# Patient Record
Sex: Male | Born: 1960
Health system: Southern US, Community
[De-identification: ages and names within clinical notes are randomized; demographics above are authoritative.]

## PROBLEM LIST (undated history)

## (undated) DIAGNOSIS — S060X9A Concussion with loss of consciousness of unspecified duration, initial encounter: Secondary | ICD-10-CM

## (undated) DIAGNOSIS — G43909 Migraine, unspecified, not intractable, without status migrainosus: Secondary | ICD-10-CM

## (undated) DIAGNOSIS — N184 Chronic kidney disease, stage 4 (severe): Secondary | ICD-10-CM

## (undated) DIAGNOSIS — N2581 Secondary hyperparathyroidism of renal origin: Secondary | ICD-10-CM

## (undated) DIAGNOSIS — K200 Eosinophilic esophagitis: Secondary | ICD-10-CM

## (undated) DIAGNOSIS — K259 Gastric ulcer, unspecified as acute or chronic, without hemorrhage or perforation: Secondary | ICD-10-CM

## (undated) DIAGNOSIS — N186 End stage renal disease: Secondary | ICD-10-CM

## (undated) DIAGNOSIS — E1122 Type 2 diabetes mellitus with diabetic chronic kidney disease: Secondary | ICD-10-CM

## (undated) DIAGNOSIS — N179 Acute kidney failure, unspecified: Secondary | ICD-10-CM

## (undated) DIAGNOSIS — Z992 Dependence on renal dialysis: Secondary | ICD-10-CM

## (undated) DIAGNOSIS — I4891 Unspecified atrial fibrillation: Secondary | ICD-10-CM

## (undated) DIAGNOSIS — K7460 Unspecified cirrhosis of liver: Secondary | ICD-10-CM

## (undated) DIAGNOSIS — S060XAA Concussion with loss of consciousness status unknown, initial encounter: Secondary | ICD-10-CM

## (undated) DIAGNOSIS — K219 Gastro-esophageal reflux disease without esophagitis: Secondary | ICD-10-CM

## (undated) DIAGNOSIS — Z87442 Personal history of urinary calculi: Secondary | ICD-10-CM

## (undated) DIAGNOSIS — I482 Chronic atrial fibrillation, unspecified: Secondary | ICD-10-CM

## (undated) DIAGNOSIS — Z8711 Personal history of peptic ulcer disease: Secondary | ICD-10-CM

## (undated) DIAGNOSIS — R1319 Other dysphagia: Secondary | ICD-10-CM

## (undated) DIAGNOSIS — I499 Cardiac arrhythmia, unspecified: Secondary | ICD-10-CM

## (undated) DIAGNOSIS — M1990 Unspecified osteoarthritis, unspecified site: Secondary | ICD-10-CM

## (undated) DIAGNOSIS — D649 Anemia, unspecified: Secondary | ICD-10-CM

## (undated) HISTORY — PX: NEPHROLITHOTOMY: SUR881

## (undated) HISTORY — PX: FINGER SURGERY: SHX640

## (undated) HISTORY — DX: Eosinophilic esophagitis: K20.0

## (undated) HISTORY — PX: HERNIA REPAIR: SHX51

## (undated) HISTORY — DX: Type 2 diabetes mellitus with diabetic chronic kidney disease: E11.22

## (undated) HISTORY — DX: Unspecified cirrhosis of liver: K74.60

## (undated) HISTORY — PX: LITHOTRIPSY: SUR834

## (undated) HISTORY — DX: Gastric ulcer, unspecified as acute or chronic, without hemorrhage or perforation: K25.9

## (undated) HISTORY — PX: CYSTOSCOPY: SUR368

## (undated) HISTORY — PX: KNEE ARTHROSCOPY: SUR90

## (undated) HISTORY — DX: Unspecified atrial fibrillation: I48.91

## (undated) HISTORY — PX: DENTAL SURGERY: SHX609

## (undated) HISTORY — DX: End stage renal disease: N18.6

## (undated) HISTORY — DX: End stage renal disease: Z99.2

## (undated) HISTORY — DX: Type 2 diabetes mellitus with diabetic chronic kidney disease: N18.4

## (undated) HISTORY — DX: Personal history of peptic ulcer disease: Z87.11

## (undated) HISTORY — DX: Gastro-esophageal reflux disease without esophagitis: K21.9

## (undated) HISTORY — PX: TONSILLECTOMY: SUR1361

## (undated) HISTORY — DX: Secondary hyperparathyroidism of renal origin: N25.81

## (undated) HISTORY — DX: Acute kidney failure, unspecified: N17.9

---

## 1998-03-31 ENCOUNTER — Emergency Department (HOSPITAL_COMMUNITY): Admission: EM | Admit: 1998-03-31 | Discharge: 1998-03-31 | Payer: Self-pay | Admitting: Emergency Medicine

## 2000-04-26 ENCOUNTER — Encounter: Payer: Self-pay | Admitting: Orthopedic Surgery

## 2000-04-26 ENCOUNTER — Ambulatory Visit (HOSPITAL_COMMUNITY): Admission: RE | Admit: 2000-04-26 | Discharge: 2000-04-26 | Payer: Self-pay | Admitting: Orthopedic Surgery

## 2001-09-08 ENCOUNTER — Encounter: Admission: RE | Admit: 2001-09-08 | Discharge: 2001-09-08 | Payer: Self-pay | Admitting: *Deleted

## 2001-09-08 ENCOUNTER — Encounter: Payer: Self-pay | Admitting: *Deleted

## 2001-09-29 ENCOUNTER — Ambulatory Visit (HOSPITAL_COMMUNITY): Admission: RE | Admit: 2001-09-29 | Discharge: 2001-09-29 | Payer: Self-pay | Admitting: Orthopedic Surgery

## 2001-09-29 ENCOUNTER — Encounter: Payer: Self-pay | Admitting: Orthopedic Surgery

## 2005-03-19 ENCOUNTER — Ambulatory Visit (HOSPITAL_COMMUNITY): Admission: RE | Admit: 2005-03-19 | Discharge: 2005-03-19 | Payer: Self-pay | Admitting: Urology

## 2006-01-18 ENCOUNTER — Ambulatory Visit: Payer: Self-pay | Admitting: Gastroenterology

## 2006-01-21 ENCOUNTER — Ambulatory Visit: Payer: Self-pay | Admitting: Gastroenterology

## 2006-01-21 ENCOUNTER — Emergency Department (HOSPITAL_COMMUNITY): Admission: EM | Admit: 2006-01-21 | Discharge: 2006-01-21 | Payer: Self-pay | Admitting: Emergency Medicine

## 2010-06-11 ENCOUNTER — Emergency Department (HOSPITAL_COMMUNITY): Admission: EM | Admit: 2010-06-11 | Discharge: 2010-06-11 | Payer: Self-pay | Admitting: Emergency Medicine

## 2010-07-08 ENCOUNTER — Ambulatory Visit: Payer: Self-pay | Admitting: Cardiology

## 2010-07-08 ENCOUNTER — Encounter: Payer: Self-pay | Admitting: Physician Assistant

## 2010-07-08 DIAGNOSIS — F341 Dysthymic disorder: Secondary | ICD-10-CM

## 2010-07-08 DIAGNOSIS — R002 Palpitations: Secondary | ICD-10-CM

## 2010-07-24 ENCOUNTER — Encounter: Payer: Self-pay | Admitting: Cardiology

## 2010-07-24 ENCOUNTER — Ambulatory Visit: Payer: Self-pay | Admitting: Cardiology

## 2010-07-24 ENCOUNTER — Ambulatory Visit (HOSPITAL_COMMUNITY): Admission: RE | Admit: 2010-07-24 | Discharge: 2010-07-24 | Payer: Self-pay | Admitting: Cardiology

## 2010-07-24 ENCOUNTER — Ambulatory Visit: Payer: Self-pay

## 2010-08-01 ENCOUNTER — Encounter: Payer: Self-pay | Admitting: Cardiology

## 2010-08-01 DIAGNOSIS — I10 Essential (primary) hypertension: Secondary | ICD-10-CM | POA: Insufficient documentation

## 2010-08-04 ENCOUNTER — Ambulatory Visit: Payer: Self-pay | Admitting: Cardiology

## 2010-12-23 NOTE — Assessment & Plan Note (Signed)
Summary: [name]/PALP   Visit Type:  Initial Consult  CC:  palpitations.  History of Present Illness: This is a 50 year old white male patient who went to the emergency room complaining of palpitations. His potassium was 3.0 but this was not replaced. Cardiac enzymes were negative and he was placed on metoprolol.  The patient states he is under an unknown amount of stress. He is going through divorce and his parents help has not been well. He says his heart began to pound and race and it was happening at night. He is actually sleeping better and has not had as much pounding. He does drink 3-4 sodas a day with caffeine. He does not have a history of hypertension diabetes family history of coronary artery disease and he is a nonsmoker. His blood pressure is up a little bit in the office today.  The patient denies any exertional chest pain dyspnea dyspnea on exertion dizziness or presyncope. He does not exercise regular. He said when he went to the hospital he had some tightness with a pounding heart but this was unusual.  The patient stopped taking the metoprolol 2 weeks after was initiated because he said it was not making any difference.  Current Medications (verified): 1)  Patient Does Not Take Any Medications  Allergies (verified): No Known Drug Allergies  Past History:  Past Medical History: Last updated: 07/03/2010 1.  Extensive kidney stones. He has undergone two serial surgical      extractions of stone at [facility] in the summer of 2006. Status      post many lithotripsies. His urologist in [place] is Dr. Gaynelle Ayala      but his surgeries at [facility] this past summer.  2.  Status post hernia repair as a 53-year-old. He is not even sure where the      hernia was but it was an abdominal hernia or a groin hernia of some      sort.  3.  Status post left knee arthroscopy twice.  Review of Systems       see history of present illness  Vital Signs:  Patient  profile:   50 year old male Height:      70 inches Weight:      225 pounds BMI:     32.40 Pulse rate:   80 / minute Pulse rhythm:   regular Resp:     18 per minute BP sitting:   142 / 86  (left arm) Cuff size:   large  Vitals Entered By: Jeffrey Ayala (July 08, 2010 10:34 AM)  Physical Exam  General:   Well-nournished, in no acute distress. Neck: No JVD, HJR, Bruit, or thyroid enlargement Lungs: No tachypnea, clear without wheezing, rales, or rhonchi Cardiovascular: RRR, PMI not displaced, heart sounds normal, no murmurs, gallops, bruit, thrill, or heave. Abdomen: BS normal. Soft without organomegaly, masses, lesions or tenderness. Extremities: without cyanosis, clubbing or edema. Good distal pulses bilateral SKin: Warm, no lesions or rashes  Musculoskeletal: No deformities Neuro: no focal signs    EKG  Procedure date:  07/08/2010  Findings:      normal sinus rhythm with LVH  Impression & Recommendations:  Problem # 1:  PALPITATIONS (ICD-785.1) Patient has had palpitations since he is going through divorce and his parents health has been poor. He is under a tremendous amount of stress. I offered him in November a quarter but he wants to hold off at this time. He says his palpitations are less than they had  been. Orders: EKG w/ Interpretation (93000)  Problem # 2:  ESSENTIAL HYPERTENSION, BENIGN (ICD-401.1) Patient is not treated for hypertension although his blood pressure is elevated. We will check a 2-D echo. I asked him to keep track of his blood pressures and bring them with him at his next office visit. Orders: Echocardiogram (Echo)  Patient Instructions: 1)  Your physician recommends that you schedule a follow-up appointment in: 1 month with Dr. Ron Ayala 2)  Your physician has requested that you have an echocardiogram.  Echocardiography is a painless test that uses sound waves to create images of your heart. It provides your doctor with information about the size  and shape of your heart and how well your heart's chambers and valves are working.  This procedure takes approximately one hour. There are no restrictions for this procedure. 3)  Your physician has requested that you limit the intake of sodium (salt) in your diet to two grams daily. Please see [facility] handout. 4)  Take your blood pressure at home regularly. 5)  Decrease coffee intake.

## 2010-12-23 NOTE — Miscellaneous (Signed)
  Clinical Lists Changes  Problems: Added new problem of * AORTIC ROOT DILATATION Removed problem of ESSENTIAL HYPERTENSION, BENIGN (ICD-401.1) Added new problem of HYPERTENSION (ICD-401.9) Observations: Added new observation of PAST MED HX: 1.  Extensive kidney stones. He has undergone two serial surgical      extractions of stone at [facility] in the summer of 2006. Status      post many lithotripsies. His urologist in [place] is Dr. Gaynelle Arabian      but his surgeries at [facility] this past summer.  2.  Status post hernia repair as a 52-year-old. He is not even sure where the      hernia was but it was an abdominal hernia or a groin hernia of some      sort.  3.  Status post left knee arthroscopy twice. Palpitations Potassium   3.0.Marland KitchenMarland KitchenMarland Kitchen emergency room... Home stress Hypertension EF 55-60%.... echo.... July 24, 2010.... mild LVH Aortic root   mild dilatation.... echo.... September, 2011 ( 40 mm) (08/01/2010 15:10)       Past History:  Past Medical History: 1.  Extensive kidney stones. He has undergone two serial surgical      extractions of stone at [facility] in the summer of 2006. Status      post many lithotripsies. His urologist in [place] is Dr. Gaynelle Arabian      but his surgeries at [facility] this past summer.  2.  Status post hernia repair as a 50-year-old. He is not even sure where the      hernia was but it was an abdominal hernia or a groin hernia of some      sort.  3.  Status post left knee arthroscopy twice. Palpitations Potassium   3.0.Marland KitchenMarland KitchenMarland Kitchen emergency room... Home stress Hypertension EF 55-60%.... echo.... July 24, 2010.... mild LVH Aortic root   mild dilatation.... echo.... September, 2011 ( 40 mm)

## 2010-12-23 NOTE — Assessment & Plan Note (Signed)
Summary: per checkout/saf   Visit Type:  Follow-up Primary Provider:  none  CC:  palpitations.  History of Present Illness: The patient is seen for cardiology followup.  He had been seen in the emergency room.  He had palpitations.  He was seen in our office on July 08, 2010.  He had been under a great deal of stress and has had excess caffeine.  He stabilized.  Holter monitor was discussed but never done.  He had a two-dimensional echo since his last visit.  Study shows an ejection fraction 55-60% with no wall motion abnormalities.  There was trivial aortic regurgitation.  Aortic was top normal at 40 mm.  Current Medications (verified): 1)  Patient Does Not Take Any Medications  Allergies (verified): No Known Drug Allergies  Past History:  Past Medical History: 1.  Extensive kidney stones. He has undergone two serial surgical      extractions of stone at [facility] in the summer of 2006. Status      post many lithotripsies. His urologist in [place] is Dr. Gaynelle Arabian      but his surgeries at [facility] this past summer.  2.  Status post hernia repair as a 50-year-old. He is not even sure where the      hernia was but it was an abdominal hernia or a groin hernia of some      sort.  3.  Status post left knee arthroscopy twice. Palpitations Potassium   3.0.Marland KitchenMarland KitchenMarland Kitchen emergency room... Home stress Hypertension EF 55-60%.... echo.... July 24, 2010.... mild LVH Aortic root   mild dilatation.... echo.... September, 2011 ( 40 mm)  Review of Systems       Patient denies fever, chills, headache, sweats, rash, change in vision, change in hearing, chest pain, cough, nausea vomiting, urinary symptoms.  All other systems are reviewed and are negative.  Vital Signs:  Patient profile:   50 year old male Height:      70 inches Weight:      231 pounds BMI:     33.26 Pulse rate:   80 / minute BP sitting:   134 / 80  (left arm) Cuff size:   regular  Vitals Entered By:  Mignon Pine, RMA (August 04, 2010 9:38 AM)  Physical Exam  General:  patient is stable. Eyes:  no xanthelasma. Neck:  no jugular venous distention. Lungs:  lungs are clear.  Respiratory effort is nonlabored. Heart:  cardiac exam reveals S1-S2.  No clicks or significant murmurs. Abdomen:  abdomen soft. Extremities:  no peripheral edema. Psych:  patient is oriented to person time and place.  Affect is normal.   Impression & Recommendations:  Problem # 1:  HYPERTENSION (ICD-401.9) Blood  pressures stable.  No change in therapy.  Problem # 2:  * AORTIC ROOT DILATATION The patient's aortic root is top normal at 40 mm.  No further workup needed at this time.  Followup echo could be considered in 5 years.  Problem # 3:  PALPITATIONS (ICD-785.1) The patient's palpitations are better.  We believe that they were stress related.  Echo has been done he has normal left ventricular function.  No further workup.

## 2011-02-07 LAB — CBC
MCH: 31.2 pg (ref 26.0–34.0)
MCHC: 34.1 g/dL (ref 30.0–36.0)
Platelets: 270 10*3/uL (ref 150–400)
RDW: 13.7 % (ref 11.5–15.5)

## 2011-02-07 LAB — POCT I-STAT, CHEM 8
Glucose, Bld: 107 mg/dL — ABNORMAL HIGH (ref 70–99)
HCT: 43 % (ref 39.0–52.0)
Hemoglobin: 14.6 g/dL (ref 13.0–17.0)
Potassium: 3 mEq/L — ABNORMAL LOW (ref 3.5–5.1)
Sodium: 142 mEq/L (ref 135–145)

## 2011-02-07 LAB — DIFFERENTIAL
Basophils Absolute: 0 10*3/uL (ref 0.0–0.1)
Basophils Relative: 0 % (ref 0–1)
Eosinophils Absolute: 0.1 10*3/uL (ref 0.0–0.7)
Monocytes Relative: 7 % (ref 3–12)
Neutro Abs: 5.3 10*3/uL (ref 1.7–7.7)
Neutrophils Relative %: 75 % (ref 43–77)

## 2011-02-07 LAB — POCT CARDIAC MARKERS
CKMB, poc: <1 ng/mL — ABNORMAL LOW (ref 1.0–8.0)
CKMB, poc: <1 ng/mL — ABNORMAL LOW (ref 1.0–8.0)

## 2011-04-10 NOTE — Op Note (Signed)
NAME:  Jeffrey Ayala, Jeffrey Ayala NO.:  [id number]   MEDICAL RECORD NO.:  [id number]          PATIENT TYPE:  INP   LOCATION:  1825                         FACILITY:  [facility]   PHYSICIAN:  Fenton Malling. Lucia Gaskins, M.D.  DATE OF BIRTH:  1961-10-30   DATE OF PROCEDURE:  01/21/2006  DATE OF DISCHARGE:                                 OPERATIVE REPORT   PREOPERATIVE DIAGNOSIS:  Right posterior perianal abscess.   POSTOPERATIVE DIAGNOSIS:  Right posterior perianal abscess.   PROCEDURE:  Incision and drainage of perianal abscess.   ANESTHESIA:  9 cc of 1% local.   COMPLICATIONS:  None.   INDICATIONS FOR PROCEDURE:  Jeffrey Ayala is a 50 year old white male who has  had worsening perirectal pain for about two weeks and presents to the [facility] emergency room with worsening perirectal pain.  On physical exam, he  has a ballotable abscess in the right posterior rectum.  He was actually  admitted by Dr. Fuller Plan for [facility].  I was asked to see him for a surgical  consult.  I feel it would best to have this I&D'd, and I think he can  probably go home afterwards.   DESCRIPTION OF PROCEDURE:  With the patient in the left lateral decubitus  position, his right posterior buttocks was painted with Betadine solution  and sterilely draped.  I infiltrated the skin with about 9 cc of 1%  Xylocaine.   I then made an incision about 2.5 to 3 cm, cutting out about a nickel-shape  of skin and drained an abscess a little bit bigger than a golf ball.  I got  about 30 cc's of pus out.   I irrigated this out and put some Betadine-soaked packing in it.  He will  remove the packing tomorrow, start Sitz baths three to four times a day, and  see me back in the office within one week.      Fenton Malling. Lucia Gaskins, M.D.  Electronically Signed     DHN/MEDQ  D:  01/21/2006  T:  01/22/2006  Job:  [id number]   cc:   Pricilla Riffle. Fuller Plan, M.D. [facility]  [address]  [place]  [place] [address]   Bernerd Limbo, M.D.  [facility]

## 2011-04-10 NOTE — Consult Note (Signed)
NAME:  Jeffrey Ayala, MINKS NO.:  [id number]   MEDICAL RECORD NO.:  [id number]          PATIENT TYPE:  INP   LOCATION:  1825                         FACILITY:  [facility]   PHYSICIAN:  Fenton Malling. Lucia Gaskins, M.D.  DATE OF BIRTH:  [birth year]   DATE OF CONSULTATION:  DATE OF DISCHARGE:                                   CONSULTATION   REASON FOR CONSULTATION:  Perirectal abscess.   HISTORY OF PRESENT ILLNESS:  Mr. Siconolfi is a 50 year old white male who has  no real identified primary medical doctor.  He usually goes to the urgent  care for any kind of medical problems.  About two weeks ago, he had what he  describes as a hard stool.  He started developing some perirectal pain.  This past Monday, which will be the 26th of February, he went to [facility], where he saw Dr. Coletta Memos.  He was given some medicines but did  not get any better.  Then he saw Dr. Oretha Caprice with [facility].  They gave  him some cream, which made the pain feel a little better, but then the pain  seemed to worsen.  Last night, he came to the [facility] emergency room.  He  was seen by Dr. Lucio Edward.  He has had a CT scan ordered.  They have  written admission orders on him.  They asked [place] to see him for possible  perirectal abscess.   The patient has no prior history of peptic ulcer disease, liver disease,  pancreatic disease, or colon disease.  His only prior abdominal surgery, he  had an inguinal hernia as a child, and he had open removal of kidney stones  at [place]:  No allergies.   He is on no medicines except for what he has been given for his acute  disease.   REVIEW OF SYSTEMS:  NEUROLOGIC:  No seizures, loss of consciousness.  PULMONARY:  No history of lung disease.  CARDIAC:  No chest pain.  GASTROINTESTINAL:  See history of present illness.  UROLOGIC:  He has had prior kidney stones that were taken care at [facility] but no recent problems.   His wife is with him.  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   PHYSICAL EXAMINATION:  VITAL SIGNS:  His temperature is 100.9, blood  pressure 132/78, pulse 93, respirations 18.  GENERAL:  He is a well-nourished, pleasant white male who is alert and  cooperative on physical exam.  LUNGS:  Clear to auscultation.  HEART:  Regular rate and rhythm without murmur or rub.  ABDOMEN:  Soft.  He has no tenderness, guarding, or rebound.  RECTUM:  His right posterior rectum, he has a ballottable area about 3-4 cm  in diameter, consistent with a perianal abscess.   I reviewed his CT scan  which does show a perianal/perirectal abscess with  surrounding inflammation.   My plan is to do an incision and drainage in the emergency room.   DIAGNOSES:  1.  Right  posterior perianal abscess.  2.  History of kidney stones.  3.  Multiple left knee surgeries.   PLAN:  I&D of the abscess.  If this goes well, I think he can probably be  sent home tonight.      Fenton Malling. Lucia Gaskins, M.D.  Electronically Signed     DHN/MEDQ  D:  01/21/2006  T:  01/21/2006  Job:  [id number]   cc:   Bernerd Limbo, M.D.  Fax: [phone]   Pricilla Riffle. Fuller Plan, M.D. [facility]  [address]  [place] [address]

## 2011-04-10 NOTE — H&P (Signed)
NAME:  Jeffrey Ayala, Jeffrey Ayala NO.:  [id number]   MEDICAL RECORD NO.:  [id number]          PATIENT TYPE:  INP   LOCATION:  [place]                         FACILITY:  [facility]   PHYSICIAN:  Malcolm T. Fuller Plan, M.D. [facility] OF BIRTH:  November 21, 1961   DATE OF ADMISSION:  01/21/2006  DATE OF DISCHARGE:  01/21/2006                                HISTORY & PHYSICAL   CHIEF COMPLAINT:  Painful swelling of the rectal and buttock area along with  fever this morning.   HISTORY OF PRESENT ILLNESS:  Mr. Ponzio is a generally healthy 50 year old  white male whose most remarkable past medical history is that of extensive  nephrolithiasis. He has had a remote colonoscopy because of diarrhea that  occurred after a kidney procedure many years ago. He said that there was not  a whole lot that was found at that time to explain the diarrhea. About two  weeks ago, the patient passed a particularly hard stool and passed some  blood after doing this. The bleeding was not extensive and it resolved  within a day or two. However, within the next few days and over the ensuing  next couple of weeks, he developed rectal pain which progressed. The pain  occurs when he has a bowel movement. His normal is to have one two soft,  easily moved movements a day; however, since all of this started, it has  caused renewed discomfort of the rectum and feels like it is tearing his  anus. On the January 17, 2006, Mr. Jeung went to see [facility]let[facility]os [facility] A CBC there was normal and on rectal exam was inconsequential. He was  referred and saw Dr. Ardis Hughs on the January 18, 2006.   On rectal exam, he had tenderness. No induration, no visible hemorrhage or  fissure. A full digital exam was not done because of exquisite tenderness.  He was sent home with a plan to treat with AnaMantle gel twice a day, sitz  baths three to four times a day and to continue Vicodin as needed but he  also could use Advil or Tylenol as  needed.   The patient felt a little bit better with Dr. Ardis Hughs' prescription. However,  early this morning, he woke up and felt feverish. The rectal pain was  recurrent. When he took a shower today, he felt a hard area near his rectum.  He was sent to the emergency room for evaluation by Dr. Ardis Hughs' office. The  patient did take a Vicodin this morning which did help alleviate but not  resolve the rectal pain.   ALLERGIES:  None.   MEDICATIONS:  1.  AnaMantle gel twice a day.  2.  Vicodin p.r.n.  3.  Advil and Tylenol p.r.n.   PAST MEDICAL HISTORY:  1.  Extensive kidney stones. He has undergone two serial surgical      extractions of stone at [facility] in the summer of 2006. Status      post many lithotripsies. His urologist in [place] is Dr. Gaynelle Arabian      but his surgeries at [facility] this  past summer.  2.  Status post hernia repair as a 65-year-old. He is not even sure where the      hernia was but it was an abdominal hernia or a groin hernia of some      sort.  3.  Status post left knee arthroscopy twice.   SOCIAL HISTORY:  The patient lives with his wife in [place]. They have  one child. 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 Occasionally drinks alcohol socially but none recently.  He does not smoke.   FAMILY HISTORY:  Mother is morbidly obese, has diabetes and hypertension.   REVIEW OF SYSTEMS:  No chest pain, no shortness of breath, no cough, no  urinary troubles. No nausea or vomiting. No abdominal pain, just the rectal  pain. No extremity edema. No headaches. No focal neurologic weakness. No  unusual bleeding or unexplained bruising. No gait disturbances or falls.  Weight is stable within five pounds.   LABORATORY DATA:  Hemoglobin is 14.7, hematocrit is 42, white blood count  12.6, platelets 324,000. Sodium 140, potassium 3.7, chloride 102, CO2 29.  BUN is 6, creatinine 1.2. Glucose 117. CT scan of the  pelvis has been  ordered but has not been completed.   PHYSICAL EXAMINATION:  VITAL SIGNS: Temperature is 100.9, blood pressure  132/78, pulse is 93, respirations 18. Room air saturation 100%.  GENERAL: The patient is a moderately ill and uncomfortable looking white  male.  HEENT: Exam without pallor. Extraocular movements intact. Oropharynx shows  mucosa is moist and clear.  NECK: No masses. No JVD. No thyromegaly.  CHEST: Clear to auscultation bilaterally. No shortness of breath and no  cough.  COR: There is a regular rate and rhythm but slightly tachycardic rate.  ABDOMEN: Soft, nontender, and nondistended with active bowel sounds. No  hepatosplenomegaly or masses.  RECTAL:  There is an erythematous and indurated region which is semilunar in  shape and surrounds the right side of the anus and moves into the buttocks.  At its widest point, it is probably about 8 cm by length of about 10 cm.  This is tender to the touch. There is no fluctuance noted. It is quite  indurated. Digital exam was not performed.  EXTREMITIES: No clubbing, cyanosis, or edema.  NEUROLOGICAL: The patient is a little bit groggy but he is alert and  oriented x3.   ASSESSMENT:  1.  Rule out perirectal abscess.  2.  History of extensive kidney stones, status post surgical removal as well      as lithotripsy.   PLAN:  Await CT scan of the pelvis. Admit for IV antibiotics and have spoken  with general surgery and will begin IV Unasyn at their suggestion. I have  also called for a general surgical evaluation as this may well require  surgical drainage.      Azucena Freed, P.A. [facility]      Malcolm T. Fuller Plan, M.D. [facility]  Electronically Signed    SG/MEDQ  D:  01/21/2006  T:  01/22/2006  Job:  [id number]

## 2012-04-01 ENCOUNTER — Encounter: Payer: Self-pay | Admitting: Gastroenterology

## 2014-02-24 ENCOUNTER — Ambulatory Visit (INDEPENDENT_AMBULATORY_CARE_PROVIDER_SITE_OTHER): Payer: BC Managed Care – PPO | Admitting: Emergency Medicine

## 2014-02-24 ENCOUNTER — Ambulatory Visit: Payer: BC Managed Care – PPO

## 2014-02-24 VITALS — BP 134/80 | HR 86 | Temp 97.8°F | Resp 18

## 2014-02-24 DIAGNOSIS — M79609 Pain in unspecified limb: Secondary | ICD-10-CM

## 2014-02-24 DIAGNOSIS — M79645 Pain in left finger(s): Secondary | ICD-10-CM

## 2014-02-24 DIAGNOSIS — S6710XA Crushing injury of unspecified finger(s), initial encounter: Secondary | ICD-10-CM

## 2014-02-24 MED ORDER — TRAMADOL HCL 50 MG PO TABS: 50.0000 mg | ORAL_TABLET | Freq: Three times a day (TID) | ORAL | Status: DC | PRN

## 2014-02-24 NOTE — Progress Notes (Addendum)
   Subjective:    Patient ID: Jeffrey Ayala, male    DOB: [birth year], 53 y.o.   MRN: [id number]  HPI This chart was scribed for Jeffrey Queen, MD by Thea Alken, ED Scribe. This patient was seen in room 6 and the patient's care was started at 12:05 PM.  HPI Comments: OMERO GOBBLE is a 53 y.o. male who presents to the [facility] complaining of a left  second digit injury onset prior to arrival. Pt reports that a trailer door came down and smashed his finger. Pt reports that his finger nail split. Pt cannot recall last TDP. Pt reports that he cannot take codeine. He reports that codeine makes him nervous. Pt report that he is able to take tramadol.    History reviewed. No pertinent past medical history. Allergies  Allergen Reactions  . Codeine     insomnia   Prior to Admission medications   Not on File   Review of Systems  Skin: Positive for wound.      Objective:   Physical Exam CONSTITUTIONAL: Well developed/well nourished HEAD: Normocephalic/atraumatic EYES: EOMI/PERRL ENMT: Mucous membranes moist NECK: supple no meningeal signs SPINE:entire spine nontender CV: S1/S2 noted, no murmurs/rubs/gallops noted LUNGS: Lungs are clear to auscultation bilaterally, no apparent distress ABDOMEN: soft, nontender, no rebound or guarding GU:no cva tenderness NEURO: Pt is awake/alert, moves all extremitiesx4 EXTREMITIES: pulses normal, full ROM the nail plate of the left second digit is completely Ables from the distal phalanx. There is significant swelling of the distal phalanx. There is a 1 cm laceration on the volar surface of the distal phalanx. SKIN: warm, color normal PSYCH: no abnormalities of mood noted   [facility] reading (PRIMARY) by  Dr. Everlene Farrier no fracture seen   Assessment & Plan:  Patient presents with a crush injury with nail avulsion. The may also be a nail matrix laceration with this. Last tetanus shot was 2001 this will be updated. TRAM was given for  pain relief  I personally performed the services described in this documentation, which was scribed in my presence. The recorded information has been reviewed and is accurate.

## 2014-02-24 NOTE — Progress Notes (Signed)
Procedure:  Consent obtained.  MC block with 2% lido.  Had to have additional lidocaine in a distal digital block for successful anesthesia.  The small laceration on the pad of the finger was cleaned and closed with 5-0 Ethilon #3 sutures. The nail was removed which revealed a macerated nail bed.  The nail bed was repaired with 5-0 vicryl.  Nail was placed onto the nail bed for splinting.  Pressure drsg was placed onto the wound.  He will recheck in 48h and not change the drsg until he sees me.  He will take medications as Rx.

## 2014-02-26 ENCOUNTER — Ambulatory Visit (INDEPENDENT_AMBULATORY_CARE_PROVIDER_SITE_OTHER): Payer: BC Managed Care – PPO | Admitting: Physician Assistant

## 2014-02-26 VITALS — BP 150/80 | HR 84 | Temp 98.1°F | Resp 16 | Ht 69.5 in | Wt 234.0 lb

## 2014-02-26 DIAGNOSIS — M79645 Pain in left finger(s): Secondary | ICD-10-CM

## 2014-02-26 DIAGNOSIS — M79609 Pain in unspecified limb: Secondary | ICD-10-CM

## 2014-02-26 DIAGNOSIS — S6710XA Crushing injury of unspecified finger(s), initial encounter: Secondary | ICD-10-CM

## 2014-02-26 NOTE — Progress Notes (Signed)
   Subjective:    Patient ID: Jeffrey Ayala, male    DOB: [birth year], 53 y.o.   MRN: [id number]  HPI Pt presents to clinic for recheck.  He had a lot of pain after he got home from the repair but he had little pain yesterday.  He has not taken off the dressing as instructed.    Review of Systems  Constitutional: Negative for fever and chills.  Skin: Positive for wound.       Objective:   Physical Exam  Vitals reviewed. Constitutional: He appears well-developed and well-nourished.  HENT:  Head: Normocephalic and atraumatic.  Right Ear: External ear normal.  Left Ear: External ear normal.  Pulmonary/Chest: Effort normal.  Skin: Skin is warm and dry.  Drsg removed with soaking.  Well healed wound on the finger pad and no erythema or other signs of infection on the finger.  Drsg replaced.  Psychiatric: He has a normal mood and affect. His behavior is normal. Judgment and thought content normal.      Assessment & Plan:  Crush injury to finger  Pain in finger of left hand  Suture removal in 7 days.  If any signs of infection RTC sooner.  Windell Hummingbird PA-C  [facility] 02/26/2014 8:43 AM

## 2014-03-05 ENCOUNTER — Ambulatory Visit (INDEPENDENT_AMBULATORY_CARE_PROVIDER_SITE_OTHER): Payer: BC Managed Care – PPO | Admitting: Physician Assistant

## 2014-03-05 VITALS — BP 130/90 | HR 80 | Temp 98.5°F | Resp 18 | Wt 234.0 lb

## 2014-03-05 DIAGNOSIS — Z4802 Encounter for removal of sutures: Secondary | ICD-10-CM

## 2014-03-05 NOTE — Progress Notes (Signed)
   Subjective:    Patient ID: COLBERT BUREK, male    DOB: March 08, 1961, 53 y.o.   MRN: [id number]  Suture / Staple Removal     Mr. Okubo is a very pleasant 53 yr old male here for suture removal.  He reports he has made some improvement, though the finger still throbs and he still has some disruption in sensation.  The finger remains a little swollen but has not been red or warm. No drainage from the wounds.  He continues to keep the finger covered   Review of Systems  Constitutional: Negative for fever and chills.  Skin: Positive for wound.       Objective:   Physical Exam  Vitals reviewed. Constitutional: He is oriented to person, place, and time. He appears well-developed and well-nourished. No distress.  HENT:  Head: Normocephalic and atraumatic.  Pulmonary/Chest: Effort normal.  Neurological: He is alert and oriented to person, place, and time.  Skin: Skin is warm and dry.  Healing wounds at left index finger; #3 sutures removed without difficulty; finger nail in place as splint; vicryl sutures appear to be in tact  Psychiatric: He has a normal mood and affect. His behavior is normal.       Assessment & Plan:  Encounter for removal of sutures   Mr. Topping is a very pleasant 53 yr old male here for suture removal.  Ethilon suture removed without difficulty.  Vicryl sutures remain intact beneath finger nail splint.  He continues to have some disruption in sensation at the finger tip but is not completely numb - we discussed that this will improve slowly.  We discussed that the nail will eventually fall off as the new nail grows in.  If he is not improving, or has concerns, he will RTC   E. Natividad Brood MHS, PA-C [facility] 4/13/20159:17 PM

## 2014-04-10 ENCOUNTER — Ambulatory Visit (INDEPENDENT_AMBULATORY_CARE_PROVIDER_SITE_OTHER): Payer: BC Managed Care – PPO | Admitting: Physician Assistant

## 2014-04-10 ENCOUNTER — Ambulatory Visit: Payer: BC Managed Care – PPO

## 2014-04-10 VITALS — BP 138/74 | HR 93 | Temp 97.8°F | Resp 18 | Ht 70.0 in | Wt 231.6 lb

## 2014-04-10 DIAGNOSIS — M79671 Pain in right foot: Secondary | ICD-10-CM

## 2014-04-10 DIAGNOSIS — M79609 Pain in unspecified limb: Secondary | ICD-10-CM

## 2014-04-10 DIAGNOSIS — M109 Gout, unspecified: Secondary | ICD-10-CM

## 2014-04-10 LAB — POCT CBC
GRANULOCYTE PERCENT: 72.6 % (ref 37–80)
HCT, POC: 46 % (ref 43.5–53.7)
HEMOGLOBIN: 15.2 g/dL (ref 14.1–18.1)
Lymph, poc: 1.3 (ref 0.6–3.4)
MCH: 30.6 pg (ref 27–31.2)
MCHC: 33 g/dL (ref 31.8–35.4)
MCV: 92.8 fL (ref 80–97)
MID (cbc): 0.4 (ref 0–0.9)
MPV: 7.3 fL (ref 0–99.8)
POC Granulocyte: 4.4 (ref 2–6.9)
POC LYMPH PERCENT: 21 %L (ref 10–50)
POC MID %: 6.4 % (ref 0–12)
Platelet Count, POC: 305 10*3/uL (ref 142–424)
RBC: 4.96 M/uL (ref 4.69–6.13)
RDW, POC: 15.9 %
WBC: 6 10*3/uL (ref 4.6–10.2)

## 2014-04-10 LAB — BASIC METABOLIC PANEL
BUN: 14 mg/dL (ref 6–23)
CHLORIDE: 100 meq/L (ref 96–112)
CO2: 25 mEq/L (ref 19–32)
Calcium: 9.4 mg/dL (ref 8.4–10.5)
Creat: 0.99 mg/dL (ref 0.50–1.35)
Glucose, Bld: 106 mg/dL — ABNORMAL HIGH (ref 70–99)
POTASSIUM: 3.7 meq/L (ref 3.5–5.3)
SODIUM: 138 meq/L (ref 135–145)

## 2014-04-10 LAB — URIC ACID: Uric Acid, Serum: 7.4 mg/dL (ref 4.0–7.8)

## 2014-04-10 MED ORDER — PREDNISONE 20 MG PO TABS: ORAL_TABLET | ORAL | Status: DC

## 2014-04-10 NOTE — Progress Notes (Signed)
Subjective:    Patient ID: Jeffrey Ayala, male    DOB: [birth year], 53 y.o.   MRN: [id number]  HPI Primary Physician: No PCP Per Patient  Chief Complaint: Knot on right great toe x 3 months, hurting now x 7 days   HPI: 53 y.o. male with history below presents with 3 month history of knot on right great toe. Knot now hurting x 7 days. No known injury or trauma. Patient was at bike week at [facility] [place] when the pain worsened. Pain, swelling, and erythema are located along medial aspect of the right Great toe just proximal to the MTP. Afebrile. No chills. Can move toe without issues. Contralateral foot is without issues.   Of note, patient with history of gout. Last flare a couple of years ago. Does not eat much shellfish or red meat. Eats mostly chicken. Drinks alcohol 1-2 drinks per day. Dad with history of gout as well.    No past medical history on file.   Home Meds: Prior to Admission medications   Not on File    Allergies:  Allergies  Allergen Reactions  . Codeine     insomnia    History   Social History  . Marital Status: Single    Spouse Name: N/A    Number of Children: N/A  . Years of Education: N/A   Occupational History  . Not on file.   Social History Main Topics  . Smoking status: Never Smoker   . Smokeless tobacco: Not on file  . Alcohol Use: Yes     Comment: occassional  . Drug Use: Not on file  . Sexual Activity: Not on file   Other Topics Concern  . Not on file   Social History Narrative  . No narrative on file     Review of Systems  Constitutional: Negative for fever, chills and fatigue.  Musculoskeletal: Positive for joint swelling. Negative for arthralgias, gait problem and myalgias.  Skin: Positive for color change. Negative for pallor, rash and wound.       Medial aspect of proximal right Great toe.        Objective:   Physical Exam  Physical Exam: Blood pressure 138/74, pulse 93, temperature 97.8 F (36.6 C), temperature  source Oral, resp. rate 18, height 5\' 10"  (1.778 m), weight 231 lb 9.6 oz (105.053 kg), SpO2 99.00%., Body mass index is 33.23 kg/(m^2). General: Well developed, well nourished, in no acute distress. Head: Normocephalic, atraumatic, eyes without discharge, sclera non-icteric, nares are without discharge.    Neck: Supple. Full ROM. Lungs: Breathing is unlabored. Heart: Regular rate. Msk:  Strength and tone normal for age. Extremities/Skin: Warm and dry. No clubbing or cyanosis. No edema. No rashes or suspicious lesions. Right Great toe: Swelling and mild erythema along the medial aspect just proximal to the MTP. TTP along the medial aspect of the erythema and swelling. Mild discomfort with flexion and extension of the Great toe. Otherwise no bony TTP and FROM throughout. Capillary refill less than 2 seconds. Distal pulses 2+.  Neuro: Alert and oriented X 3. Moves all extremities spontaneously. Gait is normal. CNII-XII grossly in tact. Psych:  Responds to questions appropriately with a normal affect.    [facility] reading (PRIMARY) by  Dr. Elder Cyphers. Hypercalcification along MTP with lucency proximal aspect as well as intraarticular c/w gouty changes. Spurring along 1st metatarsal.   Labs: Results for orders placed in visit on 04/10/14  POCT CBC      Result Value  Ref Range   WBC 6.0  4.6 - 10.2 K/uL   Lymph, poc 1.3  0.6 - 3.4   POC LYMPH PERCENT 21.0  10 - 50 %L   MID (cbc) 0.4  0 - 0.9   POC MID % 6.4  0 - 12 %M   POC Granulocyte 4.4  2 - 6.9   Granulocyte percent 72.6  37 - 80 %G   RBC 4.96  4.69 - 6.13 M/uL   Hemoglobin 15.2  14.1 - 18.1 g/dL   HCT, POC 46.0  43.5 - 53.7 %   MCV 92.8  80 - 97 fL   MCH, POC 30.6  27 - 31.2 pg   MCHC 33.0  31.8 - 35.4 g/dL   RDW, POC 15.9     Platelet Count, POC 305  142 - 424 K/uL   MPV 7.3  0 - 99.8 fL    BMP and uric acid pending    Assessment & Plan:  53 year old male with gout of the right Great toe and pain right Great toe -Prednisone 20 mg #18  3x3, 2x3, 1x3 no RF -Has pain medication at home if needed -Await labs -RTC 2 weeks for possible repeat uric acid   Christell Faith, MHS, PA-C [facility] [place], [place] [address] [address] 04/10/2014 9:48 AM

## 2014-06-26 ENCOUNTER — Encounter (HOSPITAL_COMMUNITY): Payer: Self-pay | Admitting: Pharmacy Technician

## 2014-06-26 ENCOUNTER — Other Ambulatory Visit: Payer: Self-pay | Admitting: Urology

## 2014-06-29 ENCOUNTER — Encounter (HOSPITAL_COMMUNITY): Payer: Self-pay | Admitting: *Deleted

## 2014-07-08 NOTE — H&P (Signed)
History of Present Illness        F/u -       1-nephrolithiasis - Patient has a long history of kidney stones. He required shockwave lithotripsy in 2006 on the left and a right PCNL at [facility].   -June 2015 - presented with back pain, chronic, worse on left x 1 week. KUB - right renal stones, left prox stone      Patient has a long history of kidney stones. He required shockwave lithotripsy in 2006 on the left and a right PCNL at [facility].       July 2015-interval history-  He returns to review the CT scan. This revealed a large right lower pole stone (12 mm x 2.5 cm, HU 1200, visible). A left proximal 7 x 9 mm left proximal stone (ssd 13 cm, HU 600, visible on scout) I reviewed all the images.    He's been well without fever or gross hematuria.       Past Medical History Problems  1. History of kidney stones (V13.01) 2. History of Staghorn Calculus (V13.01)  Surgical History Problems  1. History of Cystoscopy With Insertion Of Ureteral Stent Right 2. History of Inguinal Hernia Repair 3. History of Lithotripsy 4. History of Lithotripsy 5. History of Percutaneous Lithotomy 6. History of Tonsillectomy  Current Meds 1. Oxycodone-Acetaminophen 5-325 MG Oral Tablet; TAKE 1 TO 2 TABLETS EVERY 4 TO 6  HOURS AS NEEDED FOR PAIN;  Therapy: [phone] to (Evaluate:10Jul2015); Last Rx:26Jun2015 Ordered  Allergies Medication  1. Codeine Derivatives 2. Tamsulosin HCl CAPS 3. TraMADol HCl TABS  Family History Problems  1. Family history of Diabetes Mellitus (V18.0) : Mother 2. Family history of Family Health Status Number Of Children   1 son 3. Family history of Nephrolithiasis : Mother  Social History Problems  1. Caffeine use (V49.89) 2. Married 3. Never a smoker 4. Number of children 5. Occupation 6. Social alcohol use  Vitals Vital Signs [Data Includes: Last 1 Day]  Recorded: 29Jul2015 03:39PM  Blood Pressure: 168 /  96 Temperature: 98.5 F Heart Rate: 75  Physical Exam Constitutional: Well nourished and well developed . No acute distress.  Neuro/Psych:. Mood and affect are appropriate.    Results/Data Urine [Data Includes: Last 1 Day]   [id number]  COLOR YELLOW   APPEARANCE CLEAR   SPECIFIC GRAVITY <1.005   pH 6.5   GLUCOSE NEG mg/dL  BILIRUBIN NEG   KETONE NEG mg/dL  BLOOD SMALL   PROTEIN NEG mg/dL  UROBILINOGEN 0.2 mg/dL  NITRITE NEG   LEUKOCYTE ESTERASE TRACE   SQUAMOUS EPITHELIAL/HPF NONE SEEN   WBC 3-6 WBC/hpf  RBC 0-2 RBC/hpf  BACTERIA NONE SEEN   CRYSTALS NONE SEEN   CASTS NONE SEEN    Procedure      Assessment Assessed  1. Nephrolithiasis (592.0)  Plan Health Maintenance  1. UA With REFLEX; [Do Not Release]; Status:Complete;   [id number] 03:03PM Nephrolithiasis  2. URINE CULTURE; Status:Hold For - Specimen/Data Collection,Appointment; Requested  for:29Jul2015;  3. Follow-up Schedule Surgery Office  Follow-up  Status: Complete  Done: [id number]  Discussion/Summary Left ureteral stone-we discussed the nature risks benefits and alternatives to surveillance with continued passage, shockwave lithotripsy, ureteroscopy. All questions answered and he elects to proceed with shockwave lithotripsy. He's had shockwave lithotripsy in the past.    Right lower pole stone. We discussed this is a large stone in the major risk and benefits of surveillance, staged ureteroscopy, stage shockwave, PCNL. He does not  want PCNL. He will consider his options.     Signatures Electronically signed by : Festus Aloe, M.D.; Jun 20 2014  3:59PM EST

## 2014-07-09 ENCOUNTER — Encounter (HOSPITAL_COMMUNITY)
Admission: RE | Disposition: A | Discharge status: Home / Self Care | Payer: Self-pay | Source: Ambulatory Visit | Attending: Urology

## 2014-07-09 ENCOUNTER — Ambulatory Visit (HOSPITAL_COMMUNITY)
Admission: RE | Admit: 2014-07-09 | Discharge: 2014-07-09 | Disposition: A | Discharge status: Home / Self Care | Payer: BC Managed Care – PPO | Source: Ambulatory Visit | Attending: Urology | Admitting: Urology

## 2014-07-09 ENCOUNTER — Ambulatory Visit (HOSPITAL_COMMUNITY): Payer: BC Managed Care – PPO

## 2014-07-09 ENCOUNTER — Encounter (HOSPITAL_COMMUNITY): Payer: Self-pay | Admitting: *Deleted

## 2014-07-09 DIAGNOSIS — N201 Calculus of ureter: Secondary | ICD-10-CM | POA: Diagnosis present

## 2014-07-09 DIAGNOSIS — Z888 Allergy status to other drugs, medicaments and biological substances status: Secondary | ICD-10-CM | POA: Diagnosis not present

## 2014-07-09 DIAGNOSIS — N200 Calculus of kidney: Secondary | ICD-10-CM | POA: Insufficient documentation

## 2014-07-09 DIAGNOSIS — Z885 Allergy status to narcotic agent status: Secondary | ICD-10-CM | POA: Insufficient documentation

## 2014-07-09 DIAGNOSIS — Z87442 Personal history of urinary calculi: Secondary | ICD-10-CM | POA: Diagnosis not present

## 2014-07-09 HISTORY — DX: Concussion with loss of consciousness of unspecified duration, initial encounter: S06.0X9A

## 2014-07-09 HISTORY — DX: Concussion with loss of consciousness status unknown, initial encounter: S06.0XAA

## 2014-07-09 SURGERY — LITHOTRIPSY, ESWL
Anesthesia: LOCAL | Laterality: Left

## 2014-07-09 MED ORDER — TRAMADOL HCL 50 MG PO TABS: 50.0000 mg | ORAL_TABLET | Freq: Four times a day (QID) | ORAL | Status: DC | PRN

## 2014-07-09 MED ORDER — DIAZEPAM 5 MG PO TABS
10.0000 mg | ORAL_TABLET | ORAL | Status: AC
  Administered 2014-07-09: 10 mg via ORAL
  Filled 2014-07-09: qty 2

## 2014-07-09 MED ORDER — TAMSULOSIN HCL 0.4 MG PO CAPS: 0.4000 mg | ORAL_CAPSULE | Freq: Every day | ORAL | Status: DC

## 2014-07-09 MED ORDER — CIPROFLOXACIN HCL 500 MG PO TABS
500.0000 mg | ORAL_TABLET | ORAL | Status: AC
  Administered 2014-07-09: 500 mg via ORAL
  Filled 2014-07-09: qty 1

## 2014-07-09 MED ORDER — NAPROXEN SODIUM 220 MG PO TABS: 220.0000 mg | ORAL_TABLET | Freq: Two times a day (BID) | ORAL | Status: DC | PRN

## 2014-07-09 MED ORDER — DIPHENHYDRAMINE HCL 25 MG PO CAPS
25.0000 mg | ORAL_CAPSULE | ORAL | Status: AC
  Administered 2014-07-09: 25 mg via ORAL
  Filled 2014-07-09: qty 1

## 2014-07-09 MED ORDER — SODIUM CHLORIDE 0.9 % IV SOLN
INTRAVENOUS | Status: DC
  Administered 2014-07-09: 13:00:00 via INTRAVENOUS

## 2014-07-09 NOTE — Discharge Instructions (Signed)

## 2014-07-09 NOTE — Op Note (Signed)
See [facility] scanned op note --  S/p Left ESWL of left ureteral stone located left mid-ureter over sacrum on today's KUB.

## 2014-07-09 NOTE — Progress Notes (Signed)
Dr. Junious Silk called to get Rx. For nausea medication.

## 2014-07-09 NOTE — Interval H&P Note (Signed)
History and Physical Interval Note:  07/09/2014 2:03 PM  Jeffrey Ayala  has presented today for surgery, with the diagnosis of stone left ureteral  The various methods of treatment have been discussed with the patient and family. After consideration of risks, benefits and other options for treatment, the patient has consented to  Procedure(s): LEFT EXTRACORPOREAL SHOCK WAVE LITHOTRIPSY (ESWL) (Left) as a surgical intervention .  The patient's history has been reviewed, patient examined, no change in status, stable for surgery.  I have reviewed the patient's chart and labs.  Questions were answered to the patient's satisfaction.  On further review of KUB I can localize the stone over the left sacrum. I discussed with patient. He still wants to shock RIGHT side. He said he had a large one in the past he knew was too big for SWL, might not break up and he might not be able to pass all the fragments but he did well. Nonetheless, we discussed again the left side remains obstructed and we need to deal with this stone first. In addition, he is on the table and we can see the stone over the sacrum in two planes and it localizes normally.    Jeffrey Ayala

## 2014-08-27 ENCOUNTER — Other Ambulatory Visit: Payer: Self-pay | Admitting: Urology

## 2014-08-28 ENCOUNTER — Encounter (HOSPITAL_COMMUNITY): Payer: Self-pay | Admitting: Pharmacy Technician

## 2014-08-28 ENCOUNTER — Encounter (HOSPITAL_COMMUNITY): Payer: Self-pay | Admitting: *Deleted

## 2014-08-29 NOTE — H&P (Signed)
History of Present Illness        F/u -       1-nephrolithiasis - Patient has a long history of kidney stones. He required shockwave lithotripsy in 2006 on the left and a right PCNL at [facility].   -June 2015 - presented with back pain, chronic, worse on left x 1 week. KUB - right renal stones, left prox stone  -July 2015- CT A/p - this revealed a large right MP stone (12 mm, 11 cm SSD. 1250 HU, visible), a large RLP stone, 9 mm and a left proximal 7 x 9 mm left proximal stone (ssd 13 cm, HU 600, visible on scout) I reviewed all the images.    2-left ureteral stone - Aug 2015 - underwent left ESWL for left midpole stone (and a left proximal 7 x 9 mm left proximal stone (ssd 13 cm, HU 600, visible on scout  -Sept 2015 post eswl kub - Left ureteral stone resolve nicely. I don't appreciate any other left-sided stones. Stable pelvic phleboliths. 2 stones in the right kidney 1 about 15 mm and one about 9 mm. These are stable.        Oct 2015 interval hx  He returns to review metabolic evaluation. Stones were 80% uric acid, 20% calcium oxalate. His BMP calcium and PTH were normal. His uric acid was not drawn. He has been well. No flank pain or further stone passage. He does have a history of gout but he has been told his uric acid is 6 or 7.     He's had no fever or dysuria.   Past Medical History Problems  1. History of kidney stones (Z87.442) 2. History of Staghorn Calculus  Surgical History Problems  1. History of Cystoscopy With Insertion Of Ureteral Stent Right 2. History of Inguinal Hernia Repair 3. History of Lithotripsy 4. History of Lithotripsy 5. History of Lithotripsy 6. History of Percutaneous Lithotomy 7. History of Tonsillectomy  Current Meds 1. No Reported Medications Recorded  Allergies Medication  1. Codeine Derivatives 2. Tamsulosin HCl CAPS 3. TraMADol HCl TABS  Family History Problems  1. Family history of Diabetes Mellitus :  Mother 2. Family history of Family Health Status Number Of Children   1 son 3. Family history of Nephrolithiasis : Mother  Social History Problems  1. Caffeine use (F15.90) 2. Married 3. Never a smoker 4. Number of children 5. Occupation 6. Social alcohol use (F10.99)  Vitals Vital Signs [Data Includes: Last 1 Day]  Recorded: 05Oct2015 08:15AM  Blood Pressure: 167 / 95 Temperature: 98.1 F Heart Rate: 80  Physical Exam Constitutional: Well nourished and well developed . No acute distress.  Pulmonary: No respiratory distress and normal respiratory rhythm and effort.  Cardiovascular: Heart rate and rhythm are normal . No peripheral edema.  Neuro/Psych:. Mood and affect are appropriate.    Results/Data Urine [Data Includes: Last 1 Day]   05Oct2015  COLOR YELLOW   APPEARANCE CLEAR   SPECIFIC GRAVITY 1.020   pH 6.0   GLUCOSE NEG mg/dL  BILIRUBIN NEG   KETONE NEG mg/dL  BLOOD TRACE   PROTEIN TRACE mg/dL  UROBILINOGEN 0.2 mg/dL  NITRITE NEG   LEUKOCYTE ESTERASE SMALL   SQUAMOUS EPITHELIAL/HPF RARE   WBC 11-20 WBC/hpf  RBC 3-6 RBC/hpf  BACTERIA FEW   CRYSTALS NONE SEEN   CASTS NONE SEEN    Assessment Assessed  1. Nephrolithiasis (N20.0) 2. Bacteriuria, asymptomatic (N39.0)  Plan Bacteriuria, asymptomatic  1. URINE CULTURE; Status:Hold For -  Specimen/Data Collection,Appointment; Requested  [id number];  Health Maintenance  2. UA With REFLEX; [Do Not Release]; Status:Resulted - Requires Verification;   [id number] 07:59AM Nephrolithiasis  3. Follow-up Schedule Surgery Office  Follow-up  Status: Hold For - Appointment   Requested for: 05Oct2015  Discussion/Summary Right nephrolithiasis- discussed nature risks and benefits of continued surveillance, staged shock wave lithotripsy, staged ureteroscopy, PCNL. He is certain he does not want PCNL or ureteroscopy. He wants to proceed with shockwave lithotripsy and understands needs to be staged. We discussed  risk of failure to fragment, Steinstrasse and hematoma among others. I did send urine for culture as a precaution.      Signatures Electronically signed by : Festus Aloe, M.D.; Aug 27 2014  8:38AM EST

## 2014-08-30 ENCOUNTER — Encounter (HOSPITAL_COMMUNITY): Payer: Self-pay | Admitting: *Deleted

## 2014-08-30 ENCOUNTER — Encounter (HOSPITAL_COMMUNITY)
Admission: RE | Disposition: A | Discharge status: Home / Self Care | Payer: Self-pay | Source: Ambulatory Visit | Attending: Urology

## 2014-08-30 ENCOUNTER — Ambulatory Visit (HOSPITAL_COMMUNITY)
Admission: RE | Admit: 2014-08-30 | Discharge: 2014-08-30 | Disposition: A | Discharge status: Home / Self Care | Payer: BC Managed Care – PPO | Source: Ambulatory Visit | Attending: Urology | Admitting: Urology

## 2014-08-30 ENCOUNTER — Ambulatory Visit (HOSPITAL_COMMUNITY): Payer: BC Managed Care – PPO

## 2014-08-30 DIAGNOSIS — M109 Gout, unspecified: Secondary | ICD-10-CM | POA: Insufficient documentation

## 2014-08-30 DIAGNOSIS — Z888 Allergy status to other drugs, medicaments and biological substances status: Secondary | ICD-10-CM | POA: Diagnosis not present

## 2014-08-30 DIAGNOSIS — N200 Calculus of kidney: Secondary | ICD-10-CM | POA: Diagnosis present

## 2014-08-30 DIAGNOSIS — Z885 Allergy status to narcotic agent status: Secondary | ICD-10-CM | POA: Insufficient documentation

## 2014-08-30 SURGERY — LITHOTRIPSY, ESWL
Anesthesia: LOCAL | Laterality: Right

## 2014-08-30 MED ORDER — CIPROFLOXACIN HCL 500 MG PO TABS
500.0000 mg | ORAL_TABLET | ORAL | Status: AC
  Administered 2014-08-30: 500 mg via ORAL
  Filled 2014-08-30: qty 1

## 2014-08-30 MED ORDER — TAMSULOSIN HCL 0.4 MG PO CAPS: 0.4000 mg | ORAL_CAPSULE | Freq: Every day | ORAL | Status: DC

## 2014-08-30 MED ORDER — SODIUM CHLORIDE 0.9 % IV SOLN
INTRAVENOUS | Status: DC
  Administered 2014-08-30: 08:00:00 via INTRAVENOUS

## 2014-08-30 MED ORDER — DIAZEPAM 5 MG PO TABS
10.0000 mg | ORAL_TABLET | ORAL | Status: AC
  Administered 2014-08-30: 10 mg via ORAL
  Filled 2014-08-30: qty 2

## 2014-08-30 MED ORDER — OXYCODONE-ACETAMINOPHEN 5-325 MG PO TABS: 1.0000 | ORAL_TABLET | Freq: Four times a day (QID) | ORAL | Status: DC | PRN

## 2014-08-30 MED ORDER — DIPHENHYDRAMINE HCL 25 MG PO CAPS
25.0000 mg | ORAL_CAPSULE | ORAL | Status: AC
  Administered 2014-08-30: 25 mg via ORAL
  Filled 2014-08-30: qty 1

## 2014-08-30 NOTE — Op Note (Signed)
Right renal stone - large stone in right mid-pole  S/p right ESWL

## 2014-08-30 NOTE — Interval H&P Note (Signed)
History and Physical Interval Note:  08/30/2014 9:28 AM  Jeffrey Ayala  has presented today for surgery, with the diagnosis of RIGHT NEPHROLITHIASIS  The various methods of treatment have been discussed with the patient and family. After consideration of risks, benefits and other options for treatment, the patient has consented to  Procedure(s): RIGHT EXTRACORPOREAL SHOCK WAVE LITHOTRIPSY (ESWL) (Right) as a surgical intervention .  The patient's history has been reviewed, patient examined, no change in status, stable for surgery.  I have reviewed the patient's chart and labs.  Questions were answered to the patient's satisfaction.     Festus Aloe

## 2014-08-30 NOTE — Discharge Instructions (Signed)

## 2014-10-12 ENCOUNTER — Other Ambulatory Visit: Payer: Self-pay | Admitting: Urology

## 2014-10-12 ENCOUNTER — Encounter (HOSPITAL_COMMUNITY): Payer: Self-pay | Admitting: *Deleted

## 2014-10-15 ENCOUNTER — Encounter (HOSPITAL_COMMUNITY)
Admission: RE | Disposition: A | Discharge status: Home / Self Care | Payer: Self-pay | Source: Ambulatory Visit | Attending: Urology

## 2014-10-15 ENCOUNTER — Ambulatory Visit (HOSPITAL_COMMUNITY): Payer: BC Managed Care – PPO

## 2014-10-15 ENCOUNTER — Encounter (HOSPITAL_COMMUNITY): Payer: Self-pay | Admitting: *Deleted

## 2014-10-15 ENCOUNTER — Ambulatory Visit (HOSPITAL_COMMUNITY)
Admission: RE | Admit: 2014-10-15 | Discharge: 2014-10-15 | Disposition: A | Discharge status: Home / Self Care | Payer: BC Managed Care – PPO | Source: Ambulatory Visit | Attending: Urology | Admitting: Urology

## 2014-10-15 DIAGNOSIS — N200 Calculus of kidney: Secondary | ICD-10-CM

## 2014-10-15 SURGERY — LITHOTRIPSY, ESWL
Anesthesia: LOCAL | Laterality: Right

## 2014-10-15 MED ORDER — OXYCODONE-ACETAMINOPHEN 5-325 MG PO TABS: 1.0000 | ORAL_TABLET | Freq: Four times a day (QID) | ORAL | Status: DC | PRN

## 2014-10-15 MED ORDER — SODIUM CHLORIDE 0.9 % IV SOLN
INTRAVENOUS | Status: DC
  Administered 2014-10-15: 16:00:00 via INTRAVENOUS

## 2014-10-15 MED ORDER — DIAZEPAM 5 MG PO TABS
10.0000 mg | ORAL_TABLET | ORAL | Status: AC
  Administered 2014-10-15: 10 mg via ORAL
  Filled 2014-10-15: qty 2

## 2014-10-15 MED ORDER — DIPHENHYDRAMINE HCL 25 MG PO CAPS
25.0000 mg | ORAL_CAPSULE | ORAL | Status: AC
  Administered 2014-10-15: 25 mg via ORAL
  Filled 2014-10-15: qty 1

## 2014-10-15 MED ORDER — CIPROFLOXACIN HCL 500 MG PO TABS
500.0000 mg | ORAL_TABLET | ORAL | Status: AC
  Administered 2014-10-15: 500 mg via ORAL
  Filled 2014-10-15: qty 1

## 2014-10-15 NOTE — Op Note (Signed)
See [facility] scanned Op note -   S/p Right Renal ESWL - staged

## 2014-10-15 NOTE — H&P (Signed)
Active Problems Problems  1. Nephrolithiasis (N20.0)   Assessed By: Jimmey Ralph (Urology); Last Assessed: 12 Oct 2014  History of Present Illness 53 YO male patient of Dr. Lyndal Rainbow seen today for a 1 month f/u.     GU Hx:    1-nephrolithiasis - Patient has a long history of kidney stones. He required shockwave lithotripsy in 2006 on the left and a right PCNL at [facility].   -June 2015 - presented with back pain, chronic, worse on left x 1 week. KUB - right renal stones, left prox stone  -July 2015- CT A/p - this revealed a large right MP stone (12 mm, 11 cm SSD. 1250 HU, visible), a large RLP stone, 9 mm and a left proximal 7 x 9 mm left proximal stone (ssd 13 cm, HU 600, visible on scout) reviewed all the images.  -Oct 2015 ESWL right renal    2-left ureteral stone - Aug 2015 - underwent left ESWL for left midpole stone (and a left proximal 7 x 9 mm left proximal stone (ssd 13 cm, HU 600, visible on scout  -Sept 2015 post eswl kub - Left ureteral stone resolve nicely. I don't appreciate any other left-sided stones. Stable pelvic phleboliths. 2 stones in the right kidney 1 about 15 mm and one about 9 mm. These are stable.    Oct 2015 interval hx  He returns to review metabolic evaluation. Stones were 80% uric acid, 20% calcium oxalate. His BMP calcium and PTH were normal. His uric acid was not drawn. He has been well. No flank pain or further stone passage. He does have a history of gout but he has been told his uric acid is 6 or 7.     Nov 2015 Interval Hx:   Today states that he has done very well post procedure. Has passed large amount of stone material. Denies flank pain or hematuria at this time.   Vitals Vital Signs [Data Includes: Last 1 Day]  Recorded: 20Nov2015 08:29AM  Height: 5 ft 10 in Weight: 220 lb  BMI Calculated: 31.57 BSA Calculated: 2.17 Blood Pressure: 173 / 91, Sitting Temperature: 98 F, Oral Heart Rate: 79 Respiration:  16  Physical Exam Constitutional: Well nourished and well developed . No acute distress. The patient appears well hydrated.  Abdomen: The abdomen is flat. The abdomen is soft and nontender. No suprapubic tenderness. No CVA tenderness.    Results/Data Urine [Data Includes: Last 1 Day]   [id number]  COLOR YELLOW   APPEARANCE CLEAR   SPECIFIC GRAVITY 1.025   pH 5.5   GLUCOSE NEG mg/dL  BILIRUBIN NEG   KETONE NEG mg/dL  BLOOD NEG   PROTEIN NEG mg/dL  UROBILINOGEN 0.2 mg/dL  NITRITE NEG   LEUKOCYTE ESTERASE NEG    The following images/tracing/specimen were independently visualized:  KUB: shows that right renal calculus still present but there has been excellent fragmentation of stone.  The following clinical lab reports were reviewed:  UA- negative pH: 5.5.    Assessment Assessed  1. Nephrolithiasis (N20.0)  Plan Nephrolithiasis  1. Stone Analysis; Status:Hold For - Specimen/Data Collection,Appointment; Requested  for:20Nov2015;   Stones sent for analysis  Will have Dr. Junious Silk call pt to discuss possible need for repeat ESWL (R)   Signatures Electronically signed by : Jimmey Ralph, Trey Paula; Oct 12 2014  8:44AM EST

## 2014-10-15 NOTE — Discharge Instructions (Signed)

## 2014-10-15 NOTE — Interval H&P Note (Signed)
History and Physical Interval Note:  10/15/2014 4:15 PM  [name]  has presented today for surgery, with the diagnosis of RIGHT RENAL STONES  The various methods of treatment have been discussed with the patient and family. After consideration of risks, benefits and other options for treatment, the patient has consented to  Procedure(s): RIGHT EXTRACORPOREAL SHOCK WAVE LITHOTRIPSY (ESWL) (Right) as a surgical intervention .  The patient's history has been reviewed, patient examined, no change in status, stable for surgery.  I have reviewed the patient's chart and labs.  Questions were answered to the patient's satisfaction.  I came to see patient in short stay prior to coming on the truck. He's passed a lot of fragments and has some stone left in the right mid and LP. Discussed focus on largest fragment and possible staged procedure. He's been well. No dysuria or fever.    Festus Aloe

## 2016-04-01 ENCOUNTER — Emergency Department (HOSPITAL_COMMUNITY)
Admission: EM | Admit: 2016-04-01 | Discharge: 2016-04-01 | Disposition: A | Discharge status: Home / Self Care | Payer: Self-pay | Attending: Emergency Medicine | Admitting: Emergency Medicine

## 2016-04-01 ENCOUNTER — Encounter (HOSPITAL_COMMUNITY): Payer: Self-pay | Admitting: Emergency Medicine

## 2016-04-01 ENCOUNTER — Emergency Department (HOSPITAL_COMMUNITY): Payer: Self-pay

## 2016-04-01 DIAGNOSIS — S29002A Unspecified injury of muscle and tendon of back wall of thorax, initial encounter: Secondary | ICD-10-CM | POA: Insufficient documentation

## 2016-04-01 DIAGNOSIS — M545 Low back pain, unspecified: Secondary | ICD-10-CM

## 2016-04-01 DIAGNOSIS — Z8679 Personal history of other diseases of the circulatory system: Secondary | ICD-10-CM | POA: Insufficient documentation

## 2016-04-01 DIAGNOSIS — Y92410 Unspecified street and highway as the place of occurrence of the external cause: Secondary | ICD-10-CM | POA: Insufficient documentation

## 2016-04-01 DIAGNOSIS — S0990XA Unspecified injury of head, initial encounter: Secondary | ICD-10-CM | POA: Insufficient documentation

## 2016-04-01 DIAGNOSIS — S3992XA Unspecified injury of lower back, initial encounter: Secondary | ICD-10-CM | POA: Insufficient documentation

## 2016-04-01 DIAGNOSIS — Z87442 Personal history of urinary calculi: Secondary | ICD-10-CM | POA: Insufficient documentation

## 2016-04-01 DIAGNOSIS — Y9389 Activity, other specified: Secondary | ICD-10-CM | POA: Insufficient documentation

## 2016-04-01 DIAGNOSIS — M79622 Pain in left upper arm: Secondary | ICD-10-CM

## 2016-04-01 DIAGNOSIS — S4992XA Unspecified injury of left shoulder and upper arm, initial encounter: Secondary | ICD-10-CM | POA: Insufficient documentation

## 2016-04-01 DIAGNOSIS — Y998 Other external cause status: Secondary | ICD-10-CM | POA: Insufficient documentation

## 2016-04-01 MED ORDER — NAPROXEN 250 MG PO TABS: 250.0000 mg | ORAL_TABLET | Freq: Two times a day (BID) | ORAL | Status: DC

## 2016-04-01 MED ORDER — METHOCARBAMOL 500 MG PO TABS: 500.0000 mg | ORAL_TABLET | Freq: Two times a day (BID) | ORAL | Status: DC | PRN

## 2016-04-01 MED ORDER — OXYCODONE-ACETAMINOPHEN 5-325 MG PO TABS
2.0000 | ORAL_TABLET | Freq: Once | ORAL | Status: AC
  Administered 2016-04-01: 2 via ORAL
  Filled 2016-04-01: qty 2
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
## 2016-04-01 NOTE — ED Notes (Signed)
Patient transported to X-ray 
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
## 2016-06-22 ENCOUNTER — Encounter: Payer: Self-pay | Admitting: Physician Assistant

## 2016-06-22 DIAGNOSIS — N200 Calculus of kidney: Secondary | ICD-10-CM | POA: Insufficient documentation

## 2016-12-13 ENCOUNTER — Emergency Department (HOSPITAL_COMMUNITY): Payer: BLUE CROSS/BLUE SHIELD

## 2016-12-13 ENCOUNTER — Emergency Department (HOSPITAL_COMMUNITY)
Admission: EM | Admit: 2016-12-13 | Discharge: 2016-12-13 | Disposition: A | Discharge status: Home / Self Care | Payer: BLUE CROSS/BLUE SHIELD | Attending: Emergency Medicine | Admitting: Emergency Medicine

## 2016-12-13 ENCOUNTER — Encounter (HOSPITAL_COMMUNITY): Payer: Self-pay | Admitting: Emergency Medicine

## 2016-12-13 ENCOUNTER — Emergency Department (HOSPITAL_BASED_OUTPATIENT_CLINIC_OR_DEPARTMENT_OTHER)
Admit: 2016-12-13 | Discharge: 2016-12-13 | Disposition: A | Discharge status: Home / Self Care | Payer: BLUE CROSS/BLUE SHIELD | Attending: Physician Assistant | Admitting: Physician Assistant

## 2016-12-13 DIAGNOSIS — Y999 Unspecified external cause status: Secondary | ICD-10-CM | POA: Diagnosis not present

## 2016-12-13 DIAGNOSIS — S86911A Strain of unspecified muscle(s) and tendon(s) at lower leg level, right leg, initial encounter: Secondary | ICD-10-CM | POA: Diagnosis not present

## 2016-12-13 DIAGNOSIS — I10 Essential (primary) hypertension: Secondary | ICD-10-CM | POA: Diagnosis not present

## 2016-12-13 DIAGNOSIS — M79609 Pain in unspecified limb: Secondary | ICD-10-CM | POA: Diagnosis not present

## 2016-12-13 DIAGNOSIS — S8992XA Unspecified injury of left lower leg, initial encounter: Secondary | ICD-10-CM | POA: Diagnosis present

## 2016-12-13 DIAGNOSIS — Y929 Unspecified place or not applicable: Secondary | ICD-10-CM | POA: Insufficient documentation

## 2016-12-13 DIAGNOSIS — Y939 Activity, unspecified: Secondary | ICD-10-CM | POA: Insufficient documentation

## 2016-12-13 DIAGNOSIS — Z79899 Other long term (current) drug therapy: Secondary | ICD-10-CM | POA: Diagnosis not present

## 2016-12-13 DIAGNOSIS — S86912A Strain of unspecified muscle(s) and tendon(s) at lower leg level, left leg, initial encounter: Secondary | ICD-10-CM

## 2016-12-13 DIAGNOSIS — W01198A Fall on same level from slipping, tripping and stumbling with subsequent striking against other object, initial encounter: Secondary | ICD-10-CM | POA: Insufficient documentation

## 2016-12-13 DIAGNOSIS — M7989 Other specified soft tissue disorders: Secondary | ICD-10-CM

## 2016-12-13 LAB — BASIC METABOLIC PANEL
Anion gap: 8 (ref 5–15)
BUN: 11 mg/dL (ref 6–20)
CO2: 27 mmol/L (ref 22–32)
Calcium: 9.4 mg/dL (ref 8.9–10.3)
Chloride: 104 mmol/L (ref 101–111)
Creatinine, Ser: 0.95 mg/dL (ref 0.61–1.24)
GFR calc Af Amer: >60 mL/min (ref >60)
GFR calc non Af Amer: >60 mL/min (ref >60)
Glucose, Bld: 118 mg/dL — ABNORMAL HIGH (ref 65–99)
POTASSIUM: 3.4 mmol/L — AB (ref 3.5–5.1)
Sodium: 139 mmol/L (ref 135–145)

## 2016-12-13 LAB — CBC
HCT: 42.9 % (ref 39.0–52.0)
HEMOGLOBIN: 14.6 g/dL (ref 13.0–17.0)
MCH: 29.8 pg (ref 26.0–34.0)
MCHC: 34 g/dL (ref 30.0–36.0)
MCV: 87.6 fL (ref 78.0–100.0)
Platelets: 227 10*3/uL (ref 150–400)
RBC: 4.9 MIL/uL (ref 4.22–5.81)
RDW: 14.6 % (ref 11.5–15.5)
WBC: 7 10*3/uL (ref 4.0–10.5)

## 2016-12-13 LAB — D-DIMER, QUANTITATIVE (NOT AT ARMC): D DIMER QUANT: 0.52 ug{FEU}/mL — AB (ref 0.00–0.50)
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
## 2016-12-13 NOTE — Progress Notes (Signed)
*  PRELIMINARY RESULTS* Vascular Ultrasound Bilateral lower extremity venous duplex has been completed.  Preliminary findings: No evidence of deep vein thrombosis or baker's cysts bilaterally.  Jeffrey Ayala 12/13/2016, 3:17 PM

## 2016-12-13 NOTE — ED Provider Notes (Signed)
[facility] DEPT Provider Note   CSN: [id number] Arrival date & time: 12/13/16  1158     History   Chief Complaint Chief Complaint  Patient presents with  . Leg Pain    HPI Jeffrey Ayala is a 56 y.o. male.  Pt presents to the ED with left leg pain.  The pt said that he fell and hit his left knee on Tuesday, January 16th.  The pt said that he tore up his pants, but did not think he did not hurt himself.  The pt said that he noticed swelling to his left leg yesterday.  He denies any cp or sob.      Past Medical History:  Diagnosis Date  . Concussion   . Headache(784.0)    Hx. Migrains  . Renal stone    Hx. multiple kidne;y stones    Patient Active Problem List   Diagnosis Date Noted  . Kidney stones 06/22/2016  . HYPERTENSION 08/01/2010  . DYSTHYMIC DISORDER 07/08/2010  . PALPITATIONS 07/08/2010    Past Surgical History:  Procedure Laterality Date  . cystos with stents    . DENTAL SURGERY    . FINGER SURGERY    . HERNIA REPAIR     at age 25  . KNEE ARTHROSCOPY Left   . LITHOTRIPSY    . lithotripsy  07/2014  . NEPHROLITHOTOMY    . TONSILLECTOMY         Home Medications    Prior to Admission medications   Medication Sig Start Date End Date Taking? Authorizing Provider  acetaminophen (TYLENOL) 650 MG suppository Place 1,300 mg rectally every 4 (four) hours as needed for mild pain.    Historical Provider, MD  diphenhydramine-acetaminophen (TYLENOL PM) 25-500 MG TABS tablet Take 1 tablet by mouth at bedtime as needed (Sleep).    Historical Provider, MD  methocarbamol (ROBAXIN) 500 MG tablet Take 1 tablet (500 mg total) by mouth 2 (two) times daily as needed for muscle spasms. 04/01/16   Waynetta Pean, PA-C  naproxen (NAPROSYN) 250 MG tablet Take 1 tablet (250 mg total) by mouth 2 (two) times daily with a meal. 04/01/16   Waynetta Pean, PA-C    Family History No family history on file.  Social History Social History  Substance Use Topics  .  Smoking status: Never Smoker  . Smokeless tobacco: Not on file  . Alcohol use Yes     Comment: occassional     Allergies   Codeine and Flomax [tamsulosin hcl]   Review of Systems Review of Systems  Musculoskeletal:       Left leg swelling  All other systems reviewed and are negative.    Physical Exam Updated Vital Signs BP 146/87   Pulse 68   Temp 98.9 F (37.2 C) (Oral)   Resp 16   SpO2 97%   Physical Exam  Constitutional: He is oriented to person, place, and time. He appears well-developed and well-nourished.  HENT:  Head: Normocephalic and atraumatic.  Right Ear: External ear normal.  Left Ear: External ear normal.  Nose: Nose normal.  Mouth/Throat: Oropharynx is clear and moist.  Eyes: Conjunctivae and EOM are normal. Pupils are equal, round, and reactive to light.  Neck: Normal range of motion. Neck supple.  Cardiovascular: Normal rate, regular rhythm, normal heart sounds and intact distal pulses.   Pulmonary/Chest: Effort normal and breath sounds normal.  Abdominal: Soft. Bowel sounds are normal.  Musculoskeletal:  Left lower leg swelling.  Mild calf tenderness.  Left knee  mild tenderness.  Neurological: He is alert and oriented to person, place, and time.  Skin: Skin is warm.  Psychiatric: He has a normal mood and affect. His behavior is normal. Judgment and thought content normal.  Nursing note and vitals reviewed.    ED Treatments / Results  Labs (all labs ordered are listed, but only abnormal results are displayed) Labs Reviewed  D-DIMER, QUANTITATIVE (NOT AT [facility]) - Abnormal; Notable for the following:       Result Value   D-Dimer, Quant 0.52 (*)    All other components within normal limits  BASIC METABOLIC PANEL - Abnormal; Notable for the following:    Potassium 3.4 (*)    Glucose, Bld 118 (*)    All other components within normal limits  CBC    EKG  EKG Interpretation None       Radiology Dg Tibia/fibula Left  Result Date:  12/13/2016 CLINICAL DATA:  Fall 5 days ago onto brick steps. Left knee/lower leg pain, tightness, and swelling. Initial encounter. EXAM: LEFT TIBIA AND FIBULA - 2 VIEW COMPARISON:  None. FINDINGS: The tibia and fibula appear intact without evidence of fracture. The knee and ankle are located. Detailed assessment of the knee is deferred to concurrent dedicated knee radiographs. No focal soft tissue abnormality. IMPRESSION: No acute osseous abnormality. Electronically Signed   By: Logan Bores M.D.   On: 12/13/2016 18:04   Dg Knee Complete 4 Views Left  Result Date: 12/13/2016 CLINICAL DATA:  Fall 5 days ago onto brick steps. Left knee/lower leg pain, tightness, and swelling. Initial encounter. EXAM: LEFT KNEE - COMPLETE 4+ VIEW COMPARISON:  None. FINDINGS: There is no evidence of acute fracture dislocation. There is small knee joint effusion. Mild tricompartmental marginal osteophyte formation and joint space narrowing are noted. Chondrocalcinosis is noted in the medial greater than lateral compartments. IMPRESSION: 1. No evidence of acute osseous abnormality. 2. Small knee joint effusion. 3. Mild tricompartmental osteoarthrosis. 4. Medial and lateral compartment chondrocalcinosis. Electronically Signed   By: Logan Bores M.D.   On: 12/13/2016 18:03    Procedures Procedures (including critical care time)  Medications Ordered in ED Medications - No data to display   Initial Impression / Assessment and Plan / ED Course  I have reviewed the triage vital signs and the nursing notes.  Pertinent labs & imaging results that were available during my care of the patient were reviewed by me and considered in my medical decision making (see chart for details).    Pt's swelling is likely from a knee injury he sustained a few days ago.  The pt does not have much pain.  He was offered a knee immobilizer, but declines.  He knows to f/u with ortho.  Return if worse.  Final Clinical Impressions(s) / ED Diagnoses    Final diagnoses:  Left leg swelling  Knee strain, left, initial encounter    New Prescriptions New Prescriptions   No medications on file     Isla Pence, MD 12/13/16 1820

## 2016-12-17 ENCOUNTER — Ambulatory Visit (INDEPENDENT_AMBULATORY_CARE_PROVIDER_SITE_OTHER): Payer: BLUE CROSS/BLUE SHIELD | Admitting: Family

## 2016-12-17 VITALS — Ht 71.0 in | Wt 220.0 lb

## 2016-12-17 DIAGNOSIS — M1712 Unilateral primary osteoarthritis, left knee: Secondary | ICD-10-CM

## 2016-12-17 DIAGNOSIS — M25562 Pain in left knee: Secondary | ICD-10-CM | POA: Diagnosis not present

## 2016-12-17 NOTE — Progress Notes (Signed)
Office Visit Note   Patient: Jeffrey Ayala           Date of Birth: 04-03-1961           MRN: [id number] Visit Date: 12/17/2016              Requested by: No referring provider defined for this encounter. PCP: No PCP Per Patient  Chief Complaint  Patient presents with  . Left Knee - Pain, Injury    DOI 12/08/16 s/p fall    HPI: Patient sustained a fall on 12/08/16. He went to the ER for eval on 12/13/16 where x rays were obtained and were negative for fracture. Ultrasound was also negative for DVT.  Patient does not complain of knee pain but rather calf pain. He states that it is not tender to touch and it is not red but it is swollen and feels warm. Patient c/o tightness in the calf and that it feels like it is "pulling" he is taking tylenol arthritis and aleve alternating the two daily. He is full weight bearing. Pamella Pert, RMA  The patient is a 56 year old gentleman who is seen today for evaluation of left knee pain as well as left calf pain. He sustained a fall on 12/08/2016. This was evaluated in the emergency room a few days later. Radiographs are negative as well as a Doppler was negative for DVT. He is complaining of medial knee pain that extends down to the calf. Complaining of swelling in the calf. States the emergency room told him the swelling was coming from his knee. Does complain of tightness in the calf with ambulation. No history of blood clots. Him states that he knows he has bone-on-bone arthritis and has been recommended to have total knee arthroplasty in the past. Has been trying to put this off as long as possible.    Assessment & Plan: Visit Diagnoses:  1. Acute pain of left knee   2. Primary osteoarthritis of left knee     Plan: doubt DVT as dopplers were negative. knee Injection Today. He will continue with ibuprofen or Aleve for him pain. Ice for the knee. Have recommended that he use compression. He will obtain a high compression stockings [place]  [facility] for him [facility].   Follow-Up Instructions: Return in about 4 weeks (around 01/14/2017).   Physical Exam  Constitutional: Appears well-developed.  Head: Normocephalic.  Eyes: EOM are normal.  Neck: Normal range of motion.  Cardiovascular: Normal rate.   Pulmonary/Chest: Effort normal.  Neurological: Is alert.  Skin: Skin is warm.  Psychiatric: Has a normal mood and affect.  Left Knee Exam   Tenderness  The patient is experiencing tenderness in the medial joint line.  Range of Motion  The patient has normal left knee ROM. Left knee flexion: pain with flexion.   Tests  Varus: negative Valgus: negative  Other  Swelling: moderate Effusion: effusion present  Comments:  Mild effusion    Left calf with pitting edema and diffuse tenderness. No erythema. No warmth. No palpable cords.   Imaging: No results found.  Orders:  No orders of the defined types were placed in this encounter.  No orders of the defined types were placed in this encounter.    Procedures: No procedures performed  Clinical Data: No additional findings.  Subjective: Review of Systems  Constitutional: Negative for chills and fever.  Cardiovascular: Positive for leg swelling.  Musculoskeletal: Positive for arthralgias, joint swelling and myalgias.  Objective: Vital Signs: Ht 5\' 11"  (1.803 m)   Wt 220 lb (99.8 kg)   BMI 30.68 kg/m   Specialty Comments:  No specialty comments available.  PMFS History: Patient Active Problem List   Diagnosis Date Noted  . Kidney stones 06/22/2016  . HYPERTENSION 08/01/2010  . DYSTHYMIC DISORDER 07/08/2010  . PALPITATIONS 07/08/2010   Past Medical History:  Diagnosis Date  . Concussion   . Headache(784.0)    Hx. Migrains  . Renal stone    Hx. multiple kidne;y stones    No family history on file.  Past Surgical History:  Procedure Laterality Date  . cystos with stents    . DENTAL SURGERY    . FINGER SURGERY    .  HERNIA REPAIR     at age 45  . KNEE ARTHROSCOPY Left   . LITHOTRIPSY    . lithotripsy  07/2014  . NEPHROLITHOTOMY    . TONSILLECTOMY     Social History   Occupational History  . Not on file.   Social History Main Topics  . Smoking status: Never Smoker  . Smokeless tobacco: Not on file  . Alcohol use Yes     Comment: occassional  . Drug use: No  . Sexual activity: Not on file

## 2017-01-14 ENCOUNTER — Ambulatory Visit (INDEPENDENT_AMBULATORY_CARE_PROVIDER_SITE_OTHER): Payer: BLUE CROSS/BLUE SHIELD | Admitting: Orthopedic Surgery

## 2017-01-14 ENCOUNTER — Encounter (INDEPENDENT_AMBULATORY_CARE_PROVIDER_SITE_OTHER): Payer: Self-pay | Admitting: Orthopedic Surgery

## 2017-01-14 VITALS — Ht 71.0 in | Wt 220.0 lb

## 2017-01-14 DIAGNOSIS — S86112S Strain of other muscle(s) and tendon(s) of posterior muscle group at lower leg level, left leg, sequela: Secondary | ICD-10-CM | POA: Insufficient documentation

## 2017-01-14 NOTE — Progress Notes (Signed)
   Office Visit Note   Patient: Jeffrey Ayala           Date of Birth: Mar 07, 1961           MRN: [id number] Visit Date: 01/14/2017              Requested by: No referring provider defined for this encounter. PCP: No PCP Per Patient  Chief Complaint  Patient presents with  . Left Knee - Follow-up    HPI: Patient is a 56 y.o male who presents today for 4 week follow up left leg. He has noticed a decrease in swelling overall. He complains of prominent knot posterior calf. He complains of dull aching pain. He is currently taking Aleve and tylenol. Maxcine Ham, RT    Assessment & Plan: Visit Diagnoses: No diagnosis found.  Plan: Continue compression stocking for the left leg increase activities as tolerated no restrictions.  Follow-Up Instructions: No Follow-up on file.   Ortho Exam Examination patient is alert oriented no adenopathy well-dressed normal affect normal respiratory effort. There is no pitting edema over the tibial tubercle he has significant improvement in the swelling. He still has some tenderness to palpation over the medial head of the gastrocnemius muscle. His Achilles tendon is intact. Patient has no pain with dorsiflexion of the ankle no pain to palpation over the greater saphenous vein no signs or symptoms of DVT. Patient has resolving symptoms from sprain of the medial head of the gastrocnemius muscle.  Imaging: No results found.  Orders:  No orders of the defined types were placed in this encounter.  No orders of the defined types were placed in this encounter.    Procedures: No procedures performed  Clinical Data: No additional findings.  Subjective: Review of Systems  Objective: Vital Signs: Ht 5\' 11"  (1.803 m)   Wt 220 lb (99.8 kg)   BMI 30.68 kg/m   Specialty Comments:  No specialty comments available.  PMFS History: Patient Active Problem List   Diagnosis Date Noted  . Kidney stones 06/22/2016  . HYPERTENSION 08/01/2010  .  DYSTHYMIC DISORDER 07/08/2010  . PALPITATIONS 07/08/2010   Past Medical History:  Diagnosis Date  . Concussion   . Headache(784.0)    Hx. Migrains  . Renal stone    Hx. multiple kidne;y stones    History reviewed. No pertinent family history.  Past Surgical History:  Procedure Laterality Date  . cystos with stents    . DENTAL SURGERY    . FINGER SURGERY    . HERNIA REPAIR     at age 40  . KNEE ARTHROSCOPY Left   . LITHOTRIPSY    . lithotripsy  07/2014  . NEPHROLITHOTOMY    . TONSILLECTOMY     Social History   Occupational History  . Not on file.   Social History Main Topics  . Smoking status: Never Smoker  . Smokeless tobacco: Never Used  . Alcohol use Yes     Comment: occassional  . Drug use: No  . Sexual activity: Not on file

## 2017-03-03 ENCOUNTER — Ambulatory Visit (INDEPENDENT_AMBULATORY_CARE_PROVIDER_SITE_OTHER): Payer: BLUE CROSS/BLUE SHIELD | Admitting: Family

## 2017-03-03 ENCOUNTER — Ambulatory Visit (INDEPENDENT_AMBULATORY_CARE_PROVIDER_SITE_OTHER): Payer: Self-pay

## 2017-03-03 ENCOUNTER — Encounter (INDEPENDENT_AMBULATORY_CARE_PROVIDER_SITE_OTHER): Payer: Self-pay | Admitting: Family

## 2017-03-03 VITALS — Ht 71.0 in | Wt 220.0 lb

## 2017-03-03 DIAGNOSIS — M1732 Unilateral post-traumatic osteoarthritis, left knee: Secondary | ICD-10-CM | POA: Diagnosis not present

## 2017-03-03 DIAGNOSIS — M1711 Unilateral primary osteoarthritis, right knee: Secondary | ICD-10-CM

## 2017-03-03 DIAGNOSIS — M25561 Pain in right knee: Secondary | ICD-10-CM

## 2017-03-03 DIAGNOSIS — G8929 Other chronic pain: Secondary | ICD-10-CM

## 2017-03-03 MED ORDER — METHYLPREDNISOLONE ACETATE 40 MG/ML IJ SUSP
40.0000 mg | INTRAMUSCULAR | Status: AC | PRN
  Administered 2017-03-03: 40 mg via INTRA_ARTICULAR

## 2017-03-03 MED ORDER — LIDOCAINE HCL 1 % IJ SOLN
5.0000 mL | INTRAMUSCULAR | Status: AC | PRN
  Administered 2017-03-03: 5 mL

## 2017-03-03 NOTE — Progress Notes (Addendum)
Office Visit Note   Patient: Jeffrey Ayala           Date of Birth: 1960-11-29           MRN: [id number] Visit Date: 03/03/2017              Requested by: No referring provider defined for this encounter. PCP: No PCP Per Patient  Chief Complaint  Patient presents with  . Right Knee - Pain  . Left Knee - Pain      HPI: The patient is a 56 year old gentleman who presents today for evaluation of bilateral knee pain. Left much more painful than right. Complains of medial pain bilaterally states his knees do give out at times. He is fallen once. Complains of decreased range of motion due to pain complains of intermittent swelling. Pain is worse with activities and ambulation. He states he takes 2 Aleve into arthritis Tylenol each morning which is no longer helpful. Does have some tramadol which she takes in the evening which she states does work well.  Did have a cortisone injection to the left knee about 5 weeks ago states this was not helpful.  Assessment & Plan: Visit Diagnoses:  1. Chronic pain of right knee   2. Post-traumatic osteoarthritis of left knee   3. Primary osteoarthritis of right knee     Plan: Depo-Medrol injection bilateral knees. Discussed hyaluronic acid. Patient will like to proceed with prior authorization of these for bilateral knees if does not get good relief from the cortisone injections. The patient will call in ask [place] to order this if he does not get good relief from cortisone today. Discussed total knee replacement which she like to proceed with. States he would like to start with the left knee will let us know when he is ready to set this up.  Follow-Up Instructions: Return if symptoms worsen or fail to improve.   Right Knee Exam   Tenderness  The patient is experiencing tenderness in the medial joint line.  Range of Motion  The patient has normal right knee ROM.  Tests  Varus: negative Valgus: negative  Other  Swelling: none   Left Knee  Exam   Tenderness  The patient is experiencing tenderness in the medial joint line and lateral joint line.  Range of Motion  The patient has normal left knee ROM.  Tests  Varus: negative Valgus: negative  Other  Swelling: mild Effusion: no effusion present      Patient is alert, oriented, no adenopathy, well-dressed, normal affect, normal respiratory effort.   Imaging: Xr Knee 1-2 Views Right  Result Date: 03/03/2017 Radiographs of the right knee show medial joint space narrowing. There is bone on bone contact left knee medially with lateral joint space narrowing.    Labs: Lab Results  Component Value Date   LABURIC 7.4 04/10/2014    Orders:  Orders Placed This Encounter  Procedures  . XR Knee 1-2 Views Right   No orders of the defined types were placed in this encounter.    Procedures: Large Joint Inj Date/Time: 03/03/2017 11:21 AM Performed by: Dondra Prader R Authorized by: Dondra Prader R   Consent Given by:  Patient Site marked: the procedure site was marked   Timeout: prior to procedure the correct patient, procedure, and site was verified   Indications:  Pain and diagnostic evaluation Location:  Knee Site:  R knee Needle Size:  22 G Needle Length:  1.5 inches Ultrasound Guidance: No  Fluoroscopic Guidance: No   Arthrogram: No   Medications:  5 mL lidocaine 1 %; 40 mg methylPREDNISolone acetate 40 MG/ML Aspiration Attempted: No   Patient tolerance:  Patient tolerated the procedure well with no immediate complications Large Joint Inj Date/Time: 03/03/2017 11:22 AM Performed by: Suzan Slick Authorized by: Dondra Prader R   Consent Given by:  Patient Site marked: the procedure site was marked   Timeout: prior to procedure the correct patient, procedure, and site was verified   Indications:  Pain and diagnostic evaluation Location:  Knee Site:  L knee Needle Size:  22 G Needle Length:  1.5 inches Ultrasound Guidance: No   Fluoroscopic  Guidance: No   Arthrogram: No   Medications:  5 mL lidocaine 1 %; 40 mg methylPREDNISolone acetate 40 MG/ML Aspiration Attempted: No   Patient tolerance:  Patient tolerated the procedure well with no immediate complications    Clinical Data: No additional findings.  ROS: Review of Systems  Constitutional: Negative for chills and fever.  Musculoskeletal: Positive for arthralgias and joint swelling.    Objective: Vital Signs: Ht 5\' 11"  (1.803 m)   Wt 220 lb (99.8 kg)   BMI 30.68 kg/m   Specialty Comments:  No specialty comments available.  PMFS History: Patient Active Problem List   Diagnosis Date Noted  . Rupture of medial head of gastrocnemius, left, sequela 01/14/2017  . Kidney stones 06/22/2016  . HYPERTENSION 08/01/2010  . DYSTHYMIC DISORDER 07/08/2010  . PALPITATIONS 07/08/2010   Past Medical History:  Diagnosis Date  . Concussion   . Headache(784.0)    Hx. Migrains  . Renal stone    Hx. multiple kidne;y stones    No family history on file.  Past Surgical History:  Procedure Laterality Date  . cystos with stents    . DENTAL SURGERY    . FINGER SURGERY    . HERNIA REPAIR     at age 48  . KNEE ARTHROSCOPY Left   . LITHOTRIPSY    . lithotripsy  07/2014  . NEPHROLITHOTOMY    . TONSILLECTOMY     Social History   Occupational History  . Not on file.   Social History Main Topics  . Smoking status: Never Smoker  . Smokeless tobacco: Never Used  . Alcohol use Yes     Comment: occassional  . Drug use: No  . Sexual activity: Not on file

## 2017-03-20 ENCOUNTER — Emergency Department (HOSPITAL_COMMUNITY): Payer: BLUE CROSS/BLUE SHIELD

## 2017-03-20 ENCOUNTER — Encounter (HOSPITAL_COMMUNITY): Payer: Self-pay | Admitting: Emergency Medicine

## 2017-03-20 ENCOUNTER — Emergency Department (HOSPITAL_COMMUNITY)
Admission: EM | Admit: 2017-03-20 | Discharge: 2017-03-20 | Disposition: A | Discharge status: Home / Self Care | Payer: BLUE CROSS/BLUE SHIELD | Attending: Emergency Medicine | Admitting: Emergency Medicine

## 2017-03-20 DIAGNOSIS — Y999 Unspecified external cause status: Secondary | ICD-10-CM | POA: Diagnosis not present

## 2017-03-20 DIAGNOSIS — S61311A Laceration without foreign body of left index finger with damage to nail, initial encounter: Secondary | ICD-10-CM

## 2017-03-20 DIAGNOSIS — W293XXA Contact with powered garden and outdoor hand tools and machinery, initial encounter: Secondary | ICD-10-CM | POA: Diagnosis not present

## 2017-03-20 DIAGNOSIS — I10 Essential (primary) hypertension: Secondary | ICD-10-CM | POA: Diagnosis not present

## 2017-03-20 DIAGNOSIS — Y9389 Activity, other specified: Secondary | ICD-10-CM | POA: Diagnosis not present

## 2017-03-20 DIAGNOSIS — Y929 Unspecified place or not applicable: Secondary | ICD-10-CM | POA: Diagnosis not present

## 2017-03-20 DIAGNOSIS — Z79899 Other long term (current) drug therapy: Secondary | ICD-10-CM | POA: Insufficient documentation

## 2017-03-20 MED ORDER — TRAMADOL HCL 50 MG PO TABS: 50.0000 mg | ORAL_TABLET | Freq: Four times a day (QID) | ORAL | 0 refills | Status: DC | PRN

## 2017-03-20 MED ORDER — LIDOCAINE HCL (PF) 1 % IJ SOLN
5.0000 mL | Freq: Once | INTRAMUSCULAR | Status: AC
  Administered 2017-03-20: 5 mL via INTRADERMAL
  Filled 2017-03-20: qty 5

## 2017-03-20 NOTE — ED Notes (Signed)
Pt slipped while using chain saw, laceration to left index finger. Bleeding controlled at this time.

## 2017-03-20 NOTE — Discharge Instructions (Signed)
Suture removal in 7-8 days  

## 2017-03-20 NOTE — ED Notes (Signed)
Declined W/C at D/C and was escorted to lobby by RN. 

## 2017-03-20 NOTE — ED Provider Notes (Signed)
[place] DEPT Provider Note   CSN: [id number] Arrival date & time: 03/20/17  1006  By signing my name below, I, Higinio Plan, attest that this documentation has been prepared under the direction and in the presence of non-physician practitioner, Helen Hashimoto, PA-C. Electronically Signed: Higinio Plan, Scribe. 03/20/2017. 10:31 AM.  History   Chief Complaint Chief Complaint  Patient presents with  . Finger Injury   The history is provided by the patient. No language interpreter was used.   HPI Comments: Jeffrey Ayala is a 56 y.o. male who presents to the Emergency Department for an evaluation of a sudden onset, laceration to his left index finger that occurred just PTA. Pt reports he was using a chainsaw to saw something this morning when it suddenly "jumped up," causing him to accidentally cut his left index finger. Bleeding was controlled prior to visiting the ED. He believes his past tetanus vaccination was within the past 5 years. Pt denies any other complaints.   Past Medical History:  Diagnosis Date  . Concussion   . Headache(784.0)    Hx. Migrains  . Renal stone    Hx. multiple kidne;y stones    Patient Active Problem List   Diagnosis Date Noted  . Rupture of medial head of gastrocnemius, left, sequela 01/14/2017  . Kidney stones 06/22/2016  . HYPERTENSION 08/01/2010  . DYSTHYMIC DISORDER 07/08/2010  . PALPITATIONS 07/08/2010    Past Surgical History:  Procedure Laterality Date  . cystos with stents    . DENTAL SURGERY    . FINGER SURGERY    . HERNIA REPAIR     at age 44  . KNEE ARTHROSCOPY Left   . LITHOTRIPSY    . lithotripsy  07/2014  . NEPHROLITHOTOMY    . TONSILLECTOMY      Home Medications    Prior to Admission medications   Medication Sig Start Date End Date Taking? Authorizing Provider  diphenhydramine-acetaminophen (TYLENOL PM) 25-500 MG TABS tablet Take 1 tablet by mouth at bedtime as needed (Sleep).    Historical Provider, MD  naproxen  (NAPROSYN) 250 MG tablet Take 1 tablet (250 mg total) by mouth 2 (two) times daily with a meal. 04/01/16   Waynetta Pean, PA-C    Family History No family history on file.  Social History Social History  Substance Use Topics  . Smoking status: Never Smoker  . Smokeless tobacco: Never Used  . Alcohol use Yes     Comment: occassional   Allergies   Codeine and Flomax [tamsulosin hcl]  Review of Systems Review of Systems  Constitutional: Negative for fever.  Skin: Positive for wound.   Physical Exam Updated Vital Signs BP (!) 193/114 (BP Location: Right Arm)   Pulse 92   Temp 98.6 F (37 C) (Oral)   Resp 20   SpO2 100%   Physical Exam  Constitutional: He is oriented to person, place, and time. He appears well-developed and well-nourished.  HENT:  Head: Normocephalic.  Eyes: EOM are normal.  Neck: Normal range of motion.  Pulmonary/Chest: Effort normal.  Abdominal: He exhibits no distension.  Musculoskeletal: Normal range of motion.  Neurological: He is alert and oriented to person, place, and time.  Skin:  Laceration to the lateral aspect of the left index finger.   Psychiatric: He has a normal mood and affect.  Nursing note and vitals reviewed.  ED Treatments / Results  DIAGNOSTIC STUDIES:  Oxygen Saturation is 100% on RA, normal by my interpretation.    COORDINATION  OF CARE:  10:30 AM Discussed treatment plan with pt at bedside and pt agreed to plan.  Labs (all labs ordered are listed, but only abnormal results are displayed) Labs Reviewed - No data to display  EKG  EKG Interpretation None       Radiology Dg Finger Index Left  Result Date: 03/20/2017 CLINICAL DATA:  Chainsaw injury EXAM: LEFT SECOND FINGER 2+V COMPARISON:  None. FINDINGS: Frontal, oblique, and lateral views were obtained. There is a tiny metallic foreign body in the distal aspect of the distal second phalanx. There is overlying bandage. No fracture or dislocation. There is minimal  osteoarthritic change in the second DIP joint. No erosive change or bony destruction. IMPRESSION: Bandage distally. Slight osteoarthritic change second DIP joint. No fracture or dislocation. Electronically Signed   By: Lowella Grip III M.D.   On: 03/20/2017 10:48   Procedures .Marland KitchenLaceration Repair Date/Time: 03/20/2017 11:06 AM Performed by: Fransico Meadow Authorized by: Fransico Meadow   Consent:    Consent obtained:  Verbal   Consent given by:  Patient Anesthesia (see MAR for exact dosages):    Anesthesia method:  Nerve block   Block needle gauge:  27 G   Block anesthetic:  Lidocaine 1% w/o epi   Block outcome:  Anesthesia achieved Laceration details:    Location:  Finger   Finger location:  L index finger   Length (cm):  1 Repair type:    Repair type:  Intermediate Pre-procedure details:    Preparation:  Patient was prepped and draped in usual sterile fashion Skin repair:    Repair method:  Sutures   Suture size:  5-0   Suture material:  Prolene   Number of sutures:  4 (1 suture using 5-0 Vicryl to the corner of the nail bed and 3 sutures using 5-0 Prolene to the laceration. Nail fragment was removed.) Approximation:    Approximation:  Close   Vermilion border: well-aligned   Post-procedure details:    Patient tolerance of procedure:  Tolerated well, no immediate complications    (including critical care time)  Medications Ordered in ED Medications  lidocaine (PF) (XYLOCAINE) 1 % injection 5 mL (5 mLs Intradermal Given 03/20/17 1041)  lidocaine (PF) (XYLOCAINE) 1 % injection 5 mL (5 mLs Intradermal Given 03/20/17 1117)   Initial Impression / Assessment and Plan / ED Course  I have reviewed the triage vital signs and the nursing notes.  Pertinent labs & imaging results that were available during my care of the patient were reviewed by me and considered in my medical decision making (see chart for details).     Laceration occurred < 12 hours prior to repair.  Discussed laceration care with pt and answered questions. Pt to f-u for suture removal in 7-8 days and wound check sooner should there be signs of dehiscence or infection. Pt is hemodynamically stable with no complaints prior to dc.    Patient X-Ray negative for obvious fracture or dislocation. Patient will be discharged home & is agreeable with above plan. Returns precautions discussed. Pt appears safe for discharge.    Final Clinical Impressions(s) / ED Diagnoses   Final diagnoses:  Laceration of left index finger without foreign body with damage to nail, initial encounter    New Prescriptions Discharge Medication List as of 03/20/2017 11:45 AM    START taking these medications   Details  traMADol (ULTRAM) 50 MG tablet Take 1 tablet (50 mg total) by mouth every 6 (six) hours as needed.,  Starting Sat 03/20/2017, Print      An After Visit Summary was printed and given to the patient.  I personally performed the services in this documentation, which was scribed in my presence.  The recorded information has been reviewed and considered.   Ronnald Collum.   Hollace Kinnier [place], PA-C 03/20/17 [place], MD 03/20/17 [phone]

## 2017-03-20 NOTE — ED Notes (Signed)
Returned from xray

## 2017-03-20 NOTE — ED Notes (Signed)
Patient transported to X-ray 

## 2017-03-20 NOTE — ED Notes (Signed)
Set up for suturing.

## 2017-03-20 NOTE — ED Triage Notes (Signed)
Pt. Stated, I was sawing and the saw jumped and cut my left index finger. Cut across top circular.

## 2017-06-25 ENCOUNTER — Encounter (INDEPENDENT_AMBULATORY_CARE_PROVIDER_SITE_OTHER): Payer: Self-pay | Admitting: Family

## 2017-06-25 ENCOUNTER — Ambulatory Visit (INDEPENDENT_AMBULATORY_CARE_PROVIDER_SITE_OTHER): Payer: BLUE CROSS/BLUE SHIELD | Admitting: Family

## 2017-06-25 VITALS — Ht 71.0 in | Wt 220.0 lb

## 2017-06-25 DIAGNOSIS — M1711 Unilateral primary osteoarthritis, right knee: Secondary | ICD-10-CM

## 2017-06-25 DIAGNOSIS — M1712 Unilateral primary osteoarthritis, left knee: Secondary | ICD-10-CM

## 2017-06-25 MED ORDER — LIDOCAINE HCL 1 % IJ SOLN
5.0000 mL | INTRAMUSCULAR | Status: AC | PRN
  Administered 2017-06-25: 5 mL

## 2017-06-25 MED ORDER — METHYLPREDNISOLONE ACETATE 40 MG/ML IJ SUSP
40.0000 mg | INTRAMUSCULAR | Status: AC | PRN
  Administered 2017-06-25: 40 mg via INTRA_ARTICULAR

## 2017-06-25 NOTE — Progress Notes (Addendum)
Office Visit Note   Patient: Jeffrey Ayala           Date of Birth: [birth year]           MRN: [id number] Visit Date: 06/25/2017              Requested by: No referring provider defined for this encounter. PCP: Patient, No Pcp Per  Chief Complaint  Patient presents with  . Right Knee - Pain    S/p last injection 02/2017  . Left Knee - Pain      HPI: The patient is a 56 year old gentleman who presents today complaining of bilateral knee pain. Left worse than right. He requests bilateral knee injections. He has had ongoing issues with osteoarthritis has pain with kneeling. He has had injections in the past with minimal relief. Will like to discuss hyaluronic acid injections today.  Works as a Dealer and must squat and kneel.  Assessment & Plan: Visit Diagnoses:  1. Primary osteoarthritis of left knee   2. Primary osteoarthritis of right knee     Plan: Depo-Medrol injections bilateral knees today. We'll order Synvisc. He'll follow-up in office for these injections. Like to discuss total knee replacement [name] at that time.  Follow-Up Instructions: Return if symptoms worsen or fail to improve.   Right Knee Exam   Tenderness  The patient is experiencing tenderness in the medial joint line and lateral joint line.  Range of Motion  The patient has normal right knee ROM.  Muscle Strength   The patient has normal right knee strength.  Tests  Varus: negative Valgus: negative   Left Knee Exam   Tenderness  The patient is experiencing tenderness in the medial joint line and lateral joint line.  Range of Motion  The patient has normal left knee ROM.  Muscle Strength   The patient has normal left knee strength.  Tests  Varus: negative Valgus: negative  Other  Erythema: absent Swelling: mild Effusion: no effusion present      Patient is alert, oriented, no adenopathy, well-dressed, normal affect, normal respiratory effort.   Imaging: No results  found.  Labs: Lab Results  Component Value Date   LABURIC 7.4 04/10/2014    Orders:  No orders of the defined types were placed in this encounter.  No orders of the defined types were placed in this encounter.    Procedures: Large Joint Inj Date/Time: 06/25/2017 9:21 AM Performed by: Suzan Slick Authorized by: Dondra Prader R   Consent Given by:  Patient Site marked: the procedure site was marked   Timeout: prior to procedure the correct patient, procedure, and site was verified   Indications:  Pain and diagnostic evaluation Location:  Knee Site:  R knee Needle Size:  22 G Needle Length:  1.5 inches Ultrasound Guidance: No   Fluoroscopic Guidance: No   Arthrogram: No   Medications:  5 mL lidocaine 1 %; 40 mg methylPREDNISolone acetate 40 MG/ML Aspiration Attempted: No   Patient tolerance:  Patient tolerated the procedure well with no immediate complications Large Joint Inj Date/Time: 06/25/2017 9:21 AM Performed by: Dondra Prader R Authorized by: Dondra Prader R   Consent Given by:  Patient Site marked: the procedure site was marked   Timeout: prior to procedure the correct patient, procedure, and site was verified   Indications:  Pain and diagnostic evaluation Location:  Knee Site:  L knee Needle Size:  22 G Needle Length:  1.5 inches Ultrasound Guidance: No  Fluoroscopic Guidance: No   Arthrogram: No   Medications:  5 mL lidocaine 1 %; 40 mg methylPREDNISolone acetate 40 MG/ML Aspiration Attempted: No   Patient tolerance:  Patient tolerated the procedure well with no immediate complications    Clinical Data: No additional findings.  ROS:  All other systems negative, except as noted in the HPI. Review of Systems  Constitutional: Negative for chills and fever.  Musculoskeletal: Positive for arthralgias and joint swelling.    Objective: Vital Signs: Ht 5\' 11"  (1.803 m)   Wt 220 lb (99.8 kg)   BMI 30.68 kg/m   Specialty Comments:  No specialty  comments available.  PMFS History: Patient Active Problem List   Diagnosis Date Noted  . Rupture of medial head of gastrocnemius, left, sequela 01/14/2017  . Kidney stones 06/22/2016  . HYPERTENSION 08/01/2010  . DYSTHYMIC DISORDER 07/08/2010  . PALPITATIONS 07/08/2010   Past Medical History:  Diagnosis Date  . Concussion   . Headache(784.0)    Hx. Migrains  . Renal stone    Hx. multiple kidne;y stones    No family history on file.  Past Surgical History:  Procedure Laterality Date  . cystos with stents    . DENTAL SURGERY    . FINGER SURGERY    . HERNIA REPAIR     at age 3  . KNEE ARTHROSCOPY Left   . LITHOTRIPSY    . lithotripsy  07/2014  . NEPHROLITHOTOMY    . TONSILLECTOMY     Social History   Occupational History  . Not on file.   Social History Main Topics  . Smoking status: Never Smoker  . Smokeless tobacco: Never Used  . Alcohol use Yes     Comment: occassional  . Drug use: No  . Sexual activity: Not on file

## 2017-06-28 ENCOUNTER — Telehealth (INDEPENDENT_AMBULATORY_CARE_PROVIDER_SITE_OTHER): Payer: Self-pay

## 2017-06-28 NOTE — Telephone Encounter (Signed)
-----   Message from Suzan Slick, NP sent at 06/25/2017  9:22 AM EDT ----- Bilateral synvisc please

## 2017-06-28 NOTE — Telephone Encounter (Signed)
auth submitted for Synvisc and the information that was submitted with the insurance card that is in the chart is incorrect. Will have to resubmit with the insurance that is listed under demo sheet and not media tab.

## 2017-06-29 NOTE — Telephone Encounter (Signed)
Message sent to Synvisc one portal to give the correct ID number for the [employer]. I asked if I need to submit an new case request or if they can check benefits with the information given. Will hold this message pending advisement.

## 2017-07-06 NOTE — Telephone Encounter (Signed)
Prior auth sent to [employer]. Pt does not have medical benefits coverage but does have pharmacy benefits and auth was submitted today. Up to 15 day turn around time on decision. Will hold message pending approval.

## 2017-07-12 NOTE — Telephone Encounter (Signed)
Request was denied stating tht the pt must first try and fail monovisc injection. I will submit auth for this and continue to hold this pending approval.

## 2017-07-14 NOTE — Telephone Encounter (Signed)
Monovisc faxed notification that request has been received and will notify office once benefits have been determined.

## 2017-07-14 NOTE — Telephone Encounter (Signed)
Prior auth required through [employer]. Form completed and faxed today. monovisc is not covered at all under the medical benefit but is covered under the pharmacy benefit. There is no deductible a copay applies for the product in the amount of $149.98. Triaging rx to [facility] office steps were to complete prior auth and thrn hold message pending approval.

## 2017-07-15 NOTE — Telephone Encounter (Signed)
Second PA form for monovisc I called [phone] with case ID # [id number]. Answered by phone that x-rays were obtained and that injections have been provided and injection would be administered by orthopedic. This was approved with a start date of 06/15/17 though 08/14/17  We will receive a fax and pt will receive a call so that he can give permission to release injections to be shipped to our office.  I called the pt to advise. His appt for bilateral injections needs to be by 08/14/17. Lm on vm to call the office and make an appt once he has given verbal ok for shipment. Will hold message pending phone call. Stressed the importance of having this done prior to 08/14/17

## 2017-07-16 NOTE — Telephone Encounter (Signed)
Pt has an appt on 08/03/17 for bilat knee injections.

## 2017-08-03 ENCOUNTER — Telehealth (INDEPENDENT_AMBULATORY_CARE_PROVIDER_SITE_OTHER): Payer: Self-pay | Admitting: Physical Medicine and Rehabilitation

## 2017-08-03 ENCOUNTER — Ambulatory Visit (INDEPENDENT_AMBULATORY_CARE_PROVIDER_SITE_OTHER): Payer: BLUE CROSS/BLUE SHIELD | Admitting: Orthopedic Surgery

## 2017-08-03 ENCOUNTER — Encounter (INDEPENDENT_AMBULATORY_CARE_PROVIDER_SITE_OTHER): Payer: Self-pay | Admitting: Orthopedic Surgery

## 2017-08-03 DIAGNOSIS — M1711 Unilateral primary osteoarthritis, right knee: Secondary | ICD-10-CM | POA: Diagnosis not present

## 2017-08-03 DIAGNOSIS — M1712 Unilateral primary osteoarthritis, left knee: Secondary | ICD-10-CM

## 2017-08-03 MED ORDER — HYALURONAN 88 MG/4ML IX SOSY
88.0000 mg | PREFILLED_SYRINGE | INTRA_ARTICULAR | Status: AC | PRN
  Administered 2017-08-03: 88 mg via INTRA_ARTICULAR

## 2017-08-03 MED ORDER — METHYLPREDNISOLONE ACETATE 40 MG/ML IJ SUSP
40.0000 mg | INTRAMUSCULAR | Status: AC | PRN
  Administered 2017-08-03: 40 mg via INTRA_ARTICULAR

## 2017-08-03 MED ORDER — LIDOCAINE HCL 1 % IJ SOLN
5.0000 mL | INTRAMUSCULAR | Status: AC | PRN
  Administered 2017-08-03: 5 mL

## 2017-08-03 NOTE — Telephone Encounter (Signed)
IC [facility] SP and s/w Lewis, he advised me that they have been trying to reach him about the meds.  I have called patient and LMVM to call me back.

## 2017-08-03 NOTE — Telephone Encounter (Signed)
PT STATED UNCERTAIN ABOUT THE SCRIPT. MD NEEDS TO CALL IN REGARDS TO REPLACING THE MED

## 2017-08-03 NOTE — Progress Notes (Signed)
Office Visit Note   Patient: Jeffrey Ayala           Date of Birth: [birth year]           MRN: [id number] Visit Date: 08/03/2017              Requested by: No referring provider defined for this encounter. PCP: Patient, No Pcp Per  Chief Complaint  Patient presents with  . Left Knee - Pain  . Right Knee - Pain      HPI: Patient is a 56 shell gentleman presents for evaluation for bilateral moderate disc injections. Patient has had temporary relief from steroid injections.  Assessment & Plan: Visit Diagnoses:  1. Primary osteoarthritis of left knee   2. Primary osteoarthritis of right knee     Plan: Both knees were injected with mod of this about complications. Discussed that he developed an allergic reaction to follow-up for aspiration.  Follow-Up Instructions: Return if symptoms worsen or fail to improve.   Ortho Exam  Patient is alert, oriented, no adenopathy, well-dressed, normal affect, normal respiratory effort. Examination patient's knees have no effusion there is no redness no cellulitis.  Imaging: No results found. No images are attached to the encounter.  Labs: Lab Results  Component Value Date   LABURIC 7.4 04/10/2014    Orders:  No orders of the defined types were placed in this encounter.  No orders of the defined types were placed in this encounter.    Procedures: Large Joint Inj Date/Time: 08/03/2017 2:25 PM Performed by: Nadara Mustard Authorized by: Nadara Mustard   Consent Given by:  Patient Site marked: the procedure site was marked   Timeout: prior to procedure the correct patient, procedure, and site was verified   Indications:  Pain and diagnostic evaluation Location:  Knee Site:  R knee Prep: patient was prepped and draped in usual sterile fashion   Needle Size:  22 G Needle Length:  1.5 inches Approach:  Anteromedial Ultrasound Guidance: No   Fluoroscopic Guidance: No   Arthrogram: No   Medications:  88 mg Hyaluronan 88  MG/4ML Aspiration Attempted: No   Patient tolerance:  Patient tolerated the procedure well with no immediate complications Large Joint Inj Date/Time: 08/03/2017 2:26 PM Performed by: Aleighya Mcanelly V Authorized by: Nadara Mustard   Consent Given by:  Patient Site marked: the procedure site was marked   Timeout: prior to procedure the correct patient, procedure, and site was verified   Indications:  Pain and diagnostic evaluation Location:  Knee Site:  L knee Prep: patient was prepped and draped in usual sterile fashion   Needle Size:  22 G Needle Length:  1.5 inches Approach:  Anteromedial Ultrasound Guidance: No   Fluoroscopic Guidance: No   Arthrogram: No   Medications:  5 mL lidocaine 1 %; 40 mg methylPREDNISolone acetate 40 MG/ML; 88 mg Hyaluronan 88 MG/4ML Aspiration Attempted: No   Patient tolerance:  Patient tolerated the procedure well with no immediate complications    Clinical Data: No additional findings.  ROS:  All other systems negative, except as noted in the HPI. Review of Systems  Objective: Vital Signs: There were no vitals taken for this visit.  Specialty Comments:  No specialty comments available.  PMFS History: Patient Active Problem List   Diagnosis Date Noted  . Rupture of medial head of gastrocnemius, left, sequela 01/14/2017  . Kidney stones 06/22/2016  . HYPERTENSION 08/01/2010  . DYSTHYMIC DISORDER 07/08/2010  . PALPITATIONS 07/08/2010  Past Medical History:  Diagnosis Date  . Concussion   . Headache(784.0)    Hx. Migrains  . Renal stone    Hx. multiple kidne;y stones    No family history on file.  Past Surgical History:  Procedure Laterality Date  . cystos with stents    . DENTAL SURGERY    . FINGER SURGERY    . HERNIA REPAIR     at age 2  . KNEE ARTHROSCOPY Left   . LITHOTRIPSY    . lithotripsy  07/2014  . NEPHROLITHOTOMY    . TONSILLECTOMY     Social History   Occupational History  . Not on file.   Social History  Main Topics  . Smoking status: Never Smoker  . Smokeless tobacco: Never Used  . Alcohol use Yes     Comment: occassional  . Drug use: No  . Sexual activity: Not on file

## 2017-08-04 ENCOUNTER — Telehealth (INDEPENDENT_AMBULATORY_CARE_PROVIDER_SITE_OTHER): Payer: Self-pay | Admitting: Orthopedic Surgery

## 2017-08-04 NOTE — Telephone Encounter (Signed)
Patient returned call to Abigail Butts   Asked for a call  back  The number to contact patient is [phone]

## 2017-08-04 NOTE — Telephone Encounter (Signed)
IC patient and discussed, I advised him to call [facility] to arrange shipment, he says he will call them right now. FYI, you should be getting a call to discuss shipping date once pt calls them.

## 2017-08-04 NOTE — Telephone Encounter (Signed)
I called patient, see other message.

## 2017-08-11 ENCOUNTER — Encounter (INDEPENDENT_AMBULATORY_CARE_PROVIDER_SITE_OTHER): Payer: Self-pay | Admitting: Radiology

## 2017-08-19 ENCOUNTER — Telehealth (INDEPENDENT_AMBULATORY_CARE_PROVIDER_SITE_OTHER): Payer: Self-pay | Admitting: Orthopedic Surgery

## 2017-08-19 NOTE — Telephone Encounter (Signed)
This was already done months ago. This was found in media tab and refaxed.

## 2017-08-19 NOTE — Telephone Encounter (Signed)
Lattie Haw with [employer] called advised the monovisc need prior auth. The number to contact Lattie Haw is [phone]   Case # [id number]

## 2018-01-10 DIAGNOSIS — M5030 Other cervical disc degeneration, unspecified cervical region: Secondary | ICD-10-CM | POA: Insufficient documentation

## 2018-06-29 ENCOUNTER — Ambulatory Visit (INDEPENDENT_AMBULATORY_CARE_PROVIDER_SITE_OTHER): Payer: BLUE CROSS/BLUE SHIELD | Admitting: Family

## 2018-06-29 ENCOUNTER — Encounter (INDEPENDENT_AMBULATORY_CARE_PROVIDER_SITE_OTHER): Payer: Self-pay | Admitting: Family

## 2018-06-29 ENCOUNTER — Telehealth (INDEPENDENT_AMBULATORY_CARE_PROVIDER_SITE_OTHER): Payer: Self-pay | Admitting: Orthopedic Surgery

## 2018-06-29 DIAGNOSIS — M170 Bilateral primary osteoarthritis of knee: Secondary | ICD-10-CM

## 2018-06-29 NOTE — Telephone Encounter (Signed)
Monovisc injection request per Junie Panning

## 2018-06-29 NOTE — Telephone Encounter (Signed)
Bilateral knee OA monovisc injection for both knees.

## 2018-06-29 NOTE — Progress Notes (Signed)
Office Visit Note   Patient: Jeffrey Ayala           Date of Birth: 10-02-1961           MRN: [id number] Visit Date: 06/29/2018              Requested by: No referring provider defined for this encounter. PCP: Patient, No Pcp Per  Chief Complaint  Patient presents with  . Right Knee - Pain  . Left Knee - Pain      HPI: The patient is a 57 year old gentleman who presents today complaining of chronic bilateral knee pain. Left worse than right. He requests bilateral knee injections. He has had ongoing issues with osteoarthritis has pain with kneeling. He has had injections in the past with minimal relief.   Works as a Dealer and must squat and kneel.  Has had very minimal relief with monovisc injections bilateral knees in past.  Would like to hold off on TKA for as long as possible.   Assessment & Plan: Visit Diagnoses:  1. Bilateral primary osteoarthritis of knee     Plan: Depo-Medrol injections bilateral knees today. We'll order Synvisc. He'll follow-up in office for these injections.   Follow-Up Instructions: Return in about 1 month (around 07/27/2018), or if symptoms worsen or fail to improve.   Right Knee Exam   Muscle Strength  The patient has normal right knee strength.  Tenderness  The patient is experiencing tenderness in the medial joint line and lateral joint line.  Range of Motion  The patient has normal right knee ROM.  Tests  Varus: negative Valgus: negative   Left Knee Exam   Muscle Strength  The patient has normal left knee strength.  Tenderness  The patient is experiencing tenderness in the medial joint line and lateral joint line.  Range of Motion  The patient has normal left knee ROM.  Tests  Varus: negative Valgus: negative  Other  Erythema: absent Swelling: mild Effusion: no effusion present      Patient is alert, oriented, no adenopathy, well-dressed, normal affect, normal respiratory effort.   Imaging: No results  found.  Labs: Lab Results  Component Value Date   LABURIC 7.4 04/10/2014    Orders:  No orders of the defined types were placed in this encounter.  No orders of the defined types were placed in this encounter.    Procedures: Large Joint Inj: bilateral knee on 06/29/2018 1:31 PM Indications: pain Details: 18 G 1.5 in needle, anteromedial approach Medications (Right): 5 mL lidocaine 1 %; 40 mg methylPREDNISolone acetate 40 MG/ML Medications (Left): 5 mL lidocaine 1 %; 40 mg methylPREDNISolone acetate 40 MG/ML Consent was given by the patient.      Clinical Data: No additional findings.  ROS:  All other systems negative, except as noted in the HPI. Review of Systems  Constitutional: Negative for chills and fever.  Musculoskeletal: Positive for arthralgias and joint swelling.    Objective: Vital Signs: There were no vitals taken for this visit.  Specialty Comments:  No specialty comments available.  PMFS History: Patient Active Problem List   Diagnosis Date Noted  . Rupture of medial head of gastrocnemius, left, sequela 01/14/2017  . Kidney stones 06/22/2016  . HYPERTENSION 08/01/2010  . DYSTHYMIC DISORDER 07/08/2010  . PALPITATIONS 07/08/2010   Past Medical History:  Diagnosis Date  . Concussion   . Headache(784.0)    Hx. Migrains  . Renal stone    Hx. multiple kidne;y stones  History reviewed. No pertinent family history.  Past Surgical History:  Procedure Laterality Date  . cystos with stents    . DENTAL SURGERY    . FINGER SURGERY    . HERNIA REPAIR     at age 40  . KNEE ARTHROSCOPY Left   . LITHOTRIPSY    . lithotripsy  07/2014  . NEPHROLITHOTOMY    . TONSILLECTOMY     Social History   Occupational History  . Not on file  Tobacco Use  . Smoking status: Never Smoker  . Smokeless tobacco: Never Used  Substance and Sexual Activity  . Alcohol use: Yes    Comment: occassional  . Drug use: No  . Sexual activity: Not on file

## 2018-06-30 ENCOUNTER — Telehealth (INDEPENDENT_AMBULATORY_CARE_PROVIDER_SITE_OTHER): Payer: Self-pay

## 2018-06-30 NOTE — Telephone Encounter (Signed)
Noted  

## 2018-06-30 NOTE — Telephone Encounter (Signed)
Submitted for VOB, SynviscOne, bilateral knee.

## 2018-07-04 ENCOUNTER — Telehealth (INDEPENDENT_AMBULATORY_CARE_PROVIDER_SITE_OTHER): Payer: Self-pay

## 2018-07-04 NOTE — Telephone Encounter (Signed)
Will they not approve any injection? Like monovisc or eufflexa?

## 2018-07-04 NOTE — Telephone Encounter (Signed)
Called and left a VM advising patient that Gilda Crease, is not covered through his insurance and to give [place] a call back.  Could you please advise on what the next step would be for the patient? Cb# is [phone].  Thank you.

## 2018-07-05 NOTE — Telephone Encounter (Signed)
please advise. The pt's insurance does not cover any gel injection of any type.

## 2018-07-05 NOTE — Telephone Encounter (Signed)
No gel injection is covered at all through his insurance.

## 2018-07-06 MED ORDER — LIDOCAINE HCL 1 % IJ SOLN
5.0000 mL | INTRAMUSCULAR | Status: AC | PRN
  Administered 2018-06-29: 5 mL

## 2018-07-06 MED ORDER — METHYLPREDNISOLONE ACETATE 40 MG/ML IJ SUSP
40.0000 mg | INTRAMUSCULAR | Status: AC | PRN
  Administered 2018-06-29: 40 mg via INTRA_ARTICULAR

## 2018-07-07 NOTE — Telephone Encounter (Signed)
Call patient, insurance will not cover jel injections, could proceed with repeat steroid injection, or consider surgery

## 2018-07-08 NOTE — Telephone Encounter (Signed)
Patient says that injection was covered under his pharm benefit last time.  He wants to go thru specialty pharm to get the injection, I will send to spec pharm.

## 2018-07-08 NOTE — Telephone Encounter (Signed)
Checking with [employer] to see if pharm benefits are available for him.

## 2018-07-10 NOTE — Telephone Encounter (Signed)
Resubmitted on [employer] website, so they can verify the pharmacy benefits.  Will need to print off the form and stamp [name] signature on it, and upload or fax in.

## 2018-07-11 NOTE — Telephone Encounter (Signed)
Form uploaded to website.  Please f/u on Pharm benefits for patient, thanks.

## 2018-07-15 NOTE — Telephone Encounter (Signed)
Noted  

## 2018-07-19 ENCOUNTER — Telehealth (INDEPENDENT_AMBULATORY_CARE_PROVIDER_SITE_OTHER): Payer: Self-pay

## 2018-07-19 NOTE — Telephone Encounter (Signed)
Submitted VOB for Monovisc, bilateral knee. 

## 2018-07-19 NOTE — Telephone Encounter (Signed)
Talked with Jeffrey Ayala with [facility] and was advised that product is no longer coverd under insurance plan.  Stated that Euflexxa or Monovisc are the preferred products for patient's insurance.  Will submit for Monovisc, bilateral knee.

## 2018-08-02 ENCOUNTER — Telehealth (INDEPENDENT_AMBULATORY_CARE_PROVIDER_SITE_OTHER): Payer: Self-pay

## 2018-08-02 NOTE — Telephone Encounter (Signed)
PA was needed for Monovisc, bilateral knee. Talked with [facility] and provided her with information for PA.  PA is approved for Monovisc, bilateral knee under pharmacy benefits per Lonia Skinner. Authorization# [id number] Valid 07/03/2018- 09/01/2018 Gave verbal order for Monovisc, bilateral knee Rx over the phone with [facility].  Called and advised patient of above messages and also advised that he will receive a call to give consent to have Monovisc delivered to our office.

## 2018-08-04 ENCOUNTER — Telehealth (INDEPENDENT_AMBULATORY_CARE_PROVIDER_SITE_OTHER): Payer: Self-pay

## 2018-08-04 ENCOUNTER — Telehealth (INDEPENDENT_AMBULATORY_CARE_PROVIDER_SITE_OTHER): Payer: Self-pay | Admitting: Orthopedic Surgery

## 2018-08-04 NOTE — Telephone Encounter (Signed)
Tammie S., PCA with [facility] called for permission to have patient's Monovisc injection, bilateral knee delivered to patient's home address.  Per Fatima Blank to ship medication to patient's address.  Received confirmation from [place] stating that medication will be delivered on Wednesday, 08/10/2018 to patient's home address. Patient has appointment scheduled for Wednesday, 08/17/2018 and will bring Monovisc injection for bilateral knee.

## 2018-08-04 NOTE — Telephone Encounter (Signed)
Message sent in error

## 2018-08-17 ENCOUNTER — Encounter (INDEPENDENT_AMBULATORY_CARE_PROVIDER_SITE_OTHER): Payer: Self-pay | Admitting: Family

## 2018-08-17 ENCOUNTER — Ambulatory Visit (INDEPENDENT_AMBULATORY_CARE_PROVIDER_SITE_OTHER): Payer: BLUE CROSS/BLUE SHIELD | Admitting: Family

## 2018-08-17 VITALS — Ht 71.0 in | Wt 220.0 lb

## 2018-08-17 DIAGNOSIS — M1711 Unilateral primary osteoarthritis, right knee: Secondary | ICD-10-CM | POA: Diagnosis not present

## 2018-08-17 DIAGNOSIS — M1732 Unilateral post-traumatic osteoarthritis, left knee: Secondary | ICD-10-CM

## 2018-08-17 MED ORDER — TRAMADOL HCL 50 MG PO TABS: 50.0000 mg | ORAL_TABLET | Freq: Four times a day (QID) | ORAL | 0 refills | Status: DC | PRN

## 2018-08-17 MED ORDER — HYALURONAN 88 MG/4ML IX SOSY
88.0000 mg | PREFILLED_SYRINGE | INTRA_ARTICULAR | Status: AC | PRN
  Administered 2018-08-17: 88 mg via INTRA_ARTICULAR

## 2018-08-17 NOTE — Progress Notes (Addendum)
Office Visit Note   Patient: Jeffrey Ayala           Date of Birth: [birth year]           MRN: [id number] Visit Date: 08/17/2018              Requested by: No referring provider defined for this encounter. PCP: Patient, No Pcp Per  Chief Complaint  Patient presents with  . Left Knee - Follow-up    monovisc inj  . Right Knee - Follow-up      HPI: The patient is a 57 year old gentleman who presents today complaining of chronic bilateral knee pain. Left worse than right. Presents for bilateral knee injections, monovisc. He has had ongoing issues with osteoarthritis has pain with kneeling. He has had Depomedrol injections in the past with minimal relief. Last injection provided only days of relief. Now having difficulty descending stairs due to pain.    Would like to hold off on TKA for as long as possible.   Assessment & Plan: Visit Diagnoses:  1. Post-traumatic osteoarthritis of left knee   2. Primary osteoarthritis of right knee     Plan: Monovisc injections bilateral knees today. Would like to consider TKA, would like to have both done at the same time. Will discuss with Dr Sharol Given. Call patient to discuss.   Follow-Up Instructions: Return if symptoms worsen or fail to improve.   Right Knee Exam   Muscle Strength  The patient has normal right knee strength.  Tenderness  The patient is experiencing tenderness in the medial joint line and lateral joint line.  Range of Motion  The patient has normal right knee ROM.  Tests  Varus: negative Valgus: negative   Left Knee Exam   Muscle Strength  The patient has normal left knee strength.  Tenderness  The patient is experiencing tenderness in the medial joint line and lateral joint line.  Range of Motion  The patient has normal left knee ROM.  Tests  Varus: negative Valgus: negative  Other  Erythema: absent Swelling: mild Effusion: no effusion present      Patient is alert, oriented, no adenopathy,  well-dressed, normal affect, normal respiratory effort.   Imaging: No results found.  Labs: Lab Results  Component Value Date   LABURIC 7.4 04/10/2014    Orders:  No orders of the defined types were placed in this encounter.  Meds ordered this encounter  Medications  . traMADol (ULTRAM) 50 MG tablet    Sig: Take 1 tablet (50 mg total) by mouth every 6 (six) hours as needed.    Dispense:  28 tablet    Refill:  0     Procedures: Large Joint Inj: bilateral knee on 08/17/2018 11:09 AM Indications: pain Details: 18 G 1.5 in needle, anteromedial approach Medications (Right): 88 mg Hyaluronan 88 MG/4ML Medications (Left): 88 mg Hyaluronan 88 MG/4ML Consent was given by the patient.      Clinical Data: No additional findings.  ROS:  All other systems negative, except as noted in the HPI. Review of Systems  Constitutional: Negative for chills and fever.  Musculoskeletal: Positive for arthralgias and joint swelling.    Objective: Vital Signs: Ht 5\' 11"  (1.803 m)   Wt 220 lb (99.8 kg)   BMI 30.68 kg/m   Specialty Comments:  No specialty comments available.  PMFS History: Patient Active Problem List   Diagnosis Date Noted  . Rupture of medial head of gastrocnemius, left, sequela 01/14/2017  . Kidney stones  06/22/2016  . HYPERTENSION 08/01/2010  . DYSTHYMIC DISORDER 07/08/2010  . PALPITATIONS 07/08/2010   Past Medical History:  Diagnosis Date  . Concussion   . Headache(784.0)    Hx. Migrains  . Renal stone    Hx. multiple kidne;y stones    History reviewed. No pertinent family history.  Past Surgical History:  Procedure Laterality Date  . cystos with stents    . DENTAL SURGERY    . FINGER SURGERY    . HERNIA REPAIR     at age 52  . KNEE ARTHROSCOPY Left   . LITHOTRIPSY    . lithotripsy  07/2014  . NEPHROLITHOTOMY    . TONSILLECTOMY     Social History   Occupational History  . Not on file  Tobacco Use  . Smoking status: Never Smoker  .  Smokeless tobacco: Never Used  Substance and Sexual Activity  . Alcohol use: Yes    Comment: occassional  . Drug use: No  . Sexual activity: Not on file

## 2018-10-12 ENCOUNTER — Ambulatory Visit (INDEPENDENT_AMBULATORY_CARE_PROVIDER_SITE_OTHER): Payer: BLUE CROSS/BLUE SHIELD | Admitting: Orthopedic Surgery

## 2018-10-17 ENCOUNTER — Ambulatory Visit (INDEPENDENT_AMBULATORY_CARE_PROVIDER_SITE_OTHER): Payer: BLUE CROSS/BLUE SHIELD | Admitting: Orthopedic Surgery

## 2018-10-17 ENCOUNTER — Encounter (INDEPENDENT_AMBULATORY_CARE_PROVIDER_SITE_OTHER): Payer: Self-pay | Admitting: Orthopedic Surgery

## 2018-10-17 VITALS — Ht 71.0 in | Wt 220.0 lb

## 2018-10-17 DIAGNOSIS — M170 Bilateral primary osteoarthritis of knee: Secondary | ICD-10-CM

## 2018-10-17 NOTE — Progress Notes (Signed)
Office Visit Note   Patient: Jeffrey Ayala           Date of Birth: 29-Jun-1961           MRN: [id number] Visit Date: 10/17/2018              Requested by: No referring provider defined for this encounter. PCP: Patient, No Pcp Per  Chief Complaint  Patient presents with  . Right Knee - Pain  . Left Knee - Pain      HPI: Patient is a 57 year old gentleman who presents complaining of worsening arthritis in both knees he states he has pain all the time cannot perform his activities of daily living.  Patient states that he has tried exercise without relief he has had steroid injections he has had hyaluronic acid injections all have not provided relief.  Assessment & Plan: Visit Diagnoses:  1. Bilateral primary osteoarthritis of knee     Plan: Discussed with the patient the increased risks associated with bilateral total knee arthroplasties increased risk of infection neurovascular injury pain need for additional surgery.  Patient states he understands states he wishes to proceed with surgery.  Discussed the importance of aggressively working with therapy and that his pain would be significantly higher.  Discussed that the pain could be controlled with ice but narcotics would not help much with postoperative pain.  Discussed that if patient does not progress as expected with therapy we he would have to be discharged to a skilled nursing facility.  Patient states he understands he states he would like to proceed with surgery as soon as possible.  Follow-Up Instructions: Return in about 1 week (around 10/24/2018).   Ortho Exam  Patient is alert, oriented, no adenopathy, well-dressed, normal affect, normal respiratory effort. Examination patient has an antalgic gait he is crepitation with range of motion of both knees varus alignment bilaterally radiographs shows tricompartmental arthritic changes.  Patient is not a smoker he has no History of pulmonary disease. Imaging: No results  found. No images are attached to the encounter.  Labs: Lab Results  Component Value Date   LABURIC 7.4 04/10/2014     Lab Results  Component Value Date   LABURIC 7.4 04/10/2014    Body mass index is 30.68 kg/m.  Orders:  No orders of the defined types were placed in this encounter.  No orders of the defined types were placed in this encounter.    Procedures: No procedures performed  Clinical Data: No additional findings.  ROS:  All other systems negative, except as noted in the HPI. Review of Systems  Objective: Vital Signs: Ht 5\' 11"  (1.803 m)   Wt 220 lb (99.8 kg)   BMI 30.68 kg/m   Specialty Comments:  No specialty comments available.  PMFS History: Patient Active Problem List   Diagnosis Date Noted  . Rupture of medial head of gastrocnemius, left, sequela 01/14/2017  . Kidney stones 06/22/2016  . HYPERTENSION 08/01/2010  . DYSTHYMIC DISORDER 07/08/2010  . PALPITATIONS 07/08/2010   Past Medical History:  Diagnosis Date  . Concussion   . Headache(784.0)    Hx. Migrains  . Renal stone    Hx. multiple kidne;y stones    History reviewed. No pertinent family history.  Past Surgical History:  Procedure Laterality Date  . cystos with stents    . DENTAL SURGERY    . FINGER SURGERY    . HERNIA REPAIR     at age 15  . KNEE ARTHROSCOPY Left   .  LITHOTRIPSY    . lithotripsy  07/2014  . NEPHROLITHOTOMY    . TONSILLECTOMY     Social History   Occupational History  . Not on file  Tobacco Use  . Smoking status: Never Smoker  . Smokeless tobacco: Never Used  Substance and Sexual Activity  . Alcohol use: Yes    Comment: occassional  . Drug use: No  . Sexual activity: Not on file

## 2018-10-24 ENCOUNTER — Ambulatory Visit (INDEPENDENT_AMBULATORY_CARE_PROVIDER_SITE_OTHER): Payer: Self-pay | Admitting: Physician Assistant

## 2018-10-27 NOTE — Pre-Procedure Instructions (Signed)
Jeffrey Ayala  10/27/2018      [id number] [place], [place] - [address] [address] Phone: [phone] Fax: [phone]    Your procedure is scheduled on Wednesday, Dec. 12th   Report to [facility] Admitting at 0630 am             (posted surgery time 8:30a - 10:09a)   Call this number if you have problems the morning of surgery:  [phone]   Remember:               7 days prior to surgery, STOP TAKING any Vitamins, Herbal Supplements, Anti-inflammatories, Blood Thinners.   Do not eat any foods or drink any liquids after midnight, Tuesday.   Take these medicines the morning of surgery with A SIP OF WATER :  None    Do not wear jewelry - NO RINGS  Do not wear lotions, colognes or deodorant.             Men may shave face and neck.   Do not bring valuables to the hospital.  [facility] is not responsible for any belongings or valuables.  Contacts, dentures or bridgework may not be worn into surgery.  Leave your suitcase in the car.  After surgery it may be brought to your room.  For patients admitted to the hospital, discharge time will be determined by your treatment team.  Please read over the following fact sheets that you were given.        [facility]- Preparing For Surgery  Before surgery, you can play an important role. Because skin is not sterile, your skin needs to be as free of germs as possible. You can reduce the number of germs on your skin by washing with CHG (chlorahexidine gluconate) Soap before surgery.  CHG is an antiseptic cleaner which kills germs and bonds with the skin to continue killing germs even after washing.    Oral Hygiene is also important to reduce your risk of infection.    Remember - BRUSH YOUR TEETH THE MORNING OF SURGERY WITH YOUR REGULAR TOOTHPASTE  Please do not use if you have an allergy to CHG or antibacterial soaps. If your skin  becomes reddened/irritated stop using the CHG.  Do not shave (including legs and underarms) for at least 48 hours prior to first CHG shower. It is OK to shave your face.  Please follow these instructions carefully.   1. Shower the NIGHT BEFORE SURGERY and the MORNING OF SURGERY with CHG.   2. If you chose to wash your hair, wash your hair first as usual with your normal shampoo.  3. After you shampoo, rinse your hair and body thoroughly to remove the shampoo.  4. Use CHG as you would any other liquid soap. You can apply CHG directly to the skin and wash gently with a scrungie or a clean washcloth.   5. Apply the CHG Soap to your body ONLY FROM THE NECK DOWN.  Do not use on open wounds or open sores. Avoid contact with your eyes, ears, mouth and genitals (private parts). Wash Face and genitals (private parts)  with your normal soap.  6. Wash thoroughly, paying special attention to the area where your surgery will be performed.  7. Thoroughly rinse your body with warm water from the neck down.  8. DO NOT shower/wash with your normal soap after using and rinsing off  the CHG Soap.  9. Pat yourself dry with a CLEAN TOWEL.  10. Wear CLEAN PAJAMAS to bed the night before surgery, wear comfortable clothes the morning of surgery  11. Place CLEAN SHEETS on your bed the night of your first shower and DO NOT SLEEP WITH PETS.  Day of Surgery:  Do not apply any deodorants/lotions.  Please wear clean clothes to the hospital/surgery center.   Remember to brush your teeth WITH YOUR REGULAR TOOTHPASTE.

## 2018-10-28 ENCOUNTER — Other Ambulatory Visit: Payer: Self-pay

## 2018-10-28 ENCOUNTER — Encounter (HOSPITAL_COMMUNITY)
Admission: RE | Admit: 2018-10-28 | Discharge: 2018-10-28 | Disposition: A | Discharge status: Home / Self Care | Payer: BLUE CROSS/BLUE SHIELD | Source: Ambulatory Visit | Attending: Orthopedic Surgery | Admitting: Orthopedic Surgery

## 2018-10-28 ENCOUNTER — Encounter (HOSPITAL_COMMUNITY): Payer: Self-pay

## 2018-10-28 DIAGNOSIS — R9431 Abnormal electrocardiogram [ECG] [EKG]: Secondary | ICD-10-CM | POA: Diagnosis not present

## 2018-10-28 DIAGNOSIS — Z01818 Encounter for other preprocedural examination: Secondary | ICD-10-CM | POA: Diagnosis not present

## 2018-10-28 HISTORY — DX: Personal history of urinary calculi: Z87.442

## 2018-10-28 LAB — CBC
HCT: 42.9 % (ref 39.0–52.0)
Hemoglobin: 13.4 g/dL (ref 13.0–17.0)
MCH: 28.4 pg (ref 26.0–34.0)
MCHC: 31.2 g/dL (ref 30.0–36.0)
MCV: 90.9 fL (ref 80.0–100.0)
Platelets: 239 10*3/uL (ref 150–400)
RBC: 4.72 MIL/uL (ref 4.22–5.81)
RDW: 14 % (ref 11.5–15.5)
WBC: 6.4 10*3/uL (ref 4.0–10.5)
nRBC: 0 % (ref 0.0–0.2)

## 2018-10-28 LAB — BASIC METABOLIC PANEL
Anion gap: 9 (ref 5–15)
BUN: 12 mg/dL (ref 6–20)
CO2: 26 mmol/L (ref 22–32)
Calcium: 9.3 mg/dL (ref 8.9–10.3)
Chloride: 107 mmol/L (ref 98–111)
Creatinine, Ser: 0.98 mg/dL (ref 0.61–1.24)
GFR calc Af Amer: >60 mL/min (ref >60)
GFR calc non Af Amer: >60 mL/min (ref >60)
Glucose, Bld: 106 mg/dL — ABNORMAL HIGH (ref 70–99)
POTASSIUM: 3.6 mmol/L (ref 3.5–5.1)
Sodium: 142 mmol/L (ref 135–145)

## 2018-10-28 LAB — SURGICAL PCR SCREEN
MRSA, PCR: NEGATIVE
Staphylococcus aureus: NEGATIVE

## 2018-10-28 NOTE — Progress Notes (Addendum)
PCP: pt denies  Cardiologist: pt denies  EKG: 10/28/18 in EPIC  Stress test: denies  ECHO: 2011-chest pain related to anxiety-no cardiac issues  Cardiac Cath: denies  Chest x-ray: denies past year, no recent respiratory infections/complications

## 2018-10-28 NOTE — Progress Notes (Signed)
Jeffrey Ayala            10/27/2018                          [id number] [place], [place] - [address] [place] [address] Phone: [phone] Fax: [phone]              Your procedure is scheduled on Wednesday, Dec. 11th             Report to [name] Admitting at 0630 am             (posted surgery time 8:30a - 10:09a)             Call this number if you have problems the morning of surgery:            [phone]             Remember:               7 days prior to surgery, STOP TAKING any Vitamins, Herbal Supplements, Anti-inflammatories, Blood Thinners.             Do not eat any foods or drink any liquids after midnight, Tuesday.             Take these medicines the morning of surgery with A SIP OF WATER :  None                        Do not wear jewelry - NO RINGS            Do not wear lotions, colognes or deodorant.             Men may shave face and neck.             Do not bring valuables to the hospital.            [name] is not responsible for any belongings or valuables.  Contacts, dentures or bridgework may not be worn into surgery.  Leave your suitcase in the car.  After surgery it may be brought to your room.  For patients admitted to the hospital, discharge time will be determined by your treatment team.  Please read over the following fact sheets that you were given.                            [name]  Before surgery, you can play an important role. Because skin is not sterile, your skin needs to be as free of germs as possible. You can reduce the number of germs on your skin by washing with CHG (chlorahexidine gluconate) Soap before surgery.  CHG is an antiseptic cleaner which kills germs and bonds with the skin to continue killing germs even after washing.    Oral Hygiene is also important to reduce your risk of  infection.    Remember - BRUSH YOUR TEETH THE MORNING OF SURGERY WITH YOUR REGULAR TOOTHPASTE  Please do not use if you have an allergy to CHG or antibacterial soaps. If your skin becomes reddened/irritated stop using the CHG.  Do not shave (including legs and underarms) for at least 48 hours prior to first CHG shower. It is OK to shave your face.  Please follow these instructions carefully.  1. Shower the NIGHT BEFORE SURGERY and the MORNING OF SURGERY with CHG.   2. If you chose to wash your hair, wash your hair first as usual with your normal shampoo.  3. After you shampoo, rinse your hair and body thoroughly to remove the shampoo.  4. Use CHG as you would any other liquid soap. You can apply CHG directly to the skin and wash gently with a scrungie or a clean washcloth.   5. Apply the CHG Soap to your body ONLY FROM THE NECK DOWN.  Do not use on open wounds or open sores. Avoid contact with your eyes, ears, mouth and genitals (private parts). Wash Face and genitals (private parts)  with your normal soap.  6. Wash thoroughly, paying special attention to the area where your surgery will be performed.  7. Thoroughly rinse your body with warm water from the neck down.  8. DO NOT shower/wash with your normal soap after using and rinsing off the CHG Soap.  9. Pat yourself dry with a CLEAN TOWEL.  10. Wear CLEAN PAJAMAS to bed the night before surgery, wear comfortable clothes the morning of surgery  11. Place CLEAN SHEETS on your bed the night of your first shower and DO NOT SLEEP WITH PETS.  Day of Surgery:  Do not apply any deodorants/lotions.  Please wear clean clothes to the hospital/surgery center.   Remember to brush your teeth WITH YOUR REGULAR TOOTHPASTE.

## 2018-11-02 ENCOUNTER — Ambulatory Visit: Payer: BLUE CROSS/BLUE SHIELD | Admitting: Cardiology

## 2018-11-02 ENCOUNTER — Encounter: Payer: Self-pay | Admitting: *Deleted

## 2018-11-02 ENCOUNTER — Encounter: Payer: Self-pay | Admitting: Cardiology

## 2018-11-02 VITALS — BP 140/98 | Ht 70.0 in | Wt 237.6 lb

## 2018-11-02 DIAGNOSIS — R030 Elevated blood-pressure reading, without diagnosis of hypertension: Secondary | ICD-10-CM | POA: Diagnosis not present

## 2018-11-02 DIAGNOSIS — Z01810 Encounter for preprocedural cardiovascular examination: Secondary | ICD-10-CM

## 2018-11-02 NOTE — Patient Instructions (Signed)
Medication Instructions:  No changes If you need a refill on your cardiac medications before your next appointment, please call your pharmacy.   Lab work: none If you have labs (blood work) drawn today and your tests are completely normal, you will receive your results only by: Marland Kitchen MyChart Message (if you have MyChart) OR . A paper copy in the mail If you have any lab test that is abnormal or we need to change your treatment, we will call you to review the results.  Testing/Procedures: none  Follow-Up: Your physician recommends that you schedule a follow-up appointment in: as needed with Dr. Marlou Porch.  Any Other Special Instructions Will Be Listed Below (If Applicable). Monitor blood pressure at home, check a couple times per week, feet flat on the floor, after sitting quietly for several minutes.  Keep a record.  In about 2 months, please call or send MyChart message of readings.

## 2018-11-02 NOTE — Progress Notes (Signed)
Cardiology Office Note:    Date:  11/04/2018   ID:  Jeffrey Ayala, DOB 28-Jun-1961, MRN [id number]  PCP:  Patient, No Pcp Per  Cardiologist:  Candee Furbish, MD  Electrophysiologist:  None   Referring MD: [name], MD     History of Present Illness:    Jeffrey Ayala is a 57 y.o. male here for pre op cardiac evaluation prior to knee replacements, [name].   Sales, commision.   No DM, no CVA, no early CAD in family. 140/98 here.   Knee pain.   In yard few hours, leaf pickup.  Chops wood.  No difficulties.  No anginal symptoms.  No shortness of breath.  No fevers chills nausea vomiting syncope bleeding.  Blood pressure was elevated initially at our visit today.  He is not on any antihypertensive medications.  He has not been diagnosed with hypertension.  He does state that he has not been to the doctor in several years.  His wife had a kidney pancreas transplant and may have a blood pressure cuff at home.  Encouraged his use.  He is able to complete > 4 METS of activity without angina.       Past Medical History:  Diagnosis Date  . Concussion   . Headache(784.0)    Hx. Migrains  . History of kidney stones   . Renal stone    Hx. multiple kidne;y stones    Past Surgical History:  Procedure Laterality Date  . cystos with stents    . DENTAL SURGERY    . FINGER SURGERY    . HERNIA REPAIR     at age 21  . KNEE ARTHROSCOPY Left   . LITHOTRIPSY    . lithotripsy  07/2014  . NEPHROLITHOTOMY    . TONSILLECTOMY      Current Medications: Current Meds  Medication Sig  . acetaminophen (TYLENOL) 650 MG CR tablet Take 1,300 mg by mouth daily.  Marland Kitchen glucosamine-chondroitin 500-400 MG tablet Take 1 tablet by mouth daily.  . naproxen sodium (ALEVE) 220 MG tablet Take 440 mg by mouth daily.  . TURMERIC CURCUMIN PO Take 1 capsule by mouth daily.     Allergies:   Codeine and Flomax [tamsulosin hcl]   Social History   Socioeconomic History  . Marital status: Married     Spouse name: Not on file  . Number of children: Not on file  . Years of education: Not on file  . Highest education level: Not on file  Occupational History  . Not on file  Social Needs  . Financial resource strain: Not on file  . Food insecurity:    Worry: Not on file    Inability: Not on file  . Transportation needs:    Medical: Not on file    Non-medical: Not on file  Tobacco Use  . Smoking status: Never Smoker  . Smokeless tobacco: Never Used  Substance and Sexual Activity  . Alcohol use: Yes    Comment: occassional  . Drug use: No  . Sexual activity: Not on file  Lifestyle  . Physical activity:    Days per week: Not on file    Minutes per session: Not on file  . Stress: Not on file  Relationships  . Social connections:    Talks on phone: Not on file    Gets together: Not on file    Attends religious service: Not on file    Active member of club or organization: Not on  file    Attends meetings of clubs or organizations: Not on file    Relationship status: Not on file  Other Topics Concern  . Not on file  Social History Narrative  . Not on file     Family History: The patient's family history includes COPD in his mother.  ROS:   Please see the history of present illness.     All other systems reviewed and are negative.  EKGs/Labs/Other Studies Reviewed:    The following studies were reviewed today: Prior office notes lab work EKG  EKG:  EKG is not ordered today.  Prior on 10/28/2018 shows sinus rhythm with nonspecific ST-T wave changes, subtle T wave inversion noted in the lateral leads.  Recent Labs: 10/28/2018: BUN 12; Creatinine, Ser 0.98; Hemoglobin 13.4; Platelets 239; Potassium 3.6; Sodium 142  Recent Lipid Panel No results found for: CHOL, TRIG, HDL, CHOLHDL, VLDL, LDLCALC, LDLDIRECT  Physical Exam:    VS:  BP (!) 140/98   Ht 5\' 10"  (1.778 m)   Wt 237 lb 9.6 oz (107.8 kg)   BMI 34.09 kg/m     Wt Readings from Last 3 Encounters:    11/02/18 237 lb 9.6 oz (107.8 kg)  10/28/18 238 lb (108 kg)  10/17/18 220 lb (99.8 kg)     GEN: obese Well nourished, well developed in no acute distress HEENT: Normal NECK: No JVD; No carotid bruits LYMPHATICS: No lymphadenopathy CARDIAC: RRR, no murmurs, rubs, gallops RESPIRATORY:  Clear to auscultation without rales, wheezing or rhonchi  ABDOMEN: Soft, non-tender, non-distended MUSCULOSKELETAL:  No edema; No deformity  SKIN: Warm and dry NEUROLOGIC:  Alert and oriented x 3 PSYCHIATRIC:  Normal affect   ASSESSMENT:    1. Pre-operative cardiovascular examination   2. Elevated blood pressure reading    PLAN:    In order of problems listed above:  Preoperative cardiac risk stratification/abnormal EKG - He is able to complete greater than 4 METS of activity without anginal symptoms.  I do not hear any valvular issues on auscultation.  His EKG does show some nonspecific T wave changes.  Since he is not having any chest discomfort, these are fairly nonspecific.  Elevated blood pressure noted.  Encouraged continued monitoring of this.  Weight loss, diet, exercise would be helpful. -Based upon current assessment, he may proceed with bilateral knee replacement with low overall cardiac risk.  Elevated blood pressure without diagnosis of hypertension - Encouraged him to follow-up with a primary physician.  I encouraged him to check blood pressures at home.  Certainly there may be a whitecoat component to this.  However, he may need antihypertensive therapy.  I will have him monitor for the next month or 2 then message me with the results.  He also needs good preventive maintenance therapy.  He has not had a colonoscopy for instance.  We discussed decreasing salt.  Diet, exercise.       Medication Adjustments/Labs and Tests Ordered: Current medicines are reviewed at length with the patient today.  Concerns regarding medicines are outlined above.  No orders of the defined types were  placed in this encounter.  No orders of the defined types were placed in this encounter.   Patient Instructions  Medication Instructions:  No changes If you need a refill on your cardiac medications before your next appointment, please call your pharmacy.   Lab work: none If you have labs (blood work) drawn today and your tests are completely normal, you will receive your results only by: Marland Kitchen  MyChart Message (if you have MyChart) OR . A paper copy in the mail If you have any lab test that is abnormal or we need to change your treatment, we will call you to review the results.  Testing/Procedures: none  Follow-Up: Your physician recommends that you schedule a follow-up appointment in: as needed with Dr. Marlou Porch.  Any Other Special Instructions Will Be Listed Below (If Applicable). Monitor blood pressure at home, check a couple times per week, feet flat on the floor, after sitting quietly for several minutes.  Keep a record.  In about 2 months, please call or send MyChart message of readings.       Signed, Candee Furbish, MD  11/04/2018 5:24 PM    [place]

## 2018-11-03 ENCOUNTER — Encounter (HOSPITAL_COMMUNITY): Payer: Self-pay | Admitting: Anesthesiology

## 2018-11-03 MED ORDER — TRANEXAMIC ACID 1000 MG/10ML IV SOLN
2000.0000 mg | INTRAVENOUS | Status: AC
  Administered 2018-11-04: 2000 mg via TOPICAL
  Filled 2018-11-03 (×3): qty 20

## 2018-11-03 MED ORDER — TRANEXAMIC ACID-NACL 1000-0.7 MG/100ML-% IV SOLN
1000.0000 mg | INTRAVENOUS | Status: DC
  Filled 2018-11-03: qty 100

## 2018-11-03 MED ORDER — TRANEXAMIC ACID 1000 MG/10ML IV SOLN
2000.0000 mg | INTRAVENOUS | Status: DC
  Filled 2018-11-03: qty 20

## 2018-11-03 MED ORDER — TRANEXAMIC ACID-NACL 1000-0.7 MG/100ML-% IV SOLN
1000.0000 mg | INTRAVENOUS | Status: AC
  Administered 2018-11-04: 1000 mg via INTRAVENOUS
  Filled 2018-11-03 (×3): qty 100

## 2018-11-03 NOTE — Anesthesia Preprocedure Evaluation (Addendum)
Anesthesia Evaluation  Patient identified by MRN, date of birth, ID band Patient awake    Reviewed: Allergy & Precautions, NPO status , Patient's Chart, lab work & pertinent test results  Airway Mallampati: III  TM Distance: >3 FB Neck ROM: Full    Dental no notable dental hx. (+) Teeth Intact, Caps   Pulmonary  Snores- has never had Sleep study   Pulmonary exam normal breath sounds clear to auscultation       Cardiovascular hypertension, Normal cardiovascular exam Rhythm:Regular Rate:Normal  EKG- NSR with non specific lateral ST-T wave changes  Dr. Marlou Porch- Cardiac clearance noted   Neuro/Psych  Headaches, PSYCHIATRIC DISORDERS Depression Dysthymic disorder   GI/Hepatic negative GI ROS, Neg liver ROS,   Endo/Other  negative endocrine ROS  Renal/GU Renal diseaseHx/o renal calculi  negative genitourinary   Musculoskeletal  (+) Arthritis , Osteoarthritis,  OA Bilateral Knees   Abdominal (+) + obese,   Peds  Hematology negative hematology ROS (+)   Anesthesia Other Findings   Reproductive/Obstetrics                          Anesthesia Physical Anesthesia Plan  ASA: III  Anesthesia Plan: Combined Spinal and Epidural   Post-op Pain Management:    Induction:   PONV Risk Score and Plan: 2 and Propofol infusion, Ondansetron, Treatment may vary due to age or medical condition and Midazolam  Airway Management Planned: Natural Airway, Simple Face Mask and Nasal Cannula  Additional Equipment:   Intra-op Plan:   Post-operative Plan:   Informed Consent: I have reviewed the patients History and Physical, chart, labs and discussed the procedure including the risks, benefits and alternatives for the proposed anesthesia with the patient or authorized representative who has indicated his/her understanding and acceptance.   Dental advisory given  Plan Discussed with: CRNA and Surgeon  Anesthesia  Plan Comments: (Will leave epidural in place for 24 hours and run infusion of 0.2% Ropivacaine.)       Anesthesia Quick Evaluation

## 2018-11-04 ENCOUNTER — Encounter (HOSPITAL_COMMUNITY): Payer: Self-pay | Admitting: *Deleted

## 2018-11-04 ENCOUNTER — Encounter (HOSPITAL_COMMUNITY)
Admission: AD | Disposition: A | Discharge status: Home Health | Payer: Self-pay | Source: Home / Self Care | Attending: Orthopedic Surgery

## 2018-11-04 ENCOUNTER — Ambulatory Visit (HOSPITAL_COMMUNITY): Payer: BLUE CROSS/BLUE SHIELD | Admitting: Anesthesiology

## 2018-11-04 ENCOUNTER — Inpatient Hospital Stay (HOSPITAL_COMMUNITY)
Admission: AD | Admit: 2018-11-04 | Discharge: 2018-11-07 | DRG: 462 | Disposition: A | Discharge status: Home Health | Payer: BLUE CROSS/BLUE SHIELD | Attending: Orthopedic Surgery | Admitting: Orthopedic Surgery

## 2018-11-04 ENCOUNTER — Ambulatory Visit (HOSPITAL_COMMUNITY): Payer: BLUE CROSS/BLUE SHIELD | Admitting: Physician Assistant

## 2018-11-04 ENCOUNTER — Encounter: Payer: Self-pay | Admitting: Cardiology

## 2018-11-04 DIAGNOSIS — Z6834 Body mass index (BMI) 34.0-34.9, adult: Secondary | ICD-10-CM | POA: Diagnosis not present

## 2018-11-04 DIAGNOSIS — F329 Major depressive disorder, single episode, unspecified: Secondary | ICD-10-CM | POA: Diagnosis not present

## 2018-11-04 DIAGNOSIS — Z87442 Personal history of urinary calculi: Secondary | ICD-10-CM

## 2018-11-04 DIAGNOSIS — E669 Obesity, unspecified: Secondary | ICD-10-CM | POA: Diagnosis not present

## 2018-11-04 DIAGNOSIS — Z885 Allergy status to narcotic agent status: Secondary | ICD-10-CM | POA: Diagnosis not present

## 2018-11-04 DIAGNOSIS — Z888 Allergy status to other drugs, medicaments and biological substances status: Secondary | ICD-10-CM

## 2018-11-04 DIAGNOSIS — Z8619 Personal history of other infectious and parasitic diseases: Secondary | ICD-10-CM

## 2018-11-04 DIAGNOSIS — Z825 Family history of asthma and other chronic lower respiratory diseases: Secondary | ICD-10-CM

## 2018-11-04 DIAGNOSIS — I493 Ventricular premature depolarization: Secondary | ICD-10-CM | POA: Diagnosis present

## 2018-11-04 DIAGNOSIS — M170 Bilateral primary osteoarthritis of knee: Principal | ICD-10-CM

## 2018-11-04 DIAGNOSIS — F419 Anxiety disorder, unspecified: Secondary | ICD-10-CM | POA: Diagnosis present

## 2018-11-04 DIAGNOSIS — I10 Essential (primary) hypertension: Secondary | ICD-10-CM | POA: Diagnosis not present

## 2018-11-04 DIAGNOSIS — Z79899 Other long term (current) drug therapy: Secondary | ICD-10-CM

## 2018-11-04 DIAGNOSIS — R42 Dizziness and giddiness: Secondary | ICD-10-CM | POA: Diagnosis not present

## 2018-11-04 DIAGNOSIS — R000 Tachycardia, unspecified: Secondary | ICD-10-CM | POA: Diagnosis not present

## 2018-11-04 DIAGNOSIS — I491 Atrial premature depolarization: Secondary | ICD-10-CM | POA: Diagnosis not present

## 2018-11-04 DIAGNOSIS — F341 Dysthymic disorder: Secondary | ICD-10-CM | POA: Diagnosis present

## 2018-11-04 DIAGNOSIS — Z96653 Presence of artificial knee joint, bilateral: Secondary | ICD-10-CM

## 2018-11-04 DIAGNOSIS — R030 Elevated blood-pressure reading, without diagnosis of hypertension: Secondary | ICD-10-CM | POA: Diagnosis not present

## 2018-11-04 DIAGNOSIS — G8918 Other acute postprocedural pain: Secondary | ICD-10-CM | POA: Diagnosis not present

## 2018-11-04 HISTORY — PX: TOTAL KNEE ARTHROPLASTY: SHX125

## 2018-11-04 HISTORY — DX: Migraine, unspecified, not intractable, without status migrainosus: G43.909

## 2018-11-04 LAB — TROPONIN I: Troponin I: <0.03 ng/mL (ref <0.03)

## 2018-11-04 SURGERY — ARTHROPLASTY, KNEE, BILATERAL, TOTAL
Anesthesia: Spinal | Site: Knee | Laterality: Bilateral

## 2018-11-04 MED ORDER — LACTATED RINGERS IV SOLN
INTRAVENOUS | Status: DC | PRN
  Administered 2018-11-04 (×2): via INTRAVENOUS

## 2018-11-04 MED ORDER — TRANEXAMIC ACID 1000 MG/10ML IV SOLN
2000.0000 mg | INTRAVENOUS | Status: AC
  Administered 2018-11-04: 2000 mg via TOPICAL
  Filled 2018-11-04: qty 20

## 2018-11-04 MED ORDER — PROPOFOL 500 MG/50ML IV EMUL
INTRAVENOUS | Status: DC | PRN
  Administered 2018-11-04: 60 ug/kg/min via INTRAVENOUS

## 2018-11-04 MED ORDER — PROPOFOL 1000 MG/100ML IV EMUL
INTRAVENOUS | Status: AC
  Filled 2018-11-04: qty 100

## 2018-11-04 MED ORDER — METOCLOPRAMIDE HCL 5 MG PO TABS: 5.0000 mg | ORAL_TABLET | Freq: Three times a day (TID) | ORAL | Status: DC | PRN

## 2018-11-04 MED ORDER — MIDAZOLAM HCL 2 MG/2ML IJ SOLN
INTRAMUSCULAR | Status: AC
  Filled 2018-11-04: qty 2

## 2018-11-04 MED ORDER — METOCLOPRAMIDE HCL 5 MG/ML IJ SOLN
5.0000 mg | Freq: Three times a day (TID) | INTRAMUSCULAR | Status: DC | PRN
  Administered 2018-11-05 – 2018-11-06 (×2): 10 mg via INTRAVENOUS
  Filled 2018-11-04 (×2): qty 2

## 2018-11-04 MED ORDER — 0.9 % SODIUM CHLORIDE (POUR BTL) OPTIME
TOPICAL | Status: DC | PRN
  Administered 2018-11-04: 1000 mL

## 2018-11-04 MED ORDER — PROPOFOL 10 MG/ML IV BOLUS
INTRAVENOUS | Status: AC
  Filled 2018-11-04: qty 40

## 2018-11-04 MED ORDER — CEFAZOLIN SODIUM-DEXTROSE 2-4 GM/100ML-% IV SOLN
2.0000 g | INTRAVENOUS | Status: AC
  Administered 2018-11-04: 2 g via INTRAVENOUS
  Filled 2018-11-04: qty 100

## 2018-11-04 MED ORDER — PHENOL 1.4 % MT LIQD: 1.0000 | OROMUCOSAL | Status: DC | PRN

## 2018-11-04 MED ORDER — FENTANYL CITRATE (PF) 100 MCG/2ML IJ SOLN
INTRAMUSCULAR | Status: DC | PRN
  Administered 2018-11-04: 100 ug via INTRAVENOUS

## 2018-11-04 MED ORDER — HYDROMORPHONE HCL 1 MG/ML IJ SOLN
0.5000 mg | INTRAMUSCULAR | Status: DC | PRN
  Administered 2018-11-04 – 2018-11-06 (×3): 1 mg via INTRAVENOUS
  Filled 2018-11-04 (×3): qty 1

## 2018-11-04 MED ORDER — MEPERIDINE HCL 50 MG/ML IJ SOLN
INTRAMUSCULAR | Status: AC
  Filled 2018-11-04: qty 1

## 2018-11-04 MED ORDER — METHOCARBAMOL 1000 MG/10ML IJ SOLN
500.0000 mg | Freq: Four times a day (QID) | INTRAVENOUS | Status: DC | PRN
  Filled 2018-11-04: qty 5

## 2018-11-04 MED ORDER — FENTANYL CITRATE (PF) 250 MCG/5ML IJ SOLN
INTRAMUSCULAR | Status: AC
  Filled 2018-11-04: qty 5

## 2018-11-04 MED ORDER — BISACODYL 10 MG RE SUPP: 10.0000 mg | Freq: Every day | RECTAL | Status: DC | PRN

## 2018-11-04 MED ORDER — CHLORHEXIDINE GLUCONATE 4 % EX LIQD: 60.0000 mL | Freq: Once | CUTANEOUS | Status: DC

## 2018-11-04 MED ORDER — CEFAZOLIN SODIUM-DEXTROSE 1-4 GM/50ML-% IV SOLN
1.0000 g | Freq: Four times a day (QID) | INTRAVENOUS | Status: AC
  Administered 2018-11-04 – 2018-11-05 (×2): 1 g via INTRAVENOUS
  Filled 2018-11-04 (×2): qty 50

## 2018-11-04 MED ORDER — ALBUMIN HUMAN 5 % IV SOLN
INTRAVENOUS | Status: AC
  Filled 2018-11-04: qty 250

## 2018-11-04 MED ORDER — MIDAZOLAM HCL 5 MG/5ML IJ SOLN
INTRAMUSCULAR | Status: DC | PRN
  Administered 2018-11-04: 2 mg via INTRAVENOUS

## 2018-11-04 MED ORDER — ACETAMINOPHEN 325 MG PO TABS
325.0000 mg | ORAL_TABLET | Freq: Four times a day (QID) | ORAL | Status: DC | PRN
  Administered 2018-11-05: 650 mg via ORAL
  Filled 2018-11-04: qty 2

## 2018-11-04 MED ORDER — OXYCODONE-ACETAMINOPHEN 5-325 MG PO TABS: 1.0000 | ORAL_TABLET | ORAL | 0 refills | Status: DC | PRN

## 2018-11-04 MED ORDER — SODIUM CHLORIDE 0.9 % IV SOLN
INTRAVENOUS | Status: DC | PRN
  Administered 2018-11-04: 40 ug/min via INTRAVENOUS
  Administered 2018-11-04: 20 ug/min via INTRAVENOUS

## 2018-11-04 MED ORDER — ROPIVACAINE HCL 2 MG/ML IJ SOLN
10.0000 mL/h | INTRAMUSCULAR | Status: DC
  Administered 2018-11-06: 10 mL/h via EPIDURAL
  Filled 2018-11-04 (×5): qty 200

## 2018-11-04 MED ORDER — ONDANSETRON HCL 4 MG/2ML IJ SOLN
INTRAMUSCULAR | Status: DC | PRN
  Administered 2018-11-04: 4 mg via INTRAVENOUS

## 2018-11-04 MED ORDER — ROPIVACAINE HCL 2 MG/ML IJ SOLN
2.0000 mL/h | INTRAMUSCULAR | Status: DC
  Administered 2018-11-04: 10 mL/h via EPIDURAL
  Filled 2018-11-04: qty 200

## 2018-11-04 MED ORDER — EPHEDRINE 5 MG/ML INJ
INTRAVENOUS | Status: AC
  Filled 2018-11-04: qty 10

## 2018-11-04 MED ORDER — ONDANSETRON HCL 4 MG/2ML IJ SOLN: 4.0000 mg | Freq: Once | INTRAMUSCULAR | Status: DC | PRN

## 2018-11-04 MED ORDER — HYDROMORPHONE HCL 1 MG/ML IJ SOLN
INTRAMUSCULAR | Status: AC
  Filled 2018-11-04: qty 1

## 2018-11-04 MED ORDER — ONDANSETRON HCL 4 MG/2ML IJ SOLN
4.0000 mg | Freq: Four times a day (QID) | INTRAMUSCULAR | Status: DC | PRN
  Administered 2018-11-06: 4 mg via INTRAVENOUS
  Filled 2018-11-04: qty 2

## 2018-11-04 MED ORDER — METHOCARBAMOL 500 MG PO TABS
500.0000 mg | ORAL_TABLET | Freq: Four times a day (QID) | ORAL | Status: DC | PRN
  Administered 2018-11-05 – 2018-11-07 (×4): 500 mg via ORAL
  Filled 2018-11-04 (×5): qty 1

## 2018-11-04 MED ORDER — MEPERIDINE HCL 50 MG/ML IJ SOLN
6.2500 mg | INTRAMUSCULAR | Status: DC | PRN
  Administered 2018-11-04 (×2): 12.5 mg via INTRAVENOUS

## 2018-11-04 MED ORDER — MAGNESIUM CITRATE PO SOLN: 1.0000 | Freq: Once | ORAL | Status: DC | PRN

## 2018-11-04 MED ORDER — MENTHOL 3 MG MT LOZG: 1.0000 | LOZENGE | OROMUCOSAL | Status: DC | PRN

## 2018-11-04 MED ORDER — EPHEDRINE SULFATE-NACL 50-0.9 MG/10ML-% IV SOSY
PREFILLED_SYRINGE | INTRAVENOUS | Status: DC | PRN
  Administered 2018-11-04: 5 mg via INTRAVENOUS

## 2018-11-04 MED ORDER — MEPERIDINE HCL 50 MG/ML IJ SOLN: 6.2500 mg | INTRAMUSCULAR | Status: DC | PRN

## 2018-11-04 MED ORDER — ONDANSETRON HCL 4 MG PO TABS: 4.0000 mg | ORAL_TABLET | Freq: Four times a day (QID) | ORAL | Status: DC | PRN

## 2018-11-04 MED ORDER — OXYCODONE HCL 5 MG PO TABS
5.0000 mg | ORAL_TABLET | ORAL | Status: DC | PRN
  Filled 2018-11-04: qty 2

## 2018-11-04 MED ORDER — OXYCODONE HCL 5 MG PO TABS
10.0000 mg | ORAL_TABLET | ORAL | Status: DC | PRN
  Administered 2018-11-04: 10 mg via ORAL
  Filled 2018-11-04: qty 3

## 2018-11-04 MED ORDER — DOCUSATE SODIUM 100 MG PO CAPS
100.0000 mg | ORAL_CAPSULE | Freq: Two times a day (BID) | ORAL | Status: DC
  Administered 2018-11-04 – 2018-11-07 (×6): 100 mg via ORAL
  Filled 2018-11-04 (×6): qty 1

## 2018-11-04 MED ORDER — CEFAZOLIN SODIUM-DEXTROSE 2-4 GM/100ML-% IV SOLN
INTRAVENOUS | Status: AC
  Filled 2018-11-04: qty 100

## 2018-11-04 MED ORDER — SODIUM CHLORIDE 0.9 % IV SOLN
INTRAVENOUS | Status: DC
  Administered 2018-11-04: 18:00:00 via INTRAVENOUS

## 2018-11-04 MED ORDER — ALBUMIN HUMAN 5 % IV SOLN
12.5000 g | Freq: Once | INTRAVENOUS | Status: AC
  Administered 2018-11-04: 12.5 g via INTRAVENOUS

## 2018-11-04 MED ORDER — PROPOFOL 10 MG/ML IV BOLUS
INTRAVENOUS | Status: DC | PRN
  Administered 2018-11-04 (×3): 20 mg via INTRAVENOUS

## 2018-11-04 MED ORDER — SODIUM CHLORIDE 0.9 % IR SOLN
Status: DC | PRN
  Administered 2018-11-04: 3000 mL

## 2018-11-04 MED ORDER — POLYETHYLENE GLYCOL 3350 17 G PO PACK: 17.0000 g | PACK | Freq: Every day | ORAL | Status: DC | PRN

## 2018-11-04 MED ORDER — HYDROMORPHONE HCL 1 MG/ML IJ SOLN
0.2500 mg | INTRAMUSCULAR | Status: DC | PRN
  Administered 2018-11-04 (×2): 0.5 mg via INTRAVENOUS

## 2018-11-04 MED ORDER — BUPIVACAINE IN DEXTROSE 0.75-8.25 % IT SOLN
INTRATHECAL | Status: DC | PRN
  Administered 2018-11-04: 2 mL via INTRATHECAL

## 2018-11-04 MED ORDER — ONDANSETRON HCL 4 MG/2ML IJ SOLN
INTRAMUSCULAR | Status: AC
  Filled 2018-11-04: qty 2

## 2018-11-04 MED ORDER — ASPIRIN EC 325 MG PO TBEC
325.0000 mg | DELAYED_RELEASE_TABLET | Freq: Every day | ORAL | Status: DC
  Administered 2018-11-05 – 2018-11-07 (×3): 325 mg via ORAL
  Filled 2018-11-04 (×3): qty 1

## 2018-11-04 MED ORDER — ROPIVACAINE HCL 2 MG/ML IJ SOLN: 8.0000 mL/h | INTRAMUSCULAR | Status: DC

## 2018-11-04 SURGICAL SUPPLY — 22 items
ATTUNE MED DOME PAT 38 KNEE (Knees) ×2 IMPLANT
ATTUNE MED DOME PAT 38MM KNEE (Knees) ×2 IMPLANT
ATTUNE PS FEM LT SZ 6 CEM KNEE (Femur) ×2 IMPLANT
ATTUNE PS FEM RT SZ 6 CEM KNEE (Femur) ×2 IMPLANT
BAG DECANTER FOR FLEXI CONT (MISCELLANEOUS) ×4 IMPLANT
BASE TIBIAL CEM ATTUNE SZ 7 (Knees) ×6 IMPLANT
BASEPLATE TIB CEM ATTUNE SZ7 (Knees) IMPLANT
BNDG COHESIVE 6X5 TAN STRL LF (GAUZE/BANDAGES/DRESSINGS) ×4 IMPLANT
BSPLAT TIB 7 CMNT FX BRNG STRL (Knees) ×2 IMPLANT
CEMENT BONE R 1X40 (Cement) ×8 IMPLANT
COVER SURGICAL LIGHT HANDLE (MISCELLANEOUS) ×2 IMPLANT
CUFF TOURN SGL QUICK 34 (TOURNIQUET CUFF) ×6
CUFF TRNQT CYL 34X4.125X (TOURNIQUET CUFF) IMPLANT
DRAPE INCISE IOBAN 66X45 STRL (DRAPES) ×6 IMPLANT
INSERT TIB ATTUNE FB SZ6X7 (Insert) ×4 IMPLANT
KIT DRSG PREVENA PLUS 7DAY 125 (MISCELLANEOUS) ×4 IMPLANT
PIN STEINMAN FIXATION KNEE (PIN) ×2 IMPLANT
PIN THREADED HEADED SIGMA (PIN) ×2 IMPLANT
PREVENA RESTOR ARTHOFORM 46X30 (CANNISTER) ×4 IMPLANT
SPONGE LAP 18X18 RF (DISPOSABLE) ×4 IMPLANT
TRAY FOLEY CATH 16FRSI W/METER (SET/KITS/TRAYS/PACK) ×2 IMPLANT
WRAP KNEE MAXI GEL POST OP (GAUZE/BANDAGES/DRESSINGS) ×4 IMPLANT

## 2018-11-04 NOTE — Anesthesia Procedure Notes (Signed)
Procedure Name: MAC Date/Time: 11/04/2018 7:57 AM Performed by: Josephine Igo, MD Pre-anesthesia Checklist: Patient identified, Emergency Drugs available, Suction available, Patient being monitored and Timeout performed Oxygen Delivery Method: Simple face mask Placement Confirmation: positive ETCO2 Dental Injury: Teeth and Oropharynx as per pre-operative assessment

## 2018-11-04 NOTE — Discharge Instructions (Signed)
INSTRUCTIONS AFTER JOINT REPLACEMENT   o Remove items at home which could result in a fall. This includes throw rugs or furniture in walking pathways o ICE to the affected joint every three hours while awake for 30 minutes at a time, for at least the first 3-5 days, and then as needed for pain and swelling.  Continue to use ice for pain and swelling. You may notice swelling that will progress down to the foot and ankle.  This is normal after surgery.  Elevate your leg when you are not up walking on it.   o Continue to use the breathing machine you got in the hospital (incentive spirometer) which will help keep your temperature down.  It is common for your temperature to cycle up and down following surgery, especially at night when you are not up moving around and exerting yourself.  The breathing machine keeps your lungs expanded and your temperature down.   DIET:  As you were doing prior to hospitalization, we recommend a well-balanced diet.  DRESSING / WOUND CARE / SHOWERING  Do not remove wound vac  ACTIVITY  o Increase activity slowly as tolerated, but follow the weight bearing instructions below.   o No driving for 6 weeks or until further direction given by your physician.  You cannot drive while taking narcotics.  o No lifting or carrying greater than 10 lbs. until further directed by your surgeon. o Avoid periods of inactivity such as sitting longer than an hour when not asleep. This helps prevent blood clots.  o You may return to work once you are authorized by your doctor.     WEIGHT BEARING   Weight bearing as tolerated with assist device (walker, cane, etc) as directed, use it as long as suggested by your surgeon or therapist, typically at least 4-6 weeks.   EXERCISES  Results after joint replacement surgery are often greatly improved when you follow the exercise, range of motion and muscle strengthening exercises prescribed by your doctor. Safety measures are also important  to protect the joint from further injury. Any time any of these exercises cause you to have increased pain or swelling, decrease what you are doing until you are comfortable again and then slowly increase them. If you have problems or questions, call your caregiver or physical therapist for advice.   Rehabilitation is important following a joint replacement. After just a few days of immobilization, the muscles of the leg can become weakened and shrink (atrophy).  These exercises are designed to build up the tone and strength of the thigh and leg muscles and to improve motion. Often times heat used for twenty to thirty minutes before working out will loosen up your tissues and help with improving the range of motion but do not use heat for the first two weeks following surgery (sometimes heat can increase post-operative swelling).   These exercises can be done on a training (exercise) mat, on the floor, on a table or on a bed. Use whatever works the best and is most comfortable for you.    Use music or television while you are exercising so that the exercises are a pleasant break in your day. This will make your life better with the exercises acting as a break in your routine that you can look forward to.   Perform all exercises about fifteen times, three times per day or as directed.  You should exercise both the operative leg and the other leg as well.  Exercises include:  Quad Sets - Tighten up the muscle on the front of the thigh (Quad) and hold for 5-10 seconds.    Straight Leg Raises - With your knee straight (if you were given a brace, keep it on), lift the leg to 60 degrees, hold for 3 seconds, and slowly lower the leg.  Perform this exercise against resistance later as your leg gets stronger.   Leg Slides: Lying on your back, slowly slide your foot toward your buttocks, bending your knee up off the floor (only go as far as is comfortable). Then slowly slide your foot back down until your leg  is flat on the floor again.   Angel Wings: Lying on your back spread your legs to the side as far apart as you can without causing discomfort.   Hamstring Strength:  Lying on your back, push your heel against the floor with your leg straight by tightening up the muscles of your buttocks.  Repeat, but this time bend your knee to a comfortable angle, and push your heel against the floor.  You may put a pillow under the heel to make it more comfortable if necessary.   A rehabilitation program following joint replacement surgery can speed recovery and prevent re-injury in the future due to weakened muscles. Contact your doctor or a physical therapist for more information on knee rehabilitation.    CONSTIPATION  Constipation is defined medically as fewer than three stools per week and severe constipation as less than one stool per week.  Even if you have a regular bowel pattern at home, your normal regimen is likely to be disrupted due to multiple reasons following surgery.  Combination of anesthesia, postoperative narcotics, change in appetite and fluid intake all can affect your bowels.   YOU MUST use at least one of the following options; they are listed in order of increasing strength to get the job done.  They are all available over the counter, and you may need to use some, POSSIBLY even all of these options:    Drink plenty of fluids (prune juice may be helpful) and high fiber foods Colace 100 mg by mouth twice a day  Senokot for constipation as directed and as needed Dulcolax (bisacodyl), take with full glass of water  Miralax (polyethylene glycol) once or twice a day as needed.  If you have tried all these things and are unable to have a bowel movement in the first 3-4 days after surgery call either your surgeon or your primary doctor.    If you experience loose stools or diarrhea, hold the medications until you stool forms back up.  If your symptoms do not get better within 1 week or if  they get worse, check with your doctor.  If you experience "the worst abdominal pain ever" or develop nausea or vomiting, please contact the office immediately for further recommendations for treatment.   ITCHING:  If you experience itching with your medications, try taking only a single pain pill, or even half a pain pill at a time.  You can also use Benadryl over the counter for itching or also to help with sleep.   TED HOSE STOCKINGS:  Use stockings on both legs until for at least 2 weeks or as directed by physician office. They may be removed at night for sleeping.  MEDICATIONS:  See your medication summary on the After Visit Summary that nursing will review with you.  You may have some home medications which will be placed on hold until  you complete the course of blood thinner medication.  It is important for you to complete the blood thinner medication as prescribed. ° °PRECAUTIONS:  If you experience chest pain or shortness of breath - call 911 immediately for transfer to the hospital emergency department.  ° °If you develop a fever greater that 101 F, purulent drainage from wound, increased redness or drainage from wound, foul odor from the wound/dressing, or calf pain - CONTACT YOUR SURGEON.   °                                                °FOLLOW-UP APPOINTMENTS:  If you do not already have a post-op appointment, please call the office for an appointment to be seen by your surgeon.  Guidelines for how soon to be seen are listed in your “After Visit Summary”, but are typically between 1-4 weeks after surgery. ° °OTHER INSTRUCTIONS:  ° °Knee Replacement:  Do not place pillow under knee, focus on keeping the knee straight while resting. CPM instructions: 0-90 degrees, 2 hours in the morning, 2 hours in the afternoon, and 2 hours in the evening. Place foam block, curve side up under heel at all times except when in CPM or when walking.  DO NOT modify, tear, cut, or change the foam block in any  way. ° °MAKE SURE YOU:  °• Understand these instructions.  °• Get help right away if you are not doing well or get worse.  ° ° °Thank you for letting us be a part of your medical care team.  It is a privilege we respect greatly.  We hope these instructions will help you stay on track for a fast and full recovery!  ° °

## 2018-11-04 NOTE — Addendum Note (Signed)
Addendum  created 11/04/18 1601 by Josephine Igo, MD   Order list changed, Order sets accessed

## 2018-11-04 NOTE — Op Note (Signed)
DATE OF SURGERY:  11/04/2018  TIME: 10:35 AM  PATIENT NAME:  Jeffrey Ayala    AGE: 57 y.o.    PRE-OPERATIVE DIAGNOSIS:  Osteoarthritis Bilateral Knees  POST-OPERATIVE DIAGNOSIS:  Osteoarthritis Bilateral Knees  PROCEDURE:  Procedure(s): BILATERAL TOTAL KNEE ARTHROPLASTY  SURGEON: Meridee Score  ASSISTANT: April Green  OPERATIVE IMPLANTS: Depuy , Posterior Stabilized.  Femur size 6, Tibia size 7, Patella size 38 3-peg oval button, with a 38 mm polyethylene insert.  @ENCIMAGES @          PREOPERATIVE INDICATIONS:   Jeffrey Ayala is a 57 y.o. year old male with end stage degenerative arthritis of the knee who failed conservative treatment and elected for Total Knee Arthroplasty.   The risks, benefits, and alternatives were discussed at length including but not limited to the risks of infection, bleeding, nerve injury, stiffness, blood clots, the need for revision surgery, cardiopulmonary complications, among others, and they were willing to proceed.  OPERATIVE DESCRIPTION:  The patient was brought to the operative room and placed in a supine position.  Spinal anesthesia was administered.  IV antibiotics were given.  The lower extremity was prepped and draped in the usual sterile fashion.  Jeffrey Ayala was used to cover all exposed skin. Time out was performed.    Anterior quadriceps tendon splitting approach was performed.  The patella was everted and osteophytes were removed.  The anterior horn of the medial and lateral meniscus was removed.   The distal femur was opened with the drill and the intramedullary distal femoral cutting jig was utilized, set at 5 degrees valgus resecting 9 mm off the distal femur.  Care was taken to protect the collateral ligaments.  Then the extramedullary tibial cutting jig was utilized set for 3 degree posterior slope.  Care was taken during the cut to protect the medial and collateral ligaments.  The proximal tibia was removed along with the  posterior horns of the menisci.  The PCL was sacrificed.    The extensor gap was measured and was approximately 7 mm.    The distal femoral sizing jig was applied, taking care to avoid notching.  Then the 4-in-1 cutting jig was applied and the anterior and posterior femur was cut, along with the chamfer cuts.  All posterior osteophytes were removed.  The flexion gap was then measured and was symmetric with the extension gap.  The distal femoral preparation using the appropriate jig to prepare the box.  The patella was then measured, and cut with the saw.    The proximal tibia sized and prepared accordingly with the reamer and the punch, and then all components were trialed with the poly insert.  The knee was found to have stable balance and full motion.  The knee was irrigated with normal saline and the knee was soaked with TXA.  The above named components were then cemented into place and all excess cement was removed.  The final polyethylene component was in place during cementation.  The knee was kept in extension until the cement hardened.  The knee was then taken through a range of motion and the patella tracked well and the knee irrigated copiously and the parapatellar and subcutaneous tissue closed with vicryl, and skin closed with staples..  A Prevena Restor sterile dressing was applied and patient  was taken to the PACU in stable  condition.  There were no complications.  Total tourniquet time was 0 minutes.  This was performed bilaterally with the same component sizes

## 2018-11-04 NOTE — H&P (Signed)
TOTAL KNEE ADMISSION H&P  Patient is being admitted for bilaterally total knee arthroplasty.  Subjective:  Chief Complaint:bilaterally knee pain.  HPI: Jeffrey Ayala, 57 y.o. male, has a history of pain and functional disability in the bilaterally knee due to arthritis and has failed non-surgical conservative treatments for greater than 12 weeks to includeNSAID's and/or analgesics, corticosteriod injections, weight reduction as appropriate and activity modification.  Onset of symptoms was gradual, starting 8 years ago with gradually worsening course since that time. The patient noted no past surgery on the bilaterally knee(s).  Patient currently rates pain in the bilaterally knee(s) at 8 out of 10 with activity. Patient has night pain, worsening of pain with activity and weight bearing, pain that interferes with activities of daily living, pain with passive range of motion, crepitus and joint swelling.  Patient has evidence of subchondral cysts, subchondral sclerosis, periarticular osteophytes, joint subluxation and joint space narrowing by imaging studies. This patient has had avascular necrosis of the knee. There is no active infection.  Patient Active Problem List   Diagnosis Date Noted  . Rupture of medial head of gastrocnemius, left, sequela 01/14/2017  . Kidney stones 06/22/2016  . HYPERTENSION 08/01/2010  . DYSTHYMIC DISORDER 07/08/2010  . PALPITATIONS 07/08/2010   Past Medical History:  Diagnosis Date  . Concussion   . Headache(784.0)    Hx. Migrains  . History of kidney stones   . Renal stone    Hx. multiple kidne;y stones    Past Surgical History:  Procedure Laterality Date  . cystos with stents    . DENTAL SURGERY    . FINGER SURGERY    . HERNIA REPAIR     at age 54  . KNEE ARTHROSCOPY Left   . LITHOTRIPSY    . lithotripsy  07/2014  . NEPHROLITHOTOMY    . TONSILLECTOMY      Current Facility-Administered Medications  Medication Dose Route Frequency Provider Last  Rate Last Dose  . ceFAZolin (ANCEF) 2-4 GM/100ML-% IVPB           . ceFAZolin (ANCEF) IVPB 2g/100 mL premix  2 g Intravenous On Call to OR Rayburn, Neta Mends, PA-C      . chlorhexidine (HIBICLENS) 4 % liquid 4 application  60 mL Topical Once Rayburn, Neta Mends, PA-C      . tranexamic acid (CYKLOKAPRON) 2,000 mg in sodium chloride 0.9 % 50 mL Topical Application  7,510 mg Topical To OR [name], MD      . tranexamic acid (CYKLOKAPRON) IVPB 1,000 mg  1,000 mg Intravenous To OR [name], MD       Allergies  Allergen Reactions  . Codeine Hives    Insomnia, jittery  . Flomax [Tamsulosin Hcl] Nausea Only    Nausea and fatigue    Social History   Tobacco Use  . Smoking status: Never Smoker  . Smokeless tobacco: Never Used  Substance Use Topics  . Alcohol use: Yes    Comment: occassional    Family History  Problem Relation Age of Onset  . COPD Mother      Review of Systems  All other systems reviewed and are negative.   Objective:  Physical Exam  Vital signs in last 24 hours: Temp:  [98.2 F (36.8 C)] 98.2 F (36.8 C) (12/13 0642) Pulse Rate:  [84] 84 (12/13 0642) Resp:  [20] 20 (12/13 0642) BP: (191)/(94) 191/94 (12/13 0642) SpO2:  [100 %] 100 % (12/13 0642)  Labs:   Estimated body  mass index is 34.09 kg/m as calculated from the following:   Height as of this encounter: 5\' 10"  (1.778 m).   Weight as of 11/02/18: 107.8 kg.   Imaging Review Plain radiographs demonstrate moderate degenerative joint disease of the bilaterally knee(s). The overall alignment ismild varus. The bone quality appears to be adequate for age and reported activity level.   Preoperative templating of the joint replacement has been completed, documented, and submitted to the Operating Room personnel in order to optimize intra-operative equipment management.    Patient's anticipated LOS is less than 2 midnights, meeting these requirements: - Younger than 63 - Lives  within 1 hour of care - Has a competent adult at home to recover with post-op recover - NO history of  - Chronic pain requiring opiods  - Diabetes  - Coronary Artery Disease  - Heart failure  - Heart attack  - Stroke  - DVT/VTE  - Cardiac arrhythmia  - Respiratory Failure/COPD  - Renal failure  - Anemia  - Advanced Liver disease        Assessment/Plan:  End stage arthritis, bilaterally knee   The patient history, physical examination, clinical judgment of the provider and imaging studies are consistent with end stage degenerative joint disease of the bilaterally knee(s) and total knee arthroplasty is deemed medically necessary. The treatment options including medical management, injection therapy arthroscopy and arthroplasty were discussed at length. The risks and benefits of total knee arthroplasty were presented and reviewed. The risks due to aseptic loosening, infection, stiffness, patella tracking problems, thromboembolic complications and other imponderables were discussed. The patient acknowledged the explanation, agreed to proceed with the plan and consent was signed. Patient is being admitted for inpatient treatment for surgery, pain control, PT, OT, prophylactic antibiotics, VTE prophylaxis, progressive ambulation and ADL's and discharge planning. The patient is planning to be discharged home with home health services

## 2018-11-04 NOTE — Anesthesia Postprocedure Evaluation (Signed)
Anesthesia Post Note  Patient: KREIG PARSON  Procedure(s) Performed: BILATERAL TOTAL KNEE ARTHROPLASTY (Bilateral Knee)     Patient location during evaluation: PACU Anesthesia Type: Combined Spinal/Epidural Level of consciousness: awake and alert Pain management: pain level controlled Vital Signs Assessment: post-procedure vital signs reviewed and stable Respiratory status: spontaneous breathing, nonlabored ventilation, respiratory function stable and patient connected to nasal cannula oxygen Cardiovascular status: blood pressure returned to baseline and stable Postop Assessment: no apparent nausea or vomiting Anesthetic complications: no Comments: Patient had some left shoulder pain in PACU. 12 lead EKG obtained showed NSR with prolonged QT and Twave inversion lateral leads not significantly changed from preop. Troponin was negative. Patient is stable for discharge to his room.    Last Vitals:  Vitals:   11/04/18 1245 11/04/18 1248  BP:  113/67  Pulse: 90 68  Resp: 13 18  Temp:    SpO2: 100% 100%    Last Pain:  Vitals:   11/04/18 1100  TempSrc:   PainSc: 0-No pain                 Perfecto,Finola Rosal A.

## 2018-11-04 NOTE — Progress Notes (Signed)
Patient ID: Jeffrey Ayala, male   DOB: 11/01/1961, 57 y.o.   MRN: [id number] Patient has epidural catheter that will need to be removed by anesthesia before discharge, continue wound vacs, discharge Saturday or Sunday.   Patient's anticipated LOS is less than 2 midnights, meeting these requirements: - Younger than 102 - Lives within 1 hour of care - Has a competent adult at home to recover with post-op recover - NO history of  - Chronic pain requiring opiods  - Diabetes  - Coronary Artery Disease  - Heart failure  - Heart attack  - Stroke  - DVT/VTE  - Cardiac arrhythmia  - Respiratory Failure/COPD  - Renal failure  - Anemia  - Advanced Liver disease

## 2018-11-04 NOTE — Anesthesia Procedure Notes (Signed)
Combined Spinal Epidural  Patient location during procedure: OR Start time: 11/04/2018 7:52 AM End time: 11/04/2018 8:00 AM Staffing Anesthesiologist: Josephine Igo, MD Performed: anesthesiologist  Preanesthetic Checklist Completed: patient identified, site marked, surgical consent, pre-op evaluation, timeout performed, IV checked, risks and benefits discussed and monitors and equipment checked Spinal Block Patient position: sitting Prep: site prepped and draped and DuraPrep Patient monitoring: heart rate, cardiac monitor, continuous pulse ox and blood pressure Approach: midline Location: L3-4 Injection technique: single-shot Needle Needle type: Spinocan and Tuohy  Needle gauge: 24 G Needle length: 9 cm Needle insertion depth: 6 cm Catheter type: closed end flexible Catheter at skin depth: 11 cm Assessment Sensory level: T4 Additional Notes Epidural performed using 17 Ga Touhy needle using LOR with air. Spinal performed through the epidural needle using 25 Ga Spinocan needle. CSF clear with free flow and no paresthesias. LA injected into the subarachnoid space and the spinal needle was withdrawn. A 24 Ga closed end multiport epidural catheter was then threaded into the epidural space. The epidural needle was then withdrawn and a tegoderm was placed over the epidural insertion site. The catheter was taped to the back and the patient was  Placed supine. Adequate sensory level was obtained.

## 2018-11-04 NOTE — Transfer of Care (Signed)
Immediate Anesthesia Transfer of Care Note  Patient: Jeffrey Ayala  Procedure(s) Performed: BILATERAL TOTAL KNEE ARTHROPLASTY (Bilateral Knee)  Patient Location: PACU  Anesthesia Type:MAC, Spinal and Epidural  Level of Consciousness: awake, alert  and oriented  Airway & Oxygen Therapy: Patient Spontanous Breathing  Post-op Assessment: Report given to RN and Post -op Vital signs reviewed and stable  Post vital signs: Reviewed and stable  Last Vitals:  Vitals Value Taken Time  BP 111/70 11/04/2018 10:39 AM  Temp    Pulse 74 11/04/2018 10:44 AM  Resp 14 11/04/2018 10:44 AM  SpO2 100 % 11/04/2018 10:44 AM  Vitals shown include unvalidated device data.  Last Pain:  Vitals:   11/04/18 0644  TempSrc:   PainSc: 0-No pain      Patients Stated Pain Goal: 7 (75/10/25 8527)  Complications: No apparent anesthesia complications

## 2018-11-05 MED ORDER — HYDROMORPHONE HCL 2 MG PO TABS
2.0000 mg | ORAL_TABLET | ORAL | Status: DC | PRN
  Administered 2018-11-05 – 2018-11-07 (×3): 2 mg via ORAL
  Filled 2018-11-05 (×3): qty 1

## 2018-11-05 MED ORDER — KETOROLAC TROMETHAMINE 30 MG/ML IJ SOLN
30.0000 mg | Freq: Four times a day (QID) | INTRAMUSCULAR | Status: DC | PRN
  Administered 2018-11-05 – 2018-11-06 (×3): 30 mg via INTRAVENOUS
  Filled 2018-11-05 (×4): qty 1

## 2018-11-05 MED ORDER — ACETAMINOPHEN 500 MG PO TABS
1000.0000 mg | ORAL_TABLET | Freq: Four times a day (QID) | ORAL | Status: DC
  Administered 2018-11-05 – 2018-11-07 (×7): 1000 mg via ORAL
  Filled 2018-11-05 (×7): qty 2

## 2018-11-05 NOTE — Plan of Care (Signed)
Acute rehab goals established. 

## 2018-11-05 NOTE — Progress Notes (Signed)
Pt voided 260ml in urinal sitting up on side of bed. Urine amber color. Pt denies any pain or discomfort; will continue to closely monitor. Reported off to charge RN. Delia Heady RN

## 2018-11-05 NOTE — Plan of Care (Signed)
  Problem: Education: Goal: Knowledge of the prescribed therapeutic regimen will improve Outcome: Progressing   Problem: Clinical Measurements: Goal: Postoperative complications will be avoided or minimized Outcome: Progressing   Problem: Skin Integrity: Goal: Will show signs of wound healing Outcome: Progressing   Problem: Clinical Measurements: Goal: Respiratory complications will improve Outcome: Progressing Goal: Cardiovascular complication will be avoided Outcome: Progressing   Problem: Pain Management: Goal: Pain level will decrease with appropriate interventions Outcome: Not Progressing   Problem: Activity: Goal: Risk for activity intolerance will decrease Outcome: Not Progressing   Problem: Coping: Goal: Level of anxiety will decrease Outcome: Not Progressing

## 2018-11-05 NOTE — Care Management Note (Signed)
Case Management Note  Patient Details  Name: Jeffrey Ayala MRN: [id number] Date of Birth: 1961-03-13  Subjective/Objective:  56 yr olsd gentleman s/p bilateral knee arthroplasties.                  Action/Plan: Case manager spoke with patient and wife concerning discharge plan and DME. Patient was preoperatively setup with [facility], no changes. CM will wait until patient has been evaluated by PT prior to ordering DME. Patient is in a great deal of pain this am. CM will continue to monitor.    Expected Discharge Date:  pending             Expected Discharge Plan:     In-House Referral:     Discharge planning Services  CM Consult  Post Acute Care Choice:  Durable Medical Equipment, Home Health Choice offered to:  Patient, Spouse  DME Arranged:  3-N-1, Walker rolling DME Agency:  [place]:  PT [place]:  [facility] (formerly [facility])  Status of Service:  In process, will continue to follow  If discussed at Long Length of Stay Meetings, dates discussed:    Additional Comments:  Ninfa Meeker, RN 11/05/2018, 9:50 AM

## 2018-11-05 NOTE — Progress Notes (Signed)
Physical Therapy Treatment Patient Details Name: Jeffrey Ayala MRN: [id number] DOB: 23-Sep-1961 Today's Date: 11/05/2018    History of Present Illness 57 y.o. male s/p bil TKA. Hx of HTN, renal stones, and dysthymic disorder.    PT Comments    Much improved since this morning. Pain better managed at this time. Ambulated up to 30 feet with RW, min assist. Still needs elevated surface to rise from bed with assistance. Good quad control. 90 degrees of knee flexion bil. Progressing well towards PT goals. Patient will continue to benefit from skilled physical therapy services to further improve independence with functional mobility.    Follow Up Recommendations  Follow surgeon's recommendation for DC plan and follow-up therapies     Equipment Recommendations  Rolling walker with 5" wheels;3in1 (PT)    Recommendations for Other Services OT consult     Precautions / Restrictions Precautions Precautions: Knee Precaution Booklet Issued: Yes (comment) Precaution Comments: reviewed Restrictions Weight Bearing Restrictions: Yes RLE Weight Bearing: Weight bearing as tolerated LLE Weight Bearing: Weight bearing as tolerated    Mobility  Bed Mobility Overal bed mobility: Needs Assistance Bed Mobility: Supine to Sit     Supine to sit: Min guard     General bed mobility comments: Min guard for safety, assist with IV and drainage lines. Performed well with some increased effort this afternoon but no physical assist for LEs.  Transfers Overall transfer level: Needs assistance Equipment used: Rolling walker (2 wheeled) Transfers: Sit to/from Stand Sit to Stand: From elevated surface;Min assist         General transfer comment: Min assist for boost to stand from slightly elevated bed surface. Pillows placed in chair for extra height. Cues for hand placement and technique.  Ambulation/Gait Ambulation/Gait assistance: Min assist Gait Distance (Feet): 30 Feet Assistive device:  Rolling walker (2 wheeled) Gait Pattern/deviations: Step-to pattern;Decreased stride length;Decreased weight shift to right;Decreased weight shift to left;Antalgic Gait velocity: slow   General Gait Details: Tolerated up to 30 feet in room today walking forward and backwards with instructions for sequencing, safety, and DME use. No buckling although using UEs for a moderate amount of support.   Stairs             Wheelchair Mobility    Modified Rankin (Stroke Patients Only)       Balance Overall balance assessment: Needs assistance Sitting-balance support: No upper extremity supported Sitting balance-Leahy Scale: Good     Standing balance support: Bilateral upper extremity supported Standing balance-Leahy Scale: Poor                              Cognition Arousal/Alertness: Awake/alert Behavior During Therapy: WFL for tasks assessed/performed Overall Cognitive Status: Within Functional Limits for tasks assessed                                        Exercises Total Joint Exercises Ankle Circles/Pumps: AROM;Both;10 reps;Supine Quad Sets: Strengthening;Both;10 reps;Supine Straight Leg Raises: Strengthening;Both;5 reps Long Arc Quad: AAROM;Both;5 reps;Seated Goniometric ROM: 6-90 deg Rt, 10-90 deg Lt    General Comments        Pertinent Vitals/Pain Pain Assessment: 0-10 Pain Score: 2  Pain Location: BIL knees Pain Descriptors / Indicators: Aching;Operative site guarding Pain Intervention(s): Monitored during session;Premedicated before session;Repositioned;Limited activity within patient's tolerance    Home Living  Prior Function            PT Goals (current goals can now be found in the care plan section) Acute Rehab PT Goals Patient Stated Goal: Stop hurting PT Goal Formulation: With patient/family Time For Goal Achievement: 11/19/18 Potential to Achieve Goals: Good Progress towards PT goals:  Progressing toward goals    Frequency    BID      PT Plan Current plan remains appropriate    Co-evaluation              AM-PAC PT "6 Clicks" Mobility   Outcome Measure  Help needed turning from your back to your side while in a flat bed without using bedrails?: None Help needed moving from lying on your back to sitting on the side of a flat bed without using bedrails?: None Help needed moving to and from a bed to a chair (including a wheelchair)?: A Little Help needed standing up from a chair using your arms (e.g., wheelchair or bedside chair)?: A Little Help needed to walk in hospital room?: A Little Help needed climbing 3-5 steps with a railing? : Total 6 Click Score: 18    End of Session Equipment Utilized During Treatment: Gait belt Activity Tolerance: Patient limited by pain Patient left: with call bell/phone within reach;in chair;with family/visitor present;with SCD's reapplied   PT Visit Diagnosis: Difficulty in walking, not elsewhere classified (R26.2);Pain Pain - Right/Left: (BIL) Pain - part of body: Knee     Time: 7948-0165 PT Time Calculation (min) (ACUTE ONLY): 21 min  Charges:  $Gait Training: 8-22 mins                     Elayne Snare, PT, DPT   Ellouise Newer 11/05/2018, 2:08 PM

## 2018-11-05 NOTE — Anesthesia Post-op Follow-up Note (Signed)
  Anesthesia Pain Follow-up Note  Patient: Jeffrey Ayala  Day #: 1  Date of Follow-up: 11/05/2018 Time: 1:00 PM  Last Vitals:  Vitals:   11/05/18 0103 11/05/18 0448  BP: 125/68 (!) 152/83  Pulse: 85 89  Resp: 18 16  Temp: 36.6 C 36.8 C  SpO2: 98% 97%    Level of Consciousness: alert  Pain: moderate   Side Effects:None  Catheter Site Exam:clean, dry, no drainage  Epidural / Intrathecal (From admission, onward)   Start     Dose/Rate Route Frequency Ordered Stop   11/04/18 1745  ropivacaine (PF) 2 mg/mL (0.2%) (NAROPIN) injection     10 mL/hr 10 mL/hr  Epidural Continuous 11/04/18 1732         Plan: Continue current therapy of postop epidural at surgeon's request   On initial evaluation the epidural infusion had been stopped due to low reservoir volume. Infusion restarted, bolus given, and new 200 ml infusion bottle connected.   Darean Rote P Jonerik Sliker

## 2018-11-05 NOTE — Evaluation (Signed)
Physical Therapy Evaluation Patient Details Name: Jeffrey Ayala MRN: [id number] DOB: September 24, 1961 Today's Date: 11/05/2018   History of Present Illness  57 y.o. male s/p bil TKA. Hx of HTN, renal stones, and dysthymic disorder.  Clinical Impression  Patient is s/p above surgery resulting in functional limitations due to the deficits listed below (see PT Problem List). Motivated, able to practice transfers today, sit<>stand with significant physical assistance. Tolerated some bedside pre-gait training, and LE exercises. Reviewed precautions. Patient will benefit from skilled PT to increase their independence and safety with mobility to allow discharge to the venue listed below.       Follow Up Recommendations Follow surgeon's recommendation for DC plan and follow-up therapies    Equipment Recommendations  Rolling walker with 5" wheels;3in1 (PT)    Recommendations for Other Services OT consult     Precautions / Restrictions Precautions Precautions: Knee Precaution Booklet Issued: Yes (comment) Restrictions Weight Bearing Restrictions: Yes RLE Weight Bearing: Weight bearing as tolerated LLE Weight Bearing: Weight bearing as tolerated      Mobility  Bed Mobility Overal bed mobility: Needs Assistance Bed Mobility: Supine to Sit;Sit to Supine     Supine to sit: Mod assist Sit to supine: Mod assist   General bed mobility comments: Mod assist for LE support, cues for technique and sequencing.  Transfers Overall transfer level: Needs assistance Equipment used: Rolling walker (2 wheeled) Transfers: Sit to/from Stand Sit to Stand: From elevated surface;Max assist         General transfer comment: Practiced sit<>stand from moderately elevated bed surface. Educated on technique, foot placement, hand placement, safety, and provided max assist for boost to stand. Once upright, does well with weight shift and self support with UEs.  Ambulation/Gait Ambulation/Gait assistance:  Mod assist Gait Distance (Feet): 1 Feet Assistive device: Rolling walker (2 wheeled) Gait Pattern/deviations: Step-to pattern;Decreased stride length;Decreased weight shift to right;Decreased weight shift to left;Antalgic Gait velocity: slow   General Gait Details: Pre-gait activities in standing progressing from weight shifts to standing marches, Mod assist for support intermittently and occasional knee block for Rt Knee. Good UE strength to support himself, able to stand independently with RW for several minutes at a time. Took 2 steps forward and backwards with support.  Stairs            Wheelchair Mobility    Modified Rankin (Stroke Patients Only)       Balance Overall balance assessment: Needs assistance Sitting-balance support: No upper extremity supported Sitting balance-Leahy Scale: Good     Standing balance support: Bilateral upper extremity supported Standing balance-Leahy Scale: Poor                               Pertinent Vitals/Pain Pain Assessment: 0-10 Pain Score: 8  Pain Location: BIL knees Pain Descriptors / Indicators: Aching;Operative site guarding Pain Intervention(s): Monitored during session;Repositioned    Home Living Family/patient expects to be discharged to:: Private residence Living Arrangements: Spouse/significant other Available Help at Discharge: Family;Available 24 hours/day Type of Home: House Home Access: Stairs to enter Entrance Stairs-Rails: Right;Left;Can reach both Entrance Stairs-Number of Steps: 5 Home Layout: Two level;Able to live on main level with bedroom/bathroom Home Equipment: Shower seat - built in      Prior Function Level of Independence: Independent               Hand Dominance        Extremity/Trunk Assessment  Upper Extremity Assessment Upper Extremity Assessment: Defer to OT evaluation    Lower Extremity Assessment Lower Extremity Assessment: LLE deficits/detail;RLE  deficits/detail RLE Deficits / Details: bandaged, unable to SLR RLE Sensation: WNL LLE Deficits / Details: Bandaged, unable to SLR LLE Sensation: WNL       Communication   Communication: No difficulties  Cognition Arousal/Alertness: Awake/alert Behavior During Therapy: WFL for tasks assessed/performed Overall Cognitive Status: Within Functional Limits for tasks assessed                                        General Comments General comments (skin integrity, edema, etc.): Educated on positioning following post-op TKA    Exercises Total Joint Exercises Ankle Circles/Pumps: AROM;Both;10 reps;Supine Quad Sets: Strengthening;Both;10 reps;Supine Long Arc Quad: AAROM;Both;5 reps;Seated   Assessment/Plan    PT Assessment Patient needs continued PT services  PT Problem List Decreased strength;Decreased range of motion;Decreased activity tolerance;Decreased balance;Decreased mobility;Decreased knowledge of use of DME;Decreased knowledge of precautions;Pain       PT Treatment Interventions DME instruction;Gait training;Stair training;Functional mobility training;Therapeutic activities;Therapeutic exercise;Balance training;Neuromuscular re-education;Patient/family education    PT Goals (Current goals can be found in the Care Plan section)  Acute Rehab PT Goals Patient Stated Goal: Stop hurting PT Goal Formulation: With patient/family Time For Goal Achievement: 11/19/18 Potential to Achieve Goals: Good    Frequency BID   Barriers to discharge Inaccessible home environment Stairs    Co-evaluation               AM-PAC PT "6 Clicks" Mobility  Outcome Measure Help needed turning from your back to your side while in a flat bed without using bedrails?: A Lot Help needed moving from lying on your back to sitting on the side of a flat bed without using bedrails?: A Lot Help needed moving to and from a bed to a chair (including a wheelchair)?: A Lot Help needed  standing up from a chair using your arms (e.g., wheelchair or bedside chair)?: A Lot Help needed to walk in hospital room?: A Lot Help needed climbing 3-5 steps with a railing? : Total 6 Click Score: 11    End of Session Equipment Utilized During Treatment: Gait belt Activity Tolerance: Patient limited by pain Patient left: in bed;with call bell/phone within reach;with family/visitor present Nurse Communication: Other (comment)(Attempted report.) PT Visit Diagnosis: Difficulty in walking, not elsewhere classified (R26.2);Pain Pain - Right/Left: (BIL) Pain - part of body: Knee    Time: 7001-7494 PT Time Calculation (min) (ACUTE ONLY): 34 min   Charges:   PT Evaluation $PT Eval Low Complexity: 1 Low PT Treatments $Therapeutic Activity: 8-22 mins        Elayne Snare, PT, DPT  Ellouise Newer 11/05/2018, 9:52 AM

## 2018-11-05 NOTE — Addendum Note (Signed)
Addendum  created 11/05/18 1304 by Murvin Natal, MD   Clinical Note Signed

## 2018-11-05 NOTE — Progress Notes (Signed)
Subjective: 1 Day Post-Op Procedure(s) (LRB): BILATERAL TOTAL KNEE ARTHROPLASTY (Bilateral) Patient reports pain as moderate and severe.    Objective: Vital signs in last 24 hours: Temp:  [97 F (36.1 C)-99.5 F (37.5 C)] 98.3 F (36.8 C) (12/14 0448) Pulse Rate:  [63-93] 89 (12/14 0448) Resp:  [8-24] 16 (12/14 0448) BP: (73-152)/(43-90) 152/83 (12/14 0448) SpO2:  [92 %-100 %] 97 % (12/14 0448)  Intake/Output from previous day: 12/13 0701 - 12/14 0700 In: 2371.3 [P.O.:220; I.V.:1585.2; IV Piggyback:100] Out: 2162 [Urine:1450; Blood:300] Intake/Output this shift: No intake/output data recorded.  No results for input(s): HGB in the last 72 hours. No results for input(s): WBC, RBC, HCT, PLT in the last 72 hours. No results for input(s): NA, K, CL, CO2, BUN, CREATININE, GLUCOSE, CALCIUM in the last 72 hours. No results for input(s): LABPT, INR in the last 72 hours.  Neurologically intact Neurovascular intact Sensation intact distally Intact pulses distally Dorsiflexion/Plantar flexion intact Compartment soft  Anticipated LOS equal to or greater than 2 midnights due to - Age 1 and older with one or more of the following:  - Obesity  - Expected need for hospital services (PT, OT, Nursing) required for safe  discharge  - Anticipated need for postoperative skilled nursing care or inpatient rehab  - Active co-morbidities: None OR   - Unanticipated findings during/Post Surgery: Slow post-op progression: GI, pain control, mobility  - Patient is a high risk of re-admission due to: None   Assessment/Plan: 1 Day Post-Op Procedure(s) (LRB): BILATERAL TOTAL KNEE ARTHROPLASTY (Bilateral) Advance diet Up with therapy Discharge home with home health Discharge to SNF  Will D/C oxycodone and start dilaudid Ofirmev q6h    Biagio Borg, PA-C 11/05/2018, 10:17 AM

## 2018-11-05 NOTE — Progress Notes (Signed)
Pt foley removed per order and protocol at 0830. UA in catheter bag noted to be amber with sediments. Perineal care done upon removal. Pt wanted to keep foley until PT see him but educated on risks and advised to use urinal when he needs to void. 127ml emptied from drainage bag. Will continue to closely monitor. Pt due to void. Delia Heady RN

## 2018-11-05 NOTE — Progress Notes (Signed)
Patient has been in a lot of pain most of shift. Called on call MD regarding uncontrolled pain with current pain meds. On call MD added Toradol 30 mg q6h prn for pt. Pt stated that he was still in pain after receiving Toradol also. Pt educated on side effects of receiving too much pain medication. Ice has been applied to his knees bilaterally. Pt states nothing is helping. Will continue to monitor pt and give pain medicine as needed when it is safe to do so.

## 2018-11-06 ENCOUNTER — Encounter (HOSPITAL_COMMUNITY): Payer: Self-pay | Admitting: Cardiology

## 2018-11-06 ENCOUNTER — Inpatient Hospital Stay (HOSPITAL_COMMUNITY): Payer: BLUE CROSS/BLUE SHIELD | Admitting: Anesthesiology

## 2018-11-06 DIAGNOSIS — Z6834 Body mass index (BMI) 34.0-34.9, adult: Secondary | ICD-10-CM | POA: Diagnosis not present

## 2018-11-06 DIAGNOSIS — Z79899 Other long term (current) drug therapy: Secondary | ICD-10-CM | POA: Diagnosis not present

## 2018-11-06 DIAGNOSIS — Z825 Family history of asthma and other chronic lower respiratory diseases: Secondary | ICD-10-CM | POA: Diagnosis not present

## 2018-11-06 DIAGNOSIS — F341 Dysthymic disorder: Secondary | ICD-10-CM | POA: Diagnosis present

## 2018-11-06 DIAGNOSIS — Z888 Allergy status to other drugs, medicaments and biological substances status: Secondary | ICD-10-CM | POA: Diagnosis not present

## 2018-11-06 DIAGNOSIS — F329 Major depressive disorder, single episode, unspecified: Secondary | ICD-10-CM | POA: Diagnosis present

## 2018-11-06 DIAGNOSIS — R030 Elevated blood-pressure reading, without diagnosis of hypertension: Secondary | ICD-10-CM

## 2018-11-06 DIAGNOSIS — R000 Tachycardia, unspecified: Secondary | ICD-10-CM | POA: Diagnosis present

## 2018-11-06 DIAGNOSIS — Z96653 Presence of artificial knee joint, bilateral: Secondary | ICD-10-CM | POA: Diagnosis not present

## 2018-11-06 DIAGNOSIS — F419 Anxiety disorder, unspecified: Secondary | ICD-10-CM | POA: Diagnosis present

## 2018-11-06 DIAGNOSIS — I10 Essential (primary) hypertension: Secondary | ICD-10-CM | POA: Diagnosis present

## 2018-11-06 DIAGNOSIS — R42 Dizziness and giddiness: Secondary | ICD-10-CM

## 2018-11-06 DIAGNOSIS — E669 Obesity, unspecified: Secondary | ICD-10-CM | POA: Diagnosis present

## 2018-11-06 DIAGNOSIS — I493 Ventricular premature depolarization: Secondary | ICD-10-CM

## 2018-11-06 DIAGNOSIS — Z87442 Personal history of urinary calculi: Secondary | ICD-10-CM | POA: Diagnosis not present

## 2018-11-06 DIAGNOSIS — Z885 Allergy status to narcotic agent status: Secondary | ICD-10-CM | POA: Diagnosis not present

## 2018-11-06 DIAGNOSIS — I491 Atrial premature depolarization: Secondary | ICD-10-CM

## 2018-11-06 DIAGNOSIS — M170 Bilateral primary osteoarthritis of knee: Secondary | ICD-10-CM | POA: Diagnosis present

## 2018-11-06 DIAGNOSIS — Z8619 Personal history of other infectious and parasitic diseases: Secondary | ICD-10-CM | POA: Diagnosis not present

## 2018-11-06 LAB — CBC WITH DIFFERENTIAL/PLATELET
Abs Immature Granulocytes: 0.06 10*3/uL (ref 0.00–0.07)
BASOS PCT: 0 %
Basophils Absolute: 0 10*3/uL (ref 0.0–0.1)
Eosinophils Absolute: 0 10*3/uL (ref 0.0–0.5)
Eosinophils Relative: 0 %
HCT: 31.8 % — ABNORMAL LOW (ref 39.0–52.0)
Hemoglobin: 10 g/dL — ABNORMAL LOW (ref 13.0–17.0)
Immature Granulocytes: 1 %
Lymphocytes Relative: 6 %
Lymphs Abs: 0.7 10*3/uL (ref 0.7–4.0)
MCH: 28.5 pg (ref 26.0–34.0)
MCHC: 31.4 g/dL (ref 30.0–36.0)
MCV: 90.6 fL (ref 80.0–100.0)
Monocytes Absolute: 0.9 10*3/uL (ref 0.1–1.0)
Monocytes Relative: 8 %
Neutro Abs: 9.3 10*3/uL — ABNORMAL HIGH (ref 1.7–7.7)
Neutrophils Relative %: 85 %
Platelets: 254 10*3/uL (ref 150–400)
RBC: 3.51 MIL/uL — AB (ref 4.22–5.81)
RDW: 14.2 % (ref 11.5–15.5)
WBC: 10.9 10*3/uL — ABNORMAL HIGH (ref 4.0–10.5)
nRBC: 0 % (ref 0.0–0.2)

## 2018-11-06 LAB — BASIC METABOLIC PANEL
Anion gap: 11 (ref 5–15)
BUN: 10 mg/dL (ref 6–20)
CALCIUM: 8.7 mg/dL — AB (ref 8.9–10.3)
CO2: 27 mmol/L (ref 22–32)
CREATININE: 1.01 mg/dL (ref 0.61–1.24)
Chloride: 99 mmol/L (ref 98–111)
GFR calc Af Amer: >60 mL/min (ref >60)
GFR calc non Af Amer: >60 mL/min (ref >60)
Glucose, Bld: 163 mg/dL — ABNORMAL HIGH (ref 70–99)
Potassium: 3.2 mmol/L — ABNORMAL LOW (ref 3.5–5.1)
SODIUM: 137 mmol/L (ref 135–145)

## 2018-11-06 LAB — MAGNESIUM: Magnesium: 1.8 mg/dL (ref 1.7–2.4)

## 2018-11-06 NOTE — Anesthesia Post-op Follow-up Note (Signed)
  Anesthesia Pain Follow-up Note  Patient: Jeffrey Ayala  Day #: 2  Date of Follow-up: 11/06/2018 Time: 4:48 PM  Last Vitals:  Vitals:   11/06/18 0317 11/06/18 1500  BP: (!) 145/73 (!) 152/82  Pulse: 98 97  Resp: 18   Temp: 37.2 C 37.7 C  SpO2: 92% 91%    Level of Consciousness: alert  Pain: mild, moderate   Side Effects:Nausea and Vomiting  Catheter Site Exam:clean, dry, no drainage  Epidural / Intrathecal (From admission, onward)   Start     Dose/Rate Route Frequency Ordered Stop   11/04/18 1745  ropivacaine (PF) 2 mg/mL (0.2%) (NAROPIN) injection     10 mL/hr 10 mL/hr  Epidural Continuous 11/04/18 1732         Plan: Catheter removed/tip intact at surgeon's request and D/C Infusion at surgeon's request  Discussed with rounding surgeon. Epidural catheter discontinued and removed. Verified tip intact.  All patient questions answered.  Catalina Gravel

## 2018-11-06 NOTE — Consult Note (Signed)
Cardiology Consultation:   Patient ID: Jeffrey Ayala; [id number]; 01-Oct-1961   Admit date: 11/04/2018 Date of Consult: 11/06/2018  Primary Care Provider: Patient, No Pcp Per Primary Cardiologist: Dr. Candee Furbish (recent pre-op consult)    Patient Profile:   Jeffrey Ayala is a 57 y.o. male with no known cardiac history and now postop day 2 status post bilateral total knee arthroplasty who is being seen today for the evaluation of lightheadedness and ectopy at the request of [name].  History of Present Illness:   Jeffrey Ayala is now postop day 2 status post bilateral total knee arthroplasty.  He was seen for preoperative cardiac clearance by Dr. Marlou Porch on December 11 with no prior documented cardiac history, description of activity exceeding 4 METS without symptoms, and overall felt to be low risk.  He did have elevated blood pressure without diagnosis of hypertension with recommendation to establish with PCP.  Patient was walking in the hall with PT earlier this morning when he began to experience lightheadedness and nausea, no chest pain.  ECG shows sinus rhythm with frequent PACs and PVCs.  He is currently not on telemetry.  He has no history of cardiac arrhythmia.  States that this is the first time he had gotten out of bed and walked for this duration.  He has had intermittent nausea as well and remains on analgesic medications.  He repeats no major functional limitations related to shortness of breath, lack of stamina, or chest pain.  He has no prior history of syncope.   Past Medical History:  Diagnosis Date  . Concussion   . History of kidney stones   . Migraine     Past Surgical History:  Procedure Laterality Date  . CYSTOSCOPY    . DENTAL SURGERY    . FINGER SURGERY    . HERNIA REPAIR     at age 74  . KNEE ARTHROSCOPY Left   . LITHOTRIPSY    . NEPHROLITHOTOMY    . TONSILLECTOMY       Inpatient Medications: Scheduled Meds: . acetaminophen  1,000 mg Oral Q6H    . aspirin EC  325 mg Oral Q breakfast  . docusate sodium  100 mg Oral BID   Continuous Infusions: . sodium chloride 10 mL/hr at 11/06/18 0007  . methocarbamol (ROBAXIN) IV    . ropivacaine (PF) 2 mg/mL (0.2%) 10 mL/hr (11/06/18 0453)   PRN Meds: bisacodyl, HYDROmorphone (DILAUDID) injection, HYDROmorphone, ketorolac, magnesium citrate, menthol-cetylpyridinium **OR** phenol, methocarbamol **OR** methocarbamol (ROBAXIN) IV, metoCLOPramide **OR** metoCLOPramide (REGLAN) injection, ondansetron **OR** ondansetron (ZOFRAN) IV, polyethylene glycol  Allergies:    Allergies  Allergen Reactions  . Codeine Hives    Insomnia, jittery  . Flomax [Tamsulosin Hcl] Nausea Only    Nausea and fatigue    Social History:   Social History   Socioeconomic History  . Marital status: Married    Spouse name: Not on file  . Number of children: Not on file  . Years of education: Not on file  . Highest education level: Not on file  Occupational History  . Not on file  Social Needs  . Financial resource strain: Not on file  . Food insecurity:    Worry: Not on file    Inability: Not on file  . Transportation needs:    Medical: Not on file    Non-medical: Not on file  Tobacco Use  . Smoking status: Never Smoker  . Smokeless tobacco: Never Used  Substance and Sexual Activity  .  Alcohol use: Yes    Comment: occassional  . Drug use: No  . Sexual activity: Not on file  Lifestyle  . Physical activity:    Days per week: Not on file    Minutes per session: Not on file  . Stress: Not on file  Relationships  . Social connections:    Talks on phone: Not on file    Gets together: Not on file    Attends religious service: Not on file    Active member of club or organization: Not on file    Attends meetings of clubs or organizations: Not on file    Relationship status: Not on file  . Intimate partner violence:    Fear of current or ex partner: Not on file    Emotionally abused: Not on file     Physically abused: Not on file    Forced sexual activity: Not on file  Other Topics Concern  . Not on file  Social History Narrative  . Not on file    Family History:   The patient's family history includes COPD in his mother.  ROS:  Please see the history of present illness.  All other ROS reviewed and negative.  Physical Exam/Data:   Vitals:   11/05/18 0448 11/05/18 1349 11/05/18 1946 11/06/18 0317  BP: (!) 152/83 131/74 (!) 141/66 (!) 145/73  Pulse: 89 98 100 98  Resp: 16  18 18   Temp: 98.3 F (36.8 C) 98.1 F (36.7 C) 98.8 F (37.1 C) 98.9 F (37.2 C)  TempSrc: Rectal Oral Oral Oral  SpO2: 97% 98% 95% 92%  Height:        Intake/Output Summary (Last 24 hours) at 11/06/2018 1142 Last data filed at 11/06/2018 0500 Gross per 24 hour  Intake 126.27 ml  Output 400 ml  Net -273.73 ml   There were no vitals filed for this visit. Body mass index is 34.09 kg/m.   Gen: Patient appears comfortable at rest. HEENT: Conjunctiva and lids normal, oropharynx clear. Neck: Supple, no elevated JVP or carotid bruits, no thyromegaly. Lungs: Clear to auscultation, nonlabored breathing at rest. Cardiac: Regular rate and rhythm with occasional ectopy, no S3 or significant systolic murmur, no pericardial rub. Abdomen: Soft, nontender, bowel sounds present. Extremities: Bilateral dressings intact. Skin: Warm and dry. Musculoskeletal: No kyphosis. Neuropsychiatric: Alert and oriented x3, affect grossly appropriate.  EKG:  I personally reviewed the tracing from 11/06/2018 which shows sinus tachycardia with frequent PACs and PVCs, nonspecific ST changes.  Laboratory Data:  Chemistry Recent Labs  Lab 11/06/18 1009  NA 137  K 3.2*  CL 99  CO2 27  GLUCOSE 163*  BUN 10  CREATININE 1.01  CALCIUM 8.7*  GFRNONAA >60  GFRAA >60  ANIONGAP 11    No results for input(s): PROT, ALBUMIN, AST, ALT, ALKPHOS, BILITOT in the last 168 hours. Hematology Recent Labs  Lab 11/06/18 1009    WBC 10.9*  RBC 3.51*  HGB 10.0*  HCT 31.8*  MCV 90.6  MCH 28.5  MCHC 31.4  RDW 14.2  PLT 254   Cardiac Enzymes Recent Labs  Lab 11/04/18 1228  TROPONINI <0.03   No results for input(s): TROPIPOC in the last 168 hours.   Radiology/Studies:  No results found.  Assessment and Plan:   1.  Episode of lightheadedness and nausea that occurred while ambulating with PT, possibly vasovagal in etiology based on description, patient not on cardiac monitor.  Follow-up ECG showed sinus tachycardia with frequent PACs and PVCs.  He has no history of cardiac arrhythmia.  No functional limitations or symptoms to suggest ischemic heart disease at baseline with recent low risk assessment and preoperative cardiac consultation.  2.  Elevated blood pressure without standing diagnosis of hypertension.  3.  Post-operative day 2 status post bilateral total knee arthroplasty.  PT has recommended at least another day of observation.  He has no active symptoms at this time, no palpitations or chest pain.  Would place him on telemetry to exclude any significant arrhythmias but otherwise no further cardiac testing at this point.  He may ultimately need an antihypertensive for his blood pressure, but would not start as yet.  Signed, Rozann Lesches, MD  11/06/2018 11:42 AM

## 2018-11-06 NOTE — Progress Notes (Signed)
Physical Therapy Treatment Patient Details Name: Jeffrey Ayala MRN: [id number] DOB: [birth year] Today's Date: 11/06/2018    History of Present Illness 57 y.o. male s/p bil TKA. Hx of HTN, renal stones, and dysthymic disorder.    PT Comments    Continuing work on functional mobility and activity tolerance; Limited session this afternoon, as [name] was feeling horrible and experiencing quite a lot of pain this afternoon; Exercised both knees to his tolerance   Follow Up Recommendations  Follow surgeon's recommendation for DC plan and follow-up therapies     Equipment Recommendations  Rolling walker with 5" wheels;3in1 (PT)    Recommendations for Other Services OT consult(ordered per protocol)     Precautions / Restrictions Precautions Precautions: Knee Precaution Comments: Pt educated to not allow any pillow or bolster under knee for healing with optimal range of motion.  Restrictions RLE Weight Bearing: Weight bearing as tolerated LLE Weight Bearing: Weight bearing as tolerated    Mobility  Bed Mobility                  Transfers                    Ambulation/Gait                 Stairs             Wheelchair Mobility    Modified Rankin (Stroke Patients Only)       Balance                                            Cognition Arousal/Alertness: Awake/alert Behavior During Therapy: WFL for tasks assessed/performed Overall Cognitive Status: Within Functional Limits for tasks assessed                                 General Comments: Sleepy and feeling lousy this afternoon      Exercises Total Joint Exercises Ankle Circles/Pumps: AROM;Both;10 reps;Supine Quad Sets: Strengthening;Both;15 reps;Supine Short Arc Quad: AAROM;Right;Left;10 reps;Supine(alternating) Heel Slides: AAROM;Right;Left;10 reps;Supine(alternating) Straight Leg Raises: Strengthening;Both;5 reps    General Comments         Pertinent Vitals/Pain Pain Assessment: 0-10 Pain Score: 9  Pain Location: BIL knees; as well as nausea Pain Descriptors / Indicators: Aching;Operative site guarding Pain Intervention(s): Monitored during session    Home Living                      Prior Function            PT Goals (current goals can now be found in the care plan section) Acute Rehab PT Goals Patient Stated Goal: Christmas with his 57 year old PT Goal Formulation: With patient/family Time For Goal Achievement: 11/19/18 Potential to Achieve Goals: Good Progress towards PT goals: Not progressing toward goals - comment(limited by pain, nausea, malaise this pm )    Frequency    7X/week      PT Plan Current plan remains appropriate    Co-evaluation              AM-PAC PT "6 Clicks" Mobility   Outcome Measure  Help needed turning from your back to your side while in a flat bed without using bedrails?: A Little Help needed moving from lying on your back to sitting  on the side of a flat bed without using bedrails?: A Lot Help needed moving to and from a bed to a chair (including a wheelchair)?: A Lot Help needed standing up from a chair using your arms (e.g., wheelchair or bedside chair)?: A Lot Help needed to walk in hospital room?: A Lot Help needed climbing 3-5 steps with a railing? : Total 6 Click Score: 12    End of Session   Activity Tolerance: Patient limited by fatigue;Patient limited by pain Patient left: in bed;with call bell/phone within reach;with nursing/sitter in room   PT Visit Diagnosis: Difficulty in walking, not elsewhere classified (R26.2);Pain Pain - Right/Left: (bil) Pain - part of body: Knee     Time: 1520-1540 PT Time Calculation (min) (ACUTE ONLY): 20 min  Charges:  $Therapeutic Exercise: 8-22 mins                     Roney Marion, PT  Acute Rehabilitation Services Pager [phone] Office [place] 11/06/2018, 5:16 PM

## 2018-11-06 NOTE — Progress Notes (Signed)
Patient ID: Jeffrey Ayala, male   DOB: [birth year], 57 y.o.   MRN: [id number] to   Spoke to Cardiology about PAC/PVC with vasovagal episode with PT.  EKG sinus with ectopics.  Cardiology will see him.  Requested CBC, BMET, and Magnesium ordered per their request  Mike Craze. [place], [place] [phone]  11/06/2018 9:52 AM

## 2018-11-06 NOTE — Addendum Note (Signed)
Addendum  created 11/06/18 1650 by Catalina Gravel, MD   [facility] properties accepted

## 2018-11-06 NOTE — Progress Notes (Addendum)
PATIENT ID: Jeffrey Ayala        MRN:  [id number]          DOB/AGE: Jul 11, 1961 / 57 y.o.    Joni Fears, MD   Biagio Borg, PA-C [address] [place], [place]  [address]                             [phone]   PROGRESS NOTE  Subjective:  negative for Chest Pain  negative for Shortness of Breath  negative for Nausea/Vomiting   negative for Calf Pain    Tolerating Diet: yes         Patient reports pain as moderate.     Nauseated and lightheadedness with PT  Objective: Vital signs in last 24 hours:    Patient Vitals for the past 24 hrs:  BP Temp Temp src Pulse Resp SpO2  11/06/18 0317 (!) 145/73 98.9 F (37.2 C) Oral 98 18 92 %  11/05/18 1946 (!) 141/66 98.8 F (37.1 C) Oral 100 18 95 %  11/05/18 1349 131/74 98.1 F (36.7 C) Oral 98 - 98 %      Intake/Output from previous day:   12/14 0701 - 12/15 0700 In: 200.3 [I.V.:200.3] Out: 400 [Urine:400]   Intake/Output this shift:   No intake/output data recorded.   Intake/Output      12/14 0701 - 12/15 0700 12/15 0701 - 12/16 0700   P.O.     I.V. 200.3    Other     IV Piggyback     Total Intake 200.3    Urine 400    Drains     Blood     Total Output 400    Net -199.8            LABORATORY DATA: No results for input(s): WBC, HGB, HCT, PLT in the last 168 hours. No results for input(s): NA, K, CL, CO2, BUN, CREATININE, GLUCOSE, CALCIUM in the last 168 hours. No results found for: INR, PROTIME  Recent Radiographic Studies :  No results found.   Examination:  General appearance: alert, cooperative and moderate distress Resp: clear to auscultation bilaterally Cardio: regular rate and rhythm with occassional ectopics GI: abnormal findings:  Mildly distended and hypoactive bowel sounds Extremities: dressings intact.  No calf pain  Wound Exam: clean, dry, intact dressing  Motor Exam: EHL, FHL, Anterior Tibial and Posterior Tibial Intact  Sensory Exam: Superficial Peroneal and Deep Peroneal  normal  Vascular Exam: Left posterior tibial artery has 1+ (weak) pulse and Right dorsalis pedis artery has 1+ (weak) pulse  Assessment:    2 Days Post-Op  Procedure(s) (LRB): BILATERAL TOTAL KNEE ARTHROPLASTY (Bilateral)  Vaso-Vagal with PT ambulation Cardiac ectopics Mild ileus  ADDITIONAL DIAGNOSIS:  Active Problems:   Bilateral primary osteoarthritis of knee   S/P TKR (total knee replacement), bilateral   Plan: Physical Therapy as ordered Weight Bearing as Tolerated (WBAT)  DVT Prophylaxis:  Aspirin  DISCHARGE PLAN: Home  DISCHARGE NEEDS: HHPT, Walker and 3-in-1 comode seat  STAT EKG ordered for ectopics         Biagio Borg, PA-C [place]  11/06/2018 9:13 AM  Patient ID: Jeffrey Ayala, male   DOB: 1961-06-26, 57 y.o.   MRN: [id number]

## 2018-11-06 NOTE — Progress Notes (Signed)
Physical Therapy Treatment Patient Details Name: Jeffrey Ayala MRN: [id number] DOB: 1961/04/05 Today's Date: 11/06/2018    History of Present Illness 57 y.o. male s/p bil TKA. Hx of HTN, renal stones, and dysthymic disorder.    PT Comments    Continuing work on functional mobility and activity tolerance;  Overall good knee control in standing and in stance; no overt buckling; Session limited by nausea and lightheadedness with walking in the hallway -- discussed with Jeffrey Ayala, [place], likely vasovagal; Jeffrey Ayala very much wants to get home (isn't sleeping well here); Still, I anticipate needing another day inpatient to maximize independence and safety with mobility.  Follow Up Recommendations  Follow surgeon's recommendation for DC plan and follow-up therapies     Equipment Recommendations  Rolling walker with 5" wheels;3in1 (PT)    Recommendations for Other Services OT consult(ordered per protocol)     Precautions / Restrictions Precautions Precautions: Knee Precaution Booklet Issued: Yes (comment) Precaution Comments: Pt educated to not allow any pillow or bolster under knee for healing with optimal range of motion.  Restrictions RLE Weight Bearing: Weight bearing as tolerated LLE Weight Bearing: Weight bearing as tolerated    Mobility  Bed Mobility                  Transfers Overall transfer level: Needs assistance Equipment used: Rolling walker (2 wheeled) Transfers: Sit to/from Stand Sit to Stand: Mod assist;From elevated surface(to approximate home)         General transfer comment: verbal and demo cues for technqiue; Mod assist to stabilize as he moved hands from bed to RW; Good control of stand to sit with cues  Ambulation/Gait Ambulation/Gait assistance: Min assist(wife pushed chair behind) Gait Distance (Feet): 50 Feet Assistive device: Rolling walker (2 wheeled) Gait Pattern/deviations: Step-to pattern;Decreased stride length;Decreased weight shift to  right;Decreased weight shift to left;Antalgic Gait velocity: slow   General Gait Details: Able to walk outside of room in hallway; cues to stand tall and activate quads for knee stability in stance, and activate gluteals for core stability and posture; No buckling although using UEs for a moderate amount of support.   Stairs             Wheelchair Mobility    Modified Rankin (Stroke Patients Only)       Balance     Sitting balance-Leahy Scale: Good     Standing balance support: Bilateral upper extremity supported Standing balance-Leahy Scale: Poor                              Cognition Arousal/Alertness: Awake/alert Behavior During Therapy: WFL for tasks assessed/performed Overall Cognitive Status: Within Functional Limits for tasks assessed                                        Exercises Total Joint Exercises Ankle Circles/Pumps: AROM;Both;10 reps;Supine Quad Sets: Strengthening;Both;15 reps;Supine Long Arc Quad: AAROM;Both;5 reps;Seated Goniometric ROM: Approx 4-90 Right, 6-80 Left; Passivley    General Comments        Pertinent Vitals/Pain Pain Assessment: Faces Faces Pain Scale: Hurts even more Pain Location: BIL knees; as well as nausea Pain Descriptors / Indicators: Aching;Operative site guarding Pain Intervention(s): Monitored during session    Home Living  Prior Function            PT Goals (current goals can now be found in the care plan section) Acute Rehab PT Goals Patient Stated Goal: Christmas with his 57 year old PT Goal Formulation: With patient/family Time For Goal Achievement: 11/19/18 Potential to Achieve Goals: Good Progress towards PT goals: Progressing toward goals    Frequency    7X/week      PT Plan Current plan remains appropriate;Frequency needs to be updated(per dept protocol)    Co-evaluation              AM-PAC PT "6 Clicks" Mobility   Outcome  Measure  Help needed turning from your back to your side while in a flat bed without using bedrails?: None Help needed moving from lying on your back to sitting on the side of a flat bed without using bedrails?: A Little Help needed moving to and from a bed to a chair (including a wheelchair)?: A Little Help needed standing up from a chair using your arms (e.g., wheelchair or bedside chair)?: A Lot Help needed to walk in hospital room?: A Little Help needed climbing 3-5 steps with a railing? : Total 6 Click Score: 16    End of Session Equipment Utilized During Treatment: Gait belt Activity Tolerance: Patient tolerated treatment well;Other (comment)(limited by nausea) Patient left: in chair;with call bell/phone within reach Nurse Communication: Other (comment)(Quite nauseated) PT Visit Diagnosis: Difficulty in walking, not elsewhere classified (R26.2);Pain Pain - Right/Left: (BIL) Pain - part of body: Knee     Time: 0813-0859 PT Time Calculation (min) (ACUTE ONLY): 46 min  Charges:  $Gait Training: 8-22 mins $Therapeutic Exercise: 8-22 mins $Therapeutic Activity: 8-22 mins                     Roney Marion, PT  Acute Rehabilitation Services Pager [phone] Office [place] 11/06/2018, 9:13 AM

## 2018-11-06 NOTE — Plan of Care (Signed)
  Problem: Education: Goal: Knowledge of the prescribed therapeutic regimen will improve Outcome: Progressing Goal: Individualized Educational Video(s) Outcome: Progressing   Problem: Clinical Measurements: Goal: Postoperative complications will be avoided or minimized Outcome: Progressing   Problem: Pain Management: Goal: Pain level will decrease with appropriate interventions Outcome: Progressing

## 2018-11-07 ENCOUNTER — Encounter (HOSPITAL_COMMUNITY): Payer: Self-pay | Admitting: Orthopedic Surgery

## 2018-11-07 ENCOUNTER — Other Ambulatory Visit: Payer: Self-pay

## 2018-11-07 DIAGNOSIS — Z96653 Presence of artificial knee joint, bilateral: Secondary | ICD-10-CM

## 2018-11-07 NOTE — Evaluation (Signed)
Occupational Therapy Evaluation Patient Details Name: Jeffrey Ayala MRN: [id number] DOB: 15-Jan-1961 Today's Date: 11/07/2018    History of Present Illness 57 y.o. male s/p bil TKA. Hx of HTN, renal stones, and dysthymic disorder.   Clinical Impression   Pt able to demonstrate transfers at min guard assist level from elevated surfaces and chairs with arm rests. Spent significant time with patient educating on tub transfers and DME associated with it. Pt and wife ultimately decided that tub bench was best, safest option that would work and OT in agreement. Demo-ed AE kit for LB as well. Pt able to dress and bathe with the assistance of his wife/caregiver. Pt and wife with no other questions or concerns and OT to sign off at this time. Thank you for the opportunity to serve this patient and his family.     Follow Up Recommendations  Follow surgeon's recommendation for DC plan and follow-up therapies;Supervision/Assistance - 24 hour    Equipment Recommendations  3 in 1 bedside commode;Tub/shower bench    Recommendations for Other Services       Precautions / Restrictions Precautions Precautions: Knee Precaution Booklet Issued: Yes (comment) Precaution Comments: Restrictions Weight Bearing Restrictions: Yes RLE Weight Bearing: Weight bearing as tolerated LLE Weight Bearing: Weight bearing as tolerated      Mobility Bed Mobility               General bed mobility comments: OOB at the beginning and end of session  Transfers Overall transfer level: Needs assistance Equipment used: Rolling walker (2 wheeled) Transfers: Sit to/from Stand Sit to Stand: Min guard;Min assist         General transfer comment: min guard assist for safety from elevated surfaces and chairs with arm rests    Balance Overall balance assessment: Needs assistance Sitting-balance support: No upper extremity supported Sitting balance-Leahy Scale: Good     Standing balance support: Single  extremity supported;No upper extremity supported;During functional activity Standing balance-Leahy Scale: Fair Standing balance comment: able to sustain brief static standing without BUE support                           ADL either performed or assessed with clinical judgement   ADL Overall ADL's : Needs assistance/impaired Eating/Feeding: Independent   Grooming: Min guard;Standing Grooming Details (indicate cue type and reason): able to static stand at sink without UE support Upper Body Bathing: Set up   Lower Body Bathing: Minimal assistance;With caregiver independent assisting;Sitting/lateral leans   Upper Body Dressing : Independent   Lower Body Dressing: Moderate assistance;With caregiver independent assisting;Sit to/from stand Lower Body Dressing Details (indicate cue type and reason): with therading his feet through underwear and shorts, Pt able to bring clothing up from knees Toilet Transfer: Min guard;Ambulation;RW Toilet Transfer Details (indicate cue type and reason): min guard for safety Toileting- Clothing Manipulation and Hygiene: Min guard   Tub/ Shower Transfer: Min guard;With caregiver independent assisting;Tub bench   Functional mobility during ADLs: Min guard;Rolling walker General ADL Comments: wife is willing and able to assist with LB ADL - also educated/demo of AE kit for LB ADL - went through tub options in detail in the ortho gym     Vision         Perception     Praxis      Pertinent Vitals/Pain Pain Assessment: Faces Pain Score: 3 (zero R knee) Faces Pain Scale: Hurts even more Pain Location: L knee more painful than  R Pain Descriptors / Indicators: Aching;Operative site guarding Pain Intervention(s): Monitored during session;Repositioned     Hand Dominance     Extremity/Trunk Assessment Upper Extremity Assessment Upper Extremity Assessment: Overall WFL for tasks assessed   Lower Extremity Assessment Lower Extremity  Assessment: Defer to PT evaluation   Cervical / Trunk Assessment Cervical / Trunk Assessment: Normal   Communication Communication Communication: No difficulties   Cognition Arousal/Alertness: Awake/alert Behavior During Therapy: WFL for tasks assessed/performed Overall Cognitive Status: Within Functional Limits for tasks assessed                                     General Comments  wife present throughout session    Exercises Exercises: Total Joint Total Joint Exercises Long Arc Quad: AAROM;Both;5 reps;Seated Goniometric ROM: Approx 4-90 Right, 6-80 Left; Passivley   Shoulder Instructions      Home Living Family/patient expects to be discharged to:: Private residence Living Arrangements: Spouse/significant other Available Help at Discharge: Family;Available 24 hours/day Type of Home: House Home Access: Stairs to enter [employer] of Steps: 5 Entrance Stairs-Rails: Right;Left;Can reach both Home Layout: Two level;Able to live on main level with bedroom/bathroom     Bathroom Shower/Tub: Walk-in shower;Tub/shower unit(shower is very small)   Firefighter: Handicapped height Bathroom Accessibility: Yes How Accessible: Accessible via walker Home Equipment: Shower seat - built in          Prior Functioning/Environment Level of Independence: Independent        Comments: works as a Medical illustrator, enjoys playing with 2 y/o grandson        OT Problem List: Decreased range of motion;Decreased activity tolerance;Decreased strength;Impaired balance (sitting and/or standing);Decreased knowledge of use of DME or AE;Pain      OT Treatment/Interventions:      OT Goals(Current goals can be found in the care plan section) Acute Rehab OT Goals Patient Stated Goal: Christmas with his 57 year old OT Goal Formulation: With patient/family Potential to Achieve Goals: Good  OT Frequency:     Barriers to D/C:            Co-evaluation               AM-PAC OT "6 Clicks" Daily Activity     Outcome Measure Help from another person eating meals?: None Help from another person taking care of personal grooming?: None Help from another person toileting, which includes using toliet, bedpan, or urinal?: A Little Help from another person bathing (including washing, rinsing, drying)?: A Lot Help from another person to put on and taking off regular upper body clothing?: None Help from another person to put on and taking off regular lower body clothing?: A Lot 6 Click Score: 19   End of Session Equipment Utilized During Treatment: Gait belt;Rolling walker Nurse Communication: Mobility status  Activity Tolerance: Patient tolerated treatment well Patient left: in chair;with call bell/phone within reach;with family/visitor present  OT Visit Diagnosis: Unsteadiness on feet (R26.81);Other abnormalities of gait and mobility (R26.89);Pain Pain - Right/Left: (BIlateral) Pain - part of body: Knee                Time: 0981-1914 OT Time Calculation (min): 37 min Charges:  OT General Charges $OT Visit: 1 Visit OT Evaluation $OT Eval Moderate Complexity: 1 Mod OT Treatments $Self Care/Home Management : 8-22 mins  Sherryl Manges OTR/L Acute Rehabilitation Services Pager: [phone] Office: [phone]  Evern Bio Tracey Hermance 11/07/2018,  10:57 AM

## 2018-11-07 NOTE — Progress Notes (Signed)
Notified Case Manager Nira Conn) regarding the patient needing a walker and a tub bench for discharge.

## 2018-11-07 NOTE — Progress Notes (Signed)
Physical Therapy Treatment Patient Details Name: Jeffrey Ayala MRN: [id number] DOB: 22-Sep-1961 Today's Date: 11/07/2018    History of Present Illness 57 y.o. male s/p bil TKA. Hx of HTN, renal stones, and dysthymic disorder.    PT Comments    Continuing work on functional mobility and activity tolerance;  Much improved activity tolerance over yesterday afternoon; increased progressive amb distance, and initiated stair training; will plan to return for second session after lunch; on track for dc from PT standpoint; pt is hopeful to get home, await Cardiology input; will plan to reinforce stairs and therex this afternoon.  Follow Up Recommendations  Follow surgeon's recommendation for DC plan and follow-up therapies     Equipment Recommendations  Rolling walker with 5" wheels;3in1 (PT)    Recommendations for Other Services       Precautions / Restrictions Precautions Precautions: Knee Precaution Booklet Issued: Yes (comment) Precaution Comments: Pt educated to not allow any pillow or bolster under knee for healing with optimal range of motion.  Restrictions RLE Weight Bearing: Weight bearing as tolerated LLE Weight Bearing: Weight bearing as tolerated    Mobility  Bed Mobility               General bed mobility comments: EOB upon arrival  Transfers Overall transfer level: Needs assistance Equipment used: Rolling walker (2 wheeled) Transfers: Sit to/from Stand Sit to Stand: Mod assist;Min assist         General transfer comment: Mod assist to stand from bed without armrests to push up on; light min assist to stand from recliner with bil armrests; cues for technqiue and control  Ambulation/Gait Ambulation/Gait assistance: Min guard Gait Distance (Feet): 115 Feet Assistive device: Rolling walker (2 wheeled) Gait Pattern/deviations: Decreased step length - right;Decreased step length - left;Decreased stride length Gait velocity: slow   General Gait Details:  Able to walk outside of room in hallway; cues to stand tall and activate quads for knee stability in stance, and activate gluteals for core stability and posture; No buckling although using UEs for a moderate amount of support; cues for upright posture   Stairs Stairs: Yes Stairs assistance: Min assist Stair Management: One rail Right;Step to pattern;Sideways Number of Stairs: 2 General stair comments: verbal and demo cues for sequence and technqiue; moving slowly and with a considerable amount of pain, but smooth ascent and descent   Wheelchair Mobility    Modified Rankin (Stroke Patients Only)       Balance     Sitting balance-Leahy Scale: Good     Standing balance support: Bilateral upper extremity supported Standing balance-Leahy Scale: Poor                              Cognition Arousal/Alertness: Awake/alert Behavior During Therapy: WFL for tasks assessed/performed Overall Cognitive Status: Within Functional Limits for tasks assessed                                        Exercises Total Joint Exercises Long Arc QuadSinclair Ship;Both;5 reps;Seated Goniometric ROM: Approx 4-90 Right, 6-80 Left; Passivley    General Comments        Pertinent Vitals/Pain Pain Assessment: 0-10 Pain Score: 3 (zero R knee) Pain Location: L knee more painful than R Pain Descriptors / Indicators: Aching;Operative site guarding Pain Intervention(s): Monitored during session;Premedicated before session  Home Living                      Prior Function            PT Goals (current goals can now be found in the care plan section) Acute Rehab PT Goals Patient Stated Goal: Christmas with his 57 year old PT Goal Formulation: With patient/family Time For Goal Achievement: 11/19/18 Potential to Achieve Goals: Good Progress towards PT goals: Progressing toward goals    Frequency    7X/week      PT Plan Current plan remains appropriate     Co-evaluation              AM-PAC PT "6 Clicks" Mobility   Outcome Measure  Help needed turning from your back to your side while in a flat bed without using bedrails?: A Little Help needed moving from lying on your back to sitting on the side of a flat bed without using bedrails?: A Little Help needed moving to and from a bed to a chair (including a wheelchair)?: A Little Help needed standing up from a chair using your arms (e.g., wheelchair or bedside chair)?: A Little Help needed to walk in hospital room?: A Little Help needed climbing 3-5 steps with a railing? : A Lot 6 Click Score: 17    End of Session Equipment Utilized During Treatment: Gait belt Activity Tolerance: Patient tolerated treatment well Patient left: Other (comment)(in ortho gym with OT)   PT Visit Diagnosis: Difficulty in walking, not elsewhere classified (R26.2);Pain Pain - Right/Left: (bilateral) Pain - part of body: Knee     Time: 0802-2336 PT Time Calculation (min) (ACUTE ONLY): 20 min  Charges:  $Gait Training: 8-22 mins                     Roney Marion, [place]  Acute Rehabilitation Services Pager [phone] Office [phone]    Colletta [place] 11/07/2018, 10:08 AM

## 2018-11-07 NOTE — Progress Notes (Signed)
Patient ID: Jeffrey Ayala, male   DOB: 28-Nov-1960, 57 y.o.   MRN: [id number] Patient has had a cardiac monitor overnight.  Still has EKG changes without symptoms.  Discharge pending recommendations from cardiology.  If patient is safe from a cardiology standpoint I will discharge to home today he is to take an aspirin a day we will discontinue the wound VAC dressings and apply Mepilex today.  Patient with gait training had a loss of seal on the wound VAC drain.

## 2018-11-07 NOTE — Progress Notes (Signed)
Discharge instructions provided to the patient and his wife.  The patient has the correct equipment for home and will be followed up by [facility].

## 2018-11-07 NOTE — Care Management (Signed)
Called Jermain with [facility] for tub bench and walker. Magdalen Spatz RN BSN [phone]

## 2018-11-07 NOTE — Progress Notes (Signed)
12 lead EKG completed. Shows t wave abnormality/consider inferior ischemia. Will discuss with [name] when he arrives. Pt has no symptoms of chest pain, diaphoresis, or dyspnea. He does have an irregular pulse with multiple pvc and pacs.

## 2018-11-07 NOTE — Discharge Summary (Signed)
Discharge Diagnoses:  Active Problems:   Bilateral primary osteoarthritis of knee   S/P TKR (total knee replacement), bilateral   Surgeries: Procedure(s): BILATERAL TOTAL KNEE ARTHROPLASTY on 11/04/2018    Consultants:   Discharged Condition: Improved  Hospital Course: Jeffrey Ayala is an 57 y.o. male who was admitted 11/04/2018 with a chief complaint of osteoarthritis both knees, with a final diagnosis of Osteoarthritis Bilateral Knees.  Patient was brought to the operating room on 11/04/2018 and underwent Procedure(s): BILATERAL TOTAL KNEE ARTHROPLASTY.    Patient was given perioperative antibiotics:  Anti-infectives (From admission, onward)   Start     Dose/Rate Route Frequency Ordered Stop   11/04/18 1745  ceFAZolin (ANCEF) IVPB 1 g/50 mL premix     1 g 100 mL/hr over 30 Minutes Intravenous Every 6 hours 11/04/18 1733 11/05/18 0120   11/04/18 0645  ceFAZolin (ANCEF) IVPB 2g/100 mL premix     2 g 200 mL/hr over 30 Minutes Intravenous On call to O.R. 11/04/18 5643 11/04/18 0818   11/04/18 0635  ceFAZolin (ANCEF) 2-4 GM/100ML-% IVPB    Note to Pharmacy:  Nyoka Cowden   : cabinet override      11/04/18 3295 11/04/18 0748    .  Patient was given sequential compression devices, early ambulation, and aspirin for DVT prophylaxis.  Recent vital signs:  Patient Vitals for the past 24 hrs:  BP Temp Temp src Pulse Resp SpO2 Height Weight  11/07/18 1410 (!) 155/85 98.1 F (36.7 C) Oral (!) 106 18 98 % - -  11/07/18 0606 - - - - - - 5\' 10"  (1.778 m) 107.7 kg  11/07/18 0552 (!) 151/90 99.1 F (37.3 C) Oral 87 18 95 % - -  11/06/18 1957 (!) 162/70 99.9 F (37.7 C) Oral (!) 105 18 95 % - -  .  Recent laboratory studies: No results found.  Discharge Medications:   Allergies as of 11/07/2018      Reactions   Codeine Hives   Insomnia, jittery   Flomax [tamsulosin Hcl] Nausea Only   Nausea and fatigue      Medication List    TAKE these medications   acetaminophen 650  MG CR tablet Commonly known as:  TYLENOL Take 1,300 mg by mouth daily.   glucosamine-chondroitin 500-400 MG tablet Take 1 tablet by mouth daily.   naproxen sodium 220 MG tablet Commonly known as:  ALEVE Take 440 mg by mouth daily.   oxyCODONE-acetaminophen 5-325 MG tablet Commonly known as:  PERCOCET/ROXICET Take 1 tablet by mouth every 4 (four) hours as needed.   TURMERIC CURCUMIN PO Take 1 capsule by mouth daily.            Durable Medical Equipment  (From admission, onward)         Start     Ordered   11/07/18 1426  For home use only DME Tub bench  Once     11/07/18 1425   11/07/18 1424  For home use only DME Walker  Once    Question:  Patient needs a walker to treat with the following condition  Answer:  Knee joint replacement status   11/07/18 1425           Discharge Care Instructions  (From admission, onward)         Start     Ordered   11/04/18 0000  Weight bearing as tolerated    Question Answer Comment  Laterality bilateral   Extremity Lower  11/04/18 1525          Diagnostic Studies: No results found.  Patient benefited maximally from their hospital stay and there were no complications.     Disposition: Discharge disposition: 01-Home or Self Care      Discharge Instructions    Call MD / Call 911   Complete by:  As directed    If you experience chest pain or shortness of breath, CALL 911 and be transported to the hospital emergency room.  If you develope a fever above 101 F, pus (white drainage) or increased drainage or redness at the wound, or calf pain, call your surgeon's office.   Call MD / Call 911   Complete by:  As directed    If you experience chest pain or shortness of breath, CALL 911 and be transported to the hospital emergency room.  If you develope a fever above 101 F, pus (white drainage) or increased drainage or redness at the wound, or calf pain, call your surgeon's office.   Constipation Prevention   Complete by:   As directed    Drink plenty of fluids.  Prune juice may be helpful.  You may use a stool softener, such as Colace (over the counter) 100 mg twice a day.  Use MiraLax (over the counter) for constipation as needed.   Constipation Prevention   Complete by:  As directed    Drink plenty of fluids.  Prune juice may be helpful.  You may use a stool softener, such as Colace (over the counter) 100 mg twice a day.  Use MiraLax (over the counter) for constipation as needed.   Diet - low sodium heart healthy   Complete by:  As directed    Diet - low sodium heart healthy   Complete by:  As directed    Increase activity slowly as tolerated   Complete by:  As directed    Increase activity slowly as tolerated   Complete by:  As directed    Negative Pressure Wound Therapy - Incisional   Complete by:  As directed    Weight bearing as tolerated   Complete by:  As directed    Laterality:  bilateral   Extremity:  Lower     Follow-up Information    Follow up In 1 week.        [name], MD In 1 week.   Specialty:  Orthopedic Surgery Contact information: [place] [address] [phone]        [facility] Follow up.   Specialty:  [place] Why:  A representative from [facility] will contact you to arrange start date and time for your therapy. Contact information: [address] [place] [address] [phone]            Signed: [name] 11/07/2018, 5:07 PM

## 2018-11-07 NOTE — Progress Notes (Signed)
Progress Note  Patient Name: Jeffrey Ayala Date of Encounter: 11/07/2018  Primary Cardiologist: Candee Furbish, MD   Subjective   Once the epidural was removed he almost instantaneously felt better.  No longer dizzy or lightheaded.  Denies angina or dyspnea. Blood pressure remains elevated on the average 150/85-90  Inpatient Medications    Scheduled Meds: . acetaminophen  1,000 mg Oral Q6H  . aspirin EC  325 mg Oral Q breakfast  . docusate sodium  100 mg Oral BID   Continuous Infusions: . sodium chloride 10 mL/hr at 11/06/18 0007  . methocarbamol (ROBAXIN) IV    . ropivacaine (PF) 2 mg/mL (0.2%) 10 mL/hr (11/06/18 0453)   PRN Meds: bisacodyl, HYDROmorphone (DILAUDID) injection, HYDROmorphone, ketorolac, magnesium citrate, menthol-cetylpyridinium **OR** phenol, methocarbamol **OR** methocarbamol (ROBAXIN) IV, metoCLOPramide **OR** metoCLOPramide (REGLAN) injection, ondansetron **OR** ondansetron (ZOFRAN) IV, polyethylene glycol   Vital Signs    Vitals:   11/06/18 1500 11/06/18 1957 11/07/18 0552 11/07/18 0606  BP: (!) 152/82 (!) 162/70 (!) 151/90   Pulse: 97 (!) 105 87   Resp:  18 18   Temp: 99.9 F (37.7 C) 99.9 F (37.7 C) 99.1 F (37.3 C)   TempSrc: Oral Oral Oral   SpO2: 91% 95% 95%   Weight:    107.7 kg  Height:    5\' 10"  (1.778 m)    Intake/Output Summary (Last 24 hours) at 11/07/2018 1147 Last data filed at 11/07/2018 0850 Gross per 24 hour  Intake 640 ml  Output 500 ml  Net 140 ml   Filed Weights   11/07/18 0606  Weight: 107.7 kg    Telemetry    n/a - Personally Reviewed  ECG    Tracing- Personally Reviewed  Physical Exam  Obese GEN: No acute distress.   Neck: No JVD Cardiac: RRR with rare ectopy, no murmurs, rubs, or gallops.  Respiratory: Clear to auscultation bilaterally. GI: Soft, nontender, non-distended  MS: No edema; No deformity. Neuro:  Nonfocal  Psych: Normal affect   Labs    Chemistry Recent Labs  Lab 11/06/18 1009    NA 137  K 3.2*  CL 99  CO2 27  GLUCOSE 163*  BUN 10  CREATININE 1.01  CALCIUM 8.7*  GFRNONAA >60  GFRAA >60  ANIONGAP 11     Hematology Recent Labs  Lab 11/06/18 1009  WBC 10.9*  RBC 3.51*  HGB 10.0*  HCT 31.8*  MCV 90.6  MCH 28.5  MCHC 31.4  RDW 14.2  PLT 254    Cardiac Enzymes Recent Labs  Lab 11/04/18 1228  TROPONINI <0.03   No results for input(s): TROPIPOC in the last 168 hours.   BNPNo results for input(s): BNP, PROBNP in the last 168 hours.   DDimer No results for input(s): DDIMER in the last 168 hours.   Radiology    No results found.  Cardiac Studies   2011 echo - Left ventricle: The cavity size was normal. Wall thickness was  increased in a pattern of mild LVH. Systolic function was normal.  The estimated ejection fraction was in the range of 55% to 60%.  Wall motion was normal; there were no regional wall motion  abnormalities. Doppler parameters are consistent with abnormal  left ventricular relaxation (grade 1 diastolic dysfunction). - Aortic valve: Trivial regurgitation.  Patient Profile     57 y.o. male with complaints of dizziness in the setting of PACs/PVCs, hypokalemia, epidural analgesia following bilateral total knee replacement  Assessment & Plan    He  probably does have systemic hypertension, but this does not require immediate therapy.  Would reevaluate once the hyperadrenergic state of postoperative pain and anxiety have resolved.  Encouraged him to check blood pressure at home, at rest and bring a log to his office appointment. Also encouraged weight loss, avoiding excessive sodium and regular exercise.  We will make arrangements for follow-up in our office. [facility] will sign off.   Medication Recommendations:  No change Other recommendations (labs, testing, etc):  n/a Follow up as an outpatient: We will arrange follow-up with Dr. Marlou Porch  For questions or updates, please contact [facility] Please  consult [email or web address] for contact info under        Signed, Sanda Klein, MD  11/07/2018, 11:47 AM

## 2018-11-07 NOTE — Progress Notes (Signed)
Physical Therapy Treatment Patient Details Name: Jeffrey Ayala MRN: [id number] DOB: [birth year] Today's Date: 11/07/2018    History of Present Illness 57 y.o. male s/p bil TKA. Hx of HTN, renal stones, and dysthymic disorder.    PT Comments    Continuing work on functional mobility and activity tolerance;  Discussed car transfer, and practiced stairs again -- managing well!  Performed a full set of bilateral exercises; still with quad lag with straight leg raises, but progressed to being able to lift both legs (one at a time) against gravity; Informed Caren Griffins, RN of need for RW and tub transfer bench; OK for dc home from PT standpoint    Follow Up Recommendations  Follow surgeon's recommendation for DC plan and follow-up therapies     Equipment Recommendations  Rolling walker with 5" wheels;3in1 (PT);Other (comment)(and tub transfer bench per OT)    Recommendations for Other Services       Precautions / Restrictions Precautions Precautions: Knee Precaution Booklet Issued: Yes (comment) Precaution Comments: Pt educated to not allow any pillow or bolster under knee for healing with optimal range of motion.  Restrictions RLE Weight Bearing: Weight bearing as tolerated LLE Weight Bearing: Weight bearing as tolerated    Mobility  Bed Mobility Overal bed mobility: Needs Assistance Bed Mobility: Supine to Sit;Sit to Supine     Supine to sit: Min guard Sit to supine: Min guard   General bed mobility comments: Cues for technqieu; slow moving, but not needing physical assist  Transfers Overall transfer level: Needs assistance Equipment used: Rolling walker (2 wheeled) Transfers: Sit to/from Stand Sit to Stand: Min guard;Min assist         General transfer comment: min guard assist for safety from elevated surfaces and chairs with arm rests  Ambulation/Gait Ambulation/Gait assistance: Min guard Gait Distance (Feet): 120 Feet Assistive device: Rolling walker (2  wheeled) Gait Pattern/deviations: Decreased step length - right;Decreased step length - left;Decreased stride length Gait velocity: slow   General Gait Details: Able to walk outside of room in hallway; cues to stand tall and activate quads for knee stability in stance, and activate gluteals for core stability and posture; No buckling although using UEs for a moderate amount of support; cues for upright posture   Stairs Stairs: Yes Stairs assistance: Min guard Stair Management: One rail Right;Step to pattern;Sideways Number of Stairs: 3 General stair comments: verbal and demo cues for sequence and technqiue; moving slowly and with a considerable amount of pain, but smooth ascent and descent   Wheelchair Mobility    Modified Rankin (Stroke Patients Only)       Balance     Sitting balance-Leahy Scale: Good       Standing balance-Leahy Scale: Fair Standing balance comment: able to sustain brief static standing without BUE support                            Cognition Arousal/Alertness: Awake/alert Behavior During Therapy: WFL for tasks assessed/performed Overall Cognitive Status: Within Functional Limits for tasks assessed                                        Exercises Total Joint Exercises Ankle Circles/Pumps: AROM;Both;10 reps;Supine Quad Sets: Strengthening;Both;20 reps;Supine Short Arc Quad: AAROM;Right;Left;10 reps;Supine(alternating) Heel Slides: AROM;AAROM;Right;Left;10 reps;Supine(alternating) Straight Leg Raises: Strengthening;Right;Left;10 reps(alternating; quad lag present, but improving)    General  Comments General comments (skin integrity, edema, etc.): wife present throughout session      Pertinent Vitals/Pain Pain Assessment: Faces Faces Pain Scale: Hurts even more Pain Location: L knee more painful than R; Grimacing with therex Pain Descriptors / Indicators: Aching;Operative site guarding Pain Intervention(s): Monitored  during session    Home Living                      Prior Function            PT Goals (current goals can now be found in the care plan section) Acute Rehab PT Goals Patient Stated Goal: Christmas with his 57 year old PT Goal Formulation: With patient/family Time For Goal Achievement: 11/19/18 Potential to Achieve Goals: Good Progress towards PT goals: Progressing toward goals    Frequency    7X/week      PT Plan Current plan remains appropriate    Co-evaluation              AM-PAC PT "6 Clicks" Mobility   Outcome Measure  Help needed turning from your back to your side while in a flat bed without using bedrails?: A Little Help needed moving from lying on your back to sitting on the side of a flat bed without using bedrails?: A Little Help needed moving to and from a bed to a chair (including a wheelchair)?: A Little Help needed standing up from a chair using your arms (e.g., wheelchair or bedside chair)?: A Little Help needed to walk in hospital room?: A Little Help needed climbing 3-5 steps with a railing? : A Little 6 Click Score: 18    End of Session   Activity Tolerance: Patient tolerated treatment well Patient left: in chair;with call bell/phone within reach;with family/visitor present Nurse Communication: Mobility status;Other (comment)(OK for dc from PT standpoint) PT Visit Diagnosis: Difficulty in walking, not elsewhere classified (R26.2);Pain Pain - Right/Left: (bilateral) Pain - part of body: Knee     Time: 1333-1415 PT Time Calculation (min) (ACUTE ONLY): 42 min  Charges:  $Gait Training: 8-22 mins $Therapeutic Exercise: 23-37 mins                     Roney Marion, PT  Acute Rehabilitation Services Pager [phone] Office [place] 11/07/2018, 3:11 PM

## 2018-11-07 NOTE — Progress Notes (Signed)
Patient ID: Jeffrey Ayala, male   DOB: [birth year], 57 y.o.   MRN: [id number] Patient is cleared by cardiology for follow-up as an outpatient.  Patient is to see physical therapy 1 more time before discharge.  Patient's DME has been arranged for delivery at home.  I will follow-up in the office in 1 week.

## 2018-11-09 ENCOUNTER — Telehealth (INDEPENDENT_AMBULATORY_CARE_PROVIDER_SITE_OTHER): Payer: Self-pay | Admitting: Orthopedic Surgery

## 2018-11-09 ENCOUNTER — Ambulatory Visit (INDEPENDENT_AMBULATORY_CARE_PROVIDER_SITE_OTHER): Payer: BLUE CROSS/BLUE SHIELD | Admitting: Family

## 2018-11-09 DIAGNOSIS — Z471 Aftercare following joint replacement surgery: Secondary | ICD-10-CM | POA: Diagnosis not present

## 2018-11-09 DIAGNOSIS — G43909 Migraine, unspecified, not intractable, without status migrainosus: Secondary | ICD-10-CM | POA: Diagnosis not present

## 2018-11-09 DIAGNOSIS — Z9181 History of falling: Secondary | ICD-10-CM | POA: Diagnosis not present

## 2018-11-09 DIAGNOSIS — I10 Essential (primary) hypertension: Secondary | ICD-10-CM | POA: Diagnosis not present

## 2018-11-09 DIAGNOSIS — Z7982 Long term (current) use of aspirin: Secondary | ICD-10-CM | POA: Diagnosis not present

## 2018-11-09 DIAGNOSIS — Z87442 Personal history of urinary calculi: Secondary | ICD-10-CM | POA: Diagnosis not present

## 2018-11-09 DIAGNOSIS — Z96653 Presence of artificial knee joint, bilateral: Secondary | ICD-10-CM | POA: Diagnosis not present

## 2018-11-09 NOTE — Telephone Encounter (Signed)
Can you call?

## 2018-11-09 NOTE — Telephone Encounter (Signed)
Done

## 2018-11-09 NOTE — Telephone Encounter (Signed)
[facility]/PT/[facility] called wanting verbal orders on PT  2x week 1week 3x week 1week  Please call to advise [phone]

## 2018-11-14 ENCOUNTER — Ambulatory Visit (INDEPENDENT_AMBULATORY_CARE_PROVIDER_SITE_OTHER): Payer: BLUE CROSS/BLUE SHIELD | Admitting: Physician Assistant

## 2018-11-14 ENCOUNTER — Encounter (INDEPENDENT_AMBULATORY_CARE_PROVIDER_SITE_OTHER): Payer: Self-pay | Admitting: Physician Assistant

## 2018-11-14 VITALS — Ht 70.0 in | Wt 237.0 lb

## 2018-11-14 DIAGNOSIS — Z96653 Presence of artificial knee joint, bilateral: Secondary | ICD-10-CM

## 2018-11-14 DIAGNOSIS — M1A0720 Idiopathic chronic gout, left ankle and foot, without tophus (tophi): Secondary | ICD-10-CM | POA: Diagnosis not present

## 2018-11-14 MED ORDER — COLCHICINE-PROBENECID 0.5-500 MG PO TABS: 1.0000 | ORAL_TABLET | Freq: Two times a day (BID) | ORAL | 1 refills | Status: DC

## 2018-11-14 MED ORDER — DOXYCYCLINE HYCLATE 100 MG PO CAPS: 100.0000 mg | ORAL_CAPSULE | Freq: Two times a day (BID) | ORAL | 1 refills | Status: DC

## 2018-11-14 MED ORDER — ALLOPURINOL 100 MG PO TABS: 100.0000 mg | ORAL_TABLET | Freq: Every day | ORAL | 11 refills | Status: DC

## 2018-11-14 NOTE — Addendum Note (Signed)
Addended by: Pamella Pert on: 11/14/2018 02:31 PM   Modules accepted: Orders

## 2018-11-14 NOTE — Progress Notes (Signed)
Office Visit Note   Patient: Jeffrey Ayala           Date of Birth: March 13, 1961           MRN: [id number] Visit Date: 11/14/2018              Requested by: No referring provider defined for this encounter. PCP: Patient, No Pcp Per  Chief Complaint  Patient presents with  . Right Knee - Routine Post Op    11/04/18 bilateral total knee replacements  . Left Knee - Routine Post Op      HPI: The patient is a 57 year old gentleman here with his wife for postoperative follow-up following bilateral total knee replacements on 11/04/2018 for osteoarthritis.  He is 10 days postop.  He had Praveena VAC on but reports that he has had to be taken off in the hospital.  He presents stating he has been doing well at home overall but is having a lot of left ankle pain.  He reports a history of gout in his left ankle previously and he did started taking some prednisone for this.  He has seen physical therapy for 1 visit since his discharge last week.  He reports that he has been doing surprisingly well with pain from the standpoint of his knees but his ankle is really bothering him more.  We discussed starting some colchicine and then following this with some allopurinol and also checking his uric acid level today and he is in agreement.  Assessment & Plan: Visit Diagnoses:  1. Status post bilateral knee replacements   2. Idiopathic chronic gout of left ankle without tophus     Plan: Can start some colchicine 0.6 mg p.o. twice daily for the next few days and once his symptoms in his left ankle are improved he will then start allopurinol 100 mg daily.  Also can start some doxycycline 100 mg p.o. twice daily for the next 14 days for his slight right knee peri-incisional erythema.  He should continue to have home health physical therapy.  He will follow-up next week or sooner should he have difficulties in the interim.  We will call him later this week with the results of his uric acid level.  He will need  x-rays at next week's visit.  Follow-Up Instructions: Return in about 10 days (around 11/24/2018).   Ortho Exam  Patient is alert, oriented, no adenopathy, well-dressed, normal affect, normal respiratory effort. Bilateral knee incisions are intact and without drainage.  There is some slight erythema over the medial central incision line on the right.  Staples are intact.  Bilateral knee range of motion lacks approximately 5 degrees of extension and he has between 60 and 70 degrees of flexion actively.  He has some edema over the left ankle joint is tender to palpation.  He has good pedal pulses bilaterally.  Imaging: No results found.   Labs: Lab Results  Component Value Date   LABURIC 7.4 04/10/2014     Lab Results  Component Value Date   LABURIC 7.4 04/10/2014    Body mass index is 34.01 kg/m.  Orders:  No orders of the defined types were placed in this encounter.  Meds ordered this encounter  Medications  . colchicine-probenecid 0.5-500 MG tablet    Sig: Take 1 tablet by mouth 2 (two) times daily.    Dispense:  30 tablet    Refill:  1  . allopurinol (ZYLOPRIM) 100 MG tablet    Sig: Take 1  tablet (100 mg total) by mouth daily.    Dispense:  30 tablet    Refill:  11    Start after colchicine stopped in 3 to 4 days.  Marland Kitchen doxycycline (VIBRAMYCIN) 100 MG capsule    Sig: Take 1 capsule (100 mg total) by mouth 2 (two) times daily.    Dispense:  28 capsule    Refill:  1     Procedures: No procedures performed  Clinical Data: No additional findings.  ROS:  All other systems negative, except as noted in the HPI. Review of Systems  Objective: Vital Signs: Ht 5\' 10"  (1.778 m)   Wt 237 lb (107.5 kg)   BMI 34.01 kg/m   Specialty Comments:  No specialty comments available.  PMFS History: Patient Active Problem List   Diagnosis Date Noted  . S/P TKR (total knee replacement), bilateral 11/04/2018  . Bilateral primary osteoarthritis of knee   . Rupture of medial  head of gastrocnemius, left, sequela 01/14/2017  . Kidney stones 06/22/2016  . HYPERTENSION 08/01/2010  . DYSTHYMIC DISORDER 07/08/2010  . PALPITATIONS 07/08/2010   Past Medical History:  Diagnosis Date  . Concussion   . History of kidney stones   . Migraine     Family History  Problem Relation Age of Onset  . COPD Mother     Past Surgical History:  Procedure Laterality Date  . CYSTOSCOPY    . DENTAL SURGERY    . FINGER SURGERY    . HERNIA REPAIR     at age 35  . KNEE ARTHROSCOPY Left   . LITHOTRIPSY    . NEPHROLITHOTOMY    . TONSILLECTOMY    . TOTAL KNEE ARTHROPLASTY Bilateral 11/04/2018   Procedure: BILATERAL TOTAL KNEE ARTHROPLASTY;  Surgeon: [name], MD;  Location: [facility];  Service: Orthopedics;  Laterality: Bilateral;  spinal/epidural per anesthesiologist   Social History   Occupational History  . Not on file  Tobacco Use  . Smoking status: Never Smoker  . Smokeless tobacco: Never Used  Substance and Sexual Activity  . Alcohol use: Yes    Comment: occassional  . Drug use: No  . Sexual activity: Not on file

## 2018-11-15 DIAGNOSIS — Z471 Aftercare following joint replacement surgery: Secondary | ICD-10-CM | POA: Diagnosis not present

## 2018-11-15 DIAGNOSIS — Z7982 Long term (current) use of aspirin: Secondary | ICD-10-CM | POA: Diagnosis not present

## 2018-11-15 DIAGNOSIS — Z9181 History of falling: Secondary | ICD-10-CM | POA: Diagnosis not present

## 2018-11-15 DIAGNOSIS — G43909 Migraine, unspecified, not intractable, without status migrainosus: Secondary | ICD-10-CM | POA: Diagnosis not present

## 2018-11-15 DIAGNOSIS — I10 Essential (primary) hypertension: Secondary | ICD-10-CM | POA: Diagnosis not present

## 2018-11-15 DIAGNOSIS — Z87442 Personal history of urinary calculi: Secondary | ICD-10-CM | POA: Diagnosis not present

## 2018-11-15 DIAGNOSIS — Z96653 Presence of artificial knee joint, bilateral: Secondary | ICD-10-CM | POA: Diagnosis not present

## 2018-11-15 LAB — URIC ACID: Uric Acid, Serum: 6.3 mg/dL (ref 4.0–8.0)

## 2018-11-17 ENCOUNTER — Telehealth (INDEPENDENT_AMBULATORY_CARE_PROVIDER_SITE_OTHER): Payer: Self-pay | Admitting: Physician Assistant

## 2018-11-17 DIAGNOSIS — Z9181 History of falling: Secondary | ICD-10-CM | POA: Diagnosis not present

## 2018-11-17 DIAGNOSIS — G43909 Migraine, unspecified, not intractable, without status migrainosus: Secondary | ICD-10-CM | POA: Diagnosis not present

## 2018-11-17 DIAGNOSIS — Z96653 Presence of artificial knee joint, bilateral: Secondary | ICD-10-CM | POA: Diagnosis not present

## 2018-11-17 DIAGNOSIS — Z471 Aftercare following joint replacement surgery: Secondary | ICD-10-CM | POA: Diagnosis not present

## 2018-11-17 DIAGNOSIS — I10 Essential (primary) hypertension: Secondary | ICD-10-CM | POA: Diagnosis not present

## 2018-11-17 DIAGNOSIS — Z87442 Personal history of urinary calculi: Secondary | ICD-10-CM | POA: Diagnosis not present

## 2018-11-17 DIAGNOSIS — Z7982 Long term (current) use of aspirin: Secondary | ICD-10-CM | POA: Diagnosis not present

## 2018-11-17 NOTE — Telephone Encounter (Signed)
PT called to say she is releasing PT and next week he will be ready for outpatient rehab he just needs a script

## 2018-11-18 DIAGNOSIS — Z87442 Personal history of urinary calculi: Secondary | ICD-10-CM | POA: Diagnosis not present

## 2018-11-18 DIAGNOSIS — G43909 Migraine, unspecified, not intractable, without status migrainosus: Secondary | ICD-10-CM | POA: Diagnosis not present

## 2018-11-18 DIAGNOSIS — I10 Essential (primary) hypertension: Secondary | ICD-10-CM | POA: Diagnosis not present

## 2018-11-18 DIAGNOSIS — Z7982 Long term (current) use of aspirin: Secondary | ICD-10-CM | POA: Diagnosis not present

## 2018-11-18 DIAGNOSIS — Z9181 History of falling: Secondary | ICD-10-CM | POA: Diagnosis not present

## 2018-11-18 DIAGNOSIS — Z96653 Presence of artificial knee joint, bilateral: Secondary | ICD-10-CM | POA: Diagnosis not present

## 2018-11-18 DIAGNOSIS — Z471 Aftercare following joint replacement surgery: Secondary | ICD-10-CM | POA: Diagnosis not present

## 2018-11-18 NOTE — Telephone Encounter (Signed)
Jeffrey Ayala for outpatient PT for bilat total knee arthroplasty?

## 2018-11-18 NOTE — Telephone Encounter (Signed)
Yes, He is okay for outpatient PT for progressive gait, range of motion and strengthening. Thank you.

## 2018-11-21 NOTE — Telephone Encounter (Signed)
FYI.... I called patient and put referral into system. Patient states when HHPT was last there, they had him do exercises which seemed to hurt. He has noticed more swelling since then. I advised ok to not start outpatient PT until after follow up appt in the office on 11/24/18.

## 2018-11-24 ENCOUNTER — Ambulatory Visit (INDEPENDENT_AMBULATORY_CARE_PROVIDER_SITE_OTHER): Payer: Self-pay

## 2018-11-24 ENCOUNTER — Ambulatory Visit (INDEPENDENT_AMBULATORY_CARE_PROVIDER_SITE_OTHER): Payer: BLUE CROSS/BLUE SHIELD

## 2018-11-24 ENCOUNTER — Ambulatory Visit (INDEPENDENT_AMBULATORY_CARE_PROVIDER_SITE_OTHER): Payer: BLUE CROSS/BLUE SHIELD | Admitting: Orthopedic Surgery

## 2018-11-24 VITALS — Ht 70.0 in | Wt 237.0 lb

## 2018-11-24 DIAGNOSIS — Z96653 Presence of artificial knee joint, bilateral: Secondary | ICD-10-CM | POA: Diagnosis not present

## 2018-11-24 DIAGNOSIS — M25562 Pain in left knee: Secondary | ICD-10-CM

## 2018-11-30 ENCOUNTER — Telehealth (INDEPENDENT_AMBULATORY_CARE_PROVIDER_SITE_OTHER): Payer: Self-pay | Admitting: Orthopedic Surgery

## 2018-11-30 NOTE — Telephone Encounter (Signed)
Called pt and offered appt for today and he states that he is out of town and can not come in until tomorrow afternoon. sch appt for 2:45 tomorrow for eval.

## 2018-11-30 NOTE — Telephone Encounter (Signed)
Patient left message stating his knees was warm to touch & swollen. Pt request call back @ [phone]

## 2018-12-01 ENCOUNTER — Ambulatory Visit (INDEPENDENT_AMBULATORY_CARE_PROVIDER_SITE_OTHER): Payer: Self-pay

## 2018-12-01 ENCOUNTER — Encounter (INDEPENDENT_AMBULATORY_CARE_PROVIDER_SITE_OTHER): Payer: Self-pay | Admitting: Physician Assistant

## 2018-12-01 ENCOUNTER — Ambulatory Visit (INDEPENDENT_AMBULATORY_CARE_PROVIDER_SITE_OTHER): Payer: BLUE CROSS/BLUE SHIELD | Admitting: Physician Assistant

## 2018-12-01 ENCOUNTER — Other Ambulatory Visit (INDEPENDENT_AMBULATORY_CARE_PROVIDER_SITE_OTHER): Payer: Self-pay

## 2018-12-01 ENCOUNTER — Telehealth (INDEPENDENT_AMBULATORY_CARE_PROVIDER_SITE_OTHER): Payer: Self-pay

## 2018-12-01 VITALS — Ht 70.0 in | Wt 237.0 lb

## 2018-12-01 DIAGNOSIS — M25662 Stiffness of left knee, not elsewhere classified: Secondary | ICD-10-CM | POA: Diagnosis not present

## 2018-12-01 DIAGNOSIS — M25562 Pain in left knee: Secondary | ICD-10-CM | POA: Diagnosis not present

## 2018-12-01 DIAGNOSIS — Z96653 Presence of artificial knee joint, bilateral: Secondary | ICD-10-CM

## 2018-12-01 DIAGNOSIS — M25661 Stiffness of right knee, not elsewhere classified: Secondary | ICD-10-CM | POA: Diagnosis not present

## 2018-12-01 DIAGNOSIS — M1A0720 Idiopathic chronic gout, left ankle and foot, without tophus (tophi): Secondary | ICD-10-CM

## 2018-12-01 DIAGNOSIS — M25561 Pain in right knee: Secondary | ICD-10-CM | POA: Diagnosis not present

## 2018-12-01 MED ORDER — DOXYCYCLINE HYCLATE 100 MG PO CAPS: 100.0000 mg | ORAL_CAPSULE | Freq: Two times a day (BID) | ORAL | 1 refills | Status: DC

## 2018-12-01 MED ORDER — TRAMADOL HCL 50 MG PO TABS: 50.0000 mg | ORAL_TABLET | Freq: Four times a day (QID) | ORAL | 0 refills | Status: DC | PRN

## 2018-12-01 NOTE — Telephone Encounter (Signed)
Jeffrey Ayala with [facility].needs an order PT faxed to [phone].  Cb#  [phone].  Please advise.  Thank you.

## 2018-12-02 ENCOUNTER — Encounter (INDEPENDENT_AMBULATORY_CARE_PROVIDER_SITE_OTHER): Payer: Self-pay | Admitting: Physician Assistant

## 2018-12-02 NOTE — Telephone Encounter (Signed)
Order faxed this morning. PT for bilateral total knee replacements.

## 2018-12-02 NOTE — Progress Notes (Signed)
Office Visit Note   Patient: Jeffrey Ayala           Date of Birth: 10-12-1961           MRN: [id number] Visit Date: 12/01/2018              Requested by: No referring provider defined for this encounter. PCP: Patient, No Pcp Per  Chief Complaint  Patient presents with  . Right Knee - Routine Post Op    TKA  . Left Knee - Routine Post Op      HPI: The patient is a 58 year old gentleman who is seen for postoperative follow-up following bilateral total knee replacements on 11/04/2018 for osteoarthritis.  He did have the Praveena VAC postoperatively and this was removed at the last visit.  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  He has been working with physical therapy.  He comes in today reporting that his knees feel stiffer than they have and he was worried about some warmth in the knees.  There is been no redness.  The incisions are intact and he has not noticed any problems with these.  He reports that he was initially supposed to work half days but he has been working longer hours.  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  Assessment & Plan: Visit Diagnoses:  1. History of total knee arthroplasty, bilateral   2. 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  He does report that he has to drive to sites quite a bit and has not been wearing any compression stockings and we discussed wearing some thigh-high compression to go up and over his knee incisions and he is going to try to get something for this.  Were also going to start a brief course of doxycycline 100 mg p.o. twice daily although there are no clear signs of infection nail but the patient is headed out of town and will not be  back until sometime late week.  Orders were also sent to [facility] physical therapy for the patient to continue with outpatient therapies to progress with gait range of motion and strengthening.  He is going to come back next Thursday for follow-up.  Follow-Up Instructions: Return in about 1 week (around 12/08/2018).   Ortho Exam  Patient is alert, oriented, no adenopathy, well-dressed, normal affect, normal respiratory effort. Bilateral knee incisions are clean dry and intact.  There is no erythema.  There is some mild localized swelling and some mild increase in warmth but no other signs of cellulitis.  Bilateral knees are stable.  He has good strength in range of motion at least 0-95 degrees bilaterally.   Imaging: No results found.   Labs: Lab Results  Component Value Date   LABURIC 6.3 11/14/2018   LABURIC 7.4 04/10/2014     Lab Results  Component Value Date   LABURIC 6.3 11/14/2018   LABURIC 7.4 04/10/2014    Body mass index is 34.01 kg/m.  Orders:  No orders of the defined types were placed in this encounter.  Meds ordered this encounter  Medications  . traMADol (ULTRAM) 50 MG tablet  Sig: Take 1 tablet (50 mg total) by mouth every 6 (six) hours as needed for moderate pain or severe pain.    Dispense:  28 tablet    Refill:  0  . doxycycline (VIBRAMYCIN) 100 MG capsule    Sig: Take 1 capsule (100 mg total) by mouth 2 (two) times daily.    Dispense:  28 capsule    Refill:  1     Procedures: No procedures performed  Clinical Data: No additional findings.  ROS:  All other systems negative, except as noted in the HPI. Review of Systems  Objective: Vital Signs: Ht 5\' 10"  (1.778 m)   Wt 237 lb (107.5 kg)   BMI 34.01 kg/m   Specialty Comments:  No specialty comments available.  PMFS History: Patient Active Problem List   Diagnosis Date Noted  . S/P TKR (total knee replacement), bilateral 11/04/2018  . Bilateral primary osteoarthritis of knee   .  Rupture of medial head of gastrocnemius, left, sequela 01/14/2017  . Kidney stones 06/22/2016  . HYPERTENSION 08/01/2010  . DYSTHYMIC DISORDER 07/08/2010  . PALPITATIONS 07/08/2010   Past Medical History:  Diagnosis Date  . Concussion   . History of kidney stones   . Migraine     Family History  Problem Relation Age of Onset  . COPD Mother     Past Surgical History:  Procedure Laterality Date  . CYSTOSCOPY    . DENTAL SURGERY    . FINGER SURGERY    . HERNIA REPAIR     at age 20  . KNEE ARTHROSCOPY Left   . LITHOTRIPSY    . NEPHROLITHOTOMY    . TONSILLECTOMY    . TOTAL KNEE ARTHROPLASTY Bilateral 11/04/2018   Procedure: BILATERAL TOTAL KNEE ARTHROPLASTY;  Surgeon: Nadara Mustard, MD;  Location: [facility];  Service: Orthopedics;  Laterality: Bilateral;  spinal/epidural per anesthesiologist   Social History   Occupational History  . Not on file  Tobacco Use  . Smoking status: Never Smoker  . Smokeless tobacco: Never Used  Substance and Sexual Activity  . Alcohol use: Yes    Comment: occassional  . Drug use: No  . Sexual activity: Not on file

## 2018-12-04 ENCOUNTER — Encounter (INDEPENDENT_AMBULATORY_CARE_PROVIDER_SITE_OTHER): Payer: Self-pay | Admitting: Orthopedic Surgery

## 2018-12-04 NOTE — Progress Notes (Signed)
Office Visit Note   Patient: Jeffrey Ayala           Date of Birth: 12-18-1960           MRN: [id number] Visit Date: 11/24/2018              Requested by: No referring provider defined for this encounter. PCP: Patient, No Pcp Per  Chief Complaint  Patient presents with  . Right Knee - Routine Post Op  . Left Knee - Routine Post Op      HPI: Patient is a 58 year old gentleman who is 3 weeks status post bilateral total knee arthroplasties.  Patient was started on doxycycline and colchicine he is weightbearing with a cane.  Patient states he has edema in his feet feels like he has a catch in his knee in the morning uses Tylenol for pain complains of tenderness and inflammation in his feet he states he has not switched allopurinol yet.  Assessment & Plan: Visit Diagnoses:  1. History of total knee arthroplasty, bilateral     Plan: Recommended 100 mg of allopurinol once a day colchicine once a day we will repeat a uric acid at follow-up.  Follow-Up Instructions: Return in about 3 weeks (around 12/15/2018).   Ortho Exam  Patient is alert, oriented, no adenopathy, well-dressed, normal affect, normal respiratory effort. Examination patient has no redness no swelling no cellulitis in either knee the knees have good passive range of motion.  Imaging: No results found. No images are attached to the encounter.  Labs: Lab Results  Component Value Date   LABURIC 6.3 11/14/2018   LABURIC 7.4 04/10/2014     Lab Results  Component Value Date   LABURIC 6.3 11/14/2018   LABURIC 7.4 04/10/2014    Body mass index is 34.01 kg/m.  Orders:  Orders Placed This Encounter  Procedures  . XR Knee 1-2 Views Right  . XR Knee 1-2 Views Left   No orders of the defined types were placed in this encounter.    Procedures: No procedures performed  Clinical Data: No additional findings.  ROS:  All other systems negative, except as noted in the HPI. Review of  Systems  Objective: Vital Signs: Ht 5\' 10"  (1.778 m)   Wt 237 lb (107.5 kg)   BMI 34.01 kg/m   Specialty Comments:  No specialty comments available.  PMFS History: Patient Active Problem List   Diagnosis Date Noted  . S/P TKR (total knee replacement), bilateral 11/04/2018  . Bilateral primary osteoarthritis of knee   . Rupture of medial head of gastrocnemius, left, sequela 01/14/2017  . Kidney stones 06/22/2016  . HYPERTENSION 08/01/2010  . DYSTHYMIC DISORDER 07/08/2010  . PALPITATIONS 07/08/2010   Past Medical History:  Diagnosis Date  . Concussion   . History of kidney stones   . Migraine     Family History  Problem Relation Age of Onset  . COPD Mother     Past Surgical History:  Procedure Laterality Date  . CYSTOSCOPY    . DENTAL SURGERY    . FINGER SURGERY    . HERNIA REPAIR     at age 2  . KNEE ARTHROSCOPY Left   . LITHOTRIPSY    . NEPHROLITHOTOMY    . TONSILLECTOMY    . TOTAL KNEE ARTHROPLASTY Bilateral 11/04/2018   Procedure: BILATERAL TOTAL KNEE ARTHROPLASTY;  Surgeon: [name], MD;  Location: [facility];  Service: Orthopedics;  Laterality: Bilateral;  spinal/epidural per anesthesiologist   Social History  Occupational History  . Not on file  Tobacco Use  . Smoking status: Never Smoker  . Smokeless tobacco: Never Used  Substance and Sexual Activity  . Alcohol use: Yes    Comment: occassional  . Drug use: No  . Sexual activity: Not on file

## 2018-12-08 ENCOUNTER — Ambulatory Visit (INDEPENDENT_AMBULATORY_CARE_PROVIDER_SITE_OTHER): Payer: BLUE CROSS/BLUE SHIELD | Admitting: Physician Assistant

## 2018-12-08 ENCOUNTER — Encounter (INDEPENDENT_AMBULATORY_CARE_PROVIDER_SITE_OTHER): Payer: Self-pay | Admitting: Physician Assistant

## 2018-12-08 VITALS — Ht 70.0 in | Wt 237.0 lb

## 2018-12-08 DIAGNOSIS — M1A0720 Idiopathic chronic gout, left ankle and foot, without tophus (tophi): Secondary | ICD-10-CM

## 2018-12-08 DIAGNOSIS — Z96653 Presence of artificial knee joint, bilateral: Secondary | ICD-10-CM

## 2018-12-08 NOTE — Progress Notes (Signed)
Office Visit Note   Patient: Jeffrey Ayala           Date of Birth: [birth year]           MRN: [id number] Visit Date: 12/08/2018              Requested by: No referring provider defined for this encounter. PCP: Patient, No Pcp Per  Chief Complaint  Patient presents with  . Right Knee - Routine Post Op  . Left Knee - Routine Post Op      HPI: The patient is a 58 year old gentleman who is seen for postoperative follow-up following bilateral total knee replacements on 11/04/2018 for osteoarthritis.  He was seen last week and started on doxycycline for some mild erythema of the right peri-incisional area greater than the left.  He also has a history of gout.  He still feeling some warmth and stiffness in the knees but overall feels he is improved.  He did take a trip to [place] and was on his feet a great deal but has been doing well with his return to work.  Assessment & Plan: Visit Diagnoses:  1. Status post bilateral knee replacements   2. Idiopathic chronic gout of left ankle without tophus     Plan: Complete course of doxycycline.  Continue quadricep strengthening.  Scar massage with either Shea butter or cocoa butter.  Discussed that massage and strengthening of the quadriceps will be needed for the patient's lifetime.  Continue allopurinol and will need follow-up uric acid level at follow-up in 4 weeks.  Follow-Up Instructions: Return in about 4 weeks (around 01/05/2019).   Ortho Exam  Patient is alert, oriented, no adenopathy, well-dressed, normal affect, normal respiratory effort. Bilateral knee incisions have decreased erythema and overall decreased warmth.  Incisions are clean dry and intact.  There is very mild effusion.  Range of motion is at least 0 to 100 degrees bilaterally.  Quad strength is improving.  Quad mechanism intact.  Imaging: No results found.   Labs: Lab Results  Component Value Date   LABURIC 6.3 11/14/2018   LABURIC 7.4 04/10/2014     Lab  Results  Component Value Date   LABURIC 6.3 11/14/2018   LABURIC 7.4 04/10/2014    Body mass index is 34.01 kg/m.  Orders:  No orders of the defined types were placed in this encounter.  No orders of the defined types were placed in this encounter.    Procedures: No procedures performed  Clinical Data: No additional findings.  ROS:  All other systems negative, except as noted in the HPI. Review of Systems  Objective: Vital Signs: Ht 5\' 10"  (1.778 m)   Wt 237 lb (107.5 kg)   BMI 34.01 kg/m   Specialty Comments:  No specialty comments available.  PMFS History: Patient Active Problem List   Diagnosis Date Noted  . S/P TKR (total knee replacement), bilateral 11/04/2018  . Bilateral primary osteoarthritis of knee   . Rupture of medial head of gastrocnemius, left, sequela 01/14/2017  . Kidney stones 06/22/2016  . HYPERTENSION 08/01/2010  . DYSTHYMIC DISORDER 07/08/2010  . PALPITATIONS 07/08/2010   Past Medical History:  Diagnosis Date  . Concussion   . History of kidney stones   . Migraine     Family History  Problem Relation Age of Onset  . COPD Mother     Past Surgical History:  Procedure Laterality Date  . CYSTOSCOPY    . DENTAL SURGERY    . FINGER SURGERY    .  HERNIA REPAIR     at age 68  . KNEE ARTHROSCOPY Left   . LITHOTRIPSY    . NEPHROLITHOTOMY    . TONSILLECTOMY    . TOTAL KNEE ARTHROPLASTY Bilateral 11/04/2018   Procedure: BILATERAL TOTAL KNEE ARTHROPLASTY;  Surgeon: [name], MD;  Location: [facility];  Service: Orthopedics;  Laterality: Bilateral;  spinal/epidural per anesthesiologist   Social History   Occupational History  . Not on file  Tobacco Use  . Smoking status: Never Smoker  . Smokeless tobacco: Never Used  Substance and Sexual Activity  . Alcohol use: Yes    Comment: occassional  . Drug use: No  . Sexual activity: Not on file

## 2018-12-09 ENCOUNTER — Encounter (INDEPENDENT_AMBULATORY_CARE_PROVIDER_SITE_OTHER): Payer: Self-pay | Admitting: Physician Assistant

## 2018-12-09 DIAGNOSIS — M25561 Pain in right knee: Secondary | ICD-10-CM | POA: Diagnosis not present

## 2018-12-09 DIAGNOSIS — M25662 Stiffness of left knee, not elsewhere classified: Secondary | ICD-10-CM | POA: Diagnosis not present

## 2018-12-09 DIAGNOSIS — M25661 Stiffness of right knee, not elsewhere classified: Secondary | ICD-10-CM | POA: Diagnosis not present

## 2018-12-09 DIAGNOSIS — M25562 Pain in left knee: Secondary | ICD-10-CM | POA: Diagnosis not present

## 2018-12-12 DIAGNOSIS — M25662 Stiffness of left knee, not elsewhere classified: Secondary | ICD-10-CM | POA: Diagnosis not present

## 2018-12-12 DIAGNOSIS — M25561 Pain in right knee: Secondary | ICD-10-CM | POA: Diagnosis not present

## 2018-12-12 DIAGNOSIS — M25661 Stiffness of right knee, not elsewhere classified: Secondary | ICD-10-CM | POA: Diagnosis not present

## 2018-12-12 DIAGNOSIS — M25562 Pain in left knee: Secondary | ICD-10-CM | POA: Diagnosis not present

## 2018-12-15 ENCOUNTER — Ambulatory Visit (INDEPENDENT_AMBULATORY_CARE_PROVIDER_SITE_OTHER): Payer: BLUE CROSS/BLUE SHIELD | Admitting: Orthopedic Surgery

## 2019-01-05 ENCOUNTER — Ambulatory Visit (INDEPENDENT_AMBULATORY_CARE_PROVIDER_SITE_OTHER): Payer: BLUE CROSS/BLUE SHIELD | Admitting: Orthopedic Surgery

## 2019-01-17 ENCOUNTER — Telehealth (INDEPENDENT_AMBULATORY_CARE_PROVIDER_SITE_OTHER): Payer: Self-pay | Admitting: Orthopedic Surgery

## 2019-01-17 NOTE — Telephone Encounter (Signed)
Jeffrey Ayala fron [facility] called left voicemail message concerning monovisc auth 88mg . The case number is [id number]. The number to contact Jeffrey Ayala is [phone]

## 2019-01-18 ENCOUNTER — Telehealth (INDEPENDENT_AMBULATORY_CARE_PROVIDER_SITE_OTHER): Payer: Self-pay | Admitting: Orthopedic Surgery

## 2019-01-18 ENCOUNTER — Telehealth (INDEPENDENT_AMBULATORY_CARE_PROVIDER_SITE_OTHER): Payer: Self-pay

## 2019-01-18 NOTE — Telephone Encounter (Signed)
Please refer to notes.

## 2019-01-18 NOTE — Telephone Encounter (Signed)
Samantha with [facility] [employer] called to follow up on a fax for a prior authorization for the patients monovisc.  She stated that she has faxed it to [place] several times and that she will fax it again to our other number.  Case #[id number].  CB#[phone].  Thank you.

## 2019-01-18 NOTE — Telephone Encounter (Signed)
I called [facility] to notify them that Dr Sharol Given does not require any injections for Monovisc at this time. Tried to call but could not get through. If Samantha from [facility] return call please inform. Thanks  Pt Case #[id number]

## 2019-01-20 NOTE — Telephone Encounter (Signed)
Talked with representative at [facility] and advised them that no PA is required at this time for Monovisc Injection.

## 2019-04-04 DIAGNOSIS — M47812 Spondylosis without myelopathy or radiculopathy, cervical region: Secondary | ICD-10-CM | POA: Diagnosis not present

## 2019-04-04 DIAGNOSIS — M5030 Other cervical disc degeneration, unspecified cervical region: Secondary | ICD-10-CM | POA: Diagnosis not present

## 2019-04-18 DIAGNOSIS — M47812 Spondylosis without myelopathy or radiculopathy, cervical region: Secondary | ICD-10-CM | POA: Diagnosis not present

## 2019-07-03 DIAGNOSIS — N200 Calculus of kidney: Secondary | ICD-10-CM | POA: Diagnosis not present

## 2019-07-03 DIAGNOSIS — N210 Calculus in bladder: Secondary | ICD-10-CM | POA: Diagnosis not present

## 2019-07-03 DIAGNOSIS — R8271 Bacteriuria: Secondary | ICD-10-CM | POA: Diagnosis not present

## 2019-07-18 DIAGNOSIS — N200 Calculus of kidney: Secondary | ICD-10-CM | POA: Diagnosis not present

## 2019-07-18 DIAGNOSIS — R8271 Bacteriuria: Secondary | ICD-10-CM | POA: Diagnosis not present

## 2019-07-18 DIAGNOSIS — N210 Calculus in bladder: Secondary | ICD-10-CM | POA: Diagnosis not present

## 2019-08-14 DIAGNOSIS — N200 Calculus of kidney: Secondary | ICD-10-CM | POA: Diagnosis not present

## 2019-08-14 DIAGNOSIS — R8271 Bacteriuria: Secondary | ICD-10-CM | POA: Diagnosis not present

## 2019-08-31 DIAGNOSIS — M25775 Osteophyte, left foot: Secondary | ICD-10-CM | POA: Diagnosis not present

## 2019-08-31 DIAGNOSIS — L600 Ingrowing nail: Secondary | ICD-10-CM | POA: Diagnosis not present

## 2019-09-14 ENCOUNTER — Other Ambulatory Visit: Payer: Self-pay

## 2019-09-14 ENCOUNTER — Emergency Department (HOSPITAL_COMMUNITY): Payer: BC Managed Care – PPO

## 2019-09-14 ENCOUNTER — Emergency Department (HOSPITAL_COMMUNITY)
Admission: EM | Admit: 2019-09-14 | Discharge: 2019-09-14 | Disposition: A | Discharge status: Home / Self Care | Payer: BC Managed Care – PPO | Attending: Emergency Medicine | Admitting: Emergency Medicine

## 2019-09-14 ENCOUNTER — Encounter (HOSPITAL_COMMUNITY): Payer: Self-pay | Admitting: *Deleted

## 2019-09-14 DIAGNOSIS — R0789 Other chest pain: Secondary | ICD-10-CM | POA: Diagnosis not present

## 2019-09-14 DIAGNOSIS — R0602 Shortness of breath: Secondary | ICD-10-CM | POA: Diagnosis not present

## 2019-09-14 DIAGNOSIS — R079 Chest pain, unspecified: Secondary | ICD-10-CM | POA: Diagnosis not present

## 2019-09-14 DIAGNOSIS — Z5321 Procedure and treatment not carried out due to patient leaving prior to being seen by health care provider: Secondary | ICD-10-CM | POA: Insufficient documentation

## 2019-09-14 LAB — BASIC METABOLIC PANEL
Anion gap: 13 (ref 5–15)
BUN: 12 mg/dL (ref 6–20)
CO2: 25 mmol/L (ref 22–32)
Calcium: 9.4 mg/dL (ref 8.9–10.3)
Chloride: 102 mmol/L (ref 98–111)
Creatinine, Ser: 1.23 mg/dL (ref 0.61–1.24)
GFR calc Af Amer: >60 mL/min (ref >60)
GFR calc non Af Amer: >60 mL/min (ref >60)
Glucose, Bld: 123 mg/dL — ABNORMAL HIGH (ref 70–99)
Potassium: 3.4 mmol/L — ABNORMAL LOW (ref 3.5–5.1)
Sodium: 140 mmol/L (ref 135–145)

## 2019-09-14 LAB — CBC
HCT: 40.9 % (ref 39.0–52.0)
Hemoglobin: 13.4 g/dL (ref 13.0–17.0)
MCH: 29.5 pg (ref 26.0–34.0)
MCHC: 32.8 g/dL (ref 30.0–36.0)
MCV: 89.9 fL (ref 80.0–100.0)
Platelets: 298 10*3/uL (ref 150–400)
RBC: 4.55 MIL/uL (ref 4.22–5.81)
RDW: 15.6 % — ABNORMAL HIGH (ref 11.5–15.5)
WBC: 7.4 10*3/uL (ref 4.0–10.5)
nRBC: 0 % (ref 0.0–0.2)

## 2019-09-14 LAB — TROPONIN I (HIGH SENSITIVITY): Troponin I (High Sensitivity): 5 ng/L (ref <18)

## 2019-09-14 MED ORDER — SODIUM CHLORIDE 0.9% FLUSH: 3.0000 mL | Freq: Once | INTRAVENOUS | Status: DC

## 2019-09-14 NOTE — ED Triage Notes (Signed)
Pt states that he began having chest pain at 1:30pm.  Pt states that he had some sob and nausea with this.  Pt arrived clutching his his chest, brought back to triage immediately.

## 2019-09-14 NOTE — ED Notes (Signed)
Pt walked out and told staff that he was leaving.

## 2019-12-21 DIAGNOSIS — M47812 Spondylosis without myelopathy or radiculopathy, cervical region: Secondary | ICD-10-CM | POA: Diagnosis not present

## 2019-12-22 IMAGING — DX DG CHEST 2V
2 series · 2 of 2 positions shown · non-contrast
Comparison: 06/11/2010

CLINICAL DATA: Chest pain and shortness of breath.

EXAM:
CHEST - 2 VIEW

[w chest pa]
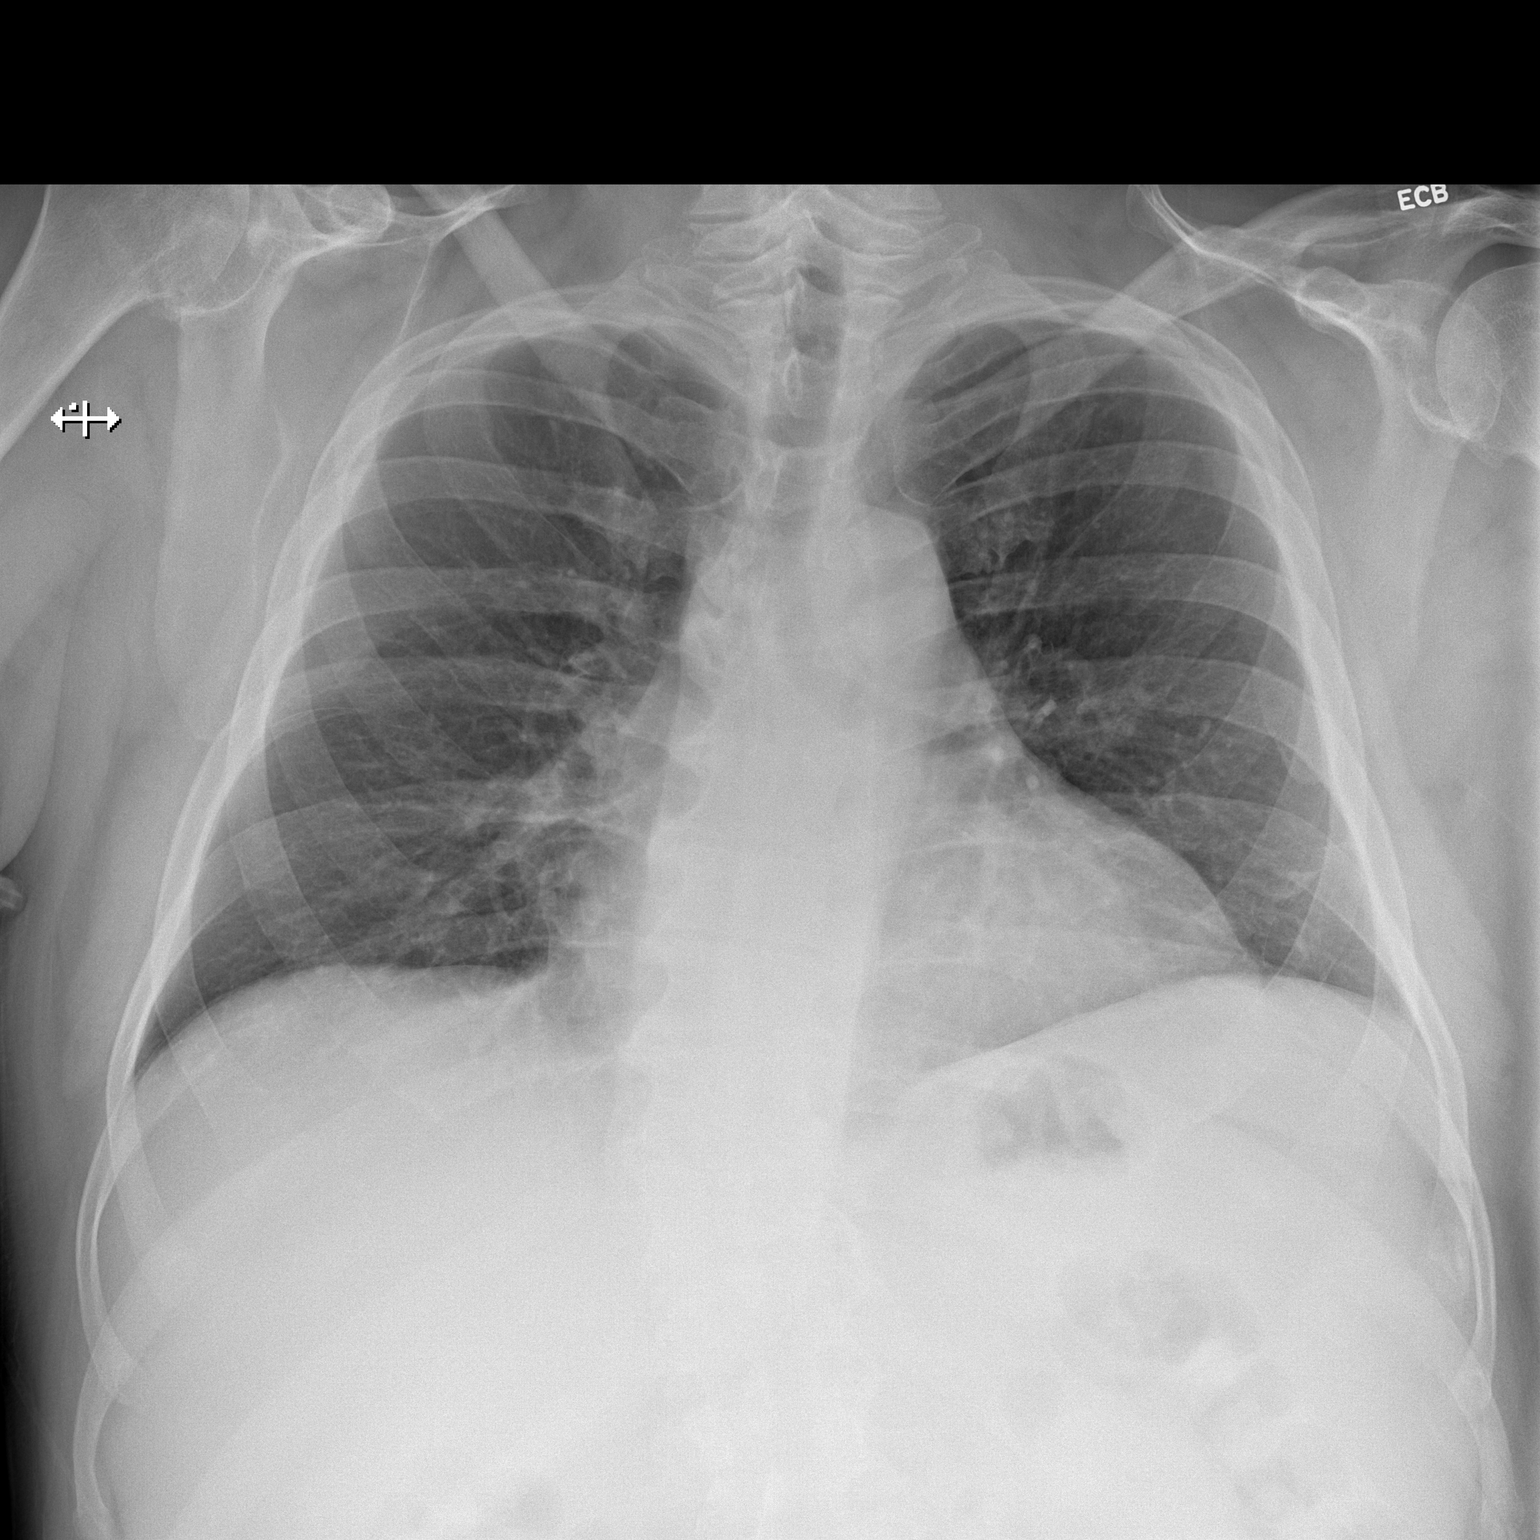

[w chest lat]
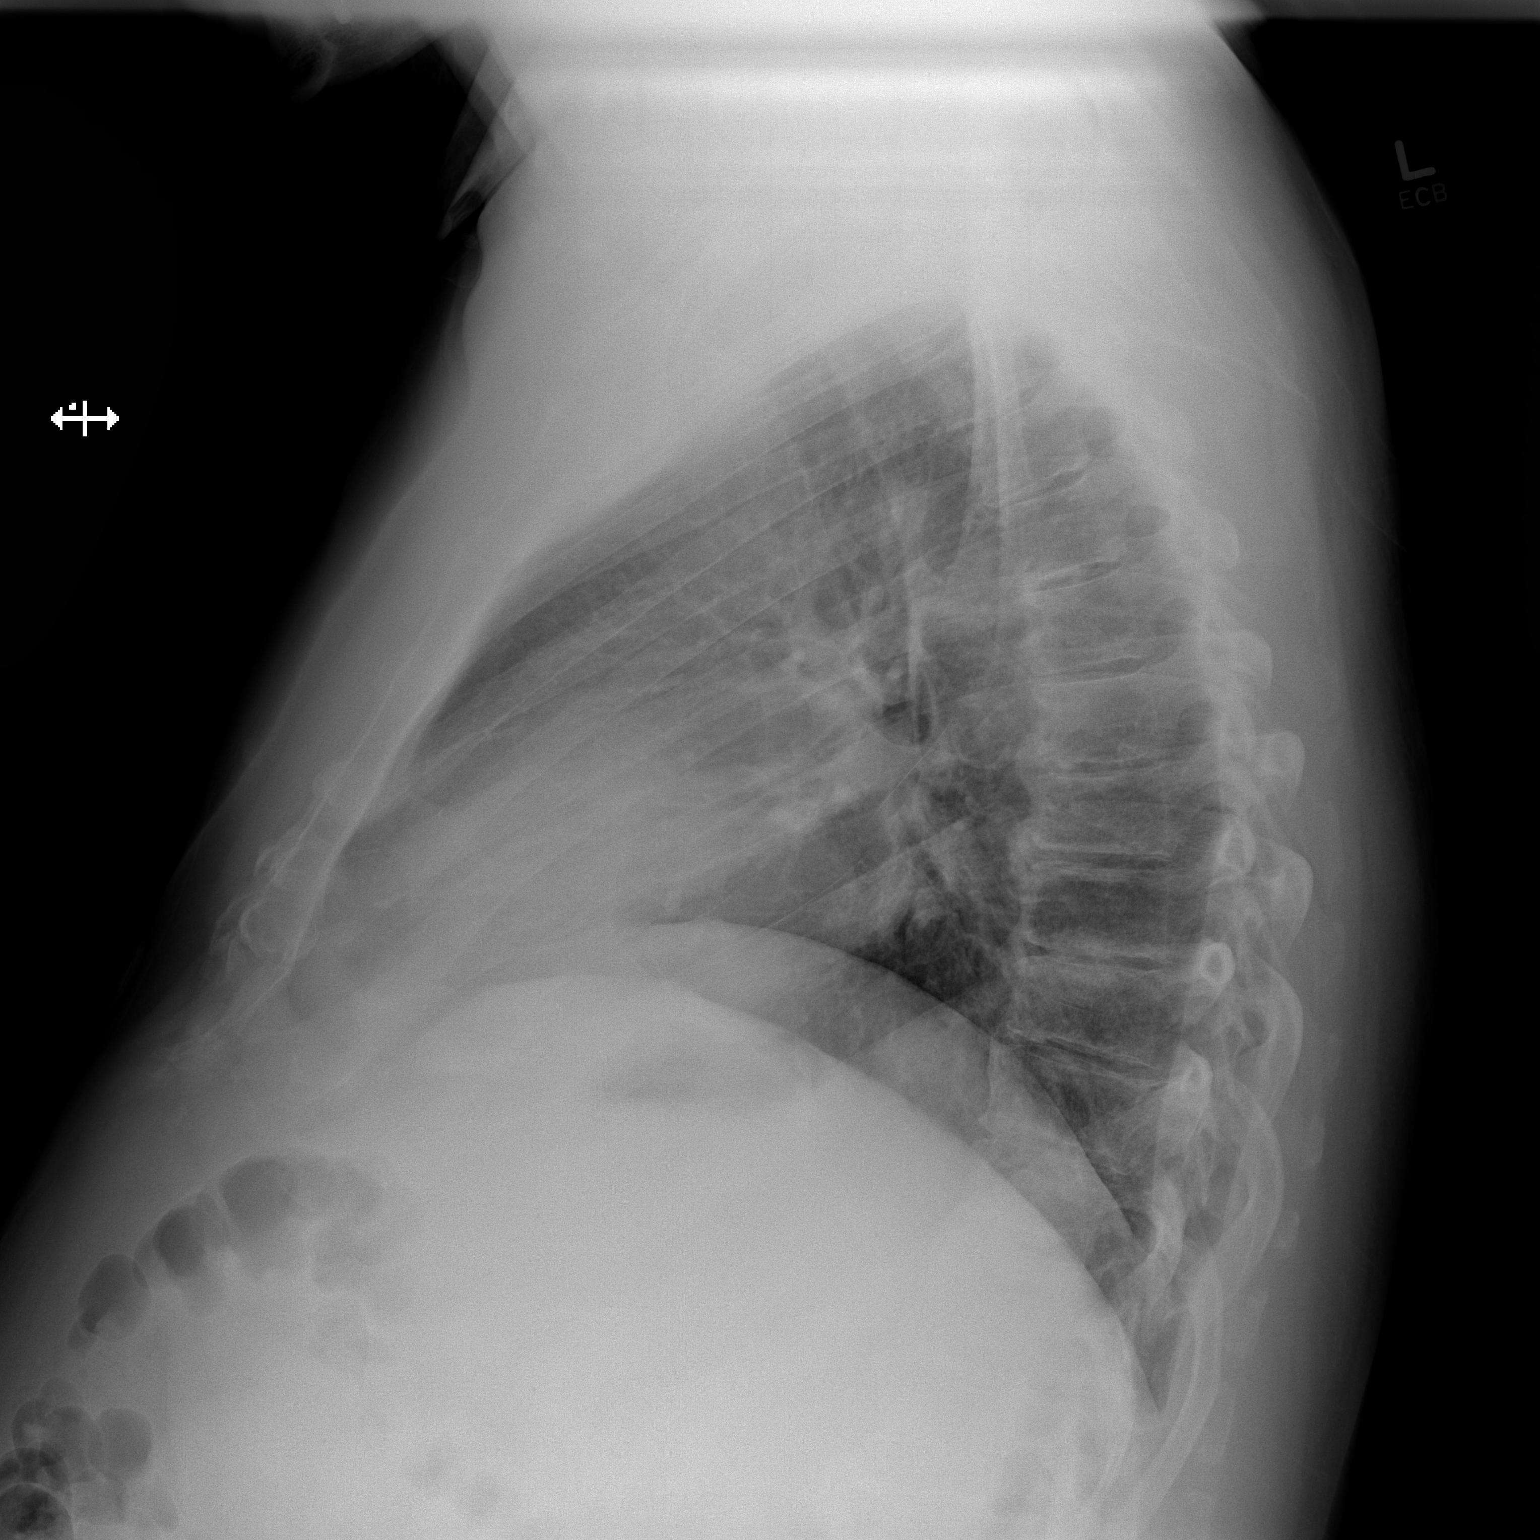

[2 of 2 positions shown; findings below may reference images not displayed]

FINDINGS: The heart size and mediastinal contours are within normal limits.
Both lungs are clear. The visualized skeletal structures are
unremarkable.
IMPRESSION: No active cardiopulmonary disease.

## 2019-12-27 ENCOUNTER — Encounter: Payer: Self-pay | Admitting: Physician Assistant

## 2019-12-27 ENCOUNTER — Ambulatory Visit: Payer: BC Managed Care – PPO | Admitting: Physician Assistant

## 2019-12-27 VITALS — BP 140/86 | HR 84 | Temp 97.4°F | Ht 70.0 in | Wt 237.4 lb

## 2019-12-27 DIAGNOSIS — Z01818 Encounter for other preprocedural examination: Secondary | ICD-10-CM

## 2019-12-27 DIAGNOSIS — K219 Gastro-esophageal reflux disease without esophagitis: Secondary | ICD-10-CM

## 2019-12-27 DIAGNOSIS — K9289 Other specified diseases of the digestive system: Secondary | ICD-10-CM | POA: Diagnosis not present

## 2019-12-27 DIAGNOSIS — R066 Hiccough: Secondary | ICD-10-CM | POA: Diagnosis not present

## 2019-12-27 MED ORDER — PANTOPRAZOLE SODIUM 40 MG PO TBEC: DELAYED_RELEASE_TABLET | ORAL | 0 refills | Status: DC

## 2019-12-27 MED ORDER — BACLOFEN 10 MG PO TABS: 10.0000 mg | ORAL_TABLET | Freq: Three times a day (TID) | ORAL | 0 refills | Status: DC

## 2019-12-27 NOTE — Patient Instructions (Addendum)
If you are age 59 or older, your body mass index should be between 23-30. Your Body mass index is 34.06 kg/m. If this is out of the aforementioned range listed, please consider follow up with your Primary Care Provider.  If you are age 98 or younger, your body mass index should be between 19-25. Your Body mass index is 34.06 kg/m. If this is out of the aformentioned range listed, please consider follow up with your Primary Care Provider.   You have been scheduled for an endoscopy. Please follow written instructions given to you at your visit today. If you use inhalers (even only as needed), please bring them with you on the day of your procedure. We have sent the following medications to your pharmacy for you to pick up at your convenience:  Due to recent changes in healthcare laws, you may see the results of your imaging and laboratory studies on MyChart before your provider has had a chance to review them.  We understand that in some cases there may be results that are confusing or concerning to you. Not all laboratory results come back in the same time frame and the provider may be waiting for multiple results in order to interpret others.  Please give [place] 48 hours in order for your provider to thoroughly review all the results before contacting the office for clarification of your results.

## 2019-12-27 NOTE — Progress Notes (Signed)
Agree with assessment and plan as outlined. Will see if PPI and baclofen may help break the cycle. Unclear if EGD will show a clear cause but may be reasonable if symptoms persist / refractory.

## 2019-12-27 NOTE — Progress Notes (Signed)
Subjective:    Patient ID: Jeffrey Ayala, male    DOB: January 25, 1961, 59 y.o.   MRN: [id number]  HPI Jeffrey Ayala is a pleasant 59 year old white male, new to GI today, self-referred for evaluation of severe hiccups which have been present over the past 5 days.  This is been associated with episodes of acid reflux and regurgitation. Patient does not have history of chronic GERD, no prior EGD.  He was seen here very remotely in 1990 by Dr. Jarold Motto and had sigmoidoscopy which was negative. Patient says he was feeling fine until last Friday when he woke up with hiccups which have persisted ever since.  He says he has been having very difficult time sleeping.  Since the hiccups started he has been feeling more gassy and bloated and has had some episodes of reflux and regurgitation.  He denies any dysphagia or odynophagia.  No nausea or vomiting and denies any abdominal pain.  He has been able to eat though is eating less.  He tried Gaviscon without any change in symptoms and OTC Prilosec without any benefit. No recent changes in diet meds etc.  Patient mentions that he does have history of ureteral lithiasis and believes that he has some kidney stones in his kidneys currently.  He said 1 time several years ago he had some issues with pickups and wound up passing a kidney stone within a couple of days.  However today he has no complaints of back discomfort dysuria hematuria etc.  Review of Systems Pertinent positive and negative review of systems were noted in the above HPI section.  All other review of systems was otherwise negative.  Outpatient Encounter Medications as of 12/27/2019  Medication Sig  . acetaminophen (TYLENOL) 650 MG CR tablet Take 1,300 mg by mouth daily.  Marland Kitchen allopurinol (ZYLOPRIM) 100 MG tablet Take 1 tablet (100 mg total) by mouth daily.  . colchicine-probenecid 0.5-500 MG tablet Take 1 tablet by mouth 2 (two) times daily.  Marland Kitchen glucosamine-chondroitin 500-400 MG tablet Take 1 tablet by  mouth daily.  . TURMERIC CURCUMIN PO Take 1 capsule by mouth daily.  . baclofen (LIORESAL) 10 MG tablet Take 1 tablet (10 mg total) by mouth 3 (three) times daily.  . pantoprazole (PROTONIX) 40 MG tablet Take 40 mg twice a day for 14 days, then 1 40mg  tablet daily thereafter  . [DISCONTINUED] doxycycline (VIBRAMYCIN) 100 MG capsule Take 1 capsule (100 mg total) by mouth 2 (two) times daily. (Patient not taking: Reported on 12/27/2019)  . [DISCONTINUED] naproxen sodium (ALEVE) 220 MG tablet Take 440 mg by mouth daily.  . [DISCONTINUED] oxyCODONE-acetaminophen (PERCOCET/ROXICET) 5-325 MG tablet Take 1 tablet by mouth every 4 (four) hours as needed. (Patient not taking: Reported on 12/27/2019)  . [DISCONTINUED] traMADol (ULTRAM) 50 MG tablet Take 1 tablet (50 mg total) by mouth every 6 (six) hours as needed for moderate pain or severe pain. (Patient not taking: Reported on 12/27/2019)   No facility-administered encounter medications on file as of 12/27/2019.   Allergies  Allergen Reactions  . Codeine Hives    Insomnia, jittery  . Flomax [Tamsulosin Hcl] Nausea Only    Nausea and fatigue   Patient Active Problem List   Diagnosis Date Noted  . S/P TKR (total knee replacement), bilateral 11/04/2018  . Bilateral primary osteoarthritis of knee   . Rupture of medial head of gastrocnemius, left, sequela 01/14/2017  . Kidney stones 06/22/2016  . HYPERTENSION 08/01/2010  . DYSTHYMIC DISORDER 07/08/2010  . PALPITATIONS 07/08/2010  Social History   Socioeconomic History  . Marital status: Married    Spouse name: Not on file  . Number of children: Not on file  . Years of education: Not on file  . Highest education level: Not on file  Occupational History  . Not on file  Tobacco Use  . Smoking status: Never Smoker  . Smokeless tobacco: Never Used  Substance and Sexual Activity  . Alcohol use: Yes    Comment: occassional  . Drug use: No  . Sexual activity: Not on file  Other Topics Concern  .  Not on file  Social History Narrative  . Not on file   Social Determinants of Health   Financial Resource Strain:   . Difficulty of Paying Living Expenses: Not on file  Food Insecurity:   . Worried About Programme researcher, broadcasting/film/video in the Last Year: Not on file  . Ran Out of Food in the Last Year: Not on file  Transportation Needs:   . Lack of Transportation (Medical): Not on file  . Lack of Transportation (Non-Medical): Not on file  Physical Activity:   . Days of Exercise per Week: Not on file  . Minutes of Exercise per Session: Not on file  Stress:   . Feeling of Stress : Not on file  Social Connections:   . Frequency of Communication with Friends and Family: Not on file  . Frequency of Social Gatherings with Friends and Family: Not on file  . Attends Religious Services: Not on file  . Active Member of Clubs or Organizations: Not on file  . Attends Banker Meetings: Not on file  . Marital Status: Not on file  Intimate Partner Violence:   . Fear of Current or Ex-Partner: Not on file  . Emotionally Abused: Not on file  . Physically Abused: Not on file  . Sexually Abused: Not on file    Jeffrey Ayala's family history includes COPD in his mother; Cancer in his brother; Sarcoidosis in his brother.      Objective:    Vitals:   12/27/19 1010  BP: 140/86  Pulse: 84  Temp: (!) 97.4 F (36.3 C)  SpO2: 99%    Physical Exam Well-developed well-nourished older W /male in no acute distress.  Height, [id number], BMI 34,06  HEENT; nontraumatic normocephalic, EOMI, PE RR LA, sclera anicteric. Oropharynx; not examined Neck; supple, no JVD Cardiovascular; regular rate and rhythm with S1-S2, no murmur rub or gallop Pulmonary; Clear bilaterally Abdomen; soft, nontender, nondistended, no palpable mass or hepatosplenomegaly, bowel sounds are active, no succussion splash Rectal; not done today Skin; benign exam, no jaundice rash or appreciable lesions Extremities; no clubbing  cyanosis or edema skin warm and dry Neuro/Psych; alert and oriented x4, grossly nonfocal mood and affect appropriate       Assessment & Plan:   #32 59 year old white male with new onset of intractable hiccups x 6 days.  This is been associated with gassiness, some acid reflux symptoms and regurgitation without dysphagia or odynophagia or abdominal pain.  Etiology is not clear, concerned he may have acute esophagitis, rule out esophageal or gastric neoplasm, rule out other intra-abdominal inflammatory process with diaphragmatic irritation.  #2 history of nephrolithiasis  Plan; start pantoprazole 40 mg p.o. twice daily x2 weeks then daily thereafter. Start baclofen 10 mg p.o. 3 times daily Patient will be scheduled for upper endoscopy with Dr. Adela Lank within the next week.  Procedure was discussed in detail with the patient including indications risks  and benefits and he is agreeable to proceed. Further recommendations pending findings at EGD.  Jeffrey Denis S Rocklin Soderquist PA-C 12/27/2019   Cc: No ref. provider found

## 2019-12-29 ENCOUNTER — Encounter: Payer: Self-pay | Admitting: Gastroenterology

## 2019-12-29 ENCOUNTER — Ambulatory Visit (INDEPENDENT_AMBULATORY_CARE_PROVIDER_SITE_OTHER): Payer: BC Managed Care – PPO

## 2019-12-29 DIAGNOSIS — Z1159 Encounter for screening for other viral diseases: Secondary | ICD-10-CM

## 2019-12-30 LAB — SARS CORONAVIRUS 2 (TAT 6-24 HRS): SARS Coronavirus 2: NEGATIVE

## 2020-01-02 ENCOUNTER — Ambulatory Visit (AMBULATORY_SURGERY_CENTER): Payer: BC Managed Care – PPO | Admitting: Gastroenterology

## 2020-01-02 ENCOUNTER — Encounter: Payer: Self-pay | Admitting: Gastroenterology

## 2020-01-02 ENCOUNTER — Other Ambulatory Visit: Payer: Self-pay

## 2020-01-02 VITALS — BP 112/76 | HR 74 | Temp 97.8°F | Resp 13 | Ht 70.0 in | Wt 237.0 lb

## 2020-01-02 DIAGNOSIS — K259 Gastric ulcer, unspecified as acute or chronic, without hemorrhage or perforation: Secondary | ICD-10-CM

## 2020-01-02 DIAGNOSIS — R4702 Dysphasia: Secondary | ICD-10-CM

## 2020-01-02 DIAGNOSIS — K219 Gastro-esophageal reflux disease without esophagitis: Secondary | ICD-10-CM | POA: Diagnosis not present

## 2020-01-02 DIAGNOSIS — K2980 Duodenitis without bleeding: Secondary | ICD-10-CM | POA: Diagnosis not present

## 2020-01-02 DIAGNOSIS — K2970 Gastritis, unspecified, without bleeding: Secondary | ICD-10-CM

## 2020-01-02 DIAGNOSIS — R066 Hiccough: Secondary | ICD-10-CM

## 2020-01-02 DIAGNOSIS — K2950 Unspecified chronic gastritis without bleeding: Secondary | ICD-10-CM | POA: Diagnosis not present

## 2020-01-02 MED ORDER — SODIUM CHLORIDE 0.9 % IV SOLN: 500.0000 mL | Freq: Once | INTRAVENOUS | Status: DC

## 2020-01-02 NOTE — Patient Instructions (Signed)
YOU HAD AN ENDOSCOPIC PROCEDURE TODAY AT THE [facility]:   Refer to the procedure report that was given to you for any specific questions about what was found during the examination.  If the procedure report does not answer your questions, please call your gastroenterologist to clarify.  If you requested that your care partner not be given the details of your procedure findings, then the procedure report has been included in a sealed envelope for you to review at your convenience later.  YOU SHOULD EXPECT: Some feelings of bloating in the abdomen. Passage of more gas than usual.  Walking can help get rid of the air that was put into your GI tract during the procedure and reduce the bloating. If you had a lower endoscopy (such as a colonoscopy or flexible sigmoidoscopy) you may notice spotting of blood in your stool or on the toilet paper. If you underwent a bowel prep for your procedure, you may not have a normal bowel movement for a few days.  Please Note:  You might notice some irritation and congestion in your nose or some drainage.  This is from the oxygen used during your procedure.  There is no need for concern and it should clear up in a day or so.  SYMPTOMS TO REPORT IMMEDIATELY:   Following upper endoscopy (EGD)  Vomiting of blood or coffee ground material  New chest pain or pain under the shoulder blades  Painful or persistently difficult swallowing  New shortness of breath  Fever of 100F or higher  Black, tarry-looking stools  For urgent or emergent issues, a gastroenterologist can be reached at any hour by calling [phone].   DIET:  We do recommend a small meal at first, but then you may proceed to your regular diet.  Drink plenty of fluids but you should avoid alcoholic beverages for 24 hours.  ACTIVITY:  You should plan to take it easy for the rest of today and you should NOT DRIVE or use heavy machinery until tomorrow (because of the sedation medicines used  during the test).    FOLLOW UP: Our staff will call the number listed on your records 48-72 hours following your procedure to check on you and address any questions or concerns that you may have regarding the information given to you following your procedure. If we do not reach you, we will leave a message.  We will attempt to reach you two times.  During this call, we will ask if you have developed any symptoms of COVID 19. If you develop any symptoms (ie: fever, flu-like symptoms, shortness of breath, cough etc.) before then, please call [phone].  If you test positive for Covid 19 in the 2 weeks post procedure, please call and report this information to us.    If any biopsies were taken you will be contacted by phone or by letter within the next 1-3 weeks.  Please call us at [phone] if you have not heard about the biopsies in 3 weeks.    SIGNATURES/CONFIDENTIALITY: You and/or your care partner have signed paperwork which will be entered into your electronic medical record.  These signatures attest to the fact that that the information above on your After Visit Summary has been reviewed and is understood.  Full responsibility of the confidentiality of this discharge information lies with you and/or your care-partner. 

## 2020-01-02 NOTE — Op Note (Signed)
[facility] Patient Name: Jeffrey Ayala Procedure Date: 01/02/2020 3:50 PM MRN: [id number] Endoscopist: Viviann Spare P. Adela Lank , MD Age: 59 Referring MD:  Date of Birth: April 28, 1961 Gender: Male Account #: [id number] Procedure:                Upper GI endoscopy Indications:              Intractable hiccups, recent symptoms of                            gastro-esophageal reflux disease, patient also                            endorses rare dysphagia - symptoms improved with                            protonix twice daily and baclofen as needed Medicines:                Monitored Anesthesia Care Procedure:                Pre-Anesthesia Assessment:                           - Prior to the procedure, a History and Physical                            was performed, and patient medications and                            allergies were reviewed. The patient's tolerance of                            previous anesthesia was also reviewed. The risks                            and benefits of the procedure and the sedation                            options and risks were discussed with the patient.                            All questions were answered, and informed consent                            was obtained. Prior Anticoagulants: The patient has                            taken no previous anticoagulant or antiplatelet                            agents. ASA Grade Assessment: II - A patient with                            mild systemic disease. After reviewing the risks  and benefits, the patient was deemed in                            satisfactory condition to undergo the procedure.                           After obtaining informed consent, the endoscope was                            passed under direct vision. Throughout the                            procedure, the patient's blood pressure, pulse, and                            oxygen saturations were  monitored continuously. The                            Endoscope was introduced through the mouth, and                            advanced to the second part of duodenum. The upper                            GI endoscopy was accomplished without difficulty.                            The patient tolerated the procedure well. Scope In: Scope Out: Findings:                 Esophagogastric landmarks were identified: the                            Z-line was found at 40 cm, the gastroesophageal                            junction was found at 40 cm and the upper extent of                            the gastric folds was found at 40 cm from the                            incisors.                           Mucosal changes including mild ringed esophagus and                            longitudinal furrows were found in the middle third                            of the esophagus and in the lower third of the  esophagus. Biopsies were obtained from the proximal                            and distal esophagus with cold forceps for                            histology for possible underlying eosinophilic                            esophagitis.                           The exam of the esophagus was otherwise normal. No                            obvious stenosis / stricture noted.                           One cratered gastric ulcer was found at the                            pylorus. The lesion was 3 mm in largest dimension,                            associated with significant edema / inflammatory                            changes. Biopsies were taken with a cold forceps                            for histology from the periphery of it.                           Patchy moderate inflammation characterized by                            erythema, friability and granularity was found in                            the gastric antrum.                           The exam of  the stomach was otherwise normal.                           Biopsies were taken with a cold forceps in the                            gastric body, at the incisura and in the gastric                            antrum for Helicobacter pylori testing.                           Diffuse  moderate inflammation characterized by                            erythema was found in the distal duodenal bulb,                            with edema traversing this area into the sweep.                            Biopsies were taken with a cold forceps for                            histology.                           The exam of the duodenum was otherwise normal. Complications:            No immediate complications. Estimated blood loss:                            Minimal. Estimated Blood Loss:     Estimated blood loss was minimal. Impression:               - Esophagogastric landmarks identified.                           - Esophageal mucosal changes raise question of                            possible underlying eosinophilic esophagitis.                            Biopsied.                           - Normal esophagus otherwise                           - Small gastric ulcer. Biopsied.                           - Gastritis.                           - Normal stomach otherwise - biopsies taken to rule                            out H pylori                           - Duodenitis. Biopsied. Recommendation:           - Patient has a contact number available for                            emergencies. The signs and symptoms of potential  delayed complications were discussed with the                            patient. Return to normal activities tomorrow.                            Written discharge instructions were provided to the                            patient.                           - Resume previous diet.                           - Continue present medications.                            - Await pathology results.                           - Avoid NSAIDs Willaim Rayas. Azekiel Cremer, MD 01/02/2020 4:33:00 PM This report has been signed electronically.

## 2020-01-02 NOTE — Progress Notes (Signed)
Pt's states no medical or surgical changes since previsit or office visit.  LC - temp DT - vitals

## 2020-01-02 NOTE — Progress Notes (Signed)
Report to PACU, RN, vss, BBS= Clear.  

## 2020-01-04 ENCOUNTER — Telehealth: Payer: Self-pay | Admitting: *Deleted

## 2020-01-04 NOTE — Telephone Encounter (Signed)
  Follow up Call-  Call back number 01/02/2020  Post procedure Call Back phone  # [phone]  Permission to leave phone message Yes  Some recent data might be hidden     Patient questions:  Do you have a fever, pain , or abdominal swelling? No. Pain Score  0 *  Have you tolerated food without any problems? Yes.    Have you been able to return to your normal activities? Yes.    Do you have any questions about your discharge instructions: Diet   No. Medications  No. Follow up visit  No.  Do you have questions or concerns about your Care? No.  Actions: * If pain score is 4 or above: No action needed, pain <4.  1. Have you developed a fever since your procedure? no  2.   Have you had an respiratory symptoms (SOB or cough) since your procedure? no  3.   Have you tested positive for COVID 19 since your procedure no  4.   Have you had any family members/close contacts diagnosed with the COVID 19 since your procedure?  no   If yes to any of these questions please route to Joylene John, RN and Alphonsa Gin, Therapist, sports.

## 2020-01-04 NOTE — Telephone Encounter (Signed)
No answer for post procedure call back. Left message for patient to call with questions or concerns. 

## 2020-01-12 DIAGNOSIS — M546 Pain in thoracic spine: Secondary | ICD-10-CM | POA: Diagnosis not present

## 2020-01-12 DIAGNOSIS — M542 Cervicalgia: Secondary | ICD-10-CM | POA: Diagnosis not present

## 2020-01-12 DIAGNOSIS — M25511 Pain in right shoulder: Secondary | ICD-10-CM | POA: Diagnosis not present

## 2020-01-18 ENCOUNTER — Other Ambulatory Visit: Payer: Self-pay | Admitting: Physician Assistant

## 2020-01-18 NOTE — Telephone Encounter (Signed)
Please advise on refill requests. Last seen 12-27-2019. he's scheduled for a follow up with Dr Havery Moros on 02-12-20

## 2020-01-19 NOTE — Telephone Encounter (Signed)
Okay to refill Protonix, x6, can refill baclofen but make for as needed use with 2 refills

## 2020-02-12 ENCOUNTER — Other Ambulatory Visit: Payer: Self-pay

## 2020-02-12 ENCOUNTER — Ambulatory Visit: Payer: BC Managed Care – PPO | Admitting: Gastroenterology

## 2020-02-12 ENCOUNTER — Encounter: Payer: Self-pay | Admitting: Gastroenterology

## 2020-02-12 VITALS — BP 166/92 | HR 88 | Temp 98.7°F | Ht 70.0 in | Wt 243.0 lb

## 2020-02-12 DIAGNOSIS — Z1211 Encounter for screening for malignant neoplasm of colon: Secondary | ICD-10-CM | POA: Diagnosis not present

## 2020-02-12 DIAGNOSIS — Z8719 Personal history of other diseases of the digestive system: Secondary | ICD-10-CM | POA: Diagnosis not present

## 2020-02-12 DIAGNOSIS — K200 Eosinophilic esophagitis: Secondary | ICD-10-CM

## 2020-02-12 DIAGNOSIS — Z8711 Personal history of peptic ulcer disease: Secondary | ICD-10-CM

## 2020-02-12 MED ORDER — PANTOPRAZOLE SODIUM 40 MG PO TBEC: 40.0000 mg | DELAYED_RELEASE_TABLET | Freq: Two times a day (BID) | ORAL | 2 refills | Status: DC

## 2020-02-12 MED ORDER — FLUTICASONE PROPIONATE HFA 220 MCG/ACT IN AERO: 2.0000 | INHALATION_SPRAY | Freq: Two times a day (BID) | RESPIRATORY_TRACT | 12 refills | Status: DC

## 2020-02-12 NOTE — Progress Notes (Signed)
HPI :  59 year old male here for follow-up visit for dysphagia, recent EGD findings, history of hiccups.  The patient recently had an EGD in February for refractory hiccups and upper tract complaints.  Remarkable findings include small ulcer at the pylorus along with gastritis and duodenitis.  Biopsies negative for H. pylori.  He was also noted to have gross endoscopic findings concerning for eosinophilic esophagitis without obvious stenosis stricture.  Biopsies showed greater than 20 eos per high-powered field.  He was placed on the Protonix 40 mg once a day for the ulcer and gastritis.  He states he had been using NSAIDs such as Advil routinely for joint pains at the time of his endoscopy.  Since that time he has been holding off on NSAIDs and using Tylenol only.  He has used some baclofen as needed for the hiccups but generally states the hiccups have stopped and he is feeling much better on the regimen.  He does endorse having intermittent dysphagia that is been going on for a while.  He states he can have solid food dysphagia about once or twice a month.  It almost always happens when he is in a restaurant.  Has had times where he could not push it down with water and vomits out contents.  He is never had an impaction.  He denies any pyrosis at all.  Denies any known food allergies.  Otherwise I see a report of a flexible sigmoidoscopy in 1990 which was normal.  He has never had a full colonoscopy or exam since that time.  Denies any problems with his bowels   EGD 01/02/20 -  - Mucosal changes including mild ringed esophagus and longitudinal furrows were found in the middle third of the esophagus and in the lower third of the esophagus. Biopsies were obtained from the proximal and distal esophagus with cold forceps for histology for possible underlying eosinophilic esophagitis. - The exam of the esophagus was otherwise normal. No obvious stenosis / stricture noted. - One cratered gastric  ulcer was found at the pylorus. The lesion was 3 mm in largest dimension, associated with significant edema / inflammatory changes. Biopsies were taken with a cold forceps for histology from the periphery of it. - Patchy moderate inflammation characterized by erythema, friability and granularity was found in the gastric antrum. - The exam of the stomach was otherwise normal. - Biopsies were taken with a cold forceps in the gastric body, at the incisura and in the gastric antrum for Helicobacter pylori testing. - Diffuse moderate inflammation characterized by erythema was found in the distal duodenal bulb, with edema traversing this area into the sweep. Biopsies were taken with a cold forceps for histology. - The exam of the duodenum was otherwise normal.  Diagnosis 1. Surgical [P], duodenal - DUODENAL MUCOSA WITH CHANGES CONSISTENT WITH PEPTIC INJURY. - NO FEATURES OF SPRUE OR GRANULOMAS. 2. Surgical [P], gastric antrum and gastric body - ANTRAL AND OXYNTIC MUCOSA WITH MILDLY ACTIVE CHRONIC INFLAMMATION AND REACTIVE CHANGES. - WARTHIN-STARRY NEGATIVE FOR HELICOBACTER PYLORI. - NO INTESTINAL METAPLASIA, DYSPLASIA OR CARCINOMA. 3. Surgical [P], esophagus - SQUAMOUS MUCOSA WITH REFLUX CHANGES AND INCREASED EOSINOPHILS. - FOCALLY GREATER THAN 20 PER HIGH POWER FIELD. - NO DYSPLASIA OR CARCINOMA. 4. Surgical [P], gastric pylorus, peripheral ulcer - PYLORIC MUCOSA WITH MILDLY ACTIVE INFLAMMATION, HYPEREMIA AND REACTIVE CHANGES. - NO INTESTINAL METAPLASIA, DYSPLASIA OR CARCINOMA.    Past Medical History:  Diagnosis Date  . Concussion   . Eosinophilic esophagitis   . Gastric  ulcer   . History of kidney stones   . Migraine      Past Surgical History:  Procedure Laterality Date  . CYSTOSCOPY    . DENTAL SURGERY    . FINGER SURGERY    . HERNIA REPAIR     at age 88  . KNEE ARTHROSCOPY Left   . LITHOTRIPSY    . NEPHROLITHOTOMY    . TONSILLECTOMY    . TOTAL KNEE ARTHROPLASTY  Bilateral 11/04/2018   Procedure: BILATERAL TOTAL KNEE ARTHROPLASTY;  Surgeon: [name], MD;  Location: [facility];  Service: Orthopedics;  Laterality: Bilateral;  spinal/epidural per anesthesiologist   Family History  Problem Relation Age of Onset  . COPD Mother   . Sarcoidosis Brother   . Cancer Brother   . Stomach cancer Neg Hx   . Pancreatic cancer Neg Hx   . Esophageal cancer Neg Hx   . Colon cancer Neg Hx   . Rectal cancer Neg Hx    Social History   Tobacco Use  . Smoking status: Never Smoker  . Smokeless tobacco: Never Used  Substance Use Topics  . Alcohol use: Yes    Comment: occassional  . Drug use: No   Current Outpatient Medications  Medication Sig Dispense Refill  . acetaminophen (TYLENOL) 650 MG CR tablet Take 1,300 mg by mouth daily.    Marland Kitchen allopurinol (ZYLOPRIM) 100 MG tablet Take 1 tablet (100 mg total) by mouth daily. 30 tablet 11  . baclofen (LIORESAL) 10 MG tablet Use three times a day AS NEEDED!!! 90 tablet 2  . colchicine-probenecid 0.5-500 MG tablet Take 1 tablet by mouth 2 (two) times daily. 30 tablet 1  . glucosamine-chondroitin 500-400 MG tablet Take 1 tablet by mouth daily.    . pantoprazole (PROTONIX) 40 MG tablet Take 1 tablet (40 mg total) by mouth 2 (two) times daily. 60 tablet 2  . TURMERIC CURCUMIN PO Take 1 capsule by mouth daily.    . fluticasone (FLOVENT HFA) 220 MCG/ACT inhaler Inhale 2 puffs into the lungs in the morning and at bedtime. Do not eat or drink 30 minutes after inhalation 1 Inhaler 12   No current facility-administered medications for this visit.   Allergies  Allergen Reactions  . Codeine Hives    Insomnia, jittery  . Flomax [Tamsulosin Hcl] Nausea Only    Nausea and fatigue     Review of Systems: All systems reviewed and negative except where noted in HPI.   Lab Results  Component Value Date   WBC 7.4 09/14/2019   HGB 13.4 09/14/2019   HCT 40.9 09/14/2019   MCV 89.9 09/14/2019   PLT 298 09/14/2019    Lab  Results  Component Value Date   CREATININE 1.23 09/14/2019   BUN 12 09/14/2019   NA 140 09/14/2019   K 3.4 (L) 09/14/2019   CL 102 09/14/2019   CO2 25 09/14/2019    No results found for: ALT, AST, GGT, ALKPHOS, BILITOT   Physical Exam: BP (!) 166/92   Pulse 88   Temp 98.7 F (37.1 C)   Ht 5\' 10"  (1.778 m)   Wt 243 lb (110.2 kg)   BMI 34.87 kg/m  Constitutional: Pleasant,well-developed, male in no acute distress. Neurological: Alert and oriented to person place and time. Psychiatric: Normal mood and affect. Behavior is normal.   ASSESSMENT AND PLAN: 59 year old male here for reassessment the following:  Eosinophilic esophagitis - he endorses longstanding intermittent solid food dysphagia.  EGD showed no obvious stenosis or  stricture but he does have evidence of eosinophilic esophagitis on gross exam and biopsies.  On PPI at 40 mg Protonix once daily he continues to have symptoms.  We discussed options.  I discussed what EOE is and risks for stricturing and impaction.  I will refer him to an allergist to see if we can identify any clear food triggers for this.  Otherwise given his persistent symptoms despite PPI we will give him some oral Flovent 220 mcg 2 puffs twice daily for 4 weeks and see if that provides any improvement.  I also asked him to increase his Protonix to 40 mg twice a day during this time.  We will await his course on these medications and see with the allergy testing shows.  I like to see him back in a few months for reassessment, call sooner if no improvement in symptoms.  Gastric ulcer -small ulcer noted on last EGD in the setting of NSAID use which is the most likely cause.  Testing negative for H. pylori.  Biopsies of the ulcer were benign.  He is continuing PPI for EOE as above.  I do not feel strongly he warrants a follow-up EGD for this given the small size of the ulcer and the biopsies were benign.  Advised him to continue to avoid NSAIDs and would recommend  Tylenol for aches and pains if he needs something in the future.  Colon cancer screening - he is due for routine colon cancer screening.  Discussed and recommend optical colonoscopy.  He is agreeable to do this at some point time but wishes to complete his evaluation for EOE prior to pursuing this.  He will call back to schedule when he is ready.  [place] Cellar, MD [facility]

## 2020-02-12 NOTE — Patient Instructions (Addendum)
If you are age 59 or older, your body mass index should be between 23-30. Your Body mass index is 34.87 kg/m. If this is out of the aforementioned range listed, please consider follow up with your Primary Care Provider.  If you are age 10 or younger, your body mass index should be between 19-25. Your Body mass index is 34.87 kg/m. If this is out of the aformentioned range listed, please consider follow up with your Primary Care Provider.   We have sent the following medications to your pharmacy for you to pick up at your convenience: Protonix 40 mg: Increase to TWICE a day  Fluticasone 220 mcg/spray:  2 puffs two (2) times daily  We placed a referral to Allergy. It usually takes about 1-2 weeks to process and schedule this referral. If you have not heard from [place] regarding this appointment in 2 weeks please contact our office.  Please follow up with our office in 3 months (June 2021).  We will remind you when it is time to schedule an appointment.  Thank you for entrusting me with your care and for choosing [facility], Dr. [place] Cellar

## 2020-02-13 ENCOUNTER — Other Ambulatory Visit: Payer: Self-pay | Admitting: Physician Assistant

## 2020-02-15 DIAGNOSIS — Z23 Encounter for immunization: Secondary | ICD-10-CM | POA: Diagnosis not present

## 2020-03-08 ENCOUNTER — Other Ambulatory Visit: Payer: Self-pay | Admitting: Physician Assistant

## 2020-03-09 DIAGNOSIS — Z23 Encounter for immunization: Secondary | ICD-10-CM | POA: Diagnosis not present

## 2020-03-11 ENCOUNTER — Ambulatory Visit: Payer: BLUE CROSS/BLUE SHIELD | Admitting: Allergy

## 2020-04-26 DIAGNOSIS — M25511 Pain in right shoulder: Secondary | ICD-10-CM | POA: Insufficient documentation

## 2020-04-26 DIAGNOSIS — G8929 Other chronic pain: Secondary | ICD-10-CM | POA: Insufficient documentation

## 2020-05-07 DIAGNOSIS — M19011 Primary osteoarthritis, right shoulder: Secondary | ICD-10-CM | POA: Diagnosis not present

## 2020-05-07 DIAGNOSIS — M19019 Primary osteoarthritis, unspecified shoulder: Secondary | ICD-10-CM | POA: Insufficient documentation

## 2020-05-07 DIAGNOSIS — M7021 Olecranon bursitis, right elbow: Secondary | ICD-10-CM | POA: Diagnosis not present

## 2020-05-07 DIAGNOSIS — M25511 Pain in right shoulder: Secondary | ICD-10-CM | POA: Diagnosis not present

## 2020-05-07 DIAGNOSIS — M25521 Pain in right elbow: Secondary | ICD-10-CM | POA: Diagnosis not present

## 2020-05-15 DIAGNOSIS — M25511 Pain in right shoulder: Secondary | ICD-10-CM | POA: Diagnosis not present

## 2020-05-17 DIAGNOSIS — M7021 Olecranon bursitis, right elbow: Secondary | ICD-10-CM | POA: Diagnosis not present

## 2020-05-25 ENCOUNTER — Other Ambulatory Visit: Payer: Self-pay | Admitting: Physician Assistant

## 2020-05-28 NOTE — Telephone Encounter (Signed)
Ok to continue to refill baclofen? Pt last seen in March.  Thanks

## 2020-05-29 NOTE — Telephone Encounter (Signed)
Okay to refill it he needs this periodically for hiccups however when I last saw him he was not using it, states he didn't need it. Not sure if the pharmacy is asking for a refill or he is calling. Thanks

## 2020-06-05 DIAGNOSIS — M7021 Olecranon bursitis, right elbow: Secondary | ICD-10-CM | POA: Diagnosis not present

## 2020-06-13 DIAGNOSIS — M47812 Spondylosis without myelopathy or radiculopathy, cervical region: Secondary | ICD-10-CM | POA: Diagnosis not present

## 2020-06-26 ENCOUNTER — Other Ambulatory Visit: Payer: Self-pay | Admitting: Gastroenterology

## 2020-06-26 NOTE — Telephone Encounter (Signed)
Ok to refill baclofen?  Last seen 01-2020.Thank you

## 2020-06-26 NOTE — Telephone Encounter (Signed)
If he finds it help, can refill it it, I think he was taking it for hiccups which should not really be a chronic issue. Is the patient requesting this or the pharmacy? Thanks

## 2020-06-27 NOTE — Telephone Encounter (Signed)
Called and spoke to patient.  He would like a refill to have them on hand but he is doing better with the hiccups.

## 2020-06-30 ENCOUNTER — Other Ambulatory Visit: Payer: Self-pay | Admitting: Gastroenterology

## 2020-07-10 DIAGNOSIS — M542 Cervicalgia: Secondary | ICD-10-CM | POA: Diagnosis not present

## 2020-07-10 DIAGNOSIS — M25512 Pain in left shoulder: Secondary | ICD-10-CM | POA: Diagnosis not present

## 2020-07-10 DIAGNOSIS — M25521 Pain in right elbow: Secondary | ICD-10-CM | POA: Diagnosis not present

## 2020-07-10 DIAGNOSIS — M25511 Pain in right shoulder: Secondary | ICD-10-CM | POA: Diagnosis not present

## 2020-07-11 DIAGNOSIS — M5030 Other cervical disc degeneration, unspecified cervical region: Secondary | ICD-10-CM | POA: Diagnosis not present

## 2020-07-11 DIAGNOSIS — M25511 Pain in right shoulder: Secondary | ICD-10-CM | POA: Diagnosis not present

## 2020-07-11 DIAGNOSIS — M47812 Spondylosis without myelopathy or radiculopathy, cervical region: Secondary | ICD-10-CM | POA: Diagnosis not present

## 2020-07-24 ENCOUNTER — Other Ambulatory Visit: Payer: Self-pay | Admitting: Gastroenterology

## 2020-08-31 ENCOUNTER — Other Ambulatory Visit: Payer: Self-pay | Admitting: Gastroenterology

## 2020-09-26 ENCOUNTER — Other Ambulatory Visit: Payer: Self-pay | Admitting: Gastroenterology

## 2021-02-14 ENCOUNTER — Other Ambulatory Visit: Payer: Self-pay | Admitting: Gastroenterology

## 2021-07-15 DIAGNOSIS — M25511 Pain in right shoulder: Secondary | ICD-10-CM | POA: Diagnosis not present

## 2021-07-15 DIAGNOSIS — M79641 Pain in right hand: Secondary | ICD-10-CM | POA: Diagnosis not present

## 2021-08-14 DIAGNOSIS — M79644 Pain in right finger(s): Secondary | ICD-10-CM | POA: Insufficient documentation

## 2022-02-21 DIAGNOSIS — I469 Cardiac arrest, cause unspecified: Secondary | ICD-10-CM

## 2022-02-21 DIAGNOSIS — A419 Sepsis, unspecified organism: Secondary | ICD-10-CM

## 2022-02-21 DIAGNOSIS — N492 Inflammatory disorders of scrotum: Secondary | ICD-10-CM

## 2022-02-21 HISTORY — DX: Cardiac arrest, cause unspecified: I46.9

## 2022-02-21 HISTORY — DX: Sepsis, unspecified organism: A41.9

## 2022-02-21 HISTORY — DX: Inflammatory disorders of scrotum: N49.2

## 2022-02-22 ENCOUNTER — Emergency Department (HOSPITAL_COMMUNITY): Payer: BC Managed Care – PPO

## 2022-02-22 ENCOUNTER — Inpatient Hospital Stay (HOSPITAL_COMMUNITY)
Admission: EM | Admit: 2022-02-22 | Discharge: 2022-04-03 | DRG: 239 | Disposition: A | Discharge status: Rehabilitation Facility | Payer: BC Managed Care – PPO | Attending: Internal Medicine | Admitting: Internal Medicine

## 2022-02-22 DIAGNOSIS — I4891 Unspecified atrial fibrillation: Principal | ICD-10-CM

## 2022-02-22 DIAGNOSIS — E46 Unspecified protein-calorie malnutrition: Secondary | ICD-10-CM | POA: Diagnosis not present

## 2022-02-22 DIAGNOSIS — G8918 Other acute postprocedural pain: Secondary | ICD-10-CM | POA: Diagnosis not present

## 2022-02-22 DIAGNOSIS — D649 Anemia, unspecified: Secondary | ICD-10-CM | POA: Diagnosis not present

## 2022-02-22 DIAGNOSIS — Z89511 Acquired absence of right leg below knee: Secondary | ICD-10-CM | POA: Diagnosis not present

## 2022-02-22 DIAGNOSIS — S88111A Complete traumatic amputation at level between knee and ankle, right lower leg, initial encounter: Secondary | ICD-10-CM | POA: Diagnosis not present

## 2022-02-22 DIAGNOSIS — N492 Inflammatory disorders of scrotum: Secondary | ICD-10-CM

## 2022-02-22 DIAGNOSIS — E872 Acidosis, unspecified: Secondary | ICD-10-CM | POA: Diagnosis present

## 2022-02-22 DIAGNOSIS — R188 Other ascites: Secondary | ICD-10-CM | POA: Diagnosis not present

## 2022-02-22 DIAGNOSIS — E1152 Type 2 diabetes mellitus with diabetic peripheral angiopathy with gangrene: Secondary | ICD-10-CM | POA: Diagnosis not present

## 2022-02-22 DIAGNOSIS — D696 Thrombocytopenia, unspecified: Secondary | ICD-10-CM | POA: Diagnosis not present

## 2022-02-22 DIAGNOSIS — R601 Generalized edema: Secondary | ICD-10-CM | POA: Diagnosis not present

## 2022-02-22 DIAGNOSIS — G9341 Metabolic encephalopathy: Secondary | ICD-10-CM | POA: Diagnosis not present

## 2022-02-22 DIAGNOSIS — Z885 Allergy status to narcotic agent status: Secondary | ICD-10-CM

## 2022-02-22 DIAGNOSIS — R4182 Altered mental status, unspecified: Secondary | ICD-10-CM | POA: Diagnosis not present

## 2022-02-22 DIAGNOSIS — I480 Paroxysmal atrial fibrillation: Secondary | ICD-10-CM | POA: Diagnosis not present

## 2022-02-22 DIAGNOSIS — R197 Diarrhea, unspecified: Secondary | ICD-10-CM | POA: Diagnosis not present

## 2022-02-22 DIAGNOSIS — E43 Unspecified severe protein-calorie malnutrition: Secondary | ICD-10-CM | POA: Diagnosis not present

## 2022-02-22 DIAGNOSIS — N433 Hydrocele, unspecified: Secondary | ICD-10-CM | POA: Diagnosis present

## 2022-02-22 DIAGNOSIS — Z781 Physical restraint status: Secondary | ICD-10-CM

## 2022-02-22 DIAGNOSIS — I2489 Other forms of acute ischemic heart disease: Secondary | ICD-10-CM

## 2022-02-22 DIAGNOSIS — M25561 Pain in right knee: Secondary | ICD-10-CM | POA: Diagnosis not present

## 2022-02-22 DIAGNOSIS — N170 Acute kidney failure with tubular necrosis: Secondary | ICD-10-CM | POA: Diagnosis not present

## 2022-02-22 DIAGNOSIS — N5082 Scrotal pain: Secondary | ICD-10-CM | POA: Diagnosis not present

## 2022-02-22 DIAGNOSIS — I70261 Atherosclerosis of native arteries of extremities with gangrene, right leg: Secondary | ICD-10-CM | POA: Diagnosis not present

## 2022-02-22 DIAGNOSIS — D72819 Decreased white blood cell count, unspecified: Secondary | ICD-10-CM | POA: Diagnosis present

## 2022-02-22 DIAGNOSIS — Z20822 Contact with and (suspected) exposure to covid-19: Secondary | ICD-10-CM | POA: Diagnosis not present

## 2022-02-22 DIAGNOSIS — S3022XA Contusion of scrotum and testes, initial encounter: Secondary | ICD-10-CM | POA: Diagnosis not present

## 2022-02-22 DIAGNOSIS — I96 Gangrene, not elsewhere classified: Secondary | ICD-10-CM | POA: Diagnosis not present

## 2022-02-22 DIAGNOSIS — E66813 Obesity, class 3: Secondary | ICD-10-CM

## 2022-02-22 DIAGNOSIS — Z23 Encounter for immunization: Secondary | ICD-10-CM

## 2022-02-22 DIAGNOSIS — E876 Hypokalemia: Secondary | ICD-10-CM | POA: Diagnosis not present

## 2022-02-22 DIAGNOSIS — Z8674 Personal history of sudden cardiac arrest: Secondary | ICD-10-CM | POA: Diagnosis not present

## 2022-02-22 DIAGNOSIS — R34 Anuria and oliguria: Secondary | ICD-10-CM | POA: Diagnosis not present

## 2022-02-22 DIAGNOSIS — Z8711 Personal history of peptic ulcer disease: Secondary | ICD-10-CM

## 2022-02-22 DIAGNOSIS — S91302A Unspecified open wound, left foot, initial encounter: Secondary | ICD-10-CM | POA: Diagnosis present

## 2022-02-22 DIAGNOSIS — Z452 Encounter for adjustment and management of vascular access device: Secondary | ICD-10-CM

## 2022-02-22 DIAGNOSIS — R570 Cardiogenic shock: Secondary | ICD-10-CM | POA: Diagnosis not present

## 2022-02-22 DIAGNOSIS — Z0389 Encounter for observation for other suspected diseases and conditions ruled out: Secondary | ICD-10-CM | POA: Diagnosis not present

## 2022-02-22 DIAGNOSIS — Z888 Allergy status to other drugs, medicaments and biological substances status: Secondary | ICD-10-CM | POA: Diagnosis not present

## 2022-02-22 DIAGNOSIS — Z87828 Personal history of other (healed) physical injury and trauma: Secondary | ICD-10-CM | POA: Diagnosis not present

## 2022-02-22 DIAGNOSIS — D62 Acute posthemorrhagic anemia: Secondary | ICD-10-CM | POA: Diagnosis not present

## 2022-02-22 DIAGNOSIS — I4819 Other persistent atrial fibrillation: Secondary | ICD-10-CM | POA: Diagnosis not present

## 2022-02-22 DIAGNOSIS — M109 Gout, unspecified: Secondary | ICD-10-CM

## 2022-02-22 DIAGNOSIS — R110 Nausea: Secondary | ICD-10-CM | POA: Diagnosis not present

## 2022-02-22 DIAGNOSIS — I2510 Atherosclerotic heart disease of native coronary artery without angina pectoris: Secondary | ICD-10-CM | POA: Diagnosis not present

## 2022-02-22 DIAGNOSIS — L89152 Pressure ulcer of sacral region, stage 2: Secondary | ICD-10-CM | POA: Diagnosis present

## 2022-02-22 DIAGNOSIS — K7460 Unspecified cirrhosis of liver: Secondary | ICD-10-CM

## 2022-02-22 DIAGNOSIS — R0689 Other abnormalities of breathing: Secondary | ICD-10-CM

## 2022-02-22 DIAGNOSIS — I739 Peripheral vascular disease, unspecified: Secondary | ICD-10-CM | POA: Diagnosis not present

## 2022-02-22 DIAGNOSIS — I469 Cardiac arrest, cause unspecified: Secondary | ICD-10-CM | POA: Diagnosis not present

## 2022-02-22 DIAGNOSIS — E1165 Type 2 diabetes mellitus with hyperglycemia: Secondary | ICD-10-CM | POA: Diagnosis present

## 2022-02-22 DIAGNOSIS — K760 Fatty (change of) liver, not elsewhere classified: Secondary | ICD-10-CM | POA: Diagnosis not present

## 2022-02-22 DIAGNOSIS — G43909 Migraine, unspecified, not intractable, without status migrainosus: Secondary | ICD-10-CM | POA: Diagnosis present

## 2022-02-22 DIAGNOSIS — G4733 Obstructive sleep apnea (adult) (pediatric): Secondary | ICD-10-CM | POA: Diagnosis present

## 2022-02-22 DIAGNOSIS — E8729 Other acidosis: Secondary | ICD-10-CM | POA: Diagnosis present

## 2022-02-22 DIAGNOSIS — R6521 Severe sepsis with septic shock: Secondary | ICD-10-CM

## 2022-02-22 DIAGNOSIS — R1084 Generalized abdominal pain: Secondary | ICD-10-CM | POA: Insufficient documentation

## 2022-02-22 DIAGNOSIS — R1310 Dysphagia, unspecified: Secondary | ICD-10-CM | POA: Diagnosis present

## 2022-02-22 DIAGNOSIS — I959 Hypotension, unspecified: Secondary | ICD-10-CM

## 2022-02-22 DIAGNOSIS — N493 Fournier gangrene: Secondary | ICD-10-CM | POA: Diagnosis not present

## 2022-02-22 DIAGNOSIS — Z9911 Dependence on respirator [ventilator] status: Secondary | ICD-10-CM | POA: Diagnosis not present

## 2022-02-22 DIAGNOSIS — F05 Delirium due to known physiological condition: Secondary | ICD-10-CM | POA: Diagnosis present

## 2022-02-22 DIAGNOSIS — J690 Pneumonitis due to inhalation of food and vomit: Secondary | ICD-10-CM | POA: Diagnosis not present

## 2022-02-22 DIAGNOSIS — K591 Functional diarrhea: Secondary | ICD-10-CM | POA: Diagnosis not present

## 2022-02-22 DIAGNOSIS — Z96653 Presence of artificial knee joint, bilateral: Secondary | ICD-10-CM | POA: Diagnosis not present

## 2022-02-22 DIAGNOSIS — R5381 Other malaise: Secondary | ICD-10-CM | POA: Diagnosis not present

## 2022-02-22 DIAGNOSIS — Z992 Dependence on renal dialysis: Secondary | ICD-10-CM | POA: Diagnosis not present

## 2022-02-22 DIAGNOSIS — R8281 Pyuria: Secondary | ICD-10-CM | POA: Diagnosis not present

## 2022-02-22 DIAGNOSIS — Z481 Encounter for planned postprocedural wound closure: Secondary | ICD-10-CM | POA: Diagnosis not present

## 2022-02-22 DIAGNOSIS — B965 Pseudomonas (aeruginosa) (mallei) (pseudomallei) as the cause of diseases classified elsewhere: Secondary | ICD-10-CM | POA: Diagnosis present

## 2022-02-22 DIAGNOSIS — Z1624 Resistance to multiple antibiotics: Secondary | ICD-10-CM | POA: Diagnosis present

## 2022-02-22 DIAGNOSIS — L03115 Cellulitis of right lower limb: Secondary | ICD-10-CM | POA: Diagnosis not present

## 2022-02-22 DIAGNOSIS — N179 Acute kidney failure, unspecified: Secondary | ICD-10-CM | POA: Diagnosis present

## 2022-02-22 DIAGNOSIS — J811 Chronic pulmonary edema: Secondary | ICD-10-CM | POA: Diagnosis not present

## 2022-02-22 DIAGNOSIS — R112 Nausea with vomiting, unspecified: Secondary | ICD-10-CM | POA: Diagnosis not present

## 2022-02-22 DIAGNOSIS — R52 Pain, unspecified: Secondary | ICD-10-CM

## 2022-02-22 DIAGNOSIS — K7200 Acute and subacute hepatic failure without coma: Secondary | ICD-10-CM

## 2022-02-22 DIAGNOSIS — N200 Calculus of kidney: Secondary | ICD-10-CM | POA: Diagnosis not present

## 2022-02-22 DIAGNOSIS — Z79899 Other long term (current) drug therapy: Secondary | ICD-10-CM

## 2022-02-22 DIAGNOSIS — R161 Splenomegaly, not elsewhere classified: Secondary | ICD-10-CM | POA: Diagnosis present

## 2022-02-22 DIAGNOSIS — E669 Obesity, unspecified: Secondary | ICD-10-CM | POA: Diagnosis not present

## 2022-02-22 DIAGNOSIS — J9601 Acute respiratory failure with hypoxia: Secondary | ICD-10-CM

## 2022-02-22 DIAGNOSIS — Z1211 Encounter for screening for malignant neoplasm of colon: Secondary | ICD-10-CM

## 2022-02-22 DIAGNOSIS — I517 Cardiomegaly: Secondary | ICD-10-CM | POA: Diagnosis not present

## 2022-02-22 DIAGNOSIS — I248 Other forms of acute ischemic heart disease: Secondary | ICD-10-CM | POA: Diagnosis present

## 2022-02-22 DIAGNOSIS — A419 Sepsis, unspecified organism: Secondary | ICD-10-CM | POA: Diagnosis not present

## 2022-02-22 DIAGNOSIS — R0902 Hypoxemia: Secondary | ICD-10-CM | POA: Diagnosis not present

## 2022-02-22 DIAGNOSIS — R509 Fever, unspecified: Secondary | ICD-10-CM | POA: Diagnosis not present

## 2022-02-22 DIAGNOSIS — K5730 Diverticulosis of large intestine without perforation or abscess without bleeding: Secondary | ICD-10-CM | POA: Diagnosis not present

## 2022-02-22 DIAGNOSIS — I10 Essential (primary) hypertension: Secondary | ICD-10-CM | POA: Diagnosis not present

## 2022-02-22 DIAGNOSIS — R000 Tachycardia, unspecified: Secondary | ICD-10-CM | POA: Diagnosis not present

## 2022-02-22 DIAGNOSIS — R519 Headache, unspecified: Secondary | ICD-10-CM

## 2022-02-22 DIAGNOSIS — K828 Other specified diseases of gallbladder: Secondary | ICD-10-CM | POA: Diagnosis not present

## 2022-02-22 DIAGNOSIS — D631 Anemia in chronic kidney disease: Secondary | ICD-10-CM | POA: Diagnosis not present

## 2022-02-22 DIAGNOSIS — L8990 Pressure ulcer of unspecified site, unspecified stage: Secondary | ICD-10-CM | POA: Diagnosis not present

## 2022-02-22 DIAGNOSIS — S91301A Unspecified open wound, right foot, initial encounter: Secondary | ICD-10-CM | POA: Diagnosis present

## 2022-02-22 DIAGNOSIS — X58XXXA Exposure to other specified factors, initial encounter: Secondary | ICD-10-CM | POA: Diagnosis present

## 2022-02-22 DIAGNOSIS — I700 Atherosclerosis of aorta: Secondary | ICD-10-CM | POA: Diagnosis not present

## 2022-02-22 DIAGNOSIS — Z4682 Encounter for fitting and adjustment of non-vascular catheter: Secondary | ICD-10-CM | POA: Diagnosis not present

## 2022-02-22 DIAGNOSIS — L0390 Cellulitis, unspecified: Secondary | ICD-10-CM

## 2022-02-22 DIAGNOSIS — I129 Hypertensive chronic kidney disease with stage 1 through stage 4 chronic kidney disease, or unspecified chronic kidney disease: Secondary | ICD-10-CM | POA: Diagnosis not present

## 2022-02-22 DIAGNOSIS — E440 Moderate protein-calorie malnutrition: Secondary | ICD-10-CM | POA: Insufficient documentation

## 2022-02-22 DIAGNOSIS — D689 Coagulation defect, unspecified: Secondary | ICD-10-CM | POA: Diagnosis present

## 2022-02-22 DIAGNOSIS — R590 Localized enlarged lymph nodes: Secondary | ICD-10-CM | POA: Diagnosis not present

## 2022-02-22 DIAGNOSIS — E871 Hypo-osmolality and hyponatremia: Secondary | ICD-10-CM | POA: Diagnosis not present

## 2022-02-22 DIAGNOSIS — Z6841 Body Mass Index (BMI) 40.0 and over, adult: Secondary | ICD-10-CM

## 2022-02-22 DIAGNOSIS — N50819 Testicular pain, unspecified: Secondary | ICD-10-CM | POA: Diagnosis not present

## 2022-02-22 DIAGNOSIS — N189 Chronic kidney disease, unspecified: Secondary | ICD-10-CM | POA: Diagnosis not present

## 2022-02-22 DIAGNOSIS — R009 Unspecified abnormalities of heart beat: Secondary | ICD-10-CM | POA: Diagnosis not present

## 2022-02-22 DIAGNOSIS — Z4901 Encounter for fitting and adjustment of extracorporeal dialysis catheter: Secondary | ICD-10-CM | POA: Diagnosis not present

## 2022-02-22 DIAGNOSIS — R21 Rash and other nonspecific skin eruption: Secondary | ICD-10-CM | POA: Diagnosis present

## 2022-02-22 DIAGNOSIS — N5089 Other specified disorders of the male genital organs: Secondary | ICD-10-CM | POA: Diagnosis not present

## 2022-02-22 DIAGNOSIS — I998 Other disorder of circulatory system: Secondary | ICD-10-CM | POA: Diagnosis not present

## 2022-02-22 DIAGNOSIS — N186 End stage renal disease: Secondary | ICD-10-CM | POA: Diagnosis not present

## 2022-02-22 DIAGNOSIS — B952 Enterococcus as the cause of diseases classified elsewhere: Secondary | ICD-10-CM | POA: Diagnosis present

## 2022-02-22 LAB — CBC WITH DIFFERENTIAL/PLATELET
Abs Immature Granulocytes: 0.01 10*3/uL (ref 0.00–0.07)
Basophils Absolute: 0 10*3/uL (ref 0.0–0.1)
Basophils Relative: 0 %
Eosinophils Absolute: 0 10*3/uL (ref 0.0–0.5)
Eosinophils Relative: 0 %
HCT: 40.6 % (ref 39.0–52.0)
Hemoglobin: 13.1 g/dL (ref 13.0–17.0)
Immature Granulocytes: 0 %
Lymphocytes Relative: 3 %
Lymphs Abs: 0.1 10*3/uL — ABNORMAL LOW (ref 0.7–4.0)
MCH: 29.8 pg (ref 26.0–34.0)
MCHC: 32.3 g/dL (ref 30.0–36.0)
MCV: 92.5 fL (ref 80.0–100.0)
Monocytes Absolute: 0 10*3/uL — ABNORMAL LOW (ref 0.1–1.0)
Monocytes Relative: 1 %
Neutro Abs: 2.2 10*3/uL (ref 1.7–7.7)
Neutrophils Relative %: 96 %
Platelets: 193 10*3/uL (ref 150–400)
RBC: 4.39 MIL/uL (ref 4.22–5.81)
RDW: 16.3 % — ABNORMAL HIGH (ref 11.5–15.5)
WBC: 2.3 10*3/uL — ABNORMAL LOW (ref 4.0–10.5)
nRBC: 1.3 % — ABNORMAL HIGH (ref 0.0–0.2)

## 2022-02-22 LAB — COMPREHENSIVE METABOLIC PANEL
ALT: 33 U/L (ref 0–44)
AST: 42 U/L — ABNORMAL HIGH (ref 15–41)
Albumin: 3.8 g/dL (ref 3.5–5.0)
Alkaline Phosphatase: 99 U/L (ref 38–126)
Anion gap: 11 (ref 5–15)
BUN: 14 mg/dL (ref 6–20)
CO2: 20 mmol/L — ABNORMAL LOW (ref 22–32)
Calcium: 8.6 mg/dL — ABNORMAL LOW (ref 8.9–10.3)
Chloride: 105 mmol/L (ref 98–111)
Creatinine, Ser: 1.44 mg/dL — ABNORMAL HIGH (ref 0.61–1.24)
GFR, Estimated: 56 mL/min — ABNORMAL LOW (ref >60)
Glucose, Bld: 128 mg/dL — ABNORMAL HIGH (ref 70–99)
Potassium: 3.1 mmol/L — ABNORMAL LOW (ref 3.5–5.1)
Sodium: 136 mmol/L (ref 135–145)
Total Bilirubin: 1.8 mg/dL — ABNORMAL HIGH (ref 0.3–1.2)
Total Protein: 6.3 g/dL — ABNORMAL LOW (ref 6.5–8.1)

## 2022-02-22 LAB — LACTIC ACID, PLASMA
Lactic Acid, Venous: 4.3 mmol/L (ref 0.5–1.9)
Lactic Acid, Venous: 5.7 mmol/L (ref 0.5–1.9)

## 2022-02-22 LAB — PROTIME-INR
INR: 1.3 — ABNORMAL HIGH (ref 0.8–1.2)
Prothrombin Time: 16 seconds — ABNORMAL HIGH (ref 11.4–15.2)

## 2022-02-22 LAB — TROPONIN I (HIGH SENSITIVITY)
Troponin I (High Sensitivity): 525 ng/L (ref <18)
Troponin I (High Sensitivity): 1834 ng/L (ref <18)

## 2022-02-22 LAB — I-STAT CHEM 8, ED
BUN: 16 mg/dL (ref 6–20)
Calcium, Ion: 1.11 mmol/L — ABNORMAL LOW (ref 1.15–1.40)
Chloride: 104 mmol/L (ref 98–111)
Creatinine, Ser: 1.2 mg/dL (ref 0.61–1.24)
Glucose, Bld: 122 mg/dL — ABNORMAL HIGH (ref 70–99)
HCT: 39 % (ref 39.0–52.0)
Hemoglobin: 13.3 g/dL (ref 13.0–17.0)
Potassium: 3.2 mmol/L — ABNORMAL LOW (ref 3.5–5.1)
Sodium: 140 mmol/L (ref 135–145)
TCO2: 20 mmol/L — ABNORMAL LOW (ref 22–32)

## 2022-02-22 LAB — MAGNESIUM: Magnesium: 1.1 mg/dL — ABNORMAL LOW (ref 1.7–2.4)

## 2022-02-22 LAB — RESP PANEL BY RT-PCR (FLU A&B, COVID) ARPGX2
Influenza A by PCR: NEGATIVE
Influenza B by PCR: NEGATIVE
SARS Coronavirus 2 by RT PCR: NEGATIVE

## 2022-02-22 LAB — D-DIMER, QUANTITATIVE: D-Dimer, Quant: 2.04 ug/mL-FEU — ABNORMAL HIGH (ref 0.00–0.50)

## 2022-02-22 LAB — TSH: TSH: 0.687 u[IU]/mL (ref 0.350–4.500)

## 2022-02-22 LAB — BRAIN NATRIURETIC PEPTIDE: B Natriuretic Peptide: 307 pg/mL — ABNORMAL HIGH (ref 0.0–100.0)

## 2022-02-22 MED ORDER — METOPROLOL TARTRATE 5 MG/5ML IV SOLN
5.0000 mg | INTRAVENOUS | Status: DC | PRN
  Administered 2022-03-07 – 2022-03-10 (×2): 5 mg via INTRAVENOUS
  Filled 2022-02-22 (×3): qty 5

## 2022-02-22 MED ORDER — SODIUM CHLORIDE 0.9 % IV BOLUS: 1000.0000 mL | Freq: Once | INTRAVENOUS | Status: DC

## 2022-02-22 MED ORDER — DEXMEDETOMIDINE HCL IN NACL 400 MCG/100ML IV SOLN
0.4000 ug/kg/h | INTRAVENOUS | Status: DC
  Administered 2022-02-22 – 2022-02-23 (×2): 0.4 ug/kg/h via INTRAVENOUS

## 2022-02-22 MED ORDER — NOREPINEPHRINE 4 MG/250ML-% IV SOLN: 2.0000 ug/min | INTRAVENOUS | Status: DC

## 2022-02-22 MED ORDER — MAGNESIUM SULFATE 2 GM/50ML IV SOLN
2.0000 g | Freq: Once | INTRAVENOUS | Status: AC
  Administered 2022-02-22: 2 g via INTRAVENOUS
  Filled 2022-02-22: qty 50

## 2022-02-22 MED ORDER — HEPARIN SODIUM (PORCINE) 5000 UNIT/ML IJ SOLN
5000.0000 [IU] | Freq: Three times a day (TID) | INTRAMUSCULAR | Status: DC
  Administered 2022-02-22: 5000 [IU] via SUBCUTANEOUS
  Filled 2022-02-22: qty 1

## 2022-02-22 MED ORDER — DEXMEDETOMIDINE HCL IN NACL 400 MCG/100ML IV SOLN
INTRAVENOUS | Status: AC
  Filled 2022-02-22: qty 100

## 2022-02-22 MED ORDER — POTASSIUM CHLORIDE 10 MEQ/100ML IV SOLN
10.0000 meq | INTRAVENOUS | Status: AC
  Administered 2022-02-22 (×3): 10 meq via INTRAVENOUS
  Filled 2022-02-22 (×3): qty 100

## 2022-02-22 MED ORDER — METRONIDAZOLE 500 MG/100ML IV SOLN
500.0000 mg | Freq: Once | INTRAVENOUS | Status: AC
  Administered 2022-02-22: 500 mg via INTRAVENOUS
  Filled 2022-02-22: qty 100

## 2022-02-22 MED ORDER — FENTANYL CITRATE PF 50 MCG/ML IJ SOSY
100.0000 ug | PREFILLED_SYRINGE | Freq: Once | INTRAMUSCULAR | Status: AC
  Administered 2022-02-22: 100 ug via INTRAVENOUS
  Filled 2022-02-22: qty 2

## 2022-02-22 MED ORDER — CHLORHEXIDINE GLUCONATE CLOTH 2 % EX PADS
6.0000 | MEDICATED_PAD | Freq: Every day | CUTANEOUS | Status: DC
  Administered 2022-02-22 – 2022-03-03 (×10): 6 via TOPICAL

## 2022-02-22 MED ORDER — SODIUM CHLORIDE 0.9 % IV SOLN
250.0000 mL | INTRAVENOUS | Status: DC
  Administered 2022-02-22 – 2022-03-06 (×4): 250 mL via INTRAVENOUS

## 2022-02-22 MED ORDER — SODIUM CHLORIDE 0.9 % IV SOLN
2.0000 g | Freq: Three times a day (TID) | INTRAVENOUS | Status: DC
  Administered 2022-02-22 – 2022-02-23 (×2): 2 g via INTRAVENOUS
  Filled 2022-02-22 (×2): qty 2

## 2022-02-22 MED ORDER — ACETAMINOPHEN 500 MG PO TABS
1000.0000 mg | ORAL_TABLET | Freq: Once | ORAL | Status: AC
  Administered 2022-02-22: 1000 mg via ORAL
  Filled 2022-02-22: qty 2

## 2022-02-22 MED ORDER — HYDROMORPHONE HCL 1 MG/ML IJ SOLN: 0.5000 mg | Freq: Once | INTRAMUSCULAR | Status: DC

## 2022-02-22 MED ORDER — VANCOMYCIN HCL IN DEXTROSE 1-5 GM/200ML-% IV SOLN: 1000.0000 mg | Freq: Once | INTRAVENOUS | Status: DC

## 2022-02-22 MED ORDER — NOREPINEPHRINE 4 MG/250ML-% IV SOLN
0.0000 ug/min | INTRAVENOUS | Status: DC
  Administered 2022-02-22: 2 ug/min via INTRAVENOUS
  Filled 2022-02-22: qty 250

## 2022-02-22 MED ORDER — LACTATED RINGERS IV BOLUS (SEPSIS)
1000.0000 mL | Freq: Once | INTRAVENOUS | Status: AC
  Administered 2022-02-22: 1000 mL via INTRAVENOUS

## 2022-02-22 MED ORDER — IOHEXOL 350 MG/ML SOLN
100.0000 mL | Freq: Once | INTRAVENOUS | Status: AC | PRN
  Administered 2022-02-22: 100 mL via INTRAVENOUS

## 2022-02-22 MED ORDER — SODIUM CHLORIDE 0.9 % IV SOLN
2.0000 g | Freq: Once | INTRAVENOUS | Status: AC
  Administered 2022-02-22: 2 g via INTRAVENOUS
  Filled 2022-02-22: qty 2

## 2022-02-22 MED ORDER — PANTOPRAZOLE SODIUM 40 MG IV SOLR
40.0000 mg | Freq: Every day | INTRAVENOUS | Status: DC
  Administered 2022-02-22 – 2022-02-28 (×7): 40 mg via INTRAVENOUS
  Filled 2022-02-22 (×7): qty 10

## 2022-02-22 MED ORDER — LACTATED RINGERS IV SOLN
INTRAVENOUS | Status: AC
  Administered 2022-02-22: 1000 mL via INTRAVENOUS

## 2022-02-22 MED ORDER — DOCUSATE SODIUM 100 MG PO CAPS: 100.0000 mg | ORAL_CAPSULE | Freq: Two times a day (BID) | ORAL | Status: DC | PRN

## 2022-02-22 MED ORDER — POLYETHYLENE GLYCOL 3350 17 G PO PACK: 17.0000 g | PACK | Freq: Every day | ORAL | Status: DC | PRN

## 2022-02-22 MED ORDER — VANCOMYCIN HCL 2000 MG/400ML IV SOLN
2000.0000 mg | Freq: Once | INTRAVENOUS | Status: AC
  Administered 2022-02-22: 2000 mg via INTRAVENOUS
  Filled 2022-02-22: qty 400

## 2022-02-22 MED ORDER — VANCOMYCIN HCL 750 MG/150ML IV SOLN
750.0000 mg | Freq: Two times a day (BID) | INTRAVENOUS | Status: DC
  Filled 2022-02-22: qty 150

## 2022-02-22 NOTE — Sepsis Progress Note (Signed)
[facility] is following this code sepsis ?

## 2022-02-22 NOTE — H&P (Signed)
? ?NAME:  Jeffrey Ayala, MRN:  [id number], DOB:  February 24, 1961, LOS: 0 ?ADMISSION DATE:  02/22/2022, CONSULTATION DATE:  4/2 ?REFERRING MD:  Doren Custard, CHIEF COMPLAINT:  N/V/D  ? ?History of Present Illness:  ?Jeffrey Ayala, is a 61 y.o. male, who presented to the [facility] ED with a chief complaint of nausea, vomiting, diarrhea. ? ?They have a pertinent past medical history of gastric ulcer, kidney stones, eosinophilic esophagitis, migraines  ? ?History obtained from Jeffrey Ayala (spouse), on 4/1 patient began complaining of feeling unwell. Wife denies sick contacts, questionable food.  Wife denies taking any meds currently.  Around 2 PM on 4/2 the patient developed nausea, vomiting, and diarrhea while at a birthday party and soiled himself. EMS was called. When EMS arrived he was found to be in A-fib with a rate of 120-160. ? ?At the ED he was found to be febrile with a Tmax of 102.9.  A code sepsis was called in the emergency department, blood cultures were drawn, 2 L of IV fluid were given, vanc and cefepime were ordered.  CT chest negative for acute process.  CT abdomen pelvis with and without contrast notable for swollen scrotum concerning for infection, colon filled with diarrhea.  Urology and cardiology were consulted by the ED.  Despite fluid resuscitation the patient developed hypotension requiring vasopressors. ? ?PCCM was consulted for admission. ? ?Pertinent  Medical History  ?gastric ulcer, kidney stones, eosinophilic esophagitis, migraines  ? ?Significant Hospital Events: ?Including procedures, antibiotic start and stop dates in addition to other pertinent events   ?4/2 Presented to the ED. CT chest negative for acute process.  CT abdomen pelvis with and without contrast notable for swollen scrotum concerning for infection, colon filled with diarrhea.  Urology and cardiology were consulted by the ED. Vanco, cefepime>, blood culture> ? ?Interim History / Subjective:  ?See above ? ?On 5 Levophed ? ?Patient  acutely agitated in the emergency department.  Subjective exam limited.  Patient complaining about being hot and abdominal pain. ? ?Objective   ?Blood pressure (!) 88/62, pulse (!) 143, temperature 98.3 ?F (36.8 ?C), temperature source Oral, resp. rate (!) 25, height '5\' 10"'$  (1.778 m), weight 104.3 kg, SpO2 94 %.  Room air ?   ?   ? ?Intake/Output Summary (Last 24 hours) at 02/22/2022 2204 ?Last data filed at 02/22/2022 2121 ?Gross per 24 hour  ?Intake 2300 ml  ?Output --  ?Net 2300 ml  ? ?Filed Weights  ? 02/22/22 1820  ?Weight: 104.3 kg  ? ? ?Examination: ?General: In bed, rolling around, appears comfortable, diaphoretic ?HEENT: MM pink/dry, anicteric, atraumatic ?Neuro: RASS +2, PERRL 32m, oriented to place and situation, following commands, moves all extremities ?CV: S1S2, Afib, no m/r/g appreciated ?PULM:  clear in the upper lobes, clear in the lower lobes, trachea midline, chest expansion symmetric ?GI: firm, bsx4 active, generalized tenderness   ?Extremities: warm/wet, no pretibial edema, capillary refill less than 3 seconds  ?Skin: Scrotum swollen, what appears to be MASD to right groin.  No other rashes or lesions noted ? ?Labs/Imaging: ?CT chest with contrast negative for acute process.  Negative for PE ?CT abdomen pelvis with and without contrast notable for swollen scrotum concerning for infection, colon filled with diarrhea.  Urology and cardiology were consulted by the ED.  ?CXR: No pneumo, infiltrate, effusion noted. ?Twelve-lead: A-fib ? ?COVID, flu negative ?Lactic acid 4.3>5.7 ?CMP: K 3.1, CO2 20, anion gap 11, creatinine 1.44, AST 42, bilirubin 1.8 ?Magnesium: 1.1 ?WBC 2.3 ?BNP 307 ?  Troponin 431>5400 ?D-dimer 2.04 ?C. difficile: Pending ?TSH 0.68 ?UA pending ?CRP pending ?Sed rate pending ?Ultrasound scrotum pending ? ?Resolved Hospital Problem list   ? ? ?Assessment & Plan:  ?Septic shock, no clear etiology at this time ?Lactic acidosis, secondary to above ?CT abdomen pelvis with and without contrast  notable for swollen scrotum concerning for infection, colon filled with diarrhea.  Patient presented with nausea vomiting and diarrhea.  C. difficile pending.  No clear infectious cause on chest x-ray or chest CT. On 5 Levophed. Mucous membranes dry. ?-Goal MAP 65 or greater. Levophed ordered. Titrate medication to goal. ?-S/P 2000 ml fluid resusitation. ?-Follow up BC, UC, and cdif ?-C. difficile precautions ?-On Cefepime and Vancomycin. Narrow as cultures result ?-Trend lactate ?-Obtain procalcitonin, MRSA PCR ?-Obtain ECHO ?-Admit to ICU with continuous telemetry and SPO2 monitoring ? ?New onset A. Fib, suspect secondary to sepsis, TSH 0.68, no history of A-fib ?Troponin elevation, BNP elevation Troponin 525>1834, BNP 307 ?-Cardiology consulted in the emergency department, appreciate assistance, follow-up note and recs ?-Correct electrolyte abnormalities as below ?-Goal MAP 65.  Pressors as above. ? ?Scrotal swelling, suspect secondary to infectious process ?Abdominal pain, suspect secondary to above ?Nausea, vomiting, diarrhea, suspect secondary to infectious process ?-Urology consulted in the emergency department, follow-up notes and recs, appreciate assistance ?-Antibiotics as above ?-Follow-up scrotal ultrasound ?-Follow-up C. Difficile ?-IV fluid for hydration ?-0.5 mg of Dilaudid X1 for pain ? ?AKI, suspect secondary to sepsis ?Creatinine 1.44, baseline appears to be at 0.99 ?-Ensure renal perfusion. Goal MAP 65 or greater. ?-Avoid neprotoxic drugs as possible. ?-Strict I&O's ?-Follow up AM creatinine ?-Follow up UA ?-LR at 150 ? ?Leukopenia, suspect secondary to sepsis ?-Trend on CBC ? ?Acute electrolyte abnormality- hypokalemia, hypomagnesia ?K3.1, magnesium 1.1 ?-Replete K, and magnesium ?-Follow-up on a.m. labs ?-Goal K above 4, goal MG above 2 ? ?History of gastric ulcer ?-PPI ? ? ?Best Practice (right click and "Reselect all SmartList Selections" daily)  ? ?Diet/type: NPO ?DVT prophylaxis:  prophylactic heparin  ?GI prophylaxis: PPI ?Lines: N/A ?Foley:  N/A ?Code Status:  full code ?Last date of multidisciplinary goals of care discussion [4/2 Discussed with wife Fielding Mault at bedside.  Full scope of care.  Once CPR and intubation if needed] ? ?Labs   ?CBC: ?Recent Labs  ?Lab 02/22/22 ?1805 02/22/22 ?1819  ?WBC 2.3*  --   ?NEUTROABS 2.2  --   ?HGB 13.1 13.3  ?HCT 40.6 39.0  ?MCV 92.5  --   ?PLT 193  --   ? ? ?Basic Metabolic Panel: ?Recent Labs  ?Lab 02/22/22 ?1805 02/22/22 ?1819 02/22/22 ?2007  ?NA 136 140  --   ?K 3.1* 3.2*  --   ?CL 105 104  --   ?CO2 20*  --   --   ?GLUCOSE 128* 122*  --   ?BUN 14 16  --   ?CREATININE 1.44* 1.20  --   ?CALCIUM 8.6*  --   --   ?MG  --   --  1.1*  ? ?GFR: ?Estimated Creatinine Clearance: 79.2 mL/min (by C-G formula based on SCr of 1.2 mg/dL). ?Recent Labs  ?Lab 02/22/22 ?1805 02/22/22 ?2007  ?WBC 2.3*  --   ?LATICACIDVEN 4.3* 5.7*  ? ? ?Liver Function Tests: ?Recent Labs  ?Lab 02/22/22 ?8676  ?AST 42*  ?ALT 33  ?ALKPHOS 99  ?BILITOT 1.8*  ?PROT 6.3*  ?ALBUMIN 3.8  ? ?No results for input(s): LIPASE, AMYLASE in the last 168 hours. ?No results for input(s): AMMONIA in  the last 168 hours. ? ?ABG ?   ?Component Value Date/Time  ? TCO2 20 (L) 02/22/2022 1819  ?  ? ?Coagulation Profile: ?Recent Labs  ?Lab 02/22/22 ?8127  ?INR 1.3*  ? ? ?Cardiac Enzymes: ?No results for input(s): CKTOTAL, CKMB, CKMBINDEX, TROPONINI in the last 168 hours. ? ?HbA1C: ?No results found for: HGBA1C ? ?CBG: ?No results for input(s): GLUCAP in the last 168 hours. ? ?Review of Systems:   ?ROS per HPI.  ROS limited due to patient agitation.  ? ?Past Medical History:  ?He,  has a past medical history of Concussion, Eosinophilic esophagitis, Gastric ulcer, History of kidney stones, and Migraine.  ? ?Surgical History:  ? ?Past Surgical History:  ?Procedure Laterality Date  ? CYSTOSCOPY    ? DENTAL SURGERY    ? FINGER SURGERY    ? HERNIA REPAIR    ? at age 46  ? KNEE ARTHROSCOPY Left   ? LITHOTRIPSY     ? NEPHROLITHOTOMY    ? TONSILLECTOMY    ? TOTAL KNEE ARTHROPLASTY Bilateral 11/04/2018  ? Procedure: BILATERAL TOTAL KNEE ARTHROPLASTY;  Surgeon: [name], MD;  Location: [facility];  Service: Orthopedics;  [place]

## 2022-02-22 NOTE — Progress Notes (Signed)
Pharmacy Antibiotic Note ? ?Jeffrey Ayala is a 61 y.o. male admitted on 02/22/2022 presenting with N/V/D, generalized weakness, groin rash/pain, concern for sepsis .  Pharmacy has been consulted for cefepime and vancomycin dosing. ? ?Plan: ?Vancomycin 2000 mg IV x 1, then 750 mg IV q 12h (eAUC 475, Goal AUC 400-550, SCr 1.2) ?Cefepime 2g IV every 8 hours ?Monitor renal function, Cx and clinical progression to narrow ?Vancomycin levels as needed ? ?Height: '5\' 10"'$  (177.8 cm) ?Weight: 104.3 kg (230 lb) ?IBW/kg (Calculated) : 73 ? ?Temp (24hrs), Avg:102.9 ?F (39.4 ?C), Min:102.9 ?F (39.4 ?C), Max:102.9 ?F (39.4 ?C) ? ?Recent Labs  ?Lab 02/22/22 ?1805 02/22/22 ?1819  ?WBC 2.3*  --   ?CREATININE  --  1.20  ?  ?Estimated Creatinine Clearance: 79.2 mL/min (by C-G formula based on SCr of 1.2 mg/dL).   ? ?Allergies  ?Allergen Reactions  ? Codeine Hives  ?  Insomnia, jittery  ? Flomax [Tamsulosin Hcl] Nausea Only  ?  Nausea and fatigue  ? ? ?Bertis Ruddy, PharmD ?Clinical Pharmacist ?ED Pharmacist Phone # [phone] ?02/22/2022 6:49 PM ? ? ?

## 2022-02-22 NOTE — Consult Note (Addendum)
?Urology Consult  ? ?Physician requesting consult: Dr. Godfrey Pick ? ?Reason for consult: Right scrotal edema and pain ? ?History of Present Illness:  Jeffrey Ayala is a 61 y.o. who was in his usual state of health until the last 24 hrs when he felt general malaise.  He developed progressive GI symptoms with nausea, vomiting, and diarrhea.  He became more confused as well and his family called EMS.  He was afebrile on their evaluation but was noted to be febrile and tachycardic in the ED appearing septic.  He is now on pressors. No clear source was readily identified.  He was noted to have some right inguinal irritation over the past couple of days and in the ED he was noted to have a moderately edematous scrotum.  He also appeared to be tender in his right scrotal region.  CT imaging was performed and demonstrated bilateral non-obstructing renal calculi without ureteral calculi.  No abscess was noted on imaging of the inguinal and scrotal region.  Scrotal ultrasound is pending.  No known trauma to scrotum. ? ?Past Medical History:  ?Diagnosis Date  ? Concussion   ? Eosinophilic esophagitis   ? Gastric ulcer   ? History of kidney stones   ? Migraine   ? ? ?Past Surgical History:  ?Procedure Laterality Date  ? CYSTOSCOPY    ? DENTAL SURGERY    ? FINGER SURGERY    ? HERNIA REPAIR    ? at age 38  ? KNEE ARTHROSCOPY Left   ? LITHOTRIPSY    ? NEPHROLITHOTOMY    ? TONSILLECTOMY    ? TOTAL KNEE ARTHROPLASTY Bilateral 11/04/2018  ? Procedure: BILATERAL TOTAL KNEE ARTHROPLASTY;  Surgeon: [name], MD;  Location: [facility];  Service: Orthopedics;  Laterality: Bilateral;  spinal/epidural per anesthesiologist  ? ? ?Medications: ? ?Home meds:  ?No current facility-administered medications on file prior to encounter.  ? ?Current Outpatient Medications on File Prior to Encounter  ?Medication Sig Dispense Refill  ? acetaminophen (TYLENOL) 650 MG CR tablet Take 1,300 mg by mouth daily.    ? allopurinol (ZYLOPRIM) 100 MG tablet  Take 1 tablet (100 mg total) by mouth daily. 30 tablet 11  ? baclofen (LIORESAL) 10 MG tablet TAKE 1 TABLET BY MOUTH THREE TIMES A DAY AS NEEDED 90 tablet 0  ? colchicine-probenecid 0.5-500 MG tablet Take 1 tablet by mouth 2 (two) times daily. 30 tablet 1  ? fluticasone (FLOVENT HFA) 220 MCG/ACT inhaler Inhale 2 puffs into the lungs in the morning and at bedtime. Do not eat or drink 30 minutes after inhalation 1 Inhaler 12  ? glucosamine-chondroitin 500-400 MG tablet Take 1 tablet by mouth daily.    ? pantoprazole (PROTONIX) 40 MG tablet TAKE 1 TABLET BY MOUTH TWICE A DAY 180 tablet 1  ? TURMERIC CURCUMIN PO Take 1 capsule by mouth daily.    ? ? ? ?Scheduled Meds: ?Continuous Infusions: ? ceFEPime (MAXIPIME) IV    ? lactated ringers 1,000 mL (02/22/22 2057)  ? magnesium sulfate bolus IVPB    ? norepinephrine (LEVOPHED) Adult infusion 2 mcg/min (02/22/22 2133)  ? potassium chloride Stopped (02/22/22 2121)  ? vancomycin 2,000 mg (02/22/22 2017)  ? [START ON 02/23/2022] vancomycin    ? ?PRN Meds:.metoprolol tartrate ? ?Allergies:  ?Allergies  ?Allergen Reactions  ? Codeine Hives  ?  Insomnia, jittery  ? Flomax [Tamsulosin Hcl] Nausea Only  ?  Nausea and fatigue  ? ? ?Family History  ?Problem Relation Age of Onset  ?  COPD Mother   ? Sarcoidosis Brother   ? Cancer Brother   ? Stomach cancer Neg Hx   ? Pancreatic cancer Neg Hx   ? Esophageal cancer Neg Hx   ? Colon cancer Neg Hx   ? Rectal cancer Neg Hx   ? ? ?Social History:  reports that he has never smoked. He has never used smokeless tobacco. He reports current alcohol use. He reports that he does not use drugs. ? ?ROS: ?A complete review of systems was performed.  All systems are negative except for pertinent findings as noted. ? ?Physical Exam:  ?Vital signs in last 24 hours: ?Temp:  [98.3 ?F (36.8 ?C)-102.9 ?F (39.4 ?C)] 98.3 ?F (36.8 ?C) (04/02 1909) ?Pulse Rate:  [143-158] 143 (04/02 2045) ?Resp:  [25-36] 25 (04/02 2045) ?BP: (88-140)/(62-73) 88/62 (04/02  2045) ?SpO2:  [92 %-99 %] 94 % (04/02 2045) ?Weight:  [104.3 kg] 104.3 kg (04/02 1820) ?Constitutional:  Confused and mildly combative. ?Cardiovascular: Tachycardic and irregular ?Respiratory: Mask in place for supplemental oxygen with agitated breathing pattern ?GI: Abdomen is soft, nontender, nondistended, no abdominal masses ?Genitourinary: Normal male phallus, testes are descended bilaterally and non-tender and without masses, scrotum is mild to moderately edematous and mild dark discoloration of the right hemiscrotum and this appears to be tender, no fluctuance/drainage/or crepitus, perineum is normal on inspection. ?Rectal: There is an area of ecchymotic skin breakdown just superior to his buttock region in the midline. ?Lymphatic: No lymphadenopathy ?Neurologic: Grossly intact, no focal deficits ?Psychiatric: Severely agitated. ? ?Laboratory Data:  ?Recent Labs  ?  02/22/22 ?1805 02/22/22 ?1819  ?WBC 2.3*  --   ?HGB 13.1 13.3  ?HCT 40.6 39.0  ?PLT 193  --   ? ? ?Recent Labs  ?  02/22/22 ?1805 02/22/22 ?1819  ?NA 136 140  ?K 3.1* 3.2*  ?CL 105 104  ?GLUCOSE 128* 122*  ?BUN 14 16  ?CALCIUM 8.6*  --   ?CREATININE 1.44* 1.20  ? ? ? ?Results for orders placed or performed during the hospital encounter of 02/22/22 (from the past 24 hour(s))  ?Resp Panel by RT-PCR (Flu A&B, Covid) Nasopharyngeal Swab     Status: None  ? Collection Time: 02/22/22  5:50 PM  ? Specimen: Nasopharyngeal Swab; Nasopharyngeal(NP) swabs in vial transport medium  ?Result Value Ref Range  ? SARS Coronavirus 2 by RT PCR NEGATIVE NEGATIVE  ? Influenza A by PCR NEGATIVE NEGATIVE  ? Influenza B by PCR NEGATIVE NEGATIVE  ?Lactic acid, plasma     Status: Abnormal  ? Collection Time: 02/22/22  6:05 PM  ?Result Value Ref Range  ? Lactic Acid, Venous 4.3 (HH) 0.5 - 1.9 mmol/L  ?Comprehensive metabolic panel     Status: Abnormal  ? Collection Time: 02/22/22  6:05 PM  ?Result Value Ref Range  ? Sodium 136 135 - 145 mmol/L  ? Potassium 3.1 (L) 3.5 - 5.1  mmol/L  ? Chloride 105 98 - 111 mmol/L  ? CO2 20 (L) 22 - 32 mmol/L  ? Glucose, Bld 128 (H) 70 - 99 mg/dL  ? BUN 14 6 - 20 mg/dL  ? Creatinine, Ser 1.44 (H) 0.61 - 1.24 mg/dL  ? Calcium 8.6 (L) 8.9 - 10.3 mg/dL  ? Total Protein 6.3 (L) 6.5 - 8.1 g/dL  ? Albumin 3.8 3.5 - 5.0 g/dL  ? AST 42 (H) 15 - 41 U/L  ? ALT 33 0 - 44 U/L  ? Alkaline Phosphatase 99 38 - 126 U/L  ? Total Bilirubin  1.8 (H) 0.3 - 1.2 mg/dL  ? GFR, Estimated 56 (L) >60 mL/min  ? Anion gap 11 5 - 15  ?CBC WITH DIFFERENTIAL     Status: Abnormal  ? Collection Time: 02/22/22  6:05 PM  ?Result Value Ref Range  ? WBC 2.3 (L) 4.0 - 10.5 K/uL  ? RBC 4.39 4.22 - 5.81 MIL/uL  ? Hemoglobin 13.1 13.0 - 17.0 g/dL  ? HCT 40.6 39.0 - 52.0 %  ? MCV 92.5 80.0 - 100.0 fL  ? MCH 29.8 26.0 - 34.0 pg  ? MCHC 32.3 30.0 - 36.0 g/dL  ? RDW 16.3 (H) 11.5 - 15.5 %  ? Platelets 193 150 - 400 K/uL  ? nRBC 1.3 (H) 0.0 - 0.2 %  ? Neutrophils Relative % 96 %  ? Neutro Abs 2.2 1.7 - 7.7 K/uL  ? Lymphocytes Relative 3 %  ? Lymphs Abs 0.1 (L) 0.7 - 4.0 K/uL  ? Monocytes Relative 1 %  ? Monocytes Absolute 0.0 (L) 0.1 - 1.0 K/uL  ? Eosinophils Relative 0 %  ? Eosinophils Absolute 0.0 0.0 - 0.5 K/uL  ? Basophils Relative 0 %  ? Basophils Absolute 0.0 0.0 - 0.1 K/uL  ? Immature Granulocytes 0 %  ? Abs Immature Granulocytes 0.01 0.00 - 0.07 K/uL  ?Brain natriuretic peptide     Status: Abnormal  ? Collection Time: 02/22/22  6:05 PM  ?Result Value Ref Range  ? B Natriuretic Peptide 307.0 (H) 0.0 - 100.0 pg/mL  ?Troponin I (High Sensitivity)     Status: Abnormal  ? Collection Time: 02/22/22  6:05 PM  ?Result Value Ref Range  ? Troponin I (High Sensitivity) 525 (HH) <18 ng/L  ?D-dimer, quantitative     Status: Abnormal  ? Collection Time: 02/22/22  6:05 PM  ?Result Value Ref Range  ? D-Dimer, Quant 2.04 (H) 0.00 - 0.50 ug/mL-FEU  ?Protime-INR     Status: Abnormal  ? Collection Time: 02/22/22  6:05 PM  ?Result Value Ref Range  ? Prothrombin Time 16.0 (H) 11.4 - 15.2 seconds  ? INR 1.3 (H) 0.8  - 1.2  ?I-stat chem 8, ED (not at [facility] or [facility])     Status: Abnormal  ? Collection Time: 02/22/22  6:19 PM  ?Result Value Ref Range  ? Sodium 140 135 - 145 mmol/L  ? Potassium 3.2 (L) 3.5 - 5.1 mmol/L  ? Chlorid

## 2022-02-22 NOTE — ED Provider Notes (Signed)
?[facility] ?Provider Note ? ? ?CSN: [id number] ?Arrival date & time: 02/22/22  1735 ? ?  ? ?History ? ?Chief Complaint  ?Patient presents with  ? Atrial Fibrillation  ? Nausea  ? ? ?Jeffrey Ayala is a 61 y.o. male. ? ? ?Atrial Fibrillation ?Associated symptoms include shortness of breath. Patient resents for nausea vomiting diarrhea starting today.  EMS was called and patient was found to be in atrial fibrillation with RVR.  Heart rate ranged from 120-160.  He does not have a known history of atrial fibrillation.  Known medical history includes HTN, arthritis, PUD, migraines, kidney stones.  On arrival, patient endorses pain in his groin and scrotal area.  History from patient is limited by acuity of condition.  History per wife: 2 days ago, patient states that he felt generally unwell.  He did endorse some discomfort in his right inguinal area.  On inspection, patient did appear to have a intertriginous rash and his wife treated it with Desitin and cornstarch.  This morning, he appeared to be in his normal state of health.  At 1 PM, patient and wife attended a birthday party.  While at the birthday party, patient rapidly became unwell.  Patient was brought home.  He developed nausea, vomiting, and diarrhea.  When he got home, patient seem to be in worse condition.  He has severe generalized weakness and patient's son and wife are unable to even pick him up.  EMS was called.  Other than malaise 2 days ago and the pain in his groin area, patient has not had any recent complaints prior to today.  Patient has not endorsed any recent dysuria.  He did not have any known fevers at home.  He did not seem confused at home but does seem confused to her now. ? ?  ? ?Home Medications ?Prior to Admission medications   ?Medication Sig Start Date End Date Taking? Authorizing Provider  ?acetaminophen (TYLENOL) 650 MG CR tablet Take 650 mg by mouth every 8 (eight) hours as needed for pain or  fever.   Yes [provider]  ?Calcium Carbonate-Vitamin D (OSCAL 500 D-3 PO) Take 1 tablet by mouth daily.   Yes [provider]  ?naproxen sodium (ALEVE) 220 MG tablet Take 220 mg by mouth daily as needed (pain.).   Yes [provider]  ?OVER THE COUNTER MEDICATION Take 1 capsule by mouth daily. Prostate vitamin   Yes [provider]  ?allopurinol (ZYLOPRIM) 100 MG tablet Take 1 tablet (100 mg total) by mouth daily. ?Patient not taking: Reported on 02/22/2022 11/14/18   Rayburn, Neta Mends, PA-C  ?baclofen (LIORESAL) 10 MG tablet TAKE 1 TABLET BY MOUTH THREE TIMES A DAY AS NEEDED ?Patient not taking: Reported on 02/22/2022 09/02/20   Yetta Flock, MD  ?colchicine-probenecid 0.5-500 MG tablet Take 1 tablet by mouth 2 (two) times daily. ?Patient not taking: Reported on 02/22/2022 11/14/18   Rayburn, Neta Mends, PA-C  ?fluticasone (FLOVENT HFA) 220 MCG/ACT inhaler Inhale 2 puffs into the lungs in the morning and at bedtime. Do not eat or drink 30 minutes after inhalation ?Patient not taking: Reported on 02/22/2022 02/12/20   Yetta Flock, MD  ?pantoprazole (PROTONIX) 40 MG tablet TAKE 1 TABLET BY MOUTH TWICE A DAY ?Patient not taking: Reported on 02/22/2022 07/01/20   Armbruster, Carlota Raspberry, MD  ?   ? ?Allergies    ?Codeine and Flomax [tamsulosin hcl]   ? ?Review of Systems   ?  Review of Systems  ?Constitutional:  Positive for fatigue and fever.  ?Respiratory:  Positive for shortness of breath.   ?Gastrointestinal:  Positive for diarrhea, nausea and vomiting.  ?Genitourinary:  Positive for scrotal swelling.  ?Psychiatric/Behavioral:  Positive for confusion.   ? ?Physical Exam ?Updated Vital Signs ?BP 106/64   Pulse (!) 55   Temp 98.4 ?F (36.9 ?C) (Axillary)   Resp (!) 25   Ht '5\' 10"'$  (1.778 m)   Wt 104.3 kg   SpO2 96%   BMI 33.00 kg/m?  ?Physical Exam ?Vitals and nursing note reviewed. Exam conducted with a chaperone present.  ?Constitutional:   ?   General: He is  not in acute distress. ?   Appearance: He is well-developed. He is obese. He is ill-appearing.  ?HENT:  ?   Head: Normocephalic and atraumatic.  ?   Right Ear: External ear normal.  ?   Left Ear: External ear normal.  ?   Nose: Nose normal.  ?   Mouth/Throat:  ?   Mouth: Mucous membranes are dry.  ?Eyes:  ?   General: No scleral icterus. ?   Conjunctiva/sclera: Conjunctivae normal.  ?Cardiovascular:  ?   Rate and Rhythm: Tachycardia present. Rhythm irregular.  ?   Heart sounds: No murmur heard. ?Pulmonary:  ?   Effort: Pulmonary effort is normal. Tachypnea present. No respiratory distress.  ?   Breath sounds: Normal breath sounds. No stridor or decreased air movement. No wheezing or rales.  ?Abdominal:  ?   Palpations: Abdomen is soft.  ?   Tenderness: There is no abdominal tenderness. There is no guarding.  ?Genitourinary: ?   Penis: Normal.   ?   Testes:     ?   Right: Tenderness and swelling present.     ?   Left: Tenderness and swelling present.  ?Musculoskeletal:     ?   General: No swelling.  ?   Cervical back: Normal range of motion and neck supple.  ?Skin: ?   General: Skin is warm and dry.  ?   Capillary Refill: Capillary refill takes less than 2 seconds.  ?   Coloration: Skin is not jaundiced or pale.  ?Neurological:  ?   General: No focal deficit present.  ?   Mental Status: He is alert.  ?   GCS: GCS eye subscore is 3. GCS verbal subscore is 4. GCS motor subscore is 6.  ? ? ?ED Results / Procedures / Treatments   ?Labs ?(all labs ordered are listed, but only abnormal results are displayed) ?Labs Reviewed  ?LACTIC ACID, PLASMA - Abnormal; Notable for the following components:  ?    Result Value  ? Lactic Acid, Venous 4.3 (*)   ? All other components within normal limits  ?LACTIC ACID, PLASMA - Abnormal; Notable for the following components:  ? Lactic Acid, Venous 5.7 (*)   ? All other components within normal limits  ?COMPREHENSIVE METABOLIC PANEL - Abnormal; Notable for the following components:  ?  Potassium 3.1 (*)   ? CO2 20 (*)   ? Glucose, Bld 128 (*)   ? Creatinine, Ser 1.44 (*)   ? Calcium 8.6 (*)   ? Total Protein 6.3 (*)   ? AST 42 (*)   ? Total Bilirubin 1.8 (*)   ? GFR, Estimated 56 (*)   ? All other components within normal limits  ?CBC WITH DIFFERENTIAL/PLATELET - Abnormal; Notable for the following components:  ? WBC 2.3 (*)   ? RDW  16.3 (*)   ? nRBC 1.3 (*)   ? Lymphs Abs 0.1 (*)   ? Monocytes Absolute 0.0 (*)   ? All other components within normal limits  ?BRAIN NATRIURETIC PEPTIDE - Abnormal; Notable for the following components:  ? B Natriuretic Peptide 307.0 (*)   ? All other components within normal limits  ?D-DIMER, QUANTITATIVE (NOT AT [facility] - Abnormal; Notable for the following components:  ? D-Dimer, Quant 2.04 (*)   ? All other components within normal limits  ?PROTIME-INR - Abnormal; Notable for the following components:  ? Prothrombin Time 16.0 (*)   ? INR 1.3 (*)   ? All other components within normal limits  ?MAGNESIUM - Abnormal; Notable for the following components:  ? Magnesium 1.1 (*)   ? All other components within normal limits  ?GLUCOSE, CAPILLARY - Abnormal; Notable for the following components:  ? Glucose-Capillary 113 (*)   ? All other components within normal limits  ?I-STAT CHEM 8, ED - Abnormal; Notable for the following components:  ? Potassium 3.2 (*)   ? Glucose, Bld 122 (*)   ? Calcium, Ion 1.11 (*)   ? TCO2 20 (*)   ? All other components within normal limits  ?TROPONIN I (HIGH SENSITIVITY) - Abnormal; Notable for the following components:  ? Troponin I (High Sensitivity) 525 (*)   ? All other components within normal limits  ?TROPONIN I (HIGH SENSITIVITY) - Abnormal; Notable for the following components:  ? Troponin I (High Sensitivity) 1,834 (*)   ? All other components within normal limits  ?RESP PANEL BY RT-PCR (FLU A&B, COVID) [id number]  ?CULTURE, BLOOD (ROUTINE X 2)  ?CULTURE, BLOOD (ROUTINE X 2)  ?C DIFFICILE QUICK SCREEN W PCR REFLEX    ?TSH  ?URINALYSIS,  ROUTINE W REFLEX MICROSCOPIC  ?SEDIMENTATION RATE  ?C-REACTIVE PROTEIN  ?LACTIC ACID, PLASMA  ?LACTIC ACID, PLASMA  ?HIV ANTIBODY (ROUTINE TESTING W REFLEX)  ?CBC  ?BASIC METABOLIC PANEL  ?MAGNESIUM  ?PHOSPHORUS  ?

## 2022-02-22 NOTE — ED Triage Notes (Signed)
Pt bib [facility] from home c/o nausea, vomitting, and diarhea starting today. EMS found pt to be in atrial fibrillation with rate of 120-160. No hx of afib. 20g LAC ? ?BP 146/90, HR 120-160, Spo2: 97% 2L ?

## 2022-02-22 NOTE — Progress Notes (Addendum)
eLink Physician-Brief Progress Note ?Patient Name: Jeffrey Ayala ?DOB: [birth year] ?MRN: [id number] ? ? ?Date of Service ? 02/22/2022  ?HPI/Events of Note ? Brief new admit: ?NP notes reviewed ? ?61 year old male, today at 2 PM developed nausea vomiting and diarrhea with weakness, EMS called patient found to be in new onset A-fib.  On arrival to ED temperature 102.  Code sepsis called.  On Vanco and cefepime.  CT chest abdomen pelvis-negative for PE, scrotum swollen, diarrhea in colon.  Troponin elevated.  Cardiology and urology consulted.  C. difficile pending.  Scrotal ultrasound pending.  Lactate remains elevated, trending up.  On 5 mics of peripheral Levophed.  Fairly agitated on exam complaining of abdominal pain and feeling hot.  0.5 mg of Dilaudid ordered.  May may need to be started on Precedex once in the ICU.  ? ?Camera; ?On Levo at 20 mcg/min ?Fluids ?Obese abdomen. ?BiPAP 14/05/01/39%Ve 17 L ?CCM MD is at bed side. ?RN asking for Central and art line for SBP 77. ?Precedex gtt for agitation.[name].  ?VS. A fib rate 105, MAP > 75. Sats > 94%.  ? ?Data: reviewed ? ?US scrotum: ?IMPRESSION: ?No testicular abnormality. ?  ?Prominent epididymi bilaterally, but no hyperemia to suggest ?epididymitis. ?  ?Bilateral hydroceles, right greater than left. Internal ?echoes/debris within the right hydrocele. ?  ?Marked scrotal wall thickening without abscess. This may reflect ?cellulitis. ?Troponin up 1834 from 525.  ?EKG: reviewed: A fib 148, ST depression laterally, inferior T down. ?EF normal from 2011 ? ?LA up 5.7 from 4.3 ?Leukopenia at 2.3 K ?Low K/Mag level ?TB 1.8 ?BNP 307, d dimer 2.04 ?TSH 0.6 ?Covid/flu neg ? ? ?A/P; ?Septic shock, fluid unresponsive. Increasing LA.  No PE. No signs of fournier gangrene at this time.  ?- s/p 30 CC/kg fluids, on antibiotics. Urology consultation in AM ?- start levophed , will need central and art line. ?- get cl difficle. Enteric precautions ?- get VBG.  ? ?Elevated troponin,  looks like NSTEMI. EKG changes, no STEMI, New onset a fib RVR. ?- Cardiology been consulted from ED /ECHO  ?- get an EKG, follow troponin at 5 AM as ordered by CCM team  ?- consider heparin gtt/asa/statins if worsening.  ? ?Electrolyte imbalance. ?- replace if not done from ED. Follow levels. Follow UOP.  ?  ?eICU Interventions ? VTE: sq heparin.   ? ? ? ?Intervention Category ?Major Interventions: Sepsis - evaluation and management ?Evaluation Type: New Patient Evaluation ? ?[name] ?02/22/2022, 11:25 PM ? ?01:08 ?Discussed with CCM MD.  ?Now got a line and heparin gtt per [employer] for ACS/NSTEMI ? ?2:20 ? ?Red alert. Code blue at 2 AM. CCM team notified .  ?Camera: ?ACLS epi-bicarb with ROSC. ?PEA arrest. ?On Vent now. ? ?- vent settings ordered. ?Follow CxR and ABG ? ?03:26 ?Post code labs, X ray reviewed. ?ET in place, cardiomegaly. Asymmetric pulmonary edema.  ?Low mag got 2 gm before mid night, mag 1.7 now. ?K 2.9.  ?Discussed with RN ? ?- Kcl/mag replacement ordering ?Repeat ABG/ion calcium.  ? ?03:45 ?LA elevated. Post code. ?Follow LA at 6 AM, keep MAP > 65.  ? ?4:11 ? ?RN discussion ? Deupree pressure dropped to SBP 80. Epi @ 8, Levo @ 40, Vaso 0.03, post fenta push. ? In process of getting CVP ?Precedex gtt.  ? ?Restraints ordered ?Stat ABG, if metabolic acidosis not better, need bicarb gtt.  ? ?4:23 ?Asking for propofol, agitated. Bucking vent and hypertesnive. ? ?Propofol gtt ordered-protocol.  ?  Follow ABG.  ? ?4:51 ?ABG: pH 7.12, hco3 still at 14.  ?P/F 220. VT down to 550/09/18/79% ?- follow ABG ?- Bicarb 100 IV once. ? ? ?Discussed with RN.  ? ? ? ?

## 2022-02-23 ENCOUNTER — Inpatient Hospital Stay (HOSPITAL_COMMUNITY): Payer: BC Managed Care – PPO

## 2022-02-23 DIAGNOSIS — I4891 Unspecified atrial fibrillation: Secondary | ICD-10-CM

## 2022-02-23 DIAGNOSIS — L8990 Pressure ulcer of unspecified site, unspecified stage: Secondary | ICD-10-CM | POA: Insufficient documentation

## 2022-02-23 DIAGNOSIS — A419 Sepsis, unspecified organism: Secondary | ICD-10-CM | POA: Diagnosis not present

## 2022-02-23 DIAGNOSIS — R6521 Severe sepsis with septic shock: Secondary | ICD-10-CM | POA: Diagnosis not present

## 2022-02-23 LAB — POCT I-STAT 7, (LYTES, BLD GAS, ICA,H+H)
Acid-base deficit: 14 mmol/L — ABNORMAL HIGH (ref 0.0–2.0)
Acid-base deficit: 14 mmol/L — ABNORMAL HIGH (ref 0.0–2.0)
Bicarbonate: 16.5 mmol/L — ABNORMAL LOW (ref 20.0–28.0)
Bicarbonate: 14.6 mmol/L — ABNORMAL LOW (ref 20.0–28.0)
Calcium, Ion: 1.08 mmol/L — ABNORMAL LOW (ref 1.15–1.40)
Calcium, Ion: 1.1 mmol/L — ABNORMAL LOW (ref 1.15–1.40)
HCT: 37 % — ABNORMAL LOW (ref 39.0–52.0)
HCT: 40 % (ref 39.0–52.0)
Hemoglobin: 12.6 g/dL — ABNORMAL LOW (ref 13.0–17.0)
Hemoglobin: 13.6 g/dL (ref 13.0–17.0)
O2 Saturation: 98 %
O2 Saturation: 100 %
Patient temperature: 98.4
Patient temperature: 37.2
Potassium: 3.1 mmol/L — ABNORMAL LOW (ref 3.5–5.1)
Potassium: 3.4 mmol/L — ABNORMAL LOW (ref 3.5–5.1)
Sodium: 141 mmol/L (ref 135–145)
Sodium: 139 mmol/L (ref 135–145)
TCO2: 18 mmol/L — ABNORMAL LOW (ref 22–32)
TCO2: 16 mmol/L — ABNORMAL LOW (ref 22–32)
pCO2 arterial: 57.6 mmHg — ABNORMAL HIGH (ref 32–48)
pCO2 arterial: 45 mmHg (ref 32–48)
pH, Arterial: 7.066 — CL (ref 7.35–7.45)
pH, Arterial: 7.121 — CL (ref 7.35–7.45)
pO2, Arterial: 139 mmHg — ABNORMAL HIGH (ref 83–108)
pO2, Arterial: 223 mmHg — ABNORMAL HIGH (ref 83–108)

## 2022-02-23 LAB — MRSA NEXT GEN BY PCR, NASAL: MRSA by PCR Next Gen: NOT DETECTED

## 2022-02-23 LAB — BASIC METABOLIC PANEL
Anion gap: 18 — ABNORMAL HIGH (ref 5–15)
Anion gap: 19 — ABNORMAL HIGH (ref 5–15)
Anion gap: 15 (ref 5–15)
BUN: 21 mg/dL — ABNORMAL HIGH (ref 6–20)
BUN: 29 mg/dL — ABNORMAL HIGH (ref 6–20)
BUN: 36 mg/dL — ABNORMAL HIGH (ref 6–20)
CO2: 15 mmol/L — ABNORMAL LOW (ref 22–32)
CO2: 15 mmol/L — ABNORMAL LOW (ref 22–32)
CO2: 19 mmol/L — ABNORMAL LOW (ref 22–32)
Calcium: 7.2 mg/dL — ABNORMAL LOW (ref 8.9–10.3)
Calcium: 7.4 mg/dL — ABNORMAL LOW (ref 8.9–10.3)
Calcium: 7.2 mg/dL — ABNORMAL LOW (ref 8.9–10.3)
Chloride: 104 mmol/L (ref 98–111)
Chloride: 104 mmol/L (ref 98–111)
Chloride: 104 mmol/L (ref 98–111)
Creatinine, Ser: 2.61 mg/dL — ABNORMAL HIGH (ref 0.61–1.24)
Creatinine, Ser: 3.46 mg/dL — ABNORMAL HIGH (ref 0.61–1.24)
Creatinine, Ser: 3.43 mg/dL — ABNORMAL HIGH (ref 0.61–1.24)
GFR, Estimated: 27 mL/min — ABNORMAL LOW (ref >60)
GFR, Estimated: 19 mL/min — ABNORMAL LOW (ref >60)
GFR, Estimated: 20 mL/min — ABNORMAL LOW (ref >60)
Glucose, Bld: 191 mg/dL — ABNORMAL HIGH (ref 70–99)
Glucose, Bld: 245 mg/dL — ABNORMAL HIGH (ref 70–99)
Glucose, Bld: 173 mg/dL — ABNORMAL HIGH (ref 70–99)
Potassium: 2.9 mmol/L — ABNORMAL LOW (ref 3.5–5.1)
Potassium: 3.2 mmol/L — ABNORMAL LOW (ref 3.5–5.1)
Potassium: 3.7 mmol/L (ref 3.5–5.1)
Sodium: 137 mmol/L (ref 135–145)
Sodium: 138 mmol/L (ref 135–145)
Sodium: 138 mmol/L (ref 135–145)

## 2022-02-23 LAB — C-REACTIVE PROTEIN: CRP: 13.8 mg/dL — ABNORMAL HIGH (ref <1.0)

## 2022-02-23 LAB — CBC
HCT: 38.8 % — ABNORMAL LOW (ref 39.0–52.0)
Hemoglobin: 12.5 g/dL — ABNORMAL LOW (ref 13.0–17.0)
MCH: 30.3 pg (ref 26.0–34.0)
MCHC: 32.2 g/dL (ref 30.0–36.0)
MCV: 93.9 fL (ref 80.0–100.0)
Platelets: 163 10*3/uL (ref 150–400)
RBC: 4.13 MIL/uL — ABNORMAL LOW (ref 4.22–5.81)
RDW: 17.1 % — ABNORMAL HIGH (ref 11.5–15.5)
WBC: 17.6 10*3/uL — ABNORMAL HIGH (ref 4.0–10.5)
nRBC: 0.7 % — ABNORMAL HIGH (ref 0.0–0.2)

## 2022-02-23 LAB — URINALYSIS, ROUTINE W REFLEX MICROSCOPIC
Bilirubin Urine: NEGATIVE
Glucose, UA: NEGATIVE mg/dL
Ketones, ur: NEGATIVE mg/dL
Nitrite: NEGATIVE
Protein, ur: 30 mg/dL — AB
Specific Gravity, Urine: 1.024 (ref 1.005–1.030)
pH: 5 (ref 5.0–8.0)

## 2022-02-23 LAB — HIV ANTIBODY (ROUTINE TESTING W REFLEX): HIV Screen 4th Generation wRfx: NONREACTIVE

## 2022-02-23 LAB — MAGNESIUM
Magnesium: 1.7 mg/dL (ref 1.7–2.4)
Magnesium: 2.1 mg/dL (ref 1.7–2.4)

## 2022-02-23 LAB — HEPARIN LEVEL (UNFRACTIONATED)
Heparin Unfractionated: 0.18 IU/mL — ABNORMAL LOW (ref 0.30–0.70)
Heparin Unfractionated: 0.23 IU/mL — ABNORMAL LOW (ref 0.30–0.70)

## 2022-02-23 LAB — GLUCOSE, CAPILLARY
Glucose-Capillary: 113 mg/dL — ABNORMAL HIGH (ref 70–99)
Glucose-Capillary: 128 mg/dL — ABNORMAL HIGH (ref 70–99)
Glucose-Capillary: 141 mg/dL — ABNORMAL HIGH (ref 70–99)
Glucose-Capillary: 151 mg/dL — ABNORMAL HIGH (ref 70–99)
Glucose-Capillary: 206 mg/dL — ABNORMAL HIGH (ref 70–99)
Glucose-Capillary: 208 mg/dL — ABNORMAL HIGH (ref 70–99)
Glucose-Capillary: 234 mg/dL — ABNORMAL HIGH (ref 70–99)

## 2022-02-23 LAB — ECHOCARDIOGRAM COMPLETE
Area-P 1/2: 4.06 cm2
Calc EF: 60.1 %
Height: 70 in
S' Lateral: 2.8 cm
Single Plane A2C EF: 60.8 %
Single Plane A4C EF: 57.2 %
Weight: 3680 oz

## 2022-02-23 LAB — C DIFFICILE QUICK SCREEN W PCR REFLEX
C Diff antigen: NEGATIVE
C Diff interpretation: NOT DETECTED
C Diff toxin: NEGATIVE

## 2022-02-23 LAB — SEDIMENTATION RATE: Sed Rate: 20 mm/hr — ABNORMAL HIGH (ref 0–16)

## 2022-02-23 LAB — PHOSPHORUS: Phosphorus: 7 mg/dL — ABNORMAL HIGH (ref 2.5–4.6)

## 2022-02-23 LAB — LACTIC ACID, PLASMA
Lactic Acid, Venous: >9 mmol/L (ref 0.5–1.9)
Lactic Acid, Venous: 7.7 mmol/L (ref 0.5–1.9)
Lactic Acid, Venous: 3.9 mmol/L (ref 0.5–1.9)

## 2022-02-23 LAB — TROPONIN I (HIGH SENSITIVITY)
Troponin I (High Sensitivity): 2807 ng/L (ref <18)
Troponin I (High Sensitivity): 3550 ng/L (ref <18)

## 2022-02-23 MED ORDER — MAGNESIUM SULFATE 2 GM/50ML IV SOLN
2.0000 g | Freq: Once | INTRAVENOUS | Status: AC
  Administered 2022-02-23: 2 g via INTRAVENOUS
  Filled 2022-02-23: qty 50

## 2022-02-23 MED ORDER — VASOPRESSIN 20 UNITS/100 ML INFUSION FOR SHOCK
0.0000 [IU]/min | INTRAVENOUS | Status: DC
  Administered 2022-02-23 – 2022-02-28 (×13): 0.03 [IU]/min via INTRAVENOUS
  Filled 2022-02-23 (×12): qty 100

## 2022-02-23 MED ORDER — HYDROCORTISONE SOD SUC (PF) 100 MG IJ SOLR
INTRAMUSCULAR | Status: AC
  Administered 2022-02-23: 100 mg
  Filled 2022-02-23: qty 2

## 2022-02-23 MED ORDER — SODIUM CHLORIDE 0.9% FLUSH
10.0000 mL | Freq: Two times a day (BID) | INTRAVENOUS | Status: DC
  Administered 2022-02-23 – 2022-03-18 (×33): 10 mL

## 2022-02-23 MED ORDER — NOREPINEPHRINE 4 MG/250ML-% IV SOLN
0.0000 ug/min | INTRAVENOUS | Status: DC
  Administered 2022-02-23: 30 ug/min via INTRAVENOUS
  Administered 2022-02-23: 20 ug/min via INTRAVENOUS
  Filled 2022-02-23: qty 250

## 2022-02-23 MED ORDER — ALBUMIN HUMAN 25 % IV SOLN
25.0000 g | Freq: Once | INTRAVENOUS | Status: AC
  Administered 2022-02-23: 25 g via INTRAVENOUS
  Filled 2022-02-23: qty 100

## 2022-02-23 MED ORDER — ETOMIDATE 2 MG/ML IV SOLN
INTRAVENOUS | Status: AC
  Filled 2022-02-23: qty 20

## 2022-02-23 MED ORDER — PROPOFOL 1000 MG/100ML IV EMUL
5.0000 ug/kg/min | INTRAVENOUS | Status: DC
  Administered 2022-02-23 (×3): 30 ug/kg/min via INTRAVENOUS
  Administered 2022-02-23: 50 ug/kg/min via INTRAVENOUS
  Administered 2022-02-23: 10 ug/kg/min via INTRAVENOUS
  Administered 2022-02-23: 30 ug/kg/min via INTRAVENOUS
  Administered 2022-02-24: 40 ug/kg/min via INTRAVENOUS
  Administered 2022-02-24 (×2): 30 ug/kg/min via INTRAVENOUS
  Administered 2022-02-24: 35 ug/kg/min via INTRAVENOUS
  Administered 2022-02-24: 30 ug/kg/min via INTRAVENOUS
  Administered 2022-02-25: 15 ug/kg/min via INTRAVENOUS
  Administered 2022-02-25 (×2): 40 ug/kg/min via INTRAVENOUS
  Administered 2022-02-25: 20 ug/kg/min via INTRAVENOUS
  Administered 2022-02-25: 35 ug/kg/min via INTRAVENOUS
  Administered 2022-02-26: 15 ug/kg/min via INTRAVENOUS
  Filled 2022-02-23 (×16): qty 100

## 2022-02-23 MED ORDER — FLUMAZENIL 0.5 MG/5ML IV SOLN
INTRAVENOUS | Status: AC
  Administered 2022-02-23: 0.5 mg
  Filled 2022-02-23: qty 5

## 2022-02-23 MED ORDER — HEPARIN BOLUS VIA INFUSION
4000.0000 [IU] | Freq: Once | INTRAVENOUS | Status: AC
Start: 2022-02-23 — End: 2022-02-23
  Administered 2022-02-23: 4000 [IU] via INTRAVENOUS
  Filled 2022-02-23: qty 4000

## 2022-02-23 MED ORDER — VASOPRESSIN 20 UNITS/100 ML INFUSION FOR SHOCK
INTRAVENOUS | Status: AC
  Administered 2022-02-23: 20 [IU]
  Filled 2022-02-23: qty 100

## 2022-02-23 MED ORDER — MIDAZOLAM HCL 2 MG/2ML IJ SOLN
INTRAMUSCULAR | Status: AC
Start: 2022-02-23 — End: 2022-02-23
  Administered 2022-02-23: 2 mg
  Filled 2022-02-23: qty 2

## 2022-02-23 MED ORDER — PERFLUTREN LIPID MICROSPHERE
1.0000 mL | INTRAVENOUS | Status: AC | PRN
  Administered 2022-02-23: 2 mL via INTRAVENOUS
  Filled 2022-02-23: qty 10

## 2022-02-23 MED ORDER — SODIUM CHLORIDE 0.9% FLUSH: 10.0000 mL | INTRAVENOUS | Status: DC | PRN

## 2022-02-23 MED ORDER — FLUDROCORTISONE ACETATE 0.1 MG PO TABS
0.1000 mg | ORAL_TABLET | Freq: Every day | ORAL | Status: DC
  Administered 2022-02-23 – 2022-03-01 (×7): 0.1 mg
  Filled 2022-02-23 (×8): qty 1

## 2022-02-23 MED ORDER — INSULIN ASPART 100 UNIT/ML IJ SOLN
0.0000 [IU] | INTRAMUSCULAR | Status: DC
  Administered 2022-02-23: 5 [IU] via SUBCUTANEOUS
  Administered 2022-02-23: 3 [IU] via SUBCUTANEOUS
  Administered 2022-02-23 – 2022-02-24 (×3): 2 [IU] via SUBCUTANEOUS
  Administered 2022-02-25 (×5): 3 [IU] via SUBCUTANEOUS
  Administered 2022-02-26: 2 [IU] via SUBCUTANEOUS
  Administered 2022-02-26 (×4): 3 [IU] via SUBCUTANEOUS
  Administered 2022-02-26 – 2022-02-27 (×4): 2 [IU] via SUBCUTANEOUS
  Administered 2022-02-27 (×2): 3 [IU] via SUBCUTANEOUS
  Administered 2022-02-27 – 2022-03-02 (×11): 2 [IU] via SUBCUTANEOUS
  Administered 2022-03-03: 3 [IU] via SUBCUTANEOUS
  Administered 2022-03-03: 2 [IU] via SUBCUTANEOUS
  Administered 2022-03-04: 3 [IU] via SUBCUTANEOUS

## 2022-02-23 MED ORDER — METHYLENE BLUE 0.5 % INJ SOLN: 1.0000 mg/kg | Freq: Once | Status: DC

## 2022-02-23 MED ORDER — PHENYLEPHRINE 40 MCG/ML (10ML) SYRINGE FOR IV PUSH (FOR BLOOD PRESSURE SUPPORT)
PREFILLED_SYRINGE | INTRAVENOUS | Status: AC
  Administered 2022-02-23: 400 ug
  Filled 2022-02-23: qty 10

## 2022-02-23 MED ORDER — PHENYLEPHRINE HCL-NACL 20-0.9 MG/250ML-% IV SOLN
INTRAVENOUS | Status: AC
  Filled 2022-02-23: qty 250

## 2022-02-23 MED ORDER — VANCOMYCIN VARIABLE DOSE PER UNSTABLE RENAL FUNCTION (PHARMACIST DOSING): Status: DC

## 2022-02-23 MED ORDER — PROPOFOL 1000 MG/100ML IV EMUL
INTRAVENOUS | Status: AC
  Filled 2022-02-23: qty 100

## 2022-02-23 MED ORDER — HALOPERIDOL LACTATE 5 MG/ML IJ SOLN
INTRAMUSCULAR | Status: AC
  Filled 2022-02-23: qty 1

## 2022-02-23 MED ORDER — ORAL CARE MOUTH RINSE
15.0000 mL | OROMUCOSAL | Status: DC
  Administered 2022-02-23 – 2022-03-02 (×70): 15 mL via OROMUCOSAL

## 2022-02-23 MED ORDER — SODIUM BICARBONATE 8.4 % IV SOLN
100.0000 meq | Freq: Once | INTRAVENOUS | Status: AC
  Administered 2022-02-23: 100 meq via INTRAVENOUS
  Filled 2022-02-23: qty 100

## 2022-02-23 MED ORDER — SODIUM CHLORIDE 0.9 % IV SOLN
2.0000 g | INTRAVENOUS | Status: DC
  Administered 2022-02-24 – 2022-02-25 (×2): 2 g via INTRAVENOUS
  Filled 2022-02-23 (×2): qty 2

## 2022-02-23 MED ORDER — MIDAZOLAM HCL 2 MG/2ML IJ SOLN
INTRAMUSCULAR | Status: AC
  Administered 2022-02-23: 2 mg
  Filled 2022-02-23: qty 2

## 2022-02-23 MED ORDER — CALCIUM GLUCONATE-NACL 1-0.675 GM/50ML-% IV SOLN
1.0000 g | Freq: Once | INTRAVENOUS | Status: AC
  Administered 2022-02-23: 1000 mg via INTRAVENOUS
  Filled 2022-02-23: qty 50

## 2022-02-23 MED ORDER — FENTANYL CITRATE (PF) 100 MCG/2ML IJ SOLN
50.0000 ug | INTRAMUSCULAR | Status: DC | PRN
  Administered 2022-02-23: 50 ug via INTRAVENOUS
  Filled 2022-02-23: qty 2

## 2022-02-23 MED ORDER — NOREPINEPHRINE 16 MG/250ML-% IV SOLN
0.0000 ug/min | INTRAVENOUS | Status: DC
  Administered 2022-02-23: 20 ug/min via INTRAVENOUS
  Administered 2022-02-23: 61 ug/min via INTRAVENOUS
  Administered 2022-02-23: 80 ug/min via INTRAVENOUS
  Administered 2022-02-23 – 2022-02-25 (×7): 40 ug/min via INTRAVENOUS
  Administered 2022-02-26: 11 ug/min via INTRAVENOUS
  Administered 2022-02-26: 19 ug/min via INTRAVENOUS
  Administered 2022-02-28 (×2): 6 ug/min via INTRAVENOUS
  Filled 2022-02-23 (×7): qty 250
  Filled 2022-02-23: qty 500
  Filled 2022-02-23 (×3): qty 250

## 2022-02-23 MED ORDER — ROCURONIUM BROMIDE 10 MG/ML (PF) SYRINGE
PREFILLED_SYRINGE | INTRAVENOUS | Status: AC
  Administered 2022-02-23: 40 mg
  Filled 2022-02-23: qty 10

## 2022-02-23 MED ORDER — CHLORHEXIDINE GLUCONATE 0.12% ORAL RINSE (MEDLINE KIT)
15.0000 mL | Freq: Two times a day (BID) | OROMUCOSAL | Status: DC
  Administered 2022-02-23 – 2022-03-16 (×39): 15 mL via OROMUCOSAL

## 2022-02-23 MED ORDER — POTASSIUM CHLORIDE 10 MEQ/50ML IV SOLN
10.0000 meq | INTRAVENOUS | Status: AC
  Administered 2022-02-23 (×2): 10 meq via INTRAVENOUS
  Filled 2022-02-23 (×2): qty 50

## 2022-02-23 MED ORDER — DEXTROSE 5 % IV SOLN
0.5000 mg/kg/h | Freq: Once | INTRAVENOUS | Status: AC
  Administered 2022-02-23: 0.5 mg/kg/h via INTRAVENOUS
  Filled 2022-02-23: qty 50

## 2022-02-23 MED ORDER — HEPARIN (PORCINE) 25000 UT/250ML-% IV SOLN
2650.0000 [IU]/h | INTRAVENOUS | Status: DC
  Administered 2022-02-23: 1450 [IU]/h via INTRAVENOUS
  Administered 2022-02-23: 1700 [IU]/h via INTRAVENOUS
  Administered 2022-02-24 (×2): 1950 [IU]/h via INTRAVENOUS
  Administered 2022-02-25: 2450 [IU]/h via INTRAVENOUS
  Administered 2022-02-25: 2150 [IU]/h via INTRAVENOUS
  Administered 2022-02-26: 2650 [IU]/h via INTRAVENOUS
  Filled 2022-02-23 (×7): qty 250

## 2022-02-23 MED ORDER — INSULIN ASPART 100 UNIT/ML IJ SOLN: 0.0000 [IU] | Freq: Three times a day (TID) | INTRAMUSCULAR | Status: DC

## 2022-02-23 MED ORDER — INSULIN ASPART 100 UNIT/ML IJ SOLN
2.0000 [IU] | INTRAMUSCULAR | Status: DC
  Administered 2022-02-23 (×2): 6 [IU] via SUBCUTANEOUS

## 2022-02-23 MED ORDER — HYDROCORTISONE SOD SUC (PF) 100 MG IJ SOLR
100.0000 mg | Freq: Two times a day (BID) | INTRAMUSCULAR | Status: DC
  Administered 2022-02-23 – 2022-02-28 (×11): 100 mg via INTRAVENOUS
  Filled 2022-02-23 (×12): qty 2

## 2022-02-23 MED ORDER — METHYLENE BLUE 0.5 % INJ SOLN
2.0000 mg/kg | Freq: Once | Status: AC
  Administered 2022-02-23: 208.5 mg via INTRAVENOUS
  Filled 2022-02-23: qty 41.7

## 2022-02-23 MED ORDER — POTASSIUM CHLORIDE 10 MEQ/50ML IV SOLN
10.0000 meq | INTRAVENOUS | Status: AC
  Administered 2022-02-23 (×6): 10 meq via INTRAVENOUS
  Filled 2022-02-23 (×6): qty 50

## 2022-02-23 MED ORDER — DEXTROSE 5 % IV SOLN
2.0000 mg/kg | Freq: Once | INTRAVENOUS | Status: AC
  Administered 2022-02-23: 208.5 mg via INTRAVENOUS
  Filled 2022-02-23: qty 41.7

## 2022-02-23 MED ORDER — EPINEPHRINE HCL 5 MG/250ML IV SOLN IN NS
INTRAVENOUS | Status: AC
  Filled 2022-02-23: qty 250

## 2022-02-23 MED ORDER — STERILE WATER FOR INJECTION IV SOLN
INTRAVENOUS | Status: DC
  Filled 2022-02-23 (×3): qty 1000
  Filled 2022-02-23: qty 150
  Filled 2022-02-23 (×3): qty 1000
  Filled 2022-02-23: qty 150

## 2022-02-23 MED ORDER — EPINEPHRINE HCL 5 MG/250ML IV SOLN IN NS
0.5000 ug/min | INTRAVENOUS | Status: DC
  Administered 2022-02-23: 20 ug/min via INTRAVENOUS
  Administered 2022-02-23: 18 ug/min via INTRAVENOUS
  Administered 2022-02-23: 10 ug/min via INTRAVENOUS
  Administered 2022-02-23: 20 ug/min via INTRAVENOUS
  Administered 2022-02-24: 8 ug/min via INTRAVENOUS
  Administered 2022-02-24: 7 ug/min via INTRAVENOUS
  Administered 2022-02-25: 9 ug/min via INTRAVENOUS
  Filled 2022-02-23 (×6): qty 250

## 2022-02-23 MED ORDER — SODIUM CHLORIDE 0.9 % IV SOLN
0.1000 mg/kg/h | INTRAVENOUS | Status: AC
  Administered 2022-02-23: 0.1 mg/kg/h via INTRAVENOUS
  Administered 2022-02-24 – 2022-02-25 (×3): 0.3 mg/kg/h via INTRAVENOUS
  Administered 2022-02-25 (×2): 0.4 mg/kg/h via INTRAVENOUS
  Administered 2022-02-26: 0.3 mg/kg/h via INTRAVENOUS
  Administered 2022-02-26: 0.4 mg/kg/h via INTRAVENOUS
  Administered 2022-02-27 (×2): 0.3 mg/kg/h via INTRAVENOUS
  Filled 2022-02-23 (×9): qty 5

## 2022-02-23 NOTE — Procedures (Signed)
Arterial Catheter Insertion Procedure Note ? ?Jeffrey Ayala  ?[id number]  ?1961-05-03 ? ?Date:02/23/22  ?Time:2:40 AM  ? ? ?Provider Performing: Laurin Coder  ? ? ?Procedure: Insertion of Arterial Line [phone]) with US guidance ([id number])  ? ?Indication(s) ?Blood pressure monitoring and/or need for frequent ABGs ? ?Consent ?Risks of the procedure as well as the alternatives and risks of each were explained to the patient and/or caregiver.  Consent for the procedure was obtained and is signed in the bedside chart ? ?Anesthesia ?None ? ? ?Time Out ?Verified patient identification, verified procedure, site/side was marked, verified correct patient position, special equipment/implants available, medications/allergies/relevant history reviewed, required imaging and test results available. ? ? ?Sterile Technique ?Maximal sterile technique including full sterile barrier drape, hand hygiene, sterile gown, sterile gloves, mask, hair covering, sterile ultrasound probe cover (if used). ? ? ?Procedure Description ?Area of catheter insertion was cleaned with chlorhexidine and draped in sterile fashion. With real-time ultrasound guidance an arterial catheter was placed into the right radial artery.  Appropriate arterial tracings confirmed on monitor.   ? ? ?Complications/Tolerance ?None; patient tolerated the procedure well. ? ? ?EBL ?Minimal ? ? ?Specimen(s) ?None ? ?

## 2022-02-23 NOTE — Progress Notes (Signed)
ANTICOAGULATION CONSULT NOTE - Initial Consult ? ?Pharmacy Consult for heparin ?Indication: atrial fibrillation ? ?Allergies  ?Allergen Reactions  ? Codeine Hives  ?  Insomnia, jittery  ? Flomax [Tamsulosin Hcl] Nausea Only  ?  Nausea and fatigue  ? ? ?Patient Measurements: ?Height: '5\' 10"'$  (177.8 cm) ?Weight: 104.3 kg (230 lb) ?IBW/kg (Calculated) : 73 ?Heparin Dosing Weight: 95 kg ? ?Vital Signs: ?Temp: 99.7 ?F (37.6 ?C) (04/03 0700) ?Temp Source: Bladder (04/03 0406) ?BP: 99/64 (04/03 0700) ?Pulse Rate: 128 (04/03 0749) ? ?Labs: ?Recent Labs  ?  02/22/22 ?1805 02/22/22 ?1819 02/22/22 ?2007 02/23/22 ?0225 02/23/22 ?2542 02/23/22 ?7062 02/23/22 ?1208  ?HGB 13.1 13.3  --  12.6* 12.5* 13.6  --   ?HCT 40.6 39.0  --  37.0* 38.8* 40.0  --   ?PLT 193  --   --   --  163  --   --   ?LABPROT 16.0*  --   --   --   --   --   --   ?INR 1.3*  --   --   --   --   --   --   ?HEPARINUNFRC  --   --   --   --   --   --  0.18*  ?CREATININE 1.44* 1.20  --   --  2.61*  --   --   ?TROPONINIHS 525*  --  1,834*  --  2,807*  --   --   ? ? ? ?Estimated Creatinine Clearance: 36.4 mL/min (A) (by C-G formula based on SCr of 2.61 mg/dL (H)). ? ? ?Medical History: ?Past Medical History:  ?Diagnosis Date  ? Concussion   ? Eosinophilic esophagitis   ? Gastric ulcer   ? History of kidney stones   ? Migraine   ? ? ?Medications:  ?Medications Prior to Admission  ?Medication Sig Dispense Refill Last Dose  ? acetaminophen (TYLENOL) 650 MG CR tablet Take 650 mg by mouth every 8 (eight) hours as needed for pain or fever.   02/22/2022  ? Calcium Carbonate-Vitamin D (OSCAL 500 D-3 PO) Take 1 tablet by mouth daily.   Past Week  ? naproxen sodium (ALEVE) 220 MG tablet Take 220 mg by mouth daily as needed (pain.).   02/22/2022  ? OVER THE COUNTER MEDICATION Take 1 capsule by mouth daily. Prostate vitamin   Past Week  ? allopurinol (ZYLOPRIM) 100 MG tablet Take 1 tablet (100 mg total) by mouth daily. (Patient not taking: Reported on 02/22/2022) 30 tablet 11 Not  Taking  ? baclofen (LIORESAL) 10 MG tablet TAKE 1 TABLET BY MOUTH THREE TIMES A DAY AS NEEDED (Patient not taking: Reported on 02/22/2022) 90 tablet 0 Not Taking  ? colchicine-probenecid 0.5-500 MG tablet Take 1 tablet by mouth 2 (two) times daily. (Patient not taking: Reported on 02/22/2022) 30 tablet 1 Not Taking  ? fluticasone (FLOVENT HFA) 220 MCG/ACT inhaler Inhale 2 puffs into the lungs in the morning and at bedtime. Do not eat or drink 30 minutes after inhalation (Patient not taking: Reported on 02/22/2022) 1 Inhaler 12 Not Taking  ? pantoprazole (PROTONIX) 40 MG tablet TAKE 1 TABLET BY MOUTH TWICE A DAY (Patient not taking: Reported on 02/22/2022) 180 tablet 1 Not Taking  ? ? ?Assessment: 61 y.o. male,with new onset atrial fibrillation likely secondary to sepsis. No anticoagulation prior to admission.  ? ?Heparin level 0.18 and subtherapeutic on 1450 units/h.  Hgb and platelets remain stable. No signs of bleeding noted, no IV site issues  noted.  ? ?Goal of Therapy:  ?Heparin level 0.3-0.7 units/ml ?Monitor platelets by anticoagulation protocol: Yes ?  ?Plan:  ?Increase IV heparin infusion to 1700 units/hr ?6h Heparin level ?Daily heparin level, CBC ?Monitor for signs and symptoms of bleeding ? ?Thank you for involving pharmacy in this patient's care. ? ?Elita Quick, PharmD ?PGY1 Ambulatory Care Pharmacy Resident ?02/23/2022 1:09 PM ? ?**Pharmacist phone directory can be found on [email or web address] listed under [place]** ? ? ? ?

## 2022-02-23 NOTE — Progress Notes (Signed)
This RN has titrated the epinephrine drip down from 20 mcg/min down to 11 mcg/min gradually during this shift. The IV pump disassociated at approximate 0845 this morning but after multiple attempts this RN could not get the pump re-associated so inaccurate I/O regarding epinephrine drip. Decrease in drip rate reported to the night shift RN and informed her of the inaccurate pump data.  ?

## 2022-02-23 NOTE — Procedures (Signed)
Intubation Procedure Note ? ?Jeffrey Ayala  ?[id number]  ?November 14, 1961 ? ?Date:02/23/22  ?Time:2:39 AM  ? ?Provider Performing:Gianfranco Araki A Sennie Borden  ? ? ?Procedure: Intubation (82500) ? ?Indication(s) ?Respiratory Failure ? ?Consent ?Risks of the procedure as well as the alternatives and risks of each were explained to the patient and/or caregiver.  Consent for the procedure was obtained and is signed in the bedside chart ? ? ?Anesthesia ?Etomidate and Rocuronium ? ? ?Time Out ?Verified patient identification, verified procedure, site/side was marked, verified correct patient position, special equipment/implants available, medications/allergies/relevant history reviewed, required imaging and test results available. ? ? ?Sterile Technique ?Usual hand hygeine, masks, and gloves were used ? ? ?Procedure Description ?Patient positioned in bed supine.  Sedation given as noted above.  Patient was intubated with endotracheal tube using Glidescope.  View was Grade 2 only posterior commissure .  Number of attempts was 1.  Colorimetric CO2 detector was consistent with tracheal placement. ? ? ?Complications/Tolerance ?None; patient tolerated the procedure well. ?Chest X-ray is ordered to verify placement. ? ? ?EBL ?none ? ? ?Specimen(s) ?None ? ?

## 2022-02-23 NOTE — Progress Notes (Signed)
Critical ABG results given to CCM at bedside.  ?

## 2022-02-23 NOTE — Progress Notes (Signed)
ANTICOAGULATION CONSULT NOTE - Initial Consult ? ?Pharmacy Consult for heparin ?Indication: atrial fibrillation ? ?Allergies  ?Allergen Reactions  ? Codeine Hives  ?  Insomnia, jittery  ? Flomax [Tamsulosin Hcl] Nausea Only  ?  Nausea and fatigue  ? ? ?Patient Measurements: ?Height: '5\' 10"'$  (177.8 cm) ?Weight: 104.3 kg (230 lb) ?IBW/kg (Calculated) : 73 ?Heparin Dosing Weight: 95 kg ? ?Vital Signs: ?Temp: 98.4 ?F (36.9 ?C) (04/03 0000) ?Temp Source: Axillary (04/03 0000) ?BP: 106/64 (04/02 2315) ?Pulse Rate: 55 (04/02 2315) ? ?Labs: ?Recent Labs  ?  02/22/22 ?1805 02/22/22 ?1819 02/22/22 ?2007  ?HGB 13.1 13.3  --   ?HCT 40.6 39.0  --   ?PLT 193  --   --   ?LABPROT 16.0*  --   --   ?INR 1.3*  --   --   ?CREATININE 1.44* 1.20  --   ?TROPONINIHS 525*  --  1,834*  ? ? ?Estimated Creatinine Clearance: 79.2 mL/min (by C-G formula based on SCr of 1.2 mg/dL). ? ? ?Medical History: ?Past Medical History:  ?Diagnosis Date  ? Concussion   ? Eosinophilic esophagitis   ? Gastric ulcer   ? History of kidney stones   ? Migraine   ? ? ?Medications:  ?Medications Prior to Admission  ?Medication Sig Dispense Refill Last Dose  ? acetaminophen (TYLENOL) 650 MG CR tablet Take 650 mg by mouth every 8 (eight) hours as needed for pain or fever.   02/22/2022  ? Calcium Carbonate-Vitamin D (OSCAL 500 D-3 PO) Take 1 tablet by mouth daily.   Past Week  ? naproxen sodium (ALEVE) 220 MG tablet Take 220 mg by mouth daily as needed (pain.).   02/22/2022  ? OVER THE COUNTER MEDICATION Take 1 capsule by mouth daily. Prostate vitamin   Past Week  ? allopurinol (ZYLOPRIM) 100 MG tablet Take 1 tablet (100 mg total) by mouth daily. (Patient not taking: Reported on 02/22/2022) 30 tablet 11 Not Taking  ? baclofen (LIORESAL) 10 MG tablet TAKE 1 TABLET BY MOUTH THREE TIMES A DAY AS NEEDED (Patient not taking: Reported on 02/22/2022) 90 tablet 0 Not Taking  ? colchicine-probenecid 0.5-500 MG tablet Take 1 tablet by mouth 2 (two) times daily. (Patient not taking:  Reported on 02/22/2022) 30 tablet 1 Not Taking  ? fluticasone (FLOVENT HFA) 220 MCG/ACT inhaler Inhale 2 puffs into the lungs in the morning and at bedtime. Do not eat or drink 30 minutes after inhalation (Patient not taking: Reported on 02/22/2022) 1 Inhaler 12 Not Taking  ? pantoprazole (PROTONIX) 40 MG tablet TAKE 1 TABLET BY MOUTH TWICE A DAY (Patient not taking: Reported on 02/22/2022) 180 tablet 1 Not Taking  ? ? ?Assessment: 61 y.o. male,with new onset atrial fibrillation likely secondary to sepsis. No anticoagulation prior to admission. Baseline CBC WNL. ? ?Goal of Therapy:  ?Heparin level 0.3-0.7 units/ml ?Monitor platelets by anticoagulation protocol: Yes ?  ?Plan:  ?Give 4000 units bolus x 1 ?Start heparin infusion at 1450 units/hr ?Check anti-Xa level in 6 hours and daily while on heparin ?Continue to monitor H&H and platelets ? ?Jodean Lima Leilynn Pilat ?02/23/2022,1:11 AM ? ? ?

## 2022-02-23 NOTE — Progress Notes (Addendum)
Following placement of central venous access and obtaining chest x-ray showing venous access in an adequate position ? ?Patient's blood pressure started to trend downwards ?-On Levophed which was gradually increased, added vasopressin and subsequently Neo-Synephrine was requested but was not started ? ?Because of his altered mentation, decision was made to intubate him ? ?With patient not having significant response, 0.4 mg of Romazicon was given ? ?-He became agitated with the Romazicon ?-Blood pressure was still borderline ? ?Intubation procedure was achieved with 20 mg etomidate and 40 of rocuronium ?-Only 1 attempt ?-Successfully intubated and verified ? ?Patient deteriorated and became pulseless a few minutes after intubation ? ?-Had to be resuscitated with 1 amp of epi and an amp of bicarb given ? ?Blood pressure was in the 200s, pressors were weaned down ? ?Blood pressure started to trend down again and pressors were again increased ? ?Received 0.5 of epinephrine and 1 amp of bicarb ? ?Arterial blood gas revealed respiratory acidosis ?pH of 7.09  ? ?-Ventilator changes made ?- 1 ampoule of bicarb given ? ?100 mg of hydrocortisone given in the context of sepsis and requirement for multiple pressors ? ?Anticoagulation initiated for non-ST elevation MI ? ? ?Spouse was updated regarding events ? ?Has at this documentation, remains on vasopressin, Levophed ?Not on any sedation at present-probably still has residual effect of rocuronium ? ?We will place Foley catheter ? ?Follow arterial blood gases ?Obtain labs ? ?The patient is critically ill with multiple organ systems failure and requires high complexity decision making for assessment and support, frequent evaluation and titration of therapies, application of advanced monitoring technologies and extensive interpretation of multiple databases. Critical Care Time devoted to patient care services described in this note independent of APP/resident time (if  applicable)  is 85 minutes.  ? ?Sherrilyn Rist MD ?[facility] ?Personal pager: See Shea Evans ?If unanswered, please page ?CCM On-call: [phone] ?

## 2022-02-23 NOTE — Progress Notes (Signed)
Pharmacy Antibiotic Note ? ?Jeffrey Ayala is a 61 y.o. male admitted on 02/22/2022 presenting with N/V/D, generalized weakness, groin rash/pain, concern for sepsis .  Pharmacy has been consulted for cefepime and vancomycin dosing. ? ?Patient's renal function worsened in the AM with Scr increasing to 2.4. Will need to monitor random vancomycin levels for further dosing. WBC elevated at 17, patient febrile with temp in 101.71F.  ? ?Plan: ?Decrease Cefepime 2g IV q 24h ?Vancomycin level in the AM ?Vancomycin dosed per random levels  ?Monitor renal function, Cx and clinical progression to narrow ?Vancomycin levels as needed ? ?Height: '5\' 10"'$  (177.8 cm) ?Weight: 104.3 kg (230 lb) ?IBW/kg (Calculated) : 73 ? ?Temp (24hrs), Avg:99.2 ?F (37.3 ?C), Min:97.3 ?F (36.3 ?C), Max:102.9 ?F (39.4 ?C) ? ?Recent Labs  ?Lab 02/22/22 ?1805 02/22/22 ?1819 02/22/22 ?2007 02/23/22 ?4270  ?WBC 2.3*  --   --  17.6*  ?CREATININE 1.44* 1.20  --  2.61*  ?LATICACIDVEN 4.3*  --  5.7* >9.0*  ?  ?Estimated Creatinine Clearance: 36.4 mL/min (A) (by C-G formula based on SCr of 2.61 mg/dL (H)).   ? ?Allergies  ?Allergen Reactions  ? Codeine Hives  ?  Insomnia, jittery  ? Flomax [Tamsulosin Hcl] Nausea Only  ?  Nausea and fatigue  ? ? ?Antimicrobials this admission: ?4/2 vanc >>  ?4/2 cefepime >>  ?4/2 metronidazole x1 ? ?Dose adjustments this admission: ?Vanc 750 mg q 24h > vanc per random ?Cefepime q8h > cefepime q 24h ? ?Microbiology results: ?4/1 BCx: ngtd ?4/2 UCx: pending  ?4/3 MRSA PCR: negative ? ?Thank you for involving pharmacy in this patient's care. ? ?Elita Quick, PharmD ?PGY1 Ambulatory Care Pharmacy Resident ?02/23/2022 12:43 PM ? ?**Pharmacist phone directory can be found on [email or web address] listed under [place]** ?

## 2022-02-23 NOTE — Progress Notes (Signed)
Patient agitated and confused ? ?Required increased doses of Precedex ? ?Versed 2 mg, repeat 2 mg making 4 mg total  ? ?BiPAP changes made increased to 15/8, FiO2 60% ? ? ?

## 2022-02-23 NOTE — Procedures (Signed)
Central Venous Catheter Insertion Procedure Note ? ?Jeffrey Ayala  ?[id number]  ?26-Nov-1960 ? ?Date:02/23/22  ?Time:12:54 AM  ? ?Provider Performing:Anderson Coppock A Ramie Palladino  ? ?Procedure: Insertion of Non-tunneled Central Venous Catheter(36556) with US guidance (17616)  ? ?Indication(s) ?Medication administration and Difficult access ? ?Consent ?Risks of the procedure as well as the alternatives and risks of each were explained to the patient and/or caregiver.  Consent for the procedure was obtained and is signed in the bedside chart ? ?Anesthesia ?Topical only with 1% lidocaine  ? ?Timeout ?Verified patient identification, verified procedure, site/side was marked, verified correct patient position, special equipment/implants available, medications/allergies/relevant history reviewed, required imaging and test results available. ? ?Sterile Technique ?Maximal sterile technique including full sterile barrier drape, hand hygiene, sterile gown, sterile gloves, mask, hair covering, sterile ultrasound probe cover (if used). ? ?Procedure Description ?Area of catheter insertion was cleaned with chlorhexidine and draped in sterile fashion.  With real-time ultrasound guidance a central venous catheter was placed into the right internal jugular vein. Nonpulsatile blood flow and easy flushing noted in all ports.  The catheter was sutured in place and sterile dressing applied. ? ?Complications/Tolerance ?None; patient tolerated the procedure well. ?Chest X-ray is ordered to verify placement for internal jugular or subclavian cannulation.   Chest x-ray is not ordered for femoral cannulation. ? ?EBL ?Minimal ? ?Specimen(s) ?None ? ? ?

## 2022-02-23 NOTE — Progress Notes (Signed)
Levo at 29mg per verbal dr. oMontine Circle(Ander Slade.  ?

## 2022-02-23 NOTE — Progress Notes (Signed)
Patient ID: Jeffrey Ayala, male   DOB: 03/24/1961, 61 y.o.   MRN: [id number] ? ?  ?Subjective: ?Pt intubated and sedated.  On norepinephrine with SBPs in 90s right now.  UA obtained via catheterization last night with pyuria and hematuria.  Minimal bacteria and some contamination. ? ?Objective: ?Vital signs in last 24 hours: ?Temp:  [97.3 ?F (36.3 ?C)-102.9 ?F (39.4 ?C)] 99.7 ?F (37.6 ?C) (04/03 0700) ?Pulse Rate:  [46-158] 130 (04/03 1315) ?Resp:  [9-36] 24 (04/03 1315) ?BP: (47-229)/(39-126) 99/64 (04/03 0700) ?SpO2:  [89 %-100 %] 99 % (04/03 1315) ?Arterial Line BP: (107-140)/(60-68) 107/60 (04/03 0700) ?FiO2 (%):  [60 %-100 %] 60 % (04/03 1315) ?Weight:  [104.3 kg] 104.3 kg (04/02 1820) ? ?Intake/Output from previous day: ?04/02 0701 - 04/03 0700 ?In: 5611.8 [I.V.:1663.6; IV Piggyback:3948.2] ?Out: 60 [Urine:60] ?Intake/Output this shift: ?No intake/output data recorded. ? ?Physical Exam:  ?GU: Mild increase in scrotal edema.  Now with increased ecchymotic change over anterior scrotum and clearer demarcation.  There remains no drainage, fluctuance, crepitus, or other clear signs of abscess or necrotizing infection.    ? ?Lab Results: ?Recent Labs  ?  02/23/22 ?0225 02/23/22 ?3546 02/23/22 ?0442  ?HGB 12.6* 12.5* 13.6  ?HCT 37.0* 38.8* 40.0  ? ?BMET ?Recent Labs  ?  02/23/22 ?5681 02/23/22 ?2751 02/23/22 ?1208  ?NA 137 139 138  ?K 2.9* 3.4* 3.2*  ?CL 104  --  104  ?CO2 15*  --  15*  ?GLUCOSE 191*  --  245*  ?BUN 21*  --  29*  ?CREATININE 2.61*  --  3.46*  ?CALCIUM 7.2*  --  7.4*  ? ? ? ?Studies/Results: ? ? ?Assessment/Plan: ?1) Scrotal ecchymosis/edema:  Exam is definitely changed with increased area of ecchymosis and more clearly demarcated.  However, there are no signs on exam to suggest infectious problem such as necrotizing infection.  This could be related to scrotal hematoma that has progressed now on anticoagulation but no clear etiology for hematoma and typically would not be so demarcated.  At this  time, I still think the likelihood that this is the source of his very severe sepsis would be low and he would be at high risk to take to the OR based on hemodynamic instability and anticoagulation.  However, would recommend repeat CT imaging of pelvis tonight or in the morning through upper thighs if no other clear source is identified and patient is not significantly improving to ensure there are no imaging findings to such gas formation, etc.  Will re-examine patient in the morning and if his exam for clearly demonstrates infection, will consider surgical intervention as a possibility but currently surgical management of scrotal findings would seem to have much more risk than benefit. ?2) Hematuria/pyuria: May be related to non-obstructing stones.  Culture pending and is adequately covered for possible UTI with broad spectrum antibiotics. ? ? ? ? LOS: 1 day  ? ?[name] ?02/23/2022, 5:18 PM ? ?  ?

## 2022-02-23 NOTE — Progress Notes (Signed)
? ?NAME:  JONTE SHILLER, MRN:  [id number], DOB:  [birth year], LOS: 1 ?ADMISSION DATE:  02/22/2022, CONSULTATION DATE:  4/2 ?REFERRING MD:  Doren Custard- ED CHIEF COMPLAINT: Septic shock ? ?History of Present Illness:  ?JOSHAU CODE, is a 61 y.o. male, who presented to the [facility] ED with a chief complaint of nausea, vomiting, diarrhea. ? ?They have a pertinent past medical history of gastric ulcer, kidney stones, eosinophilic esophagitis, migraines  ? ?History obtained from Bora Broner (spouse), on 4/1 patient began complaining of feeling unwell. Wife denies sick contacts, questionable food.  Wife denies taking any meds currently.  Around 2 PM on 4/2 the patient developed nausea, vomiting, and diarrhea while at a birthday party and soiled himself. EMS was called. When EMS arrived he was found to be in A-fib with a rate of 120-160. ? ?At the ED he was found to be febrile with a Tmax of 102.9.  A code sepsis was called in the emergency department, blood cultures were drawn, 2 L of IV fluid were given, vanc and cefepime were ordered.  CT chest negative for acute process.  CT abdomen pelvis with and without contrast notable for swollen scrotum concerning for infection, colon filled with diarrhea.  Urology and cardiology were consulted by the ED.  Despite fluid resuscitation the patient developed hypotension requiring vasopressors. ? ?PCCM was consulted for admission. ? ?Pertinent  Medical History  ?gastric ulcer, kidney stones, eosinophilic esophagitis, migraines  ? ?Significant Hospital Events: ?Including procedures, antibiotic start and stop dates in addition to other pertinent events   ?4/2 Presented to the ED. CT chest negative for acute process.  CT abdomen pelvis with and without contrast notable for swollen scrotum concerning for infection, colon filled with diarrhea.  Urology and cardiology were consulted by the ED. Vanc, cefepime started.  ?4/3 Intubated overnight, brief cardiac arrest. Escalating pressors. ? ?Interim  History / Subjective:  ?Overnight developed agitation, worsening shock, respiratory failure requiring intubation.  Brief cardiac arrest postintubation.  Very high pressor requirements today. ? ?Objective   ?Blood pressure 99/64, pulse (!) 119, temperature 99.7 ?F (37.6 ?C), resp. rate 19, height '5\' 10"'$  (1.778 m), weight 104.3 kg, SpO2 100 %.  ?   ?Vent Mode: PRVC ?FiO2 (%):  [100 %] 100 % ?Set Rate:  [22 bmp] 22 bmp ?Vt Set:  [600 mL] 600 mL ?PEEP:  [15 cmH20] 15 cmH20  ? ?Intake/Output Summary (Last 24 hours) at 02/23/2022 0747 ?Last data filed at 02/23/2022 0700 ?Gross per 24 hour  ?Intake 5611.83 ml  ?Output 60 ml  ?Net 5551.83 ml  ? ? ?Filed Weights  ? 02/22/22 1820  ?Weight: 104.3 kg  ? ? ?Examination: ?General: Critically ill-appearing man lying in bed intubated, sedated ?HEENT: [place]/AT, eyes anicteric ?Neuro: Pinpoint pupils, RASS -4, moving spontaneously but keeps his eyes closed.  Not following commands. ?CV: S1-S2, tachycardic, irregular rhythm ?PULM: Synchronous with mechanical ventilation, peak pressure 22.  CTA B. ?GI: Distended, soft ?Extremities: No significant extremity edema.  Left lower extremity TKA incision with scab at the top, but no surrounding erythema or fluctuance.  No grimacing or signs of pain to passive range of motion of the left knee. ?Skin: Scrotal edema, significantly discolored hemorrhagic appearing lesion in the central part of the scrotum, sparing the far lateral portions. ? ? ?Blood culture> pending ? ?Scrotal US> bilateral hydrocele, R>L, debris in R hydrocele. Scrotal wall thickening, no abscess. ? ?CT chest personally reviewed>tiny dependent pleural effusions, no airspace disease.  ? ?Ct abd pelvis  personally reviewed> nephrolithiasis, renal atrophy and scarring ? ?Resolved Hospital Problem list   ? ? ?Assessment & Plan:  ?Septic shock, no clear etiology at this time. UA with high WBC and infected appearing scrotum raise concern for urinary source. Also had diarrhea the day of  admission and liquid stool on CT> C diff negative. Has LLE TKA, but no recent pain or issues with it.  ?Lactic acidosis due to sepsis ?Abdominal pain, n/v/d likely related to sepsis  ?-volume resuscitated with LR at admission, ongoing maintenance fluids. Now 5.5L positive. ?-con't flagyl, vanc, cefepime  ?-adding stress dose steroids ?-trial of methylene blue ?-urine culture added to UA specimen ?-blood cultures pending ?-goal MAP >65; adding neosynephrine ?-Appreciate Urology's management ? ?New onset A. Fib, suspect secondary to sepsis, TSH 0.68, no history of A-fib ?Troponin & BNP elevation> suspect related to sepsis ?-echo pending ?-con't heparin for now ?-too sick for invasive evaluation currently. ? ?Acute hypoxic respiratory failure requiring mechanical ventilation ?- Low tidal volume ventilation, 4 to 8 cc/kg ideal body weight with goal plateaus and 30 driving pressure less than 15. ?- VAP prevention protocol ?- PAD protocol for sedation.  Switching to ketamine due to profound hypotension. ?- Daily SAT and SBT as appropriate. ? ?AKI, suspect secondary to sepsis ?-renally dose meds, avoid nephrotoxic meds ?-bicarb gtt ?-maintain adequate perfusion ?-strict I/Os ?-monitor ?-discussed that he may progress to require RRT with family due to severity of his sepsis  ? ?Acute metabolic encephalopathy due to sepsis ?-PAD protocol ? ?Hyperglycemia ?-SSI PRN ?-goal BG 140- 180 ? ?Leukopenia, secondary to sepsis ?-serial CBCs ? ?Acute electrolyte abnormality- hypokalemia, hypomagnesia, hypocalcemia ?-replete electrolytes, monitor ?-repeat labs this afternoon ? ?History of gastric ulcer ?-con't PPI ? ?Splenomegaly seen on CT, chronic since 2015 ?-OP follow up ? ?Wife and son updated at bedside-- they understand how severely ill he is and that he may continue to worsen before improvement. ? ?Best Practice (right click and "Reselect all SmartList Selections" daily)  ? ?Diet/type: NPO ?DVT prophylaxis: systemic heparin ?GI  prophylaxis: PPI ?Lines: Central line, Arterial Line, and yes and it is still needed ?Foley:  Yes, and it is still needed ?Code Status:  full code ?Last date of multidisciplinary goals of care discussion [4/2 Discussed with wife Jameson Tormey at bedside.  Full scope of care.  Once CPR and intubation if needed] ? ?Labs   ?CBC: ?Recent Labs  ?Lab 02/22/22 ?1805 02/22/22 ?1819 02/23/22 ?0225 02/23/22 ?1478 02/23/22 ?0442  ?WBC 2.3*  --   --  17.6*  --   ?NEUTROABS 2.2  --   --   --   --   ?HGB 13.1 13.3 12.6* 12.5* 13.6  ?HCT 40.6 39.0 37.0* 38.8* 40.0  ?MCV 92.5  --   --  93.9  --   ?PLT 193  --   --  163  --   ? ? ? ?Basic Metabolic Panel: ?Recent Labs  ?Lab 02/22/22 ?1805 02/22/22 ?1819 02/22/22 ?2007 02/23/22 ?0225 02/23/22 ?2956 02/23/22 ?0442  ?NA 136 140  --  141 137 139  ?K 3.1* 3.2*  --  3.1* 2.9* 3.4*  ?CL 105 104  --   --  104  --   ?CO2 20*  --   --   --  15*  --   ?GLUCOSE 128* 122*  --   --  191*  --   ?BUN 14 16  --   --  21*  --   ?CREATININE 1.44* 1.20  --   --  2.61*  --   ?CALCIUM 8.6*  --   --   --  7.2*  --   ?MG  --   --  1.1*  --  1.7  --   ?PHOS  --   --   --   --  7.0*  --   ? ? ?GFR: ?Estimated Creatinine Clearance: 36.4 mL/min (A) (by C-G formula based on SCr of 2.61 mg/dL (H)). ?Recent Labs  ?Lab 02/22/22 ?1805 02/22/22 ?2007 02/23/22 ?5784  ?WBC 2.3*  --  17.6*  ?LATICACIDVEN 4.3* 5.7* >9.0*  ? ? ? ?Liver Function Tests: ?Recent Labs  ?Lab 02/22/22 ?6962  ?AST 42*  ?ALT 33  ?ALKPHOS 99  ?BILITOT 1.8*  ?PROT 6.3*  ?ALBUMIN 3.8  ? ? ?No results for input(s): LIPASE, AMYLASE in the last 168 hours. ?No results for input(s): AMMONIA in the last 168 hours. ? ?ABG ?   ?Component Value Date/Time  ? PHART 7.121 (LL) 02/23/2022 0442  ? PCO2ART 45.0 02/23/2022 0442  ? PO2ART 223 (H) 02/23/2022 0442  ? HCO3 14.6 (L) 02/23/2022 0442  ? TCO2 16 (L) 02/23/2022 0442  ? ACIDBASEDEF 14.0 (H) 02/23/2022 0442  ? O2SAT 100 02/23/2022 0442  ? ?  ? ?Coagulation Profile: ?Recent Labs  ?Lab 02/22/22 ?9528  ?INR 1.3*   ? ? ?This patient is critically ill with multiple organ system failure which requires frequent high complexity decision making, assessment, support, evaluation, and titration of therapies. This was comp

## 2022-02-23 NOTE — Progress Notes (Signed)
ANTICOAGULATION CONSULT NOTE ? ?Pharmacy Consult for heparin ?Indication: atrial fibrillation ? ?Allergies  ?Allergen Reactions  ? Codeine Hives  ?  Insomnia, jittery  ? Flomax [Tamsulosin Hcl] Nausea Only  ?  Nausea and fatigue  ? ? ?Patient Measurements: ?Height: '5\' 10"'$  (177.8 cm) ?Weight: 104.3 kg (230 lb) ?IBW/kg (Calculated) : 73 ?Heparin Dosing Weight: 95 kg ? ?Vital Signs: ?Temp: 100.8 ?F (38.2 ?C) (04/03 2032) ?Pulse Rate: 132 (04/03 2032) ? ?Labs: ?Recent Labs  ?  02/22/22 ?1805 02/22/22 ?1819 02/22/22 ?2007 02/23/22 ?0225 02/23/22 ?6644 02/23/22 ?0347 02/23/22 ?1208 02/23/22 ?1846 02/23/22 ?2155  ?HGB 13.1   < >  --  12.6* 12.5* 13.6  --   --   --   ?HCT 40.6   < >  --  37.0* 38.8* 40.0  --   --   --   ?PLT 193  --   --   --  163  --   --   --   --   ?LABPROT 16.0*  --   --   --   --   --   --   --   --   ?INR 1.3*  --   --   --   --   --   --   --   --   ?HEPARINUNFRC  --   --   --   --   --   --  0.18*  --  0.23*  ?CREATININE 1.44*   < >  --   --  2.61*  --  3.46* 3.43*  --   ?TROPONINIHS 525*  --  1,834*  --  2,807*  --  3,550*  --   --   ? < > = values in this interval not displayed.  ? ? ? ?Estimated Creatinine Clearance: 27.7 mL/min (A) (by C-G formula based on SCr of 3.43 mg/dL (H)). ? ? ?Medical History: ?Past Medical History:  ?Diagnosis Date  ? Concussion   ? Eosinophilic esophagitis   ? Gastric ulcer   ? History of kidney stones   ? Migraine   ? ? ?Medications:  ?Medications Prior to Admission  ?Medication Sig Dispense Refill Last Dose  ? acetaminophen (TYLENOL) 650 MG CR tablet Take 650 mg by mouth every 8 (eight) hours as needed for pain or fever.   02/22/2022  ? Calcium Carbonate-Vitamin D (OSCAL 500 D-3 PO) Take 1 tablet by mouth daily.   Past Week  ? naproxen sodium (ALEVE) 220 MG tablet Take 220 mg by mouth daily as needed (pain.).   02/22/2022  ? OVER THE COUNTER MEDICATION Take 1 capsule by mouth daily. Prostate vitamin   Past Week  ? allopurinol (ZYLOPRIM) 100 MG tablet Take 1 tablet (100  mg total) by mouth daily. (Patient not taking: Reported on 02/22/2022) 30 tablet 11 Not Taking  ? baclofen (LIORESAL) 10 MG tablet TAKE 1 TABLET BY MOUTH THREE TIMES A DAY AS NEEDED (Patient not taking: Reported on 02/22/2022) 90 tablet 0 Not Taking  ? colchicine-probenecid 0.5-500 MG tablet Take 1 tablet by mouth 2 (two) times daily. (Patient not taking: Reported on 02/22/2022) 30 tablet 1 Not Taking  ? fluticasone (FLOVENT HFA) 220 MCG/ACT inhaler Inhale 2 puffs into the lungs in the morning and at bedtime. Do not eat or drink 30 minutes after inhalation (Patient not taking: Reported on 02/22/2022) 1 Inhaler 12 Not Taking  ? pantoprazole (PROTONIX) 40 MG tablet TAKE 1 TABLET BY MOUTH TWICE A DAY (Patient not taking: Reported  on 02/22/2022) 180 tablet 1 Not Taking  ? ? ?Assessment: 61 y.o. male,with new onset atrial fibrillation likely secondary to sepsis. No anticoagulation prior to admission. Urology seeing for hematuria ? ?Heparin level up to 0.23 on 1700 units/h.   ? ?Goal of Therapy:  ?Heparin level 0.3-0.7 units/ml ?Monitor platelets by anticoagulation protocol: Yes ?  ?Plan:  ?Increase IV heparin infusion to 1900 units/hr ?Heparin level and CBC in am ? ?Hildred Laser, PharmD ?Clinical Pharmacist ?**Pharmacist phone directory can now be found on [email or web address] ([email or web address]).  Listed under [email or web address]. ? ? ? ? ? ?

## 2022-02-24 ENCOUNTER — Inpatient Hospital Stay (HOSPITAL_COMMUNITY): Payer: BC Managed Care – PPO

## 2022-02-24 DIAGNOSIS — R009 Unspecified abnormalities of heart beat: Secondary | ICD-10-CM

## 2022-02-24 DIAGNOSIS — Z9911 Dependence on respirator [ventilator] status: Secondary | ICD-10-CM | POA: Diagnosis not present

## 2022-02-24 DIAGNOSIS — A419 Sepsis, unspecified organism: Secondary | ICD-10-CM | POA: Diagnosis not present

## 2022-02-24 DIAGNOSIS — R6521 Severe sepsis with septic shock: Secondary | ICD-10-CM | POA: Diagnosis not present

## 2022-02-24 LAB — DIC (DISSEMINATED INTRAVASCULAR COAGULATION)PANEL
D-Dimer, Quant: >20 ug/mL-FEU — ABNORMAL HIGH (ref 0.00–0.50)
Fibrinogen: 565 mg/dL — ABNORMAL HIGH (ref 210–475)
INR: 2.2 — ABNORMAL HIGH (ref 0.8–1.2)
Platelets: 77 10*3/uL — ABNORMAL LOW (ref 150–400)
Prothrombin Time: 24.3 seconds — ABNORMAL HIGH (ref 11.4–15.2)
Smear Review: NONE SEEN
aPTT: >200 seconds (ref 24–36)

## 2022-02-24 LAB — CBC
HCT: 33.1 % — ABNORMAL LOW (ref 39.0–52.0)
Hemoglobin: 11.4 g/dL — ABNORMAL LOW (ref 13.0–17.0)
MCH: 30.6 pg (ref 26.0–34.0)
MCHC: 34.4 g/dL (ref 30.0–36.0)
MCV: 88.7 fL (ref 80.0–100.0)
Platelets: 82 10*3/uL — ABNORMAL LOW (ref 150–400)
RBC: 3.73 MIL/uL — ABNORMAL LOW (ref 4.22–5.81)
RDW: 17 % — ABNORMAL HIGH (ref 11.5–15.5)
WBC: 19.7 10*3/uL — ABNORMAL HIGH (ref 4.0–10.5)
nRBC: 0 % (ref 0.0–0.2)

## 2022-02-24 LAB — COMPREHENSIVE METABOLIC PANEL
ALT: 53 U/L — ABNORMAL HIGH (ref 0–44)
AST: 88 U/L — ABNORMAL HIGH (ref 15–41)
Albumin: 2.6 g/dL — ABNORMAL LOW (ref 3.5–5.0)
Alkaline Phosphatase: 68 U/L (ref 38–126)
Anion gap: 18 — ABNORMAL HIGH (ref 5–15)
BUN: 45 mg/dL — ABNORMAL HIGH (ref 6–20)
CO2: 19 mmol/L — ABNORMAL LOW (ref 22–32)
Calcium: 6.7 mg/dL — ABNORMAL LOW (ref 8.9–10.3)
Chloride: 103 mmol/L (ref 98–111)
Creatinine, Ser: 4.08 mg/dL — ABNORMAL HIGH (ref 0.61–1.24)
GFR, Estimated: 16 mL/min — ABNORMAL LOW (ref >60)
Glucose, Bld: 117 mg/dL — ABNORMAL HIGH (ref 70–99)
Potassium: 4 mmol/L (ref 3.5–5.1)
Sodium: 140 mmol/L (ref 135–145)
Total Bilirubin: 7.7 mg/dL — ABNORMAL HIGH (ref 0.3–1.2)
Total Protein: 4.9 g/dL — ABNORMAL LOW (ref 6.5–8.1)

## 2022-02-24 LAB — HEMOGLOBIN A1C
Hgb A1c MFr Bld: 5.5 % (ref 4.8–5.6)
Mean Plasma Glucose: 111 mg/dL

## 2022-02-24 LAB — LACTIC ACID, PLASMA
Lactic Acid, Venous: 3.6 mmol/L (ref 0.5–1.9)
Lactic Acid, Venous: 3.9 mmol/L (ref 0.5–1.9)

## 2022-02-24 LAB — GLUCOSE, CAPILLARY
Glucose-Capillary: 101 mg/dL — ABNORMAL HIGH (ref 70–99)
Glucose-Capillary: 106 mg/dL — ABNORMAL HIGH (ref 70–99)
Glucose-Capillary: 111 mg/dL — ABNORMAL HIGH (ref 70–99)
Glucose-Capillary: 116 mg/dL — ABNORMAL HIGH (ref 70–99)
Glucose-Capillary: 125 mg/dL — ABNORMAL HIGH (ref 70–99)
Glucose-Capillary: 125 mg/dL — ABNORMAL HIGH (ref 70–99)

## 2022-02-24 LAB — BASIC METABOLIC PANEL
Anion gap: 20 — ABNORMAL HIGH (ref 5–15)
BUN: 60 mg/dL — ABNORMAL HIGH (ref 6–20)
CO2: 19 mmol/L — ABNORMAL LOW (ref 22–32)
Calcium: 6.6 mg/dL — ABNORMAL LOW (ref 8.9–10.3)
Chloride: 101 mmol/L (ref 98–111)
Creatinine, Ser: 4.84 mg/dL — ABNORMAL HIGH (ref 0.61–1.24)
GFR, Estimated: 13 mL/min — ABNORMAL LOW (ref >60)
Glucose, Bld: 109 mg/dL — ABNORMAL HIGH (ref 70–99)
Potassium: 4.2 mmol/L (ref 3.5–5.1)
Sodium: 140 mmol/L (ref 135–145)

## 2022-02-24 LAB — MAGNESIUM: Magnesium: 1.8 mg/dL (ref 1.7–2.4)

## 2022-02-24 LAB — HEPARIN LEVEL (UNFRACTIONATED)
Heparin Unfractionated: 0.3 IU/mL (ref 0.30–0.70)
Heparin Unfractionated: 0.24 IU/mL — ABNORMAL LOW (ref 0.30–0.70)
Heparin Unfractionated: 0.24 IU/mL — ABNORMAL LOW (ref 0.30–0.70)

## 2022-02-24 LAB — PHOSPHORUS: Phosphorus: 4.9 mg/dL — ABNORMAL HIGH (ref 2.5–4.6)

## 2022-02-24 LAB — URINE CULTURE

## 2022-02-24 LAB — VANCOMYCIN, RANDOM: Vancomycin Rm: 18

## 2022-02-24 LAB — TRIGLYCERIDES: Triglycerides: 197 mg/dL — ABNORMAL HIGH (ref <150)

## 2022-02-24 MED ORDER — VANCOMYCIN HCL IN DEXTROSE 1-5 GM/200ML-% IV SOLN: 1000.0000 mg | Freq: Once | INTRAVENOUS | Status: DC

## 2022-02-24 MED ORDER — VITAL HIGH PROTEIN PO LIQD: 1000.0000 mL | ORAL | Status: DC

## 2022-02-24 MED ORDER — PROSOURCE TF PO LIQD: 45.0000 mL | Freq: Two times a day (BID) | ORAL | Status: DC

## 2022-02-24 MED ORDER — CALCIUM GLUCONATE-NACL 1-0.675 GM/50ML-% IV SOLN
1.0000 g | Freq: Once | INTRAVENOUS | Status: AC
  Administered 2022-02-24: 1000 mg via INTRAVENOUS
  Filled 2022-02-24: qty 50

## 2022-02-24 MED ORDER — VITAL 1.5 CAL PO LIQD
1000.0000 mL | ORAL | Status: DC
  Administered 2022-02-24 – 2022-02-25 (×2): 1000 mL

## 2022-02-24 NOTE — Progress Notes (Signed)
? ?NAME:  Jeffrey Ayala, MRN:  [id number], DOB:  04-06-1961, LOS: 2 ?ADMISSION DATE:  02/22/2022, CONSULTATION DATE:  4/2 ?REFERRING MD:  Doren Custard- ED CHIEF COMPLAINT: Septic shock ? ?History of Present Illness:  ?Jeffrey Ayala, is a 61 y.o. male, who presented to the [facility] ED with a chief complaint of nausea, vomiting, diarrhea. ? ?They have a pertinent past medical history of gastric ulcer, kidney stones, eosinophilic esophagitis, migraines  ? ?History obtained from Stace Peace (spouse), on 4/1 patient began complaining of feeling unwell. Wife denies sick contacts, questionable food.  Wife denies taking any meds currently.  Around 2 PM on 4/2 the patient developed nausea, vomiting, and diarrhea while at a birthday party and soiled himself. EMS was called. When EMS arrived he was found to be in A-fib with a rate of 120-160. ? ?At the ED he was found to be febrile with a Tmax of 102.9.  A code sepsis was called in the emergency department, blood cultures were drawn, 2 L of IV fluid were given, vanc and cefepime were ordered.  CT chest negative for acute process.  CT abdomen pelvis with and without contrast notable for swollen scrotum concerning for infection, colon filled with diarrhea.  Urology and cardiology were consulted by the ED.  Despite fluid resuscitation the patient developed hypotension requiring vasopressors. ? ?PCCM was consulted for admission. ? ?Pertinent  Medical History  ?gastric ulcer, kidney stones, eosinophilic esophagitis, migraines  ? ?Significant Hospital Events: ?Including procedures, antibiotic start and stop dates in addition to other pertinent events   ?4/2 Presented to the ED. CT chest negative for acute process.  CT abdomen pelvis with and without contrast notable for swollen scrotum concerning for infection, colon filled with diarrhea.  Urology and cardiology were consulted by the ED. Vanc, cefepime started.  ?4/3 Intubated overnight, brief cardiac arrest. Escalating pressors. ? ?Interim  History / Subjective:  ?Overnight no acute events. ? ?Objective   ?Blood pressure (!) 104/58, pulse (!) 128, temperature 99.3 ?F (37.4 ?C), temperature source Bladder, resp. rate (!) 28, height '5\' 10"'$  (1.778 m), weight 104.3 kg, SpO2 96 %.  ?   ?Vent Mode: PRVC ?FiO2 (%):  [40 %-70 %] 40 % ?Set Rate:  [22 bmp] 22 bmp ?Vt Set:  [60 mL-600 mL] 600 mL ?PEEP:  [10 cmH20-12 cmH20] 10 cmH20 ?Plateau Pressure:  [19 cmH20-28 cmH20] 19 cmH20  ? ?Intake/Output Summary (Last 24 hours) at 02/24/2022 0751 ?Last data filed at 02/24/2022 [phone] ?Gross per 24 hour  ?Intake 5629.17 ml  ?Output 610 ml  ?Net 5019.17 ml  ? ? ?Filed Weights  ? 02/22/22 1820  ?Weight: 104.3 kg  ? ? ?Examination: ?General: critically ill appearing man lying in bed in NAD ?HEENT: [place]/AT, eyes anicteric ?Neuro: small but reactive pupils, RASS -5 ?CV: S1S2, irreg rhythm ?PULM: synchronous with MV, CTAB, minimal secretions from ETT. Pplat 22 ?GI: soft, NT ?Extremities: Mild edema, severe symmetric mottling of feet to about an inch above ankles.  ?GU: scrotal edema, 2 abrasions with bloody drainage but not frank bleeding. Severe hemorrhagic discoloration of most of scrotum but well-demarcated.  ?Skin: mottling of feet, no diffuse rashes. ? ?Blood culture> NGTD ?Urine culture> multiple species ? ?Bicarb 19 ?BUN 45 ?Creatinine 4.08 ?Calcium 6.7 ?Phos 4.9 ?AST 88 ?ALT 53 ?Bilirubin 7.7 ?WBC 19.7 ?H/H 11.4/33.1 ?Platelets 82 ? ?LA 3.3 ? ?Echo: LVEF 60-65%, mild LVH. RV normal. Dilated IVC with reduced respiratory variability. ? ?Resolved Hospital Problem list   ? ? ?Assessment &  Plan:  ?Septic shock, no clear etiology at this time. UA with high WBC and infected appearing scrotum raise concern for urinary source. Also had diarrhea the day of admission and liquid stool on CT> C diff negative. Has LLE TKA, but no recent pain or issues with it.  ?Lactic acidosis due to sepsis ?Abdominal pain, n/v/d likely related to sepsis  ?-con't broad antibiotics- vanc, flagyl,  cefepime ?-pressors to maintain MAP >65-- still on epi, NE, vasopressin, but coming down. ?-methylene blue ?-stress dose steroids ?-blood cultures pending, urine culture not helpful unfortunately ?-appreciate urology's input-- repeated pelvic CT today ?-RUQ [place] to evaluate for cholecystitis ? ?New onset A. Fib, suspect secondary to sepsis, TSH 0.68, no history of A-fib ?Troponin & BNP elevation> suspect related to sepsis ?-con't heparin ?-not needing rate control at this time ? ?Acute hypoxic respiratory failure requiring mechanical ventilation ?- LTVV, 4-8cc/kg IBW with goal Pplat<30 and DP<15 ?-VAP prevention protocol ?-PAD protocol for sedation- ketamine ?-daily SAT & SBT as appropriate ? ?AKI, suspect secondary to sepsis. No hydronephrosis on imaging. ?hyperphosphatemia ?-renally dose meds, avoid nephrotoxic meds ?-bicarb gtt ?-maintain adequate perfusion, goal MAP >65 ?-strict I/Os ?-con't to monitor, keep foley catheter ?-family aware that if he continues to have progressive renal failure he may require RRT at some point this admission ? ?Elevated transaminases and hyperbilirubinemia; likely due to sepsis ?-check RUQ [place] ? ?Hypocalcemia ?-calcium gluconate ? ?Acute metabolic encephalopathy due to sepsis ?-PAD protocol ? ?Hyperglycemia ?-SSI PRN ?-goal BG 140-180 ? ?Leukopenia, secondary to sepsis> now leukocytosis ?thrombocytopenia ?-con't to monitor ?-DIC panel, but Hb relatively stable ? ?Acute electrolyte abnormality- hypokalemia, hypomagnesia, hypocalcemia ?-replete electrolytes, monitor ?-repeat labs this afternoon ? ?History of gastric ulcer ?-PPI ? ?Splenomegaly seen on CT, chronic since 2015 ?-OP follow up ? ?At risk for malnutrition ?-start trickle TF ? ?Son and wife updated at bedside today.  ? ?Best Practice (right click and "Reselect all SmartList Selections" daily)  ? ?Diet/type: tubefeeds ?DVT prophylaxis: systemic heparin ?GI prophylaxis: PPI ?Lines: Central line, Arterial Line, and yes and it is  still needed ?Foley:  Yes, and it is still needed ?Code Status:  full code ?Last date of multidisciplinary goals of care discussion [4/2 Discussed with wife Jony Ladnier at bedside.  Full scope of care.  Once CPR and intubation if needed] ? ?Labs   ?CBC: ?Recent Labs  ?Lab 02/22/22 ?1805 02/22/22 ?1819 02/23/22 ?0225 02/23/22 ?4098 02/23/22 ?1191 02/24/22 ?4782  ?WBC 2.3*  --   --  17.6*  --  19.7*  ?NEUTROABS 2.2  --   --   --   --   --   ?HGB 13.1 13.3 12.6* 12.5* 13.6 11.4*  ?HCT 40.6 39.0 37.0* 38.8* 40.0 33.1*  ?MCV 92.5  --   --  93.9  --  88.7  ?PLT 193  --   --  163  --  82*  ? ? ? ?Basic Metabolic Panel: ?Recent Labs  ?Lab 02/22/22 ?1805 02/22/22 ?1819 02/22/22 ?2007 02/23/22 ?0225 02/23/22 ?9562 02/23/22 ?1308 02/23/22 ?1208 02/23/22 ?1846 02/24/22 ?6578  ?NA 136 140  --    < > 137 139 138 138 140  ?K 3.1* 3.2*  --    < > 2.9* 3.4* 3.2* 3.7 4.0  ?CL 105 104  --   --  104  --  104 104 103  ?CO2 20*  --   --   --  15*  --  15* 19* 19*  ?GLUCOSE 128* 122*  --   --  191*  --  245* 173* 117*  ?BUN 14 16  --   --  21*  --  29* 36* 45*  ?CREATININE 1.44* 1.20  --   --  2.61*  --  3.46* 3.43* 4.08*  ?CALCIUM 8.6*  --   --   --  7.2*  --  7.4* 7.2* 6.7*  ?MG  --   --  1.1*  --  1.7  --  2.1  --  1.8  ?PHOS  --   --   --   --  7.0*  --   --   --  4.9*  ? < > = values in this interval not displayed.  ? ? ?GFR: ?Estimated Creatinine Clearance: 23.3 mL/min (A) (by C-G formula based on SCr of 4.08 mg/dL (H)). ?Recent Labs  ?Lab 02/22/22 ?1805 02/22/22 ?2007 02/23/22 ?1194 02/23/22 ?1200 02/23/22 ?1846 02/24/22 ?1740 02/24/22 ?8144  ?WBC 2.3*  --  17.6*  --   --   --  19.7*  ?LATICACIDVEN 4.3*   < > >9.0* 7.7* 3.9* 3.6*  --   ? < > = values in this interval not displayed.  ? ? ? ?Liver Function Tests: ?Recent Labs  ?Lab 02/22/22 ?1805 02/24/22 ?8185  ?AST 42* 88*  ?ALT 33 53*  ?ALKPHOS 99 68  ?BILITOT 1.8* 7.7*  ?PROT 6.3* 4.9*  ?ALBUMIN 3.8 2.6*  ? ? ?No results for input(s): LIPASE, AMYLASE in the last 168 hours. ?No  results for input(s): AMMONIA in the last 168 hours. ? ?ABG ?   ?Component Value Date/Time  ? PHART 7.121 (LL) 02/23/2022 0442  ? PCO2ART 45.0 02/23/2022 0442  ? PO2ART 223 (H) 02/23/2022 0442  ? HCO3 14.6 (L)

## 2022-02-24 NOTE — Progress Notes (Signed)
?  Transition of Care (TOC) Screening Note ? ? ?Patient Details  ?Name: Jeffrey Ayala ?Date of Birth: Nov 26, 1960 ? ? ?Transition of Care (TOC) CM/SW Contact:    ?Tom-Johnson, Renea Ee, RN ?Phone Number: ?02/24/2022, 3:59 PM ? ?Patient is admitted for Septic Shock. Currently on vent and drips. ?Transition of Care Department [facility]) has reviewed patient and no TOC needs or recommendations have been identified at this time. TOC will continue to monitor patient advancement through interdisciplinary progression rounds. If new patient transition needs arise, please place a TOC consult. ?  ?

## 2022-02-24 NOTE — Progress Notes (Addendum)
ANTICOAGULATION CONSULT NOTE ? ?Pharmacy Consult for heparin ?Indication: atrial fibrillation ? ?Allergies  ?Allergen Reactions  ? Codeine Hives  ?  Insomnia, jittery  ? Flomax [Tamsulosin Hcl] Nausea Only  ?  Nausea and fatigue  ? ? ?Patient Measurements: ?Height: '5\' 10"'$  (177.8 cm) ?Weight: 104.3 kg (230 lb) ?IBW/kg (Calculated) : 73 ?Heparin Dosing Weight: 95 kg ? ?Vital Signs: ?Temp: 100.9 ?F (38.3 ?C) (04/04 1528) ?Temp Source: Bladder (04/04 4098) ?BP: 126/68 (04/04 1536) ?Pulse Rate: 146 (04/04 1536) ? ?Labs: ?Recent Labs  ?  02/22/22 ?1805 02/22/22 ?1819 02/22/22 ?2007 02/23/22 ?0225 02/23/22 ?1191 02/23/22 ?4782 02/23/22 ?1208 02/23/22 ?1846 02/23/22 ?2155 02/24/22 ?9562 02/24/22 ?1308 02/24/22 ?6578 02/24/22 ?1244 02/24/22 ?1600  ?HGB 13.1   < >  --    < > 12.5* 13.6  --   --   --   --  11.4*  --   --   --   ?HCT 40.6   < >  --    < > 38.8* 40.0  --   --   --   --  33.1*  --   --   --   ?PLT 193  --   --   --  163  --   --   --   --   --  82* 77*  --   --   ?APTT  --   --   --   --   --   --   --   --   --   --   --  >200*  --   --   ?LABPROT 16.0*  --   --   --   --   --   --   --   --   --   --  24.3*  --   --   ?INR 1.3*  --   --   --   --   --   --   --   --   --   --  2.2*  --   --   ?HEPARINUNFRC  --   --   --   --   --   --  0.18*  --    < > 0.30  --   --  0.24* 0.24*  ?CREATININE 1.44*   < >  --   --  2.61*  --  3.46* 3.43*  --  4.08*  --   --   --   --   ?TROPONINIHS 525*  --  1,834*  --  2,807*  --  3,550*  --   --   --   --   --   --   --   ? < > = values in this interval not displayed.  ? ? ? ?Estimated Creatinine Clearance: 23.3 mL/min (A) (by C-G formula based on SCr of 4.08 mg/dL (H)). ? ? ?Medical History: ?Past Medical History:  ?Diagnosis Date  ? Concussion   ? Eosinophilic esophagitis   ? Gastric ulcer   ? History of kidney stones   ? Migraine   ? ? ?Medications:  ?Medications Prior to Admission  ?Medication Sig Dispense Refill Last Dose  ? acetaminophen (TYLENOL) 650 MG CR tablet Take  650 mg by mouth every 8 (eight) hours as needed for pain or fever.   02/22/2022  ? Calcium Carbonate-Vitamin D (OSCAL 500 D-3 PO) Take 1 tablet by mouth daily.   Past Week  ? naproxen sodium (ALEVE) 220 MG tablet Take  220 mg by mouth daily as needed (pain.).   02/22/2022  ? OVER THE COUNTER MEDICATION Take 1 capsule by mouth daily. Prostate vitamin   Past Week  ? allopurinol (ZYLOPRIM) 100 MG tablet Take 1 tablet (100 mg total) by mouth daily. (Patient not taking: Reported on 02/22/2022) 30 tablet 11 Not Taking  ? baclofen (LIORESAL) 10 MG tablet TAKE 1 TABLET BY MOUTH THREE TIMES A DAY AS NEEDED (Patient not taking: Reported on 02/22/2022) 90 tablet 0 Not Taking  ? colchicine-probenecid 0.5-500 MG tablet Take 1 tablet by mouth 2 (two) times daily. (Patient not taking: Reported on 02/22/2022) 30 tablet 1 Not Taking  ? fluticasone (FLOVENT HFA) 220 MCG/ACT inhaler Inhale 2 puffs into the lungs in the morning and at bedtime. Do not eat or drink 30 minutes after inhalation (Patient not taking: Reported on 02/22/2022) 1 Inhaler 12 Not Taking  ? pantoprazole (PROTONIX) 40 MG tablet TAKE 1 TABLET BY MOUTH TWICE A DAY (Patient not taking: Reported on 02/22/2022) 180 tablet 1 Not Taking  ? ? ?Assessment: 61 y.o. male,with new onset atrial fibrillation likely secondary to sepsis. No anticoagulation prior to admission. Urology seeing for hematuria ? ?DIC panel showed elevated aPTT at >200 s and elevated INR at 2.2. Stat heparin level ordered and resulted subtherapeutic (0.24) after 4h since rate increase. Discussed with CCM and this is likely due to sepsis. No IV site issues noted by RN, who confirmed that IV site is having blood return. Will continue with confirmatory heparin level 8h from rate increase this AM.  ? ?Pharmacy also asked to dose vancomycin and cefepime. Renal function continues to worsen from baseline. Vancomycin random this AM 18 and therapeutic. Will re-dose vancomycin today. Will re-check vancomycin random in 24-48 hours  post-dose.  ? ?Heparin came back subtherapeutic this PM. Oozing a little per Rn. Checking with MD. Will increase if ok with continuing. We will use a modified goal.  ? ?Addendum ? ?Dr Carlis Abbott said to keep rate the same tonight due to oozing.  ?Goal of Therapy:  ?Heparin level 0.3-0.5 ?Monitor platelets by anticoagulation protocol: Yes ?  ?Plan:  ?Continue heparin 1950 units/hr  ?Daily heparin level, CBC ?Monitor signs and symptoms of bleeding ? ?Onnie Boer, PharmD, BCIDP, AAHIVP, CPP ?Infectious Disease Pharmacist ?02/24/2022 4:53 PM ? ? ? ?

## 2022-02-24 NOTE — Progress Notes (Signed)
ABI's have been completed. ?Preliminary results can be found in CV Proc through chart review.  ? ?02/24/22 10:09 AM ?Jeffrey Ayala RVT   ?

## 2022-02-24 NOTE — Progress Notes (Signed)
Patient ID: Jeffrey Ayala, male   DOB: 01/29/1961, 61 y.o.   MRN: [id number] ? ?  ?Subjective: ?Pt remains intubated/sedated.  On epinephrine/norepinephrine/vasopressin for pressor support with some progress in titrating down support overnight.  ? ?Objective: ?Vital signs in last 24 hours: ?Temp:  [99.3 ?F (37.4 ?C)-101.1 ?F (38.4 ?C)] 99.3 ?F (37.4 ?C) (04/04 5427) ?Pulse Rate:  [110-137] 128 (04/04 0733) ?Resp:  [18-34] 28 (04/04 0733) ?BP: (103-104)/(58-65) 104/58 (04/04 0733) ?SpO2:  [95 %-100 %] 96 % (04/04 0734) ?Arterial Line BP: (103-318)/(50-192) 136/69 (04/04 0600) ?FiO2 (%):  [40 %-70 %] 40 % (04/04 0734) ? ?Intake/Output from previous day: ?04/03 0701 - 04/04 0700 ?In: 5629.2 [I.V.:5223.3; IV Piggyback:405.9] ?Out: 610 [Urine:610] ?Intake/Output this shift: ?No intake/output data recorded. ? ?Physical Exam:  ?GU: Scrotum fairly unchanged since evaluation 12 hours ago.  Still with moderate edema and with ecchymosis over anterior scrotum but still no fluctuance, drainage, induration, erythema, or crepitus.  Still well demarcated area.  Some weeping superficial skin sloughing consistent with edematous change but again not consistent with infection. ? ?Lab Results: ?Recent Labs  ?  02/23/22 ?0623 02/23/22 ?7628 02/24/22 ?3151  ?HGB 12.5* 13.6 11.4*  ?HCT 38.8* 40.0 33.1*  ? ? ?  Latest Ref Rng & Units 02/24/2022  ?  6:05 AM 02/23/2022  ?  4:42 AM 02/23/2022  ?  2:47 AM  ?CBC  ?WBC 4.0 - 10.5 K/uL 19.7    17.6    ?Hemoglobin 13.0 - 17.0 g/dL 11.4   13.6   12.5    ?Hematocrit 39.0 - 52.0 % 33.1   40.0   38.8    ?Platelets 150 - 400 K/uL 82    163    ? ? ? ?BMET ?Recent Labs  ?  02/23/22 ?1846 02/24/22 ?7616  ?NA 138 140  ?K 3.7 4.0  ?CL 104 103  ?CO2 19* 19*  ?GLUCOSE 173* 117*  ?BUN 36* 45*  ?CREATININE 3.43* 4.08*  ?CALCIUM 7.2* 6.7*  ? ? ? ?Studies/Results: ? ? ?Assessment/Plan: ?1) Scrotal edema/ecchymosis:  Exam is not much changed compared to 12 hours ago.  Will discuss with intensive care team.  Still do not  clinically suspect scrotum and source for severe sepsis but may be reasonable to repeat imaging to ensure no objective findings that would add to clinical exam findings in absence of other clear etiology for sepsis.  Culture remain pending. ?2) Hematuria/pyuria: Potentially related to non-obstructing stones but urine culture pending to rule out UTI. ? ? LOS: 2 days  ? ?[name] ?02/24/2022, 7:45 AM ? ?  ?

## 2022-02-24 NOTE — Progress Notes (Signed)
ANTICOAGULATION CONSULT NOTE ? ?Pharmacy Consult for heparin ?Indication: atrial fibrillation ? ?Allergies  ?Allergen Reactions  ? Codeine Hives  ?  Insomnia, jittery  ? Flomax [Tamsulosin Hcl] Nausea Only  ?  Nausea and fatigue  ? ? ?Patient Measurements: ?Height: '5\' 10"'$  (177.8 cm) ?Weight: 104.3 kg (230 lb) ?IBW/kg (Calculated) : 73 ?Heparin Dosing Weight: 95 kg ? ?Vital Signs: ?Temp: 99.7 ?F (37.6 ?C) (04/04 1139) ?Temp Source: Bladder (04/04 6222) ?BP: 111/58 (04/04 1118) ?Pulse Rate: 130 (04/04 1118) ? ?Labs: ?Recent Labs  ?  02/22/22 ?1805 02/22/22 ?1819 02/22/22 ?2007 02/23/22 ?0225 02/23/22 ?9798 02/23/22 ?9211 02/23/22 ?9417 02/23/22 ?1208 02/23/22 ?1846 02/23/22 ?2155 02/24/22 ?4081 02/24/22 ?4481 02/24/22 ?8563 02/24/22 ?1244  ?HGB 13.1   < >  --    < > 12.5*  --  13.6  --   --   --   --  11.4*  --   --   ?HCT 40.6   < >  --    < > 38.8*  --  40.0  --   --   --   --  33.1*  --   --   ?PLT 193  --   --   --  163  --   --   --   --   --   --  82* 77*  --   ?APTT  --   --   --   --   --   --   --   --   --   --   --   --  >200*  --   ?LABPROT 16.0*  --   --   --   --   --   --   --   --   --   --   --  24.3*  --   ?INR 1.3*  --   --   --   --   --   --   --   --   --   --   --  2.2*  --   ?HEPARINUNFRC  --   --   --   --   --    < >  --  0.18*  --  0.23* 0.30  --   --  0.24*  ?CREATININE 1.44*   < >  --   --  2.61*  --   --  3.46* 3.43*  --  4.08*  --   --   --   ?TROPONINIHS 525*  --  1,834*  --  2,807*  --   --  3,550*  --   --   --   --   --   --   ? < > = values in this interval not displayed.  ? ? ? ?Estimated Creatinine Clearance: 23.3 mL/min (A) (by C-G formula based on SCr of 4.08 mg/dL (H)). ? ? ?Medical History: ?Past Medical History:  ?Diagnosis Date  ? Concussion   ? Eosinophilic esophagitis   ? Gastric ulcer   ? History of kidney stones   ? Migraine   ? ? ?Medications:  ?Medications Prior to Admission  ?Medication Sig Dispense Refill Last Dose  ? acetaminophen (TYLENOL) 650 MG CR tablet Take 650  mg by mouth every 8 (eight) hours as needed for pain or fever.   02/22/2022  ? Calcium Carbonate-Vitamin D (OSCAL 500 D-3 PO) Take 1 tablet by mouth daily.   Past Week  ? naproxen sodium (ALEVE) 220 MG tablet Take  220 mg by mouth daily as needed (pain.).   02/22/2022  ? OVER THE COUNTER MEDICATION Take 1 capsule by mouth daily. Prostate vitamin   Past Week  ? allopurinol (ZYLOPRIM) 100 MG tablet Take 1 tablet (100 mg total) by mouth daily. (Patient not taking: Reported on 02/22/2022) 30 tablet 11 Not Taking  ? baclofen (LIORESAL) 10 MG tablet TAKE 1 TABLET BY MOUTH THREE TIMES A DAY AS NEEDED (Patient not taking: Reported on 02/22/2022) 90 tablet 0 Not Taking  ? colchicine-probenecid 0.5-500 MG tablet Take 1 tablet by mouth 2 (two) times daily. (Patient not taking: Reported on 02/22/2022) 30 tablet 1 Not Taking  ? fluticasone (FLOVENT HFA) 220 MCG/ACT inhaler Inhale 2 puffs into the lungs in the morning and at bedtime. Do not eat or drink 30 minutes after inhalation (Patient not taking: Reported on 02/22/2022) 1 Inhaler 12 Not Taking  ? pantoprazole (PROTONIX) 40 MG tablet TAKE 1 TABLET BY MOUTH TWICE A DAY (Patient not taking: Reported on 02/22/2022) 180 tablet 1 Not Taking  ? ? ?Assessment: 61 y.o. male,with new onset atrial fibrillation likely secondary to sepsis. No anticoagulation prior to admission. Urology seeing for hematuria ? ?DIC panel showed elevated aPTT at >200 s and elevated INR at 2.2. Stat heparin level ordered and resulted subtherapeutic (0.24) after 4h since rate increase. Discussed with CCM and this is likely due to sepsis. No IV site issues noted by RN, who confirmed that IV site is having blood return. Will continue with confirmatory heparin level 8h from rate increase this AM.  ? ?Pharmacy also asked to dose vancomycin and cefepime. Renal function continues to worsen from baseline. Vancomycin random this AM 18 and therapeutic. Will re-dose vancomycin today. Will re-check vancomycin random in 24-48 hours  post-dose.  ? ?Goal of Therapy:  ?Heparin level 0.3-0.7 units/ml ?Monitor platelets by anticoagulation protocol: Yes ?  ?Plan:  ?Confirmatory IV heparin infusion to 1950 units/hr ?Confirmatory heparin level in 8h (1600 today) ?Daily heparin level, CBC ?Monitor signs and symptoms of bleeding ? ?Continue cefepime IV 2g q 24h ?Vancomycin IV 1000 mg x 1 ?Vancomycin random level 24-48h ?Re-dose vancomycin per levels ? ?Thank you for involving pharmacy in this patient's care. ? ?Elita Quick, PharmD ?PGY1 Ambulatory Care Pharmacy Resident ?02/24/2022 1:45 PM ? ?**Pharmacist phone directory can be found on [email or web address] listed under [place]** ?

## 2022-02-24 NOTE — Progress Notes (Signed)
ANTICOAGULATION CONSULT NOTE ? ?Pharmacy Consult for heparin ?Indication: atrial fibrillation ? ?Allergies  ?Allergen Reactions  ? Codeine Hives  ?  Insomnia, jittery  ? Flomax [Tamsulosin Hcl] Nausea Only  ?  Nausea and fatigue  ? ? ?Patient Measurements: ?Height: '5\' 10"'$  (177.8 cm) ?Weight: 104.3 kg (230 lb) ?IBW/kg (Calculated) : 73 ?Heparin Dosing Weight: 95 kg ? ?Vital Signs: ?Temp: 99.3 ?F (37.4 ?C) (04/04 0600) ?Temp Source: Bladder (04/04 0400) ?Pulse Rate: 134 (04/04 0600) ? ?Labs: ?Recent Labs  ?  02/22/22 ?1805 02/22/22 ?1819 02/22/22 ?2007 02/23/22 ?0225 02/23/22 ?1914 02/23/22 ?7829 02/23/22 ?1208 02/23/22 ?1846 02/23/22 ?2155 02/24/22 ?5621 02/24/22 ?3086  ?HGB 13.1   < >  --    < > 12.5* 13.6  --   --   --   --  11.4*  ?HCT 40.6   < >  --    < > 38.8* 40.0  --   --   --   --  33.1*  ?PLT 193  --   --   --  163  --   --   --   --   --  82*  ?LABPROT 16.0*  --   --   --   --   --   --   --   --   --   --   ?INR 1.3*  --   --   --   --   --   --   --   --   --   --   ?HEPARINUNFRC  --   --   --   --   --   --  0.18*  --  0.23* 0.30  --   ?CREATININE 1.44*   < >  --   --  2.61*  --  3.46* 3.43*  --  4.08*  --   ?TROPONINIHS 525*  --  1,834*  --  2,807*  --  3,550*  --   --   --   --   ? < > = values in this interval not displayed.  ? ? ? ?Estimated Creatinine Clearance: 23.3 mL/min (A) (by C-G formula based on SCr of 4.08 mg/dL (H)). ? ? ?Medical History: ?Past Medical History:  ?Diagnosis Date  ? Concussion   ? Eosinophilic esophagitis   ? Gastric ulcer   ? History of kidney stones   ? Migraine   ? ? ?Medications:  ?Medications Prior to Admission  ?Medication Sig Dispense Refill Last Dose  ? acetaminophen (TYLENOL) 650 MG CR tablet Take 650 mg by mouth every 8 (eight) hours as needed for pain or fever.   02/22/2022  ? Calcium Carbonate-Vitamin D (OSCAL 500 D-3 PO) Take 1 tablet by mouth daily.   Past Week  ? naproxen sodium (ALEVE) 220 MG tablet Take 220 mg by mouth daily as needed (pain.).   02/22/2022  ?  OVER THE COUNTER MEDICATION Take 1 capsule by mouth daily. Prostate vitamin   Past Week  ? allopurinol (ZYLOPRIM) 100 MG tablet Take 1 tablet (100 mg total) by mouth daily. (Patient not taking: Reported on 02/22/2022) 30 tablet 11 Not Taking  ? baclofen (LIORESAL) 10 MG tablet TAKE 1 TABLET BY MOUTH THREE TIMES A DAY AS NEEDED (Patient not taking: Reported on 02/22/2022) 90 tablet 0 Not Taking  ? colchicine-probenecid 0.5-500 MG tablet Take 1 tablet by mouth 2 (two) times daily. (Patient not taking: Reported on 02/22/2022) 30 tablet 1 Not Taking  ? fluticasone (FLOVENT HFA) 220  MCG/ACT inhaler Inhale 2 puffs into the lungs in the morning and at bedtime. Do not eat or drink 30 minutes after inhalation (Patient not taking: Reported on 02/22/2022) 1 Inhaler 12 Not Taking  ? pantoprazole (PROTONIX) 40 MG tablet TAKE 1 TABLET BY MOUTH TWICE A DAY (Patient not taking: Reported on 02/22/2022) 180 tablet 1 Not Taking  ? ? ?Assessment: 61 y.o. male,with new onset atrial fibrillation likely secondary to sepsis. No anticoagulation prior to admission. Urology seeing for hematuria ? ?Heparin level up to 0.30 and on lower end of therapeutic goal on 1900 units/h. Will proactively increase by 50 units/h to keep in range. Hgb and plateles decreased this AM, but no signs of bleeding noted. No IV site infusion challenges noted per RN.  ? ?Goal of Therapy:  ?Heparin level 0.3-0.7 units/ml ?Monitor platelets by anticoagulation protocol: Yes ?  ?Plan:  ?Increase IV heparin infusion to 1950 units/hr ?Confirmatory heparin level in 8h ?Daily heparin level, CBC ?Monitor signs and symptoms of bleeding ? ?Thank you for involving pharmacy in this patient's care. ? ?Elita Quick, PharmD ?PGY1 Ambulatory Care Pharmacy Resident ?02/24/2022 7:21 AM ? ?**Pharmacist phone directory can be found on [email or web address] listed under [place]** ? ? ? ? ?

## 2022-02-24 NOTE — Progress Notes (Signed)
Patient transported to CT and back.

## 2022-02-24 NOTE — Progress Notes (Signed)
Initial Nutrition Assessment ? ?DOCUMENTATION CODES:  ? ?Not applicable ? ?INTERVENTION:  ? ?Initiate trickle tube feeds via OG tube: ?- Vital 1.5 @ 10 ml/hr (240 ml/day) ? ?Trickle tube feeding regimen provides 360 kcal, 16 grams of protein, and 183 ml of H2O.  ? ? ?RD will monitor for ability to advance tube feeds to goal regimen: ?- Vital 1.5 @ 60 ml/hr (1440 ml/day) ?- ProSource TF 90 ml TID ? ?Recommended tube feeding regimen at goal rate would provide 2400 kcal, 163 grams of protein, and 1100 ml of H2O.  ? ?NUTRITION DIAGNOSIS:  ? ?Inadequate oral intake related to inability to eat as evidenced by NPO status. ? ?GOAL:  ? ?Patient will meet greater than or equal to 90% of their needs ? ?MONITOR:  ? ?Vent status, Labs, Weight trends, TF tolerance, Skin, I & O's ? ?REASON FOR ASSESSMENT:  ? ?Ventilator, Consult ?Enteral/tube feeding initiation and management (trickle tube feeds at 10 ml/hr per MD) ? ?ASSESSMENT:  ? ?61 year old male who presented to the ED on 4/02 with N/V/D. PMH of HTN, PUD, eosinophilic esophagitis, kidney stones. Pt admitted with septic shock, new onset atrial fibrillation, AKI. ? ?04/03 - intubated, brief cardiac arrest ? ?Discussed pt with RN and during ICU rounds. Consult received for initiation of trickle tube feeds at 10 ml/hr. Pt with OG tube tip and side port terminating within stomach per abdominal x-ray on 4/3. RD will leave recommendations for goal tube feeding regimen. ? ?Spoke with pt's son and wife at bedside. They both report pt had a great appetite and excellent PO intake PTA. They report pt with no recent weight changes. ? ?Patient is currently intubated on ventilator support ?MV: 14.5 L/min ?Temp (24hrs), Avg:100.1 ?F (37.8 ?C), Min:99.1 ?F (37.3 ?C), Max:101.1 ?F (38.4 ?C) ?BP (a-line): 122/67 ?MAP (a-line): 82 ? ?Drips: ?Propofol: 18.8 ml/hr (provides 496 kcal daily from lipid) ?Levophed ?Vasopressin ?Epinephrine ?Ketamine ?Heparin ?Sodium bicarb: 100 ml/hr ? ?Medications  reviewed and include: IV solu-cortef, SSI q 4 hours, IV protonix, IV abx ? ?Labs reviewed: BUN 45, creatinine 4.08, ionized calcium 1.10, phosphorus 4.9, elevated LFTs, TG 197, lactic acid 3.6, WBC 19.7, platelets 77 ?CBG's: 106-208 x 24 hours ? ?UOP: 610 ml x 24 hours ?I/O's: +11.6 L since admit ? ?NUTRITION - FOCUSED PHYSICAL EXAM: ? ?Flowsheet Row Most Recent Value  ?Orbital Region No depletion  ?Upper Arm Region No depletion  ?Thoracic and Lumbar Region No depletion  ?Buccal Region Unable to assess  ?Temple Region No depletion  ?Clavicle Bone Region No depletion  ?Clavicle and Acromion Bone Region No depletion  ?Scapular Bone Region No depletion  ?Dorsal Hand No depletion  ?Patellar Region No depletion  ?Anterior Thigh Region No depletion  ?Posterior Calf Region No depletion  ?Edema (RD Assessment) Moderate  [generalized]  ?Hair Reviewed  ?Eyes Unable to assess  ?Mouth Unable to assess  ?Skin Reviewed  ?Nails Reviewed  ? ?  ? ? ?Diet Order:   ?Diet Order   ? ?       ?  Diet NPO time specified  Diet effective now       ?  ? ?  ?  ? ?  ? ? ?EDUCATION NEEDS:  ? ?No education needs have been identified at this time ? ?Skin:  Skin Assessment: ?Skin Integrity Issues: ?Stage II: coccyx ? ?Last BM:  02/24/22 large type 7 ? ?Height:  ? ?Ht Readings from Last 1 Encounters:  ?02/22/22 '5\' 10"'$  (1.778 m)  ? ? ?Weight:  ? ?  Wt Readings from Last 1 Encounters:  ?02/22/22 104.3 kg  ? ? ?Ideal Body Weight:  75.5 kg ? ?BMI:  Body mass index is 33 kg/m?. ? ?Estimated Nutritional Needs:  ? ?Kcal:  2300-2500 ? ?Protein:  150-170 grams ? ?Fluid:  >/= 2.0 L ? ? ? ?Jeffrey Bryant, MS, RD, LDN ?Inpatient Clinical Dietitian ?Please see AMiON for contact information. ? ?

## 2022-02-25 DIAGNOSIS — A419 Sepsis, unspecified organism: Secondary | ICD-10-CM | POA: Diagnosis not present

## 2022-02-25 DIAGNOSIS — R6521 Severe sepsis with septic shock: Secondary | ICD-10-CM | POA: Diagnosis not present

## 2022-02-25 DIAGNOSIS — J9601 Acute respiratory failure with hypoxia: Secondary | ICD-10-CM | POA: Diagnosis not present

## 2022-02-25 DIAGNOSIS — Z9911 Dependence on respirator [ventilator] status: Secondary | ICD-10-CM | POA: Diagnosis not present

## 2022-02-25 LAB — GLUCOSE, CAPILLARY
Glucose-Capillary: 147 mg/dL — ABNORMAL HIGH (ref 70–99)
Glucose-Capillary: 152 mg/dL — ABNORMAL HIGH (ref 70–99)
Glucose-Capillary: 154 mg/dL — ABNORMAL HIGH (ref 70–99)
Glucose-Capillary: 157 mg/dL — ABNORMAL HIGH (ref 70–99)
Glucose-Capillary: 188 mg/dL — ABNORMAL HIGH (ref 70–99)
Glucose-Capillary: 198 mg/dL — ABNORMAL HIGH (ref 70–99)

## 2022-02-25 LAB — CBC
HCT: 30.6 % — ABNORMAL LOW (ref 39.0–52.0)
Hemoglobin: 10 g/dL — ABNORMAL LOW (ref 13.0–17.0)
MCH: 29.9 pg (ref 26.0–34.0)
MCHC: 32.7 g/dL (ref 30.0–36.0)
MCV: 91.6 fL (ref 80.0–100.0)
Platelets: 63 10*3/uL — ABNORMAL LOW (ref 150–400)
RBC: 3.34 MIL/uL — ABNORMAL LOW (ref 4.22–5.81)
RDW: 17.2 % — ABNORMAL HIGH (ref 11.5–15.5)
WBC: 21.9 10*3/uL — ABNORMAL HIGH (ref 4.0–10.5)
nRBC: 0 % (ref 0.0–0.2)

## 2022-02-25 LAB — BASIC METABOLIC PANEL
Anion gap: 17 — ABNORMAL HIGH (ref 5–15)
BUN: 79 mg/dL — ABNORMAL HIGH (ref 6–20)
CO2: 21 mmol/L — ABNORMAL LOW (ref 22–32)
Calcium: 6.2 mg/dL — CL (ref 8.9–10.3)
Chloride: 99 mmol/L (ref 98–111)
Creatinine, Ser: 5.67 mg/dL — ABNORMAL HIGH (ref 0.61–1.24)
GFR, Estimated: 11 mL/min — ABNORMAL LOW (ref >60)
Glucose, Bld: 168 mg/dL — ABNORMAL HIGH (ref 70–99)
Potassium: 3.8 mmol/L (ref 3.5–5.1)
Sodium: 137 mmol/L (ref 135–145)

## 2022-02-25 LAB — HEPATIC FUNCTION PANEL
ALT: 54 U/L — ABNORMAL HIGH (ref 0–44)
AST: 87 U/L — ABNORMAL HIGH (ref 15–41)
Albumin: 2.3 g/dL — ABNORMAL LOW (ref 3.5–5.0)
Alkaline Phosphatase: 96 U/L (ref 38–126)
Bilirubin, Direct: 7.8 mg/dL — ABNORMAL HIGH (ref 0.0–0.2)
Indirect Bilirubin: 4 mg/dL — ABNORMAL HIGH (ref 0.3–0.9)
Total Bilirubin: 11.8 mg/dL — ABNORMAL HIGH (ref 0.3–1.2)
Total Protein: 4.6 g/dL — ABNORMAL LOW (ref 6.5–8.1)

## 2022-02-25 LAB — CK: Total CK: 978 U/L — ABNORMAL HIGH (ref 49–397)

## 2022-02-25 LAB — VANCOMYCIN, RANDOM: Vancomycin Rm: 15

## 2022-02-25 LAB — HEPARIN LEVEL (UNFRACTIONATED)
Heparin Unfractionated: 0.11 IU/mL — ABNORMAL LOW (ref 0.30–0.70)
Heparin Unfractionated: 0.19 IU/mL — ABNORMAL LOW (ref 0.30–0.70)

## 2022-02-25 LAB — MAGNESIUM: Magnesium: 2 mg/dL (ref 1.7–2.4)

## 2022-02-25 LAB — PHOSPHORUS: Phosphorus: 4.8 mg/dL — ABNORMAL HIGH (ref 2.5–4.6)

## 2022-02-25 MED ORDER — LINEZOLID 600 MG/300ML IV SOLN
600.0000 mg | Freq: Two times a day (BID) | INTRAVENOUS | Status: DC
  Administered 2022-02-25 – 2022-02-26 (×3): 600 mg via INTRAVENOUS
  Filled 2022-02-25 (×4): qty 300

## 2022-02-25 MED ORDER — METHYLENE BLUE 0.5 % INJ SOLN
200.0000 mg | Freq: Once | Status: AC
  Administered 2022-02-25: 200 mg via INTRAVENOUS
  Filled 2022-02-25: qty 40

## 2022-02-25 MED ORDER — CALCIUM GLUCONATE-NACL 1-0.675 GM/50ML-% IV SOLN
1.0000 g | Freq: Once | INTRAVENOUS | Status: AC
  Administered 2022-02-25: 1000 mg via INTRAVENOUS
  Filled 2022-02-25: qty 50

## 2022-02-25 MED ORDER — METHYLENE BLUE 0.5 % INJ SOLN
200.0000 mg | Freq: Four times a day (QID) | Status: AC
  Administered 2022-02-25 – 2022-02-26 (×2): 200 mg via INTRAVENOUS
  Filled 2022-02-25 (×2): qty 40

## 2022-02-25 MED ORDER — ACETAMINOPHEN 325 MG PO TABS
650.0000 mg | ORAL_TABLET | Freq: Four times a day (QID) | ORAL | Status: DC | PRN
  Administered 2022-02-25 – 2022-03-02 (×2): 650 mg
  Filled 2022-02-25 (×2): qty 2

## 2022-02-25 MED ORDER — FUROSEMIDE 10 MG/ML IJ SOLN
100.0000 mg | Freq: Two times a day (BID) | INTRAVENOUS | Status: DC
  Administered 2022-02-25 – 2022-02-26 (×3): 100 mg via INTRAVENOUS
  Filled 2022-02-25 (×4): qty 10

## 2022-02-25 MED ORDER — PIPERACILLIN-TAZOBACTAM IN DEX 2-0.25 GM/50ML IV SOLN
2.2500 g | Freq: Three times a day (TID) | INTRAVENOUS | Status: DC
  Administered 2022-02-25 – 2022-02-26 (×4): 2.25 g via INTRAVENOUS
  Filled 2022-02-25 (×5): qty 50

## 2022-02-25 NOTE — Progress Notes (Signed)
Brief Nutrition Note ? ?Discussed pt with RN and during ICU rounds. Pt with Vital 1.5 tube feeds infusing via OG tube at 10 ml/hr (RD recommended goal rate is 60 ml/hr). Discussed pt with CCM MD. Per CCM MD, due to ongoing pressor requirements, continue tube feeds at 10 ml/hr only and do not advance at this time. RD will continue re-evaluate for ability to advance tube feeds to goal. ? ? ?Jeffrey Bryant, MS, RD, LDN ?Inpatient Clinical Dietitian ?Please see AMiON for contact information. ? ?

## 2022-02-25 NOTE — Progress Notes (Signed)
?   02/25/22 1930  ?Vent Select  ?Invasive or Noninvasive Invasive  ?Adult Vent Y  ?Airway 7.5 mm  ?Placement Date/Time: 02/23/22 0235   Inserted prior to hospital arrival?: (c)   Grade View: Grade 1  Placed By: ICU physician  Airway Device: Endotracheal Tube  Laryngoscope Blade: 4  ETT Types: Subglottic;Oral  Size (mm): 7.5 mm  Cuffed: Cuffed  Inse...  ?Secured at (cm) 26 cm  ?Measured From Lips  ?Secured Location (S)  Right ?(middle lower lip small cut)  ?Secured By [employer]  ?Tube Holder Repositioned Yes  ?Prone position No  ?Cuff Pressure (cm H2O) Green OR 18-26 CmH2O  ?Site Condition Dry  ?Adult Ventilator Settings  ?Vent Type Servo i  ?Humidity HME  ?Vent Mode PRVC  ?Vt Set 600 mL  ?Set Rate 22 bmp  ?FiO2 (%) 40 %  ?I Time 0.7 Sec(s)  ?PEEP 5 cmH20  ?Adult Ventilator Measurements  ?Peak Airway Pressure 25 L/min  ?Mean Airway Pressure 11 cmH20  ?Plateau Pressure 14 cmH20  ?Resp Rate Spontaneous 5 br/min  ?Resp Rate Total 27 br/min  ?Exhaled Vt 787 mL  ?Measured Ve 13.6 mL  ?I:E Ratio Measured 1:2.0  ?Auto PEEP 0 cmH20  ?Total PEEP 5 cmH20  ?SpO2 97 %  ?Adult Ventilator Alarms  ?Alarms On Y  ?Ve High Alarm 24 L/min  ?Ve Low Alarm 4 L/min  ?Resp Rate High Alarm 40 br/min  ?Resp Rate Low Alarm 10  ?PEEP Low Alarm 3 cmH2O  ?Press High Alarm 50 cmH2O  ?Press Low Alarm 5 cmH2O  ?T Apnea 20 sec(s)  ?VAP Prevention  ?HOB> 30 Degrees Y  ?Breath Sounds  ?Bilateral Breath Sounds Rhonchi  ?R Upper  Breath Sounds Rhonchi  ?L Upper Breath Sounds Rhonchi  ?R Lower Breath Sounds Rhonchi  ?L Lower Breath Sounds Rhonchi  ?Airway Suctioning/Secretions  ?Suction Type ETT  ?Suction Device  Catheter  ?Secretion Amount Moderate  ?Secretion Color White;Yellow;Tan  ?Secretion Consistency Thick  ?Suction Tolerance Tolerated well  ?Suctioning Adverse Effects None  ? ?Placed pt. Back on PRVC mode per md order with above settings. ?

## 2022-02-25 NOTE — Progress Notes (Signed)
ANTICOAGULATION CONSULT NOTE ? ?Pharmacy Consult for heparin ?Indication: atrial fibrillation ? ?Allergies  ?Allergen Reactions  ? Codeine Hives  ?  Insomnia, jittery  ? Flomax [Tamsulosin Hcl] Nausea Only  ?  Nausea and fatigue  ? ? ?Patient Measurements: ?Height: '5\' 10"'$  (177.8 cm) ?Weight: 126.4 kg (278 lb 10.6 oz) ?IBW/kg (Calculated) : 73 ?Heparin Dosing Weight: 95 kg ? ?Vital Signs: ?Temp: 100.8 ?F (38.2 ?C) (04/05 1519) ?Temp Source: Bladder (04/05 1519) ?Pulse Rate: 117 (04/05 1529) ? ?Labs: ?Recent Labs  ?  02/22/22 ?2007 02/23/22 ?0225 02/23/22 ?7672 02/23/22 ?0947 02/23/22 ?1208 02/23/22 ?1846 02/24/22 ?0962 02/24/22 ?8366 02/24/22 ?2947 02/24/22 ?1244 02/24/22 ?1600 02/24/22 ?1601 02/25/22 ?0434 02/25/22 ?0645 02/25/22 ?1659 02/25/22 ?6546  ?HGB  --    < > 12.5* 13.6  --   --   --  11.4*  --   --   --   --  10.0*  --   --   --   ?HCT  --    < > 38.8* 40.0  --   --   --  33.1*  --   --   --   --  30.6*  --   --   --   ?PLT  --    < > 163  --   --   --   --  82* 77*  --   --   --  63*  --   --   --   ?APTT  --   --   --   --   --   --   --   --  >200*  --   --   --   --   --   --   --   ?LABPROT  --   --   --   --   --   --   --   --  24.3*  --   --   --   --   --   --   --   ?INR  --   --   --   --   --   --   --   --  2.2*  --   --   --   --   --   --   --   ?HEPARINUNFRC  --   --   --   --  0.18*   < > 0.30  --   --    < > 0.24*  --  0.11*  --   --  0.19*  ?CREATININE  --    < > 2.61*  --  3.46*   < > 4.08*  --   --   --   --  4.84*  --  5.67*  --   --   ?CKTOTAL  --   --   --   --   --   --   --   --   --   --   --   --   --   --  978*  --   ?TROPONINIHS 1,834*  --  2,807*  --  3,550*  --   --   --   --   --   --   --   --   --   --   --   ? < > = values in this interval not displayed.  ? ? ? ?Estimated Creatinine Clearance: 18.5 mL/min (A) (by C-G formula based on SCr of  5.67 mg/dL (H)). ? ? ?Assessment:  ?61 y.o. male with new onset Afib RVR, likely secondary to sepsis.  Urology seeing for hematuria.   DIC panel showed elevated aPTT at >200 s and elevated INR at 2.2. Stat heparin level ordered and resulted subtherapeutic (0.24) after 4h since rate increase. Discussed with CCM and this is likely due to sepsis. ? ?Heparin level remains sub-therapeutic at 0.19 units/mL on 2150 units/hr.  RN reports continued oozing from scrotum, but stable.  No issue with heparin infusion. ? ?Goal of Therapy:  ?Heparin level 0.3-0.7 units/ml ?Monitor platelets by anticoagulation protocol: Yes ?  ?Plan:  ?Increase IV heparin infusion to 2450 units/hr ?F/U AM labs ?Monitor signs and symptoms of bleeding ? ?Lainie Daubert D. Mina Marble, PharmD, BCPS, BCCCP ?02/25/2022, 7:05 PM ? ?

## 2022-02-25 NOTE — Progress Notes (Signed)
eLink Physician-Brief Progress Note ?Patient Name: Jeffrey Ayala ?DOB: 1961/07/19 ?MRN: [id number] ? ? ?Date of Service ? 02/25/2022  ?HPI/Events of Note ? Fever to 101.1 F - Nursing request for Tylenol. Unfortunately, AST and ALT are both elevated and Creatinine = 4.84. Therefore, will avoid Tylenol or Motrin.   ?eICU Interventions ? Continue with ice packs PRN.  ? ? ? ?Intervention Category ?Major Interventions: Other: ? ?Jeffrey Ayala ?02/25/2022, 2:11 AM ?

## 2022-02-25 NOTE — Progress Notes (Signed)
Pharmacy Antibiotic Note ? ?Jeffrey Ayala is a 61 y.o. male admitted on 02/22/2022 with  N/V/D, generalized weakness, groin rash/pain and now in septic shock .  Patient was started on vancomycin and cefepime. Now to transition to linezolid and piperacillin/tazobactam.  ? ?Renal function continues to worsen with Scr now up to 5.67 (baseline appears to be ~1.1). Vancomycin random this AM 15 after 2g loading dose given on 4/2. Patient still fevering, with Tmax 101.76F. WBC continues to worsen, now at 21 from 19. Platelets continue to decrease and now at 63--will continue to closely monitor given linezolid new start.  ? ?Pharmacy also asked to dose heparin. Heparin level this AM 0.11 and subtherapeutic on 1950 units/h. Reported some oozing yesterday that has now improved. Hgb continues to remain stable, platelets decreasing and today are at 63. No signs of other overt bleeding noted by RN, no IV site issues.  ? ?Plan: ?Stop Vancomycin/Cefepime ?Start linezolid IV 600 mg BID ?Start piperacillin-tazobactam 2.25 g IV q 8h ?Monitor platelets closely ?Continue to monitor renal function, blood cultures, and signs of clinical improvement ? ?Increase IV heparin gtt to 2150 units/h ?8h heparin level ?Daily heparin level, CBC ?Monitor for signs and symptoms of bleeding ? ?Height: '5\' 10"'$  (177.8 cm) ?Weight: 126.4 kg (278 lb 10.6 oz) ?IBW/kg (Calculated) : 73 ? ?Temp (24hrs), Avg:100.8 ?F (38.2 ?C), Min:99.1 ?F (37.3 ?C), Max:101.5 ?F (38.6 ?C) ? ?Recent Labs  ?Lab 02/22/22 ?1805 02/22/22 ?1819 02/23/22 ?0932 02/23/22 ?1200 02/23/22 ?1208 02/23/22 ?1846 02/24/22 ?6712 02/24/22 ?4580 02/24/22 ?9983 02/24/22 ?1601 02/25/22 ?3825 02/25/22 ?0645  ?WBC 2.3*  --  17.6*  --   --   --   --   --  19.7*  --  21.9*  --   ?CREATININE 1.44*   < > 2.61*  --  3.46* 3.43*  --  4.08*  --  4.84*  --  5.67*  ?LATICACIDVEN 4.3*   < > >9.0* 7.7*  --  3.9* 3.6*  --   --  3.9*  --   --   ?VANCORANDOM  --   --   --   --   --   --  18  --   --   --  15  --    ? < > = values in this interval not displayed.  ?  ?Estimated Creatinine Clearance: 18.5 mL/min (A) (by C-G formula based on SCr of 5.67 mg/dL (H)).   ? ?Allergies  ?Allergen Reactions  ? Codeine Hives  ?  Insomnia, jittery  ? Flomax [Tamsulosin Hcl] Nausea Only  ?  Nausea and fatigue  ? ? ?Antimicrobials this admission: ?4/2 flagyl x 1 ?4/2 vancomycin >> 4/5 ?4/2 cefepime >> 4/5 ?4/5 piperacillin-tazobactam >>  ?4/5 linezolid >> ? ?Dose adjustments this admission: ?Vanc 750 mg q 24h > vanc per random ?Cefepime q8h > cefepime q 24h ? ?Microbiology results: ?4/1 BCx: ngtd ?4/2 UCx: multiple species, needs recollection  ?4/3 MRSA PCR: negative ? ?Thank you for involving pharmacy in this patient's care. ? ?Elita Quick, PharmD ?PGY1 Ambulatory Care Pharmacy Resident ?02/25/2022 8:56 AM ? ?**Pharmacist phone directory can be found on [email or web address] listed under [place]** ?

## 2022-02-25 NOTE — Progress Notes (Signed)
? ?NAME:  Jeffrey Ayala, MRN:  [id number], DOB:  [birth year], LOS: 3 ?ADMISSION DATE:  02/22/2022, CONSULTATION DATE:  4/2 ?REFERRING MD:  Doren Custard- ED CHIEF COMPLAINT: Septic shock ? ?History of Present Illness:  ?Jeffrey Ayala, is a 61 y.o. male, who presented to the [facility] ED with a chief complaint of nausea, vomiting, diarrhea. ? ?They have a pertinent past medical history of gastric ulcer, kidney stones, eosinophilic esophagitis, migraines  ? ?History obtained from Halford Goetzke (spouse), on 4/1 patient began complaining of feeling unwell. Wife denies sick contacts, questionable food.  Wife denies taking any meds currently.  Around 2 PM on 4/2 the patient developed nausea, vomiting, and diarrhea while at a birthday party and soiled himself. EMS was called. When EMS arrived he was found to be in A-fib with a rate of 120-160. ? ?At the ED he was found to be febrile with a Tmax of 102.9.  A code sepsis was called in the emergency department, blood cultures were drawn, 2 L of IV fluid were given, vanc and cefepime were ordered.  CT chest negative for acute process.  CT abdomen pelvis with and without contrast notable for swollen scrotum concerning for infection, colon filled with diarrhea.  Urology and cardiology were consulted by the ED.  Despite fluid resuscitation the patient developed hypotension requiring vasopressors. ? ?PCCM was consulted for admission. ? ?Pertinent  Medical History  ?gastric ulcer, kidney stones, eosinophilic esophagitis, migraines  ? ?Significant Hospital Events: ?Including procedures, antibiotic start and stop dates in addition to other pertinent events   ?4/2 Presented to the ED. CT chest negative for acute process.  CT abdomen pelvis with and without contrast notable for swollen scrotum concerning for infection, colon filled with diarrhea.  Urology and cardiology were consulted by the ED. Vanc, cefepime started.  ?4/3 Intubated overnight, brief cardiac arrest. Escalating pressors. ? ?Interim  History / Subjective:  ?Febrile overnight. Having some bloody drainage around scrotum still.  ? ?Objective   ?Blood pressure 126/68, pulse (!) 139, temperature (!) 101.3 ?F (38.5 ?C), resp. rate (!) 25, height '5\' 10"'$  (1.778 m), weight 126.4 kg, SpO2 99 %.  ?   ?Vent Mode: PRVC ?FiO2 (%):  [40 %-60 %] 50 % ?Set Rate:  [22 bmp] 22 bmp ?Vt Set:  [600 mL] 600 mL ?PEEP:  [5 cmH20-8 cmH20] 5 cmH20 ?Plateau Pressure:  [16 cmH20-21 cmH20] 16 cmH20  ? ?Intake/Output Summary (Last 24 hours) at 02/25/2022 0810 ?Last data filed at 02/25/2022 0600 ?Gross per 24 hour  ?Intake 5257.36 ml  ?Output 625 ml  ?Net 4632.36 ml  ? ? ?Filed Weights  ? 02/22/22 1820 02/25/22 0432  ?Weight: 104.3 kg 126.4 kg  ? ? ?Examination: ?General: critically ill appearing man lying in bed in NAD ?HEENT: /AT, eyes anicteric ?Neuro: RASS -5, +cough reflex ?CV: S1-S2, tachycardic, irregular rhythm ?PULM: Synchronous with the vent, CTA B, minimal tan secretions from endotracheal ?GI: Soft, nontender, hypoactive bowel sounds ?Extremities: Peripheral edema, ongoing significant mottling in his cold  feet.  Both hands very warm. ?GU: Significant scrotal edema, minimal bloody drainage from few skin tears.  Well demarcated dark purple discoloration of most of the scrotum, sparing the sides. ?Skin: No diffuse rashes, scrotal and foot abnormalities as above ? ?Blood culture> NGTD ?Urine culture> multiple species ? ?Potassium 3.8 ?Bicarb 21 ?BUN 79 ?Creatinine 5.67 ?Calcium 6.2 ?Phos 4.8 ?AST 87 ?ALT 54 ?Bilirubin 11.8 ?WBC 21.9  ?H/H 10/0.6 ?Platelets 63 ?LA 3.9 ? ?LE arterial US > bilateral significant  arterial disease.  Unable to obtain TBI in the left leg with noncompressible left lower extremity vessels.  ABI on the right consistent with severe arterial disease currently. ? ?Resolved Hospital Problem list   ? ? ?Assessment & Plan:  ?Septic shock, no clear etiology at this time--- the most significant source still appears to be his scrotum. UA with high WBC and  infected appearing scrotum raise concern for urinary source, but urine cultures were unhelpful.  Has BLE TKA, but no recent pain or issues with it.  No cholecystitis. ?Lactic acidosis due to sepsis ?Abdominal pain, n/v/d likely related to sepsis  ?-Continue broad antibiotics-switching to Zosyn and linezolid ?- Continue vasopressors as required to maintain MAP greater than 65.  Still slowly weaning down. ?-Continue stress dose steroids ?- Continue follow cultures until finalized. ?- Appreciate urology's input ?-Any methylene blue.  Not sure how long we can use this as a continuous infusion.  PharmD to look into this today. ? ?New onset A. Fib, with RVR, suspect secondary to sepsis, TSH 0.68, no history of A-fib ?Troponin & BNP elevation> suspect related to sepsis ?-Continue heparin, dosing per pharmacy ?-Lenient rate control given sepsis and fevers ? ?Acute hypoxic respiratory failure requiring mechanical ventilation ?- Low tidal volume ventilation, 4 to 8 cc/kg ideal body with goal plateaus and 30 driving pressure less than 15 ?- VAP prevention protocol ?- Pad protocol for sedation-propofol, ketamine, + PRN fentanyl ? ?AKI, suspect secondary to sepsis. No hydronephrosis on imaging. ?hyperphosphatemia ?-Renally dose meds, avoid nephrotoxic meds ?- Continue bicarb infusion ?-Strict I's/O ?- High-dose Lasix today given that he is 15 L positive. ?- Nephrology consultation ?- Continue to monitor ?- Continue Foley catheter ? ?Elevated transaminases and hyperbilirubinemia; likely due to sepsis-continuing to worsen.  No cholecystitis on imaging. ?-monitor ? ?Hypocalcemia ?-monitor ? ?Acute metabolic encephalopathy due to sepsis ?-PAD protocol ? ?Hyperglycemia, controlled ?-SSI PRN ?-Goal blood glucose 140-180 ? ?Leukopenia, secondary to sepsis> now leukocytosis ?thrombocytopenia, likely due to sepsis versus antibiotics ?-Continue to monitor ? ?Acute electrolyte abnormality- hypokalemia, hypomagnesia, hypocalcemia ?-Continue  to monitor electrolytes ?- Lasix today ? ?History of gastric ulcer ?-Continue PPI ? ?Splenomegaly seen on CT, chronic since 2015 ?-OP follow up ? ?At risk for malnutrition ?-Continue trickle TF, but monitor closely for intolerance ? ?Wife updated at bedside today. ? ?Best Practice (right click and "Reselect all SmartList Selections" daily)  ? ?Diet/type: tubefeeds ?DVT prophylaxis: systemic heparin ?GI prophylaxis: PPI ?Lines: Central line, Arterial Line, and yes and it is still needed ?Foley:  Yes, and it is still needed ?Code Status:  full code ?Last date of multidisciplinary goals of care discussion [4/2 Discussed with wife Bhavin Monjaraz at bedside.  Full scope of care.  Once CPR and intubation if needed] ? ?Labs   ?CBC: ?Recent Labs  ?Lab 02/22/22 ?1805 02/22/22 ?1819 02/23/22 ?0225 02/23/22 ?6295 02/23/22 ?2841 02/24/22 ?3244 02/24/22 ?0102 02/25/22 ?0434  ?WBC 2.3*  --   --  17.6*  --  19.7*  --  21.9*  ?NEUTROABS 2.2  --   --   --   --   --   --   --   ?HGB 13.1   < > 12.6* 12.5* 13.6 11.4*  --  10.0*  ?HCT 40.6   < > 37.0* 38.8* 40.0 33.1*  --  30.6*  ?MCV 92.5  --   --  93.9  --  88.7  --  91.6  ?PLT 193  --   --  163  --  82* 77* 63*  ? < > = values in this interval not displayed.  ? ? ? ?Basic Metabolic Panel: ?Recent Labs  ?Lab 02/22/22 ?2007 02/23/22 ?0225 02/23/22 ?8546 02/23/22 ?2703 02/23/22 ?1208 02/23/22 ?1846 02/24/22 ?5009 02/24/22 ?1601 02/25/22 ?0434 02/25/22 ?0645  ?NA  --    < > 137   < > 138 138 140 140  --  137  ?K  --    < > 2.9*   < > 3.2* 3.7 4.0 4.2  --  3.8  ?CL  --   --  104  --  104 104 103 101  --  99  ?CO2  --   --  15*  --  15* 19* 19* 19*  --  21*  ?GLUCOSE  --   --  191*  --  245* 173* 117* 109*  --  168*  ?BUN  --   --  21*  --  29* 36* 45* 60*  --  79*  ?CREATININE  --   --  2.61*  --  3.46* 3.43* 4.08* 4.84*  --  5.67*  ?CALCIUM  --   --  7.2*  --  7.4* 7.2* 6.7* 6.6*  --  6.2*  ?MG 1.1*  --  1.7  --  2.1  --  1.8  --  2.0  --   ?PHOS  --   --  7.0*  --   --   --  4.9*  --   4.8*  --   ? < > = values in this interval not displayed.  ? ? ?GFR: ?Estimated Creatinine Clearance: 18.5 mL/min (A) (by C-G formula based on SCr of 5.67 mg/dL (H)). ?Recent Labs  ?Lab 02/22/22 ?3818

## 2022-02-25 NOTE — Consult Note (Signed)
[facility]  ?HISTORY AND PHYSICAL ? ?Jeffrey Ayala is an 60 y.o. male.   ? ?Chief Complaint: n/v/d ? ?HPI: Pt is a 60M with a PMH sig for h/o gastric ulcer, nephrolithiasis, eosinophilic esophagitis, and migraines who is now seen in consultation at the request of Dr. Clark for eval and recs re: AKI.   ? ?Pt presented to ED 02/22/22 for feeling unwell, n/v/d.  Found to have fever, swollen scrotum, Afib.  Got CT abd/ pelvis with contrast and CT angio- negative.  Was given broad-spectrum antibiotics, urology consulted, no surgical intervention recommended at present.   ? ?Pt required high dose levophed at 80, epi, and vaso.  [name] started (and now finished) in effort to correct vasoplegia.  Levophed now at 53.  He is on bicarb gtt, stress dose steroids, linezolid and Zosyn, and has received a Lasix challenge.  Cr is now 5.7 up from a baseline of 1.2-1.4.   ? ?In this setting we are asked to see.   ? ?Has made about 500 mL of urine daily but has trailed off in the past couple of days. ? ?PMH: ?Past Medical History:  ?Diagnosis Date  ? Concussion   ? Eosinophilic esophagitis   ? Gastric ulcer   ? History of kidney stones   ? Migraine   ? ?PSH: ?Past Surgical History:  ?Procedure Laterality Date  ? CYSTOSCOPY    ? DENTAL SURGERY    ? FINGER SURGERY    ? HERNIA REPAIR    ? at age 4  ? KNEE ARTHROSCOPY Left   ? LITHOTRIPSY    ? NEPHROLITHOTOMY    ? TONSILLECTOMY    ? TOTAL KNEE ARTHROPLASTY Bilateral 11/04/2018  ? Procedure: BILATERAL TOTAL KNEE ARTHROPLASTY;  Surgeon: Duda, Marcus V, MD;  Location: [facility] OR;  Service: Orthopedics;  Laterality: Bilateral;  spinal/epidural per anesthesiologist  ? ? ?Past Medical History:  ?Diagnosis Date  ? Concussion   ? Eosinophilic esophagitis   ? Gastric ulcer   ? History of kidney stones   ? Migraine   ? ? ?Medications: ? Scheduled: ? chlorhexidine gluconate (MEDLINE KIT)  15 mL Mouth Rinse BID  ? Chlorhexidine Gluconate Cloth  6 each Topical Q0600  ?  fludrocortisone  0.1 mg Per Tube Daily  ? hydrocortisone sod succinate (SOLU-CORTEF) inj  100 mg Intravenous Q12H  ? insulin aspart  0-15 Units Subcutaneous Q4H  ? mouth rinse  15 mL Mouth Rinse 10 times per day  ? pantoprazole (PROTONIX) IV  40 mg Intravenous QHS  ? sodium chloride flush  10-40 mL Intracatheter Q12H  ? ? ?Medications Prior to Admission  ?Medication Sig Dispense Refill  ? acetaminophen (TYLENOL) 650 MG CR tablet Take 650 mg by mouth every 8 (eight) hours as needed for pain or fever.    ? Calcium Carbonate-Vitamin D (OSCAL 500 D-3 PO) Take 1 tablet by mouth daily.    ? naproxen sodium (ALEVE) 220 MG tablet Take 220 mg by mouth daily as needed (pain.).    ? OVER THE COUNTER MEDICATION Take 1 capsule by mouth daily. Prostate vitamin    ? allopurinol (ZYLOPRIM) 100 MG tablet Take 1 tablet (100 mg total) by mouth daily. (Patient not taking: Reported on 02/22/2022) 30 tablet 11  ? baclofen (LIORESAL) 10 MG tablet TAKE 1 TABLET BY MOUTH THREE TIMES A DAY AS NEEDED (Patient not taking: Reported on 02/22/2022) 90 tablet 0  ? colchicine-probenecid 0.5-500 MG tablet Take 1 tablet by mouth 2 (two) times   daily. (Patient not taking: Reported on 02/22/2022) 30 tablet 1  ? fluticasone (FLOVENT HFA) 220 MCG/ACT inhaler Inhale 2 puffs into the lungs in the morning and at bedtime. Do not eat or drink 30 minutes after inhalation (Patient not taking: Reported on 02/22/2022) 1 Inhaler 12  ? pantoprazole (PROTONIX) 40 MG tablet TAKE 1 TABLET BY MOUTH TWICE A DAY (Patient not taking: Reported on 02/22/2022) 180 tablet 1  ? ? ?ALLERGIES: ?  ?Allergies  ?Allergen Reactions  ? Codeine Hives  ?  Insomnia, jittery  ? Flomax [Tamsulosin Hcl] Nausea Only  ?  Nausea and fatigue  ? ? ?FAM HX: ?Family History  ?Problem Relation Age of Onset  ? COPD Mother   ? Sarcoidosis Brother   ? Cancer Brother   ? Stomach cancer Neg Hx   ? Pancreatic cancer Neg Hx   ? Esophageal cancer Neg Hx   ? Colon cancer Neg Hx   ? Rectal cancer Neg Hx   ? ? ?Social  History:  ? reports that he has never smoked. He has never used smokeless tobacco. He reports current alcohol use. He reports that he does not use drugs. ? ?ROS: ?ROS: unobtainable- intubated and sedated ? ?Blood pressure 126/68, pulse (!) 140, temperature (!) 101.1 ?F (38.4 ?C), resp. rate (!) 21, height 5' 10" (1.778 m), weight 126.4 kg, SpO2 98 %. ?PHYSICAL EXAM: ?Physical Exam ?GEN: ill-appearing ?HEENT eyes closed ?PULM coarse bilaterally, intubated ?CV tachycardic ?ABD distended, still soft, hypoactive bowel sounds ?GU: swollen scrotum with deeply violaceous discoloration with some weeping ?EXT 2 + anasarca, mottled and cool bilateral soles of feet and toes ?NEURO intubated and sedated ? ?Results for orders placed or performed during the hospital encounter of 02/22/22 (from the past 48 hour(s))  ?Glucose, capillary     Status: Abnormal  ? Collection Time: 02/23/22  3:32 PM  ?Result Value Ref Range  ? Glucose-Capillary 208 (H) 70 - 99 mg/dL  ?  Comment: Glucose reference range applies only to samples taken after fasting for at least 8 hours.  ?Lactic acid, plasma     Status: Abnormal  ? Collection Time: 02/23/22  6:46 PM  ?Result Value Ref Range  ? Lactic Acid, Venous 3.9 (HH) 0.5 - 1.9 mmol/L  ?  Comment: CRITICAL VALUE NOTED.  VALUE IS CONSISTENT WITH PREVIOUSLY REPORTED AND CALLED VALUE. ?Performed at [facility], [place] [address] [place], [place] [address] ?  ?Basic metabolic panel     Status: Abnormal  ? Collection Time: 02/23/22  6:46 PM  ?Result Value Ref Range  ? Sodium 138 135 - 145 mmol/L  ? Potassium 3.7 3.5 - 5.1 mmol/L  ? Chloride 104 98 - 111 mmol/L  ? CO2 19 (L) 22 - 32 mmol/L  ? Glucose, Bld 173 (H) 70 - 99 mg/dL  ?  Comment: Glucose reference range applies only to samples taken after fasting for at least 8 hours.  ? BUN 36 (H) 6 - 20 mg/dL  ? Creatinine, Ser 3.43 (H) 0.61 - 1.24 mg/dL  ? Calcium 7.2 (L) 8.9 - 10.3 mg/dL  ? GFR, Estimated 20 (L) >60 mL/min  ?  Comment: (NOTE) ?Calculated  using the CKD-EPI Creatinine Equation (2021) ?  ? Anion gap 15 5 - 15  ?  Comment: Performed at [facility], [place] [address] [place], [place] [address]  ?Glucose, capillary     Status: Abnormal  ? Collection Time: 02/23/22  8:48 PM  ?Result Value Ref Range  ?  Glucose-Capillary 151 (H) 70 - 99 mg/dL  ?  Comment: Glucose reference range applies only to samples taken after fasting for at least 8 hours.  ?Heparin level (unfractionated)     Status: Abnormal  ? Collection Time: 02/23/22  9:55 PM  ?Result Value Ref Range  ? Heparin Unfractionated 0.23 (L) 0.30 - 0.70 IU/mL  ?  Comment: (NOTE) ?The clinical reportable range upper limit is being lowered to >1.10 ?to align with the FDA approved guidance for the current laboratory ?assay. ? ?If heparin results are below expected values, and patient dosage has  ?been confirmed, suggest follow up testing of antithrombin III levels. ?Performed at [facility], [address] [place], [place] ?[address] ?  ?Glucose, capillary     Status: Abnormal  ? Collection Time: 02/23/22 11:14 PM  ?Result Value Ref Range  ? Glucose-Capillary 141 (H) 70 - 99 mg/dL  ?  Comment: Glucose reference range applies only to samples taken after fasting for at least 8 hours.  ?Glucose, capillary     Status: Abnormal  ? Collection Time: 02/24/22  3:15 AM  ?Result Value Ref Range  ? Glucose-Capillary 125 (H) 70 - 99 mg/dL  ?  Comment: Glucose reference range applies only to samples taken after fasting for at least 8 hours.  ?Triglycerides     Status: Abnormal  ? Collection Time: 02/24/22  5:12 AM  ?Result Value Ref Range  ? Triglycerides 197 (H) <150 mg/dL  ?  Comment: Performed at [facility], [address] [place], [place] [address]  ?Vancomycin, random     Status: None  ? Collection Time: 02/24/22  5:12 AM  ?Result Value Ref Range  ? Vancomycin Rm 18   ?  Comment:        ?Random Vancomycin therapeutic ?range is dependent on dosage and ?time of specimen collection. ?A peak range is  20.0-40.0 ug/mL ?A trough range is 5.0-15.0 ug/mL ?       ?Performed at [facility], [address] , [place] [address] ?  ?Lactic acid, plasma     Status: Abnormal  ? Collection Time: 02/24/22  5:

## 2022-02-25 NOTE — Progress Notes (Signed)
Patient ID: Jeffrey Ayala, male   DOB: 05-27-1961, 61 y.o.   MRN: [id number] ? ?  ?Subjective: ?Pt remains intubated/sedated.  CT imaging was repeated yesterday with no evidence of abscess, gas formation, or other indication of scrotal/subcutaneous infection.  RUQ ultrasound unremarkable.  Pt requiring increased pressor support overnight but able to titrate down this morning.  Still on three pressors now. ? ?Objective: ?Vital signs in last 24 hours: ?Temp:  [99.1 ?F (37.3 ?C)-101.5 ?F (38.6 ?C)] 101.3 ?F (38.5 ?C) (04/05 0545) ?Pulse Rate:  [123-158] 139 (04/05 0545) ?Resp:  [16-36] 25 (04/05 0545) ?BP: (104-126)/(58-68) 126/68 (04/04 1536) ?SpO2:  [88 %-100 %] 99 % (04/05 0545) ?Arterial Line BP: (85-142)/(46-72) 101/50 (04/05 0545) ?FiO2 (%):  [40 %-60 %] 50 % (04/05 0418) ?Weight:  [126.4 kg] 126.4 kg (04/05 0432) ? ?Intake/Output from previous day: ?04/04 0701 - 04/05 0700 ?In: 5477.8 [I.V.:5088.1; NG/GT:245.5; IV Piggyback:144.2] ?Out: 625 [Urine:625] ?Intake/Output this shift: ?No intake/output data recorded. ? ?Physical Exam:  ?GU: Scrotum with continued significant edema.  Still anterior ecchymosis but still no fluctuance, induration, drainage.  Weeping associated with edema and very superficial skin breakdown but no necrotic changes. ? ?Lab Results: ?Recent Labs  ?  02/23/22 ?0442 02/24/22 ?0605 02/25/22 ?0434  ?HGB 13.6 11.4* 10.0*  ?HCT 40.0 33.1* 30.6*  ? ? ?  Latest Ref Rng & Units 02/25/2022  ?  4:34 AM 02/24/2022  ?  9:32 AM 02/24/2022  ?  6:05 AM  ?CBC  ?WBC 4.0 - 10.5 K/uL 21.9    19.7    ?Hemoglobin 13.0 - 17.0 g/dL 10.0    11.4    ?Hematocrit 39.0 - 52.0 % 30.6    33.1    ?Platelets 150 - 400 K/uL 63   77   82    ? ? ? ?BMET ?Recent Labs  ?  02/24/22 ?7106 02/24/22 ?1601  ?NA 140 140  ?K 4.0 4.2  ?CL 103 101  ?CO2 19* 19*  ?GLUCOSE 117* 109*  ?BUN 45* 60*  ?CREATININE 4.08* 4.84*  ?CALCIUM 6.7* 6.6*  ? ? ? ?Studies/Results: ?Urine culture with multiple species.  Blood cultures still  negative. ? ?Assessment/Plan: ?1) Scrotal edema/ecchymosis: CT imaging repeated yesterday and still no evidence to suggest acute infectious process of scrotum.  Exam still would suggest that scrotal changes would not highly be likely for source of infection.  However, explanation for ecchymotic scrotal changes still unclear. Surgical intervention/debridement of scrotum would be with very high risk in setting of severe sepsis and with relatively low benefit based on clinical exam/imaging. However, if patient does not clinically improve and there remains no clear etiology for severe sepsis, could consider surgical exploration/debridement as a last resort/effort to identify source. ? ? LOS: 3 days  ? ?[name] ?02/25/2022, 7:09 AM ? ?  ?

## 2022-02-26 ENCOUNTER — Inpatient Hospital Stay (HOSPITAL_COMMUNITY): Payer: BC Managed Care – PPO

## 2022-02-26 DIAGNOSIS — A419 Sepsis, unspecified organism: Secondary | ICD-10-CM | POA: Diagnosis not present

## 2022-02-26 DIAGNOSIS — Z452 Encounter for adjustment and management of vascular access device: Secondary | ICD-10-CM

## 2022-02-26 DIAGNOSIS — Z9911 Dependence on respirator [ventilator] status: Secondary | ICD-10-CM | POA: Diagnosis not present

## 2022-02-26 DIAGNOSIS — Z1211 Encounter for screening for malignant neoplasm of colon: Secondary | ICD-10-CM

## 2022-02-26 DIAGNOSIS — J9601 Acute respiratory failure with hypoxia: Secondary | ICD-10-CM | POA: Diagnosis not present

## 2022-02-26 DIAGNOSIS — I4891 Unspecified atrial fibrillation: Secondary | ICD-10-CM | POA: Diagnosis not present

## 2022-02-26 DIAGNOSIS — R6521 Severe sepsis with septic shock: Secondary | ICD-10-CM | POA: Diagnosis not present

## 2022-02-26 LAB — APTT
aPTT: 69 seconds — ABNORMAL HIGH (ref 24–36)
aPTT: 59 seconds — ABNORMAL HIGH (ref 24–36)

## 2022-02-26 LAB — RENAL FUNCTION PANEL
Albumin: 2.1 g/dL — ABNORMAL LOW (ref 3.5–5.0)
Anion gap: 19 — ABNORMAL HIGH (ref 5–15)
BUN: 110 mg/dL — ABNORMAL HIGH (ref 6–20)
CO2: 23 mmol/L (ref 22–32)
Calcium: 6.5 mg/dL — ABNORMAL LOW (ref 8.9–10.3)
Chloride: 91 mmol/L — ABNORMAL LOW (ref 98–111)
Creatinine, Ser: 6.87 mg/dL — ABNORMAL HIGH (ref 0.61–1.24)
GFR, Estimated: 9 mL/min — ABNORMAL LOW (ref >60)
Glucose, Bld: 166 mg/dL — ABNORMAL HIGH (ref 70–99)
Phosphorus: 7.1 mg/dL — ABNORMAL HIGH (ref 2.5–4.6)
Potassium: 5.5 mmol/L — ABNORMAL HIGH (ref 3.5–5.1)
Sodium: 133 mmol/L — ABNORMAL LOW (ref 135–145)

## 2022-02-26 LAB — DIC (DISSEMINATED INTRAVASCULAR COAGULATION)PANEL
D-Dimer, Quant: 4.11 ug/mL-FEU — ABNORMAL HIGH (ref 0.00–0.50)
Fibrinogen: 675 mg/dL — ABNORMAL HIGH (ref 210–475)
INR: 1.3 — ABNORMAL HIGH (ref 0.8–1.2)
Platelets: 40 10*3/uL — ABNORMAL LOW (ref 150–400)
Prothrombin Time: 15.8 seconds — ABNORMAL HIGH (ref 11.4–15.2)
Smear Review: NONE SEEN
aPTT: 119 seconds — ABNORMAL HIGH (ref 24–36)

## 2022-02-26 LAB — CBC
HCT: 27.9 % — ABNORMAL LOW (ref 39.0–52.0)
Hemoglobin: 9.4 g/dL — ABNORMAL LOW (ref 13.0–17.0)
MCH: 30 pg (ref 26.0–34.0)
MCHC: 33.7 g/dL (ref 30.0–36.0)
MCV: 89.1 fL (ref 80.0–100.0)
Platelets: 43 10*3/uL — ABNORMAL LOW (ref 150–400)
RBC: 3.13 MIL/uL — ABNORMAL LOW (ref 4.22–5.81)
RDW: 16.9 % — ABNORMAL HIGH (ref 11.5–15.5)
WBC: 15.8 10*3/uL — ABNORMAL HIGH (ref 4.0–10.5)
nRBC: 0.1 % (ref 0.0–0.2)

## 2022-02-26 LAB — BASIC METABOLIC PANEL
Anion gap: 20 — ABNORMAL HIGH (ref 5–15)
BUN: 98 mg/dL — ABNORMAL HIGH (ref 6–20)
CO2: 22 mmol/L (ref 22–32)
Calcium: 6.2 mg/dL — CL (ref 8.9–10.3)
Chloride: 92 mmol/L — ABNORMAL LOW (ref 98–111)
Creatinine, Ser: 6.65 mg/dL — ABNORMAL HIGH (ref 0.61–1.24)
GFR, Estimated: 9 mL/min — ABNORMAL LOW (ref >60)
Glucose, Bld: 160 mg/dL — ABNORMAL HIGH (ref 70–99)
Potassium: 3.9 mmol/L (ref 3.5–5.1)
Sodium: 134 mmol/L — ABNORMAL LOW (ref 135–145)

## 2022-02-26 LAB — GLUCOSE, CAPILLARY
Glucose-Capillary: 141 mg/dL — ABNORMAL HIGH (ref 70–99)
Glucose-Capillary: 144 mg/dL — ABNORMAL HIGH (ref 70–99)
Glucose-Capillary: 148 mg/dL — ABNORMAL HIGH (ref 70–99)
Glucose-Capillary: 154 mg/dL — ABNORMAL HIGH (ref 70–99)
Glucose-Capillary: 156 mg/dL — ABNORMAL HIGH (ref 70–99)
Glucose-Capillary: 168 mg/dL — ABNORMAL HIGH (ref 70–99)

## 2022-02-26 LAB — ANTITHROMBIN III: AntiThromb III Func: 36 % — ABNORMAL LOW (ref 75–120)

## 2022-02-26 LAB — HEPATIC FUNCTION PANEL
ALT: 49 U/L — ABNORMAL HIGH (ref 0–44)
AST: 85 U/L — ABNORMAL HIGH (ref 15–41)
Albumin: 2.2 g/dL — ABNORMAL LOW (ref 3.5–5.0)
Alkaline Phosphatase: 152 U/L — ABNORMAL HIGH (ref 38–126)
Bilirubin, Direct: 9.7 mg/dL — ABNORMAL HIGH (ref 0.0–0.2)
Indirect Bilirubin: 4.2 mg/dL — ABNORMAL HIGH (ref 0.3–0.9)
Total Bilirubin: 13.9 mg/dL — ABNORMAL HIGH (ref 0.3–1.2)
Total Protein: 4.9 g/dL — ABNORMAL LOW (ref 6.5–8.1)

## 2022-02-26 LAB — TYPE AND SCREEN
ABO/RH(D): A POS
Antibody Screen: NEGATIVE

## 2022-02-26 LAB — COOXEMETRY PANEL
Carboxyhemoglobin: 2.2 % — ABNORMAL HIGH (ref 0.5–1.5)
Methemoglobin: <0.7 % (ref 0.0–1.5)
O2 Saturation: 85.9 %
Total hemoglobin: 8.2 g/dL — ABNORMAL LOW (ref 12.0–16.0)

## 2022-02-26 LAB — ABO/RH: ABO/RH(D): A POS

## 2022-02-26 LAB — MAGNESIUM: Magnesium: 2.2 mg/dL (ref 1.7–2.4)

## 2022-02-26 LAB — PHOSPHORUS: Phosphorus: 5.4 mg/dL — ABNORMAL HIGH (ref 2.5–4.6)

## 2022-02-26 LAB — HEPARIN LEVEL (UNFRACTIONATED): Heparin Unfractionated: 0.2 IU/mL — ABNORMAL LOW (ref 0.30–0.70)

## 2022-02-26 MED ORDER — FENTANYL 2500MCG IN NS 250ML (10MCG/ML) PREMIX INFUSION
50.0000 ug/h | INTRAVENOUS | Status: DC
  Administered 2022-02-26: 50 ug/h via INTRAVENOUS
  Administered 2022-02-27: 125 ug/h via INTRAVENOUS
  Administered 2022-02-28 (×3): 200 ug/h via INTRAVENOUS
  Administered 2022-03-01: 50 ug/h via INTRAVENOUS
  Administered 2022-03-02: 200 ug/h via INTRAVENOUS
  Filled 2022-02-26 (×7): qty 250

## 2022-02-26 MED ORDER — ASCORBIC ACID 500 MG PO TABS
250.0000 mg | ORAL_TABLET | Freq: Two times a day (BID) | ORAL | Status: DC
Start: 2022-02-26 — End: 2022-03-02
  Administered 2022-02-26 – 2022-03-01 (×7): 250 mg
  Filled 2022-02-26 (×7): qty 1

## 2022-02-26 MED ORDER — CALCIUM GLUCONATE-NACL 2-0.675 GM/100ML-% IV SOLN
2.0000 g | Freq: Once | INTRAVENOUS | Status: AC
  Administered 2022-02-26: 2000 mg via INTRAVENOUS
  Filled 2022-02-26: qty 100

## 2022-02-26 MED ORDER — PIPERACILLIN-TAZOBACTAM 3.375 G IVPB: 3.3750 g | Freq: Three times a day (TID) | INTRAVENOUS | Status: DC

## 2022-02-26 MED ORDER — PRISMASOL BGK 4/2.5 32-4-2.5 MEQ/L REPLACEMENT SOLN
Status: DC
  Filled 2022-02-26 (×5): qty 5000

## 2022-02-26 MED ORDER — METHYLENE BLUE 0.5 % INJ SOLN
0.5000 mg/kg/h | INTRAVENOUS | Status: AC
  Administered 2022-02-26 – 2022-02-27 (×5): 0.5 mg/kg/h via INTRAVENOUS
  Filled 2022-02-26 (×6): qty 50

## 2022-02-26 MED ORDER — FENTANYL BOLUS VIA INFUSION
50.0000 ug | INTRAVENOUS | Status: DC | PRN
  Administered 2022-02-26 – 2022-02-27 (×3): 100 ug via INTRAVENOUS
  Administered 2022-02-28: 50 ug via INTRAVENOUS
  Administered 2022-02-28 (×3): 100 ug via INTRAVENOUS
  Administered 2022-02-28: 50 ug via INTRAVENOUS
  Administered 2022-02-28 – 2022-03-01 (×11): 100 ug via INTRAVENOUS
  Administered 2022-03-01: 50 ug via INTRAVENOUS
  Administered 2022-03-01: 100 ug via INTRAVENOUS
  Administered 2022-03-01: 50 ug via INTRAVENOUS
  Administered 2022-03-01 (×3): 100 ug via INTRAVENOUS
  Administered 2022-03-01: 75 ug via INTRAVENOUS
  Administered 2022-03-01: 50 ug via INTRAVENOUS
  Administered 2022-03-02 (×2): 100 ug via INTRAVENOUS
  Filled 2022-02-26: qty 100

## 2022-02-26 MED ORDER — METHYLENE BLUE 0.5 % INJ SOLN
0.5000 mg/kg/h | Freq: Four times a day (QID) | INTRAVENOUS | Status: DC
  Administered 2022-02-26: 0.5 mg/kg/h via INTRAVENOUS
  Filled 2022-02-26: qty 50

## 2022-02-26 MED ORDER — PRISMASOL BGK 4/2.5 32-4-2.5 MEQ/L EC SOLN
Status: DC
  Filled 2022-02-26 (×24): qty 5000

## 2022-02-26 MED ORDER — VITAL 1.5 CAL PO LIQD
1000.0000 mL | ORAL | Status: DC
  Administered 2022-02-26: 1000 mL

## 2022-02-26 MED ORDER — SODIUM CHLORIDE 0.9% IV SOLUTION: Freq: Once | INTRAVENOUS | Status: AC

## 2022-02-26 MED ORDER — HEPARIN SODIUM (PORCINE) 1000 UNIT/ML DIALYSIS
1000.0000 [IU] | INTRAMUSCULAR | Status: DC | PRN
  Administered 2022-02-28: 3000 [IU] via INTRAVENOUS_CENTRAL
  Administered 2022-03-03: 2800 [IU] via INTRAVENOUS_CENTRAL
  Filled 2022-02-26 (×4): qty 6

## 2022-02-26 MED ORDER — PIPERACILLIN-TAZOBACTAM 3.375 G IVPB 30 MIN
3.3750 g | Freq: Four times a day (QID) | INTRAVENOUS | Status: DC
  Administered 2022-02-26 – 2022-03-03 (×20): 3.375 g via INTRAVENOUS
  Filled 2022-02-26 (×36): qty 50

## 2022-02-26 MED ORDER — PRISMASOL BGK 4/2.5 32-4-2.5 MEQ/L REPLACEMENT SOLN
Status: DC
  Filled 2022-02-26 (×6): qty 5000

## 2022-02-26 MED ORDER — B COMPLEX-C PO TABS
1.0000 | ORAL_TABLET | Freq: Every day | ORAL | Status: DC
  Administered 2022-02-27 – 2022-03-01 (×3): 1
  Filled 2022-02-26 (×3): qty 1

## 2022-02-26 MED ORDER — SODIUM CHLORIDE 0.9 % IV SOLN
0.0600 mg/kg/h | INTRAVENOUS | Status: DC
  Administered 2022-02-26 – 2022-02-28 (×2): 0.05 mg/kg/h via INTRAVENOUS
  Administered 2022-03-01 – 2022-03-02 (×2): 0.06 mg/kg/h via INTRAVENOUS
  Filled 2022-02-26 (×5): qty 250

## 2022-02-26 MED ORDER — PROSOURCE TF PO LIQD
90.0000 mL | Freq: Three times a day (TID) | ORAL | Status: DC
  Administered 2022-02-26 – 2022-03-01 (×11): 90 mL
  Filled 2022-02-26 (×11): qty 90

## 2022-02-26 MED ORDER — FOLIC ACID 1 MG PO TABS
1.0000 mg | ORAL_TABLET | Freq: Every day | ORAL | Status: DC
  Administered 2022-02-27 – 2022-03-01 (×3): 1 mg
  Filled 2022-02-26 (×3): qty 1

## 2022-02-26 NOTE — Consult Note (Signed)
Consultation: Scrotal edema, sepsis ?Requested by: Dr. Noemi Chapel ? ?History of Present Illness: Checks a 61 year old male who was admitted 02/22/2022 or 4 days ago with sepsis.  He had profound shock and required 3 vasopressors.  His blood cultures have been normal.  Urine culture was negative.  Chest cleared.  His initial urinalysis showed a few bacteria, 21-50 red cells and 21-50 white cells.  Urology was consulted and has been following closely.  Patient's wife reports the patient was complaining of some generalized scrotal pain but nothing specific.  On exam he was noted to have a moderately edematous scrotum.  His scrotum was mild to moderately edematous and mild dark discoloration of the right hemiscrotum.  There was thought to be some tenderness on exam.  It was also noted there was an ecchymotic skin breakdown just superior to his buttock region in the midline.  A CTA of the pelvis was done 02/22/2022 which simply showed scrotal edema.  There was no abscess or air.  Similarly a scrotal ultrasound on 02/22/2022 revealed bilateral hydroceles and scrotal wall thickening again without any abscess, fluid collection or air.  By the next day on 02/23/2022 he had slightly more scrotal edema and a clear demarcation of an ecchymotic line, but again there was no fluctuance, skin necrosis or crepitus.  His exam is remained fairly stable.  To be certain on 01/25/3613 a repeat CT of the pelvis was done which again showed scrotal edema without abscess, fluid collection or emphysema.  The patient is slowly improving.  He has been on BiPAP today, he is down to 2 pressors, white count is normal.  No fever. ? ?Past Medical History:  ?Diagnosis Date  ? Concussion   ? Eosinophilic esophagitis   ? Gastric ulcer   ? History of kidney stones   ? Migraine   ? ?Past Surgical History:  ?Procedure Laterality Date  ? CYSTOSCOPY    ? DENTAL SURGERY    ? FINGER SURGERY    ? HERNIA REPAIR    ? at age 58  ? KNEE ARTHROSCOPY Left   ? LITHOTRIPSY    ?  NEPHROLITHOTOMY    ? TONSILLECTOMY    ? TOTAL KNEE ARTHROPLASTY Bilateral 11/04/2018  ? Procedure: BILATERAL TOTAL KNEE ARTHROPLASTY;  Surgeon: [name], MD;  Location: [facility];  Service: Orthopedics;  Laterality: Bilateral;  spinal/epidural per anesthesiologist  ? ? ?Home Medications:  ?Medications Prior to Admission  ?Medication Sig Dispense Refill Last Dose  ? acetaminophen (TYLENOL) 650 MG CR tablet Take 650 mg by mouth every 8 (eight) hours as needed for pain or fever.   02/22/2022  ? Calcium Carbonate-Vitamin D (OSCAL 500 D-3 PO) Take 1 tablet by mouth daily.   Past Week  ? naproxen sodium (ALEVE) 220 MG tablet Take 220 mg by mouth daily as needed (pain.).   02/22/2022  ? OVER THE COUNTER MEDICATION Take 1 capsule by mouth daily. Prostate vitamin   Past Week  ? allopurinol (ZYLOPRIM) 100 MG tablet Take 1 tablet (100 mg total) by mouth daily. (Patient not taking: Reported on 02/22/2022) 30 tablet 11 Not Taking  ? baclofen (LIORESAL) 10 MG tablet TAKE 1 TABLET BY MOUTH THREE TIMES A DAY AS NEEDED (Patient not taking: Reported on 02/22/2022) 90 tablet 0 Not Taking  ? colchicine-probenecid 0.5-500 MG tablet Take 1 tablet by mouth 2 (two) times daily. (Patient not taking: Reported on 02/22/2022) 30 tablet 1 Not Taking  ? fluticasone (FLOVENT HFA) 220 MCG/ACT inhaler Inhale 2 puffs  into the lungs in the morning and at bedtime. Do not eat or drink 30 minutes after inhalation (Patient not taking: Reported on 02/22/2022) 1 Inhaler 12 Not Taking  ? pantoprazole (PROTONIX) 40 MG tablet TAKE 1 TABLET BY MOUTH TWICE A DAY (Patient not taking: Reported on 02/22/2022) 180 tablet 1 Not Taking  ? ?Allergies:  ?Allergies  ?Allergen Reactions  ? Codeine Hives  ?  Insomnia, jittery  ? Flomax [Tamsulosin Hcl] Nausea Only  ?  Nausea and fatigue  ? ? ?Family History  ?Problem Relation Age of Onset  ? COPD Mother   ? Sarcoidosis Brother   ? Cancer Brother   ? Stomach cancer Neg Hx   ? Pancreatic cancer Neg Hx   ? Esophageal cancer Neg Hx   ?  Colon cancer Neg Hx   ? Rectal cancer Neg Hx   ? ?Social History:  reports that he has never smoked. He has never used smokeless tobacco. He reports current alcohol use. He reports that he does not use drugs. ? ?ROS: ?A complete review of systems was performed.  All systems are negative except for pertinent findings as noted. ?Review of Systems  ?Unable to perform ROS: Intubated  ? ? ?Physical Exam:  ?Vital signs in last 24 hours: ?Temp:  [97.1 ?F (36.2 ?C)-99.5 ?F (37.5 ?C)] 97.6 ?F (36.4 ?C) (04/06 1600) ?Pulse Rate:  [83-115] 99 (04/06 1815) ?Resp:  [0-26] 23 (04/06 1815) ?BP: (132)/(74) 132/74 (04/06 1245) ?SpO2:  [92 %-100 %] 98 % (04/06 1815) ?Arterial Line BP: (82-348)/(47-342) 107/62 (04/06 1815) ?FiO2 (%):  [40 %] 40 % (04/06 1600) ?Weight:  [133.6 kg] 133.6 kg (04/06 0457) ?General:  intubated  ?HEENT: Normocephalic, atraumatic ?Cardiovascular: Regular rate and rhythm  ?Lungs: Regular rate on ventilator ?Abdomen: Soft, nontender, nondistended, no abdominal masses ?Extremities: 2+ anasarca, mottled heels and feet ?Neurologic: intubated  ?GU: Mild penile edema, foreskin, glans and meatus all appeared normal.  Scrotum is edematous with clearly demarcated ecchymosis on the anterior surface reaches around posteriorly.  The superficial layer is already sloughing off with new skin underneath there is some patchy areas consistent with stage II superficial pressure necrosis with pink/red wound bed underneath.  Again this is very superficial layer.  There is no fluid collection, fluctuance or crepitus.  Some of what we are palpating is hydrocele.  There is no full-thickness or deep skin necrosis. ? ?Laboratory Data:  ?Results for orders placed or performed during the hospital encounter of 02/22/22 (from the past 24 hour(s))  ?Glucose, capillary     Status: Abnormal  ? Collection Time: 02/25/22  7:21 PM  ?Result Value Ref Range  ? Glucose-Capillary 157 (H) 70 - 99 mg/dL  ?Glucose, capillary     Status: Abnormal  ?  Collection Time: 02/25/22 11:57 PM  ?Result Value Ref Range  ? Glucose-Capillary 198 (H) 70 - 99 mg/dL  ?Glucose, capillary     Status: Abnormal  ? Collection Time: 02/26/22  3:25 AM  ?Result Value Ref Range  ? Glucose-Capillary 141 (H) 70 - 99 mg/dL  ?CBC     Status: Abnormal  ? Collection Time: 02/26/22  4:27 AM  ?Result Value Ref Range  ? WBC 15.8 (H) 4.0 - 10.5 K/uL  ? RBC 3.13 (L) 4.22 - 5.81 MIL/uL  ? Hemoglobin 9.4 (L) 13.0 - 17.0 g/dL  ? HCT 27.9 (L) 39.0 - 52.0 %  ? MCV 89.1 80.0 - 100.0 fL  ? MCH 30.0 26.0 - 34.0 pg  ? MCHC 33.7 30.0 -  36.0 g/dL  ? RDW 16.9 (H) 11.5 - 15.5 %  ? Platelets 43 (L) 150 - 400 K/uL  ? nRBC 0.1 0.0 - 0.2 %  ?Heparin level (unfractionated)     Status: Abnormal  ? Collection Time: 02/26/22  4:27 AM  ?Result Value Ref Range  ? Heparin Unfractionated 0.20 (L) 0.30 - 0.70 IU/mL  ?Magnesium     Status: None  ? Collection Time: 02/26/22  4:27 AM  ?Result Value Ref Range  ? Magnesium 2.2 1.7 - 2.4 mg/dL  ?Phosphorus     Status: Abnormal  ? Collection Time: 02/26/22  4:27 AM  ?Result Value Ref Range  ? Phosphorus 5.4 (H) 2.5 - 4.6 mg/dL  ?Marland KitchenCooxemetry Panel (carboxy, met, total hgb, O2 sat)     Status: Abnormal  ? Collection Time: 02/26/22  4:27 AM  ?Result Value Ref Range  ? Total hemoglobin 8.2 (L) 12.0 - 16.0 g/dL  ? O2 Saturation 85.9 %  ? Carboxyhemoglobin 2.2 (H) 0.5 - 1.5 %  ? Methemoglobin <0.7 0.0 - 1.5 %  ?Basic metabolic panel     Status: Abnormal  ? Collection Time: 02/26/22  4:27 AM  ?Result Value Ref Range  ? Sodium 134 (L) 135 - 145 mmol/L  ? Potassium 3.9 3.5 - 5.1 mmol/L  ? Chloride 92 (L) 98 - 111 mmol/L  ? CO2 22 22 - 32 mmol/L  ? Glucose, Bld 160 (H) 70 - 99 mg/dL  ? BUN 98 (H) 6 - 20 mg/dL  ? Creatinine, Ser 6.65 (H) 0.61 - 1.24 mg/dL  ? Calcium 6.2 (LL) 8.9 - 10.3 mg/dL  ? GFR, Estimated 9 (L) >60 mL/min  ? Anion gap 20 (H) 5 - 15  ?Hepatic function panel     Status: Abnormal  ? Collection Time: 02/26/22  4:27 AM  ?Result Value Ref Range  ? Total Protein 4.9 (L) 6.5 -  8.1 g/dL  ? Albumin 2.2 (L) 3.5 - 5.0 g/dL  ? AST 85 (H) 15 - 41 U/L  ? ALT 49 (H) 0 - 44 U/L  ? Alkaline Phosphatase 152 (H) 38 - 126 U/L  ? Total Bilirubin 13.9 (H) 0.3 - 1.2 mg/dL  ? Bilirubin, Dire

## 2022-02-26 NOTE — Progress Notes (Signed)
?[facility] ?Progress Note  ? ? ?Assessment/ Plan:   ?AKI: in the setting of severe septic shock- oliguric now- no hard indications for starting RRT today but will need in next 24 hrs ?            - start CRRT today 02/26/22 ? - 4K bath, no heparin- plts 43, on systemic hep gtt too ? - has received ca gluconate today- close attention to Ca repletion ?            - will be very judicious with BFR and UF to start given vasoplegia- start at 250 mL/ min bfr and keep even to start ?            - sending CK in setting of hypocalcemia- 900s ? - stop bicarb gtt once on CRRT ?  ?2.  Septic shock ?            - severe ?            - on pressors, stress dose steroids, IV antibiotics (linezolid and zosyn) methylene blue ?            - likely source is scrotum ?  ?3.  Scrotal infection ?            - urology following, reimaging tomorrow ?  ?4.  Acute hypoxic RF ?            - intubated per PCCM ? ?5.  Thrombocytopenia/ coagulopathy: abd [place] with cirrhosis, no schistocytes on smear, likely sepsis related ? ?6.  Hypocalcemia: aggressive repletion ? ?7.  Afib with RVR- hep gtt ?  ?8. Dispo: ICU ? ?Subjective:   ? ?Seen in room.  Not really a lot of UOP to Lasix challenge, Cr up to 6.7,  still hypocalcemic. CK up a little in the 900s, prop changed to fentanyl  ? ?Objective:   ?BP 126/68   Pulse (!) 109   Temp 97.8 ?F (36.6 ?C)   Resp (!) 25   Ht '5\' 10"'  (1.778 m)   Wt 133.6 kg   SpO2 97%   BMI 42.26 kg/m?  ? ?Intake/Output Summary (Last 24 hours) at 02/26/2022 1127 ?Last data filed at 02/26/2022 0700 ?Gross per 24 hour  ?Intake 4947.87 ml  ?Output 490 ml  ?Net 4457.87 ml  ? ?Weight change: 7.2 kg ? ?Physical Exam: ?GEN: ill-appearing, intermittently biting on tube ?HEENT eyes closed ?PULM coarse bilaterally, intubated ?CV tachycardic ?ABD distended, still soft, hypoactive bowel sounds ?GU: swollen scrotum with deeply violaceous discoloration with some weeping ?EXT 2 + anasarca, mottled and cool bilateral soles of  feet and toes ?NEURO intubated and sedated ? ?Imaging: ?US Abdomen Limited RUQ (LIVER/GB) ? ?Result Date: 02/24/2022 ?CLINICAL DATA:  Hyperbilirubinemia EXAM: ULTRASOUND ABDOMEN LIMITED RIGHT UPPER QUADRANT COMPARISON:  None. FINDINGS: Gallbladder: No gallstones or wall thickening visualized. Sonographic Percell Miller sign could not be assessed due to patient unresponsiveness. Common bile duct: Diameter: 3.2 mm Liver: Hypoechoic area measuring 0.9 x 0.9 x 1.4 cm and measuring 3.4 x 0.6 x 1.0 cm seen at the gallbladder fossa. Nodular liver contour. Increased parenchymal echogenicity. Portal vein is patent on color Doppler imaging with normal direction of blood flow towards the liver. Other: Small volume ascites. IMPRESSION: 1. Two focal hypoechoic geographic areas seen at the gallbladder fossa, likely due to focal fatty sparing, although liver mass cannot be excluded. Recommend further evaluation with contrast-enhanced liver MRI which can be performed non emergently. 2. Hepatic steatosis. 3. Mildly nodular  liver contour, suggestive of cirrhosis. Electronically Signed   By: Yetta Glassman M.D.   On: 02/24/2022 11:39   ? ?Labs: ?BMET ?Recent Labs  ?Lab 02/23/22 ?8022 02/23/22 ?3361 02/23/22 ?1208 02/23/22 ?1846 02/24/22 ?2244 02/24/22 ?1601 02/25/22 ?9753 02/25/22 ?0051 02/26/22 ?1021  ?NA 137 139 138 138 140 140  --  137 134*  ?K 2.9* 3.4* 3.2* 3.7 4.0 4.2  --  3.8 3.9  ?CL 104  --  104 104 103 101  --  99 92*  ?CO2 15*  --  15* 19* 19* 19*  --  21* 22  ?GLUCOSE 191*  --  245* 173* 117* 109*  --  168* 160*  ?BUN 21*  --  29* 36* 45* 60*  --  79* 98*  ?CREATININE 2.61*  --  3.46* 3.43* 4.08* 4.84*  --  5.67* 6.65*  ?CALCIUM 7.2*  --  7.4* 7.2* 6.7* 6.6*  --  6.2* 6.2*  ?PHOS 7.0*  --   --   --  4.9*  --  4.8*  --  5.4*  ? ?CBC ?Recent Labs  ?Lab 02/22/22 ?1805 02/22/22 ?1819 02/23/22 ?1173 02/23/22 ?5670 02/24/22 ?1410 02/24/22 ?3013 02/25/22 ?1438 02/26/22 ?8875 02/26/22 ?7972  ?WBC 2.3*  --  17.6*  --  19.7*  --  21.9*  15.8*  --   ?NEUTROABS 2.2  --   --   --   --   --   --   --   --   ?HGB 13.1   < > 12.5* 13.6 11.4*  --  10.0* 9.4*  --   ?HCT 40.6   < > 38.8* 40.0 33.1*  --  30.6* 27.9*  --   ?MCV 92.5  --  93.9  --  88.7  --  91.6 89.1  --   ?PLT 193  --  163  --  82* 77* 63* 43* 40*  ? < > = values in this interval not displayed.  ? ? ?Medications:   ? ? sodium chloride   Intravenous Once  ? chlorhexidine gluconate (MEDLINE KIT)  15 mL Mouth Rinse BID  ? Chlorhexidine Gluconate Cloth  6 each Topical Q0600  ? fludrocortisone  0.1 mg Per Tube Daily  ? hydrocortisone sod succinate (SOLU-CORTEF) inj  100 mg Intravenous Q12H  ? insulin aspart  0-15 Units Subcutaneous Q4H  ? mouth rinse  15 mL Mouth Rinse 10 times per day  ? methylene blue (PROVAY) 250 mg / 250 mL infusion  0.5 mg/kg/hr Intravenous Q4H  ? pantoprazole (PROTONIX) IV  40 mg Intravenous QHS  ? sodium chloride flush  10-40 mL Intracatheter Q12H  ? ? ? ? ?Madelon Lips MD ?02/26/2022, 11:27 AM   ?

## 2022-02-26 NOTE — Progress Notes (Signed)
Pt placed in PSV, pt tolerating well at this time. MD aware, RN at bedside, RT will continue to monitor.  ?

## 2022-02-26 NOTE — Procedures (Signed)
Central Venous Catheter Insertion Procedure Note ? ?Jeffrey Ayala  ?[id number]  ?Jan 23, 1961 ? ?Date:02/26/22  ?Time:2:44 PM  ? ?Provider Performing:Acey Woodfield D Rollene Rotunda  ? ?Procedure: Insertion of Non-tunneled Central Venous Catheter(36556)with US guidance (63149)   ? ?Indication(s) ?Hemodialysis ? ?Consent ?Risks of the procedure as well as the alternatives and risks of each were explained to the patient and/or caregiver.  Consent for the procedure was obtained and is signed in the bedside chart ? ?Anesthesia ?Topical only with 1% lidocaine  ? ?Timeout ?Verified patient identification, verified procedure, site/side was marked, verified correct patient position, special equipment/implants available, medications/allergies/relevant history reviewed, required imaging and test results available. ? ?Sterile Technique ?Maximal sterile technique including full sterile barrier drape, hand hygiene, sterile gown, sterile gloves, mask, hair covering, sterile ultrasound probe cover (if used). ? ?Procedure Description ?Area of catheter insertion was cleaned with chlorhexidine and draped in sterile fashion.   With real-time ultrasound guidance a HD catheter was placed into the left internal jugular vein.  Nonpulsatile blood flow and easy flushing noted in all ports.  The catheter was sutured in place and sterile dressing applied. ? ?Complications/Tolerance ?None; patient tolerated the procedure well. ?Chest X-ray is ordered to verify placement for internal jugular or subclavian cannulation.  Chest x-ray is not ordered for femoral cannulation. ? ?EBL ?Minimal ? ?Specimen(s) ?None ? ?Mikki Harbor, PA-C ?[facility] ?02/26/2022, 2:44 PM ? ?Please see [email or web address] for pager details. ? ?From 7A-7P if no response, please call [phone]. ?After hours, please call ELink [phone]. ? ?

## 2022-02-26 NOTE — Progress Notes (Signed)
eLink Physician-Brief Progress Note ?Patient Name: Jeffrey Ayala ?DOB: Oct 02, 1961 ?MRN: [id number] ? ? ?Date of Service ? 02/26/2022  ?HPI/Events of Note ? Hypocalcemia - Ca++ = 6.2 which corrects to 7.56 (Low) given Albumin = 2.3.  ?eICU Interventions ? Will replace Ca++.  ? ? ? ?Intervention Category ?Major Interventions: Electrolyte abnormality - evaluation and management ? ?Eron Goble Cornelia Copa ?02/26/2022, 7:00 AM ?

## 2022-02-26 NOTE — Progress Notes (Signed)
ANTICOAGULATION CONSULT NOTE ? ?Pharmacy Consult for heparin ?Indication: atrial fibrillation ? ?Allergies  ?Allergen Reactions  ? Codeine Hives  ?  Insomnia, jittery  ? Flomax [Tamsulosin Hcl] Nausea Only  ?  Nausea and fatigue  ? ? ?Patient Measurements: ?Height: '5\' 10"'$  (177.8 cm) ?Weight: 133.6 kg (294 lb 8.6 oz) ?IBW/kg (Calculated) : 73 ?Heparin Dosing Weight: 95 kg ? ?Vital Signs: ?Temp: 97.6 ?F (36.4 ?C) (04/06 1600) ?Temp Source: Bladder (04/06 1600) ?BP: 132/74 (04/06 1245) ?Pulse Rate: 99 (04/06 1815) ? ?Labs: ?Recent Labs  ?  02/24/22 ?0605 02/24/22 ?0605 02/24/22 ?9326 02/24/22 ?1244 02/25/22 ?0434 02/25/22 ?0645 02/25/22 ?1659 02/25/22 ?1748 02/26/22 ?7124 02/26/22 ?5809 02/26/22 ?1334 02/26/22 ?1600 02/26/22 ?2025  ?HGB 11.4*  --   --   --  10.0*  --   --   --  9.4*  --   --   --   --   ?HCT 33.1*  --   --   --  30.6*  --   --   --  27.9*  --   --   --   --   ?PLT 82*  --  77*  --  63*  --   --   --  43* 40*  --   --   --   ?APTT  --    < > >200*  --   --   --   --   --   --  119* 69*  --  59*  ?LABPROT  --   --  24.3*  --   --   --   --   --   --  15.8*  --   --   --   ?INR  --   --  2.2*  --   --   --   --   --   --  1.3*  --   --   --   ?HEPARINUNFRC  --   --   --    < > 0.11*  --   --  0.19* 0.20*  --   --   --   --   ?CREATININE  --   --   --    < >  --  5.67*  --   --  6.65*  --   --  6.87*  --   ?CKTOTAL  --   --   --   --   --   --  978*  --   --   --   --   --   --   ? < > = values in this interval not displayed.  ? ? ? ?Estimated Creatinine Clearance: 15.7 mL/min (A) (by C-G formula based on SCr of 6.87 mg/dL (H)). ? ? ?Assessment:  ?61 y.o. male with new onset Afib RVR, likely secondary to sepsis.  Urology seeing for hematuria.  DIC panel on 4/4 showed elevated aPTT at >200 s and elevated INR at 2.2. Stat heparin level ordered afterwards  and resulted subtherapeutic (0.24). Discussed with CCM and this is likely due to sepsis. ? ?This AM, heparin level continued to be subtherapeutic at 0.2  and rate was subsequently increased to 2650 units/h. AT3 ordered and 11, showing AT3 deficiency. Repeat DIC panel drawn and negative per CCM. Hgb remains stable this AM, platelets continue to decrease and low this AM at 43. Patient to get catheter to start CRRT later today. Heparin has been turned off in preparation for procedure. Discussed with CCM  and will transition to bivalirudin for anticoagulation post-procedure after aPTT re-check. ?    ?Patient switched to bivalirudin. Initial aPTT 59 secs. ? ?Plan: ?Continue bivalirudin IV 0.05 mg/kg/h (use total body weight) ?aPTT every 4 hours ?Goal aPTT 50-85 seconds (targeting lower end of goal due to concurrent thrombocytopenia) ?Daily CBC ?Monitor for signs and symptoms of bleeding ? ?Alanda Slim, PharmD, FCCM ?Clinical Pharmacist ?Please see AMION for all Pharmacists' Contact Phone Numbers ?02/26/2022, 9:07 PM   ? ?

## 2022-02-26 NOTE — Plan of Care (Signed)
?  Problem: Safety: ?Goal: Non-violent Restraint(s) ?02/26/2022 1324 by Rocky Morel, RN ?Outcome: Completed/Met ?02/26/2022 1324 by Rocky Morel, RN ?Outcome: Not Progressing ?  ?

## 2022-02-26 NOTE — Plan of Care (Signed)
?  Problem: Safety: ?Goal: Non-violent Restraint(s) ?Outcome: Not Progressing ?  ?Problem: Education: ?Goal: Knowledge of General Education information will improve ?Description: Including pain rating scale, medication(s)/side effects and non-pharmacologic comfort measures ?Outcome: Not Progressing ?  ?Problem: Health Behavior/Discharge Planning: ?Goal: Ability to manage health-related needs will improve ?Outcome: Not Progressing ?  ?Problem: Clinical Measurements: ?Goal: Ability to maintain clinical measurements within normal limits will improve ?Outcome: Not Progressing ?Goal: Will remain free from infection ?Outcome: Not Progressing ?Goal: Diagnostic test results will improve ?Outcome: Not Progressing ?Goal: Respiratory complications will improve ?Outcome: Not Progressing ?Goal: Cardiovascular complication will be avoided ?Outcome: Not Progressing ?  ?Problem: Activity: ?Goal: Risk for activity intolerance will decrease ?Outcome: Not Progressing ?  ?Problem: Nutrition: ?Goal: Adequate nutrition will be maintained ?Outcome: Not Progressing ?  ?Problem: Coping: ?Goal: Level of anxiety will decrease ?Outcome: Not Progressing ?  ?Problem: Elimination: ?Goal: Will not experience complications related to bowel motility ?Outcome: Not Progressing ?Goal: Will not experience complications related to urinary retention ?Outcome: Not Progressing ?  ?Problem: Pain Managment: ?Goal: General experience of comfort will improve ?Outcome: Not Progressing ?  ?Problem: Safety: ?Goal: Ability to remain free from injury will improve ?Outcome: Not Progressing ?  ?Problem: Skin Integrity: ?Goal: Risk for impaired skin integrity will decrease ?Outcome: Not Progressing ?  ?Problem: Fluid Volume: ?Goal: Hemodynamic stability will improve ?Outcome: Not Progressing ?  ?Problem: Clinical Measurements: ?Goal: Diagnostic test results will improve ?Outcome: Not Progressing ?Goal: Signs and symptoms of infection will decrease ?Outcome: Not  Progressing ?  ?Problem: Respiratory: ?Goal: Ability to maintain adequate ventilation will improve ?Outcome: Not Progressing ?  ?Problem: Activity: ?Goal: Ability to tolerate increased activity will improve ?Outcome: Not Progressing ?  ?Problem: Respiratory: ?Goal: Ability to maintain a clear airway and adequate ventilation will improve ?Outcome: Not Progressing ?  ?Problem: Role Relationship: ?Goal: Method of communication will improve ?Outcome: Not Progressing ?  ?

## 2022-02-26 NOTE — Progress Notes (Signed)
? ?NAME:  Jeffrey Ayala, MRN:  [id number], DOB:  July 22, 1961, LOS: 4 ?ADMISSION DATE:  02/22/2022, CONSULTATION DATE:  4/2 ?REFERRING MD:  Doren Custard- ED CHIEF COMPLAINT: Septic shock ? ?History of Present Illness:  ?Jeffrey Ayala, is a 61 y.o. male, who presented to the [facility] ED with a chief complaint of nausea, vomiting, diarrhea. ? ?They have a pertinent past medical history of gastric ulcer, kidney stones, eosinophilic esophagitis, migraines  ? ?History obtained from Jeffrey Ayala (spouse), on 4/1 patient began complaining of feeling unwell. Wife denies sick contacts, questionable food.  Wife denies taking any meds currently.  Around 2 PM on 4/2 the patient developed nausea, vomiting, and diarrhea while at a birthday party and soiled himself. EMS was called. When EMS arrived he was found to be in A-fib with a rate of 120-160. ? ?At the ED he was found to be febrile with a Tmax of 102.9.  A code sepsis was called in the emergency department, blood cultures were drawn, 2 L of IV fluid were given, vanc and cefepime were ordered.  CT chest negative for acute process.  CT abdomen pelvis with and without contrast notable for swollen scrotum concerning for infection, colon filled with diarrhea.  Urology and cardiology were consulted by the ED.  Despite fluid resuscitation the patient developed hypotension requiring vasopressors. ? ?PCCM was consulted for admission. ? ?Pertinent  Medical History  ?gastric ulcer, kidney stones, eosinophilic esophagitis, migraines  ? ?Significant Hospital Events: ?Including procedures, antibiotic start and stop dates in addition to other pertinent events   ?4/2 Presented to the ED. CT chest negative for acute process.  CT abdomen pelvis with and without contrast notable for swollen scrotum concerning for infection, colon filled with diarrhea.  Urology and cardiology were consulted by the ED. Vanc, cefepime started.  ?4/3 Intubated overnight, brief cardiac arrest. Escalating pressors. ? ?Interim  History / Subjective:  ?Tmax 101.5 ? ?Objective   ?Blood pressure 126/68, pulse 99, temperature 99.5 ?F (37.5 ?C), temperature source Bladder, resp. rate (!) 23, height '5\' 10"'$  (1.778 m), weight 133.6 kg, SpO2 98 %.  ?   ?Vent Mode: PRVC ?FiO2 (%):  [40 %] 40 % ?Set Rate:  [22 bmp] 22 bmp ?Vt Set:  [600 mL] 600 mL ?PEEP:  [5 cmH20] 5 cmH20 ?Pressure Support:  [5 cmH20-10 cmH20] 5 cmH20 ?Plateau Pressure:  [13 cmH20-14 cmH20] 13 cmH20  ? ?Intake/Output Summary (Last 24 hours) at 02/26/2022 0714 ?Last data filed at 02/26/2022 0555 ?Gross per 24 hour  ?Intake 5186.54 ml  ?Output 545 ml  ?Net 4641.54 ml  ? ? ?Filed Weights  ? 02/22/22 1820 02/25/22 0432 02/26/22 0457  ?Weight: 104.3 kg 126.4 kg 133.6 kg  ? ? ?Examination: ?General: Chronically ill-appearing man lying in bed no acute distress ?HEENT: [place]/AT, eyes anicteric, endotracheal tube in place ?Neuro: RASS -4, only response during exam is wincing during scrotal exam.  Breathing above the vent. ?CV: S1-S2, regular rate, irregular rhythm ?PULM: Faint rhonchi, minimal endotracheal secretions.  Adequate tidal volumes on pressure support 8 ?GI: Soft, nontender ?Extremities: Peripheral edema in all extremities, ongoing severe mottling of feet  ?GU: Significant scrotal edema and discoloration sparing only the most lateral sides.  Similar to previous exams.  Skin tears with some oozing. ?skin: No diffuse rashes.  Scrotal skin breakdown and foot mottling as above. ? ?Blood culture> NGTD ? ?Potassium 3.9 ?Bicarb 22 ?BUN 98 ?Creatinine 6.65 ?Calcium 6.2 ?Phos 5.4 ? ?WBC 15.8 ?H/H  9.4/27.9 ?Platelets 43 ? ? ?  Assessment & Plan:  ?Septic shock, no clear etiology at this time--- the most significant source still appears to be his scrotum. UA with high WBC and infected appearing scrotum raise concern for urinary source, but urine cultures were unhelpful.  Has BLE TKA, but no recent pain or issues with it.  No cholecystitis. ?Lactic acidosis due to sepsis ?Abdominal pain, n/v/d likely  related to sepsis  ?-Continue broad antibiotics-Zosyn and linezolid ?-vasopressors to maintain MAP>65; down to vaso & NE ?-con't methylene blue ?-con't stress dose steroids ?-appreciate Urology's input ? ?New onset A. Fib, with RVR, suspect secondary to sepsis, TSH 0.68, no history of A-fib ?Troponin & BNP elevation> suspect related to sepsis ?-Con't heparin, monitor closely for bleeding with thrombocytopenia ?-rate improved off epinephrine ? ?Acute hypoxic respiratory failure requiring mechanical ventilation ?- LTVV, 4-8cc/kg IBW with goal Pplat<30 and DP<15 ?-VAP prevention protocol ?- PAD protocol for sedation, goal RASS 0 to -1 ? ?AKI, suspect secondary to sepsis. No hydronephrosis on imaging. ?hyperphosphatemia ?-Renally dose medications and avoid nephrotoxic meds ?-Continue bicarb infusion ?- Anticipate starting renal replacement therapy today ?- Strict I's/O ?- Appreciate nephrology's management ?-Continue Foley ? ?Elevated CK-- PRIS? ?-change propofol to fentanyl ? ?Elevated transaminases and hyperbilirubinemia; likely due to sepsis-continuing to worsen.  No cholecystitis on imaging. ?-Continue to monitor ? ?Hypocalcemia ?-Repleted ?- Continue to monitor ? ?Acute metabolic encephalopathy due to sepsis ?-PAD protocol, need to lighten sedation.  Using ketamine preferentially to help with hemodynamics. ? ?Hyperglycemia, controlled ?-Sliding scale insulin as needed ?- Goal blood glucose 140-180 ? ?Leukopenia, secondary to sepsis> now leukocytosis ?thrombocytopenia, likely due to sepsis versus antibiotics. 4T score = 3, low probability ?-repeat DIC panel ?-cautiously con't heparin gtt with such significant LE arterial disease and significant risk of ischemic injury to his feet after his critical illness  ? ?Acute electrolyte abnormality- hypokalemia, hypomagnesia, hypocalcemia ?-Continue to monitor electrolytes ? ?History of gastric ulcer ?-Continue PPI ? ?Splenomegaly seen on CT, chronic since 2015 ?-OP follow  up ? ?At risk for malnutrition ?-Continue trickle tube feeds, would like to advance as pressors decrease. ? ?Wife will be updated at bedside today. ? ?Best Practice (right click and "Reselect all SmartList Selections" daily)  ? ?Diet/type: tubefeeds ?DVT prophylaxis: systemic heparin ?GI prophylaxis: PPI ?Lines: Central line, Arterial Line, and yes and it is still needed ?Foley:  Yes, and it is still needed ?Code Status:  full code ?Last date of multidisciplinary goals of care discussion [4/2 Discussed with wife Jeffrey Ayala at bedside.  Full scope of care.  Once CPR and intubation if needed] ? ?Labs   ?CBC: ?Recent Labs  ?Lab 02/22/22 ?1805 02/22/22 ?1819 02/23/22 ?3220 02/23/22 ?2542 02/24/22 ?7062 02/24/22 ?3762 02/25/22 ?8315 02/26/22 ?0427  ?WBC 2.3*  --  17.6*  --  19.7*  --  21.9* 15.8*  ?NEUTROABS 2.2  --   --   --   --   --   --   --   ?HGB 13.1   < > 12.5* 13.6 11.4*  --  10.0* 9.4*  ?HCT 40.6   < > 38.8* 40.0 33.1*  --  30.6* 27.9*  ?MCV 92.5  --  93.9  --  88.7  --  91.6 89.1  ?PLT 193  --  163  --  82* 77* 63* 43*  ? < > = values in this interval not displayed.  ? ? ? ?Basic Metabolic Panel: ?Recent Labs  ?Lab 02/23/22 ?1761 02/23/22 ?6073 02/23/22 ?1208 02/23/22 ?1846 02/24/22 ?7106 02/24/22 ?1601  02/25/22 ?1610 02/25/22 ?9604 02/26/22 ?5409  ?NA 137   < > 138 138 140 140  --  137 134*  ?K 2.9*   < > 3.2* 3.7 4.0 4.2  --  3.8 3.9  ?CL 104  --  104 104 103 101  --  99 92*  ?CO2 15*  --  15* 19* 19* 19*  --  21* 22  ?GLUCOSE 191*  --  245* 173* 117* 109*  --  168* 160*  ?BUN 21*  --  29* 36* 45* 60*  --  79* 98*  ?CREATININE 2.61*  --  3.46* 3.43* 4.08* 4.84*  --  5.67* 6.65*  ?CALCIUM 7.2*  --  7.4* 7.2* 6.7* 6.6*  --  6.2* 6.2*  ?MG 1.7  --  2.1  --  1.8  --  2.0  --  2.2  ?PHOS 7.0*  --   --   --  4.9*  --  4.8*  --  5.4*  ? < > = values in this interval not displayed.  ? ? ?GFR: ?Estimated Creatinine Clearance: 16.2 mL/min (A) (by C-G formula based on SCr of 6.65 mg/dL (H)). ?Recent Labs  ?Lab  02/23/22 ?8119 02/23/22 ?1200 02/23/22 ?1846 02/24/22 ?1478 02/24/22 ?2956 02/24/22 ?1601 02/25/22 ?2130 02/26/22 ?8657  ?WBC 17.6*  --   --   --  19.7*  --  21.9* 15.8*  ?LATICACIDVEN >9.0* 7.7* 3.9* 3.6*

## 2022-02-26 NOTE — Progress Notes (Signed)
ANTICOAGULATION CONSULT NOTE ?Pharmacy Consult for heparin ?Indication: atrial fibrillation ?Brief A/P: Heparin level subtherapeutic Increase Heparin rate ? ?Allergies  ?Allergen Reactions  ? Codeine Hives  ?  Insomnia, jittery  ? Flomax [Tamsulosin Hcl] Nausea Only  ?  Nausea and fatigue  ? ? ?Patient Measurements: ?Height: '5\' 10"'$  (177.8 cm) ?Weight: 133.6 kg (294 lb 8.6 oz) ?IBW/kg (Calculated) : 73 ?Heparin Dosing Weight: 95 kg ? ?Vital Signs: ?Temp: 99.5 ?F (37.5 ?C) (04/06 9983) ?Temp Source: Bladder (04/06 3825) ?Pulse Rate: 109 (04/06 0334) ? ?Labs: ?Recent Labs  ?  02/23/22 ?1208 02/23/22 ?1846 02/24/22 ?0539 02/24/22 ?7673 02/24/22 ?4193 02/24/22 ?7902 02/24/22 ?1244 02/24/22 ?1601 02/25/22 ?0434 02/25/22 ?0645 02/25/22 ?1659 02/25/22 ?1748 02/26/22 ?4097  ?HGB  --   --   --  11.4*   < >  --   --   --  10.0*  --   --   --  9.4*  ?HCT  --   --   --  33.1*  --   --   --   --  30.6*  --   --   --  27.9*  ?PLT  --   --   --  82*   < > 77*  --   --  63*  --   --   --  43*  ?APTT  --   --   --   --   --  >200*  --   --   --   --   --   --   --   ?LABPROT  --   --   --   --   --  24.3*  --   --   --   --   --   --   --   ?INR  --   --   --   --   --  2.2*  --   --   --   --   --   --   --   ?HEPARINUNFRC 0.18*   < > 0.30  --   --   --    < >  --  0.11*  --   --  0.19* 0.20*  ?CREATININE 3.46*   < > 4.08*  --   --   --   --  4.84*  --  5.67*  --   --   --   ?CKTOTAL  --   --   --   --   --   --   --   --   --   --  978*  --   --   ?TROPONINIHS 3,550*  --   --   --   --   --   --   --   --   --   --   --   --   ? < > = values in this interval not displayed.  ? ? ? ?Estimated Creatinine Clearance: 19 mL/min (A) (by C-G formula based on SCr of 5.67 mg/dL (H)). ? ? ?Assessment:  ?61 y.o. male with Afib for heparin ? ?Goal of Therapy:  ?Heparin level 0.3-0.7 units/ml ?Monitor platelets by anticoagulation protocol: Yes ?  ?Plan:  ?Increase Heparin 2650 units/hr ?Check heparin level in 8 hours. ? ?Phillis Knack, PharmD,  BCPS  ?02/26/2022, 5:43 AM ? ?

## 2022-02-26 NOTE — Progress Notes (Signed)
Patient ID: Jeffrey Ayala, male   DOB: [birth year], 61 y.o.   MRN: [id number] ? ?  ?Subjective: ?Pt with decreased pressor support.  Now off epinephrine and norepinephrine and vasopression drips titrated down. ? ?Objective: ?Vital signs in last 24 hours: ?Temp:  [97.8 ?F (36.6 ?C)-101.5 ?F (38.6 ?C)] 97.8 ?F (36.6 ?C) (04/06 1245) ?Pulse Rate:  [91-141] 99 (04/06 0630) ?Resp:  [14-26] 23 (04/06 0630) ?SpO2:  [92 %-100 %] 98 % (04/06 0630) ?Arterial Line BP: (91-150)/(47-80) 116/67 (04/06 0630) ?FiO2 (%):  [40 %] 40 % (04/06 0334) ?Weight:  [133.6 kg] 133.6 kg (04/06 0457) ? ?Intake/Output from previous day: ?04/05 0701 - 04/06 0700 ?In: 6171.8 [I.V.:4676.2; NG/GT:315; IV Piggyback:1180.6] ?Out: 545 [Urine:545] ?Intake/Output this shift: ?No intake/output data recorded. ? ?Physical Exam:  ?General: Sedated ?GU: There is more scrotal edema compared to last 24 hrs but this is consistent with overall generalized edema associated with resuscitation efforts. Area of ecchymosis remains relatively stable and still well demarcated over anterior scrotum.  There is very superficial skin breakdown consistent with edema but there is still no crepitus, necrosis, fluctuance, induration, or drainage to suggest scrotal infection. ? ?Lab Results: ?Recent Labs  ?  02/24/22 ?0605 02/25/22 ?0434 02/26/22 ?0427  ?HGB 11.4* 10.0* 9.4*  ?HCT 33.1* 30.6* 27.9*  ? ? ?  Latest Ref Rng & Units 02/26/2022  ?  4:27 AM 02/25/2022  ?  4:34 AM 02/24/2022  ?  9:32 AM  ?CBC  ?WBC 4.0 - 10.5 K/uL 15.8   21.9     ?Hemoglobin 13.0 - 17.0 g/dL 9.4   10.0     ?Hematocrit 39.0 - 52.0 % 27.9   30.6     ?Platelets 150 - 400 K/uL 43   63   77    ? ? ? ?BMET ?Recent Labs  ?  02/25/22 ?0645 02/26/22 ?0427  ?NA 137 134*  ?K 3.8 3.9  ?CL 99 92*  ?CO2 21* 22  ?GLUCOSE 168* 160*  ?BUN 79* 98*  ?CREATININE 5.67* 6.65*  ?CALCIUM 6.2* 6.2*  ? ? ? ?Studies/Results: ? ? ?Assessment/Plan: ?1) Scrotal edema/ecchymosis: Exam findings are basically unchanged.  Although there is  more edema, this is consistent with generalized edema and does not suggest an isolated scrotal problem.  He is showing some signs of clinical improvement with decreased pressor support and improved leukocytosis today.  I still do not have a high suspicious that he has a scrotal infection as his exam and prior imaging studies are not consistent with infection.  Will continue to follow closely though.  If there remains doubt about any obvious source, could repeat imaging this weekend and/or consider surgical exploration if exams becomes more worrisome/patient continues to stabilize. ? ? LOS: 4 days  ? ?Jeffrey Ayala ?02/26/2022, 7:26 AM ? ?  ?

## 2022-02-26 NOTE — Progress Notes (Addendum)
Nutrition Follow-up ? ?DOCUMENTATION CODES:  ? ?Not applicable ? ?INTERVENTION:  ? ?Advance tube feeds via OG tube: ?- Increase Vital 1.5 by 10 ml q 8 hours to rate of 30 ml/hr (720 ml/day) ?- Add ProSource TF 90 ml TID ? ?Tube feeding regimen provides 1320 kcal, 115 grams of protein, and 550 ml of H2O. ? ? ?RD will continue to monitor for ability to advance tube feeds to goal regimen: ?- Vital 1.5 @ 60 ml/hr (1440 ml/day) ?- ProSource TF 90 ml TID ?  ?Recommended tube feeding regimen at goal rate would provide 2400 kcal, 163 grams of protein, and 1100 ml of H2O. ? ?- Add B-complex with vitamin D daily per tube, vitamin C 250 mg BID per tube, and folic acid 1 mg daily per tube to account for losses with CRRT ? ?NUTRITION DIAGNOSIS:  ? ?Inadequate oral intake related to inability to eat as evidenced by NPO status. ? ?Ongoing, being addressed via TF ? ?GOAL:  ? ?Patient will meet greater than or equal to 90% of their needs ? ?Progressing with slow advancement of tube feeds ? ?MONITOR:  ? ?Vent status, Labs, Weight trends, TF tolerance, Skin, I & O's ? ?REASON FOR ASSESSMENT:  ? ?Ventilator, Consult ?Enteral/tube feeding initiation and management (trickle tube feeds at 10 ml/hr per MD) ? ?ASSESSMENT:  ? ?61 year old male who presented to the ED on 4/02 with N/V/D. PMH of HTN, PUD, eosinophilic esophagitis, kidney stones. Pt admitted with septic shock, new onset atrial fibrillation, AKI. ? ?04/03 - intubated, brief cardiac arrest ?04/06 - CRRT initiated ? ?Discussed pt with RN and during ICU rounds. Pt starting CRRT today due to AKI. Pt now only on 2 pressors and pressor requirements have decreased. Per CCM, okay to increase trickle tube feeds slightly today from 10 ml/hr to 30 ml/hr. Pt's goal rate remains 60 ml/hr. RD will continue to monitor for ability to advance tube feeds to goal. ? ?Reached out to MD regarding ordering B-complex with vitamin C, vitamin C, and folic acid to account for losses with CRRT. MD in  agreement. Orders placed. ? ?Admit weight: 104.3 kg ?Current weight: 133.6 kg ? ?Pt with moderate pitting generalized edema and non-pitting edema to perineal region. ? ?Patient is currently intubated on ventilator support ?MV: 10.3 L/min ?Temp (24hrs), Avg:99.6 ?F (37.6 ?C), Min:97.8 ?F (36.6 ?C), Max:101.1 ?F (38.4 ?C) ?BP (a-line): 108/60 ?MAP (a-line): 74 ? ?Drips: ?Propofol: off this AM ?Fentanyl ?Levophed: 17.8 ml/hr ?Vasopressin: 9 ml/hr ?Sodium bicarb: 100 ml/hr ?Ketamine ?Heparin ? ?Medications reviewed and include: IV solu-cortef, SSI q 4 hours, IV protonix, IV lasix, IV abx ? ?Labs reviewed: sodium 134, BUN 98, creatinine 6.65, phosphorus 5.4, elevated LFTs, WBC 15.8, hemoglobin 9.4, platelets 40 ?CBG's: 141-198 x 24 hours ? ?UOP: 545 ml x 24 hours ?I/O's: +21.5 L since admit ? ?Diet Order:   ?Diet Order   ? ?       ?  Diet NPO time specified  Diet effective now       ?  ? ?  ?  ? ?  ? ? ?EDUCATION NEEDS:  ? ?No education needs have been identified at this time ? ?Skin:  Skin Assessment: ?Skin Integrity Issues: ?Stage II: coccyx ? ?Last BM:  02/26/22 large type 7 ? ?Height:  ? ?Ht Readings from Last 1 Encounters:  ?02/22/22 '5\' 10"'$  (1.778 m)  ? ? ?Weight:  ? ?Wt Readings from Last 1 Encounters:  ?02/26/22 133.6 kg  ? ? ?Ideal Body Weight:  75.5 kg ? ?BMI:  Body mass index is 42.26 kg/m?. ? ?Estimated Nutritional Needs:  ? ?Kcal:  2300-2500 ? ?Protein:  150-170 grams ? ?Fluid:  >/= 2.0 L ? ? ? ?Jeffrey Bryant, MS, RD, LDN ?Inpatient Clinical Dietitian ?Please see AMiON for contact information. ? ?

## 2022-02-26 NOTE — Progress Notes (Addendum)
ANTICOAGULATION CONSULT NOTE ? ?Pharmacy Consult for heparin ?Indication: atrial fibrillation ? ?Allergies  ?Allergen Reactions  ? Codeine Hives  ?  Insomnia, jittery  ? Flomax [Tamsulosin Hcl] Nausea Only  ?  Nausea and fatigue  ? ? ?Patient Measurements: ?Height: '5\' 10"'$  (177.8 cm) ?Weight: 133.6 kg (294 lb 8.6 oz) ?IBW/kg (Calculated) : 73 ?Heparin Dosing Weight: 95 kg ? ?Vital Signs: ?Temp: 97.8 ?F (36.6 ?C) (04/06 9528) ?Temp Source: Bladder (04/06 4132) ?Pulse Rate: 99 (04/06 0630) ? ?Labs: ?Recent Labs  ?  02/23/22 ?1208 02/23/22 ?1846 02/24/22 ?4401 02/24/22 ?0272 02/24/22 ?1244 02/24/22 ?1601 02/25/22 ?5366 02/25/22 ?4403 02/25/22 ?1659 02/25/22 ?1748 02/26/22 ?4742 02/26/22 ?5956  ?HGB  --    < > 11.4*  --   --   --  10.0*  --   --   --  9.4*  --   ?HCT  --   --  33.1*  --   --   --  30.6*  --   --   --  27.9*  --   ?PLT  --    < > 82* 77*  --   --  63*  --   --   --  43* 40*  ?APTT  --   --   --  >200*  --   --   --   --   --   --   --  119*  ?LABPROT  --   --   --  24.3*  --   --   --   --   --   --   --  15.8*  ?INR  --   --   --  2.2*  --   --   --   --   --   --   --  1.3*  ?HEPARINUNFRC 0.18*   < >  --   --    < >  --  0.11*  --   --  0.19* 0.20*  --   ?CREATININE 3.46*   < >  --   --   --  4.84*  --  5.67*  --   --  6.65*  --   ?CKTOTAL  --   --   --   --   --   --   --   --  978*  --   --   --   ?TROPONINIHS 3,550*  --   --   --   --   --   --   --   --   --   --   --   ? < > = values in this interval not displayed.  ? ? ? ?Estimated Creatinine Clearance: 16.2 mL/min (A) (by C-G formula based on SCr of 6.65 mg/dL (H)). ? ? ?Assessment:  ?61 y.o. male with new onset Afib RVR, likely secondary to sepsis.  Urology seeing for hematuria.  DIC panel on 4/4 showed elevated aPTT at >200 s and elevated INR at 2.2. Stat heparin level ordered afterwards  and resulted subtherapeutic (0.24). Discussed with CCM and this is likely due to sepsis. ? ?This AM, heparin level continued to be subtherapeutic at 0.2 and  rate was subsequently increased to 2650 units/h. AT3 ordered and 53, showing AT3 deficiency. Repeat DIC panel drawn and negative per CCM. Hgb remains stable this AM, platelets continue to decrease and low this AM at 43. Patient to get catheter to start CRRT later today. Heparin has been turned off in preparation  for procedure. Discussed with CCM and will transition to bivalirudin for anticoagulation post-procedure after aPTT re-check. ?  ?Plan:  ?Stop heparin IV gtt now ?Repeat aPTT after catheter inserted ?Will start bivalirudin IV pending aPTT result ? ?Thank you for involving pharmacy in this patient's care. ? ?Elita Quick, PharmD ?PGY1 Ambulatory Care Pharmacy Resident ?02/26/2022 11:25 AM ? ?**Pharmacist phone directory can be found on [email or web address] listed under [place]** ? ? ?ADDENDUM: ?61 YOM with new onset Afib RVR, likely secondary to sepsis. Platelets this AM 43. Pharmacy consulted to start IV bivalirudin.  ? ?Plan: ?Start bivalirudin IV 0.05 mg/kg/h (use total body weight) ?aPTT every 4 hours ?Goal aPTT 50-85 seconds (targeting lower end of goal due to concurrent thrombocytopenia) ?Daily CBC ?Monitor for signs and symptoms of bleeding ? ?

## 2022-02-26 NOTE — Progress Notes (Deleted)
Pt placed back on full vent support due to desat. Increased Min ventilation, and tachypnea. Pt tolerating well,  RN aware, RT will continue to monitor. ?

## 2022-02-27 DIAGNOSIS — A419 Sepsis, unspecified organism: Secondary | ICD-10-CM | POA: Diagnosis not present

## 2022-02-27 DIAGNOSIS — R197 Diarrhea, unspecified: Secondary | ICD-10-CM

## 2022-02-27 DIAGNOSIS — I4891 Unspecified atrial fibrillation: Secondary | ICD-10-CM | POA: Diagnosis not present

## 2022-02-27 DIAGNOSIS — R6521 Severe sepsis with septic shock: Secondary | ICD-10-CM | POA: Diagnosis not present

## 2022-02-27 LAB — BASIC METABOLIC PANEL
Anion gap: 13 (ref 5–15)
BUN: 87 mg/dL — ABNORMAL HIGH (ref 6–20)
CO2: 24 mmol/L (ref 22–32)
Calcium: 7 mg/dL — ABNORMAL LOW (ref 8.9–10.3)
Chloride: 97 mmol/L — ABNORMAL LOW (ref 98–111)
Creatinine, Ser: 5.1 mg/dL — ABNORMAL HIGH (ref 0.61–1.24)
GFR, Estimated: 12 mL/min — ABNORMAL LOW (ref >60)
Glucose, Bld: 158 mg/dL — ABNORMAL HIGH (ref 70–99)
Potassium: 4.7 mmol/L (ref 3.5–5.1)
Sodium: 134 mmol/L — ABNORMAL LOW (ref 135–145)

## 2022-02-27 LAB — HEPATIC FUNCTION PANEL
ALT: 62 U/L — ABNORMAL HIGH (ref 0–44)
AST: 97 U/L — ABNORMAL HIGH (ref 15–41)
Albumin: 2.1 g/dL — ABNORMAL LOW (ref 3.5–5.0)
Alkaline Phosphatase: 202 U/L — ABNORMAL HIGH (ref 38–126)
Bilirubin, Direct: 11.4 mg/dL — ABNORMAL HIGH (ref 0.0–0.2)
Indirect Bilirubin: 4 mg/dL — ABNORMAL HIGH (ref 0.3–0.9)
Total Bilirubin: 15.4 mg/dL — ABNORMAL HIGH (ref 0.3–1.2)
Total Protein: 5.1 g/dL — ABNORMAL LOW (ref 6.5–8.1)

## 2022-02-27 LAB — CBC
HCT: 27.5 % — ABNORMAL LOW (ref 39.0–52.0)
Hemoglobin: 8.9 g/dL — ABNORMAL LOW (ref 13.0–17.0)
MCH: 29.4 pg (ref 26.0–34.0)
MCHC: 32.4 g/dL (ref 30.0–36.0)
MCV: 90.8 fL (ref 80.0–100.0)
Platelets: 47 10*3/uL — ABNORMAL LOW (ref 150–400)
RBC: 3.03 MIL/uL — ABNORMAL LOW (ref 4.22–5.81)
RDW: 16.5 % — ABNORMAL HIGH (ref 11.5–15.5)
WBC: 10.1 10*3/uL (ref 4.0–10.5)
nRBC: 0.6 % — ABNORMAL HIGH (ref 0.0–0.2)

## 2022-02-27 LAB — BPAM PLATELET PHERESIS
Blood Product Expiration Date: 202304082359
ISSUE DATE / TIME: 202304061235
Unit Type and Rh: 6200

## 2022-02-27 LAB — PREPARE PLATELET PHERESIS: Unit division: 0

## 2022-02-27 LAB — RENAL FUNCTION PANEL
Albumin: 2 g/dL — ABNORMAL LOW (ref 3.5–5.0)
Anion gap: 13 (ref 5–15)
BUN: 74 mg/dL — ABNORMAL HIGH (ref 6–20)
CO2: 24 mmol/L (ref 22–32)
Calcium: 7.3 mg/dL — ABNORMAL LOW (ref 8.9–10.3)
Chloride: 97 mmol/L — ABNORMAL LOW (ref 98–111)
Creatinine, Ser: 3.91 mg/dL — ABNORMAL HIGH (ref 0.61–1.24)
GFR, Estimated: 17 mL/min — ABNORMAL LOW (ref >60)
Glucose, Bld: 148 mg/dL — ABNORMAL HIGH (ref 70–99)
Phosphorus: 5.9 mg/dL — ABNORMAL HIGH (ref 2.5–4.6)
Potassium: 5.3 mmol/L — ABNORMAL HIGH (ref 3.5–5.1)
Sodium: 134 mmol/L — ABNORMAL LOW (ref 135–145)

## 2022-02-27 LAB — GLUCOSE, CAPILLARY
Glucose-Capillary: 129 mg/dL — ABNORMAL HIGH (ref 70–99)
Glucose-Capillary: 142 mg/dL — ABNORMAL HIGH (ref 70–99)
Glucose-Capillary: 144 mg/dL — ABNORMAL HIGH (ref 70–99)
Glucose-Capillary: 145 mg/dL — ABNORMAL HIGH (ref 70–99)
Glucose-Capillary: 156 mg/dL — ABNORMAL HIGH (ref 70–99)
Glucose-Capillary: 159 mg/dL — ABNORMAL HIGH (ref 70–99)

## 2022-02-27 LAB — CULTURE, BLOOD (ROUTINE X 2)
Culture: NO GROWTH
Culture: NO GROWTH
Special Requests: ADEQUATE
Special Requests: ADEQUATE

## 2022-02-27 LAB — APTT
aPTT: 54 seconds — ABNORMAL HIGH (ref 24–36)
aPTT: 59 seconds — ABNORMAL HIGH (ref 24–36)
aPTT: 65 seconds — ABNORMAL HIGH (ref 24–36)

## 2022-02-27 LAB — PHOSPHORUS: Phosphorus: 5.7 mg/dL — ABNORMAL HIGH (ref 2.5–4.6)

## 2022-02-27 LAB — MAGNESIUM: Magnesium: 2.4 mg/dL (ref 1.7–2.4)

## 2022-02-27 MED ORDER — VITAL 1.5 CAL PO LIQD
1000.0000 mL | ORAL | Status: DC
  Administered 2022-02-27 – 2022-03-01 (×3): 1000 mL
  Filled 2022-02-27 (×2): qty 1000

## 2022-02-27 NOTE — Progress Notes (Signed)
Pt placed in PSV by RT, pt tolerating well at this time. RN aware, RT will continue to monitor.  ?

## 2022-02-27 NOTE — Progress Notes (Signed)
Brief Nutrition Note ? ?Discussed pt with RN and during ICU rounds. Pt currently receiving tube feeds at 30 ml/hr via OG tube with ProSource TF 90 ml TID and tolerating well. Pt's abdomen is slightly distended but pt is having bowel movements and pressor requirements have decreased. Discussed pt with CCM MD who would like to slowly advance tube feeds to goal rate of 60 ml/hr. Discussed with RN. ? ?Will adjust tube feeding orders: ?- Advance Vital 1.5 by 10 ml q 12 hours to goal rate of 60 ml/hr (1440 ml/day) ?- Continue ProSource TF 90 ml TID ? ?Tube feeding regimen at goal rate provides 2400 kcal, 163 grams of protein, and 1100 ml of H2O.  ? ?RD will continue to follow pt during admission. ? ? ?Gustavus Bryant, MS, RD, LDN ?Inpatient Clinical Dietitian ?Please see AMiON for contact information. ? ? ?

## 2022-02-27 NOTE — Progress Notes (Signed)
?[facility] ?Progress Note  ? ? ?Assessment/ Plan:   ?AKI: in the setting of severe septic shock- oliguric now- no hard indications for starting RRT today but will need in next 24 hrs ?            - start CRRT today 02/26/22 ? - 4K bath, no heparin- plts 43, systemic hep gtt changed to bivalirudin ? - has received ca gluconate today- close attention to Ca repletion ?            - tolerating even- increase uF goal to 50-100 mL/ hr ?            - sending CK in setting of hypocalcemia- 900s ? - stop bicarb gtt once on CRRT ?  ?2.  Septic shock ?            - severe ?            - on pressors, stress dose steroids, IV antibiotics (linezolid and zosyn) methylene blue ?            - likely source is scrotum ?  ?3.  Scrotal infection ?            - urology following, reimaging possibly ?  ?4.  Acute hypoxic RF ?            - intubated per PCCM ? ?5.  Thrombocytopenia/ coagulopathy: abd [place] with cirrhosis, elevated Tbili no schistocytes on smear, likely sepsis related (? Shock liver) ? ?6.  Hypocalcemia: aggressive repletion ? ?7.  Afib with RVR- hep--> bivalirudin gtt ?  ?8. Dispo: ICU ? ?Subjective:   ? ?CRRT started yesterday, tolerating well.  Pressor requirement is coming down.  ? ?Objective:   ?BP 132/74   Pulse 89   Temp 97.6 ?F (36.4 ?C) (Bladder)   Resp (!) 9   Ht '5\' 10"'  (1.778 m)   Wt 135 kg   SpO2 96%   BMI 42.70 kg/m?  ? ?Intake/Output Summary (Last 24 hours) at 02/27/2022 1149 ?Last data filed at 02/27/2022 1100 ?Gross per 24 hour  ?Intake 4015.82 ml  ?Output 2932 ml  ?Net 1083.82 ml  ? ?Weight change: 1.4 kg ? ?Physical Exam: ?GEN: ill-appearing, intermittently biting on tube ?HEENT eyes closed ?PULM coarse bilaterally, intubated ?CV tachycardic ?ABD distended, still soft, hypoactive bowel sounds ?GU: swollen scrotum with deeply violaceous discoloration with some weeping ?EXT 2 + anasarca, mottled and cool bilateral soles of feet and toes, + jaundiced ?NEURO intubated and  sedated ? ?Imaging: ?DG CHEST PORT 1 VIEW ? ?Result Date: 02/26/2022 ?CLINICAL DATA:  Central line placement EXAM: PORTABLE CHEST 1 VIEW COMPARISON:  02/23/2022 FINDINGS: Interval placement of large-bore multi lumen left neck vascular catheter, tip near the confluence of the superior vena cava. Otherwise unchanged support apparatus and AP portable chest radiograph, including endotracheal tube, esophagogastric tube, and right neck vascular catheter. Cardiomegaly with diffuse bilateral interstitial opacity. No new airspace opacity. IMPRESSION: 1. Interval placement of large-bore multi lumen left neck vascular catheter, tip near the confluence of the superior vena cava. 2. Otherwise unchanged support apparatus and AP portable chest radiograph. Electronically Signed   By: Delanna Ahmadi M.D.   On: 02/26/2022 15:06   ? ?Labs: ?BMET ?Recent Labs  ?Lab 02/23/22 ?5176 02/23/22 ?1607 02/23/22 ?1846 02/24/22 ?3710 02/24/22 ?1601 02/25/22 ?0434 02/25/22 ?0645 02/26/22 ?6269 02/26/22 ?1600 02/27/22 ?4854  ?NA 137   < > 138 140 140  --  137 134* 133* 134*  ?K 2.9*   < >  3.7 4.0 4.2  --  3.8 3.9 5.5* 4.7  ?CL 104   < > 104 103 101  --  99 92* 91* 97*  ?CO2 15*   < > 19* 19* 19*  --  21* '22 23 24  ' ?GLUCOSE 191*   < > 173* 117* 109*  --  168* 160* 166* 158*  ?BUN 21*   < > 36* 45* 60*  --  79* 98* 110* 87*  ?CREATININE 2.61*   < > 3.43* 4.08* 4.84*  --  5.67* 6.65* 6.87* 5.10*  ?CALCIUM 7.2*   < > 7.2* 6.7* 6.6*  --  6.2* 6.2* 6.5* 7.0*  ?PHOS 7.0*  --   --  4.9*  --  4.8*  --  5.4* 7.1* 5.7*  ? < > = values in this interval not displayed.  ? ?CBC ?Recent Labs  ?Lab 02/22/22 ?1805 02/22/22 ?1819 02/24/22 ?4097 02/24/22 ?3532 02/25/22 ?9924 02/26/22 ?2683 02/26/22 ?4196 02/27/22 ?2229  ?WBC 2.3*   < > 19.7*  --  21.9* 15.8*  --  10.1  ?NEUTROABS 2.2  --   --   --   --   --   --   --   ?HGB 13.1   < > 11.4*  --  10.0* 9.4*  --  8.9*  ?HCT 40.6   < > 33.1*  --  30.6* 27.9*  --  27.5*  ?MCV 92.5   < > 88.7  --  91.6 89.1  --  90.8  ?PLT  193   < > 82*   < > 63* 43* 40* 47*  ? < > = values in this interval not displayed.  ? ? ?Medications:   ? ? vitamin C  250 mg Per Tube BID  ? B-complex with vitamin C  1 tablet Per Tube Daily  ? chlorhexidine gluconate (MEDLINE KIT)  15 mL Mouth Rinse BID  ? Chlorhexidine Gluconate Cloth  6 each Topical Q0600  ? feeding supplement (PROSource TF)  90 mL Per Tube TID  ? fludrocortisone  0.1 mg Per Tube Daily  ? folic acid  1 mg Per Tube Daily  ? hydrocortisone sod succinate (SOLU-CORTEF) inj  100 mg Intravenous Q12H  ? insulin aspart  0-15 Units Subcutaneous Q4H  ? mouth rinse  15 mL Mouth Rinse 10 times per day  ? methylene blue (PROVAY) 250 mg / 250 mL infusion  0.5 mg/kg/hr Intravenous Q4H  ? pantoprazole (PROTONIX) IV  40 mg Intravenous QHS  ? sodium chloride flush  10-40 mL Intracatheter Q12H  ? ? ? ? ?Madelon Lips MD ?02/27/2022, 11:49 AM   ?

## 2022-02-27 NOTE — Progress Notes (Signed)
ANTICOAGULATION CONSULT NOTE ? ?Pharmacy Consult for heparin > bivalirudin ?Indication: atrial fibrillation ? ?Allergies  ?Allergen Reactions  ? Codeine Hives  ?  Insomnia, jittery  ? Flomax [Tamsulosin Hcl] Nausea Only  ?  Nausea and fatigue  ? ? ?Patient Measurements: ?Height: '5\' 10"'$  (177.8 cm) ?Weight: 135 kg (297 lb 9.9 oz) ?IBW/kg (Calculated) : 73 ?Heparin Dosing Weight: 95 kg ? ?Vital Signs: ?Temp: 97.8 ?F (36.6 ?C) (04/07 1200) ?Temp Source: Bladder (04/07 1200) ?Pulse Rate: 86 (04/07 1700) ? ?Labs: ?Recent Labs  ?  02/25/22 ?0434 02/25/22 ?0645 02/25/22 ?1659 02/25/22 ?1748 02/26/22 ?0427 02/26/22 ?0851 02/26/22 ?1334 02/26/22 ?1600 02/26/22 ?2025 02/26/22 ?2334 02/27/22 ?0317 02/27/22 ?1503 02/27/22 ?1648  ?HGB 10.0*  --   --   --  9.4*  --   --   --   --   --  8.9*  --   --   ?HCT 30.6*  --   --   --  27.9*  --   --   --   --   --  27.5*  --   --   ?PLT 63*  --   --   --  43* 40*  --   --   --   --  47*  --   --   ?APTT  --   --   --   --   --  119*   < >  --    < > 59* 65*  --  54*  ?LABPROT  --   --   --   --   --  15.8*  --   --   --   --   --   --   --   ?INR  --   --   --   --   --  1.3*  --   --   --   --   --   --   --   ?HEPARINUNFRC 0.11*  --   --  0.19* 0.20*  --   --   --   --   --   --   --   --   ?CREATININE  --    < >  --   --  6.65*  --   --  6.87*  --   --  5.10* 3.91*  --   ?CKTOTAL  --   --  978*  --   --   --   --   --   --   --   --   --   --   ? < > = values in this interval not displayed.  ? ? ? ?Estimated Creatinine Clearance: 27.8 mL/min (A) (by C-G formula based on SCr of 3.91 mg/dL (H)). ? ? ?Assessment:  ?61 y.o. male with new onset Afib RVR, likely secondary to sepsis.  Urology seeing for hematuria.  DIC panel on 4/4 showed elevated aPTT at >200 s and elevated INR at 2.2. Stat heparin level ordered afterwards  and resulted subtherapeutic (0.24). Discussed with CCM and this is likely due to sepsis. Repeat DIC panel 4/6 still negative, AT3 ordered and 36 indicating AT3  deficiency. Patient was transitioned from heparin gtt to bivalirudin gtt after dialysis catheter insertion on 4/6.  ? ?Repeat aPTT this afternoon remains within goal range at 54 seconds.  ? ?Plan: ?Continue bivalirudin IV 0.05 mg/kg/h (use total body weight) ?Recheck aPTT in am ? ?Arrie Senate, PharmD, BCPS, BCCP ?Clinical Pharmacist ?[phone] ?Please check  AMION for all [place] numbers ?02/27/2022 ? ?

## 2022-02-27 NOTE — Plan of Care (Signed)
?  Problem: Respiratory: ?Goal: Ability to maintain adequate ventilation will improve ?Outcome: Progressing ?  ?Problem: Activity: ?Goal: Ability to tolerate increased activity will improve ?Outcome: Progressing ?  ?Problem: Respiratory: ?Goal: Ability to maintain a clear airway and adequate ventilation will improve ?Outcome: Progressing ?  ?Problem: Education: ?Goal: Knowledge of General Education information will improve ?Description: Including pain rating scale, medication(s)/side effects and non-pharmacologic comfort measures ?Outcome: Not Progressing ?  ?Problem: Health Behavior/Discharge Planning: ?Goal: Ability to manage health-related needs will improve ?Outcome: Not Progressing ?  ?Problem: Clinical Measurements: ?Goal: Ability to maintain clinical measurements within normal limits will improve ?Outcome: Not Progressing ?Goal: Will remain free from infection ?Outcome: Not Progressing ?Goal: Diagnostic test results will improve ?Outcome: Not Progressing ?Goal: Respiratory complications will improve ?Outcome: Not Progressing ?Goal: Cardiovascular complication will be avoided ?Outcome: Not Progressing ?  ?Problem: Activity: ?Goal: Risk for activity intolerance will decrease ?Outcome: Not Progressing ?  ?Problem: Nutrition: ?Goal: Adequate nutrition will be maintained ?Outcome: Not Progressing ?  ?Problem: Coping: ?Goal: Level of anxiety will decrease ?Outcome: Not Progressing ?  ?Problem: Elimination: ?Goal: Will not experience complications related to bowel motility ?Outcome: Not Progressing ?Goal: Will not experience complications related to urinary retention ?Outcome: Not Progressing ?  ?Problem: Pain Managment: ?Goal: General experience of comfort will improve ?Outcome: Not Progressing ?  ?Problem: Safety: ?Goal: Ability to remain free from injury will improve ?Outcome: Not Progressing ?  ?Problem: Skin Integrity: ?Goal: Risk for impaired skin integrity will decrease ?Outcome: Not Progressing ?  ?Problem:  Fluid Volume: ?Goal: Hemodynamic stability will improve ?Outcome: Not Progressing ?  ?Problem: Clinical Measurements: ?Goal: Diagnostic test results will improve ?Outcome: Not Progressing ?Goal: Signs and symptoms of infection will decrease ?Outcome: Not Progressing ?  ?Problem: Role Relationship: ?Goal: Method of communication will improve ?Outcome: Not Progressing ?  ?

## 2022-02-27 NOTE — Progress Notes (Signed)
ANTICOAGULATION CONSULT NOTE ? ?Pharmacy Consult for heparin > bivalirudin ?Indication: atrial fibrillation ? ?Allergies  ?Allergen Reactions  ? Codeine Hives  ?  Insomnia, jittery  ? Flomax [Tamsulosin Hcl] Nausea Only  ?  Nausea and fatigue  ? ? ?Patient Measurements: ?Height: '5\' 10"'$  (177.8 cm) ?Weight: 135 kg (297 lb 9.9 oz) ?IBW/kg (Calculated) : 73 ?Heparin Dosing Weight: 95 kg ? ?Vital Signs: ?Temp: 97.6 ?F (36.4 ?C) (04/07 0800) ?Temp Source: Bladder (04/07 0800) ?Pulse Rate: 88 (04/07 0945) ? ?Labs: ?Recent Labs  ?  02/25/22 ?0434 02/25/22 ?0645 02/25/22 ?1659 02/25/22 ?1748 02/26/22 ?0427 02/26/22 ?0851 02/26/22 ?1334 02/26/22 ?1600 02/26/22 ?2025 02/26/22 ?2334 02/27/22 ?0317  ?HGB 10.0*  --   --   --  9.4*  --   --   --   --   --  8.9*  ?HCT 30.6*  --   --   --  27.9*  --   --   --   --   --  27.5*  ?PLT 63*  --   --   --  43* 40*  --   --   --   --  47*  ?APTT  --   --   --   --   --  119*   < >  --  59* 59* 65*  ?LABPROT  --   --   --   --   --  15.8*  --   --   --   --   --   ?INR  --   --   --   --   --  1.3*  --   --   --   --   --   ?HEPARINUNFRC 0.11*  --   --  0.19* 0.20*  --   --   --   --   --   --   ?CREATININE  --    < >  --   --  6.65*  --   --  6.87*  --   --  5.10*  ?CKTOTAL  --   --  978*  --   --   --   --   --   --   --   --   ? < > = values in this interval not displayed.  ? ? ? ?Estimated Creatinine Clearance: 21.3 mL/min (A) (by C-G formula based on SCr of 5.1 mg/dL (H)). ? ? ?Assessment:  ?61 y.o. male with new onset Afib RVR, likely secondary to sepsis.  Urology seeing for hematuria.  DIC panel on 4/4 showed elevated aPTT at >200 s and elevated INR at 2.2. Stat heparin level ordered afterwards  and resulted subtherapeutic (0.24). Discussed with CCM and this is likely due to sepsis. Repeat DIC panel 4/6 still negative, AT3 ordered and 36 indicating AT3 deficiency. Patient was transitioned from heparin gtt to bivalirudin gtt after dialysis catheter insertion on 4/6.  ? ?aPTT this AM  65s and within therapeutic goal. Since baseline aPTT slightly elevetated, discussed with RN and blood looks thinner compared to yesterdays, no clots observed. Hgb continues to be stable, platelets improved to 47 this AM (s/p platelet transfusion 4/6).  ? ?Plan: ?Continue bivalirudin IV 0.05 mg/kg/h (use total body weight) ?aPTT every 12 hours ?Goal aPTT 50-85 seconds (targeting lower end of goal due to concurrent thrombocytopenia) ?Daily CBC ?Monitor for signs and symptoms of bleeding ? ?Thank you for involving pharmacy in this patient's care. ? ?Elita Quick, PharmD ?PGY1  Ambulatory Care Pharmacy Resident ?02/27/2022 10:06 AM ? ?**Pharmacist phone directory can be found on [email or web address] listed under [place]** ?

## 2022-02-27 NOTE — Progress Notes (Signed)
? ?NAME:  Jeffrey Ayala, MRN:  [id number], DOB:  [birth year], LOS: 5 ?ADMISSION DATE:  02/22/2022, CONSULTATION DATE:  4/2 ?REFERRING MD:  Doren Custard- ED CHIEF COMPLAINT: Septic shock ? ?History of Present Illness:  ?Jeffrey Ayala, is a 61 y.o. male, who presented to the [facility] ED with a chief complaint of nausea, vomiting, diarrhea. ? ?They have a pertinent past medical history of gastric ulcer, kidney stones, eosinophilic esophagitis, migraines  ? ?History obtained from Jeffrey Ayala (spouse), on 4/1 patient began complaining of feeling unwell. Wife denies sick contacts, questionable food.  Wife denies taking any meds currently.  Around 2 PM on 4/2 the patient developed nausea, vomiting, and diarrhea while at a birthday party and soiled himself. EMS was called. When EMS arrived he was found to be in A-fib with a rate of 120-160. ? ?At the ED he was found to be febrile with a Tmax of 102.9.  A code sepsis was called in the emergency department, blood cultures were drawn, 2 L of IV fluid were given, vanc and cefepime were ordered.  CT chest negative for acute process.  CT abdomen pelvis with and without contrast notable for swollen scrotum concerning for infection, colon filled with diarrhea.  Urology and cardiology were consulted by the ED.  Despite fluid resuscitation the patient developed hypotension requiring vasopressors. ? ?PCCM was consulted for admission. ? ?Pertinent  Medical History  ?gastric ulcer, kidney stones, eosinophilic esophagitis, migraines  ? ?Significant Hospital Events: ?Including procedures, antibiotic start and stop dates in addition to other pertinent events   ?4/2 Presented to the ED. CT chest negative for acute process.  CT abdomen pelvis with and without contrast notable for swollen scrotum concerning for infection, colon filled with diarrhea.  Urology and cardiology were consulted by the ED. Vanc, cefepime started.  ?4/3 Intubated overnight, brief cardiac arrest. Escalating pressors. ?4/5  started on methylene blue ?4/6 started on CRRT ? ?Interim History / Subjective:  ?Overnight no issues. Pressor requirements coming down slightly. Tolerating CRRT.  ?Tolerating PS trial ?Wife at bedside. Per her he was awakening and tracking her and responding to her this morning.  ? ?Objective   ?Blood pressure 132/74, pulse 89, temperature 97.6 ?F (36.4 ?C), temperature source Bladder, resp. rate (!) 9, height '5\' 10"'$  (1.778 m), weight 135 kg, SpO2 97 %.  ?   ?Vent Mode: PSV;CPAP ?FiO2 (%):  [40 %] 40 % ?Set Rate:  [22 bmp] 22 bmp ?Vt Set:  [600 mL] 600 mL ?PEEP:  [5 cmH20] 5 cmH20 ?Pressure Support:  [3 cmH20] 3 cmH20 ?Plateau Pressure:  [21 cmH20-26 cmH20] 23 cmH20  ? ?Intake/Output Summary (Last 24 hours) at 02/27/2022 1159 ?Last data filed at 02/27/2022 1100 ?Gross per 24 hour  ?Intake 4015.82 ml  ?Output 2932 ml  ?Net 1083.82 ml  ? ?Filed Weights  ? 02/25/22 0432 02/26/22 0457 02/27/22 0414  ?Weight: 126.4 kg 133.6 kg 135 kg  ? ? ?Examination: ?Gen:      Intubated, sedated, acutely ill appearing ?HEENT:  ETT to vent ?Lungs:    sounds of mechanical ventilation auscultated no wheezes or crackles ?CV:         tachycardic, regular ?Abd:      Distended, soft ?Ext:    No edema ?Skin:      Warm and dry; upper extremity edema, scrotal edema with skin break down ?Neuro:   sedated, RASS -2 ? ? ?Blood culture> NGTD ? ?Potassium 3.9 ?Bicarb 22 ?BUN 98 ?Creatinine 6.65 ?Calcium 6.2 ?Phos 5.4 ? ?  WBC 15.8 ?H/H  9.4/27.9 ?Platelets 43 ? ? ?Assessment & Plan:  ?Septic shock, unclear source ?- continue zosyn empiric therapy for culture negative septic shock.  ?-vasopressors to maintain MAP>65; down to vaso & NE. Will time out methylene blue today.  ?-con't stress dose steroids ?- urology recommendations reviewed - felt source unlikely to be secondary to scrotum. Wife reports scrotal swelling only started in hospital.  ? ?New onset A. Fib, with RVR, suspect secondary to sepsis, TSH 0.68, no history of A-fib ?Troponin & BNP elevation>  suspect related to sepsis ?-Con't bival, monitor closely for bleeding with thrombocytopenia ? ?Acute hypoxic respiratory failure requiring mechanical ventilation ?- LTVV, 4-8cc/kg IBW with goal Pplat<30 and DP<15 ?-VAP prevention protocol ?- PAD protocol for sedation, goal RASS 0 to -1 ?- consider extubation over the weekend once net negative fluid balance can be achieved and sedation can be lightened - consider switch to precedex at some poitn.  ? ?AKI, suspect secondary to sepsis. No hydronephrosis on imaging. ?hyperphosphatemia ?-Renally dose medications and avoid nephrotoxic meds ?-Continue bicarb infusion ?- Continue CRRT with volume removal as tolerated ?- Strict I's/O ?- Appreciate nephrology's management ?-Continue Foley ? ?Shock Liver ?-Continue to monitor, CMET ? ?Acute metabolic encephalopathy due to sepsis ?Elevated CK-- PRIS? ?-PAD protocol, need to lighten sedation. ?-continue ketamine, fentanyl, hold propofol ? ?Hyperglycemia, controlled ?-Sliding scale insulin as needed ?- Goal blood glucose 140-180 ? ?Leukopenia, secondary to sepsis> now leukocytosis ?thrombocytopenia, likely due to sepsis versus antibiotics. 4T score = 3, low probability ?-repeat DIC panel ?-cautiously con't heparin gtt with such significant LE arterial disease and significant risk of ischemic injury to his feet after his critical illness  ? ?Acute electrolyte abnormality- hypokalemia, hypomagnesia, hypocalcemia ?-Continue to monitor electrolytes, replace and correct as needed.  ? ?History of gastric ulcer ?-Continue PPI ? ?Splenomegaly seen on CT, chronic since 2015 ?-OP follow up ? ?At risk for malnutrition ?-Continue trickle tube feeds, would like to advance as pressors decrease. ? ?Wife updated at bedside 4/7.  ? ?Best Practice (right click and "Reselect all SmartList Selections" daily)  ? ?Diet/type: tubefeeds ?DVT prophylaxis: on bivalirudin due to unable to achieve therapeutic dosing on heparin and high risk for clotting.   ?GI prophylaxis: PPI ?Lines: Central line, Arterial Line, and yes and it is still needed ?Foley:  Yes, and it is still needed ?Code Status:  full code ?Last date of multidisciplinary goals of care discussion [4/2 Discussed with wife Jeffrey Ayala at bedside.  Full scope of care.  Once CPR and intubation if needed] ? ?Labs   ?CBC: ?Recent Labs  ?Lab 02/22/22 ?1805 02/22/22 ?1819 02/23/22 ?2992 02/23/22 ?4268 02/24/22 ?3419 02/24/22 ?6222 02/25/22 ?9798 02/26/22 ?9211 02/26/22 ?9417 02/27/22 ?4081  ?WBC 2.3*  --  17.6*  --  19.7*  --  21.9* 15.8*  --  10.1  ?NEUTROABS 2.2  --   --   --   --   --   --   --   --   --   ?HGB 13.1   < > 12.5* 13.6 11.4*  --  10.0* 9.4*  --  8.9*  ?HCT 40.6   < > 38.8* 40.0 33.1*  --  30.6* 27.9*  --  27.5*  ?MCV 92.5  --  93.9  --  88.7  --  91.6 89.1  --  90.8  ?PLT 193  --  163  --  82* 77* 63* 43* 40* 47*  ? < > = values in this interval not  displayed.  ? ? ?Basic Metabolic Panel: ?Recent Labs  ?Lab 02/23/22 ?1208 02/23/22 ?1846 02/24/22 ?6720 02/24/22 ?1601 02/25/22 ?0434 02/25/22 ?0645 02/26/22 ?9470 02/26/22 ?1600 02/27/22 ?9628  ?NA 138   < > 140 140  --  137 134* 133* 134*  ?K 3.2*   < > 4.0 4.2  --  3.8 3.9 5.5* 4.7  ?CL 104   < > 103 101  --  99 92* 91* 97*  ?CO2 15*   < > 19* 19*  --  21* '22 23 24  '$ ?GLUCOSE 245*   < > 117* 109*  --  168* 160* 166* 158*  ?BUN 29*   < > 45* 60*  --  79* 98* 110* 87*  ?CREATININE 3.46*   < > 4.08* 4.84*  --  5.67* 6.65* 6.87* 5.10*  ?CALCIUM 7.4*   < > 6.7* 6.6*  --  6.2* 6.2* 6.5* 7.0*  ?MG 2.1  --  1.8  --  2.0  --  2.2  --  2.4  ?PHOS  --   --  4.9*  --  4.8*  --  5.4* 7.1* 5.7*  ? < > = values in this interval not displayed.  ? ?GFR: ?Estimated Creatinine Clearance: 21.3 mL/min (A) (by C-G formula based on SCr of 5.1 mg/dL (H)). ?Recent Labs  ?Lab 02/23/22 ?1200 02/23/22 ?1846 02/24/22 ?3662 02/24/22 ?9476 02/24/22 ?1601 02/25/22 ?5465 02/26/22 ?0354 02/27/22 ?6568  ?WBC  --   --   --  19.7*  --  21.9* 15.8* 10.1  ?LATICACIDVEN 7.7* 3.9* 3.6*   --  3.9*  --   --   --   ? ? ?Liver Function Tests: ?Recent Labs  ?Lab 02/22/22 ?1805 02/24/22 ?1275 02/25/22 ?0434 02/26/22 ?0427 02/26/22 ?1600 02/27/22 ?0317  ?AST 42* 88* 87* 85*  --  97*  ?ALT 33 53*

## 2022-02-27 NOTE — Progress Notes (Signed)
?  Subjective: ?WBCs trending better this AM. Had dialysis yesterday per nephrology.  ? ?Objective: ?Vital signs in last 24 hours: ?Temp:  [96.7 ?F (35.9 ?C)-99.1 ?F (37.3 ?C)] 97.6 ?F (36.4 ?C) (04/07 0800) ?Pulse Rate:  [70-115] 87 (04/07 0730) ?Resp:  [0-25] 15 (04/07 0800) ?BP: (132)/(74) 132/74 (04/06 1245) ?SpO2:  [93 %-100 %] 99 % (04/07 0730) ?Arterial Line BP: (82-348)/(52-342) 117/62 (04/07 0800) ?FiO2 (%):  [40 %] 40 % (04/07 0255) ?Weight:  [135 kg] 135 kg (04/07 0414) ? ?Intake/Output from previous day: ?04/06 0701 - 04/07 0700 ?In: 4592.3 [I.V.:1785.7; Blood:330; NG/GT:690; IV Piggyback:1771.6] ?Out: 2188 [Urine:195] ?Intake/Output this shift: ?Total I/O ?In: 82.5 [I.V.:52.5; NG/GT:30] ?Out: 258 [Urine:10; Other:248] ? ?Physical Exam:  ?General: Alert and oriented ?CV: RRR ?Lungs: Clear ?Abdomen: Soft, ND, ATTP ?Ext: NT, No erythema ?GU: scrotum and penis edematous. There is scrotal edema and ecchymosis with some superficial sloughing, overall seems consistent with descriptions by Dr Junious Silk and Alinda Money  ? ?Lab Results: ?Recent Labs  ?  02/25/22 ?6283 02/26/22 ?0427 02/27/22 ?0317  ?HGB 10.0* 9.4* 8.9*  ?HCT 30.6* 27.9* 27.5*  ? ?BMET ?Recent Labs  ?  02/26/22 ?1600 02/27/22 ?0317  ?NA 133* 134*  ?K 5.5* 4.7  ?CL 91* 97*  ?CO2 23 24  ?GLUCOSE 166* 158*  ?BUN 110* 87*  ?CREATININE 6.87* 5.10*  ?CALCIUM 6.5* 7.0*  ? ? ? ?Studies/Results: ?DG CHEST PORT 1 VIEW ? ?Result Date: 02/26/2022 ?CLINICAL DATA:  Central line placement EXAM: PORTABLE CHEST 1 VIEW COMPARISON:  02/23/2022 FINDINGS: Interval placement of large-bore multi lumen left neck vascular catheter, tip near the confluence of the superior vena cava. Otherwise unchanged support apparatus and AP portable chest radiograph, including endotracheal tube, esophagogastric tube, and right neck vascular catheter. Cardiomegaly with diffuse bilateral interstitial opacity. No new airspace opacity. IMPRESSION: 1. Interval placement of large-bore multi lumen  left neck vascular catheter, tip near the confluence of the superior vena cava. 2. Otherwise unchanged support apparatus and AP portable chest radiograph. Electronically Signed   By: Delanna Ahmadi M.D.   On: 02/26/2022 15:06   ? ?Assessment/Plan: ?Scrotal Edema - no surgical indication at this time. Will continue to follow ? ? LOS: 5 days  ? ?Donald Pore MD ?02/27/2022, 8:37 AM ?[facility]  ?Pager: [phone] ? ? ? ?

## 2022-02-28 ENCOUNTER — Inpatient Hospital Stay (HOSPITAL_COMMUNITY): Payer: BC Managed Care – PPO

## 2022-02-28 DIAGNOSIS — R6521 Severe sepsis with septic shock: Secondary | ICD-10-CM | POA: Diagnosis not present

## 2022-02-28 DIAGNOSIS — A419 Sepsis, unspecified organism: Secondary | ICD-10-CM | POA: Diagnosis not present

## 2022-02-28 LAB — POCT I-STAT 7, (LYTES, BLD GAS, ICA,H+H)
Acid-Base Excess: 2 mmol/L (ref 0.0–2.0)
Bicarbonate: 28.9 mmol/L — ABNORMAL HIGH (ref 20.0–28.0)
Calcium, Ion: 1.09 mmol/L — ABNORMAL LOW (ref 1.15–1.40)
HCT: 26 % — ABNORMAL LOW (ref 39.0–52.0)
Hemoglobin: 8.8 g/dL — ABNORMAL LOW (ref 13.0–17.0)
O2 Saturation: 100 %
Patient temperature: 98.4
Potassium: 5.4 mmol/L — ABNORMAL HIGH (ref 3.5–5.1)
Sodium: 134 mmol/L — ABNORMAL LOW (ref 135–145)
TCO2: 31 mmol/L (ref 22–32)
pCO2 arterial: 60.1 mmHg — ABNORMAL HIGH (ref 32–48)
pH, Arterial: 7.289 — ABNORMAL LOW (ref 7.35–7.45)
pO2, Arterial: 326 mmHg — ABNORMAL HIGH (ref 83–108)

## 2022-02-28 LAB — RENAL FUNCTION PANEL
Albumin: 2.1 g/dL — ABNORMAL LOW (ref 3.5–5.0)
Albumin: 2 g/dL — ABNORMAL LOW (ref 3.5–5.0)
Anion gap: 12 (ref 5–15)
Anion gap: 10 (ref 5–15)
BUN: 67 mg/dL — ABNORMAL HIGH (ref 6–20)
BUN: 71 mg/dL — ABNORMAL HIGH (ref 6–20)
CO2: 24 mmol/L (ref 22–32)
CO2: 22 mmol/L (ref 22–32)
Calcium: 7.9 mg/dL — ABNORMAL LOW (ref 8.9–10.3)
Calcium: 8 mg/dL — ABNORMAL LOW (ref 8.9–10.3)
Chloride: 100 mmol/L (ref 98–111)
Chloride: 103 mmol/L (ref 98–111)
Creatinine, Ser: 3.22 mg/dL — ABNORMAL HIGH (ref 0.61–1.24)
Creatinine, Ser: 2.93 mg/dL — ABNORMAL HIGH (ref 0.61–1.24)
GFR, Estimated: 21 mL/min — ABNORMAL LOW (ref >60)
GFR, Estimated: 24 mL/min — ABNORMAL LOW (ref >60)
Glucose, Bld: 149 mg/dL — ABNORMAL HIGH (ref 70–99)
Glucose, Bld: 148 mg/dL — ABNORMAL HIGH (ref 70–99)
Phosphorus: 5.6 mg/dL — ABNORMAL HIGH (ref 2.5–4.6)
Phosphorus: 6.8 mg/dL — ABNORMAL HIGH (ref 2.5–4.6)
Potassium: 5.1 mmol/L (ref 3.5–5.1)
Potassium: 5 mmol/L (ref 3.5–5.1)
Sodium: 136 mmol/L (ref 135–145)
Sodium: 135 mmol/L (ref 135–145)

## 2022-02-28 LAB — CBC
HCT: 26.4 % — ABNORMAL LOW (ref 39.0–52.0)
Hemoglobin: 8.6 g/dL — ABNORMAL LOW (ref 13.0–17.0)
MCH: 30.2 pg (ref 26.0–34.0)
MCHC: 32.6 g/dL (ref 30.0–36.0)
MCV: 92.6 fL (ref 80.0–100.0)
Platelets: 67 10*3/uL — ABNORMAL LOW (ref 150–400)
RBC: 2.85 MIL/uL — ABNORMAL LOW (ref 4.22–5.81)
RDW: 16.4 % — ABNORMAL HIGH (ref 11.5–15.5)
WBC: 8.2 10*3/uL (ref 4.0–10.5)
nRBC: 1.9 % — ABNORMAL HIGH (ref 0.0–0.2)

## 2022-02-28 LAB — APTT
aPTT: 54 seconds — ABNORMAL HIGH (ref 24–36)
aPTT: 41 seconds — ABNORMAL HIGH (ref 24–36)
aPTT: 51 seconds — ABNORMAL HIGH (ref 24–36)

## 2022-02-28 LAB — BASIC METABOLIC PANEL
Anion gap: 12 (ref 5–15)
BUN: 67 mg/dL — ABNORMAL HIGH (ref 6–20)
CO2: 24 mmol/L (ref 22–32)
Calcium: 7.9 mg/dL — ABNORMAL LOW (ref 8.9–10.3)
Chloride: 99 mmol/L (ref 98–111)
Creatinine, Ser: 3.22 mg/dL — ABNORMAL HIGH (ref 0.61–1.24)
GFR, Estimated: 21 mL/min — ABNORMAL LOW (ref >60)
Glucose, Bld: 151 mg/dL — ABNORMAL HIGH (ref 70–99)
Potassium: 5 mmol/L (ref 3.5–5.1)
Sodium: 135 mmol/L (ref 135–145)

## 2022-02-28 LAB — PHOSPHORUS: Phosphorus: 5.6 mg/dL — ABNORMAL HIGH (ref 2.5–4.6)

## 2022-02-28 LAB — GLUCOSE, CAPILLARY
Glucose-Capillary: 144 mg/dL — ABNORMAL HIGH (ref 70–99)
Glucose-Capillary: 149 mg/dL — ABNORMAL HIGH (ref 70–99)
Glucose-Capillary: 125 mg/dL — ABNORMAL HIGH (ref 70–99)
Glucose-Capillary: 130 mg/dL — ABNORMAL HIGH (ref 70–99)
Glucose-Capillary: 108 mg/dL — ABNORMAL HIGH (ref 70–99)
Glucose-Capillary: 122 mg/dL — ABNORMAL HIGH (ref 70–99)

## 2022-02-28 LAB — MAGNESIUM: Magnesium: 2.8 mg/dL — ABNORMAL HIGH (ref 1.7–2.4)

## 2022-02-28 MED ORDER — PRISMASOL BGK 0/2.5 32-2.5 MEQ/L EC SOLN
Status: DC
  Filled 2022-02-28 (×8): qty 5000

## 2022-02-28 MED ORDER — HYDROCORTISONE SOD SUC (PF) 100 MG IJ SOLR
50.0000 mg | Freq: Two times a day (BID) | INTRAMUSCULAR | Status: DC
  Administered 2022-02-28 – 2022-03-01 (×2): 50 mg via INTRAVENOUS
  Filled 2022-02-28 (×2): qty 2

## 2022-02-28 MED ORDER — OXYCODONE HCL 5 MG PO TABS
5.0000 mg | ORAL_TABLET | ORAL | Status: DC
  Administered 2022-02-28 – 2022-03-02 (×12): 5 mg
  Filled 2022-02-28 (×12): qty 1

## 2022-02-28 MED ORDER — PRISMASOL BGK 0/2.5 32-2.5 MEQ/L EC SOLN
Status: DC
  Filled 2022-02-28 (×39): qty 5000

## 2022-02-28 MED ORDER — MIDAZOLAM HCL 2 MG/2ML IJ SOLN
2.0000 mg | Freq: Once | INTRAMUSCULAR | Status: AC
  Administered 2022-02-28: 2 mg via INTRAVENOUS
  Filled 2022-02-28: qty 2

## 2022-02-28 MED ORDER — PRISMASOL BGK 0/2.5 32-2.5 MEQ/L EC SOLN
Status: DC
  Filled 2022-02-28 (×10): qty 5000

## 2022-02-28 MED ORDER — DEXMEDETOMIDINE HCL IN NACL 400 MCG/100ML IV SOLN
0.0000 ug/kg/h | INTRAVENOUS | Status: DC
  Administered 2022-02-28: 0.4 ug/kg/h via INTRAVENOUS
  Administered 2022-02-28: 0.5 ug/kg/h via INTRAVENOUS
  Administered 2022-03-01: 0.7 ug/kg/h via INTRAVENOUS
  Administered 2022-03-01: 0.5 ug/kg/h via INTRAVENOUS
  Administered 2022-03-01: 0.9 ug/kg/h via INTRAVENOUS
  Administered 2022-03-01: 0.7 ug/kg/h via INTRAVENOUS
  Administered 2022-03-01: 0.4 ug/kg/h via INTRAVENOUS
  Administered 2022-03-02: 1.1 ug/kg/h via INTRAVENOUS
  Administered 2022-03-02: 0.5 ug/kg/h via INTRAVENOUS
  Filled 2022-02-28 (×2): qty 200
  Filled 2022-02-28 (×6): qty 100

## 2022-02-28 NOTE — Progress Notes (Signed)
? ?NAME:  Jeffrey Ayala, MRN:  [id number], DOB:  07-Jul-1961, LOS: 6 ?ADMISSION DATE:  02/22/2022, CONSULTATION DATE:  4/2 ?REFERRING MD:  Doren Custard- ED CHIEF COMPLAINT: Septic shock ? ?History of Present Illness:  ?Jeffrey Ayala, is a 61 y.o. male, who presented to the [facility] ED with a chief complaint of nausea, vomiting, diarrhea. ? ?They have a pertinent past medical history of gastric ulcer, kidney stones, eosinophilic esophagitis, migraines  ? ?History obtained from [name] (spouse), on 4/1 patient began complaining of feeling unwell. Wife denies sick contacts, questionable food.  Wife denies taking any meds currently.  Around 2 PM on 4/2 the patient developed nausea, vomiting, and diarrhea while at a birthday party and soiled himself. EMS was called. When EMS arrived he was found to be in A-fib with a rate of 120-160. ? ?At the ED he was found to be febrile with a Tmax of 102.9.  A code sepsis was called in the emergency department, blood cultures were drawn, 2 L of IV fluid were given, vanc and cefepime were ordered.  CT chest negative for acute process.  CT abdomen pelvis with and without contrast notable for swollen scrotum concerning for infection, colon filled with diarrhea.  Urology and cardiology were consulted by the ED.  Despite fluid resuscitation the patient developed hypotension requiring vasopressors. ? ?PCCM was consulted for admission. ? ?Pertinent  Medical History  ?gastric ulcer, kidney stones, eosinophilic esophagitis, migraines  ? ?Significant Hospital Events: ?Including procedures, antibiotic start and stop dates in addition to other pertinent events   ?4/2 Presented to the ED. CT chest negative for acute process.  CT abdomen pelvis with and without contrast notable for swollen scrotum concerning for infection, colon filled with diarrhea.  Urology and cardiology were consulted by the ED. Vanc, cefepime started.  ?4/3 Intubated overnight, brief cardiac arrest. Escalating pressors. ?4/5  started on methylene blue ?4/6 started on CRRT ?4/8 appears more responsive ? ?Interim History / Subjective:  ?Overnight no issues.  Pressor requirements are improving ?Attest to be more interactive and follows commands ?Objective   ?Blood pressure 119/68, pulse (!) 110, temperature 98.3 ?F (36.8 ?C), temperature source Bladder, resp. rate 14, height '5\' 10"'$  (1.778 m), weight 131.1 kg, SpO2 98 %.  ?   ?Vent Mode: PSV;CPAP ?FiO2 (%):  [40 %] 40 % ?Set Rate:  [22 bmp] 22 bmp ?Vt Set:  [600 mL] 600 mL ?PEEP:  [5 cmH20] 5 cmH20 ?Pressure Support:  [3 cmH20] 3 cmH20 ?Plateau Pressure:  [21 NWG95-62 cmH20] 22 cmH20  ? ?Intake/Output Summary (Last 24 hours) at 02/28/2022 1152 ?Last data filed at 02/28/2022 1140 ?Gross per 24 hour  ?Intake 3192.23 ml  ?Output 5336 ml  ?Net -2143.77 ml  ? ?Filed Weights  ? 02/26/22 0457 02/27/22 0414 02/28/22 0500  ?Weight: 133.6 kg 135 kg 131.1 kg  ? ? ?Examination: ?Gen:      Acutely ill-appearing ?HEENT: Endotracheal tube in place ?Lungs:    Clear breath sounds anteriorly  ?CV:         S1-S2 appreciated ?Abd:      Soft, distended, significant scrotal edema and excoriation.  Some sloughing of the skin which appears superficial. ?Ext:    Mild extremity edema ?Skin:      Skin is warm, mottling of the skin both feet ?neuro:   sedated, RASS-1 ? ? ?Blood culture-no growth to date ? ?Labs/blood work reviewed ?-BUN 67/creatinine 3.22 ? ?Assessment & Plan:  ? ?Septic shock ?-Continue Zosyn ?-Pressor requirement improving ?-  On stress dose steroids-decrease from 100 twice daily of hydrocortisone to 50 every 12 ? ?Scrotal edema ?-Had a repeat CT scan of the abdomen/pelvis today- ?-Still with significant edema, no evidence of abscess collection ?-Surgical intervention not indicated, did discuss with urology ? ?Atrial fibrillation with RVR ?-Likely secondary to sepsis ?-Rate is controlled at present ?-On Bivalrudin ? ?Acute hypoxic respiratory failure requiring mechanical ventilation ?-He is weaning  today ?-We will plan to liberate from the ventilator if hemodynamics continue to stabilize and mental status continue to improve ? ?Acute kidney injury ?-Appreciate renal input ?-Maintain renal perfusion ?-Continue CRRT with volume removal ? ?Shock liver ?-Continue to monitor closely ? ?Metabolic encephalopathy ?-On ketamine, fentanyl ? ?Hyperglycemia ?-Continue SSI ? ?Leukocytosis ?-Continue to monitor ?-Cultures have been negative ? ?Gastric ulcer ?-Continue PPI ? ?Splenomegaly on previous CT ?-Continue follow-up ? ?At risk for malnutrition ?-Continue tube feeds ? ?CT scan findings reviewed ? ?Wife and son updated at bedside 4/8.  ? ?Best Practice (right click and "Reselect all SmartList Selections" daily)  ? ?Diet/type: tubefeeds ?DVT prophylaxis: on bivalirudin due to unable to achieve therapeutic dosing on heparin and high risk for clotting.  ?GI prophylaxis: PPI ?Lines: Central line, Arterial Line, and yes and it is still needed ?Foley:  Yes, and it is still needed ?Code Status:  full code ?Last date of multidisciplinary goals of care discussion [4/2 Discussed with wife Jeffrey Ayala at bedside.  Full scope of care.  Once CPR and intubation if needed] ? ?Labs   ?CBC: ?Recent Labs  ?Lab 02/22/22 ?1805 02/22/22 ?1819 02/24/22 ?4315 02/24/22 ?4008 02/25/22 ?6761 02/26/22 ?9509 02/26/22 ?3267 02/27/22 ?1245 02/28/22 ?0413  ?WBC 2.3*   < > 19.7*  --  21.9* 15.8*  --  10.1 8.2  ?NEUTROABS 2.2  --   --   --   --   --   --   --   --   ?HGB 13.1   < > 11.4*  --  10.0* 9.4*  --  8.9* 8.6*  ?HCT 40.6   < > 33.1*  --  30.6* 27.9*  --  27.5* 26.4*  ?MCV 92.5   < > 88.7  --  91.6 89.1  --  90.8 92.6  ?PLT 193   < > 82*   < > 63* 43* 40* 47* 67*  ? < > = values in this interval not displayed.  ? ? ?Basic Metabolic Panel: ?Recent Labs  ?Lab 02/24/22 ?8099 02/24/22 ?1601 02/25/22 ?0434 02/25/22 ?0645 02/26/22 ?8338 02/26/22 ?1600 02/27/22 ?2505 02/27/22 ?1503 02/28/22 ?0413  ?NA 140   < >  --    < > 134* 133* 134* 134* 135   136  ?K 4.0   < >  --    < > 3.9 5.5* 4.7 5.3* 5.0  5.1  ?CL 103   < >  --    < > 92* 91* 97* 97* 99  100  ?CO2 19*   < >  --    < > '22 23 24 24 24  24  '$ ?GLUCOSE 117*   < >  --    < > 160* 166* 158* 148* 151*  149*  ?BUN 45*   < >  --    < > 98* 110* 87* 74* 67*  67*  ?CREATININE 4.08*   < >  --    < > 6.65* 6.87* 5.10* 3.91* 3.22*  3.22*  ?CALCIUM 6.7*   < >  --    < >  6.2* 6.5* 7.0* 7.3* 7.9*  7.9*  ?MG 1.8  --  2.0  --  2.2  --  2.4  --  2.8*  ?PHOS 4.9*  --  4.8*  --  5.4* 7.1* 5.7* 5.9* 5.6*  5.6*  ? < > = values in this interval not displayed.  ? ?GFR: ?Estimated Creatinine Clearance: 33.2 mL/min (A) (by C-G formula based on SCr of 3.22 mg/dL (H)). ?Recent Labs  ?Lab 02/23/22 ?1200 02/23/22 ?1846 02/24/22 ?8527 02/24/22 ?7824 02/24/22 ?1601 02/25/22 ?2353 02/26/22 ?6144 02/27/22 ?3154 02/28/22 ?0413  ?WBC  --   --   --    < >  --  21.9* 15.8* 10.1 8.2  ?LATICACIDVEN 7.7* 3.9* 3.6*  --  3.9*  --   --   --   --   ? < > = values in this interval not displayed.  ? ? ?Liver Function Tests: ?Recent Labs  ?Lab 02/22/22 ?1805 02/24/22 ?0086 02/25/22 ?0434 02/26/22 ?0427 02/26/22 ?1600 02/27/22 ?0317 02/27/22 ?1503 02/28/22 ?0413  ?AST 42* 88* 87* 85*  --  97*  --   --   ?ALT 33 53* 54* 49*  --  62*  --   --   ?ALKPHOS 99 68 96 152*  --  202*  --   --   ?BILITOT 1.8* 7.7* 11.8* 13.9*  --  15.4*  --   --   ?PROT 6.3* 4.9* 4.6* 4.9*  --  5.1*  --   --   ?ALBUMIN 3.8 2.6* 2.3* 2.2* 2.1* 2.1* 2.0* 2.1*  ? ?No results for input(s): LIPASE, AMYLASE in the last 168 hours. ?No results for input(s): AMMONIA in the last 168 hours. ? ?ABG ?   ?Component Value Date/Time  ? PHART 7.121 (LL) 02/23/2022 0442  ? PCO2ART 45.0 02/23/2022 0442  ? PO2ART 223 (H) 02/23/2022 0442  ? HCO3 14.6 (L) 02/23/2022 0442  ? TCO2 16 (L) 02/23/2022 0442  ? ACIDBASEDEF 14.0 (H) 02/23/2022 0442  ? O2SAT 85.9 02/26/2022 0427  ? ?  ? ?Coagulation Profile: ?Recent Labs  ?Lab 02/22/22 ?1805 02/24/22 ?7619 02/26/22 ?5093  ?INR 1.3* 2.2* 1.3*  ? ?The  patient is critically ill with multiple organ systems failure and requires high complexity decision making for assessment and support, frequent evaluation and titration of therapies, application of advanced monitoring techn

## 2022-02-28 NOTE — Progress Notes (Signed)
Wasted 50 cc of ketamine with Stan Head, RN ?

## 2022-02-28 NOTE — Progress Notes (Signed)
ANTICOAGULATION CONSULT NOTE ? ?Pharmacy Consult for heparin > bivalirudin ?Indication: atrial fibrillation ? ?Allergies  ?Allergen Reactions  ? Codeine Hives  ?  Insomnia, jittery  ? Flomax [Tamsulosin Hcl] Nausea Only  ?  Nausea and fatigue  ? ? ?Patient Measurements: ?Height: '5\' 10"'$  (177.8 cm) ?Weight: 131.1 kg (289 lb 0.4 oz) ?IBW/kg (Calculated) : 73 ?Heparin Dosing Weight: 95 kg ? ?Vital Signs: ?Temp: 97.8 ?F (36.6 ?C) (04/08 1017) ?Temp Source: Bladder (04/08 5102) ?BP: 130/66 (04/08 0700) ?Pulse Rate: 92 (04/08 0715) ? ?Labs: ?Recent Labs  ?  02/25/22 ?1659 02/25/22 ?1748 02/26/22 ?0427 02/26/22 ?0427 02/26/22 ?0851 02/26/22 ?1334 02/27/22 ?0317 02/27/22 ?1503 02/27/22 ?1648 02/28/22 ?0413  ?HGB  --   --  9.4*   < >  --   --  8.9*  --   --  8.6*  ?HCT  --   --  27.9*  --   --   --  27.5*  --   --  26.4*  ?PLT  --   --  43*   < > 40*  --  47*  --   --  67*  ?APTT  --   --   --   --  119*   < > 65*  --  54* 54*  ?LABPROT  --   --   --   --  15.8*  --   --   --   --   --   ?INR  --   --   --   --  1.3*  --   --   --   --   --   ?HEPARINUNFRC  --  0.19* 0.20*  --   --   --   --   --   --   --   ?CREATININE  --   --  6.65*  --   --    < > 5.10* 3.91*  --  3.22*  3.22*  ?CKTOTAL 978*  --   --   --   --   --   --   --   --   --   ? < > = values in this interval not displayed.  ? ? ? ?Estimated Creatinine Clearance: 33.2 mL/min (A) (by C-G formula based on SCr of 3.22 mg/dL (H)). ? ? ?Assessment:  ?61 y.o. male with new onset Afib RVR, likely secondary to sepsis.  Urology seeing for hematuria.  DIC panel on 4/4 showed elevated aPTT at >200 s and elevated INR at 2.2. Stat heparin level ordered afterwards  and resulted subtherapeutic (0.24). Discussed with CCM and this is likely due to sepsis. Repeat DIC panel 4/6 still negative, AT3 ordered and 36 indicating AT3 deficiency. Patient was transitioned from heparin gtt to bivalirudin gtt after dialysis catheter insertion on 4/6.  ? ?aPTT 54 on bivalirudin 0.05 mg/kg/hr  is therapeutic. Hgb 8.6. Plt 67 - increase. No bleeding noted per RN.  ? ?Plan: ?Continue bivalirudin 0.05 mg/kg/h (use total body weight) ?Continue to check aPTT q12h ?Check CBC daily  ?Monitor for s/s of bleeding  ? ?Cristela Felt, PharmD, BCPS ?Clinical Pharmacist ?02/28/2022 7:29 AM ? ? ?

## 2022-02-28 NOTE — Progress Notes (Signed)
?[facility] ?Progress Note  ? ? ?Assessment/ Plan:   ?AKI: in the setting of severe septic shock- oliguric now- no hard indications for starting RRT today but will need in next 24 hrs ?            - started CRRT 02/26/22 ? - change to 2K bath 02/28/22 with K slightly higher, systemic hep gtt changed to bivalirudin ? - repleting Ca as needed  ?            - tolerating even- increase uF goal to 100-200 mL/ hr ?            - sending CK in setting of hypocalcemia- 900s ?  ?  ?2.  Septic shock ?            - severe ?            - on pressors, stress dose steroids, IV antibiotics (linezolid and zosyn) methylene blue ?            - likely source is scrotum-- getting CT today  ?  ?3.  Scrotal infection ?            - urology following, getting CT today ?  ?4.  Acute hypoxic RF ?            - intubated per PCCM ? ?5.  Thrombocytopenia/ coagulopathy: abd US with cirrhosis, elevated Tbili no schistocytes on smear, likely sepsis related (? Shock liver) ? ?6.  Hypocalcemia: aggressive repletion ? ?7.  Afib with RVR- hep--> bivalirudin gtt ?  ?8. Dispo: ICU ? ?Subjective:   ? ?Doing well on CRRT- per PCCM, going for repeat CT today.  Off pressors, tolerating fluid removal.  ? ?Objective:   ?BP 109/62   Pulse 94   Temp 97.7 ?F (36.5 ?C) (Bladder)   Resp 13   Ht 5' 10" (1.778 m)   Wt 131.1 kg   SpO2 100%   BMI 41.47 kg/m?  ? ?Intake/Output Summary (Last 24 hours) at 02/28/2022 1007 ?Last data filed at 02/28/2022 0900 ?Gross per 24 hour  ?Intake 2989.8 ml  ?Output 5524 ml  ?Net -2534.2 ml  ? ?Weight change: -3.9 kg ? ?Physical Exam: ?GEN: ill-appearing, awake and able to respond to voice today ?HEENT eyes opened, + icteric  ?PULM coarse bilaterally, intubated ?CV tachycardic ?ABD distended, still soft, hypoactive bowel sounds ?GU: swollen scrotum with deeply violaceous discoloration with some weeping ?EXT 2 + anasarca, mottled and cool bilateral soles of feet and toes, + jaundiced ?NEURO intubated  ? ?Imaging: ?DG  CHEST PORT 1 VIEW ? ?Result Date: 02/26/2022 ?CLINICAL DATA:  Central line placement EXAM: PORTABLE CHEST 1 VIEW COMPARISON:  02/23/2022 FINDINGS: Interval placement of large-bore multi lumen left neck vascular catheter, tip near the confluence of the superior vena cava. Otherwise unchanged support apparatus and AP portable chest radiograph, including endotracheal tube, esophagogastric tube, and right neck vascular catheter. Cardiomegaly with diffuse bilateral interstitial opacity. No new airspace opacity. IMPRESSION: 1. Interval placement of large-bore multi lumen left neck vascular catheter, tip near the confluence of the superior vena cava. 2. Otherwise unchanged support apparatus and AP portable chest radiograph. Electronically Signed   By: Alex D Bibbey M.D.   On: 02/26/2022 15:06   ? ?Labs: ?BMET ?Recent Labs  ?Lab 02/24/22 ?0520 02/24/22 ?1601 02/25/22 ?0434 02/25/22 ?0645 02/26/22 ?0427 02/26/22 ?1600 02/27/22 ?0317 02/27/22 ?1503 02/28/22 ?0413  ?NA 140 140  --  137 134* 133* 134* 134* 135  136  ?  K 4.0 4.2  --  3.8 3.9 5.5* 4.7 5.3* 5.0  5.1  ?CL 103 101  --  99 92* 91* 97* 97* 99  100  ?CO2 19* 19*  --  21* 22 23 24 24 24  24  ?GLUCOSE 117* 109*  --  168* 160* 166* 158* 148* 151*  149*  ?BUN 45* 60*  --  79* 98* 110* 87* 74* 67*  67*  ?CREATININE 4.08* 4.84*  --  5.67* 6.65* 6.87* 5.10* 3.91* 3.22*  3.22*  ?CALCIUM 6.7* 6.6*  --  6.2* 6.2* 6.5* 7.0* 7.3* 7.9*  7.9*  ?PHOS 4.9*  --  4.8*  --  5.4* 7.1* 5.7* 5.9* 5.6*  5.6*  ? ?CBC ?Recent Labs  ?Lab 02/22/22 ?1805 02/22/22 ?1819 02/25/22 ?0434 02/26/22 ?0427 02/26/22 ?0851 02/27/22 ?0317 02/28/22 ?0413  ?WBC 2.3*   < > 21.9* 15.8*  --  10.1 8.2  ?NEUTROABS 2.2  --   --   --   --   --   --   ?HGB 13.1   < > 10.0* 9.4*  --  8.9* 8.6*  ?HCT 40.6   < > 30.6* 27.9*  --  27.5* 26.4*  ?MCV 92.5   < > 91.6 89.1  --  90.8 92.6  ?PLT 193   < > 63* 43* 40* 47* 67*  ? < > = values in this interval not displayed.  ? ? ?Medications:   ? ? vitamin C  250 mg Per  Tube BID  ? B-complex with vitamin C  1 tablet Per Tube Daily  ? chlorhexidine gluconate (MEDLINE KIT)  15 mL Mouth Rinse BID  ? Chlorhexidine Gluconate Cloth  6 each Topical Q0600  ? feeding supplement (PROSource TF)  90 mL Per Tube TID  ? fludrocortisone  0.1 mg Per Tube Daily  ? folic acid  1 mg Per Tube Daily  ? hydrocortisone sod succinate (SOLU-CORTEF) inj  100 mg Intravenous Q12H  ? insulin aspart  0-15 Units Subcutaneous Q4H  ? mouth rinse  15 mL Mouth Rinse 10 times per day  ? midazolam  2 mg Intravenous Once  ? pantoprazole (PROTONIX) IV  40 mg Intravenous QHS  ? sodium chloride flush  10-40 mL Intracatheter Q12H  ? ? ? ? ?  MD ?02/28/2022, 10:07 AM   ?

## 2022-02-28 NOTE — Progress Notes (Signed)
Pt transported on vent from 3M11 to CT and back without any complications. RN at bedside, RT will continue to monitor.  ?

## 2022-02-28 NOTE — Progress Notes (Signed)
Pt placed back on full vent support due to ABG, and increased WOB. Pt tolerating well, RN at bedside, RT will continue to monitor.  ?

## 2022-02-28 NOTE — Progress Notes (Signed)
?  Subjective: ?WBCs trending better this AM. No acute events otherwise ? ?GU Exam today is similar to yesterday ? ?Objective: ?Vital signs in last 24 hours: ?Temp:  [97.7 ?F (36.5 ?C)-98.4 ?F (36.9 ?C)] 97.7 ?F (36.5 ?C) (04/08 8416) ?Pulse Rate:  [78-102] 94 (04/08 0900) ?Resp:  [9-25] 13 (04/08 0900) ?BP: (109-150)/(59-80) 109/62 (04/08 0900) ?SpO2:  [86 %-100 %] 100 % (04/08 0900) ?Arterial Line BP: (78-168)/(49-78) 120/52 (04/08 0900) ?FiO2 (%):  [40 %] 40 % (04/08 0856) ?Weight:  [131.1 kg] 131.1 kg (04/08 0500) ? ?Intake/Output from previous day: ?04/07 0701 - 04/08 0700 ?In: 3393.9 [I.V.:1331.6; NG/GT:1295; IV Piggyback:767.3] ?Out: 5705 [Urine:26] ?Intake/Output this shift: ?Total I/O ?In: 209.8 [I.V.:109.8; NG/GT:100] ?Out: 400 [Other:400] ? ?Physical Exam:  ?General: Alert and oriented ?CV: RRR ?Lungs: Clear ?Abdomen: Soft, ND, ATTP ?Ext: NT, No erythema ?GU: essentially  unchanged from my exam yesterday - scrotum and penis edematous. There is scrotal edema and ecchymosis/purpura with some superficial sloughing + viable pink tissue underneath, overall seems consistent with descriptions by Dr Junious Silk and Alinda Money  ? ?Lab Results: ?Recent Labs  ?  02/26/22 ?6063 02/27/22 ?0317 02/28/22 ?0413  ?HGB 9.4* 8.9* 8.6*  ?HCT 27.9* 27.5* 26.4*  ? ? ?BMET ?Recent Labs  ?  02/27/22 ?1503 02/28/22 ?0413  ?NA 134* 135  136  ?K 5.3* 5.0  5.1  ?CL 97* 99  100  ?CO2 '24 24  24  '$ ?GLUCOSE 148* 151*  149*  ?BUN 74* 67*  67*  ?CREATININE 3.91* 3.22*  3.22*  ?CALCIUM 7.3* 7.9*  7.9*  ? ? ? ? ?Studies/Results: ?DG CHEST PORT 1 VIEW ? ?Result Date: 02/26/2022 ?CLINICAL DATA:  Central line placement EXAM: PORTABLE CHEST 1 VIEW COMPARISON:  02/23/2022 FINDINGS: Interval placement of large-bore multi lumen left neck vascular catheter, tip near the confluence of the superior vena cava. Otherwise unchanged support apparatus and AP portable chest radiograph, including endotracheal tube, esophagogastric tube, and right neck  vascular catheter. Cardiomegaly with diffuse bilateral interstitial opacity. No new airspace opacity. IMPRESSION: 1. Interval placement of large-bore multi lumen left neck vascular catheter, tip near the confluence of the superior vena cava. 2. Otherwise unchanged support apparatus and AP portable chest radiograph. Electronically Signed   By: Delanna Ahmadi M.D.   On: 02/26/2022 15:06   ? ?Assessment/Plan: ?Scrotal Edema - no surgical indication at this time. Patient getting a CT scan done this AM and will f/u results. Will continue to follow ? ? LOS: 6 days  ? ?Donald Pore MD ?02/28/2022, 9:53 AM ?[facility]  ?Pager: [phone] ? ? ? ?

## 2022-02-28 NOTE — Plan of Care (Signed)
?  Problem: Nutrition: ?Goal: Adequate nutrition will be maintained ?Outcome: Progressing ?  ?Problem: Elimination: ?Goal: Will not experience complications related to bowel motility ?Outcome: Progressing ?Goal: Will not experience complications related to urinary retention ?Outcome: Progressing ?  ?Problem: Respiratory: ?Goal: Ability to maintain adequate ventilation will improve ?Outcome: Progressing ?  ?Problem: Activity: ?Goal: Ability to tolerate increased activity will improve ?Outcome: Progressing ?  ?Problem: Respiratory: ?Goal: Ability to maintain a clear airway and adequate ventilation will improve ?Outcome: Progressing ?  ?Problem: Role Relationship: ?Goal: Method of communication will improve ?Outcome: Progressing ?  ?

## 2022-02-28 NOTE — Progress Notes (Addendum)
ANTICOAGULATION CONSULT NOTE ? ?Pharmacy Consult for heparin > bivalirudin ?Indication: atrial fibrillation ? ?Allergies  ?Allergen Reactions  ? Codeine Hives  ?  Insomnia, jittery  ? Flomax [Tamsulosin Hcl] Nausea Only  ?  Nausea and fatigue  ? ? ?Patient Measurements: ?Height: '5\' 10"'$  (177.8 cm) ?Weight: 131.1 kg (289 lb 0.4 oz) ?IBW/kg (Calculated) : 73 ?Heparin Dosing Weight: 95 kg ? ?Vital Signs: ?Temp: 96.6 ?F (35.9 ?C) (04/08 1600) ?Temp Source: Bladder (04/08 1600) ?BP: 105/57 (04/08 1600) ?Pulse Rate: 79 (04/08 1600) ? ?Labs: ?Recent Labs  ?  02/25/22 ?1748 02/26/22 ?0427 02/26/22 ?0427 02/26/22 ?0851 02/26/22 ?1334 02/27/22 ?0317 02/27/22 ?1503 02/27/22 ?1648 02/28/22 ?0413 02/28/22 ?1222 02/28/22 ?1611  ?HGB  --  9.4*   < >  --   --  8.9*  --   --  8.6* 8.8*  --   ?HCT  --  27.9*   < >  --   --  27.5*  --   --  26.4* 26.0*  --   ?PLT  --  43*   < > 40*  --  47*  --   --  67*  --   --   ?APTT  --   --   --  119*   < > 65*  --  54* 54*  --  41*  ?LABPROT  --   --   --  15.8*  --   --   --   --   --   --   --   ?INR  --   --   --  1.3*  --   --   --   --   --   --   --   ?HEPARINUNFRC 0.19* 0.20*  --   --   --   --   --   --   --   --   --   ?CREATININE  --  6.65*  --   --    < > 5.10* 3.91*  --  3.22*  3.22*  --  2.93*  ? < > = values in this interval not displayed.  ? ? ? ?Estimated Creatinine Clearance: 36.5 mL/min (A) (by C-G formula based on SCr of 2.93 mg/dL (H)). ? ? ?Assessment:  ?61 y.o. male with new onset Afib RVR, likely secondary to sepsis.  Urology seeing for hematuria.  DIC panel on 4/4 showed elevated aPTT at >200 s and elevated INR at 2.2. Stat heparin level ordered afterwards  and resulted subtherapeutic (0.24). Discussed with CCM and this is likely due to sepsis. Repeat DIC panel 4/6 still negative, AT3 ordered and 36 indicating AT3 deficiency. Patient was transitioned from heparin gtt to bivalirudin gtt after dialysis catheter insertion on 4/6.  ? ?Repeat aPTT this afternoon is  subtherapeutic at 41 seconds, likely due to increase volume removal on CRRT.  ? ?Goal: ?aPTT 50-85 seconds ? ?Plan: ?Increase bivalirudin to 0.06 mg/kg/h (use total body weight) ?Repeat aPTT in 4h ? ?ADDENDUM 2215: aPTT repeat is within therapeutic range at 51 seconds, continue rate above and recheck with am lab draw ? ?Arrie Senate, PharmD, BCPS, BCCP ?Clinical Pharmacist ?[phone] ?Please check AMION for all [facility] numbers ?02/28/2022 ? ? ? ?

## 2022-03-01 DIAGNOSIS — R6521 Severe sepsis with septic shock: Secondary | ICD-10-CM | POA: Diagnosis not present

## 2022-03-01 DIAGNOSIS — A419 Sepsis, unspecified organism: Secondary | ICD-10-CM | POA: Diagnosis not present

## 2022-03-01 LAB — RENAL FUNCTION PANEL
Albumin: 2 g/dL — ABNORMAL LOW (ref 3.5–5.0)
Albumin: 2.1 g/dL — ABNORMAL LOW (ref 3.5–5.0)
Anion gap: 10 (ref 5–15)
Anion gap: 11 (ref 5–15)
BUN: 64 mg/dL — ABNORMAL HIGH (ref 6–20)
BUN: 60 mg/dL — ABNORMAL HIGH (ref 6–20)
CO2: 24 mmol/L (ref 22–32)
CO2: 23 mmol/L (ref 22–32)
Calcium: 8 mg/dL — ABNORMAL LOW (ref 8.9–10.3)
Calcium: 8 mg/dL — ABNORMAL LOW (ref 8.9–10.3)
Chloride: 102 mmol/L (ref 98–111)
Chloride: 102 mmol/L (ref 98–111)
Creatinine, Ser: 2.56 mg/dL — ABNORMAL HIGH (ref 0.61–1.24)
Creatinine, Ser: 2.38 mg/dL — ABNORMAL HIGH (ref 0.61–1.24)
GFR, Estimated: 28 mL/min — ABNORMAL LOW (ref >60)
GFR, Estimated: 30 mL/min — ABNORMAL LOW (ref >60)
Glucose, Bld: 143 mg/dL — ABNORMAL HIGH (ref 70–99)
Glucose, Bld: 119 mg/dL — ABNORMAL HIGH (ref 70–99)
Phosphorus: 5.5 mg/dL — ABNORMAL HIGH (ref 2.5–4.6)
Phosphorus: 5 mg/dL — ABNORMAL HIGH (ref 2.5–4.6)
Potassium: 4.3 mmol/L (ref 3.5–5.1)
Potassium: 4.6 mmol/L (ref 3.5–5.1)
Sodium: 136 mmol/L (ref 135–145)
Sodium: 136 mmol/L (ref 135–145)

## 2022-03-01 LAB — CBC
HCT: 25.9 % — ABNORMAL LOW (ref 39.0–52.0)
Hemoglobin: 8.2 g/dL — ABNORMAL LOW (ref 13.0–17.0)
MCH: 29.3 pg (ref 26.0–34.0)
MCHC: 31.7 g/dL (ref 30.0–36.0)
MCV: 92.5 fL (ref 80.0–100.0)
Platelets: 76 10*3/uL — ABNORMAL LOW (ref 150–400)
RBC: 2.8 MIL/uL — ABNORMAL LOW (ref 4.22–5.81)
RDW: 16.4 % — ABNORMAL HIGH (ref 11.5–15.5)
WBC: 10 10*3/uL (ref 4.0–10.5)
nRBC: 0.5 % — ABNORMAL HIGH (ref 0.0–0.2)

## 2022-03-01 LAB — HEPATIC FUNCTION PANEL
ALT: 118 U/L — ABNORMAL HIGH (ref 0–44)
AST: 143 U/L — ABNORMAL HIGH (ref 15–41)
Albumin: 2 g/dL — ABNORMAL LOW (ref 3.5–5.0)
Alkaline Phosphatase: 465 U/L — ABNORMAL HIGH (ref 38–126)
Bilirubin, Direct: 12.4 mg/dL — ABNORMAL HIGH (ref 0.0–0.2)
Indirect Bilirubin: 5.5 mg/dL — ABNORMAL HIGH (ref 0.3–0.9)
Total Bilirubin: 17.9 mg/dL — ABNORMAL HIGH (ref 0.3–1.2)
Total Protein: 5.6 g/dL — ABNORMAL LOW (ref 6.5–8.1)

## 2022-03-01 LAB — APTT
aPTT: 55 seconds — ABNORMAL HIGH (ref 24–36)
aPTT: 59 seconds — ABNORMAL HIGH (ref 24–36)

## 2022-03-01 LAB — GLUCOSE, CAPILLARY
Glucose-Capillary: 113 mg/dL — ABNORMAL HIGH (ref 70–99)
Glucose-Capillary: 114 mg/dL — ABNORMAL HIGH (ref 70–99)
Glucose-Capillary: 116 mg/dL — ABNORMAL HIGH (ref 70–99)
Glucose-Capillary: 127 mg/dL — ABNORMAL HIGH (ref 70–99)
Glucose-Capillary: 130 mg/dL — ABNORMAL HIGH (ref 70–99)
Glucose-Capillary: 135 mg/dL — ABNORMAL HIGH (ref 70–99)

## 2022-03-01 LAB — AMMONIA: Ammonia: 45 umol/L — ABNORMAL HIGH (ref 9–35)

## 2022-03-01 LAB — MAGNESIUM: Magnesium: 2.7 mg/dL — ABNORMAL HIGH (ref 1.7–2.4)

## 2022-03-01 MED ORDER — PANTOPRAZOLE 2 MG/ML SUSPENSION
40.0000 mg | Freq: Every day | ORAL | Status: DC
  Administered 2022-03-01: 40 mg
  Filled 2022-03-01: qty 20

## 2022-03-01 MED ORDER — HYDROCORTISONE SOD SUC (PF) 100 MG IJ SOLR: 50.0000 mg | Freq: Every day | INTRAMUSCULAR | Status: DC

## 2022-03-01 NOTE — Progress Notes (Signed)
Urology Inpatient Progress Report ? ?Septic shock ([place]) [A41.9, R65.21] ? ? ? ?  ? ? ?Intv/Subj: ?Patient remains critically ill.  Wife is present.  She tells me that there is no change in the scrotal exam since this is my first time seeing. ? ?CT reviewed personally and discussed with the family.  No evidence of abscess or Fournier's. ? ?Principal Problem: ?  Septic shock ([place]) ?Active Problems: ?  Pressure injury of skin ?  Encounter for central line placement ? ?Current Facility-Administered Medications  ?Medication Dose Route Frequency Provider Last Rate Last Admin  ? 0.9 %  sodium chloride infusion  250 mL Intravenous Continuous Estill Cotta, NP 10 mL/hr at 03/01/22 1200 Infusion Verify at 03/01/22 1200  ? acetaminophen (TYLENOL) tablet 650 mg  650 mg Per Tube Q6H PRN Julian Hy, DO   650 mg at 02/25/22 1696  ? ascorbic acid (VITAMIN C) tablet 250 mg  250 mg Per Tube BID Noemi Chapel P, DO   250 mg at 03/01/22 7893  ? B-complex with vitamin C tablet 1 tablet  1 tablet Per Tube Daily Julian Hy, DO   1 tablet at 03/01/22 8101  ? bivalirudin (ANGIOMAX) 250 mg in sodium chloride 0.9 % 500 mL (0.5 mg/mL) infusion  0.06 mg/kg/hr Intravenous Continuous Einar Grad, RPH 16 mL/hr at 03/01/22 1200 0.06 mg/kg/hr at 03/01/22 1200  ? chlorhexidine gluconate (MEDLINE KIT) (PERIDEX) 0.12 % solution 15 mL  15 mL Mouth Rinse BID Olalere, Adewale A, MD   15 mL at 03/01/22 0743  ? Chlorhexidine Gluconate Cloth 2 % PADS 6 each  6 each Topical Q0600 Sherrilyn Rist A, MD   6 each at 02/28/22 2200  ? dexmedetomidine (PRECEDEX) 400 MCG/100ML (4 mcg/mL) infusion  0-1.2 mcg/kg/hr Intravenous Continuous Olalere, Adewale A, MD 22.9 mL/hr at 03/01/22 1200 0.7 mcg/kg/hr at 03/01/22 1200  ? docusate sodium (COLACE) capsule 100 mg  100 mg Oral BID PRN Estill Cotta, NP      ? feeding supplement (PROSource TF) liquid 90 mL  90 mL Per Tube TID Noemi Chapel P, DO   90 mL at 03/01/22 0906  ? feeding supplement (VITAL  1.5 CAL) liquid 1,000 mL  1,000 mL Per Tube Continuous Spero Geralds, MD 60 mL/hr at 02/28/22 1943 1,000 mL at 02/28/22 1943  ? fentaNYL (SUBLIMAZE) bolus via infusion 50-100 mcg  50-100 mcg Intravenous Q15 min PRN Noemi Chapel P, DO   50 mcg at 03/01/22 0750  ? fentaNYL 2558mg in NS 2537m(1039mml) infusion-PREMIX  50-200 mcg/hr Intravenous Continuous ClaJulian HyO   Stopped at 03/01/22 [id number] fludrocortisone (FLORINEF) tablet 0.1 mg  0.1 mg Per Tube Daily ClaNoemi Chapel DO   0.1 mg at 03/01/22 [id number] folic acid (FOLVITE) tablet 1 mg  1 mg Per Tube Daily ClaNoemi Chapel DO   1 mg at 03/01/22 [id number] heparin injection 1,000-6,000 Units  1,000-6,000 Units CRRT PRN UptMadelon LipsD   3,000 Units at 02/28/22 0930  ? [START ON 03/02/2022] hydrocortisone sodium succinate (SOLU-CORTEF) 100 MG injection 50 mg  50 mg Intravenous Daily Olalere, Adewale A, MD      ? insulin aspart (novoLOG) injection 0-15 Units  0-15 Units Subcutaneous Q4H ClaJulian HyO   2 Units at 03/01/22 [id number] MEDLINE mouth rinse  15 mL Mouth Rinse 10 times per day OlaSherrilyn Rist MD   15 mL at 03/01/22 1117  ? metoprolol  tartrate (LOPRESSOR) injection 5 mg  5 mg Intravenous Q5 min PRN Godfrey Pick, MD      ? norepinephrine (LEVOPHED) 16 mg in 244m premix infusion  0-40 mcg/min Intravenous Titrated OLaurin Coder MD   Stopped at 03/01/22 1122  ? oxyCODONE (Oxy IR/ROXICODONE) immediate release tablet 5 mg  5 mg Per Tube Q4H Olalere, Adewale A, MD   5 mg at 03/01/22 1113  ? pantoprazole sodium (PROTONIX) 40 mg/20 mL oral suspension 40 mg  40 mg Per Tube Daily BHenri Medal RPH      ? piperacillin-tazobactam (ZOSYN) IVPB 3.375 g  3.375 g Intravenous Q6H CJulian Hy DO   Stopped at 03/01/22 1146  ? polyethylene glycol (MIRALAX / GLYCOLAX) packet 17 g  17 g Oral Daily PRN GEstill Cotta NP      ? prismasol BGK 2/2.5 dialysis solution   CRRT Continuous UMadelon Lips MD 2,000 mL/hr at 03/01/22 1138 New Bag at 03/01/22  1138  ? prismasol BGK 2/2.5 replacement solution   CRRT Continuous UMadelon Lips MD 500 mL/hr at 03/01/22 0912 New Bag at 03/01/22 0912  ? prismasol BGK 2/2.5 replacement solution   CRRT Continuous UMadelon Lips MD 400 mL/hr at 03/01/22 0127 New Bag at 03/01/22 0127  ? sodium chloride flush (NS) 0.9 % injection 10-40 mL  10-40 mL Intracatheter Q12H Olalere, Adewale A, MD   10 mL at 03/01/22 0921  ? sodium chloride flush (NS) 0.9 % injection 10-40 mL  10-40 mL Intracatheter PRN Olalere, Adewale A, MD      ? ? ? ?Objective: ?Vital: ?Vitals:  ? 03/01/22 1119 03/01/22 1130 03/01/22 1145 03/01/22 1200  ?BP:      ?Pulse:  91 90 84  ?Resp:  '11 13 15  ' ?Temp: 98.1 ?F (36.7 ?C)     ?TempSrc: Bladder     ?SpO2:  99% 100% 99%  ?Weight:      ?Height:      ? ?I/Os: ?I/O last 3 completed shifts: ?In: 4975.8 [I.V.:2368; NG/GT:2307.8; IV Piggyback:299.9] ?Out: 8518 [Urine:5; OBCWUG:8916 Stool:200] ? ?Physical Exam:  ?General: Patient is intubated and sedated ?Lungs: Normal respiratory effort, chest expands symmetrically. ?GI: The abdomen is soft and nontender without mass. ?Genitourinary: Patient has a large amount of scrotal and moderate penile edema.  There is diffuse scrotal ecchymosis.  Some skin sloughing as well.  There is no crepitus or fluctuance. ?Ext: lower extremities symmetric, diffuse edema ? ?Lab Results: ?Recent Labs  ?  02/27/22 ?0945004/08/23 ?0413 02/28/22 ?1222 03/01/22 ?0308  ?WBC 10.1 8.2  --  10.0  ?HGB 8.9* 8.6* 8.8* 8.2*  ?HCT 27.5* 26.4* 26.0* 25.9*  ? ?Recent Labs  ?  02/28/22 ?0413 02/28/22 ?1222 02/28/22 ?1611 03/01/22 ?0308  ?NA 135  136 134* 135 136  ?K 5.0  5.1 5.4* 5.0 4.3  ?CL 99  100  --  103 102  ?CO2 24  24  --  22 24  ?GLUCOSE 151*  149*  --  148* 143*  ?BUN 67*  67*  --  71* 64*  ?CREATININE 3.22*  3.22*  --  2.93* 2.56*  ?CALCIUM 7.9*  7.9*  --  8.0* 8.0*  ? ?No results for input(s): LABPT, INR in the last 72 hours. ?No results for input(s): LABURIN in the last 72  hours. ?Results for orders placed or performed during the hospital encounter of 02/22/22  ?Resp Panel by RT-PCR (Flu A&B, Covid) Nasopharyngeal Swab     Status: None  ? Collection Time:  02/22/22  5:50 PM  ? Specimen: Nasopharyngeal Swab; Nasopharyngeal(NP) swabs in vial transport medium  ?Result Value Ref Range Status  ? SARS Coronavirus 2 by RT PCR NEGATIVE NEGATIVE Final  ?  Comment: (NOTE) ?SARS-CoV-2 target nucleic acids are NOT DETECTED. ? ?The SARS-CoV-2 RNA is generally detectable in upper respiratory ?specimens during the acute phase of infection. The lowest ?concentration of SARS-CoV-2 viral copies this assay can detect is ?138 copies/mL. A negative result does not preclude SARS-Cov-2 ?infection and should not be used as the sole basis for treatment or ?other patient management decisions. A negative result may occur with  ?improper specimen collection/handling, submission of specimen other ?than nasopharyngeal swab, presence of viral mutation(s) within the ?areas targeted by this assay, and inadequate number of viral ?copies(<138 copies/mL). A negative result must be combined with ?clinical observations, patient history, and epidemiological ?information. The expected result is Negative. ? ?Fact Sheet for Patients:  ?EntrepreneurPulse.com.au ? ?Fact Sheet for Healthcare Providers:  ?IncredibleEmployment.be ? ?This test is no t yet approved or cleared by the [place] FDA and  ?has been authorized for detection and/or diagnosis of SARS-CoV-2 by ?FDA under an Emergency Use Authorization (EUA). This EUA will remain  ?in effect (meaning this test can be used) for the duration of the ?COVID-19 declaration under Section 564(b)(1) of the Act, 21 ?U.S.C.section 360bbb-3(b)(1), unless the authorization is terminated  ?or revoked sooner.  ? ? ?  ? Influenza A by PCR NEGATIVE NEGATIVE Final  ? Influenza B by PCR NEGATIVE NEGATIVE Final  ?  Comment: (NOTE) ?The Xpert Xpress  SARS-CoV-2/FLU/RSV plus assay is intended as an aid ?in the diagnosis of influenza from Nasopharyngeal swab specimens and ?should not be used as a sole basis for treatment. Nasal washings and ?aspirates are unacceptable for Xpert X

## 2022-03-01 NOTE — Progress Notes (Signed)
ANTICOAGULATION CONSULT NOTE ? ?Pharmacy Consult for heparin > bivalirudin ?Indication: atrial fibrillation ? ?Allergies  ?Allergen Reactions  ? Codeine Hives  ?  Insomnia, jittery  ? Flomax [Tamsulosin Hcl] Nausea Only  ?  Nausea and fatigue  ? ? ?Patient Measurements: ?Height: '5\' 10"'$  (177.8 cm) ?Weight: 130.1 kg (286 lb 13.1 oz) ?IBW/kg (Calculated) : 73 ?Heparin Dosing Weight: 95 kg ? ?Vital Signs: ?Temp: 97.6 ?F (36.4 ?C) (04/09 0300) ?Temp Source: Bladder (04/09 0300) ?Pulse Rate: 67 (04/09 0700) ? ?Labs: ?Recent Labs  ?  02/26/22 ?0851 02/26/22 ?1334 02/27/22 ?0317 02/27/22 ?1503 02/28/22 ?0413 02/28/22 ?1222 02/28/22 ?1611 02/28/22 ?2115 03/01/22 ?0308  ?HGB  --    < > 8.9*  --  8.6* 8.8*  --   --  8.2*  ?HCT  --    < > 27.5*  --  26.4* 26.0*  --   --  25.9*  ?PLT 40*  --  47*  --  67*  --   --   --  76*  ?APTT 119*   < > 65*   < > 54*  --  41* 51* 55*  ?LABPROT 15.8*  --   --   --   --   --   --   --   --   ?INR 1.3*  --   --   --   --   --   --   --   --   ?CREATININE  --    < > 5.10*   < > 3.22*  3.22*  --  2.93*  --  2.56*  ? < > = values in this interval not displayed.  ? ? ? ?Estimated Creatinine Clearance: 41.6 mL/min (A) (by C-G formula based on SCr of 2.56 mg/dL (H)). ? ? ?Assessment:  ?61 y.o. male with new onset Afib RVR, likely secondary to sepsis.  Urology seeing for hematuria.  DIC panel on 4/4 showed elevated aPTT at >200 s and elevated INR at 2.2. Stat heparin level ordered afterwards  and resulted subtherapeutic (0.24). Discussed with CCM and this is likely due to sepsis. Repeat DIC panel 4/6 still negative, AT3 ordered and 36 indicating AT3 deficiency. Patient was transitioned from heparin gtt to bivalirudin gtt after dialysis catheter insertion on 4/6.  ? ?aPTT 55 on bivalirudin 0.06 mg/kg/hr is therapeutic. Hgb 8.2. Plt 76 - increase. No bleeding noted per RN.  ? ?Plan: ?Continue bivalirudin 0.06 mg/kg/hr (use total body weight) ?Continue to check aPTT q12h ?Check CBC daily  ?Monitor for  s/s of bleeding  ? ?Cristela Felt, PharmD, BCPS ?Clinical Pharmacist ?03/01/2022 7:16 AM ? ? ? ?

## 2022-03-01 NOTE — Progress Notes (Signed)
ANTICOAGULATION CONSULT NOTE ? ?Pharmacy Consult for heparin > bivalirudin ?Indication: atrial fibrillation ? ?Allergies  ?Allergen Reactions  ? Codeine Hives  ?  Insomnia, jittery  ? Flomax [Tamsulosin Hcl] Nausea Only  ?  Nausea and fatigue  ? ? ?Patient Measurements: ?Height: '5\' 10"'$  (177.8 cm) ?Weight: 130.1 kg (286 lb 13.1 oz) ?IBW/kg (Calculated) : 73 ?Heparin Dosing Weight: 95 kg ? ?Vital Signs: ?Temp: 98.4 ?F (36.9 ?C) (04/09 1524) ?Temp Source: Bladder (04/09 1524) ?BP: 189/74 (04/09 1607) ?Pulse Rate: 96 (04/09 1845) ? ?Labs: ?Recent Labs  ?  02/27/22 ?0317 02/27/22 ?1503 02/28/22 ?0413 02/28/22 ?1222 02/28/22 ?1611 02/28/22 ?2115 03/01/22 ?0308 03/01/22 ?1552 03/01/22 ?1655  ?HGB 8.9*  --  8.6* 8.8*  --   --  8.2*  --   --   ?HCT 27.5*  --  26.4* 26.0*  --   --  25.9*  --   --   ?PLT 47*  --  67*  --   --   --  76*  --   --   ?APTT 65*   < > 54*  --  41* 51* 55*  --  59*  ?CREATININE 5.10*   < > 3.22*  3.22*  --  2.93*  --  2.56* 2.38*  --   ? < > = values in this interval not displayed.  ? ? ? ?Estimated Creatinine Clearance: 44.7 mL/min (A) (by C-G formula based on SCr of 2.38 mg/dL (H)). ? ? ?Assessment:  ?61 y.o. male with new onset Afib RVR, likely secondary to sepsis.  Urology seeing for hematuria.  DIC panel on 4/4 showed elevated aPTT at >200 s and elevated INR at 2.2. Stat heparin level ordered afterwards  and resulted subtherapeutic (0.24). Discussed with CCM and this is likely due to sepsis. Repeat DIC panel 4/6 still negative, AT3 ordered and 36 indicating AT3 deficiency. Patient was transitioned from heparin gtt to bivalirudin gtt after dialysis catheter insertion on 4/6.  ? ?aPTT therapeutic at 59 seconds. ? ?Plan: ?Continue bivalirudin 0.06 mg/kg/hr (use total body weight) ?Continue to check aPTT q12h ?Check CBC daily  ?Monitor for s/s of bleeding  ? ? ?Arrie Senate, PharmD, BCPS, BCCP ?Clinical Pharmacist ?Please check AMION for all [place] numbers ?03/01/2022 ? ? ?

## 2022-03-01 NOTE — Procedures (Signed)
Arterial Catheter Insertion Procedure Note ? ?[name]  ?[id number]  ?March 04, 1961 ? ?Date:03/01/22  ?Time:6:45 PM  ? ? ?Provider Performing: Ned Grace  ? ? ?Procedure: Insertion of Arterial Line [phone]) without US guidance ? ?Indication(s) ?Blood pressure monitoring and/or need for frequent ABGs ? ?Consent ?Risks of the procedure as well as the alternatives and risks of each were explained to the patient and/or caregiver.  Consent for the procedure was obtained and is signed in the bedside chart ? ?Anesthesia ?None ? ? ?Time Out ?Verified patient identification, verified procedure, site/side was marked, verified correct patient position, special equipment/implants available, medications/allergies/relevant history reviewed, required imaging and test results available. ? ? ?Sterile Technique ?Maximal sterile technique including full sterile barrier drape, hand hygiene, sterile gown, sterile gloves, mask, hair covering, sterile ultrasound probe cover (if used). ? ? ?Procedure Description ?Area of catheter insertion was cleaned with chlorhexidine and draped in sterile fashion. With real-time ultrasound guidance an arterial catheter was placed into the left radial artery.  Appropriate arterial tracings confirmed on monitor.   ? ? ?Complications/Tolerance ?None; patient tolerated the procedure well. ? ? ?EBL ?Minimal ? ? ?Specimen(s) ?None ? ?

## 2022-03-01 NOTE — Progress Notes (Signed)
?[facility] ?Progress Note  ? ? ?Assessment/ Plan:   ?AKI: in the setting of severe septic shock- oliguric now- no hard indications for starting RRT today but will need in next 24 hrs ?            - started CRRT 02/26/22 ? - change to 2K bath 02/28/22 with K slightly higher, systemic hep gtt changed to bivalirudin ? - repleting Ca as needed  ?            - tolerating even- continue UF goal to 100-200 mL/ hr ?            - sending CK in setting of hypocalcemia- 900s ?  ?  ?2.  Septic shock ?            - severe ?            - on pressors, stress dose steroids, IV antibiotics (linezolid and zosyn) methylene blue ?            - likely source is scrotum-- got CT 02/28/22 with increased scrotal edema ?  ?3.  Scrotal infection ?            - urology following, ct as above ?  ?4.  Acute hypoxic RF ?            - intubated per PCCM ? ?5.  Thrombocytopenia/ coagulopathy: abd [place] with cirrhosis, elevated Tbili no schistocytes on smear, likely sepsis related (? Shock liver) ? ?6.  Hypocalcemia: aggressive repletion, now in the low 8s ? ?7.  Afib with RVR- hep--> bivalirudin gtt ?  ?8. Dispo: ICU ? ?Subjective:   ? ?Repeat CT with increased scrotal edema and bilateral hydroceles.  Awake, following commands.  Now hypertensive.  Getting good UF.    ? ?Objective:   ?BP (!) 151/68   Pulse 89   Temp (!) 97.4 ?F (36.3 ?C) (Bladder)   Resp 15   Ht '5\' 10"'  (1.778 m)   Wt 130.1 kg   SpO2 100%   BMI 41.15 kg/m?  ? ?Intake/Output Summary (Last 24 hours) at 03/01/2022 1107 ?Last data filed at 03/01/2022 1000 ?Gross per 24 hour  ?Intake 3514.14 ml  ?Output 6173 ml  ?Net -2658.86 ml  ? ?Weight change: -1 kg ? ?Physical Exam: ?GEN: ill-appearing, awake  ?HEENT eyes opened, + icteric  ?PULM coarse bilaterally, intubated ?CV tachycardic ?ABD distended, still soft, hypoactive bowel sounds ?GU: swollen scrotum with deeply violaceous discoloration with some weeping ?EXT 2 + anasarca, mottled and cool bilateral soles of feet and toes, +  jaundiced ?NEURO intubated  ? ?Imaging: ?CT ABDOMEN PELVIS WO CONTRAST ? ?Result Date: 02/28/2022 ?CLINICAL DATA:  Scrotal mass or lump EXAM: CT ABDOMEN AND PELVIS WITHOUT CONTRAST TECHNIQUE: Multidetector CT imaging of the abdomen and pelvis was performed following the standard protocol without IV contrast. RADIATION DOSE REDUCTION: This exam was performed according to the departmental dose-optimization program which includes automated exposure control, adjustment of the mA and/or kV according to patient size and/or use of iterative reconstruction technique. COMPARISON:  CT pelvis, 02/24/2022, CT abdomen pelvis, 12/25/2021 FINDINGS: Lower chest: Cardiomegaly. Left and right coronary artery calcifications. Dependent bibasilar atelectasis or consolidation. Hepatobiliary: No solid liver abnormality is seen. Contracted gallbladder. No gallstones, gallbladder wall thickening, or biliary dilatation. Pancreas: Unremarkable. No pancreatic ductal dilatation or surrounding inflammatory changes. Spleen: Splenomegaly, maximum coronal span 18.6 cm. Adrenals/Urinary Tract: Adrenal glands are unremarkable. Multiple small bilateral nonobstructive renal calculi. Lobulated, scarred appearance of the  kidneys, with multiple low-attenuation lesions, incompletely characterized by noncontrast examination although most likely small cysts. Foley catheter within the urinary bladder. Stomach/Bowel: Stomach is within normal limits. Appendix appears normal. No evidence of bowel wall thickening, distention, or inflammatory changes. Vascular/Lymphatic: Aortic atherosclerosis. No enlarged abdominal or pelvic lymph nodes. Reproductive: No mass. Severe scrotal edema and bilateral hydroceles, increased compared to prior examination. Other: Anasarca.  Small volume ascites in the low abdomen. Musculoskeletal: No acute or significant osseous findings. IMPRESSION: 1. Severe scrotal edema and bilateral hydroceles, increased compared to prior examination.  2. Ascites and anasarca. 3. Multiple small bilateral nonobstructive renal calculi. No hydronephrosis. 4. Splenomegaly. 5. Cardiomegaly and coronary artery disease. Aortic Atherosclerosis (ICD10-I70.0). Electronically Signed   By: Delanna Ahmadi M.D.   On: 02/28/2022 10:17   ? ?Labs: ?BMET ?Recent Labs  ?Lab 02/26/22 ?0427 02/26/22 ?1600 02/27/22 ?0317 02/27/22 ?1503 02/28/22 ?0413 02/28/22 ?1222 02/28/22 ?1611 03/01/22 ?0308  ?NA 134* 133* 134* 134* 135  136 134* 135 136  ?K 3.9 5.5* 4.7 5.3* 5.0  5.1 5.4* 5.0 4.3  ?CL 92* 91* 97* 97* 99  100  --  103 102  ?CO2 '22 23 24 24 24  24  ' --  22 24  ?GLUCOSE 160* 166* 158* 148* 151*  149*  --  148* 143*  ?BUN 98* 110* 87* 74* 67*  67*  --  71* 64*  ?CREATININE 6.65* 6.87* 5.10* 3.91* 3.22*  3.22*  --  2.93* 2.56*  ?CALCIUM 6.2* 6.5* 7.0* 7.3* 7.9*  7.9*  --  8.0* 8.0*  ?PHOS 5.4* 7.1* 5.7* 5.9* 5.6*  5.6*  --  6.8* 5.5*  ? ?CBC ?Recent Labs  ?Lab 02/22/22 ?1805 02/22/22 ?1819 02/26/22 ?8144 02/26/22 ?8185 02/27/22 ?6314 02/28/22 ?0413 02/28/22 ?1222 03/01/22 ?0308  ?WBC 2.3*   < > 15.8*  --  10.1 8.2  --  10.0  ?NEUTROABS 2.2  --   --   --   --   --   --   --   ?HGB 13.1   < > 9.4*  --  8.9* 8.6* 8.8* 8.2*  ?HCT 40.6   < > 27.9*  --  27.5* 26.4* 26.0* 25.9*  ?MCV 92.5   < > 89.1  --  90.8 92.6  --  92.5  ?PLT 193   < > 43* 40* 47* 67*  --  76*  ? < > = values in this interval not displayed.  ? ? ?Medications:   ? ? vitamin C  250 mg Per Tube BID  ? B-complex with vitamin C  1 tablet Per Tube Daily  ? chlorhexidine gluconate (MEDLINE KIT)  15 mL Mouth Rinse BID  ? Chlorhexidine Gluconate Cloth  6 each Topical Q0600  ? feeding supplement (PROSource TF)  90 mL Per Tube TID  ? fludrocortisone  0.1 mg Per Tube Daily  ? folic acid  1 mg Per Tube Daily  ? hydrocortisone sod succinate (SOLU-CORTEF) inj  50 mg Intravenous Q12H  ? insulin aspart  0-15 Units Subcutaneous Q4H  ? mouth rinse  15 mL Mouth Rinse 10 times per day  ? oxyCODONE  5 mg Per Tube Q4H  ? pantoprazole  (PROTONIX) IV  40 mg Intravenous QHS  ? sodium chloride flush  10-40 mL Intracatheter Q12H  ? ? ? ? ?Madelon Lips MD ?03/01/2022, 11:07 AM   ?

## 2022-03-01 NOTE — Progress Notes (Signed)
? ?NAME:  Jeffrey Ayala, MRN:  [id number], DOB:  July 05, 1961, LOS: 7 ?ADMISSION DATE:  02/22/2022, CONSULTATION DATE:  4/2 ?REFERRING MD:  Doren Custard- ED CHIEF COMPLAINT: Septic shock ? ?History of Present Illness:  ?Jeffrey Ayala, is a 61 y.o. male, who presented to the [facility] ED with a chief complaint of nausea, vomiting, diarrhea. ? ?They have a pertinent past medical history of gastric ulcer, kidney stones, eosinophilic esophagitis, migraines  ? ?History obtained from [name] (spouse), on 4/1 patient began complaining of feeling unwell. Wife denies sick contacts, questionable food.  Wife denies taking any meds currently.  Around 2 PM on 4/2 the patient developed nausea, vomiting, and diarrhea while at a birthday party and soiled himself. EMS was called. When EMS arrived he was found to be in A-fib with a rate of 120-160. ? ?At the ED he was found to be febrile with a Tmax of 102.9.  A code sepsis was called in the emergency department, blood cultures were drawn, 2 L of IV fluid were given, vanc and cefepime were ordered.  CT chest negative for acute process.  CT abdomen pelvis with and without contrast notable for swollen scrotum concerning for infection, colon filled with diarrhea.  Urology and cardiology were consulted by the ED.  Despite fluid resuscitation the patient developed hypotension requiring vasopressors. ? ?PCCM was consulted for admission. ? ?Pertinent  Medical History  ?gastric ulcer, kidney stones, eosinophilic esophagitis, migraines  ? ?Significant Hospital Events: ?Including procedures, antibiotic start and stop dates in addition to other pertinent events   ?4/2 Presented to the ED. CT chest negative for acute process.  CT abdomen pelvis with and without contrast notable for swollen scrotum concerning for infection, colon filled with diarrhea.  Urology and cardiology were consulted by the ED. Vanc, cefepime started.  ?4/3 Intubated overnight, brief cardiac arrest. Escalating pressors. ?4/5  started on methylene blue ?4/6 started on CRRT ?4/8 appears more responsive ?4/9 responsive ? ? ?Interim History / Subjective:  ?Overnight no issues.   ?Pressor requirements improving ?Able to follow simple commands-squeezing hands ? ?Objective   ?Blood pressure (!) 118/45, pulse 92, temperature 98.1 ?F (36.7 ?C), temperature source Bladder, resp. rate (!) 23, height '5\' 10"'$  (1.778 m), weight 130.1 kg, SpO2 99 %.  ?   ?Vent Mode: PRVC ?FiO2 (%):  [40 %] 40 % ?Set Rate:  [22 bmp] 22 bmp ?Vt Set:  [600 mL] 600 mL ?PEEP:  [5 cmH20] 5 cmH20 ?Pressure Support:  [8 cmH20] 8 cmH20 ?Plateau Pressure:  [19 cmH20-23 cmH20] 19 cmH20  ? ?Intake/Output Summary (Last 24 hours) at 03/01/2022 1456 ?Last data filed at 03/01/2022 1404 ?Gross per 24 hour  ?Intake 3757.59 ml  ?Output 7557 ml  ?Net -3799.41 ml  ? ?Filed Weights  ? 02/27/22 0414 02/28/22 0500 03/01/22 0315  ?Weight: 135 kg 131.1 kg 130.1 kg  ? ? ?Examination: ?Gen:   Acutely ill-appearing, jaundiced ?HEENT: Endotracheal tube in place, moist oral mucosa ?Lungs:    Clear breath sounds anteriorly ?CV:         S1-S2 appreciated ?Abd:      Soft, distended, significant scrotal edema next creation, some sloughing of the skin  ?Ext:    Extremity edema ?Skin:      Mottled skin both feet ?neuro:   sedated, RASS-1 ? ? ?Blood culture-no growth to date ? ?Labs/blood work reviewed ?-BUN 67/creatinine 3.22 ? ?Assessment & Plan:  ? ?Septic shock ?-On Zosyn ?-Levophed today ?-Decreasing doses of steroids, Hydrocort 50 daily  and fludrocortisone ? ?Scrotal edema ?-Repeat CT scan remained stable, no signs of gas, still edematous ?-Appreciate urology follow-up ? ?Atrial fibrillation with RVR ?-Likely secondary to sepsis ?-Rate controlled ?-On be bowel routine ? ?Acute hypoxemic respiratory failure require mechanical ventilation ?-He continues to wean ?-Mental status still precludes extubation at present however he continues to stabilize ? ?Acute kidney injury ?-Appreciate renal input ?-He is on  CRRT ?-Continue volume removal ? ?Shock liver ?--CT scan of the abdomen yesterday did not show any significant liver abnormality, no gallbladder wall thickening or biliary dilatation, no significant abnormality in the pancreas, splenomegaly is chronic ? ?Metabolic encephalopathy ?-Was able to wean off fentanyl today ?-Remains on Precedex, off ketamine ? ?Hyperglycemia ?-Continue SSI ? ?Leukocytosis ?-Continue to monitor ?-Cultures have been negative ? ?Gastric ulcer ?-Continue PPI ? ?At risk for malnutrition ?-Continue tube feeds ? ?Updated spouse at bedside ? ?Metabolic encephalopathy ?-On ketamine, fentanyl ? ?Hyperglycemia ?-Continue SSI ? ?Leukocytosis ?-Continue to monitor ?-Cultures have been negative ? ?Gastric ulcer ?-Continue PPI ? ?Splenomegaly on previous CT ?-Continue follow-up ? ?At risk for malnutrition ?-Continue tube feeds ? ?Spouse and son updated at bedside ? ?Best Practice (right click and "Reselect all SmartList Selections" daily)  ? ?Diet/type: tubefeeds ?DVT prophylaxis: on bivalirudin due to unable to achieve therapeutic dosing on heparin and high risk for clotting.  ?GI prophylaxis: PPI ?Lines: Central line, Arterial Line, and yes and it is still needed ?Foley:  Yes, and it is still needed ?Code Status:  full code ?Last date of multidisciplinary goals of care discussion [4/2 Discussed with wife [name] at bedside.  Full scope of care.  Once CPR and intubation if needed] ? ?Labs   ?CBC: ?Recent Labs  ?Lab 02/22/22 ?1805 02/22/22 ?1819 02/25/22 ?5809 02/26/22 ?9833 02/26/22 ?8250 02/27/22 ?5397 02/28/22 ?6734 02/28/22 ?1222 03/01/22 ?0308  ?WBC 2.3*   < > 21.9* 15.8*  --  10.1 8.2  --  10.0  ?NEUTROABS 2.2  --   --   --   --   --   --   --   --   ?HGB 13.1   < > 10.0* 9.4*  --  8.9* 8.6* 8.8* 8.2*  ?HCT 40.6   < > 30.6* 27.9*  --  27.5* 26.4* 26.0* 25.9*  ?MCV 92.5   < > 91.6 89.1  --  90.8 92.6  --  92.5  ?PLT 193   < > 63* 43* 40* 47* 67*  --  76*  ? < > = values in this interval not  displayed.  ? ? ?Basic Metabolic Panel: ?Recent Labs  ?Lab 02/25/22 ?0434 02/25/22 ?0645 02/26/22 ?0427 02/26/22 ?1600 02/27/22 ?0317 02/27/22 ?1503 02/28/22 ?0413 02/28/22 ?1222 02/28/22 ?1611 03/01/22 ?0308  ?NA  --    < > 134*   < > 134* 134* 135  136 134* 135 136  ?K  --    < > 3.9   < > 4.7 5.3* 5.0  5.1 5.4* 5.0 4.3  ?CL  --    < > 92*   < > 97* 97* 99  100  --  103 102  ?CO2  --    < > 22   < > '24 24 24  24  '$ --  22 24  ?GLUCOSE  --    < > 160*   < > 158* 148* 151*  149*  --  148* 143*  ?BUN  --    < > 98*   < > 87* 74* 67*  67*  --  71* 64*  ?CREATININE  --    < > 6.65*   < > 5.10* 3.91* 3.22*  3.22*  --  2.93* 2.56*  ?CALCIUM  --    < > 6.2*   < > 7.0* 7.3* 7.9*  7.9*  --  8.0* 8.0*  ?MG 2.0  --  2.2  --  2.4  --  2.8*  --   --  2.7*  ?PHOS 4.8*  --  5.4*   < > 5.7* 5.9* 5.6*  5.6*  --  6.8* 5.5*  ? < > = values in this interval not displayed.  ? ?GFR: ?Estimated Creatinine Clearance: 41.6 mL/min (A) (by C-G formula based on SCr of 2.56 mg/dL (H)). ?Recent Labs  ?Lab 02/23/22 ?1200 02/23/22 ?1846 02/24/22 ?4481 02/24/22 ?8563 02/24/22 ?1601 02/25/22 ?1497 02/26/22 ?0263 02/27/22 ?7858 02/28/22 ?8502 03/01/22 ?0308  ?WBC  --   --   --    < >  --    < > 15.8* 10.1 8.2 10.0  ?LATICACIDVEN 7.7* 3.9* 3.6*  --  3.9*  --   --   --   --   --   ? < > = values in this interval not displayed.  ? ? ?Liver Function Tests: ?Recent Labs  ?Lab 02/24/22 ?7741 02/25/22 ?0434 02/26/22 ?0427 02/26/22 ?1600 02/27/22 ?0317 02/27/22 ?1503 02/28/22 ?0413 02/28/22 ?1611 03/01/22 ?0308  ?AST 88* 87* 85*  --  97*  --   --   --  143*  ?ALT 53* 54* 49*  --  62*  --   --   --  118*  ?ALKPHOS 68 96 152*  --  202*  --   --   --  465*  ?BILITOT 7.7* 11.8* 13.9*  --  15.4*  --   --   --  17.9*  ?PROT 4.9* 4.6* 4.9*  --  5.1*  --   --   --  5.6*  ?ALBUMIN 2.6* 2.3* 2.2*   < > 2.1* 2.0* 2.1* 2.0* 2.0*  2.0*  ? < > = values in this interval not displayed.  ? ?No results for input(s): LIPASE, AMYLASE in the last 168 hours. ?Recent Labs   ?Lab 03/01/22 ?0308  ?AMMONIA 45*  ? ? ?ABG ?   ?Component Value Date/Time  ? PHART 7.289 (L) 02/28/2022 1222  ? PCO2ART 60.1 (H) 02/28/2022 1222  ? PO2ART 326 (H) 02/28/2022 1222  ? HCO3 28.9 (H) 02/28/2022 1222  ? T

## 2022-03-02 DIAGNOSIS — R6521 Severe sepsis with septic shock: Secondary | ICD-10-CM | POA: Diagnosis not present

## 2022-03-02 DIAGNOSIS — A419 Sepsis, unspecified organism: Secondary | ICD-10-CM | POA: Diagnosis not present

## 2022-03-02 DIAGNOSIS — J9601 Acute respiratory failure with hypoxia: Secondary | ICD-10-CM | POA: Diagnosis not present

## 2022-03-02 LAB — GLUCOSE, CAPILLARY
Glucose-Capillary: 126 mg/dL — ABNORMAL HIGH (ref 70–99)
Glucose-Capillary: 102 mg/dL — ABNORMAL HIGH (ref 70–99)
Glucose-Capillary: 70 mg/dL (ref 70–99)
Glucose-Capillary: 55 mg/dL — ABNORMAL LOW (ref 70–99)
Glucose-Capillary: 55 mg/dL — ABNORMAL LOW (ref 70–99)
Glucose-Capillary: 123 mg/dL — ABNORMAL HIGH (ref 70–99)
Glucose-Capillary: 115 mg/dL — ABNORMAL HIGH (ref 70–99)
Glucose-Capillary: 103 mg/dL — ABNORMAL HIGH (ref 70–99)

## 2022-03-02 LAB — POCT I-STAT 7, (LYTES, BLD GAS, ICA,H+H)
Acid-Base Excess: 1 mmol/L (ref 0.0–2.0)
Bicarbonate: 26.7 mmol/L (ref 20.0–28.0)
Calcium, Ion: 1.17 mmol/L (ref 1.15–1.40)
HCT: 27 % — ABNORMAL LOW (ref 39.0–52.0)
Hemoglobin: 9.2 g/dL — ABNORMAL LOW (ref 13.0–17.0)
O2 Saturation: 100 %
Patient temperature: 98.6
Potassium: 4 mmol/L (ref 3.5–5.1)
Sodium: 136 mmol/L (ref 135–145)
TCO2: 28 mmol/L (ref 22–32)
pCO2 arterial: 45.2 mmHg (ref 32–48)
pH, Arterial: 7.38 (ref 7.35–7.45)
pO2, Arterial: 211 mmHg — ABNORMAL HIGH (ref 83–108)

## 2022-03-02 LAB — CULTURE, RESPIRATORY W GRAM STAIN

## 2022-03-02 LAB — COMPREHENSIVE METABOLIC PANEL
ALT: 141 U/L — ABNORMAL HIGH (ref 0–44)
AST: 123 U/L — ABNORMAL HIGH (ref 15–41)
Albumin: 2.2 g/dL — ABNORMAL LOW (ref 3.5–5.0)
Alkaline Phosphatase: 564 U/L — ABNORMAL HIGH (ref 38–126)
Anion gap: 12 (ref 5–15)
BUN: 53 mg/dL — ABNORMAL HIGH (ref 6–20)
CO2: 23 mmol/L (ref 22–32)
Calcium: 8.2 mg/dL — ABNORMAL LOW (ref 8.9–10.3)
Chloride: 100 mmol/L (ref 98–111)
Creatinine, Ser: 2.28 mg/dL — ABNORMAL HIGH (ref 0.61–1.24)
GFR, Estimated: 32 mL/min — ABNORMAL LOW (ref >60)
Glucose, Bld: 127 mg/dL — ABNORMAL HIGH (ref 70–99)
Potassium: 3.8 mmol/L (ref 3.5–5.1)
Sodium: 135 mmol/L (ref 135–145)
Total Bilirubin: 11.6 mg/dL — ABNORMAL HIGH (ref 0.3–1.2)
Total Protein: 6.1 g/dL — ABNORMAL LOW (ref 6.5–8.1)

## 2022-03-02 LAB — CBC
HCT: 26.3 % — ABNORMAL LOW (ref 39.0–52.0)
Hemoglobin: 8.6 g/dL — ABNORMAL LOW (ref 13.0–17.0)
MCH: 29.9 pg (ref 26.0–34.0)
MCHC: 32.7 g/dL (ref 30.0–36.0)
MCV: 91.3 fL (ref 80.0–100.0)
Platelets: 90 10*3/uL — ABNORMAL LOW (ref 150–400)
RBC: 2.88 MIL/uL — ABNORMAL LOW (ref 4.22–5.81)
RDW: 16.4 % — ABNORMAL HIGH (ref 11.5–15.5)
WBC: 10.4 10*3/uL (ref 4.0–10.5)
nRBC: 0.2 % (ref 0.0–0.2)

## 2022-03-02 LAB — APTT
aPTT: 63 seconds — ABNORMAL HIGH (ref 24–36)
aPTT: 65 seconds — ABNORMAL HIGH (ref 24–36)

## 2022-03-02 LAB — RENAL FUNCTION PANEL
Albumin: 2.2 g/dL — ABNORMAL LOW (ref 3.5–5.0)
Anion gap: 11 (ref 5–15)
BUN: 49 mg/dL — ABNORMAL HIGH (ref 6–20)
CO2: 23 mmol/L (ref 22–32)
Calcium: 8.1 mg/dL — ABNORMAL LOW (ref 8.9–10.3)
Chloride: 100 mmol/L (ref 98–111)
Creatinine, Ser: 2.2 mg/dL — ABNORMAL HIGH (ref 0.61–1.24)
GFR, Estimated: 33 mL/min — ABNORMAL LOW (ref >60)
Glucose, Bld: 125 mg/dL — ABNORMAL HIGH (ref 70–99)
Phosphorus: 4 mg/dL (ref 2.5–4.6)
Potassium: 3.8 mmol/L (ref 3.5–5.1)
Sodium: 134 mmol/L — ABNORMAL LOW (ref 135–145)

## 2022-03-02 LAB — PHOSPHORUS: Phosphorus: 5.1 mg/dL — ABNORMAL HIGH (ref 2.5–4.6)

## 2022-03-02 LAB — MAGNESIUM: Magnesium: 2.9 mg/dL — ABNORMAL HIGH (ref 1.7–2.4)

## 2022-03-02 MED ORDER — OXYCODONE HCL 5 MG PO TABS
5.0000 mg | ORAL_TABLET | ORAL | Status: DC
  Administered 2022-03-02 – 2022-03-03 (×5): 5 mg via ORAL
  Filled 2022-03-02 (×5): qty 1

## 2022-03-02 MED ORDER — PHENOL 1.4 % MT LIQD
1.0000 | OROMUCOSAL | Status: DC | PRN
  Administered 2022-03-02: 1 via OROMUCOSAL
  Filled 2022-03-02: qty 177

## 2022-03-02 MED ORDER — ORAL CARE MOUTH RINSE
15.0000 mL | Freq: Two times a day (BID) | OROMUCOSAL | Status: DC
  Administered 2022-03-02 – 2022-03-16 (×27): 15 mL via OROMUCOSAL

## 2022-03-02 MED ORDER — AMLODIPINE BESYLATE 10 MG PO TABS
10.0000 mg | ORAL_TABLET | Freq: Every day | ORAL | Status: DC
  Administered 2022-03-02 – 2022-03-06 (×3): 10 mg via ORAL
  Filled 2022-03-02 (×3): qty 1

## 2022-03-02 MED ORDER — WHITE PETROLATUM EX OINT
TOPICAL_OINTMENT | CUTANEOUS | Status: DC | PRN
  Filled 2022-03-02 (×2): qty 28.35

## 2022-03-02 MED ORDER — GUAIFENESIN ER 600 MG PO TB12
600.0000 mg | ORAL_TABLET | Freq: Two times a day (BID) | ORAL | Status: DC
  Administered 2022-03-02 – 2022-03-15 (×27): 600 mg via ORAL
  Filled 2022-03-02 (×28): qty 1

## 2022-03-02 MED ORDER — FENTANYL CITRATE (PF) 100 MCG/2ML IJ SOLN
25.0000 ug | INTRAMUSCULAR | Status: DC | PRN
  Administered 2022-03-02 – 2022-03-03 (×2): 25 ug via INTRAVENOUS
  Filled 2022-03-02 (×2): qty 2

## 2022-03-02 MED ORDER — FOLIC ACID 1 MG PO TABS
1.0000 mg | ORAL_TABLET | Freq: Every day | ORAL | Status: DC
  Administered 2022-03-03 – 2022-03-14 (×12): 1 mg via ORAL
  Filled 2022-03-02 (×12): qty 1

## 2022-03-02 MED ORDER — ACETAMINOPHEN 325 MG PO TABS
650.0000 mg | ORAL_TABLET | Freq: Four times a day (QID) | ORAL | Status: DC | PRN
  Administered 2022-03-02 – 2022-03-09 (×7): 650 mg via ORAL
  Filled 2022-03-02 (×8): qty 2

## 2022-03-02 NOTE — Progress Notes (Signed)
?[facility] ?Progress Note  ? ? ?Assessment/ Plan:   ?AKI: in the setting of severe septic shock likely ATN.  Normal baseline ?            - started CRRT 02/26/22 ? - change to 2K bath 02/28/22 with K slightly higher, systemic hep gtt changed to bivalirudin ?            -Fairly negative over the past 24 hours.  Decrease goal to around 2 to 3 L negative. ?  ?  ?2.  Septic shock ?            - severe ?            - on and off pressors ? - abx per primary ?            - likely source is scrotum-- got CT 02/28/22 with increased scrotal edema ?  ?3.  Scrotal infection ?            - urology following ?  ?4.  Acute hypoxic RF ?            - intubated per PCCM ? - hoping to extubate soon ? ?5.  Thrombocytopenia/ coagulopathy: abd [place] with cirrhosis, elevated Tbili no schistocytes on smear, likely sepsis related (? Shock liver) ? ?6.  Hypocalcemia: aggressive repletion, corrects to normal when accounting for albumin ? ?7.  Afib with RVR- hep--> bivalirudin gtt ?  ?8. Dispo: ICU ? ?Subjective:   ? ?No urine output over the past 24 hours.  -4.7 L on CRRT.  Norepinephrine on and off.  Possibly extubating today  ? ?Objective:   ?BP (!) 166/58   Pulse 100   Temp 98.2 ?F (36.8 ?C) (Oral)   Resp 19   Ht '5\' 10"'  (1.778 m)   Wt 128.7 kg   SpO2 100%   BMI 40.71 kg/m?  ? ?Intake/Output Summary (Last 24 hours) at 03/02/2022 0938 ?Last data filed at 03/02/2022 0900 ?Gross per 24 hour  ?Intake 3566.95 ml  ?Output 8744 ml  ?Net -5177.05 ml  ? ?Weight change: -1.4 kg ? ?Physical Exam: ?GEN: ill-awake, minimally interactive ?HEENT eyes opened, + icteric  ?PULM coarse bilaterally, intubated ?CV tachycardic, no rub ?ABD distended, still soft, bowel sounds present ?GU: swollen scrotum with deeply violaceous discoloration with some weeping ?EXT 2 + anasarca, mottled and cool bilateral soles of feet and toes, + jaundiced ?NEURO intubated, minimally interactive, does not respond to questioning ? ?Imaging: ?CT ABDOMEN PELVIS WO  CONTRAST ? ?Result Date: 02/28/2022 ?CLINICAL DATA:  Scrotal mass or lump EXAM: CT ABDOMEN AND PELVIS WITHOUT CONTRAST TECHNIQUE: Multidetector CT imaging of the abdomen and pelvis was performed following the standard protocol without IV contrast. RADIATION DOSE REDUCTION: This exam was performed according to the departmental dose-optimization program which includes automated exposure control, adjustment of the mA and/or kV according to patient size and/or use of iterative reconstruction technique. COMPARISON:  CT pelvis, 02/24/2022, CT abdomen pelvis, 12/25/2021 FINDINGS: Lower chest: Cardiomegaly. Left and right coronary artery calcifications. Dependent bibasilar atelectasis or consolidation. Hepatobiliary: No solid liver abnormality is seen. Contracted gallbladder. No gallstones, gallbladder wall thickening, or biliary dilatation. Pancreas: Unremarkable. No pancreatic ductal dilatation or surrounding inflammatory changes. Spleen: Splenomegaly, maximum coronal span 18.6 cm. Adrenals/Urinary Tract: Adrenal glands are unremarkable. Multiple small bilateral nonobstructive renal calculi. Lobulated, scarred appearance of the kidneys, with multiple low-attenuation lesions, incompletely characterized by noncontrast examination although most likely small cysts. Foley catheter within the urinary bladder. Stomach/Bowel:  Stomach is within normal limits. Appendix appears normal. No evidence of bowel wall thickening, distention, or inflammatory changes. Vascular/Lymphatic: Aortic atherosclerosis. No enlarged abdominal or pelvic lymph nodes. Reproductive: No mass. Severe scrotal edema and bilateral hydroceles, increased compared to prior examination. Other: Anasarca.  Small volume ascites in the low abdomen. Musculoskeletal: No acute or significant osseous findings. IMPRESSION: 1. Severe scrotal edema and bilateral hydroceles, increased compared to prior examination. 2. Ascites and anasarca. 3. Multiple small bilateral  nonobstructive renal calculi. No hydronephrosis. 4. Splenomegaly. 5. Cardiomegaly and coronary artery disease. Aortic Atherosclerosis (ICD10-I70.0). Electronically Signed   By: Delanna Ahmadi M.D.   On: 02/28/2022 10:17   ? ?Labs: ?BMET ?Recent Labs  ?Lab 02/27/22 ?0317 02/27/22 ?1503 02/28/22 ?0413 02/28/22 ?1222 02/28/22 ?1611 03/01/22 ?0308 03/01/22 ?1552 03/02/22 ?0134 03/02/22 ?0305  ?NA 134* 134* 135  136 134* 135 136 136 136 135  ?K 4.7 5.3* 5.0  5.1 5.4* 5.0 4.3 4.6 4.0 3.8  ?CL 97* 97* 99  100  --  103 102 102  --  100  ?CO2 '24 24 24  24  ' --  '22 24 23  ' --  23  ?GLUCOSE 158* 148* 151*  149*  --  148* 143* 119*  --  127*  ?BUN 87* 74* 67*  67*  --  71* 64* 60*  --  53*  ?CREATININE 5.10* 3.91* 3.22*  3.22*  --  2.93* 2.56* 2.38*  --  2.28*  ?CALCIUM 7.0* 7.3* 7.9*  7.9*  --  8.0* 8.0* 8.0*  --  8.2*  ?PHOS 5.7* 5.9* 5.6*  5.6*  --  6.8* 5.5* 5.0*  --  5.1*  ? ?CBC ?Recent Labs  ?Lab 02/27/22 ?0317 02/28/22 ?0413 02/28/22 ?1222 03/01/22 ?0308 03/02/22 ?0134 03/02/22 ?1308  ?WBC 10.1 8.2  --  10.0  --  10.4  ?HGB 8.9* 8.6* 8.8* 8.2* 9.2* 8.6*  ?HCT 27.5* 26.4* 26.0* 25.9* 27.0* 26.3*  ?MCV 90.8 92.6  --  92.5  --  91.3  ?PLT 47* 67*  --  76*  --  90*  ? ? ?Medications:   ? ? chlorhexidine gluconate (MEDLINE KIT)  15 mL Mouth Rinse BID  ? Chlorhexidine Gluconate Cloth  6 each Topical Q0600  ? [START ON 6/57/8469] folic acid  1 mg Oral Daily  ? insulin aspart  0-15 Units Subcutaneous Q4H  ? mouth rinse  15 mL Mouth Rinse BID  ? oxyCODONE  5 mg Oral Q4H  ? sodium chloride flush  10-40 mL Intracatheter Q12H  ? ? ? ?[name] ? ?03/02/2022, 9:38 AM   ?

## 2022-03-02 NOTE — TOC Initial Note (Signed)
Transition of Care (TOC) - Initial/Assessment Note  ? ? ?Patient Details  ?Name: Jeffrey Ayala ?MRN: [id number] ?Date of Birth: 04-04-1961 ? ?Transition of Care (TOC) CM/SW Contact:    ?Tom-Johnson, Renea Ee, RN ?Phone Number: ?03/02/2022, 4:35 PM ? ?Clinical Narrative:                 ? ?Patient is admitted for Septic shock. From home with wife. Extubated today. Continues CRRT. Awaiting PT eval for disposition. CM will continue to follow with needs.  ?  ?Barriers to Discharge: Continued Medical Work up ? ? ?Patient Goals and CMS Choice ?Patient states their goals for this hospitalization and ongoing recovery are:: To return home ?CMS Medicare.gov Compare Post Acute Care list provided to:: Patient ?  ? ?Expected Discharge Plan and Services ?  ?  ?Discharge Planning Services: CM Consult ?  ?Living arrangements for the past 2 months: [place] ?                ?  ?  ?  ?  ?  ?  ?  ?  ?  ?  ? ?Prior Living Arrangements/Services ?Living arrangements for the past 2 months: [place] ?Lives with:: Spouse ?Patient language and need for interpreter reviewed:: Yes ?Do you feel safe going back to the place where you live?: Yes      ?Need for Family Participation in Patient Care: Yes (Comment) ?Care giver support system in place?: Yes (comment) ?  ?Criminal Activity/Legal Involvement Pertinent to Current Situation/Hospitalization: No - Comment as needed ? ?Activities of Daily Living ?  ?  ? ?Permission Sought/Granted ?Permission sought to share information with : Case Manager, Family Supports ?Permission granted to share information with : Yes, Verbal Permission Granted ?   ?   ?   ?   ? ?Emotional Assessment ?Appearance:: Appears stated age ?Attitude/Demeanor/Rapport: Engaged, Gracious ?Affect (typically observed): Accepting, Appropriate, Calm, Hopeful ?Orientation: : Oriented to Self, Oriented to Place, Oriented to  Time, Oriented to Situation ?Alcohol / Substance Use: Not Applicable ?Psych  Involvement: No (comment) ? ?Admission diagnosis:  Septic shock ([place]) [A41.9, R65.21] ?Patient Active Problem List  ? Diagnosis Date Noted  ? Encounter for central line placement   ? Pressure injury of skin 02/23/2022  ? Septic shock ([place]) 02/22/2022  ? AKI (acute kidney injury) ([place])   ? Generalized abdominal pain   ? Lactic acidosis   ? Atrial fibrillation ([place])   ? Hypokalemia   ? Hypomagnesemia   ? S/P TKR (total knee replacement), bilateral 11/04/2018  ? Bilateral primary osteoarthritis of knee   ? Rupture of medial head of gastrocnemius, left, sequela 01/14/2017  ? Kidney stones 06/22/2016  ? HYPERTENSION 08/01/2010  ? DYSTHYMIC DISORDER 07/08/2010  ? PALPITATIONS 07/08/2010  ? ?PCP:  Patient, No Pcp Per (Inactive) ?Pharmacy:   ?[id number] [id number]?Phone: [phone]: [phone]? ? ? ? ?Social Determinants of Health (SDOH) Interventions ?  ? ?Readmission Risk Interventions ?   ? View : No data to display.  ?  ?  ?  ? ? ? ?

## 2022-03-02 NOTE — Procedures (Signed)
Extubation Procedure Note  Patient Details:   Name: Jeffrey Ayala DOB: [birth year] MRN: [id number]   Airway Documentation:    Vent end date: 03/02/22 Vent end time: 0855   Evaluation  O2 sats: stable throughout Complications: No apparent complications Patient did tolerate procedure well. Bilateral Breath Sounds: Clear, Diminished   Yes  Pt extubated per Md order. Positive cuff leak, Pt able to state name. No stridor noted. RT placed Pt on 3L [place]. Rt will continue to monitor.   Corwin Levins 03/02/2022, 8:56 AM

## 2022-03-02 NOTE — Progress Notes (Signed)
? ?NAME:  Jeffrey Ayala, MRN:  [id number], DOB:  29-Sep-1961, LOS: 8 ?ADMISSION DATE:  02/22/2022, CONSULTATION DATE:  4/2 ?REFERRING MD:  Doren Custard- ED CHIEF COMPLAINT: Septic shock ? ?History of Present Illness:  ?Jeffrey Ayala, is a 61 y.o. male, who presented to the [facility] ED with a chief complaint of nausea, vomiting, diarrhea. ? ?They have a pertinent past medical history of gastric ulcer, kidney stones, eosinophilic esophagitis, migraines  ? ?History obtained from [name] (spouse), on 4/1 patient began complaining of feeling unwell. Wife denies sick contacts, questionable food.  Wife denies taking any meds currently.  Around 2 PM on 4/2 the patient developed nausea, vomiting, and diarrhea while at a birthday party and soiled himself. EMS was called. When EMS arrived he was found to be in A-fib with a rate of 120-160. ? ?At the ED he was found to be febrile with a Tmax of 102.9.  A code sepsis was called in the emergency department, blood cultures were drawn, 2 L of IV fluid were given, vanc and cefepime were ordered.  CT chest negative for acute process.  CT abdomen pelvis with and without contrast notable for swollen scrotum concerning for infection, colon filled with diarrhea.  Urology and cardiology were consulted by the ED.  Despite fluid resuscitation the patient developed hypotension requiring vasopressors. ? ?PCCM was consulted for admission. ? ?Pertinent  Medical History  ?gastric ulcer, kidney stones, eosinophilic esophagitis, migraines  ? ?Significant Hospital Events: ?Including procedures, antibiotic start and stop dates in addition to other pertinent events   ?4/2 Presented to the ED. CT chest negative for acute process.  CT abdomen pelvis with and without contrast notable for swollen scrotum concerning for infection, colon filled with diarrhea.  Urology and cardiology were consulted by the ED. Vanc, cefepime started.  ?4/3 Intubated overnight, brief cardiac arrest. Escalating pressors. ?4/5  started on methylene blue ?4/6 started on CRRT ?4/8 appears more responsive ?4/9 responsive ? ? ?Interim History / Subjective:  ?Patient came off of vasopressors ?He is tolerating spontaneous breathing trial ? ?Objective   ?Blood pressure (!) 159/73, pulse (!) 103, temperature 99 ?F (37.2 ?C), temperature source Rectal, resp. rate 19, height '5\' 10"'$  (1.778 m), weight 128.7 kg, SpO2 100 %.  ?   ?Vent Mode: PSV;CPAP ?FiO2 (%):  [40 %] 40 % ?Set Rate:  [22 bmp] 22 bmp ?Vt Set:  [600 mL] 600 mL ?PEEP:  [5 cmH20] 5 cmH20 ?Plateau Pressure:  [19 cmH20-22 cmH20] 21 cmH20  ? ?Intake/Output Summary (Last 24 hours) at 03/02/2022 1229 ?Last data filed at 03/02/2022 1202 ?Gross per 24 hour  ?Intake 3379.89 ml  ?Output 8208 ml  ?Net -4828.11 ml  ? ?Filed Weights  ? 02/28/22 0500 03/01/22 0315 03/02/22 0500  ?Weight: 131.1 kg 130.1 kg 128.7 kg  ? ? ?Examination: ?Physical exam: ?General: Crtitically ill-appearing male, orally intubated ?HEENT: [place]/AT, eyes anicteric.  ETT and OGT in place ?Neuro: Alert, awake, following commands ?Chest: Coarse breath sounds, no wheezes or rhonchi ?Heart: Irregularly irregular, no murmurs or gallops ?Abdomen: Soft, nontender, nondistended, bowel sounds present ?Skin: No rash, bluish discoloration of bilateral feet and scrotum ?Genitalia: Huge scrotal swelling, skin around is weeping ? ? ?Assessment & Plan:  ? ?Septic shock, improved ?Sources could be scrotal cellulitis versus right lower lobe aspiration pneumonia ?Patient is off vasopressors ?Continue IV Zosyn ?Cultures have been negative so far ?Stop hydrocortisone and fludrocortisone ? ?Scrotal edema with possible subcu infection ?Repeat CT scan remained stable, no signs of  gas, still edematous ?Appreciate urology follow-up ? ?Persistent atrial fibrillation with RVR ?Likely secondary to sepsis ?Rate has been better controlled now ?Continue as needed metoprolol ?Continue bivalirudin ? ?Acute hypoxemic respiratory failure require mechanical  ventilation, likely due to aspiration pneumonia ?Patient is tolerating spontaneous breathing trial ?Continue IV antibiotics ?We will try to extubate him ?Watch for respiratory distress ? ?Acute kidney injury due to ischemic ATN from septic shock ?Appreciate nephrology follow-up ?Patient remains on CRRT ?Avoid nephrotoxic agents ?Monitor intake and output ? ?Shock liver ?Monitor LFTs ? ?Acute septic/metabolic encephalopathy ?Avoid sedation ? ?Gastric ulcer ?Continue PPI ? ?Spouse updated at bedside ? ?Best Practice (right click and "Reselect all SmartList Selections" daily)  ? ?Diet/type: tubefeeds ?DVT prophylaxis: on bivalirudin due to unable to achieve therapeutic dosing on heparin and high risk for clotting.  ?GI prophylaxis: PPI ?Lines: Central line, Arterial Line, and yes and it is still needed ?Foley:  Yes, and it is still needed ?Code Status:  full code ?Last date of multidisciplinary goals of care discussion [4/10 please see plan of care note ? ?Labs   ?CBC: ?Recent Labs  ?Lab 02/26/22 ?2979 02/26/22 ?8921 02/27/22 ?1941 02/28/22 ?0413 02/28/22 ?1222 03/01/22 ?0308 03/02/22 ?0134 03/02/22 ?7408  ?WBC 15.8*  --  10.1 8.2  --  10.0  --  10.4  ?HGB 9.4*  --  8.9* 8.6* 8.8* 8.2* 9.2* 8.6*  ?HCT 27.9*  --  27.5* 26.4* 26.0* 25.9* 27.0* 26.3*  ?MCV 89.1  --  90.8 92.6  --  92.5  --  91.3  ?PLT 43* 40* 47* 67*  --  76*  --  90*  ? ? ?Basic Metabolic Panel: ?Recent Labs  ?Lab 02/26/22 ?0427 02/26/22 ?1600 02/27/22 ?0317 02/27/22 ?1503 02/28/22 ?0413 02/28/22 ?1222 02/28/22 ?1611 03/01/22 ?0308 03/01/22 ?1552 03/02/22 ?0134 03/02/22 ?0305  ?NA 134*   < > 134*   < > 135  136   < > 135 136 136 136 135  ?K 3.9   < > 4.7   < > 5.0  5.1   < > 5.0 4.3 4.6 4.0 3.8  ?CL 92*   < > 97*   < > 99  100  --  103 102 102  --  100  ?CO2 22   < > 24   < > 24  24  --  '22 24 23  '$ --  23  ?GLUCOSE 160*   < > 158*   < > 151*  149*  --  148* 143* 119*  --  127*  ?BUN 98*   < > 87*   < > 67*  67*  --  71* 64* 60*  --  53*  ?CREATININE  6.65*   < > 5.10*   < > 3.22*  3.22*  --  2.93* 2.56* 2.38*  --  2.28*  ?CALCIUM 6.2*   < > 7.0*   < > 7.9*  7.9*  --  8.0* 8.0* 8.0*  --  8.2*  ?MG 2.2  --  2.4  --  2.8*  --   --  2.7*  --   --  2.9*  ?PHOS 5.4*   < > 5.7*   < > 5.6*  5.6*  --  6.8* 5.5* 5.0*  --  5.1*  ? < > = values in this interval not displayed.  ? ?GFR: ?Estimated Creatinine Clearance: 46.4 mL/min (A) (by C-G formula based on SCr of 2.28 mg/dL (H)). ?Recent Labs  ?Lab 02/23/22 ?1846 02/24/22 ?1448 02/24/22 ?1856  02/24/22 ?1601 02/25/22 ?1914 02/27/22 ?7829 02/28/22 ?0413 03/01/22 ?0308 03/02/22 ?5621  ?WBC  --   --    < >  --    < > 10.1 8.2 10.0 10.4  ?LATICACIDVEN 3.9* 3.6*  --  3.9*  --   --   --   --   --   ? < > = values in this interval not displayed.  ? ? ?Liver Function Tests: ?Recent Labs  ?Lab 02/25/22 ?0434 02/26/22 ?0427 02/26/22 ?1600 02/27/22 ?0317 02/27/22 ?1503 02/28/22 ?0413 02/28/22 ?1611 03/01/22 ?0308 03/01/22 ?1552 03/02/22 ?0305  ?AST 87* 85*  --  97*  --   --   --  143*  --  123*  ?ALT 54* 49*  --  62*  --   --   --  118*  --  141*  ?ALKPHOS 96 152*  --  202*  --   --   --  465*  --  564*  ?BILITOT 11.8* 13.9*  --  15.4*  --   --   --  17.9*  --  11.6*  ?PROT 4.6* 4.9*  --  5.1*  --   --   --  5.6*  --  6.1*  ?ALBUMIN 2.3* 2.2*   < > 2.1*   < > 2.1* 2.0* 2.0*  2.0* 2.1* 2.2*  ? < > = values in this interval not displayed.  ? ?No results for input(s): LIPASE, AMYLASE in the last 168 hours. ?Recent Labs  ?Lab 03/01/22 ?0308  ?AMMONIA 45*  ? ? ?ABG ?   ?Component Value Date/Time  ? PHART 7.380 03/02/2022 0134  ? PCO2ART 45.2 03/02/2022 0134  ? PO2ART 211 (H) 03/02/2022 0134  ? HCO3 26.7 03/02/2022 0134  ? TCO2 28 03/02/2022 0134  ? ACIDBASEDEF 14.0 (H) 02/23/2022 0442  ? O2SAT 100 03/02/2022 0134  ? ?  ? ?Coagulation Profile: ?Recent Labs  ?Lab 02/24/22 ?3086 02/26/22 ?5784  ?INR 2.2* 1.3*  ? ?Total critical care time: 43 minutes ? ?Performed by: Jacky Kindle ?  ?Critical care time was exclusive of separately billable  procedures and treating other patients. ?  ?Critical care was necessary to treat or prevent imminent or life-threatening deterioration. ?  ?Critical care was time spent personally by me on the following activities: developme

## 2022-03-02 NOTE — Progress Notes (Signed)
ANTICOAGULATION CONSULT NOTE ? ?Pharmacy Consult for heparin > bivalirudin ?Indication: atrial fibrillation ? ?Allergies  ?Allergen Reactions  ? Codeine Hives  ?  Insomnia, jittery  ? Flomax [Tamsulosin Hcl] Nausea Only  ?  Nausea and fatigue  ? ? ?Patient Measurements: ?Height: '5\' 10"'$  (177.8 cm) ?Weight: 128.7 kg (283 lb 11.7 oz) ?IBW/kg (Calculated) : 73 ?Heparin Dosing Weight: 95 kg ? ?Vital Signs: ?Temp: 98.2 ?F (36.8 ?C) (04/10 1458) ?Temp Source: Rectal (04/10 1152) ?BP: 129/63 (04/10 1800) ?Pulse Rate: 104 (04/10 1800) ? ?Labs: ?Recent Labs  ?  02/28/22 ?0413 02/28/22 ?1222 03/01/22 ?0308 03/01/22 ?1552 03/01/22 ?1655 03/02/22 ?0134 03/02/22 ?0305 03/02/22 ?1556  ?HGB 8.6*   < > 8.2*  --   --  9.2* 8.6*  --   ?HCT 26.4*   < > 25.9*  --   --  27.0* 26.3*  --   ?PLT 67*  --  76*  --   --   --  90*  --   ?APTT 54*   < > 55*  --  59*  --  63* 65*  ?CREATININE 3.22*  3.22*   < > 2.56* 2.38*  --   --  2.28* 2.20*  ? < > = values in this interval not displayed.  ? ? ? ?Estimated Creatinine Clearance: 48.1 mL/min (A) (by C-G formula based on SCr of 2.2 mg/dL (H)). ? ? ?Assessment:  ?61 y.o. male with new onset Afib RVR, likely secondary to sepsis.  Urology seeing for hematuria.  DIC panel on 4/4 showed elevated aPTT at >200 s and elevated INR at 2.2. Stat heparin level ordered afterwards  and resulted subtherapeutic (0.24). Discussed with CCM and this is likely due to sepsis. Repeat DIC panel 4/6 still negative, AT3 ordered and 36 indicating AT3 deficiency. Patient was transitioned from heparin gtt to bivalirudin gtt after dialysis catheter insertion on 4/6.  ? ?aPTT therapeutic at 65 seconds is therapeutic on bivalirudin 0.06 mg/kg/hr. Level drawn appropriately. No signs of bleeding noted per RN.  ? ?Plan: ?Continue bivalirudin 0.06 mg/kg/hr (use total body weight) ?Continue to check aPTT q12h ?Check CBC daily  ?Monitor for s/s of bleeding  ? ?Thank you for involving pharmacy in this patient's care. ? ?Cristela Felt,  PharmD, BCPS ?Clinical Pharmacist ?03/02/2022 6:16 PM ? ? ? ?

## 2022-03-02 NOTE — Progress Notes (Signed)
ANTICOAGULATION CONSULT NOTE ? ?Pharmacy Consult for heparin > bivalirudin ?Indication: atrial fibrillation ? ?Allergies  ?Allergen Reactions  ? Codeine Hives  ?  Insomnia, jittery  ? Flomax [Tamsulosin Hcl] Nausea Only  ?  Nausea and fatigue  ? ? ?Patient Measurements: ?Height: '5\' 10"'$  (177.8 cm) ?Weight: 128.7 kg (283 lb 11.7 oz) ?IBW/kg (Calculated) : 73 ?Heparin Dosing Weight: 95 kg ? ?Vital Signs: ?Temp: 98.2 ?F (36.8 ?C) (04/10 2536) ?Temp Source: Oral (04/10 6440) ?BP: 166/58 (04/10 0742) ?Pulse Rate: 83 (04/10 0745) ? ?Labs: ?Recent Labs  ?  02/28/22 ?0413 02/28/22 ?1222 03/01/22 ?0308 03/01/22 ?1552 03/01/22 ?1655 03/02/22 ?0134 03/02/22 ?0305  ?HGB 8.6*   < > 8.2*  --   --  9.2* 8.6*  ?HCT 26.4*   < > 25.9*  --   --  27.0* 26.3*  ?PLT 67*  --  76*  --   --   --  90*  ?APTT 54*   < > 55*  --  59*  --  63*  ?CREATININE 3.22*  3.22*   < > 2.56* 2.38*  --   --  2.28*  ? < > = values in this interval not displayed.  ? ? ? ?Estimated Creatinine Clearance: 46.4 mL/min (A) (by C-G formula based on SCr of 2.28 mg/dL (H)). ? ? ?Assessment:  ?61 y.o. male with new onset Afib RVR, likely secondary to sepsis.  Urology seeing for hematuria.  DIC panel on 4/4 showed elevated aPTT at >200 s and elevated INR at 2.2. Stat heparin level ordered afterwards  and resulted subtherapeutic (0.24). Discussed with CCM and this is likely due to sepsis. Repeat DIC panel 4/6 still negative, AT3 ordered and 36 indicating AT3 deficiency. Patient was transitioned from heparin gtt to bivalirudin gtt after dialysis catheter insertion on 4/6.  ? ?aPTT therapeutic at 63 seconds this AM. No signs of bleeding noted. IV running appropriately.  ? ?Plan: ?Continue bivalirudin 0.06 mg/kg/hr (use total body weight) ?Continue to check aPTT q12h ?Check CBC daily  ?Monitor for s/s of bleeding  ? ?Thank you for involving pharmacy in this patient's care. ? ?Elita Quick, PharmD ?PGY1 Ambulatory Care Pharmacy Resident ?03/02/2022 8:23  AM ? ?**Pharmacist phone directory can be found on [email or web address] listed under [place]** ? ? ?

## 2022-03-02 NOTE — Evaluation (Signed)
Clinical/Bedside Swallow Evaluation ?Patient Details  ?Name: ABDUALLAH DOOLITTLE ?MRN: [id number] ?Date of Birth: 26-Mar-1961 ? ?Today's Date: 03/02/2022 ?Time: SLP Start Time (ACUTE ONLY): 1550 SLP Stop Time (ACUTE ONLY): 1602 ?SLP Time Calculation (min) (ACUTE ONLY): 12 min ? ?Past Medical History:  ?Past Medical History:  ?Diagnosis Date  ? Concussion   ? Eosinophilic esophagitis   ? Gastric ulcer   ? History of kidney stones   ? Migraine   ? ?Past Surgical History:  ?Past Surgical History:  ?Procedure Laterality Date  ? CYSTOSCOPY    ? DENTAL SURGERY    ? FINGER SURGERY    ? HERNIA REPAIR    ? at age 49  ? KNEE ARTHROSCOPY Left   ? LITHOTRIPSY    ? NEPHROLITHOTOMY    ? TONSILLECTOMY    ? TOTAL KNEE ARTHROPLASTY Bilateral 11/04/2018  ? Procedure: BILATERAL TOTAL KNEE ARTHROPLASTY;  Surgeon: Nadara Mustard, MD;  Location: [facility] OR;  Service: Orthopedics;  Laterality: Bilateral;  spinal/epidural per anesthesiologist  ? ?HPI:  ?61 y.o. male presented to the [facility] ED with a chief complaint of nausea, vomiting, diarrhea. Dx septic shock (sources could be scrotal cellulitis versus right lower lobe aspiration pneumonia), afib, acute hypoxemic resp failure requiring intubation 4/3-4/10, AKI from septic shock, metabolic encephalopathy.  ?  ?Assessment / Plan / Recommendation  ?Clinical Impression ? Pt participated in clinical swallowing assessment. Normal oral mechanism exam; dentition intact. No focal deficits. Voice quality is good despite seven-day intubation.  Pt accepted ice chips, sips of water, and puree with good oral attention, palpable swallow response, and no s/s of aspiration. RR within parameters to achieve normal swallow/respiratory synchrony.  Recommend continuing full liquid diet (per pt's preference) and advance per nursing discretion. No SLP needs identified. D/W pt, family, and RN. Our service will sign off. ?SLP Visit Diagnosis: Dysphagia, unspecified (R13.10) ?   ?Aspiration Risk ? No limitations  ?  ?Diet  Recommendation   Full liquids ? ?Medication Administration: Whole meds with liquid  ?  ?Other  Recommendations Oral Care Recommendations: Oral care BID   ? ?Recommendations for follow up therapy are one component of a multi-disciplinary discharge planning process, led by the attending physician.  Recommendations may be updated based on patient status, additional functional criteria and insurance authorization. ? ?Follow up Recommendations No SLP follow up  ? ? ?  ? ? ?Swallow Study   ?General HPI: 61 y.o. male presented to the [facility] ED with a chief complaint of nausea, vomiting, diarrhea. Dx septic shock (sources could be scrotal cellulitis versus right lower lobe aspiration pneumonia), afib, acute hypoxemic resp failure requiring intubation 4/3-4/10, AKI from septic shock, metabolic encephalopathy. ?Type of Study: Bedside Swallow Evaluation ?Previous Swallow Assessment: no ?Diet Prior to this Study: Thin liquids ?Temperature Spikes Noted: No ?Respiratory Status: Nasal cannula ?History of Recent Intubation: Yes ?Length of Intubations (days): 7 days ?Date extubated: 03/02/22 ?Behavior/Cognition: Alert;Cooperative ?Oral Cavity Assessment: Within Functional Limits ?Oral Care Completed by SLP: Recent completion by staff ?Oral Cavity - Dentition: Adequate natural dentition ?Vision: Functional for self-feeding ?Self-Feeding Abilities: Needs assist ?Patient Positioning: Upright in bed ?Baseline Vocal Quality: Normal ?Volitional Cough: Strong ?Volitional Swallow: Able to elicit  ?  ?Oral/Motor/Sensory Function Overall Oral Motor/Sensory Function: Within functional limits   ?Ice Chips Ice chips: Within functional limits   ?Thin Liquid Thin Liquid: Within functional limits  ?  ?Nectar Thick Nectar Thick Liquid: Not tested   ?Honey Thick Honey Thick Liquid: Not tested   ?Puree Puree: Within  functional limits   ?Solid ? ? ?  Solid: Not tested  ? ?  ? ?[name] ?03/02/2022,4:22 PM ?Marchelle Folks L. Lakeesha Fontanilla, MA  CCC/SLP ?Acute Rehabilitation Services ?Office number [phone] ?Pager [phone] ? ? ? ?

## 2022-03-02 NOTE — Plan of Care (Signed)
Interdisciplinary Goals of Care Family Meeting ? ? ?Date carried out:: 03/02/2022 ? ?Location of the meeting: Bedside ? ?Member's involved: Physician, Bedside Registered Nurse, and Family Member or next of kin ? ?Durable Power of Attorney or acting medical decision maker: Jeffrey Ayala ? ?Discussion: We discussed goals of care for Jeffrey Ayala .   ? ?The Clinical status was relayed to patient's wife at bedside in detail. ?  ?Updated and notified of patients medical condition. ?  ?  ?Patient with multiple organ dysfunction, now slowly improving but remain on CRRT and mechanical ventilator, patient is tolerating spontaneous breathing trial so hopefully can come off of ventilator and probably from CRRT at some point. ? ? ?Code status: Full Code ? ?Disposition: Continue current acute care ? ?  ?Family are satisfied with Plan of action and management. All questions answered ?  ?Jacky Kindle MD ?[facility] ?See Amion for pager ?If no response to pager, please call [phone] until 7pm ?After 7pm, Please call E-link [phone] ? ?

## 2022-03-02 NOTE — Progress Notes (Signed)
eLink Physician-Brief Progress Note ?Patient Name: Jeffrey Ayala ?DOB: 06/10/1961 ?MRN: [id number] ? ? ?Date of Service ? 03/02/2022  ?HPI/Events of Note ? Extubated with thick secretions with good cough. Request for mucolytic.   ?eICU Interventions ? Plan: ?Guaifenesin 600 mg PO now and BID.   ? ? ? ?Intervention Category ?Major Interventions: Other: ? ?Xitlaly Ault Cornelia Copa ?03/02/2022, 9:00 PM ?

## 2022-03-02 NOTE — Progress Notes (Signed)
Pt received scheduled PO Oxy and PRN Tylenol about 1.5 hrs ago and reports pain of 5/10 scrotum pain. Attempted to reposition pt and scrotum swing with no relief. Reached out to [facility] MD for additional pain meds/break through pain medication. ?

## 2022-03-02 NOTE — Progress Notes (Signed)
eLink Physician-Brief Progress Note ?Patient Name: Jeffrey Ayala ?DOB: 1961/02/12 ?MRN: [id number] ? ? ?Date of Service ? 03/02/2022  ?HPI/Events of Note ? Patient c/o severe scrotal/penis pain (9/10).  ?eICU Interventions ? Plan: ?Fentanyl 25 mcg IV Q 4 hours PRN severe pain.   ? ? ? ?Intervention Category ?Major Interventions: Other: ? ?Uchechi Denison Cornelia Copa ?03/02/2022, 10:43 PM ?

## 2022-03-02 NOTE — Procedures (Signed)
I saw and evaluated the patient on CRRT.  I have made appropriate changes.  Lowering UF goal. ? ?Filed Weights  ? 02/28/22 0500 03/01/22 0315 03/02/22 0500  ?Weight: 131.1 kg 130.1 kg 128.7 kg  ? ? ?Recent Labs  ?Lab 03/02/22 ?0305  ?NA 135  ?K 3.8  ?CL 100  ?CO2 23  ?GLUCOSE 127*  ?BUN 53*  ?CREATININE 2.28*  ?CALCIUM 8.2*  ?PHOS 5.1*  ? ? ?Recent Labs  ?Lab 02/28/22 ?0413 02/28/22 ?2130 03/01/22 ?0308 03/02/22 ?0134 03/02/22 ?8657  ?WBC 8.2  --  10.0  --  10.4  ?HGB 8.6*   < > 8.2* 9.2* 8.6*  ?HCT 26.4*   < > 25.9* 27.0* 26.3*  ?MCV 92.6  --  92.5  --  91.3  ?PLT 67*  --  76*  --  90*  ? < > = values in this interval not displayed.  ? ? ?Scheduled Meds: ? amLODipine  10 mg Oral Daily  ? chlorhexidine gluconate (MEDLINE KIT)  15 mL Mouth Rinse BID  ? Chlorhexidine Gluconate Cloth  6 each Topical Q0600  ? [START ON 8/46/9629] folic acid  1 mg Oral Daily  ? insulin aspart  0-15 Units Subcutaneous Q4H  ? mouth rinse  15 mL Mouth Rinse BID  ? oxyCODONE  5 mg Oral Q4H  ? sodium chloride flush  10-40 mL Intracatheter Q12H  ? ?Continuous Infusions: ? sodium chloride 250 mL (03/02/22 1202)  ? bivalirudin (ANGIOMAX) infusion 0.5 mg/mL (Non-ACS indications) 0.06 mg/kg/hr (03/02/22 1200)  ? piperacillin-tazobactam 3.375 g (03/02/22 1205)  ? prismasol BGK 2/2.5 dialysis solution 2,000 mL/hr at 03/02/22 1242  ? prismasol BGK 2/2.5 replacement solution 500 mL/hr at 03/02/22 0503  ? prismasol BGK 2/2.5 replacement solution 400 mL/hr at 03/02/22 0229  ? ?PRN Meds:.acetaminophen, docusate sodium, heparin, metoprolol tartrate, polyethylene glycol, sodium chloride flush, white petrolatum   ?Santiago Bumpers,  MD ?03/02/2022, 1:06 PM ? ? ?

## 2022-03-02 NOTE — Progress Notes (Signed)
?  Subjective: ?Patient has been weaned off of the vent.  Able to answer questions and is engaged.  No complaints at this time.  Family at bedside. CT from 02/28/22 revealed severe scrotal edema with bilateral hydroceles, but no signs of abscess or necrosis ? ?Objective: ?Vital signs in last 24 hours: ?Temp:  [97.5 ?F (36.4 ?C)-99 ?F (37.2 ?C)] 98.2 ?F (36.8 ?C) (04/10 1458) ?Pulse Rate:  [65-119] 99 (04/10 1500) ?Resp:  [12-25] 12 (04/10 1500) ?BP: (123-229)/(58-74) 134/68 (04/10 1500) ?SpO2:  [92 %-100 %] 100 % (04/10 1500) ?Arterial Line BP: (102-188)/(42-102) 108/102 (04/09 1815) ?FiO2 (%):  [40 %] 40 % (04/10 0742) ?Weight:  [128.7 kg] 128.7 kg (04/10 0500) ? ?Intake/Output from previous day: ?04/09 0701 - 04/10 0700 ?In: 3791.5 [I.V.:1601.5; NG/GT:1940; IV ZMOQHUTML:465] ?Out: 0354 [Urine:10; SFKCL:275] ? ?Intake/Output this shift: ?Total I/O ?In: 689.8 [P.O.:240; I.V.:234.8; NG/GT:165; IV Piggyback:50] ?Out: 2078 [Other:2078] ? ?Physical Exam:  ?General: Alert and oriented ?CV: RRR, palpable distal pulses ?Lungs: CTAB, equal chest rise ?Abdomen: Soft, NTND, no rebound or guarding ?Gu: Diffuse peno-scrotal edema without signs of spreading erythema, cellulitis, crepitus or necrosis.  The scrotum has a greenish blue appearance from prior methylene blue administration with areas of skin sloughing involving the anterior portion of the scrotum  Foley in place and draining blue urine.  ?Ext: NT, No erythema, diffuse anasarca  ? ?Lab Results: ?Recent Labs  ?  03/01/22 ?0308 03/02/22 ?0134 03/02/22 ?1700  ?HGB 8.2* 9.2* 8.6*  ?HCT 25.9* 27.0* 26.3*  ? ?BMET ?Recent Labs  ?  03/01/22 ?1552 03/02/22 ?0134 03/02/22 ?0305  ?NA 136 136 135  ?K 4.6 4.0 3.8  ?CL 102  --  100  ?CO2 23  --  23  ?GLUCOSE 119*  --  127*  ?BUN 60*  --  53*  ?CREATININE 2.38*  --  2.28*  ?CALCIUM 8.0*  --  8.2*  ? ? ? ?Studies/Results: ?No results found. ? ?Assessment/Plan: ?61 year old male with severe sepsis and diffuse scrotal edema ? ?-Keep  scrotum elevated.  Scrotal edema should progressively improve with ambulation and diuresis.  No signs of acute infection on examination or recent CT.  Will continue to monitor.  ? ? LOS: 8 days  ? ?Ellison Hughs, MD ?[facility] ?Pager: [phone] ? ?03/02/2022, 3:55 PM  ? ?

## 2022-03-02 NOTE — Evaluation (Addendum)
Physical Therapy Evaluation ?Patient Details ?Name: Jeffrey Ayala ?MRN: [id number] ?DOB: [birth year] ?Today's Date: 03/02/2022 ? ?History of Present Illness ? Pt adm 5/2 c/o N/V/D and found to have septic shock due to scrotal cellulitis vs RLL PNA.  Pt intubated 4/3 and brief cardiac arrest post intubation. [name] started 4/5.  Pt with AKI and started on CRRT on 4/6. Pt extubated 4/10. Pt also with afib with rvr. PMH - Bil TKR, migraines  ?Clinical Impression ? Pt admitted with above diagnosis and presents to PT with functional limitations due to deficits listed below (See PT problem list). Pt needs skilled PT to maximize independence and safety. Recommend acute inpatient rehab for further therapy when pt more medically ready and progressing with mobility. Pt motivated to mobilize.   ?   ?   ? ?Recommendations for follow up therapy are one component of a multi-disciplinary discharge planning process, led by the attending physician.  Recommendations may be updated based on patient status, additional functional criteria and insurance authorization. ? ?Follow Up Recommendations Acute inpatient rehab (3hours/day) ? ?  ?Assistance Recommended at Discharge Frequent or constant Supervision/Assistance  ?Patient can return home with the following ? Two people to help with walking and/or transfers;Two people to help with bathing/dressing/bathroom;Help with stairs or ramp for entrance;Assist for transportation ? ?  ?Equipment Recommendations Wheelchair (measurements PT);Wheelchair cushion (measurements PT);Hospital bed;Other (comment) (hoyer lift)  ?Recommendations for Other Services ?    ?  ?Functional Status Assessment Patient has had a recent decline in their functional status and demonstrates the ability to make significant improvements in function in a reasonable and predictable amount of time.  ? ?  ?Precautions / Restrictions Precautions ?Precautions: Fall ?Precaution Comments: CRRT, swollen scrotum  ? ?   ? ?Mobility ? Bed Mobility ?Overal bed mobility: Needs Assistance ?Bed Mobility: Supine to Sit, Sit to Supine ?  ?  ?Supine to sit: +2 for physical assistance, Max assist, HOB elevated (3rd person for lines) ?Sit to supine: +2 for physical assistance, Total assist (3rd person for lines) ?  ?General bed mobility comments: Assist to bring legs off of bed, elevate trunk into sitting and bring hips to EOB. Assist for all aspects to return to supine ?  ? ?Transfers ?  ?  ?  ?  ?  ?  ?  ?  ?  ?  ?  ? ?Ambulation/Gait ?  ?  ?  ?  ?  ?  ?  ?  ? ?Stairs ?  ?  ?  ?  ?  ? ?Wheelchair Mobility ?  ? ?Modified Rankin (Stroke Patients Only) ?  ? ?  ? ?Balance Overall balance assessment: Needs assistance ?Sitting-balance support: Bilateral upper extremity supported, Feet supported ?Sitting balance-Leahy Scale: Poor ?Sitting balance - Comments: Sat EOB 7-8 minutes with min guard initially and then incr to mod assist with fatigue ?Postural control: Posterior lean ?  ?  ?  ?  ?  ?  ?  ?  ?  ?  ?  ?  ?  ?  ?   ? ? ? ?Pertinent Vitals/Pain Pain Assessment ?Pain Assessment: CPOT ?Facial Expression: Relaxed, neutral ?Body Movements: Absence of movements ?Muscle Tension: Relaxed ?Compliance with ventilator (intubated pts.): N/A ?Vocalization (extubated pts.): Talking in normal tone or no sound ?CPOT Total: 0  ? ? ?Home Living Family/patient expects to be discharged to:: Private residence ?Living Arrangements: Spouse/significant other ?Available Help at Discharge: Family;Available 24 hours/day ?Type of Home: House ?Home Access: Stairs to  enter ?Entrance Stairs-Rails: Right;Left;Can reach both ?Entrance Stairs-Number of Steps: 5 ?  ?Home Layout: Two level;Able to live on main level with bedroom/bathroom ?Home Equipment: Shower seat - built Medical sales representative (2 wheels);Cane - single point ?   ?  ?Prior Function Prior Level of Function : Independent/Modified Independent;Working/employed;Driving ?  ?  ?  ?  ?  ?  ?Mobility Comments: No  assistive device. Works in Press photographer ?  ?  ? ? ?Hand Dominance  ?   ? ?  ?Extremity/Trunk Assessment  ? Upper Extremity Assessment ?Upper Extremity Assessment: Defer to OT evaluation ?  ? ?Lower Extremity Assessment ?Lower Extremity Assessment: Generalized weakness ?  ? ?   ?Communication  ? Communication: No difficulties  ?Cognition Arousal/Alertness: Awake/alert ?Behavior During Therapy: Flat affect ?Overall Cognitive Status: Impaired/Different from baseline ?Area of Impairment: Following commands, Problem solving ?  ?  ?  ?  ?  ?  ?  ?  ?  ?  ?  ?Following Commands: Follows one step commands consistently, Follows one step commands with increased time ?  ?  ?Problem Solving: Slow processing, Requires verbal cues, Decreased initiation ?  ?  ?  ? ?  ?General Comments General comments (skin integrity, edema, etc.): VSS on RA ? ?  ?Exercises    ? ?Assessment/Plan  ?  ?PT Assessment Patient needs continued PT services  ?PT Problem List Decreased strength;Decreased activity tolerance;Decreased balance;Decreased mobility;Decreased cognition ? ?   ?  ?PT Treatment Interventions DME instruction;Gait training;Functional mobility training;Therapeutic activities;Therapeutic exercise;Balance training;Patient/family education;Cognitive remediation   ? ?PT Goals (Current goals can be found in the Care Plan section)  ?Acute Rehab PT Goals ?Patient Stated Goal: not stated ?PT Goal Formulation: With patient/family ?Time For Goal Achievement: 03/16/22 ?Potential to Achieve Goals: Good ? ?  ?Frequency Min 3X/week ?  ? ? ?Co-evaluation   ?  ?  ?  ?  ? ? ?  ?AM-PAC PT "6 Clicks" Mobility  ?Outcome Measure Help needed turning from your back to your side while in a flat bed without using bedrails?: Total ?Help needed moving from lying on your back to sitting on the side of a flat bed without using bedrails?: Total ?Help needed moving to and from a bed to a chair (including a wheelchair)?: Total ?Help needed standing up from a chair using  your arms (e.g., wheelchair or bedside chair)?: Total ?Help needed to walk in hospital room?: Total ?Help needed climbing 3-5 steps with a railing? : Total ?6 Click Score: 6 ? ?  ?End of Session   ?Activity Tolerance: Patient limited by fatigue ?Patient left: in bed;with call bell/phone within reach;with bed alarm set;with nursing/sitter in room;with family/visitor present ?Nurse Communication: Mobility status (Nurses present and assisting with mobility) ?PT Visit Diagnosis: Other abnormalities of gait and mobility (R26.89);Muscle weakness (generalized) (M62.81) ?  ? ?Time: 7673-4193 ?PT Time Calculation (min) (ACUTE ONLY): 22 min ? ? ?Charges:   PT Evaluation ?$PT Eval Moderate Complexity: 1 Mod ?  ?  ?   ? ? ?[facility] PT ?Acute Rehabilitation Services ?Office [phone] ? ? ?Shary Decamp [facility] ?03/02/2022, 6:40 PM ? ?

## 2022-03-03 DIAGNOSIS — J9601 Acute respiratory failure with hypoxia: Secondary | ICD-10-CM | POA: Diagnosis not present

## 2022-03-03 DIAGNOSIS — N179 Acute kidney failure, unspecified: Secondary | ICD-10-CM

## 2022-03-03 DIAGNOSIS — A419 Sepsis, unspecified organism: Secondary | ICD-10-CM | POA: Diagnosis not present

## 2022-03-03 DIAGNOSIS — R6521 Severe sepsis with septic shock: Secondary | ICD-10-CM | POA: Diagnosis not present

## 2022-03-03 DIAGNOSIS — I998 Other disorder of circulatory system: Secondary | ICD-10-CM | POA: Diagnosis not present

## 2022-03-03 LAB — RENAL FUNCTION PANEL
Albumin: 2.1 g/dL — ABNORMAL LOW (ref 3.5–5.0)
Albumin: 2.1 g/dL — ABNORMAL LOW (ref 3.5–5.0)
Anion gap: 9 (ref 5–15)
Anion gap: 11 (ref 5–15)
BUN: 43 mg/dL — ABNORMAL HIGH (ref 6–20)
BUN: 47 mg/dL — ABNORMAL HIGH (ref 6–20)
CO2: 27 mmol/L (ref 22–32)
CO2: 24 mmol/L (ref 22–32)
Calcium: 8.3 mg/dL — ABNORMAL LOW (ref 8.9–10.3)
Calcium: 8.1 mg/dL — ABNORMAL LOW (ref 8.9–10.3)
Chloride: 100 mmol/L (ref 98–111)
Chloride: 99 mmol/L (ref 98–111)
Creatinine, Ser: 2.19 mg/dL — ABNORMAL HIGH (ref 0.61–1.24)
Creatinine, Ser: 2.71 mg/dL — ABNORMAL HIGH (ref 0.61–1.24)
GFR, Estimated: 34 mL/min — ABNORMAL LOW (ref >60)
GFR, Estimated: 26 mL/min — ABNORMAL LOW (ref >60)
Glucose, Bld: 100 mg/dL — ABNORMAL HIGH (ref 70–99)
Glucose, Bld: 113 mg/dL — ABNORMAL HIGH (ref 70–99)
Phosphorus: 4 mg/dL (ref 2.5–4.6)
Phosphorus: 4.8 mg/dL — ABNORMAL HIGH (ref 2.5–4.6)
Potassium: 3.5 mmol/L (ref 3.5–5.1)
Potassium: 3.4 mmol/L — ABNORMAL LOW (ref 3.5–5.1)
Sodium: 136 mmol/L (ref 135–145)
Sodium: 134 mmol/L — ABNORMAL LOW (ref 135–145)

## 2022-03-03 LAB — CBC
HCT: 25.5 % — ABNORMAL LOW (ref 39.0–52.0)
Hemoglobin: 8.3 g/dL — ABNORMAL LOW (ref 13.0–17.0)
MCH: 29.9 pg (ref 26.0–34.0)
MCHC: 32.5 g/dL (ref 30.0–36.0)
MCV: 91.7 fL (ref 80.0–100.0)
Platelets: 115 10*3/uL — ABNORMAL LOW (ref 150–400)
RBC: 2.78 MIL/uL — ABNORMAL LOW (ref 4.22–5.81)
RDW: 16.4 % — ABNORMAL HIGH (ref 11.5–15.5)
WBC: 13.3 10*3/uL — ABNORMAL HIGH (ref 4.0–10.5)
nRBC: 0.2 % (ref 0.0–0.2)

## 2022-03-03 LAB — GLUCOSE, CAPILLARY
Glucose-Capillary: 100 mg/dL — ABNORMAL HIGH (ref 70–99)
Glucose-Capillary: 107 mg/dL — ABNORMAL HIGH (ref 70–99)
Glucose-Capillary: 112 mg/dL — ABNORMAL HIGH (ref 70–99)
Glucose-Capillary: 114 mg/dL — ABNORMAL HIGH (ref 70–99)
Glucose-Capillary: 144 mg/dL — ABNORMAL HIGH (ref 70–99)
Glucose-Capillary: 158 mg/dL — ABNORMAL HIGH (ref 70–99)

## 2022-03-03 LAB — APTT: aPTT: 66 seconds — ABNORMAL HIGH (ref 24–36)

## 2022-03-03 LAB — MAGNESIUM: Magnesium: 2.7 mg/dL — ABNORMAL HIGH (ref 1.7–2.4)

## 2022-03-03 LAB — ANTITHROMBIN III: AntiThromb III Func: 111 % (ref 75–120)

## 2022-03-03 MED ORDER — ASPIRIN EC 81 MG PO TBEC
81.0000 mg | DELAYED_RELEASE_TABLET | Freq: Every day | ORAL | Status: DC
  Administered 2022-03-03 – 2022-03-15 (×12): 81 mg via ORAL
  Filled 2022-03-03 (×13): qty 1

## 2022-03-03 MED ORDER — OXYCODONE HCL 5 MG PO TABS
10.0000 mg | ORAL_TABLET | ORAL | Status: DC
Start: 2022-03-03 — End: 2022-03-03

## 2022-03-03 MED ORDER — PIPERACILLIN-TAZOBACTAM IN DEX 2-0.25 GM/50ML IV SOLN
2.2500 g | Freq: Three times a day (TID) | INTRAVENOUS | Status: AC
  Administered 2022-03-03 – 2022-03-06 (×8): 2.25 g via INTRAVENOUS
  Filled 2022-03-03 (×11): qty 50

## 2022-03-03 MED ORDER — HEPARIN SODIUM (PORCINE) 5000 UNIT/ML IJ SOLN
5000.0000 [IU] | Freq: Three times a day (TID) | INTRAMUSCULAR | Status: DC
  Administered 2022-03-03 – 2022-03-08 (×14): 5000 [IU] via SUBCUTANEOUS
  Filled 2022-03-03 (×14): qty 1

## 2022-03-03 MED ORDER — OXYCODONE HCL 5 MG PO TABS
10.0000 mg | ORAL_TABLET | ORAL | Status: DC | PRN
  Administered 2022-03-03 – 2022-03-10 (×10): 10 mg via ORAL
  Filled 2022-03-03 (×12): qty 2

## 2022-03-03 MED ORDER — FENTANYL CITRATE (PF) 100 MCG/2ML IJ SOLN
50.0000 ug | INTRAMUSCULAR | Status: DC | PRN
  Administered 2022-03-03: 50 ug via INTRAVENOUS
  Filled 2022-03-03: qty 2

## 2022-03-03 MED ORDER — HYDROMORPHONE HCL 1 MG/ML IJ SOLN: 0.2500 mg | INTRAMUSCULAR | Status: DC | PRN

## 2022-03-03 MED ORDER — SODIUM CHLORIDE 0.9 % IV SOLN
0.0600 mg/kg/h | INTRAVENOUS | Status: DC
  Filled 2022-03-03: qty 250

## 2022-03-03 MED ORDER — ENSURE ENLIVE PO LIQD
237.0000 mL | Freq: Three times a day (TID) | ORAL | Status: DC
  Administered 2022-03-03 – 2022-03-04 (×4): 237 mL via ORAL

## 2022-03-03 MED ORDER — CHLORHEXIDINE GLUCONATE CLOTH 2 % EX PADS
6.0000 | MEDICATED_PAD | Freq: Every day | CUTANEOUS | Status: DC
  Administered 2022-03-04 – 2022-03-31 (×29): 6 via TOPICAL

## 2022-03-03 MED ORDER — RENA-VITE PO TABS
1.0000 | ORAL_TABLET | Freq: Every day | ORAL | Status: DC
  Administered 2022-03-03 – 2022-03-14 (×12): 1 via ORAL
  Filled 2022-03-03 (×12): qty 1

## 2022-03-03 NOTE — Procedures (Signed)
I saw and evaluated the patient on CRRT.  I have made appropriate changes.  Plan for no restart today when cartridge expires. Increase UF to 200 until he comes off ? ?Filed Weights  ? 03/01/22 0315 03/02/22 0500 03/03/22 0500  ?Weight: 130.1 kg 128.7 kg 117.6 kg  ? ? ?Recent Labs  ?Lab 03/03/22 ?0326  ?NA 136  ?K 3.5  ?CL 100  ?CO2 27  ?GLUCOSE 100*  ?BUN 43*  ?CREATININE 2.19*  ?CALCIUM 8.3*  ?PHOS 4.0  ? ? ?Recent Labs  ?Lab 03/01/22 ?0308 03/02/22 ?0134 03/02/22 ?3494 03/03/22 ?0326  ?WBC 10.0  --  10.4 13.3*  ?HGB 8.2* 9.2* 8.6* 8.3*  ?HCT 25.9* 27.0* 26.3* 25.5*  ?MCV 92.5  --  91.3 91.7  ?PLT 76*  --  90* 115*  ? ? ?Scheduled Meds: ? amLODipine  10 mg Oral Daily  ? aspirin EC  81 mg Oral Daily  ? chlorhexidine gluconate (MEDLINE KIT)  15 mL Mouth Rinse BID  ? [START ON 03/04/2022] Chlorhexidine Gluconate Cloth  6 each Topical Q0600  ? feeding supplement  237 mL Oral TID BM  ? folic acid  1 mg Oral Daily  ? guaiFENesin  600 mg Oral BID  ? heparin injection (subcutaneous)  5,000 Units Subcutaneous Q8H  ? insulin aspart  0-15 Units Subcutaneous Q4H  ? mouth rinse  15 mL Mouth Rinse BID  ? sodium chloride flush  10-40 mL Intracatheter Q12H  ? ?Continuous Infusions: ? sodium chloride 10 mL/hr at 03/03/22 1100  ? piperacillin-tazobactam Stopped (03/03/22 0537)  ? prismasol BGK 2/2.5 dialysis solution 2,000 mL/hr at 03/03/22 1112  ? prismasol BGK 2/2.5 replacement solution 500 mL/hr at 03/03/22 1113  ? prismasol BGK 2/2.5 replacement solution 400 mL/hr at 03/03/22 0304  ? ?PRN Meds:.acetaminophen, docusate sodium, heparin, HYDROmorphone (DILAUDID) injection, metoprolol tartrate, oxyCODONE, phenol, polyethylene glycol, sodium chloride flush, white petrolatum   ?Santiago Bumpers,  MD ?03/03/2022, 11:26 AM ? ? ?

## 2022-03-03 NOTE — Progress Notes (Signed)
Nutrition Follow-up ? ?DOCUMENTATION CODES:  ? ?Not applicable ? ?INTERVENTION:  ? ?- Ensure Enlive po TID, each supplement provides 350 kcal and 20 grams of protein ? ?- Double protein portions TID with meals ? ?- Agree with Regular diet order ? ?- Renal MVI daily ? ?- Encourage PO intake ? ?NUTRITION DIAGNOSIS:  ? ?Inadequate oral intake related to inability to eat as evidenced by NPO status. ? ?Progressing, pt now on Regular diet ? ?GOAL:  ? ?Patient will meet greater than or equal to 90% of their needs ? ?Progressing ? ?MONITOR:  ? ?PO intake, Supplement acceptance, Labs, Weight trends, Skin, I & O's ? ?REASON FOR ASSESSMENT:  ? ?Ventilator, Consult ?Enteral/tube feeding initiation and management (trickle tube feeds at 10 ml/hr per MD) ? ?ASSESSMENT:  ? ?61 year old male who presented to the ED on 4/02 with N/V/D. PMH of HTN, PUD, eosinophilic esophagitis, kidney stones. Pt admitted with septic shock, new onset atrial fibrillation, AKI. ? ?04/03 - intubated, brief cardiac arrest ?04/06 - CRRT initiated ?04/10 - extubated, diet advanced to full liquids ?04/11 - diet advanced to regular ? ?Discussed pt with RN. CRRT to stop today after cartridge expires per Nephrology. Plan is to monitor for renal recovery. RD to order renal MVI. ? ?Pt unavailable at time of RD visit. RN reports pt eating some of full liquid diet like ice cream. RD to order oral nutrition supplements to aid pt in meeting kcal and protein needs. Will also order double protein portions with meals. ? ?First measured weight: 126.4 kg on 4/05 ?Current weight: 117.6 kg ? ?Pt with moderate pitting edema to BUE and very deep pitting edema to BLE. Pt also with very deep pitting edema to perineal region. ? ?Meal Completion: 25% x 2 documented full liquid meals ? ?Medications reviewed and include: folic acid, SSI q 4 hours, angiomax drip, IV abx ? ?Labs reviewed: BUN 43, creatinine 2.19, magnesium 2.7, elevated LFTs, WBC 13.3, hemoglobin 8.3, platelets  115 ?CBG's: 55-144 x 24 hours ? ?UOP: 25 ml x 24 hours ?CRRT UF: 4998 ml x 24 hours ?Stool: 300 ml x 24 hours ?I/O's: +10.3 L since admit ? ?Diet Order:   ?Diet Order   ? ?       ?  Diet regular Room service appropriate? Yes; Fluid consistency: Thin  Diet effective now       ?  ? ?  ?  ? ?  ? ? ?EDUCATION NEEDS:  ? ?No education needs have been identified at this time ? ?Skin:  Skin Assessment: ?Skin Integrity Issues: ?Stage II: coccyx ?Other: skin tear L thigh ? ?Last BM:  03/03/22 rectal tube ? ?Height:  ? ?Ht Readings from Last 1 Encounters:  ?02/26/22 '5\' 10"'$  (1.778 m)  ? ? ?Weight:  ? ?Wt Readings from Last 1 Encounters:  ?03/03/22 117.6 kg  ? ? ?Ideal Body Weight:  75.5 kg ? ?BMI:  Body mass index is 37.2 kg/m?. ? ?Estimated Nutritional Needs:  ? ?Kcal:  2300-2500 ? ?Protein:  150-170 grams ? ?Fluid:  >/= 2.0 L ? ? ? ?Jeffrey Bryant, MS, RD, LDN ?Inpatient Clinical Dietitian ?Please see AMiON for contact information. ? ?

## 2022-03-03 NOTE — Evaluation (Signed)
Occupational Therapy Evaluation ?Patient Details ?Name: Jeffrey Ayala ?MRN: [id number] ?DOB: 14-Sep-1961 ?Today's Date: 03/03/2022 ? ? ?History of Present Illness Pt adm 4/2 c/o N/V/D and found to have septic shock due to scrotal cellulitis vs RLL PNA.  Pt intubated 4/3 and brief cardiac arrest post intubation. [name] started 4/5.  Pt with AKI and started on CRRT on 4/6. Pt extubated 4/10. Pt also with afib with rvr. PMH - Bil TKR, migraines  ? ?Clinical Impression ?  ?PTA patient independent and working. Pt admitted for above and presents with problem list below, including generalized weakness, edema, impaired cognition, decreased activity tolerance.  Pt initially reports March but is able to self correct when cued, requires increased time for processing and problem solving, demonstrates poor initiation, attention and recall. He currently requires total assist for all self care at this time.  Session remained bed level due to fatigue and needing +2 physical assist, but educated spouse on UE positioning and hand exercises to reduce edema.  Patient will benefit from further OT services while admitted and after dc at AIR level to optimize independence, safety and decrease burden of care.  ?   ? ?Recommendations for follow up therapy are one component of a multi-disciplinary discharge planning process, led by the attending physician.  Recommendations may be updated based on patient status, additional functional criteria and insurance authorization.  ? ?Follow Up Recommendations ? Acute inpatient rehab (3hours/day)  ?  ?Assistance Recommended at Discharge Frequent or constant Supervision/Assistance  ?Patient can return home with the following Two people to help with walking and/or transfers;Two people to help with bathing/dressing/bathroom;Assistance with cooking/housework;Assistance with feeding;Direct supervision/assist for medications management;Direct supervision/assist for financial management;Assist for  transportation;Help with stairs or ramp for entrance ? ?  ?Functional Status Assessment ? Patient has had a recent decline in their functional status and demonstrates the ability to make significant improvements in function in a reasonable and predictable amount of time.  ?Equipment Recommendations ? Other (comment) (defer)  ?  ?Recommendations for Other Services Rehab consult ? ? ?  ?Precautions / Restrictions Precautions ?Precautions: Fall ?Precaution Comments: CRRT, swollen scrotum ?Restrictions ?Weight Bearing Restrictions: No  ? ?  ? ?Mobility Bed Mobility ?  ?  ?  ?  ?  ?  ?  ?General bed mobility comments: remained bed level due to not having +2 and lethargy ?  ? ?Transfers ?  ?  ?  ?  ?  ?  ?  ?  ?  ?  ?  ? ?  ?Balance   ?  ?  ?  ?  ?  ?  ?  ?  ?  ?  ?  ?  ?  ?  ?  ?  ?  ?  ?   ? ?ADL either performed or assessed with clinical judgement  ? ?ADL Overall ADL's : Needs assistance/impaired ?  ?  ?Grooming: Total assistance;Bed level ?  ?  ?  ?  ?  ?  ?  ?  ?  ?  ?  ?  ?  ?  ?  ?  ?General ADL Comments: total assist for all self care at this time  ? ? ? ?Vision   ?Additional Comments: continue assessment  ?   ?Perception   ?  ?Praxis   ?  ? ?Pertinent Vitals/Pain Pain Assessment ?Pain Assessment: Faces ?Faces Pain Scale: Hurts little more ?Pain Location: generalized ?Pain Descriptors / Indicators: Discomfort ?Pain Intervention(s): Limited activity within patient's tolerance,  Monitored during session, Repositioned  ? ? ? ?Hand Dominance Right ?  ?Extremity/Trunk Assessment Upper Extremity Assessment ?Upper Extremity Assessment: Generalized weakness (edema noted) ?  ?  ?  ?  ?  ?Communication Communication ?Communication: No difficulties ?  ?Cognition Arousal/Alertness: Lethargic ?Behavior During Therapy: Flat affect ?Overall Cognitive Status: Impaired/Different from baseline ?Area of Impairment: Orientation, Attention, Memory, Following commands, Awareness, Problem solving ?  ?  ?  ?  ?  ?  ?  ?  ?Orientation  Level: Disoriented to, Time (initally reports march) ?Current Attention Level: Focused ?Memory: Decreased short-term memory ?Following Commands: Follows one step commands consistently, Follows one step commands with increased time ?  ?Awareness: Emergent ?Problem Solving: Slow processing, Requires verbal cues, Difficulty sequencing, Decreased initiation, Requires tactile cues ?General Comments: pt with slow processing, decreased initation and sequencing.  poor attention, anticipate limited by lethargy.  initially reports march but corrects self when therapist questions. ?  ?  ?General Comments  VSS on RA, spouse at side.  Educated on UE positioning and placed on pillows, UE exercises (hand pumps) x5 reps 3 sets/day. ? ?  ?Exercises Exercises: Other exercises ?Other Exercises ?Other Exercises: PROM to BUEs, progressing to [facility] of UEs but limited by weakness. ?  ?Shoulder Instructions    ? ? ?Home Living Family/patient expects to be discharged to:: Private residence ?Living Arrangements: Spouse/significant other ?Available Help at Discharge: Family;Available 24 hours/day ?Type of Home: House ?Home Access: Stairs to enter ?Entrance Stairs-Number of Steps: 5 ?Entrance Stairs-Rails: Right;Left;Can reach both ?Home Layout: Two level;Able to live on main level with bedroom/bathroom ?  ?  ?Bathroom Shower/Tub: Gaffer;Tub/shower unit (shower very small) ?  ?Bathroom Toilet: Handicapped height ?  ?  ?Home Equipment: Shower seat - built Medical sales representative (2 wheels);Cane - single point ?  ?  ?  ? ?  ?Prior Functioning/Environment Prior Level of Function : Independent/Modified Independent;Working/employed;Driving ?  ?  ?  ?  ?  ?  ?  ?ADLs Comments: works, drives ?  ? ?  ?  ?OT Problem List: Decreased strength;Decreased activity tolerance;Decreased range of motion;Impaired balance (sitting and/or standing);Decreased coordination;Decreased cognition;Decreased safety awareness;Decreased knowledge of use of DME or  AE;Decreased knowledge of precautions;Cardiopulmonary status limiting activity;Pain;Increased edema;Impaired UE functional use;Obesity ?  ?   ?OT Treatment/Interventions: Self-care/ADL training;Therapeutic exercise;DME and/or AE instruction;Therapeutic activities;Patient/family education;Balance training;Cognitive remediation/compensation  ?  ?OT Goals(Current goals can be found in the care plan section) Acute Rehab OT Goals ?Patient Stated Goal: get better ?OT Goal Formulation: With patient ?Time For Goal Achievement: 03/17/22 ?Potential to Achieve Goals: Good  ?OT Frequency: Min 2X/week ?  ? ?Co-evaluation   ?  ?  ?  ?  ? ?  ?AM-PAC OT "6 Clicks" Daily Activity     ?Outcome Measure Help from another person eating meals?: Total ?Help from another person taking care of personal grooming?: Total ?Help from another person toileting, which includes using toliet, bedpan, or urinal?: Total ?Help from another person bathing (including washing, rinsing, drying)?: Total ?Help from another person to put on and taking off regular upper body clothing?: Total ?Help from another person to put on and taking off regular lower body clothing?: Total ?6 Click Score: 6 ?  ?End of Session Nurse Communication: Mobility status ? ?Activity Tolerance: Patient limited by lethargy ?Patient left: in bed;with call bell/phone within reach;with family/visitor present ? ?OT Visit Diagnosis: Other abnormalities of gait and mobility (R26.89);Muscle weakness (generalized) (M62.81)  ?              ?  Time: 1030-1046 ?OT Time Calculation (min): 16 min ?Charges:  OT General Charges ?$OT Visit: 1 Visit ?OT Evaluation ?$OT Eval High Complexity: 1 High ? ?Jolaine Artist, OT ?Acute Rehabilitation Services ?Pager [phone] ?Office [phone] ? ? ?Delight Stare ?03/03/2022, 10:57 AM ?

## 2022-03-03 NOTE — Progress Notes (Signed)
eLink Physician-Brief Progress Note ?Patient Name: Jeffrey Ayala ?DOB: Dec 13, 1960 ?MRN: [id number] ? ? ?Date of Service ? 03/03/2022  ?HPI/Events of Note ? Pain - Minimal relief with Fentanyl 25 mcg IV.  ?eICU Interventions ? Plan: ?Increase Fentanyl to 50 mcg IV Q 4 hours PRN severe pain.   ? ? ? ?Intervention Category ?Major Interventions: Other: ? ?Jalyssa Fleisher Cornelia Copa ?03/03/2022, 3:14 AM ?

## 2022-03-03 NOTE — Progress Notes (Signed)
?[facility] ?Progress Note  ? ? ?Assessment/ Plan:   ?AKI: in the setting of severe septic shock likely ATN.  Normal baseline ?            - started CRRT 02/26/22 ? - change to 2K bath 02/28/22 with K slightly higher, systemic hep gtt changed to bivalirudin ?            -Continues to be total body volume overloaded but has improved.  Patient now extubated.  We will plan to no restart CRRT today and plan for dialysis again tomorrow mainly for volume removal ?  ?  ?2.  Septic shock ?            - severe ?            - on and off pressors ? - abx per primary ?            - Possibly related to scrotum but not a clear ?  ?3.  Scrotal Edema ?            - urology following.  Unclear if this is the source of his infection. ?  ?4.  Acute hypoxic RF ?            - Now extubated significantly improved.  Continue to monitor ? ?5.  Thrombocytopenia/ coagulopathy: abd [place] with cirrhosis, elevated Tbili no schistocytes on smear, likely sepsis related (? Shock liver).  Management per primary ? ?6.  Hypocalcemia: aggressive repletion, corrects to normal when accounting for albumin ? ?7.  Afib with RVR- hep--> bivalirudin gtt ?  ?8. Dispo: ICU ? ?Subjective:   ? ?Continues to have minimal urine output nearly anuric.  3.7 L negative yesterday with CRRT.  Running well.  Patient now extubated and alert.  States that he is feeling okay today.No further requirements of norepinephrine.  ? ?Objective:   ?BP (!) 97/53   Pulse 97   Temp 98.6 ?F (37 ?C)   Resp (!) 27   Ht '5\' 10"'  (1.778 m)   Wt 117.6 kg   SpO2 100%   BMI 37.20 kg/m?  ? ?Intake/Output Summary (Last 24 hours) at 03/03/2022 1123 ?Last data filed at 03/03/2022 1100 ?Gross per 24 hour  ?Intake 1536.73 ml  ?Output 4885 ml  ?Net -3348.27 ml  ? ?Weight change: -11.1 kg ? ?Physical Exam: ?AVW:PVXYI, alert, no distress ?HEENT eyes opened, + icteric  ?PULM coarse bilaterally, On nasal cannula ?CV Normal rate, no audible rub ?ABD distended, still soft, bowel sounds  present ?GU: swollen scrotum with deeply violaceous discoloration with some weeping ?EXT 2 + anasarca, mottled and cool bilateral soles of feet and toes, + jaundiced ?NEURO Awake, alert, oriented to person and place ? ?Imaging: ?No results found. ? ?Labs: ?BMET ?Recent Labs  ?Lab 02/28/22 ?0413 02/28/22 ?1222 02/28/22 ?1611 03/01/22 ?0308 03/01/22 ?1552 03/02/22 ?0134 03/02/22 ?0305 03/02/22 ?1556 03/03/22 ?0326  ?NA 135  136   < > 135 136 136 136 135 134* 136  ?K 5.0  5.1   < > 5.0 4.3 4.6 4.0 3.8 3.8 3.5  ?CL 99  100  --  103 102 102  --  100 100 100  ?CO2 24  24  --  '22 24 23  ' --  '23 23 27  ' ?GLUCOSE 151*  149*  --  148* 143* 119*  --  127* 125* 100*  ?BUN 67*  67*  --  71* 64* 60*  --  53* 49* 43*  ?  CREATININE 3.22*  3.22*  --  2.93* 2.56* 2.38*  --  2.28* 2.20* 2.19*  ?CALCIUM 7.9*  7.9*  --  8.0* 8.0* 8.0*  --  8.2* 8.1* 8.3*  ?PHOS 5.6*  5.6*  --  6.8* 5.5* 5.0*  --  5.1* 4.0 4.0  ? < > = values in this interval not displayed.  ? ?CBC ?Recent Labs  ?Lab 02/28/22 ?0413 02/28/22 ?1222 03/01/22 ?0308 03/02/22 ?0134 03/02/22 ?2567 03/03/22 ?2091  ?WBC 8.2  --  10.0  --  10.4 13.3*  ?HGB 8.6*   < > 8.2* 9.2* 8.6* 8.3*  ?HCT 26.4*   < > 25.9* 27.0* 26.3* 25.5*  ?MCV 92.6  --  92.5  --  91.3 91.7  ?PLT 67*  --  76*  --  90* 115*  ? < > = values in this interval not displayed.  ? ? ?Medications:   ? ? amLODipine  10 mg Oral Daily  ? aspirin EC  81 mg Oral Daily  ? chlorhexidine gluconate (MEDLINE KIT)  15 mL Mouth Rinse BID  ? [START ON 03/04/2022] Chlorhexidine Gluconate Cloth  6 each Topical Q0600  ? feeding supplement  237 mL Oral TID BM  ? folic acid  1 mg Oral Daily  ? guaiFENesin  600 mg Oral BID  ? heparin injection (subcutaneous)  5,000 Units Subcutaneous Q8H  ? insulin aspart  0-15 Units Subcutaneous Q4H  ? mouth rinse  15 mL Mouth Rinse BID  ? sodium chloride flush  10-40 mL Intracatheter Q12H  ? ? ? ?[name] ? ?03/03/2022, 11:23 AM   ?

## 2022-03-03 NOTE — Progress Notes (Signed)
PHARMACY NOTE:  ANTIMICROBIAL RENAL DOSAGE ADJUSTMENT ? ?Current antimicrobial regimen includes a mismatch between antimicrobial dosage and estimated renal function.  As per policy approved by the Pharmacy & Therapeutics and Medical Executive Committees, the antimicrobial dosage will be adjusted accordingly. ? ?Current antimicrobial dosage:  Zosyn 3.375g IV every 6 hours (while on CRRT) ? ?Indication: Asp PNA + Scrotal cellulitis ? ?Renal Function: ? ?Estimated Creatinine Clearance: 46.1 mL/min (A) (by C-G formula based on SCr of 2.19 mg/dL (H)). ?'[]'$      On intermittent HD, scheduled: ?'[]'$      On CRRT ?   ?Antimicrobial dosage has been changed to:  Zosyn 2.25g IV every 8 hours ? ?Additional comments: ?CRRT stopped this afternoon around 1400 and remains anuric (<50 cc/day of UOP) ? ?Thank you for allowing pharmacy to be a part of this patient?s care. ? ?Alycia Rossetti, PharmD, BCPS ?Infectious Diseases Clinical Pharmacist ?03/03/2022 4:08 PM  ? ?**Pharmacist phone directory can now be found on [email or web address] ([email or web address]).  Listed under [place].  ?

## 2022-03-03 NOTE — Progress Notes (Signed)
?  Subjective: ?The patient is complaining of scrotal discomfort this morning.  Tolerating extubation well.  Afebrile over the past shift.  WBC rising.  ? ?Objective: ?Vital signs in last 24 hours: ?Temp:  [98.2 ?F (36.8 ?C)-99.6 ?F (37.6 ?C)] 98.6 ?F (37 ?C) (04/11 3335) ?Pulse Rate:  [92-120] 98 (04/11 1000) ?Resp:  [12-26] 20 (04/11 1000) ?BP: (90-229)/(46-75) 98/59 (04/11 1000) ?SpO2:  [92 %-100 %] 100 % (04/11 1000) ?Weight:  [117.6 kg] 117.6 kg (04/11 0500) ? ?Intake/Output from previous day: ?04/10 0701 - 04/11 0700 ?In: 1605.5 [P.O.:607; I.V.:633.5; NG/GT:165; IV Piggyback:200] ?Out: 4562 [Urine:25; BWLSL:373] ? ?Intake/Output this shift: ?Total I/O ?In: 207.6 [P.O.:120; I.V.:87.6] ?Out: 515 [Other:515] ? ?Physical Exam:  ?General: Alert and oriented ?GU: Diffuse peno-scrotal edema without signs of spreading erythema, cellulitis, crepitus or necrosis.  The scrotum has a greenish blue appearance from prior methylene blue administration with areas of skin sloughing involving the anterior portion of the scrotum  Foley in place and draining blue urine.  ? ? ?Lab Results: ?Recent Labs  ?  03/02/22 ?0134 03/02/22 ?0305 03/03/22 ?0326  ?HGB 9.2* 8.6* 8.3*  ?HCT 27.0* 26.3* 25.5*  ? ?BMET ?Recent Labs  ?  03/02/22 ?1556 03/03/22 ?0326  ?NA 134* 136  ?K 3.8 3.5  ?CL 100 100  ?CO2 23 27  ?GLUCOSE 125* 100*  ?BUN 49* 43*  ?CREATININE 2.20* 2.19*  ?CALCIUM 8.1* 8.3*  ? ? ? ?Studies/Results: ?No results found. ? ?Assessment/Plan: ?61 year old male with severe sepsis and diffuse scrotal edema ? ?-Stable GU exam.  Scrotal edema will persist until the patient can be safely diuresed and ambulate.  Will continue to monitor  ? ? LOS: 9 days  ? ?Ellison Hughs, MD ?[facility] ?Pager: [phone] ? ?03/03/2022, 11:13 AM  ? ?

## 2022-03-03 NOTE — Consult Note (Addendum)
WOC Nurse Consult Note: ?Reason for Consult: Consult requested for bilat feet and toes. Performed remotely after review of progress notes and photos in the EMR.  Pt has extensive dark purple mottled areas which are high risk to evolve into eschar and full thickness tissue loss.  This is beyond the scope of practice for [facility] nurses and topical treatment will be minimally effective.  Secure chat message sent to primary team to request a vascular consult for further plan of care. ?Please re-consult if further assistance is needed.  Thank-you,  ?Julien Girt MSN, RN, [place], [place], CNS ?[phone]  ?

## 2022-03-03 NOTE — Significant Event (Signed)
Wife, Quillan Whitter, updated at bedside on events of past 24 hours and plan for the day. ? ?Redmond School., MSN, APRN, AGACNP-BC ?[facility]  ?03/03/2022 , 10:12 AM ? ?Please see [email or web address] for pager details ? ?If no response, please call [phone] ?After hours, please call Elink at [phone] ? ?

## 2022-03-03 NOTE — Progress Notes (Addendum)
ANTICOAGULATION CONSULT NOTE ? ?Pharmacy Consult for heparin > bivalirudin > SQ heparin ?Indication: atrial fibrillation, VTE prophylaxis ? ?Allergies  ?Allergen Reactions  ? Codeine Hives  ?  Insomnia, jittery  ? Flomax [Tamsulosin Hcl] Nausea Only  ?  Nausea and fatigue  ? ? ?Patient Measurements: ?Height: '5\' 10"'$  (177.8 cm) ?Weight: 117.6 kg (259 lb 4.2 oz) ?IBW/kg (Calculated) : 73 ?Heparin Dosing Weight: 95 kg ? ?Vital Signs: ?Temp: 98.6 ?F (37 ?C) (04/11 8875) ?Temp Source: Bladder (04/11 0400) ?BP: 122/71 (04/11 0715) ?Pulse Rate: 105 (04/11 0715) ? ?Labs: ?Recent Labs  ?  03/01/22 ?0308 03/01/22 ?1552 03/02/22 ?0134 03/02/22 ?0305 03/02/22 ?1556 03/03/22 ?0326  ?HGB 8.2*  --  9.2* 8.6*  --  8.3*  ?HCT 25.9*  --  27.0* 26.3*  --  25.5*  ?PLT 76*  --   --  90*  --  115*  ?APTT 55*   < >  --  63* 65* 66*  ?CREATININE 2.56*   < >  --  2.28* 2.20* 2.19*  ? < > = values in this interval not displayed.  ? ? ? ?Estimated Creatinine Clearance: 46.1 mL/min (A) (by C-G formula based on SCr of 2.19 mg/dL (H)). ? ? ?Assessment:  ?61 y.o. male with new onset Afib RVR, likely secondary to sepsis.  Urology seeing for hematuria.  DIC panel on 4/4 showed elevated aPTT at >200 s and elevated INR at 2.2. Stat heparin level ordered afterwards  and resulted subtherapeutic (0.24). Discussed with CCM and this is likely due to sepsis. Repeat DIC panel 4/6 still negative, AT3 ordered and 36 indicating AT3 deficiency. Patient was transitioned from heparin gtt to bivalirudin gtt after dialysis catheter insertion on 4/6.  ? ?Discussed with CCM and Vascular need for continued bivalirudin gtt given CHADSVASC score of 1. Patient to transition to ASA 81 mg daily. Will obtain AT3 lab to determine need for bivalirudin gtt for VTE prophylaxis. ? ?aPTT 66 seconds and therapeutic on bivalirudin 0.06 mg/kg/hr. Level drawn appropriately. No signs of bleeding noted per RN.  ? ?Plan: ?Continue bivalirudin 0.06 mg/kg/hr (use total body  weight) ?Continue to check aPTT q12h ?F/u AT3 level for guidance on VTE prophylaxis ?Check CBC daily  ?Monitor for s/s of bleeding  ? ?Thank you for involving pharmacy in this patient's care. ? ?Elita Quick, PharmD ?PGY1 Ambulatory Care Pharmacy Resident ?03/03/2022 10:25 AM ? ?**Pharmacist phone directory can be found on [email or web address] listed under [place]** ? ?ADDENDUM: ?AT3 resulted and is 111 and within normal limits. Will discontinue bivalirudin gtt and transition patient to SQ heparin for VTE prophylaxis.  ? ?Plan: ?Stop bivalirudin gtt ?Start SQ heparin 5000 units q8h ?Monitor for signs of bleeding ?Pharmacy will monitor peripherally ? ?Thank you for involving pharmacy in this patient's care. ? ?Elita Quick, PharmD ?PGY1 Ambulatory Care Pharmacy Resident ?03/03/2022 11:26 AM ? ?**Pharmacist phone directory can be found on [email or web address] listed under [place]* ? ? ?

## 2022-03-03 NOTE — Consult Note (Addendum)
?Hospital Consult ? ?VASCULAR SURGERY ASSESSMENT & PLAN:  ? ?ISCHEMIC CHANGES TO BOTH FEET: Patient has ischemic changes to both feet as documented below likely related to being on multiple pressors for septic shock.  In addition the discoloration apparently worsened with the methylene blue.  Regardless based on his physical exam he appears to have reasonable circulation in both feet.  He has palpable femoral and popliteal pulses bilaterally with multiphasic signals in both feet except for the right dorsalis pedis which is monophasic.  Now that he is off pressors I have ordered follow-up ABIs.  We will continue to follow this and let the wounds demarcate.  Hopefully there will be gradual improvement.  Worst-case scenario, he would require a more proximal amputation bilaterally.  I discussed this with the patient's wife at the bedside.  Vascular surgery will follow. ? ?Gae Gallop, MD ?4:06 PM ? ? ?Reason for Consult:  bilateral foott wounds ?Requesting Physician:  Iona Beard, NP ?MRN #:  [id number] ? ?History of Present Illness: This is a 61 y.o. male who presented to the hospital a little over a week ago with N/V/D.  He was intubated on 02/23/2022, had a brief cardiac arrest and required pressors and was started on methylene blue.  He also had CT of the chest that was negative for acute process.  CT a/p was notable for swollen scrotum concerning for infection.   ? ?He has required CRRT for AKI and his creatinine today is 2.19 (was as high as 6.87 on 02/26/2022).  His renal function improving.  He did have thrombocytopenia as low as 40k and now 115k.  He was extubated yesterday and now on room air.  He is not on pressors as they were weaned in the last 24-48 hours.  Pharmacy states that since the methylene blue was started, he had more discoloration of his feet.   ? ?Upon questioning, the pt denies any claudication or rest pain.  He denies any hx of HTN, HLD, DM.  He is not a smoker.  He is currently on Zosyn.   ? ?Prior  to admission, pt was not on statin, asa or antihypertensive medication. ? ?VVS is asked to evaluate pt bilateral foot wounds.   ? ?Past Medical History:  ?Diagnosis Date  ? Concussion   ? Eosinophilic esophagitis   ? Gastric ulcer   ? History of kidney stones   ? Migraine   ? ? ?Past Surgical History:  ?Procedure Laterality Date  ? CYSTOSCOPY    ? DENTAL SURGERY    ? FINGER SURGERY    ? HERNIA REPAIR    ? at age 59  ? KNEE ARTHROSCOPY Left   ? LITHOTRIPSY    ? NEPHROLITHOTOMY    ? TONSILLECTOMY    ? TOTAL KNEE ARTHROPLASTY Bilateral 11/04/2018  ? Procedure: BILATERAL TOTAL KNEE ARTHROPLASTY;  Surgeon: [name], MD;  Location: [facility];  Service: Orthopedics;  Laterality: Bilateral;  spinal/epidural per anesthesiologist  ? ? ?Allergies  ?Allergen Reactions  ? Codeine Hives  ?  Insomnia, jittery  ? Flomax [Tamsulosin Hcl] Nausea Only  ?  Nausea and fatigue  ? ? ?Prior to Admission medications   ?Medication Sig Start Date End Date Taking? Authorizing Provider  ?acetaminophen (TYLENOL) 650 MG CR tablet Take 650 mg by mouth every 8 (eight) hours as needed for pain or fever.   Yes [provider]  ?Calcium Carbonate-Vitamin D (OSCAL 500 D-3 PO) Take 1 tablet by mouth daily.   Yes [provider]  ?  naproxen sodium (ALEVE) 220 MG tablet Take 220 mg by mouth daily as needed (pain.).   Yes [provider]  ?OVER THE COUNTER MEDICATION Take 1 capsule by mouth daily. Prostate vitamin   Yes [provider]  ?allopurinol (ZYLOPRIM) 100 MG tablet Take 1 tablet (100 mg total) by mouth daily. ?Patient not taking: Reported on 02/22/2022 11/14/18   Rayburn, Neta Mends, PA-C  ?baclofen (LIORESAL) 10 MG tablet TAKE 1 TABLET BY MOUTH THREE TIMES A DAY AS NEEDED ?Patient not taking: Reported on 02/22/2022 09/02/20   Yetta Flock, MD  ?colchicine-probenecid 0.5-500 MG tablet Take 1 tablet by mouth 2 (two) times daily. ?Patient not taking: Reported on 02/22/2022 11/14/18   Rayburn, Neta Mends, PA-C  ?fluticasone (FLOVENT HFA) 220 MCG/ACT inhaler Inhale 2 puffs into the lungs in the morning and at bedtime. Do not eat or drink 30 minutes after inhalation ?Patient not taking: Reported on 02/22/2022 02/12/20   Yetta Flock, MD  ?pantoprazole (PROTONIX) 40 MG tablet TAKE 1 TABLET BY MOUTH TWICE A DAY ?Patient not taking: Reported on 02/22/2022 07/01/20   Armbruster, Carlota Raspberry, MD  ? ? ?Social History  ? ?Socioeconomic History  ? Marital status: Married  ?  Spouse name: Not on file  ? Number of children: Not on file  ? Years of education: Not on file  ? Highest education level: Not on file  ?Occupational History  ? Not on file  ?Tobacco Use  ? Smoking status: Never  ? Smokeless tobacco: Never  ?Vaping Use  ? Vaping Use: Never used  ?Substance and Sexual Activity  ? Alcohol use: Yes  ?  Comment: occassional  ? Drug use: No  ? Sexual activity: Not on file  ?Other Topics Concern  ? Not on file  ?Social History Narrative  ? Not on file  ? ?Social Determinants of Health  ? ?Financial Resource Strain: Not on file  ?Food Insecurity: Not on file  ?Transportation Needs: Not on file  ?Physical Activity: Not on file  ?Stress: Not on file  ?Social Connections: Not on file  ?Intimate Partner Violence: Not on file  ? ? ?Family History  ?Problem Relation Age of Onset  ? COPD Mother   ? Sarcoidosis Brother   ? Cancer Brother   ? Stomach cancer Neg Hx   ? Pancreatic cancer Neg Hx   ? Esophageal cancer Neg Hx   ? Colon cancer Neg Hx   ? Rectal cancer Neg Hx   ? ? ?ROS: '[x]'$  Positive   '[ ]'$  Negative   '[ ]'$  All sytems reviewed and are negative ? ?Cardiac: ?'[x]'$  afib  ? ?Vascular: ?'[]'$  pain in legs while walking ?'[]'$  pain in legs at rest ?'[]'$  pain in legs at night ?'[]'$  non-healing ulcers ? ?Pulmonary: ?'[]'$  asthma/wheezing ?'[]'$  home O2 ? ?Neurologic: ?'[]'$  hx of CVA '[]'$  mini stroke ? ? ?Hematologic: ?'[]'$  hx of cancer ? ?Endocrine:  ? '[]'$  diabetes '[]'$  thyroid disease ? ?GI ?'[x]'$  gastric ulcer ?'[x]'$  shock liver ? ?GU: ?'[x]'$  AKI ?'[x]'$  scrotal  cellulitis ? ?Psychiatric: ?'[]'$  anxiety ?'[]'$  depression ? ?Musculoskeletal: ?'[]'$  arthritis ?'[]'$  joint pain ? ?Integumentary: ?'[]'$  rashes '[]'$  ulcers ? ?Constitutional: ?'[]'$  fever  ?'[]'$  chills ? ?Physical Examination ? ?Vitals:  ? 03/03/22 0715 03/03/22 0722  ?BP: 122/71   ?Pulse: (!) 105   ?Resp: (!) 23   ?Temp:  98.6 ?F (37 ?C)  ?SpO2: 100%   ? ?Body mass index is 37.2 kg/m?. ? ?General:  WDWN in NAD ?Gait:  Not observed ?HENT: WNL, normocephalic ?Pulmonary: normal non-labored breathing on room air (extubated yesterday) ?Cardiac: irregular, without Murmur; without carotid bruits ?Abdomen:  soft, NT/ND ?Skin: without rashes ?Vascular Exam/Pulses: ?Pt with palpable bilateral femoral, popliteal pulses.  His pedal pulses are not palpable, but he does have multiphasic doppler signals in left DP/PT, right AT/PT and monophasic right DP ?Extremities:  ? ?Right foot ? ?Right foot ? ?Left foot ? ?Left foot ? ? ?Musculoskeletal: no muscle wasting or atrophy  ?Neurologic: A&O X 3; speech is fluent/normal ?Psychiatric:  The pt has Normal affect. ? ? ?CBC ?   ?Component Value Date/Time  ? WBC 13.3 (H) 03/03/2022 0326  ? RBC 2.78 (L) 03/03/2022 0326  ? HGB 8.3 (L) 03/03/2022 0326  ? HCT 25.5 (L) 03/03/2022 0326  ? PLT 115 (L) 03/03/2022 0326  ? MCV 91.7 03/03/2022 0326  ? MCV 92.8 04/10/2014 0900  ? MCH 29.9 03/03/2022 0326  ? MCHC 32.5 03/03/2022 0326  ? RDW 16.4 (H) 03/03/2022 0326  ? LYMPHSABS 0.1 (L) 02/22/2022 1805  ? MONOABS 0.0 (L) 02/22/2022 1805  ? EOSABS 0.0 02/22/2022 1805  ? BASOSABS 0.0 02/22/2022 1805  ? ? ?BMET ?   ?Component Value Date/Time  ? NA 136 03/03/2022 0326  ? K 3.5 03/03/2022 0326  ? CL 100 03/03/2022 0326  ? CO2 27 03/03/2022 0326  ? GLUCOSE 100 (H) 03/03/2022 0326  ? BUN 43 (H) 03/03/2022 0326  ? CREATININE 2.19 (H) 03/03/2022 0326  ? CREATININE 0.99 04/10/2014 0852  ? CALCIUM 8.3 (L) 03/03/2022 0326  ? GFRNONAA 34 (L) 03/03/2022 0326  ? GFRAA >60 09/14/2019 1808  ? ? ?COAGS: ?Lab Results  ?Component Value Date   ? INR 1.3 (H) 02/26/2022  ? INR 2.2 (H) 02/24/2022  ? INR 1.3 (H) 02/22/2022  ? ? ? ?Non-Invasive Vascular Imaging:   ?ABI on 02/24/2022: ?------+-----------+-----------+------------+------------+  ?ABI/TBITo

## 2022-03-03 NOTE — Progress Notes (Signed)
Inpatient Rehab Admissions Coordinator:  ? ?Per therapy recommendations patient was screened for CIR candidacy by Clemens Catholic, MS, CCC-SLP. At this time, Pt. is not at a level where I think he could tolerate the intensity of CIR. He also remains on CRRT, so not medically ready for Cir at this time.   Pt. may have potential to progress to becoming a potential CIR candidate, so CIR admissions team will follow and monitor for progress and participation with therapies and place consult order if Pt. appears to be an appropriate candidate. Please contact me with any questions.  ? ?Clemens Catholic, MS, CCC-SLP ?Rehab Admissions Coordinator  ?[phone] (celll) ?[phone] (office) ? ?

## 2022-03-03 NOTE — Progress Notes (Signed)
? ? ?NAME:  Jeffrey Ayala, MRN:  [id number], DOB:  03/25/1961, LOS: 50 ?ADMISSION DATE:  02/22/2022, CONSULTATION DATE:  4/2 ?REFERRING MD:  Doren Custard- ED CHIEF COMPLAINT: Septic shock ? ?History of Present Illness:  ?Jeffrey Ayala, is a 61 y.o. male, who presented to the [facility] ED with a chief complaint of nausea, vomiting, diarrhea. ? ?They have a pertinent past medical history of gastric ulcer, kidney stones, eosinophilic esophagitis, migraines  ? ?History obtained from Onis Markoff (spouse), on 4/1 patient began complaining of feeling unwell. Wife denies sick contacts, questionable food.  Wife denies taking any meds currently.  Around 2 PM on 4/2 the patient developed nausea, vomiting, and diarrhea while at a birthday party and soiled himself. EMS was called. When EMS arrived he was found to be in A-fib with a rate of 120-160. ? ?At the ED he was found to be febrile with a Tmax of 102.9.  A code sepsis was called in the emergency department, blood cultures were drawn, 2 L of IV fluid were given, vanc and cefepime were ordered.  CT chest negative for acute process.  CT abdomen pelvis with and without contrast notable for swollen scrotum concerning for infection, colon filled with diarrhea.  Urology and cardiology were consulted by the ED.  Despite fluid resuscitation the patient developed hypotension requiring vasopressors. ? ?PCCM was consulted for admission. ? ?Pertinent  Medical History  ?gastric ulcer, kidney stones, eosinophilic esophagitis, migraines  ? ?Significant Hospital Events: ?Including procedures, antibiotic start and stop dates in addition to other pertinent events   ?4/2 Presented to the ED. CT chest negative for acute process.  CT abdomen pelvis with and without contrast notable for swollen scrotum concerning for infection, colon filled with diarrhea.  Urology and cardiology were consulted by the ED. Vanc, cefepime started.  ?4/3 Intubated overnight, brief cardiac arrest. Escalating pressors. ?4/5  started on methylene blue ?4/6 started on CRRT ?4/8 appears more responsive ?4/9 responsive ?4/10 Extubated, PT rec IPR ? ? ?Interim History / Subjective:  ?Past 24 hours: CRRT ongoing, extubated, PT rec IPR ? ?Tmax 99.6, documented blood pressure 90/52-229/69 ? ?On room air ? ?+11 L admission, -3.5 L past 24 hours, 300 stool, 10 mL urine past 24 hours ? ?Subjective: no complaints. Denies chest pain and SOB. ?Objective   ?Blood pressure 122/71, pulse (!) 105, temperature 98.6 ?F (37 ?C), resp. rate (!) 23, height '5\' 10"'$  (1.778 m), weight 117.6 kg, SpO2 100 %.  ?   ?   ? ?Intake/Output Summary (Last 24 hours) at 03/03/2022 0826 ?Last data filed at 03/03/2022 0800 ?Gross per 24 hour  ?Intake 1604.49 ml  ?Output 5175 ml  ?Net -3570.51 ml  ? ?Filed Weights  ? 03/01/22 0315 03/02/22 0500 03/03/22 0500  ?Weight: 130.1 kg 128.7 kg 117.6 kg  ? ? ?Examination: ?Physical exam: ?General: In bed, NAD, appears comfortable ?HEENT: MM pink/moist, anicteric, atraumatic ?Neuro: RASS 0, PERRL 67m, GCS 15 ?CV: S1S2, Afib, no m/r/g appreciated ?PULM:  clear in the upper lobes, clear in the lower lobes, trachea midline, chest expansion symmetric ?GI: soft, bsx4 active, non-tender   ?Extremities: warm/dry, generalized edema, capillary refill less than 3 seconds  ?Skin: See photos ? ? ? ? ? ? ? ? ? ? ? ?Pertinent labs and imaging: ?BUN 49> 43, creatinine 2.2> 2.19 ?Magnesium 2.7 ?PTT 66 ?WBC 10.4> 13.3 ?Hemoglobin 8.6> 8.3 ?Platelets 90>115 ?Blood glucose 55-125 ? ? ?Assessment & Plan:  ? ?Septic shock, improved ?Sources could be scrotal cellulitis  versus right lower lobe aspiration pneumonia ?Patient remains off vasopressors. Cultures remain negative ?-Continue IV Zosyn dicussed with pharmacy, today day 7 of zosyn, considering continuing for 10 days. ?-Goal MAP 60-65 ?-Follow fever WBC/Curve ? ?Scrotal edema with possible subcu infection ?Repeat CT scan remained stable, no signs of gas, remains edematous. Complaints of 9/10 scrotal pain  overnight. ?-Urology following feels that this is not source of infection ?-Increasing PRN oxy to '10mg'$  q4h, added PRN hydromorphone 0.25 mg q4h for breakthrough pain, continue PRN apap ? ?Persistent atrial fibrillation with RVR ?Likely secondary to sepsis. Rate remains controlled ?-Continue PRN metoprolol  ?-Continue bival. ? ?Acute hypoxemic respiratory failure require mechanical ventilation, likely due to aspiration pneumonia- improved ?Extubated on 4/10. Now on room air ?-Pulm toilet as able ?-Mobilize with PT ?-Continue zosyn ? ?Acute kidney injury due to ischemic ATN from septic shock ?31m UOP documented overnight. BUN 49> 43, creatinine 2.2> 2.19 ?-Continue CRRT. Neph plans to transition off CRRT and to HD in next 24-480 hours. Appreciate assistance. ?-Ensure renal perfusion. Goal MAP 65 or greater. ?-Avoid neprotoxic drugs as possible. ?-Strict I&O's ?-Follow up AM creatinine ? ?Shock liver ?Bilirubinemia  ?Suspect secondary to shock liver from sepsis ?-follow LFTs ?-Continue supportive care ? ?Bilateral foot wounds ?Suspect secondary to septic shock, high dose vasopressors ?-WOC consult ? ?Acute septic/metabolic encephalopathy- resolved ? ?Gastric ulcer ?-ppi, will stop once tolerating diet ? ? ?Best Practice (right click and "Reselect all SmartList Selections" daily)  ? ?Diet/type: Regular consistency (see orders) ?DVT prophylaxis: on bivalirudin due to unable to achieve therapeutic dosing on heparin and high risk for clotting.  ?GI prophylaxis: PPI ?Lines: Central line, Arterial Line, and yes and it is still needed ?Foley:  Yes, and it is still needed ?Code Status:  full code ?Last date of multidisciplinary goals of care discussion [4/10 please see plan of care note ? ? ?Total critical care time: 39 minutes ? ?TRedmond School, MSN, APRN, AGACNP-BC ?[facility]  ?03/03/2022 , 8:26 AM ? ?Please see [email or web address] for pager details ? ?If no response, please call [phone] ?After  hours, please call Elink at [phone]? ? ? ?

## 2022-03-04 ENCOUNTER — Inpatient Hospital Stay (HOSPITAL_COMMUNITY): Payer: BC Managed Care – PPO

## 2022-03-04 DIAGNOSIS — A419 Sepsis, unspecified organism: Secondary | ICD-10-CM | POA: Diagnosis not present

## 2022-03-04 DIAGNOSIS — L8990 Pressure ulcer of unspecified site, unspecified stage: Secondary | ICD-10-CM

## 2022-03-04 DIAGNOSIS — I998 Other disorder of circulatory system: Secondary | ICD-10-CM | POA: Diagnosis not present

## 2022-03-04 DIAGNOSIS — R6521 Severe sepsis with septic shock: Secondary | ICD-10-CM | POA: Diagnosis not present

## 2022-03-04 LAB — CBC
HCT: 23.7 % — ABNORMAL LOW (ref 39.0–52.0)
Hemoglobin: 7.6 g/dL — ABNORMAL LOW (ref 13.0–17.0)
MCH: 29.5 pg (ref 26.0–34.0)
MCHC: 32.1 g/dL (ref 30.0–36.0)
MCV: 91.9 fL (ref 80.0–100.0)
Platelets: 133 10*3/uL — ABNORMAL LOW (ref 150–400)
RBC: 2.58 MIL/uL — ABNORMAL LOW (ref 4.22–5.81)
RDW: 16.6 % — ABNORMAL HIGH (ref 11.5–15.5)
WBC: 12.6 10*3/uL — ABNORMAL HIGH (ref 4.0–10.5)
nRBC: 0.2 % (ref 0.0–0.2)

## 2022-03-04 LAB — RENAL FUNCTION PANEL
Albumin: 2.1 g/dL — ABNORMAL LOW (ref 3.5–5.0)
Anion gap: 11 (ref 5–15)
BUN: 59 mg/dL — ABNORMAL HIGH (ref 6–20)
CO2: 23 mmol/L (ref 22–32)
Calcium: 8.1 mg/dL — ABNORMAL LOW (ref 8.9–10.3)
Chloride: 101 mmol/L (ref 98–111)
Creatinine, Ser: 3.73 mg/dL — ABNORMAL HIGH (ref 0.61–1.24)
GFR, Estimated: 18 mL/min — ABNORMAL LOW (ref >60)
Glucose, Bld: 116 mg/dL — ABNORMAL HIGH (ref 70–99)
Phosphorus: 6.8 mg/dL — ABNORMAL HIGH (ref 2.5–4.6)
Potassium: 3.5 mmol/L (ref 3.5–5.1)
Sodium: 135 mmol/L (ref 135–145)

## 2022-03-04 LAB — GLUCOSE, CAPILLARY
Glucose-Capillary: 104 mg/dL — ABNORMAL HIGH (ref 70–99)
Glucose-Capillary: 173 mg/dL — ABNORMAL HIGH (ref 70–99)
Glucose-Capillary: 122 mg/dL — ABNORMAL HIGH (ref 70–99)
Glucose-Capillary: 104 mg/dL — ABNORMAL HIGH (ref 70–99)
Glucose-Capillary: 127 mg/dL — ABNORMAL HIGH (ref 70–99)
Glucose-Capillary: 123 mg/dL — ABNORMAL HIGH (ref 70–99)

## 2022-03-04 LAB — PHOSPHORUS: Phosphorus: 6.6 mg/dL — ABNORMAL HIGH (ref 2.5–4.6)

## 2022-03-04 LAB — HEPATITIS B SURFACE ANTIGEN: Hepatitis B Surface Ag: NONREACTIVE

## 2022-03-04 LAB — HEPATITIS B SURFACE ANTIBODY,QUALITATIVE: Hep B S Ab: NONREACTIVE

## 2022-03-04 LAB — MAGNESIUM: Magnesium: 3 mg/dL — ABNORMAL HIGH (ref 1.7–2.4)

## 2022-03-04 MED ORDER — SODIUM CHLORIDE 0.9 % IV SOLN: 100.0000 mL | INTRAVENOUS | Status: DC | PRN

## 2022-03-04 MED ORDER — ENSURE ENLIVE PO LIQD
237.0000 mL | Freq: Four times a day (QID) | ORAL | Status: DC
  Administered 2022-03-04 – 2022-03-13 (×22): 237 mL via ORAL

## 2022-03-04 MED ORDER — LIDOCAINE-PRILOCAINE 2.5-2.5 % EX CREA
1.0000 "application " | TOPICAL_CREAM | CUTANEOUS | Status: DC | PRN
  Filled 2022-03-04: qty 5

## 2022-03-04 MED ORDER — HEPARIN SODIUM (PORCINE) 1000 UNIT/ML DIALYSIS: 1000.0000 [IU] | INTRAMUSCULAR | Status: DC | PRN

## 2022-03-04 MED ORDER — INSULIN ASPART 100 UNIT/ML IJ SOLN: 0.0000 [IU] | Freq: Every day | INTRAMUSCULAR | Status: DC

## 2022-03-04 MED ORDER — HEPARIN SODIUM (PORCINE) 1000 UNIT/ML DIALYSIS
100.0000 [IU]/kg | INTRAMUSCULAR | Status: DC | PRN
  Filled 2022-03-04: qty 12

## 2022-03-04 MED ORDER — PENTAFLUOROPROP-TETRAFLUOROETH EX AERO: 1.0000 "application " | INHALATION_SPRAY | CUTANEOUS | Status: DC | PRN

## 2022-03-04 MED ORDER — INSULIN ASPART 100 UNIT/ML IJ SOLN
0.0000 [IU] | Freq: Three times a day (TID) | INTRAMUSCULAR | Status: DC
  Administered 2022-03-05 – 2022-03-08 (×2): 2 [IU] via SUBCUTANEOUS

## 2022-03-04 MED ORDER — LIDOCAINE HCL (PF) 1 % IJ SOLN: 5.0000 mL | INTRAMUSCULAR | Status: DC | PRN

## 2022-03-04 MED ORDER — ALTEPLASE 2 MG IJ SOLR: 2.0000 mg | Freq: Once | INTRAMUSCULAR | Status: DC | PRN

## 2022-03-04 NOTE — Progress Notes (Signed)
Carotid duplex has been completed.  ? ?Preliminary results in CV Proc.  ? ?Jeffrey Ayala ?03/04/2022 3:04 PM    ?

## 2022-03-04 NOTE — Progress Notes (Signed)
?[facility] ?Progress Note  ? ? ?Assessment/ Plan:   ?AKI: in the setting of severe septic shock likely ATN.  Normal baseline ?            - started CRRT 02/26/22 ? - change to 2K bath 02/28/22 with K slightly higher, systemic hep gtt changed to bivalirudin ?            -Patient now off CRRT on 4/11 ? -Plan for hemodialysis today and likely tomorrow for help with volume removal.  Likely maintain TTS schedule thereafter ? -Monitor for signs of renal recovery ?  ?  ?2.  Septic shock ?            - severe ?            -Now off pressors ? - abx per primary ?            -Source is not definitively clear.  Ongoing fevers ?  ?3.  Scrotal Edema ?            - urology following.  Urology feels this is unlikely the source of his infection ?  ?4.  Acute hypoxic RF ?            - Now extubated significantly improved.  Continue to monitor ? ?5.  Thrombocytopenia/ coagulopathy: abd [place] with cirrhosis, elevated Tbili no schistocytes on smear, likely sepsis related (? Shock liver).  Management per primary thrombocytopenia improving ? ?6.  Anemia: Hemoglobin decreasing now 7.6.  Transfuse as needed.  Management per primary team ? ? ?7.  Afib with RVR- hep--> bivalirudin gtt ? ?8.  Foot wounds: associated with high-dose vasopressor requirements. Vascular surgery consulted ?  ?9. Dispo: ICU ? ?Subjective:   ? ?Urine output remains minimal.  Off of CRRT yesterday.  Planning for dialysis today.  Patient has no specific complaints  ? ?Objective:   ?BP 127/66   Pulse (!) 104   Temp 99.5 ?F (37.5 ?C) (Bladder)   Resp 16   Ht '5\' 10"'  (1.778 m)   Wt 114.7 kg   SpO2 99%   BMI 36.28 kg/m?  ? ?Intake/Output Summary (Last 24 hours) at 03/04/2022 0948 ?Last data filed at 03/04/2022 0600 ?Gross per 24 hour  ?Intake 1128.6 ml  ?Output 1140 ml  ?Net -11.4 ml  ? ?Weight change: -2.9 kg ? ?Physical Exam: ?KXF:GHWEX, alert, no distress ?HEENT eyes opened, + icteric  ?PULM coarse upper airway sounds, On nasal cannula ?CV Normal rate, no  audible rub ?ABD distended, still soft, bowel sounds present ?GU: swollen scrotum with deeply violaceous discoloration with some weeping ?EXT 2 + anasarca, mottled and cool bilateral soles of feet and toes, + jaundiced ?NEURO Awake, alert, oriented to person and place ? ?Imaging: ?No results found. ? ?Labs: ?BMET ?Recent Labs  ?Lab 03/01/22 ?0308 03/01/22 ?1552 03/02/22 ?0134 03/02/22 ?0305 03/02/22 ?1556 03/03/22 ?0326 03/03/22 ?1639 03/04/22 ?0316  ?NA 136 136 136 135 134* 136 134* 135  ?K 4.3 4.6 4.0 3.8 3.8 3.5 3.4* 3.5  ?CL 102 102  --  100 100 100 99 101  ?CO2 24 23  --  '23 23 27 24 23  ' ?GLUCOSE 143* 119*  --  127* 125* 100* 113* 116*  ?BUN 64* 60*  --  53* 49* 43* 47* 59*  ?CREATININE 2.56* 2.38*  --  2.28* 2.20* 2.19* 2.71* 3.73*  ?CALCIUM 8.0* 8.0*  --  8.2* 8.1* 8.3* 8.1* 8.1*  ?PHOS 5.5* 5.0*  --  5.1*  4.0 4.0 4.8* 6.8*  6.6*  ? ?CBC ?Recent Labs  ?Lab 03/01/22 ?0308 03/02/22 ?0134 03/02/22 ?1610 03/03/22 ?9604 03/04/22 ?5409  ?WBC 10.0  --  10.4 13.3* 12.6*  ?HGB 8.2* 9.2* 8.6* 8.3* 7.6*  ?HCT 25.9* 27.0* 26.3* 25.5* 23.7*  ?MCV 92.5  --  91.3 91.7 91.9  ?PLT 76*  --  90* 115* 133*  ? ? ?Medications:   ? ? amLODipine  10 mg Oral Daily  ? aspirin EC  81 mg Oral Daily  ? chlorhexidine gluconate (MEDLINE KIT)  15 mL Mouth Rinse BID  ? Chlorhexidine Gluconate Cloth  6 each Topical Q0600  ? feeding supplement  237 mL Oral TID BM  ? folic acid  1 mg Oral Daily  ? guaiFENesin  600 mg Oral BID  ? heparin injection (subcutaneous)  5,000 Units Subcutaneous Q8H  ? insulin aspart  0-15 Units Subcutaneous Q4H  ? mouth rinse  15 mL Mouth Rinse BID  ? multivitamin  1 tablet Oral QHS  ? sodium chloride flush  10-40 mL Intracatheter Q12H  ? ? ? ?[name] ? ?03/04/2022, 9:48 AM   ?

## 2022-03-04 NOTE — Progress Notes (Signed)
? ?  VASCULAR SURGERY ASSESSMENT & PLAN:  ? ?BILATERAL LOWER EXTREMITY WOUNDS: The wounds on his feet are unchanged.  He has palpable femoral and popliteal pulses with multiphasic signals in both feet.  We will simply let the wounds demarcate.  I have ordered follow-up ABIs now that he is off pressors.  These are pending. ? ? ?SUBJECTIVE:  ? ?No complaints this morning. ? ?PHYSICAL EXAM:  ? ?Vitals:  ? 03/04/22 0400 03/04/22 0500 03/04/22 0600 03/04/22 0700  ?BP: (!) 126/58 127/66 124/67 109/61  ?Pulse: (!) 109 (!) 101 (!) 108 (!) 111  ?Resp: $Remov'16 16 19 16  'QIylcX$ ?Temp: 99.2 ?F (37.3 ?C)     ?TempSrc: Bladder     ?SpO2: 98% 98% 98% 97%  ?Weight:  114.7 kg    ?Height:      ? ?The wounds on his feet are unchanged. ? ?LABS:  ? ?Lab Results  ?Component Value Date  ? WBC 12.6 (H) 03/04/2022  ? HGB 7.6 (L) 03/04/2022  ? HCT 23.7 (L) 03/04/2022  ? MCV 91.9 03/04/2022  ? PLT 133 (L) 03/04/2022  ? ?Lab Results  ?Component Value Date  ? CREATININE 3.73 (H) 03/04/2022  ? ?Lab Results  ?Component Value Date  ? INR 1.3 (H) 02/26/2022  ? ?CBG (last 3)  ?Recent Labs  ?  03/03/22 ?1950 03/03/22 ?2330 03/04/22 ?0321  ?GLUCAP 107* 158* 104*  ? ? ?PROBLEM LIST:   ? ?Principal Problem: ?  Septic shock ([place]) ?Active Problems: ?  Pressure injury of skin ?  Encounter for central line placement ? ? ?CURRENT MEDS:  ? ? amLODipine  10 mg Oral Daily  ? aspirin EC  81 mg Oral Daily  ? chlorhexidine gluconate (MEDLINE KIT)  15 mL Mouth Rinse BID  ? Chlorhexidine Gluconate Cloth  6 each Topical Q0600  ? feeding supplement  237 mL Oral TID BM  ? folic acid  1 mg Oral Daily  ? guaiFENesin  600 mg Oral BID  ? heparin injection (subcutaneous)  5,000 Units Subcutaneous Q8H  ? insulin aspart  0-15 Units Subcutaneous Q4H  ? mouth rinse  15 mL Mouth Rinse BID  ? multivitamin  1 tablet Oral QHS  ? sodium chloride flush  10-40 mL Intracatheter Q12H  ? ? ?Deitra Mayo ?Office: [phone] ?03/04/2022 ? ?

## 2022-03-04 NOTE — Progress Notes (Signed)
Physical Therapy Treatment ?Patient Details ?Name: Jeffrey Ayala ?MRN: [id number] ?DOB: October 05, 1961 ?Today's Date: 03/04/2022 ? ? ?History of Present Illness Pt adm 4/2 c/o N/V/D and found to have septic shock due to scrotal cellulitis vs RLL PNA.  Pt intubated 4/3 and brief cardiac arrest post intubation. [name] started 4/5.  Pt with AKI and started on CRRT on 4/6. Pt extubated 4/10. Pt also with afib with rvr. PMH - Bil TKR, migraines ? ?  ?PT Comments  ? ? Pt admitted with above diagnosis. Pt tolerated bed in full chair position for therapy  for up to 10 min with pt actively sitting without back support as well as performing UE and LE exercises.  Left pt in chair position at end of treatment with pt at 60 degrees HOB.  Wife present and trying to keep pt awake. Pt currently with functional limitations due to balance and endurance deficits. Pt will benefit from skilled PT to increase their independence and safety with mobility to allow discharge to the venue listed below.      ?Recommendations for follow up therapy are one component of a multi-disciplinary discharge planning process, led by the attending physician.  Recommendations may be updated based on patient status, additional functional criteria and insurance authorization. ? ?Follow Up Recommendations ? Acute inpatient rehab (3hours/day) ?  ?  ?Assistance Recommended at Discharge Frequent or constant Supervision/Assistance  ?Patient can return home with the following Two people to help with walking and/or transfers;Two people to help with bathing/dressing/bathroom;Help with stairs or ramp for entrance;Assist for transportation ?  ?Equipment Recommendations ? Wheelchair (measurements PT);Wheelchair cushion (measurements PT);Hospital bed;Other (comment) (hoyer lift)  ?  ?Recommendations for Other Services   ? ? ?  ?Precautions / Restrictions Precautions ?Precautions: Fall ?Precaution Comments: swollen scrotum ?Restrictions ?Weight Bearing Restrictions:  No  ?  ? ?Mobility ? Bed Mobility ?Overal bed mobility: Needs Assistance ?Bed Mobility: Supine to Sit, Sit to Supine ?  ?  ?  ?  ?  ?General bed mobility comments: Progressively inclined bed to full chair position.  Pt was able to pull himself forward using rails and could sustain sitting up for up to 7 minutes.  Pt also kicked his LEs with delay in following this command. ?  ? ?Transfers ?  ?  ?  ?  ?  ?  ?  ?  ?  ?  ?  ? ?Ambulation/Gait ?  ?  ?  ?  ?  ?  ?  ?  ? ? ?Stairs ?  ?  ?  ?  ?  ? ? ?Wheelchair Mobility ?  ? ?Modified Rankin (Stroke Patients Only) ?  ? ? ?  ?Balance   ?  ?  ?  ?  ?  ?  ?  ?  ?  ?  ?  ?  ?  ?  ?  ?  ?  ?  ?  ? ?  ?Cognition Arousal/Alertness: Lethargic ?Behavior During Therapy: Flat affect ?Overall Cognitive Status: Impaired/Different from baseline ?Area of Impairment: Orientation, Attention, Memory, Following commands, Awareness, Problem solving ?  ?  ?  ?  ?  ?  ?  ?  ?Orientation Level: Disoriented to, Time (initally reports march) ?Current Attention Level: Focused ?Memory: Decreased short-term memory ?Following Commands: Follows one step commands consistently, Follows one step commands with increased time ?  ?Awareness: Emergent ?Problem Solving: Slow processing, Requires verbal cues, Difficulty sequencing, Decreased initiation, Requires tactile cues ?General Comments: pt with slow processing,  decreased initation and sequencing.  poor attention, anticipate limited by lethargy. ?  ?  ? ?  ?Exercises Other Exercises ?Other Exercises: PROM to BUEs, progressing to [facility] but limited by weakness. ?Other Exercises: LAQ x 10, QS x 10 ? ?  ?General Comments General comments (skin integrity, edema, etc.): VSS on RA ?  ?  ? ?Pertinent Vitals/Pain Pain Assessment ?Pain Assessment: Faces ?Faces Pain Scale: Hurts even more ?Pain Location: generalized ?Pain Descriptors / Indicators: Discomfort ?Pain Intervention(s): Limited activity within patient's tolerance, Monitored during session,  Repositioned  ? ? ?Home Living   ?  ?  ?  ?  ?  ?  ?  ?  ?  ?   ?  ?Prior Function    ?  ?  ?   ? ?PT Goals (current goals can now be found in the care plan section) Acute Rehab PT Goals ?Patient Stated Goal: not stated ?Progress towards PT goals: Progressing toward goals ? ?  ?Frequency ? ? ? Min 3X/week ? ? ? ?  ?PT Plan Current plan remains appropriate  ? ? ?Co-evaluation   ?  ?  ?  ?  ? ?  ?AM-PAC PT "6 Clicks" Mobility   ?Outcome Measure ? Help needed turning from your back to your side while in a flat bed without using bedrails?: Total ?Help needed moving from lying on your back to sitting on the side of a flat bed without using bedrails?: Total ?Help needed moving to and from a bed to a chair (including a wheelchair)?: Total ?Help needed standing up from a chair using your arms (e.g., wheelchair or bedside chair)?: Total ?Help needed to walk in hospital room?: Total ?Help needed climbing 3-5 steps with a railing? : Total ?6 Click Score: 6 ? ?  ?End of Session   ?Activity Tolerance: Patient limited by fatigue ?Patient left: in bed;with call bell/phone within reach;with bed alarm set;with family/visitor present ?Nurse Communication: Mobility status;Need for lift equipment (Left pt in chair position) ?PT Visit Diagnosis: Other abnormalities of gait and mobility (R26.89);Muscle weakness (generalized) (M62.81) ?  ? ? ?Time: 3086-5784 ?PT Time Calculation (min) (ACUTE ONLY): 23 min ? ?Charges:  $Therapeutic Exercise: 8-22 mins ?$Therapeutic Activity: 8-22 mins          ?          ? ?Chrystian Ressler M,PT ?Acute Rehab Services ?[phone] ?[phone] (pager)  ? ? ?Gavan Nordby F Dinna Severs ?03/04/2022, 1:10 PM ? ?

## 2022-03-04 NOTE — Progress Notes (Signed)
removed 4059ms net fluid.no complaints no complications tolerated tx well.  pre bp 117/63 post bp 116/63 pre weight 115.3kg post weight 111.0kg bed scales.  catheter positional had to run lines reversed due to multiple alarms, (temporary catheter) .  packed with heparin clamped and capped. ?

## 2022-03-04 NOTE — Progress Notes (Addendum)
? ? ? ?NAME:  Jeffrey Ayala, MRN:  [id number], DOB:  04/26/1961, LOS: 10 ?ADMISSION DATE:  02/22/2022, CONSULTATION DATE:  4/2 ?REFERRING MD:  Doren Custard- ED CHIEF COMPLAINT: Septic shock ? ?History of Present Illness:  ?Jeffrey Ayala, is a 61 y.o. male, who presented to the [facility] ED with a chief complaint of nausea, vomiting, diarrhea. ? ?They have a pertinent past medical history of gastric ulcer, kidney stones, eosinophilic esophagitis, migraines  ? ?History obtained from Jeffrey Ayala (spouse), on 4/1 patient began complaining of feeling unwell. Wife denies sick contacts, questionable food.  Wife denies taking any meds currently.  Around 2 PM on 4/2 the patient developed nausea, vomiting, and diarrhea while at a birthday party and soiled himself. EMS was called. When EMS arrived he was found to be in A-fib with a rate of 120-160. ? ?At the ED he was found to be febrile with a Tmax of 102.9.  A code sepsis was called in the emergency department, blood cultures were drawn, 2 L of IV fluid were given, vanc and cefepime were ordered.  CT chest negative for acute process.  CT abdomen pelvis with and without contrast notable for swollen scrotum concerning for infection, colon filled with diarrhea.  Urology and cardiology were consulted by the ED.  Despite fluid resuscitation the patient developed hypotension requiring vasopressors. ? ?PCCM was consulted for admission. ? ?Pertinent  Medical History  ?gastric ulcer, kidney stones, eosinophilic esophagitis, migraines  ? ?Significant Hospital Events: ?Including procedures, antibiotic start and stop dates in addition to other pertinent events   ?4/2 Presented to the ED. CT chest negative for acute process.  CT abdomen pelvis with and without contrast notable for swollen scrotum concerning for infection, colon filled with diarrhea.  Urology and cardiology were consulted by the ED. Vanc, cefepime started.  ?4/3 Intubated overnight, brief cardiac arrest. Escalating pressors. ?4/5  started on methylene blue ?4/6 started on CRRT ?4/8 appears more responsive ?4/9 responsive ?4/10 Extubated, PT rec IPR ?4/11 Vascular consulted for foot wounds, bival stopped ? ? ?Interim History / Subjective:  ?24-hour events: ?Tmax 100.6 ?+10 L admission, -150 past 24 hours, 30 urine output ? ?Subjective: No complaints.  Denies chest pain and shortness of breath. ?Objective   ?Blood pressure 109/61, pulse (!) 111, temperature 99.5 ?F (37.5 ?C), temperature source Bladder, resp. rate 16, height '5\' 10"'$  (1.778 m), weight 114.7 kg, SpO2 97 %.  ?   ?   ? ?Intake/Output Summary (Last 24 hours) at 03/04/2022 0842 ?Last data filed at 03/04/2022 0600 ?Gross per 24 hour  ?Intake 1154.54 ml  ?Output 1281 ml  ?Net -126.46 ml  ? ?Filed Weights  ? 03/02/22 0500 03/03/22 0500 03/04/22 0500  ?Weight: 128.7 kg 117.6 kg 114.7 kg  ? ? ?Examination: ?General: In bed, NAD, appears comfortable ?HEENT: MM pink/moist, anicteric, atraumatic ?Neuro: RASS 0, PERRL 77m, GCS 15 ?CV: S1S2, Afib, no m/r/g appreciated ?PULM:  clear in the upper lobes, clear in the lower lobes, trachea midline, chest expansion symmetric ?GI: soft, bsx4 active, non-tender   ?Extremities: warm/dry, no pretibial edema, capillary refill less than 3 seconds  ?Skin: swollen scrotum, bilateral lower extremities with wounds and discoloration ? ?Pertinent labs ?Blood glucose 104-158 ?WBC 13.3> 12.6 ?Hemoglobin 8.3> 7.6 ?Platelets 115> 133 ?Magnesium 3, Phos 6.6 ?K3.5, creatinine 3.73, BUN 59, albumin 2.1 ? ?Resolved issues ?Acute hypoxemic respiratory failure require mechanical ventilation, likely due to aspiration pneumonia- improved ?Acute septic/metabolic encephalopathy- resolved ? ?Assessment & Plan:  ?Septic shock, improved ?  Scrotal cellulitis ?Right lower lobe aspiration pneumonia ?Patient remains off vasopressors. Cultures remain negative.   ?-Continue IV Zosyn with total 10 days. ?-Goal MAP 60-65 ?-Follow fever WBC curve ?-Urology following, no signs of infection  per urology scrotum ?-Continue oxy 10 mg every 4 hours and, and hydromorphone 0.25 mg prnevery 4 hours for breakthrough pain ? ?Persistent atrial fibrillation with RVR ?Likely secondary to sepsis.  Rate remains controlled.  CHA2DS2-VASc 1. angiomax stopped on 4/11 after discussion with vascular ?-Continue as needed metoprolol ?-CHA2DS2-VASc 1, discussed with Dr. Tacy Learn. Not initiating anticoagulation at this time ? ?Acute kidney injury due to ischemic ATN from septic shock ?30 mL of urine documented overnight. ?-Possible IHD today ?-Ensure renal perfusion. Goal MAP 65 or greater. ?-Avoid neprotoxic drugs as possible. ?-Strict I&O's ?-Follow up AM creatinine ? ?Shock liver ?Bilirubinemia  ?Suspect secondary to shock liver from sepsis ?-Continue supportive care ? ?Bilateral foot wounds ?Suspect secondary to septic shock, high dose vasopressors ?-VVS following, appreciate assistance ? ?Gastric ulcer ?-On PPI, will stop once tolerating diet ? ? ?Best Practice (right click and "Reselect all SmartList Selections" daily)  ? ?Diet/type: Regular consistency (see orders) ?DVT prophylaxis: heparin ?GI prophylaxis: N/A ?Lines: Dialysis Catheter and yes and it is still needed ?Foley:  Yes, and it is no longer needed ?Code Status:  full code ?Last date of multidisciplinary goals of care discussion [4/10 please see plan of care note ? ? ?Total critical care time: N/A ? ?Redmond School., MSN, APRN, AGACNP-BC ?[facility]  ?03/04/2022 , 8:42 AM ? ?Please see [email or web address] for pager details ? ?If no response, please call [phone] ?After hours, please call Elink at [phone] ? ? ? ?

## 2022-03-04 NOTE — Progress Notes (Signed)
?Subjective: ?Patient is sitting up in bed and alert.  Reports less scrotal pain today. WBC down slightly today.   ? ?Objective: ?Vital signs in last 24 hours: ?Temp:  [99.2 ?F (37.3 ?C)-100.6 ?F (38.1 ?C)] 100 ?F (37.8 ?C) (04/12 1800) ?Pulse Rate:  [95-128] 119 (04/12 1830) ?Resp:  [12-21] 16 (04/12 1830) ?BP: (92-144)/(48-71) 117/59 (04/12 1830) ?SpO2:  [95 %-100 %] 100 % (04/12 1830) ?Weight:  [114.7 kg-115.3 kg] 115.3 kg (04/12 1453) ? ?Intake/Output from previous day: ?04/11 0701 - 04/12 0700 ?In: 1300.2 [P.O.:875; I.V.:291.6; IV Piggyback:133.6] ?Out: 5993 [Urine:30; Stool:100] ? ?Intake/Output this shift: ?Total I/O ?In: 570 [P.O.:831] ?Out: 15 [Urine:15] ? ?Physical Exam:  ?General: Alert and oriented ?GU: slightly improved, but still diffuse peno-scrotal edema without signs of spreading erythema, cellulitis, crepitus or necrosis.  Anterior portion of the scrotal skin has sloughed with underlying green discoloration.  Peripheral skin margins around the scrotum are intact and exhibit no signs of spreading erythema or discoloration   ? ?Lab Results: ?Recent Labs  ?  03/02/22 ?0305 03/03/22 ?0326 03/04/22 ?0316  ?HGB 8.6* 8.3* 7.6*  ?HCT 26.3* 25.5* 23.7*  ? ?BMET ?Recent Labs  ?  03/03/22 ?1639 03/04/22 ?0316  ?NA 134* 135  ?K 3.4* 3.5  ?CL 99 101  ?CO2 24 23  ?GLUCOSE 113* 116*  ?BUN 47* 59*  ?CREATININE 2.71* 3.73*  ?CALCIUM 8.1* 8.1*  ? ? ? ?Studies/Results: ?VAS [place] ABI WITH/WO TBI ? ?Result Date: 03/04/2022 ? LOWER EXTREMITY DOPPLER STUDY Patient Name:  Jeffrey Ayala  Date of Exam:   03/04/2022 Medical Rec #: [id number]         Accession #:    [id number] Date of Birth: [birth year]         Patient Gender: M Patient Age:   85 years Exam Location:  [facility] Procedure:      VAS [place] ABI WITH/WO TBI Referring Phys: Harrell Gave DICKSON --------------------------------------------------------------------------------  Indications: Follow up study. Off pressors now.  Comparison Study: prior 02/24/22  Performing Technologist: Archie Patten RVS  Examination Guidelines: A complete evaluation includes at minimum, Doppler waveform signals and systolic blood pressure reading at the level of bilateral brachial, anterior tibial, and posterior tibial arteries, when vessel segments are accessible. Bilateral testing is considered an integral part of a complete examination. Photoelectric Plethysmograph (PPG) waveforms and toe systolic pressure readings are included as required and additional duplex testing as needed. Limited examinations for reoccurring indications may be performed as noted.  ABI Findings: +---------+------------------+-----+---------+---------------------------------+ Right    Rt Pressure (mmHg)IndexWaveform Comment                           +---------+------------------+-----+---------+---------------------------------+ Brachial 116                    triphasic                                  +---------+------------------+-----+---------+---------------------------------+ PTA      178               1.53 biphasic                                   +---------+------------------+-----+---------+---------------------------------+ DP       144               1.24  biphasic                                   +---------+------------------+-----+---------+---------------------------------+ Great Toe                                unable to obtain pressure due to                                           low amplitude waveform            +---------+------------------+-----+---------+---------------------------------+ +---------+------------------+-----+---------+-------+ Left     Lt Pressure (mmHg)IndexWaveform Comment +---------+------------------+-----+---------+-------+ Brachial 115                    triphasic        +---------+------------------+-----+---------+-------+ PTA      160               1.38 triphasic         +---------+------------------+-----+---------+-------+ DP       144               1.24 biphasic         +---------+------------------+-----+---------+-------+ Great Toe78                0.67 Abnormal         +---------+------------------+-----+---------+-------+ +-------+-----------+-----------+------------+------------+ ABI/TBIToday's ABIToday's TBIPrevious ABIPrevious TBI +-------+-----------+-----------+------------+------------+ Right  1.53                                           +-------+-----------+-----------+------------+------------+ Left   1.38       0.67                                +-------+-----------+-----------+------------+------------+  Summary: Right: Resting right ankle-brachial index indicates noncompressible right lower extremity arteries. Left: Resting left ankle-brachial index indicates noncompressible left lower extremity arteries. The left toe-brachial index is abnormal. *See table(s) above for measurements and observations.  Electronically signed by Monica Martinez MD on 03/04/2022 at 4:33:40 PM.    Final    ? ?Assessment/Plan: ?61 year old male with severe sepsis and diffuse scrotal edema ? ?-Exam slightly improved today with less edema.  No signs of worsening scrotal infection despite green skin discoloration (likely exudate from prior methylene blue administration).  Currently on zosyn.  Will continue to monitor.  ? ? LOS: 10 days  ? ?Ellison Hughs, MD ?[facility] ?Pager: [phone] ? ?03/04/2022, 7:00 PM  ? ?

## 2022-03-04 NOTE — Plan of Care (Signed)
?  Interdisciplinary Goals of Care Family Meeting ? ? ?Date carried out:: 03/04/2022 ? ?Location of the meeting: Bedside ? ?Member's involved: Nurse Practitioner, Bedside Registered Nurse, Family Member or next of kin, and Other: patient ? ?Jeffrey Ayala, patient's spouse present ?Felecia RN present ? ?Durable Power of Tour manager: Patient and spouse ? ?Discussion: We discussed goals of care for Jeffrey Ayala .  Discussed events of past 24 hours and plan.  Answered all questions. ? ?Discussed with patient and wife that if in the event of the patient's heart stopping what he still want CPR or if he is respiratory status or deteriorate would he want mechanical intubation.  They like to continue full scope of care at this time including intubation and CPR. ? ?Code status: Full Code ? ?Disposition: Continue current acute care ? ? ?Time spent for the meeting: 11 minutes ? ?Estill Cotta ?03/04/2022, 10:53 AM ? ?

## 2022-03-05 ENCOUNTER — Inpatient Hospital Stay (HOSPITAL_COMMUNITY): Payer: BC Managed Care – PPO

## 2022-03-05 DIAGNOSIS — I96 Gangrene, not elsewhere classified: Secondary | ICD-10-CM

## 2022-03-05 DIAGNOSIS — R6521 Severe sepsis with septic shock: Secondary | ICD-10-CM | POA: Diagnosis not present

## 2022-03-05 DIAGNOSIS — A419 Sepsis, unspecified organism: Secondary | ICD-10-CM | POA: Diagnosis not present

## 2022-03-05 LAB — CBC
HCT: 24.3 % — ABNORMAL LOW (ref 39.0–52.0)
Hemoglobin: 7.5 g/dL — ABNORMAL LOW (ref 13.0–17.0)
MCH: 29 pg (ref 26.0–34.0)
MCHC: 30.9 g/dL (ref 30.0–36.0)
MCV: 93.8 fL (ref 80.0–100.0)
Platelets: 158 10*3/uL (ref 150–400)
RBC: 2.59 MIL/uL — ABNORMAL LOW (ref 4.22–5.81)
RDW: 16.9 % — ABNORMAL HIGH (ref 11.5–15.5)
WBC: 13.5 10*3/uL — ABNORMAL HIGH (ref 4.0–10.5)
nRBC: 0 % (ref 0.0–0.2)

## 2022-03-05 LAB — GLUCOSE, CAPILLARY
Glucose-Capillary: 122 mg/dL — ABNORMAL HIGH (ref 70–99)
Glucose-Capillary: 123 mg/dL — ABNORMAL HIGH (ref 70–99)
Glucose-Capillary: 118 mg/dL — ABNORMAL HIGH (ref 70–99)
Glucose-Capillary: 118 mg/dL — ABNORMAL HIGH (ref 70–99)

## 2022-03-05 LAB — RENAL FUNCTION PANEL
Albumin: 2.2 g/dL — ABNORMAL LOW (ref 3.5–5.0)
Anion gap: 12 (ref 5–15)
BUN: 59 mg/dL — ABNORMAL HIGH (ref 6–20)
CO2: 22 mmol/L (ref 22–32)
Calcium: 8.3 mg/dL — ABNORMAL LOW (ref 8.9–10.3)
Chloride: 101 mmol/L (ref 98–111)
Creatinine, Ser: 4.65 mg/dL — ABNORMAL HIGH (ref 0.61–1.24)
GFR, Estimated: 14 mL/min — ABNORMAL LOW (ref >60)
Glucose, Bld: 127 mg/dL — ABNORMAL HIGH (ref 70–99)
Phosphorus: 7.7 mg/dL — ABNORMAL HIGH (ref 2.5–4.6)
Potassium: 3.7 mmol/L (ref 3.5–5.1)
Sodium: 135 mmol/L (ref 135–145)

## 2022-03-05 LAB — HEPATITIS B SURFACE ANTIBODY, QUANTITATIVE: Hep B S AB Quant (Post): <3.1 m[IU]/mL — ABNORMAL LOW (ref >9.9)

## 2022-03-05 LAB — MAGNESIUM: Magnesium: 2.9 mg/dL — ABNORMAL HIGH (ref 1.7–2.4)

## 2022-03-05 MED ORDER — CALCIUM GLUCONATE-NACL 2-0.675 GM/100ML-% IV SOLN: 2.0000 g | Freq: Once | INTRAVENOUS | Status: DC

## 2022-03-05 MED ORDER — HEPARIN SODIUM (PORCINE) 1000 UNIT/ML IJ SOLN
INTRAMUSCULAR | Status: AC
  Filled 2022-03-05: qty 4

## 2022-03-05 MED ORDER — METOPROLOL TARTRATE 12.5 MG HALF TABLET
12.5000 mg | ORAL_TABLET | Freq: Two times a day (BID) | ORAL | Status: DC
  Administered 2022-03-05 – 2022-03-06 (×2): 12.5 mg via ORAL
  Filled 2022-03-05 (×2): qty 1

## 2022-03-05 NOTE — Progress Notes (Signed)
   Inpatient Rehab Admissions Coordinator :  Per therapy recommendations patient was screened for CIR candidacy by Janesa Dockery RN MSN. Patient is not yet at a level to tolerate the intensity required to pursue a CIR admit . Patient may have the potential to progress to become a candidate. The CIR admissions team will follow and monitor for progress and place a Rehab Consult order if felt to be appropriate. Please contact me with any questions.  Joylene Wescott RN MSN Admissions Coordinator [phone]  

## 2022-03-05 NOTE — Progress Notes (Signed)
? ? ? ?NAME:  Jeffrey Ayala, MRN:  [id number], DOB:  [birth year], LOS: 55 ?ADMISSION DATE:  02/22/2022, CONSULTATION DATE:  4/2 ?REFERRING MD:  Doren Custard- ED CHIEF COMPLAINT: Septic shock ? ?History of Present Illness:  ?Jeffrey Ayala, is a 61 y.o. male, who presented to the [facility] ED with a chief complaint of nausea, vomiting, diarrhea. ? ?They have a pertinent past medical history of gastric ulcer, kidney stones, eosinophilic esophagitis, migraines  ? ?History obtained from Savannah Erbe (spouse), on 4/1 patient began complaining of feeling unwell. Wife denies sick contacts, questionable food.  Wife denies taking any meds currently.  Around 2 PM on 4/2 the patient developed nausea, vomiting, and diarrhea while at a birthday party and soiled himself. EMS was called. When EMS arrived he was found to be in A-fib with a rate of 120-160. ? ?At the ED he was found to be febrile with a Tmax of 102.9.  A code sepsis was called in the emergency department, blood cultures were drawn, 2 L of IV fluid were given, vanc and cefepime were ordered.  CT chest negative for acute process.  CT abdomen pelvis with and without contrast notable for swollen scrotum concerning for infection, colon filled with diarrhea.  Urology and cardiology were consulted by the ED.  Despite fluid resuscitation the patient developed hypotension requiring vasopressors. ? ?PCCM was consulted for admission. ? ?Pertinent  Medical History  ?gastric ulcer, kidney stones, eosinophilic esophagitis, migraines  ? ?Significant Hospital Events: ?Including procedures, antibiotic start and stop dates in addition to other pertinent events   ?4/2 Presented to the ED. CT chest negative for acute process.  CT abdomen pelvis with and without contrast notable for swollen scrotum concerning for infection, colon filled with diarrhea.  Urology and cardiology were consulted by the ED. Vanc, cefepime started.  ?4/3 Intubated overnight, brief cardiac arrest. Escalating pressors. ?4/5  started on methylene blue ?4/6 started on CRRT ?4/8 appears more responsive ?4/9 responsive ?4/10 Extubated, PT rec IPR ?4/11 Vascular consulted for foot wounds, bival stopped ?4/12 1st IHD ? ? ?Interim History / Subjective:  ?4 L pulled off and IHD session overnight ? ?No acute events ? ?Tmax 100.4 ? ?Subjective: No complaints of pain on exam ? ?Objective   ?Blood pressure 108/62, pulse 97, temperature 97.7 ?F (36.5 ?C), temperature source Oral, resp. rate 19, height '5\' 10"'$  (1.778 m), weight 110.9 kg, SpO2 99 %.  ?   ?   ? ?Intake/Output Summary (Last 24 hours) at 03/05/2022 0855 ?Last data filed at 03/05/2022 0600 ?Gross per 24 hour  ?Intake 710.42 ml  ?Output 4025 ml  ?Net -3314.58 ml  ? ?Filed Weights  ? 03/04/22 1453 03/04/22 1841 03/05/22 0500  ?Weight: 115.3 kg 111.3 kg 110.9 kg  ? ? ?Examination: ?General: In bed, NAD, appears comfortable ?HEENT: MM pink/moist, anicteric, atraumatic ?Neuro: RASS 0, PERRL 35m, MAE, oriented ?CV: S1S2, Afib, no m/r/g appreciated ?PULM:  clear in the upper lobes, clear in the lower lobes, trachea midline, chest expansion symmetric ?GI: rounded, bsx4 active, non-tender   ?Extremities: Upper extremities, warm, cap refill less than 3 seconds, lower extremities, toes purple/black, L foot farm, R foot toes cold, pedal area warm, delayed cap refil ?Skin: scrotal edema, bilateral foot wounds ? ?Pertinent labs: ?WBC 13.3> 12.6> 13.5 ?Hemoglobin 7.6>7.5 ?Magnesium 2.9, creatinine 4.65, BUN 59, phosphorus 7.7 ? ? ?Assessment & Plan:  ?Septic shock, improved ?Scrotal cellulitis ?Right lower lobe aspiration pneumonia ?Remains off IV vasopressors.  Intermittent fevers.  Tmax 100.4.  WBC 13.3> 12.6> 13.5 ?-Continue IV Zosyn for 10 days total, currently day 9 of 10 ?-MAP 60- 65 ?-Follow fever WBC curve ?-Urology following scrotum.  Appreciate assistance ?-Continue oxy 10 mg every 4 hours for pain ? ?Persistent atrial fibrillation with RVR ?Likely secondary to sepsis.  Rate remains controlled.   CHA2DS2-VASc 1. angiomax stopped on 4/11 after discussion with vascular ?-Start metoprolol 12.5 mg twice daily, as needed IV metoprolol. ?-CHA2DS2-VASc 1, discussed with Dr. Tacy Learn. Not initiating anticoagulation at this time ? ?Acute kidney injury due to ischemic ATN from septic shock, on IHD ?25 mL of urine documented.  ?-Nephrology following appreciate assistance. Will likely need transition from Temp HD cath to longer term access. ?-Ensure renal perfusion. Goal MAP 65 or greater. ?-Avoid neprotoxic drugs as possible. ?-Strict I&O's ?-Follow up AM creatinine ?-Bladder scan per unit protocol.  Daily I&O cath ? ?Shock liver ?Bilirubinemia  ?Suspect secondary to shock liver from sepsis ?-Supportive care ? ?Bilateral foot wounds ?Suspect secondary to septic shock, high dose vasopressors ?-Vascular surgery following, appreciate assistance, waiting for wounds to demarcate ? ?Gastric ulcer ?-Continue PPI, will stop once tolerating diet ? ?Stable for transfer to progressive care. Will sign out to [facility]. ? ?Best Practice (right click and "Reselect all SmartList Selections" daily)  ? ?Diet/type: Regular consistency (see orders) ?DVT prophylaxis: heparin ?GI prophylaxis: N/A ?Lines: Dialysis Catheter and yes and it is still needed ?Foley:  Yes, and it is no longer needed ?Code Status:  full code ?Last date of multidisciplinary goals of care discussion [4/12 please see plan of care note ? ? ?Total critical care time: N/A ? ?Redmond School., MSN, APRN, AGACNP-BC ?[facility]  ?03/05/2022 , 8:55 AM ? ?Please see [email or web address] for pager details ? ?If no response, please call [phone] ?After hours, please call Elink at [phone] ? ? ? ?

## 2022-03-05 NOTE — Progress Notes (Signed)
?  Progress Note ? ? ? ?03/05/2022 ?8:08 AM ? ?Subjective:  alert but not oriented ? ? ?Vitals:  ? 03/05/22 0600 03/05/22 0720  ?BP: 108/62   ?Pulse: 97   ?Resp: 19   ?Temp:  97.7 ?F (36.5 ?C)  ?SpO2: 99%   ? ?Physical Exam: ?Lungs:  non labored ?Extremities:  gangrenous tissue changes R more so than L foot ?Neurologic: alert but not oriented ? ?CBC ?   ?Component Value Date/Time  ? WBC 13.5 (H) 03/05/2022 0453  ? RBC 2.59 (L) 03/05/2022 0453  ? HGB 7.5 (L) 03/05/2022 0453  ? HCT 24.3 (L) 03/05/2022 0453  ? PLT 158 03/05/2022 0453  ? MCV 93.8 03/05/2022 0453  ? MCV 92.8 04/10/2014 0900  ? MCH 29.0 03/05/2022 0453  ? MCHC 30.9 03/05/2022 0453  ? RDW 16.9 (H) 03/05/2022 0453  ? LYMPHSABS 0.1 (L) 02/22/2022 1805  ? MONOABS 0.0 (L) 02/22/2022 1805  ? EOSABS 0.0 02/22/2022 1805  ? BASOSABS 0.0 02/22/2022 1805  ? ? ?BMET ?   ?Component Value Date/Time  ? NA 135 03/05/2022 0453  ? K 3.7 03/05/2022 0453  ? CL 101 03/05/2022 0453  ? CO2 22 03/05/2022 0453  ? GLUCOSE 127 (H) 03/05/2022 0453  ? BUN 59 (H) 03/05/2022 0453  ? CREATININE 4.65 (H) 03/05/2022 0453  ? CREATININE 0.99 04/10/2014 0852  ? CALCIUM 8.3 (L) 03/05/2022 0453  ? GFRNONAA 14 (L) 03/05/2022 0453  ? GFRAA >60 09/14/2019 1808  ? ? ?INR ?   ?Component Value Date/Time  ? INR 1.3 (H) 02/26/2022 0851  ? ? ? ?Intake/Output Summary (Last 24 hours) at 03/05/2022 0808 ?Last data filed at 03/05/2022 0600 ?Gross per 24 hour  ?Intake 710.42 ml  ?Output 4025 ml  ?Net -3314.58 ml  ? ? ? ?Assessment/Plan:  61 y.o. male with BLE ischemic tissue changes after cardiogenic shock with pressor support ? ?ABIs demonstrate multiphasic flow to the level of the ankle ?Plan is to allow feet to demarcate; currently there is only dry gangrene.  NO indication for amputation currently ? ? ?Dagoberto Ligas, PA-C ?[facility] ?[phone] ?03/05/2022 ?8:08 AM ? ? ? ?

## 2022-03-05 NOTE — Progress Notes (Signed)
?  Subjective: ?No complaints this morning  ? ?Objective: ?Vital signs in last 24 hours: ?Temp:  [97.7 ?F (36.5 ?C)-100.4 ?F (38 ?C)] 98.1 ?F (36.7 ?C) (04/13 1207) ?Pulse Rate:  [95-122] 97 (04/13 0600) ?Resp:  [13-22] 19 (04/13 0600) ?BP: (99-144)/(48-83) 108/62 (04/13 0600) ?SpO2:  [96 %-100 %] 99 % (04/13 0600) ?Weight:  [110.9 kg-115.3 kg] 110.9 kg (04/13 0500) ? ?Intake/Output from previous day: ?04/12 0701 - 04/13 0700 ?In: 947.4 [P.O.:831; IV Piggyback:116.4] ?Out: 4025 [Urine:25] ? ?Intake/Output this shift: ?No intake/output data recorded. ? ?Physical Exam:  ?General: Alert and oriented ?GU:  Stable penoscrotal edema.  The discoloration of the anterior portion of the scrotum has evolved from bluish-green to green.  No signs of necrosis, flutuence, spreading erythema or crepitus involving the scrotum.  No purulence or exudate with extensive palpation of the scrotal skin.  Skin borders along the lateral side of the scrotum and inguinal creases show no signs of spreading infection  ? ?Lab Results: ?Recent Labs  ?  03/03/22 ?0326 03/04/22 ?0316 03/05/22 ?0722  ?HGB 8.3* 7.6* 7.5*  ?HCT 25.5* 23.7* 24.3*  ? ?BMET ?Recent Labs  ?  03/04/22 ?0316 03/05/22 ?0453  ?NA 135 135  ?K 3.5 3.7  ?CL 101 101  ?CO2 23 22  ?GLUCOSE 116* 127*  ?BUN 59* 59*  ?CREATININE 3.73* 4.65*  ?CALCIUM 8.1* 8.3*  ? ? ? ?Studies/Results: ?CLINICAL DATA:  Scrotal swelling or edema. ?  ?EXAM: ?CT PELVIS WITHOUT CONTRAST ?  ?TECHNIQUE: ?Multidetector CT imaging of the pelvis was performed following the ?standard protocol without intravenous contrast. ?  ?RADIATION DOSE REDUCTION: This exam was performed according to the ?departmental dose-optimization program which includes automated ?exposure control, adjustment of the mA and/or kV according to ?patient size and/or use of iterative reconstruction technique. ?  ?COMPARISON:  CT examination dated February 28, 2022 ?  ?FINDINGS: ?Urinary Tract:  Urinary bladder is unremarkable. ?  ?Bowel:  Bowel  loops are normal in caliber.  Rectal tube in place. ?  ?Vascular/Lymphatic: No pathologically enlarged lymph nodes. ?Atherosclerotic calcification of aorta and branch vessels which are ?normal in caliber. ?  ?Reproductive:  Prostate is normal in size. ?  ?Other: There is generalized anasarca with subcutaneous edema. There ?is again marked scrotal thickening with bilateral hydrocele. No ?evidence of scrotal abscess. ?  ?Musculoskeletal: Degenerate disc disease of the lower lumbar spine. ?Hip joints, sacroiliac joints and pubic symphysis are maintained. No ?acute fracture or dislocation. No suspicious osseous lesion. ?  ?IMPRESSION: ?1.  Mild generalized anasarca. ?  ?2. Persistent marked scrotal skin thickening and moderate bilateral ?hydrocele. No CT evidence of scrotal abscess. These findings are ?unchanged from prior CT examination of February 28, 2022. Further ?evaluation with sonogram would be helpful if clinically warranted. ?  ?  ?Electronically Signed ?  By: Keane Police D.O. ?  On: 03/05/2022 11:50 ? ?Assessment/Plan: ?61 year old male with severe sepsis and diffuse scrotal edema ? ?-CT shows no signs of necrotizing fascitis or abscess within the scrotum.  Scrotum likely discolored from previous methylene blue. Continue zosyn.  Will continue to monitor with daily exams  ? ? LOS: 11 days  ? ?Ellison Hughs, MD ?[facility] ?Pager: [phone] ? ?03/05/2022, 12:43 PM  ? ?

## 2022-03-05 NOTE — Progress Notes (Signed)
Physical Therapy Treatment ?Patient Details ?Name: Jeffrey Ayala ?MRN: [id number] ?DOB: 12/29/1960 ?Today's Date: 03/05/2022 ? ? ?History of Present Illness Pt adm 4/2 c/o N/V/D and found to have septic shock due to scrotal cellulitis vs RLL PNA.  Pt intubated 4/3 and brief cardiac arrest post intubation. [name] started 4/5.  Pt with AKI and started on CRRT on 4/6. Pt extubated 4/10. Pt also with afib with rvr. PMH - Bil TKR, migraines ? ?  ?PT Comments  ? ? Pt making slow, steady progress. Still lethargic at times but able to engage when stimulated. Continue to recommend acute inpatient rehab.    ?Recommendations for follow up therapy are one component of a multi-disciplinary discharge planning process, led by the attending physician.  Recommendations may be updated based on patient status, additional functional criteria and insurance authorization. ? ?Follow Up Recommendations ? Acute inpatient rehab (3hours/day) ?  ?  ?Assistance Recommended at Discharge Frequent or constant Supervision/Assistance  ?Patient can return home with the following Two people to help with walking and/or transfers;Two people to help with bathing/dressing/bathroom;Help with stairs or ramp for entrance;Assist for transportation ?  ?Equipment Recommendations ? Wheelchair (measurements PT);Wheelchair cushion (measurements PT);Hospital bed;Other (comment) (hoyer lift)  ?  ?Recommendations for Other Services   ? ? ?  ?Precautions / Restrictions Precautions ?Precautions: Fall ?Precaution Comments: swollen scrotum, feet with gangrene and blisters  ?  ? ?Mobility ? Bed Mobility ?Overal bed mobility: Needs Assistance ?Bed Mobility: Supine to Sit, Sit to Supine, Rolling ?Rolling: Max assist, +2 for physical assistance, +2 for safety/equipment ?  ?Supine to sit: Max assist, +2 for physical assistance, +2 for safety/equipment ?Sit to supine: Total assist, +2 for physical assistance, +2 for safety/equipment ?  ?General bed mobility comments:  using bed pad transitioned to EOB with max assist +2 given increased time for initation, cueing for sequencing and technique. ?  ? ?Transfers ?  ?  ?  ?  ?  ?  ?  ?  ?  ?  ?  ? ?Ambulation/Gait ?  ?  ?  ?  ?  ?  ?  ?  ? ? ?Stairs ?  ?  ?  ?  ?  ? ? ?Wheelchair Mobility ?  ? ?Modified Rankin (Stroke Patients Only) ?  ? ? ?  ?Balance Overall balance assessment: Needs assistance ?Sitting-balance support: Single extremity supported, Bilateral upper extremity supported, Feet supported ?Sitting balance-Leahy Scale: Poor ?Sitting balance - Comments: Pt sat EOB x 20 minutes with assist varying from min guard to mod assist. Pt trying to correct balance using UE's but no always successful. Manual facilitation for trunk and neck extension. ?Postural control: Posterior lean, Right lateral lean ?  ?  ?  ?  ?  ?  ?  ?  ?  ?  ?  ?  ?  ?  ?  ? ?  ?Cognition Arousal/Alertness: Lethargic ?Behavior During Therapy: Flat affect ?Overall Cognitive Status: Impaired/Different from baseline ?Area of Impairment: Attention, Memory, Following commands, Awareness, Problem solving ?  ?  ?  ?  ?  ?  ?  ?  ?  ?Current Attention Level: Focused ?Memory: Decreased short-term memory ?Following Commands: Follows one step commands consistently, Follows one step commands with increased time ?  ?Awareness: Emergent ?Problem Solving: Slow processing, Decreased initiation, Difficulty sequencing, Requires verbal cues, Requires tactile cues ?  ?  ?  ? ?  ?Exercises   ? ?  ?General Comments General comments (skin integrity, edema, etc.):  Discoloration and blisters noted on B feet ?  ?  ? ?Pertinent Vitals/Pain Pain Assessment ?Pain Assessment: Faces ?Faces Pain Scale: No hurt  ? ? ?Home Living   ?  ?  ?  ?  ?  ?  ?  ?  ?  ?   ?  ?Prior Function    ?  ?  ?   ? ?PT Goals (current goals can now be found in the care plan section) Progress towards PT goals: Progressing toward goals ? ?  ?Frequency ? ? ? Min 3X/week ? ? ? ?  ?PT Plan Current plan remains appropriate   ? ? ?Co-evaluation PT/OT/SLP Co-Evaluation/Treatment: Yes ?Reason for Co-Treatment: For patient/therapist safety ?PT goals addressed during session: Mobility/safety with mobility;Balance ?OT goals addressed during session: ADL's and self-care ?  ? ?  ?AM-PAC PT "6 Clicks" Mobility   ?Outcome Measure ? Help needed turning from your back to your side while in a flat bed without using bedrails?: Total ?Help needed moving from lying on your back to sitting on the side of a flat bed without using bedrails?: Total ?Help needed moving to and from a bed to a chair (including a wheelchair)?: Total ?Help needed standing up from a chair using your arms (e.g., wheelchair or bedside chair)?: Total ?Help needed to walk in hospital room?: Total ?Help needed climbing 3-5 steps with a railing? : Total ?6 Click Score: 6 ? ?  ?End of Session   ?Activity Tolerance: Patient tolerated treatment well ?Patient left: in bed;with call bell/phone within reach;with family/visitor present ?Nurse Communication: Mobility status;Need for lift equipment ?PT Visit Diagnosis: Other abnormalities of gait and mobility (R26.89);Muscle weakness (generalized) (M62.81) ?  ? ? ?Time: 1008-1050 ?PT Time Calculation (min) (ACUTE ONLY): 42 min ? ?Charges:  $Therapeutic Activity: 8-22 mins          ?          ? ?[facility] PT ?Acute Rehabilitation Services ?Office [phone] ? ? ? ?Shary Decamp [facility] ?03/05/2022, 2:20 PM ? ?

## 2022-03-05 NOTE — Progress Notes (Addendum)
Occupational Therapy Treatment ?Patient Details ?Name: Jeffrey Ayala ?MRN: [id number] ?DOB: Jan 02, 1961 ?Today's Date: 03/05/2022 ? ? ?History of present illness Pt adm 4/2 c/o N/V/D and found to have septic shock due to scrotal cellulitis vs RLL PNA.  Pt intubated 4/3 and brief cardiac arrest post intubation. [name] started 4/5.  Pt with AKI and started on CRRT on 4/6. Pt extubated 4/10. Pt also with afib with rvr. PMH - Bil TKR, migraines ?  ?OT comments ? Pt supine in bed, spouse at side.  Pt more alert today, but fatigues easily.  He requires max assist +2 for bed mobility, maintained sitting EOB for close to 20 minutes with min guard to mod assist.  Dynamically and statically with fatigue looses balance to R and posterior.  Continues to requires max assist for grooming tasks, able to utilize L hand more than dominant R UE.  He requires increased time to follow commands, slow processing and decreased initiation.  Requested soft touch call bell for pt. Will follow acutely.   ? ?Recommendations for follow up therapy are one component of a multi-disciplinary discharge planning process, led by the attending physician.  Recommendations may be updated based on patient status, additional functional criteria and insurance authorization. ?   ?Follow Up Recommendations ? Acute inpatient rehab (3hours/day)  ?  ?Assistance Recommended at Discharge Frequent or constant Supervision/Assistance  ?Patient can return home with the following ? Two people to help with walking and/or transfers;Two people to help with bathing/dressing/bathroom;Assistance with cooking/housework;Assistance with feeding;Direct supervision/assist for medications management;Direct supervision/assist for financial management;Assist for transportation;Help with stairs or ramp for entrance ?  ?Equipment Recommendations ? Other (comment) (defer)  ?  ?Recommendations for Other Services Rehab consult ? ?  ?Precautions / Restrictions  Precautions ?Precautions: Fall ?Precaution Comments: swollen scrotum  ? ? ?  ? ?Mobility Bed Mobility ?Overal bed mobility: Needs Assistance ?Bed Mobility: Supine to Sit, Sit to Supine, Rolling ?Rolling: Max assist, +2 for physical assistance, +2 for safety/equipment ?  ?Supine to sit: Max assist, +2 for physical assistance, +2 for safety/equipment ?Sit to supine: Total assist, +2 for physical assistance, +2 for safety/equipment ?  ?General bed mobility comments: using bed pad transitioned to EOB with max assist +2 given increased time for initation, cueing for sequencing and technique. ?  ? ?Transfers ?  ?  ?  ?  ?  ?  ?  ?  ?  ?  ?  ?  ?Balance Overall balance assessment: Needs assistance ?Sitting-balance support: Single extremity supported, Bilateral upper extremity supported, Feet supported ?Sitting balance-Leahy Scale: Poor ?Sitting balance - Comments: EOB for close to 20 minutes with 0-2 hand support, preference for L UE support with at best min guard assist statically (up to mod assist). When loosing balance, tends towards R and posterior. ?Postural control: Posterior lean, Right lateral lean ?  ?  ?  ?  ?  ?  ?  ?  ?  ?  ?  ?  ?  ?  ?   ? ?ADL either performed or assessed with clinical judgement  ? ?ADL Overall ADL's : Needs assistance/impaired ?  ?Eating/Feeding Details (indicate cue type and reason): spouse reports pt engages minimally in feeding self and holding cup, she is encouraging him to do more--using L hand more than dominant R hand ?Grooming: Maximal assistance;Sitting ?Grooming Details (indicate cue type and reason): +2 when sitting EOB, but max assist to bring hand to face washing face ?  ?  ?  ?  ?  ?  ?  Lower Body Dressing: Total assistance;+2 for physical assistance;+2 for safety/equipment;Bed level ?  ?  ?  ?  ?  ?  ?  ?Functional mobility during ADLs: +2 for physical assistance;+2 for safety/equipment;Maximal assistance ?  ?  ? ?Extremity/Trunk Assessment   ?  ?  ?  ?  ?  ? ?Vision   ?  ?   ?Perception   ?  ?Praxis   ?  ? ?Cognition Arousal/Alertness: Lethargic ?Behavior During Therapy: Flat affect ?Overall Cognitive Status: Impaired/Different from baseline ?Area of Impairment: Attention, Memory, Following commands, Awareness, Problem solving ?  ?  ?  ?  ?  ?  ?  ?  ?  ?Current Attention Level: Focused ?Memory: Decreased short-term memory ?Following Commands: Follows one step commands consistently, Follows one step commands with increased time ?  ?Awareness: Emergent ?Problem Solving: Slow processing, Decreased initiation, Difficulty sequencing, Requires verbal cues, Requires tactile cues ?General Comments: pt lethargic, but improved from previous sessions.  Patient follows simple commands, with increased fatigue requires mulitmodal cueing.  Poor attention, recall and decreased initation. ?  ?  ?   ?Exercises   ? ?  ?Shoulder Instructions   ? ? ?  ?General Comments Discoloration and blisters noted on B feet  ? ? ?Pertinent Vitals/ Pain       Pain Assessment ?Pain Assessment: Faces ?Faces Pain Scale: No hurt ? ?Home Living   ?  ?  ?  ?  ?  ?  ?  ?  ?  ?  ?  ?  ?  ?  ?  ?  ?  ?  ? ?  ?Prior Functioning/Environment    ?  ?  ?  ?   ? ?Frequency ? Min 2X/week  ? ? ? ? ?  ?Progress Toward Goals ? ?OT Goals(current goals can now be found in the care plan section) ? Progress towards OT goals: Progressing toward goals ? ?Acute Rehab OT Goals ?Patient Stated Goal: get better ?OT Goal Formulation: With patient ?Time For Goal Achievement: 03/17/22 ?Potential to Achieve Goals: Good  ?Plan Discharge plan remains appropriate;Frequency remains appropriate   ? ?Co-evaluation ? ? ? PT/OT/SLP Co-Evaluation/Treatment: Yes ?Reason for Co-Treatment: For patient/therapist safety;To address functional/ADL transfers ?  ?OT goals addressed during session: ADL's and self-care ?  ? ?  ?AM-PAC OT "6 Clicks" Daily Activity     ?Outcome Measure ? ? Help from another person eating meals?: A Lot ?Help from another person taking care  of personal grooming?: A Lot ?Help from another person toileting, which includes using toliet, bedpan, or urinal?: Total ?Help from another person bathing (including washing, rinsing, drying)?: Total ?Help from another person to put on and taking off regular upper body clothing?: Total ?Help from another person to put on and taking off regular lower body clothing?: Total ?6 Click Score: 8 ? ?  ?End of Session   ? ?OT Visit Diagnosis: Other abnormalities of gait and mobility (R26.89);Muscle weakness (generalized) (M62.81) ?  ?Activity Tolerance Patient limited by fatigue ?  ?Patient Left in bed;with call bell/phone within reach;with bed alarm set;with family/visitor present ?  ?Nurse Communication Mobility status ?  ? ?   ? ?Time: 1009-1050 ?OT Time Calculation (min): 41 min ? ?Charges: OT General Charges ?$OT Visit: 1 Visit ?OT Treatments ?$Self Care/Home Management : 23-37 mins ? ?Jolaine Artist, OT ?Acute Rehabilitation Services ?Pager [phone] ?Office [phone] ? ? ?Delight Stare ?03/05/2022, 11:49 AM ?

## 2022-03-05 NOTE — Progress Notes (Signed)
?[facility] ?Progress Note  ? ? ?Assessment/ Plan:   ?AKI: in the setting of severe septic shock likely ATN.  Normal baseline ?            - started CRRT 02/26/22 ? - change to 2K bath 02/28/22 with K slightly higher, systemic hep gtt changed to bivalirudin ?            -Patient now off CRRT on 4/11 ? -Tolerated HD on 4/12; plan for HD again today then maintain TTS schedule ? -Monitor for signs of renal recovery ?  ?  ?2.  Septic shock ?            - severe ?            -Now off pressors ? - abx per primary ?            -Source is not definitively clear.  Ongoing fevers ?  ?3.  Scrotal Edema ?            - urology following.  Urology feels this is unlikely the source of his infection ?  ?4.  Acute hypoxic RF ?            - Now extubated significantly improved.  Continue to monitor ? ?5.  Thrombocytopenia/ coagulopathy: abd [place] with cirrhosis, elevated Tbili no schistocytes on smear, likely sepsis related (? Shock liver).  Management per primary thrombocytopenia improving ? ?6.  Anemia: Hemoglobin 7.5. Transfuse as needed. May consider aranesp pending progress ? ? ?7.  Afib with RVR- hep--> bivalirudin gtt -> now holding anticoagulation ? ?8.  Foot wounds: associated with high-dose vasopressor requirements. Vascular surgery consulted ?  ?9. Dispo: Likely moving to the floor ? ?Subjective:   ? ?Continues to have minimal urine output.  Underwent dialysis yesterday with 4 L removed.  Patient states that dialysis wiped him out but tolerated it fairly well otherwise.  Having a lot of pain in his scrotum.  No shortness of breath or chest pain.  ? ?Objective:   ?BP 108/62   Pulse 97   Temp 97.7 ?F (36.5 ?C) (Oral)   Resp 19   Ht '5\' 10"'  (1.778 m)   Wt 110.9 kg   SpO2 99%   BMI 35.08 kg/m?  ? ?Intake/Output Summary (Last 24 hours) at 03/05/2022 0901 ?Last data filed at 03/05/2022 0600 ?Gross per 24 hour  ?Intake 710.42 ml  ?Output 4025 ml  ?Net -3314.58 ml  ? ?Weight change: 0.6 kg ? ?Physical  Exam: ?ZHG:DJMEQ, alert, no distress ?HEENT eyes opened, + icteric  ?PULM bilateral chest rise with no increased work of breathing ?CV Normal rate, no audible rub ?ABD distended, still soft, bowel sounds present ?GU: swollen scrotum with deeply violaceous discoloration with some weeping ?EXT 2 + anasarca, mottled and cool bilateral soles of feet and toes, + jaundiced ?NEURO Awake, alert, oriented to person and place ? ?Imaging: ?VAS [place] ABI WITH/WO TBI ? ?Result Date: 03/04/2022 ? LOWER EXTREMITY DOPPLER STUDY Patient Name:  Jeffrey Ayala  Date of Exam:   03/04/2022 Medical Rec #: [id number]         Accession #:    [id number] Date of Birth: Mar 25, 1961         Patient Gender: M Patient Age:   61 years Exam Location:  [facility] Procedure:      VAS [place] ABI WITH/WO TBI Referring Phys: Harrell Gave DICKSON --------------------------------------------------------------------------------  Indications: Follow up study. Off pressors now.  Comparison Study: prior  02/24/22 Performing Technologist: Archie Patten RVS  Examination Guidelines: A complete evaluation includes at minimum, Doppler waveform signals and systolic blood pressure reading at the level of bilateral brachial, anterior tibial, and posterior tibial arteries, when vessel segments are accessible. Bilateral testing is considered an integral part of a complete examination. Photoelectric Plethysmograph (PPG) waveforms and toe systolic pressure readings are included as required and additional duplex testing as needed. Limited examinations for reoccurring indications may be performed as noted.  ABI Findings: +---------+------------------+-----+---------+---------------------------------+ Right    Rt Pressure (mmHg)IndexWaveform Comment                           +---------+------------------+-----+---------+---------------------------------+ Brachial 116                    triphasic                                   +---------+------------------+-----+---------+---------------------------------+ PTA      178               1.53 biphasic                                   +---------+------------------+-----+---------+---------------------------------+ DP       144               1.24 biphasic                                   +---------+------------------+-----+---------+---------------------------------+ Great Toe                                unable to obtain pressure due to                                           low amplitude waveform            +---------+------------------+-----+---------+---------------------------------+ +---------+------------------+-----+---------+-------+ Left     Lt Pressure (mmHg)IndexWaveform Comment +---------+------------------+-----+---------+-------+ Brachial 115                    triphasic        +---------+------------------+-----+---------+-------+ PTA      160               1.38 triphasic        +---------+------------------+-----+---------+-------+ DP       144               1.24 biphasic         +---------+------------------+-----+---------+-------+ Great Toe78                0.67 Abnormal         +---------+------------------+-----+---------+-------+ +-------+-----------+-----------+------------+------------+ ABI/TBIToday's ABIToday's TBIPrevious ABIPrevious TBI +-------+-----------+-----------+------------+------------+ Right  1.53                                           +-------+-----------+-----------+------------+------------+ Left   1.38       0.67                                +-------+-----------+-----------+------------+------------+  Summary: Right: Resting right ankle-brachial index indicates noncompressible right lower extremity arteries. Left: Resting left ankle-brachial index indicates noncompressible left lower extremity arteries. The left toe-brachial index is abnormal. *See table(s) above for  measurements and observations.  Electronically signed by Monica Martinez MD on 03/04/2022 at 4:33:40 PM.    Final    ? ?Labs: ?BMET ?Recent Labs  ?Lab 03/01/22 ?1552 03/02/22 ?0134 03/02/22 ?0305 03/02/22 ?1556 03/03/22 ?0326 03/03/22 ?1639 03/04/22 ?7227 03/05/22 ?0453  ?NA 136 136 135 134* 136 134* 135 135  ?K 4.6 4.0 3.8 3.8 3.5 3.4* 3.5 3.7  ?CL 102  --  100 100 100 99 101 101  ?CO2 23  --  '23 23 27 24 23 22  ' ?GLUCOSE 119*  --  127* 125* 100* 113* 116* 127*  ?BUN 60*  --  53* 49* 43* 47* 59* 59*  ?CREATININE 2.38*  --  2.28* 2.20* 2.19* 2.71* 3.73* 4.65*  ?CALCIUM 8.0*  --  8.2* 8.1* 8.3* 8.1* 8.1* 8.3*  ?PHOS 5.0*  --  5.1* 4.0 4.0 4.8* 6.8*  6.6* 7.7*  ? ?CBC ?Recent Labs  ?Lab 03/02/22 ?0305 03/03/22 ?0326 03/04/22 ?0316 03/05/22 ?0453  ?WBC 10.4 13.3* 12.6* 13.5*  ?HGB 8.6* 8.3* 7.6* 7.5*  ?HCT 26.3* 25.5* 23.7* 24.3*  ?MCV 91.3 91.7 91.9 93.8  ?PLT 90* 115* 133* 158  ? ? ?Medications:   ? ? amLODipine  10 mg Oral Daily  ? aspirin EC  81 mg Oral Daily  ? chlorhexidine gluconate (MEDLINE KIT)  15 mL Mouth Rinse BID  ? Chlorhexidine Gluconate Cloth  6 each Topical Q0600  ? feeding supplement  237 mL Oral QID  ? folic acid  1 mg Oral Daily  ? guaiFENesin  600 mg Oral BID  ? heparin injection (subcutaneous)  5,000 Units Subcutaneous Q8H  ? insulin aspart  0-15 Units Subcutaneous TID WC  ? insulin aspart  0-5 Units Subcutaneous QHS  ? mouth rinse  15 mL Mouth Rinse BID  ? multivitamin  1 tablet Oral QHS  ? sodium chloride flush  10-40 mL Intracatheter Q12H  ? ? ? ?[name] ? ?03/05/2022, 9:01 AM   ?

## 2022-03-06 ENCOUNTER — Encounter (HOSPITAL_COMMUNITY): Payer: Self-pay | Admitting: Pulmonary Disease

## 2022-03-06 ENCOUNTER — Other Ambulatory Visit: Payer: Self-pay

## 2022-03-06 DIAGNOSIS — R6521 Severe sepsis with septic shock: Secondary | ICD-10-CM | POA: Diagnosis not present

## 2022-03-06 DIAGNOSIS — A419 Sepsis, unspecified organism: Secondary | ICD-10-CM | POA: Diagnosis not present

## 2022-03-06 DIAGNOSIS — I998 Other disorder of circulatory system: Secondary | ICD-10-CM | POA: Diagnosis not present

## 2022-03-06 LAB — RENAL FUNCTION PANEL
Albumin: 2.3 g/dL — ABNORMAL LOW (ref 3.5–5.0)
Anion gap: 11 (ref 5–15)
BUN: 47 mg/dL — ABNORMAL HIGH (ref 6–20)
CO2: 24 mmol/L (ref 22–32)
Calcium: 8.2 mg/dL — ABNORMAL LOW (ref 8.9–10.3)
Chloride: 98 mmol/L (ref 98–111)
Creatinine, Ser: 4.68 mg/dL — ABNORMAL HIGH (ref 0.61–1.24)
GFR, Estimated: 14 mL/min — ABNORMAL LOW (ref >60)
Glucose, Bld: 123 mg/dL — ABNORMAL HIGH (ref 70–99)
Phosphorus: 7.3 mg/dL — ABNORMAL HIGH (ref 2.5–4.6)
Potassium: 3.9 mmol/L (ref 3.5–5.1)
Sodium: 133 mmol/L — ABNORMAL LOW (ref 135–145)

## 2022-03-06 LAB — CBC
HCT: 24.7 % — ABNORMAL LOW (ref 39.0–52.0)
Hemoglobin: 7.9 g/dL — ABNORMAL LOW (ref 13.0–17.0)
MCH: 29.9 pg (ref 26.0–34.0)
MCHC: 32 g/dL (ref 30.0–36.0)
MCV: 93.6 fL (ref 80.0–100.0)
Platelets: 178 10*3/uL (ref 150–400)
RBC: 2.64 MIL/uL — ABNORMAL LOW (ref 4.22–5.81)
RDW: 17.2 % — ABNORMAL HIGH (ref 11.5–15.5)
WBC: 10.7 10*3/uL — ABNORMAL HIGH (ref 4.0–10.5)
nRBC: 0 % (ref 0.0–0.2)

## 2022-03-06 LAB — GLUCOSE, CAPILLARY
Glucose-Capillary: 116 mg/dL — ABNORMAL HIGH (ref 70–99)
Glucose-Capillary: 122 mg/dL — ABNORMAL HIGH (ref 70–99)
Glucose-Capillary: 106 mg/dL — ABNORMAL HIGH (ref 70–99)
Glucose-Capillary: 122 mg/dL — ABNORMAL HIGH (ref 70–99)

## 2022-03-06 LAB — MAGNESIUM: Magnesium: 2.6 mg/dL — ABNORMAL HIGH (ref 1.7–2.4)

## 2022-03-06 MED ORDER — MEDIHONEY WOUND/BURN DRESSING EX PSTE
1.0000 "application " | PASTE | Freq: Every day | CUTANEOUS | Status: DC
  Administered 2022-03-06 – 2022-04-03 (×26): 1 via TOPICAL
  Filled 2022-03-06 (×4): qty 44

## 2022-03-06 MED ORDER — METOPROLOL TARTRATE 25 MG PO TABS
25.0000 mg | ORAL_TABLET | Freq: Two times a day (BID) | ORAL | Status: DC
  Administered 2022-03-06 – 2022-03-07 (×2): 25 mg via ORAL
  Filled 2022-03-06 (×2): qty 1

## 2022-03-06 MED ORDER — DARBEPOETIN ALFA 60 MCG/0.3ML IJ SOSY
60.0000 ug | PREFILLED_SYRINGE | INTRAMUSCULAR | Status: DC
  Administered 2022-03-07 – 2022-03-14 (×2): 60 ug via INTRAVENOUS
  Filled 2022-03-06 (×2): qty 0.3

## 2022-03-06 MED ORDER — MELATONIN 3 MG PO TABS
3.0000 mg | ORAL_TABLET | Freq: Every day | ORAL | Status: DC
  Administered 2022-03-06 – 2022-03-14 (×8): 3 mg via ORAL
  Filled 2022-03-06 (×9): qty 1

## 2022-03-06 MED ORDER — PNEUMOCOCCAL 20-VAL CONJ VACC 0.5 ML IM SUSY
0.5000 mL | PREFILLED_SYRINGE | INTRAMUSCULAR | Status: AC
  Administered 2022-03-07: 0.5 mL via INTRAMUSCULAR
  Filled 2022-03-06: qty 0.5

## 2022-03-06 NOTE — Progress Notes (Signed)
?[facility] ?Progress Note  ? ? ?Assessment/ Plan:   ?AKI: in the setting of severe septic shock likely ATN.  Normal baseline ?            - started CRRT 02/26/22 ? - change to 2K bath 02/28/22 with K slightly higher, systemic hep gtt changed to bivalirudin ?            -Patient now off CRRT on 4/11 ? -Tolerated HD on 4/12 and 4/13 ? -Maintain TTS schedule for now ? -monitor for signs of recovery ?  ?  ?2.  Septic shock ?            - severe initially w/ high pressors requirement now improved. Abx per primary ?  ?3.  Scrotal Edema ?            - urology following.  Urology feels this is unlikely the source of his infection ?  ?4.  Acute hypoxic RF ?            - Now extubated significantly improved.  Continue to monitor ? ?5.  Thrombocytopenia/ coagulopathy: Likely related cirrhosis and sepsis.  Now resolved ? ?6.  Anemia: Hemoglobin 7.9. Will dose aranesp tomorrow w/ HD ? ? ?7.  Afib with RVR- hep--> bivalirudin gtt -> now holding anticoagulation ? ?8.  Foot wounds: associated with high-dose vasopressor requirements. Vascular surgery consulted ?  ?9. Dispo: Floor status ? ?Subjective:   ? ?Urine output minimal.  Ongoing confusion.  Patient continues to have pain in feet as well as scrotum.  Tolerated dialysis yesterday fairly well with only 2 L removed limited by hypotension.  No other issues.  ? ?Objective:   ?BP 133/78 (BP Location: Right Arm)   Pulse (!) 111   Temp 98.3 ?F (36.8 ?C) (Oral)   Resp 18   Ht _0  (1.778 m)   Wt 110.8 kg   SpO2 97%   BMI 35.05 kg/m?  ? ?Intake/Output Summary (Last 24 hours) at 03/06/2022 1004 ?Last data filed at 03/06/2022 0600 ?Gross per 24 hour  ?Intake 562.45 ml  ?Output 2259 ml  ?Net -1696.55 ml  ? ?Weight change: -3.9 kg ? ?Physical Exam: ?LEX:NTZGY, alert, no distress ?HEENT eyes opened, + icteric  ?PULM bilateral chest rise with no increased work of breathing ?CV Normal rate, no audible rub ?ABD distended, still soft, bowel sounds present ?GU: swollen  scrotum ?EXT LEE improved, mottled and cool bilateral soles of feet and toes, + jaundiced ?NEURO Awake, alert, oriented to person and place ? ?Imaging: ?CT PELVIS WO CONTRAST ? ?Result Date: 03/05/2022 ?CLINICAL DATA:  Scrotal swelling or edema. EXAM: CT PELVIS WITHOUT CONTRAST TECHNIQUE: Multidetector CT imaging of the pelvis was performed following the standard protocol without intravenous contrast. RADIATION DOSE REDUCTION: This exam was performed according to the departmental dose-optimization program which includes automated exposure control, adjustment of the mA and/or kV according to patient size and/or use of iterative reconstruction technique. COMPARISON:  CT examination dated February 28, 2022 FINDINGS: Urinary Tract:  Urinary bladder is unremarkable. Bowel:  Bowel loops are normal in caliber.  Rectal tube in place. Vascular/Lymphatic: No pathologically enlarged lymph nodes. Atherosclerotic calcification of aorta and branch vessels which are normal in caliber. Reproductive:  Prostate is normal in size. Other: There is generalized anasarca with subcutaneous edema. There is again marked scrotal thickening with bilateral hydrocele. No evidence of scrotal abscess. Musculoskeletal: Degenerate disc disease of the lower lumbar spine. Hip joints, sacroiliac joints and pubic symphysis are  maintained. No acute fracture or dislocation. No suspicious osseous lesion. IMPRESSION: 1.  Mild generalized anasarca. 2. Persistent marked scrotal skin thickening and moderate bilateral hydrocele. No CT evidence of scrotal abscess. These findings are unchanged from prior CT examination of February 28, 2022. Further evaluation with sonogram would be helpful if clinically warranted. Electronically Signed   By: Keane Police D.O.   On: 03/05/2022 11:50  ? ?VAS [place] ABI WITH/WO TBI ? ?Result Date: 03/04/2022 ? LOWER EXTREMITY DOPPLER STUDY Patient Name:  Jeffrey Ayala  Date of Exam:   03/04/2022 Medical Rec #: [id number]         Accession #:     [id number] Date of Birth: 1961-05-31         Patient Gender: M Patient Age:   61 years Exam Location:  [facility] Procedure:      VAS [place] ABI WITH/WO TBI Referring Phys: Harrell Gave DICKSON --------------------------------------------------------------------------------  Indications: Follow up study. Off pressors now.  Comparison Study: prior 02/24/22 Performing Technologist: Archie Patten RVS  Examination Guidelines: A complete evaluation includes at minimum, Doppler waveform signals and systolic blood pressure reading at the level of bilateral brachial, anterior tibial, and posterior tibial arteries, when vessel segments are accessible. Bilateral testing is considered an integral part of a complete examination. Photoelectric Plethysmograph (PPG) waveforms and toe systolic pressure readings are included as required and additional duplex testing as needed. Limited examinations for reoccurring indications may be performed as noted.  ABI Findings: +---------+------------------+-----+---------+---------------------------------+ Right    Rt Pressure (mmHg)IndexWaveform Comment                           +---------+------------------+-----+---------+---------------------------------+ Brachial 116                    triphasic                                  +---------+------------------+-----+---------+---------------------------------+ PTA      178               1.53 biphasic                                   +---------+------------------+-----+---------+---------------------------------+ DP       144               1.24 biphasic                                   +---------+------------------+-----+---------+---------------------------------+ Great Toe                                unable to obtain pressure due to                                           low amplitude waveform            +---------+------------------+-----+---------+---------------------------------+  +---------+------------------+-----+---------+-------+ Left     Lt Pressure (mmHg)IndexWaveform Comment +---------+------------------+-----+---------+-------+ Brachial 115                    triphasic        +---------+------------------+-----+---------+-------+  PTA      160               1.38 triphasic        +---------+------------------+-----+---------+-------+ DP       144               1.24 biphasic         +---------+------------------+-----+---------+-------+ Great Toe78                0.67 Abnormal         +---------+------------------+-----+---------+-------+ +-------+-----------+-----------+------------+------------+ ABI/TBIToday's ABIToday's TBIPrevious ABIPrevious TBI +-------+-----------+-----------+------------+------------+ Right  1.53                                           +-------+-----------+-----------+------------+------------+ Left   1.38       0.67                                +-------+-----------+-----------+------------+------------+  Summary: Right: Resting right ankle-brachial index indicates noncompressible right lower extremity arteries. Left: Resting left ankle-brachial index indicates noncompressible left lower extremity arteries. The left toe-brachial index is abnormal. *See table(s) above for measurements and observations.  Electronically signed by Monica Martinez MD on 03/04/2022 at 4:33:40 PM.    Final    ? ?Labs: ?BMET ?Recent Labs  ?Lab 03/02/22 ?0305 03/02/22 ?1556 03/03/22 ?0326 03/03/22 ?1639 03/04/22 ?0316 03/05/22 ?4451 03/06/22 ?0530  ?NA 135 134* 136 134* 135 135 133*  ?K 3.8 3.8 3.5 3.4* 3.5 3.7 3.9  ?CL 100 100 100 99 101 101 98  ?CO2 _0 ?GLUCOSE 127* 125* 100* 113* 116* 127* 123*  ?BUN 53* 49* 43* 47* 59* 59* 47*  ?CREATININE 2.28* 2.20* 2.19* 2.71* 3.73* 4.65* 4.68*  ?CALCIUM 8.2* 8.1* 8.3* 8.1* 8.1* 8.3* 8.2*  ?PHOS 5.1* 4.0 4.0 4.8* 6.8*  6.6* 7.7* 7.3*  ? ?CBC ?Recent Labs  ?Lab 03/03/22 ?0326  03/04/22 ?0316 03/05/22 ?0453 03/06/22 ?0530  ?WBC 13.3* 12.6* 13.5* 10.7*  ?HGB 8.3* 7.6* 7.5* 7.9*  ?HCT 25.5* 23.7* 24.3* 24.7*  ?MCV 91.7 91.9 93.8 93.6  ?PLT 115* 133* 158 178  ? ? ?Medications:   ? ? amLODipine  10 mg Oral Daily  ? aspirin EC  81 mg Oral Daily  ? chlorhexidine gluconate (MEDLINE KIT)

## 2022-03-06 NOTE — Progress Notes (Signed)
?  Subjective: ?Patient is resting comfortably with no complaints.  He has not required pain medication since yesterday.  ? ?Objective: ?Vital signs in last 24 hours: ?Temp:  [97.8 ?F (36.6 ?C)-99.6 ?F (37.6 ?C)] 99.6 ?F (37.6 ?C) (04/14 1136) ?Pulse Rate:  [89-124] 114 (04/14 1136) ?Resp:  [6-27] 18 (04/14 1136) ?BP: (87-143)/(51-82) 126/71 (04/14 1136) ?SpO2:  [86 %-99 %] 93 % (04/14 1136) ?Weight:  [110.8 kg] 110.8 kg (04/13 1720) ? ?Intake/Output from previous day: ?04/13 0701 - 04/14 0700 ?In: 592.5 [P.O.:480; IV Piggyback:82.5] ?Out: 2259  ? ?Intake/Output this shift: ?No intake/output data recorded. ? ?Physical Exam:  ?General: Alert but still disoriented ?Gu: Less penoscrotal edema today compared to yesterday.  The discoloration of the anterior portion remains green  No signs of necrosis, flutuence, spreading erythema or crepitus involving the scrotum.  No purulence or exudate with extensive palpation of the scrotal skin.  Skin borders along the lateral side of the scrotum and inguinal creases show no signs of spreading infection  ?Ext: Toes/feet exhibit a similar blue-green discoloration as the scrotum.  ? ?Lab Results: ?Recent Labs  ?  03/04/22 ?0316 03/05/22 ?0453 03/06/22 ?0530  ?HGB 7.6* 7.5* 7.9*  ?HCT 23.7* 24.3* 24.7*  ? ?BMET ?Recent Labs  ?  03/05/22 ?0453 03/06/22 ?0530  ?NA 135 133*  ?K 3.7 3.9  ?CL 101 98  ?CO2 22 24  ?GLUCOSE 127* 123*  ?BUN 59* 47*  ?CREATININE 4.65* 4.68*  ?CALCIUM 8.3* 8.2*  ? ? ? ?Studies/Results: ?CT PELVIS WO CONTRAST ? ?Result Date: 03/05/2022 ?CLINICAL DATA:  Scrotal swelling or edema. EXAM: CT PELVIS WITHOUT CONTRAST TECHNIQUE: Multidetector CT imaging of the pelvis was performed following the standard protocol without intravenous contrast. RADIATION DOSE REDUCTION: This exam was performed according to the departmental dose-optimization program which includes automated exposure control, adjustment of the mA and/or kV according to patient size and/or use of iterative  reconstruction technique. COMPARISON:  CT examination dated February 28, 2022 FINDINGS: Urinary Tract:  Urinary bladder is unremarkable. Bowel:  Bowel loops are normal in caliber.  Rectal tube in place. Vascular/Lymphatic: No pathologically enlarged lymph nodes. Atherosclerotic calcification of aorta and branch vessels which are normal in caliber. Reproductive:  Prostate is normal in size. Other: There is generalized anasarca with subcutaneous edema. There is again marked scrotal thickening with bilateral hydrocele. No evidence of scrotal abscess. Musculoskeletal: Degenerate disc disease of the lower lumbar spine. Hip joints, sacroiliac joints and pubic symphysis are maintained. No acute fracture or dislocation. No suspicious osseous lesion. IMPRESSION: 1.  Mild generalized anasarca. 2. Persistent marked scrotal skin thickening and moderate bilateral hydrocele. No CT evidence of scrotal abscess. These findings are unchanged from prior CT examination of February 28, 2022. Further evaluation with sonogram would be helpful if clinically warranted. Electronically Signed   By: Keane Police D.O.   On: 03/05/2022 11:50   ? ?Assessment/Plan: ?61 year old male with severe sepsis and diffuse scrotal edema ?  ?-Scrotal edema improving.  CT shows no signs of necrotizing fascitis or abscess within the scrotum.  Scrotum likely discolored from previous methylene blue. Continue zosyn.  Will continue to monitor with daily exams  ? ? LOS: 12 days  ? ?Ellison Hughs, MD ?[facility] ?Pager: [phone] ? ?03/06/2022, 3:30 PM  ? ?

## 2022-03-06 NOTE — Consult Note (Signed)
[facility] Nurse Consult Note: ?Patient receiving care in [facility]. In completing this consult remotely, I reviewed the record, images, and spoke with the primary RN Estill Bamberg via telephone. ?Reason for Consult: necrotic feet, pressure injuries ?Wound type: dry gangrene to feet, being followed by Vascular Services--no interventions at this time except to protect from further injury. ?Stage 2 to coccyx--see flowsheet entries for details. ?Scrotal weeping, see photo. ?Pressure Injury POA: No/NA ?Measurement: ?Wound bed: ?Drainage (amount, consistency, odor)  ?Periwound: ?Dressing procedure/placement/frequency: ?Apply Medihoney directly to wound bed of coccyx/sacrum. Cover with size appropriate foam dressing. Change daily. ? ?Position a Dermatherapy pillow case under the scrotum and between thighs and scrotum. ? ?Monitor the wound area(s) for worsening of condition such as: ?Signs/symptoms of infection,  ?Increase in size,  ?Development of or worsening of odor, ?Development of pain, or increased pain at the affected locations.  Notify the medical team if any of these develop. ? ?Thank you for the consult.  Discussed plan of care with the bedside nurse.  [facility] nurse will not follow at this time.  Please re-consult the [facility] team if needed. ? ?Val Riles, RN, MSN, CWOCN, CNS-BC, pager [phone]  ?

## 2022-03-06 NOTE — Progress Notes (Signed)
? ?  VASCULAR SURGERY ASSESSMENT & PLAN:  ? ?BILATERAL LOWER EXTREMITY WOUNDS: His wounds have not changed significantly.  I think this will take some time to demarcate.  I would simply continue to keep the wounds clean and dry.  As noted below, his repeat ABIs showed multiphasic flow in both feet with an ABI greater than 100.  Toe pressure could not be obtained on the right but on the left toe pressure was 78 mmHg which suggest adequate circulation for healing. ? ?SUBJECTIVE:  ? ?No complaints this morning. ? ?PHYSICAL EXAM:  ? ?Vitals:  ? 03/06/22 0600 03/06/22 0647 03/06/22 0748 03/06/22 1136  ?BP: (!) 134/55 130/67 133/78 126/71  ?Pulse: (!) 101 (!) 104 (!) 111 (!) 114  ?Resp: (!) _0 ?Temp:  98.2 ?F (36.8 ?C) 98.3 ?F (36.8 ?C) 99.6 ?F (37.6 ?C)  ?TempSrc:  Oral Oral Axillary  ?SpO2: 93% 93% 97% 93%  ?Weight:      ?Height:      ? ?The wounds on both feet are unchanged. ? ?LABS:  ? ?ARTERIAL DOPPLER STUDY: I have reviewed his arterial Doppler study that was done on 03/04/2022. ? ?On the right side he had a biphasic dorsalis pedis and posterior tibial signal.  ABI was greater than 100.  Toe pressure could not be obtained. ? ?On the left side there was a triphasic posterior tibial signal and a biphasic dorsalis pedis signal.  ABI was greater than 100.  Toe pressure was 78 mmHg.. ? ?Lab Results  ?Component Value Date  ? WBC 10.7 (H) 03/06/2022  ? HGB 7.9 (L) 03/06/2022  ? HCT 24.7 (L) 03/06/2022  ? MCV 93.6 03/06/2022  ? PLT 178 03/06/2022  ? ?Lab Results  ?Component Value Date  ? CREATININE 4.68 (H) 03/06/2022  ? ?Lab Results  ?Component Value Date  ? INR 1.3 (H) 02/26/2022  ? ?CBG (last 3)  ?Recent Labs  ?  03/05/22 ?1743 03/06/22 ?0352 03/06/22 ?1142  ?GLUCAP 118* 116* 122*  ? ? ?PROBLEM LIST:   ? ?Principal Problem: ?  Septic shock ([place]) ?Active Problems: ?  Pressure injury of skin ?  Encounter for central line placement ? ? ?CURRENT MEDS:  ? ? amLODipine  10 mg Oral Daily  ? aspirin EC  81 mg Oral Daily   ? chlorhexidine gluconate (MEDLINE KIT)  15 mL Mouth Rinse BID  ? Chlorhexidine Gluconate Cloth  6 each Topical Q0600  ? [START ON 03/07/2022] darbepoetin (ARANESP) injection - DIALYSIS  60 mcg Intravenous Q Sat-HD  ? feeding supplement  237 mL Oral QID  ? folic acid  1 mg Oral Daily  ? guaiFENesin  600 mg Oral BID  ? heparin injection (subcutaneous)  5,000 Units Subcutaneous Q8H  ? insulin aspart  0-15 Units Subcutaneous TID WC  ? insulin aspart  0-5 Units Subcutaneous QHS  ? leptospermum manuka honey  1 application. Topical Daily  ? mouth rinse  15 mL Mouth Rinse BID  ? metoprolol tartrate  12.5 mg Oral BID  ? multivitamin  1 tablet Oral QHS  ? [START ON 03/07/2022] pneumococcal 20-valent conjugate vaccine  0.5 mL Intramuscular Tomorrow-1000  ? sodium chloride flush  10-40 mL Intracatheter Q12H  ? ? ?Deitra Mayo ?Office: [phone] ?03/06/2022 ? ?

## 2022-03-06 NOTE — Progress Notes (Signed)
?PROGRESS NOTE ? ? ? ?Jeffrey Ayala  KVQ:[id number] DOB: 1961-11-16 [name]: 02/22/2022 ?PCP: Patient, No Pcp Per (Inactive)  ? ? ? ?Brief Narrative:  ? ?Jeffrey Ayala, is a 61 y.o. male, history of arthritis, bilateral knee replacement, gastric ulcer, kidney stones, eosinophilic esophagitis, migraines , does not take any meds regularly , in good health , started to feel unwell on 4/1, he presented to the [facility] ED with a chief complaint of nausea, vomiting, diarrhea and confusion. ?  ?Per  Elray Mcgregor (spouse), on 4/1 patient began complaining of feeling unwell. Wife denies sick contacts, questionable food.   Around 2 PM on 4/2 the patient developed nausea, vomiting, and diarrhea while at a birthday party and soiled himself. EMS was called. When EMS arrived he was found to be in A-fib with a rate of 120-160. ?  ?At the ED he was found to be febrile with a Tmax of 102.9.  A code sepsis was called in the emergency department, blood cultures were drawn, 2 L of IV fluid were given, vanc and cefepime were ordered.  CT chest negative for acute process.  CT abdomen pelvis with and without contrast notable for swollen scrotum concerning for infection, colon filled with diarrhea.  Urology and cardiology were consulted by the ED.  Despite fluid resuscitation the patient developed hypotension requiring vasopressors, shortly after admitted to ICU and was intubated,  ?  ?4/2 Presented to the ED. CT chest negative for acute process.  CT abdomen pelvis with and without contrast notable for swollen scrotum concerning for infection, colon filled with diarrhea.  Urology and cardiology were consulted by the ED. Vanc, cefepime started.  ?4/3 Intubated overnight, brief cardiac arrest. Escalating pressors.  was started on methylene blue. ?4/5 started on methylene blue ?4/6 started on CRRT ?4/8 appears more responsive ?4/9 responsive ?4/11 extubated  ? ?Subjective: ? ?Confused, only oriented to person, on room air ?Wife at bedside ?RN at  bedside doing bladder scan due to minimal urine output ?His abdomen is distended, wife states this is his baseline  ?Has rectal tube in with watery stool ? ?Assessment & Plan: ? Principal Problem: ?  Septic shock ([place]) ?Active Problems: ?  Pressure injury of skin ?  Encounter for central line placement ? ? ?Septic shock, thought due to scrotal cellulitis and possible aspiration pneumonia from n/v ?Acute hypoxic respiratory failure, required intubation, now extubated, on room air ?-Presented with nausea vomiting diarrhea developed at a birthday party, also has scrotal cellulitis on presentation ?-Blood culture no growth, urine culture with multiple species ?-He initially received Vanco cefepime and Flagyl, then zosyn, last dose on 4/14 ?-Extubated and off pressors, transferred to hospitalist service on 4/14 ? ?Acute metabolic encephalopathy ?Has been confused since admission, today he is oriented to person only ?Wife would like to defer head imaging today ?Will check ammonia level  ? ?AKI from septic shock ?Required CRRT, now on iHD ?Monitor urine output ?Management per nephrology ? ?New onset of afib, presents on admission ?No prior h/o Afib ?-echocardiogram with preserved lvef ?-remain in afib, heart rate in low 100's, will increase lopressor for better heart rate control, d/c norvasc ?-Was on heparin then bival for afib, now off anticoagulation, on asa ?-consider consult cardiology for new onset of afib , will discuss with family ? ? ? ?Bilateral foot lesions, possible from pressors ?Management per vascular surgery ? ?Scrotal cellulitis treated with abx ?Followed by urology ?No indication for surgical intervention ? ?Thrombocytopenia, likely from sepsis, resolved ?Anemia  in the setting of acute illness, does not appear to have overt blood loss, monitor hgb ? ?Shock liver, resolving  ?CT ab did not show hepatobiliary abnormality ? ? ? ?Incidental findings on CT scans ?Coronary artery disease ?Splenomegaly, chronic  since 2015 ?Bilateral nonobstructive nephrolithiasis ? ? ? ? ? ? ? ?Nutritional Assessment: ?The patient?s BMI is: Body mass index is 35.05 kg/m?Marland KitchenMarland Kitchen ?Seen by dietician.  I agree with the assessment and plan as outlined below: ?Nutrition Status: ?Nutrition Problem: Inadequate oral intake ?Etiology: inability to eat ?Signs/Symptoms: NPO status ?Interventions: Ensure Enlive (each supplement provides 350kcal and 20 grams of protein), MVI, Other (Comment) (double protein) ? ? ?Skin Assessment: ?I have examined the patient?s skin and I agree with the wound assessment as performed by the wound care RN as outlined below: ?Pressure Injury 02/22/22 Coccyx Medial Stage 2 -  Partial thickness loss of dermis presenting as a shallow open injury with a red, pink wound bed without slough. pink, painful (Active)  ?02/22/22 2330  ?Location: Coccyx  ?Location Orientation: Medial  ?Staging: Stage 2 -  Partial thickness loss of dermis presenting as a shallow open injury with a red, pink wound bed without slough.  ?Wound Description (Comments): pink, painful  ?Present on Admission: Yes  ?Dressing Type Foam - Lift dressing to assess site every shift 03/06/22 0815  ? ? ? ?I have Reviewed nursing notes, Vitals, pain scores, I/o's, Lab results and  imaging results since pt's last encounter, details please see discussion above  ?I ordered the following labs:  ?Unresulted Labs (From admission, onward)  ? ?  Start     Ordered  ? 03/07/22 0500  CBC with Differential/Platelet  Tomorrow morning,   R       ?Question:  Specimen collection method  Answer:  Unit=Unit collect  ? 03/06/22 1900  ? 03/07/22 0500  Hepatic function panel  Tomorrow morning,   R       ?Question:  Specimen collection method  Answer:  Unit=Unit collect  ? 03/06/22 1900  ? 03/07/22 0500  Ammonia  Tomorrow morning,   R       ?Question:  Specimen collection method  Answer:  Unit=Unit collect  ? 03/06/22 1900  ? 02/27/22 0500  Renal function panel (daily at 0500)  Daily,   R      ?Question:  Specimen collection method  Answer:  Unit=Unit collect  ? 02/26/22 1126  ? 02/27/22 0500  Magnesium  Daily,   R     ?Question:  Specimen collection method  Answer:  Unit=Unit collect  ? 02/26/22 1126  ? ?  ?  ? ?  ? ? ? ?DVT prophylaxis: heparin injection 5,000 Units Start: 03/03/22 1400 ?SCDs Start: 02/22/22 2249 ? ? ?Code Status:   Code Status: Full Code ? ?Family Communication: wife at bedside  ?Disposition:  ? ?Status is: Inpatient ? ?Dispo: The patient is from: home ?             Anticipated d/c is to: TBD ?             Anticipated d/c date is: TBD ? ?Antimicrobials:   ?Anti-infectives (From admission, onward)  ? ? Start     Dose/Rate Route Frequency Ordered Stop  ? 03/03/22 2200  piperacillin-tazobactam (ZOSYN) IVPB 2.25 g       ? 2.25 g ?100 mL/hr over 30 Minutes Intravenous Every 8 hours 03/03/22 1607 03/06/22 2359  ? 02/26/22 1800  piperacillin-tazobactam (ZOSYN) IVPB 3.375 g  Status:  Discontinued       ? 3.375 g ?12.5 mL/hr over 240 Minutes Intravenous Every 8 hours 02/26/22 1343 02/26/22 1518  ? 02/26/22 1800  piperacillin-tazobactam (ZOSYN) IVPB 3.375 g  Status:  Discontinued       ? 3.375 g ?100 mL/hr over 30 Minutes Intravenous Every 6 hours 02/26/22 1518 03/03/22 1607  ? 02/25/22 1000  linezolid (ZYVOX) IVPB 600 mg  Status:  Discontinued       ? 600 mg ?300 mL/hr over 60 Minutes Intravenous Every 12 hours 02/25/22 0810 02/26/22 1443  ? 02/25/22 0915  piperacillin-tazobactam (ZOSYN) IVPB 2.25 g  Status:  Discontinued       ? 2.25 g ?100 mL/hr over 30 Minutes Intravenous Every 8 hours 02/25/22 0823 02/26/22 1343  ? 02/24/22 1400  vancomycin (VANCOCIN) IVPB 1000 mg/200 mL premix  Status:  Discontinued       ? 1,000 mg ?200 mL/hr over 60 Minutes Intravenous  Once 02/24/22 1312 02/24/22 1349  ? 02/24/22 0555  ceFEPIme (MAXIPIME) 2 g in sodium chloride 0.9 % 100 mL IVPB  Status:  Discontinued       ? 2 g ?200 mL/hr over 30 Minutes Intravenous Every 24 hours 02/23/22 1335 02/25/22 0823  ?  02/23/22 1343  vancomycin variable dose per unstable renal function (pharmacist dosing)  Status:  Discontinued       ?  Does not apply See admin instructions 02/23/22 1344 02/25/22 0823  ? 02/23/22 0800  vancomy

## 2022-03-06 NOTE — Progress Notes (Signed)
?   03/06/22 2059  ?Assess: MEWS Score  ?Temp 98.6 ?F (37 ?C)  ?BP 127/66  ?Pulse Rate 100  ?ECG Heart Rate (!) 103  ?Resp (!) 22  ?Level of Consciousness Alert  ?SpO2 97 %  ?O2 Device Room Air  ?Patient Activity (if Appropriate) In bed  ?Assess: if the MEWS score is Yellow or Red  ?Were vital signs taken at a resting state? Yes  ?Focused Assessment No change from prior assessment  ?Early Detection of Sepsis Score *See Row Information* Low  ?MEWS guidelines implemented *See Row Information* Yes  ?Treat  ?MEWS Interventions Administered scheduled meds/treatments;Administered prn meds/treatments  ?Pain Scale PAINAD  ?Pain Score 0  ?Take Vital Signs  ?Increase Vital Sign Frequency  Yellow: Q 2hr X 2 then Q 4hr X 2, if remains yellow, continue Q 4hrs  ?Escalate  ?MEWS: Escalate Yellow: discuss with charge nurse/RN and consider discussing with provider and RRT  ?Notify: Charge Nurse/RN  ?Name of Charge Nurse/RN Notified Butch Penny  ?Date Charge Nurse/RN Notified 03/06/22  ?Time Charge Nurse/RN Notified 2348  ?Document  ?Patient Outcome Stabilized after interventions  ? ? ?

## 2022-03-07 ENCOUNTER — Inpatient Hospital Stay (HOSPITAL_COMMUNITY): Payer: BC Managed Care – PPO

## 2022-03-07 DIAGNOSIS — R6521 Severe sepsis with septic shock: Secondary | ICD-10-CM | POA: Diagnosis not present

## 2022-03-07 DIAGNOSIS — I4891 Unspecified atrial fibrillation: Secondary | ICD-10-CM | POA: Diagnosis not present

## 2022-03-07 DIAGNOSIS — A419 Sepsis, unspecified organism: Secondary | ICD-10-CM | POA: Diagnosis not present

## 2022-03-07 LAB — GLUCOSE, CAPILLARY
Glucose-Capillary: 103 mg/dL — ABNORMAL HIGH (ref 70–99)
Glucose-Capillary: 115 mg/dL — ABNORMAL HIGH (ref 70–99)
Glucose-Capillary: 118 mg/dL — ABNORMAL HIGH (ref 70–99)
Glucose-Capillary: 130 mg/dL — ABNORMAL HIGH (ref 70–99)
Glucose-Capillary: 92 mg/dL (ref 70–99)

## 2022-03-07 LAB — CBC WITH DIFFERENTIAL/PLATELET
Abs Immature Granulocytes: 0.8 10*3/uL — ABNORMAL HIGH (ref 0.00–0.07)
Band Neutrophils: 2 %
Basophils Absolute: 0.2 10*3/uL — ABNORMAL HIGH (ref 0.0–0.1)
Basophils Relative: 2 %
Eosinophils Absolute: 0.2 10*3/uL (ref 0.0–0.5)
Eosinophils Relative: 2 %
HCT: 22.2 % — ABNORMAL LOW (ref 39.0–52.0)
Hemoglobin: 7 g/dL — ABNORMAL LOW (ref 13.0–17.0)
Lymphocytes Relative: 8 %
Lymphs Abs: 0.8 10*3/uL (ref 0.7–4.0)
MCH: 29.7 pg (ref 26.0–34.0)
MCHC: 31.5 g/dL (ref 30.0–36.0)
MCV: 94.1 fL (ref 80.0–100.0)
Metamyelocytes Relative: 6 %
Monocytes Absolute: 0.4 10*3/uL (ref 0.1–1.0)
Monocytes Relative: 4 %
Myelocytes: 2 %
Neutro Abs: 7.8 10*3/uL — ABNORMAL HIGH (ref 1.7–7.7)
Neutrophils Relative %: 74 %
Platelets: 183 10*3/uL (ref 150–400)
RBC: 2.36 MIL/uL — ABNORMAL LOW (ref 4.22–5.81)
RDW: 17 % — ABNORMAL HIGH (ref 11.5–15.5)
WBC: 10.2 10*3/uL (ref 4.0–10.5)
nRBC: 0 % (ref 0.0–0.2)
nRBC: 0 /100 WBC

## 2022-03-07 LAB — BLOOD GAS, ARTERIAL
Acid-Base Excess: 1.5 mmol/L (ref 0.0–2.0)
Bicarbonate: 24.7 mmol/L (ref 20.0–28.0)
Drawn by: 164
O2 Saturation: 95.2 %
Patient temperature: 36.4
pCO2 arterial: 33 mmHg (ref 32–48)
pH, Arterial: 7.48 — ABNORMAL HIGH (ref 7.35–7.45)
pO2, Arterial: 63 mmHg — ABNORMAL LOW (ref 83–108)

## 2022-03-07 LAB — RENAL FUNCTION PANEL
Albumin: 2.2 g/dL — ABNORMAL LOW (ref 3.5–5.0)
Anion gap: 15 (ref 5–15)
BUN: 75 mg/dL — ABNORMAL HIGH (ref 6–20)
CO2: 21 mmol/L — ABNORMAL LOW (ref 22–32)
Calcium: 8 mg/dL — ABNORMAL LOW (ref 8.9–10.3)
Chloride: 100 mmol/L (ref 98–111)
Creatinine, Ser: 7.12 mg/dL — ABNORMAL HIGH (ref 0.61–1.24)
GFR, Estimated: 8 mL/min — ABNORMAL LOW (ref >60)
Glucose, Bld: 120 mg/dL — ABNORMAL HIGH (ref 70–99)
Phosphorus: 10.1 mg/dL — ABNORMAL HIGH (ref 2.5–4.6)
Potassium: 4.1 mmol/L (ref 3.5–5.1)
Sodium: 136 mmol/L (ref 135–145)

## 2022-03-07 LAB — HEPATIC FUNCTION PANEL
ALT: 78 U/L — ABNORMAL HIGH (ref 0–44)
AST: 46 U/L — ABNORMAL HIGH (ref 15–41)
Albumin: 2.2 g/dL — ABNORMAL LOW (ref 3.5–5.0)
Alkaline Phosphatase: 266 U/L — ABNORMAL HIGH (ref 38–126)
Bilirubin, Direct: 1.4 mg/dL — ABNORMAL HIGH (ref 0.0–0.2)
Indirect Bilirubin: 2 mg/dL — ABNORMAL HIGH (ref 0.3–0.9)
Total Bilirubin: 3.4 mg/dL — ABNORMAL HIGH (ref 0.3–1.2)
Total Protein: 6.1 g/dL — ABNORMAL LOW (ref 6.5–8.1)

## 2022-03-07 LAB — AMMONIA: Ammonia: 19 umol/L (ref 9–35)

## 2022-03-07 LAB — MAGNESIUM: Magnesium: 2.7 mg/dL — ABNORMAL HIGH (ref 1.7–2.4)

## 2022-03-07 MED ORDER — SEVELAMER CARBONATE 800 MG PO TABS
800.0000 mg | ORAL_TABLET | Freq: Three times a day (TID) | ORAL | Status: DC
  Administered 2022-03-07 – 2022-03-09 (×4): 800 mg via ORAL
  Filled 2022-03-07 (×4): qty 1

## 2022-03-07 MED ORDER — METOPROLOL TARTRATE 25 MG PO TABS
25.0000 mg | ORAL_TABLET | Freq: Three times a day (TID) | ORAL | Status: DC
  Administered 2022-03-07 – 2022-03-09 (×6): 25 mg via ORAL
  Filled 2022-03-07 (×5): qty 1

## 2022-03-07 NOTE — Consult Note (Addendum)
?Cardiology Consultation:  ? ?Patient ID: Jeffrey Ayala ?MRN: [id number]; DOB: [birth year] ? ?Admit date: 02/22/2022 ?Date of Consult: 03/07/2022 ? ?PCP:  Patient, No Pcp Per (Inactive) ?  ?[facility] Providers ?Cardiologist:  Candee Furbish, MD  ? ?Patient Profile:  ? ?Jeffrey Ayala is a 61 y.o. male with a history of gastric ulcers, eosinophilic esophagitis, arthritis, and migraines who is being seen 03/07/2022 for the evaluation of atrial fibrillation at the request of Dr. Erlinda Hong. ? ?History of Present Illness:  ? ?Jeffrey Ayala is a 61 year old male with the above history.  Patient has no known prior cardiac history.  He is previously seen by Dr. Marlou Porch and 10/2018 for preop evaluation before knee surgery and was felt to be at acceptable risk for surgery.  He did have some lightheadedness/dizziness following surgery and was found to have frequent PACs/PVCs but symptoms improved once epidural was removed.  He has not been seen by Cardiology since that time. ? ?Patient presented on 02/22/2022 with fever, nausea, vomiting, and diarrhea and ended up being admitted with septic shock. Abdominal/pelvic CT was notable for swollen scrotum concerning for infection and colon filled with diarrhea. No clear etiology but thought to be due to scrotal cellulitis and possible aspiration pneumonia. Urology was consulted and has been following. He was started on pressors, IV fluids, and IV antibiotics. He was also noted to be in atrial fibrillation with RVR on admission which was felt to likely be secondary to sepsis. Hospitalization has been complicated by acute hypoxic respiratory arrest requiring intubation on 4/3 (extubated 4/11), brief cardiac arrest post-intubation, and AKI requiring CRRT and now intermittent hemodialysis, acute metabolic encephalopathy, and bilateral foot wounds felt to be secondary to pressors. Echo on 02/23/2022 showed LVEF of 60-65% with no regional wall motion abnormalities. RV normal and no significant valvular  disease. Initial ABIs on 4/4 showed significantly reduced ABI of 0.37 on the right indicative of severe right lower extremity arterial disease and non-compressible left lower extremity arteries on the left. However, repeat ABIs on 4/12 normalization on the right to 1.53. ? ?Cardiology consulted today for assistance of new onset atrial fibrillation. At the time of this evaluation, patient resting comfortably in no acute distress.  He is still very confused.  Wife is at bedside and assists with history. She states patient had a really good day but today he seems much more tired and it is like he has taken "5 big steps back." He just got back from dialysis. It sounds like he was doing very well from a cardiac standpoint prior to coming in with no complaints of chest pain, shortness of breath, edema, or syncope.  Has never been diagnosed with abnormal rhythm before.  Wife states he was very active at home prior to this. Wife states that he has never had a stroke before but is worried that he may of had one now because his speech as been garbled since being extubated. Primary team has offered to get imaging of the head but wife does not know if it will change anything. ? ?Heart rates currently in the low 100s but has high as the 130s during dialysis and right after dialysis. Rates improved with dose of PO Lopressor and IV Lopressor. BP low in dialysis but otherwise OK. Hemoglobin dropped from 7.9 to 7.0 today. ? ?Past Medical History:  ?Diagnosis Date  ? Concussion   ? Eosinophilic esophagitis   ? Gastric ulcer   ? History of kidney stones   ?  Migraine   ? ? ?Past Surgical History:  ?Procedure Laterality Date  ? CYSTOSCOPY    ? DENTAL SURGERY    ? FINGER SURGERY    ? HERNIA REPAIR    ? at age 48  ? KNEE ARTHROSCOPY Left   ? LITHOTRIPSY    ? NEPHROLITHOTOMY    ? TONSILLECTOMY    ? TOTAL KNEE ARTHROPLASTY Bilateral 11/04/2018  ? Procedure: BILATERAL TOTAL KNEE ARTHROPLASTY;  Surgeon: [name], MD;  Location: [place];   Service: Orthopedics;  Laterality: Bilateral;  spinal/epidural per anesthesiologist  ?  ? ?Home Medications:  ?Prior to Admission medications   ?Medication Sig Start Date End Date Taking? Authorizing Provider  ?acetaminophen (TYLENOL) 650 MG CR tablet Take 650 mg by mouth every 8 (eight) hours as needed for pain or fever.   Yes [provider]  ?Calcium Carbonate-Vitamin D (OSCAL 500 D-3 PO) Take 1 tablet by mouth daily.   Yes [provider]  ?naproxen sodium (ALEVE) 220 MG tablet Take 220 mg by mouth daily as needed (pain.).   Yes [provider]  ?OVER THE COUNTER MEDICATION Take 1 capsule by mouth daily. Prostate vitamin   Yes [provider]  ?allopurinol (ZYLOPRIM) 100 MG tablet Take 1 tablet (100 mg total) by mouth daily. ?Patient not taking: Reported on 02/22/2022 11/14/18   Rayburn, Neta Mends, PA-C  ?baclofen (LIORESAL) 10 MG tablet TAKE 1 TABLET BY MOUTH THREE TIMES A DAY AS NEEDED ?Patient not taking: Reported on 02/22/2022 09/02/20   Yetta Flock, MD  ?colchicine-probenecid 0.5-500 MG tablet Take 1 tablet by mouth 2 (two) times daily. ?Patient not taking: Reported on 02/22/2022 11/14/18   Rayburn, Neta Mends, PA-C  ?fluticasone (FLOVENT HFA) 220 MCG/ACT inhaler Inhale 2 puffs into the lungs in the morning and at bedtime. Do not eat or drink 30 minutes after inhalation ?Patient not taking: Reported on 02/22/2022 02/12/20   Yetta Flock, MD  ?pantoprazole (PROTONIX) 40 MG tablet TAKE 1 TABLET BY MOUTH TWICE A DAY ?Patient not taking: Reported on 02/22/2022 07/01/20   Armbruster, Carlota Raspberry, MD  ? ? ?Inpatient Medications: ?Scheduled Meds: ? aspirin EC  81 mg Oral Daily  ? chlorhexidine gluconate (MEDLINE KIT)  15 mL Mouth Rinse BID  ? Chlorhexidine Gluconate Cloth  6 each Topical Q0600  ? darbepoetin (ARANESP) injection - DIALYSIS  60 mcg Intravenous Q Sat-HD  ? feeding supplement  237 mL Oral QID  ? folic acid  1 mg Oral Daily  ? guaiFENesin  600 mg Oral  BID  ? heparin injection (subcutaneous)  5,000 Units Subcutaneous Q8H  ? insulin aspart  0-15 Units Subcutaneous TID WC  ? insulin aspart  0-5 Units Subcutaneous QHS  ? leptospermum manuka honey  1 application. Topical Daily  ? mouth rinse  15 mL Mouth Rinse BID  ? melatonin  3 mg Oral QHS  ? metoprolol tartrate  25 mg Oral BID  ? multivitamin  1 tablet Oral QHS  ? sevelamer carbonate  800 mg Oral TID WC  ? sodium chloride flush  10-40 mL Intracatheter Q12H  ? ?Continuous Infusions: ? sodium chloride Stopped (03/06/22 2355)  ? ?PRN Meds: ?acetaminophen, docusate sodium, metoprolol tartrate, oxyCODONE, phenol, polyethylene glycol, sodium chloride flush, white petrolatum ? ?Allergies:    ?Allergies  ?Allergen Reactions  ? Codeine Hives  ?  Insomnia, jittery  ? Flomax [Tamsulosin Hcl] Nausea Only  ?  Nausea and fatigue  ? ? ?Social History:   ?Social History  ? ?  Socioeconomic History  ? Marital status: Married  ?  Spouse name: Not on file  ? Number of children: Not on file  ? Years of education: Not on file  ? Highest education level: Not on file  ?Occupational History  ? Not on file  ?Tobacco Use  ? Smoking status: Never  ? Smokeless tobacco: Never  ?Vaping Use  ? Vaping Use: Never used  ?Substance and Sexual Activity  ? Alcohol use: Yes  ?  Comment: occassional  ? Drug use: No  ? Sexual activity: Not on file  ?Other Topics Concern  ? Not on file  ?Social History Narrative  ? Not on file  ? ?Social Determinants of Health  ? ?Financial Resource Strain: Not on file  ?Food Insecurity: Not on file  ?Transportation Needs: Not on file  ?Physical Activity: Not on file  ?Stress: Not on file  ?Social Connections: Not on file  ?Intimate Partner Violence: Not on file  ?  ?Family History:   ?Family History  ?Problem Relation Age of Onset  ? COPD Mother   ? Sarcoidosis Brother   ? Cancer Brother   ? Stomach cancer Neg Hx   ? Pancreatic cancer Neg Hx   ? Esophageal cancer Neg Hx   ? Colon cancer Neg Hx   ? Rectal cancer Neg Hx   ?   ? ?ROS:  ?Please see the history of present illness.  ?Review of Systems  ?Unable to perform ROS: Mental status change  ? ?Physical Exam/Data:  ? ?Vitals:  ? 03/07/22 1027 03/07/22 1054 03/07/22 1202 0

## 2022-03-07 NOTE — Procedures (Signed)
removed 2052ms net fluid unable to remove more due to hypotension and HR in the 140s to 150s.  pre bp  137/76  post bp 165/96  pre weight bed scales innacurate 125.6kg post weight 123.5kg .  gave aranesp as ordered. catheter ran well packed with heparin clamped and capped ?

## 2022-03-07 NOTE — Progress Notes (Incomplete)
Patient seen and discussed with PA Sarajane Jews, I agree with her documentation. 61 yo male history gatric ulcers/esophgaitis admitted 02/22/22 with severe septic shock, respiratory failure initally on ventilator now extubated, and AKI secondary to ATN requiring CRRT now as needed HD. Cariology consulted for new onset afib thsi admission in setting of severe systemic illnes.  ? ? ? ?02/2022 Echo: LVEF 60-65%, no WMAs,  ?Cr 7.12 BUN 75 Mg 2.7 WBC 10.2 Hgb 7 Plt 183  ? ?New onset afib in the setting of severe systemic illness. From 02/27/22 pharmacy notes appears low AT3 levels despite heparin, elevated PTT and INR. Changed from heparin to bivalirudin for a period of time. CHADS2Vasc score is 0, now just on ASA. Currently on lopressor '25mg'$  bid, rates low 100s though increased during HD today.  ?- from notes may require tunneled HD catheter. I think off anticoag is fine given low CHADS2Vasc and need for invasive procedures at this time, would likely start eliquis at discharge in anticipation for outpatient cardioversion if he remains in afib given new onset afib that has been peristent. Would need 3 of of anticoag prior and 4 weeks after DCCV.  Will increase oral lopressor to '25mg'$  tid and monitor rate ? ? ?Carlyle Dolly MD ? ? ?

## 2022-03-07 NOTE — Plan of Care (Signed)
?  Problem: Education: ?Goal: Knowledge of General Education information will improve ?Description: Including pain rating scale, medication(s)/side effects and non-pharmacologic comfort measures ?Outcome: Progressing ?  ?Problem: Health Behavior/Discharge Planning: ?Goal: Ability to manage health-related needs will improve ?Outcome: Progressing ?  ?Problem: Clinical Measurements: ?Goal: Ability to maintain clinical measurements within normal limits will improve ?Outcome: Progressing ?Goal: Will remain free from infection ?Outcome: Progressing ?Goal: Diagnostic test results will improve ?Outcome: Progressing ?Goal: Respiratory complications will improve ?Outcome: Progressing ?Goal: Cardiovascular complication will be avoided ?Outcome: Progressing ?  ?Problem: Activity: ?Goal: Risk for activity intolerance will decrease ?Outcome: Progressing ?  ?Problem: Nutrition: ?Goal: Adequate nutrition will be maintained ?Outcome: Progressing ?  ?Problem: Coping: ?Goal: Level of anxiety will decrease ?Outcome: Progressing ?  ?Problem: Elimination: ?Goal: Will not experience complications related to bowel motility ?Outcome: Progressing ?Goal: Will not experience complications related to urinary retention ?Outcome: Progressing ?  ?Problem: Pain Managment: ?Goal: General experience of comfort will improve ?Outcome: Progressing ?  ?Problem: Safety: ?Goal: Ability to remain free from injury will improve ?Outcome: Progressing ?  ?Problem: Skin Integrity: ?Goal: Risk for impaired skin integrity will decrease ?Outcome: Progressing ?  ?Problem: Fluid Volume: ?Goal: Hemodynamic stability will improve ?Outcome: Progressing ?  ?Problem: Clinical Measurements: ?Goal: Diagnostic test results will improve ?Outcome: Progressing ?Goal: Signs and symptoms of infection will decrease ?Outcome: Progressing ?  ?Problem: Respiratory: ?Goal: Ability to maintain adequate ventilation will improve ?Outcome: Progressing ?  ?Problem: Activity: ?Goal: Ability  to tolerate increased activity will improve ?Outcome: Progressing ?  ?Problem: Respiratory: ?Goal: Ability to maintain a clear airway and adequate ventilation will improve ?Outcome: Progressing ?  ?Problem: Role Relationship: ?Goal: Method of communication will improve ?Outcome: Progressing ?  ?

## 2022-03-07 NOTE — Progress Notes (Addendum)
?[name] ?Progress Note  ? ? ?Assessment/ Plan:   ?AKI: in the setting of severe septic shock likely ATN.  Normal kidney function baseline ?            - started CRRT 02/26/22 ? - change to 2K bath 02/28/22 with K slightly higher, systemic hep gtt changed to bivalirudin ?            -Patient now off CRRT on 4/11 ? -As tolerated dialysis without significant issues ? -Maintain TTS schedule for now ? -monitor for signs of recovery ? -Essentially no urine output at this time.  May want to pursue tunneled dialysis catheter next week and start arranging outpatient dialysis pending his rehab plan ?  ?  ?2.  Septic shock ?            - severe initially w/ high pressors requirement now improved.  Has now completed antibiotics ?  ?3.  Scrotal Edema ?            - urology following.  Urology feels this is unlikely the source of his infection ?  ?4.  Acute hypoxic RF ?            - Now extubated significantly improved.  Continue to monitor ? ?5.  Thrombocytopenia/ coagulopathy: Likely related cirrhosis and sepsis.  Now resolved ? ?6.  Anemia: Hemoglobin 7. providing urinalysis today.  Transfusions for hemoglobin less than 7 as needed ? ? ?7.  Afib with RVR- hep--> bivalirudin gtt -> now holding anticoagulation ? ?8.  Foot wounds: associated with high-dose vasopressor requirements. Vascular surgery consulted ? ?9. Hyperphosphatemia: started sevelamer today ?  ?10. Dispo: Floor status.  May need to start process of arranging outpatient hemodialysis.  Rehabilitation will also be a factor ? ?Subjective:   ? ?Patient feels well today with no complaints.  Tolerating dialysis  ? ?Objective:   ?BP 114/65   Pulse (!) 114   Temp 98.5 ?F (36.9 ?C)   Resp (!) 24   Ht '5\' 10"'  (1.778 m)   Wt 125.6 kg   SpO2 94%   BMI 39.75 kg/m?  ? ?Intake/Output Summary (Last 24 hours) at 03/07/2022 0839 ?Last data filed at 03/07/2022 0600 ?Gross per 24 hour  ?Intake 90.3 ml  ?Output 700 ml  ?Net -609.7 ml  ? ?Weight change: 14.2  kg ? ?Physical Exam: ?HFW:YOVZC, alert, no distress ?HEENT eyes opened, + icteric  ?PULM bilateral chest rise with no increased work of breathing ?CV Normal rate, no audible rub ?ABD distended, still soft, bowel sounds present ?GU: swollen scrotum ?EXT LEE improved, mottled and cool bilateral soles of feet and toes, + jaundiced ?NEURO Awake, alert, oriented to person and place ? ?Imaging: ?CT PELVIS WO CONTRAST ? ?Result Date: 03/05/2022 ?CLINICAL DATA:  Scrotal swelling or edema. EXAM: CT PELVIS WITHOUT CONTRAST TECHNIQUE: Multidetector CT imaging of the pelvis was performed following the standard protocol without intravenous contrast. RADIATION DOSE REDUCTION: This exam was performed according to the departmental dose-optimization program which includes automated exposure control, adjustment of the mA and/or kV according to patient size and/or use of iterative reconstruction technique. COMPARISON:  CT examination dated February 28, 2022 FINDINGS: Urinary Tract:  Urinary bladder is unremarkable. Bowel:  Bowel loops are normal in caliber.  Rectal tube in place. Vascular/Lymphatic: No pathologically enlarged lymph nodes. Atherosclerotic calcification of aorta and branch vessels which are normal in caliber. Reproductive:  Prostate is normal in size. Other: There is generalized anasarca with subcutaneous edema. There  is again marked scrotal thickening with bilateral hydrocele. No evidence of scrotal abscess. Musculoskeletal: Degenerate disc disease of the lower lumbar spine. Hip joints, sacroiliac joints and pubic symphysis are maintained. No acute fracture or dislocation. No suspicious osseous lesion. IMPRESSION: 1.  Mild generalized anasarca. 2. Persistent marked scrotal skin thickening and moderate bilateral hydrocele. No CT evidence of scrotal abscess. These findings are unchanged from prior CT examination of February 28, 2022. Further evaluation with sonogram would be helpful if clinically warranted. Electronically Signed    By: Jeffrey Ayala D.O.   On: 03/05/2022 11:50   ? ?Labs: ?BMET ?Recent Labs  ?Lab 03/02/22 ?1556 03/03/22 ?0326 03/03/22 ?1639 03/04/22 ?0316 03/05/22 ?4174 03/06/22 ?0530 03/07/22 ?0500  ?NA 134* 136 134* 135 135 133* 136  ?K 3.8 3.5 3.4* 3.5 3.7 3.9 4.1  ?CL 100 100 99 101 101 98 100  ?CO2 '23 27 24 23 22 24 ' 21*  ?GLUCOSE 125* 100* 113* 116* 127* 123* 120*  ?BUN 49* 43* 47* 59* 59* 47* 75*  ?CREATININE 2.20* 2.19* 2.71* 3.73* 4.65* 4.68* 7.12*  ?CALCIUM 8.1* 8.3* 8.1* 8.1* 8.3* 8.2* 8.0*  ?PHOS 4.0 4.0 4.8* 6.8*  6.6* 7.7* 7.3* 10.1*  ? ?CBC ?Recent Labs  ?Lab 03/04/22 ?0316 03/05/22 ?0453 03/06/22 ?0530 03/07/22 ?0500  ?WBC 12.6* 13.5* 10.7* 10.2  ?NEUTROABS  --   --   --  7.8*  ?HGB 7.6* 7.5* 7.9* 7.0*  ?HCT 23.7* 24.3* 24.7* 22.2*  ?MCV 91.9 93.8 93.6 94.1  ?PLT 133* 158 178 183  ? ? ?Medications:   ? ? aspirin EC  81 mg Oral Daily  ? chlorhexidine gluconate (MEDLINE KIT)  15 mL Mouth Rinse BID  ? Chlorhexidine Gluconate Cloth  6 each Topical Q0600  ? darbepoetin (ARANESP) injection - DIALYSIS  60 mcg Intravenous Q Sat-HD  ? feeding supplement  237 mL Oral QID  ? folic acid  1 mg Oral Daily  ? guaiFENesin  600 mg Oral BID  ? heparin injection (subcutaneous)  5,000 Units Subcutaneous Q8H  ? insulin aspart  0-15 Units Subcutaneous TID WC  ? insulin aspart  0-5 Units Subcutaneous QHS  ? leptospermum manuka honey  1 application. Topical Daily  ? mouth rinse  15 mL Mouth Rinse BID  ? melatonin  3 mg Oral QHS  ? metoprolol tartrate  25 mg Oral BID  ? multivitamin  1 tablet Oral QHS  ? pneumococcal 20-valent conjugate vaccine  0.5 mL Intramuscular Tomorrow-1000  ? sevelamer carbonate  800 mg Oral TID WC  ? sodium chloride flush  10-40 mL Intracatheter Q12H  ? ? ? ?[name] ? ?03/07/2022, 8:39 AM   ?

## 2022-03-07 NOTE — Progress Notes (Addendum)
?PROGRESS NOTE ? ? ? ?Jeffrey Ayala  OHY:[id number] DOB: Apr 21, 1961 [name]: 02/22/2022 ?PCP: Patient, No Pcp Per (Inactive)  ? ? ? ?Brief Narrative:  ? ?Jeffrey Ayala, is a 61 y.o. male, history of arthritis, bilateral knee replacement, gastric ulcer, kidney stones, eosinophilic esophagitis, migraines , does not take any meds regularly , in good health , started to feel unwell on 4/1, he presented to the [facility] ED with a chief complaint of nausea, vomiting, diarrhea and confusion. ?  ?Per  Elray Mcgregor (spouse), on 4/1 patient began complaining of feeling unwell. Wife denies sick contacts, questionable food.   Around 2 PM on 4/2 the patient developed nausea, vomiting, and diarrhea while at a birthday party and soiled himself. EMS was called. When EMS arrived he was found to be in A-fib with a rate of 120-160. ?  ?At the ED he was found to be febrile with a Tmax of 102.9.  A code sepsis was called in the emergency department, blood cultures were drawn, 2 L of IV fluid were given, vanc and cefepime were ordered.  CT chest negative for acute process.  CT abdomen pelvis with and without contrast notable for swollen scrotum concerning for infection, colon filled with diarrhea.  Urology and cardiology were consulted by the ED.  Despite fluid resuscitation the patient developed hypotension requiring vasopressors, shortly after admitted to ICU and was intubated,  ?  ?4/2 Presented to the ED. CT chest negative for acute process.  CT abdomen pelvis with and without contrast notable for swollen scrotum concerning for infection, colon filled with diarrhea.  Urology and cardiology were consulted by the ED. Vanc, cefepime started.  ?4/3 Intubated overnight, brief cardiac arrest. Escalating pressors.  was started on methylene blue. ?4/5 started on methylene blue ?4/6 started on CRRT ?4/8 appears more responsive ?4/9 responsive ?4/11 extubated  ? ?Subjective: ? ?He is seen after returned from HD ?He remains Confused, only oriented to  person, on room air ?Wife at bedside ? minimal urine output ?His abdomen is distended, wife states this is his baseline  ?Has rectal tube in with watery stool ? ?Assessment & Plan: ? Principal Problem: ?  Septic shock ([place]) ?Active Problems: ?  Pressure injury of skin ?  Encounter for central line placement ? ? ?Septic shock, thought due to scrotal cellulitis and possible aspiration pneumonia from n/v ?Acute hypoxic respiratory failure, required intubation, now extubated, on room air ?-Presented with nausea vomiting diarrhea developed at a birthday party, also has scrotal cellulitis on presentation ?-Blood culture no growth, urine culture with multiple species ?-He initially received Vanco cefepime and Flagyl, then zosyn, last dose on 4/14 ?-Extubated and off pressors, transferred to hospitalist service on 4/14 ? ?Acute metabolic encephalopathy ?Has been confused since admission, today he is oriented to person only ?Wife would like to defer head imaging today ? ammonia level unremarkable ? ?AKI from septic shock ?Required CRRT, now on iHD ?Monitor urine output ?Management per nephrology ? ?New onset of afib, presents on admission ?No prior h/o Afib ?-echocardiogram with preserved lvef ?-remain in afib ?-lopressor dose increased for better heart rate control, norvasc discontinued  ?-Was on heparin then bival for afib, now off anticoagulation, on asa ?-cardiology consult for new onset of afib ? ? ? ?Bilateral foot lesions, possible from pressors ?Management per vascular surgery ? ?Scrotal cellulitis treated with abx ?Followed by urology ?No indication for surgical intervention ? ?Thrombocytopenia, likely from sepsis, resolved ?Anemia in the setting of acute illness, does not appear  to have overt blood loss, monitor hgb, if need prbc transfusion, it is better to give during HD unless needs it urgently  ? ?Shock liver, resolving  ?CT ab did not show hepatobiliary abnormality ? ? ? ?Incidental findings on CT  scans ?Coronary artery disease ?Splenomegaly, chronic since 2015 ?Bilateral nonobstructive nephrolithiasis ? ? ? ?Nutritional Assessment: ?The patient?s BMI is: Body mass index is 38.02 kg/m?Marland KitchenMarland Kitchen ?Seen by dietician.  I agree with the assessment and plan as outlined below: ?Nutrition Status: ?Nutrition Problem: Inadequate oral intake ?Etiology: inability to eat ?Signs/Symptoms: NPO status ?Interventions: Ensure Enlive (each supplement provides 350kcal and 20 grams of protein), MVI, Other (Comment) (double protein) ? ? ?Skin Assessment: ?I have examined the patient?s skin and I agree with the wound assessment as performed by the wound care RN as outlined below: ?Pressure Injury 02/22/22 Coccyx Medial Stage 2 -  Partial thickness loss of dermis presenting as a shallow open injury with a red, pink wound bed without slough. pink, painful (Active)  ?02/22/22 2330  ?Location: Coccyx  ?Location Orientation: Medial  ?Staging: Stage 2 -  Partial thickness loss of dermis presenting as a shallow open injury with a red, pink wound bed without slough.  ?Wound Description (Comments): pink, painful  ?Present on Admission: Yes  ?Dressing Type Foam - Lift dressing to assess site every shift 03/07/22 1054  ? ? ? ?I have Reviewed nursing notes, Vitals, pain scores, I/o's, Lab results and  imaging results since pt's last encounter, details please see discussion above  ?I ordered the following labs:  ?Unresulted Labs (From admission, onward)  ? ?  Start     Ordered  ? 02/27/22 0500  Renal function panel (daily at 0500)  Daily,   R     ?Question:  Specimen collection method  Answer:  Unit=Unit collect  ? 02/26/22 1126  ? 02/27/22 0500  Magnesium  Daily,   R     ?Question:  Specimen collection method  Answer:  Unit=Unit collect  ? 02/26/22 1126  ? ?  ?  ? ?  ? ? ? ?DVT prophylaxis: heparin injection 5,000 Units Start: 03/03/22 1400 ?SCDs Start: 02/22/22 2249 ? ? ?Code Status:   Code Status: Full Code ? ?Family Communication: wife at bedside   ?Disposition:  ? ?Status is: Inpatient ? ?Dispo: The patient is from: home ?             Anticipated d/c is to: TBD ?             Anticipated d/c date is: TBD ? ?Antimicrobials:   ?Anti-infectives (From admission, onward)  ? ? Start     Dose/Rate Route Frequency Ordered Stop  ? 03/03/22 2200  piperacillin-tazobactam (ZOSYN) IVPB 2.25 g       ? 2.25 g ?100 mL/hr over 30 Minutes Intravenous Every 8 hours 03/03/22 1607 03/06/22 2359  ? 02/26/22 1800  piperacillin-tazobactam (ZOSYN) IVPB 3.375 g  Status:  Discontinued       ? 3.375 g ?12.5 mL/hr over 240 Minutes Intravenous Every 8 hours 02/26/22 1343 02/26/22 1518  ? 02/26/22 1800  piperacillin-tazobactam (ZOSYN) IVPB 3.375 g  Status:  Discontinued       ? 3.375 g ?100 mL/hr over 30 Minutes Intravenous Every 6 hours 02/26/22 1518 03/03/22 1607  ? 02/25/22 1000  linezolid (ZYVOX) IVPB 600 mg  Status:  Discontinued       ? 600 mg ?300 mL/hr over 60 Minutes Intravenous Every 12 hours 02/25/22 0810 02/26/22 1443  ?  02/25/22 0915  piperacillin-tazobactam (ZOSYN) IVPB 2.25 g  Status:  Discontinued       ? 2.25 g ?100 mL/hr over 30 Minutes Intravenous Every 8 hours 02/25/22 0823 02/26/22 1343  ? 02/24/22 1400  vancomycin (VANCOCIN) IVPB 1000 mg/200 mL premix  Status:  Discontinued       ? 1,000 mg ?200 mL/hr over 60 Minutes Intravenous  Once 02/24/22 1312 02/24/22 1349  ? 02/24/22 0555  ceFEPIme (MAXIPIME) 2 g in sodium chloride 0.9 % 100 mL IVPB  Status:  Discontinued       ? 2 g ?200 mL/hr over 30 Minutes Intravenous Every 24 hours 02/23/22 1335 02/25/22 0823  ? 02/23/22 1343  vancomycin variable dose per unstable renal function (pharmacist dosing)  Status:  Discontinued       ?  Does not apply See admin instructions 02/23/22 1344 02/25/22 0823  ? 02/23/22 0800  vancomycin (VANCOREADY) IVPB 750 mg/150 mL  Status:  Discontinued       ? 750 mg ?150 mL/hr over 60 Minutes Intravenous Every 12 hours 02/22/22 1853 02/23/22 0731  ? 02/22/22 2200  ceFEPIme (MAXIPIME) 2 g in sodium  chloride 0.9 % 100 mL IVPB  Status:  Discontinued       ? 2 g ?200 mL/hr over 30 Minutes Intravenous Every 8 hours 02/22/22 1853 02/23/22 1335  ? 02/22/22 1800  ceFEPIme (MAXIPIME) 2 g in sodium chloride 0.9 % 1

## 2022-03-08 DIAGNOSIS — I4819 Other persistent atrial fibrillation: Secondary | ICD-10-CM | POA: Diagnosis not present

## 2022-03-08 DIAGNOSIS — A419 Sepsis, unspecified organism: Secondary | ICD-10-CM | POA: Diagnosis not present

## 2022-03-08 DIAGNOSIS — R6521 Severe sepsis with septic shock: Secondary | ICD-10-CM | POA: Diagnosis not present

## 2022-03-08 DIAGNOSIS — I469 Cardiac arrest, cause unspecified: Secondary | ICD-10-CM | POA: Diagnosis not present

## 2022-03-08 LAB — GLUCOSE, CAPILLARY
Glucose-Capillary: 102 mg/dL — ABNORMAL HIGH (ref 70–99)
Glucose-Capillary: 112 mg/dL — ABNORMAL HIGH (ref 70–99)
Glucose-Capillary: 115 mg/dL — ABNORMAL HIGH (ref 70–99)
Glucose-Capillary: 117 mg/dL — ABNORMAL HIGH (ref 70–99)
Glucose-Capillary: 129 mg/dL — ABNORMAL HIGH (ref 70–99)
Glucose-Capillary: 92 mg/dL (ref 70–99)

## 2022-03-08 LAB — RENAL FUNCTION PANEL
Albumin: 2.2 g/dL — ABNORMAL LOW (ref 3.5–5.0)
Anion gap: 14 (ref 5–15)
BUN: 55 mg/dL — ABNORMAL HIGH (ref 6–20)
CO2: 22 mmol/L (ref 22–32)
Calcium: 8 mg/dL — ABNORMAL LOW (ref 8.9–10.3)
Chloride: 98 mmol/L (ref 98–111)
Creatinine, Ser: 6.49 mg/dL — ABNORMAL HIGH (ref 0.61–1.24)
GFR, Estimated: 9 mL/min — ABNORMAL LOW (ref >60)
Glucose, Bld: 108 mg/dL — ABNORMAL HIGH (ref 70–99)
Phosphorus: 8.5 mg/dL — ABNORMAL HIGH (ref 2.5–4.6)
Potassium: 4.2 mmol/L (ref 3.5–5.1)
Sodium: 134 mmol/L — ABNORMAL LOW (ref 135–145)

## 2022-03-08 LAB — MAGNESIUM: Magnesium: 2.5 mg/dL — ABNORMAL HIGH (ref 1.7–2.4)

## 2022-03-08 MED ORDER — HEPARIN SODIUM (PORCINE) 5000 UNIT/ML IJ SOLN
5000.0000 [IU] | Freq: Three times a day (TID) | INTRAMUSCULAR | Status: DC
  Administered 2022-03-10 – 2022-03-29 (×55): 5000 [IU] via SUBCUTANEOUS
  Filled 2022-03-08 (×53): qty 1

## 2022-03-08 MED ORDER — HEPARIN SODIUM (PORCINE) 5000 UNIT/ML IJ SOLN
5000.0000 [IU] | Freq: Three times a day (TID) | INTRAMUSCULAR | Status: AC
  Administered 2022-03-08 (×2): 5000 [IU] via SUBCUTANEOUS
  Filled 2022-03-08 (×2): qty 1

## 2022-03-08 MED ORDER — CEFAZOLIN SODIUM-DEXTROSE 2-4 GM/100ML-% IV SOLN
2.0000 g | INTRAVENOUS | Status: AC
  Filled 2022-03-08: qty 100

## 2022-03-08 MED ORDER — DRONABINOL 2.5 MG PO CAPS
2.5000 mg | ORAL_CAPSULE | Freq: Every day | ORAL | Status: DC
  Administered 2022-03-08 – 2022-03-11 (×4): 2.5 mg via ORAL
  Filled 2022-03-08 (×7): qty 1

## 2022-03-08 NOTE — Progress Notes (Signed)
?[facility] ?Progress Note  ? ? ?Assessment/ Plan:   ?AKI: in the setting of severe septic shock likely ATN.  Normal kidney function baseline ?            - started CRRT 02/26/22 ? - change to 2K bath 02/28/22 with K slightly higher, systemic hep gtt changed to bivalirudin ?            -Patient now off CRRT on 4/11 ? - tolerated dialysis without significant issues ? -Maintain TTS schedule for now ? -monitor for signs of recovery ? -Essentially no urine output at this time.  Will ask IR to consult for [facility] placement. Appreciate help ?  ?  ?2.  Septic shock ?            - severe initially w/ high pressors requirement now improved.  Has now completed antibiotics ?  ?3.  Scrotal Edema ?            - urology following.  Urology feels this is unlikely the source of his infection. Has improved. Continue w/ volume removal on HD ?  ?4.  Acute hypoxic RF ?            - Now extubated significantly improved.  Continue to monitor ? ?5.  Thrombocytopenia/ coagulopathy: Likely related cirrhosis and sepsis.  Now resolved ? ?6.  Anemia: Hemoglobin 7. 67mg of darbo given on 4/15. Transfusions if needed ? ? ?7.  Afib with RVR- hep--> bivalirudin gtt -> now holding anticoagulation ? ?8.  Foot wounds: associated with high-dose vasopressor requirements. Vascular surgery consulted ? ?9. Hyperphosphatemia: started sevelamer ?  ?10. Dispo: Floor status.  May need to start process of arranging outpatient hemodialysis.  Rehabilitation will also be a factor ? ?Subjective:   ? ?Patient more awake and alert today.  Having some pain in his scrotum.  Overall unchanged  ? ?Objective:   ?BP 124/73 (BP Location: Left Arm)   Pulse (!) 108   Temp 98.4 ?F (36.9 ?C) (Oral)   Resp 20   Ht _0  (1.778 m)   Wt (!) 140.6 kg   SpO2 100%   BMI 44.48 kg/m?  ? ?Intake/Output Summary (Last 24 hours) at 03/08/2022 0924 ?Last data filed at 03/08/2022 0500 ?Gross per 24 hour  ?Intake --  ?Output 2800 ml  ?Net -2800 ml  ? ?Weight change: -5.443  kg ? ?Physical Exam: ?GBZJ:IRCVE alert, no distress ?HEENT eyes opened, no nasal discharge ?PULM bilateral chest rise with no increased work of breathing ?CV Normal rate, no audible rub ?ABD distended, still soft, bowel sounds present ?GU: swollen scrotum but overall improved ?EXT LEE improved, mottled and cool bilateral soles of feet and toes ?NEURO Awake, alert, oriented to person and place ? ?Imaging: ?CT HEAD WO CONTRAST (5MM) ? ?Result Date: 03/07/2022 ?CLINICAL DATA:  Mental status change, unknown cause EXAM: CT HEAD WITHOUT CONTRAST TECHNIQUE: Contiguous axial images were obtained from the base of the skull through the vertex without intravenous contrast. RADIATION DOSE REDUCTION: This exam was performed according to the departmental dose-optimization program which includes automated exposure control, adjustment of the mA and/or kV according to patient size and/or use of iterative reconstruction technique. COMPARISON:  None. FINDINGS: Brain: No evidence of large-territorial acute infarction. No parenchymal hemorrhage. No mass lesion. No extra-axial collection. No mass effect or midline shift. No hydrocephalus. Basilar cisterns are patent. Vascular: No hyperdense vessel. Skull: No acute fracture or focal lesion. Sinuses/Orbits: Bilateral maxillary sinus mucosal thickening. Otherwise paranasal sinuses  and mastoid air cells are clear. The orbits are unremarkable. Other: None. IMPRESSION: No acute intracranial abnormality. Electronically Signed   By: Iven Finn M.D.   On: 03/07/2022 19:46   ? ?Labs: ?BMET ?Recent Labs  ?Lab 03/03/22 ?0326 03/03/22 ?1639 03/04/22 ?0316 03/05/22 ?0453 03/06/22 ?0530 03/07/22 ?0500 03/08/22 ?0530  ?NA 136 134* 135 135 133* 136 134*  ?K 3.5 3.4* 3.5 3.7 3.9 4.1 4.2  ?CL 100 99 101 101 98 100 98  ?CO2 _0 21* 22  ?GLUCOSE 100* 113* 116* 127* 123* 120* 108*  ?BUN 43* 47* 59* 59* 47* 75* 55*  ?CREATININE 2.19* 2.71* 3.73* 4.65* 4.68* 7.12* 6.49*  ?CALCIUM 8.3* 8.1* 8.1*  8.3* 8.2* 8.0* 8.0*  ?PHOS 4.0 4.8* 6.8*  6.6* 7.7* 7.3* 10.1* 8.5*  ? ?CBC ?Recent Labs  ?Lab 03/04/22 ?0316 03/05/22 ?0453 03/06/22 ?0530 03/07/22 ?0500  ?WBC 12.6* 13.5* 10.7* 10.2  ?NEUTROABS  --   --   --  7.8*  ?HGB 7.6* 7.5* 7.9* 7.0*  ?HCT 23.7* 24.3* 24.7* 22.2*  ?MCV 91.9 93.8 93.6 94.1  ?PLT 133* 158 178 183  ? ? ?Medications:   ? ? aspirin EC  81 mg Oral Daily  ? chlorhexidine gluconate (MEDLINE KIT)  15 mL Mouth Rinse BID  ? Chlorhexidine Gluconate Cloth  6 each Topical Q0600  ? darbepoetin (ARANESP) injection - DIALYSIS  60 mcg Intravenous Q Sat-HD  ? feeding supplement  237 mL Oral QID  ? folic acid  1 mg Oral Daily  ? guaiFENesin  600 mg Oral BID  ? heparin injection (subcutaneous)  5,000 Units Subcutaneous Q8H  ? insulin aspart  0-15 Units Subcutaneous TID WC  ? insulin aspart  0-5 Units Subcutaneous QHS  ? leptospermum manuka honey  1 application. Topical Daily  ? mouth rinse  15 mL Mouth Rinse BID  ? melatonin  3 mg Oral QHS  ? metoprolol tartrate  25 mg Oral TID  ? multivitamin  1 tablet Oral QHS  ? sevelamer carbonate  800 mg Oral TID WC  ? sodium chloride flush  10-40 mL Intracatheter Q12H  ? ? ? ?[name] ? ?03/08/2022, 9:24 AM   ?

## 2022-03-08 NOTE — Progress Notes (Signed)
In and out performed on patient ?150cc drained. Blue Green color ? ?Pt tolerated well ? ?MD notified ? ?Will continue to monitor ?

## 2022-03-08 NOTE — Progress Notes (Signed)
?  Progress Note ? ? ? ?03/08/2022 ?7:19 AM ?Hospital Day 13 ? ?Subjective:  denies any pain in his feet ? ?afebrile ? ?Vitals:  ? 03/08/22 0309 03/08/22 0312  ?BP: 116/67   ?Pulse: (!) 115   ?Resp: 20   ?Temp: 98.7 ?F (37.1 ?C)   ?SpO2: 95% 95%  ? ? ?Physical Exam: ?General:  no distress; resting comfortably ?Lungs:  non labored on room air ?Extremities:  bilateral feet continuing to demarcate ? ?CBC ?   ?Component Value Date/Time  ? WBC 10.2 03/07/2022 0500  ? RBC 2.36 (L) 03/07/2022 0500  ? HGB 7.0 (L) 03/07/2022 0500  ? HCT 22.2 (L) 03/07/2022 0500  ? PLT 183 03/07/2022 0500  ? MCV 94.1 03/07/2022 0500  ? MCV 92.8 04/10/2014 0900  ? MCH 29.7 03/07/2022 0500  ? MCHC 31.5 03/07/2022 0500  ? RDW 17.0 (H) 03/07/2022 0500  ? LYMPHSABS 0.8 03/07/2022 0500  ? MONOABS 0.4 03/07/2022 0500  ? EOSABS 0.2 03/07/2022 0500  ? BASOSABS 0.2 (H) 03/07/2022 0500  ? ? ?BMET ?   ?Component Value Date/Time  ? NA 134 (L) 03/08/2022 0530  ? K 4.2 03/08/2022 0530  ? CL 98 03/08/2022 0530  ? CO2 22 03/08/2022 0530  ? GLUCOSE 108 (H) 03/08/2022 0530  ? BUN 55 (H) 03/08/2022 0530  ? CREATININE 6.49 (H) 03/08/2022 0530  ? CREATININE 0.99 04/10/2014 0852  ? CALCIUM 8.0 (L) 03/08/2022 0530  ? GFRNONAA 9 (L) 03/08/2022 0530  ? GFRAA >60 09/14/2019 1808  ? ? ?INR ?   ?Component Value Date/Time  ? INR 1.3 (H) 02/26/2022 0851  ? ? ? ?Intake/Output Summary (Last 24 hours) at 03/08/2022 0719 ?Last data filed at 03/08/2022 0500 ?Gross per 24 hour  ?Intake --  ?Output 2800 ml  ?Net -2800 ml  ? ? ? ?Assessment/Plan:  61 y.o. male with bilateral lower extremity foot wounds Hospital Day 13 ? ?-bilateral feet continuing to demarcate.  Will continue to monitor.  Continue to keep feet clean and dry.  He is in pressure offloading boots.   ?-his repeat ABIs showed multiphasic flow in both feet with an ABI greater than 100.  Toe pressure could not be obtained on the right but on the left toe pressure was 78 mmHg which suggest adequate circulation for  healing. ? ? ?Leontine Locket, PA-C ?[facility] ?[phone] ?03/08/2022 ?7:19 AM ? ?

## 2022-03-08 NOTE — Progress Notes (Signed)
?PROGRESS NOTE ? ? ? ?Jeffrey Ayala  UYQ:[id number] DOB: 1961-03-04 [name]: 02/22/2022 ?PCP: Patient, No Pcp Per (Inactive)  ? ? ? ?Brief Narrative:  ? ?Jeffrey Ayala, is a 61 y.o. male, history of arthritis, bilateral knee replacement, gastric ulcer, kidney stones, eosinophilic esophagitis, migraines , does not take any meds regularly , in good health , started to feel unwell on 4/1, he presented to the [facility] ED with a chief complaint of nausea, vomiting, diarrhea and confusion. ?  ?Per  Elray Mcgregor (spouse), on 4/1 patient began complaining of feeling unwell. Wife denies sick contacts, questionable food.   Around 2 PM on 4/2 the patient developed nausea, vomiting, and diarrhea while at a birthday party and soiled himself. EMS was called. When EMS arrived he was found to be in A-fib with a rate of 120-160. ?  ?At the ED he was found to be febrile with a Tmax of 102.9.  A code sepsis was called , CT chest negative for acute process.  CT abdomen pelvis with and without contrast notable for swollen scrotum concerning for infection, colon filled with diarrhea.    hypotension requiring vasopressors, he was intubated shortly after admitted to ICU. ?  ?4/2 Presented to the ED. CT chest negative for acute process.  CT abdomen pelvis with and without contrast notable for swollen scrotum concerning for infection, colon filled with diarrhea.   ?4/3 Intubated overnight, brief cardiac arrest. Escalating pressors.   ?4/5 started on methylene blue ?4/6 started on CRRT ?4/11 extubated  ? ?Subjective: ? ?He remains confused, denies pain, he is on room air ?Son at bedside, he is concerned about his dad does not eat much ?Son reports patient does not appear to make any urine, he now agrees to try in and out cath , as bladder scan may not be reliable ? ?His abdomen is distended, wife states this is his baseline  ?Has rectal tube in with watery stool ? ?Assessment & Plan: ? Principal Problem: ?  Septic shock ([place]) ?Active Problems: ?   Pressure injury of skin ?  Encounter for central line placement ? ? ?Septic shock, thought due to scrotal cellulitis and possible aspiration pneumonia from n/v ?Acute hypoxic respiratory failure, required intubation, now extubated, on room air ?-Presented with nausea vomiting diarrhea developed at a birthday party, also has scrotal cellulitis on presentation ?-Blood culture no growth, urine culture with multiple species ?-He initially received Vanco cefepime and Flagyl, then zosyn, last dose on 4/14 ?-Extubated and off pressors, transferred to hospitalist service on 4/14 ? ?Acute metabolic encephalopathy ?Has been confused since admission ?Ct head no acute findings ? ammonia level unremarkable ?Remain confused, no agitation ? ?AKI from septic shock ?Required CRRT, now on iHD, IR to consult for [facility] placement ?Monitor urine output ?Management per nephrology ? ?New onset of afib, presents on admission ?No prior h/o Afib ?-echocardiogram with preserved lvef ?-remain in afib ?--Was on heparin then bival for afib, now off anticoagulation, on asa currently ?-cardiology consult for new onset of afib ? ? ? ?Bilateral foot lesions, possible from pressors ?Management per vascular surgery ? ?Scrotal cellulitis treated with abx ?Followed by urology ?No indication for surgical intervention ? ?Thrombocytopenia, likely from sepsis, resolved ?Anemia in the setting of acute illness, does not appear to have overt blood loss, monitor hgb, if need prbc transfusion, it is better to give during HD unless needs it urgently  ? ?Shock liver, resolving  ?CT ab did not show hepatobiliary abnormality ? ? ? ?  Incidental findings on CT scans: ?Coronary artery disease ?Splenomegaly, chronic since 2015 ?Bilateral nonobstructive nephrolithiasis ? ? ?Class III obesity: Body mass index is 44.48 kg/m?Marland KitchenMarland Kitchen ?Seen by dietician.  I agree with the assessment and plan as outlined below: ?Nutrition Status: ?Nutrition Problem: Inadequate oral intake ?Etiology:  inability to eat ?Signs/Symptoms: NPO status ?Interventions: Ensure Enlive (each supplement provides 350kcal and 20 grams of protein), MVI, Other (Comment) (double protein) ? ? ?Skin Assessment: ?I have examined the patient?s skin and I agree with the wound assessment as performed by the wound care RN as outlined below: ?Pressure Injury 02/22/22 Coccyx Medial Stage 2 -  Partial thickness loss of dermis presenting as a shallow open injury with a red, pink wound bed without slough. pink, painful (Active)  ?02/22/22 2330  ?Location: Coccyx  ?Location Orientation: Medial  ?Staging: Stage 2 -  Partial thickness loss of dermis presenting as a shallow open injury with a red, pink wound bed without slough.  ?Wound Description (Comments): pink, painful  ?Present on Admission: Yes  ?Dressing Type Foam - Lift dressing to assess site every shift 03/08/22 0743  ? ? ? ?I have Reviewed nursing notes, Vitals, pain scores, I/o's, Lab results and  imaging results since pt's last encounter, details please see discussion above  ?I ordered the following labs:  ?Unresulted Labs (From admission, onward)  ? ?  Start     Ordered  ? 03/09/22 0500  CBC  Every Mon-Wed-Fri (0500),   R     ?Question:  Specimen collection method  Answer:  Unit=Unit collect  ? 03/08/22 0818  ? 02/27/22 0500  Renal function panel (daily at 0500)  Daily,   R     ?Question:  Specimen collection method  Answer:  Unit=Unit collect  ? 02/26/22 1126  ? ?  ?  ? ?  ? ? ? ?DVT prophylaxis: heparin injection 5,000 Units Start: 03/10/22 0600 ?SCDs Start: 02/22/22 2249 ? ? ?Code Status:   Code Status: Full Code ? ?Family Communication: son at bedside  ?Disposition:  ? ?Status is: Inpatient ? ?Dispo: The patient is from: home ?             Anticipated d/c is to: TBD ?             Anticipated d/c date is: TBD ? ?Antimicrobials:   ?Anti-infectives (From admission, onward)  ? ? Start     Dose/Rate Route Frequency Ordered Stop  ? 03/09/22 0000  ceFAZolin (ANCEF) IVPB 2g/100 mL  premix       ? 2 g ?200 mL/hr over 30 Minutes Intravenous To Radiology 03/08/22 1113 03/10/22 0000  ? 03/03/22 2200  piperacillin-tazobactam (ZOSYN) IVPB 2.25 g       ? 2.25 g ?100 mL/hr over 30 Minutes Intravenous Every 8 hours 03/03/22 1607 03/06/22 2359  ? 02/26/22 1800  piperacillin-tazobactam (ZOSYN) IVPB 3.375 g  Status:  Discontinued       ? 3.375 g ?12.5 mL/hr over 240 Minutes Intravenous Every 8 hours 02/26/22 1343 02/26/22 1518  ? 02/26/22 1800  piperacillin-tazobactam (ZOSYN) IVPB 3.375 g  Status:  Discontinued       ? 3.375 g ?100 mL/hr over 30 Minutes Intravenous Every 6 hours 02/26/22 1518 03/03/22 1607  ? 02/25/22 1000  linezolid (ZYVOX) IVPB 600 mg  Status:  Discontinued       ? 600 mg ?300 mL/hr over 60 Minutes Intravenous Every 12 hours 02/25/22 0810 02/26/22 1443  ? 02/25/22 0915  piperacillin-tazobactam (ZOSYN) IVPB 2.25 g  Status:  Discontinued       ? 2.25 g ?100 mL/hr over 30 Minutes Intravenous Every 8 hours 02/25/22 0823 02/26/22 1343  ? 02/24/22 1400  vancomycin (VANCOCIN) IVPB 1000 mg/200 mL premix  Status:  Discontinued       ? 1,000 mg ?200 mL/hr over 60 Minutes Intravenous  Once 02/24/22 1312 02/24/22 1349  ? 02/24/22 0555  ceFEPIme (MAXIPIME) 2 g in sodium chloride 0.9 % 100 mL IVPB  Status:  Discontinued       ? 2 g ?200 mL/hr over 30 Minutes Intravenous Every 24 hours 02/23/22 1335 02/25/22 0823  ? 02/23/22 1343  vancomycin variable dose per unstable renal function (pharmacist dosing)  Status:  Discontinued       ?  Does not apply See admin instructions 02/23/22 1344 02/25/22 0823  ? 02/23/22 0800  vancomycin (VANCOREADY) IVPB 750 mg/150 mL  Status:  Discontinued       ? 750 mg ?150 mL/hr over 60 Minutes Intravenous Every 12 hours 02/22/22 1853 02/23/22 0731  ? 02/22/22 2200  ceFEPIme (MAXIPIME) 2 g in sodium chloride 0.9 % 100 mL IVPB  Status:  Discontinued       ? 2 g ?200 mL/hr over 30 Minutes Intravenous Every 8 hours 02/22/22 1853 02/23/22 1335  ? 02/22/22 1800  ceFEPIme  (MAXIPIME) 2 g in sodium chloride 0.9 % 100 mL IVPB       ? 2 g ?200 mL/hr over 30 Minutes Intravenous  Once 02/22/22 1750 02/22/22 1848  ? 02/22/22 1800  metroNIDAZOLE (FLAGYL) IVPB 500 mg       ? 500 mg ?100 mL/h

## 2022-03-08 NOTE — Progress Notes (Signed)
? ?Progress Note ? ?Patient Name: Jeffrey Ayala ?Date of Encounter: 03/08/2022 ? ?[facility] Cardiologist: Candee Furbish, MD  ? ?Subjective  ? ?Laying in bed with family at bedside. Intermittently conversant. States he is not having chest pain and breathing is okay.  ? ?Inpatient Medications  ?  ?Scheduled Meds: ? aspirin EC  81 mg Oral Daily  ? chlorhexidine gluconate (MEDLINE KIT)  15 mL Mouth Rinse BID  ? Chlorhexidine Gluconate Cloth  6 each Topical Q0600  ? darbepoetin (ARANESP) injection - DIALYSIS  60 mcg Intravenous Q Sat-HD  ? feeding supplement  237 mL Oral QID  ? folic acid  1 mg Oral Daily  ? guaiFENesin  600 mg Oral BID  ? heparin injection (subcutaneous)  5,000 Units Subcutaneous Q8H  ? insulin aspart  0-15 Units Subcutaneous TID WC  ? insulin aspart  0-5 Units Subcutaneous QHS  ? leptospermum manuka honey  1 application. Topical Daily  ? mouth rinse  15 mL Mouth Rinse BID  ? melatonin  3 mg Oral QHS  ? metoprolol tartrate  25 mg Oral TID  ? multivitamin  1 tablet Oral QHS  ? sevelamer carbonate  800 mg Oral TID WC  ? sodium chloride flush  10-40 mL Intracatheter Q12H  ? ?Continuous Infusions: ? sodium chloride Stopped (03/06/22 2355)  ? ?PRN Meds: ?acetaminophen, docusate sodium, metoprolol tartrate, oxyCODONE, phenol, polyethylene glycol, sodium chloride flush, white petrolatum  ? ?Vital Signs  ?  ?Vitals:  ? 03/07/22 2314 03/08/22 0309 03/08/22 0312 03/08/22 0500  ?BP: 110/62 116/67    ?Pulse: (!) 108 (!) 115    ?Resp: 20 20    ?Temp: 98.4 ?F (36.9 ?C) 98.7 ?F (37.1 ?C)    ?TempSrc: Oral Oral    ?SpO2: 94% 95% 95%   ?Weight:    (!) 140.6 kg  ?Height:      ? ? ?Intake/Output Summary (Last 24 hours) at 03/08/2022 1914 ?Last data filed at 03/08/2022 0500 ?Gross per 24 hour  ?Intake --  ?Output 2800 ml  ?Net -2800 ml  ? ? ?  03/08/2022  ?  5:00 AM 03/07/2022  ? 10:27 AM 03/07/2022  ?  6:44 AM  ?Last 3 Weights  ?Weight (lbs) 310 lb 265 lb 277 lb  ?Weight (kg) 140.615 kg 120.203 kg 125.646 kg  ?    ? ?Telemetry  ?  ?Afib with HR 100-120s - Personally Reviewed ? ?ECG  ?  ?No new tracing - Personally Reviewed ? ?Physical Exam  ? ?GEN: Ill appearing, laying in bed  ?Neck: JVD difficult to assess due to body habitus ?Cardiac: Irregular, no murmurs ?Respiratory: Diminished but clear ?GI: Obese, soft ?MS: Bilateral foot discoloration with demarcation line. Digits are blue/black from methylene blue. ?Neuro:  Confused, but answering questions intermittently ?Psych: Confused ? ?Labs  ?  ?High Sensitivity Troponin:   ?Recent Labs  ?Lab 02/22/22 ?1805 02/22/22 ?2007 02/23/22 ?7829 02/23/22 ?1208  ?TROPONINIHS 525* 1,834* 2,807* 3,550*  ?   ?Chemistry ?Recent Labs  ?Lab 03/02/22 ?0305 03/02/22 ?1556 03/05/22 ?0453 03/06/22 ?0530 03/07/22 ?0500  ?NA 135   < > 135 133* 136  ?K 3.8   < > 3.7 3.9 4.1  ?CL 100   < > 101 98 100  ?CO2 23   < > 22 24 21*  ?GLUCOSE 127*   < > 127* 123* 120*  ?BUN 53*   < > 59* 47* 75*  ?CREATININE 2.28*   < > 4.65* 4.68* 7.12*  ?CALCIUM 8.2*   < >  8.3* 8.2* 8.0*  ?MG 2.9*   < > 2.9* 2.6* 2.7*  ?PROT 6.1*  --   --   --  6.1*  ?ALBUMIN 2.2*   < > 2.2* 2.3* 2.2*  2.2*  ?AST 123*  --   --   --  46*  ?ALT 141*  --   --   --  78*  ?ALKPHOS 564*  --   --   --  266*  ?BILITOT 11.6*  --   --   --  3.4*  ?GFRNONAA 32*   < > 14* 14* 8*  ?ANIONGAP 12   < > '12 11 15  ' ? < > = values in this interval not displayed.  ?  ?Lipids No results for input(s): CHOL, TRIG, HDL, LABVLDL, LDLCALC, CHOLHDL in the last 168 hours.  ?Hematology ?Recent Labs  ?Lab 03/05/22 ?0453 03/06/22 ?0530 03/07/22 ?0500  ?WBC 13.5* 10.7* 10.2  ?RBC 2.59* 2.64* 2.36*  ?HGB 7.5* 7.9* 7.0*  ?HCT 24.3* 24.7* 22.2*  ?MCV 93.8 93.6 94.1  ?MCH 29.0 29.9 29.7  ?MCHC 30.9 32.0 31.5  ?RDW 16.9* 17.2* 17.0*  ?PLT 158 178 183  ? ?Thyroid No results for input(s): TSH, FREET4 in the last 168 hours.  ?BNPNo results for input(s): BNP, PROBNP in the last 168 hours.  ?DDimer No results for input(s): DDIMER in the last 168 hours.  ? ?Radiology  ?  ?CT HEAD  WO CONTRAST (5MM) ? ?Result Date: 03/07/2022 ?CLINICAL DATA:  Mental status change, unknown cause EXAM: CT HEAD WITHOUT CONTRAST TECHNIQUE: Contiguous axial images were obtained from the base of the skull through the vertex without intravenous contrast. RADIATION DOSE REDUCTION: This exam was performed according to the departmental dose-optimization program which includes automated exposure control, adjustment of the mA and/or kV according to patient size and/or use of iterative reconstruction technique. COMPARISON:  None. FINDINGS: Brain: No evidence of large-territorial acute infarction. No parenchymal hemorrhage. No mass lesion. No extra-axial collection. No mass effect or midline shift. No hydrocephalus. Basilar cisterns are patent. Vascular: No hyperdense vessel. Skull: No acute fracture or focal lesion. Sinuses/Orbits: Bilateral maxillary sinus mucosal thickening. Otherwise paranasal sinuses and mastoid air cells are clear. The orbits are unremarkable. Other: None. IMPRESSION: No acute intracranial abnormality. Electronically Signed   By: Iven Finn M.D.   On: 03/07/2022 19:46   ? ?Cardiac Studies  ? ?Echocardiogram 02/23/2022: ?Impressions:1. Left ventricular ejection fraction, by estimation, is 60 to 65%. The  ?left ventricle has normal function. The left ventricle has no regional  ?wall motion abnormalities. There is mild left ventricular hypertrophy.  ?Left ventricular diastolic function  ?could not be evaluated.  ? 2. Right ventricular systolic function is normal. The right ventricular  ?size is normal.  ? 3. The mitral valve is normal in structure. No evidence of mitral valve  ?regurgitation. No evidence of mitral stenosis.  ? 4. The aortic valve is tricuspid. Aortic valve regurgitation is not  ?visualized. No aortic stenosis is present.  ? 5. The inferior vena cava is dilated in size with <50% respiratory  ?variability, suggesting right atrial pressure of 15 mmHg.  ? ?Comparison(s): No prior  Echocardiogram. ?_______________ ?  ?ABIs/TBIs 02/24/2022: ?Summary:  ?Right: Resting right ankle-brachial index indicates severe right lower  ?extremity arterial disease.  ? ?Unable to obtain TBI due to low amplitude waveforms.  ?Left: Resting left ankle-brachial index indicates noncompressible left  ?lower extremity arteries.  ? ?Unable to obtain TBI due to low amplitude waveforms. ?_______________ ?  ?ABIs/TBIs 03/04/2022: ?  Summary: ?Right: Resting right ankle-brachial index indicates noncompressible right  ?lower extremity arteries.  ? ?Left: Resting left ankle-brachial index indicates noncompressible left  ?lower extremity arteries. The left toe-brachial index is abnormal. ? ?Patient Profile  ?   ?61 y.o. male with history of gastric ulcers, eosinophilic esphagitis, arthritis and migraines who presented with septic shock with concern for scrotal cellulitis and possible aspiration pneumonia with course complicated by acute respiratory failure, brief cardiac arrest post-intubation, AKI requiring CRRT>iHD, acute metabolic encephalopathy, bilateral foot wounds, and Afib with RVR for which Cardiology has been consulted.  ? ?Assessment & Plan  ?  ?#New Onset Atrial Fibrillation ?CHADs-vasc 0-1 (coronary Ca on CT). Developed in the setting of septic shock. Echo showed normal LV function. Not currently on AC as dropped HgB to 7 on heparin gtt and this was subsequently stopped. Also has history of gastric ulcers. Planning for rate control for now. Will not resume AC for now as also planned for tunneled HD catheter. Will plan to add apixaban prior to discharge as able. ?-Increase metop to 39m BID; can hold prior to HD if needed ?-No AC for now; hopefully can add apixaban prior to discharge as able ?-Possible plan for outpatient DCCV if remains in Afib and able to tolerate AC ?  ?#Elevated Troponin ?#Cardiac Arrest ?Patient had a brief cardiac arrest post-intubation on 4/3. Per note from 4/3, he became pulses a few minutes  after intubation and was resuscitated with 1 amp of epi and 1 amp of bicarb. High-sensitivity troponin elevated at 525 >> 1,834 >> 2,807 >> 3,550. Echo showed LVEF of 60-65% with normal wall motion. RV normal. Suspect

## 2022-03-08 NOTE — Consult Note (Signed)
? ?Chief Complaint: ?Patient was seen in consultation today for tunneled dialysis catheter placement ?Chief Complaint  ?Patient presents with  ? Atrial Fibrillation  ? Nausea  ? at the request of Dr Baruch Goldmann ? ? ?Supervising Physician: Daryll Brod ? ?Patient Status: [facility] - In-pt ? ?History of Present Illness: ?Jeffrey Ayala is a 61 y.o. male  ? ?Sepsis/shock ?Aspiration PNA ?Scrotal cellulitis ?To ED 02/26/22--- art line and central venous catheter placed by PCCM  in ED ?CRRT 02/26/22- 03/03/22 ? ?Nephrology: no signs of recovery ?Will need tunneled catheter and ongoing dialysis ? ?Request for same to IR ?Planned for 4/17 ? ?Past Medical History:  ?Diagnosis Date  ? Concussion   ? Eosinophilic esophagitis   ? Gastric ulcer   ? History of kidney stones   ? Migraine   ? ? ?Past Surgical History:  ?Procedure Laterality Date  ? CYSTOSCOPY    ? DENTAL SURGERY    ? FINGER SURGERY    ? HERNIA REPAIR    ? at age 85  ? KNEE ARTHROSCOPY Left   ? LITHOTRIPSY    ? NEPHROLITHOTOMY    ? TONSILLECTOMY    ? TOTAL KNEE ARTHROPLASTY Bilateral 11/04/2018  ? Procedure: BILATERAL TOTAL KNEE ARTHROPLASTY;  Surgeon: [name], MD;  Location: [place];  Service: Orthopedics;  Laterality: Bilateral;  spinal/epidural per anesthesiologist  ? ? ?Allergies: ?Codeine and Flomax [tamsulosin hcl] ? ?Medications: ?Prior to Admission medications   ?Medication Sig Start Date End Date Taking? Authorizing Provider  ?acetaminophen (TYLENOL) 650 MG CR tablet Take 650 mg by mouth every 8 (eight) hours as needed for pain or fever.   Yes [provider]  ?Calcium Carbonate-Vitamin D (OSCAL 500 D-3 PO) Take 1 tablet by mouth daily.   Yes [provider]  ?naproxen sodium (ALEVE) 220 MG tablet Take 220 mg by mouth daily as needed (pain.).   Yes [provider]  ?OVER THE COUNTER MEDICATION Take 1 capsule by mouth daily. Prostate vitamin   Yes [provider]  ?allopurinol (ZYLOPRIM) 100 MG tablet Take 1 tablet (100 mg  total) by mouth daily. ?Patient not taking: Reported on 02/22/2022 11/14/18   Rayburn, Neta Mends, PA-C  ?baclofen (LIORESAL) 10 MG tablet TAKE 1 TABLET BY MOUTH THREE TIMES A DAY AS NEEDED ?Patient not taking: Reported on 02/22/2022 09/02/20   Yetta Flock, MD  ?colchicine-probenecid 0.5-500 MG tablet Take 1 tablet by mouth 2 (two) times daily. ?Patient not taking: Reported on 02/22/2022 11/14/18   Rayburn, Neta Mends, PA-C  ?fluticasone (FLOVENT HFA) 220 MCG/ACT inhaler Inhale 2 puffs into the lungs in the morning and at bedtime. Do not eat or drink 30 minutes after inhalation ?Patient not taking: Reported on 02/22/2022 02/12/20   Yetta Flock, MD  ?pantoprazole (PROTONIX) 40 MG tablet TAKE 1 TABLET BY MOUTH TWICE A DAY ?Patient not taking: Reported on 02/22/2022 07/01/20   Armbruster, Carlota Raspberry, MD  ?  ? ?Family History  ?Problem Relation Age of Onset  ? COPD Mother   ? Sarcoidosis Brother   ? Cancer Brother   ? Stomach cancer Neg Hx   ? Pancreatic cancer Neg Hx   ? Esophageal cancer Neg Hx   ? Colon cancer Neg Hx   ? Rectal cancer Neg Hx   ? ? ?Social History  ? ?Socioeconomic History  ? Marital status: Married  ?  Spouse name: Not on file  ? Number of children: Not on file  ? Years of education: Not  on file  ? Highest education level: Not on file  ?Occupational History  ? Not on file  ?Tobacco Use  ? Smoking status: Never  ? Smokeless tobacco: Never  ?Vaping Use  ? Vaping Use: Never used  ?Substance and Sexual Activity  ? Alcohol use: Yes  ?  Comment: occassional  ? Drug use: No  ? Sexual activity: Not on file  ?Other Topics Concern  ? Not on file  ?Social History Narrative  ? Not on file  ? ?Social Determinants of Health  ? ?Financial Resource Strain: Not on file  ?Food Insecurity: Not on file  ?Transportation Needs: Not on file  ?Physical Activity: Not on file  ?Stress: Not on file  ?Social Connections: Not on file  ? ? ? ?Review of Systems: A 12 point ROS discussed and pertinent positives are  indicated in the HPI above.  All other systems are negative. ? ? ?Vital Signs: ?BP 124/73 (BP Location: Left Arm)   Pulse (!) 108   Temp 98.4 ?F (36.9 ?C) (Oral)   Resp 20   Ht '5\' 10"'$  (1.778 m)   Wt (!) 310 lb (140.6 kg)   SpO2 100%   BMI 44.48 kg/m?  ? ?Physical Exam ?Vitals reviewed.  ?HENT:  ?   Mouth/Throat:  ?   Mouth: Mucous membranes are moist.  ?Cardiovascular:  ?   Rate and Rhythm: Normal rate.  ?Pulmonary:  ?   Effort: Pulmonary effort is normal.  ?Abdominal:  ?   Palpations: Abdomen is soft.  ?Musculoskeletal:  ?   Comments: Although alert ?Not responding to me- son at bedside  ?Skin: ?   General: Skin is warm.  ?Neurological:  ?   Mental Status: He is alert.  ?   Comments: Does looks at me when I speak ?Does not follow commands  ?Psychiatric:  ?   Comments: Son at bedside-- signed consent  ? ? ?Imaging: ?CT ABDOMEN PELVIS WO CONTRAST ? ?Result Date: 02/28/2022 ?CLINICAL DATA:  Scrotal mass or lump EXAM: CT ABDOMEN AND PELVIS WITHOUT CONTRAST TECHNIQUE: Multidetector CT imaging of the abdomen and pelvis was performed following the standard protocol without IV contrast. RADIATION DOSE REDUCTION: This exam was performed according to the departmental dose-optimization program which includes automated exposure control, adjustment of the mA and/or kV according to patient size and/or use of iterative reconstruction technique. COMPARISON:  CT pelvis, 02/24/2022, CT abdomen pelvis, 12/25/2021 FINDINGS: Lower chest: Cardiomegaly. Left and right coronary artery calcifications. Dependent bibasilar atelectasis or consolidation. Hepatobiliary: No solid liver abnormality is seen. Contracted gallbladder. No gallstones, gallbladder wall thickening, or biliary dilatation. Pancreas: Unremarkable. No pancreatic ductal dilatation or surrounding inflammatory changes. Spleen: Splenomegaly, maximum coronal span 18.6 cm. Adrenals/Urinary Tract: Adrenal glands are unremarkable. Multiple small bilateral nonobstructive renal  calculi. Lobulated, scarred appearance of the kidneys, with multiple low-attenuation lesions, incompletely characterized by noncontrast examination although most likely small cysts. Foley catheter within the urinary bladder. Stomach/Bowel: Stomach is within normal limits. Appendix appears normal. No evidence of bowel wall thickening, distention, or inflammatory changes. Vascular/Lymphatic: Aortic atherosclerosis. No enlarged abdominal or pelvic lymph nodes. Reproductive: No mass. Severe scrotal edema and bilateral hydroceles, increased compared to prior examination. Other: Anasarca.  Small volume ascites in the low abdomen. Musculoskeletal: No acute or significant osseous findings. IMPRESSION: 1. Severe scrotal edema and bilateral hydroceles, increased compared to prior examination. 2. Ascites and anasarca. 3. Multiple small bilateral nonobstructive renal calculi. No hydronephrosis. 4. Splenomegaly. 5. Cardiomegaly and coronary artery disease. Aortic Atherosclerosis (ICD10-I70.0). Electronically Signed  By: Delanna Ahmadi M.D.   On: 02/28/2022 10:17  ? ?DG Abd 1 View ? ?Result Date: 02/23/2022 ?CLINICAL DATA:  NG tube placement EXAM: ABDOMEN - 1 VIEW COMPARISON:  02/23/2022 FINDINGS: Limited radiograph of the lower chest and upper abdomen was obtained for the purposes of enteric tube localization. Enteric tube is seen coursing below the diaphragm with distal tip and side port terminating within the expected location of the gastric body. IMPRESSION: Enteric tube appropriately positioned within the gastric body. Electronically Signed   By: Davina Poke D.O.   On: 02/23/2022 13:37  ? ?DG Abd 1 View ? ?Result Date: 02/23/2022 ?CLINICAL DATA:  Orogastric tube placement. EXAM: ABDOMEN - 1 VIEW COMPARISON:  08/14/2019. FINDINGS: Nasogastric tube terminates in the stomach with the side port at the gastroesophageal junction. IMPRESSION: Nasogastric terminates in the stomach with the side port at the gastroesophageal  junction. Advancing approximately 8-10 cm would better position the side port in the stomach. Electronically Signed   By: Lorin Picket M.D.   On: 02/23/2022 11:43  ? ?CT HEAD WO CONTRAST (5MM) ? ?Result Date: 4/15/

## 2022-03-08 NOTE — Progress Notes (Signed)
?  Subjective: ?Patient is resting comfortably in bed with no complaints.  Son at bedside ? ?Objective: ?Vital signs in last 24 hours: ?Temp:  [97.7 ?F (36.5 ?C)-98.7 ?F (37.1 ?C)] 98.4 ?F (36.9 ?C) (04/16 2336) ?Pulse Rate:  [100-115] 108 (04/16 0743) ?Resp:  [19-20] 20 (04/16 0743) ?BP: (109-124)/(62-73) 124/73 (04/16 1224) ?SpO2:  [92 %-100 %] 100 % (04/16 0743) ?Weight:  [140.6 kg] 140.6 kg (04/16 0500) ? ?Intake/Output from previous day: ?04/15 0701 - 04/16 0700 ?In: -  ?Out: 2800 [Stool:800] ? ?Intake/Output this shift: ?No intake/output data recorded. ? ?Physical Exam:  ?General: Alert but disoriented ?GU: Anterior portion of the scrotum no longer has greenish discoloration.  Skin is more flesh tone now.  There is a small measuring 2 x 2 cm eschar in the right upper portion of the anterior aspect of the scrotum.  The remainder of the scrotum exhibits no evidence of erythema, crepitus or signs of spreading cellulitis.  Penoscrotal edema markedly improved. ?Extremities: Digits on the right lower extremity are very firm and appear necrotic.  The middle digits on the left lower extremity are very firm and appeared necrotic as well. ? ?Lab Results: ?Recent Labs  ?  03/06/22 ?0530 03/07/22 ?0500  ?HGB 7.9* 7.0*  ?HCT 24.7* 22.2*  ? ?BMET ?Recent Labs  ?  03/07/22 ?0500 03/08/22 ?0530  ?NA 136 134*  ?K 4.1 4.2  ?CL 100 98  ?CO2 21* 22  ?GLUCOSE 120* 108*  ?BUN 75* 55*  ?CREATININE 7.12* 6.49*  ?CALCIUM 8.0* 8.0*  ? ? ? ?Studies/Results: ?CT HEAD WO CONTRAST (5MM) ? ?Result Date: 03/07/2022 ?CLINICAL DATA:  Mental status change, unknown cause EXAM: CT HEAD WITHOUT CONTRAST TECHNIQUE: Contiguous axial images were obtained from the base of the skull through the vertex without intravenous contrast. RADIATION DOSE REDUCTION: This exam was performed according to the departmental dose-optimization program which includes automated exposure control, adjustment of the mA and/or kV according to patient size and/or use of  iterative reconstruction technique. COMPARISON:  None. FINDINGS: Brain: No evidence of large-territorial acute infarction. No parenchymal hemorrhage. No mass lesion. No extra-axial collection. No mass effect or midline shift. No hydrocephalus. Basilar cisterns are patent. Vascular: No hyperdense vessel. Skull: No acute fracture or focal lesion. Sinuses/Orbits: Bilateral maxillary sinus mucosal thickening. Otherwise paranasal sinuses and mastoid air cells are clear. The orbits are unremarkable. Other: None. IMPRESSION: No acute intracranial abnormality. Electronically Signed   By: Iven Finn M.D.   On: 03/07/2022 19:46   ? ?Assessment/Plan: ?61 year old male with severe sepsis and diffuse scrotal edema ? ?-Scrotal edema is improving.  No signs of scrotal infection.  Zosyn per primary team.  We will continue to monitor small eschar involving the right upper portion of the scrotum.   ?-Okay for in/out cath if there is concern for urinary retention in order to accurately measure urine output. ?-Vascular surgery following lower extremity digit manifestation ? ? LOS: 14 days  ? ?Ellison Hughs, MD ?[facility] ?Pager: [phone] ? ?03/08/2022, 11:02 AM  ? ?

## 2022-03-09 ENCOUNTER — Inpatient Hospital Stay (HOSPITAL_COMMUNITY): Payer: BC Managed Care – PPO

## 2022-03-09 DIAGNOSIS — I4819 Other persistent atrial fibrillation: Secondary | ICD-10-CM | POA: Diagnosis not present

## 2022-03-09 DIAGNOSIS — A419 Sepsis, unspecified organism: Secondary | ICD-10-CM | POA: Diagnosis not present

## 2022-03-09 DIAGNOSIS — I96 Gangrene, not elsewhere classified: Secondary | ICD-10-CM | POA: Diagnosis not present

## 2022-03-09 DIAGNOSIS — I469 Cardiac arrest, cause unspecified: Secondary | ICD-10-CM | POA: Diagnosis not present

## 2022-03-09 DIAGNOSIS — R6521 Severe sepsis with septic shock: Secondary | ICD-10-CM | POA: Diagnosis not present

## 2022-03-09 HISTORY — PX: IR US GUIDE VASC ACCESS RIGHT: IMG2390

## 2022-03-09 HISTORY — PX: IR FLUORO GUIDE CV LINE RIGHT: IMG2283

## 2022-03-09 HISTORY — PX: IR PATIENT EVAL TECH 0-60 MINS: IMG5564

## 2022-03-09 LAB — RENAL FUNCTION PANEL
Albumin: 2.3 g/dL — ABNORMAL LOW (ref 3.5–5.0)
Anion gap: 18 — ABNORMAL HIGH (ref 5–15)
BUN: 79 mg/dL — ABNORMAL HIGH (ref 6–20)
CO2: 18 mmol/L — ABNORMAL LOW (ref 22–32)
Calcium: 8.3 mg/dL — ABNORMAL LOW (ref 8.9–10.3)
Chloride: 100 mmol/L (ref 98–111)
Creatinine, Ser: 9.04 mg/dL — ABNORMAL HIGH (ref 0.61–1.24)
GFR, Estimated: 6 mL/min — ABNORMAL LOW (ref >60)
Glucose, Bld: 110 mg/dL — ABNORMAL HIGH (ref 70–99)
Phosphorus: 11.4 mg/dL — ABNORMAL HIGH (ref 2.5–4.6)
Potassium: 4.4 mmol/L (ref 3.5–5.1)
Sodium: 136 mmol/L (ref 135–145)

## 2022-03-09 LAB — GLUCOSE, CAPILLARY
Glucose-Capillary: 109 mg/dL — ABNORMAL HIGH (ref 70–99)
Glucose-Capillary: 114 mg/dL — ABNORMAL HIGH (ref 70–99)
Glucose-Capillary: 113 mg/dL — ABNORMAL HIGH (ref 70–99)
Glucose-Capillary: 115 mg/dL — ABNORMAL HIGH (ref 70–99)
Glucose-Capillary: 115 mg/dL — ABNORMAL HIGH (ref 70–99)

## 2022-03-09 LAB — CBC
HCT: 22.3 % — ABNORMAL LOW (ref 39.0–52.0)
Hemoglobin: 7.1 g/dL — ABNORMAL LOW (ref 13.0–17.0)
MCH: 29.5 pg (ref 26.0–34.0)
MCHC: 31.8 g/dL (ref 30.0–36.0)
MCV: 92.5 fL (ref 80.0–100.0)
Platelets: 287 10*3/uL (ref 150–400)
RBC: 2.41 MIL/uL — ABNORMAL LOW (ref 4.22–5.81)
RDW: 16.6 % — ABNORMAL HIGH (ref 11.5–15.5)
WBC: 10.8 10*3/uL — ABNORMAL HIGH (ref 4.0–10.5)
nRBC: 0.2 % (ref 0.0–0.2)

## 2022-03-09 MED ORDER — METOPROLOL TARTRATE 25 MG PO TABS
37.5000 mg | ORAL_TABLET | Freq: Three times a day (TID) | ORAL | Status: DC
  Administered 2022-03-09 – 2022-03-14 (×16): 37.5 mg via ORAL
  Filled 2022-03-09 (×18): qty 1

## 2022-03-09 MED ORDER — MIDAZOLAM HCL 2 MG/2ML IJ SOLN
INTRAMUSCULAR | Status: AC | PRN
  Administered 2022-03-09: 1 mg via INTRAVENOUS

## 2022-03-09 MED ORDER — MIDAZOLAM HCL 2 MG/2ML IJ SOLN
INTRAMUSCULAR | Status: AC
  Filled 2022-03-09: qty 2

## 2022-03-09 MED ORDER — FENTANYL CITRATE (PF) 100 MCG/2ML IJ SOLN
INTRAMUSCULAR | Status: AC
  Filled 2022-03-09: qty 2

## 2022-03-09 MED ORDER — LIDOCAINE-EPINEPHRINE 1 %-1:100000 IJ SOLN
INTRAMUSCULAR | Status: AC
  Filled 2022-03-09: qty 1

## 2022-03-09 MED ORDER — PROSOURCE TF PO LIQD
45.0000 mL | Freq: Four times a day (QID) | ORAL | Status: DC
  Administered 2022-03-09 – 2022-03-23 (×35): 45 mL
  Filled 2022-03-09 (×38): qty 45

## 2022-03-09 MED ORDER — NEPRO/CARBSTEADY PO LIQD
1000.0000 mL | ORAL | Status: DC
  Administered 2022-03-09 – 2022-03-20 (×10): 1000 mL
  Filled 2022-03-09 (×13): qty 1000

## 2022-03-09 MED ORDER — HEPARIN SODIUM (PORCINE) 1000 UNIT/ML IJ SOLN
INTRAMUSCULAR | Status: AC
  Filled 2022-03-09: qty 10

## 2022-03-09 MED ORDER — FENTANYL CITRATE (PF) 100 MCG/2ML IJ SOLN
INTRAMUSCULAR | Status: AC | PRN
  Administered 2022-03-09 (×2): 25 ug via INTRAVENOUS

## 2022-03-09 MED ORDER — CEFAZOLIN SODIUM-DEXTROSE 2-4 GM/100ML-% IV SOLN
INTRAVENOUS | Status: AC
Start: 2022-03-09 — End: 2022-03-09
  Administered 2022-03-09: 2 g via INTRAVENOUS
  Filled 2022-03-09: qty 100

## 2022-03-09 MED ORDER — LIDOCAINE-EPINEPHRINE 1 %-1:100000 IJ SOLN
INTRAMUSCULAR | Status: AC | PRN
Start: 2022-03-09 — End: 2022-03-09
  Administered 2022-03-09: 20 mL

## 2022-03-09 NOTE — Progress Notes (Signed)
PT Cancellation Note ? ?Patient Details ?Name: Jeffrey Ayala ?MRN: [id number] ?DOB: 1961-08-30 ? ? ?Cancelled Treatment:    Reason Eval/Treat Not Completed: (P) Patient at procedure or test/unavailable (9:15 am- off unit then sleeping; PM -pt remains lethargic per SLP note, will defer tx until next date.) Will continue efforts per PT POC as schedule permits. ? ? ?Janet Decesare M Tyanne Derocher ?03/09/2022, 6:04 PM ? ? ?

## 2022-03-09 NOTE — Progress Notes (Addendum)
?PROGRESS NOTE ? ?Jeffrey Ayala  ?DOB: August 27, 1961  ?PCP: Patient, No Pcp Per (Inactive) ?OEU:[id number]  ?[name]: 02/22/2022 ? LOS: 15 days  ?Hospital Day: 16 ? ?Brief narrative: ?Jeffrey Ayala is a 61 y.o. male with PMH significant for arthritis, bilateral knee replacement, gastric ulcer, kidney stones, eosinophilic esophagitis, migraines not on any regular meds.   ?Patient presented to the ED on 4/2 with complaint of nausea, vomiting, diarrhea and confusion for 24 hours.   ?EMS noted him to be in A-fib with a heart rate of 120-160 bpm. ?In the ED, he had a fever of 102.9. ?CT abdomen pelvis showed swollen scrotum concerning for infection, colon filled with diarrhea.  ?He was hypotensive requiring multiple vasopressors in the ICU.   ?4/3, patient required intubation and also had a brief cardiac arrest postintubation.   ?4/5, in an effort to correct vasoplegia, patient was also started on [name] ?Because of septic shock, patient's renal function worsened. ?4/6, patient was started on CRRT ?4/11, patient was extubated. ?4/14, patient was transferred out to [facility] service. ? ?Subjective: ?Patient was seen and examined this morning.  Pleasant middle-aged Caucasian male. ?He underwent triple-lumen catheter by IR this morning.  Probably got sedated for the process.  At the time of my evaluation, patient was arousable to touch only.  Mumbled and fell back asleep.   ?Wife at bedside. ?Heart rate over 100 consistently, labs with hemoglobin 7.1 this morning. ?Per wife, patient continues to have diarrhea since admission.  He has a Flexi-Seal in place. ? ?Principal Problem: ?  Septic shock ([place]) ?Active Problems: ?  Pressure injury of skin ?  Encounter for central line placement ?  ?Assessment and Plan: ?Septic shock - POA ?-thought due to scrotal cellulitis and possible aspiration pneumonia from n/v ?-Blood culture did not show any growth.  Urine culture with multiple species. ?-He was seen by urologist.  No  intervention was recommended.  Initially treated with broad-spectrum antibiotic coverage.  To complete course of IV Ancef today. ?-While in ICU, patient required multiple vasopressors including Levophed, epinephrine and vasopressin.  Also given a trial of methylene blue. ?-Currently hemodynamically stable.  Off pressors. ?Recent Labs  ?Lab 03/04/22 ?0316 03/05/22 ?0453 03/06/22 ?0530 03/07/22 ?0500 03/09/22 ?1540  ?WBC 12.6* 13.5* 10.7* 10.2 10.8*  ? ?Acute metabolic encephalopathy ?-Has remained altered since admission.   ?-No evidence of intracranial infection. ?-4/15, CT head unremarkable. ?-Ammonia level unremarkable. ?-Unclear if patient had any hypoxic encephalopathy during septic shock requiring high-dose pressors. ?-Continue to monitor mental status change ?  ?AKI from septic shock ?-Started CRRT 4/64/11, now on iHD ?-4/17, underwent TLC catheter placed ?-Nephrology following.  No urine output ? ?Diarrhea ?-Per wife, patient continues to have diarrhea since admission.  Currently has a Flexi-Seal in place. ?-I will send GI pathogen panel. ?-Currently has Flexi-Seal in place draining liquid stool. ?-If no evidence of infection, will start on Imodium. ?   ?Dry gangrene of both feet ?-Secondary to vasospasm due to high-dose of vasopressors required for septic shock. ?-Vascular surgery following. ?-His wounds in both feet are continuing to demarcate.  Patient may ultimately require bilateral AKA  ? ?Acute anemia ?-Baseline hemoglobin normal.  Gradually downtrending hemoglobin level this hospitalization.  Currently remains between 7 and 8.  Patient did not require any PRBC transfusion this hospitalization. ?Recent Labs  ?  03/04/22 ?0316 03/05/22 ?0453 03/06/22 ?0530 03/07/22 ?0500 03/09/22 ?0867  ?HGB 7.6* 7.5* 7.9* 7.0* 7.1*  ?MCV 91.9 93.8 93.6 94.1 92.5  ? ?  New onset of afib, presents on admission ?-echocardiogram with preserved lvef ?-remains in rate controlled afib ?-Was on heparin drip initially but it was  stopped because of drop in hemoglobin. ?-Cardiology consulted.  Currently on metoprolol 37.5 mg 3 times daily and aspirin 81 mg daily. ?-If hemoglobin improves, will consider Eliquis at discharge. ?-Per cardiology, plan for outpatient DCCV if remains in A-fib and able to tolerate AC. ? ?Shock liver ?-Liver enzymes elevated likely due to shock.  Improving numbers.   ?-CT abd did not show hepatobiliary abnormality ?-Repeat LFT tomorrow. ?Recent Labs  ?Lab 03/05/22 ?0453 03/06/22 ?0530 03/07/22 ?0500 03/08/22 ?0530 03/09/22 ?8338  ?AST  --   --  46*  --   --   ?ALT  --   --  78*  --   --   ?ALKPHOS  --   --  20*  --   --   ?BILITOT  --   --  3.4*  --   --   ?PROT  --   --  6.1*  --   --   ?ALBUMIN 2.2* 2.3* 2.2*  2.2* 2.2* 2.3*  ? ? ?Acute hypoxic respiratory failure ?-Initially required intubation, later extubated.  Currently on room air. ? ?Elevated troponin ?-Likely because of brief cardiac arrest and septic shock. ?-Echo showed EF of 60 to 65% with normal wall motion. ?-No further ischemic evaluation at this time per cardiology. ? ?Morbid obesity  ?-Body mass index is 43.76 kg/m?Marland Kitchen Patient has been advised to make an attempt to improve diet and exercise patterns to aid in weight loss. ? ?Poor oral intake ?-Due to altered mentation.  Dietitian consult appreciated.  Agree with core track plan. ? ?Goals of care ?  Code Status: Full Code  ? ? ?Mobility: Encourage PT participation once mental status improves ? ?Skin assessment:  ?Pressure Injury 02/22/22 Coccyx Medial Stage 2 -  Partial thickness loss of dermis presenting as a shallow open injury with a red, pink wound bed without slough. pink, painful (Active)  ?02/22/22 2330  ?Location: Coccyx  ?Location Orientation: Medial  ?Staging: Stage 2 -  Partial thickness loss of dermis presenting as a shallow open injury with a red, pink wound bed without slough.  ?Wound Description (Comments): pink, painful  ?Present on Admission: Yes  ? ? ?Nutritional status:  ?Body mass  index is 43.76 kg/m?Marland Kitchen  ?Nutrition Problem: Inadequate oral intake ?Etiology: inability to eat ?Signs/Symptoms: NPO status ? ? ? ? ?Diet:  ?Diet Order   ? ?       ?  Diet renal with fluid restriction Fluid restriction: 1200 mL Fluid; Room service appropriate? Yes; Fluid consistency: Thin  Diet effective now       ?  ? ?  ?  ? ?  ? ? ?DVT prophylaxis:  ?heparin injection 5,000 Units Start: 03/10/22 0600 ?SCDs Start: 02/22/22 2249 ?  ?Antimicrobials: Completed course of IV Ancef today ?Fluid: None ?Consultants: Nephrology, cardiology ?Family Communication: Wife at bedside ? ?Status is: Inpatient ? ?Continue in-hospital care because: Continues to have multiple active medical issues ?Level of care: Progressive  ? ?Dispo: The patient is from: Home ?             Anticipated d/c is to: Likely SNF ?             Patient currently is not medically stable to d/c. ?  Difficult to place patient No ? ? ? ? ?Infusions:  ? sodium chloride Stopped (03/06/22 2355)  ? ? ?Scheduled  Meds: ? aspirin EC  81 mg Oral Daily  ? chlorhexidine gluconate (MEDLINE KIT)  15 mL Mouth Rinse BID  ? Chlorhexidine Gluconate Cloth  6 each Topical Q0600  ? darbepoetin (ARANESP) injection - DIALYSIS  60 mcg Intravenous Q Sat-HD  ? dronabinol  2.5 mg Oral QAC lunch  ? feeding supplement  237 mL Oral QID  ? folic acid  1 mg Oral Daily  ? guaiFENesin  600 mg Oral BID  ? [START ON 03/10/2022] heparin injection (subcutaneous)  5,000 Units Subcutaneous Q8H  ? heparin sodium (porcine)      ? insulin aspart  0-15 Units Subcutaneous TID WC  ? insulin aspart  0-5 Units Subcutaneous QHS  ? leptospermum manuka honey  1 application. Topical Daily  ? lidocaine-EPINEPHrine      ? mouth rinse  15 mL Mouth Rinse BID  ? melatonin  3 mg Oral QHS  ? metoprolol tartrate  37.5 mg Oral TID  ? multivitamin  1 tablet Oral QHS  ? sevelamer carbonate  800 mg Oral TID WC  ? sodium chloride flush  10-40 mL Intracatheter Q12H  ? ? ?PRN meds: ?acetaminophen, docusate sodium, metoprolol  tartrate, oxyCODONE, phenol, polyethylene glycol, sodium chloride flush, white petrolatum  ? ?Antimicrobials: ?Anti-infectives (From admission, onward)  ? ? Start     Dose/Rate Route Frequency Ordered Stop

## 2022-03-09 NOTE — Procedures (Signed)
Vascular and Interventional Radiology Procedure Note ? ?Patient: Jeffrey Ayala ?DOB: [birth year] ?Medical Record Number: [id number] ?Note Date/Time: 03/09/22 9:19 AM  ? ?Performing Physician: Michaelle Birks, MD ?Assistant(s): None ? ?Diagnosis: AKI requiring Hemodialysis ? ?Procedure: TUNNELED HEMODIALYSIS CATHETER PLACEMENT ? ?Anesthesia: Conscious Sedation ?Complications: None ?Estimated Blood Loss: Minimal ?Specimens:  None ? ?Findings:  ?Successful placement of right-sided, 23 cm (tip-to-cuff), tunneled hemodialysis catheter with the tip of the catheter in the proximal right atrium. ? ?Plan: Catheter ready for use. ? ?See detailed procedure note with images in PACS. ?The patient tolerated the procedure well without incident or complication and was returned to ICU in stable condition.  ? ? ?Michaelle Birks, MD ?[facility] ?[facility] ? ? ?Pager. [phone] ?Clinic. [phone]  ?

## 2022-03-09 NOTE — Evaluation (Signed)
Clinical/Bedside Swallow Evaluation ?Patient Details  ?Name: Jeffrey Ayala ?MRN: [id number] ?Date of Birth: 09-Mar-1961 ? ?Today's Date: 03/09/2022 ?Time: SLP Start Time (ACUTE ONLY): 1145 SLP Stop Time (ACUTE ONLY): 1200 ?SLP Time Calculation (min) (ACUTE ONLY): 15 min ? ?Past Medical History:  ?Past Medical History:  ?Diagnosis Date  ? Concussion   ? Eosinophilic esophagitis   ? Gastric ulcer   ? History of kidney stones   ? Migraine   ? ?Past Surgical History:  ?Past Surgical History:  ?Procedure Laterality Date  ? CYSTOSCOPY    ? DENTAL SURGERY    ? FINGER SURGERY    ? HERNIA REPAIR    ? at age 80  ? IR FLUORO GUIDE CV LINE RIGHT  03/09/2022  ? IR US GUIDE VASC ACCESS RIGHT  03/09/2022  ? KNEE ARTHROSCOPY Left   ? LITHOTRIPSY    ? NEPHROLITHOTOMY    ? TONSILLECTOMY    ? TOTAL KNEE ARTHROPLASTY Bilateral 11/04/2018  ? Procedure: BILATERAL TOTAL KNEE ARTHROPLASTY;  Surgeon: Nadara Mustard, MD;  Location: [facility] OR;  Service: Orthopedics;  Laterality: Bilateral;  spinal/epidural per anesthesiologist  ? ?HPI:  ?61 y.o. male presented to the [facility] ED with a chief complaint of nausea, vomiting, diarrhea. Dx septic shock (sources could be scrotal cellulitis versus right lower lobe aspiration pneumonia), afib, acute hypoxemic resp failure requiring intubation 4/3-4/10, AKI from septic shock, metabolic encephalopathy. Initial bedside swallow assessment was completed 4/10 after extubation - swallow function was normal despite confusion.  SLP was reconsulted 4/16 due to poor PO intake.  Pt's encephalopathy has persisted.  He had TLC cath placed 4/17; now on iHD. Has dry gangrene both feet secondary to vasospasm due to high-dose of vasopressors required for septic shock.  ?  ?Assessment / Plan / Recommendation  ?Clinical Impression ? Pt groggy today s/p procedure this am.  His wife is at bedside and reports very poor PO intake since extubation. Limited bedside assessment today revealed no change from prior assessment on 4/10 with  regard to airway protection.  Pt may benefit from supplemental feedings via NG temporarily until MS improves. D/W Jeffrey Ayala. Will defer to MD and dietitian.  Please continue to encourage oral intake to maintain health of oral mucosa and integrity of swallowing musculature.No SLP f/u needed. ?SLP Visit Diagnosis: Dysphagia, unspecified (R13.10) ?   ?Aspiration Risk ? No limitations  ?  ?Diet Recommendation   Regular solids/thin liquids in addition to TF ? ?Medication Administration: Whole meds with liquid  ?  ?Other  Recommendations Oral Care Recommendations: Oral care BID   ? ?Recommendations for follow up therapy are one component of a multi-disciplinary discharge planning process, led by the attending physician.  Recommendations may be updated based on patient status, additional functional criteria and insurance authorization. ? ?Follow up Recommendations No SLP follow up  ? ? ?  ? ? ?Swallow Study   ?General HPI: 61 y.o. male presented to the [facility] ED with a chief complaint of nausea, vomiting, diarrhea. Dx septic shock (sources could be scrotal cellulitis versus right lower lobe aspiration pneumonia), afib, acute hypoxemic resp failure requiring intubation 4/3-4/10, AKI from septic shock, metabolic encephalopathy. Initial bedside swallow assessment was completed 4/10 after extubation - swallow function was normal despite confusion.  SLP was reconsulted 4/16 due to poor PO intake.  Pt's encephalopathy has persisted.  He had TLC cath placed 4/17; no on iHD. Has dry gangrene both feet secondary to vasospasm due to high-dose of vasopressors required for septic shock. ?  Type of Study: Bedside Swallow Evaluation ?Previous Swallow Assessment: see HPI ?Diet Prior to this Study: Regular;Thin liquids ?Temperature Spikes Noted: No ?Respiratory Status: Room air ?History of Recent Intubation: Yes ?Length of Intubations (days): 7 days ?Date extubated: 03/02/22 ?Behavior/Cognition: Alert;Confused ?Oral Cavity Assessment: Within  Functional Limits ?Oral Cavity - Dentition: Adequate natural dentition ?Self-Feeding Abilities: Needs assist ?Patient Positioning: Upright in bed ?Baseline Vocal Quality: Normal ?Volitional Cough: Strong  ?  ?Oral/Motor/Sensory Function Overall Oral Motor/Sensory Function: Within functional limits   ?Ice Chips Ice chips: Within functional limits   ?Thin Liquid Thin Liquid: Within functional limits  ?  ?Nectar Thick Nectar Thick Liquid: Not tested   ?Honey Thick Honey Thick Liquid: Not tested   ?Puree Puree: Not tested   ?Solid ? ? ?  Solid: Not tested  ? ?  ? ?Jeffrey Ayala ?03/09/2022,2:02 PM ? ?Jeffrey Ayala L. Jeffrey Lesure, MA CCC/SLP ?Acute Rehabilitation Services ?Office number [phone] ?Pager [phone] ? ? ?

## 2022-03-09 NOTE — Progress Notes (Signed)
?[facility] ?Progress Note  ? ? ?Assessment/ Plan:   ?AKI: in the setting of severe septic shock likely ATN.  Normal kidney function baseline ?            - started CRRT 02/26/22 - 4/11 ? - tolerated dialysis without significant issues ? -Maintain TTS schedule for now; last HD 4/15 (sat) w/ 2L net UF. Check bladder scan ? -monitor for signs of recovery ? -Essentially no urine output at this time.  Appreciate VIR seeing the patient for TDC placement.  ?  ?  ?2.  Septic shock ?            - severe initially w/ high pressors requirement now improved.  Has now completed antibiotics ?  ?3.  Scrotal Edema ?            - urology following.  Urology feels this is unlikely the source of his infection. Has improved. Continue w/ volume removal on HD ?  ?4.  Acute hypoxic RF ?            - Now extubated significantly improved.  Continue to monitor ? ?5.  Thrombocytopenia/ coagulopathy: Likely related cirrhosis and sepsis.  Now resolved ? ?6.  Anemia: Hemoglobin 7. 60mcg of darbo given on 4/15. Transfusions if needed ? ? ?7.  Afib with RVR- hep--> bivalirudin gtt -> now holding anticoagulation ? ?8.  Foot wounds: associated with high-dose vasopressor requirements. Vascular surgery consulted ? ?9. Hyperphosphatemia: started on sevelamer ?  ?10. Dispo: Floor status.  May need to start process of arranging outpatient hemodialysis.  Rehabilitation will also be a factor ? ?Subjective:   ? ?Patient awake and alert; denies f/c/n/v/sob. Spouse bedside ?  ? ?Objective:   ?BP (!) 159/78 (BP Location: Left Arm)   Pulse (!) 101   Temp 98.7 ?F (37.1 ?C) (Oral)   Resp (!) 23   Ht 5' 10" (1.778 m)   Wt (!) 138.3 kg   SpO2 98%   BMI 43.76 kg/m?  ? ?Intake/Output Summary (Last 24 hours) at 03/09/2022 0750 ?Last data filed at 03/08/2022 1949 ?Gross per 24 hour  ?Intake --  ?Output 1150 ml  ?Net -1150 ml  ? ?Weight change: 18.1 kg ? ?Physical Exam: ?GEN:Awake, alert, no distress ?HEENT eyes opened, no nasal discharge ?PULM  bilateral chest rise with no increased work of breathing ?CV Normal rate, no audible rub ?ABD distended, soft, bowel sounds present ?GU: swollen scrotum but overall improved ?EXT LEE improved, mottled and cool bilateral soles of feet and toes, dry gangrene b/l worse on the right side ?NEURO Awake, alert, oriented to person and place ? ?Imaging: ?CT HEAD WO CONTRAST (5MM) ? ?Result Date: 03/07/2022 ?CLINICAL DATA:  Mental status change, unknown cause EXAM: CT HEAD WITHOUT CONTRAST TECHNIQUE: Contiguous axial images were obtained from the base of the skull through the vertex without intravenous contrast. RADIATION DOSE REDUCTION: This exam was performed according to the departmental dose-optimization program which includes automated exposure control, adjustment of the mA and/or kV according to patient size and/or use of iterative reconstruction technique. COMPARISON:  None. FINDINGS: Brain: No evidence of large-territorial acute infarction. No parenchymal hemorrhage. No mass lesion. No extra-axial collection. No mass effect or midline shift. No hydrocephalus. Basilar cisterns are patent. Vascular: No hyperdense vessel. Skull: No acute fracture or focal lesion. Sinuses/Orbits: Bilateral maxillary sinus mucosal thickening. Otherwise paranasal sinuses and mastoid air cells are clear. The orbits are unremarkable. Other: None. IMPRESSION: No acute intracranial abnormality. Electronically Signed     By: Iven Finn M.D.   On: 03/07/2022 19:46   ? ?Labs: ?BMET ?Recent Labs  ?Lab 03/03/22 ?1639 03/04/22 ?0316 03/05/22 ?0453 03/06/22 ?0530 03/07/22 ?0500 03/08/22 ?0530 03/09/22 ?4580  ?NA 134* 135 135 133* 136 134* 136  ?K 3.4* 3.5 3.7 3.9 4.1 4.2 4.4  ?CL 99 101 101 98 100 98 100  ?CO2 _0 21* 22 18*  ?GLUCOSE 113* 116* 127* 123* 120* 108* 110*  ?BUN 47* 59* 59* 47* 75* 55* 79*  ?CREATININE 2.71* 3.73* 4.65* 4.68* 7.12* 6.49* 9.04*  ?CALCIUM 8.1* 8.1* 8.3* 8.2* 8.0* 8.0* 8.3*  ?PHOS 4.8* 6.8*  6.6* 7.7* 7.3* 10.1*  8.5* 11.4*  ? ?CBC ?Recent Labs  ?Lab 03/05/22 ?0453 03/06/22 ?0530 03/07/22 ?0500 03/09/22 ?9983  ?WBC 13.5* 10.7* 10.2 10.8*  ?NEUTROABS  --   --  7.8*  --   ?HGB 7.5* 7.9* 7.0* 7.1*  ?HCT 24.3* 24.7* 22.2* 22.3*  ?MCV 93.8 93.6 94.1 92.5  ?PLT 158 178 183 287  ? ? ?Medications:   ? ? aspirin EC  81 mg Oral Daily  ? chlorhexidine gluconate (MEDLINE KIT)  15 mL Mouth Rinse BID  ? Chlorhexidine Gluconate Cloth  6 each Topical Q0600  ? darbepoetin (ARANESP) injection - DIALYSIS  60 mcg Intravenous Q Sat-HD  ? dronabinol  2.5 mg Oral QAC lunch  ? feeding supplement  237 mL Oral QID  ? folic acid  1 mg Oral Daily  ? guaiFENesin  600 mg Oral BID  ? [START ON 03/10/2022] heparin injection (subcutaneous)  5,000 Units Subcutaneous Q8H  ? insulin aspart  0-15 Units Subcutaneous TID WC  ? insulin aspart  0-5 Units Subcutaneous QHS  ? leptospermum manuka honey  1 application. Topical Daily  ? mouth rinse  15 mL Mouth Rinse BID  ? melatonin  3 mg Oral QHS  ? metoprolol tartrate  25 mg Oral TID  ? multivitamin  1 tablet Oral QHS  ? sevelamer carbonate  800 mg Oral TID WC  ? sodium chloride flush  10-40 mL Intracatheter Q12H  ? ? ? ?Madison Direnzo W ? ?03/09/2022, 7:50 AM   ?

## 2022-03-09 NOTE — Procedures (Signed)
Cortrak  Person Inserting Tube:  Yailene Badia D, RD Tube Type:  Cortrak - 43 inches Tube Size:  10 Tube Location:  Left nare Secured by: Bridle Technique Used to Measure Tube Placement:  Marking at nare/corner of mouth Cortrak Secured At:  69 cm  Cortrak Tube Team Note:  Consult received to place a Cortrak feeding tube.   X-ray is required, abdominal x-ray has been ordered by the Cortrak team. Please confirm tube placement before using the Cortrak tube.   If the tube becomes dislodged please keep the tube and contact the Cortrak team at [email or web address] (password [email or web address]) for replacement.  If after hours and replacement cannot be delayed, place a NG tube and confirm placement with an abdominal x-ray.    Tarun Patchell, RD, LDN Clinical Dietitian RD pager # available in AMION  After hours/weekend pager # available in AMION  

## 2022-03-09 NOTE — Progress Notes (Addendum)
Nutrition Follow-up ? ?DOCUMENTATION CODES:  ? ?Non-severe (moderate) malnutrition in context of acute illness/injury ? ?INTERVENTION:  ? ?Place Cortrak tube and begin supplemental enteral nutrition: ?Nepro at 50 ml/h (1200 ml/day). ?Prosource TF 45 ml QID. ?Provides 2320 kcal, 141 gm protein, 872 ml free water daily. ? ?Calorie count x 48 hours. ? ?Continue to offer Ensure Enlive po QID, each supplement provides 350 kcal and 20 grams of protein. ? ?Liberalize diet to regular.  ? ?Continue Rena-vit daily. ? ?NUTRITION DIAGNOSIS:  ? ?Moderate Malnutrition related to acute illness (septic shock, A fib, AKI) as evidenced by mild fat depletion, mild muscle depletion, moderate muscle depletion. ? ?Ongoing  ? ?GOAL:  ? ?Patient will meet greater than or equal to 90% of their needs ? ?Unmet  ? ?MONITOR:  ? ?PO intake, Supplement acceptance, Labs, Skin ? ?REASON FOR ASSESSMENT:  ? ?Consult ?Calorie Count ? ?ASSESSMENT:  ? ?61 year old male who presented to the ED on 4/02 with N/V/D. PMH of HTN, PUD, eosinophilic esophagitis, kidney stones. Pt admitted with septic shock, new onset atrial fibrillation, AKI. ? ?S/P tunneled HD catheter placement earlier today. ?Last HD 4/15.  ?UOP 150 ml x 24 hours. ?1,000 ml stool output x 24 hours. ? ?Received consult for calorie count; envelope not on door yet. ? ?Spoke with patient and his wife at bedside. Wife reports that patient has been more alert and talkative over the past hour today than he has been his whole admission. Patient has been eating minimally for the past few days. He was drinking Ensure supplements QID, but over the past few days, he has been taking a few sips and refusing to drink anymore or eat much. On dialysis days, he eats nothing. Wife is concerned that patient is not eating enough. Discussed Cortrak placement and supplemental tube feeding and wife agrees.   ? ?S/P follow-up with SLP today. [name]. He is currently on  a renal diet. RD to liberalize diet to regular to offer more food options to help improve intake.  ? ?Patient with dry gangrene of bilateral feet, likely related to vasopressor requirements earlier in admission; may require bilateral amputations.  ? ?Discussed with patient and his wife recommendations for Cortrak tube placement for supplemental tube feeding. Patient does not seem to understand questions that RD asked him.  ? ?Labs reviewed. Phos 11.4 (Renvela started 4/15) ?CBG: 109-114 ? ?Medications reviewed and include Aranesp, Marinol, folic acid, Novolog, Rena-vit, Renvela. ? ?Patient now meets criteria for moderate malnutrition with mild depletion of muscle and subcutaneous fat mass.  ? ?NUTRITION - FOCUSED PHYSICAL EXAM: ? ?Flowsheet Row Most Recent Value  ?Orbital Region Mild depletion  ?Upper Arm Region Mild depletion  ?Thoracic and Lumbar Region Mild depletion  ?Buccal Region Mild depletion  ?Temple Region Mild depletion  ?Clavicle Bone Region Mild depletion  ?Clavicle and Acromion Bone Region No depletion  ?Scapular Bone Region No depletion  ?Dorsal Hand No depletion  ?Patellar Region Moderate depletion  ?Anterior Thigh Region Moderate depletion  ?Posterior Calf Region Moderate depletion  ?Edema (RD Assessment) Moderate  ?Hair Reviewed  ?Eyes Reviewed  ?Mouth Reviewed  ?Skin Reviewed  ?Nails Reviewed  ? ?  ? ? ?Diet Order:   ?Diet Order   ? ?       ?  Diet renal with fluid restriction Fluid restriction: 1200 mL Fluid; Room service appropriate? Yes; Fluid consistency: Thin  Diet effective now       ?  ? ?  ?  ? ?  ? ? ?  EDUCATION NEEDS:  ? ?No education needs have been identified at this time ? ?Skin:  Skin Assessment: Skin Integrity Issues: ?Skin Integrity Issues:: Other (Comment), Stage II ?Stage II: coccyx ?Other: skin tear L thigh; bilateral feet and toes are necrotic ? ?Last BM:  4/16 type 7 ? ?Height:  ? ?Ht Readings from Last 1 Encounters:  ?02/26/22 '5\' 10"'$  (1.778 m)  ? ? ?Weight:  ? ?Wt Readings  from Last 1 Encounters:  ?03/09/22 (!) 138.3 kg  ? ? ?Ideal Body Weight:  75.5 kg ? ?BMI:  Body mass index is 43.76 kg/m?. ? ?Estimated Nutritional Needs:  ? ?Kcal:  2300-2500 ? ?Protein:  150-170 grams ? ?Fluid:  >/= 2.0 L ? ? ? ?Jeffrey Ayala RD, LDN, CNSC ?Please refer to Amion for contact information.                                                       ? ?

## 2022-03-09 NOTE — Progress Notes (Addendum)
? ?Progress Note ? ?Patient Name: Jeffrey Ayala ?Date of Encounter: 03/09/2022 ? ?[facility] Cardiologist: Candee Furbish, MD  ? ?Subjective  ? ?S/p tunneled dialysis catheter placement this morning. No chest pain.  ? ?Inpatient Medications  ?  ?Scheduled Meds: ? aspirin EC  81 mg Oral Daily  ? chlorhexidine gluconate (MEDLINE KIT)  15 mL Mouth Rinse BID  ? Chlorhexidine Gluconate Cloth  6 each Topical Q0600  ? darbepoetin (ARANESP) injection - DIALYSIS  60 mcg Intravenous Q Sat-HD  ? dronabinol  2.5 mg Oral QAC lunch  ? feeding supplement  237 mL Oral QID  ? folic acid  1 mg Oral Daily  ? guaiFENesin  600 mg Oral BID  ? [START ON 03/10/2022] heparin injection (subcutaneous)  5,000 Units Subcutaneous Q8H  ? heparin sodium (porcine)      ? insulin aspart  0-15 Units Subcutaneous TID WC  ? insulin aspart  0-5 Units Subcutaneous QHS  ? leptospermum manuka honey  1 application. Topical Daily  ? lidocaine-EPINEPHrine      ? mouth rinse  15 mL Mouth Rinse BID  ? melatonin  3 mg Oral QHS  ? metoprolol tartrate  25 mg Oral TID  ? multivitamin  1 tablet Oral QHS  ? sevelamer carbonate  800 mg Oral TID WC  ? sodium chloride flush  10-40 mL Intracatheter Q12H  ? ?Continuous Infusions: ? sodium chloride Stopped (03/06/22 2355)  ? ?PRN Meds: ?acetaminophen, docusate sodium, fentaNYL, lidocaine-EPINEPHrine, metoprolol tartrate, midazolam, oxyCODONE, phenol, polyethylene glycol, sodium chloride flush, white petrolatum  ? ?Vital Signs  ?  ?Vitals:  ? 03/09/22 0845 03/09/22 0850 03/09/22 0855 03/09/22 0900  ?BP: 108/69 (!) 146/65 126/73 122/79  ?Pulse: 97 (!) 108 (!) 107 (!) 109  ?Resp: (!) 26 (!) 22 (!) 25 (!) 23  ?Temp:      ?TempSrc:      ?SpO2: 100% 96% 99% 100%  ?Weight:      ?Height:      ? ? ?Intake/Output Summary (Last 24 hours) at 03/09/2022 0903 ?Last data filed at 03/08/2022 1949 ?Gross per 24 hour  ?Intake --  ?Output 1150 ml  ?Net -1150 ml  ? ? ?  03/09/2022  ?  5:09 AM 03/08/2022  ?  5:00 AM 03/07/2022  ? 10:27 AM  ?Last  3 Weights  ?Weight (lbs) 305 lb 310 lb 265 lb  ?Weight (kg) 138.347 kg 140.615 kg 120.203 kg  ?   ? ?Telemetry  ?  ?aFib in 100s - Personally Reviewed ? ?ECG  ?  ?N/A ? ?Physical Exam  ? ?GEN: Ill appearing male in no acute distress.   ?Neck: JVD difficult to assess  ?Cardiac: Ir IR tachycardic, no murmurs, rubs, or gallops.  ?Respiratory: Diminished  ?GI: Soft, nontender, non-distended  ?MS: foot discoloration. Digits are blue/black ?Neuro:  confused  ?Psych: confused  ? ?Labs  ?  ?High Sensitivity Troponin:   ?Recent Labs  ?Lab 02/22/22 ?1805 02/22/22 ?2007 02/23/22 ?1696 02/23/22 ?1208  ?TROPONINIHS 525* 1,834* 2,807* 3,550*  ?   ?Chemistry ?Recent Labs  ?Lab 03/06/22 ?0530 03/07/22 ?0500 03/08/22 ?0530 03/09/22 ?7893  ?NA 133* 136 134* 136  ?K 3.9 4.1 4.2 4.4  ?CL 98 100 98 100  ?CO2 24 21* 22 18*  ?GLUCOSE 123* 120* 108* 110*  ?BUN 47* 75* 55* 79*  ?CREATININE 4.68* 7.12* 6.49* 9.04*  ?CALCIUM 8.2* 8.0* 8.0* 8.3*  ?MG 2.6* 2.7* 2.5*  --   ?PROT  --  6.1*  --   --   ?  ALBUMIN 2.3* 2.2*  2.2* 2.2* 2.3*  ?AST  --  46*  --   --   ?ALT  --  78*  --   --   ?ALKPHOS  --  266*  --   --   ?BILITOT  --  3.4*  --   --   ?GFRNONAA 14* 8* 9* 6*  ?ANIONGAP '11 15 14 ' 18*  ?  ?Lipids No results for input(s): CHOL, TRIG, HDL, LABVLDL, LDLCALC, CHOLHDL in the last 168 hours.  ?Hematology ?Recent Labs  ?Lab 03/06/22 ?0530 03/07/22 ?0500 03/09/22 ?6333  ?WBC 10.7* 10.2 10.8*  ?RBC 2.64* 2.36* 2.41*  ?HGB 7.9* 7.0* 7.1*  ?HCT 24.7* 22.2* 22.3*  ?MCV 93.6 94.1 92.5  ?MCH 29.9 29.7 29.5  ?MCHC 32.0 31.5 31.8  ?RDW 17.2* 17.0* 16.6*  ?PLT 178 183 287  ? ?Thyroid No results for input(s): TSH, FREET4 in the last 168 hours.  ?BNPNo results for input(s): BNP, PROBNP in the last 168 hours.  ?DDimer No results for input(s): DDIMER in the last 168 hours.  ? ?Radiology  ?  ?CT HEAD WO CONTRAST (5MM) ? ?Result Date: 03/07/2022 ?CLINICAL DATA:  Mental status change, unknown cause EXAM: CT HEAD WITHOUT CONTRAST TECHNIQUE: Contiguous axial images  were obtained from the base of the skull through the vertex without intravenous contrast. RADIATION DOSE REDUCTION: This exam was performed according to the departmental dose-optimization program which includes automated exposure control, adjustment of the mA and/or kV according to patient size and/or use of iterative reconstruction technique. COMPARISON:  None. FINDINGS: Brain: No evidence of large-territorial acute infarction. No parenchymal hemorrhage. No mass lesion. No extra-axial collection. No mass effect or midline shift. No hydrocephalus. Basilar cisterns are patent. Vascular: No hyperdense vessel. Skull: No acute fracture or focal lesion. Sinuses/Orbits: Bilateral maxillary sinus mucosal thickening. Otherwise paranasal sinuses and mastoid air cells are clear. The orbits are unremarkable. Other: None. IMPRESSION: No acute intracranial abnormality. Electronically Signed   By: Iven Finn M.D.   On: 03/07/2022 19:46   ? ?Cardiac Studies  ? ?Echocardiogram 02/23/2022: ?Impressions:1. Left ventricular ejection fraction, by estimation, is 60 to 65%. The  ?left ventricle has normal function. The left ventricle has no regional  ?wall motion abnormalities. There is mild left ventricular hypertrophy.  ?Left ventricular diastolic function  ?could not be evaluated.  ? 2. Right ventricular systolic function is normal. The right ventricular  ?size is normal.  ? 3. The mitral valve is normal in structure. No evidence of mitral valve  ?regurgitation. No evidence of mitral stenosis.  ? 4. The aortic valve is tricuspid. Aortic valve regurgitation is not  ?visualized. No aortic stenosis is present.  ? 5. The inferior vena cava is dilated in size with <50% respiratory  ?variability, suggesting right atrial pressure of 15 mmHg.  ? ?Comparison(s): No prior Echocardiogram. ?_______________ ?  ?ABIs/TBIs 02/24/2022: ?Summary:  ?Right: Resting right ankle-brachial index indicates severe right lower  ?extremity arterial disease.   ? ?Unable to obtain TBI due to low amplitude waveforms.  ?Left: Resting left ankle-brachial index indicates noncompressible left  ?lower extremity arteries.  ? ?Unable to obtain TBI due to low amplitude waveforms. ?_______________ ?  ?ABIs/TBIs 03/04/2022: ?Summary: ?Right: Resting right ankle-brachial index indicates noncompressible right  ?lower extremity arteries.  ? ?Left: Resting left ankle-brachial index indicates noncompressible left  ?lower extremity arteries. The left toe-brachial index is abnormal. ?  ? ?Patient Profile  ?   ?61 y.o. male with history of gastric ulcers, eosinophilic esphagitis, arthritis and  migraines who presented with septic shock with concern for scrotal cellulitis and possible aspiration pneumonia with course complicated by acute respiratory failure, brief cardiac arrest post-intubation, AKI requiring CRRT>iHD, acute metabolic encephalopathy, bilateral foot wounds, and Afib with RVR for which Cardiology has been consulted.  ? ?Assessment & Plan  ?  ?New Onset Atrial Fibrillation ?- CHADs-vasc 0-1 (coronary Ca on CT).AFib in the setting of septic shock. Echo showed normal LV function. Initially placed on heparin gtt but this was subsequently stopped due to drop in Hgb to 7. Also has history of gastric ulcers.  ?- Plan for rate control. HR relatively stable on metoprolol 25 mg TID ?- Could start Eliquis at discharge if able  ?-Possible plan for outpatient DCCV if remains in Afib and able to tolerate AC ?  ?2. Elevated Troponin ?3. Cardiac Arrest ?Patient had a brief cardiac arrest post-intubation on 4/3. Per note from 4/3, he became pulses a few minutes after intubation and was resuscitated with 1 amp of epi and 1 amp of bicarb. High-sensitivity troponin elevated at 525 >> 1,834 >> 2,807 >> 3,550. Echo showed LVEF of 60-65% with normal wall motion. RV normal. Suspect demand in the setting of severe sepsis and respiratory arrest.  ?-No plans for ischemic testing at this time ? ?Otherwise  per primary team  ? ?For questions or updates, please contact [place] ?Please consult [email or web address] for contact info under  ? ?  ?   ?Signed, ?Leanor Kail, PA  ?03/09/2022, 9:03 AM    ? ?[facility] Din

## 2022-03-09 NOTE — Progress Notes (Signed)
? ?  VASCULAR SURGERY ASSESSMENT & PLAN:  ? ?DRY GANGRENE OF BOTH FEET: His wounds on the feet are both continuing to demarcate.  Currently he would not be a candidate for a transmetatarsal amputation on either side.  His only option would be bilateral .  As long as the wounds are not making him septic there is some advantage to waiting to see how much tissue is salvageable.  Honestly however, I think he will likely require bilateral amputations.  Given that it would be extremely unlikely that he would walk with bilateral below the knee amputations he would best be served by bilateral above-the-knee amputations. ? ?I updated the patient's wife. ? ?SUBJECTIVE:  ? ?Still confused. ? ?PHYSICAL EXAM:  ? ?Vitals:  ? 03/09/22 0845 03/09/22 0850 03/09/22 0855 03/09/22 0900  ?BP: 108/69 (!) 146/65 126/73 122/79  ?Pulse: 97 (!) 108 (!) 107 (!) 109  ?Resp: (!) 26 (!) 22 (!) 25 (!) 23  ?Temp:      ?TempSrc:      ?SpO2: 100% 96% 99% 100%  ?Weight:      ?Height:      ? ?The wounds on both feet continue to demarcate as documented in the photographs below. ?I open the blister on the plantar aspect of the left foot as I was concerned this would become infected. ? ? ? ? ? ? ? ? ? ? ? ? ?LABS:  ? ?Lab Results  ?Component Value Date  ? WBC 10.8 (H) 03/09/2022  ? HGB 7.1 (L) 03/09/2022  ? HCT 22.3 (L) 03/09/2022  ? MCV 92.5 03/09/2022  ? PLT 287 03/09/2022  ? ?Lab Results  ?Component Value Date  ? CREATININE 9.04 (H) 03/09/2022  ? ?Lab Results  ?Component Value Date  ? INR 1.3 (H) 02/26/2022  ? ?CBG (last 3)  ?Recent Labs  ?  03/08/22 ?1625 03/08/22 ?2124 03/09/22 ?0703  ?GLUCAP 112* 92 109*  ? ? ?PROBLEM LIST:   ? ?Principal Problem: ?  Septic shock ([place]) ?Active Problems: ?  Pressure injury of skin ?  Encounter for central line placement ? ? ?CURRENT MEDS:  ? ? aspirin EC  81 mg Oral Daily  ? chlorhexidine gluconate (MEDLINE KIT)  15 mL Mouth Rinse BID  ? Chlorhexidine Gluconate Cloth  6 each Topical Q0600  ? darbepoetin (ARANESP)  injection - DIALYSIS  60 mcg Intravenous Q Sat-HD  ? dronabinol  2.5 mg Oral QAC lunch  ? feeding supplement  237 mL Oral QID  ? folic acid  1 mg Oral Daily  ? guaiFENesin  600 mg Oral BID  ? [START ON 03/10/2022] heparin injection (subcutaneous)  5,000 Units Subcutaneous Q8H  ? heparin sodium (porcine)      ? insulin aspart  0-15 Units Subcutaneous TID WC  ? insulin aspart  0-5 Units Subcutaneous QHS  ? leptospermum manuka honey  1 application. Topical Daily  ? lidocaine-EPINEPHrine      ? mouth rinse  15 mL Mouth Rinse BID  ? melatonin  3 mg Oral QHS  ? metoprolol tartrate  25 mg Oral TID  ? multivitamin  1 tablet Oral QHS  ? sevelamer carbonate  800 mg Oral TID WC  ? sodium chloride flush  10-40 mL Intracatheter Q12H  ? ? ?Jeffrey Ayala ?Office: [phone] ?03/09/2022 ? ?

## 2022-03-10 ENCOUNTER — Encounter (HOSPITAL_COMMUNITY): Payer: Self-pay | Admitting: Pulmonary Disease

## 2022-03-10 ENCOUNTER — Inpatient Hospital Stay (HOSPITAL_COMMUNITY): Payer: BC Managed Care – PPO

## 2022-03-10 DIAGNOSIS — A419 Sepsis, unspecified organism: Secondary | ICD-10-CM | POA: Diagnosis not present

## 2022-03-10 DIAGNOSIS — E440 Moderate protein-calorie malnutrition: Secondary | ICD-10-CM | POA: Insufficient documentation

## 2022-03-10 DIAGNOSIS — R6521 Severe sepsis with septic shock: Secondary | ICD-10-CM | POA: Diagnosis not present

## 2022-03-10 DIAGNOSIS — E43 Unspecified severe protein-calorie malnutrition: Secondary | ICD-10-CM

## 2022-03-10 LAB — GASTROINTESTINAL PANEL BY PCR, STOOL (REPLACES STOOL CULTURE)

## 2022-03-10 LAB — COMPREHENSIVE METABOLIC PANEL
ALT: 41 U/L (ref 0–44)
AST: 29 U/L (ref 15–41)
Albumin: 2.3 g/dL — ABNORMAL LOW (ref 3.5–5.0)
Alkaline Phosphatase: 183 U/L — ABNORMAL HIGH (ref 38–126)
Anion gap: 20 — ABNORMAL HIGH (ref 5–15)
BUN: 102 mg/dL — ABNORMAL HIGH (ref 8–23)
CO2: 17 mmol/L — ABNORMAL LOW (ref 22–32)
Calcium: 8 mg/dL — ABNORMAL LOW (ref 8.9–10.3)
Chloride: 97 mmol/L — ABNORMAL LOW (ref 98–111)
Creatinine, Ser: 11.6 mg/dL — ABNORMAL HIGH (ref 0.61–1.24)
GFR, Estimated: 5 mL/min — ABNORMAL LOW (ref >60)
Glucose, Bld: 129 mg/dL — ABNORMAL HIGH (ref 70–99)
Potassium: 5.2 mmol/L — ABNORMAL HIGH (ref 3.5–5.1)
Sodium: 134 mmol/L — ABNORMAL LOW (ref 135–145)
Total Bilirubin: 2.9 mg/dL — ABNORMAL HIGH (ref 0.3–1.2)
Total Protein: 6.9 g/dL (ref 6.5–8.1)

## 2022-03-10 LAB — CBC WITH DIFFERENTIAL/PLATELET
Abs Immature Granulocytes: 0.18 10*3/uL — ABNORMAL HIGH (ref 0.00–0.07)
Basophils Absolute: 0.1 10*3/uL (ref 0.0–0.1)
Basophils Relative: 0 %
Eosinophils Absolute: 0 10*3/uL (ref 0.0–0.5)
Eosinophils Relative: 0 %
HCT: 23.1 % — ABNORMAL LOW (ref 39.0–52.0)
Hemoglobin: 7.1 g/dL — ABNORMAL LOW (ref 13.0–17.0)
Immature Granulocytes: 2 %
Lymphocytes Relative: 8 %
Lymphs Abs: 1 10*3/uL (ref 0.7–4.0)
MCH: 28.9 pg (ref 26.0–34.0)
MCHC: 30.7 g/dL (ref 30.0–36.0)
MCV: 93.9 fL (ref 80.0–100.0)
Monocytes Absolute: 1.3 10*3/uL — ABNORMAL HIGH (ref 0.1–1.0)
Monocytes Relative: 10 %
Neutro Abs: 9.9 10*3/uL — ABNORMAL HIGH (ref 1.7–7.7)
Neutrophils Relative %: 80 %
Platelets: 330 10*3/uL (ref 150–400)
RBC: 2.46 MIL/uL — ABNORMAL LOW (ref 4.22–5.81)
RDW: 16.4 % — ABNORMAL HIGH (ref 11.5–15.5)
WBC: 12.4 10*3/uL — ABNORMAL HIGH (ref 4.0–10.5)
nRBC: 0 % (ref 0.0–0.2)

## 2022-03-10 LAB — GLUCOSE, CAPILLARY
Glucose-Capillary: 121 mg/dL — ABNORMAL HIGH (ref 70–99)
Glucose-Capillary: 132 mg/dL — ABNORMAL HIGH (ref 70–99)
Glucose-Capillary: 143 mg/dL — ABNORMAL HIGH (ref 70–99)
Glucose-Capillary: 128 mg/dL — ABNORMAL HIGH (ref 70–99)

## 2022-03-10 LAB — LACTIC ACID, PLASMA: Lactic Acid, Venous: 1.3 mmol/L (ref 0.5–1.9)

## 2022-03-10 MED ORDER — HEPARIN SODIUM (PORCINE) 1000 UNIT/ML IJ SOLN
3800.0000 [IU] | INTRAMUSCULAR | Status: DC | PRN
  Administered 2022-03-18 – 2022-03-25 (×3): 3800 [IU]
  Filled 2022-03-10: qty 3.8
  Filled 2022-03-10 (×2): qty 4
  Filled 2022-03-10 (×2): qty 3.8
  Filled 2022-03-10 (×5): qty 4

## 2022-03-10 MED ORDER — SEVELAMER CARBONATE 800 MG PO TABS
2400.0000 mg | ORAL_TABLET | Freq: Three times a day (TID) | ORAL | Status: DC
  Administered 2022-03-10 – 2022-03-13 (×7): 2400 mg via ORAL
  Filled 2022-03-10 (×7): qty 3

## 2022-03-10 MED ORDER — SODIUM CHLORIDE 0.9 % IV SOLN
1.0000 g | Freq: Every day | INTRAVENOUS | Status: DC
  Administered 2022-03-10 – 2022-03-20 (×11): 1 g via INTRAVENOUS
  Filled 2022-03-10 (×7): qty 10
  Filled 2022-03-10: qty 1
  Filled 2022-03-10 (×7): qty 10

## 2022-03-10 MED ORDER — VANCOMYCIN HCL 2000 MG/400ML IV SOLN
2000.0000 mg | Freq: Once | INTRAVENOUS | Status: AC
  Administered 2022-03-10: 2000 mg via INTRAVENOUS
  Filled 2022-03-10: qty 400

## 2022-03-10 MED ORDER — ACETAMINOPHEN 500 MG PO TABS
1000.0000 mg | ORAL_TABLET | Freq: Three times a day (TID) | ORAL | Status: DC | PRN
  Administered 2022-03-10 – 2022-03-14 (×5): 1000 mg via NASOGASTRIC
  Filled 2022-03-10 (×5): qty 2

## 2022-03-10 MED ORDER — HEPARIN SODIUM (PORCINE) 1000 UNIT/ML IJ SOLN
INTRAMUSCULAR | Status: AC
  Administered 2022-03-10: 3800 [IU]
  Filled 2022-03-10: qty 4

## 2022-03-10 MED ORDER — INSULIN ASPART 100 UNIT/ML IJ SOLN
0.0000 [IU] | INTRAMUSCULAR | Status: DC
  Administered 2022-03-10 – 2022-03-25 (×32): 2 [IU] via SUBCUTANEOUS
  Administered 2022-03-25: 3 [IU] via SUBCUTANEOUS
  Administered 2022-03-26 – 2022-03-27 (×4): 2 [IU] via SUBCUTANEOUS

## 2022-03-10 NOTE — Progress Notes (Signed)
?[facility] ?Progress Note  ? ? ?Assessment/ Plan:   ?AKI: in the setting of severe septic shock likely ATN.  Normal kidney function baseline ?            - started CRRT 02/26/22 - 4/11 ? - tolerated dialysis without significant issues ? -Maintain TTS schedule for now; HD 4/15 (sat) w/ 2L net UF. bladder scan 4/18 am only 163m ?Seen on hd  ?2K bath 2.5L net UF as tolerated (so far 1.5L net) ?RIJ TC (appreciate VIR exchanging over from temp) ? ? -monitor for signs of recovery -> none so far. Plan on dialyzing through Sat and then can assess for any signs of recovery Mon. ? -Minimal urine output and certainly inadequate. ? ?  ?2.  Septic shock ?            - severe initially w/ high pressors requirement now improved.  Has now completed antibiotics ?  ?3.  Scrotal Edema ?            - urology following.  Urology feels this is unlikely the source of his infection. Has improved. Continue w/ volume removal on HD ?  ?4.  Acute hypoxic RF ?            - Now extubated significantly improved.  Continue to monitor ? ?5.  Thrombocytopenia/ coagulopathy: Likely related cirrhosis and sepsis.  Now resolved ? ?6.  Anemia: Hemoglobin 7. 669m of darbo given on 4/15. Transfusions if needed ? ? ?7.  Afib with RVR- hep--> bivalirudin gtt -> now holding anticoagulation ? ?8.  Foot wounds: associated with high-dose vasopressor requirements. Vascular surgery consulted ? ?9. Hyperphosphatemia: started on sevelamer -> incr to 3 tabs TIDM ?  ?10. Dispo: Floor status.  May need to start process of arranging outpatient hemodialysis.  Rehabilitation will also be a factor ? ?Subjective:   ? ?Patient awake but confused on dialysis; denies f/c/n/v/sob.  ?  ? ?Objective:   ?BP 115/60 (BP Location: Left Arm)   Pulse (!) 102   Temp 99.6 ?F (37.6 ?C) (Oral)   Resp (!) 21   Ht '5\' 10"'  (1.778 m)   Wt (!) 139.7 kg   SpO2 98%   BMI 44.19 kg/m?  ? ?Intake/Output Summary (Last 24 hours) at 03/10/2022 0934 ?Last data filed at 03/10/2022  0400 ?Gross per 24 hour  ?Intake 160.5 ml  ?Output 0 ml  ?Net 160.5 ml  ? ?Weight change: 1.361 kg ? ?Physical Exam: ?GEYDX:AJOINalert, but confused, no distress ?HEENT eyes opened, no nasal discharge ?PULM bilateral chest rise with no increased work of breathing ?CV Normal rate, no audible rub ?ABD distended, soft, bowel sounds present ?GU: swollen scrotum but overall improved ?EXT LEE improved, mottled and cool bilateral soles of feet and toes, dry gangrene b/l worse on the right side ?ACCESS: RIJ TC ? ?Imaging: ?IR Fluoro Guide CV Line Right ? ?Result Date: 03/09/2022 ?INDICATION: AKI requiring HD initiation. EXAM: TUNNELED CENTRAL VENOUS HEMODIALYSIS CATHETER PLACEMENT WITH ULTRASOUND AND FLUOROSCOPIC GUIDANCE MEDICATIONS: Ancef 2 gm IV . The antibiotic was given in an appropriate time interval prior to skin puncture. ANESTHESIA/SEDATION: Moderate (conscious) sedation was employed during this procedure. A total of Versed 1 mg and Fentanyl 50 mcg was administered intravenously. Moderate Sedation Time: 12 minutes. The patient's level of consciousness and vital signs were monitored continuously by radiology nursing throughout the procedure under my direct supervision. FLUOROSCOPY TIME:  Fluoroscopic dose; 3 mGy COMPLICATIONS: None immediate. PROCEDURE: Informed written consent was  obtained from the the patient and/or patient's representative after a discussion of the risks, benefits, and alternatives to treatment. Questions regarding the procedure were encouraged and answered. The RIGHT neck and chest were prepped with chlorhexidine in a sterile fashion, and a sterile drape was applied covering the operative field. Maximum barrier sterile technique with sterile gowns and gloves were used for the procedure. A timeout was performed prior to the initiation of the procedure. After creating a small venotomy incision, a micropuncture kit was utilized to access the internal jugular vein. Real-time ultrasound guidance was  utilized for vascular access including the acquisition of a permanent ultrasound image documenting patency of the accessed vessel. The microwire was utilized to measure appropriate catheter length. A stiff Glidewire was advanced to the level of the IVC and the micropuncture sheath was exchanged for a peel-away sheath. A palindrome tunneled hemodialysis catheter measuring 20 cm from tip to cuff was tunneled in a retrograde fashion from the anterior chest wall to the venotomy incision. The catheter was then placed through the peel-away sheath with tips ultimately positioned within the superior aspect of the right atrium. Final catheter positioning was confirmed and documented with a spot radiographic image. The catheter aspirates and flushes normally. The catheter was flushed with appropriate volume heparin dwells. The catheter exit site was secured with a 0-Prolene retention suture. The venotomy incision was closed with Dermabond. Dressings were applied. The patient tolerated the procedure well without immediate post procedural complication. IMPRESSION: Successful placement of 23 cm tip to cuff tunneled hemodialysis catheter via the RIGHT internal jugular vein, as above The tip of the catheter is positioned within the proximal RIGHT atrium. The catheter is ready for immediate use. Michaelle Birks, MD [facility] Electronically Signed   By: Michaelle Birks M.D.   On: 03/09/2022 09:38  ? ?IR US Guide Vasc Access Right ? ?Result Date: 03/09/2022 ?INDICATION: AKI requiring HD initiation. EXAM: TUNNELED CENTRAL VENOUS HEMODIALYSIS CATHETER PLACEMENT WITH ULTRASOUND AND FLUOROSCOPIC GUIDANCE MEDICATIONS: Ancef 2 gm IV . The antibiotic was given in an appropriate time interval prior to skin puncture. ANESTHESIA/SEDATION: Moderate (conscious) sedation was employed during this procedure. A total of Versed 1 mg and Fentanyl 50 mcg was administered intravenously. Moderate  Sedation Time: 12 minutes. The patient's level of consciousness and vital signs were monitored continuously by radiology nursing throughout the procedure under my direct supervision. FLUOROSCOPY TIME:  Fluoroscopic dose; 3 mGy COMPLICATIONS: None immediate. PROCEDURE: Informed written consent was obtained from the the patient and/or patient's representative after a discussion of the risks, benefits, and alternatives to treatment. Questions regarding the procedure were encouraged and answered. The RIGHT neck and chest were prepped with chlorhexidine in a sterile fashion, and a sterile drape was applied covering the operative field. Maximum barrier sterile technique with sterile gowns and gloves were used for the procedure. A timeout was performed prior to the initiation of the procedure. After creating a small venotomy incision, a micropuncture kit was utilized to access the internal jugular vein. Real-time ultrasound guidance was utilized for vascular access including the acquisition of a permanent ultrasound image documenting patency of the accessed vessel. The microwire was utilized to measure appropriate catheter length. A stiff Glidewire was advanced to the level of the IVC and the micropuncture sheath was exchanged for a peel-away sheath. A palindrome tunneled hemodialysis catheter measuring 20 cm from tip to cuff was tunneled in a retrograde fashion from the anterior chest wall to the venotomy incision. The catheter  was then placed through the peel-away sheath with tips ultimately positioned within the superior aspect of the right atrium. Final catheter positioning was confirmed and documented with a spot radiographic image. The catheter aspirates and flushes normally. The catheter was flushed with appropriate volume heparin dwells. The catheter exit site was secured with a 0-Prolene retention suture. The venotomy incision was closed with Dermabond. Dressings were applied. The patient tolerated the procedure  well without immediate post procedural complication. IMPRESSION: Successful placement of 23 cm tip to cuff tunneled hemodialysis catheter via the RIGHT internal jugular vein, as above The tip of the catheter is posi

## 2022-03-10 NOTE — Progress Notes (Addendum)
? ?Progress Note ? ?Patient Name: Jeffrey Ayala ?Date of Encounter: 03/10/2022 ? ?[facility] Cardiologist: Candee Furbish, MD  ? ?Subjective  ? ?Seen during dialysis. Confused.  ? ?Inpatient Medications  ?  ?Scheduled Meds: ? aspirin EC  81 mg Oral Daily  ? chlorhexidine gluconate (MEDLINE KIT)  15 mL Mouth Rinse BID  ? Chlorhexidine Gluconate Cloth  6 each Topical Q0600  ? darbepoetin (ARANESP) injection - DIALYSIS  60 mcg Intravenous Q Sat-HD  ? dronabinol  2.5 mg Oral QAC lunch  ? feeding supplement  237 mL Oral QID  ? feeding supplement (PROSource TF)  45 mL Per Tube QID  ? folic acid  1 mg Oral Daily  ? guaiFENesin  600 mg Oral BID  ? heparin injection (subcutaneous)  5,000 Units Subcutaneous Q8H  ? insulin aspart  0-15 Units Subcutaneous Q4H  ? leptospermum manuka honey  1 application. Topical Daily  ? mouth rinse  15 mL Mouth Rinse BID  ? melatonin  3 mg Oral QHS  ? metoprolol tartrate  37.5 mg Oral TID  ? multivitamin  1 tablet Oral QHS  ? sevelamer carbonate  2,400 mg Oral TID WC  ? sodium chloride flush  10-40 mL Intracatheter Q12H  ? ?Continuous Infusions: ? sodium chloride Stopped (03/06/22 2355)  ? feeding supplement (NEPRO CARB STEADY) 40 mL/hr at 03/10/22 0300  ? ?PRN Meds: ?acetaminophen, docusate sodium, heparin sodium (porcine), metoprolol tartrate, oxyCODONE, phenol, polyethylene glycol, sodium chloride flush, white petrolatum  ? ?Vital Signs  ?  ?Vitals:  ? 03/10/22 0830 03/10/22 0900 03/10/22 0930 03/10/22 1000  ?BP: 107/61 115/60 (!) 153/55 (!) 92/41  ?Pulse: (!) 125 (!) 102 (!) 125 (!) 121  ?Resp:   17   ?Temp:      ?TempSrc:      ?SpO2:      ?Weight:      ?Height:      ? ? ?Intake/Output Summary (Last 24 hours) at 03/10/2022 1024 ?Last data filed at 03/10/2022 0400 ?Gross per 24 hour  ?Intake 160.5 ml  ?Output 0 ml  ?Net 160.5 ml  ? ? ?  03/10/2022  ?  6:30 AM 03/10/2022  ?  5:00 AM 03/09/2022  ?  5:09 AM  ?Last 3 Weights  ?Weight (lbs) 307 lb 15.7 oz 308 lb 305 lb  ?Weight (kg) 139.7 kg  139.708 kg 138.347 kg  ?   ? ?Telemetry  ?  ?Atrial fibrillation  100-130s- Personally Reviewed ? ?ECG  ?  ?N/A ? ?Physical Exam  ? ?GEN: No acute distress.   ?Neck: No JVD ?Cardiac: Ir IR tachycardic , no murmurs, rubs, or gallops.  ?Respiratory: Clear to auscultation bilaterally. ?GI: Soft, nontender, non-distended  ?MS: foot discoloration. Digits are blue/black ?Neuro:  Nonfocal  ?Psych: Normal affect  ? ?Labs  ?  ?High Sensitivity Troponin:   ?Recent Labs  ?Lab 02/22/22 ?1805 02/22/22 ?2007 02/23/22 ?8366 02/23/22 ?1208  ?TROPONINIHS 525* 1,834* 2,807* 3,550*  ?   ?Chemistry ?Recent Labs  ?Lab 03/06/22 ?0530 03/07/22 ?0500 03/08/22 ?0530 03/09/22 ?2947 03/10/22 ?0337  ?NA 133* 136 134* 136 134*  ?K 3.9 4.1 4.2 4.4 5.2*  ?CL 98 100 98 100 97*  ?CO2 24 21* 22 18* 17*  ?GLUCOSE 123* 120* 108* 110* 129*  ?BUN 47* 75* 55* 79* 102*  ?CREATININE 4.68* 7.12* 6.49* 9.04* 11.60*  ?CALCIUM 8.2* 8.0* 8.0* 8.3* 8.0*  ?MG 2.6* 2.7* 2.5*  --   --   ?PROT  --  6.1*  --   --  6.9  ?ALBUMIN 2.3* 2.2*  2.2* 2.2* 2.3* 2.3*  ?AST  --  46*  --   --  29  ?ALT  --  78*  --   --  41  ?ALKPHOS  --  266*  --   --  183*  ?BILITOT  --  3.4*  --   --  2.9*  ?GFRNONAA 14* 8* 9* 6* 5*  ?ANIONGAP '11 15 14 ' 18* 20*  ?  ?Lipids No results for input(s): CHOL, TRIG, HDL, LABVLDL, LDLCALC, CHOLHDL in the last 168 hours.  ?Hematology ?Recent Labs  ?Lab 03/07/22 ?0500 03/09/22 ?2202 03/10/22 ?0337  ?WBC 10.2 10.8* 12.4*  ?RBC 2.36* 2.41* 2.46*  ?HGB 7.0* 7.1* 7.1*  ?HCT 22.2* 22.3* 23.1*  ?MCV 94.1 92.5 93.9  ?MCH 29.7 29.5 28.9  ?MCHC 31.5 31.8 30.7  ?RDW 17.0* 16.6* 16.4*  ?PLT 183 287 330  ? ?Thyroid No results for input(s): TSH, FREET4 in the last 168 hours.  ?BNPNo results for input(s): BNP, PROBNP in the last 168 hours.  ?DDimer No results for input(s): DDIMER in the last 168 hours.  ? ?Radiology  ?  ?IR Fluoro Guide CV Line Right ? ?Result Date: 03/09/2022 ?INDICATION: AKI requiring HD initiation. EXAM: TUNNELED CENTRAL VENOUS HEMODIALYSIS  CATHETER PLACEMENT WITH ULTRASOUND AND FLUOROSCOPIC GUIDANCE MEDICATIONS: Ancef 2 gm IV . The antibiotic was given in an appropriate time interval prior to skin puncture. ANESTHESIA/SEDATION: Moderate (conscious) sedation was employed during this procedure. A total of Versed 1 mg and Fentanyl 50 mcg was administered intravenously. Moderate Sedation Time: 12 minutes. The patient's level of consciousness and vital signs were monitored continuously by radiology nursing throughout the procedure under my direct supervision. FLUOROSCOPY TIME:  Fluoroscopic dose; 3 mGy COMPLICATIONS: None immediate. PROCEDURE: Informed written consent was obtained from the the patient and/or patient's representative after a discussion of the risks, benefits, and alternatives to treatment. Questions regarding the procedure were encouraged and answered. The RIGHT neck and chest were prepped with chlorhexidine in a sterile fashion, and a sterile drape was applied covering the operative field. Maximum barrier sterile technique with sterile gowns and gloves were used for the procedure. A timeout was performed prior to the initiation of the procedure. After creating a small venotomy incision, a micropuncture kit was utilized to access the internal jugular vein. Real-time ultrasound guidance was utilized for vascular access including the acquisition of a permanent ultrasound image documenting patency of the accessed vessel. The microwire was utilized to measure appropriate catheter length. A stiff Glidewire was advanced to the level of the IVC and the micropuncture sheath was exchanged for a peel-away sheath. A palindrome tunneled hemodialysis catheter measuring 20 cm from tip to cuff was tunneled in a retrograde fashion from the anterior chest wall to the venotomy incision. The catheter was then placed through the peel-away sheath with tips ultimately positioned within the superior aspect of the right atrium. Final catheter positioning was  confirmed and documented with a spot radiographic image. The catheter aspirates and flushes normally. The catheter was flushed with appropriate volume heparin dwells. The catheter exit site was secured with a 0-Prolene retention suture. The venotomy incision was closed with Dermabond. Dressings were applied. The patient tolerated the procedure well without immediate post procedural complication. IMPRESSION: Successful placement of 23 cm tip to cuff tunneled hemodialysis catheter via the RIGHT internal jugular vein, as above The tip of the catheter is positioned within the proximal RIGHT atrium. The catheter is ready for immediate use. Michaelle Birks, MD  [facility] Radiology Electronically Signed   By: Michaelle Birks M.D.   On: 03/09/2022 09:38  ? ?IR US Guide Vasc Access Right ? ?Result Date: 03/09/2022 ?INDICATION: AKI requiring HD initiation. EXAM: TUNNELED CENTRAL VENOUS HEMODIALYSIS CATHETER PLACEMENT WITH ULTRASOUND AND FLUOROSCOPIC GUIDANCE MEDICATIONS: Ancef 2 gm IV . The antibiotic was given in an appropriate time interval prior to skin puncture. ANESTHESIA/SEDATION: Moderate (conscious) sedation was employed during this procedure. A total of Versed 1 mg and Fentanyl 50 mcg was administered intravenously. Moderate Sedation Time: 12 minutes. The patient's level of consciousness and vital signs were monitored continuously by radiology nursing throughout the procedure under my direct supervision. FLUOROSCOPY TIME:  Fluoroscopic dose; 3 mGy COMPLICATIONS: None immediate. PROCEDURE: Informed written consent was obtained from the the patient and/or patient's representative after a discussion of the risks, benefits, and alternatives to treatment. Questions regarding the procedure were encouraged and answered. The RIGHT neck and chest were prepped with chlorhexidine in a sterile fashion, and a sterile drape was applied covering the operative field. Maximum barrier sterile  technique with sterile gowns and gloves were used for the procedure. A timeout was performed prior to the initiation of the procedure. After creating a small venotomy incision, a micropuncture kit was utilized to [employer]

## 2022-03-10 NOTE — Progress Notes (Addendum)
?  03/10/22 1045  ?Vitals  ?Temp 99.1 ?F (37.3 ?C)  ?Temp Source Oral  ?BP 130/69  ?BP Location Left Arm  ?BP Method Automatic  ?Patient Position (if appropriate) Lying  ?Pulse Rate (!) 119  ?Pulse Rate Source Monitor  ?Resp 19  ?Oxygen Therapy  ?SpO2 100 %  ?O2 Device Room Air  ?Dialysis Weight  ?Weight 132.9 kg  ?Type of Weight Post-Dialysis  ?Post-Hemodialysis Assessment  ?Rinseback Volume (mL) 250 mL  ?KECN 303 V  ?Dialyzer Clearance Lightly streaked  ?Duration of HD Treatment -hour(s) 3.5 hour(s)  ?Hemodialysis Intake (mL) 500 mL  ?UF Total -Machine (mL) 2832 mL  ?Net UF (mL) 2332 mL  ?Tolerated HD Treatment  ?(tx complete, pt restless throughout tx, HR fluctuatiing in AFIB, HR high as 130s nonsustaining)  ?Post-Hemodialysis Comments tx complete, pt stable  ? ?Tx complete, pt stable. Goal  met, Uf paused at times due to drop in blood pressure, asymptomatic. HR fluctuating throughout tx, btwn 110s-130s non sustaining. Cardiology on unit to assess and aware.  ?

## 2022-03-10 NOTE — Progress Notes (Addendum)
Nutrition Note - Calorie Count ? ?24 hour calorie count completed. ?Patient ate a few bites of dinner yesterday. He did not eat breakfast or lunch today because he was at dialysis. Wife continues to encourage intake of meals and supplements. She assists him with all meals because he is too weak to feed himself.  ?Oral intake is meeting 0% of estimated needs. ? ?Cortrak tube was placed 4/17. Tip is gastric, approaching the pylorus.  ?Currently receiving Nepro at 50 ml/h with Prosource TF 45 ml QID to provide 2320 kcal, 141 gm protein, 872 ml free water daily. Patient is tolerating TF without difficulty. ? ?Labs and medications reviewed.  ?Rectal tube in place with liquid stool output. GI pathogen panel sent 4/17. ? ?INTERVENTION: ?Continue TF via Cortrak: Nepro at 50 ml/h with Prosource TF 45 ml QID to meet 100% of estimated needs. ?Continue regular diet; encourage POs. ?Continue Rena-vit daily. ?D/C calorie count. ? ? ?Lucas Mallow RD, LDN, CNSC ?Please refer to Amion for contact information.                                                       ? ?

## 2022-03-10 NOTE — Progress Notes (Signed)
Physical Therapy Treatment ?Patient Details ?Name: Jeffrey Ayala ?MRN: [id number] ?DOB: 17-Jul-1961 ?Today's Date: 03/10/2022 ? ? ?History of Present Illness Pt adm 4/2 c/o N/V/D and found to have septic shock due to scrotal cellulitis vs RLL PNA.  Pt intubated 4/3 and brief cardiac arrest post intubation. [name] started 4/5.  Pt with AKI and started on CRRT on 4/6. Pt extubated 4/10. Pt also with afib with rvr. PMH - Bil TKR, migraines ? ?  ?PT Comments  ? ? Pt received in supine, lethargic and unable to answer orientation questions, RN provided clearance for PT session. Pt able to perform supine (from [facility] elevated ~35*) to long sit in bed with +2 maxA and needing +1 maxA to maintain long sitting with BUE holding bed side rails. Pt able to tolerate long sitting ~2 minutes prior to fatigue/resting back to supine with maxA to guide trunk.  Pt repositioned up in high fowler's position to simulate chair posture with pillows maintaining upright right/BUE supported by pillows at end of session to promote wakefulness, improved pulmonary clearance and prevalon boots on for pressure relief.    ?Recommendations for follow up therapy are one component of a multi-disciplinary discharge planning process, led by the attending physician.  Recommendations may be updated based on patient status, additional functional criteria and insurance authorization. ? ?Follow Up Recommendations ? Acute inpatient rehab (3hours/day) ?  ?  ?Assistance Recommended at Discharge Frequent or constant Supervision/Assistance  ?Patient can return home with the following Two people to help with walking and/or transfers;Two people to help with bathing/dressing/bathroom;Help with stairs or ramp for entrance;Assist for transportation ?  ?Equipment Recommendations ? Wheelchair (measurements PT);Wheelchair cushion (measurements PT);Hospital bed;Other (comment) (hoyer lift)  ?  ?Recommendations for Other Services   ? ? ?  ?Precautions / Restrictions  Precautions ?Precautions: Fall ?Precaution Comments: Enteric precs; feet and scrotum with gangrene and blisters, fecal tube, cortrak, monitor HR/BP ?Restrictions ?Weight Bearing Restrictions: No  ?  ? ?Mobility ? Bed Mobility ?Overal bed mobility: Needs Assistance ?Bed Mobility: Supine to Sit, Sit to Supine ?  ?  ?Supine to sit: Max assist, +2 for physical assistance, +2 for safety/equipment, HOB elevated ?Sit to supine: +2 for physical assistance, +2 for safety/equipment, Max assist, HOB elevated ?  ?General bed mobility comments: using bed pad transitioned to long sit in bed x2 mins; pt lethargic and falling asleep throughout, at times having trouble maintaining cervical extension; with hand over hand assist pt able to maintain UE on bed side rails but needing maxA +1-2 to remain upright in long sitting (with end of bed lowered to simulate chair posture); pt noted to be soiled upon return to supine, RN notified pt will need a bath; pt totalA for posterior supine scooting toward HOB ?  ? ?Transfers ?  ?  ?  ?  ?  ?  ?  ?  ?  ?General transfer comment: defer; pt too lethargic to attempt ?  ? ? ?  ?Balance Overall balance assessment: Needs assistance ?Sitting-balance support: Bilateral upper extremity supported, Feet unsupported ?Sitting balance-Leahy Scale: Zero ?Sitting balance - Comments: ~2 mins with maxA +1-2, lethargic so defer EOB ?Postural control: Posterior lean, Right lateral lean ?  ?   ? ?  ?Cognition Arousal/Alertness: Lethargic ?Behavior During Therapy: Flat affect ?Overall Cognitive Status: Impaired/Different from baseline ?Area of Impairment: Attention, Memory, Following commands, Awareness, Problem solving, Orientation, Safety/judgement ?  ?  ?  ?  ?  ?  ?  ?  ?Orientation Level:  Disoriented to, Person, Place, Situation, Time (pt unable to answer any orientation questions) ?Current Attention Level: Focused ?Memory: Decreased short-term memory, Decreased recall of precautions ?Following Commands:  Follows one step commands consistently, Follows one step commands with increased time ?  ?Awareness: Emergent ?Problem Solving: Slow processing, Decreased initiation, Difficulty sequencing, Requires verbal cues, Requires tactile cues ?  ?  ?  ? ?  ?Exercises Other Exercises ?Other Exercises: PROM to BUEs (pt unable to assist with ROM) elbow/shoulder flex/ext and IR, pt grimacing with L shoulder flexion >70* so ROM limited within tolerance ?Other Exercises: BLE AAROM: ankle pumps, heel slides, hip abduction x10 reps ea ? ?  ?General Comments General comments (skin integrity, edema, etc.): HR 110-116 bpm while in long sit in bed; SpO2 WFL on RA; bowel incontinence and fecal tube leaking, RN notified ?  ?  ? ?Pertinent Vitals/Pain Pain Assessment ?Pain Assessment: Faces ?Faces Pain Scale: Hurts even more ?Pain Location: LUE (?) pt unable to localize ?Pain Descriptors / Indicators: Discomfort, Grimacing ?Pain Intervention(s): Limited activity within patient's tolerance, Monitored during session, Repositioned  ? ? ? ?PT Goals (current goals can now be found in the care plan section) Acute Rehab PT Goals ?Patient Stated Goal: not stated ?PT Goal Formulation: With patient/family ?Time For Goal Achievement: 03/16/22 ?Progress towards PT goals: Progressing toward goals (slow progress) ? ?  ?Frequency ? ? ? Min 3X/week ? ? ? ?  ?PT Plan Current plan remains appropriate  ? ? ?   ?AM-PAC PT "6 Clicks" Mobility   ?Outcome Measure ? Help needed turning from your back to your side while in a flat bed without using bedrails?: Total ?Help needed moving from lying on your back to sitting on the side of a flat bed without using bedrails?: Total ?Help needed moving to and from a bed to a chair (including a wheelchair)?: Total ?Help needed standing up from a chair using your arms (e.g., wheelchair or bedside chair)?: Total ?Help needed to walk in hospital room?: Total ?Help needed climbing 3-5 steps with a railing? : Total ?6 Click  Score: 6 ? ?  ?End of Session   ?Activity Tolerance: Patient limited by lethargy ?Patient left: in bed;with call bell/phone within reach;with bed alarm set;Other (comment) (prevalon boots donned and repositioned; pt positioned in high fowlers position in bed with pillows to prevent L lean and under B elbows to promote neutral seated posture) ?Nurse Communication: Mobility status;Need for lift equipment;Other (comment) (lethargy and needs a bath/flex-seal leaking) ?PT Visit Diagnosis: Other abnormalities of gait and mobility (R26.89);Muscle weakness (generalized) (M62.81) ?  ? ? ?Time: 1610-9604 ?PT Time Calculation (min) (ACUTE ONLY): 19 min ? ?Charges:  $Therapeutic Activity: 8-22 mins          ?          ? ?Debbora Ang P., PTA ?Acute Rehabilitation Services ?Secure Chat Preferred 9a-5:30pm ?Office: [phone]  ? ? ?Zurie Platas M Delando Satter ?03/10/2022, 4:02 PM ? ?

## 2022-03-10 NOTE — Progress Notes (Signed)
Patient ID: Jeffrey Ayala, male   DOB: 1961/10/28, 61 y.o.   MRN: [id number] ? ?  ?Subjective: ?Pt receiving hemodialysis currently.  Denies scrotal pain. ? ?Objective: ?Vital signs in last 24 hours: ?Temp:  [97.3 ?F (36.3 ?C)-99.6 ?F (37.6 ?C)] 99.6 ?F (37.6 ?C) (04/18 0703) ?Pulse Rate:  [96-109] 106 (04/18 0707) ?Resp:  [17-26] 21 (04/18 0703) ?BP: (108-146)/(49-82) 129/56 (04/18 0707) ?SpO2:  [96 %-100 %] 98 % (04/18 0703) ?Weight:  [139.7 kg] 139.7 kg (04/18 0630) ? ?Intake/Output from previous day: ?04/17 0701 - 04/18 0700 ?In: 160.5 [NG/GT:160.5] ?Out: 0  ?Intake/Output this shift: ?No intake/output data recorded. ? ?Physical Exam:  ?General: Alert and oriented ?GU: The anterior aspect of his scrotum demonstrates stable eschar formation consistent with dry gangrenous change.  No crepitus, induration, fluctuance, drainage, etc to suggest acute infectious process.  ? ?Lab Results: ?Recent Labs  ?  03/09/22 ?4431 03/10/22 ?0337  ?HGB 7.1* 7.1*  ?HCT 22.3* 23.1*  ? ? ?  Latest Ref Rng & Units 03/10/2022  ?  3:37 AM 03/09/2022  ?  5:20 AM 03/07/2022  ?  5:00 AM  ?CBC  ?WBC 4.0 - 10.5 K/uL 12.4   10.8   10.2    ?Hemoglobin 13.0 - 17.0 g/dL 7.1   7.1   7.0    ?Hematocrit 39.0 - 52.0 % 23.1   22.3   22.2    ?Platelets 150 - 400 K/uL 330   287   183    ? ? ? ?BMET ?Recent Labs  ?  03/09/22 ?5400 03/10/22 ?0337  ?NA 136 134*  ?K 4.4 5.2*  ?CL 100 97*  ?CO2 18* 17*  ?GLUCOSE 110* 129*  ?BUN 79* 102*  ?CREATININE 9.04* 11.60*  ?CALCIUM 8.3* 8.0*  ? ? ? ?Studies/Results: ? ?Assessment/Plan: ?1) Dry gangrene of the scrotum: Local wound care would be appropriate.  If patient necessitates operative intervention per vascular surgery, would likely help facilitate wound healing to consider debridement of scrotum at the same setting. ? ? LOS: 16 days  ? ?[name] ?03/10/2022, 7:45 AM ? ?  ?

## 2022-03-10 NOTE — Progress Notes (Signed)
Pharmacy Antibiotic Note ? ?CORDAE MCCAREY is a 61 y.o. male admitted on 02/22/2022 with sepsis.  Pharmacy has been consulted for vancomycin and cefepime dosing. ? ?Most recent HD session this AM. ? ?Plan: ?Vancomycin 2g IV x 1- will re-dose after next HD ?Cefepime 1g IV q 24 hrs. ? ? ?Height: '5\' 10"'$  (177.8 cm) ?Weight: 132.9 kg (293 lb) ?IBW/kg (Calculated) : 73 ? ?Temp (24hrs), Avg:99.5 ?F (37.5 ?C), Min:97.3 ?F (36.3 ?C), Max:102.9 ?F (39.4 ?C) ? ?Recent Labs  ?Lab 03/05/22 ?0453 03/06/22 ?0530 03/07/22 ?0500 03/08/22 ?0530 03/09/22 ?1950 03/10/22 ?0337  ?WBC 13.5* 10.7* 10.2  --  10.8* 12.4*  ?CREATININE 4.65* 4.68* 7.12* 6.49* 9.04* 11.60*  ?  ?Estimated Creatinine Clearance: 9.2 mL/min (A) (by C-G formula based on SCr of 11.6 mg/dL (H)).   ? ?Allergies  ?Allergen Reactions  ? Codeine Hives  ?  Insomnia, jittery  ? Flomax [Tamsulosin Hcl] Nausea Only  ?  Nausea and fatigue  ? ? ?Antimicrobials this admission: ?4/2 flagyl x 1 ?4/2 vanc >> 4/5 ?4/2 cefepime >> 4/5 ?4/5 linezolid >> 4/7 ?4/5 pip/tazo >> 4/14 ? ?Dose adjustments this admission: ?4/4 VR 18 ?4/5 VR 15 ? ?Microbiology results: ?4/2 Urine: multiple species ?4/2 Blood: neg ?4/6 trach aspirate: rare Candida albicans ?4/2 C diff: neg ?4/2 MRSA PCR: neg ?4/2 COVID and flu: neg ? ?Thank you for allowing pharmacy to be a part of this patient?s care. ? ?[place] Crane, Pharm D, BCPS, BCCP ?Clinical Pharmacist ? 03/10/2022 5:27 PM  ? ?[facility] pharmacy phone numbers are listed on [email or web address] ? ? ?

## 2022-03-10 NOTE — Progress Notes (Signed)
?PROGRESS NOTE ? ?Jeffrey Ayala  ?DOB: 02/25/1961  ?PCP: Patient, No Pcp Per (Inactive) ?QAS:[id number]  ?[name]: 02/22/2022 ? LOS: 16 days  ?Hospital Day: 17 ? ?Brief narrative: ?Jeffrey Ayala is a 61 y.o. male with PMH significant for arthritis, bilateral knee replacement, gastric ulcer, kidney stones, eosinophilic esophagitis, migraines not on any regular meds.   ?Patient presented to the ED on 4/2 with complaint of nausea, vomiting, diarrhea and confusion for 24 hours.   ?EMS noted him to be in A-fib with a heart rate of 120-160 bpm. ?In the ED, he had a fever of 102.9. ?CT abdomen pelvis showed swollen scrotum concerning for infection, colon filled with diarrhea.  ?He was hypotensive requiring multiple vasopressors in the ICU.   ?4/3, patient required intubation and also had a brief cardiac arrest postintubation.   ?4/5, in an effort to correct vasoplegia, patient was also started on [name] ?Because of septic shock, patient's renal function worsened. ?4/6, patient was started on CRRT ?4/11, patient was extubated. ?4/14, patient was transferred out to [facility] service. ? ?Subjective: ?Patient was seen and examined this morning at dialysis.  Pleasant middle-aged Caucasian male. ?Not in distress.  Seems drowsy.  Has NG tube in place. ?I met with his wife in the room and had a long conversation. ? ?Principal Problem: ?  Septic shock ([place]) ?Active Problems: ?  Pressure injury of skin ?  Encounter for central line placement ?  Malnutrition of moderate degree ?  ?Assessment and Plan: ?Septic shock - POA ?-thought due to scrotal cellulitis and possible aspiration pneumonia from n/v ?-Blood culture did not show any growth.  Urine culture with multiple species. ?-He was seen by urologist.  No intervention was recommended.  Initially treated with broad-spectrum antibiotic coverage.  To complete course of IV Ancef today. ?-While in ICU, patient required multiple vasopressors including Levophed, epinephrine and  vasopressin.  Also given a trial of methylene blue. ?-Currently hemodynamically stable.  Off pressors. ?-Urology follow-up appreciated. ?Recent Labs  ?Lab 03/05/22 ?0453 03/06/22 ?0530 03/07/22 ?0500 03/09/22 ?7989 03/10/22 ?0337  ?WBC 13.5* 10.7* 10.2 10.8* 12.4*  ? ? ?Acute metabolic encephalopathy ?-Patient's mental status fluctuates.  Yesterday morning at the time of my evaluation, patient was drowsy.  Per his wife, his mental status significantly improved during the day but worsened after he got oxycodone.  Patient remains drowsy this morning probably because of uremia as well. ?-No evidence of intracranial infection. ?-4/15, CT head unremarkable. ?-Ammonia level unremarkable. ?-Continue to monitor mental status change ?  ?AKI from septic shock ?-Started CRRT 4/64/11, now on iHD ?-4/17, underwent TLC catheter placed ?-Nephrology following.  No urine output ? ?Diarrhea ?-Per wife, patient continues to have diarrhea since admission.  Currently has a Flexi-Seal in place. ?-GI pathogen panel sent on 4/17.  Pen ding report. ?-If no evidence of infection, will start on Imodium. ?-Currently has Flexi-Seal in place draining liquid stool.  Need to quantify ?   ?Dry gangrene of both feet ?-Secondary to vasospasm due to high-dose of vasopressors required for septic shock. ?-Vascular surgery following. ?-His wounds in both feet are continuing to demarcate.  Patient may ultimately require bilateral AKA  ? ?Acute anemia ?-Baseline hemoglobin normal.  Gradually downtrending hemoglobin level this hospitalization.  Currently remains between 7 and 8.  Patient did not require any PRBC transfusion this hospitalization. ?Recent Labs  ?  03/05/22 ?0453 03/06/22 ?0530 03/07/22 ?0500 03/09/22 ?2119 03/10/22 ?0337  ?HGB 7.5* 7.9* 7.0* 7.1* 7.1*  ?MCV 93.8  93.6 94.1 92.5 93.9  ? ? ?New onset of afib, presents on admission ?-echocardiogram with preserved lvef ?-remains in rate controlled afib ?-Was on heparin drip initially but it was  stopped because of drop in hemoglobin. ?-Cardiology consulted.  Currently on metoprolol 37.5 mg 3 times daily and aspirin 81 mg daily. ?-If hemoglobin improves, will consider Eliquis at discharge. ?-Per cardiology, plan for outpatient DCCV if remains in A-fib and able to tolerate AC. ? ?Shock liver ?-Liver enzymes elevated likely due to shock.  Improving numbers.   ?-CT abd did not show hepatobiliary abnormality ?-Repeat LFT tomorrow. ?Recent Labs  ?Lab 03/06/22 ?0530 03/07/22 ?0500 03/08/22 ?0530 03/09/22 ?7209 03/10/22 ?0337  ?AST  --  46*  --   --  29  ?ALT  --  78*  --   --  41  ?ALKPHOS  --  266*  --   --  183*  ?BILITOT  --  3.4*  --   --  2.9*  ?PROT  --  6.1*  --   --  6.9  ?ALBUMIN 2.3* 2.2*  2.2* 2.2* 2.3* 2.3*  ? ? ? ?Acute hypoxic respiratory failure ?-Initially required intubation, later extubated.  Currently on room air. ? ?Elevated troponin ?-Likely because of brief cardiac arrest and septic shock. ?-Echo showed EF of 60 to 65% with normal wall motion. ?-No further ischemic evaluation at this time per cardiology. ? ?Morbid obesity  ?-Body mass index is 42.04 kg/m?Marland Kitchen Patient has been advised to make an attempt to improve diet and exercise patterns to aid in weight loss. ? ?Poor oral intake ?-Due to altered mentation.  Dietitian consult appreciated.  Agree with core track plan. ? ?Goals of care ?  Code Status: Full Code  ? ? ?Mobility: Encourage PT participation once mental status improves ? ?Skin assessment:  ?Pressure Injury 02/22/22 Coccyx Medial Stage 2 -  Partial thickness loss of dermis presenting as a shallow open injury with a red, pink wound bed without slough. pink, painful (Active)  ?02/22/22 2330  ?Location: Coccyx  ?Location Orientation: Medial  ?Staging: Stage 2 -  Partial thickness loss of dermis presenting as a shallow open injury with a red, pink wound bed without slough.  ?Wound Description (Comments): pink, painful  ?Present on Admission: Yes  ? ? ?Nutritional status:  ?Body mass  index is 42.04 kg/m?Marland Kitchen  ?Nutrition Problem: Moderate Malnutrition ?Etiology: acute illness (septic shock, A fib, AKI) ?Signs/Symptoms: mild fat depletion, mild muscle depletion, moderate muscle depletion ? ? ? ? ?Diet:  ?Diet Order   ? ?       ?  Diet regular Room service appropriate? Yes; Fluid consistency: Thin  Diet effective now       ?  ? ?  ?  ? ?  ? ? ?DVT prophylaxis:  ?heparin injection 5,000 Units Start: 03/10/22 0600 ?SCDs Start: 02/22/22 2249 ?  ?Antimicrobials: Completed course of IV Ancef today ?Fluid: None ?Consultants: Nephrology, cardiology ?Family Communication: Wife at bedside ? ?Status is: Inpatient ? ?Continue in-hospital care because: Continues to have multiple active medical issues ?Level of care: Progressive  ? ?Dispo: The patient is from: Home ?             Anticipated d/c is to: Likely SNF ?             Patient currently is not medically stable to d/c. ?  Difficult to place patient No ? ? ? ? ?Infusions:  ? sodium chloride Stopped (03/06/22 2355)  ? feeding supplement (  NEPRO CARB STEADY) 40 mL/hr at 03/10/22 0300  ? ? ?Scheduled Meds: ? aspirin EC  81 mg Oral Daily  ? chlorhexidine gluconate (MEDLINE KIT)  15 mL Mouth Rinse BID  ? Chlorhexidine Gluconate Cloth  6 each Topical Q0600  ? darbepoetin (ARANESP) injection - DIALYSIS  60 mcg Intravenous Q Sat-HD  ? dronabinol  2.5 mg Oral QAC lunch  ? feeding supplement  237 mL Oral QID  ? feeding supplement (PROSource TF)  45 mL Per Tube QID  ? folic acid  1 mg Oral Daily  ? guaiFENesin  600 mg Oral BID  ? heparin injection (subcutaneous)  5,000 Units Subcutaneous Q8H  ? insulin aspart  0-15 Units Subcutaneous Q4H  ? leptospermum manuka honey  1 application. Topical Daily  ? mouth rinse  15 mL Mouth Rinse BID  ? melatonin  3 mg Oral QHS  ? metoprolol tartrate  37.5 mg Oral TID  ? multivitamin  1 tablet Oral QHS  ? sevelamer carbonate  2,400 mg Oral TID WC  ? sodium chloride flush  10-40 mL Intracatheter Q12H  ? ? ?PRN meds: ?acetaminophen,  docusate sodium, heparin sodium (porcine), metoprolol tartrate, phenol, polyethylene glycol, sodium chloride flush, white petrolatum  ? ?Antimicrobials: ?Anti-infectives (From admission, onward)  ? ? Start     Dose

## 2022-03-11 DIAGNOSIS — A419 Sepsis, unspecified organism: Secondary | ICD-10-CM | POA: Diagnosis not present

## 2022-03-11 DIAGNOSIS — I4891 Unspecified atrial fibrillation: Secondary | ICD-10-CM | POA: Diagnosis not present

## 2022-03-11 DIAGNOSIS — I96 Gangrene, not elsewhere classified: Secondary | ICD-10-CM | POA: Diagnosis not present

## 2022-03-11 DIAGNOSIS — R6521 Severe sepsis with septic shock: Secondary | ICD-10-CM | POA: Diagnosis not present

## 2022-03-11 LAB — URINALYSIS, ROUTINE W REFLEX MICROSCOPIC
Bilirubin Urine: NEGATIVE
Glucose, UA: NEGATIVE mg/dL
Ketones, ur: NEGATIVE mg/dL
Nitrite: NEGATIVE
Protein, ur: 100 mg/dL — AB
Specific Gravity, Urine: 1.011 (ref 1.005–1.030)
pH: 6 (ref 5.0–8.0)

## 2022-03-11 LAB — CBC
HCT: 24.1 % — ABNORMAL LOW (ref 39.0–52.0)
Hemoglobin: 7.5 g/dL — ABNORMAL LOW (ref 13.0–17.0)
MCH: 29.4 pg (ref 26.0–34.0)
MCHC: 31.1 g/dL (ref 30.0–36.0)
MCV: 94.5 fL (ref 80.0–100.0)
Platelets: 360 10*3/uL (ref 150–400)
RBC: 2.55 MIL/uL — ABNORMAL LOW (ref 4.22–5.81)
RDW: 16.3 % — ABNORMAL HIGH (ref 11.5–15.5)
WBC: 12.8 10*3/uL — ABNORMAL HIGH (ref 4.0–10.5)
nRBC: 0 % (ref 0.0–0.2)

## 2022-03-11 LAB — RENAL FUNCTION PANEL
Albumin: 2.3 g/dL — ABNORMAL LOW (ref 3.5–5.0)
Anion gap: 17 — ABNORMAL HIGH (ref 5–15)
BUN: 64 mg/dL — ABNORMAL HIGH (ref 8–23)
CO2: 22 mmol/L (ref 22–32)
Calcium: 8.5 mg/dL — ABNORMAL LOW (ref 8.9–10.3)
Chloride: 98 mmol/L (ref 98–111)
Creatinine, Ser: 7.31 mg/dL — ABNORMAL HIGH (ref 0.61–1.24)
GFR, Estimated: 8 mL/min — ABNORMAL LOW (ref >60)
Glucose, Bld: 123 mg/dL — ABNORMAL HIGH (ref 70–99)
Phosphorus: 8.9 mg/dL — ABNORMAL HIGH (ref 2.5–4.6)
Potassium: 4.3 mmol/L (ref 3.5–5.1)
Sodium: 137 mmol/L (ref 135–145)

## 2022-03-11 LAB — GLUCOSE, CAPILLARY
Glucose-Capillary: 111 mg/dL — ABNORMAL HIGH (ref 70–99)
Glucose-Capillary: 118 mg/dL — ABNORMAL HIGH (ref 70–99)
Glucose-Capillary: 125 mg/dL — ABNORMAL HIGH (ref 70–99)
Glucose-Capillary: 132 mg/dL — ABNORMAL HIGH (ref 70–99)
Glucose-Capillary: 136 mg/dL — ABNORMAL HIGH (ref 70–99)

## 2022-03-11 MED ORDER — VANCOMYCIN HCL IN DEXTROSE 1-5 GM/200ML-% IV SOLN
1000.0000 mg | INTRAVENOUS | Status: DC
Start: 2022-03-12 — End: 2022-03-16
  Administered 2022-03-12 – 2022-03-14 (×2): 1000 mg via INTRAVENOUS
  Filled 2022-03-11 (×4): qty 200

## 2022-03-11 MED ORDER — OXYCODONE HCL 5 MG PO TABS
5.0000 mg | ORAL_TABLET | Freq: Four times a day (QID) | ORAL | Status: DC | PRN
  Administered 2022-03-11: 5 mg via ORAL
  Filled 2022-03-11: qty 1

## 2022-03-11 NOTE — Progress Notes (Signed)
?[facility] ?Progress Note  ? ? ?Assessment/ Plan:   ?AKI: in the setting of severe septic shock likely ATN.  Normal kidney function baseline ?            - started CRRT 02/26/22 - 4/11 ? - tolerated dialysis without significant issues ? -Maintain TTS schedule for now; HD 4/15 (sat) w/ 2L net UF. bladder scan 4/18 am only 193m ?HD 4/18 net UF 2.3L but BP on lower side ? ?Still no signs of recovery; will try to limit the UF th and sat -> reassess early next week for any e/o renal recovery.  ?  ?2.  Septic shock ?            - severe initially w/ high pressors requirement now improved.  Has now completed antibiotics ?  ?3.  Scrotal Edema ?            - urology following.  Urology feels this is unlikely the source of his infection. Has improved. Continue w/ volume removal on HD ?  ?4.  Acute hypoxic RF ?            - Now extubated significantly improved.  Continue to monitor ? ?5.  Thrombocytopenia/ coagulopathy: Likely related cirrhosis and sepsis.  Now resolved ? ?6.  Anemia: Hemoglobin 7. 663m of darbo given on 4/15. Transfusions if needed ? ? ?7.  Afib with RVR- hep--> bivalirudin gtt -> now holding anticoagulation ? ?8.  Foot wounds: associated with high-dose vasopressor requirements. Vascular surgery consulted ? ?9. Hyperphosphatemia: started on sevelamer -> incr to 3 tabs TIDM ?  ?10. Dispo: Floor status.  May need to start process of arranging outpatient hemodialysis.  Rehabilitation will also be a factor ? ?Subjective:   ? ?Patient is in bed, still confused; spouse bedside. She believes he is a little more lucid today but still not much different from yesterday.  ?  ?  ? ?Objective:   ?BP 99/61 (BP Location: Right Arm)   Pulse (!) 104   Temp 98.3 ?F (36.8 ?C) (Oral)   Resp 20   Ht _0  (1.778 m)   Wt 136.1 kg   SpO2 98%   BMI 43.05 kg/m?  ? ?Intake/Output Summary (Last 24 hours) at 03/11/2022 0815 ?Last data filed at 03/11/2022 0500 ?Gross per 24 hour  ?Intake 1159.83 ml  ?Output 2332  ml  ?Net -1172.17 ml  ? ?Weight change: -6.804 kg ? ?Physical Exam: ?GEOMA:[id number], but in no distress ?HEENT eyes closed, no nasal discharge ?PULM bilateral chest rise with no increased work of breathing ?CV Normal rate, no audible rub ?ABD distended, soft, bowel sounds present ?GU: swollen scrotum but overall improved ?EXT LEE improved, mottled and cool bilateral soles of feet and toes, dry gangrene b/l worse on the right side ?ACCESS: RIJ TC ? ?Imaging: ?IR Fluoro Guide CV Line Right ? ?Result Date: 03/09/2022 ?INDICATION: AKI requiring HD initiation. EXAM: TUNNELED CENTRAL VENOUS HEMODIALYSIS CATHETER PLACEMENT WITH ULTRASOUND AND FLUOROSCOPIC GUIDANCE MEDICATIONS: Ancef 2 gm IV . The antibiotic was given in an appropriate time interval prior to skin puncture. ANESTHESIA/SEDATION: Moderate (conscious) sedation was employed during this procedure. A total of Versed 1 mg and Fentanyl 50 mcg was administered intravenously. Moderate Sedation Time: 12 minutes. The patient's level of consciousness and vital signs were monitored continuously by radiology nursing throughout the procedure under my direct supervision. FLUOROSCOPY TIME:  Fluoroscopic dose; 3 mGy COMPLICATIONS: None immediate. PROCEDURE: Informed written consent was obtained from the the patient  and/or patient's representative after a discussion of the risks, benefits, and alternatives to treatment. Questions regarding the procedure were encouraged and answered. The RIGHT neck and chest were prepped with chlorhexidine in a sterile fashion, and a sterile drape was applied covering the operative field. Maximum barrier sterile technique with sterile gowns and gloves were used for the procedure. A timeout was performed prior to the initiation of the procedure. After creating a small venotomy incision, a micropuncture kit was utilized to access the internal jugular vein. Real-time ultrasound guidance was utilized for vascular access including the  acquisition of a permanent ultrasound image documenting patency of the accessed vessel. The microwire was utilized to measure appropriate catheter length. A stiff Glidewire was advanced to the level of the IVC and the micropuncture sheath was exchanged for a peel-away sheath. A palindrome tunneled hemodialysis catheter measuring 20 cm from tip to cuff was tunneled in a retrograde fashion from the anterior chest wall to the venotomy incision. The catheter was then placed through the peel-away sheath with tips ultimately positioned within the superior aspect of the right atrium. Final catheter positioning was confirmed and documented with a spot radiographic image. The catheter aspirates and flushes normally. The catheter was flushed with appropriate volume heparin dwells. The catheter exit site was secured with a 0-Prolene retention suture. The venotomy incision was closed with Dermabond. Dressings were applied. The patient tolerated the procedure well without immediate post procedural complication. IMPRESSION: Successful placement of 23 cm tip to cuff tunneled hemodialysis catheter via the RIGHT internal jugular vein, as above The tip of the catheter is positioned within the proximal RIGHT atrium. The catheter is ready for immediate use. Michaelle Birks, MD [facility] Rad[facility] Electronically Signed   By: Michaelle Birks M.D.   On: 03/09/2022 09:38  ? ?IR US Guide Vasc Access Right ? ?Result Date: 03/09/2022 ?INDICATION: AKI requiring HD initiation. EXAM: TUNNELED CENTRAL VENOUS HEMODIALYSIS CATHETER PLACEMENT WITH ULTRASOUND AND FLUOROSCOPIC GUIDANCE MEDICATIONS: Ancef 2 gm IV . The antibiotic was given in an appropriate time interval prior to skin puncture. ANESTHESIA/SEDATION: Moderate (conscious) sedation was employed during this procedure. A total of Versed 1 mg and Fentanyl 50 mcg was administered intravenously. Moderate Sedation Time: 12 minutes. The patient's level  of consciousness and vital signs were monitored continuously by radiology nursing throughout the procedure under my direct supervision. FLUOROSCOPY TIME:  Fluoroscopic dose; 3 mGy COMPLICATIONS: None immediate. PROCEDURE: Informed written consent was obtained from the the patient and/or patient's representative after a discussion of the risks, benefits, and alternatives to treatment. Questions regarding the procedure were encouraged and answered. The RIGHT neck and chest were prepped with chlorhexidine in a sterile fashion, and a sterile drape was applied covering the operative field. Maximum barrier sterile technique with sterile gowns and gloves were used for the procedure. A timeout was performed prior to the initiation of the procedure. After creating a small venotomy incision, a micropuncture kit was utilized to access the internal jugular vein. Real-time ultrasound guidance was utilized for vascular access including the acquisition of a permanent ultrasound image documenting patency of the accessed vessel. The microwire was utilized to measure appropriate catheter length. A stiff Glidewire was advanced to the level of the IVC and the micropuncture sheath was exchanged for a peel-away sheath. A palindrome tunneled hemodialysis catheter measuring 20 cm from tip to cuff was tunneled in a retrograde fashion from the anterior chest wall to the venotomy incision. The catheter was then placed through the  peel-away sheath with tips ultimately positioned within the superior aspect of the right atrium. Final catheter positioning was confirmed and documented with a spot radiographic image. The catheter aspirates and flushes normally. The catheter was flushed with appropriate volume heparin dwells. The catheter exit site was secured with a 0-Prolene retention suture. The venotomy incision was closed with Dermabond. Dressings were applied. The patient tolerated the procedure well without immediate post procedural  complication. IMPRESSION: Successful placement of 23 cm tip to cuff tunneled hemodialysis catheter via the RIGHT internal jugular vein, as above The tip of the catheter is positioned within the proximal RIGHT atrium. The cath

## 2022-03-11 NOTE — Progress Notes (Signed)
Inpatient Rehab Admissions Coordinator:  ? ?Per therapy recommendations,  patient was screened for CIR candidacy by Clemens Catholic, MS, CCC-SLP  At this time, Pt. is not medically ready (on intermittent HD with plans for AKA vs BKA) and is not yet tolerating therapies at a level where I think he could tolerate the intensity of CIR. I will not pursue a rehab consult for this Pt. at this time, but CIR admissions team will follow and monitor for medical readiness  and participation and place consult order if Pt. appears to be an appropriate candidate. Please contact me with any questions.  ? ?Clemens Catholic, MS, CCC-SLP ?Rehab Admissions Coordinator  ?[phone] (celll) ?[phone] (office) ? ? ?

## 2022-03-11 NOTE — Progress Notes (Signed)
Physical Therapy Treatment ?Patient Details ?Name: Jeffrey Ayala ?MRN: [id number] ?DOB: 1960-12-20 ?Today's Date: 03/11/2022 ? ? ?History of Present Illness Pt adm 4/2 c/o N/V/D and found to have septic shock due to scrotal cellulitis vs RLL PNA.  Pt intubated 4/3 and brief cardiac arrest post intubation. [name] started 4/5.  Pt with AKI and started on CRRT on 4/6. Pt extubated 4/10. Pt also with afib with rvr. PMH - Bil TKR, migraines ? ?  ?PT Comments  ? ? Pt received in supine, more alert initially than previous date and answering some questions/following simple commands with increased time. Spouse present and supportive. Pt with waxing/waning alertness during session but more alert seated EOB which patient tolerated ~12 minutes sitting upright today and BP stable in supine/seated postures. Emphasis on midline seated posture/balance, neuromuscular facilitation and postural/tactile cues for improved muscular activation for completion of functional tasks. Co-treatment with OT due to pt multidisciplinary therapy needs. Pt continues to benefit from PT services to progress toward functional mobility goals.   ?Recommendations for follow up therapy are one component of a multi-disciplinary discharge planning process, led by the attending physician.  Recommendations may be updated based on patient status, additional functional criteria and insurance authorization. ? ?Follow Up Recommendations ? Acute inpatient rehab (3hours/day) ?  ?  ?Assistance Recommended at Discharge Frequent or constant Supervision/Assistance  ?Patient can return home with the following Two people to help with walking and/or transfers;Two people to help with bathing/dressing/bathroom;Help with stairs or ramp for entrance;Assist for transportation ?  ?Equipment Recommendations ? Wheelchair (measurements PT);Wheelchair cushion (measurements PT);Hospital bed;Other (comment) (hoyer lift)  ?  ?Recommendations for Other Services   ? ? ?   ?Precautions / Restrictions Precautions ?Precautions: Fall ?Precaution Comments: Enteric precs; feet and scrotum with gangrene and blisters, fecal tube, cortrak, monitor HR/BP ?Restrictions ?Weight Bearing Restrictions: No  ?  ? ?Mobility ? Bed Mobility ?Overal bed mobility: Needs Assistance ?Bed Mobility: Supine to Sit, Sit to Supine ?Rolling: Total assist, +2 for physical assistance ?  ?Supine to sit: Max assist, +2 for physical assistance, +2 for safety/equipment, HOB elevated ?Sit to supine: +2 for physical assistance, +2 for safety/equipment, HOB elevated, Total assist ?  ?General bed mobility comments: patient angled towards R side upon entry, overall requires max assist +2 to power up trunk and shift hips with poor intation and sequencing, attention to task.  Returned to bed with total assist +2 ?  ? ?Transfers ?   ?General transfer comment: attempted to have pt perform posterior scooting at EOB for safe repositioning of hips on air bed but pt unable to follow instructions, needing totalA with bed pad to perform any seated scooting ?  ? ?  ?Balance Overall balance assessment: Needs assistance ?Sitting-balance support: Bilateral upper extremity supported, Feet unsupported ?Sitting balance-Leahy Scale: Poor ?Sitting balance - Comments: ~12 mins with variable minA (briefly) to maxA with 0-2 UE support intermittently ?Postural control: Posterior lean, Right lateral lean ?  ?  ?Standing balance comment: pt unable ?  ?   ? ?  ?Cognition Arousal/Alertness:  (intermittently lethargic/alert ~every 3 minutes in regular cycles) ?Behavior During Therapy: Flat affect ?Overall Cognitive Status: Impaired/Different from baseline ?Area of Impairment: Attention, Memory, Following commands, Awareness, Problem solving, Orientation, Safety/judgement ?   ?Orientation Level: Disoriented to, Place, Situation, Time (pt unable to answer any orientation questions) ?Current Attention Level: Focused ?Memory: Decreased short-term memory,  Decreased recall of precautions ?Following Commands: Follows one step commands with increased time, Follows one step commands  inconsistently ?Safety/Judgement: Decreased awareness of safety, Decreased awareness of deficits ?Awareness: Intellectual ?Problem Solving: Slow processing, Decreased initiation, Difficulty sequencing, Requires verbal cues, Requires tactile cues ?General Comments: Patient's alertness fluctates during session, when alert patient able to answer questions and follow simple commands with multimodal cueing but fades quickly.  Spouse reports pt called her by name for the first time yesterday. Possibly L visual field deficit. Pt unable to state his birth month but was able to state his birth day. ?  ?  ? ?  ?Exercises Other Exercises ?Other Exercises: seated lateral/anterior reaching within BOS (attempt ~10 reps, pt able to follow instruction ~5 reps) ?Other Exercises: seated midline posture with vc/tcs for self-supporting (~10 mins total with intermittently more/less focus) ?Other Exercises: cervical extension x5 reps with AROM/verbal and tactile cues for forward gaze ? ?  ?General Comments General comments (skin integrity, edema, etc.):  ?BP 93/50 (62) supine HR 100;  ?BP 93/66 (75) seated EOB HR 85 bpm (variable HR);  ?BP 108/58 (68) HR 94 after return to supine ?  ?  ? ?Pertinent Vitals/Pain Pain Assessment ?Faces Pain Scale: Hurts even more ?Pain Location: LUE (?) pt unable to localize ?Pain Descriptors / Indicators: Discomfort, Grimacing  ? ? ? ?PT Goals (current goals can now be found in the care plan section) Acute Rehab PT Goals ?Patient Stated Goal: not stated ?PT Goal Formulation: With patient/family ?Time For Goal Achievement: 03/16/22 ?Progress towards PT goals: Progressing toward goals ? ?  ?Frequency ? ? ? Min 3X/week ? ? ? ?  ?PT Plan Current plan remains appropriate  ? ? ?Co-evaluation PT/OT/SLP Co-Evaluation/Treatment: Yes ?Reason for Co-Treatment: Complexity of the patient's  impairments (multi-system involvement);For patient/therapist safety;To address functional/ADL transfers;Necessary to address cognition/behavior during functional activity ?PT goals addressed during session: Mobility/safety with mobility;Balance ?OT goals addressed during session: ADL's and self-care ?  ? ?  ?AM-PAC PT "6 Clicks" Mobility   ?Outcome Measure ? Help needed turning from your back to your side while in a flat bed without using bedrails?: Total ?Help needed moving from lying on your back to sitting on the side of a flat bed without using bedrails?: Total ?Help needed moving to and from a bed to a chair (including a wheelchair)?: Total ?Help needed standing up from a chair using your arms (e.g., wheelchair or bedside chair)?: Total ?Help needed to walk in hospital room?: Total ?Help needed climbing 3-5 steps with a railing? : Total ?6 Click Score: 6 ? ?  ?End of Session   ?Activity Tolerance: Patient tolerated treatment well (improved tolerance/participation from previous date) ?Patient left: in bed;with call bell/phone within reach;with bed alarm set;Other (comment);with family/visitor present (prevalon boots donned and repositioned; pt positioned in high fowlers position in bed with pillows to prevent L lean and under B elbows to promote neutral seated posture) ?Nurse Communication: Mobility status;Need for lift equipment ?PT Visit Diagnosis: Other abnormalities of gait and mobility (R26.89);Muscle weakness (generalized) (M62.81) ?  ? ? ?Time: 1035-1110 ?PT Time Calculation (min) (ACUTE ONLY): 35 min ? ?Charges:  $Neuromuscular Re-education: 8-22 mins          ?          ? ?Bravlio Luca P., PTA ?Acute Rehabilitation Services ?Secure Chat Preferred 9a-5:30pm ?Office: [phone]  ? ? ?Yechiel Erny M Naaman Curro ?03/11/2022, 2:31 PM ? ?

## 2022-03-11 NOTE — Progress Notes (Signed)
Patient ID: Jeffrey Ayala, male   DOB: 1961/05/25, 61 y.o.   MRN: [id number] ? ?  ?Subjective: ?Pt without new complaints.  Denies pain of scrotum. ? ?Objective: ?Vital signs in last 24 hours: ?Temp:  [97.5 ?F (36.4 ?C)-102.9 ?F (39.4 ?C)] 98.3 ?F (36.8 ?C) (04/19 0749) ?Pulse Rate:  [99-118] 108 (04/19 0923) ?Resp:  [15-28] 20 (04/19 0430) ?BP: (94-152)/(52-104) 107/62 (04/19 6767) ?SpO2:  [92 %-100 %] 98 % (04/19 0430) ?Weight:  [136.1 kg] 136.1 kg (04/19 0500) ? ?Intake/Output from previous day: ?04/18 0701 - 04/19 0700 ?In: 1159.8 [NG/GT:1059.8; IV Piggyback:100] ?Out: 2332  ?Intake/Output this shift: ?No intake/output data recorded. ? ?Physical Exam:  ?General: Alert and oriented ?GU: Stable area of eschar, dry/wet gangrene of scrotum anteriorly.  Well demarcated without erythema, crepitus, evidence of abscess. ? ?Lab Results: ?Recent Labs  ?  03/09/22 ?2094 03/10/22 ?7096 03/11/22 ?0559  ?HGB 7.1* 7.1* 7.5*  ?HCT 22.3* 23.1* 24.1*  ? ? ?  Latest Ref Rng & Units 03/11/2022  ?  5:59 AM 03/10/2022  ?  3:37 AM 03/09/2022  ?  5:20 AM  ?CBC  ?WBC 4.0 - 10.5 K/uL 12.8   12.4   10.8    ?Hemoglobin 13.0 - 17.0 g/dL 7.5   7.1   7.1    ?Hematocrit 39.0 - 52.0 % 24.1   23.1   22.3    ?Platelets 150 - 400 K/uL 360   330   287    ? ? ? ?BMET ?Recent Labs  ?  03/10/22 ?2836 03/11/22 ?0559  ?NA 134* 137  ?K 5.2* 4.3  ?CL 97* 98  ?CO2 17* 22  ?GLUCOSE 129* 123*  ?BUN 102* 64*  ?CREATININE 11.60* 7.31*  ?CALCIUM 8.0* 8.5*  ? ? ? ?Studies/Results: ?DG Chest Port 1 View ? ?Result Date: 03/10/2022 ?CLINICAL DATA:  New onset of fever. EXAM: PORTABLE CHEST 1 VIEW COMPARISON:  Chest radiograph dated 02/26/2022. FINDINGS: Right-sided dialysis catheter with tip at the cavoatrial junction. Feeding tube extends below the diaphragm with tip beyond the inferior margin of the image. There is cardiomegaly with vascular congestion. No focal consolidation, pleural effusion, or pneumothorax. No acute osseous pathology. IMPRESSION: Cardiomegaly  with vascular congestion. No focal consolidation. Electronically Signed   By: Anner Crete M.D.   On: 03/10/2022 19:21  ? ?DG Abd Portable 1V ? ?Result Date: 03/09/2022 ?CLINICAL DATA:  Feeding tube placement. EXAM: PORTABLE ABDOMEN - 1 VIEW COMPARISON:  None. FINDINGS: Feeding tube tip is approaching the pylorus. IMPRESSION: Feeding tube tip is approaching the pylorus. Electronically Signed   By: Lorin Picket M.D.   On: 03/09/2022 15:49   ? ?Assessment/Plan: ?1) Dry gangrene of the scrotum: Talked with patient's wife.  Will definitely benefit from debridement of scrotum to facilitate wound healing at time of vascular surgery procedure.  Will coordinate with Dr. Scot Dock or [name].  Patient's wife understands and agrees to proceed when appropriate. ? ? LOS: 17 days  ? ?[name] ?03/11/2022, 2:56 PM ? ?  ?

## 2022-03-11 NOTE — Progress Notes (Addendum)
? ?  VASCULAR SURGERY ASSESSMENT & PLAN:  ? ?GANGRENE OF BOTH FEET: His feet are demarcating.  Clearly the right foot is not salvageable.  He will require an AKA or BKA on the right.  On the left side I have asked [name] to see the patient to see if he might be a candidate for a [facility] amputation.  This is a complicated situation and that honestly I think there is only a small chance that he would be ambulatory with bilateral prostheses. ? ?In addition, Dr. Alinda Money would like to ultimately debride his scrotum and coordinate any surgery with our schedule. ? ?I did speak to his wife over the phone this morning and she is understanding that he will likely require bilateral amputations.  I discussed doing them above-the-knee given that the likelihood of him walking with bilateral prostheses is quite small. ? ? ?SUBJECTIVE:  ? ?Still fairly confused. ? ?PHYSICAL EXAM:  ? ?Vitals:  ? 03/11/22 0424 03/11/22 0430 03/11/22 0500 03/11/22 0749  ?BP: 106/62 (!) 110/57  99/61  ?Pulse: (!) 118 (!) 104    ?Resp: (!) 28 20    ?Temp: 98.4 ?F (36.9 ?C) 98.2 ?F (36.8 ?C)  98.3 ?F (36.8 ?C)  ?TempSrc: Oral Oral  Oral  ?SpO2: 98% 98%    ?Weight:   136.1 kg   ?Height:      ? ?Both feet are demarcating. ? ? ? ? ? ?LABS:  ? ?Lab Results  ?Component Value Date  ? WBC 12.8 (H) 03/11/2022  ? HGB 7.5 (L) 03/11/2022  ? HCT 24.1 (L) 03/11/2022  ? MCV 94.5 03/11/2022  ? PLT 360 03/11/2022  ? ?Lab Results  ?Component Value Date  ? CREATININE 7.31 (H) 03/11/2022  ? ?Lab Results  ?Component Value Date  ? INR 1.3 (H) 02/26/2022  ? ?CBG (last 3)  ?Recent Labs  ?  03/10/22 ?2113 03/11/22 ?7096 03/11/22 ?0429  ?GLUCAP 128* 125* 136*  ? ? ?PROBLEM LIST:   ? ?Principal Problem: ?  Septic shock ([place]) ?Active Problems: ?  Pressure injury of skin ?  Encounter for central line placement ?  Malnutrition of moderate degree ? ? ?CURRENT MEDS:  ? ? aspirin EC  81 mg Oral Daily  ? chlorhexidine gluconate (MEDLINE KIT)  15 mL Mouth Rinse BID  ? Chlorhexidine  Gluconate Cloth  6 each Topical Q0600  ? darbepoetin (ARANESP) injection - DIALYSIS  60 mcg Intravenous Q Sat-HD  ? dronabinol  2.5 mg Oral QAC lunch  ? feeding supplement  237 mL Oral QID  ? feeding supplement (PROSource TF)  45 mL Per Tube QID  ? folic acid  1 mg Oral Daily  ? guaiFENesin  600 mg Oral BID  ? heparin injection (subcutaneous)  5,000 Units Subcutaneous Q8H  ? insulin aspart  0-15 Units Subcutaneous Q4H  ? leptospermum manuka honey  1 application. Topical Daily  ? mouth rinse  15 mL Mouth Rinse BID  ? melatonin  3 mg Oral QHS  ? metoprolol tartrate  37.5 mg Oral TID  ? multivitamin  1 tablet Oral QHS  ? sevelamer carbonate  2,400 mg Oral TID WC  ? sodium chloride flush  10-40 mL Intracatheter Q12H  ? ? ?Jeffrey Ayala ?Office: [phone] ?03/11/2022 ? ?

## 2022-03-11 NOTE — Progress Notes (Signed)
? ?Progress Note ? ?Patient Name: Jeffrey Ayala ?Date of Encounter: 03/11/2022 ? ?[facility] Cardiologist: Candee Furbish, MD  ? ?Subjective  ? ?Laying comfortably in bed.  No chest pain ? ?Inpatient Medications  ?  ?Scheduled Meds: ? aspirin EC  81 mg Oral Daily  ? chlorhexidine gluconate (MEDLINE KIT)  15 mL Mouth Rinse BID  ? Chlorhexidine Gluconate Cloth  6 each Topical Q0600  ? darbepoetin (ARANESP) injection - DIALYSIS  60 mcg Intravenous Q Sat-HD  ? dronabinol  2.5 mg Oral QAC lunch  ? feeding supplement  237 mL Oral QID  ? feeding supplement (PROSource TF)  45 mL Per Tube QID  ? folic acid  1 mg Oral Daily  ? guaiFENesin  600 mg Oral BID  ? heparin injection (subcutaneous)  5,000 Units Subcutaneous Q8H  ? insulin aspart  0-15 Units Subcutaneous Q4H  ? leptospermum manuka honey  1 application. Topical Daily  ? mouth rinse  15 mL Mouth Rinse BID  ? melatonin  3 mg Oral QHS  ? metoprolol tartrate  37.5 mg Oral TID  ? multivitamin  1 tablet Oral QHS  ? sevelamer carbonate  2,400 mg Oral TID WC  ? sodium chloride flush  10-40 mL Intracatheter Q12H  ? ?Continuous Infusions: ? sodium chloride Stopped (03/06/22 2355)  ? ceFEPime (MAXIPIME) IV 1 g (03/10/22 2015)  ? feeding supplement (NEPRO CARB STEADY) 1,000 mL (03/10/22 2301)  ? [START ON 03/12/2022] vancomycin    ? ?PRN Meds: ?acetaminophen, docusate sodium, heparin sodium (porcine), metoprolol tartrate, phenol, polyethylene glycol, sodium chloride flush, white petrolatum  ? ?Vital Signs  ?  ?Vitals:  ? 03/11/22 0430 03/11/22 0500 03/11/22 0749 03/11/22 0923  ?BP: (!) 110/57  99/61 107/62  ?Pulse: (!) 104   (!) 108  ?Resp: 20     ?Temp: 98.2 ?F (36.8 ?C)  98.3 ?F (36.8 ?C)   ?TempSrc: Oral  Oral   ?SpO2: 98%     ?Weight:  136.1 kg    ?Height:      ? ? ?Intake/Output Summary (Last 24 hours) at 03/11/2022 1202 ?Last data filed at 03/11/2022 0500 ?Gross per 24 hour  ?Intake 1159.83 ml  ?Output 0 ml  ?Net 1159.83 ml  ? ? ?  03/11/2022  ?  5:00 AM 03/10/2022  ? 10:45  AM 03/10/2022  ?  6:30 AM  ?Last 3 Weights  ?Weight (lbs) 300 lb 293 lb 307 lb 15.7 oz  ?Weight (kg) 136.079 kg 132.904 kg 139.7 kg  ?   ? ?Telemetry  ?  ?A-fib 100-120- Personally Reviewed ? ?ECG  ?  ?No new- Personally Reviewed ? ?Physical Exam  ? ?GEN: No acute distress.   ?Neck: No JVD ?Cardiac: Irregularly irregular, no murmurs, rubs, or gallops.  ?Respiratory: Clear to auscultation bilaterally. ?GI: Soft, nontender, non-distended  ?MS: No edema; No deformity. ?Neuro:  Nonfocal  ?Psych: Normal affect  ? ?Labs  ?  ?High Sensitivity Troponin:   ?Recent Labs  ?Lab 02/22/22 ?1805 02/22/22 ?2007 02/23/22 ?2951 02/23/22 ?1208  ?TROPONINIHS 525* 1,834* 2,807* 3,550*  ?   ?Chemistry ?Recent Labs  ?Lab 03/06/22 ?0530 03/07/22 ?0500 03/08/22 ?0530 03/09/22 ?8841 03/10/22 ?6606 03/11/22 ?0559  ?NA 133* 136 134* 136 134* 137  ?K 3.9 4.1 4.2 4.4 5.2* 4.3  ?CL 98 100 98 100 97* 98  ?CO2 24 21* 22 18* 17* 22  ?GLUCOSE 123* 120* 108* 110* 129* 123*  ?BUN 47* 75* 55* 79* 102* 64*  ?CREATININE 4.68* 7.12* 6.49* 9.04*  11.60* 7.31*  ?CALCIUM 8.2* 8.0* 8.0* 8.3* 8.0* 8.5*  ?MG 2.6* 2.7* 2.5*  --   --   --   ?PROT  --  6.1*  --   --  6.9  --   ?ALBUMIN 2.3* 2.2*  2.2* 2.2* 2.3* 2.3* 2.3*  ?AST  --  46*  --   --  29  --   ?ALT  --  78*  --   --  41  --   ?ALKPHOS  --  266*  --   --  183*  --   ?BILITOT  --  3.4*  --   --  2.9*  --   ?GFRNONAA 14* 8* 9* 6* 5* 8*  ?ANIONGAP _0 18* 20* 17*  ?  ?Lipids No results for input(s): CHOL, TRIG, HDL, LABVLDL, LDLCALC, CHOLHDL in the last 168 hours.  ?Hematology ?Recent Labs  ?Lab 03/09/22 ?7680 03/10/22 ?8811 03/11/22 ?0559  ?WBC 10.8* 12.4* 12.8*  ?RBC 2.41* 2.46* 2.55*  ?HGB 7.1* 7.1* 7.5*  ?HCT 22.3* 23.1* 24.1*  ?MCV 92.5 93.9 94.5  ?MCH 29.5 28.9 29.4  ?MCHC 31.8 30.7 31.1  ?RDW 16.6* 16.4* 16.3*  ?PLT 287 330 360  ? ?Thyroid No results for input(s): TSH, FREET4 in the last 168 hours.  ?BNPNo results for input(s): BNP, PROBNP in the last 168 hours.  ?DDimer No results for input(s):  DDIMER in the last 168 hours.  ? ?Radiology  ?  ?DG Chest Port 1 View ? ?Result Date: 03/10/2022 ?CLINICAL DATA:  New onset of fever. EXAM: PORTABLE CHEST 1 VIEW COMPARISON:  Chest radiograph dated 02/26/2022. FINDINGS: Right-sided dialysis catheter with tip at the cavoatrial junction. Feeding tube extends below the diaphragm with tip beyond the inferior margin of the image. There is cardiomegaly with vascular congestion. No focal consolidation, pleural effusion, or pneumothorax. No acute osseous pathology. IMPRESSION: Cardiomegaly with vascular congestion. No focal consolidation. Electronically Signed   By: Anner Crete M.D.   On: 03/10/2022 19:21  ? ?DG Abd Portable 1V ? ?Result Date: 03/09/2022 ?CLINICAL DATA:  Feeding tube placement. EXAM: PORTABLE ABDOMEN - 1 VIEW COMPARISON:  None. FINDINGS: Feeding tube tip is approaching the pylorus. IMPRESSION: Feeding tube tip is approaching the pylorus. Electronically Signed   By: Lorin Picket M.D.   On: 03/09/2022 15:49   ? ? ? ?Patient Profile  ?   ?61 y.o. male with history of gastric ulcers, eosinophilic esphagitis, arthritis and migraines who presented with septic shock with concern for scrotal cellulitis and possible aspiration pneumonia with course complicated by acute respiratory failure, brief cardiac arrest post-intubation, AKI requiring CRRT>iHD, acute metabolic encephalopathy, bilateral foot wounds, and Afib with RVR for which Cardiology has been consulted.  ? ?Assessment & Plan  ?  ?New Onset Atrial Fibrillation ?- CHADs-vasc 0-1 (coronary Ca on CT).AFib in the setting of septic shock. Echo showed normal LV function. Initially placed on heparin gtt but this was subsequently stopped due to drop in Hgb to 7. Also has history of gastric ulcers.  ?- Plan for rate control. HR  in 100s on metoprolol 37.5 mg TID. HT in 120-130s during dialysis. BP would not tolerate further titration  ?-If hemoglobin stabilizes without any signs of bleeding, could consider  Eliquis at discharge but I would be very careful with this. ?-Possible plan for outpatient DCCV if remains in Afib and able to tolerate AC ?  ?2. Elevated Troponin ?3. Cardiac Arrest ?Patient had a brief cardiac arrest post-intubation on 4/3. Per note  from 4/3, he became pulses a few minutes after intubation and was resuscitated with 1 amp of epi and 1 amp of bicarb. High-sensitivity troponin elevated at 525 >> 1,834 >> 2,807 >> 3,550. Echo showed LVEF of 60-65% with normal wall motion. RV normal. Suspect demand in the setting of severe sepsis and respiratory arrest.  ?-No plans for ischemic testing at this time ?  ?Otherwise per primary team  ? ? ?For questions or updates, please contact Drew ?Please consult [email or web address] for contact info under  ? ?  ?   ?Signed, ?Candee Furbish, MD  ?03/11/2022, 12:02 PM   ? ?

## 2022-03-11 NOTE — Progress Notes (Signed)
?PROGRESS NOTE ? ?Jeffrey Ayala  ?DOB: [birth year]  ?PCP: Patient, No Pcp Per (Inactive) ?BWL:[id number]  ?[name]: 02/22/2022 ? LOS: 17 days  ?Hospital Day: 18 ? ?Brief narrative: ?Jeffrey Ayala is a 61 y.o. male with PMH significant for arthritis, bilateral knee replacement, gastric ulcer, kidney stones, eosinophilic esophagitis, migraines not on any regular meds.   ?Patient presented to the ED on 4/2 with complaint of nausea, vomiting, diarrhea and confusion for 24 hours.   ?EMS noted him to be in A-fib with a heart rate of 120-160 bpm. ?In the ED, he had a fever of 102.9. ?CT abdomen pelvis showed swollen scrotum concerning for infection, colon filled with diarrhea.  ?He was hypotensive requiring multiple vasopressors in the ICU.   ?4/3, patient required intubation and also had a brief cardiac arrest postintubation.   ?4/5, in an effort to correct vasoplegia, patient was also started on [name] ?Because of septic shock, patient's renal function worsened. ?4/6, patient was started on CRRT ?4/11, patient was extubated. ?4/14, patient was transferred out to [facility] service. ? ?Subjective: ?Patient was seen and examined this morning. ?Lying on bed.  A little more awake than yesterday but not able to have a meaningful conversation.  Spontaneously moving extremities.  Wife at bedside. ?Has NG tube in place. ?Yesterday afternoon, he had 2 episodes of fever up to 102.4.  Sepsis work-up was started. ? ?Principal Problem: ?  Septic shock ([place]) ?Active Problems: ?  Pressure injury of skin ?  Encounter for central line placement ?  Malnutrition of moderate degree ?  ?Assessment and Plan: ?Septic shock - POA ?-Patient was in septic shock on admission, thought due to scrotal cellulitis and possible aspiration pneumonia from n/v ?-While in ICU, patient required multiple vasopressors including Levophed, epinephrine and vasopressin.  Also given a trial of methylene blue.  Eventually taken off pressors. ?-Blood culture  did not show any growth.  Urine culture with multiple species. ?-He was seen by urologist.  No intervention was recommended.  Initially treated with broad-spectrum antibiotic coverage.  To complete course of IV Ancef today. ?-Urology follow-up appreciated. ? ?New SIRS ?-4/18, patient spiked a fever of 102.4 with tachycardia.  Lactic acid level was normal. ?-Unclear cause of fever but high suspicion of an infectious etiology. ?-Blood culture sent again.  Chest x-ray not remarkable for pneumonia.  Urinalysis sent. ?-Started on broad-spectrum antibiotics with IV cefepime and vancomycin again. ?-No recurrence of fever last night.  Continue to monitor. ?Recent Labs  ?Lab 03/06/22 ?0530 03/07/22 ?0500 03/09/22 ?6811 03/10/22 ?5726 03/10/22 ?1808 03/11/22 ?0559  ?WBC 10.7* 10.2 10.8* 12.4*  --  12.8*  ?LATICACIDVEN  --   --   --   --  1.3  --   ? ?Diarrhea ?-Per wife, patient continues to have diarrhea since admission.  Currently has a Flexi-Seal in place. ?-/17, GI pathogen panel negative. ?-Stool output being monitored.  60 ml liquid stool in last 24 hours.  Continue to monitor. ?   ?Acute metabolic encephalopathy ?-Patient continues to have altered mental status, drowsiness, excessive sleepiness although with intermittent slight improvement.  Multifactorial: Sepsis, uremia, possible hypoxemic injury during septic shock.  ?-No evidence of intracranial infection. 4/15, CT head unremarkable. ?-Ammonia level unremarkable. ?-Continue to monitor mental status change ?  ?AKI from septic shock -new dialysis dependence ?-Started CRRT 4/64/11, now on iHD ?-4/17, underwent TLC catheter placed ?-Nephrology following.  No urine output ? ?Dry gangrene of both feet ?-Secondary to vasospasm due to high-dose of  vasopressors required for septic shock. ?-Vascular surgery following. ?-His wounds in both feet are continuing to demarcate.  Patient may ultimately require bilateral AKA  ? ?Acute anemia ?-Baseline hemoglobin normal.  Gradually  downtrending hemoglobin level this hospitalization.  Currently remains between 7 and 8.  Patient did not require any PRBC transfusion this hospitalization. ?Recent Labs  ?  03/06/22 ?0530 03/07/22 ?0500 03/09/22 ?6073 03/10/22 ?7106 03/11/22 ?0559  ?HGB 7.9* 7.0* 7.1* 7.1* 7.5*  ?MCV 93.6 94.1 92.5 93.9 94.5  ? ?New onset of afib, presents on admission ?-echocardiogram with preserved lvef ?-remains in rate controlled afib ?-Was on heparin drip initially but it was stopped because of drop in hemoglobin. ?-Cardiology consulted. Currently on metoprolol 37.5 mg 3 times daily and aspirin 81 mg daily. ?-If hemoglobin improves, will consider Eliquis at discharge. ?-Per cardiology, plan for outpatient DCCV if remains in A-fib and able to tolerate AC. ? ?Shock liver ?-Liver enzymes elevated likely due to shock.  Improving numbers.   ?-CT abd did not show hepatobiliary abnormality ?Recent Labs  ?Lab 03/07/22 ?0500 03/08/22 ?0530 03/09/22 ?2694 03/10/22 ?8546 03/11/22 ?0559  ?AST 46*  --   --  29  --   ?ALT 78*  --   --  41  --   ?ALKPHOS 266*  --   --  183*  --   ?BILITOT 3.4*  --   --  2.9*  --   ?PROT 6.1*  --   --  6.9  --   ?ALBUMIN 2.2*  2.2* 2.2* 2.3* 2.3* 2.3*  ? ?Acute hypoxic respiratory failure ?-Initially required intubation, later extubated.  Currently on room air. ? ?Elevated troponin ?-Likely because of brief cardiac arrest and septic shock. ?-Echo showed EF of 60 to 65% with normal wall motion. ?-No further ischemic evaluation at this time per cardiology. ? ?Morbid obesity  ?-Body mass index is 43.05 kg/m?Marland Kitchen Patient has been advised to make an attempt to improve diet and exercise patterns to aid in weight loss. ? ?Poor oral intake ?-Due to altered mentation.  Dietitian consult appreciated.  Currently getting tube feeding through core track.. ? ?Goals of care ?  Code Status: Full Code  ? ? ?Mobility: Encourage PT participation once mental status improves ? ?Skin assessment:  ?Pressure Injury 02/22/22 Coccyx  Medial Stage 2 -  Partial thickness loss of dermis presenting as a shallow open injury with a red, pink wound bed without slough. pink, painful (Active)  ?02/22/22 2330  ?Location: Coccyx  ?Location Orientation: Medial  ?Staging: Stage 2 -  Partial thickness loss of dermis presenting as a shallow open injury with a red, pink wound bed without slough.  ?Wound Description (Comments): pink, painful  ?Present on Admission: Yes  ? ? ?Nutritional status:  ?Body mass index is 43.05 kg/m?Marland Kitchen  ?Nutrition Problem: Moderate Malnutrition ?Etiology: acute illness (septic shock, A fib, AKI) ?Signs/Symptoms: mild fat depletion, mild muscle depletion, moderate muscle depletion ? ? ? ? ?Diet:  ?Diet Order   ? ?       ?  Diet regular Room service appropriate? Yes; Fluid consistency: Thin  Diet effective now       ?  ? ?  ?  ? ?  ? ? ?DVT prophylaxis:  ?heparin injection 5,000 Units Start: 03/10/22 0600 ?SCDs Start: 02/22/22 2249 ?  ?Antimicrobials: IV cefepime and vancomycin ?Fluid: None ?Consultants: Nephrology, cardiology ?Family Communication: Wife at bedside ? ?Status is: Inpatient ? ?Continue in-hospital care because: Continues to have multiple active medical issues ?Level of  care: Progressive  ? ?Dispo: The patient is from: Home ?             Anticipated d/c is to: Likely SNF ?             Patient currently is not medically stable to d/c. ?  Difficult to place patient No ? ? ? ? ?Infusions:  ? sodium chloride Stopped (03/06/22 2355)  ? ceFEPime (MAXIPIME) IV 1 g (03/10/22 2015)  ? feeding supplement (NEPRO CARB STEADY) 1,000 mL (03/10/22 2301)  ? [START ON 03/12/2022] vancomycin    ? ? ?Scheduled Meds: ? aspirin EC  81 mg Oral Daily  ? chlorhexidine gluconate (MEDLINE KIT)  15 mL Mouth Rinse BID  ? Chlorhexidine Gluconate Cloth  6 each Topical Q0600  ? darbepoetin (ARANESP) injection - DIALYSIS  60 mcg Intravenous Q Sat-HD  ? dronabinol  2.5 mg Oral QAC lunch  ? feeding supplement  237 mL Oral QID  ? feeding supplement (PROSource  TF)  45 mL Per Tube QID  ? folic acid  1 mg Oral Daily  ? guaiFENesin  600 mg Oral BID  ? heparin injection (subcutaneous)  5,000 Units Subcutaneous Q8H  ? insulin aspart  0-15 Units Subcutaneous Q4H  ? le

## 2022-03-11 NOTE — Progress Notes (Signed)
Occupational Therapy Treatment ?Patient Details ?Name: Jeffrey Ayala ?MRN: [id number] ?DOB: 1961-04-18 ?Today's Date: 03/11/2022 ? ? ?History of present illness Pt adm 4/2 c/o N/V/D and found to have septic shock due to scrotal cellulitis vs RLL PNA.  Pt intubated 4/3 and brief cardiac arrest post intubation. [name] started 4/5.  Pt with AKI and started on CRRT on 4/6. Pt extubated 4/10. Pt also with afib with rvr. PMH - Bil TKR, migraines ?  ?OT comments ? Pt engaged in OT/PT session, transitioning to EOB with max assist +2.  Patient remains limited by weakness, fluctuating lethargy, cognition, activity tolerance and impaired balance. Patient currently requires max assist for grooming, total assist +2 for LB dressing.  He requires constant cueing to attend to task, following some simple commands with multimodal cueing, but disoriented to time and situation (reports february). Pt spouse supportive, reports speaking to MD today about plan for Les.  Plan to assess vision further next session. Will follow acutely.   ? ?Recommendations for follow up therapy are one component of a multi-disciplinary discharge planning process, led by the attending physician.  Recommendations may be updated based on patient status, additional functional criteria and insurance authorization. ?   ?Follow Up Recommendations ? Acute inpatient rehab (3hours/day)  ?  ?Assistance Recommended at Discharge Frequent or constant Supervision/Assistance  ?Patient can return home with the following ? Two people to help with walking and/or transfers;Two people to help with bathing/dressing/bathroom;Assistance with cooking/housework;Assistance with feeding;Direct supervision/assist for medications management;Direct supervision/assist for financial management;Assist for transportation;Help with stairs or ramp for entrance ?  ?Equipment Recommendations ? Other (comment) (defer)  ?  ?Recommendations for Other Services Rehab consult ? ?  ?Precautions  / Restrictions Precautions ?Precautions: Fall ?Precaution Comments: Enteric precs; feet and scrotum with gangrene and blisters, fecal tube, cortrak, monitor HR/BP ?Restrictions ?Weight Bearing Restrictions: No  ? ? ?  ? ?Mobility Bed Mobility ?Overal bed mobility: Needs Assistance ?Bed Mobility: Supine to Sit, Sit to Supine ?  ?  ?Supine to sit: Max assist, +2 for physical assistance, +2 for safety/equipment, HOB elevated ?Sit to supine: Total assist, +2 for physical assistance, +2 for safety/equipment, HOB elevated ?  ?General bed mobility comments: patient angled towards R side upon entry, overall requires max assist +2 to power up trunk and shift hips with poor intation and sequencing, attention to task.  Returned to bed with total assist +2 ?  ? ?Transfers ?  ?  ?  ?  ?  ?  ?  ?  ?  ?  ?  ?  ?Balance Overall balance assessment: Needs assistance ?Sitting-balance support: Bilateral upper extremity supported, Feet unsupported, Single extremity supported ?Sitting balance-Leahy Scale: Poor ?Sitting balance - Comments: at best min assist EOB with 1-2 hand support, at times posterior and R lateral lean ?Postural control: Posterior lean, Right lateral lean ?  ?  ?  ?  ?  ?  ?  ?  ?  ?  ?  ?  ?  ?  ?   ? ?ADL either performed or assessed with clinical judgement  ? ?ADL Overall ADL's : Needs assistance/impaired ?  ?  ?Grooming: Maximal assistance;Sitting ?Grooming Details (indicate cue type and reason): +2 EOB, max assist HOH with L hand to wipe eyes ?Upper Body Bathing: Total assistance;Sitting ?  ?  ?  ?  ?  ?Lower Body Dressing: Total assistance;+2 for physical assistance;+2 for safety/equipment;Sitting/lateral leans;Bed level ?  ?  ?  ?  ?  ?  ?  ?  Functional mobility during ADLs: +2 for physical assistance;+2 for safety/equipment;Total assistance;Maximal assistance ?  ?  ? ?Extremity/Trunk Assessment   ?  ?  ?  ?  ?  ? ?Vision   ?  ?  ?Perception   ?  ?Praxis   ?  ? ?Cognition Arousal/Alertness: Lethargic ?Behavior  During Therapy: Flat affect ?Overall Cognitive Status: Impaired/Different from baseline ?Area of Impairment: Attention, Memory, Following commands, Awareness, Problem solving, Orientation, Safety/judgement ?  ?  ?  ?  ?  ?  ?  ?  ?Orientation Level: Disoriented to, Time, Situation, Place ?Current Attention Level: Focused ?Memory: Decreased recall of precautions, Decreased short-term memory ?Following Commands: Follows one step commands inconsistently, Follows one step commands with increased time ?Safety/Judgement: Decreased awareness of deficits, Decreased awareness of safety ?Awareness: Intellectual ?Problem Solving: Slow processing, Decreased initiation, Difficulty sequencing, Requires verbal cues, Requires tactile cues ?General Comments: patients alertness fluctates during session, when alert patient able to answer questions and follow simple commands with multimodal cueing but fades quickly.  Spouse reports pt called her by name for the first time yesterday. ?  ?  ?   ?Exercises   ? ?  ?Shoulder Instructions   ? ? ?  ?General Comments BP soft but stable 90s/60s  ? ? ?Pertinent Vitals/ Pain       Pain Assessment ?Pain Assessment: Faces ?Faces Pain Scale: Hurts even more ?Pain Location: generalized ?Pain Descriptors / Indicators: Discomfort, Grimacing ?Pain Intervention(s): Monitored during session, Repositioned ? ?Home Living   ?  ?  ?  ?  ?  ?  ?  ?  ?  ?  ?  ?  ?  ?  ?  ?  ?  ?  ? ?  ?Prior Functioning/Environment    ?  ?  ?  ?   ? ?Frequency ? Min 2X/week  ? ? ? ? ?  ?Progress Toward Goals ? ?OT Goals(current goals can now be found in the care plan section) ? Progress towards OT goals: Progressing toward goals ? ?Acute Rehab OT Goals ?Time For Goal Achievement: 03/17/22 ?Potential to Achieve Goals: Good  ?Plan Discharge plan remains appropriate;Frequency remains appropriate   ? ?Co-evaluation ? ? ? PT/OT/SLP Co-Evaluation/Treatment: Yes ?Reason for Co-Treatment: Complexity of the patient's impairments  (multi-system involvement);For patient/therapist safety;To address functional/ADL transfers;Necessary to address cognition/behavior during functional activity ?PT goals addressed during session: Mobility/safety with mobility;Balance ?OT goals addressed during session: ADL's and self-care ?  ? ?  ?AM-PAC OT "6 Clicks" Daily Activity     ?Outcome Measure ? ? Help from another person eating meals?: Total ?Help from another person taking care of personal grooming?: A Lot ?Help from another person toileting, which includes using toliet, bedpan, or urinal?: Total ?Help from another person bathing (including washing, rinsing, drying)?: Total ?Help from another person to put on and taking off regular upper body clothing?: Total ?Help from another person to put on and taking off regular lower body clothing?: Total ?6 Click Score: 7 ? ?  ?End of Session   ? ?OT Visit Diagnosis: Other abnormalities of gait and mobility (R26.89);Muscle weakness (generalized) (M62.81) ?  ?Activity Tolerance Patient limited by fatigue ?  ?Patient Left in bed;with call bell/phone within reach;with family/visitor present;with bed alarm set ?  ?Nurse Communication Mobility status ?  ? ?   ? ?Time: 1035-1110 ?OT Time Calculation (min): 35 min ? ?Charges: OT General Charges ?$OT Visit: 1 Visit ?OT Treatments ?$Self Care/Home Management : 8-22 mins ? ?Jolaine Artist, OT ?  Acute Rehabilitation Services ?Pager [phone] ?Office [phone] ? ? ?Delight Stare ?03/11/2022, 12:20 PM ?

## 2022-03-12 DIAGNOSIS — A419 Sepsis, unspecified organism: Secondary | ICD-10-CM | POA: Diagnosis not present

## 2022-03-12 DIAGNOSIS — I96 Gangrene, not elsewhere classified: Secondary | ICD-10-CM

## 2022-03-12 DIAGNOSIS — R4182 Altered mental status, unspecified: Secondary | ICD-10-CM | POA: Diagnosis not present

## 2022-03-12 DIAGNOSIS — E440 Moderate protein-calorie malnutrition: Secondary | ICD-10-CM | POA: Insufficient documentation

## 2022-03-12 DIAGNOSIS — E43 Unspecified severe protein-calorie malnutrition: Secondary | ICD-10-CM

## 2022-03-12 DIAGNOSIS — R6521 Severe sepsis with septic shock: Secondary | ICD-10-CM | POA: Diagnosis not present

## 2022-03-12 LAB — RENAL FUNCTION PANEL
Albumin: 2.2 g/dL — ABNORMAL LOW (ref 3.5–5.0)
Anion gap: 19 — ABNORMAL HIGH (ref 5–15)
BUN: 92 mg/dL — ABNORMAL HIGH (ref 8–23)
CO2: 20 mmol/L — ABNORMAL LOW (ref 22–32)
Calcium: 8.4 mg/dL — ABNORMAL LOW (ref 8.9–10.3)
Chloride: 97 mmol/L — ABNORMAL LOW (ref 98–111)
Creatinine, Ser: 9.44 mg/dL — ABNORMAL HIGH (ref 0.61–1.24)
GFR, Estimated: 6 mL/min — ABNORMAL LOW (ref >60)
Glucose, Bld: 131 mg/dL — ABNORMAL HIGH (ref 70–99)
Phosphorus: 10.6 mg/dL — ABNORMAL HIGH (ref 2.5–4.6)
Potassium: 4.3 mmol/L (ref 3.5–5.1)
Sodium: 136 mmol/L (ref 135–145)

## 2022-03-12 LAB — GLUCOSE, CAPILLARY
Glucose-Capillary: 118 mg/dL — ABNORMAL HIGH (ref 70–99)
Glucose-Capillary: 120 mg/dL — ABNORMAL HIGH (ref 70–99)
Glucose-Capillary: 123 mg/dL — ABNORMAL HIGH (ref 70–99)
Glucose-Capillary: 134 mg/dL — ABNORMAL HIGH (ref 70–99)
Glucose-Capillary: 144 mg/dL — ABNORMAL HIGH (ref 70–99)
Glucose-Capillary: 146 mg/dL — ABNORMAL HIGH (ref 70–99)

## 2022-03-12 MED ORDER — LOPERAMIDE HCL 1 MG/7.5ML PO SUSP
1.0000 mg | Freq: Two times a day (BID) | ORAL | Status: AC
  Administered 2022-03-12 – 2022-03-14 (×6): 1 mg via ORAL
  Filled 2022-03-12 (×6): qty 7.5

## 2022-03-12 MED ORDER — QUETIAPINE FUMARATE 25 MG PO TABS
25.0000 mg | ORAL_TABLET | Freq: Two times a day (BID) | ORAL | Status: AC
  Administered 2022-03-12 (×2): 25 mg via ORAL
  Filled 2022-03-12 (×2): qty 1

## 2022-03-12 MED ORDER — PANTOPRAZOLE SODIUM 40 MG PO TBEC
40.0000 mg | DELAYED_RELEASE_TABLET | Freq: Two times a day (BID) | ORAL | Status: DC
  Administered 2022-03-12 – 2022-03-13 (×4): 40 mg via ORAL
  Filled 2022-03-12 (×4): qty 1

## 2022-03-12 MED ORDER — HEPARIN SODIUM (PORCINE) 1000 UNIT/ML IJ SOLN
INTRAMUSCULAR | Status: AC
  Filled 2022-03-12: qty 4

## 2022-03-12 MED ORDER — ONDANSETRON HCL 4 MG/2ML IJ SOLN
4.0000 mg | Freq: Four times a day (QID) | INTRAMUSCULAR | Status: DC | PRN
  Administered 2022-03-12: 4 mg via INTRAVENOUS
  Filled 2022-03-12: qty 2

## 2022-03-12 NOTE — Progress Notes (Signed)
Pt appears notably less alert than earlier this morning.  Also quite restless.  Having periods of apnea with SpO2 down to 40s, quickly rebounds back to 90s upon arousing. ? ?Paged MD to discuss pt condition, no new orders at this time. ?

## 2022-03-12 NOTE — Progress Notes (Signed)
?[facility] ?Progress Note  ? ? ?Assessment/ Plan:   ?AKI: in the setting of severe septic shock likely ATN.  Normal kidney function baseline ?            - started CRRT 02/26/22 - 4/11 ? - tolerated dialysis without significant issues ? -Maintain TTS schedule for now; HD 4/15 (sat) w/ 2L net UF. bladder scan 4/18 am only 123m ?HD 4/18 net UF 2.3L but BP on lower side ? ? ?Still no signs of recovery; seen on HD -> will only try to UF 502m1L to allow renal recovery. Next HD on sat  ? ?He's very pleasant but confused and disoriented. ?  ?2.  Septic shock ?            - severe initially w/ high pressors requirement now improved.  Has now completed antibiotics ?  ?3.  Scrotal Edema ?            - urology following.  Urology feels this is unlikely the source of his infection. Continue w/ volume removal on HD. Per Dr. BoAlinda Moneyill need debridement for dry gangrene. ?  ?4.  Acute hypoxic RF ?            - Now extubated significantly improved.  Continue to monitor ? ?5.  Thrombocytopenia/ coagulopathy: Likely related cirrhosis and sepsis.  Now resolved ? ?6.  Anemia: Hemoglobin 7. 6060mof darbo given on 4/15. Transfusions if needed ? ? ?7.  Afib with RVR- hep--> bivalirudin gtt -> now holding anticoagulation ? ?8.  Foot wounds: associated with high-dose vasopressor requirements. Vascular and orthopedic surgery onboard. Likely will need a right AKA vs BKA. May be able to salvage the left heel per ortho.  ? ?9. Hyperphosphatemia: started on sevelamer -> incr to 3 tabs TIDM ?  ?10. Dispo: Floor status.  May need to start process of arranging outpatient hemodialysis.  Rehabilitation will also be a factor ? ?Subjective:   ? ?Patient is in bed on dialysis confused but pleasant ?  ?  ?  ? ?Objective:   ?BP 94/73   Pulse (!) 109   Temp 98.5 ?F (36.9 ?C)   Resp (!) 27   Ht '5\' 10"'  (1.778 m)   Wt (!) 139.3 kg   SpO2 98%   BMI 44.05 kg/m?  ? ?Intake/Output Summary (Last 24 hours) at 03/12/2022 1018 ?Last data  filed at 03/12/2022 0600 ?Gross per 24 hour  ?Intake 1450 ml  ?Output 250 ml  ?Net 1200 ml  ? ?Weight change: 6.35 kg ? ?Physical Exam: ?GENFAO:ZHYQMVHQIonfused, but in no distress ?HEENT eyes closed, no nasal discharge ?PULM bilateral chest rise with no increased work of breathing ?CV Normal rate, no audible rub ?ABD distended, soft, bowel sounds present ?GU: swollen scrotum but overall improved ?EXT LEE improved, mottled and cool bilateral soles of feet and toes, dry gangrene b/l  ?ACCESS: RIJ TC ? ?Imaging: ?DG Chest Port 1 View ? ?Result Date: 03/10/2022 ?CLINICAL DATA:  New onset of fever. EXAM: PORTABLE CHEST 1 VIEW COMPARISON:  Chest radiograph dated 02/26/2022. FINDINGS: Right-sided dialysis catheter with tip at the cavoatrial junction. Feeding tube extends below the diaphragm with tip beyond the inferior margin of the image. There is cardiomegaly with vascular congestion. No focal consolidation, pleural effusion, or pneumothorax. No acute osseous pathology. IMPRESSION: Cardiomegaly with vascular congestion. No focal consolidation. Electronically Signed   By: AraAnner CreteD.   On: 03/10/2022 19:21   ? ?Labs: ?BMET ?Recent Labs  ?  Lab 03/06/22 ?0530 03/07/22 ?0500 03/08/22 ?0530 03/09/22 ?6429 03/10/22 ?0379 03/11/22 ?0559 03/12/22 ?0134  ?NA 133* 136 134* 136 134* 137 136  ?K 3.9 4.1 4.2 4.4 5.2* 4.3 4.3  ?CL 98 100 98 100 97* 98 97*  ?CO2 24 21* 22 18* 17* 22 20*  ?GLUCOSE 123* 120* 108* 110* 129* 123* 131*  ?BUN 47* 75* 55* 79* 102* 64* 92*  ?CREATININE 4.68* 7.12* 6.49* 9.04* 11.60* 7.31* 9.44*  ?CALCIUM 8.2* 8.0* 8.0* 8.3* 8.0* 8.5* 8.4*  ?PHOS 7.3* 10.1* 8.5* 11.4*  --  8.9* 10.6*  ? ?CBC ?Recent Labs  ?Lab 03/07/22 ?0500 03/09/22 ?5583 03/10/22 ?1674 03/11/22 ?0559  ?WBC 10.2 10.8* 12.4* 12.8*  ?NEUTROABS 7.8*  --  9.9*  --   ?HGB 7.0* 7.1* 7.1* 7.5*  ?HCT 22.2* 22.3* 23.1* 24.1*  ?MCV 94.1 92.5 93.9 94.5  ?PLT 183 287 330 360  ? ? ?Medications:   ? ? aspirin EC  81 mg Oral Daily  ? chlorhexidine  gluconate (MEDLINE KIT)  15 mL Mouth Rinse BID  ? Chlorhexidine Gluconate Cloth  6 each Topical Q0600  ? darbepoetin (ARANESP) injection - DIALYSIS  60 mcg Intravenous Q Sat-HD  ? dronabinol  2.5 mg Oral QAC lunch  ? feeding supplement  237 mL Oral QID  ? feeding supplement (PROSource TF)  45 mL Per Tube QID  ? folic acid  1 mg Oral Daily  ? guaiFENesin  600 mg Oral BID  ? heparin injection (subcutaneous)  5,000 Units Subcutaneous Q8H  ? insulin aspart  0-15 Units Subcutaneous Q4H  ? leptospermum manuka honey  1 application. Topical Daily  ? mouth rinse  15 mL Mouth Rinse BID  ? melatonin  3 mg Oral QHS  ? metoprolol tartrate  37.5 mg Oral TID  ? multivitamin  1 tablet Oral QHS  ? sevelamer carbonate  2,400 mg Oral TID WC  ? sodium chloride flush  10-40 mL Intracatheter Q12H  ? ? ? ?Haston Casebolt W ? ?03/12/2022, 10:18 AM   ?

## 2022-03-12 NOTE — Plan of Care (Signed)
?  Problem: Education: ?Goal: Knowledge of General Education information will improve ?Description: Including pain rating scale, medication(s)/side effects and non-pharmacologic comfort measures ?Outcome: Progressing ?  ?Problem: Health Behavior/Discharge Planning: ?Goal: Ability to manage health-related needs will improve ?Outcome: Progressing ?  ?Problem: Clinical Measurements: ?Goal: Ability to maintain clinical measurements within normal limits will improve ?Outcome: Progressing ?Goal: Will remain free from infection ?Outcome: Progressing ?Goal: Diagnostic test results will improve ?Outcome: Progressing ?Goal: Respiratory complications will improve ?Outcome: Progressing ?Goal: Cardiovascular complication will be avoided ?Outcome: Progressing ?  ?Problem: Activity: ?Goal: Risk for activity intolerance will decrease ?Outcome: Progressing ?  ?Problem: Nutrition: ?Goal: Adequate nutrition will be maintained ?Outcome: Progressing ?  ?Problem: Coping: ?Goal: Level of anxiety will decrease ?Outcome: Progressing ?  ?Problem: Elimination: ?Goal: Will not experience complications related to bowel motility ?Outcome: Progressing ?Goal: Will not experience complications related to urinary retention ?Outcome: Progressing ?  ?Problem: Pain Managment: ?Goal: General experience of comfort will improve ?Outcome: Progressing ?  ?Problem: Safety: ?Goal: Ability to remain free from injury will improve ?Outcome: Progressing ?  ?Problem: Skin Integrity: ?Goal: Risk for impaired skin integrity will decrease ?Outcome: Progressing ?  ?Problem: Fluid Volume: ?Goal: Hemodynamic stability will improve ?Outcome: Progressing ?  ?Problem: Clinical Measurements: ?Goal: Diagnostic test results will improve ?Outcome: Progressing ?Goal: Signs and symptoms of infection will decrease ?Outcome: Progressing ?  ?Problem: Respiratory: ?Goal: Ability to maintain adequate ventilation will improve ?Outcome: Progressing ?  ?Problem: Activity: ?Goal: Ability  to tolerate increased activity will improve ?Outcome: Progressing ?  ?Problem: Respiratory: ?Goal: Ability to maintain a clear airway and adequate ventilation will improve ?Outcome: Progressing ?  ?Problem: Role Relationship: ?Goal: Method of communication will improve ?Outcome: Progressing ?  ?

## 2022-03-12 NOTE — Progress Notes (Addendum)
? ?VASCULAR SURGERY ASSESSMENT & PLAN:  ? ?GANGRENE OF BOTH FEET: There has been no significant change in the gangrene of both feet.  This patient is not making significant improvement overall.  I discussed with the wife this morning about potentially doing bilateral above-the-knee amputations tomorrow.  However she feels that he is not strong enough.  Her son is also having a hard time understanding all this.  I do not think she would be ready to proceed with any surgery until next week.  In addition [name] thinks he may be able to salvage the left heel.  I do not think the feet are necessarily making him septic but I think pushing forward with some of these issues will help with his overall progress.  Any surgery will have to be coordinated with Dr. Alinda Money who wants to debride his scrotum which also has dry gangrene. ? ?The wife would greatly appreciate medicine's input and cardiology's input as to when it would be safe to proceed with surgery and if it is safe to proceed at all. ? ?RENAL: It looks like he is on a Tuesday Thursday Saturday schedule.  He is on dialysis today. ? ?SUBJECTIVE:  ? ?No change in mental status ? ?PHYSICAL EXAM:  ? ?Vitals:  ? 03/11/22 2337 03/12/22 0430 03/12/22 0636 03/12/22 0804  ?BP: (!) 116/55 125/70 116/63 (!) 130/102  ?Pulse: (!) 103 (!) 116 97 (!) 119  ?Resp: _0 (!) 28  ?Temp: 97.6 ?F (36.4 ?C) 99.7 ?F (37.6 ?C) 99.2 ?F (37.3 ?C) 98.5 ?F (36.9 ?C)  ?TempSrc: Oral Oral Oral   ?SpO2: 96% 98%    ?Weight:   (!) 139.3 kg   ?Height:      ? ?No change in the dry gangrene of both feet. ? ?LABS:  ? ?Lab Results  ?Component Value Date  ? WBC 12.8 (H) 03/11/2022  ? HGB 7.5 (L) 03/11/2022  ? HCT 24.1 (L) 03/11/2022  ? MCV 94.5 03/11/2022  ? PLT 360 03/11/2022  ? ?Lab Results  ?Component Value Date  ? CREATININE 9.44 (H) 03/12/2022  ? ?Lab Results  ?Component Value Date  ? INR 1.3 (H) 02/26/2022  ? ?CBG (last 3)  ?Recent Labs  ?  03/11/22 ?2044 03/12/22 ?0016 03/12/22 ?7078  ?GLUCAP  118* 134* 146*  ? ? ?PROBLEM LIST:   ? ?Principal Problem: ?  Septic shock ([place]) ?Active Problems: ?  Pressure injury of skin ?  Encounter for central line placement ?  Malnutrition of moderate degree ? ? ?CURRENT MEDS:  ? ? aspirin EC  81 mg Oral Daily  ? chlorhexidine gluconate (MEDLINE KIT)  15 mL Mouth Rinse BID  ? Chlorhexidine Gluconate Cloth  6 each Topical Q0600  ? darbepoetin (ARANESP) injection - DIALYSIS  60 mcg Intravenous Q Sat-HD  ? dronabinol  2.5 mg Oral QAC lunch  ? feeding supplement  237 mL Oral QID  ? feeding supplement (PROSource TF)  45 mL Per Tube QID  ? folic acid  1 mg Oral Daily  ? guaiFENesin  600 mg Oral BID  ? heparin injection (subcutaneous)  5,000 Units Subcutaneous Q8H  ? insulin aspart  0-15 Units Subcutaneous Q4H  ? leptospermum manuka honey  1 application. Topical Daily  ? mouth rinse  15 mL Mouth Rinse BID  ? melatonin  3 mg Oral QHS  ? metoprolol tartrate  37.5 mg Oral TID  ? multivitamin  1 tablet Oral QHS  ? sevelamer carbonate  2,400 mg Oral TID  WC  ? sodium chloride flush  10-40 mL Intracatheter Q12H  ? ? ?Deitra Mayo ?Office: [phone] ?03/12/2022 ? ?

## 2022-03-12 NOTE — Consult Note (Signed)
? ? ?ORTHOPAEDIC CONSULTATION ? ?REQUESTING PHYSICIAN: Terrilee Croak, MD ? ?Chief Complaint: Gangrene bilateral feet. ? ?HPI: ?Jeffrey Ayala is a 61 y.o. male who presents with septic shock he was resuscitated and has developed dry gangrenous changes involving the entire right foot and left forefoot. ? ?Past Medical History:  ?Diagnosis Date  ? Concussion   ? Eosinophilic esophagitis   ? Gastric ulcer   ? History of kidney stones   ? Migraine   ? ?Past Surgical History:  ?Procedure Laterality Date  ? CYSTOSCOPY    ? DENTAL SURGERY    ? FINGER SURGERY    ? HERNIA REPAIR    ? at age 3  ? IR FLUORO GUIDE CV LINE RIGHT  03/09/2022  ? IR PATIENT EVAL TECH 0-60 MINS  03/09/2022  ? IR US GUIDE VASC ACCESS RIGHT  03/09/2022  ? KNEE ARTHROSCOPY Left   ? LITHOTRIPSY    ? NEPHROLITHOTOMY    ? TONSILLECTOMY    ? TOTAL KNEE ARTHROPLASTY Bilateral 11/04/2018  ? Procedure: BILATERAL TOTAL KNEE ARTHROPLASTY;  Surgeon: [name], MD;  Location: [facility];  Service: Orthopedics;  Laterality: Bilateral;  spinal/epidural per anesthesiologist  ? ?Social History  ? ?Socioeconomic History  ? Marital status: Married  ?  Spouse name: Not on file  ? Number of children: Not on file  ? Years of education: Not on file  ? Highest education level: Not on file  ?Occupational History  ? Not on file  ?Tobacco Use  ? Smoking status: Never  ? Smokeless tobacco: Never  ?Vaping Use  ? Vaping Use: Never used  ?Substance and Sexual Activity  ? Alcohol use: Yes  ?  Comment: occassional  ? Drug use: No  ? Sexual activity: Not on file  ?Other Topics Concern  ? Not on file  ?Social History Narrative  ? Not on file  ? ?Social Determinants of Health  ? ?Financial Resource Strain: Not on file  ?Food Insecurity: Not on file  ?Transportation Needs: Not on file  ?Physical Activity: Not on file  ?Stress: Not on file  ?Social Connections: Not on file  ? ?Family History  ?Problem Relation Age of Onset  ? COPD Mother   ? Sarcoidosis Brother   ? Cancer Brother   ?  Stomach cancer Neg Hx   ? Pancreatic cancer Neg Hx   ? Esophageal cancer Neg Hx   ? Colon cancer Neg Hx   ? Rectal cancer Neg Hx   ? ?- negative except otherwise stated in the family history section ?Allergies  ?Allergen Reactions  ? Codeine Hives  ?  Insomnia, jittery  ? Flomax [Tamsulosin Hcl] Nausea Only  ?  Nausea and fatigue  ? ?Prior to Admission medications   ?Medication Sig Start Date End Date Taking? Authorizing Provider  ?acetaminophen (TYLENOL) 650 MG CR tablet Take 650 mg by mouth every 8 (eight) hours as needed for pain or fever.   Yes [provider]  ?Calcium Carbonate-Vitamin D (OSCAL 500 D-3 PO) Take 1 tablet by mouth daily.   Yes [provider]  ?naproxen sodium (ALEVE) 220 MG tablet Take 220 mg by mouth daily as needed (pain.).   Yes [provider]  ?OVER THE COUNTER MEDICATION Take 1 capsule by mouth daily. Prostate vitamin   Yes [provider]  ?allopurinol (ZYLOPRIM) 100 MG tablet Take 1 tablet (100 mg total) by mouth daily. ?Patient not taking: Reported on 02/22/2022 11/14/18   Rayburn, Neta Mends, PA-C  ?baclofen (  LIORESAL) 10 MG tablet TAKE 1 TABLET BY MOUTH THREE TIMES A DAY AS NEEDED ?Patient not taking: Reported on 02/22/2022 09/02/20   Yetta Flock, MD  ?colchicine-probenecid 0.5-500 MG tablet Take 1 tablet by mouth 2 (two) times daily. ?Patient not taking: Reported on 02/22/2022 11/14/18   Rayburn, Neta Mends, PA-C  ?fluticasone (FLOVENT HFA) 220 MCG/ACT inhaler Inhale 2 puffs into the lungs in the morning and at bedtime. Do not eat or drink 30 minutes after inhalation ?Patient not taking: Reported on 02/22/2022 02/12/20   Yetta Flock, MD  ?pantoprazole (PROTONIX) 40 MG tablet TAKE 1 TABLET BY MOUTH TWICE A DAY ?Patient not taking: Reported on 02/22/2022 07/01/20   Yetta Flock, MD  ? ?DG Chest [facility] ? ?Result Date: 03/10/2022 ?CLINICAL DATA:  New onset of fever. EXAM: PORTABLE CHEST 1 VIEW COMPARISON:  Chest radiograph  dated 02/26/2022. FINDINGS: Right-sided dialysis catheter with tip at the cavoatrial junction. Feeding tube extends below the diaphragm with tip beyond the inferior margin of the image. There is cardiomegaly with vascular congestion. No focal consolidation, pleural effusion, or pneumothorax. No acute osseous pathology. IMPRESSION: Cardiomegaly with vascular congestion. No focal consolidation. Electronically Signed   By: Anner Crete M.D.   On: 03/10/2022 19:21   ?- pertinent xrays, CT, MRI studies were reviewed and independently interpreted ? ?Positive ROS: All other systems have been reviewed and were otherwise negative with the exception of those mentioned in the HPI and as above. ? ?Physical Exam: ?General: Patient is not alert or oriented does not respond appropriately to questions. ?Psychiatric: Patient is not competent for consent with abnormal mood and affect ?Lymphatic: No axillary or cervical lymphadenopathy ?Cardiovascular: No pedal edema ?Respiratory: No cyanosis, no use of accessory musculature ?GI: No organomegaly, abdomen is soft and non-tender ? ? ? ?Images: ? ?'@ENCIMAGES'$ @ ? ?Labs: ? ?Lab Results  ?Component Value Date  ? HGBA1C 5.5 02/23/2022  ? ESRSEDRATE 20 (H) 02/22/2022  ? CRP 13.8 (H) 02/23/2022  ? LABURIC 6.3 11/14/2018  ? LABURIC 7.4 04/10/2014  ? REPTSTATUS PENDING 03/10/2022  ? GRAMSTAIN  02/26/2022  ?  ABUNDANT SQUAMOUS EPITHELIAL CELLS PRESENT ?ABUNDANT WBC PRESENT,BOTH PMN AND MONONUCLEAR ?RARE GRAM POSITIVE COCCI ?RARE GRAM VARIABLE ROD ?RARE BUDDING YEAST SEEN YEAST WITH PSEUDOHYPHAE ?Performed at [facility], [place] [address], [place], [place] [address] ?  ? CULT  03/10/2022  ?  NO GROWTH 2 DAYS ?Performed at [facility], [place] [address] [place], [place] [address] ?  ? ? ?Lab Results  ?Component Value Date  ? ALBUMIN 2.2 (L) 03/12/2022  ? ALBUMIN 2.3 (L) 03/11/2022  ? ALBUMIN 2.3 (L) 03/10/2022  ? LABURIC 6.3 11/14/2018  ? LABURIC 7.4 04/10/2014  ? ? ? ? ?  Latest Ref  Rng & Units 03/11/2022  ?  5:59 AM 03/10/2022  ?  3:37 AM 03/09/2022  ?  5:20 AM  ?CBC EXTENDED  ?WBC 4.0 - 10.5 K/uL 12.8   12.4   10.8    ?RBC 4.22 - 5.81 MIL/uL 2.55   2.46   2.41    ?Hemoglobin 13.0 - 17.0 g/dL 7.5   7.1   7.1    ?HCT 39.0 - 52.0 % 24.1   23.1   22.3    ?Platelets 150 - 400 K/uL 360   330   287    ?NEUT# 1.7 - 7.7 K/uL  9.9     ?Lymph# 0.7 - 4.0 K/uL  1.0     ? ? ?  Neurologic: Patient does not have protective sensation bilateral lower extremities. ? ? ?MUSCULOSKELETAL:  ? ?Skin: Examination patient has dry gangrenous changes involving the entire right foot.  Examination of the left foot patient has ischemic gangrenous changes to the forefoot with improvement of the ischemic changes to the midfoot.  Patient has a strong palpable posterior tibial pulse. ? ?Patient's hemoglobin is 7.5 white blood cell count 12.8 albumin 2.2 with a recent and C-reactive protein and sed rate that are elevated. ? ?Assessment: ? ?Assessment: Status post resuscitation from shock with gangrene involving the entire right foot and gangrene of the left forefoot. ? ?Plan: I agree that a below-knee or above-knee amputation is appropriate for the right lower extremity.  Patient may benefit from being able to transfer with his hindfoot on the left.  Patient may be a candidate for a chopart amputation on the left. ? ?.  Meridee Score, MD ?[place] ?[phone] ?8:20 AM ? ? ? ? ?

## 2022-03-12 NOTE — Progress Notes (Signed)
Pt back from HD, three assist to clean up due to leaking flexiseal.  After which pt noted to have persistent hiccups and appeared to be nauseated with dry heaves.  Tube feeds, which had been restarted after clean up, again were stopped.  Provider notified.  Received order to continue to hold tube feeding for 2 hrs (until 3pm) and an order for IV zofran. ? ?Zofran administered, holding all scheduled meds at this time until nausea has time to resolve. ?

## 2022-03-12 NOTE — Progress Notes (Signed)
?PROGRESS NOTE ? ?Jeffrey Ayala  ?DOB: 1961/04/02  ?PCP: Patient, No Pcp Per (Inactive) ?BLT:[id number]  ?[name]: 02/22/2022 ? LOS: 18 days  ?Hospital Day: 19 ? ?Brief narrative: ?Jeffrey Ayala is a 61 y.o. male with PMH significant for arthritis, bilateral knee replacement, gastric ulcer, kidney stones, eosinophilic esophagitis, migraines not on any regular meds.   ?Patient presented to the ED on 4/2 with complaint of nausea, vomiting, diarrhea and confusion for 24 hours.   ?EMS noted him to be in A-fib with a heart rate of 120-160 bpm. ?In the ED, he had a fever of 102.9. ?CT abdomen pelvis showed swollen scrotum concerning for infection, colon filled with diarrhea.  ?He was hypotensive requiring multiple vasopressors in the ICU.   ?4/3, patient required intubation and also had a brief cardiac arrest postintubation.   ?4/5, in an effort to correct vasoplegia, patient was also started on [name] ?Because of septic shock, patient's renal function worsened. ?4/6, patient was started on CRRT ?4/11, patient was extubated. ?4/14, patient was transferred out to [facility] service. ? ?Subjective: ?Patient was seen and examined this morning.  ?In dialysis unit. ?Awake, alert, confused, dangling his legs out of the bed.  Acknowledges my presence but unable to have a conversation. ?I met his wife in the room. ?Per wife, patient's mental status improved during the day yesterday and is able to identify his family members.  But he looked uncomfortable and was given a dose of oxycodone after which he became more somnolent. ?Continues to have diarrhea. ?Has ongoing NG tube feeding. ?No fever in last 24 hours.  Heart rate more than 100 persistently. ? ?Principal Problem: ?  Septic shock ([place]) ?Active Problems: ?  Pressure injury of skin ?  Encounter for central line placement ?  Severe protein-calorie malnutrition ([place]) ?  Altered mental status ?  Gangrene of right foot ([place]) ?  Gangrene of left foot ([place]) ?  Malnutrition  of moderate degree ?  ?Assessment and Plan: ?Septic shock - POA ?-Patient was in septic shock on admission, thought due to scrotal cellulitis and possible aspiration pneumonia from n/v ?-While in ICU, patient required multiple vasopressors including Levophed, epinephrine and vasopressin.  Also given a trial of methylene blue.  Eventually taken off pressors. ?-Blood culture did not show any growth.  Urine culture with multiple species.  Completed a course of antibiotics. ?-Urology follow-up appreciated.  Noted a plan of scrotum debridement tentatively tomorrow timed with vascular surgery. ? ?New SIRS ?-4/18, patient spiked a fever of 102.4 with tachycardia.  Lactic acid level was normal. ?-Unclear cause of fever but high suspicion of an infectious etiology. ?-Chest x-ray not remarkable for pneumonia.  ?-Blood culture sent again.  No growth so far. ?-Restarted on a course of broad-spectrum antibiotics with IV cefepime and vancomycin again. ?-No recurrence of fever in last 36 hours.  Continue to monitor. ?Recent Labs  ?Lab 03/06/22 ?0530 03/07/22 ?0500 03/09/22 ?0076 03/10/22 ?2263 03/10/22 ?1808 03/11/22 ?0559  ?WBC 10.7* 10.2 10.8* 12.4*  --  12.8*  ?LATICACIDVEN  --   --   --   --  1.3  --   ? ?Diarrhea ?-Per wife, patient continues to have diarrhea since admission.  Currently has a Flexi-Seal in place. ?-4/17, GI pathogen panel negative. ?-Continues to have liquid stool.  We will start him on low-dose Imodium scheduled. ?   ?Acute metabolic encephalopathy ?-Patient continues to have altered mental status, drowsiness, excessive sleepiness although with intermittent slight improvement.  Multifactorial: Sepsis,  uremia, possible hypoxemic injury during septic shock.  ?-No evidence of intracranial infection. 4/15, CT head unremarkable. ?-Ammonia level unremarkable. ?-We will avoid oxycodone and try him on low-dose Seroquel. ? ?AKI from septic shock -new dialysis dependence ?-Started CRRT 4/64/11, now on iHD ?-4/17,  underwent TLC catheter placed ?-Nephrology following.  No urine output ? ?Dry gangrene of both feet ?-Secondary to vasospasm due to high-dose of vasopressors required for septic shock. ?-His wounds in both feet are continuing to demarcate.  Patient may ultimately require bilateral AKA  ?-Vascular surgery and orthopedics following. ? ?Acute anemia ?-Baseline hemoglobin normal.  Gradually downtrending hemoglobin level this hospitalization.  Currently remains between 7 and 8.  Patient did not require any PRBC transfusion this hospitalization. ?Recent Labs  ?  03/06/22 ?0530 03/07/22 ?0500 03/09/22 ?6948 03/10/22 ?5462 03/11/22 ?0559  ?HGB 7.9* 7.0* 7.1* 7.1* 7.5*  ?MCV 93.6 94.1 92.5 93.9 94.5  ? ?New onset of afib, presents on admission ?-echocardiogram with preserved lvef ?-remains in rate controlled afib ?-Was on heparin drip initially but it was stopped because of drop in hemoglobin. ?-Cardiology consulted. Currently on metoprolol 37.5 mg 3 times daily and aspirin 81 mg daily. ?-If hemoglobin improves, will consider Eliquis at discharge. ?-Per cardiology, plan for outpatient DCCV if remains in A-fib and able to tolerate AC. ? ?Shock liver ?-Liver enzymes elevated likely due to shock.  Improving numbers.   ?-CT abd did not show hepatobiliary abnormality ?Recent Labs  ?Lab 03/07/22 ?0500 03/08/22 ?0530 03/09/22 ?7035 03/10/22 ?0093 03/11/22 ?0559 03/12/22 ?0134  ?AST 46*  --   --  29  --   --   ?ALT 78*  --   --  41  --   --   ?ALKPHOS 266*  --   --  183*  --   --   ?BILITOT 3.4*  --   --  2.9*  --   --   ?PROT 6.1*  --   --  6.9  --   --   ?ALBUMIN 2.2*  2.2* 2.2* 2.3* 2.3* 2.3* 2.2*  ? ?Acute hypoxic respiratory failure ?-Initially required intubation, later extubated.  Currently on room air. ? ?Elevated troponin ?-Likely because of brief cardiac arrest and septic shock. ?-Echo showed EF of 60 to 65% with normal wall motion. ?-No further ischemic evaluation at this time per cardiology. ? ?Morbid obesity  ?-Body  mass index is 44.05 kg/m?Marland Kitchen Patient has been advised to make an attempt to improve diet and exercise patterns to aid in weight loss. ? ?Poor oral intake ?-Due to altered mentation.  Dietitian consult appreciated.  Currently getting tube feeding through core track.. ? ?Goals of care ?  Code Status: Full Code  ? ? ?Mobility: Encourage PT participation once mental status improves ? ?Skin assessment:  ?Pressure Injury 02/22/22 Coccyx Medial Stage 2 -  Partial thickness loss of dermis presenting as a shallow open injury with a red, pink wound bed without slough. pink, painful (Active)  ?02/22/22 2330  ?Location: Coccyx  ?Location Orientation: Medial  ?Staging: Stage 2 -  Partial thickness loss of dermis presenting as a shallow open injury with a red, pink wound bed without slough.  ?Wound Description (Comments): pink, painful  ?Present on Admission: Yes  ? ? ?Nutritional status:  ?Body mass index is 44.05 kg/m?Marland Kitchen  ?Nutrition Problem: Moderate Malnutrition ?Etiology: acute illness (septic shock, A fib, AKI) ?Signs/Symptoms: mild fat depletion, mild muscle depletion, moderate muscle depletion ? ? ? ? ?Diet:  ?Diet Order   ? ?       ?  Diet regular Room service appropriate? Yes; Fluid consistency: Thin  Diet effective now       ?  ? ?  ?  ? ?  ? ? ?DVT prophylaxis:  ?heparin injection 5,000 Units Start: 03/10/22 0600 ?SCDs Start: 02/22/22 2249 ?  ?Antimicrobials: IV cefepime and vancomycin since 4/18 ?Fluid: None ?Consultants: Nephrology, cardiology ?Family Communication: Wife at bedside ? ?Status is: Inpatient ? ?Continue in-hospital care because: Continues to have multiple active medical issues ?Level of care: Progressive  ? ?Dispo: The patient is from: Home ?             Anticipated d/c is to: Likely SNF ?             Patient currently is not medically stable to d/c. ?  Difficult to place patient No ? ? ? ? ?Infusions:  ? sodium chloride Stopped (03/06/22 2355)  ? ceFEPime (MAXIPIME) IV 1 g (03/11/22 2305)  ? feeding  supplement (NEPRO CARB STEADY) 1,000 mL (03/11/22 2314)  ? vancomycin    ? ? ?Scheduled Meds: ? aspirin EC  81 mg Oral Daily  ? chlorhexidine gluconate (MEDLINE KIT)  15 mL Mouth Rinse BID  ? Chlorhexidine Gluco

## 2022-03-12 NOTE — Progress Notes (Addendum)
? ?Progress Note ? ?Patient Name: Jeffrey Ayala ?Date of Encounter: 03/12/2022 ? ?[facility] Cardiologist: Candee Furbish, MD  ? ?Subjective  ? ?No acute overnight events. Saw patient while he was in dialysis. He is still very encephalopathic - could not even tell us his name. Resting comfortably. Denies any pain. Remains in atrial fibrillation with rates in the 120s during dialysis. ? ?Inpatient Medications  ?  ?Scheduled Meds: ? aspirin EC  81 mg Oral Daily  ? chlorhexidine gluconate (MEDLINE KIT)  15 mL Mouth Rinse BID  ? Chlorhexidine Gluconate Cloth  6 each Topical Q0600  ? darbepoetin (ARANESP) injection - DIALYSIS  60 mcg Intravenous Q Sat-HD  ? dronabinol  2.5 mg Oral QAC lunch  ? feeding supplement  237 mL Oral QID  ? feeding supplement (PROSource TF)  45 mL Per Tube QID  ? folic acid  1 mg Oral Daily  ? guaiFENesin  600 mg Oral BID  ? heparin injection (subcutaneous)  5,000 Units Subcutaneous Q8H  ? insulin aspart  0-15 Units Subcutaneous Q4H  ? leptospermum manuka honey  1 application. Topical Daily  ? mouth rinse  15 mL Mouth Rinse BID  ? melatonin  3 mg Oral QHS  ? metoprolol tartrate  37.5 mg Oral TID  ? multivitamin  1 tablet Oral QHS  ? sevelamer carbonate  2,400 mg Oral TID WC  ? sodium chloride flush  10-40 mL Intracatheter Q12H  ? ?Continuous Infusions: ? sodium chloride Stopped (03/06/22 2355)  ? ceFEPime (MAXIPIME) IV 1 g (03/11/22 2305)  ? feeding supplement (NEPRO CARB STEADY) 1,000 mL (03/11/22 2314)  ? vancomycin    ? ?PRN Meds: ?acetaminophen, docusate sodium, heparin sodium (porcine), metoprolol tartrate, oxyCODONE, phenol, polyethylene glycol, sodium chloride flush, white petrolatum  ? ?Vital Signs  ?  ?Vitals:  ? 03/12/22 0830 03/12/22 0900 03/12/22 0930 03/12/22 1000  ?BP: 118/61 (!) 131/112 (!) 149/90 94/73  ?Pulse:   (!) 109   ?Resp: (!) 21 (!) 23 (!) 26 (!) 27  ?Temp:      ?TempSrc:      ?SpO2:      ?Weight:      ?Height:      ? ? ?Intake/Output Summary (Last 24 hours) at  03/12/2022 1013 ?Last data filed at 03/12/2022 0600 ?Gross per 24 hour  ?Intake 1450 ml  ?Output 250 ml  ?Net 1200 ml  ? ? ?  03/12/2022  ?  6:36 AM 03/11/2022  ?  5:00 AM  ?Last 3 Weights  ?Weight (lbs) 307 lb 300 lb  ?Weight (kg) 139.254 kg 136.079 kg  ?   ? ?Telemetry  ?  ?Atrial fibrillation with rates in the 120s during dialysis. - Personally Reviewed ? ?ECG  ?  ?No new ECG tracing today. - Personally Reviewed ? ?Physical Exam  ? ?GEN: No acute distress.   ?Neck: No JVD ?Cardiac: Tachycardic with irregularly irregular rhythm. No murmurs, rubs, or gallops.  ?Respiratory: Clear to auscultation anteriorly.   ?GI: Soft, non-distended, and non-tender. ?MS: No lower extremity edema.  ?Skin: Warm and dry. Gangrenous bilateral feet (right > left). ?Neuro:  Alert but not oriented. No focal deficits. ?Psych: Encephalopathic.  ? ?Labs  ?  ?High Sensitivity Troponin:   ?Recent Labs  ?Lab 02/22/22 ?1805 02/22/22 ?2007 02/23/22 ?8546 02/23/22 ?1208  ?TROPONINIHS 525* 1,834* 2,807* 3,550*  ?   ?Chemistry ?Recent Labs  ?Lab 03/06/22 ?0530 03/07/22 ?0500 03/08/22 ?0530 03/09/22 ?2703 03/10/22 ?5009 03/11/22 ?0559 03/12/22 ?0134  ?NA 133* 136  134*   < > 134* 137 136  ?K 3.9 4.1 4.2   < > 5.2* 4.3 4.3  ?CL 98 100 98   < > 97* 98 97*  ?CO2 24 21* 22   < > 17* 22 20*  ?GLUCOSE 123* 120* 108*   < > 129* 123* 131*  ?BUN 47* 75* 55*   < > 102* 64* 92*  ?CREATININE 4.68* 7.12* 6.49*   < > 11.60* 7.31* 9.44*  ?CALCIUM 8.2* 8.0* 8.0*   < > 8.0* 8.5* 8.4*  ?MG 2.6* 2.7* 2.5*  --   --   --   --   ?PROT  --  6.1*  --   --  6.9  --   --   ?ALBUMIN 2.3* 2.2*  2.2* 2.2*   < > 2.3* 2.3* 2.2*  ?AST  --  46*  --   --  29  --   --   ?ALT  --  78*  --   --  41  --   --   ?ALKPHOS  --  266*  --   --  183*  --   --   ?BILITOT  --  3.4*  --   --  2.9*  --   --   ?GFRNONAA 14* 8* 9*   < > 5* 8* 6*  ?ANIONGAP '11 15 14   ' < > 20* 17* 19*  ? < > = values in this interval not displayed.  ?  ?Lipids No results for input(s): CHOL, TRIG, HDL, LABVLDL, LDLCALC,  CHOLHDL in the last 168 hours.  ?Hematology ?Recent Labs  ?Lab 03/09/22 ?9628 03/10/22 ?3662 03/11/22 ?0559  ?WBC 10.8* 12.4* 12.8*  ?RBC 2.41* 2.46* 2.55*  ?HGB 7.1* 7.1* 7.5*  ?HCT 22.3* 23.1* 24.1*  ?MCV 92.5 93.9 94.5  ?MCH 29.5 28.9 29.4  ?MCHC 31.8 30.7 31.1  ?RDW 16.6* 16.4* 16.3*  ?PLT 287 330 360  ? ?Thyroid No results for input(s): TSH, FREET4 in the last 168 hours.  ?BNPNo results for input(s): BNP, PROBNP in the last 168 hours.  ?DDimer No results for input(s): DDIMER in the last 168 hours.  ? ?Radiology  ?  ?DG Chest Port 1 View ? ?Result Date: 03/10/2022 ?CLINICAL DATA:  New onset of fever. EXAM: PORTABLE CHEST 1 VIEW COMPARISON:  Chest radiograph dated 02/26/2022. FINDINGS: Right-sided dialysis catheter with tip at the cavoatrial junction. Feeding tube extends below the diaphragm with tip beyond the inferior margin of the image. There is cardiomegaly with vascular congestion. No focal consolidation, pleural effusion, or pneumothorax. No acute osseous pathology. IMPRESSION: Cardiomegaly with vascular congestion. No focal consolidation. Electronically Signed   By: Anner Crete M.D.   On: 03/10/2022 19:21   ? ?Cardiac Studies  ? ?Echocardiogram 02/23/2022: ?Impressions:1. Left ventricular ejection fraction, by estimation, is 60 to 65%. The  ?left ventricle has normal function. The left ventricle has no regional  ?wall motion abnormalities. There is mild left ventricular hypertrophy.  ?Left ventricular diastolic function  ?could not be evaluated.  ? 2. Right ventricular systolic function is normal. The right ventricular  ?size is normal.  ? 3. The mitral valve is normal in structure. No evidence of mitral valve  ?regurgitation. No evidence of mitral stenosis.  ? 4. The aortic valve is tricuspid. Aortic valve regurgitation is not  ?visualized. No aortic stenosis is present.  ? 5. The inferior vena cava is dilated in size with <50% respiratory  ?variability, suggesting right atrial pressure of 15 mmHg.   ? ?  Comparison(s): No prior Echocardiogram. ?_______________ ?  ?ABIs/TBIs 02/24/2022: ?Summary:  ?Right: Resting right ankle-brachial index indicates severe right lower  ?extremity arterial disease.  ? ?Unable to obtain TBI due to low amplitude waveforms.  ?Left: Resting left ankle-brachial index indicates noncompressible left  ?lower extremity arteries.  ? ?Unable to obtain TBI due to low amplitude waveforms. ?_______________ ?  ?ABIs/TBIs 03/04/2022: ?Summary: ?Right: Resting right ankle-brachial index indicates noncompressible right  ?lower extremity arteries.  ? ?Left: Resting left ankle-brachial index indicates noncompressible left  ?lower extremity arteries. The left toe-brachial index is abnormal. ? ?Patient Profile  ?   ?61 y.o. male gastric ulcers, eosinophilic esophagitis, arthritis, and migraines who was admitted on 02/22/2022 with septic shock after presenting with nausea, vomiting, and diarrhea. Abdominal/pelvic CT was notable for swollen scrotum concerning for infection and colon filled with diarrhea. No clear etiology but thought to be due to scrotal cellulitis and possible aspiration pneumonia. Urology was consulted and has been following. He was started on pressors, IV fluids, and IV antibiotics. He was also noted to be in atrial fibrillation with RVR on admission which was felt to likely be secondary to sepsis. Hospitalization has been complicated by acute hypoxic respiratory arrest requiring intubation on 4/3 (extubated 4/11), brief cardiac arrest post-intubation, and AKI requiring CRRT and now intermittent hemodialysis, acute metabolic encephalopathy, and bilateral foot wounds felt to be secondary to pressors. Cardiology consulted for assistance of atrial fibrillation.  ? ?Assessment & Plan  ?  ?New Onset Atrial Fibrillation ?Initially in the setting of septic shock. Remains in atrial fibrillation. Rates in the 120s during dialysis. Otherwise, looks like rates are in the 100s. Echo showed normal LV  function. ?- Continue Lopressor to 37.5 mg twice daily. BP soft during dialysis so unable to up-titrate. ?- CHA2DS2-VASc = 0-1 (aortic and coronary artery calcifications noted on CT). Was on IV Heparin for VTE prop

## 2022-03-13 DIAGNOSIS — I96 Gangrene, not elsewhere classified: Secondary | ICD-10-CM | POA: Diagnosis not present

## 2022-03-13 DIAGNOSIS — A419 Sepsis, unspecified organism: Secondary | ICD-10-CM | POA: Diagnosis not present

## 2022-03-13 DIAGNOSIS — R6521 Severe sepsis with septic shock: Secondary | ICD-10-CM | POA: Diagnosis not present

## 2022-03-13 LAB — RENAL FUNCTION PANEL
Albumin: 2.1 g/dL — ABNORMAL LOW (ref 3.5–5.0)
Anion gap: 16 — ABNORMAL HIGH (ref 5–15)
BUN: 66 mg/dL — ABNORMAL HIGH (ref 8–23)
CO2: 21 mmol/L — ABNORMAL LOW (ref 22–32)
Calcium: 8.5 mg/dL — ABNORMAL LOW (ref 8.9–10.3)
Chloride: 97 mmol/L — ABNORMAL LOW (ref 98–111)
Creatinine, Ser: 7.1 mg/dL — ABNORMAL HIGH (ref 0.61–1.24)
GFR, Estimated: 8 mL/min — ABNORMAL LOW (ref >60)
Glucose, Bld: 130 mg/dL — ABNORMAL HIGH (ref 70–99)
Phosphorus: 8.4 mg/dL — ABNORMAL HIGH (ref 2.5–4.6)
Potassium: 4.2 mmol/L (ref 3.5–5.1)
Sodium: 134 mmol/L — ABNORMAL LOW (ref 135–145)

## 2022-03-13 LAB — GLUCOSE, CAPILLARY
Glucose-Capillary: 117 mg/dL — ABNORMAL HIGH (ref 70–99)
Glucose-Capillary: 137 mg/dL — ABNORMAL HIGH (ref 70–99)
Glucose-Capillary: 113 mg/dL — ABNORMAL HIGH (ref 70–99)
Glucose-Capillary: 125 mg/dL — ABNORMAL HIGH (ref 70–99)
Glucose-Capillary: 98 mg/dL (ref 70–99)

## 2022-03-13 LAB — CBC
HCT: 19.8 % — ABNORMAL LOW (ref 39.0–52.0)
HCT: 19.1 % — ABNORMAL LOW (ref 39.0–52.0)
Hemoglobin: 6 g/dL — CL (ref 13.0–17.0)
Hemoglobin: 5.9 g/dL — CL (ref 13.0–17.0)
MCH: 28.6 pg (ref 26.0–34.0)
MCH: 28.9 pg (ref 26.0–34.0)
MCHC: 30.3 g/dL (ref 30.0–36.0)
MCHC: 30.9 g/dL (ref 30.0–36.0)
MCV: 94.3 fL (ref 80.0–100.0)
MCV: 93.6 fL (ref 80.0–100.0)
Platelets: 311 10*3/uL (ref 150–400)
Platelets: 295 10*3/uL (ref 150–400)
RBC: 2.1 MIL/uL — ABNORMAL LOW (ref 4.22–5.81)
RBC: 2.04 MIL/uL — ABNORMAL LOW (ref 4.22–5.81)
RDW: 16.2 % — ABNORMAL HIGH (ref 11.5–15.5)
RDW: 16.2 % — ABNORMAL HIGH (ref 11.5–15.5)
WBC: 10 10*3/uL (ref 4.0–10.5)
WBC: 9.5 10*3/uL (ref 4.0–10.5)
nRBC: 0 % (ref 0.0–0.2)
nRBC: 0 % (ref 0.0–0.2)

## 2022-03-13 LAB — HEMOGLOBIN AND HEMATOCRIT, BLOOD
HCT: 20.7 % — ABNORMAL LOW (ref 39.0–52.0)
Hemoglobin: 6.6 g/dL — CL (ref 13.0–17.0)

## 2022-03-13 LAB — PREPARE RBC (CROSSMATCH)

## 2022-03-13 MED ORDER — SODIUM CHLORIDE 0.9% IV SOLUTION: Freq: Once | INTRAVENOUS | Status: AC

## 2022-03-13 NOTE — Progress Notes (Signed)
Nutrition Follow-up ? ?DOCUMENTATION CODES:  ? ?Non-severe (moderate) malnutrition in context of acute illness/injury ? ?INTERVENTION:  ? ?Continue TF via Cortrak tube: ?Nepro at 50 ml/h (1200 ml/day). ?Prosource TF 45 ml QID. ?Provides 2320 kcal, 141 gm protein, 872 ml free water daily. ? ?Continue to offer Ensure Enlive po QID, each supplement provides 350 kcal and 20 grams of protein. ? ?Continue regular diet.  ? ?Continue Rena-vit daily. ? ?NUTRITION DIAGNOSIS:  ? ?Moderate Malnutrition related to acute illness (septic shock, A fib, AKI) as evidenced by mild fat depletion, mild muscle depletion, moderate muscle depletion. ? ?Ongoing  ? ?GOAL:  ? ?Patient will meet greater than or equal to 90% of their needs ? ?Met with TF ? ?MONITOR:  ? ?PO intake, Supplement acceptance, Labs, Skin ? ?REASON FOR ASSESSMENT:  ? ?Consult ?Calorie Count ? ?ASSESSMENT:  ? ?60-year-old male who presented to the ED on 4/02 with N/V/D. PMH of HTN, PUD, eosinophilic esophagitis, kidney stones. Pt admitted with septic shock, new onset atrial fibrillation, AKI. ? ?Last HD 4/20 with 1 L UF. ?UOP 0 ml x 24 hours. ?50 ml stool output x 24 hours. ? ?Per wife, patient is much more alert today and seems to be more like himself. He ate almost a whole scrambled egg and 6 oz of orange juice this morning for breakfast. He has not been drinking much Ensure. He is tolerating the tube feeding well. Tube feeding was held yesterday x 3 hours d/t hiccupping. Tube feeding is meeting 100% of estimated needs. Oral intake remains minimal.  ? ?Plans for amputation to RLE and LLE next week.  ? ?Labs reviewed. Na 134, phos 8.4 (Renvela started 4/15, but patient is refusing at times) ?CBG: 137-113 ? ?Medications reviewed and include Aranesp, Marinol, folic acid, Novolog, Imodium, Rena-vit, Renvela. ? ?Diet Order:   ?Diet Order   ? ?       ?  Diet regular Room service appropriate? Yes; Fluid consistency: Thin  Diet effective now       ?  ? ?  ?  ? ?   ? ? ?EDUCATION NEEDS:  ? ?No education needs have been identified at this time ? ?Skin:  Skin Assessment: Skin Integrity Issues: ?Skin Integrity Issues:: Other (Comment), Stage II ?Stage II: coccyx ?Other: skin tear L thigh; bilateral feet and toes are necrotic ? ?Last BM:  4/20 rectal tube ? ?Height:  ? ?Ht Readings from Last 1 Encounters:  ?02/26/22 5' 10" (1.778 m)  ? ? ?Weight:  ? ?Wt Readings from Last 1 Encounters:  ?03/13/22 (!) 137.4 kg  ? ? ?Ideal Body Weight:  75.5 kg ? ?BMI:  Body mass index is 43.48 kg/m?. ? ?Estimated Nutritional Needs:  ? ?Kcal:  2300-2500 ? ?Protein:  150-170 grams ? ?Fluid:  >/= 2.0 L ? ? ? ? H. RD, LDN, CNSC ?Please refer to Amion for contact information.                                                       ? ?

## 2022-03-13 NOTE — Progress Notes (Signed)
Physical Therapy Treatment ?Patient Details ?Name: Jeffrey Ayala ?MRN: [id number] ?DOB: 03-27-1961 ?Today's Date: 03/13/2022 ? ? ?History of Present Illness Pt adm 4/2 c/o N/V/D and found to have septic shock due to scrotal cellulitis vs RLL PNA.  Pt intubated 4/3 and brief cardiac arrest post intubation. [name] started 4/5.  Pt with AKI and started on CRRT on 4/6. Pt extubated 4/10. Pt also with afib with rvr. PMH - Bil TKR, migraines ? ?  ?PT Comments  ? ? Pt more alert and interactive today. Continue to work on bed mobility, balance, and activity tolerance. With likely LE amputations in the future will continue to work on these skills in preparation for bed to chair transfers.   ?Recommendations for follow up therapy are one component of a multi-disciplinary discharge planning process, led by the attending physician.  Recommendations may be updated based on patient status, additional functional criteria and insurance authorization. ? ?Follow Up Recommendations ? Acute inpatient rehab (3hours/day) ?  ?  ?Assistance Recommended at Discharge Frequent or constant Supervision/Assistance  ?Patient can return home with the following Two people to help with walking and/or transfers;Two people to help with bathing/dressing/bathroom;Help with stairs or ramp for entrance;Assist for transportation ?  ?Equipment Recommendations ? Wheelchair (measurements PT);Wheelchair cushion (measurements PT);Hospital bed;Other (comment) (hoyer lift)  ?  ?Recommendations for Other Services   ? ? ?  ?Precautions / Restrictions Precautions ?Precautions: Fall ?Precaution Comments: feet and scrotum with gangrene and blisters, flexiseal, cortrak ?Restrictions ?Weight Bearing Restrictions: No  ?  ? ?Mobility ? Bed Mobility ?Overal bed mobility: Needs Assistance ?Bed Mobility: Supine to Sit, Rolling, Sit to Sidelying ?Rolling: +2 for physical assistance, Max assist ?  ?Supine to sit: Max assist, +2 for physical assistance, +2 for  safety/equipment, HOB elevated ?  ?Sit to sidelying: +2 for physical assistance, Max assist ?General bed mobility comments: Pt assisting moving legs off of bed. Assist to elevate trunk into sitting and bring hips to EOB. Assist to lower trunk and bring feet back up in bed. ?  ? ?Transfers ?  ?  ?  ?  ?  ?  ?  ?  ?  ?  ?  ? ?Ambulation/Gait ?  ?  ?  ?  ?  ?  ?  ?  ? ? ?Stairs ?  ?  ?  ?  ?  ? ? ?Wheelchair Mobility ?  ? ?Modified Rankin (Stroke Patients Only) ?  ? ? ?  ?Balance Overall balance assessment: Needs assistance ?Sitting-balance support: Bilateral upper extremity supported, Feet supported ?Sitting balance-Leahy Scale: Poor ?Sitting balance - Comments: Pt sat EOB x 15 minutes with min guard initially and progressing to mod assist as pt fatigued ?Postural control: Posterior lean ?  ?  ?  ?  ?  ?  ?  ?  ?  ?  ?  ?  ?  ?  ?  ? ?  ?Cognition Arousal/Alertness: Awake/alert (intermittent cues to stay alert as pt fatigued) ?Behavior During Therapy: Flat affect ?Overall Cognitive Status: Impaired/Different from baseline ?Area of Impairment: Attention, Memory, Following commands, Awareness, Problem solving, Orientation, Safety/judgement ?  ?  ?  ?  ?  ?  ?  ?  ?Orientation Level: Disoriented to, Place, Situation, Time (pt unable to answer any orientation questions) ?Current Attention Level: Sustained ?Memory: Decreased short-term memory, Decreased recall of precautions ?Following Commands: Follows one step commands with increased time, Follows one step commands inconsistently ?Safety/Judgement: Decreased awareness of safety, Decreased awareness of deficits ?  Awareness: Intellectual ?Problem Solving: Slow processing, Decreased initiation, Difficulty sequencing, Requires verbal cues, Requires tactile cues ?General Comments: Pt more alert than on prior visits. Engaged and able to participate with activities. Fatigues quickly. ?  ?  ? ?  ?Exercises   ? ?  ?General Comments General comments (skin integrity, edema, etc.):  VSS on RA ?  ?  ? ?Pertinent Vitals/Pain Pain Assessment ?Faces Pain Scale: Hurts little more ?Pain Location: ?rt elbow with flexion ?Pain Descriptors / Indicators: Grimacing ?Pain Intervention(s): Monitored during session  ? ? ?Home Living   ?  ?  ?  ?  ?  ?  ?  ?  ?  ?   ?  ?Prior Function    ?  ?  ?   ? ?PT Goals (current goals can now be found in the care plan section) Acute Rehab PT Goals ?Patient Stated Goal: not stated ?Progress towards PT goals: Progressing toward goals ? ?  ?Frequency ? ? ? Min 3X/week ? ? ? ?  ?PT Plan Current plan remains appropriate  ? ? ?Co-evaluation PT/OT/SLP Co-Evaluation/Treatment: Yes ?Reason for Co-Treatment: Complexity of the patient's impairments (multi-system involvement);For patient/therapist safety ?PT goals addressed during session: Mobility/safety with mobility;Balance ?  ?  ? ?  ?AM-PAC PT "6 Clicks" Mobility   ?Outcome Measure ? Help needed turning from your back to your side while in a flat bed without using bedrails?: Total ?Help needed moving from lying on your back to sitting on the side of a flat bed without using bedrails?: Total ?Help needed moving to and from a bed to a chair (including a wheelchair)?: Total ?Help needed standing up from a chair using your arms (e.g., wheelchair or bedside chair)?: Total ?Help needed to walk in hospital room?: Total ?Help needed climbing 3-5 steps with a railing? : Total ?6 Click Score: 6 ? ?  ?End of Session   ?Activity Tolerance: Patient tolerated treatment well;Patient limited by fatigue ?Patient left: in bed;with call bell/phone within reach;with family/visitor present;with bed alarm set ?Nurse Communication: Mobility status;Need for lift equipment ?PT Visit Diagnosis: Other abnormalities of gait and mobility (R26.89);Muscle weakness (generalized) (M62.81) ?  ? ? ?Time: [phone] ?PT Time Calculation (min) (ACUTE ONLY): 34 min ? ?Charges:  $Therapeutic Activity: 8-22 mins          ?          ? ?[facility] ?Acute  Rehabilitation Services ?Office [phone] ? ? ? ?Shary Decamp [facility] ?03/13/2022, 10:42 AM ? ?

## 2022-03-13 NOTE — Progress Notes (Signed)
?[facility] ?Progress Note  ? ? ?Assessment/ Plan:   ?AKI: in the setting of severe septic shock likely ATN.  Normal kidney function baseline ?            - started CRRT 02/26/22 - 4/11 ? - tolerated dialysis without significant issues ? -Maintain TTS schedule for now; HD 4/15 (sat) w/ 2L net UF. bladder scan 4/18 am only 143m -> will repeat today. Feels like he needs to go. ? ?HD 4/18 net UF 2.3L but BP on lower side, only 1L on 4/20 ? ?Still no signs of recovery;  ?Next HD on sat; again will limit UF to 1L to hopefully not cause prolongation of renal failure. Notified and explained to spouse who was bedside. Will also repeat bladder scan. ? ? ?2.  Septic shock ?            - severe initially w/ high pressors requirement now improved.  Has now completed antibiotics ?  ?3.  Scrotal Edema ?            - urology following.  Urology feels this is unlikely the source of his infection. Continue w/ volume removal on HD. Per Dr. BAlinda Moneywill need debridement for dry gangrene. ?  ?4.  Acute hypoxic RF ?            - Now extubated significantly improved.  Continue to monitor ? ?5.  Thrombocytopenia/ coagulopathy: Likely related cirrhosis and sepsis.  Now resolved ? ?6.  Anemia: Hemoglobin 7. 68m of darbo given on 4/15. Transfusions if needed ? ? ?7.  Afib with RVR- hep--> bivalirudin gtt -> now holding anticoagulation ? ?8.  Foot wounds: associated with high-dose vasopressor requirements. Vascular and orthopedic surgery onboard. Likely will need a right AKA vs BKA. May be able to salvage the left heel per ortho.  ? ?9. Hyperphosphatemia: started on sevelamer -> incr to 3 tabs TIDM ?  ?10. Dispo: Floor status.  May need to start process of arranging outpatient hemodialysis.  Rehabilitation will also be a factor ? ?Subjective:   ? ?Patient is in bed on dialysis confused but pleasant, spouse bedside ?  ? ?Objective:   ?BP (!) 107/53 (BP Location: Left Arm)   Pulse 96   Temp 98.9 ?F (37.2 ?C) (Oral)   Resp 20    Ht '5\' 10"'  (1.778 m)   Wt (!) 137.4 kg   SpO2 96%   BMI 43.48 kg/m?  ? ?Intake/Output Summary (Last 24 hours) at 03/13/2022 1316 ?Last data filed at 03/13/2022 0544 ?Gross per 24 hour  ?Intake 900 ml  ?Output 50 ml  ?Net 850 ml  ? ?Weight change: -1.814 kg ? ?Physical Exam: ?GEMAY:[id number] in no distress, a little more awake today tho ?HEENT eyes closed, no nasal discharge ?PULM bilateral chest rise with no increased work of breathing ?CV Normal rate, no audible rub ?ABD distended, soft, bowel sounds present ?GU: swollen scrotum w/ dry gangrene ?EXT LEE improved, mottled and cool bilateral soles of feet and toes, dry gangrene b/l  ?ACCESS: RIJ TC ? ?Imaging: ?No results found. ? ?Labs: ?BMET ?Recent Labs  ?Lab 03/07/22 ?0500 03/08/22 ?0530 03/09/22 ?0514234/18/23 ?0395324/19/23 ?0559 03/12/22 ?0134 03/13/22 ?050233?NA 136 134* 136 134* 137 136 134*  ?K 4.1 4.2 4.4 5.2* 4.3 4.3 4.2  ?CL 100 98 100 97* 98 97* 97*  ?CO2 21* 22 18* 17* 22 20* 21*  ?GLUCOSE 120* 108* 110* 129* 123* 131* 130*  ?BUN 75* 55*  79* 102* 64* 92* 66*  ?CREATININE 7.12* 6.49* 9.04* 11.60* 7.31* 9.44* 7.10*  ?CALCIUM 8.0* 8.0* 8.3* 8.0* 8.5* 8.4* 8.5*  ?PHOS 10.1* 8.5* 11.4*  --  8.9* 10.6* 8.4*  ? ?CBC ?Recent Labs  ?Lab 03/07/22 ?0500 03/09/22 ?0034 03/10/22 ?9611 03/11/22 ?0559 03/13/22 ?6435 03/13/22 ?3912  ?WBC 10.2   < > 12.4* 12.8* 10.0 9.5  ?NEUTROABS 7.8*  --  9.9*  --   --   --   ?HGB 7.0*   < > 7.1* 7.5* 6.0* 5.9*  ?HCT 22.2*   < > 23.1* 24.1* 19.8* 19.1*  ?MCV 94.1   < > 93.9 94.5 94.3 93.6  ?PLT 183   < > 330 360 311 295  ? < > = values in this interval not displayed.  ? ? ?Medications:   ? ? sodium chloride   Intravenous Once  ? aspirin EC  81 mg Oral Daily  ? chlorhexidine gluconate (MEDLINE KIT)  15 mL Mouth Rinse BID  ? Chlorhexidine Gluconate Cloth  6 each Topical Q0600  ? darbepoetin (ARANESP) injection - DIALYSIS  60 mcg Intravenous Q Sat-HD  ? dronabinol  2.5 mg Oral QAC lunch  ? feeding supplement  237 mL Oral QID  ?  feeding supplement (PROSource TF)  45 mL Per Tube QID  ? folic acid  1 mg Oral Daily  ? guaiFENesin  600 mg Oral BID  ? heparin injection (subcutaneous)  5,000 Units Subcutaneous Q8H  ? insulin aspart  0-15 Units Subcutaneous Q4H  ? leptospermum manuka honey  1 application. Topical Daily  ? loperamide HCl  1 mg Oral BID  ? mouth rinse  15 mL Mouth Rinse BID  ? melatonin  3 mg Oral QHS  ? metoprolol tartrate  37.5 mg Oral TID  ? multivitamin  1 tablet Oral QHS  ? pantoprazole  40 mg Oral BID  ? QUEtiapine  25 mg Oral BID  ? sevelamer carbonate  2,400 mg Oral TID WC  ? sodium chloride flush  10-40 mL Intracatheter Q12H  ? ? ? ?Jeffrey Ayala W ? ?03/13/2022, 1:16 PM   ?

## 2022-03-13 NOTE — H&P (View-Only) (Signed)
Patient ID: Jeffrey Ayala, male   DOB: 25-Apr-1961, 61 y.o.   MRN: [id number] ?Examination patient has improving tissue in the hindfoot on the left with dry gangrenous changes to the right foot.  Patient has dopplerable pulses to the ankle on the right palpable pulses on the left.  I discussed with the patient and his wife recommendation to proceed with a left midfoot amputation and a right below the knee amputation.  Risks and benefits were discussed postoperative care was discussed.  They state they understand wish to proceed at this time I will plan for surgery on Sunday morning at 730. ?

## 2022-03-13 NOTE — Progress Notes (Signed)
? ?  VASCULAR SURGERY ASSESSMENT & PLAN:  ? ?GANGRENE BOTH FEET: The patient actually looks a little stronger today.  I have discussed the case with [name], Dr. Anne Fu, and the patient's wife.  If he is felt to be an acceptable risk for surgery I believe [name] plans on operating on both legs next week.  I believe he is trying to salvage the left heel and on the right side he would likely have a below the knee amputation.  I will check back on Monday.  If there are any problems over the weekend Dr. Lenell Antu is on call. ? ?In addition, when he does have surgery we are trying to coordinate this with Dr. [name] who plans on debridement of the scrotum. ? ? ?SUBJECTIVE:  ? ?More alert this morning. ? ?PHYSICAL EXAM:  ? ?Vitals:  ? 03/13/22 0356 03/13/22 0400 03/13/22 0725 03/13/22 1047  ?BP: (!) 116/57  117/74 103/63  ?Pulse: 95  (!) 104 (!) 106  ?Resp: (!) 24  20 (!) 26  ?Temp: 99.4 ?F (37.4 ?C)  98.8 ?F (37.1 ?C) 99.1 ?F (37.3 ?C)  ?TempSrc: Oral  Oral Axillary  ?SpO2: 100%  100% 97%  ?Weight:  (!) 137.4 kg    ?Height:      ? ?No change in the dry gangrene to both feet. ? ?LABS:  ? ?Lab Results  ?Component Value Date  ? WBC 9.5 03/13/2022  ? HGB 5.9 (LL) 03/13/2022  ? HCT 19.1 (L) 03/13/2022  ? MCV 93.6 03/13/2022  ? PLT 295 03/13/2022  ? ?Lab Results  ?Component Value Date  ? CREATININE 7.10 (H) 03/13/2022  ? ?Lab Results  ?Component Value Date  ? INR 1.3 (H) 02/26/2022  ? ?CBG (last 3)  ?Recent Labs  ?  03/12/22 ?2340 03/13/22 ?0352 03/13/22 ?0815  ?GLUCAP 120* 117* 137*  ? ? ?PROBLEM LIST:   ? ?Principal Problem: ?  Septic shock ([name]) ?Active Problems: ?  Pressure injury of skin ?  Encounter for central line placement ?  Severe protein-calorie malnutrition ([name]) ?  Altered mental status ?  Gangrene of right foot ([name]) ?  Gangrene of left foot ([name]) ?  Malnutrition of moderate degree ? ? ?CURRENT MEDS:  ? ? sodium chloride   Intravenous Once  ? aspirin EC  81 mg Oral Daily  ? chlorhexidine gluconate (MEDLINE KIT)   15 mL Mouth Rinse BID  ? Chlorhexidine Gluconate Cloth  6 each Topical Q0600  ? darbepoetin (ARANESP) injection - DIALYSIS  60 mcg Intravenous Q Sat-HD  ? dronabinol  2.5 mg Oral QAC lunch  ? feeding supplement  237 mL Oral QID  ? feeding supplement (PROSource TF)  45 mL Per Tube QID  ? folic acid  1 mg Oral Daily  ? guaiFENesin  600 mg Oral BID  ? heparin injection (subcutaneous)  5,000 Units Subcutaneous Q8H  ? insulin aspart  0-15 Units Subcutaneous Q4H  ? leptospermum manuka honey  1 application. Topical Daily  ? loperamide HCl  1 mg Oral BID  ? mouth rinse  15 mL Mouth Rinse BID  ? melatonin  3 mg Oral QHS  ? metoprolol tartrate  37.5 mg Oral TID  ? multivitamin  1 tablet Oral QHS  ? pantoprazole  40 mg Oral BID  ? QUEtiapine  25 mg Oral BID  ? sevelamer carbonate  2,400 mg Oral TID WC  ? sodium chloride flush  10-40 mL Intracatheter Q12H  ? ? ?Waverly Ferrari ?Office: [phone] ?03/13/2022 ? ?

## 2022-03-13 NOTE — Progress Notes (Signed)
Occupational Therapy Treatment ?Patient Details ?Name: Jeffrey Ayala ?MRN: [id number] ?DOB: [birth year] ?Today's Date: 03/13/2022 ? ? ?History of present illness Pt adm 4/2 c/o N/V/D and found to have septic shock due to scrotal cellulitis vs RLL PNA.  Pt intubated 4/3 and brief cardiac arrest post intubation. [name] started 4/5.  Pt with AKI and started on CRRT on 4/6. Pt extubated 4/10. Pt also with afib with rvr. PMH - Bil TKR, migraines ?  ?OT comments ? Pt more alert today, able to follow one step commands consistently with multimodal cues. Emphasis on bed mobility, sitting balance EOB, and sequencing of basic ADLs. Pt continues to require extensive assist for ADLs/bed mobility. Encouraged family to assist in repositioning B LE in more neutral position to maintain joint mobility. Plan to progress UE strength, unsupported sitting balance and ADLs/transfers. Pending improvements in endurance, recommend AIR level therapies at DC as pt is significantly below functional baseline.    ? ?Recommendations for follow up therapy are one component of a multi-disciplinary discharge planning process, led by the attending physician.  Recommendations may be updated based on patient status, additional functional criteria and insurance authorization. ?   ?Follow Up Recommendations ? Acute inpatient rehab (3hours/day)  ?  ?Assistance Recommended at Discharge Frequent or constant Supervision/Assistance  ?Patient can return home with the following ? Two people to help with walking and/or transfers;Two people to help with bathing/dressing/bathroom;Assistance with cooking/housework;Assistance with feeding;Direct supervision/assist for medications management;Direct supervision/assist for financial management;Assist for transportation;Help with stairs or ramp for entrance ?  ?Equipment Recommendations ? Other (comment) (TBD pending progress)  ?  ?Recommendations for Other Services Rehab consult ? ?  ?Precautions / Restrictions  Precautions ?Precautions: Fall ?Precaution Comments: feet and scrotum with gangrene and blisters, flexiseal, cortrak ?Restrictions ?Weight Bearing Restrictions: No  ? ? ?  ? ?Mobility Bed Mobility ?Overal bed mobility: Needs Assistance ?Bed Mobility: Supine to Sit, Rolling, Sit to Sidelying ?Rolling: +2 for physical assistance, Max assist ?  ?Supine to sit: Max assist, +2 for physical assistance, +2 for safety/equipment, HOB elevated ?  ?Sit to sidelying: +2 for physical assistance, Max assist ?General bed mobility comments: Pt assisting moving legs off of bed. Assist to elevate trunk into sitting and bring hips to EOB. Assist to lower trunk and bring feet back up in bed. ?  ? ?Transfers ?  ?  ?  ?  ?  ?  ?  ?  ?  ?  ?  ?  ?Balance Overall balance assessment: Needs assistance ?Sitting-balance support: Bilateral upper extremity supported, Feet supported ?Sitting balance-Leahy Scale: Poor ?Sitting balance - Comments: Pt sat EOB x 15 minutes with min guard initially and progressing to mod assist as pt fatigued ?Postural control: Posterior lean ?  ?  ?  ?  ?  ?  ?  ?  ?  ?  ?  ?  ?  ?  ?   ? ?ADL either performed or assessed with clinical judgement  ? ?ADL Overall ADL's : Needs assistance/impaired ?  ?  ?Grooming: Maximal assistance;Sitting;Wash/dry face;Brushing hair ?Grooming Details (indicate cue type and reason): difficulty with elbow flexion to reach face with R dominant UE. Able to reach with L UE to wipe eyes though difficulty sustaining due to fatigue. Total A to comb hair ?  ?  ?  ?  ?  ?  ?  ?  ?  ?  ?Toileting- Clothing Manipulation and Hygiene: Total assistance;+2 for physical assistance;Bed level ?Toileting - Water quality scientist  Details (indicate cue type and reason): d/t incontinence and flexiseal leaking ?  ?  ?  ?General ADL Comments: Improving alertness, following of directions during session though continues to require extensive assist for ADLs/bed mobility, fatigued easily ?  ? ?Extremity/Trunk  Assessment Upper Extremity Assessment ?Upper Extremity Assessment: RUE deficits/detail ?RUE Deficits / Details: cyst on elbow. wife reports this is baseline. however, pt with noted grimacing with elbow flexion and unable to reach to face. Pt's wife also reports likely need for shoulder replacement though shoulder AROM WFL ?  ?Lower Extremity Assessment ?Lower Extremity Assessment: Defer to PT evaluation ?  ?  ?  ? ?Vision   ?Vision Assessment?: No apparent visual deficits ?  ?Perception   ?  ?Praxis   ?  ? ?Cognition Arousal/Alertness: Awake/alert (intermittent cues to stay alert as pt fatigued) ?Behavior During Therapy: Flat affect ?Overall Cognitive Status: Impaired/Different from baseline ?Area of Impairment: Attention, Memory, Following commands, Awareness, Problem solving, Orientation, Safety/judgement ?  ?  ?  ?  ?  ?  ?  ?  ?Orientation Level: Disoriented to, Place, Situation, Time (pt unable to answer any orientation questions) ?Current Attention Level: Sustained ?Memory: Decreased short-term memory, Decreased recall of precautions ?Following Commands: Follows one step commands with increased time, Follows one step commands inconsistently ?Safety/Judgement: Decreased awareness of safety, Decreased awareness of deficits ?Awareness: Intellectual ?Problem Solving: Slow processing, Decreased initiation, Difficulty sequencing, Requires verbal cues, Requires tactile cues ?General Comments: Pt more alert than on prior visits. Engaged and able to participate with activities. Follows one step commands with increased time, multimodal cues. ?  ?  ?   ?Exercises   ? ?  ?Shoulder Instructions   ? ? ?  ?General Comments VSS on RA. Wife present and supportive. Encouraged optimal positioning of B LE in bed to avoid external rotation contractures  ? ? ?Pertinent Vitals/ Pain       Pain Assessment ?Pain Assessment: Faces ?Faces Pain Scale: Hurts little more ?Pain Location: ?rt elbow with flexion ?Pain Descriptors /  Indicators: Grimacing ?Pain Intervention(s): Monitored during session ? ?Home Living   ?  ?  ?  ?  ?  ?  ?  ?  ?  ?  ?  ?  ?  ?  ?  ?  ?  ?  ? ?  ?Prior Functioning/Environment    ?  ?  ?  ?   ? ?Frequency ? Min 2X/week  ? ? ? ? ?  ?Progress Toward Goals ? ?OT Goals(current goals can now be found in the care plan section) ? Progress towards OT goals: Progressing toward goals ? ?Acute Rehab OT Goals ?Patient Stated Goal: wife would like for pt to regain as much strength and independence as possible ?OT Goal Formulation: With patient ?Time For Goal Achievement: 03/17/22 ?Potential to Achieve Goals: Good ?ADL Goals ?Pt Will Perform Grooming: with min assist;sitting;bed level ?Pt Will Perform Upper Body Bathing: with min assist;sitting;bed level ?Pt Will Transfer to Toilet: with max assist;with +2 assist;stand pivot transfer;squat pivot transfer;bedside commode ?Pt/caregiver will Perform Home Exercise Program: Increased strength;Both right and left upper extremity;With written HEP provided ?Additional ADL Goal #1: Pt will attend to and complete ADL task with no more than minimal cueing to complete.  ?Plan Discharge plan remains appropriate;Frequency remains appropriate   ? ?Co-evaluation ? ? ? PT/OT/SLP Co-Evaluation/Treatment: Yes ?Reason for Co-Treatment: Complexity of the patient's impairments (multi-system involvement);Necessary to address cognition/behavior during functional activity;For patient/therapist safety;To address functional/ADL transfers ?PT goals addressed during  session: Mobility/safety with mobility;Balance ?OT goals addressed during session: ADL's and self-care;Strengthening/ROM ?  ? ?  ?AM-PAC OT "6 Clicks" Daily Activity     ?Outcome Measure ? ? Help from another person eating meals?: A Lot ?Help from another person taking care of personal grooming?: A Lot ?Help from another person toileting, which includes using toliet, bedpan, or urinal?: Total ?Help from another person bathing (including washing,  rinsing, drying)?: Total ?Help from another person to put on and taking off regular upper body clothing?: Total ?Help from another person to put on and taking off regular lower body clothing?: Total ?6 Click Score:

## 2022-03-13 NOTE — Progress Notes (Signed)
Pharmacy Antibiotic Note ? ?Jeffrey Ayala is a 61 y.o. male admitted on 02/22/2022 with sepsis.  Pharmacy has been consulted for vancomycin and cefepime dosing. ? ?HD schedule TTS for now.  Next planned Sat. Received vancomycin dose appropriately after HD yesterday. ? ?Plan: ?Vancomycin 1g q HD. ?Cefepime 1g IV q 24 hrs. ? ?Height: '5\' 10"'$  (177.8 cm) ?Weight: (!) 137.4 kg (303 lb) ?IBW/kg (Calculated) : 73 ? ?Temp (24hrs), Avg:98.4 ?F (36.9 ?C), Min:97.8 ?F (36.6 ?C), Max:99.4 ?F (37.4 ?C) ? ?Recent Labs  ?Lab 03/09/22 ?0174 03/10/22 ?9449 03/10/22 ?1808 03/11/22 ?0559 03/12/22 ?0134 03/13/22 ?6759 03/13/22 ?1638  ?WBC 10.8* 12.4*  --  12.8*  --  10.0 9.5  ?CREATININE 9.04* 11.60*  --  7.31* 9.44* 7.10*  --   ?LATICACIDVEN  --   --  1.3  --   --   --   --   ? ?  ?Estimated Creatinine Clearance: 15.3 mL/min (A) (by C-G formula based on SCr of 7.1 mg/dL (H)).   ? ?Allergies  ?Allergen Reactions  ? Codeine Hives  ?  Insomnia, jittery  ? Flomax [Tamsulosin Hcl] Nausea Only  ?  Nausea and fatigue  ? ? ?Antimicrobials this admission: ?4/2 flagyl x 1 ?4/2 vanc >> 4/5; 4/18>>  ?4/2 cefepime >> 4/5; 4/18>>  ?4/5 linezolid >> 4/7 ?4/5 pip/tazo >> 4/14 ? ?Dose adjustments this admission: ?4/4 VR 18 ?4/5 VR 15 ? ?Microbiology results: ?4/2 Urine: multiple species ?4/2 Blood: neg ?4/6 trach aspirate: rare Candida albicans ?4/2 C diff: neg ?4/2 MRSA PCR: neg ?4/2 COVID and flu: neg ?4/6 Trach asp- rare GPC, rare GVR, rare candida ?4/17 bcx: ngtd  ?4/18 Bcx ngtd ? ?Thank you for allowing pharmacy to be a part of this patient?s care. ? ?[place] Crane, Pharm D, BCPS, BCCP ?Clinical Pharmacist ? 03/13/2022 11:50 AM  ? ?[facility] pharmacy phone numbers are listed on [email or web address] ? ? ?

## 2022-03-13 NOTE — Progress Notes (Signed)
Patient ID: Jeffrey Ayala, male   DOB: August 05, 1961, 61 y.o.   MRN: [id number] ?Examination patient has improving tissue in the hindfoot on the left with dry gangrenous changes to the right foot.  Patient has dopplerable pulses to the ankle on the right palpable pulses on the left.  I discussed with the patient and his wife recommendation to proceed with a left midfoot amputation and a right below the knee amputation.  Risks and benefits were discussed postoperative care was discussed.  They state they understand wish to proceed at this time I will plan for surgery on Sunday morning at 730. ?

## 2022-03-13 NOTE — Progress Notes (Signed)
?PROGRESS NOTE ? ?Jeffrey Ayala  ?DOB: [birth year]  ?PCP: Patient, No Pcp Per (Inactive) ?ASN:[id number]  ?[name]: 02/22/2022 ? LOS: 19 days  ?Hospital Day: 20 ? ?Brief narrative: ?Jeffrey Ayala is a 61 y.o. male with PMH significant for arthritis, bilateral knee replacement, gastric ulcer, kidney stones, eosinophilic esophagitis, migraines not on any regular meds.   ?Patient presented to the ED on 4/2 with complaint of nausea, vomiting, diarrhea and confusion for 24 hours.   ?EMS noted him to be in A-fib with a heart rate of 120-160 bpm. ?In the ED, he had a fever of 102.9. ?CT abdomen pelvis showed swollen scrotum concerning for infection, colon filled with diarrhea.  ?He was hypotensive requiring multiple vasopressors in the ICU.   ?4/3, patient required intubation and also had a brief cardiac arrest postintubation.   ?4/5, in an effort to correct vasoplegia, patient was also started on [name] ?Because of septic shock, patient's renal function worsened. ?4/6, patient was started on CRRT ?4/11, patient was extubated. ?4/14, patient was transferred out to [facility] service. ? ?Subjective: ?Patient was seen and examined this morning.  ?He was being worked up by physical therapy.  Wife at bedside. ?Per wife, Seroquel last night might have helped.  He slept well last night and is more awake alert this morning and is able to participate with physical therapy.  She wants to skip morning dose of Seroquel which is fine. ?She is waiting to discuss with Dr. Scot Dock and [name] about the ultimate plan and extent of amputation. ?NG tube feeding ongoing. ?Labs from this morning with hemoglobin down from 7.5 yesterday to 6 today.  No clear source of bleeding. ?Pending repeat hemoglobin this morning. ? ?Principal Problem: ?  Septic shock ([place]) ?Active Problems: ?  Pressure injury of skin ?  Encounter for central line placement ?  Severe protein-calorie malnutrition ([place]) ?  Altered mental status ?  Gangrene of right  foot ([place]) ?  Gangrene of left foot ([place]) ?  Malnutrition of moderate degree ?  ?Assessment and Plan: ?Septic shock - POA ?-Patient was in septic shock on admission, thought due to scrotal cellulitis and possible aspiration pneumonia from n/v. ?-While in ICU, patient required multiple vasopressors including Levophed, epinephrine and vasopressin.  Also given a trial of methylene blue.  Eventually taken off pressors. ?-Blood culture did not show any growth.  Urine culture with multiple species.  Completed a course of antibiotics. ?-Urology follow-up appreciated.  Noted a plan of scrotum debridement tentatively tomorrow timed with vascular surgery. ? ?New SIRS ?-4/18, patient spiked a fever of 102.4 with tachycardia.  Lactic acid level was normal. ?-Unclear cause of fever but high suspicion of an infectious etiology. ?-Chest x-ray not remarkable for pneumonia.  ?-Blood culture sent again.  No growth so far. ?-Restarted on a course of broad-spectrum antibiotics with IV cefepime and vancomycin again on 4/18. ?-No recurrence of fever since 4/18. Continue to monitor. ?Recent Labs  ?Lab 03/07/22 ?0500 03/09/22 ?1937 03/10/22 ?9024 03/10/22 ?1808 03/11/22 ?0559 03/13/22 ?0973  ?WBC 10.2 10.8* 12.4*  --  12.8* 10.0  ?LATICACIDVEN  --   --   --  1.3  --   --   ? ?Diarrhea ?-Per wife, patient continues to have diarrhea since admission.  Currently has a Flexi-Seal in place. ?-4/17, GI pathogen panel negative. ?-Continues to have liquid stool.  On 4/20, I started him on low-dose Imodium scheduled twice daily.  Continue to monitor output. ? ?Acute anemia ?-Baseline hemoglobin  normal.  Gradually downtrending hemoglobin level this hospitalization.  For the last several days, his hemoglobin remained between 7 and 8.  Hemoglobin dropped this morning to 6.  No evidence of active bleeding.  Repeat hemoglobin this morning.  Transfuse if less than 7.  So far patient had not required any PRBC transfusion this hospitalization. ?Recent Labs   ?  03/07/22 ?0500 03/09/22 ?9242 03/10/22 ?6834 03/11/22 ?0559 03/13/22 ?1962  ?HGB 7.0* 7.1* 7.1* 7.5* 6.0*  ?MCV 94.1 92.5 93.9 94.5 94.3  ? ?Acute metabolic encephalopathy ?-Patient continues to have altered mental status, drowsiness, excessive sleepiness although with intermittent slight improvement.  Multifactorial: Sepsis, uremia, possible hypoxemic injury during septic shock.  ?-No evidence of intracranial infection. 4/15, CT head unremarkable. ?-Ammonia level unremarkable. ?-Currently on Seroquel 25 mg twice daily.  Wife wants to skip the morning dose of Seroquel today.  Can give later in the day if he becomes restless or agitated. ? ?AKI from septic shock -new dialysis dependence ?-Started CRRT 4/64/11, now on iHD ?-4/17, underwent TLC catheter placed ?-Nephrology following. No urine output. ? ?Dry gangrene of both feet ?-Secondary to vasospasm due to high-dose of vasopressors required for septic shock. ?-His wounds in both feet are continuing to demarcate.  Patient may ultimately require bilateral AKA  ?-Vascular surgery and orthopedics following. ?-She is waiting to discuss with Dr. Scot Dock and [name] about the ultimate plan and extent of amputation. ? ?New onset of afib, presents on admission ?-echocardiogram with preserved lvef ?-remains in rate controlled afib ?-Was on heparin drip initially but it was stopped because of drop in hemoglobin. ?-Cardiology consulted. Currently on metoprolol 37.5 mg 3 times daily and aspirin 81 mg daily.  Heart rate remains mostly above 100. ?-If hemoglobin improves, will consider Eliquis at discharge. ?-Per cardiology, plan for outpatient DCCV if remains in A-fib and able to tolerate AC. ? ?Shock liver ?-Liver enzymes elevated likely due to shock.  Improving numbers.   ?-CT abd did not show hepatobiliary abnormality ?-Repeat liver enzymes tomorrow. ?Recent Labs  ?Lab 03/07/22 ?0500 03/08/22 ?0530 03/09/22 ?2297 03/10/22 ?9892 03/11/22 ?0559 03/12/22 ?0134  03/13/22 ?1194  ?AST 46*  --   --  29  --   --   --   ?ALT 78*  --   --  41  --   --   --   ?ALKPHOS 266*  --   --  183*  --   --   --   ?BILITOT 3.4*  --   --  2.9*  --   --   --   ?PROT 6.1*  --   --  6.9  --   --   --   ?ALBUMIN 2.2*  2.2*   < > 2.3* 2.3* 2.3* 2.2* 2.1*  ? < > = values in this interval not displayed.  ? ?Acute hypoxic respiratory failure ?-Initially required intubation, later extubated.  Currently on room air. ? ?Elevated troponin ?-Likely because of brief cardiac arrest and septic shock. ?-Echo showed EF of 60 to 65% with normal wall motion. ?-No further ischemic evaluation at this time per cardiology. ? ?Morbid obesity  ?-Body mass index is 43.48 kg/m?Marland Kitchen Patient has been advised to make an attempt to improve diet and exercise patterns to aid in weight loss. ? ?Poor oral intake ?-Due to altered mentation.  Dietitian consult appreciated.  Currently getting tube feeding through core track.. ? ?Goals of care ?  Code Status: Full Code  ? ? ?Mobility: Encourage PT participation once  mental status improves ? ?Skin assessment:  ?Pressure Injury 02/22/22 Coccyx Medial Stage 2 -  Partial thickness loss of dermis presenting as a shallow open injury with a red, pink wound bed without slough. pink, painful (Active)  ?02/22/22 2330  ?Location: Coccyx  ?Location Orientation: Medial  ?Staging: Stage 2 -  Partial thickness loss of dermis presenting as a shallow open injury with a red, pink wound bed without slough.  ?Wound Description (Comments): pink, painful  ?Present on Admission: Yes  ? ? ?Nutritional status:  ?Body mass index is 43.48 kg/m?Marland Kitchen  ?Nutrition Problem: Moderate Malnutrition ?Etiology: acute illness (septic shock, A fib, AKI) ?Signs/Symptoms: mild fat depletion, mild muscle depletion, moderate muscle depletion ? ? ? ? ?Diet:  ?Diet Order   ? ?       ?  Diet regular Room service appropriate? Yes; Fluid consistency: Thin  Diet effective now       ?  ? ?  ?  ? ?  ? ? ?DVT prophylaxis:  ?heparin  injection 5,000 Units Start: 03/10/22 0600 ?SCDs Start: 02/22/22 2249 ?  ?Antimicrobials: IV cefepime and vancomycin since 4/18 ?Fluid: None ?Consultants: Nephrology, cardiology ?Family Communication: Wife at bedside ? ?

## 2022-03-13 NOTE — Progress Notes (Signed)
HBG 6.0 Wyman Songster.[place] and text paged Dr. Velia Meyer lab result. ?

## 2022-03-13 NOTE — Progress Notes (Addendum)
? ?Progress Note ? ?Patient Name: Jeffrey Ayala ?Date of Encounter: 03/13/2022 ? ?[facility] Cardiologist: Candee Furbish, MD  ? ?Subjective  ? ?Hemoglobin 6.  Getting transfused 1 unit.  Tolerated hemodialysis yesterday.  Still encephalopathic.  Was having trouble remembering his full name yesterday.  This morning when talking to his wife, she said he did remember the name of the dog. ? ?Inpatient Medications  ?  ?Scheduled Meds: ? sodium chloride   Intravenous Once  ? aspirin EC  81 mg Oral Daily  ? chlorhexidine gluconate (MEDLINE KIT)  15 mL Mouth Rinse BID  ? Chlorhexidine Gluconate Cloth  6 each Topical Q0600  ? darbepoetin (ARANESP) injection - DIALYSIS  60 mcg Intravenous Q Sat-HD  ? dronabinol  2.5 mg Oral QAC lunch  ? feeding supplement  237 mL Oral QID  ? feeding supplement (PROSource TF)  45 mL Per Tube QID  ? folic acid  1 mg Oral Daily  ? guaiFENesin  600 mg Oral BID  ? heparin injection (subcutaneous)  5,000 Units Subcutaneous Q8H  ? insulin aspart  0-15 Units Subcutaneous Q4H  ? leptospermum manuka honey  1 application. Topical Daily  ? loperamide HCl  1 mg Oral BID  ? mouth rinse  15 mL Mouth Rinse BID  ? melatonin  3 mg Oral QHS  ? metoprolol tartrate  37.5 mg Oral TID  ? multivitamin  1 tablet Oral QHS  ? pantoprazole  40 mg Oral BID  ? QUEtiapine  25 mg Oral BID  ? sevelamer carbonate  2,400 mg Oral TID WC  ? sodium chloride flush  10-40 mL Intracatheter Q12H  ? ?Continuous Infusions: ? sodium chloride Stopped (03/06/22 2355)  ? ceFEPime (MAXIPIME) IV 1 g (03/12/22 2138)  ? feeding supplement (NEPRO CARB STEADY) 1,000 mL (03/12/22 1811)  ? vancomycin 1,000 mg (03/12/22 1557)  ? ?PRN Meds: ?acetaminophen, docusate sodium, heparin sodium (porcine), metoprolol tartrate, ondansetron (ZOFRAN) IV, phenol, polyethylene glycol, sodium chloride flush, white petrolatum  ? ?Vital Signs  ?  ?Vitals:  ? 03/12/22 2334 03/13/22 0356 03/13/22 0400 03/13/22 0725  ?BP: 118/61 (!) 116/57  117/74  ?Pulse: 92 95   (!) 104  ?Resp: (!) 21 (!) 24  20  ?Temp: 97.8 ?F (36.6 ?C) 99.4 ?F (37.4 ?C)  98.8 ?F (37.1 ?C)  ?TempSrc: Oral Oral  Oral  ?SpO2: 100% 100%  100%  ?Weight:   (!) 137.4 kg   ?Height:      ? ? ?Intake/Output Summary (Last 24 hours) at 03/13/2022 0928 ?Last data filed at 03/13/2022 0544 ?Gross per 24 hour  ?Intake 900 ml  ?Output 1050 ml  ?Net -150 ml  ? ? ?  03/13/2022  ?  4:00 AM  ?Last 3 Weights  ?Weight (lbs) 303 lb  ?Weight (kg) 137.44 kg  ?   ? ?Telemetry  ?  ?Atrial fibrillation 100-120- Personally Reviewed ? ?ECG  ?  ?No new ECG tracing today. - Personally Reviewed ? ?Physical Exam  ? ?GEN: No acute distress.   ?Neck: No JVD ?Cardiac: Irregularly irregular, mildly tachycardic. No murmurs, rubs, or gallops.  ?Respiratory: Clear to auscultation anteriorly.   ?GI: Soft, non-distended, and non-tender. ?MS: No lower extremity edema.  ?Skin: Warm and dry. Gangrenous bilateral feet (right > left).  Darkened skin. ?Neuro: Once again alert but not oriented.  No focal deficits noted. ?Psych: Encephalopathic.  ? ?Labs  ?  ?High Sensitivity Troponin:   ?Recent Labs  ?Lab 02/22/22 ?1805 02/22/22 ?2007 02/23/22 ?6606 02/23/22 ?  [place]* 1,834* 2,807* 3,550*  ?   ?Chemistry ?Recent Labs  ?Lab 03/07/22 ?0500 03/08/22 ?0530 03/09/22 ?5638 03/10/22 ?7564 03/11/22 ?0559 03/12/22 ?0134 03/13/22 ?3329  ?NA 136 134*   < > 134* 137 136 134*  ?K 4.1 4.2   < > 5.2* 4.3 4.3 4.2  ?CL 100 98   < > 97* 98 97* 97*  ?CO2 21* 22   < > 17* 22 20* 21*  ?GLUCOSE 120* 108*   < > 129* 123* 131* 130*  ?BUN 75* 55*   < > 102* 64* 92* 66*  ?CREATININE 7.12* 6.49*   < > 11.60* 7.31* 9.44* 7.10*  ?CALCIUM 8.0* 8.0*   < > 8.0* 8.5* 8.4* 8.5*  ?MG 2.7* 2.5*  --   --   --   --   --   ?PROT 6.1*  --   --  6.9  --   --   --   ?ALBUMIN 2.2*  2.2* 2.2*   < > 2.3* 2.3* 2.2* 2.1*  ?AST 46*  --   --  29  --   --   --   ?ALT 78*  --   --  41  --   --   --   ?ALKPHOS 266*  --   --  183*  --   --   --   ?BILITOT 3.4*  --   --  2.9*  --   --   --    ?GFRNONAA 8* 9*   < > 5* 8* 6* 8*  ?ANIONGAP 15 14   < > 20* 17* 19* 16*  ? < > = values in this interval not displayed.  ?  ?Lipids No results for input(s): CHOL, TRIG, HDL, LABVLDL, LDLCALC, CHOLHDL in the last 168 hours.  ?Hematology ?Recent Labs  ?Lab 03/10/22 ?5188 03/11/22 ?0559 03/13/22 ?4166  ?WBC 12.4* 12.8* 10.0  ?RBC 2.46* 2.55* 2.10*  ?HGB 7.1* 7.5* 6.0*  ?HCT 23.1* 24.1* 19.8*  ?MCV 93.9 94.5 94.3  ?MCH 28.9 29.4 28.6  ?MCHC 30.7 31.1 30.3  ?RDW 16.4* 16.3* 16.2*  ?PLT 330 360 311  ? ?Thyroid No results for input(s): TSH, FREET4 in the last 168 hours.  ?BNPNo results for input(s): BNP, PROBNP in the last 168 hours.  ?DDimer No results for input(s): DDIMER in the last 168 hours.  ? ?Radiology  ?  ?No results found. ? ?Cardiac Studies  ? ?Echocardiogram 02/23/2022: ?Impressions:1. Left ventricular ejection fraction, by estimation, is 60 to 65%. The  ?left ventricle has normal function. The left ventricle has no regional  ?wall motion abnormalities. There is mild left ventricular hypertrophy.  ?Left ventricular diastolic function  ?could not be evaluated.  ? 2. Right ventricular systolic function is normal. The right ventricular  ?size is normal.  ? 3. The mitral valve is normal in structure. No evidence of mitral valve  ?regurgitation. No evidence of mitral stenosis.  ? 4. The aortic valve is tricuspid. Aortic valve regurgitation is not  ?visualized. No aortic stenosis is present.  ? 5. The inferior vena cava is dilated in size with <50% respiratory  ?variability, suggesting right atrial pressure of 15 mmHg.  ? ?Comparison(s): No prior Echocardiogram. ?_______________ ?  ?  ?ABIs/TBIs 03/04/2022: ?Summary: ?Right: Resting right ankle-brachial index indicates noncompressible right  ?lower extremity arteries.  ? ?Left: Resting left ankle-brachial index indicates noncompressible left  ?lower extremity arteries. The left toe-brachial index is abnormal. ? ?Normal ABIs. ? ?Patient Profile  ?   ?  61 y.o. male  gastric ulcers, eosinophilic esophagitis, arthritis, and migraines who was admitted on 02/22/2022 with septic shock after presenting with nausea, vomiting, and diarrhea. Abdominal/pelvic CT was notable for swollen scrotum concerning for infection and colon filled with diarrhea. No clear etiology but thought to be due to scrotal cellulitis and possible aspiration pneumonia. Urology was consulted and has been following. He was started on pressors, IV fluids, and IV antibiotics. He was also noted to be in atrial fibrillation with RVR on admission which was felt to likely be secondary to sepsis. Hospitalization has been complicated by acute hypoxic respiratory arrest requiring intubation on 4/3 (extubated 4/11), brief cardiac arrest post-intubation, and AKI requiring CRRT and now intermittent hemodialysis, acute metabolic encephalopathy, and bilateral foot wounds/gangrene felt to be secondary to pressors. Cardiology consulted for assistance of atrial fibrillation.  ? ?Assessment & Plan  ?  ?New Onset Atrial Fibrillation ?Initially in the setting of septic shock. Remains in atrial fibrillation. Rates in the 120s during dialysis. Otherwise, looks like rates are in the 100s. Echo showed normal LV function. ?- Continue Lopressor to 37.5 mg twice daily. BP soft during dialysis so unable to up-titrate.  No changes again. ?- CHA2DS2-VASc = 0-1 (aortic and coronary artery calcifications noted on CT). Was on IV Heparin for VTE prophylaxis but this was stopped on 03/07/2022 due to drop in hemoglobin.  He does have a history of gastric ulcers. Hemoglobin 7.5 yesterday, however earlier this morning was 6.0.  Requiring transfusion once again.  If hemoglobin stabilizes eventually, could consider Eliquis but would be very careful for this.  Would avoid anticoagulation for now. ?- Possible plan for outpatient DCCV if he remains in atrial fibrillation with resolution of underlying illnesses and is able to tolerate anticoagulation.  ?   ?Elevated Troponin ?Cardiac Arrest ?Patient had a brief cardiac arrest post-intubation on 4/3. Per note from 4/3, he became pulses a few minutes after intubation and was resuscitated with 1 amp of epi and 1 amp of

## 2022-03-13 NOTE — Progress Notes (Signed)
Patient ID: Jeffrey Ayala, male   DOB: May 16, 1961, 61 y.o.   MRN: [id number] ? ?  ?Subjective: ?Pt somnolent this morning.   ? ?Objective: ?Vital signs in last 24 hours: ?Temp:  [97.8 ?F (36.6 ?C)-99.4 ?F (37.4 ?C)] 99.4 ?F (37.4 ?C) (04/21 0356) ?Pulse Rate:  [92-127] 95 (04/21 0356) ?Resp:  [18-29] 24 (04/21 0356) ?BP: (94-149)/(57-112) 116/57 (04/21 0356) ?SpO2:  [93 %-100 %] 100 % (04/21 0356) ?Weight:  [137.4 kg] 137.4 kg (04/21 0400) ? ?Intake/Output from previous day: ?04/20 0701 - 04/21 0700 ?In: 900 [NG/GT:800; IV Piggyback:100] ?Out: 1050 [Stool:50] ?Intake/Output this shift: ?No intake/output data recorded. ? ?Physical Exam:  ?General: Alert and oriented ?GU: Exam unchanged from Wednesday ? ?Lab Results: ?Recent Labs  ?  03/11/22 ?0559 03/13/22 ?7741  ?HGB 7.5* 6.0*  ?HCT 24.1* 19.8*  ? ? ?  Latest Ref Rng & Units 03/13/2022  ?  5:55 AM 03/11/2022  ?  5:59 AM 03/10/2022  ?  3:37 AM  ?CBC  ?WBC 4.0 - 10.5 K/uL 10.0   12.8   12.4    ?Hemoglobin 13.0 - 17.0 g/dL 6.0   7.5   7.1    ?Hematocrit 39.0 - 52.0 % 19.8   24.1   23.1    ?Platelets 150 - 400 K/uL 311   360   330    ? ? ? ?BMET ?Recent Labs  ?  03/12/22 ?0134 03/13/22 ?2878  ?NA 136 134*  ?K 4.3 4.2  ?CL 97* 97*  ?CO2 20* 21*  ?GLUCOSE 131* 130*  ?BUN 92* 66*  ?CREATININE 9.44* 7.10*  ?CALCIUM 8.4* 8.5*  ? ? ? ?Studies/Results: ?No results found. ? ?Assessment/Plan: ?Will definitely benefit from debridement of superficial scrotal skin to assist with wound healing.  Not urgent at this time and have discussed with Dr. Scot Dock.  Hope to coordinate this with a single anesthetic once plan for lower extremities is finalized with Dr. Scot Dock and [name].  Possibly next week? ? ? LOS: 19 days  ? ?[name] ?03/13/2022, 7:49 AM ? ?  ?

## 2022-03-13 NOTE — Progress Notes (Signed)
[facility] night cross cover note: ? ?I was notified of patient's hemoglobin of 6.0 this morning.  This is in the setting of acute anemia during this hospitalization in which the patient has required initiation of hemodialysis.  Hemoglobin over the last several days has been in the range of 7-8, although the patient has not yet required PRBC transfusion during this hospitalization.  Most recent prior hemoglobin noted to be 7.5 yesterday morning.  Per my review of vital signs, most recent blood pressure 116/57, heart rate 100. ? ?Per my review of most recent nephrology note, dated 03/12/2022, next scheduled hemodialysis session is set to occur on Saturday, 03/14/2022, and nephrology recommends as needed prbc transfusion for patient's acute anemia.  ? ?I subsequently placed order to transfuse 1 unit PRBC slowly, over 3 hours.  ? ? ? ?Babs Bertin, DO ?Hospitalist ? ?

## 2022-03-14 DIAGNOSIS — A419 Sepsis, unspecified organism: Secondary | ICD-10-CM | POA: Diagnosis not present

## 2022-03-14 DIAGNOSIS — R6521 Severe sepsis with septic shock: Secondary | ICD-10-CM | POA: Diagnosis not present

## 2022-03-14 LAB — COMPREHENSIVE METABOLIC PANEL
ALT: 29 U/L (ref 0–44)
AST: 41 U/L (ref 15–41)
Albumin: 2.1 g/dL — ABNORMAL LOW (ref 3.5–5.0)
Alkaline Phosphatase: 165 U/L — ABNORMAL HIGH (ref 38–126)
Anion gap: 18 — ABNORMAL HIGH (ref 5–15)
BUN: 97 mg/dL — ABNORMAL HIGH (ref 8–23)
CO2: 18 mmol/L — ABNORMAL LOW (ref 22–32)
Calcium: 8.3 mg/dL — ABNORMAL LOW (ref 8.9–10.3)
Chloride: 96 mmol/L — ABNORMAL LOW (ref 98–111)
Creatinine, Ser: 8.81 mg/dL — ABNORMAL HIGH (ref 0.61–1.24)
GFR, Estimated: 6 mL/min — ABNORMAL LOW (ref >60)
Glucose, Bld: 130 mg/dL — ABNORMAL HIGH (ref 70–99)
Potassium: 4.5 mmol/L (ref 3.5–5.1)
Sodium: 132 mmol/L — ABNORMAL LOW (ref 135–145)
Total Bilirubin: 2.4 mg/dL — ABNORMAL HIGH (ref 0.3–1.2)
Total Protein: 6.8 g/dL (ref 6.5–8.1)

## 2022-03-14 LAB — RENAL FUNCTION PANEL
Albumin: 2.1 g/dL — ABNORMAL LOW (ref 3.5–5.0)
Anion gap: 19 — ABNORMAL HIGH (ref 5–15)
BUN: 96 mg/dL — ABNORMAL HIGH (ref 8–23)
CO2: 18 mmol/L — ABNORMAL LOW (ref 22–32)
Calcium: 8.3 mg/dL — ABNORMAL LOW (ref 8.9–10.3)
Chloride: 96 mmol/L — ABNORMAL LOW (ref 98–111)
Creatinine, Ser: 8.71 mg/dL — ABNORMAL HIGH (ref 0.61–1.24)
GFR, Estimated: 6 mL/min — ABNORMAL LOW (ref >60)
Glucose, Bld: 130 mg/dL — ABNORMAL HIGH (ref 70–99)
Phosphorus: 9.9 mg/dL — ABNORMAL HIGH (ref 2.5–4.6)
Potassium: 4.5 mmol/L (ref 3.5–5.1)
Sodium: 133 mmol/L — ABNORMAL LOW (ref 135–145)

## 2022-03-14 LAB — CBC WITH DIFFERENTIAL/PLATELET
Abs Immature Granulocytes: 0.17 10*3/uL — ABNORMAL HIGH (ref 0.00–0.07)
Basophils Absolute: 0.1 10*3/uL (ref 0.0–0.1)
Basophils Relative: 1 %
Eosinophils Absolute: 0 10*3/uL (ref 0.0–0.5)
Eosinophils Relative: 0 %
HCT: 23 % — ABNORMAL LOW (ref 39.0–52.0)
Hemoglobin: 7.4 g/dL — ABNORMAL LOW (ref 13.0–17.0)
Immature Granulocytes: 2 %
Lymphocytes Relative: 10 %
Lymphs Abs: 1.1 10*3/uL (ref 0.7–4.0)
MCH: 29.1 pg (ref 26.0–34.0)
MCHC: 32.2 g/dL (ref 30.0–36.0)
MCV: 90.6 fL (ref 80.0–100.0)
Monocytes Absolute: 0.8 10*3/uL (ref 0.1–1.0)
Monocytes Relative: 8 %
Neutro Abs: 8.3 10*3/uL — ABNORMAL HIGH (ref 1.7–7.7)
Neutrophils Relative %: 79 %
Platelets: 293 10*3/uL (ref 150–400)
RBC: 2.54 MIL/uL — ABNORMAL LOW (ref 4.22–5.81)
RDW: 16.1 % — ABNORMAL HIGH (ref 11.5–15.5)
WBC: 10.5 10*3/uL (ref 4.0–10.5)
nRBC: 0 % (ref 0.0–0.2)

## 2022-03-14 LAB — GLUCOSE, CAPILLARY
Glucose-Capillary: 105 mg/dL — ABNORMAL HIGH (ref 70–99)
Glucose-Capillary: 109 mg/dL — ABNORMAL HIGH (ref 70–99)
Glucose-Capillary: 117 mg/dL — ABNORMAL HIGH (ref 70–99)
Glucose-Capillary: 120 mg/dL — ABNORMAL HIGH (ref 70–99)
Glucose-Capillary: 121 mg/dL — ABNORMAL HIGH (ref 70–99)
Glucose-Capillary: 121 mg/dL — ABNORMAL HIGH (ref 70–99)

## 2022-03-14 LAB — VANCOMYCIN, RANDOM: Vancomycin Rm: 38

## 2022-03-14 MED ORDER — QUETIAPINE FUMARATE 25 MG PO TABS
50.0000 mg | ORAL_TABLET | Freq: Every day | ORAL | Status: DC
  Administered 2022-03-14: 50 mg via ORAL
  Filled 2022-03-14 (×2): qty 2

## 2022-03-14 MED ORDER — POVIDONE-IODINE 10 % EX SWAB: 2.0000 "application " | Freq: Once | CUTANEOUS | Status: DC

## 2022-03-14 MED ORDER — MUPIROCIN 2 % EX OINT
1.0000 "application " | TOPICAL_OINTMENT | Freq: Two times a day (BID) | CUTANEOUS | Status: AC
  Administered 2022-03-15 – 2022-03-19 (×9): 1 via NASAL
  Filled 2022-03-14 (×4): qty 22

## 2022-03-14 MED ORDER — PANTOPRAZOLE SODIUM 40 MG IV SOLR
40.0000 mg | Freq: Two times a day (BID) | INTRAVENOUS | Status: DC
  Administered 2022-03-14 (×2): 40 mg via INTRAVENOUS
  Filled 2022-03-14 (×2): qty 10

## 2022-03-14 MED ORDER — DARBEPOETIN ALFA 200 MCG/0.4ML IJ SOSY: 200.0000 ug | PREFILLED_SYRINGE | INTRAMUSCULAR | Status: DC

## 2022-03-14 MED ORDER — CHLORHEXIDINE GLUCONATE 4 % EX LIQD
60.0000 mL | Freq: Once | CUTANEOUS | Status: AC
  Administered 2022-03-14: 4 via TOPICAL
  Filled 2022-03-14: qty 60

## 2022-03-14 MED ORDER — ALUM & MAG HYDROXIDE-SIMETH 200-200-20 MG/5ML PO SUSP
15.0000 mL | Freq: Four times a day (QID) | ORAL | Status: DC | PRN
  Administered 2022-03-14: 15 mL via ORAL
  Filled 2022-03-14: qty 30

## 2022-03-14 MED ORDER — CEFAZOLIN IN SODIUM CHLORIDE 3-0.9 GM/100ML-% IV SOLN
3.0000 g | INTRAVENOUS | Status: AC
  Administered 2022-03-15: 3 g via INTRAVENOUS
  Filled 2022-03-14 (×3): qty 100

## 2022-03-14 NOTE — Progress Notes (Signed)
?PROGRESS NOTE ? ?Jeffrey Ayala  ?DOB: 05-Jan-1961  ?PCP: Patient, No Pcp Per (Inactive) ?OFB:[id number]  ?[name]: 02/22/2022 ? LOS: 20 days  ?Hospital Day: 21 ? ?Brief narrative: ?Jeffrey Ayala is a 61 y.o. male with PMH significant for arthritis, bilateral knee replacement, gastric ulcer, kidney stones, eosinophilic esophagitis, migraines not on any regular meds.   ?Patient presented to the ED on 4/2 with complaint of nausea, vomiting, diarrhea and confusion for 24 hours.   ?EMS noted him to be in A-fib with a heart rate of 120-160 bpm. ?In the ED, he had a fever of 102.9. ?CT abdomen pelvis showed swollen scrotum concerning for infection, colon filled with diarrhea.  ?He was hypotensive requiring multiple vasopressors in the ICU.   ?4/3, patient required intubation and also had a brief cardiac arrest postintubation.   ?4/5, in an effort to correct vasoplegia, patient was also started on [name] ?Because of septic shock, patient's renal function worsened. ?4/6, patient was started on CRRT ?4/11, patient was extubated. ?4/14, patient was transferred out to [facility] service. ? ?Subjective: ?Patient was seen and examined this morning in dialysis unit. ?Alert, awake, slightly less restless ?Diarrhea improving on Imodium per RN.  Only 50 mill liquid output in last 24 hours. ?Noted a plan from Dr. Sharol Ayala for bilateral amputation tomorrow. ? ? ?Principal Problem: ?  Septic shock ([place]) ?Active Problems: ?  Pressure injury of skin ?  Encounter for central line placement ?  Severe protein-calorie malnutrition ([place]) ?  Altered mental status ?  Gangrene of right foot ([place]) ?  Gangrene of left foot ([place]) ?  Malnutrition of moderate degree ?  ?Assessment and Plan: ?Septic shock - POA ?-Patient was in septic shock on admission, thought due to scrotal cellulitis and possible aspiration pneumonia from n/v. ?-While in ICU, patient required multiple vasopressors including Levophed, epinephrine and vasopressin.  Also Ayala a  trial of methylene blue.  Eventually taken off pressors. ?-Blood culture did not show any growth.  Urine culture with multiple species.  Completed a course of antibiotics. ?-Urology follow-up appreciated.  Noted a plan of scrotum debridement tentatively tomorrow 4/23 timed with vascular surgery. ? ?New SIRS ?-4/18, patient spiked a fever of 102.4 with tachycardia.  Lactic acid level was normal. ?-Unclear cause of fever but with high suspicion of an infection, broad-spectrum antibiotics were started. ?-Chest x-ray not remarkable for pneumonia.  ?-Blood culture sent again.  No growth so far. ?-Continue IV cefepime and vancomycin for now. ?-No recurrence of fever since 4/18. Continue to monitor. ?Recent Labs  ?Lab 03/10/22 ?7824 03/10/22 ?1808 03/11/22 ?0559 03/13/22 ?2353 03/13/22 ?6144 03/14/22 ?0148  ?WBC 12.4*  --  12.8* 10.0 9.5 10.5  ?LATICACIDVEN  --  1.3  --   --   --   --   ? ?Diarrhea ?-Per wife, patient continues to have diarrhea since admission.  Currently patient has a Flexi-Seal in place. ?-4/17, GI pathogen panel negative. ?-Continues to have liquid stool but overall volume is improving after low-dose Imodium was started on 4/20. ? ?Acute anemia ?-Baseline hemoglobin normal.  Gradually downtrending hemoglobin level this hospitalization.  For the last several days, his hemoglobin remained between 7 and 8.  Hemoglobin dropped this morning to [facility] 9 on 4/21.  No evidence of active bleeding.  2 units of PRBC transfused.  Hemoglobin 7.4 this morning. ?-Keep on IV Protonix twice daily ?Recent Labs  ?  03/11/22 ?0559 03/13/22 ?3154 03/13/22 ?0086 03/13/22 ?1549 03/14/22 ?0148  ?HGB 7.5*  6.0* 5.9* 6.6* 7.4*  ?MCV 94.5 94.3 93.6  --  90.6  ? ?Acute metabolic encephalopathy ?-Patient continues to have altered mental status, drowsiness, excessive sleepiness although with intermittent slight improvement.  Multifactorial: Sepsis, uremia, possible hypoxemic injury during septic shock.  ?-No evidence of  intracranial infection. 4/15, CT head unremarkable. ?-Ammonia level unremarkable. ?-Surgical 50 mg nightly. ? ?AKI from septic shock -new dialysis dependence ?-Started CRRT 4/64/11, now on iHD ?-4/17, underwent TLC catheter placed ?-Nephrology following.  Making some urine.  Unclear if he will recover out of this. ? ?Dry gangrene of both feet ?-Secondary to vasospasm due to high-dose of vasopressors required for septic shock. ?-His wounds in both feet are continuing to demarcate.  Patient may ultimately require bilateral AKA  ?-Vascular surgery and orthopedics following. ?-She is waiting to discuss with Dr. Scot Ayala and Dr. Sharol Ayala about the ultimate plan and extent of amputation. ? ?New onset of afib, presents on admission ?-echocardiogram with preserved LVEF ?-remains in rate controlled afib ?-Was on heparin drip initially but it was stopped because of drop in hemoglobin. ?-Cardiology consulted. Currently on metoprolol 37.5 mg 3 times daily and aspirin 81 mg daily.  Heart rate remains mostly above 100. ?-If hemoglobin improves, will consider Eliquis at discharge. ?-Per cardiology, plan for outpatient DCCV if remains in A-fib and able to tolerate AC. ? ?Shock liver ?-Liver enzymes elevated likely due to shock.  Improving numbers.   ?-CT abd did not show hepatobiliary abnormality ? ?Acute hypoxic respiratory failure ?-Initially required intubation, later extubated.  Currently on room air. ? ?Elevated troponin ?-Likely because of brief cardiac arrest and septic shock. ?-Echo showed EF of 60 to 65% with normal wall motion. ?-No further ischemic evaluation at this time per cardiology. ? ?Morbid obesity  ?-Body mass index is 44.77 kg/m?Marland Kitchen Patient has been advised to make an attempt to improve diet and exercise patterns to aid in weight loss. ? ?Poor oral intake ?-Due to altered mentation.  Dietitian consult appreciated.  Currently getting tube feeding through core track.. ? ?Goals of care ?  Code Status: Full Code   ? ? ?Mobility: Encourage PT participation once mental status improves ? ?Skin assessment:  ?Pressure Injury 02/22/22 Coccyx Medial Stage 2 -  Partial thickness loss of dermis presenting as a shallow open injury with a red, pink wound bed without slough. pink, painful (Active)  ?02/22/22 2330  ?Location: Coccyx  ?Location Orientation: Medial  ?Staging: Stage 2 -  Partial thickness loss of dermis presenting as a shallow open injury with a red, pink wound bed without slough.  ?Wound Description (Comments): pink, painful  ?Present on Admission: Yes  ? ? ?Nutritional status:  ?Body mass index is 44.77 kg/m?Marland Kitchen  ?Nutrition Problem: Moderate Malnutrition ?Etiology: acute illness (septic shock, A fib, AKI) ?Signs/Symptoms: mild fat depletion, mild muscle depletion, moderate muscle depletion ? ? ? ? ?Diet:  ?Diet Order   ? ?       ?  Diet NPO time specified  Diet effective ____       ?  ?  Diet regular Room service appropriate? Yes; Fluid consistency: Thin  Diet effective now       ?  ? ?  ?  ? ?  ? ? ?DVT prophylaxis:  ?heparin injection 5,000 Units Start: 03/10/22 0600 ?SCDs Start: 02/22/22 2249 ?  ?Antimicrobials: IV cefepime and vancomycin since 4/18 ?Fluid: None ?Consultants: Nephrology, cardiology ?Family Communication: Wife not at bedside today ? ?Status is: Inpatient ? ?Continue in-hospital care because: Continues  to have multiple active medical issues.  Bilateral amputation tomorrow ?Level of care: Progressive  ? ?Dispo: The patient is from: Home ?             Anticipated d/c is to: Likely SNF ?             Patient currently is not medically stable to d/c. ?  Difficult to place patient No ? ? ? ? ?Infusions:  ? sodium chloride Stopped (03/06/22 2355)  ? ceFEPime (MAXIPIME) IV 1 g (03/13/22 2209)  ? feeding supplement (NEPRO CARB STEADY) 1,000 mL (03/13/22 2028)  ? vancomycin 1,000 mg (03/14/22 1021)  ? ? ?Scheduled Meds: ? aspirin EC  81 mg Oral Daily  ? chlorhexidine gluconate (MEDLINE KIT)  15 mL Mouth Rinse BID  ?  Chlorhexidine Gluconate Cloth  6 each Topical Q0600  ? darbepoetin (ARANESP) injection - DIALYSIS  200 mcg Intravenous Q Sat-HD  ? dronabinol  2.5 mg Oral QAC lunch  ? feeding supplement  237 mL Oral QID  ? feeding sup

## 2022-03-14 NOTE — Procedures (Signed)
Pt arrived to the unit in stable condition. BP upon arrival was 121/72. Permcath dressing intact with arterial and venous ports flushing and drawing easily.  Pt continues to be confused, however was able to tell me his name and that he was in the hospital.  Dialyzer clotted off with 30 min left of treatment.  Arterial and venous ports flushed easily and instilled with heparin. BP upon discharge was 135/81 and UF removed was 809 mls.   ?

## 2022-03-14 NOTE — Progress Notes (Signed)
?[facility] ?Progress Note  ? ? ?Assessment/ Plan:   ?AKI: in the setting of severe septic shock likely ATN.  Normal kidney function baseline ?            - started CRRT 02/26/22 - 4/11 ? - tolerated dialysis without significant issues ? -Maintain TTS schedule for now; HD 4/15 (sat) w/ 2L net UF. bladder scan 4/18 am only 179m -> will repeat today. Feels like he needs to go. ? ?HD 4/18 net UF 2.3L but BP on lower side, only 1L on 4/20 ? ?Seen on HD  ?3K 117/68 ?[place]? ?Limiting UF to 1L to hopefully not cause prolongation of renal failure.  ? ?Repeat bladder scan on 4/21 only 581m ? ?Will hold HD till at least Tuesday and hopefully we see more UOP. Hopefully patient did not sustain cortical necrosis/ permanent damage with the severe episode of septic shock. Still no signs of recovery.  ? ?Will check random vanco level also (last one 15 on 4/5) ? ? ?2.  Septic shock ?            - severe initially w/ high pressors requirement now improved + completed antibiotics ?  ?3.  Scrotal Edema ?            - urology following.  Urology feels this is unlikely the source of his infection. Continue w/ volume removal on HD. Per Dr. BoAlinda Moneyill need debridement for dry gangrene. ?  ?4.  Acute hypoxic RF ?            - Now extubated significantly improved.  Continue to monitor ? ?5.  Thrombocytopenia/ coagulopathy: Likely related cirrhosis and sepsis.  Now resolved ? ?6.  Anemia: Hemoglobin 7.4  606mof darbo given on 4/15. Will give another 200 today with dialysis. Transfusions if needed ? ? ?7.  Afib with RVR- hep--> bivalirudin gtt -> now holding anticoagulation ? ?8.  Foot wounds: associated with high-dose vasopressor requirements. Vascular and orthopedic surgery onboard. Likely will need a right AKA vs BKA. May be able to salvage the left heel per ortho.  ? ?9. Hyperphosphatemia: started on sevelamer -> incr to 3 tabs TIDM ?  ?10. Dispo: Floor status.  May need to start process of  arranging outpatient hemodialysis.  Rehabilitation will also be a factor ? ?Subjective:   ? ?Patient is in bed on dialysis confused but very pleasant; seems somewhat less confused and agitated today. ?  ? ?Objective:   ?BP 119/64   Pulse 97   Temp (!) 97.1 ?F (36.2 ?C)   Resp 16   Ht '5\' 10"'  (1.778 m)   Wt (!) 141.5 kg   SpO2 100%   BMI 44.77 kg/m?  ? ?Intake/Output Summary (Last 24 hours) at 03/14/2022 0953 ?Last data filed at 03/14/2022 0509 ?Gross per 24 hour  ?Intake 846 ml  ?Output 350 ml  ?Net 496 ml  ? ?Weight change: 4.082 kg ? ?Physical Exam: ?GENXTA:VWPVXYIAut in no distress, a little more awake today tho ?HEENT eyes closed, no nasal discharge ?PULM bilateral chest rise with no increased work of breathing ?CV Normal rate, no audible rub ?ABD distended, soft, bowel sounds present ?GU: swollen scrotum w/ dry gangrene ?EXT LEE improved, mottled and cool bilateral soles of feet and toes, dry gangrene b/l  ?ACCESS: RIJ TC ? ?Imaging: ?No results found. ? ?Labs: ?BMET ?Recent Labs  ?Lab 03/08/22 ?0530 03/09/22 ?0521655/18/23 ?0333748/19/23 ?0559 03/12/22 ?0134 03/13/22 ?  2257 03/14/22 ?0148  ?NA 134* 136 134* 137 136 134* 132*  133*  ?K 4.2 4.4 5.2* 4.3 4.3 4.2 4.5  4.5  ?CL 98 100 97* 98 97* 97* 96*  96*  ?CO2 22 18* 17* 22 20* 21* 18*  18*  ?GLUCOSE 108* 110* 129* 123* 131* 130* 130*  130*  ?BUN 55* 79* 102* 64* 92* 66* 97*  96*  ?CREATININE 6.49* 9.04* 11.60* 7.31* 9.44* 7.10* 8.81*  8.71*  ?CALCIUM 8.0* 8.3* 8.0* 8.5* 8.4* 8.5* 8.3*  8.3*  ?PHOS 8.5* 11.4*  --  8.9* 10.6* 8.4* 9.9*  ? ?CBC ?Recent Labs  ?Lab 03/10/22 ?5051 03/11/22 ?0559 03/13/22 ?8335 03/13/22 ?8251 03/13/22 ?1549 03/14/22 ?0148  ?WBC 12.4* 12.8* 10.0 9.5  --  10.5  ?NEUTROABS 9.9*  --   --   --   --  8.3*  ?HGB 7.1* 7.5* 6.0* 5.9* 6.6* 7.4*  ?HCT 23.1* 24.1* 19.8* 19.1* 20.7* 23.0*  ?MCV 93.9 94.5 94.3 93.6  --  90.6  ?PLT 330 360 311 295  --  293  ? ? ?Medications:   ? ? aspirin EC  81 mg Oral Daily  ? chlorhexidine gluconate  (MEDLINE KIT)  15 mL Mouth Rinse BID  ? Chlorhexidine Gluconate Cloth  6 each Topical Q0600  ? darbepoetin (ARANESP) injection - DIALYSIS  60 mcg Intravenous Q Sat-HD  ? dronabinol  2.5 mg Oral QAC lunch  ? feeding supplement  237 mL Oral QID  ? feeding supplement (PROSource TF)  45 mL Per Tube QID  ? folic acid  1 mg Oral Daily  ? guaiFENesin  600 mg Oral BID  ? heparin injection (subcutaneous)  5,000 Units Subcutaneous Q8H  ? insulin aspart  0-15 Units Subcutaneous Q4H  ? leptospermum manuka honey  1 application. Topical Daily  ? loperamide HCl  1 mg Oral BID  ? mouth rinse  15 mL Mouth Rinse BID  ? melatonin  3 mg Oral QHS  ? metoprolol tartrate  37.5 mg Oral TID  ? multivitamin  1 tablet Oral QHS  ? pantoprazole  40 mg Oral BID  ? sevelamer carbonate  2,400 mg Oral TID WC  ? sodium chloride flush  10-40 mL Intracatheter Q12H  ? ? ? ?Leila Schuff, Jeneen Rinks W ? ?03/14/2022, 9:53 AM   ?

## 2022-03-14 NOTE — Progress Notes (Signed)
? ?  Briefly seen at HD this am - no complaints. Noted to have afib with CVR. Dr. Marlou Porch' note was reviewed - he feels that Mr. Cruzan is at acceptable risk for his vascular procedure next week. Will follow with you. ? ?Pixie Casino, MD, [facility], FACP  ?[place]  ?Medical Director of the [facility] &  ?Cardiovascular Risk Reduction Clinic ?Diplomate of the [employer] of Clinical Lipidology ?Attending Cardiologist  ?Direct Dial: [phone]  Fax: [phone]  ?Website:  [email or web address] ? ?

## 2022-03-15 ENCOUNTER — Encounter (HOSPITAL_COMMUNITY): Payer: Self-pay | Admitting: Pulmonary Disease

## 2022-03-15 ENCOUNTER — Encounter (HOSPITAL_COMMUNITY)
Admission: EM | Disposition: A | Discharge status: Rehabilitation Facility | Payer: Self-pay | Source: Home / Self Care | Attending: Internal Medicine

## 2022-03-15 ENCOUNTER — Inpatient Hospital Stay (HOSPITAL_COMMUNITY): Payer: BC Managed Care – PPO | Admitting: Anesthesiology

## 2022-03-15 ENCOUNTER — Other Ambulatory Visit: Payer: Self-pay

## 2022-03-15 DIAGNOSIS — I96 Gangrene, not elsewhere classified: Secondary | ICD-10-CM | POA: Diagnosis not present

## 2022-03-15 DIAGNOSIS — A419 Sepsis, unspecified organism: Secondary | ICD-10-CM | POA: Diagnosis not present

## 2022-03-15 DIAGNOSIS — R6521 Severe sepsis with septic shock: Secondary | ICD-10-CM | POA: Diagnosis not present

## 2022-03-15 HISTORY — PX: TRANSMETATARSAL AMPUTATION: SHX6197

## 2022-03-15 HISTORY — PX: AMPUTATION: SHX166

## 2022-03-15 LAB — GLUCOSE, CAPILLARY
Glucose-Capillary: 106 mg/dL — ABNORMAL HIGH (ref 70–99)
Glucose-Capillary: 111 mg/dL — ABNORMAL HIGH (ref 70–99)
Glucose-Capillary: 133 mg/dL — ABNORMAL HIGH (ref 70–99)
Glucose-Capillary: 95 mg/dL (ref 70–99)
Glucose-Capillary: 96 mg/dL (ref 70–99)

## 2022-03-15 LAB — CBC WITH DIFFERENTIAL/PLATELET
Abs Immature Granulocytes: 0.22 10*3/uL — ABNORMAL HIGH (ref 0.00–0.07)
Basophils Absolute: 0.1 10*3/uL (ref 0.0–0.1)
Basophils Relative: 1 %
Eosinophils Absolute: 0 10*3/uL (ref 0.0–0.5)
Eosinophils Relative: 0 %
HCT: 25.2 % — ABNORMAL LOW (ref 39.0–52.0)
Hemoglobin: 8.1 g/dL — ABNORMAL LOW (ref 13.0–17.0)
Immature Granulocytes: 2 %
Lymphocytes Relative: 10 %
Lymphs Abs: 0.9 10*3/uL (ref 0.7–4.0)
MCH: 29.2 pg (ref 26.0–34.0)
MCHC: 32.1 g/dL (ref 30.0–36.0)
MCV: 91 fL (ref 80.0–100.0)
Monocytes Absolute: 0.7 10*3/uL (ref 0.1–1.0)
Monocytes Relative: 8 %
Neutro Abs: 7.1 10*3/uL (ref 1.7–7.7)
Neutrophils Relative %: 79 %
Platelets: 257 10*3/uL (ref 150–400)
RBC: 2.77 MIL/uL — ABNORMAL LOW (ref 4.22–5.81)
RDW: 15.7 % — ABNORMAL HIGH (ref 11.5–15.5)
WBC: 9 10*3/uL (ref 4.0–10.5)
nRBC: 0 % (ref 0.0–0.2)

## 2022-03-15 LAB — PREPARE RBC (CROSSMATCH)

## 2022-03-15 LAB — POCT I-STAT, CHEM 8
BUN: 91 mg/dL — ABNORMAL HIGH (ref 8–23)
Calcium, Ion: 1.12 mmol/L — ABNORMAL LOW (ref 1.15–1.40)
Chloride: 97 mmol/L — ABNORMAL LOW (ref 98–111)
Creatinine, Ser: 6.7 mg/dL — ABNORMAL HIGH (ref 0.61–1.24)
Glucose, Bld: 114 mg/dL — ABNORMAL HIGH (ref 70–99)
HCT: 23 % — ABNORMAL LOW (ref 39.0–52.0)
Hemoglobin: 7.8 g/dL — ABNORMAL LOW (ref 13.0–17.0)
Potassium: 4.4 mmol/L (ref 3.5–5.1)
Sodium: 132 mmol/L — ABNORMAL LOW (ref 135–145)
TCO2: 23 mmol/L (ref 22–32)

## 2022-03-15 LAB — RENAL FUNCTION PANEL
Albumin: 2.3 g/dL — ABNORMAL LOW (ref 3.5–5.0)
Anion gap: 15 (ref 5–15)
BUN: 72 mg/dL — ABNORMAL HIGH (ref 8–23)
CO2: 22 mmol/L (ref 22–32)
Calcium: 8.5 mg/dL — ABNORMAL LOW (ref 8.9–10.3)
Chloride: 95 mmol/L — ABNORMAL LOW (ref 98–111)
Creatinine, Ser: 6.6 mg/dL — ABNORMAL HIGH (ref 0.61–1.24)
GFR, Estimated: 9 mL/min — ABNORMAL LOW (ref >60)
Glucose, Bld: 118 mg/dL — ABNORMAL HIGH (ref 70–99)
Phosphorus: 7 mg/dL — ABNORMAL HIGH (ref 2.5–4.6)
Potassium: 4.3 mmol/L (ref 3.5–5.1)
Sodium: 132 mmol/L — ABNORMAL LOW (ref 135–145)

## 2022-03-15 LAB — CULTURE, BLOOD (ROUTINE X 2)
Culture: NO GROWTH
Culture: NO GROWTH
Special Requests: ADEQUATE
Special Requests: ADEQUATE

## 2022-03-15 LAB — SURGICAL PCR SCREEN
MRSA, PCR: NEGATIVE
Staphylococcus aureus: POSITIVE — AB

## 2022-03-15 SURGERY — AMPUTATION, FOOT, TRANSMETATARSAL
Anesthesia: Monitor Anesthesia Care | Site: Toe | Laterality: Right

## 2022-03-15 MED ORDER — DOCUSATE SODIUM 50 MG/5ML PO LIQD
100.0000 mg | Freq: Every day | ORAL | Status: DC
Start: 2022-03-15 — End: 2022-03-18
  Filled 2022-03-15: qty 10

## 2022-03-15 MED ORDER — OXYCODONE HCL 5 MG PO TABS: 5.0000 mg | ORAL_TABLET | ORAL | Status: DC | PRN

## 2022-03-15 MED ORDER — SEVELAMER CARBONATE 800 MG PO TABS
2400.0000 mg | ORAL_TABLET | Freq: Three times a day (TID) | ORAL | Status: DC
  Administered 2022-03-15 – 2022-03-18 (×3): 2400 mg
  Filled 2022-03-15 (×3): qty 3

## 2022-03-15 MED ORDER — OXYCODONE HCL 5 MG PO TABS
5.0000 mg | ORAL_TABLET | ORAL | Status: DC | PRN
  Administered 2022-03-15: 10 mg
  Filled 2022-03-15: qty 2

## 2022-03-15 MED ORDER — HYDRALAZINE HCL 20 MG/ML IJ SOLN: 5.0000 mg | INTRAMUSCULAR | Status: DC | PRN

## 2022-03-15 MED ORDER — HYDROMORPHONE HCL 1 MG/ML IJ SOLN
0.5000 mg | INTRAMUSCULAR | Status: DC | PRN
  Administered 2022-03-15 – 2022-03-16 (×5): 1 mg via INTRAVENOUS
  Filled 2022-03-15 (×6): qty 1

## 2022-03-15 MED ORDER — CHLORHEXIDINE GLUCONATE 0.12 % MT SOLN
OROMUCOSAL | Status: AC
  Filled 2022-03-15: qty 15

## 2022-03-15 MED ORDER — ZINC SULFATE 220 (50 ZN) MG PO CAPS: 220.0000 mg | ORAL_CAPSULE | Freq: Every day | ORAL | Status: DC

## 2022-03-15 MED ORDER — PHENOL 1.4 % MT LIQD: 1.0000 | OROMUCOSAL | Status: DC | PRN

## 2022-03-15 MED ORDER — BUPIVACAINE-EPINEPHRINE (PF) 0.5% -1:200000 IJ SOLN
INTRAMUSCULAR | Status: DC | PRN
  Administered 2022-03-15 (×2): 20 mL via PERINEURAL

## 2022-03-15 MED ORDER — JUVEN PO PACK
1.0000 | PACK | Freq: Two times a day (BID) | ORAL | Status: DC
  Administered 2022-03-15 – 2022-03-23 (×13): 1
  Filled 2022-03-15 (×13): qty 1

## 2022-03-15 MED ORDER — ENSURE ENLIVE PO LIQD: 237.0000 mL | Freq: Four times a day (QID) | ORAL | Status: DC

## 2022-03-15 MED ORDER — SODIUM CHLORIDE 0.9 % IV SOLN: INTRAVENOUS | Status: DC

## 2022-03-15 MED ORDER — MAGNESIUM SULFATE 2 GM/50ML IV SOLN: 2.0000 g | Freq: Every day | INTRAVENOUS | Status: DC | PRN

## 2022-03-15 MED ORDER — POLYETHYLENE GLYCOL 3350 17 G PO PACK: 17.0000 g | PACK | Freq: Every day | ORAL | Status: DC | PRN

## 2022-03-15 MED ORDER — LABETALOL HCL 5 MG/ML IV SOLN: 10.0000 mg | INTRAVENOUS | Status: DC | PRN

## 2022-03-15 MED ORDER — MIDAZOLAM HCL 2 MG/2ML IJ SOLN
INTRAMUSCULAR | Status: DC | PRN
  Administered 2022-03-15 (×2): .5 mg via INTRAVENOUS

## 2022-03-15 MED ORDER — GUAIFENESIN-DM 100-10 MG/5ML PO SYRP
15.0000 mL | ORAL_SOLUTION | ORAL | Status: DC | PRN
  Administered 2022-03-23: 15 mL
  Filled 2022-03-15: qty 15

## 2022-03-15 MED ORDER — ALUM & MAG HYDROXIDE-SIMETH 200-200-20 MG/5ML PO SUSP: 15.0000 mL | ORAL | Status: DC | PRN

## 2022-03-15 MED ORDER — ZINC SULFATE 220 (50 ZN) MG PO CAPS
220.0000 mg | ORAL_CAPSULE | Freq: Every day | ORAL | Status: DC
  Administered 2022-03-15 – 2022-03-23 (×8): 220 mg
  Filled 2022-03-15 (×8): qty 1

## 2022-03-15 MED ORDER — PHENYLEPHRINE 80 MCG/ML (10ML) SYRINGE FOR IV PUSH (FOR BLOOD PRESSURE SUPPORT)
PREFILLED_SYRINGE | INTRAVENOUS | Status: DC | PRN
  Administered 2022-03-15 (×6): 80 ug via INTRAVENOUS

## 2022-03-15 MED ORDER — RENA-VITE PO TABS
1.0000 | ORAL_TABLET | Freq: Every day | ORAL | Status: DC
  Administered 2022-03-15 – 2022-03-22 (×7): 1
  Filled 2022-03-15 (×7): qty 1

## 2022-03-15 MED ORDER — COLCHICINE 0.6 MG PO TABS
0.6000 mg | ORAL_TABLET | Freq: Once | ORAL | Status: AC
  Administered 2022-03-15: 0.6 mg
  Filled 2022-03-15: qty 1

## 2022-03-15 MED ORDER — LIDOCAINE HCL (PF) 2 % IJ SOLN
INTRAMUSCULAR | Status: DC | PRN
  Administered 2022-03-15 (×2): 100 mg via PERINEURAL

## 2022-03-15 MED ORDER — ROPIVACAINE HCL 7.5 MG/ML IJ SOLN
INTRAMUSCULAR | Status: DC | PRN
  Administered 2022-03-15: 20 mL via PERINEURAL

## 2022-03-15 MED ORDER — HYDROMORPHONE HCL 2 MG PO TABS
2.0000 mg | ORAL_TABLET | ORAL | Status: DC | PRN
  Administered 2022-03-15: 3 mg
  Filled 2022-03-15: qty 2

## 2022-03-15 MED ORDER — FENTANYL CITRATE (PF) 250 MCG/5ML IJ SOLN
INTRAMUSCULAR | Status: DC | PRN
  Administered 2022-03-15 (×2): 25 ug via INTRAVENOUS

## 2022-03-15 MED ORDER — MIDAZOLAM HCL 2 MG/2ML IJ SOLN
INTRAMUSCULAR | Status: AC
  Filled 2022-03-15: qty 2

## 2022-03-15 MED ORDER — PANTOPRAZOLE SODIUM 40 MG PO TBEC: 40.0000 mg | DELAYED_RELEASE_TABLET | Freq: Every day | ORAL | Status: DC

## 2022-03-15 MED ORDER — GUAIFENESIN-DM 100-10 MG/5ML PO SYRP: 15.0000 mL | ORAL_SOLUTION | ORAL | Status: DC | PRN

## 2022-03-15 MED ORDER — 0.9 % SODIUM CHLORIDE (POUR BTL) OPTIME
TOPICAL | Status: DC | PRN
  Administered 2022-03-15: 1000 mL

## 2022-03-15 MED ORDER — POTASSIUM CHLORIDE CRYS ER 20 MEQ PO TBCR: 20.0000 meq | EXTENDED_RELEASE_TABLET | Freq: Every day | ORAL | Status: DC | PRN

## 2022-03-15 MED ORDER — ACETAMINOPHEN 325 MG PO TABS
325.0000 mg | ORAL_TABLET | Freq: Four times a day (QID) | ORAL | Status: DC | PRN
  Administered 2022-03-19: 650 mg
  Filled 2022-03-15: qty 2

## 2022-03-15 MED ORDER — ORAL CARE MOUTH RINSE: 15.0000 mL | Freq: Once | OROMUCOSAL | Status: DC

## 2022-03-15 MED ORDER — ALLOPURINOL 20 MG/ML ORAL SUSPENSION
300.0000 mg | Freq: Every day | ORAL | Status: DC
  Filled 2022-03-15: qty 15

## 2022-03-15 MED ORDER — HEPARIN SODIUM (PORCINE) 1000 UNIT/ML IJ SOLN
1900.0000 [IU] | Freq: Once | INTRAMUSCULAR | Status: AC
  Administered 2022-03-15: 1900 [IU] via INTRAVENOUS

## 2022-03-15 MED ORDER — PROPOFOL 10 MG/ML IV BOLUS
INTRAVENOUS | Status: AC
  Filled 2022-03-15: qty 20

## 2022-03-15 MED ORDER — HYDROMORPHONE HCL 2 MG PO TABS: 2.0000 mg | ORAL_TABLET | ORAL | Status: DC | PRN

## 2022-03-15 MED ORDER — MELATONIN 3 MG PO TABS
3.0000 mg | ORAL_TABLET | Freq: Every day | ORAL | Status: DC
  Administered 2022-03-15 – 2022-03-22 (×7): 3 mg
  Filled 2022-03-15 (×7): qty 1

## 2022-03-15 MED ORDER — METOPROLOL TARTRATE 25 MG PO TABS
37.5000 mg | ORAL_TABLET | Freq: Three times a day (TID) | ORAL | Status: DC
  Administered 2022-03-15 – 2022-03-23 (×20): 37.5 mg
  Filled 2022-03-15 (×21): qty 1

## 2022-03-15 MED ORDER — CEFAZOLIN SODIUM-DEXTROSE 2-4 GM/100ML-% IV SOLN
2.0000 g | Freq: Three times a day (TID) | INTRAVENOUS | Status: DC
  Administered 2022-03-15: 2 g via INTRAVENOUS
  Filled 2022-03-15: qty 100

## 2022-03-15 MED ORDER — PANTOPRAZOLE 2 MG/ML SUSPENSION
40.0000 mg | Freq: Every day | ORAL | Status: DC
  Administered 2022-03-17 – 2022-03-29 (×11): 40 mg
  Filled 2022-03-15 (×16): qty 20

## 2022-03-15 MED ORDER — JUVEN PO PACK: 1.0000 | PACK | Freq: Two times a day (BID) | ORAL | Status: DC

## 2022-03-15 MED ORDER — DOCUSATE SODIUM 100 MG PO CAPS: 100.0000 mg | ORAL_CAPSULE | Freq: Every day | ORAL | Status: DC

## 2022-03-15 MED ORDER — SODIUM CHLORIDE 0.9% IV SOLUTION: Freq: Once | INTRAVENOUS | Status: DC

## 2022-03-15 MED ORDER — PHENYLEPHRINE 80 MCG/ML (10ML) SYRINGE FOR IV PUSH (FOR BLOOD PRESSURE SUPPORT)
PREFILLED_SYRINGE | INTRAVENOUS | Status: AC
  Filled 2022-03-15: qty 10

## 2022-03-15 MED ORDER — MAGNESIUM CITRATE PO SOLN
1.0000 | Freq: Once | ORAL | Status: DC | PRN
  Filled 2022-03-15: qty 296

## 2022-03-15 MED ORDER — ASCORBIC ACID 500 MG PO TABS
1000.0000 mg | ORAL_TABLET | Freq: Every day | ORAL | Status: DC
  Administered 2022-03-15 – 2022-03-23 (×8): 1000 mg
  Filled 2022-03-15 (×9): qty 2

## 2022-03-15 MED ORDER — FOLIC ACID 1 MG PO TABS
1.0000 mg | ORAL_TABLET | Freq: Every day | ORAL | Status: DC
  Administered 2022-03-17 – 2022-03-23 (×7): 1 mg
  Filled 2022-03-15 (×7): qty 1

## 2022-03-15 MED ORDER — FENTANYL CITRATE (PF) 250 MCG/5ML IJ SOLN
INTRAMUSCULAR | Status: AC
Start: 2022-03-15 — End: ?
  Filled 2022-03-15: qty 5

## 2022-03-15 MED ORDER — ASCORBIC ACID 500 MG PO TABS: 1000.0000 mg | ORAL_TABLET | Freq: Every day | ORAL | Status: DC

## 2022-03-15 MED ORDER — QUETIAPINE FUMARATE 25 MG PO TABS
50.0000 mg | ORAL_TABLET | Freq: Every day | ORAL | Status: DC
  Administered 2022-03-15 – 2022-03-22 (×7): 50 mg
  Filled 2022-03-15: qty 1
  Filled 2022-03-15 (×4): qty 2
  Filled 2022-03-15 (×2): qty 1

## 2022-03-15 MED ORDER — ALLOPURINOL 300 MG PO TABS
300.0000 mg | ORAL_TABLET | Freq: Every day | ORAL | Status: DC
  Administered 2022-03-15 – 2022-03-17 (×2): 300 mg
  Filled 2022-03-15 (×2): qty 1

## 2022-03-15 MED ORDER — ALUM & MAG HYDROXIDE-SIMETH 200-200-20 MG/5ML PO SUSP
15.0000 mL | Freq: Four times a day (QID) | ORAL | Status: DC | PRN
  Administered 2022-03-18: 15 mL
  Filled 2022-03-15: qty 30

## 2022-03-15 MED ORDER — ONDANSETRON HCL 4 MG/2ML IJ SOLN: 4.0000 mg | Freq: Four times a day (QID) | INTRAMUSCULAR | Status: DC | PRN

## 2022-03-15 MED ORDER — ACETAMINOPHEN 325 MG PO TABS: 325.0000 mg | ORAL_TABLET | Freq: Four times a day (QID) | ORAL | Status: DC | PRN

## 2022-03-15 MED ORDER — PROPOFOL 10 MG/ML IV BOLUS
INTRAVENOUS | Status: DC | PRN
  Administered 2022-03-15 (×3): 20 mg via INTRAVENOUS
  Administered 2022-03-15: 80 mg via INTRAVENOUS
  Administered 2022-03-15 (×3): 20 mg via INTRAVENOUS

## 2022-03-15 MED ORDER — METOPROLOL TARTRATE 5 MG/5ML IV SOLN: 2.0000 mg | INTRAVENOUS | Status: DC | PRN

## 2022-03-15 MED ORDER — BISACODYL 5 MG PO TBEC: 5.0000 mg | DELAYED_RELEASE_TABLET | Freq: Every day | ORAL | Status: DC | PRN

## 2022-03-15 MED ORDER — CHLORHEXIDINE GLUCONATE 0.12 % MT SOLN: 15.0000 mL | Freq: Once | OROMUCOSAL | Status: DC

## 2022-03-15 SURGICAL SUPPLY — 48 items
BAG COUNTER SPONGE SURGICOUNT (BAG) ×3 IMPLANT
BAG SPNG CNTER NS LX DISP (BAG) ×2
BLADE SAW RECIP 87.9 MT (BLADE) ×3 IMPLANT
BLADE SURG 21 STRL SS (BLADE) ×3 IMPLANT
BNDG CMPR 9X4 STRL LF SNTH (GAUZE/BANDAGES/DRESSINGS) ×2
BNDG COHESIVE 4X5 TAN STRL (GAUZE/BANDAGES/DRESSINGS) ×3 IMPLANT
BNDG ESMARK 4X9 LF (GAUZE/BANDAGES/DRESSINGS) ×1 IMPLANT
BNDG GAUZE ELAST 4 BULKY (GAUZE/BANDAGES/DRESSINGS) ×3 IMPLANT
CANISTER WOUND CARE 500ML ATS (WOUND CARE) ×2 IMPLANT
COVER SURGICAL LIGHT HANDLE (MISCELLANEOUS) ×5 IMPLANT
CUFF TOURN SGL QUICK 34 (TOURNIQUET CUFF) ×3
CUFF TRNQT CYL 34X4.125X (TOURNIQUET CUFF) ×2 IMPLANT
DRAPE DERMATAC (DRAPES) ×3 IMPLANT
DRAPE INCISE IOBAN 66X45 STRL (DRAPES) ×3 IMPLANT
DRAPE U-SHAPE 47X51 STRL (DRAPES) ×3 IMPLANT
DRESSING PREVENA PLUS CUSTOM (GAUZE/BANDAGES/DRESSINGS) ×2 IMPLANT
DRSG ADAPTIC 3X8 NADH LF (GAUZE/BANDAGES/DRESSINGS) IMPLANT
DRSG PAD ABDOMINAL 8X10 ST (GAUZE/BANDAGES/DRESSINGS) ×3 IMPLANT
DRSG PREVENA PLUS CUSTOM (GAUZE/BANDAGES/DRESSINGS) ×3
DURAPREP 26ML APPLICATOR (WOUND CARE) ×4 IMPLANT
ELECT REM PT RETURN 9FT ADLT (ELECTROSURGICAL) ×3
ELECTRODE REM PT RTRN 9FT ADLT (ELECTROSURGICAL) ×2 IMPLANT
GAUZE SPONGE 4X4 12PLY STRL (GAUZE/BANDAGES/DRESSINGS) IMPLANT
GLOVE BIOGEL PI IND STRL 9 (GLOVE) ×2 IMPLANT
GLOVE BIOGEL PI INDICATOR 9 (GLOVE) ×1
GLOVE SURG ORTHO 9.0 STRL STRW (GLOVE) ×3 IMPLANT
GOWN STRL REUS W/ TWL XL LVL3 (GOWN DISPOSABLE) ×4 IMPLANT
GOWN STRL REUS W/TWL XL LVL3 (GOWN DISPOSABLE) ×6
GRAFT SKIN WND SURGICLOSE M95 (Tissue) ×1 IMPLANT
KIT BASIN OR (CUSTOM PROCEDURE TRAY) ×3 IMPLANT
KIT DRSG PREVENA PLUS 7DAY 125 (MISCELLANEOUS) ×1 IMPLANT
KIT PREVENA INCISION MGT 13 (CANNISTER) ×1 IMPLANT
KIT TURNOVER KIT B (KITS) ×3 IMPLANT
MANIFOLD NEPTUNE II (INSTRUMENTS) ×3 IMPLANT
NEEDLE 22X1 1/2 (OR ONLY) (NEEDLE) IMPLANT
NS IRRIG 1000ML POUR BTL (IV SOLUTION) ×3 IMPLANT
PACK ORTHO EXTREMITY (CUSTOM PROCEDURE TRAY) ×3 IMPLANT
PREVENA RESTOR ARTHOFORM 46X30 (CANNISTER) ×3 IMPLANT
SPONGE T-LAP 18X18 ~~LOC~~+RFID (SPONGE) ×3 IMPLANT
STAPLER VISISTAT 35W (STAPLE) ×1 IMPLANT
STOCKINETTE IMPERVIOUS LG (DRAPES) ×3 IMPLANT
SUT ETHILON 2 0 PSLX (SUTURE) ×3 IMPLANT
SUT SILK 2 0 (SUTURE) ×3
SUT SILK 2-0 18XBRD TIE 12 (SUTURE) ×2 IMPLANT
SUT VIC AB 1 CTX 27 (SUTURE) ×6 IMPLANT
TOWEL GREEN STERILE (TOWEL DISPOSABLE) ×3 IMPLANT
TUBE CONNECTING 12X1/4 (SUCTIONS) ×3 IMPLANT
YANKAUER SUCT BULB TIP NO VENT (SUCTIONS) ×3 IMPLANT

## 2022-03-15 NOTE — Progress Notes (Signed)
?PROGRESS NOTE ? ?Jeffrey Ayala  ?DOB: 1961/10/27  ?PCP: Patient, No Pcp Per (Inactive) ?ATF:[id number]  ?[name]: 02/22/2022 ? LOS: 21 days  ?Hospital Day: 22 ? ?Brief narrative: ?Jeffrey Ayala is a 61 y.o. male with PMH significant for arthritis, bilateral knee replacement, gastric ulcer, kidney stones, eosinophilic esophagitis, migraines not on any regular meds.   ?Patient presented to the ED on 4/2 with complaint of nausea, vomiting, diarrhea and confusion for 24 hours.   ?EMS noted him to be in A-fib with a heart rate of 120-160 bpm. ?In the ED, he had a fever of 102.9. ?CT abdomen pelvis showed swollen scrotum concerning for infection, colon filled with diarrhea.  ?He was hypotensive requiring multiple vasopressors in the ICU.   ?4/3, patient required intubation and also had a brief cardiac arrest postintubation.   ?4/5, in an effort to correct vasoplegia, patient was also started on [name] ?Because of septic shock, patient's renal function worsened. ?4/6, patient was started on CRRT ?4/11, patient was extubated. ?4/14, patient was transferred out to [facility] service. ?4/23, underwent left transmetatarsal amputation and right BKA by [name] ? ?Subjective: ?Patient was seen and examined this morning. ?Patient was lying on bed.  Alert, awake, but not oriented to time place or person.  Son at bedside.  Wife in the waiting room.  I talked to both. ?Underwent left transmetatarsal and right BKA this morning by [name]. ?Alert, awake, slightly less restless. ?Diarrhea improving.  Lots of gas per family. ?Left elbow with gout flareup ? ?Principal Problem: ?  Septic shock ([place]) ?Active Problems: ?  Atrial fibrillation with RVR ([place]) ?  Pressure injury of skin ?  Encounter for central line placement ?  Severe protein-calorie malnutrition ([place]) ?  Altered mental status ?  Gangrene of right foot ([place]) ?  Gangrene of left foot ([place]) ?  Malnutrition of moderate degree ?  ?Assessment and Plan: ?Septic shock -  POA ?-Patient was in septic shock on admission, thought due to scrotal cellulitis and possible aspiration pneumonia from n/v. ?-While in ICU, patient required multiple vasopressors including Levophed, epinephrine and vasopressin.  Also given a trial of methylene blue.  Eventually taken off pressors. ?-Blood culture did not show any growth.  Urine culture with multiple species.  Completed a course of antibiotics. ?-Urology follow-up appreciated.  Defer to urology for the need/timing of scrotal debridement. ?-4/18, patient spiked a fever of 102.4 with tachycardia.  Lactic acid level was normal. ?-Unclear cause of fever but with high suspicion of an infection, broad-spectrum antibiotics were started. ?-Currently on a 7-day course of IV cefepime and vancomycin for now. ?-No recurrence of fever since 4/18. Continue to monitor. ?Recent Labs  ?Lab 03/10/22 ?1808 03/11/22 ?0559 03/13/22 ?2706 03/13/22 ?2376 03/14/22 ?0148 03/15/22 ?2831  ?WBC  --  12.8* 10.0 9.5 10.5 9.0  ?LATICACIDVEN 1.3  --   --   --   --   --   ? ? ?Dry gangrene of both feet ?-Secondary to vasospasm due to high-dose of vasopressors required for septic shock. ?-His wounds in both feet are continuing to demarcate.  Patient may ultimately require bilateral AKA  ?-Vascular surgery and orthopedics consult appreciated. ?-4/23, underwent left transmetatarsal amputation and right BKA by [name] ? ?AKI from septic shock -new dialysis dependence ?-Started CRRT 4/64/11, now on iHD ?-4/17, underwent TLC catheter placed ?-Nephrology following.  Making some urine.  Unclear if he will recover out of this. ? ?Acute anemia ?-Baseline hemoglobin normal.  Gradually downtrending hemoglobin level this hospitalization.  No evidence of active bleeding. ?-Received 3 units of PRBC transfusion in last 48 hours.  ?-Monitor for hemoglobin trend and potential source of bleeding. ?Recent Labs  ?  03/13/22 ?0953 03/13/22 ?1549 03/14/22 ?0148 03/15/22 ?7425 03/15/22 ?9563  ?HGB 5.9*  6.6* 7.4* 8.1* 7.8*  ?MCV 93.6  --  90.6 91.0  --   ? ? ?Acute metabolic encephalopathy ?-Patient continues to have altered mental status, drowsiness, excessive sleepiness although with intermittent slight improvement.  Multifactorial: Sepsis, uremia, possible hypoxemic injury during septic shock.  ?-No evidence of intracranial infection. 4/15, CT head unremarkable. ?-Ammonia level unremarkable. ?-Continue Seroquel 50 mg nightly. ? ?Diarrhea ?-Per wife, patient continues to have diarrhea since admission.  Currently patient has a Flexi-Seal in place. ?-4/17, GI pathogen panel negative. ?-Continues to have liquid stool but overall volume is improving after low-dose Imodium was started on 4/20. ?-Patient is passing a lot of gas.  Seems distended as well.  Simethicone added yesterday.  I will hold off on Imodium today. ? ?New onset of afib, presents on admission ?-echocardiogram with preserved LVEF ?-remains in rate controlled afib ?-Was on heparin drip initially but it was stopped because of drop in hemoglobin. ?-Cardiology consulted. Currently on metoprolol 37.5 mg 3 times daily and aspirin 81 mg daily.  Heart rate remains mostly above 100. ?-If hemoglobin improves, will consider Eliquis at discharge. ?-Per cardiology, plan for outpatient DCCV if remains in A-fib and able to tolerate AC. ? ?Shock liver ?-Liver enzymes elevated likely due to shock.  Improving numbers.   ?-CT abd did not show hepatobiliary abnormality ? ?Acute hypoxic respiratory failure ?-Initially required intubation, later extubated.  Currently on room air. ? ?Elevated troponin ?-Likely because of brief cardiac arrest and septic shock. ?-Echo showed EF of 60 to 65% with normal wall motion. ?-No further ischemic evaluation at this time per cardiology. ? ?Morbid obesity  ?-Body mass index is 44.77 kg/m?Marland Kitchen Patient has been advised to make an attempt to improve diet and exercise patterns to aid in weight loss. ? ?Poor oral intake ?-Due to altered  mentation.  Dietitian consult appreciated.  Currently getting tube feeding through core track.. ?-We will get a speech therapy evaluation again next 1 to 2 days.  If continues to have dysphagia, PEG tube would be a consideration.  Family is okay to proceed with PEG tube if needed ? ?Goals of care ?  Code Status: Full Code  ? ? ?Mobility: Encourage PT participation once mental status improves ? ?Skin assessment:  ?Pressure Injury 02/22/22 Coccyx Medial Stage 2 -  Partial thickness loss of dermis presenting as a shallow open injury with a red, pink wound bed without slough. pink, painful (Active)  ?02/22/22 2330  ?Location: Coccyx  ?Location Orientation: Medial  ?Staging: Stage 2 -  Partial thickness loss of dermis presenting as a shallow open injury with a red, pink wound bed without slough.  ?Wound Description (Comments): pink, painful  ?Present on Admission: Yes  ? ? ?Nutritional status:  ?Body mass index is 44.77 kg/m?Marland Kitchen  ?Nutrition Problem: Moderate Malnutrition ?Etiology: acute illness (septic shock, A fib, AKI) ?Signs/Symptoms: mild fat depletion, mild muscle depletion, moderate muscle depletion ? ? ? ? ?Diet:  ?Diet Order   ? ? None  ? ?  ? ? ?DVT prophylaxis:  ?SCD's Start: 03/15/22 1008 ?heparin injection 5,000 Units Start: 03/10/22 0600 ?SCDs Start: 02/22/22 2249 ?  ?Antimicrobials: IV cefepime and vancomycin since 4/18, plan for 7 days ?Fluid: None ?  Consultants: Nephrology, cardiology ?Family Communication: Discussed with son and wife ? ?Status is: Inpatient ? ?Continue in-hospital care because: Continues to have multiple active medical issues.  Bilateral amputation this morning ?Level of care: Progressive  ? ?Dispo: The patient is from: Home ?             Anticipated d/c is to: Likely CIR once medical issues stabilized ?             Patient currently is not medically stable to d/c. ?  Difficult to place patient No ? ? ? ? ?Infusions:  ? sodium chloride Stopped (03/06/22 2355)  ? sodium chloride    ?   ceFAZolin (ANCEF) IV    ? ceFEPime (MAXIPIME) IV 1 g (03/14/22 2119)  ? feeding supplement (NEPRO CARB STEADY) 1,000 mL (03/13/22 2028)  ? magnesium sulfate bolus IVPB    ? vancomycin 1,000 mg (03/14/22 1021)  ? ? ?

## 2022-03-15 NOTE — Anesthesia Procedure Notes (Signed)
Anesthesia Regional Block: Femoral nerve block  ? ?Pre-Anesthetic Checklist: , timeout performed,  Correct Patient, Correct Site, Correct Laterality,  Correct Procedure, Correct Position, site marked,  Risks and benefits discussed,  Surgical consent,  Pre-op evaluation,  At surgeon's request and post-op pain management ? ?Laterality: Right and Lower ? ?Prep: chloraprep     ?  ?Needles:  ?Injection technique: Single-shot ? ?  ? ? ?Needle Length: 9cm  ?Needle Gauge: 22  ? ? ? ?Additional Needles: ?Arrow? StimuQuik? ECHO Echogenic Stimulating PNB Needle ? ?Procedures:,,,, ultrasound used (permanent image in chart),,    ?Narrative:  ?Start time: 03/15/2022 7:38 AM ?End time: 03/15/2022 7:46 AM ?Injection made incrementally with aspirations every 5 mL. ? ?Performed by: Personally  ?Anesthesiologist: Oleta Mouse, MD ? ? ? ? ? ? ? ? ?

## 2022-03-15 NOTE — Anesthesia Postprocedure Evaluation (Signed)
Anesthesia Post Note ? ?Patient: Jeffrey Ayala ? ?Procedure(s) Performed: LEFT TRANSMETATARSAL AMPUTATION (Left: Toe) ?RIGHT BELOW KNEE AMPUTATION (Right: Knee) ? ?  ? ?Patient location during evaluation: PACU ?Anesthesia Type: Regional and General ?Level of consciousness: confused and responds to stimulation ?Pain management: pain level controlled ?Vital Signs Assessment: post-procedure vital signs reviewed and stable ?Respiratory status: spontaneous breathing, nonlabored ventilation, respiratory function stable and patient connected to nasal cannula oxygen ?Cardiovascular status: blood pressure returned to baseline and stable ?Postop Assessment: no apparent nausea or vomiting ?Anesthetic complications: no ? ? ?No notable events documented. ? ?Last Vitals:  ?Vitals:  ? 03/15/22 1000 03/15/22 1003  ?BP: 123/72 (!) 152/78  ?Pulse: 92 91  ?Resp: 18 15  ?Temp: (!) 36 ?C 36.9 ?C  ?SpO2: 93% 98%  ?  ?Last Pain:  ?Vitals:  ? 03/15/22 1003  ?TempSrc: Axillary  ?PainSc:   ? ? ?  ?  ?  ?  ?  ?  ? ?Jeffrey Ayala ? ? ? ? ?

## 2022-03-15 NOTE — Anesthesia Preprocedure Evaluation (Addendum)
Anesthesia Evaluation  ?Patient identified by MRN, date of birth, ID band ?Patient confused ? ? ? ?Reviewed: ?Allergy & Precautions, NPO status , Patient's Chart, lab work & pertinent test results, Unable to perform ROS - Chart review only ? ?History of Anesthesia Complications ?Negative for: history of anesthetic complications ? ?Airway ?Mallampati: III ? ?TM Distance: >3 FB ?Neck ROM: Full ? ? ? Dental ? ?(+) Poor Dentition ?  ?Pulmonary ? ?  ?breath sounds clear to auscultation ? ? ? ? ? ? Cardiovascular ? ?Rhythm:Regular  ??1. Left ventricular ejection fraction, by estimation, is 60 to 65%. The  ?left ventricle has normal function. The left ventricle has no regional  ?wall motion abnormalities. There is mild left ventricular hypertrophy.  ?Left ventricular diastolic function  ?could not be evaluated.  ??2. Right ventricular systolic function is normal. The right ventricular  ?size is normal.  ??3. The mitral valve is normal in structure. No evidence of mitral valve  ?regurgitation. No evidence of mitral stenosis.  ??4. The aortic valve is tricuspid. Aortic valve regurgitation is not  ?visualized. No aortic stenosis is present.  ??5. The inferior vena cava is dilated in size with <50% respiratory  ?variability, suggesting right atrial pressure of 15 mmHg.  ?  ?Neuro/Psych ?PSYCHIATRIC DISORDERS Depression   ? GI/Hepatic ?PUD,   ?Endo/Other  ?Morbid obesityLab Results ?     Component                Value               Date                 ?     HGBA1C                   5.5                 02/23/2022           ? ? Renal/GU ?ARF and CRFRenal diseaseLab Results ?     Component                Value               Date                 ?     CREATININE               8.71 (H)            03/14/2022           ?     CREATININE               8.81 (H)            03/14/2022           ?Lab Results ?     Component                Value               Date                 ?     K                         4.5                 03/14/2022           ?  K                        4.5                 03/14/2022           ?  ? ?  ?Musculoskeletal ? ?(+) Arthritis , gangrene bilateral feet  ? Abdominal ?  ?Peds ? Hematology ? ?(+) Blood dyscrasia, anemia , Lab Results ?     Component                Value               Date                 ?     WBC                      10.5                03/14/2022           ?     HGB                      7.4 (L)             03/14/2022           ?     HCT                      23.0 (L)            03/14/2022           ?     MCV                      90.6                03/14/2022           ?     PLT                      293                 03/14/2022           ?   ?Anesthesia Other Findings ? ? Reproductive/Obstetrics ? ?  ? ? ? ? ? ? ? ? ? ? ? ? ? ?  ?  ? ? ? ? ? ? ? ?Anesthesia Physical ?Anesthesia Plan ? ?ASA: 4 ? ?Anesthesia Plan: Regional and General  ? ?Post-op Pain Management: Regional block*  ? ?Induction:  ? ?PONV Risk Score and Plan: 2 and Ondansetron ? ?Airway Management Planned: LMA ? ?Additional Equipment: None ? ?Intra-op Plan:  ? ?Post-operative Plan: Extubation in OR ? ?Informed Consent:  ? ? ? ?Consent reviewed with POA and Dental advisory given ? ?Plan Discussed with: CRNA and Surgeon ? ?Anesthesia Plan Comments:   ? ? ? ? ? ?Anesthesia Quick Evaluation ? ?

## 2022-03-15 NOTE — Interval H&P Note (Signed)
History and Physical Interval Note: ? ?03/15/2022 ?7:27 AM ? ?Jeffrey Ayala  has presented today for surgery, with the diagnosis of gangrene bilateral feet.  The various methods of treatment have been discussed with the patient and family. After consideration of risks, benefits and other options for treatment, the patient has consented to  Procedure(s): ?LEFT TRANSMETATARSAL AMPUTATION (Left) ?RIGHT BELOW KNEE AMPUTATION (Right) as a surgical intervention.  The patient's history has been reviewed, patient examined, no change in status, stable for surgery.  I have reviewed the patient's chart and labs.  Questions were answered to the patient's satisfaction.   ? ? ?[name] ? ? ?

## 2022-03-15 NOTE — Progress Notes (Signed)
Transport in to take pt to OR. ?

## 2022-03-15 NOTE — Anesthesia Procedure Notes (Signed)
Procedure Name: LMA Insertion ?Date/Time: 03/15/2022 8:40 AM ?Performed by: Renato Shin, CRNA ?Pre-anesthesia Checklist: Patient identified, Emergency Drugs available, Suction available and Patient being monitored ?Patient Re-evaluated:Patient Re-evaluated prior to induction ?Oxygen Delivery Method: Circle system utilized ?Preoxygenation: Pre-oxygenation with 100% oxygen ?Induction Type: IV induction ?Ventilation: Mask ventilation without difficulty ?LMA: LMA inserted ?LMA Size: 5.0 ?Number of attempts: 1 ?Placement Confirmation: positive ETCO2 and breath sounds checked- equal and bilateral ?Tube secured with: Tape ?Dental Injury: Teeth and Oropharynx as per pre-operative assessment  ? ? ? ? ?

## 2022-03-15 NOTE — Anesthesia Procedure Notes (Signed)
Anesthesia Regional Block: Popliteal block  ? ?Pre-Anesthetic Checklist: , timeout performed,  Correct Patient, Correct Site, Correct Laterality,  Correct Procedure, Correct Position, site marked,  Risks and benefits discussed,  Surgical consent,  Pre-op evaluation,  At surgeon's request and post-op pain management ? ?Laterality: Right and Lower ? ?Prep: chloraprep     ?  ?Needles:  ?Injection technique: Single-shot ? ?  ? ? ?Needle Length: 9cm  ?Needle Gauge: 22  ? ? ? ?Additional Needles: ?Arrow? StimuQuik? ECHO Echogenic Stimulating PNB Needle ? ?Procedures:,,,, ultrasound used (permanent image in chart),,    ?Narrative:  ?Start time: 03/15/2022 7:52 AM ?End time: 03/15/2022 7:59 AM ?Injection made incrementally with aspirations every 5 mL. ? ?Performed by: Personally  ?Anesthesiologist: Oleta Mouse, MD ? ? ? ?

## 2022-03-15 NOTE — Op Note (Signed)
03/15/2022 ? ?9:38 AM ? ?PATIENT:  Jeffrey Ayala   ? ?PRE-OPERATIVE DIAGNOSIS:  gangrene bilateral feet ? ?POST-OPERATIVE DIAGNOSIS:  Same ? ?PROCEDURE:  LEFT TRANSMETATARSAL AMPUTATION, RIGHT BELOW KNEE AMPUTATION ?Application of Kerecis micro graft 95 cm? total. ?Application of Prevena customizable and Arnell Sieving form on the right. ?Application of 13 cm Prevena on the left. ? ?SURGEON:  [name], MD ? ?ANESTHESIA:   General ? ?PREOPERATIVE INDICATIONS:  Jeffrey Ayala is a  61 y.o. male with a diagnosis of gangrene bilateral feet who failed conservative measures and elected for surgical management.   ? ?The risks benefits and alternatives were discussed with the patient preoperatively including but not limited to the risks of infection, bleeding, nerve injury, cardiopulmonary complications, the need for revision surgery, among others, and the patient was willing to proceed. ? ?OPERATIVE IMPLANTS: Kerecis micro powder 95 cm?.  This was split evenly between both lower extremities. ? ? ?OPERATIVE FINDINGS: Patient had minimal petechial bleeding on the left.  All nonviable tissue was excised.  Patient had good petechial bleeding on the right. ? ?OPERATIVE PROCEDURE: Patient was brought to the operating room after undergoing a regional anesthetic.  After adequate levels anesthesia were obtained a thigh tourniquet was placed and the right lower extremity was prepped using DuraPrep draped into a sterile field.  The left lower extremity was also prepped using DuraPrep draped into a sterile field.   ? ?The right foot was draped out of the sterile field with impervious stockinette.  A timeout was called and the tourniquet inflated.   ? ?A transverse skin incision was made 12 cm distal to the tibial tubercle, on the right, the incision curved proximally, and a large posterior flap was created.  The tibia was transected just proximal to the skin incision and beveled anteriorly.  The fibula was transected just proximal to  the tibial incision.  The sciatic nerve was pulled cut and allowed to retract.  The vascular bundles were suture ligated with 2-0 silk.  The tourniquet was deflated and hemostasis obtained.  The Kerecis micro powder was then applied over the residual limb.  The deep and superficial fascial layers were closed using #1 Vicryl.  The skin was closed using staples.   ? ? ?Attention was then focused on the left lower extremity. ? ?A fishmouth incision was made just proximal to the ischemic skin changes.  A reciprocating saw was used to perform a transmetatarsal amputation.  There was minimal petechial bleeding there was no bleeding through the dorsalis pedis artery.  The wound was irrigated with normal saline, half of the Kerecis graft was then applied deep within the wound and the soft tissue was closed over the Kerecis graft with ? 2-0 nylon.  T ? ?On the right side the Prevena customizable dressing was applied this was overwrapped with the arthroform sponge.  Charlie Pitter was used to secure the sponges and the circumferential compression was secured to the skin with Dermatac.  This was connected to the wound VAC pump and had a good suction fit this was covered with a stump shrinker and a limb protector.  On the left side a 13 cm Prevena wound VAC was applied this was then covered with the derma tack this had a good suction fit this was overlapped with the stump shrinker.   ? ?Patient was taken to the PACU in stable condition. ? ? ?DISCHARGE PLANNING: ? ?Antibiotic duration: 24-hour antibiotics ? ?Weightbearing: Nonweightbearing on the operative extremity ? ?Pain medication:  Opioid pathway ? ?Dressing care/ Wound VAC: Continue wound VAC with the Prevena plus pump at discharge for 1 week ? ?Ambulatory devices: Walker or kneeling scooter ? ?Discharge to: Discharge planning based on recommendations per physical therapy ? ?Follow-up: In the office 1 week after discharge. ? ? ? ? ? ? ? ? ?

## 2022-03-15 NOTE — Transfer of Care (Signed)
Immediate Anesthesia Transfer of Care Note ? ?Patient: Jeffrey Ayala ? ?Procedure(s) Performed: LEFT TRANSMETATARSAL AMPUTATION (Left: Toe) ?RIGHT BELOW KNEE AMPUTATION (Right: Knee) ? ?Patient Location: PACU ? ?Anesthesia Type:GA combined with regional for post-op pain ? ?Level of Consciousness: drowsy and patient cooperative ? ?Airway & Oxygen Therapy: Patient Spontanous Breathing and Patient connected to face mask oxygen ? ?Post-op Assessment: Report given to RN and Post -op Vital signs reviewed and stable ? ?Post vital signs: Reviewed and stable ? ?Last Vitals:  ?Vitals Value Taken Time  ?BP 139/89 03/15/22 0939  ?Temp 35.9 ?C 03/15/22 0935  ?Pulse 92 03/15/22 0944  ?Resp 18 03/15/22 0944  ?SpO2 93 % 03/15/22 0944  ?Vitals shown include unvalidated device data. ? ?Last Pain:  ?Vitals:  ? 03/15/22 0935  ?TempSrc:   ?PainSc: 0-No pain  ?   ? ?  ? ?Complications: No notable events documented. ?

## 2022-03-15 NOTE — Anesthesia Procedure Notes (Signed)
Procedure Name: [place] ?Date/Time: 03/15/2022 8:16 AM ?Performed by: Renato Shin, CRNA ?Pre-anesthesia Checklist: Patient identified, Emergency Drugs available, Suction available and Patient being monitored ?Patient Re-evaluated:Patient Re-evaluated prior to induction ?Oxygen Delivery Method: Simple face mask ?Preoxygenation: Pre-oxygenation with 100% oxygen ?Induction Type: IV induction ?Placement Confirmation: positive ETCO2 and breath sounds checked- equal and bilateral ?Dental Injury: Teeth and Oropharynx as per pre-operative assessment  ? ? ? ? ?

## 2022-03-15 NOTE — Progress Notes (Signed)
?[facility] ?Progress Note  ? ? ?Assessment/ Plan:   ?AKI: in the setting of severe septic shock likely ATN.  Normal kidney function baseline ?            - started CRRT 02/26/22 - 4/11 ? - tolerated dialysis without significant issues ? -Maintain TTS schedule for now; HD 4/15 (sat) w/ 2L net UF. bladder scan 4/18 am only 165m -> will repeat today. Feels like he needs to go. ? ?HD 4/18 net UF 2.3L but BP on lower side, only 1L on 4/20, 8069mon 4/22  ? ?I limited UF past 2 treatments and will assess tomorrow if there are any signs of recovery. Considering holding HD till Tues to see if UOP picks up.  Hopefully patient did not sustain cortical necrosis/ permanent damage with the severe episode of septic shock. Still no signs of recovery.  ? ?Repeat bladder scan on 4/21 only 5463m? ?2.  Septic shock ?            - severe initially w/ high pressors requirement now improved + completed antibiotics ?  ?3.  Scrotal Edema ?            - urology following.  Urology feels this is unlikely the source of his infection. Continue w/ volume removal on HD. Per Dr. BorAlinda Moneyll need debridement for dry gangrene. ?  ?4.  Acute hypoxic RF ?            - Now extubated significantly improved.  Continue to monitor ? ?5.  Thrombocytopenia/ coagulopathy: Likely related cirrhosis and sepsis.  Now resolved ? ?6.  Anemia: Hemoglobin 7.4  47m52mf darbo given on 4/15. Will give another 200 today with dialysis. Transfusions if needed ? ? ?7.  Afib with RVR- hep--> bivalirudin gtt -> now holding anticoagulation ? ?8.  Foot wounds: associated with high-dose vasopressor requirements. Vascular and orthopedic surgery onboard. S/p rt BKA and lt TMA on 4/23. ? ?9. Hyperphosphatemia: started on sevelamer -> incr to 3 tabs TIDM ?  ?10. Dispo: Floor status.  May need to start process of arranging outpatient hemodialysis.  Rehabilitation will also be a factor ? ?Subjective:   ? ?Patient is back in bed, confused. Just back from OR. ?   ? ?Objective:   ?BP (!) 152/78 (BP Location: Right Arm)   Pulse 91   Temp 98.5 ?F (36.9 ?C) (Axillary)   Resp 15   Ht _0  (1.778 m)   Wt (!) 141.5 kg   SpO2 98%   BMI 44.77 kg/m?  ? ?Intake/Output Summary (Last 24 hours) at 03/15/2022 1258 ?Last data filed at 03/15/2022 0915 ?Gross per 24 hour  ?Intake 665 ml  ?Output 200 ml  ?Net 465 ml  ? ?Weight change: 0 kg ? ?Physical Exam: ?GEN:[id number] in no distress ?HEENT eyes closed, no nasal discharge ?PULM bilateral chest rise with no increased work of breathing ?CV Normal rate, no audible rub ?ABD distended, soft, bowel sounds present ?GU: swollen scrotum w/ dry gangrene ?EXT right BKA, left TMA with wound vac ?ACCESS: RIJ TC ? ?Imaging: ?No results found. ? ?Labs: ?BMET ?Recent Labs  ?Lab 03/09/22 ?[id number]/23 ?[id number]/23 ?0559 03/12/22 ?0134 03/13/22 ?[id number]/23 ?0148 03/15/22 ?[id number]/23 ?[id number] 136 134* 137 136 134* 132*  133* 132* 132*  ?K 4.4 5.2* 4.3 4.3 4.2 4.5  4.5 4.3 4.4  ?CL 100 97* 98 97* 97* 96*  96* 95* 97*  ?CO2 18* 17* 22 20*  21* 18*  18* 22  --   ?GLUCOSE 110* 129* 123* 131* 130* 130*  130* 118* 114*  ?BUN 79* 102* 64* 92* 66* 97*  96* 72* 91*  ?CREATININE 9.04* 11.60* 7.31* 9.44* 7.10* 8.81*  8.71* 6.60* 6.70*  ?CALCIUM 8.3* 8.0* 8.5* 8.4* 8.5* 8.3*  8.3* 8.5*  --   ?PHOS 11.4*  --  8.9* 10.6* 8.4* 9.9* 7.0*  --   ? ?CBC ?Recent Labs  ?Lab 03/10/22 ?9163 03/11/22 ?0559 03/13/22 ?8466 03/13/22 ?5993 03/13/22 ?1549 03/14/22 ?0148 03/15/22 ?5701 03/15/22 ?7793  ?WBC 12.4*   < > 10.0 9.5  --  10.5 9.0  --   ?NEUTROABS 9.9*  --   --   --   --  8.3* 7.1  --   ?HGB 7.1*   < > 6.0* 5.9* 6.6* 7.4* 8.1* 7.8*  ?HCT 23.1*   < > 19.8* 19.1* 20.7* 23.0* 25.2* 23.0*  ?MCV 93.9   < > 94.3 93.6  --  90.6 91.0  --   ?PLT 330   < > 311 295  --  293 257  --   ? < > = values in this interval not displayed.  ? ? ?Medications:   ? ? sodium chloride   Intravenous Once  ? allopurinol  300 mg Per Tube Daily  ? vitamin C  1,000 mg Per Tube  Daily  ? aspirin EC  81 mg Oral Daily  ? chlorhexidine gluconate (MEDLINE KIT)  15 mL Mouth Rinse BID  ? Chlorhexidine Gluconate Cloth  6 each Topical Q0600  ? colchicine  0.6 mg Per Tube Once  ? darbepoetin (ARANESP) injection - DIALYSIS  200 mcg Intravenous Q Sat-HD  ? docusate  100 mg Per Tube Daily  ? dronabinol  2.5 mg Oral QAC lunch  ? feeding supplement  237 mL Per Tube QID  ? feeding supplement (PROSource TF)  45 mL Per Tube QID  ? [START ON 07/27/91] folic acid  1 mg Per Tube Daily  ? guaiFENesin  600 mg Oral BID  ? heparin injection (subcutaneous)  5,000 Units Subcutaneous Q8H  ? insulin aspart  0-15 Units Subcutaneous Q4H  ? leptospermum manuka honey  1 application. Topical Daily  ? mouth rinse  15 mL Mouth Rinse BID  ? melatonin  3 mg Per Tube QHS  ? metoprolol tartrate  37.5 mg Per Tube TID  ? multivitamin  1 tablet Per Tube QHS  ? mupirocin ointment  1 application. Nasal BID  ? nutrition supplement (JUVEN)  1 packet Per Tube BID BM  ? pantoprazole sodium  40 mg Per Tube Daily  ? QUEtiapine  50 mg Per Tube QHS  ? sevelamer carbonate  2,400 mg Per Tube TID WC  ? sodium chloride flush  10-40 mL Intracatheter Q12H  ? zinc sulfate  220 mg Per Tube Daily  ? ? ? ?Jeffrey Ayala ? ?03/15/2022, 12:58 PM   ?

## 2022-03-15 NOTE — Anesthesia Procedure Notes (Signed)
Anesthesia Regional Block: Popliteal block  ? ?Pre-Anesthetic Checklist: , timeout performed,  Correct Patient, Correct Site, Correct Laterality,  Correct Procedure, Correct Position, site marked,  Risks and benefits discussed,  Surgical consent,  Pre-op evaluation,  At surgeon's request and post-op pain management ? ?Laterality: Left and Lower ? ?Prep: chloraprep     ?  ?Needles:  ?Injection technique: Single-shot ? ?  ? ? ?Needle Length: 9cm  ?Needle Gauge: 22  ? ? ? ?Additional Needles: ?Arrow? StimuQuik? ECHO Echogenic Stimulating PNB Needle ? ?Procedures:,,,, ultrasound used (permanent image in chart),,    ?Narrative:  ?Start time: 03/15/2022 7:46 AM ?End time: 03/15/2022 7:51 AM ?Injection made incrementally with aspirations every 5 mL. ? ?Performed by: Personally  ?Anesthesiologist: Oleta Mouse, MD ? ? ? ?

## 2022-03-16 ENCOUNTER — Encounter (HOSPITAL_COMMUNITY)
Admission: EM | Disposition: A | Discharge status: Rehabilitation Facility | Payer: Self-pay | Source: Home / Self Care | Attending: Internal Medicine

## 2022-03-16 ENCOUNTER — Other Ambulatory Visit: Payer: Self-pay

## 2022-03-16 ENCOUNTER — Encounter (HOSPITAL_COMMUNITY): Payer: Self-pay | Admitting: Pulmonary Disease

## 2022-03-16 ENCOUNTER — Inpatient Hospital Stay (HOSPITAL_COMMUNITY): Payer: BC Managed Care – PPO | Admitting: Certified Registered Nurse Anesthetist

## 2022-03-16 DIAGNOSIS — R6521 Severe sepsis with septic shock: Secondary | ICD-10-CM | POA: Diagnosis not present

## 2022-03-16 DIAGNOSIS — L0390 Cellulitis, unspecified: Secondary | ICD-10-CM

## 2022-03-16 DIAGNOSIS — R0689 Other abnormalities of breathing: Secondary | ICD-10-CM

## 2022-03-16 DIAGNOSIS — A419 Sepsis, unspecified organism: Secondary | ICD-10-CM | POA: Diagnosis not present

## 2022-03-16 DIAGNOSIS — I959 Hypotension, unspecified: Secondary | ICD-10-CM

## 2022-03-16 HISTORY — PX: SCROTAL EXPLORATION: SHX2386

## 2022-03-16 LAB — CBC WITH DIFFERENTIAL/PLATELET
Abs Immature Granulocytes: 0.19 10*3/uL — ABNORMAL HIGH (ref 0.00–0.07)
Basophils Absolute: 0.1 10*3/uL (ref 0.0–0.1)
Basophils Relative: 0 %
Eosinophils Absolute: 0 10*3/uL (ref 0.0–0.5)
Eosinophils Relative: 0 %
HCT: 24 % — ABNORMAL LOW (ref 39.0–52.0)
Hemoglobin: 7.7 g/dL — ABNORMAL LOW (ref 13.0–17.0)
Immature Granulocytes: 2 %
Lymphocytes Relative: 7 %
Lymphs Abs: 0.9 10*3/uL (ref 0.7–4.0)
MCH: 29.2 pg (ref 26.0–34.0)
MCHC: 32.1 g/dL (ref 30.0–36.0)
MCV: 90.9 fL (ref 80.0–100.0)
Monocytes Absolute: 0.9 10*3/uL (ref 0.1–1.0)
Monocytes Relative: 8 %
Neutro Abs: 10.1 10*3/uL — ABNORMAL HIGH (ref 1.7–7.7)
Neutrophils Relative %: 83 %
Platelets: 228 10*3/uL (ref 150–400)
RBC: 2.64 MIL/uL — ABNORMAL LOW (ref 4.22–5.81)
RDW: 15.7 % — ABNORMAL HIGH (ref 11.5–15.5)
WBC: 12.2 10*3/uL — ABNORMAL HIGH (ref 4.0–10.5)
nRBC: 0 % (ref 0.0–0.2)

## 2022-03-16 LAB — POCT I-STAT 7, (LYTES, BLD GAS, ICA,H+H)
Acid-Base Excess: 3 mmol/L — ABNORMAL HIGH (ref 0.0–2.0)
Bicarbonate: 26.9 mmol/L (ref 20.0–28.0)
Calcium, Ion: 1.04 mmol/L — ABNORMAL LOW (ref 1.15–1.40)
HCT: 29 % — ABNORMAL LOW (ref 39.0–52.0)
Hemoglobin: 9.9 g/dL — ABNORMAL LOW (ref 13.0–17.0)
O2 Saturation: 100 %
Patient temperature: 98.3
Potassium: 4.2 mmol/L (ref 3.5–5.1)
Sodium: 131 mmol/L — ABNORMAL LOW (ref 135–145)
TCO2: 28 mmol/L (ref 22–32)
pCO2 arterial: 37.1 mmHg (ref 32–48)
pH, Arterial: 7.469 — ABNORMAL HIGH (ref 7.35–7.45)
pO2, Arterial: 518 mmHg — ABNORMAL HIGH (ref 83–108)

## 2022-03-16 LAB — RENAL FUNCTION PANEL
Albumin: 2.1 g/dL — ABNORMAL LOW (ref 3.5–5.0)
Albumin: 2.1 g/dL — ABNORMAL LOW (ref 3.5–5.0)
Anion gap: 18 — ABNORMAL HIGH (ref 5–15)
Anion gap: 15 (ref 5–15)
BUN: 105 mg/dL — ABNORMAL HIGH (ref 8–23)
BUN: 109 mg/dL — ABNORMAL HIGH (ref 8–23)
CO2: 18 mmol/L — ABNORMAL LOW (ref 22–32)
CO2: 20 mmol/L — ABNORMAL LOW (ref 22–32)
Calcium: 8.2 mg/dL — ABNORMAL LOW (ref 8.9–10.3)
Calcium: 8.2 mg/dL — ABNORMAL LOW (ref 8.9–10.3)
Chloride: 97 mmol/L — ABNORMAL LOW (ref 98–111)
Chloride: 99 mmol/L (ref 98–111)
Creatinine, Ser: 8.31 mg/dL — ABNORMAL HIGH (ref 0.61–1.24)
Creatinine, Ser: 8.93 mg/dL — ABNORMAL HIGH (ref 0.61–1.24)
GFR, Estimated: 7 mL/min — ABNORMAL LOW (ref >60)
GFR, Estimated: 6 mL/min — ABNORMAL LOW (ref >60)
Glucose, Bld: 141 mg/dL — ABNORMAL HIGH (ref 70–99)
Glucose, Bld: 115 mg/dL — ABNORMAL HIGH (ref 70–99)
Phosphorus: 8.1 mg/dL — ABNORMAL HIGH (ref 2.5–4.6)
Phosphorus: 8.1 mg/dL — ABNORMAL HIGH (ref 2.5–4.6)
Potassium: 5.2 mmol/L — ABNORMAL HIGH (ref 3.5–5.1)
Potassium: 5.1 mmol/L (ref 3.5–5.1)
Sodium: 133 mmol/L — ABNORMAL LOW (ref 135–145)
Sodium: 134 mmol/L — ABNORMAL LOW (ref 135–145)

## 2022-03-16 LAB — VANCOMYCIN, RANDOM: Vancomycin Rm: 29

## 2022-03-16 LAB — POCT I-STAT, CHEM 8
BUN: 63 mg/dL — ABNORMAL HIGH (ref 8–23)
Calcium, Ion: 1.07 mmol/L — ABNORMAL LOW (ref 1.15–1.40)
Chloride: 100 mmol/L (ref 98–111)
Creatinine, Ser: 5.8 mg/dL — ABNORMAL HIGH (ref 0.61–1.24)
Glucose, Bld: 113 mg/dL — ABNORMAL HIGH (ref 70–99)
HCT: 25 % — ABNORMAL LOW (ref 39.0–52.0)
Hemoglobin: 8.5 g/dL — ABNORMAL LOW (ref 13.0–17.0)
Potassium: 4.7 mmol/L (ref 3.5–5.1)
Sodium: 134 mmol/L — ABNORMAL LOW (ref 135–145)
TCO2: 24 mmol/L (ref 22–32)

## 2022-03-16 LAB — GLUCOSE, CAPILLARY
Glucose-Capillary: 107 mg/dL — ABNORMAL HIGH (ref 70–99)
Glucose-Capillary: 113 mg/dL — ABNORMAL HIGH (ref 70–99)
Glucose-Capillary: 116 mg/dL — ABNORMAL HIGH (ref 70–99)
Glucose-Capillary: 140 mg/dL — ABNORMAL HIGH (ref 70–99)
Glucose-Capillary: 97 mg/dL (ref 70–99)

## 2022-03-16 SURGERY — EXPLORATION, SCROTUM
Anesthesia: General | Site: Scrotum

## 2022-03-16 MED ORDER — PHENYLEPHRINE HCL-NACL 20-0.9 MG/250ML-% IV SOLN: 0.0000 ug/min | INTRAVENOUS | Status: DC

## 2022-03-16 MED ORDER — VANCOMYCIN HCL IN DEXTROSE 1-5 GM/200ML-% IV SOLN: 1000.0000 mg | Freq: Once | INTRAVENOUS | Status: DC

## 2022-03-16 MED ORDER — PROPOFOL 1000 MG/100ML IV EMUL
INTRAVENOUS | Status: AC
  Filled 2022-03-16: qty 100

## 2022-03-16 MED ORDER — LIDOCAINE 2% (20 MG/ML) 5 ML SYRINGE
INTRAMUSCULAR | Status: AC
  Filled 2022-03-16: qty 5

## 2022-03-16 MED ORDER — ALTEPLASE 2 MG IJ SOLR: 2.0000 mg | Freq: Once | INTRAMUSCULAR | Status: DC | PRN

## 2022-03-16 MED ORDER — VANCOMYCIN HCL 500 MG/100ML IV SOLN
500.0000 mg | Freq: Once | INTRAVENOUS | Status: DC
Start: 2022-03-16 — End: 2022-03-16

## 2022-03-16 MED ORDER — ROCURONIUM BROMIDE 10 MG/ML (PF) SYRINGE
PREFILLED_SYRINGE | INTRAVENOUS | Status: AC
  Filled 2022-03-16: qty 10

## 2022-03-16 MED ORDER — LIDOCAINE HCL (PF) 1 % IJ SOLN: 5.0000 mL | INTRAMUSCULAR | Status: DC | PRN

## 2022-03-16 MED ORDER — PROPOFOL 1000 MG/100ML IV EMUL
5.0000 ug/kg/min | INTRAVENOUS | Status: DC
  Administered 2022-03-16: 35 ug/kg/min via INTRAVENOUS
  Administered 2022-03-17: 50 ug/kg/min via INTRAVENOUS
  Administered 2022-03-17: 20 ug/kg/min via INTRAVENOUS
  Filled 2022-03-16: qty 200
  Filled 2022-03-16: qty 100

## 2022-03-16 MED ORDER — CALCIUM GLUCONATE-NACL 1-0.675 GM/50ML-% IV SOLN
1.0000 g | Freq: Once | INTRAVENOUS | Status: AC
  Administered 2022-03-16: 1000 mg via INTRAVENOUS
  Filled 2022-03-16: qty 50

## 2022-03-16 MED ORDER — PHENYLEPHRINE HCL (PRESSORS) 10 MG/ML IV SOLN
INTRAVENOUS | Status: DC | PRN
  Administered 2022-03-16: 80 ug via INTRAVENOUS
  Administered 2022-03-16: 160 ug via INTRAVENOUS
  Administered 2022-03-16 (×2): 80 ug via INTRAVENOUS

## 2022-03-16 MED ORDER — SUCCINYLCHOLINE CHLORIDE 200 MG/10ML IV SOSY
PREFILLED_SYRINGE | INTRAVENOUS | Status: DC | PRN
  Administered 2022-03-16: 120 mg via INTRAVENOUS

## 2022-03-16 MED ORDER — LORAZEPAM 2 MG/ML IJ SOLN
1.0000 mg | Freq: Once | INTRAMUSCULAR | Status: AC
  Administered 2022-03-16: 1 mg via INTRAVENOUS
  Filled 2022-03-16: qty 1

## 2022-03-16 MED ORDER — PHENYLEPHRINE HCL-NACL 20-0.9 MG/250ML-% IV SOLN
25.0000 ug/min | INTRAVENOUS | Status: DC
  Administered 2022-03-16: 20 ug/min via INTRAVENOUS

## 2022-03-16 MED ORDER — SUCCINYLCHOLINE CHLORIDE 200 MG/10ML IV SOSY
PREFILLED_SYRINGE | INTRAVENOUS | Status: AC
  Filled 2022-03-16: qty 10

## 2022-03-16 MED ORDER — ROCURONIUM BROMIDE 10 MG/ML (PF) SYRINGE
PREFILLED_SYRINGE | INTRAVENOUS | Status: DC | PRN
  Administered 2022-03-16: 50 mg via INTRAVENOUS

## 2022-03-16 MED ORDER — ALBUTEROL SULFATE HFA 108 (90 BASE) MCG/ACT IN AERS
INHALATION_SPRAY | RESPIRATORY_TRACT | Status: AC
  Filled 2022-03-16: qty 6.7

## 2022-03-16 MED ORDER — PROPOFOL 500 MG/50ML IV EMUL
INTRAVENOUS | Status: DC | PRN
  Administered 2022-03-16: 50 ug/kg/min via INTRAVENOUS

## 2022-03-16 MED ORDER — ORAL CARE MOUTH RINSE: 15.0000 mL | Freq: Once | OROMUCOSAL | Status: DC

## 2022-03-16 MED ORDER — CHLORHEXIDINE GLUCONATE 0.12% ORAL RINSE (MEDLINE KIT)
15.0000 mL | Freq: Two times a day (BID) | OROMUCOSAL | Status: DC
  Administered 2022-03-17: 15 mL via OROMUCOSAL

## 2022-03-16 MED ORDER — LIDOCAINE 2% (20 MG/ML) 5 ML SYRINGE
INTRAMUSCULAR | Status: DC | PRN
  Administered 2022-03-16: 100 mg via INTRAVENOUS

## 2022-03-16 MED ORDER — PENTAFLUOROPROP-TETRAFLUOROETH EX AERO: 1.0000 "application " | INHALATION_SPRAY | CUTANEOUS | Status: DC | PRN

## 2022-03-16 MED ORDER — QUETIAPINE FUMARATE 25 MG PO TABS
25.0000 mg | ORAL_TABLET | Freq: Every day | ORAL | Status: DC
  Administered 2022-03-16 – 2022-03-23 (×7): 25 mg
  Filled 2022-03-16 (×8): qty 1

## 2022-03-16 MED ORDER — FENTANYL CITRATE (PF) 250 MCG/5ML IJ SOLN
INTRAMUSCULAR | Status: AC
  Filled 2022-03-16: qty 5

## 2022-03-16 MED ORDER — SODIUM CHLORIDE 0.9 % IV SOLN: INTRAVENOUS | Status: DC | PRN

## 2022-03-16 MED ORDER — PHENYLEPHRINE 80 MCG/ML (10ML) SYRINGE FOR IV PUSH (FOR BLOOD PRESSURE SUPPORT)
PREFILLED_SYRINGE | INTRAVENOUS | Status: DC | PRN
Start: 2022-03-16 — End: 2022-03-16
  Administered 2022-03-16: 160 ug via INTRAVENOUS
  Administered 2022-03-16: 80 ug via INTRAVENOUS

## 2022-03-16 MED ORDER — CHLORHEXIDINE GLUCONATE 0.12 % MT SOLN: 15.0000 mL | Freq: Once | OROMUCOSAL | Status: DC

## 2022-03-16 MED ORDER — LORAZEPAM 2 MG/ML IJ SOLN: 1.0000 mg | Freq: Once | INTRAMUSCULAR | Status: DC

## 2022-03-16 MED ORDER — SODIUM CHLORIDE 0.9 % IV SOLN: 100.0000 mL | INTRAVENOUS | Status: DC | PRN

## 2022-03-16 MED ORDER — ASPIRIN 81 MG PO CHEW
81.0000 mg | CHEWABLE_TABLET | Freq: Every day | ORAL | Status: DC
  Administered 2022-03-17 – 2022-03-23 (×7): 81 mg
  Filled 2022-03-16 (×7): qty 1

## 2022-03-16 MED ORDER — GUAIFENESIN 100 MG/5ML PO LIQD
400.0000 mg | Freq: Three times a day (TID) | ORAL | Status: DC
  Administered 2022-03-16 – 2022-03-29 (×28): 400 mg
  Filled 2022-03-16: qty 10
  Filled 2022-03-16 (×4): qty 20
  Filled 2022-03-16: qty 40
  Filled 2022-03-16 (×3): qty 20
  Filled 2022-03-16: qty 10
  Filled 2022-03-16: qty 20
  Filled 2022-03-16 (×2): qty 40
  Filled 2022-03-16: qty 20
  Filled 2022-03-16: qty 10
  Filled 2022-03-16: qty 20
  Filled 2022-03-16: qty 10
  Filled 2022-03-16: qty 20
  Filled 2022-03-16: qty 10
  Filled 2022-03-16 (×6): qty 20
  Filled 2022-03-16 (×4): qty 10
  Filled 2022-03-16: qty 20

## 2022-03-16 MED ORDER — ORAL CARE MOUTH RINSE
15.0000 mL | OROMUCOSAL | Status: DC
  Administered 2022-03-16 – 2022-03-17 (×8): 15 mL via OROMUCOSAL

## 2022-03-16 MED ORDER — PROPOFOL 10 MG/ML IV BOLUS
INTRAVENOUS | Status: DC | PRN
  Administered 2022-03-16: 50 mg via INTRAVENOUS

## 2022-03-16 MED ORDER — OXYCODONE HCL 5 MG/5ML PO SOLN: 5.0000 mg | ORAL | Status: DC | PRN

## 2022-03-16 MED ORDER — HYDROMORPHONE HCL 1 MG/ML IJ SOLN
1.0000 mg | Freq: Once | INTRAMUSCULAR | Status: AC
  Administered 2022-03-16: 1 mg via INTRAVENOUS
  Filled 2022-03-16: qty 1

## 2022-03-16 MED ORDER — LIDOCAINE-PRILOCAINE 2.5-2.5 % EX CREA: 1.0000 "application " | TOPICAL_CREAM | CUTANEOUS | Status: DC | PRN

## 2022-03-16 MED ORDER — FENTANYL CITRATE (PF) 100 MCG/2ML IJ SOLN
50.0000 ug | INTRAMUSCULAR | Status: DC | PRN
  Administered 2022-03-17: 50 ug via INTRAVENOUS
  Filled 2022-03-16: qty 2

## 2022-03-16 MED ORDER — FENTANYL CITRATE (PF) 250 MCG/5ML IJ SOLN
INTRAMUSCULAR | Status: DC | PRN
  Administered 2022-03-16: 100 ug via INTRAVENOUS

## 2022-03-16 MED ORDER — HEPARIN SODIUM (PORCINE) 1000 UNIT/ML DIALYSIS: 1000.0000 [IU] | INTRAMUSCULAR | Status: DC | PRN

## 2022-03-16 MED ORDER — PHENYLEPHRINE HCL-NACL 20-0.9 MG/250ML-% IV SOLN
INTRAVENOUS | Status: DC | PRN
  Administered 2022-03-16: 40 ug/min via INTRAVENOUS

## 2022-03-16 MED ORDER — LACTATED RINGERS IV SOLN: INTRAVENOUS | Status: DC

## 2022-03-16 MED ORDER — SODIUM CHLORIDE 0.9 % IV SOLN
250.0000 mL | INTRAVENOUS | Status: DC
Start: 2022-03-16 — End: 2022-04-03
  Administered 2022-03-16: 250 mL via INTRAVENOUS
  Administered 2022-03-17 – 2022-03-18 (×2): 1000 mL via INTRAVENOUS
  Administered 2022-03-20: 250 mL via INTRAVENOUS

## 2022-03-16 MED ORDER — 0.9 % SODIUM CHLORIDE (POUR BTL) OPTIME
TOPICAL | Status: DC | PRN
  Administered 2022-03-16: 1000 mL

## 2022-03-16 MED ORDER — FENTANYL CITRATE (PF) 100 MCG/2ML IJ SOLN
50.0000 ug | INTRAMUSCULAR | Status: DC | PRN
  Administered 2022-03-17: 100 ug via INTRAVENOUS
  Administered 2022-03-17: 50 ug via INTRAVENOUS
  Filled 2022-03-16 (×2): qty 2

## 2022-03-16 MED ORDER — ALBUTEROL SULFATE HFA 108 (90 BASE) MCG/ACT IN AERS
INHALATION_SPRAY | RESPIRATORY_TRACT | Status: DC | PRN
  Administered 2022-03-16: 6 via RESPIRATORY_TRACT

## 2022-03-16 MED ORDER — PHENYLEPHRINE 80 MCG/ML (10ML) SYRINGE FOR IV PUSH (FOR BLOOD PRESSURE SUPPORT)
PREFILLED_SYRINGE | INTRAVENOUS | Status: AC
  Filled 2022-03-16: qty 10

## 2022-03-16 SURGICAL SUPPLY — 26 items
BLADE SURG 15 STRL LF DISP TIS (BLADE) ×1 IMPLANT
BLADE SURG 15 STRL SS (BLADE) ×2
BNDG GAUZE ELAST 4 BULKY (GAUZE/BANDAGES/DRESSINGS) ×1 IMPLANT
CATH FOLEY 2WAY SLVR  5CC 14FR (CATHETERS) ×2
CATH FOLEY 2WAY SLVR 5CC 14FR (CATHETERS) IMPLANT
CATH ROBINSON RED A/P 14FR (CATHETERS) IMPLANT
COVER SURGICAL LIGHT HANDLE (MISCELLANEOUS) ×4 IMPLANT
DRSG PAD ABDOMINAL 8X10 ST (GAUZE/BANDAGES/DRESSINGS) ×1 IMPLANT
ELECT REM PT RETURN 9FT ADLT (ELECTROSURGICAL) ×2
ELECTRODE REM PT RTRN 9FT ADLT (ELECTROSURGICAL) ×1 IMPLANT
GAUZE 4X4 16PLY ~~LOC~~+RFID DBL (SPONGE) ×2 IMPLANT
GLOVE BIOGEL M STRL SZ7.5 (GLOVE) ×2 IMPLANT
GOWN STRL REUS W/ TWL LRG LVL3 (GOWN DISPOSABLE) ×2 IMPLANT
GOWN STRL REUS W/TWL LRG LVL3 (GOWN DISPOSABLE) ×4
KIT BASIN OR (CUSTOM PROCEDURE TRAY) ×2 IMPLANT
KIT TURNOVER KIT B (KITS) ×2 IMPLANT
LEGGING LITHOTOMY PAIR STRL (DRAPES) ×1 IMPLANT
NS IRRIG 1000ML POUR BTL (IV SOLUTION) ×2 IMPLANT
PACK SURGICAL SETUP 50X90 (CUSTOM PROCEDURE TRAY) ×2 IMPLANT
PAD ARMBOARD 7.5X6 YLW CONV (MISCELLANEOUS) ×4 IMPLANT
PENCIL BUTTON HOLSTER BLD 10FT (ELECTRODE) ×2 IMPLANT
SYR CONTROL 10ML LL (SYRINGE) ×2 IMPLANT
TOWEL GREEN STERILE FF (TOWEL DISPOSABLE) ×2 IMPLANT
TUBE CONNECTING 12X1/4 (SUCTIONS) ×2 IMPLANT
WATER STERILE IRR 1000ML POUR (IV SOLUTION) ×2 IMPLANT
YANKAUER SUCT BULB TIP NO VENT (SUCTIONS) ×2 IMPLANT

## 2022-03-16 NOTE — Progress Notes (Addendum)
?PROGRESS NOTE ? ?Jeffrey Ayala  ?DOB: July 27, 1961  ?PCP: Patient, No Pcp Per (Inactive) ?NAT:[id number]  ?[name]: 02/22/2022 ? LOS: 22 days  ?Hospital Day: 23 ? ?Brief narrative: ?Jeffrey Ayala is a 61 y.o. male with PMH significant for arthritis, bilateral knee replacement, gastric ulcer, kidney stones, eosinophilic esophagitis, migraines not on any regular meds.   ?Patient presented to the ED on 4/2 with complaint of nausea, vomiting, diarrhea and confusion for 24 hours.   ?EMS noted him to be in A-fib with a heart rate of 120-160 bpm. ?In the ED, he had a fever of 102.9. ?CT abdomen pelvis showed swollen scrotum concerning for infection, colon filled with diarrhea.  ?He was hypotensive requiring multiple vasopressors in the ICU.   ?4/3, patient required intubation and also had a brief cardiac arrest postintubation.   ?4/5, in an effort to correct vasoplegia, patient was also started on [name] ?Because of septic shock, patient's renal function worsened. ?4/6, patient was started on CRRT ?4/11, patient was extubated. ?4/14, patient was transferred out to [facility] service. ?4/23, underwent left transmetatarsal amputation and right BKA by [name] ? ?Subjective: ?Patient was seen and examined this morning. ?Lying on bed.  Significantly altered this morning.  Looks uncomfortable but not able to specify any source of pain.  Wife at bedside and is emotional to see him suffer. ?Follow-up from urology and orthopedics this morning noted. ?I talked to urologist Dr. Wynetta Emery and nephrologist Dr. Joelyn Oms this morning.  Tentative plan to dialyze today and do a scrotal debridement later today. ? ?Principal Problem: ?  Septic shock ([place]) ?Active Problems: ?  Atrial fibrillation with RVR ([place]) ?  Pressure injury of skin ?  Encounter for central line placement ?  Severe protein-calorie malnutrition ([place]) ?  Altered mental status ?  Gangrene of right foot ([place]) ?  Gangrene of left foot ([place]) ?  Malnutrition of moderate  degree ?  ?Assessment and Plan: ?Septic shock - POA -resolved ?Scrotal cellulitis ?-Patient was in septic shock on admission, thought due to scrotal cellulitis and possible aspiration pneumonia from n/v. ?-While in ICU, patient required multiple vasopressors including Levophed, epinephrine and vasopressin.  Also given a trial of methylene blue.  Eventually taken off pressors but he seems to have suffered multiorgan ischemic damage due to requiring high doses of multiple pressors. ?-Blood culture did not show any growth.  Urine culture with multiple species.  Completed a course of antibiotics. ?-Urology follow-up appreciated.  Defer to urology for the need/timing of scrotal debridement. ?-4/18, patient spiked a fever of 102.4 with tachycardia.  Lactic acid level was normal. ?-Unclear cause of fever but with high suspicion of an infection, broad-spectrum antibiotics were started. ?-Pending plan of scrotal debridement by urology today. ?-Currently on a course of IV cefepime and vancomycin for now.  I will continue the antibiotics now to provide perioperative coverage as he is going to have a scrotal debridement done with in next 24 hours. ? ?Recent Labs  ?Lab 03/10/22 ?1808 03/11/22 ?0559 03/13/22 ?4270 03/13/22 ?6237 03/14/22 ?0148 03/15/22 ?6283 03/16/22 ?0602  ?WBC  --    < > 10.0 9.5 10.5 9.0 12.2*  ?LATICACIDVEN 1.3  --   --   --   --   --   --   ? < > = values in this interval not displayed.  ? ?Dry gangrene of both feet ?-Secondary to vasospasm due to high-dose of vasopressors required for septic shock. ?-Vascular surgery and orthopedics consult appreciated. ?-4/23,  underwent left transmetatarsal amputation and right BKA by [name] ? ?AKI from septic shock -new dialysis dependence ?-Started CRRT 4/64/11, now on iHD ?-4/17, underwent TLC catheter placed ?-Nephrology following.  Making some urine.  Unclear if he will recover out of this. ? ?Acute anemia ?-Baseline hemoglobin normal.  Gradually downtrending  hemoglobin level this hospitalization.  No evidence of active bleeding. ?-Received 3 units of PRBC transfusion hemoglobin stable in last 24 hours. ?Recent Labs  ?  03/13/22 ?1549 03/14/22 ?0148 03/15/22 ?6659 03/15/22 ?9357 03/16/22 ?0602  ?HGB 6.6* 7.4* 8.1* 7.8* 7.7*  ?MCV  --  90.6 91.0  --  90.9  ? ?Acute metabolic encephalopathy ?-Patient continues to have altered mental status, drowsiness, excessive sleepiness although with intermittent slight improvement.  Multifactorial: Sepsis, uremia, possible hypoxemic injury during septic shock.  ?-No evidence of intracranial infection. 4/15, CT head unremarkable. ?-Ammonia level unremarkable. ?-He is significantly more altered and uncomfortable today.  Currently on Seroquel 50 mg nightly.  I will give 1 dose of IV Ativan as well as 25 mg of oral Seroquel this morning to prepare him for surgery today. ? ?Diarrhea ?-Per wife, patient continues to have diarrhea since admission.  Currently patient has a Flexi-Seal in place. ?-4/17, GI pathogen panel negative. ?-Continues to have liquid stool but overall volume is improving after low-dose Imodium was started on 4/20. ?-Patient is passing a lot of gas.  Seems distended as well.  Simethicone was added.  Imodium is currently on hold. ? ?Acute gout flare up ?-Has swollen lesions on both elbows and left hand.  ?-Started on allopurinol and colchicine.  Would avoid prednisone as it can impair wound healing. ? ?New onset of afib, presents on admission ?-echocardiogram with preserved LVEF ?-remains in rate controlled afib ?-Was on heparin drip initially but it was stopped because of drop in hemoglobin. ?-Cardiology consulted. Currently on metoprolol 37.5 mg 3 times daily and aspirin 81 mg daily.  Heart rate remains mostly above 100. ?-If hemoglobin improves, will consider Eliquis at discharge. ?-Per cardiology, plan for outpatient DCCV if remains in A-fib and able to tolerate AC. ? ?Shock liver ?-Liver enzymes elevated likely due to  shock.  Improving numbers.   ?-CT abd did not show hepatobiliary abnormality ? ?Acute hypoxic respiratory failure ?-Initially required intubation, later extubated.  Currently on low-flow oxygen by nasal cannula. ? ?Elevated troponin ?-Likely because of brief cardiac arrest and septic shock. ?-Echo showed EF of 60 to 65% with normal wall motion. ?-No further ischemic evaluation at this time per cardiology. ? ?Morbid obesity  ?-Body mass index is 36.45 kg/m?Marland Kitchen Patient has been advised to make an attempt to improve diet and exercise patterns to aid in weight loss. ? ?Poor oral intake ?-Due to altered mentation.  Dietitian consult appreciated.  Currently getting tube feeding through core track.. ?-We will get a speech therapy evaluation again next 1 to 2 days.  If continues to have dysphagia, PEG tube would be a consideration.  Family is okay to proceed with PEG tube if needed ? ?Goals of care ?  Code Status: Full Code  ? ? ?Mobility: Encourage PT participation once mental status improves ? ?Skin assessment:  ?Pressure Injury 02/22/22 Coccyx Medial Stage 2 -  Partial thickness loss of dermis presenting as a shallow open injury with a red, pink wound bed without slough. pink, painful (Active)  ?02/22/22 2330  ?Location: Coccyx  ?Location Orientation: Medial  ?Staging: Stage 2 -  Partial thickness loss of dermis presenting as a shallow  open injury with a red, pink wound bed without slough.  ?Wound Description (Comments): pink, painful  ?Present on Admission: Yes  ? ? ?Nutritional status:  ?Body mass index is 36.45 kg/m?Marland Kitchen  ?Nutrition Problem: Moderate Malnutrition ?Etiology: acute illness (septic shock, A fib, AKI) ?Signs/Symptoms: mild fat depletion, mild muscle depletion, moderate muscle depletion ? ? ? ? ?Diet:  ?Diet Order   ? ? None  ? ?  ? ? ?DVT prophylaxis:  ?SCD's Start: 03/15/22 1008 ?heparin injection 5,000 Units Start: 03/10/22 0600 ?SCDs Start: 02/22/22 2249 ?  ?Antimicrobials: IV cefepime and vancomycin since  4/18 ?Fluid: None ?Consultants: Nephrology, cardiology ?Family Communication: Discussed with wife at bedside ? ?Status is: Inpatient ? ?Continue in-hospital care because: Continues to have multiple active med

## 2022-03-16 NOTE — Progress Notes (Signed)
PIV for vasopressor consult: [place] PIV placed on 4/23. RN reports she has enough access at this time. ?

## 2022-03-16 NOTE — Anesthesia Preprocedure Evaluation (Addendum)
Anesthesia Evaluation  ?Patient identified by MRN, date of birth, ID band ?Patient confused and Patient unresponsive ? ? ? ?Reviewed: ?Allergy & Precautions, NPO status , Patient's Chart, lab work & pertinent test results, Unable to perform ROS - Chart review onlyPreop documentation limited or incomplete due to emergent nature of procedure. ? ?History of Anesthesia Complications ?Negative for: history of anesthetic complications ? ?Airway ?Mallampati: III ? ?TM Distance: >3 FB ?Neck ROM: Full ? ? ? Dental ? ?(+) Missing, Poor Dentition ?  ?Pulmonary ? ?  ?Pulmonary exam normal ?breath sounds clear to auscultation ? ? ? ? ? ? Cardiovascular ?hypertension, Pt. on medications ?Normal cardiovascular exam ?Rhythm:Regular Rate:Normal ? ?Echo 02/2022 ?1. Left ventricular ejection fraction, by estimation, is 60 to 65%. The left ventricle has normal function. The left ventricle has no regional wall motion abnormalities. There is mild left ventricular hypertrophy. Left ventricular diastolic function could not be evaluated.  ??2. Right ventricular systolic function is normal. The right ventricular size is normal.  ??3. The mitral valve is normal in structure. No evidence of mitral valve regurgitation. No evidence of mitral stenosis.  ??4. The aortic valve is tricuspid. Aortic valve regurgitation is not visualized. No aortic stenosis is present.  ??5. The inferior vena cava is dilated in size with <50% respiratory variability, suggesting right atrial pressure of 15 mmHg.  ?  ?Neuro/Psych ? Headaches, PSYCHIATRIC DISORDERS Depression   ? GI/Hepatic ?PUD,   ?Endo/Other  ?Morbid obesityLab Results ?     Component                Value               Date                 ?     HGBA1C                   5.5                 02/23/2022           ? ? Renal/GU ?ARF and CRFRenal diseaseLab Results ?     Component                Value               Date                 ?     CREATININE               8.71 (H)             03/14/2022           ?     CREATININE               8.81 (H)            03/14/2022           ?Lab Results ?     Component                Value               Date                 ?     K                        4.5  03/14/2022           ?     K                        4.5                 03/14/2022           ?  ? ?  ?Musculoskeletal ? ?(+) Arthritis , gangrene bilateral feet  ? Abdominal ?  ?Peds ? Hematology ? ?(+) Blood dyscrasia, anemia , Lab Results ?     Component                Value               Date                 ?     WBC                      10.5                03/14/2022           ?     HGB                      7.4 (L)             03/14/2022           ?     HCT                      23.0 (L)            03/14/2022           ?     MCV                      90.6                03/14/2022           ?     PLT                      293                 03/14/2022           ?   ?Anesthesia Other Findings ? ? Reproductive/Obstetrics ? ?  ? ? ? ? ? ? ? ? ? ? ? ? ? ?  ?  ? ? ? ? ? ? ?Anesthesia Physical ? ?Anesthesia Plan ? ?ASA: 4 ? ?Anesthesia Plan: General  ? ?Post-op Pain Management:   ? ?Induction: Intravenous ? ?PONV Risk Score and Plan: 2 and Ondansetron, Dexamethasone and Treatment may vary due to age or medical condition ? ?Airway Management Planned: Oral ETT ? ?Additional Equipment:  ? ?Intra-op Plan:  ? ?Post-operative Plan: Extubation in OR ? ?Informed Consent: I have reviewed the patients History and Physical, chart, labs and discussed the procedure including the risks, benefits and alternatives for the proposed anesthesia with the patient or authorized representative who has indicated his/her understanding and acceptance.  ? ? ? ?Dental advisory given and Consent reviewed with POA ? ?Plan Discussed with: CRNA ? ?Anesthesia Plan Comments:   ? ? ? ? ? ?Anesthesia Quick Evaluation ? ?

## 2022-03-16 NOTE — Progress Notes (Addendum)
Pharmacy Antibiotic Note ? ?Jeffrey Ayala is a 61 y.o. male admitted on 02/22/2022 with sepsis.  Pharmacy has been consulted for vancomycin and cefepime dosing. ? ?HD schedule TTS for now, however dialyzing today ? ?Vanc random level checked on 4/22 and was 38 however it was drawn at the end of HD after the vancomycin 1000 mg had been given. Pre-HD vancomycin level 29 today. Scheduled for 3.5h HD session but came off 2h early per HD RN.  ? ?Plan: ?No vancomycin after HD today ?Resume vancomycin 1g qHD with next HD, f/u schedule ?Continue cefepime 1g IV qhs ? ?Height: '5\' 10"'$  (177.8 cm) ?Weight: 115.2 kg (254 lb) ?IBW/kg (Calculated) : 73 ? ?Temp (24hrs), Avg:98.7 ?F (37.1 ?C), Min:98.2 ?F (36.8 ?C), Max:98.9 ?F (37.2 ?C) ? ?Recent Labs  ?Lab 03/10/22 ?1808 03/11/22 ?0559 03/13/22 ?8325 03/13/22 ?4982 03/14/22 ?0148 03/14/22 ?1205 03/15/22 ?6415 03/15/22 ?8309 03/16/22 ?0602 03/16/22 ?1200 03/16/22 ?1343  ?WBC  --    < > 10.0 9.5 10.5  --  9.0  --  12.2*  --   --   ?CREATININE  --    < > 7.10*  --  8.81*  8.71*  --  6.60* 6.70* 8.31*  --  8.93*  ?LATICACIDVEN 1.3  --   --   --   --   --   --   --   --   --   --   ?VANCORANDOM  --   --   --   --   --  38  --   --   --  29  --   ? < > = values in this interval not displayed.  ? ?  ?Estimated Creatinine Clearance: 11 mL/min (A) (by C-G formula based on SCr of 8.93 mg/dL (H)).   ? ?Allergies  ?Allergen Reactions  ? Codeine Hives  ?  Insomnia, jittery  ? Flomax [Tamsulosin Hcl] Nausea Only  ?  Nausea and fatigue  ? ? ?Antimicrobials this admission: ?4/2 flagyl x 1 ?4/2 vanc >> 4/5; 4/18>>  ?4/2 cefepime >> 4/5; 4/18>>  ?4/5 linezolid >> 4/7 ?4/5 pip/tazo >> 4/14 ? ?Dose adjustments this admission: ?4/4 VR 18 ?4/5 VR 15 ? ?Microbiology results: ?4/2 Urine: multiple species ?4/2 Blood: neg ?4/6 trach aspirate: rare Candida albicans ?4/2 C diff: neg ?4/2 MRSA PCR: neg ?4/2 COVID and flu: neg ?4/6 Trach asp- rare GPC, rare GVR, rare candida ?4/17 bcx: ngtd  ?4/18 Bcx  ngtd ? ?Thank you for involving pharmacy in this patient's care. ? ?Renold Genta, PharmD, BCPS ?Clinical Pharmacist ?Clinical phone for 03/16/2022 until 3p is [phone] ?03/16/2022 3:54 PM ? ?**Pharmacist phone directory can be found on [email or web address] listed under [place]** ? ?

## 2022-03-16 NOTE — Progress Notes (Addendum)
Patient ID: Jeffrey Ayala, male   DOB: 21-Apr-1961, 61 y.o.   MRN: [id number] ? ?1 Day Post-Op ?Subjective: ?Pt s/p amputation of left transmetatarsal and right BKA yesterday. ? ?Objective: ?Vital signs in last 24 hours: ?Temp:  [96.7 ?F (35.9 ?C)-98.9 ?F (37.2 ?C)] 98.9 ?F (37.2 ?C) (04/24 0322) ?Pulse Rate:  [91-108] 108 (04/24 0322) ?Resp:  [14-21] 20 (04/24 0322) ?BP: (122-152)/(66-93) 127/66 (04/24 0322) ?SpO2:  [93 %-100 %] 99 % (04/24 0322) ?Weight:  [141.4 kg] 141.4 kg (04/24 0610) ? ?Intake/Output from previous day: ?04/23 0701 - 04/24 0700 ?In: 665 [I.V.:250; Blood:315; IV Piggyback:100] ?Out: 200 [Blood:200] ?Intake/Output this shift: ?No intake/output data recorded. ? ?Physical Exam:  ?General: Awake but confused ?GU: Scrotum is significantly worse compared to Friday with now increased wet gangrenous component along with extensive eschar over anterior scrotum ? ?Lab Results: ?Recent Labs  ?  03/15/22 ?0705 03/15/22 ?9628 03/16/22 ?0602  ?HGB 8.1* 7.8* 7.7*  ?HCT 25.2* 23.0* 24.0*  ? ? ?  Latest Ref Rng & Units 03/16/2022  ?  6:02 AM 03/15/2022  ?  7:35 AM 03/15/2022  ?  7:05 AM  ?CBC  ?WBC 4.0 - 10.5 K/uL 12.2    9.0    ?Hemoglobin 13.0 - 17.0 g/dL 7.7   7.8   8.1    ?Hematocrit 39.0 - 52.0 % 24.0   23.0   25.2    ?Platelets 150 - 400 K/uL 228    257    ? ? ? ?BMET ?Recent Labs  ?  03/15/22 ?0705 03/15/22 ?0735 03/16/22 ?0602  ?NA 132* 132* 133*  ?K 4.3 4.4 5.2*  ?CL 95* 97* 97*  ?CO2 22  --  18*  ?GLUCOSE 118* 114* 141*  ?BUN 72* 91* 105*  ?CREATININE 6.60* 6.70* 8.31*  ?CALCIUM 8.5*  --  8.2*  ? ? ? ?Studies/Results: ?No results found. ? ?Assessment/Plan: ?1) Scrotal wound:  Unfortunately, I was not made aware that patient was going to the OR over the weekend.  I had been under the impression that the patient would be taken to the OR this week.  The scrotum has now progressed on exam and does need to be addressed whether today or within the next few days.  This unfortunately will require another  anesthetic.  I will discuss with the primary team, nephrology, and the patient's wife to finalize a plan for surgical debridement of the scrotum. ? ? LOS: 22 days  ? ?[name] ?03/16/2022, 7:42 AM ? ?Addendum:  I spoke with Dr. Pietro Cassis, Dr. Joelyn Oms, and Mrs. Broce this morning.  Will stop tube feeds and Dr. Marylou Flesher plans for dialysis this morning with plans for debridement of scrotum in OR later today.  Discussed procedure in detail with Mrs. Duan and she agrees/consents to proceed.  ? ?  ?

## 2022-03-16 NOTE — Anesthesia Postprocedure Evaluation (Signed)
Anesthesia Post Note ? ?Patient: DIDIER BRANDENBURG ? ?Procedure(s) Performed: IRRIGATION AND DEBRIDEMENT SCROTUM (Scrotum) ? ?  ? ?Patient location during evaluation: SICU ?Anesthesia Type: General ?Level of consciousness: sedated and patient remains intubated per anesthesia plan ?Pain management: pain level controlled ?Vital Signs Assessment: post-procedure vital signs reviewed and stable ?Respiratory status: patient on ventilator - see flowsheet for VS and patient remains intubated per anesthesia plan ?Cardiovascular status: stable ?Anesthetic complications: no ? ? ?No notable events documented. ? ?Last Vitals:  ?Vitals:  ? 03/16/22 2030 03/16/22 2045  ?BP: (!) 92/54 (!) 83/61  ?Pulse:  95  ?Resp: (!) 22 (!) 22  ?Temp:    ?SpO2:  92%  ?  ?Last Pain:  ?Vitals:  ? 03/16/22 1951  ?TempSrc: Axillary  ?PainSc:   ? ? ?  ?  ?  ?  ?  ?  ? ?Nolon Nations ? ? ? ? ?

## 2022-03-16 NOTE — Transfer of Care (Signed)
Immediate Anesthesia Transfer of Care Note ? ?Patient: Jeffrey Ayala ? ?Procedure(s) Performed: IRRIGATION AND DEBRIDEMENT SCROTUM (Scrotum) ? ?Patient Location: ICU ? ?Anesthesia Type:General ? ?Level of Consciousness: Patient remains intubated per anesthesia plan ? ?Airway & Oxygen Therapy: Patient remains intubated per anesthesia plan and Patient placed on Ventilator (see vital sign flow sheet for setting) ? ?Post-op Assessment: Report given to RN and Post -op Vital signs reviewed and stable ? ?Post vital signs: Reviewed and stable ? ?Last Vitals:  ?Vitals Value Taken Time  ?BP 112/88 03/16/22 1832  ?Temp    ?Pulse 96   ?Resp 22 03/16/22 1843  ?SpO2 97   ?Vitals shown include unvalidated device data. ? ?Last Pain:  ?Vitals:  ? 03/16/22 1604  ?TempSrc:   ?PainSc: Asleep  ?   ? ?  ? ?Complications: No notable events documented. ?

## 2022-03-16 NOTE — Op Note (Signed)
Preoperative diagnosis: Dry gangrene of the anterior scrotum (4 x 6 cm) ? ?Postoperative diagnosis: Dry gangrene of the anterior scrotum (4 x 6 cm) ? ?Procedure: Debridement of scrotum ? ?Surgeon: Pryor Curia MD ? ?Anesthesia: General ? ?Complications: None ? ?Specimen: Tissue culture ? ?Disposition of specimen: Microbiology lab ? ?EBL: Minimal ? ?Indication: Jeffrey Ayala is a 61 year old gentleman who presented to the hospital with sepsis of unclear origin.  He did develop scrotal edema although did not appear to have a primary infection of the scrotum and there was no evidence of necrotizing fasciitis to explain his sepsis.  He did have an area of the anterior scrotum that demarcated and became ecchymotic and subsequently formed a dry eschar over the anterior scrotum.  He also had some minimal area of purulence that gradually developed over 2 weeks at the edges of this wound.  It was recommended that he proceed with debridement to assist with wound healing.  The potential risks, complications, and expected recovery process were discussed with the patient and his wife.  They gave informed consent. ? ?Description of procedure: The patient was taken to the operating room and a general anesthetic was administered.  It was ensured that his current antibiotics with vancomycin and cefepime were up-to-date.  He was placed in the dorsolithotomy position and prepped and draped in usual sterile fashion.  A preoperative timeout was performed.  A Foley catheter was inserted to identify the urethra during the dissection.  Using Bovie electrocautery, the well demarcated eschar was carefully removed down to the underlying subcutaneous tissues which appeared viable.  This area measured approximately 4 x 6 cm and covered the majority of the anterior scrotum.  Both testicles were clearly visible after resection.  Any remaining areas of purulence or necrosis were removed.  At the end of the procedure, the wound was irrigated  and a wet-to-dry Kerlix saline dressing was applied.  The patient tolerated the procedure well without complications.  Due to the fact that he was quite combative prior to intubation, it was recommended by anesthesia that he remain intubated postoperatively.  He will therefore be transferred to the intensive care unit.  I have communicated with both his wife and his primary team. ? ? ?

## 2022-03-16 NOTE — Progress Notes (Signed)
Inpatient Rehabilitation Admissions Coordinator  ? ?I met at bedside with patient and his wife. Patient very restless and confused. Not following commands. I began discussions with his wife of rehab at [facility] expectations and goals. Discussed the need to see how he progresses with tolerance and participation before determining most appropriate rehab venue. I will follow his progress. Noted plans for OR today. ? ?Danne Baxter, RN, MSN ?Rehab Admissions Coordinator ?[phone] ?03/16/2022 12:20 PM ? ?

## 2022-03-16 NOTE — Progress Notes (Signed)
OT Cancellation Note ? ?Patient Details ?Name: Jeffrey Ayala ?MRN: [id number] ?DOB: [birth year] ? ? ?Cancelled Treatment:    Reason Eval/Treat Not Completed: Other (comment) Per RN, pt agitated and just received sedating medication. Also note per chart review pt might return to OR today and planned HD afterwards. Will plan to follow-up tomorrow for OT re-evaluation. ? ?[name] ?03/16/2022, 10:24 AM ?

## 2022-03-16 NOTE — Progress Notes (Signed)
Patient ID: Jeffrey Ayala, male   DOB: [birth year], 61 y.o.   MRN: [id number] ?Is a 61 year old gentleman who is postoperative day 1 left transmetatarsal amputation and right transtibial amputation.  There is no drainage in the wound VAC canister.  Patient has restraints on this morning.  He is not alert or oriented. ?

## 2022-03-16 NOTE — Anesthesia Procedure Notes (Signed)
Procedure Name: Intubation ?Date/Time: 03/16/2022 5:26 PM ?Performed by: Thelma Comp, CRNA ?Pre-anesthesia Checklist: Patient identified, Emergency Drugs available, Suction available and Patient being monitored ?Patient Re-evaluated:Patient Re-evaluated prior to induction ?Oxygen Delivery Method: Circle System Utilized ?Preoxygenation: Pre-oxygenation with 100% oxygen ?Induction Type: IV induction, Rapid sequence and Cricoid Pressure applied ?Laryngoscope Size: Mac and 4 ?Grade View: Grade I ?Tube type: Oral ?Tube size: 7.5 mm ?Number of attempts: 1 ?Airway Equipment and Method: Stylet ?Placement Confirmation: ETT inserted through vocal cords under direct vision, positive ETCO2 and breath sounds checked- equal and bilateral ?Secured at: 23 cm ?Tube secured with: Tape ?Dental Injury: Teeth and Oropharynx as per pre-operative assessment  ?Comments: Bloody oropharynx in short stay. Pt oral baseline after intubation. ? ? ? ? ?

## 2022-03-16 NOTE — Progress Notes (Signed)
?[facility] ?Progress Note  ? ? ?Assessment/ Plan:   ?AKI: in the setting of severe septic shock likely ATN.  Normal GFR baseline ?started CRRT 02/26/22 - 4/11, now on iHD ?R IJ TDC ?HD today 4/24 with K 5.2, azotemia, and plan for OR ?Septic Shock resolved ?Scrotal Edema / wound with plan for debridement today with urology ?S/p L TMA and R BKA 03/15/22 with [name] ?Acute hypoxic RF: Now extubated and improved.  Continue to monitor ?Thrombocytopenia/ coagulopathy: resolved ?Anemia: Arensp qSat.  No Fe with infection.  Hb 7s. Transfusions if needed ?Afib with RVR- hep--> bivalirudin gtt -> now holding anticoagulation ?Foot wounds: associated with high-dose vasopressor requirements. Vascular and orthopedic surgery onboard. S/p rt BKA and lt TMA on 4/23. ?Hyperphosphatemia: Sevelamer -> incr to 3 tabs TIDM ?  ?Subjective:   ? ?For OR today with urology ?UOP not captured ?SCr up to 5.2, BUN 105.   ?Uncomfortable, in pain ?  ? ?Objective:   ?BP 127/66 (BP Location: Right Arm)   Pulse (!) 108   Temp 98.9 ?F (37.2 ?C) (Axillary)   Resp 20   Ht '5\' 10"'  (1.778 m)   Wt (!) 141.4 kg   SpO2 99%   BMI 44.73 kg/m?  ? ?Intake/Output Summary (Last 24 hours) at 03/16/2022 0919 ?Last data filed at 03/15/2022 1320 ?Gross per 24 hour  ?Intake 0 ml  ?Output 0 ml  ?Net 0 ml  ? ? ?Weight change: -0 kg ? ?Physical Exam: ?GEN: activated, uncomfortable appearing ?HEENT eyes closed ?PULM bilateral chest rise with no increased work of breathing ?CV Normal rate, no audible rub ?ABD distended, soft, bowel sounds present ?GU: swollen scrotum w/ dry gangrene ?EXT right BKA, left TMA with wound vac ?ACCESS: RIJ TC ? ?Imaging: ?No results found. ? ?Labs: ?BMET ?Recent Labs  ?Lab 03/10/22 ?8887 03/11/22 ?0559 03/12/22 ?0134 03/13/22 ?5797 03/14/22 ?0148 03/15/22 ?2820 03/15/22 ?6015 03/16/22 ?0602  ?NA 134* 137 136 134* 132*  133* 132* 132* 133*  ?K 5.2* 4.3 4.3 4.2 4.5  4.5 4.3 4.4 5.2*  ?CL 97* 98 97* 97* 96*  96* 95* 97* 97*   ?CO2 17* 22 20* 21* 18*  18* 22  --  18*  ?GLUCOSE 129* 123* 131* 130* 130*  130* 118* 114* 141*  ?BUN 102* 64* 92* 66* 97*  96* 72* 91* 105*  ?CREATININE 11.60* 7.31* 9.44* 7.10* 8.81*  8.71* 6.60* 6.70* 8.31*  ?CALCIUM 8.0* 8.5* 8.4* 8.5* 8.3*  8.3* 8.5*  --  8.2*  ?PHOS  --  8.9* 10.6* 8.4* 9.9* 7.0*  --  8.1*  ? ? ?CBC ?Recent Labs  ?Lab 03/10/22 ?6153 03/11/22 ?0559 03/13/22 ?7943 03/13/22 ?1549 03/14/22 ?0148 03/15/22 ?2761 03/15/22 ?4709 03/16/22 ?0602  ?WBC 12.4*   < > 9.5  --  10.5 9.0  --  12.2*  ?NEUTROABS 9.9*  --   --   --  8.3* 7.1  --  10.1*  ?HGB 7.1*   < > 5.9*   < > 7.4* 8.1* 7.8* 7.7*  ?HCT 23.1*   < > 19.1*   < > 23.0* 25.2* 23.0* 24.0*  ?MCV 93.9   < > 93.6  --  90.6 91.0  --  90.9  ?PLT 330   < > 295  --  293 257  --  228  ? < > = values in this interval not displayed.  ? ? ? ?Medications:   ? ? sodium chloride   Intravenous Once  ? allopurinol  300 mg Per Tube Daily  ? vitamin C  1,000 mg Per Tube Daily  ? aspirin EC  81 mg Oral Daily  ? chlorhexidine gluconate (MEDLINE KIT)  15 mL Mouth Rinse BID  ? Chlorhexidine Gluconate Cloth  6 each Topical Q0600  ? darbepoetin (ARANESP) injection - DIALYSIS  200 mcg Intravenous Q Sat-HD  ? docusate  100 mg Per Tube Daily  ? dronabinol  2.5 mg Oral QAC lunch  ? feeding supplement  237 mL Per Tube QID  ? feeding supplement (PROSource TF)  45 mL Per Tube QID  ? folic acid  1 mg Per Tube Daily  ? guaiFENesin  600 mg Oral BID  ? heparin injection (subcutaneous)  5,000 Units Subcutaneous Q8H  ? insulin aspart  0-15 Units Subcutaneous Q4H  ? leptospermum manuka honey  1 application. Topical Daily  ? mouth rinse  15 mL Mouth Rinse BID  ? melatonin  3 mg Per Tube QHS  ? metoprolol tartrate  37.5 mg Per Tube TID  ? multivitamin  1 tablet Per Tube QHS  ? mupirocin ointment  1 application. Nasal BID  ? nutrition supplement (JUVEN)  1 packet Per Tube BID BM  ? pantoprazole sodium  40 mg Per Tube Daily  ? QUEtiapine  50 mg Per Tube QHS  ? sevelamer carbonate   2,400 mg Per Tube TID WC  ? sodium chloride flush  10-40 mL Intracatheter Q12H  ? zinc sulfate  220 mg Per Tube Daily  ? ? ? ?Rexene Agent ? ?03/16/2022, 9:19 AM   ?

## 2022-03-16 NOTE — Progress Notes (Signed)
eLink Physician-Brief Progress Note ?Patient Name: Jeffrey Ayala ?DOB: 06-Aug-1961 ?MRN: [id number] ? ? ?Date of Service ? 03/16/2022  ?HPI/Events of Note ? ABG reviewed, FIO2 reduced to 40 %, Calcium gluconate 1 gm iv x 1 ordered.  ?eICU Interventions ? See above.  ? ? ? ?  ? ?[name] ?03/16/2022, 8:57 PM ?

## 2022-03-16 NOTE — Progress Notes (Signed)
eLink Physician-Brief Progress Note ?Patient Name: Jeffrey Ayala ?DOB: January 03, 1961 ?MRN: [id number] ? ? ?Date of Service ? 03/16/2022  ?HPI/Events of Note ? Patient is on Propofol and Phenylephrine gtt without orders in the chart.  ?eICU Interventions ? Propofol and Phenylephrine gtt orders entered.  ? ? ? ?  ? ?Kerry Kass [place] ?03/16/2022, 7:55 PM ?

## 2022-03-16 NOTE — Consult Note (Signed)
? ? ? ?NAME:  Jeffrey Ayala, MRN:  [id number], DOB:  Sep 08, 1961, LOS: 22 ?ADMISSION DATE:  02/22/2022, CONSULTATION DATE:  4/2 ?REFERRING MD:  Durwin Nora- ED CHIEF COMPLAINT: Septic shock ? ?History of Present Illness:  ?Jeffrey Ayala, is a 61 y.o. male, who presented to the [facility] [place] ED with a chief complaint of nausea, vomiting, diarrhea. ? ?They have a pertinent past medical history of gastric ulcer, kidney stones, eosinophilic esophagitis, migraines  ? ?History obtained from Dyron Dotzler (spouse), on 4/1 patient began complaining of feeling unwell. Wife denies sick contacts, questionable food.  Wife denies taking any meds currently.  Around 2 PM on 4/2 the patient developed nausea, vomiting, and diarrhea while at a birthday party and soiled himself. EMS was called. When EMS arrived he was found to be in A-fib with a rate of 120-160. ? ?Pt was admitted to the ICU for septic shock of unclear source and required intubation and multiple vasopressors.  Had brief cardiac arrest and worsening renal function eventually requiring CRRT.  He was eventually extubated and transferred to the floor, unfortunately developed bilateral dry gangrene of both feet secondary to high dose vasopressors and required bilateral transmetarsal amputation.   He spiked a fever 4/18 and was resumed on broad spectrum abx.  Early in hospital course noted to have possible scrotal cellulitis, but not suspicious for deep infection.   He had an area on the anterior testicle that became ecchymotic and with dry eschar, so was taken to the OR on 4/24 for debridement.  The underlying tissue was viable.  He required low dose pressors and was left intubated secondary to agitation prior to the procedure, so PCCM was re-consulted.  ? ?Pertinent  Medical History  ?gastric ulcer, kidney stones, eosinophilic esophagitis, migraines  ? ?Significant Hospital Events: ?Including procedures, antibiotic start and stop dates in addition to other pertinent events   ?4/2  Presented to the ED. CT chest negative for acute process.  CT abdomen pelvis with and without contrast notable for swollen scrotum concerning for infection, colon filled with diarrhea.  Urology and cardiology were consulted by the ED. Vanc, cefepime started.  ?4/3 Intubated overnight, brief cardiac arrest. Escalating pressors. ?4/5 started on methylene blue ?4/6 started on CRRT ?4/8 appears more responsive ?4/9 responsive ?4/10 Extubated, PT rec IPR ?4/11 Vascular consulted for foot wounds, bival stopped ?4/12 1st IHD ?4/14 transferred to [facility] ?4/23 underwent left transmetatarsal amputation and right BKA by [name] ? 4/24 scrotal debridement, on low dose Neo and left intubated, transferred back to ICU ? ? ?Interim History / Subjective:  ?Pt intubated and on decreasing Neosynephrine ? ?Objective   ?Blood pressure (!) 83/61, pulse 95, temperature 98.3 ?F (36.8 ?C), temperature source Axillary, resp. rate (!) 22, height 5\' 10"  (1.778 m), weight 115.8 kg, SpO2 92 %.  ?   ?Vent Mode: PRVC ?FiO2 (%):  [40 %-100 %] 40 % ?Set Rate:  [22 bmp] 22 bmp ?Vt Set:  [580 mL] 580 mL ?PEEP:  [8 cmH20] 8 cmH20 ?Plateau Pressure:  [20 cmH20-23 cmH20] 20 cmH20  ? ?Intake/Output Summary (Last 24 hours) at 03/16/2022 2221 ?Last data filed at 03/16/2022 1900 ?Gross per 24 hour  ?Intake 2391.01 ml  ?Output -465 ml  ?Net 2856.01 ml  ? ? ?Filed Weights  ? 03/16/22 0610 03/16/22 1322 03/16/22 1604  ?Weight: (!) 141.4 kg 115.2 kg 115.8 kg  ? ? ?General:  ill-appearing M, intubated and sedated ?HEENT: MM pink/moist, ETT in place, pupils equal, sclera anicteric  ?  Neuro: examined on propofol, RASS -4 ?CV: s1s2 rrr, no m/r/g ?PULM:  mechanical breath sounds bilaterally without wheezing or rhonchi ?GI: soft, bsx4 active  ?GU: condom cath, scrotal dressing in place without bleeding ?Extremities: warm/dry, bilateral bandaged feet   ?Skin: no rashes or lesions ? ? ?Assessment & Plan:  ? ? ? ? ?Scrotal cellulitis ?Hypotension ?Expected post-op vent  management ?S/p debridement, had viable tissue underneath  ?-suspect hypotension is related to sedation, continue peripheral Neosynephrine to maintain MAP >65 ?-has been on vanc/cefepime, wound culture pending, suspect can d/c Vanc ?-continue fentanyl and propofol overnight ?-left intubated 2/2 agitation ?--Maintain full vent support with SAT/SBT as tolerated ?-titrate Vent setting to maintain SpO2 greater than or equal to 90%. ?-HOB elevated 30 degrees. ?-Plateau pressures less than 30 cm H20.  ?-Follow chest x-ray, ABG prn.   ?-Bronchial hygiene and RT/bronchodilator protocol. ? ? ? ?Acute Encephalopathy ?Head CT 4/15 ?-in the setting of critical illness, shock and prolonged hospital course, likely multifactorial.  Ammonia was unremarkable ?-attempt SBT/SAT in the AM ? ?Renal failure, initially acute from sepsis, now dialysis dependent ?-has tunneled catheter, continue iHD, nephrology following ? ? ?Persistent atrial fibrillation with RVR ?New on admission, likely secondary to sepsis.  ?-cardiology following, rate controlled with metoprolol ?-continue Asa ?-was on heparin, stopped 2/2 hgb drop ?-will likely need DOAC ? ? ? ? ?Best Practice (right click and "Reselect all SmartList Selections" daily)  ? ?Diet/type: NPO ?DVT prophylaxis: heparin ?GI prophylaxis: PPI ?Lines: N/A ?Foley:  N/A ?Code Status:  full code ?Last date of multidisciplinary goals of care discussion: 4/12   ? ? ? ? ?CRITICAL CARE ?Performed by: Darcella Gasman Taleigha Pinson ? ? ?Total critical care time: 35 minutes ? ?Critical care time was exclusive of separately billable procedures and treating other patients. ? ?Critical care was necessary to treat or prevent imminent or life-threatening deterioration. ? ?Critical care was time spent personally by me on the following activities: development of treatment plan with patient and/or surrogate as well as nursing, discussions with consultants, evaluation of patient's response to treatment, examination of patient,  obtaining history from patient or surrogate, ordering and performing treatments and interventions, ordering and review of laboratory studies, ordering and review of radiographic studies, pulse oximetry and re-evaluation of patient's condition. ? ?Darcella Gasman Lavoy Bernards, PA-C ?[facility] ?See Amion for pager ?If no response to pager , please call [phone] until 7pm ?After 7:00 pm call Elink  [phone] ? ? ? ?

## 2022-03-16 NOTE — Progress Notes (Signed)
PT Cancellation Note ? ?Patient Details ?Name: Jeffrey Ayala ?MRN: [id number] ?DOB: June 18, 1961 ? ? ?Cancelled Treatment:    Reason Eval/Treat Not Completed: Other (comment) Per RN, pt agitated and just received sedating medication. Also note per chart review pt might return to OR today. ? ?Wyona Almas, PT, DPT ?Acute Rehabilitation Services ?Pager [phone] ?Office [phone] ? ? ? ?Carloine Margo Aye ?03/16/2022, 10:19 AM ?

## 2022-03-17 ENCOUNTER — Encounter (HOSPITAL_COMMUNITY): Payer: Self-pay | Admitting: Urology

## 2022-03-17 DIAGNOSIS — A419 Sepsis, unspecified organism: Secondary | ICD-10-CM | POA: Diagnosis not present

## 2022-03-17 DIAGNOSIS — R6521 Severe sepsis with septic shock: Secondary | ICD-10-CM | POA: Diagnosis not present

## 2022-03-17 LAB — GLUCOSE, CAPILLARY
Glucose-Capillary: 101 mg/dL — ABNORMAL HIGH (ref 70–99)
Glucose-Capillary: 109 mg/dL — ABNORMAL HIGH (ref 70–99)
Glucose-Capillary: 119 mg/dL — ABNORMAL HIGH (ref 70–99)
Glucose-Capillary: 121 mg/dL — ABNORMAL HIGH (ref 70–99)
Glucose-Capillary: 132 mg/dL — ABNORMAL HIGH (ref 70–99)
Glucose-Capillary: 137 mg/dL — ABNORMAL HIGH (ref 70–99)

## 2022-03-17 LAB — TYPE AND SCREEN
ABO/RH(D): A POS
Antibody Screen: NEGATIVE
Unit division: 0
Unit division: 0
Unit division: 0
Unit division: 0
Unit division: 0

## 2022-03-17 LAB — BPAM RBC
Blood Product Expiration Date: 202304302359
Blood Product Expiration Date: 202305082359
Blood Product Expiration Date: 202305092359
Blood Product Expiration Date: 202305092359
Blood Product Expiration Date: 202305092359
ISSUE DATE / TIME: 202304211104
ISSUE DATE / TIME: 202304211826
ISSUE DATE / TIME: 202304230738
ISSUE DATE / TIME: 202304230738
Unit Type and Rh: 6200
Unit Type and Rh: 6200
Unit Type and Rh: 6200
Unit Type and Rh: 6200
Unit Type and Rh: 6200

## 2022-03-17 LAB — RENAL FUNCTION PANEL
Albumin: 1.9 g/dL — ABNORMAL LOW (ref 3.5–5.0)
Anion gap: 16 — ABNORMAL HIGH (ref 5–15)
BUN: 73 mg/dL — ABNORMAL HIGH (ref 8–23)
CO2: 19 mmol/L — ABNORMAL LOW (ref 22–32)
Calcium: 8 mg/dL — ABNORMAL LOW (ref 8.9–10.3)
Chloride: 95 mmol/L — ABNORMAL LOW (ref 98–111)
Creatinine, Ser: 6.55 mg/dL — ABNORMAL HIGH (ref 0.61–1.24)
GFR, Estimated: 9 mL/min — ABNORMAL LOW (ref >60)
Glucose, Bld: 109 mg/dL — ABNORMAL HIGH (ref 70–99)
Phosphorus: 7.2 mg/dL — ABNORMAL HIGH (ref 2.5–4.6)
Potassium: 4.5 mmol/L (ref 3.5–5.1)
Sodium: 130 mmol/L — ABNORMAL LOW (ref 135–145)

## 2022-03-17 LAB — TRIGLYCERIDES: Triglycerides: 159 mg/dL — ABNORMAL HIGH (ref <150)

## 2022-03-17 MED ORDER — OXYCODONE HCL 5 MG PO TABS
5.0000 mg | ORAL_TABLET | ORAL | Status: DC
  Administered 2022-03-17 – 2022-03-20 (×18): 5 mg
  Filled 2022-03-17 (×18): qty 1

## 2022-03-17 MED ORDER — SODIUM CHLORIDE 0.9% FLUSH
3.0000 mL | Freq: Two times a day (BID) | INTRAVENOUS | Status: DC
  Administered 2022-03-17 – 2022-04-02 (×30): 3 mL via INTRAVENOUS

## 2022-03-17 MED ORDER — ALLOPURINOL 100 MG PO TABS
100.0000 mg | ORAL_TABLET | Freq: Every day | ORAL | Status: DC
  Administered 2022-03-18 – 2022-03-23 (×6): 100 mg
  Filled 2022-03-17 (×6): qty 1

## 2022-03-17 MED ORDER — POLYETHYLENE GLYCOL 3350 17 G PO PACK: 17.0000 g | PACK | Freq: Every day | ORAL | Status: DC | PRN

## 2022-03-17 MED ORDER — FENTANYL CITRATE (PF) 100 MCG/2ML IJ SOLN
50.0000 ug | INTRAMUSCULAR | Status: DC | PRN
  Administered 2022-03-18: 100 ug via INTRAVENOUS
  Filled 2022-03-17: qty 2

## 2022-03-17 MED ORDER — DEXMEDETOMIDINE HCL IN NACL 400 MCG/100ML IV SOLN
0.4000 ug/kg/h | INTRAVENOUS | Status: DC
  Administered 2022-03-17: 0.4 ug/kg/h via INTRAVENOUS
  Filled 2022-03-17: qty 100

## 2022-03-17 NOTE — Progress Notes (Signed)
Patient ID: Jeffrey Ayala, male   DOB: 1960/11/30, 61 y.o.   MRN: [id number] ? ?1 Day Post-Op ?Subjective: ?S/P debridement of scrotum last evening.  No evidence of active infection noted.  Significant eschar of anterior scrotal skin removed.  He has remained intubated overnight due to agitation. ? ? ?Objective: ?Vital signs in last 24 hours: ?Temp:  [98.3 ?F (36.8 ?C)-100.2 ?F (37.9 ?C)] 99.2 ?F (37.3 ?C) (04/25 3212) ?Pulse Rate:  [74-170] 87 (04/25 0615) ?Resp:  [18-30] 22 (04/25 0615) ?BP: (83-132)/(37-100) 102/67 (04/25 0615) ?SpO2:  [92 %-100 %] 100 % (04/25 0615) ?FiO2 (%):  [40 %-100 %] 40 % (04/25 0351) ?Weight:  [115.2 kg-118.2 kg] 118.2 kg (04/25 0500) ? ?Intake/Output from previous day: ?04/24 0701 - 04/25 0700 ?In: 2903.1 [I.V.:2238.1; NG/GT:215; IV Piggyback:450] ?Out: -465  ?Intake/Output this shift: ?No intake/output data recorded. ? ?Physical Exam:  ?General: Intubated and sedated ?GU: Scrotal wound appears clean.  Dressing changed ? ? ?Lab Results: ?Recent Labs  ?  03/16/22 ?0602 03/16/22 ?1703 03/16/22 ?2004  ?HGB 7.7* 8.5* 9.9*  ?HCT 24.0* 25.0* 29.0*  ? ? ?  Latest Ref Rng & Units 03/16/2022  ?  8:04 PM 03/16/2022  ?  5:03 PM 03/16/2022  ?  6:02 AM  ?CBC  ?WBC 4.0 - 10.5 K/uL   12.2    ?Hemoglobin 13.0 - 17.0 g/dL 9.9   8.5   7.7    ?Hematocrit 39.0 - 52.0 % 29.0   25.0   24.0    ?Platelets 150 - 400 K/uL   228    ? ? ? ?BMET ?Recent Labs  ?  03/16/22 ?0602 03/16/22 ?1343 03/16/22 ?1703 03/16/22 ?2004  ?NA 133* 134* 134* 131*  ?K 5.2* 5.1 4.7 4.2  ?CL 97* 99 100  --   ?CO2 18* 20*  --   --   ?GLUCOSE 141* 115* 113*  --   ?BUN 105* 109* 63*  --   ?CREATININE 8.31* 8.93* 5.80*  --   ?CALCIUM 8.2* 8.2*  --   --   ? ? ? ?Studies/Results: ?No results found. ? ?Assessment/Plan: ?Scrotal wound: S/P debridement 4/24.  Local wound care with wet to dry saline dressing changes.  Will consider scrotal skin repair vs skin grafting in future after wound is clean and patient has significant improved his overall  medical condition. ? ? LOS: 23 days  ? ?[name] ?03/17/2022, 7:22 AM ? ?  ?

## 2022-03-17 NOTE — Progress Notes (Signed)
? ? ? ?NAME:  Jeffrey Ayala, MRN:  [id number], DOB:  06-14-1961, LOS: 65 ?ADMISSION DATE:  02/22/2022, CONSULTATION DATE:  4/2 ?REFERRING MD:  Jeffrey Ayala- ED CHIEF COMPLAINT: Septic shock ? ?History of Present Illness:  ?[name], is a 61 y.o. male, who presented to the [facility] ED with a chief complaint of nausea, vomiting, diarrhea. ? ?They have a pertinent past medical history of gastric ulcer, kidney stones, eosinophilic esophagitis, migraines  ? ?History obtained from Jeffrey Ayala (spouse), on 4/1 patient began complaining of feeling unwell. Wife denies sick contacts, questionable food.  Wife denies taking any meds currently.  Around 2 PM on 4/2 the patient developed nausea, vomiting, and diarrhea while at a birthday party and soiled himself. EMS was called. When EMS arrived he was found to be in A-fib with a rate of 120-160. ? ?Pt was admitted to the ICU for septic shock of unclear source and required intubation and multiple vasopressors.  Had brief cardiac arrest and worsening renal function eventually requiring CRRT.  He was eventually extubated and transferred to the floor, unfortunately developed bilateral dry gangrene of both feet secondary to high dose vasopressors and required bilateral transmetarsal amputation.   He spiked a fever 4/18 and was resumed on broad spectrum abx.  Early in hospital course noted to have possible scrotal cellulitis, but not suspicious for deep infection.   He had an area on the anterior testicle that became ecchymotic and with dry eschar, so was taken to the OR on 4/24 for debridement.  The underlying tissue was viable.  He required low dose pressors and was left intubated secondary to agitation prior to the procedure, so PCCM was re-consulted.  ? ?Pertinent  Medical History  ?gastric ulcer, kidney stones, eosinophilic esophagitis, migraines  ? ?Significant Hospital Events: ?Including procedures, antibiotic start and stop dates in addition to other pertinent events   ?4/2  Presented to the ED. CT chest negative for acute process.  CT abdomen pelvis with and without contrast notable for swollen scrotum concerning for infection, colon filled with diarrhea.  Urology and cardiology were consulted by the ED. Vanc, cefepime started.  ?4/3 Intubated overnight, brief cardiac arrest. Escalating pressors. ?4/5 started on methylene blue ?4/6 started on CRRT ?4/8 appears more responsive ?4/9 responsive ?4/10 Extubated, PT rec IPR ?4/11 Vascular consulted for foot wounds, bival stopped ?4/12 1st IHD ?4/14 [facility] to [facility] ?4/23 underwent left transmetatarsal amputation and right BKA by Dr. Sharol Ayala ? 4/24 scrotal debridement, on low dose Neo and left intubated, transferred back to ICU ? ? ?Interim History / Subjective:  ?No events. ?Minimal vent support. ?Wife at bedside. ? ?Objective   ?Blood pressure (!) 105/59, pulse 96, temperature 99.2 ?F (37.3 ?C), temperature source Oral, resp. rate (!) 22, height '5\' 10"'$  (1.778 m), weight 118.2 kg, SpO2 96 %.  ?   ?Vent Mode: PRVC ?FiO2 (%):  [40 %-100 %] 40 % ?Set Rate:  [22 bmp] 22 bmp ?Vt Set:  [580 mL] 580 mL ?PEEP:  [5 cmH20-8 cmH20] 5 cmH20 ?Plateau Pressure:  [14 cmH20-23 cmH20] 18 cmH20  ? ?Intake/Output Summary (Last 24 hours) at 03/17/2022 0936 ?Last data filed at 03/17/2022 0800 ?Gross per 24 hour  ?Intake 2944.79 ml  ?Output -465 ml  ?Net 3409.79 ml  ? ? ?Filed Weights  ? 03/16/22 1322 03/16/22 1604 03/17/22 0500  ?Weight: 115.2 kg 115.8 kg 118.2 kg  ? ? ?Heavily sedated on vent ?Scrotal dressing without strikethrough ?Drains on both legs minimal output ?Mild anasarca ?  Lungs clear ?Heart sounds irregular, afib on monitor ?Ext warm ? ?ABG, H/H okay ?No CXR ? ?Patient Lines/Drains/Airways Status   ? ? Active Line/Drains/Airways   ? ? Name Placement date Placement time Site Days  ? Peripheral IV 03/15/22 20 G 2.5" Left;Lateral Forearm 03/15/22  1207  Forearm  2  ? Peripheral IV 03/16/22 18 G Left;Posterior Hand 03/16/22  1658  Hand  1  ? Hemodialysis  Catheter Right Subclavian 03/09/22  0847  Subclavian  8  ? Negative Pressure Wound Therapy Knee Right;Left;Anterior 11/04/18  1015  --  1229  ? Negative Pressure Wound Therapy Leg Anterior;Right 03/15/22  0900  --  2  ? Negative Pressure Wound Therapy Foot Anterior;Left 03/15/22  0900  --  2  ? Airway 7.5 mm 03/16/22  1726  -- 1  ? Incision (Closed) 11/04/18 Knee Left 11/04/18  1051  -- 1229  ? Incision (Closed) 11/04/18 Knee Right 11/04/18  1051  -- 1229  ? Incision (Closed) 03/15/22 Leg Right 03/15/22  0924  -- 2  ? Incision (Closed) 03/15/22 Leg Left 03/15/22  0931  -- 2  ? Incision (Closed) 03/16/22 Scrotum 03/16/22  1809  -- 1  ? Small Bore Feeding Tube 10 Fr. Left nare Marking at nare/corner of mouth 69 cm 03/09/22  1523  Left nare  8  ? Pressure Injury 02/22/22 Coccyx Medial Stage 2 -  Partial thickness loss of dermis presenting as a shallow open injury with a red, pink wound bed without slough. pink, painful 02/22/22  2330  -- 23  ? Wound / Incision (Open or Dehisced) 03/01/22 Skin tear Thigh Left;Posterior;Proximal large superficial open area 03/01/22  0800  Thigh  16  ? Wound / Incision (Open or Dehisced) 03/05/22 Other (Comment) Scrotum Bilateral Sloughing and macerated scrotum 03/05/22  2000  Scrotum  12  ? Wound / Incision (Open or Dehisced) 03/05/22 Other (Comment) Foot Anterior;Right Necrotic foot and all toes 03/05/22  2000  Foot  12  ? Wound / Incision (Open or Dehisced) 03/05/22 Other (Comment) Foot Anterior;Left Necrotic foot and all toes 03/05/22  2000  Foot  12  ? ?  ?  ? ?  ? ? ? ?Assessment & Plan:  ? ? ?Scrotal cellulitis ?Hypotension ?Expected post-op vent management ?Acute Encephalopathy ?S/p debridement, had viable tissue underneath  ?-suspect hypotension is related to sedation, continue peripheral Neosynephrine to maintain MAP >65 ?-has been on vanc/cefepime, f/u, would cultures overnight ?- Wean to extubate today with precedex as bridge PRN ? ?Post op pain control- will do oxycodone  standing for now, may need adjunctive ketamine/tylenol etc ? ?Renal failure, initially acute from sepsis, now dialysis dependent ?-has tunneled catheter, continue iHD, nephrology following ? ?Persistent atrial fibrillation with RVR ?New on admission, likely secondary to sepsis.  ?-cardiology following, rate controlled with metoprolol ?-continue Asa ?-was on heparin gtt, stopped 2/2 hgb drop ?-CHADS2vasc score 1 does not warrant AC at present, can do heparin ppx dose ? ?S/P L TMA and R TTA- wound vac management and further plans per Dr. Jess Barters team ? ? ?Best Practice (right click and "Reselect all SmartList Selections" daily)  ? ?Diet/type: resume TF ?DVT prophylaxis: heparin ?GI prophylaxis: PPI ?Lines: N/A ?Foley:  N/A ?Code Status:  full code ?Last date of multidisciplinary goals of care discussion: wife updated 4/26, full scope tx   ? ?Will take over as primary for at least today ? ?37 min cc time ?Erskine Emery MD PCCM ? ?

## 2022-03-17 NOTE — Progress Notes (Signed)
OT Cancellation Note ? ?Patient Details ?Name: MATEUSZ NEILAN ?MRN: [id number] ?DOB: [birth year] ? ? ?Cancelled Treatment:    Reason Eval/Treat Not Completed: Fatigue/lethargy limiting ability to participate;Other (comment) Staff working to wean pt for extubation today. Pt currently lethargic and not able to participate in therapy re-evals. Will follow-up as schedule permits for therapy readiness.  ? ?[name] ?03/17/2022, 12:05 PM ?

## 2022-03-17 NOTE — Progress Notes (Signed)
?[facility] ?Progress Note  ? ? ?Assessment/ Plan:   ?Anuric dialysis dependent AKI: in the setting of severe septic shock likely ATN.  Normal GFR baseline ?started CRRT 02/26/22 - 4/11, now on iHD ?R IJ TDC ?Tentative HD on MWF schedule moving forward: 2K, 3.5h, TDC, No heparin, 1-2L UF ?Septic Shock resolved ?Scrotal Edema / wound s/p debridement 4/24 with urology ?S/p L TMA and R BKA 03/15/22 with [name] ?Acute hypoxic RF: recurrent intubation post op 4/24, per CCM ?Thrombocytopenia/ coagulopathy: resolved ?Anemia: Arensp qSat.  No Fe with infection.  Transfusions if needed ?Afib with RVR- hep--> bivalirudin gtt -> now holding anticoagulation ?Foot wounds: associated with high-dose vasopressor requirements. Vascular and orthopedic surgery onboard. S/p rt BKA and lt TMA on 4/23. ?Hyperphosphatemia: Sevelamer 3 tabs TIDM when eating ?  ?Subjective:   ? ?HD yesterday prior to OR where his ant scrotum was debrided; 0.5L UF, no quantified UOP ?Remained intubated postoperatively ?  ? ?Objective:   ?BP (!) 105/59   Pulse 96   Temp 99.2 ?F (37.3 ?C) (Oral)   Resp (!) 22   Ht _0  (1.778 m)   Wt 118.2 kg   SpO2 96%   BMI 37.39 kg/m?  ? ?Intake/Output Summary (Last 24 hours) at 03/17/2022 0825 ?Last data filed at 03/17/2022 0800 ?Gross per 24 hour  ?Intake 2944.79 ml  ?Output -465 ml  ?Net 3409.79 ml  ? ? ?Weight change: -26.3 kg ? ?Physical Exam: ?GEN: intubated, sedated ?HEENT eyes closed ?PULM coarse bs b/l ?CV Normal rate, no audible rub ?ABD distended, soft, bowel sounds present ?GU: swollen scrotum w/ dry gangrene ?EXT right BKA, left TMA with wound vac ?ACCESS: RIJ TC ? ?Imaging: ?No results found. ? ?Labs: ?BMET ?Recent Labs  ?Lab 03/11/22 ?0559 03/12/22 ?0134 03/13/22 ?0160 03/14/22 ?0148 03/15/22 ?1093 03/15/22 ?2355 03/16/22 ?0602 03/16/22 ?1343 03/16/22 ?1703 03/16/22 ?2004  ?NA 137 136 134* 132*  133* 132* 132* 133* 134* 134* 131*  ?K 4.3 4.3 4.2 4.5  4.5 4.3 4.4 5.2* 5.1 4.7 4.2  ?CL  98 97* 97* 96*  96* 95* 97* 97* 99 100  --   ?CO2 22 20* 21* 18*  18* 22  --  18* 20*  --   --   ?GLUCOSE 123* 131* 130* 130*  130* 118* 114* 141* 115* 113*  --   ?BUN 64* 92* 66* 97*  96* 72* 91* 105* 109* 63*  --   ?CREATININE 7.31* 9.44* 7.10* 8.81*  8.71* 6.60* 6.70* 8.31* 8.93* 5.80*  --   ?CALCIUM 8.5* 8.4* 8.5* 8.3*  8.3* 8.5*  --  8.2* 8.2*  --   --   ?PHOS 8.9* 10.6* 8.4* 9.9* 7.0*  --  8.1* 8.1*  --   --   ? ? ?CBC ?Recent Labs  ?Lab 03/13/22 ?0953 03/13/22 ?1549 03/14/22 ?0148 03/15/22 ?7322 03/15/22 ?0254 03/16/22 ?0602 03/16/22 ?1703 03/16/22 ?2004  ?WBC 9.5  --  10.5 9.0  --  12.2*  --   --   ?NEUTROABS  --   --  8.3* 7.1  --  10.1*  --   --   ?HGB 5.9*   < > 7.4* 8.1* 7.8* 7.7* 8.5* 9.9*  ?HCT 19.1*   < > 23.0* 25.2* 23.0* 24.0* 25.0* 29.0*  ?MCV 93.6  --  90.6 91.0  --  90.9  --   --   ?PLT 295  --  293 257  --  228  --   --   ? < > =  values in this interval not displayed.  ? ? ? ?Medications:   ? ? sodium chloride   Intravenous Once  ? allopurinol  300 mg Per Tube Daily  ? vitamin C  1,000 mg Per Tube Daily  ? aspirin  81 mg Per Tube Daily  ? chlorhexidine gluconate (MEDLINE KIT)  15 mL Mouth Rinse BID  ? Chlorhexidine Gluconate Cloth  6 each Topical Q0600  ? darbepoetin (ARANESP) injection - DIALYSIS  200 mcg Intravenous Q Sat-HD  ? docusate  100 mg Per Tube Daily  ? dronabinol  2.5 mg Oral QAC lunch  ? feeding supplement  237 mL Per Tube QID  ? feeding supplement (PROSource TF)  45 mL Per Tube QID  ? folic acid  1 mg Per Tube Daily  ? guaiFENesin  400 mg Per Tube Q8H  ? heparin injection (subcutaneous)  5,000 Units Subcutaneous Q8H  ? insulin aspart  0-15 Units Subcutaneous Q4H  ? leptospermum manuka honey  1 application. Topical Daily  ? LORazepam  1 mg Intravenous Once  ? mouth rinse  15 mL Mouth Rinse 10 times per day  ? melatonin  3 mg Per Tube QHS  ? metoprolol tartrate  37.5 mg Per Tube TID  ? multivitamin  1 tablet Per Tube QHS  ? mupirocin ointment  1 application. Nasal BID  ?  nutrition supplement (JUVEN)  1 packet Per Tube BID BM  ? pantoprazole sodium  40 mg Per Tube Daily  ? QUEtiapine  25 mg Per Tube Daily  ? QUEtiapine  50 mg Per Tube QHS  ? sevelamer carbonate  2,400 mg Per Tube TID WC  ? sodium chloride flush  10-40 mL Intracatheter Q12H  ? zinc sulfate  220 mg Per Tube Daily  ? ? ? ?Rexene Agent ? ?03/17/2022, 8:25 AM   ?

## 2022-03-17 NOTE — Progress Notes (Signed)
PT Cancellation Note ? ?Patient Details ?Name: Jeffrey Ayala ?MRN: [id number] ?DOB: 12-12-1960 ? ? ?Cancelled Treatment:    Reason Eval/Treat Not Completed: Other (comment). Spoke with pt who initially was alert and answered "[name]" when asked where he was. Pt then began drifting off to sleep. Will attempt re-eval tomorrow when hopefully pt can maintain alertness better.  ? ? ?Shary Decamp [facility] ?03/17/2022, 2:49 PM ?Suanne Marker PT ?Acute Rehabilitation Services ?Office [phone] ? ?

## 2022-03-17 NOTE — Procedures (Signed)
Extubation Procedure Note ? ?Patient Details:   ?Name: Jeffrey Ayala ?DOB: 1961-09-18 ?MRN: [id number] ?  ?Airway Documentation:  ?  ?Vent end date: 03/17/22 Vent end time: 9741  ? ?Evaluation ? O2 sats: stable throughout ?Complications: No apparent complications ?Patient did tolerate procedure well. ?Bilateral Breath Sounds: Diminished, Rhonchi ?  ?Yes ?Audible cuff leak was heard prior extubation and no signs of stridor at this time. Pt is currently stable on 3L [place]. Pt is able to speak and state name. RT will continue to monitor. ? ?Felecia Jan ?03/17/2022, 1:58 PM ? ?

## 2022-03-18 ENCOUNTER — Encounter (HOSPITAL_COMMUNITY): Payer: Self-pay | Admitting: Orthopedic Surgery

## 2022-03-18 DIAGNOSIS — R6521 Severe sepsis with septic shock: Secondary | ICD-10-CM | POA: Diagnosis not present

## 2022-03-18 DIAGNOSIS — A419 Sepsis, unspecified organism: Secondary | ICD-10-CM | POA: Diagnosis not present

## 2022-03-18 LAB — CBC
HCT: 21.3 % — ABNORMAL LOW (ref 39.0–52.0)
HCT: 23.8 % — ABNORMAL LOW (ref 39.0–52.0)
Hemoglobin: 6.7 g/dL — CL (ref 13.0–17.0)
Hemoglobin: 7.9 g/dL — ABNORMAL LOW (ref 13.0–17.0)
MCH: 29.4 pg (ref 26.0–34.0)
MCH: 29.9 pg (ref 26.0–34.0)
MCHC: 31.5 g/dL (ref 30.0–36.0)
MCHC: 33.2 g/dL (ref 30.0–36.0)
MCV: 93.4 fL (ref 80.0–100.0)
MCV: 90.2 fL (ref 80.0–100.0)
Platelets: 203 10*3/uL (ref 150–400)
Platelets: 213 10*3/uL (ref 150–400)
RBC: 2.28 MIL/uL — ABNORMAL LOW (ref 4.22–5.81)
RBC: 2.64 MIL/uL — ABNORMAL LOW (ref 4.22–5.81)
RDW: 15.9 % — ABNORMAL HIGH (ref 11.5–15.5)
RDW: 15.5 % (ref 11.5–15.5)
WBC: 7.3 10*3/uL (ref 4.0–10.5)
WBC: 8.3 10*3/uL (ref 4.0–10.5)
nRBC: 0 % (ref 0.0–0.2)
nRBC: 0 % (ref 0.0–0.2)

## 2022-03-18 LAB — SURGICAL PATHOLOGY

## 2022-03-18 LAB — RENAL FUNCTION PANEL
Albumin: 1.8 g/dL — ABNORMAL LOW (ref 3.5–5.0)
Anion gap: 14 (ref 5–15)
BUN: 110 mg/dL — ABNORMAL HIGH (ref 8–23)
CO2: 21 mmol/L — ABNORMAL LOW (ref 22–32)
Calcium: 8.1 mg/dL — ABNORMAL LOW (ref 8.9–10.3)
Chloride: 98 mmol/L (ref 98–111)
Creatinine, Ser: 8.42 mg/dL — ABNORMAL HIGH (ref 0.61–1.24)
GFR, Estimated: 7 mL/min — ABNORMAL LOW (ref >60)
Glucose, Bld: 122 mg/dL — ABNORMAL HIGH (ref 70–99)
Phosphorus: 8.3 mg/dL — ABNORMAL HIGH (ref 2.5–4.6)
Potassium: 4.6 mmol/L (ref 3.5–5.1)
Sodium: 133 mmol/L — ABNORMAL LOW (ref 135–145)

## 2022-03-18 LAB — GLUCOSE, CAPILLARY
Glucose-Capillary: 137 mg/dL — ABNORMAL HIGH (ref 70–99)
Glucose-Capillary: 109 mg/dL — ABNORMAL HIGH (ref 70–99)
Glucose-Capillary: 120 mg/dL — ABNORMAL HIGH (ref 70–99)
Glucose-Capillary: 134 mg/dL — ABNORMAL HIGH (ref 70–99)
Glucose-Capillary: 142 mg/dL — ABNORMAL HIGH (ref 70–99)

## 2022-03-18 LAB — PREPARE RBC (CROSSMATCH)

## 2022-03-18 MED ORDER — SEVELAMER CARBONATE 2.4 G PO PACK
2.4000 g | PACK | Freq: Three times a day (TID) | ORAL | Status: DC
  Administered 2022-03-18 – 2022-03-23 (×13): 2.4 g
  Filled 2022-03-18 (×18): qty 1

## 2022-03-18 MED ORDER — FENTANYL CITRATE (PF) 100 MCG/2ML IJ SOLN: 25.0000 ug | INTRAMUSCULAR | Status: DC | PRN

## 2022-03-18 MED ORDER — DARBEPOETIN ALFA 200 MCG/0.4ML IJ SOSY
200.0000 ug | PREFILLED_SYRINGE | INTRAMUSCULAR | Status: DC
  Administered 2022-03-23 – 2022-03-30 (×2): 200 ug via INTRAVENOUS
  Filled 2022-03-18 (×2): qty 0.4

## 2022-03-18 MED ORDER — FENTANYL CITRATE (PF) 100 MCG/2ML IJ SOLN
25.0000 ug | INTRAMUSCULAR | Status: DC | PRN
  Administered 2022-03-18: 50 ug via INTRAVENOUS
  Administered 2022-03-18: 25 ug via INTRAVENOUS
  Administered 2022-03-18 – 2022-03-19 (×5): 50 ug via INTRAVENOUS
  Filled 2022-03-18 (×7): qty 2

## 2022-03-18 MED ORDER — SODIUM CHLORIDE 0.9% IV SOLUTION: Freq: Once | INTRAVENOUS | Status: DC

## 2022-03-18 MED ORDER — HALOPERIDOL LACTATE 5 MG/ML IJ SOLN
1.0000 mg | Freq: Four times a day (QID) | INTRAMUSCULAR | Status: AC | PRN
  Administered 2022-03-18: 2 mg via INTRAVENOUS
  Administered 2022-03-19: 3 mg via INTRAVENOUS
  Filled 2022-03-18 (×2): qty 1

## 2022-03-18 MED ORDER — NUTRISOURCE FIBER PO PACK
1.0000 | PACK | Freq: Two times a day (BID) | ORAL | Status: DC
  Administered 2022-03-18 – 2022-03-23 (×9): 1
  Filled 2022-03-18 (×12): qty 1

## 2022-03-18 MED ORDER — VANCOMYCIN HCL IN DEXTROSE 1-5 GM/200ML-% IV SOLN
1000.0000 mg | INTRAVENOUS | Status: DC
Start: 2022-03-18 — End: 2022-03-21
  Administered 2022-03-18 – 2022-03-20 (×2): 1000 mg via INTRAVENOUS
  Filled 2022-03-18 (×2): qty 200

## 2022-03-18 MED ORDER — SEVELAMER CARBONATE 2.4 G PO PACK
2.4000 g | PACK | Freq: Three times a day (TID) | ORAL | Status: DC
  Filled 2022-03-18: qty 1

## 2022-03-18 NOTE — Progress Notes (Signed)
Physical Therapy Treatment ?Patient Details ?Name: Jeffrey Ayala ?MRN: [id number] ?DOB: May 25, 1961 ?Today's Date: 03/18/2022 ? ? ?History of Present Illness Pt adm 4/2 c/o N/V/D and found to have septic shock due to scrotal cellulitis vs RLL PNA.  Pt intubated 4/3 and brief cardiac arrest post intubation. [name] started 4/5.  Pt with AKI and started on CRRT on 4/6. Pt extubated 4/10. Due to gangrene of B LE, pt underwent R BKA and L transmet amputations on 4/23. On 4/24, pt underwent I&D of scrotum due to dry gangrene. Pt also with afib with rvr. PMH - Bil TKR, migraines ? ?  ?PT Comments  ? ? PT downgrades goals and extends plan of care after R BKA, L transmetatarsal amputation, scrotal debridement and intubation. Pt demonstrates slowed processing with waxing and waning alertness during session. Pt with poor initiation of mobility and requires significant physical assistance to perform all functional mobility at this time. Pt will benefit from continued acute PT services in an effort to reduce caregiver burden and falls risk. If pt arousal, initiation, and activity tolerance do not improve then SNF may need to be considered.   ?Recommendations for follow up therapy are one component of a multi-disciplinary discharge planning process, led by the attending physician.  Recommendations may be updated based on patient status, additional functional criteria and insurance authorization. ? ?Follow Up Recommendations ? Acute inpatient rehab (3hours/day) (may need to pursue SNF if activity tolerance and cognition do not significantly improve) ?  ?  ?Assistance Recommended at Discharge Frequent or constant Supervision/Assistance  ?Patient can return home with the following Two people to help with walking and/or transfers;Two people to help with bathing/dressing/bathroom;Assistance with cooking/housework;Assistance with feeding;Direct supervision/assist for medications management;Direct supervision/assist for financial  management;Assist for transportation;Help with stairs or ramp for entrance ?  ?Equipment Recommendations ? Wheelchair (measurements PT);Wheelchair cushion (measurements PT);Hospital bed;Other (comment) (hoyer lift.)  ?  ?Recommendations for Other Services   ? ? ?  ?Precautions / Restrictions Precautions ?Precautions: Fall;Other (comment) ?Precaution Comments: cortrak, B LE wound vacs ?Restrictions ?Weight Bearing Restrictions: Yes ?RLE Weight Bearing: Non weight bearing ?LLE Weight Bearing: Non weight bearing  ?  ? ?Mobility ? Bed Mobility ?Overal bed mobility: Needs Assistance ?Bed Mobility: Supine to Sit, Sit to Supine ?  ?  ?Supine to sit: Max assist, +2 for safety/equipment, HOB elevated ?Sit to supine: Total assist, +2 for physical assistance ?  ?  ?  ? ?Transfers ?  ?  ?  ?  ?  ?  ?  ?  ?  ?  ?  ? ?Ambulation/Gait ?  ?  ?  ?  ?  ?  ?  ?  ? ? ?Stairs ?  ?  ?  ?  ?  ? ? ?Wheelchair Mobility ?  ? ?Modified Rankin (Stroke Patients Only) ?  ? ? ?  ?Balance Overall balance assessment: Needs assistance ?Sitting-balance support: Single extremity supported, Bilateral upper extremity supported, Feet unsupported ?Sitting balance-Leahy Scale: Poor ?Sitting balance - Comments: initially modA, progresses to minG ?Postural control: Right lateral lean ?  ?  ?  ?  ?  ?  ?  ?  ?  ?  ?  ?  ?  ?  ?  ? ?  ?Cognition Arousal/Alertness: Awake/alert (awakens to stimuli but appears to intermittently doze off) ?Behavior During Therapy: Flat affect ?Overall Cognitive Status: Impaired/Different from baseline ?Area of Impairment: Attention, Memory, Following commands, Safety/judgement, Problem solving, Awareness, Orientation ?  ?  ?  ?  ?  ?  ?  ?  ?  Orientation Level: Place, Time, Situation ?Current Attention Level: Focused ?Memory: Decreased recall of precautions, Decreased short-term memory ?Following Commands: Follows one step commands inconsistently (poor initiation) ?Safety/Judgement: Decreased awareness of safety, Decreased  awareness of deficits ?Awareness: Intellectual ?Problem Solving: Slow processing, Decreased initiation, Difficulty sequencing, Requires verbal cues, Requires tactile cues ?  ?  ?  ? ?  ?Exercises   ? ?  ?General Comments General comments (skin integrity, edema, etc.): VSS on 2L [place], systolic BP 122 sitting, pt reports dizziness but level of arousal unchanged, RN aware ?  ?  ? ?Pertinent Vitals/Pain Pain Assessment ?Pain Assessment: Faces ?Faces Pain Scale: Hurts a little bit ?Pain Location: L foot with bed mobility ?Pain Descriptors / Indicators: Grimacing ?Pain Intervention(s): Monitored during session  ? ? ?Home Living   ?  ?  ?  ?  ?  ?  ?  ?  ?  ?   ?  ?Prior Function    ?  ?  ?   ? ?PT Goals (current goals can now be found in the care plan section) Acute Rehab PT Goals ?Patient Stated Goal: not stated ?PT Goal Formulation: Patient unable to participate in goal setting ?Time For Goal Achievement: 04/01/22 ?Potential to Achieve Goals: Fair ?Progress towards PT goals: Goals downgraded-see care plan ? ?  ?Frequency ? ? ? Min 3X/week ? ? ? ?  ?PT Plan Current plan remains appropriate  ? ? ?Co-evaluation PT/OT/SLP Co-Evaluation/Treatment: Yes ?Reason for Co-Treatment: Complexity of the patient's impairments (multi-system involvement);Necessary to address cognition/behavior during functional activity;For patient/therapist safety;To address functional/ADL transfers ?PT goals addressed during session: Mobility/safety with mobility;Balance;Strengthening/ROM ?OT goals addressed during session: ADL's and self-care;Strengthening/ROM ?  ? ?  ?AM-PAC PT "6 Clicks" Mobility   ?Outcome Measure ? Help needed turning from your back to your side while in a flat bed without using bedrails?: Total ?Help needed moving from lying on your back to sitting on the side of a flat bed without using bedrails?: Total ?Help needed moving to and from a bed to a chair (including a wheelchair)?: Total ?Help needed standing up from a chair using  your arms (e.g., wheelchair or bedside chair)?: Total ?Help needed to walk in hospital room?: Total ?Help needed climbing 3-5 steps with a railing? : Total ?6 Click Score: 6 ? ?  ?End of Session Equipment Utilized During Treatment: Oxygen ?Activity Tolerance: Patient limited by fatigue;Patient limited by lethargy ?Patient left: in bed;with call bell/phone within reach;with bed alarm set ?Nurse Communication: Mobility status;Need for lift equipment ?PT Visit Diagnosis: Other abnormalities of gait and mobility (R26.89);Muscle weakness (generalized) (M62.81) ?  ? ? ?Time: 4497-5300 ?PT Time Calculation (min) (ACUTE ONLY): 24 min ? ?Charges:             ?          ? ?Zenaida Niece, PT, DPT ?Acute Rehabilitation ?Pager: [phone] ?Office [phone] ? ? ? ?Zenaida Niece ?03/18/2022, 1:58 PM ? ?

## 2022-03-18 NOTE — TOC Progression Note (Signed)
Transition of Care (TOC) - Progression Note  ? ? ?Patient Details  ?Name: Jeffrey Ayala ?MRN: [id number] ?Date of Birth: 12-19-1960 ? ?Transition of Care (TOC) CM/SW Contact  ?Tom-Johnson, Renea Ee, RN ?Phone Number: ?03/18/2022, 4:27 PM ? ?Clinical Narrative:    ? ?Patient extubated 04/25. Currently on 3L [place]. Patient underwent left transmetatarsal amputation and right BKA on 04/23 and scrotal debridement on 04/24. AIR recommended and following. CM will continue to follow with needs. ? ?Expected Discharge Plan: [place] ?Barriers to Discharge: Continued Medical Work up ? ?Expected Discharge Plan and Services ?Expected Discharge Plan: [place] ?  ?Discharge Planning Services: CM Consult ?  ?Living arrangements for the past 2 months: [place] ?                ?  ?  ?  ?  ?  ?  ?  ?  ?  ?  ? ? ?Social Determinants of Health (SDOH) Interventions ?  ? ?Readmission Risk Interventions ?   ? View : No data to display.  ?  ?  ?  ? ? ?

## 2022-03-18 NOTE — Progress Notes (Signed)
eLink Physician-Brief Progress Note ?Patient Name: Jeffrey Ayala ?DOB: 08-Dec-1960 ?MRN: [id number] ? ? ?Date of Service ? 03/18/2022  ?HPI/Events of Note ? Patient with agitated delirium, pulled off his condom catheter and pulling at his NG tube. Qtc 0.44. HR 130's.  ?eICU Interventions ? Bilateral soft wrist restraints , and PRN iv Haldol ordered. He's about to receive a dose of oral Lopressor.  ? ? ? ?  ? ?Jeffrey Ayala ?03/18/2022, 9:53 PM ?

## 2022-03-18 NOTE — Progress Notes (Signed)
OT Cancellation Note ? ?Patient Details ?Name: Jeffrey Ayala ?MRN: [id number] ?DOB: 25-Dec-1960 ? ? ?Cancelled Treatment:    Reason Eval/Treat Not Completed: Patient at procedure or test/ unavailable Receiving HD this AM. Will follow-up in PM for OT re-evaluation as schedule permits. ? ?[name] ?03/18/2022, 10:46 AM ?

## 2022-03-18 NOTE — Progress Notes (Signed)
eLink Physician-Brief Progress Note ?Patient Name: Jeffrey Ayala ?DOB: 1961/11/23 ?MRN: [id number] ? ? ?Date of Service ? 03/18/2022  ?HPI/Events of Note ? Hemoglobin 6.7 gm / dl, no evidence of overt bleeding.  ?eICU Interventions ? One unit PRBC ordered transfused.  ? ? ? ?  ? ?[name] ?03/18/2022, 7:06 AM ?

## 2022-03-18 NOTE — Progress Notes (Addendum)
? ?  Chart reviewed-  including new events and recent surgeries. Per CCM afib is rate-controlled- not on anticoagulation d/t hemoglobin drop and low CHADVASC score of 1 - agree with this. No further suggestions at this time.   ? ?[facility] will sign off.   ?Medication Recommendations:  as above ?Other recommendations (labs, testing, etc):  none ?Follow up as an outpatient:  Dr. Marlou Porch or APP ? ?Pixie Casino, MD, [facility], FACP  ?[place]  ?Medical Director of the [facility] &  ?Cardi[facility] ?Diplomate of the [employer] of Clinical Lipidology ?Attending Cardiologist  ?Direct Dial: [phone]  Fax: [phone]  ?Website:  [email or web address] ? ? ?

## 2022-03-18 NOTE — Progress Notes (Signed)
PT Cancellation Note ? ?Patient Details ?Name: Jeffrey Ayala ?MRN: [id number] ?DOB: October 17, 1961 ? ? ?Cancelled Treatment:    Reason Eval/Treat Not Completed: Patient at procedure or test/unavailable. Pt receiving HD. ? ? ?Shary Decamp [facility] ?03/18/2022, 9:25 AM ?Suanne Marker PT ?Acute Rehabilitation Services ?Office [phone] ? ?

## 2022-03-18 NOTE — Progress Notes (Signed)
Occupational Therapy Treatment/Re-Evaluation ?Patient Details ?Name: NEHAL SHIVES ?MRN: [id number] ?DOB: 12-04-1960 ?Today's Date: 03/18/2022 ? ? ?History of present illness Pt adm 4/2 c/o N/V/D and found to have septic shock due to scrotal cellulitis vs RLL PNA.  Pt intubated 4/3 and brief cardiac arrest post intubation. [name] started 4/5.  Pt with AKI and started on CRRT on 4/6. Pt extubated 4/10. Due to gangrene of B LE, pt underwent R BKA and L transmet amputations on 4/23. On 4/24, pt underwent I&D of scrotum due to dry gangrene. Pt also with afib with rvr. PMH - Bil TKR, migraines ?  ?OT comments ? Pt seen for first OT session since amputations and scrotal I&D. Pt remains impaired deficits in strength, endurance and sitting balance. Pt also significantly impacted by cognitive impairment with deficits in attention, initiation, problem solving and command following with delayed response time. Pt continues to require extensive +2 physical assist for bed mobility and Total A for all ADLs today due to cognition. Pt does demo ability to maintain sitting balance EOB though with NWB BLE, transfer options further limited and will currently require maximove for OOB transfers. With amputations and current assist levels, anticipate pt may require a longer recovery time and prolonged rehab. If cognition and activity tolerance does not demo improvements in next sessions, will need to consider SNF rehab at [place].  ? ?Recommendations for follow up therapy are one component of a multi-disciplinary discharge planning process, led by the attending physician.  Recommendations may be updated based on patient status, additional functional criteria and insurance authorization. ?   ?Follow Up Recommendations ? Acute inpatient rehab (3hours/day) (pending activity tolerance improvements)  ?  ?Assistance Recommended at Discharge Frequent or constant Supervision/Assistance  ?Patient can return home with the following ? Two people  to help with walking and/or transfers;Two people to help with bathing/dressing/bathroom;Assistance with cooking/housework;Assistance with feeding;Direct supervision/assist for medications management;Direct supervision/assist for financial management;Assist for transportation;Help with stairs or ramp for entrance ?  ?Equipment Recommendations ? Wheelchair (measurements OT);Wheelchair cushion (measurements OT);Hospital bed  ?  ?Recommendations for Other Services Rehab consult ? ?  ?Precautions / Restrictions Precautions ?Precautions: Fall;Other (comment) ?Precaution Comments: cortrak, B LE wound vacs ?Restrictions ?Weight Bearing Restrictions: Yes ?RLE Weight Bearing: Non weight bearing ?LLE Weight Bearing: Non weight bearing  ? ? ?  ? ?Mobility Bed Mobility ?Overal bed mobility: Needs Assistance ?Bed Mobility: Supine to Sit, Sit to Supine ?  ?  ?Supine to sit: Max assist, +2 for physical assistance, +2 for safety/equipment, HOB elevated ?Sit to supine: Total assist, +2 for physical assistance, +2 for safety/equipment, HOB elevated ?  ?General bed mobility comments: with max sequencing cues, pt able to follow 25% with initiation to reach towards bedrail though required significant LE/trunk assistance. Total A x 2 to get back to bed ?  ? ?Transfers ?  ?  ?  ?  ?  ?  ?  ?  ?  ?  ?  ?  ?Balance Overall balance assessment: Needs assistance ?Sitting-balance support: Bilateral upper extremity supported, Feet supported ?Sitting balance-Leahy Scale: Poor ?Sitting balance - Comments: initially requiring assist to maintain balance with posterior lean but progressed to close min guard approx 6 min ?Postural control: Posterior lean ?  ?  ?  ?  ?  ?  ?  ?  ?  ?  ?  ?  ?  ?  ?   ? ?ADL either performed or assessed with clinical judgement  ? ?ADL Overall  ADL's : Needs assistance/impaired ?  ?  ?Grooming: Total assistance;Wash/dry face;Bed level ?Grooming Details (indicate cue type and reason): unable to initiate task despite  multimodal cues, bringing hand towards face; also continued difficulty with R elbow flexion ?  ?  ?  ?  ?  ?  ?  ?  ?  ?  ?  ?  ?  ?  ?  ?General ADL Comments: Overall Total A for ADLs due to cognition, poor initiation and poor command command following ?  ? ?Extremity/Trunk Assessment Upper Extremity Assessment ?Upper Extremity Assessment: RUE deficits/detail ?RUE Deficits / Details: cyst on elbow. wife reports this is baseline. however, pt with noted grimacing with elbow flexion and unable to reach to face. Pt's wife also reports likely need for shoulder replacement though shoulder AROM WFL ?  ?Lower Extremity Assessment ?Lower Extremity Assessment: Defer to PT evaluation ?  ?  ?  ? ?Vision   ?Vision Assessment?: No apparent visual deficits ?  ?Perception   ?  ?Praxis   ?  ? ?Cognition Arousal/Alertness: Awake/alert, Lethargic ?Behavior During Therapy: Flat affect ?Overall Cognitive Status: Impaired/Different from baseline ?Area of Impairment: Attention, Memory, Following commands, Awareness, Problem solving, Orientation, Safety/judgement ?  ?  ?  ?  ?  ?  ?  ?  ?Orientation Level: Disoriented to, Place, Time, Situation ?Current Attention Level: Focused ?Memory: Decreased short-term memory, Decreased recall of precautions ?Following Commands: Follows one step commands with increased time, Follows one step commands inconsistently ?Safety/Judgement: Decreased awareness of safety, Decreased awareness of deficits ?Awareness: Intellectual ?Problem Solving: Slow processing, Decreased initiation, Difficulty sequencing, Requires verbal cues, Requires tactile cues ?General Comments: varied from alert to falling asleep intermittently but easily awakened. Very slow processing time, inconsistent command following and poor initiation of tasks ?  ?  ?   ?Exercises   ? ?  ?Shoulder Instructions   ? ? ?  ?General Comments VSS on 2 L O2. BP soft though not orthostatic readings, reports dizziness EOB  ? ? ?Pertinent Vitals/ Pain        Pain Assessment ?Pain Assessment: Faces ?Faces Pain Scale: Hurts a little bit ?Pain Location: L foot with bed mobility; unable to specify ?Pain Descriptors / Indicators: Grimacing ?Pain Intervention(s): Monitored during session ? ?Home Living   ?  ?  ?  ?  ?  ?  ?  ?  ?  ?  ?  ?  ?  ?  ?  ?  ?  ?  ? ?  ?Prior Functioning/Environment    ?  ?  ?  ?   ? ?Frequency ? Min 2X/week  ? ? ? ? ?  ?Progress Toward Goals ? ?OT Goals(current goals can now be found in the care plan section) ? Progress towards OT goals: Progressing toward goals ? ?Acute Rehab OT Goals ?Patient Stated Goal: none stated ?OT Goal Formulation: With patient ?Time For Goal Achievement: 03/17/22 ?Potential to Achieve Goals: Good ?ADL Goals ?Pt Will Perform Grooming: with min assist;sitting;bed level ?Pt Will Perform Upper Body Bathing: with min assist;sitting;bed level ?Pt Will Transfer to Toilet: with max assist;with +2 assist;stand pivot transfer;squat pivot transfer;bedside commode ?Pt/caregiver will Perform Home Exercise Program: Increased strength;Both right and left upper extremity;With written HEP provided ?Additional ADL Goal #1: Pt will attend to and complete ADL task with no more than minimal cueing to complete.  ?Plan Discharge plan remains appropriate;Frequency remains appropriate   ? ?Co-evaluation ? ? ? PT/OT/SLP Co-Evaluation/Treatment: Yes ?Reason for Co-Treatment: Complexity of  the patient's impairments (multi-system involvement);Necessary to address cognition/behavior during functional activity;For patient/therapist safety;To address functional/ADL transfers ?PT goals addressed during session: Mobility/safety with mobility;Balance;Strengthening/ROM ?OT goals addressed during session: ADL's and self-care;Strengthening/ROM ?  ? ?  ?AM-PAC OT "6 Clicks" Daily Activity     ?Outcome Measure ? ? Help from another person eating meals?: Total (NPO) ?Help from another person taking care of personal grooming?: Total ?Help from another person  toileting, which includes using toliet, bedpan, or urinal?: Total ?Help from another person bathing (including washing, rinsing, drying)?: Total ?Help from another person to put on and taking off regular upp

## 2022-03-18 NOTE — Progress Notes (Addendum)
Nutrition Follow-up ? ?DOCUMENTATION CODES:  ? ?Non-severe (moderate) malnutrition in context of acute illness/injury ? ?INTERVENTION:  ? ?Continue tube feeds via Cortrak tube: ?- Nepro @ 50 ml/hr (1200 ml/day) ?- ProSource TF 45 ml QID ? ?Tube feeding regimen provides 2320 kcal, 141 grams of protein, and 872 ml of H2O. ? ?- Continue renal MVI daily per tube ? ?- Continue 1 packet Juven BID per tube, each packet provides 95 calories, 2.5 grams of protein, and 9.8 grams of carbohydrate; also contains 7 grams of L-arginine and L-glutamine, 300 mg vitamin C, 15 mg vitamin E, 1.2 mcg vitamin B-12, 9.5 mg zinc, 200 mg calcium, and 1.5 g  Calcium Beta-hydroxy-Beta-methylbutyrate to support wound healing ? ?- RD will monitor for diet advancement and add oral nutrition supplements as appropriate ? ?NUTRITION DIAGNOSIS:  ? ?Moderate Malnutrition related to acute illness (septic shock, A fib, AKI) as evidenced by mild fat depletion, mild muscle depletion, moderate muscle depletion. ? ?Ongoing, being addressed via TF ? ?GOAL:  ? ?Patient will meet greater than or equal to 90% of their needs ? ?Met via TF ? ?MONITOR:  ? ?PO intake, Supplement acceptance, Labs, Skin ? ?REASON FOR ASSESSMENT:  ? ?Consult ?Calorie Count ? ?ASSESSMENT:  ? ?61 year old male who presented to the ED on 4/02 with N/V/D. PMH of HTN, PUD, eosinophilic esophagitis, kidney stones. Pt admitted with septic shock, new onset atrial fibrillation, AKI. ? ?04/03 - intubated, brief cardiac arrest ?04/06 - CRRT initiated ?04/10 - extubated, diet advanced to full liquids ?04/11 - diet advanced to regular ?04/17 - Cortrak placed, tube feeds started, s/p tunneled HD catheter ?04/23 - NPO at midnight, s/p L transmetatarsal amputation and R BKA ?04/24 - s/p debridement of dry gangrene of the anterior scrotum, remained intubated post-op ?04/25 - extubated, TF resumed ? ?Discussed pt with RN and during ICU rounds. Pt awaiting SLP evaluation for potential diet advancement.  Spoke with pt's wife at bedside. Pt altered at time of visit and did not communicate meaningfully with RD. Pt's wife concerned about diarrhea. Discussed that nutrisource fiber had been added to help bulk stool. ? ?Last iHD was today with 2000 ml net UF. ? ?Admit weight: 104.3 kg ?Current weight: 118.2 kg ? ?Pt continues to have non-pitting edema to BUE and BLE. ? ?Current TF: Nepro @ 50 ml/hr, ProSource TF 45 ml QID ? ?Medications reviewed and include: vitamin C 1000 mg daily, aranesp weekly, nutrisource fiber BID, folic acid, SSI q 4 hours, melatonin, rena-vit, Juven BID, protonix, renvela TID, zinc sulfate 220 mg daily, IV abx ? ?Labs reviewed: sodium 133, BUN 110, creatinine 8.42, phosphorus 8.3, hemoglobin 6.7 ?CBG's: 109-137 x 24 hours ? ?I/O's: +5.2 L since admit ? ?Diet Order:   ?Diet Order   ? ? None  ? ?  ? ? ?EDUCATION NEEDS:  ? ?No education needs have been identified at this time ? ?Skin:  Skin Assessment: ?Skin Integrity Issues: ?Stage II: coccyx ?Wound Vac: R leg, L leg ?Incisions: scrotum ?Other: skin tear L thigh and R elbow ? ?Last BM:  03/18/22 multiple type 7 ? ?Height:  ? ?Ht Readings from Last 1 Encounters:  ?03/15/22 '5\' 10"'  (1.778 m)  ? ? ?Weight:  ? ?Wt Readings from Last 1 Encounters:  ?02/12/20 110.2 kg  ? ? ?Ideal Body Weight:  75.5 kg ? ?BMI:  Body mass index is 37.39 kg/m?. ? ?Estimated Nutritional Needs:  ? ?Kcal:  2300-2500 ? ?Protein:  150-170 grams ? ?Fluid:  >/= 2.0 L ? ? ? ?  Gustavus Bryant, MS, RD, LDN ?Inpatient Clinical Dietitian ?Please see AMiON for contact information. ? ?

## 2022-03-18 NOTE — Progress Notes (Addendum)
? ? ? ?NAME:  Jeffrey Ayala, MRN:  [id number], DOB:  11-Dec-1960, LOS: 24 ?ADMISSION DATE:  02/22/2022, CONSULTATION DATE:  4/2 ?REFERRING MD:  Doren Custard- ED CHIEF COMPLAINT: Septic shock ? ?History of Present Illness:  ?Jeffrey Ayala, is a 61 y.o. male, who presented to the [facility] ED with a chief complaint of nausea, vomiting, diarrhea. ? ?They have a pertinent past medical history of gastric ulcer, kidney stones, eosinophilic esophagitis, migraines  ? ?History obtained from [name] (spouse), on 4/1 patient began complaining of feeling unwell. Wife denies sick contacts, questionable food.  Wife denies taking any meds currently.  Around 2 PM on 4/2 the patient developed nausea, vomiting, and diarrhea while at a birthday party and soiled himself. EMS was called. When EMS arrived he was found to be in A-fib with a rate of 120-160. ? ?Pt was admitted to the ICU for septic shock of unclear source and required intubation and multiple vasopressors.  Had brief cardiac arrest and worsening renal function eventually requiring CRRT.  He was eventually extubated and transferred to the floor, unfortunately developed bilateral dry gangrene of both feet secondary to high dose vasopressors and required bilateral transmetarsal amputation.   He spiked a fever 4/18 and was resumed on broad spectrum abx.  Early in hospital course noted to have possible scrotal cellulitis, but not suspicious for deep infection.   He had an area on the anterior testicle that became ecchymotic and with dry eschar, so was taken to the OR on 4/24 for debridement.  The underlying tissue was viable.  He required low dose pressors and was left intubated secondary to agitation prior to the procedure, so PCCM was re-consulted.  ? ?Pertinent  Medical History  ?gastric ulcer, kidney stones, eosinophilic esophagitis, migraines  ? ?Significant Hospital Events: ?Including procedures, antibiotic start and stop dates in addition to other pertinent events   ?4/2  Presented to the ED. CT chest negative for acute process.  CT abdomen pelvis with and without contrast notable for swollen scrotum concerning for infection, colon filled with diarrhea.  Urology and cardiology were consulted by the ED. Vanc, cefepime started.  ?4/3 Intubated overnight, brief cardiac arrest. Escalating pressors. ?4/5 started on methylene blue ?4/6 started on CRRT ?4/8 appears more responsive ?4/9 responsive ?4/10 Extubated, PT rec IPR ?4/11 Vascular consulted for foot wounds, bival stopped ?4/12 1st IHD ?4/14 transferred to [facility] ?4/23 underwent left transmetatarsal amputation and right BKA by [name] ? 4/24 scrotal debridement, on low dose Neo and left intubated, transferred back to ICU ? ? ?Interim History / Subjective:  ?H/H a bit low this AM. ?Having waxing-waning mental status through night. ?Wife at bedside. ?Lots of loose stools. ? ?Objective   ?Blood pressure (!) 115/59, pulse 96, temperature 97.9 ?F (36.6 ?C), temperature source Axillary, resp. rate 16, height '5\' 10"'$  (1.778 m), weight 118.2 kg, SpO2 100 %.  ?   ?Vent Mode: PSV;CPAP ?FiO2 (%):  [40 %] 40 % ?Set Rate:  [22 bmp] 22 bmp ?Vt Set:  [580 mL] 580 mL ?PEEP:  [5 cmH20] 5 cmH20 ?Pressure Support:  [8 cmH20] 8 cmH20  ? ?Intake/Output Summary (Last 24 hours) at 03/18/2022 0755 ?Last data filed at 03/18/2022 0300 ?Gross per 24 hour  ?Intake 1305.88 ml  ?Output --  ?Net 1305.88 ml  ? ? ?Filed Weights  ? 03/16/22 1322 03/16/22 1604 03/17/22 0500  ?Weight: 115.2 kg 115.8 kg 118.2 kg  ? ? ?No distress ?Answers questions appropriately ?Heart sounds regular ?Ext warm ?  Lungs diminished bases, no accessory muscle use ?Mild anasarca ?Scrotal dressing in place without strike-through ?Bilateral amputation vacs in place ? ?H/H down 1 point ?Plts okay ? ?Assessment & Plan:  ? ?Scrotal cellulitis- S/p debridement, had viable tissue underneath  ?Hypotension- resolved ?Expected post-op vent management- off vent ?Acute Encephalopathy ?- continue abx, f/u  intra-op scrotal tissue cultures ?- encourage day-night cycles, limit opiates as able ? ?Post op pain control-  ?- oxycodone standing with PRN fentanyl for breakthrough ? ?Renal failure, initially acute from sepsis, now dialysis dependent ?-has tunneled catheter, continue iHD, nephrology following ? ?Persistent atrial fibrillation with RVR ?New on admission, likely secondary to sepsis.  ?-cardiology following, rate controlled with metoprolol ?-continue Asa ?-was on heparin gtt, stopped 2/2 hgb drop ?-CHADS2vasc score 1 does not warrant AC at present, can do heparin ppx dose for now.  Ongoing intermittent transfusion needs. ? ?S/P L TMA and R TTA- wound vac management and further plans per Dr. Jess Barters team ? ?Loose stool- hold bowel regimen, add fiber, appreciate PharmD help ? ?Best Practice (right click and "Reselect all SmartList Selections" daily)  ? ?Diet/type: resume TF ?DVT prophylaxis: heparin ?GI prophylaxis: PPI ?Lines: N/A ?Foley:  N/A ?Code Status:  full code ?Last date of multidisciplinary goals of care discussion: wife updated 4/26, full scope tx   ? ?If BP tolerates HD, can move to progressive and TRH can take back over 4/27 AM ? ?Erskine Emery MD PCCM ? ?

## 2022-03-18 NOTE — Procedures (Signed)
I was present at this dialysis session. I have reviewed the session itself and made appropriate changes.  ? ?2K bath UF goal 1.5L. BPs stable. Extubated yesterday to [place].  Wife at bedside, he's been more coherent this AM.   ? ?Will try to give 1u pRBC on HD. RS ? ?Filed Weights  ? 03/16/22 1322 03/16/22 1604 03/17/22 0500  ?Weight: 115.2 kg 115.8 kg 118.2 kg  ? ? ?Recent Labs  ?Lab 03/18/22 ?0602  ?NA 133*  ?K 4.6  ?CL 98  ?CO2 21*  ?GLUCOSE 122*  ?BUN 110*  ?CREATININE 8.42*  ?CALCIUM 8.1*  ?PHOS 8.3*  ? ? ?Recent Labs  ?Lab 03/14/22 ?0148 03/15/22 ?9163 03/15/22 ?8466 03/16/22 ?0602 03/16/22 ?1703 03/16/22 ?2004 03/18/22 ?0602  ?WBC 10.5 9.0  --  12.2*  --   --  7.3  ?NEUTROABS 8.3* 7.1  --  10.1*  --   --   --   ?HGB 7.4* 8.1*   < > 7.7* 8.5* 9.9* 6.7*  ?HCT 23.0* 25.2*   < > 24.0* 25.0* 29.0* 21.3*  ?MCV 90.6 91.0  --  90.9  --   --  93.4  ?PLT 293 257  --  228  --   --  203  ? < > = values in this interval not displayed.  ? ? ?Scheduled Meds: ? sodium chloride   Intravenous Once  ? allopurinol  100 mg Per Tube Daily  ? vitamin C  1,000 mg Per Tube Daily  ? aspirin  81 mg Per Tube Daily  ? Chlorhexidine Gluconate Cloth  6 each Topical Q0600  ? darbepoetin (ARANESP) injection - DIALYSIS  200 mcg Intravenous Q Sat-HD  ? feeding supplement (PROSource TF)  45 mL Per Tube QID  ? fiber  1 packet Per Tube BID  ? folic acid  1 mg Per Tube Daily  ? guaiFENesin  400 mg Per Tube Q8H  ? heparin injection (subcutaneous)  5,000 Units Subcutaneous Q8H  ? insulin aspart  0-15 Units Subcutaneous Q4H  ? leptospermum manuka honey  1 application. Topical Daily  ? melatonin  3 mg Per Tube QHS  ? metoprolol tartrate  37.5 mg Per Tube TID  ? multivitamin  1 tablet Per Tube QHS  ? mupirocin ointment  1 application. Nasal BID  ? nutrition supplement (JUVEN)  1 packet Per Tube BID BM  ? oxyCODONE  5 mg Per Tube Q4H  ? pantoprazole sodium  40 mg Per Tube Daily  ? QUEtiapine  25 mg Per Tube Daily  ? QUEtiapine  50 mg Per Tube QHS  ?  sevelamer carbonate  2,400 mg Per Tube TID WC  ? sodium chloride flush  10-40 mL Intracatheter Q12H  ? sodium chloride flush  3 mL Intravenous Q12H  ? zinc sulfate  220 mg Per Tube Daily  ? ?Continuous Infusions: ? sodium chloride Stopped (03/18/22 0601)  ? ceFEPime (MAXIPIME) IV Stopped (03/17/22 2230)  ? feeding supplement (NEPRO CARB STEADY) 1,000 mL (03/17/22 1400)  ? phenylephrine (NEO-SYNEPHRINE) Adult infusion 20 mcg/min (03/16/22 2045)  ? ?PRN Meds:.acetaminophen, alum & mag hydroxide-simeth, fentaNYL (SUBLIMAZE) injection, guaiFENesin-dextromethorphan, heparin sodium (porcine), hydrALAZINE, labetalol, metoprolol tartrate, ondansetron (ZOFRAN) IV, phenol, sodium chloride flush, white petrolatum   ?Pearson Grippe  MD ?03/18/2022, 8:26 AM ?  ?

## 2022-03-19 DIAGNOSIS — Z452 Encounter for adjustment and management of vascular access device: Secondary | ICD-10-CM

## 2022-03-19 DIAGNOSIS — I4891 Unspecified atrial fibrillation: Secondary | ICD-10-CM | POA: Diagnosis not present

## 2022-03-19 DIAGNOSIS — A419 Sepsis, unspecified organism: Secondary | ICD-10-CM | POA: Diagnosis not present

## 2022-03-19 DIAGNOSIS — R4182 Altered mental status, unspecified: Secondary | ICD-10-CM | POA: Diagnosis not present

## 2022-03-19 LAB — GLUCOSE, CAPILLARY
Glucose-Capillary: 139 mg/dL — ABNORMAL HIGH (ref 70–99)
Glucose-Capillary: 144 mg/dL — ABNORMAL HIGH (ref 70–99)
Glucose-Capillary: 130 mg/dL — ABNORMAL HIGH (ref 70–99)
Glucose-Capillary: 137 mg/dL — ABNORMAL HIGH (ref 70–99)
Glucose-Capillary: 125 mg/dL — ABNORMAL HIGH (ref 70–99)
Glucose-Capillary: 88 mg/dL (ref 70–99)

## 2022-03-19 LAB — TYPE AND SCREEN
ABO/RH(D): A POS
Antibody Screen: NEGATIVE
Unit division: 0

## 2022-03-19 LAB — CBC
HCT: 25.1 % — ABNORMAL LOW (ref 39.0–52.0)
Hemoglobin: 8 g/dL — ABNORMAL LOW (ref 13.0–17.0)
MCH: 29.1 pg (ref 26.0–34.0)
MCHC: 31.9 g/dL (ref 30.0–36.0)
MCV: 91.3 fL (ref 80.0–100.0)
Platelets: 235 10*3/uL (ref 150–400)
RBC: 2.75 MIL/uL — ABNORMAL LOW (ref 4.22–5.81)
RDW: 15.6 % — ABNORMAL HIGH (ref 11.5–15.5)
WBC: 9.5 10*3/uL (ref 4.0–10.5)
nRBC: 0 % (ref 0.0–0.2)

## 2022-03-19 LAB — BPAM RBC
Blood Product Expiration Date: 202305112359
ISSUE DATE / TIME: 202304260935
Unit Type and Rh: 6200

## 2022-03-19 LAB — RENAL FUNCTION PANEL
Albumin: 2 g/dL — ABNORMAL LOW (ref 3.5–5.0)
Anion gap: 14 (ref 5–15)
BUN: 72 mg/dL — ABNORMAL HIGH (ref 8–23)
CO2: 22 mmol/L (ref 22–32)
Calcium: 8.2 mg/dL — ABNORMAL LOW (ref 8.9–10.3)
Chloride: 95 mmol/L — ABNORMAL LOW (ref 98–111)
Creatinine, Ser: 5.35 mg/dL — ABNORMAL HIGH (ref 0.61–1.24)
GFR, Estimated: 11 mL/min — ABNORMAL LOW (ref >60)
Glucose, Bld: 141 mg/dL — ABNORMAL HIGH (ref 70–99)
Phosphorus: 5.1 mg/dL — ABNORMAL HIGH (ref 2.5–4.6)
Potassium: 3.7 mmol/L (ref 3.5–5.1)
Sodium: 131 mmol/L — ABNORMAL LOW (ref 135–145)

## 2022-03-19 LAB — VITAMIN B12: Vitamin B-12: 879 pg/mL (ref 180–914)

## 2022-03-19 MED ORDER — FENTANYL CITRATE PF 50 MCG/ML IJ SOSY: 25.0000 ug | PREFILLED_SYRINGE | INTRAMUSCULAR | Status: DC | PRN

## 2022-03-19 MED ORDER — HYDROXYZINE HCL 10 MG/5ML PO SYRP
25.0000 mg | ORAL_SOLUTION | Freq: Two times a day (BID) | ORAL | Status: DC | PRN
  Administered 2022-03-19 – 2022-03-23 (×4): 25 mg
  Filled 2022-03-19 (×6): qty 12.5

## 2022-03-19 MED ORDER — HYDROXYZINE HCL 10 MG/5ML PO SYRP
10.0000 mg | ORAL_SOLUTION | Freq: Two times a day (BID) | ORAL | Status: DC | PRN
Start: 2022-03-19 — End: 2022-03-19

## 2022-03-19 MED ORDER — OXYCODONE HCL 5 MG/5ML PO SOLN: 5.0000 mg | Freq: Once | ORAL | Status: DC

## 2022-03-19 MED ORDER — LOPERAMIDE HCL 1 MG/7.5ML PO SUSP
2.0000 mg | ORAL | Status: DC | PRN
  Administered 2022-03-19: 2 mg
  Filled 2022-03-19 (×2): qty 15

## 2022-03-19 MED ORDER — SODIUM CHLORIDE 0.9% FLUSH: 3.0000 mL | INTRAVENOUS | Status: DC | PRN

## 2022-03-19 NOTE — Progress Notes (Addendum)
? ?PROGRESS NOTE ? ? ? ?Jeffrey Ayala  XTG:[id number] DOB: June 30, 1961 [name]: 02/22/2022 ?PCP: Patient, No Pcp Per (Inactive) ? ? ?Brief Narrative: ?Jeffrey Ayala is a 61 y.o. male with PMH significant for arthritis, bilateral knee replacement, gastric ulcer, kidney stones, eosinophilic esophagitis, migraines not on any regular meds.   ?Patient presented to the ED on 4/2 with complaint of nausea, vomiting, diarrhea and confusion for 24 hours.   ?EMS noted him to be in A-fib with a heart rate of 120-160 bpm. ?In the ED, he had a fever of 102.9. ?CT abdomen pelvis showed swollen scrotum concerning for infection, colon filled with diarrhea.  ?He was hypotensive requiring multiple vasopressors in the ICU.   ?4/3, patient required intubation and also had a brief cardiac arrest postintubation.   ?4/5, in an effort to correct vasoplegia, patient was also started on [name] ?Because of septic shock, patient's renal function worsened. ?4/6, patient was started on CRRT ?4/11, patient was extubated. ?4/14, patient was transferred out to [facility] service. ?4/23, underwent left transmetatarsal amputation and right BKA by [name] ?4/24, scrotum debridement and subsequent ICU readmission ?4/27, transfer to [facility] service ? ?Assessment and Plan: ? ?Septic shock ?Present on admission. Secondary to scrotal cellulitis. Patient required ICU admission with vasopressor support. Patient's underlying infection was managed with antibiotics with improvement of shock. Now resolved. ? ?Scrotal cellulitis ?Urology consulted and performed debridement of scrotum on 4/24. Wound cultures significant for enterococcus and pseudomonas with final results pending. Patient is currently managed on Vancomycin and Cefepime ?-Continue Vancomycin and Cefepime pending final culture results/sensitivities ? ?Dry gangrene of bilateral feet ?Patient is s/p left TMA and right BKA. ? ?AKI ?Anuric and now dialysis dependant. Patient was managed on CRRT from  4/6 until 4/11 and has transitioned to intermittent hemodialysis per nephrology service. ? ?Paroxysmal atrial fibrillation ?New diagnosis. CHA2DS2-VASc Score is 1.Transthoracic Echocardiogram with preserved LVEF. Mostly rate controlled but did have some associated tachycardia while agitated. Initially was managed on heparin IV which was discontinued secondary to anemia. Cardiology consulted. Patient started on metoprolol 37.5 mg TID and aspirin 81 mg.  ?-Continue metoprolol 37.5 mg TID and aspirin 81 mg daily ? ?Acute metabolic encephalopathy ?Unsure of specific etiology. Possibly secondary to critical illness but also complicated by uremia and concern for possible hypoxemic injury while in septic shock. CT head unremarkable for acute process. Patient started on Seroquel for hopeful management with not much improvement. Patient with waxing and waning symptoms.  ?-Check vitamin B1 and B12 ? ?Acute anemia ?Baseline hemoglobin of 13 prior to admission. Patient with downward drifting hemoglobin without evidence of active bleeding. Possible combination of chronic disease and perioperative blood loss. ? ?Diarrhea ?Persistent issue. GI panel negative. Patient is on tube feeds which may be contributing. Rectal tube placed. Will continue to watch for improvement of symptoms. ? ?Shock liver ?Secondary to sepsis. AST/ALT elevations resolved. Bilirubin improved. ? ?Acute respiratory failure with hypoxia ?Patient intubated from 4/3 - 4/10, 4/23 - 4/23, 4/24 - 4/25 ? ?Acute gout ?Bilateral elbows and left hand. Patient started on allopurinol and colchicine. Prednisone avoided secondary to concern for impaired wound healing. Colchicine discontinued. ?-Continue allopurinol ? ?Demand ischemia ?Significant troponin elevation trending up to 3,550 before checks were ceased. In setting of septic shock and cardiac arrest. Transthoracic Echocardiogram without WMA and with normal LVEF. Per cardiology, likely demand ischemia. ? ?Poor oral  intake ?Moderate malnutrition ?In setting of altered mentation. Dietitian consulted. Small bore NG tube placed on 4/17  and tube feeds initiated. ?-Dietitian recommendations (4/26): ?Continue tube feeds via Cortrak tube: ?Nepro @ 50 ml/hr (1200 ml/day) ?ProSource TF 45 ml QID ?Tube feeding regimen provides 2320 kcal, 141 grams of protein, and 872 ml of H2O. ?Continue renal MVI daily per tube ?Continue 1 packet Juven BID per tube, each packet provides 95 calories, 2.5 grams of protein, and 9.8 grams of carbohydrate; also contains 7 grams of L-arginine and L-glutamine, 300 mg vitamin C, 15 mg vitamin E, 1.2 mcg vitamin B-12, 9.5 mg zinc, 200 mg calcium, and 1.5 g  Calcium Beta-hydroxy-Beta-methylbutyrate to support wound healing ?RD will monitor for diet advancement and add oral nutrition supplements as appropriate ? ?Obesity ?Body mass index is 37.39 kg/m?. ? ?Pressure injury ?Medial coccyx. POA. ? ? ?DVT prophylaxis: Heparin subq ?Code Status:   Code Status: Full Code ?Family Communication: Wife at bedside ?Disposition Plan: Discharge pending improvement of mental status and advancement of diet.  ? ? ?Consultants:  ?PCCM ?Urology ?Orthopedic surgery ?Vascular surgery ?Cardiology ?Nephrology ? ?Procedures:  ?LEFT TRANSMETATARSAL AMPUTATION, RIGHT BELOW KNEE AMPUTATION (4/23) ?Debridement of scrotum (4/24) ? ?Antimicrobials: ?Vancomycin ?Cefepime ?Cefazolin ?Zosyn ?Linezolid ? ? ?Subjective: ?Patient reports no issues but is also altered. ? ?Objective: ?BP (!) 146/71 (BP Location: Left Arm)   Pulse (!) 114   Temp 99.6 ?F (37.6 ?C) (Oral)   Resp 20   Ht '5\' 10"'$  (1.778 m)   Wt 118.2 kg   SpO2 96%   BMI 37.39 kg/m?  ? ?Examination: ? ?General exam: No distress ?Respiratory system: Clear to auscultation. Respiratory effort normal. ?Cardiovascular system: S1 & S2 heard, irregular rhythm with normal rate. ?Gastrointestinal system: Abdomen is moderately distended, soft and nontender. Normal bowel sounds heard. ?Central  nervous system: Alert and oriented to self today. No notable focal deficits. Follows commands. Dyskinesia. ?Musculoskeletal: No calf tenderness on left. Right BKA and left TMA noted ?Psychiatry: Judgement and insight appear impaired. Blunt affect.  ? ? ?Data Reviewed: I have personally reviewed following labs and imaging studies ? ?CBC ?Lab Results  ?Component Value Date  ? WBC 9.5 03/19/2022  ? RBC 2.75 (L) 03/19/2022  ? HGB 8.0 (L) 03/19/2022  ? HCT 25.1 (L) 03/19/2022  ? MCV 91.3 03/19/2022  ? MCH 29.1 03/19/2022  ? PLT 235 03/19/2022  ? MCHC 31.9 03/19/2022  ? RDW 15.6 (H) 03/19/2022  ? LYMPHSABS 0.9 03/16/2022  ? MONOABS 0.9 03/16/2022  ? EOSABS 0.0 03/16/2022  ? BASOSABS 0.1 03/16/2022  ? ? ? ?Last metabolic panel ?Lab Results  ?Component Value Date  ? NA 131 (L) 03/19/2022  ? K 3.7 03/19/2022  ? CL 95 (L) 03/19/2022  ? CO2 22 03/19/2022  ? BUN 72 (H) 03/19/2022  ? CREATININE 5.35 (H) 03/19/2022  ? GLUCOSE 141 (H) 03/19/2022  ? GFRNONAA 11 (L) 03/19/2022  ? GFRAA >60 09/14/2019  ? CALCIUM 8.2 (L) 03/19/2022  ? PHOS 5.1 (H) 03/19/2022  ? PROT 6.8 03/14/2022  ? ALBUMIN 2.0 (L) 03/19/2022  ? BILITOT 2.4 (H) 03/14/2022  ? ALKPHOS 165 (H) 03/14/2022  ? AST 41 03/14/2022  ? ALT 29 03/14/2022  ? ANIONGAP 14 03/19/2022  ? ? ?GFR: ?Estimated Creatinine Clearance: 18.7 mL/min (A) (by C-G formula based on SCr of 5.35 mg/dL (H)). ? ?Recent Results (from the past 240 hour(s))  ?Gastrointestinal Panel by PCR , Stool     Status: None  ? Collection Time: 03/09/22  6:08 PM  ? Specimen: Stool  ?Result Value Ref Range Status  ? Campylobacter species NOT DETECTED  NOT DETECTED Final  ? Plesimonas shigelloides NOT DETECTED NOT DETECTED Final  ? Salmonella species NOT DETECTED NOT DETECTED Final  ? Yersinia enterocolitica NOT DETECTED NOT DETECTED Final  ? Vibrio species NOT DETECTED NOT DETECTED Final  ? Vibrio cholerae NOT DETECTED NOT DETECTED Final  ? Enteroaggregative E coli (EAEC) NOT DETECTED NOT DETECTED Final  ?  Enteropathogenic E coli (EPEC) NOT DETECTED NOT DETECTED Final  ? Enterotoxigenic E coli (ETEC) NOT DETECTED NOT DETECTED Final  ? Shiga like toxin producing E coli (STEC) NOT DETECTED NOT DETECTED Final  ? Shigella/Ent

## 2022-03-19 NOTE — Progress Notes (Signed)
IP rehab admissions - I spoke with bedside nurse today.  Patient continues to be confused and had wrist restraints on last night.  Wife at bedside.  Not yet participating fully with PT/OT.  Not medically ready for CIR but will continue to follow for progress.  Call for questions.  [phone] ?

## 2022-03-19 NOTE — Progress Notes (Signed)
Physical Therapy Treatment ?Patient Details ?Name: Jeffrey Ayala ?MRN: [id number] ?DOB: 1961/04/13 ?Today's Date: 03/19/2022 ? ? ?History of Present Illness Pt adm 4/2 c/o N/V/D and found to have septic shock due to scrotal cellulitis vs RLL PNA.  Pt intubated 4/3 and brief cardiac arrest post intubation. [name] started 4/5.  Pt with AKI and started on CRRT on 4/6. Pt extubated 4/10. Due to gangrene of B LE, pt underwent R BKA and L transmet amputations on 4/23. On 4/24, pt underwent I&D of scrotum due to dry gangrene. Pt also with afib with rvr. PMH - Bil TKR, migraines ? ?  ?PT Comments  ? ? Pt moving much more in the bed. Cognition remains significantly impaired but pt following commands more consistently today and able to work on more upright sitting/balance as well as amputee exercises. Hope that cognition will improve enough to continue progress and hopefully possibly acute inpatient rehab.    ?Recommendations for follow up therapy are one component of a multi-disciplinary discharge planning process, led by the attending physician.  Recommendations may be updated based on patient status, additional functional criteria and insurance authorization. ? ?Follow Up Recommendations ? Acute inpatient rehab (3hours/day) ?  ?  ?Assistance Recommended at Discharge Frequent or constant Supervision/Assistance  ?Patient can return home with the following Two people to help with walking and/or transfers;Two people to help with bathing/dressing/bathroom;Assistance with cooking/housework;Assistance with feeding;Direct supervision/assist for medications management;Direct supervision/assist for financial management;Assist for transportation;Help with stairs or ramp for entrance ?  ?Equipment Recommendations ? Wheelchair (measurements PT);Wheelchair cushion (measurements PT);Hospital bed;Other (comment) (hoyer lift.)  ?  ?Recommendations for Other Services   ? ? ?  ?Precautions / Restrictions Precautions ?Precautions:  Fall;Other (comment) ?Precaution Comments: cortrak, B LE wound vacs ?Restrictions ?Weight Bearing Restrictions: Yes ?RLE Weight Bearing: Non weight bearing ?LLE Weight Bearing: Non weight bearing  ?  ? ?Mobility ? Bed Mobility ?Overal bed mobility: Needs Assistance ?Bed Mobility: Rolling ?Rolling: Min assist, Max assist ?  ?  ?  ?  ?General bed mobility comments: Min assist to roll to the lt and max assist to roll to the lt. Placed bed in chair position and pt able to pull forward using my hand and wife's hand with +2 mod assist and bring trunk all the way forward off of HOB. Performed x 4. ?  ? ?Transfers ?  ?  ?  ?  ?  ?  ?  ?  ?  ?  ?  ? ?Ambulation/Gait ?  ?  ?  ?  ?  ?  ?  ?  ? ? ?Stairs ?  ?  ?  ?  ?  ? ? ?Wheelchair Mobility ?  ? ?Modified Rankin (Stroke Patients Only) ?  ? ? ?  ?Balance Overall balance assessment: Needs assistance ?Sitting-balance support: Single extremity supported, Bilateral upper extremity supported, Feet unsupported ?Sitting balance-Leahy Scale: Poor ?Sitting balance - Comments: min to mod assist for unsupported modified long sitting in bed ?Postural control: Posterior lean ?  ?  ?  ?  ?  ?  ?  ?  ?  ?  ?  ?  ?  ?  ?  ? ?  ?Cognition Arousal/Alertness: Awake/alert (awakens to stimuli but appears to intermittently doze off) ?Behavior During Therapy: Restless, Impulsive ?Overall Cognitive Status: Impaired/Different from baseline ?Area of Impairment: Attention, Memory, Following commands, Safety/judgement, Problem solving, Awareness, Orientation ?  ?  ?  ?  ?  ?  ?  ?  ?Orientation  Level: Place, Time, Situation ?Current Attention Level: Focused ?Memory: Decreased recall of precautions, Decreased short-term memory ?Following Commands: Follows one step commands inconsistently (poor initiation) ?Safety/Judgement: Decreased awareness of safety, Decreased awareness of deficits ?Awareness: Intellectual ?Problem Solving: Slow processing, Decreased initiation, Difficulty sequencing, Requires verbal  cues, Requires tactile cues ?  ?  ?  ? ?  ?Exercises Amputee Exercises ?Knee Extension: AROM, Right, 5 reps, Supine ?Straight Leg Raises: AROM, Right, 5 reps, Left, AAROM ?Other Exercises ?Other Exercises: PROM to put LLE into full extension with hips rotated into neutral. Pt with tendency to rest in from leg position ? ?  ?General Comments General comments (skin integrity, edema, etc.): VSS on RA ?  ?  ? ?Pertinent Vitals/Pain Pain Assessment ?Pain Assessment: Faces ?Faces Pain Scale: No hurt  ? ? ?Home Living   ?  ?  ?  ?  ?  ?  ?  ?  ?  ?   ?  ?Prior Function    ?  ?  ?   ? ?PT Goals (current goals can now be found in the care plan section) Acute Rehab PT Goals ?Patient Stated Goal: not stated ?Progress towards PT goals: Progressing toward goals ? ?  ?Frequency ? ? ? Min 3X/week ? ? ? ?  ?PT Plan Current plan remains appropriate  ? ? ?Co-evaluation PT/OT/SLP Co-Evaluation/Treatment: Yes ?  ?  ?  ?  ? ?  ?AM-PAC PT "6 Clicks" Mobility   ?Outcome Measure ? Help needed turning from your back to your side while in a flat bed without using bedrails?: A Lot ?Help needed moving from lying on your back to sitting on the side of a flat bed without using bedrails?: Total ?Help needed moving to and from a bed to a chair (including a wheelchair)?: Total ?Help needed standing up from a chair using your arms (e.g., wheelchair or bedside chair)?: Total ?Help needed to walk in hospital room?: Total ?Help needed climbing 3-5 steps with a railing? : Total ?6 Click Score: 7 ? ?  ?End of Session   ?Activity Tolerance: Patient tolerated treatment well ?Patient left: in bed;with call bell/phone within reach;with family/visitor present ?Nurse Communication: Mobility status;Need for lift equipment ?PT Visit Diagnosis: Other abnormalities of gait and mobility (R26.89);Muscle weakness (generalized) (M62.81) ?  ? ? ?Time: 2297-9892 ?PT Time Calculation (min) (ACUTE ONLY): 21 min ? ?Charges:  $Therapeutic Activity: 8-22 mins           ?          ? ?[facility] PT ?Acute Rehabilitation Services ?Office [phone] ? ? ? ?Shary Decamp [facility] ?03/19/2022, 5:18 PM ? ?

## 2022-03-19 NOTE — Progress Notes (Signed)
Speech Language Pathology Treatment: Dysphagia  ?Patient Details ?Name: Jeffrey Ayala ?MRN: [id number] ?DOB: [birth year] ?Today's Date: 03/19/2022 ?Time: 5732-2025 ?SLP Time Calculation (min) (ACUTE ONLY): 14 min ? ?Assessment / Plan / Recommendation ?Clinical Impression ? Pt drowsy but awake most of session until pain meds took effect (also had Haldol last night and Seroquel today). He exhibited slightly improved ability to attend to trials applesauce and straw sips thin with wife at bedside. He attempted to hold cup however was unable to keep arm upright. Appropriate acceptance with straw and spoon today, no biting or turning head. Appreciated swallows that appeared timely and no signs of intrusion during session. He began to fall asleep due to Oxycodone given (this is scheduled every 4 hours) and stated he did not want more. Although improvements seen, recommend continue Cortrak feedings given his inconsistent alertness but allow ice chips after oral care with nursing or wife. ST will follow.  ?   ?HPI HPI: 61 y.o. male presented to the [facility] ED with a chief complaint of nausea, vomiting, diarrhea. Dx septic shock (sources could be scrotal cellulitis versus right lower lobe aspiration pneumonia), afib, acute hypoxemic resp failure requiring intubation 4/3-4/10, AKI from septic shock, metabolic encephalopathy. BSE 4/10 full liquids (pt choice), BSE 4/17 recommended regular texture, thin liquids, On 4/23 pt underwent amputation of left transmetatarsal then 4/24 underwent debridement of scrotum. ?  ?   ?SLP Plan ? Continue with current plan of care ? ?  ?  ?Recommendations for follow up therapy are one component of a multi-disciplinary discharge planning process, led by the attending physician.  Recommendations may be updated based on patient status, additional functional criteria and insurance authorization. ?  ? ?Recommendations  ?Diet recommendations: Other(comment) (NPO except ice chips) ?Medication Administration:  Via alternative means  ?   ?    ?   ? ? ? ? General recommendations: Rehab consult ?Oral Care Recommendations: Oral care QID ?Follow Up Recommendations: Acute inpatient rehab (3hours/day) ?Assistance recommended at discharge: Frequent or constant Supervision/Assistance ?SLP Visit Diagnosis: Dysphagia, unspecified (R13.10) ?Plan: Continue with current plan of care ? ? ? ? ?  ?  ? ? ?Houston Siren ? ?03/19/2022, 10:55 AM ?

## 2022-03-19 NOTE — Progress Notes (Signed)
Patient ID: Jeffrey Ayala, male   DOB: July 23, 1961, 61 y.o.   MRN: [id number] ? ?3 Days Post-Op ?Subjective: ?Pt extubated.  Confused. Denies pain.   ? ?Objective: ?Vital signs in last 24 hours: ?Temp:  [97.5 ?F (36.4 ?C)-99.6 ?F (37.6 ?C)] 99.6 ?F (37.6 ?C) (04/27 0400) ?Pulse Rate:  [85-145] 114 (04/27 0400) ?Resp:  [12-27] 20 (04/27 0400) ?BP: (103-150)/(54-85) 146/71 (04/27 0400) ?SpO2:  [86 %-100 %] 96 % (04/27 0400) ? ?Intake/Output from previous day: ?04/26 0701 - 04/27 0700 ?In: 2359.3 [I.V.:74.1; Blood:315; NG/GT:1670; IV Piggyback:300.2] ?Out: 2000  ?Intake/Output this shift: ?Total I/O ?In: 886.6 [I.V.:66.5; NG/GT:720; IV Piggyback:100.1] ?Out: 0  ? ?Physical Exam:  ?GU: Dressing changed this morning. Wound clean but with more fibrinous tissue especially toward the upper right part of the wound.  No purulent drainage or erythema. ? ?Lab Results: ?Recent Labs  ?  03/18/22 ?0602 03/18/22 ?1435 03/19/22 ?0154  ?HGB 6.7* 7.9* 8.0*  ?HCT 21.3* 23.8* 25.1*  ? ? ?  Latest Ref Rng & Units 03/19/2022  ?  1:54 AM 03/18/2022  ?  2:35 PM 03/18/2022  ?  6:02 AM  ?CBC  ?WBC 4.0 - 10.5 K/uL 9.5   8.3   7.3    ?Hemoglobin 13.0 - 17.0 g/dL 8.0   7.9   6.7    ?Hematocrit 39.0 - 52.0 % 25.1   23.8   21.3    ?Platelets 150 - 400 K/uL 235   213   203    ? ? ? ? ?BMET ?Recent Labs  ?  03/18/22 ?0602 03/19/22 ?0154  ?NA 133* 131*  ?K 4.6 3.7  ?CL 98 95*  ?CO2 21* 22  ?GLUCOSE 122* 141*  ?BUN 110* 72*  ?CREATININE 8.42* 5.35*  ?CALCIUM 8.1* 8.2*  ? ? ? ?Studies/Results: ?No results found. ? ?Assessment/Plan: ?1) Scrotal wound: S/P debridement 4/24.  Continue local wound care with wet to dry saline dressing changes. Reviewed dressing care with nursing. Will consider scrotal skin repair vs skin grafting in future after wound is clean and patient has significantly improved his overall medical condition.  Will keep patient's wife updated. ?2) Renal failure:  Starting to produce some UOP now.  Indwelling Foley not a great option as he  may pull this out and condom catheters have been problematic for the same reason.  To avoid contamination of the scrotal wound and to get accurate I and Os, would consider CIC at least twice daily for management. ? ? LOS: 25 days  ? ?[name] ?03/19/2022, 6:50 AM ? ?  ?

## 2022-03-19 NOTE — Progress Notes (Signed)
eLink Physician-Brief Progress Note ?Patient Name: Jeffrey Ayala ?DOB: August 14, 1961 ?MRN: [id number] ? ? ?Date of Service ? 03/19/2022  ?HPI/Events of Note ? Patient is having frequent watery stools.  ?eICU Interventions ? Flexiseal ordered.  ? ? ? ?  ? ?Kerry Kass Shanese Riemenschneider ?03/19/2022, 2:23 AM ?

## 2022-03-19 NOTE — TOC Progression Note (Signed)
Transition of Care (TOC) - Progression Note  ? ? ?Patient Details  ?Name: Jeffrey Ayala ?MRN: [id number] ?Date of Birth: 11/21/1961 ? ?Transition of Care (TOC) CM/SW Contact  ?Tom-Johnson, Renea Ee, RN ?Phone Number: ?03/19/2022, 2:19 PM ? ?Clinical Narrative:    ? ?CM updated patient's wife at bedside on status of patient's discharge disposition. Patient is not medically ready for discharge. CM will continue to follow with needs. ? ?Expected Discharge Plan: [place] ?Barriers to Discharge: Continued Medical Work up ? ?Expected Discharge Plan and Services ?Expected Discharge Plan: [place] ?  ?Discharge Planning Services: CM Consult ?  ?Living arrangements for the past 2 months: [place] ?                ?  ?  ?  ?  ?  ?  ?  ?  ?  ?  ? ? ?Social Determinants of Health (SDOH) Interventions ?  ? ?Readmission Risk Interventions ?   ? View : No data to display.  ?  ?  ?  ? ? ?

## 2022-03-19 NOTE — Progress Notes (Signed)
?[facility] ?Progress Note  ? ? ?Assessment/ Plan:   ?Anuric dialysis dependent AKI: in the setting of severe septic shock likely ATN.  Normal GFR baseline ?started CRRT 02/26/22 - 4/11, now on iHD ?R IJ TDC ?Tentative HD on MWF schedule moving forward: 2K, 3.5h, TDC, No heparin, 1-2L UF ?As best able follow change in labs and urine output to evaluate GFR recovery ?Septic Shock resolved ?Scrotal Edema / wound s/p debridement 4/24 with urology ?S/p L TMA and R BKA 03/15/22 with [name] ?Acute hypoxic RF: recurrent intubation post op 4/24, per CCM and now extubated on room air ?Thrombocytopenia/ coagulopathy: resolved ?Anemia: Arensp qSat.  No Fe with infection.  Transfusions if needed ?Afib with RVR- hep--> bivalirudin gtt -> now holding anticoagulation ?Foot wounds: associated with high-dose vasopressor requirements. Vascular and orthopedic surgery onboard. S/p rt BKA and lt TMA on 4/23. ?Hyperphosphatemia: Sevelamer 3 tabs TIDM when eating ?  ?Subjective:   ? ?HD yesterday 2 L UF ?Had some confusion overnight ?Wife, at bedside, reports some urine output but not able to record quantity ?On room air, BP is stable  ? ?Objective:   ?BP 117/73   Pulse (!) 102   Temp 98.5 ?F (36.9 ?C) (Oral)   Resp 17   Ht '5\' 10"'$  (1.778 m)   Wt 118.2 kg   SpO2 97%   BMI 37.39 kg/m?  ? ?Intake/Output Summary (Last 24 hours) at 03/19/2022 1150 ?Last data filed at 03/19/2022 1000 ?Gross per 24 hour  ?Intake 2193.75 ml  ?Output 300 ml  ?Net 1893.75 ml  ? ? ?Weight change:  ? ?Physical Exam: ?GEN: Confused, somewhat restless ?HEENT eyes open ?PULM coarse bs b/l ?CV Normal rate, no audible rub ?ABD distended, soft, bowel sounds present ?GU: swollen scrotum w/ dry gangrene ?EXT right BKA, left TMA with wound vac ?ACCESS: RIJ TC ? ?Imaging: ?No results found. ? ?Labs: ?BMET ?Recent Labs  ?Lab 03/14/22 ?0148 03/15/22 ?1583 03/15/22 ?0940 03/16/22 ?0602 03/16/22 ?1343 03/16/22 ?1703 03/16/22 ?2004 03/17/22 ?7680 03/18/22 ?0602  03/19/22 ?0154  ?NA 132*  133* 132* 132* 133* 134* 134* 131* 130* 133* 131*  ?K 4.5  4.5 4.3 4.4 5.2* 5.1 4.7 4.2 4.5 4.6 3.7  ?CL 96*  96* 95* 97* 97* 99 100  --  95* 98 95*  ?CO2 18*  18* 22  --  18* 20*  --   --  19* 21* 22  ?GLUCOSE 130*  130* 118* 114* 141* 115* 113*  --  109* 122* 141*  ?BUN 97*  96* 72* 91* 105* 109* 63*  --  73* 110* 72*  ?CREATININE 8.81*  8.71* 6.60* 6.70* 8.31* 8.93* 5.80*  --  6.55* 8.42* 5.35*  ?CALCIUM 8.3*  8.3* 8.5*  --  8.2* 8.2*  --   --  8.0* 8.1* 8.2*  ?PHOS 9.9* 7.0*  --  8.1* 8.1*  --   --  7.2* 8.3* 5.1*  ? ? ?CBC ?Recent Labs  ?Lab 03/14/22 ?0148 03/15/22 ?8811 03/15/22 ?0315 03/16/22 ?0602 03/16/22 ?1703 03/16/22 ?2004 03/18/22 ?0602 03/18/22 ?1435 03/19/22 ?0154  ?WBC 10.5 9.0  --  12.2*  --   --  7.3 8.3 9.5  ?NEUTROABS 8.3* 7.1  --  10.1*  --   --   --   --   --   ?HGB 7.4* 8.1*   < > 7.7*   < > 9.9* 6.7* 7.9* 8.0*  ?HCT 23.0* 25.2*   < > 24.0*   < > 29.0* 21.3* 23.8*  25.1*  ?MCV 90.6 91.0  --  90.9  --   --  93.4 90.2 91.3  ?PLT 293 257  --  228  --   --  203 213 235  ? < > = values in this interval not displayed.  ? ? ? ?Medications:   ? ? sodium chloride   Intravenous Once  ? allopurinol  100 mg Per Tube Daily  ? vitamin C  1,000 mg Per Tube Daily  ? aspirin  81 mg Per Tube Daily  ? Chlorhexidine Gluconate Cloth  6 each Topical Q0600  ? [START ON 03/23/2022] darbepoetin (ARANESP) injection - DIALYSIS  200 mcg Intravenous Q Mon-HD  ? feeding supplement (PROSource TF)  45 mL Per Tube QID  ? fiber  1 packet Per Tube BID  ? folic acid  1 mg Per Tube Daily  ? guaiFENesin  400 mg Per Tube Q8H  ? heparin injection (subcutaneous)  5,000 Units Subcutaneous Q8H  ? insulin aspart  0-15 Units Subcutaneous Q4H  ? leptospermum manuka honey  1 application. Topical Daily  ? melatonin  3 mg Per Tube QHS  ? metoprolol tartrate  37.5 mg Per Tube TID  ? multivitamin  1 tablet Per Tube QHS  ? mupirocin ointment  1 application. Nasal BID  ? nutrition supplement (JUVEN)  1 packet Per  Tube BID BM  ? oxyCODONE  5 mg Per Tube Q4H  ? pantoprazole sodium  40 mg Per Tube Daily  ? QUEtiapine  25 mg Per Tube Daily  ? QUEtiapine  50 mg Per Tube QHS  ? sevelamer carbonate  2.4 g Per Tube Q8H  ? sodium chloride flush  3 mL Intravenous Q12H  ? zinc sulfate  220 mg Per Tube Daily  ? ? ? ?Rexene Agent ? ?03/19/2022, 11:50 AM   ?

## 2022-03-20 DIAGNOSIS — R4182 Altered mental status, unspecified: Secondary | ICD-10-CM | POA: Diagnosis not present

## 2022-03-20 DIAGNOSIS — I96 Gangrene, not elsewhere classified: Secondary | ICD-10-CM | POA: Diagnosis not present

## 2022-03-20 DIAGNOSIS — N179 Acute kidney failure, unspecified: Secondary | ICD-10-CM | POA: Diagnosis not present

## 2022-03-20 DIAGNOSIS — A419 Sepsis, unspecified organism: Secondary | ICD-10-CM | POA: Diagnosis not present

## 2022-03-20 DIAGNOSIS — E669 Obesity, unspecified: Secondary | ICD-10-CM

## 2022-03-20 LAB — CBC
HCT: 24.7 % — ABNORMAL LOW (ref 39.0–52.0)
Hemoglobin: 7.7 g/dL — ABNORMAL LOW (ref 13.0–17.0)
MCH: 28.8 pg (ref 26.0–34.0)
MCHC: 31.2 g/dL (ref 30.0–36.0)
MCV: 92.5 fL (ref 80.0–100.0)
Platelets: 225 10*3/uL (ref 150–400)
RBC: 2.67 MIL/uL — ABNORMAL LOW (ref 4.22–5.81)
RDW: 15.4 % (ref 11.5–15.5)
WBC: 8.4 10*3/uL (ref 4.0–10.5)
nRBC: 0 % (ref 0.0–0.2)

## 2022-03-20 LAB — GLUCOSE, CAPILLARY
Glucose-Capillary: 104 mg/dL — ABNORMAL HIGH (ref 70–99)
Glucose-Capillary: 108 mg/dL — ABNORMAL HIGH (ref 70–99)
Glucose-Capillary: 115 mg/dL — ABNORMAL HIGH (ref 70–99)
Glucose-Capillary: 124 mg/dL — ABNORMAL HIGH (ref 70–99)
Glucose-Capillary: 128 mg/dL — ABNORMAL HIGH (ref 70–99)
Glucose-Capillary: 135 mg/dL — ABNORMAL HIGH (ref 70–99)

## 2022-03-20 LAB — BILIRUBIN, FRACTIONATED(TOT/DIR/INDIR)
Bilirubin, Direct: 0.7 mg/dL — ABNORMAL HIGH (ref 0.0–0.2)
Indirect Bilirubin: 1 mg/dL — ABNORMAL HIGH (ref 0.3–0.9)
Total Bilirubin: 1.7 mg/dL — ABNORMAL HIGH (ref 0.3–1.2)

## 2022-03-20 LAB — RENAL FUNCTION PANEL
Albumin: 2.1 g/dL — ABNORMAL LOW (ref 3.5–5.0)
Anion gap: 14 (ref 5–15)
BUN: 109 mg/dL — ABNORMAL HIGH (ref 8–23)
CO2: 20 mmol/L — ABNORMAL LOW (ref 22–32)
Calcium: 8.4 mg/dL — ABNORMAL LOW (ref 8.9–10.3)
Chloride: 95 mmol/L — ABNORMAL LOW (ref 98–111)
Creatinine, Ser: 7.16 mg/dL — ABNORMAL HIGH (ref 0.61–1.24)
GFR, Estimated: 8 mL/min — ABNORMAL LOW (ref >60)
Glucose, Bld: 132 mg/dL — ABNORMAL HIGH (ref 70–99)
Phosphorus: 6.5 mg/dL — ABNORMAL HIGH (ref 2.5–4.6)
Potassium: 4.1 mmol/L (ref 3.5–5.1)
Sodium: 129 mmol/L — ABNORMAL LOW (ref 135–145)

## 2022-03-20 LAB — VITAMIN B1: Vitamin B1 (Thiamine): 112.8 nmol/L (ref 66.5–200.0)

## 2022-03-20 LAB — LACTATE DEHYDROGENASE: LDH: 256 U/L — ABNORMAL HIGH (ref 98–192)

## 2022-03-20 MED ORDER — HALOPERIDOL LACTATE 5 MG/ML IJ SOLN
2.0000 mg | Freq: Four times a day (QID) | INTRAMUSCULAR | Status: DC | PRN
  Administered 2022-03-20 – 2022-03-21 (×2): 2 mg via INTRAVENOUS
  Filled 2022-03-20 (×2): qty 1

## 2022-03-20 MED ORDER — OXYCODONE HCL 5 MG PO TABS
5.0000 mg | ORAL_TABLET | ORAL | Status: DC | PRN
  Administered 2022-03-20: 5 mg
  Filled 2022-03-20: qty 1

## 2022-03-20 MED ORDER — NON FORMULARY: Freq: Two times a day (BID) | Status: DC

## 2022-03-20 MED ORDER — ACETAMINOPHEN 160 MG/5ML PO SOLN
650.0000 mg | Freq: Four times a day (QID) | ORAL | Status: DC
  Administered 2022-03-20 – 2022-03-23 (×10): 650 mg
  Filled 2022-03-20 (×11): qty 20.3

## 2022-03-20 MED ORDER — DEXTROSE-NACL 5-0.9 % IV SOLN: INTRAVENOUS | Status: AC

## 2022-03-20 MED ORDER — DAKINS (FULL STRENGTH) SOLUTION 0.5%
Freq: Two times a day (BID) | CUTANEOUS | Status: DC
  Administered 2022-03-20 – 2022-03-25 (×4): 1
  Filled 2022-03-20 (×4): qty 1000

## 2022-03-20 NOTE — Progress Notes (Signed)
Patient ID: Jeffrey Ayala, male   DOB: [birth year], 61 y.o.   MRN: [id number] ? ?4 Days Post-Op ?Subjective: ?Pt moved to floor yesterday.  Still confused and combative. ? ?Objective: ?Vital signs in last 24 hours: ?Temp:  [97.9 ?F (36.6 ?C)-98.9 ?F (37.2 ?C)] 98.1 ?F (36.7 ?C) (04/28 0407) ?Pulse Rate:  [80-108] 104 (04/28 0407) ?Resp:  [14-24] 18 (04/28 0407) ?BP: (107-147)/(52-87) 147/75 (04/28 0407) ?SpO2:  [92 %-100 %] 92 % (04/28 0407) ? ?Intake/Output from previous day: ?04/27 0701 - 04/28 0700 ?In: 1094.9 [I.V.:109.9; NG/GT:985] ?Out: 225 [Stool:225] ?Intake/Output this shift: ?No intake/output data recorded. ? ?Physical Exam:  ?GU: Increased fibrinous material particularly at right upper portion of wound.  Otherwise no fluctuance, crepitus, increased erythema. ? ?Lab Results: ?Recent Labs  ?  03/18/22 ?1435 03/19/22 ?0154 03/20/22 ?0322  ?HGB 7.9* 8.0* 7.7*  ?HCT 23.8* 25.1* 24.7*  ? ? ?  Latest Ref Rng & Units 03/20/2022  ?  3:22 AM 03/19/2022  ?  1:54 AM 03/18/2022  ?  2:35 PM  ?CBC  ?WBC 4.0 - 10.5 K/uL 8.4   9.5   8.3    ?Hemoglobin 13.0 - 17.0 g/dL 7.7   8.0   7.9    ?Hematocrit 39.0 - 52.0 % 24.7   25.1   23.8    ?Platelets 150 - 400 K/uL 225   235   213    ? ? ? ?BMET ?Recent Labs  ?  03/19/22 ?0154 03/20/22 ?0322  ?NA 131* 129*  ?K 3.7 4.1  ?CL 95* 95*  ?CO2 22 20*  ?GLUCOSE 141* 132*  ?BUN 72* 109*  ?CREATININE 5.35* 7.16*  ?CALCIUM 8.2* 8.4*  ? ? ? ?Studies/Results: ?Tissue cultures with Enterococcus and few Pseudomonas  ? ?Assessment/Plan: ?1) Scrotal wound/infection: S/P debridement on 4/24. Current antibiotics likely appropriate pending final cultures.  May be best for total of 6 weeks of therapy but would defer to ID recommendations.  Wound with increased fibrinous material.  Would be best to avoid additional anesthetic considering his overall situation and the fact that he required prolonged intubation earlier this week.  Will change dressing changes to Dakin's solution wet to dry. ? ? LOS: 26  days  ? ?[name] ?03/20/2022, 7:26 AM ? ?  ?

## 2022-03-20 NOTE — Progress Notes (Addendum)
OT Cancellation Note ? ?Patient Details ?Name: Jeffrey Ayala ?MRN: [id number] ?DOB: December 13, 1960 ? ? ?Cancelled Treatment:    Reason Eval/Treat Not Completed: Patient at procedure or test/ unavailable Currently being transported to HD per RN. Will follow up for OT session as schedule permits. ? ?[name] ?03/20/2022, 8:06 AM ?

## 2022-03-20 NOTE — Progress Notes (Signed)
?[facility] ?Progress Note  ? ? ?Assessment/ Plan:   ?Anuric dialysis dependent AKI: in the setting of severe septic shock likely ATN.  Normal GFR baseline ?started CRRT 02/26/22 - 4/11, now on iHD ?R IJ TDC ?Tentative HD on MWF schedule moving forward: 2K, 3.5h, TDC, No heparin, 1-2L UF ?As best able follow change in labs and urine output to evaluate GFR recovery ?Septic Shock resolved ?Scrotal Edema / wound s/p debridement 4/24 with urology ?S/p L TMA and R BKA 03/15/22 with [name] ?Acute hypoxic RF: recurrent intubation post op 4/24, per CCM and now extubated on room air ?Thrombocytopenia/ coagulopathy: resolved ?Anemia: Arensp qSat.  No Fe with infection.  Transfusions if needed ?Afib with RVR- hep--> bivalirudin gtt -> now holding anticoagulation ?Foot wounds: associated with high-dose vasopressor requirements. Vascular and orthopedic surgery onboard. S/p rt BKA and lt TMA on 4/23. ?Hyperphosphatemia: Sevelamer 3 tabs TIDM when eating ?AMS: per primary, delirium likelly ?  ?Subjective:   ? ?HD today 1.9 L UF, no issues ?Had ongoing confusion overnight ?On room air, BP is stable  ? ?Objective:   ?BP 129/73   Pulse (!) 109   Temp 99 ?F (37.2 ?C) (Oral)   Resp (P) 20   Ht '5\' 10"'$  (1.778 m)   Wt (P) 119.1 kg   SpO2 (P) 95%   BMI (P) 37.67 kg/m?  ? ?Intake/Output Summary (Last 24 hours) at 03/20/2022 1447 ?Last data filed at 03/20/2022 1215 ?Gross per 24 hour  ?Intake 584.94 ml  ?Output 2125 ml  ?Net -1540.06 ml  ? ? ?Weight change:  ? ?Physical Exam: ?GEN: Confused, somewhat restless with mittens on ?HEENT eyes open ?PULM coarse bs b/l ?CV Normal rate, no audible rub ?ABD distended, soft, bowel sounds present ?GU: urology note from today reviewed ?EXT right BKA, left TMA with wound vac ?ACCESS: RIJ TC ? ?Imaging: ?No results found. ? ?Labs: ?BMET ?Recent Labs  ?Lab 03/15/22 ?1884 03/15/22 ?1660 03/16/22 ?0602 03/16/22 ?1343 03/16/22 ?1703 03/16/22 ?2004 03/17/22 ?6301 03/18/22 ?6010  03/19/22 ?9323 03/20/22 ?5573  ?NA 132*   < > 133* 134* 134* 131* 130* 133* 131* 129*  ?K 4.3   < > 5.2* 5.1 4.7 4.2 4.5 4.6 3.7 4.1  ?CL 95*   < > 97* 99 100  --  95* 98 95* 95*  ?CO2 22  --  18* 20*  --   --  19* 21* 22 20*  ?GLUCOSE 118*   < > 141* 115* 113*  --  109* 122* 141* 132*  ?BUN 72*   < > 105* 109* 63*  --  73* 110* 72* 109*  ?CREATININE 6.60*   < > 8.31* 8.93* 5.80*  --  6.55* 8.42* 5.35* 7.16*  ?CALCIUM 8.5*  --  8.2* 8.2*  --   --  8.0* 8.1* 8.2* 8.4*  ?PHOS 7.0*  --  8.1* 8.1*  --   --  7.2* 8.3* 5.1* 6.5*  ? < > = values in this interval not displayed.  ? ? ?CBC ?Recent Labs  ?Lab 03/14/22 ?0148 03/15/22 ?2202 03/15/22 ?5427 03/16/22 ?0602 03/16/22 ?1703 03/18/22 ?0602 03/18/22 ?1435 03/19/22 ?0154 03/20/22 ?0322  ?WBC 10.5 9.0  --  12.2*  --  7.3 8.3 9.5 8.4  ?NEUTROABS 8.3* 7.1  --  10.1*  --   --   --   --   --   ?HGB 7.4* 8.1*   < > 7.7*   < > 6.7* 7.9* 8.0* 7.7*  ?HCT 23.0* 25.2*   < >  24.0*   < > 21.3* 23.8* 25.1* 24.7*  ?MCV 90.6 91.0  --  90.9  --  93.4 90.2 91.3 92.5  ?PLT 293 257  --  228  --  203 213 235 225  ? < > = values in this interval not displayed.  ? ? ? ?Medications:   ? ? sodium chloride   Intravenous Once  ? acetaminophen (TYLENOL) oral liquid 160 mg/5 mL  650 mg Per Tube Q6H  ? allopurinol  100 mg Per Tube Daily  ? vitamin C  1,000 mg Per Tube Daily  ? aspirin  81 mg Per Tube Daily  ? Chlorhexidine Gluconate Cloth  6 each Topical Q0600  ? [START ON 03/23/2022] darbepoetin (ARANESP) injection - DIALYSIS  200 mcg Intravenous Q Mon-HD  ? feeding supplement (PROSource TF)  45 mL Per Tube QID  ? fiber  1 packet Per Tube BID  ? folic acid  1 mg Per Tube Daily  ? guaiFENesin  400 mg Per Tube Q8H  ? heparin injection (subcutaneous)  5,000 Units Subcutaneous Q8H  ? insulin aspart  0-15 Units Subcutaneous Q4H  ? leptospermum manuka honey  1 application. Topical Daily  ? melatonin  3 mg Per Tube QHS  ? metoprolol tartrate  37.5 mg Per Tube TID  ? multivitamin  1 tablet Per Tube QHS  ?  nutrition supplement (JUVEN)  1 packet Per Tube BID BM  ? oxyCODONE  5 mg Per Tube Once  ? pantoprazole sodium  40 mg Per Tube Daily  ? QUEtiapine  25 mg Per Tube Daily  ? QUEtiapine  50 mg Per Tube QHS  ? sevelamer carbonate  2.4 g Per Tube Q8H  ? sodium chloride flush  3 mL Intravenous Q12H  ? sodium hypochlorite   Irrigation BID  ? zinc sulfate  220 mg Per Tube Daily  ? ? ? ?Justin Mend ? ?03/20/2022, 2:47 PM   ?

## 2022-03-20 NOTE — Progress Notes (Signed)
SLP Cancellation Note ? ?Patient Details ?Name: Jeffrey Ayala ?MRN: [id number] ?DOB: 1961/03/07 ? ? ?Cancelled treatment:        Attempted to see pt for dysphagia management and PO readiness.  Pt was off floor at time of attempt for hemodialysis.  SLP will return as schedule permits. ? ? ?Denajah Farias E Tenecia Ignasiak ?03/20/2022, 10:23 AM ?

## 2022-03-20 NOTE — Progress Notes (Signed)
? ?PROGRESS NOTE ? ? ? ?Jeffrey Ayala  TFT:[id number] DOB: 1961-07-01 [name]: 02/22/2022 ?PCP: Patient, No Pcp Per (Inactive) ? ? ?Brief Narrative: ?Jeffrey Ayala is a 61 y.o. male with PMH significant for arthritis, bilateral knee replacement, gastric ulcer, kidney stones, eosinophilic esophagitis, migraines not on any regular meds.   ?Patient presented to the ED on 4/2 with complaint of nausea, vomiting, diarrhea and confusion for 24 hours.   ?EMS noted him to be in A-fib with a heart rate of 120-160 bpm. ?In the ED, he had a fever of 102.9. ?CT abdomen pelvis showed swollen scrotum concerning for infection, colon filled with diarrhea.  ?He was hypotensive requiring multiple vasopressors in the ICU.   ?4/3, patient required intubation and also had a brief cardiac arrest postintubation.   ?4/5, in an effort to correct vasoplegia, patient was also started on [name] ?Because of septic shock, patient's renal function worsened. ?4/6, patient was started on CRRT ?4/11, patient was extubated. ?4/14, patient was transferred out to [facility] service. ?4/23, underwent left transmetatarsal amputation and right BKA by Dr. Sharol Ayala ?4/24, scrotum debridement and subsequent ICU readmission ?4/27, transfer to [facility] service ? ?Assessment and Plan: ? ?Septic shock ?Present on admission. Secondary to scrotal cellulitis. Patient required ICU admission with vasopressor support. Patient's underlying infection was managed with antibiotics with improvement of shock. Now resolved. ? ?Scrotal cellulitis ?Urology consulted and performed debridement of scrotum on 4/24. Wound cultures significant for enterococcus and pseudomonas with final results pending. Patient is currently managed on Vancomycin and Cefepime ?-Continue Vancomycin and Cefepime pending final culture results/sensitivities ? ?Dry gangrene of bilateral feet ?Patient is s/p left TMA and right BKA. ? ?AKI ?Anuric and now dialysis dependant. Patient was managed on CRRT from  4/6 until 4/11 and has transitioned to intermittent hemodialysis per nephrology service. ?-Nephrology recommendations: continuing intermittent HD ? ?Paroxysmal atrial fibrillation ?New diagnosis. CHA2DS2-VASc Score is 1.Transthoracic Echocardiogram with preserved LVEF. Mostly rate controlled but did have some associated tachycardia while agitated. Initially was managed on heparin IV which was discontinued secondary to anemia. Cardiology consulted. Patient started on metoprolol 37.5 mg TID and aspirin 81 mg.  ?-Continue metoprolol 37.5 mg TID and aspirin 81 mg daily ? ?Acute metabolic encephalopathy ?Delirium ?Unsure of specific etiology. Possibly secondary to critical illness but also complicated by uremia and concern for possible hypoxemic injury while in septic shock. CT head unremarkable for acute process. Patient started on Seroquel for hopeful management with not much improvement. Patient with waxing and waning symptoms. Normal vitamin B12. ?-Follow-up Vitamin B1 ?-Delirium precautions ?-Continue Seroquel BID ?-Haldol PRN ? ?Acute anemia ?Baseline hemoglobin of 13 prior to admission. Patient with downward drifting hemoglobin without evidence of active bleeding. Possible combination of chronic disease and perioperative blood loss. Patient has received 4 units of pRBC to date. ? ?Diarrhea ?Persistent issue. GI panel negative. Patient is on tube feeds which may be contributing. Rectal tube placed. Will continue to watch for improvement of symptoms. ? ?Shock liver ?Secondary to sepsis. AST/ALT elevations resolved. Bilirubin improved. ? ?Acute respiratory failure with hypoxia ?Patient intubated from 4/3 - 4/10, 4/23 - 4/23, 4/24 - 4/25 ? ?Acute gout ?Bilateral elbows and left hand. Patient started on allopurinol and colchicine. Prednisone avoided secondary to concern for impaired wound healing. Colchicine discontinued. ?-Continue allopurinol ? ?Acute pain ?-Switch to oxycodone PRN ?-Tylenol  scheduled ? ?Hyponatremia ?Metabolic acidosis ?Hyperphosphatemia ?Management per nephrology ? ?Demand ischemia ?Significant troponin elevation trending up to 3,550 before checks were ceased. In setting  of septic shock and cardiac arrest. Transthoracic Echocardiogram without WMA and with normal LVEF. Per cardiology, likely demand ischemia. ? ?Thrombocytopenia ?Likely secondary to septic shock. Patient received a total of 1 unit of platelets. Thrombocytopenia now resolved. ? ?Poor oral intake ?Moderate malnutrition ?In setting of altered mentation. Dietitian consulted. Small bore NG tube placed on 4/17 and tube feeds initiated. ?-Dietitian recommendations (4/26): ?Continue tube feeds via Cortrak tube: ?Nepro @ 50 ml/hr (1200 ml/day) ?ProSource TF 45 ml QID ?Tube feeding regimen provides 2320 kcal, 141 grams of protein, and 872 ml of H2O. ?Continue renal MVI daily per tube ?Continue 1 packet Juven BID per tube, each packet provides 95 calories, 2.5 grams of protein, and 9.8 grams of carbohydrate; also contains 7 grams of L-arginine and L-glutamine, 300 mg vitamin C, 15 mg vitamin E, 1.2 mcg vitamin B-12, 9.5 mg zinc, 200 mg calcium, and 1.5 g  Calcium Beta-hydroxy-Beta-methylbutyrate to support wound healing ?RD will monitor for diet advancement and add oral nutrition supplements as appropriate ? ?Obesity ?Body mass index is 37.39 kg/m?. ? ?Pressure injury ?Medial coccyx. POA. ? ? ?DVT prophylaxis: Heparin subq ?Code Status:   Code Status: Full Code ?Family Communication: Wife at bedside ?Disposition Plan: Discharge pending improvement of mental status and advancement of diet.  ? ? ?Consultants:  ?PCCM ?Urology ?Orthopedic surgery ?Vascular surgery ?Cardiology ?Nephrology ? ?Procedures:  ?LEFT TRANSMETATARSAL AMPUTATION, RIGHT BELOW KNEE AMPUTATION (4/23) ?Debridement of scrotum (4/24) ? ?Antimicrobials: ?Vancomycin ?Cefepime ?Cefazolin ?Zosyn ?Linezolid ? ? ?Subjective: ?Appears to have some scrotal  pain ? ?Objective: ?BP 123/71 (BP Location: Right Arm)   Pulse 93   Temp 97.7 ?F (36.5 ?C) (Oral)   Resp 16   Ht '5\' 10"'$  (1.778 m)   Wt 118.2 kg   SpO2 94%   BMI 37.39 kg/m?  ? ?Examination: ? ?General exam: No distress ?Respiratory system: Clear to auscultation. Respiratory effort normal. ?Cardiovascular system: S1 & S2 heard, irregular rhythm with normal rate. ?Gastrointestinal system: Abdomen is moderately distended, soft and nontender. Normal bowel sounds heard. ?Central nervous system: Alert and oriented to self today. No notable focal deficits. Follows commands. Dyskinesia. ?Musculoskeletal: No calf tenderness on left. Right BKA and left TMA noted ?Psychiatry: Judgement and insight appear impaired. Blunt affect.  ? ? ?Data Reviewed: I have personally reviewed following labs and imaging studies ? ?CBC ?Lab Results  ?Component Value Date  ? WBC 8.4 03/20/2022  ? RBC 2.67 (L) 03/20/2022  ? HGB 7.7 (L) 03/20/2022  ? HCT 24.7 (L) 03/20/2022  ? MCV 92.5 03/20/2022  ? MCH 28.8 03/20/2022  ? PLT 225 03/20/2022  ? MCHC 31.2 03/20/2022  ? RDW 15.4 03/20/2022  ? LYMPHSABS 0.9 03/16/2022  ? MONOABS 0.9 03/16/2022  ? EOSABS 0.0 03/16/2022  ? BASOSABS 0.1 03/16/2022  ? ? ? ?Last metabolic panel ?Lab Results  ?Component Value Date  ? NA 129 (L) 03/20/2022  ? K 4.1 03/20/2022  ? CL 95 (L) 03/20/2022  ? CO2 20 (L) 03/20/2022  ? BUN 109 (H) 03/20/2022  ? CREATININE 7.16 (H) 03/20/2022  ? GLUCOSE 132 (H) 03/20/2022  ? GFRNONAA 8 (L) 03/20/2022  ? GFRAA >60 09/14/2019  ? CALCIUM 8.4 (L) 03/20/2022  ? PHOS 6.5 (H) 03/20/2022  ? PROT 6.8 03/14/2022  ? ALBUMIN 2.1 (L) 03/20/2022  ? BILITOT 1.7 (H) 03/20/2022  ? ALKPHOS 165 (H) 03/14/2022  ? AST 41 03/14/2022  ? ALT 29 03/14/2022  ? ANIONGAP 14 03/20/2022  ? ? ?GFR: ?Estimated Creatinine Clearance: 14 mL/min (A) (by  C-G formula based on SCr of 7.16 mg/dL (H)). ? ?Recent Results (from the past 240 hour(s))  ?Culture, blood (Routine X 2) w Reflex to ID Panel     Status: None  ?  Collection Time: 03/10/22  6:22 PM  ? Specimen: BLOOD  ?Result Value Ref Range Status  ? Specimen Description BLOOD RIGHT ANTECUBITAL  Final  ? Special Requests   Final  ?  BOTTLES DRAWN AEROBIC ONLY Blood Culture adequate volume  ? Culture   Final  ?

## 2022-03-20 NOTE — Progress Notes (Signed)
?  X-cover Note: ?Notified by bedside RN that pt pulled out his cortrak. Pt on continuous tube feeds. Will start D5 0.9% NS @ 50 ml/hr.  ? ?He will need cortrak replaced tomorrow. ? ? ?Kristopher Oppenheim, DO ?[facility] ? ?

## 2022-03-21 DIAGNOSIS — I96 Gangrene, not elsewhere classified: Secondary | ICD-10-CM | POA: Diagnosis not present

## 2022-03-21 DIAGNOSIS — R4182 Altered mental status, unspecified: Secondary | ICD-10-CM | POA: Diagnosis not present

## 2022-03-21 DIAGNOSIS — N179 Acute kidney failure, unspecified: Secondary | ICD-10-CM | POA: Diagnosis not present

## 2022-03-21 DIAGNOSIS — A419 Sepsis, unspecified organism: Secondary | ICD-10-CM | POA: Diagnosis not present

## 2022-03-21 LAB — GLUCOSE, CAPILLARY
Glucose-Capillary: 107 mg/dL — ABNORMAL HIGH (ref 70–99)
Glucose-Capillary: 108 mg/dL — ABNORMAL HIGH (ref 70–99)
Glucose-Capillary: 115 mg/dL — ABNORMAL HIGH (ref 70–99)
Glucose-Capillary: 115 mg/dL — ABNORMAL HIGH (ref 70–99)
Glucose-Capillary: 117 mg/dL — ABNORMAL HIGH (ref 70–99)
Glucose-Capillary: 123 mg/dL — ABNORMAL HIGH (ref 70–99)
Glucose-Capillary: 99 mg/dL (ref 70–99)

## 2022-03-21 LAB — CBC
HCT: 22.4 % — ABNORMAL LOW (ref 39.0–52.0)
Hemoglobin: 7.1 g/dL — ABNORMAL LOW (ref 13.0–17.0)
MCH: 29 pg (ref 26.0–34.0)
MCHC: 31.7 g/dL (ref 30.0–36.0)
MCV: 91.4 fL (ref 80.0–100.0)
Platelets: 223 10*3/uL (ref 150–400)
RBC: 2.45 MIL/uL — ABNORMAL LOW (ref 4.22–5.81)
RDW: 15.2 % (ref 11.5–15.5)
WBC: 7.6 10*3/uL (ref 4.0–10.5)
nRBC: 0 % (ref 0.0–0.2)

## 2022-03-21 LAB — RENAL FUNCTION PANEL
Albumin: 2.1 g/dL — ABNORMAL LOW (ref 3.5–5.0)
Anion gap: 11 (ref 5–15)
BUN: 56 mg/dL — ABNORMAL HIGH (ref 8–23)
CO2: 24 mmol/L (ref 22–32)
Calcium: 8.3 mg/dL — ABNORMAL LOW (ref 8.9–10.3)
Chloride: 98 mmol/L (ref 98–111)
Creatinine, Ser: 4.63 mg/dL — ABNORMAL HIGH (ref 0.61–1.24)
GFR, Estimated: 14 mL/min — ABNORMAL LOW (ref >60)
Glucose, Bld: 108 mg/dL — ABNORMAL HIGH (ref 70–99)
Phosphorus: 4.9 mg/dL — ABNORMAL HIGH (ref 2.5–4.6)
Potassium: 3.4 mmol/L — ABNORMAL LOW (ref 3.5–5.1)
Sodium: 133 mmol/L — ABNORMAL LOW (ref 135–145)

## 2022-03-21 LAB — AEROBIC/ANAEROBIC CULTURE W GRAM STAIN (SURGICAL/DEEP WOUND): Culture: NO GROWTH

## 2022-03-21 MED ORDER — DEXTROSE 5 % IV SOLN
0.9400 g | INTRAVENOUS | Status: DC
  Administered 2022-03-21 – 2022-03-22 (×2): 0.94 g via INTRAVENOUS
  Filled 2022-03-21 (×3): qty 4.51

## 2022-03-21 MED ORDER — METRONIDAZOLE 500 MG/100ML IV SOLN
500.0000 mg | Freq: Two times a day (BID) | INTRAVENOUS | Status: DC
  Administered 2022-03-21 – 2022-03-22 (×4): 500 mg via INTRAVENOUS
  Filled 2022-03-21 (×4): qty 100

## 2022-03-21 MED ORDER — METRONIDAZOLE 500 MG/100ML IV SOLN: 500.0000 mg | Freq: Two times a day (BID) | INTRAVENOUS | Status: DC

## 2022-03-21 MED ORDER — SODIUM CHLORIDE 0.9 % IV SOLN
2.0000 g | INTRAVENOUS | Status: DC
  Administered 2022-03-21 – 2022-03-22 (×2): 2 g via INTRAVENOUS
  Filled 2022-03-21 (×2): qty 2000

## 2022-03-21 NOTE — Progress Notes (Signed)
Speech Language Pathology Treatment: Dysphagia  ?Patient Details ?Name: Jeffrey Ayala ?MRN: [id number] ?DOB: 1961/04/29 ?Today's Date: 03/21/2022 ?Time: 0932-3557 ?SLP Time Calculation (min) (ACUTE ONLY): 21 min ? ?Assessment / Plan / Recommendation ?Clinical Impression ? Pt is much more alert today with oral preparation a little slowed but otherwise presenting with functional appearing oropharyngeal swallow. No overt s/s of aspiration are noted and pt tends to take small, single bites/sips even when cued to do more to try to challenge him. Suspect that his underlying swallow function remains adequate but that his risk for aspiration and inadequate intake is impacted largely by fluctuating mentation. Wife is present at bedside and acknowledges mentation and its potential impact on PO intake. They are in agreement with starting Dys 3 diet and thin liquids, meds crushed in puree. Note that Cortrak was dislodged last night, so hopefully his mentation will at least allow for him to have consistent access to medications through the weekend until the team is back on Monday. Encouraged wife to continue to take breaks from PO intake if not alert enough, but to encourage intake when alert and attentive. Will continue to follow. ?  ?HPI HPI: 61 y.o. male presented to the [facility] ED with a chief complaint of nausea, vomiting, diarrhea. Dx septic shock (sources could be scrotal cellulitis versus right lower lobe aspiration pneumonia), afib, acute hypoxemic resp failure requiring intubation 4/3-4/10, AKI from septic shock, metabolic encephalopathy. BSE 4/10 full liquids (pt choice), BSE 4/17 recommended regular texture, thin liquids, On 4/23 pt underwent amputation of left transmetatarsal then 4/24 underwent debridement of scrotum. ?  ?   ?SLP Plan ? Continue with current plan of care ? ?  ?  ?Recommendations for follow up therapy are one component of a multi-disciplinary discharge planning process, led by the attending physician.   Recommendations may be updated based on patient status, additional functional criteria and insurance authorization. ?  ? ?Recommendations  ?Diet recommendations: Dysphagia 3 (mechanical soft);Thin liquid ?Liquids provided via: Cup;Straw ?Medication Administration: Crushed with puree ?Supervision: Staff to assist with self feeding;Full supervision/cueing for compensatory strategies;Trained caregiver to feed patient ?Compensations: Minimize environmental distractions;Slow rate;Small sips/bites ?Postural Changes and/or Swallow Maneuvers: Seated upright 90 degrees  ?   ?    ?   ? ? ? ? Oral Care Recommendations: Oral care BID ?Follow Up Recommendations: Acute inpatient rehab (3hours/day) ?Assistance recommended at discharge: Frequent or constant Supervision/Assistance ?SLP Visit Diagnosis: Dysphagia, unspecified (R13.10) ?Plan: Continue with current plan of care ? ? ? ? ?  ?  ? ? ?Osie Bond., M.A. CCC-SLP ?Acute Rehabilitation Services ?Office [phone] ? ?Secure chat preferred ? ? ?03/21/2022, 9:23 AM ?

## 2022-03-21 NOTE — Consult Note (Signed)
[facility]         Reason for Consult: polymicrobial gangrenous soft tissue infection of groin/GU   Referring Physician: nettey  Principal Problem:   Septic shock ([name]) Active Problems:   Atrial fibrillation with RVR ([name])   Pressure injury of skin   Encounter for central line placement   Severe protein-calorie malnutrition ([name])   Altered mental status   Gangrene of right foot ([name])   Gangrene of left foot ([name])   Malnutrition of moderate degree   Hypotension   Respiratory insufficiency   Cellulitis    HPI: Jeffrey Ayala is a 61 y.o. male with afibk arthritis with hx of bilateral knee replacements, eosinophilic esophagitis admitted for sepsis with afib on 4/2 requiring pressors, intubation c/b cardiac arrest post intubation. He required CRRT temporarily and subseqently extubated. Course complicated to pressor induced ischemia to lower extremities. His source of infection thought to be SSTI with possible fournier's/gangrenous scrotum. He had ischemic/gangrenous changes to right and left foot and underwent L TMA and R BKA on 4/23, and dedridement of scrotum on 4/24 with OR cx growing MDRO PsA, bacteroides, and e.faecalis. His blood cx remain negative. Most recently on vancomycin and cefepime. ID asked to weigh in on abtx management. He is afebrile but suffers from day/night reversal. Wife reports occasionally confused and other times appropriately answering questions.   Past Medical History:  Diagnosis Date   Concussion    Eosinophilic esophagitis    Gastric ulcer    History of kidney stones    Migraine     Allergies:  Allergies  Allergen Reactions   Codeine Hives    Insomnia, jittery   Flomax [Tamsulosin Hcl] Nausea Only    Nausea and fatigue     MEDICATIONS:  sodium chloride   Intravenous Once   acetaminophen (TYLENOL) oral liquid 160 mg/5 mL  650 mg Per Tube Q6H   allopurinol  100 mg Per Tube Daily   vitamin C  1,000 mg Per Tube  Daily   aspirin  81 mg Per Tube Daily   Chlorhexidine Gluconate Cloth  6 each Topical Q0600   [START ON 03/23/2022] darbepoetin (ARANESP) injection - DIALYSIS  200 mcg Intravenous Q Mon-HD   feeding supplement (PROSource TF)  45 mL Per Tube QID   fiber  1 packet Per Tube BID   folic acid  1 mg Per Tube Daily   guaiFENesin  400 mg Per Tube Q8H   heparin injection (subcutaneous)  5,000 Units Subcutaneous Q8H   insulin aspart  0-15 Units Subcutaneous Q4H   leptospermum manuka honey  1 application. Topical Daily   melatonin  3 mg Per Tube QHS   metoprolol tartrate  37.5 mg Per Tube TID   multivitamin  1 tablet Per Tube QHS   nutrition supplement (JUVEN)  1 packet Per Tube BID BM   oxyCODONE  5 mg Per Tube Once   pantoprazole sodium  40 mg Per Tube Daily   QUEtiapine  25 mg Per Tube Daily   QUEtiapine  50 mg Per Tube QHS   sevelamer carbonate  2.4 g Per Tube Q8H   sodium chloride flush  3 mL Intravenous Q12H   sodium hypochlorite   Irrigation BID   zinc sulfate  220 mg Per Tube Daily    Social History   Tobacco Use   Smoking status: Never   Smokeless tobacco: Never  Vaping Use   Vaping Use: Never used  Substance Use Topics  Alcohol use: Yes    Comment: occassional   Drug use: No    Family History  Problem Relation Age of Onset   COPD Mother    Sarcoidosis Brother    Cancer Brother    Stomach cancer Neg Hx    Pancreatic cancer Neg Hx    Esophageal cancer Neg Hx    Colon cancer Neg Hx    Rectal cancer Neg Hx     Review of Systems - unable to obtain due to encephalopathy   OBJECTIVE: Temp:  [97.5 F (36.4 C)-99 F (37.2 C)] 98.7 F (37.1 C) (04/29 0834) Pulse Rate:  [94-111] 111 (04/29 0834) Resp:  [17-24] 22 (04/29 0834) BP: (121-139)/(62-90) 124/68 (04/29 0834) SpO2:  [94 %-100 %] 100 % (04/29 0834) Weight:  [119.1 kg-142.4 kg] 142.4 kg (04/29 0500) Physical Exam  Constitutional: He is oriented to person, only. He appears well-developed and well-nourished. No  distress.  HENT:  Mouth/Throat: Oropharynx is clear and moist. No oropharyngeal exudate.  Cardiovascular: Normal rate, regular rhythm and normal heart sounds. Exam reveals no gallop and no friction rub.  No murmur heard.  Pulmonary/Chest: Effort normal and breath sounds normal. No respiratory distress. He has no wheezes.  Abdominal: Soft. Bowel sounds are normal. He exhibits no distension. There is no tenderness. Scrotum is wrapped. Foley in place, and rectal tube in place Ext: left bka. And right TMA. Right superior ulcer along leftTKA incision. Lymphadenopathy:  He has no cervical adenopathy.  Neurological: He is alert and oriented to person, place, and time.  Skin: Skin is warm and dry. No rash noted. No erythema. Right forearm eschar Psychiatric: He has a normal mood and affect. His behavior is normal.    LABS: Results for orders placed or performed during the hospital encounter of 02/22/22 (from the past 48 hour(s))  Glucose, capillary     Status: Abnormal   Collection Time: 03/19/22 11:29 AM  Result Value Ref Range   Glucose-Capillary 137 (H) 70 - 99 mg/dL    Comment: Glucose reference range applies only to samples taken after fasting for at least 8 hours.  Vitamin B12     Status: None   Collection Time: 03/19/22  1:49 PM  Result Value Ref Range   Vitamin B-12 879 180 - 914 pg/mL    Comment: (NOTE) This assay is not validated for testing neonatal or myeloproliferative syndrome specimens for Vitamin B12 levels. Performed at [facility], [address] [place], [place] [address]   Vitamin B1     Status: None   Collection Time: 03/19/22  1:49 PM  Result Value Ref Range   Vitamin B1 (Thiamine) 112.8 66.5 - 200.0 nmol/L    Comment: (NOTE) This test was developed and its performance characteristics determined by [facility]. It has not been cleared or approved by the Food and Drug Administration. Performed At: [facility] [address] [place], [place]  [id number] Jolene Schimke MD AO:[id number]   Glucose, capillary     Status: Abnormal   Collection Time: 03/19/22  4:07 PM  Result Value Ref Range   Glucose-Capillary 125 (H) 70 - 99 mg/dL    Comment: Glucose reference range applies only to samples taken after fasting for at least 8 hours.  Glucose, capillary     Status: None   Collection Time: 03/19/22  9:13 PM  Result Value Ref Range   Glucose-Capillary 88 70 - 99 mg/dL    Comment: Glucose reference range applies only to samples taken after fasting  for at least 8 hours.   Comment 1 Notify RN    Comment 2 Document in Chart   Glucose, capillary     Status: Abnormal   Collection Time: 03/20/22 12:05 AM  Result Value Ref Range   Glucose-Capillary 115 (H) 70 - 99 mg/dL    Comment: Glucose reference range applies only to samples taken after fasting for at least 8 hours.   Comment 1 Notify RN    Comment 2 Document in Chart   Renal function panel (daily at 0500)     Status: Abnormal   Collection Time: 03/20/22  3:22 AM  Result Value Ref Range   Sodium 129 (L) 135 - 145 mmol/L   Potassium 4.1 3.5 - 5.1 mmol/L   Chloride 95 (L) 98 - 111 mmol/L   CO2 20 (L) 22 - 32 mmol/L   Glucose, Bld 132 (H) 70 - 99 mg/dL    Comment: Glucose reference range applies only to samples taken after fasting for at least 8 hours.   BUN 109 (H) 8 - 23 mg/dL   Creatinine, Ser 8.11 (H) 0.61 - 1.24 mg/dL   Calcium 8.4 (L) 8.9 - 10.3 mg/dL   Phosphorus 6.5 (H) 2.5 - 4.6 mg/dL   Albumin 2.1 (L) 3.5 - 5.0 g/dL   GFR, Estimated 8 (L) >60 mL/min    Comment: (NOTE) Calculated using the CKD-EPI Creatinine Equation (2021)    Anion gap 14 5 - 15    Comment: Performed at [facility], [address] [place], [place] [address]  CBC     Status: Abnormal   Collection Time: 03/20/22  3:22 AM  Result Value Ref Range   WBC 8.4 4.0 - 10.5 K/uL   RBC 2.67 (L) 4.22 - 5.81 MIL/uL   Hemoglobin 7.7 (L) 13.0 - 17.0 g/dL   HCT 29.5 (L) 62.1 - 30.8 %   MCV 92.5 80.0 -  100.0 fL   MCH 28.8 26.0 - 34.0 pg   MCHC 31.2 30.0 - 36.0 g/dL   RDW 65.7 84.6 - 96.2 %   Platelets 225 150 - 400 K/uL   nRBC 0.0 0.0 - 0.2 %    Comment: Performed at [facility], [address], [place], [place] [address]  Glucose, capillary     Status: Abnormal   Collection Time: 03/20/22  4:24 AM  Result Value Ref Range   Glucose-Capillary 124 (H) 70 - 99 mg/dL    Comment: Glucose reference range applies only to samples taken after fasting for at least 8 hours.   Comment 1 Notify RN    Comment 2 Document in Chart   Bilirubin, fractionated(tot/dir/indir)     Status: Abnormal   Collection Time: 03/20/22  7:35 AM  Result Value Ref Range   Total Bilirubin 1.7 (H) 0.3 - 1.2 mg/dL   Bilirubin, Direct 0.7 (H) 0.0 - 0.2 mg/dL   Indirect Bilirubin 1.0 (H) 0.3 - 0.9 mg/dL    Comment: Performed at [facility], [address], [place], [place] [address]  Lactate dehydrogenase     Status: Abnormal   Collection Time: 03/20/22  7:35 AM  Result Value Ref Range   LDH 256 (H) 98 - 192 U/L    Comment: Performed at [facility], [address] [place], [place] [address]  Glucose, capillary     Status: Abnormal   Collection Time: 03/20/22  7:35 AM  Result Value Ref Range   Glucose-Capillary 104 (H) 70 - 99  mg/dL    Comment: Glucose reference range applies only to samples taken after fasting for at least 8 hours.  Glucose, capillary     Status: Abnormal   Collection Time: 03/20/22  1:18 PM  Result Value Ref Range   Glucose-Capillary 128 (H) 70 - 99 mg/dL    Comment: Glucose reference range applies only to samples taken after fasting for at least 8 hours.  Glucose, capillary     Status: Abnormal   Collection Time: 03/20/22  4:38 PM  Result Value Ref Range   Glucose-Capillary 108 (H) 70 - 99 mg/dL    Comment: Glucose reference range applies only to samples taken after fasting for at least 8 hours.  Glucose, capillary     Status: Abnormal   Collection Time: 03/20/22  8:29 PM   Result Value Ref Range   Glucose-Capillary 135 (H) 70 - 99 mg/dL    Comment: Glucose reference range applies only to samples taken after fasting for at least 8 hours.   Comment 1 Notify RN    Comment 2 Document in Chart   Glucose, capillary     Status: Abnormal   Collection Time: 03/21/22 12:29 AM  Result Value Ref Range   Glucose-Capillary 108 (H) 70 - 99 mg/dL    Comment: Glucose reference range applies only to samples taken after fasting for at least 8 hours.   Comment 1 Notify RN    Comment 2 Document in Chart   Renal function panel (daily at 0500)     Status: Abnormal   Collection Time: 03/21/22  1:19 AM  Result Value Ref Range   Sodium 133 (L) 135 - 145 mmol/L   Potassium 3.4 (L) 3.5 - 5.1 mmol/L    Comment: DELTA CHECK NOTED   Chloride 98 98 - 111 mmol/L   CO2 24 22 - 32 mmol/L   Glucose, Bld 108 (H) 70 - 99 mg/dL    Comment: Glucose reference range applies only to samples taken after fasting for at least 8 hours.   BUN 56 (H) 8 - 23 mg/dL   Creatinine, Ser 7.37 (H) 0.61 - 1.24 mg/dL    Comment: DELTA CHECK NOTED   Calcium 8.3 (L) 8.9 - 10.3 mg/dL   Phosphorus 4.9 (H) 2.5 - 4.6 mg/dL   Albumin 2.1 (L) 3.5 - 5.0 g/dL   GFR, Estimated 14 (L) >60 mL/min    Comment: (NOTE) Calculated using the CKD-EPI Creatinine Equation (2021)    Anion gap 11 5 - 15    Comment: Performed at [facility], [address], [place], [place] [address]  CBC     Status: Abnormal   Collection Time: 03/21/22  1:19 AM  Result Value Ref Range   WBC 7.6 4.0 - 10.5 K/uL   RBC 2.45 (L) 4.22 - 5.81 MIL/uL   Hemoglobin 7.1 (L) 13.0 - 17.0 g/dL   HCT 94.8 (L) 54.6 - 27.0 %   MCV 91.4 80.0 - 100.0 fL   MCH 29.0 26.0 - 34.0 pg   MCHC 31.7 30.0 - 36.0 g/dL   RDW 35.0 09.3 - 81.8 %   Platelets 223 150 - 400 K/uL   nRBC 0.0 0.0 - 0.2 %    Comment: Performed at [facility], [address] [place], [place] [address]  Glucose, capillary     Status: Abnormal   Collection Time: 03/21/22   4:19 AM  Result Value Ref Range   Glucose-Capillary 115 (H) 70 - 99 mg/dL  Comment: Glucose reference range applies only to samples taken after fasting for at least 8 hours.   Comment 1 Notify RN    Comment 2 Document in Chart   Glucose, capillary     Status: Abnormal   Collection Time: 03/21/22  8:39 AM  Result Value Ref Range   Glucose-Capillary 115 (H) 70 - 99 mg/dL    Comment: Glucose reference range applies only to samples taken after fasting for at least 8 hours.    MICRO: reviewed IMAGING: No results found.  HISTORICAL MICRO/IMAGING  Assessment/Plan:  polymicrobial skin/soft tissue infection of scrotum with gangrenous changes s/p I x D on 4/25. Cx growing MDRO PsA, e.faecalis and bacteroides -  Will recommend to change to ampicillin (efaecalis), ceftaz-avibactam(MDRO Psa) and metronidazole (bacteroides) this should provide sufficient coverage for polymicrobial infection. - possibly had smoldering infection on admit that was the source of infection. PsA strain is R to cefepime, thus was not adequately treated until he had I x D and now changes in his abtx.  - will likely need prolonged abtx for 2 wk, continue to monitor with wound care and may need repeat I x D or at least evaluation if needs further debridement.

## 2022-03-21 NOTE — Progress Notes (Signed)
Patient ID: Jeffrey Ayala, male   DOB: [birth year], 61 y.o.   MRN: [id number] ? ?5 Days Post-Op ?Subjective: ?Wound care changed to Dakin's solution yesterday. No acute events ? ?Objective: ?Vital signs in last 24 hours: ?Temp:  [97.5 ?F (36.4 ?C)-99 ?F (37.2 ?C)] 98.6 ?F (37 ?C) (04/29 0026) ?Pulse Rate:  [78-111] 98 (04/29 0026) ?Resp:  [14-24] 20 (04/29 0026) ?BP: (121-139)/(62-90) 139/76 (04/29 0026) ?SpO2:  [94 %-99 %] 94 % (04/29 0026) ?Weight:  [119.1 kg-142.4 kg] 142.4 kg (04/29 0500) ? ?Intake/Output from previous day: ?04/28 0701 - 04/29 0700 ?In: 2278.8 [I.V.:503.8; NG/GT:1375; IV Piggyback:400] ?Out: 2400 [Urine:200; Stool:300] ?Intake/Output this shift: ?No intake/output data recorded. ? ?Physical Exam:  ?GU: exam appears similar to Dr Lynne Logan yesteray. There is some fibrinous exudate on the upper/inner portion of the scrotum. Testicles appear viable. no fluctuance, crepitus, increased erythema. ? ?Lab Results: ?Recent Labs  ?  03/19/22 ?0154 03/20/22 ?0322 03/21/22 ?0119  ?HGB 8.0* 7.7* 7.1*  ?HCT 25.1* 24.7* 22.4*  ? ? ? ?  Latest Ref Rng & Units 03/21/2022  ?  1:19 AM 03/20/2022  ?  3:22 AM 03/19/2022  ?  1:54 AM  ?CBC  ?WBC 4.0 - 10.5 K/uL 7.6   8.4   9.5    ?Hemoglobin 13.0 - 17.0 g/dL 7.1   7.7   8.0    ?Hematocrit 39.0 - 52.0 % 22.4   24.7   25.1    ?Platelets 150 - 400 K/uL 223   225   235    ? ? ? ?BMET ?Recent Labs  ?  03/20/22 ?0322 03/21/22 ?0119  ?NA 129* 133*  ?K 4.1 3.4*  ?CL 95* 98  ?CO2 20* 24  ?GLUCOSE 132* 108*  ?BUN 109* 56*  ?CREATININE 7.16* 4.63*  ?CALCIUM 8.4* 8.3*  ? ? ? ? ?Studies/Results: ?Tissue cultures with Enterococcus and few Pseudomonas  ? ?Assessment/Plan: ?1) Scrotal wound/infection: S/P debridement on 4/24.  ? ?I changed out his wound packing this morning. Continue BID packing changes.  ? ?Would be best to avoid additional anesthetic considering his overall situation and the fact that he required prolonged intubation earlier this week.   ? ? LOS: 27 days  ? ?Nora Rooke L  Jessenya Berdan ?03/21/2022, 7:02 AM ? ?  ?

## 2022-03-21 NOTE — Progress Notes (Addendum)
Pharmacy Antibiotic Note ? ?Jeffrey Ayala is a 61 y.o. male admitted on 02/22/2022 with polymicrobial gangrenous  SSTI of groin/scrotum .  Pharmacy has been consulted for ampicillin, ceftazidime-avibactam, and metronidazole dosing. ? ?Plan: ?Discontinue vancomycin and cefepime. ?Start ampicillin 2g IV q24h at 20:00 on 4/29 (give after HD on HD days) ?Start ceftazidime-avibactam 0.94g IV q24h at 13:00 on 4/29 (give after HD on HD days)  ?Start metronidazole '500mg'$  IV q12h  ?Continue to follow plans for HD and adjust antibiotic timing as appropriate.  ?Continue to monitor CBC, temp, and clinical s/sx of infection daily. ? ?Height: '5\' 10"'$  (177.8 cm) ?Weight: (!) 142.4 kg (314 lb) ?IBW/kg (Calculated) : 73 ? ?Temp (24hrs), Avg:98.5 ?F (36.9 ?C), Min:97.5 ?F (36.4 ?C), Max:99 ?F (37.2 ?C) ? ?Recent Labs  ?Lab 03/14/22 ?1205 03/15/22 ?0705 03/16/22 ?1200 03/16/22 ?1343 03/17/22 ?0754 03/18/22 ?0602 03/18/22 ?1435 03/19/22 ?0154 03/20/22 ?0322 03/21/22 ?0119  ?WBC  --    < >  --   --   --  7.3 8.3 9.5 8.4 7.6  ?CREATININE  --    < >  --    < > 6.55* 8.42*  --  5.35* 7.16* 4.63*  ?VANCORANDOM 38  --  29  --   --   --   --   --   --   --   ? < > = values in this interval not displayed.  ?  ?Estimated Creatinine Clearance: 23.9 mL/min (A) (by C-G formula based on SCr of 4.63 mg/dL (H)).   ? ?Allergies  ?Allergen Reactions  ? Codeine Hives  ?  Insomnia, jittery  ? Flomax [Tamsulosin Hcl] Nausea Only  ?  Nausea and fatigue  ? ? ?Antimicrobials this admission: ?4/2 flagyl x 1 ?4/2 vanc >> 4/5; 4/18>>4/29  ?4/2 cefepime >> 4/5; 4/18>>4/29  ?4/5 linezolid >> 4/7 ?4/5 pip/tazo >> 4/14 ? ?4/29 ampicillin >> ?4/29 ceftaz-avi >>  ?4/29 flagyl >>  ? ?Microbiology results: ?4/2 Urine: multiple species ?4/2 Blood: neg ?4/6 trach aspirate: rare Candida albicans ?4/2 C diff: neg ?4/2 MRSA PCR: neg ?4/2 COVID and flu: neg ?4/6 Trach asp- rare GPC, rare GVR, rare candida ?4/17 bcx: ngtd  ?4/18 Bcx ngtd ?4/22 MRSA PCR neg, SA + ?4/24 scrotal  cx: E. Faecalis (s: vanc/amp) and MDR PsA (R: cefepime, cipro, imipenem, I: ceftaz, gent) ? ?Thank you for allowing pharmacy to be a part of this patient?s care. ? ?Kaleen Mask ?03/21/2022 11:23 AM ? ?

## 2022-03-21 NOTE — Progress Notes (Signed)
? ?PROGRESS NOTE ? ? ? ?Jeffrey Ayala  WUJ:[id number] DOB: 12-10-1960 [name]: 02/22/2022 ?PCP: Patient, No Pcp Per (Inactive) ? ? ?Brief Narrative: ?Jeffrey Ayala is a 61 y.o. male with PMH significant for arthritis, bilateral knee replacement, gastric ulcer, kidney stones, eosinophilic esophagitis, migraines not on any regular meds.   ?Patient presented to the ED on 4/2 with complaint of nausea, vomiting, diarrhea and confusion for 24 hours.   ?EMS noted him to be in A-fib with a heart rate of 120-160 bpm. ?In the ED, he had a fever of 102.9. ?CT abdomen pelvis showed swollen scrotum concerning for infection, colon filled with diarrhea.  ?He was hypotensive requiring multiple vasopressors in the ICU.   ?4/3, patient required intubation and also had a brief cardiac arrest postintubation.   ?4/5, in an effort to correct vasoplegia, patient was also started on [name] ?Because of septic shock, patient's renal function worsened. ?4/6, patient was started on CRRT ?4/11, patient was extubated. ?4/14, patient was transferred out to [facility] service. ?4/23, underwent left transmetatarsal amputation and right BKA by [name] ?4/24, scrotum debridement and subsequent ICU readmission ?4/27, transfer to [facility] service ?4/29, scrotal wound culture with E. Faecalis and MDR Pseudomonas aeruginosa ? ?Assessment and Plan: ? ?Septic shock ?Present on admission. Secondary to scrotal cellulitis. Patient required ICU admission with vasopressor support. Patient's underlying infection was managed with antibiotics with improvement of shock. Now resolved. ? ?Scrotal cellulitis ?Urology consulted and performed debridement of scrotum on 4/24. Wound cultures significant for enterococcus and pseudomonas with final results pending. Patient is currently managed on Vancomycin and Cefepime. Pseudomonas sensitivities resulted in MDR strain ?-Continue Vancomycin and Cefepime ?-ID consult for MDR pseudomonas ? ?Dry gangrene of bilateral  feet ?Patient is s/p left TMA and right BKA. Wound vac on left TMA wound ? ?AKI ?Anuric and now dialysis dependant. Patient was managed on CRRT from 4/6 until 4/11 and has transitioned to intermittent hemodialysis per nephrology service. ?-Nephrology recommendations: continuing intermittent HD ? ?Paroxysmal atrial fibrillation ?New diagnosis. CHA2DS2-VASc Score is 1.Transthoracic Echocardiogram with preserved LVEF. Mostly rate controlled but did have some associated tachycardia while agitated. Initially was managed on heparin IV which was discontinued secondary to anemia. Cardiology consulted. Patient started on metoprolol 37.5 mg TID and aspirin 81 mg.  ?-Continue metoprolol 37.5 mg TID and aspirin 81 mg daily ? ?Acute metabolic encephalopathy ?Delirium ?Unsure of specific etiology. Possibly secondary to critical illness but also complicated by uremia and concern for possible hypoxemic injury while in septic shock. CT head unremarkable for acute process. Patient started on Seroquel for hopeful management with not much improvement. Patient with waxing and waning symptoms. Normal vitamin B12. ?-Follow-up Vitamin B1 ?-Delirium precautions ?-Continue Seroquel BID ?-Haldol PRN ? ?Acute anemia ?Baseline hemoglobin of 13 prior to admission. Patient with downward drifting hemoglobin without evidence of active bleeding. Possible combination of chronic disease and perioperative blood loss. Patient has received 4 units of pRBC to date. ? ?Diarrhea ?Persistent issue. GI panel negative. Patient is on tube feeds which may be contributing. Rectal tube placed. Seems to be improving. ? ?Shock liver ?Secondary to sepsis. AST/ALT elevations resolved. Bilirubin improved. ? ?Acute respiratory failure with hypoxia ?Patient intubated from 4/3 - 4/10, 4/23 - 4/23, 4/24 - 4/25 ? ?Acute gout ?Bilateral elbows and left hand. Patient started on allopurinol and colchicine. Prednisone avoided secondary to concern for impaired wound healing.  Colchicine discontinued. ?-Continue allopurinol ? ?Acute pain ?-Switch to oxycodone PRN ?-Tylenol scheduled ? ?Hyponatremia ?Metabolic acidosis ?  Hyperphosphatemia ?Management per nephrology ? ?Demand ischemia ?Significant troponin elevation trending up to 3,550 before checks were ceased. In setting of septic shock and cardiac arrest. Transthoracic Echocardiogram without WMA and with normal LVEF. Per cardiology, likely demand ischemia. ? ?Thrombocytopenia ?Likely secondary to septic shock. Patient received a total of 1 unit of platelets. Thrombocytopenia now resolved. ? ?Poor oral intake ?Moderate malnutrition ?In setting of altered mentation. Dietitian consulted. Small bore NG tube placed on 4/17 and tube feeds initiated. Unfortunately, NG tube mistakenly removed on 4/28. SLP reevaluated on 4/29 with recommendation for dysphagia 3 diet. ?-Hold tube feeds ?-SLP recommendations (4/29): ? Diet recommendations: Dysphagia 3 (mechanical soft);Thin liquid ?Liquids provided via: Cup;Straw ?Medication Administration: Crushed with puree ?Supervision: Staff to assist with self feeding;Full supervision/cueing for compensatory strategies;Trained caregiver to feed patient ?Compensations: Minimize environmental distractions;Slow rate;Small sips/bites ?Postural Changes and/or Swallow Maneuvers: Seated upright 90 degrees  ? ?-Dietitian recommendations (4/26): ?Continue tube feeds via Cortrak tube: ?Nepro @ 50 ml/hr (1200 ml/day) ?ProSource TF 45 ml QID ?Tube feeding regimen provides 2320 kcal, 141 grams of protein, and 872 ml of H2O. ?Continue renal MVI daily per tube ?Continue 1 packet Juven BID per tube, each packet provides 95 calories, 2.5 grams of protein, and 9.8 grams of carbohydrate; also contains 7 grams of L-arginine and L-glutamine, 300 mg vitamin C, 15 mg vitamin E, 1.2 mcg vitamin B-12, 9.5 mg zinc, 200 mg calcium, and 1.5 g  Calcium Beta-hydroxy-Beta-methylbutyrate to support wound healing ?RD will monitor for diet  advancement and add oral nutrition supplements as appropriate ? ?Obesity ?Body mass index is 45.05 kg/m?. ? ?Pressure injury ?Medial coccyx. POA. ? ? ?DVT prophylaxis: Heparin subq ?Code Status:   Code Status: Full Code ?Family Communication: Wife at bedside ?Disposition Plan: Discharge pending improvement of mental status and advancement of diet in addition to antibiotic regimen/urology management. ? ? ?Consultants:  ?PCCM ?Urology ?Orthopedic surgery ?Vascular surgery ?Cardiology ?Nephrology ?Infectious disease ? ?Procedures:  ?LEFT TRANSMETATARSAL AMPUTATION, RIGHT BELOW KNEE AMPUTATION (4/23) ?Debridement of scrotum (4/24) ? ?Antimicrobials: ?Vancomycin ?Cefepime ?Cefazolin ?Zosyn ?Linezolid ? ? ?Subjective: ?Pulled out Cortrak last night accidentally. Some continued agitation but not as significant, it seems. ? ?Objective: ?BP 124/68 (BP Location: Right Arm)   Pulse (!) 111   Temp 98.7 ?F (37.1 ?C) (Oral)   Resp (!) 22   Ht '5\' 10"'$  (1.778 m)   Wt (!) 142.4 kg   SpO2 100%   BMI 45.05 kg/m?  ? ?Examination: ? ?General exam: Appears calm and comfortable ?Respiratory system: Clear to auscultation anteriorly. Respiratory effort normal. ?Cardiovascular system: S1 & S2 heard, RRR. ?Gastrointestinal system: Abdomen is nondistended, soft and nontender. No organomegaly or masses felt. Normal bowel sounds heard. ?Central nervous system: Alert and oriented to self. ?Musculoskeletal:  No calf tenderness. Right TMA and left BKA. Wound vac on TMA wound ?Skin: No cyanosis. No rashes ? ? ?Data Reviewed: I have personally reviewed following labs and imaging studies ? ?CBC ?Lab Results  ?Component Value Date  ? WBC 7.6 03/21/2022  ? RBC 2.45 (L) 03/21/2022  ? HGB 7.1 (L) 03/21/2022  ? HCT 22.4 (L) 03/21/2022  ? MCV 91.4 03/21/2022  ? MCH 29.0 03/21/2022  ? PLT 223 03/21/2022  ? MCHC 31.7 03/21/2022  ? RDW 15.2 03/21/2022  ? LYMPHSABS 0.9 03/16/2022  ? MONOABS 0.9 03/16/2022  ? EOSABS 0.0 03/16/2022  ? BASOSABS 0.1  03/16/2022  ? ? ? ?Last metabolic panel ?Lab Results  ?Component Value Date  ? NA 133 (L) 03/21/2022  ?  K 3.4 (L) 03/21/2022  ? CL 98 03/21/2022  ? CO2 24 03/21/2022  ? BUN 56 (H) 03/21/2022  ? CREATININE 4.63 (H) 03/21/2022  ?

## 2022-03-21 NOTE — Progress Notes (Addendum)
?[facility] ?Progress Note  ? ? ?Assessment/ Plan:   ?Anuric dialysis dependent AKI: in the setting of severe septic shock likely ATN.  Normal GFR baseline ?started CRRT 02/26/22 - 4/11, now on iHD ?R IJ TDC ?Tentative HD on MWF schedule moving forward: 2K, 3.5h, TDC, No heparin, 1-2L UF ?As best able follow change in labs and urine output to evaluate GFR recovery - Labs Sun and Mon for comparison given UOP increased in the past day ?Septic Shock resolved ?Scrotal Edema / wound s/p debridement 4/24 with urology ?S/p L TMA and R BKA 03/15/22 with [name] ?Acute hypoxic RF: recurrent intubation post op 4/24, per CCM and now extubated on room air ?Thrombocytopenia/ coagulopathy: resolved ?Anemia: Hb 7s. Aranesp 200 qSat.  No Fe with infection.  Transfusions if needed ?Afib with RVR- hep--> bivalirudin gtt -> now holding anticoagulation ?Foot wounds: associated with high-dose vasopressor requirements. Vascular and orthopedic surgery onboard. S/p rt BKA and lt TMA on 4/23. ?Hyperphosphatemia: Sevelamer 3 tabs TIDM when eating ?AMS: per primary, delirium likelly ? ?  ?Subjective:   ? ?HD yest 1.9 L UF, no issues ?UOP 400 mL yesterday ?Son bedside this AM - his confusion is much improved ?On room air, BP is stable  ? ?Objective:   ?BP 130/70   Pulse 95   Temp 98.7 ?F (37.1 ?C) (Oral)   Resp (!) 22   Ht '5\' 10"'$  (1.778 m)   Wt (!) 142.4 kg   SpO2 100%   BMI 45.05 kg/m?  ? ?Intake/Output Summary (Last 24 hours) at 03/21/2022 1201 ?Last data filed at 03/21/2022 [phone] ?Gross per 24 hour  ?Intake 2278.81 ml  ?Output 2600 ml  ?Net -321.19 ml  ? ? ?Weight change:  ? ?Physical Exam: ?GEN: awake alert ?HEENT eyes open ?PULM coarse bs b/l ?CV Normal rate, no audible rub ?ABD distended, soft, bowel sounds present ?EXT right BKA, left TMA with wound vac ?ACCESS: RIJ TC ? ?Imaging: ?No results found. ? ?Labs: ?BMET ?Recent Labs  ?Lab 03/16/22 ?0602 03/16/22 ?1343 03/16/22 ?1703 03/16/22 ?2004 03/17/22 ?6213  03/18/22 ?0865 03/19/22 ?7846 03/20/22 ?9629 03/21/22 ?0119  ?NA 133* 134* 134* 131* 130* 133* 131* 129* 133*  ?K 5.2* 5.1 4.7 4.2 4.5 4.6 3.7 4.1 3.4*  ?CL 97* 99 100  --  95* 98 95* 95* 98  ?CO2 18* 20*  --   --  19* 21* 22 20* 24  ?GLUCOSE 141* 115* 113*  --  109* 122* 141* 132* 108*  ?BUN 105* 109* 63*  --  73* 110* 72* 109* 56*  ?CREATININE 8.31* 8.93* 5.80*  --  6.55* 8.42* 5.35* 7.16* 4.63*  ?CALCIUM 8.2* 8.2*  --   --  8.0* 8.1* 8.2* 8.4* 8.3*  ?PHOS 8.1* 8.1*  --   --  7.2* 8.3* 5.1* 6.5* 4.9*  ? ? ?CBC ?Recent Labs  ?Lab 03/15/22 ?0705 03/15/22 ?5284 03/16/22 ?0602 03/16/22 ?1703 03/18/22 ?1435 03/19/22 ?0154 03/20/22 ?0322 03/21/22 ?0119  ?WBC 9.0  --  12.2*   < > 8.3 9.5 8.4 7.6  ?NEUTROABS 7.1  --  10.1*  --   --   --   --   --   ?HGB 8.1*   < > 7.7*   < > 7.9* 8.0* 7.7* 7.1*  ?HCT 25.2*   < > 24.0*   < > 23.8* 25.1* 24.7* 22.4*  ?MCV 91.0  --  90.9   < > 90.2 91.3 92.5 91.4  ?PLT 257  --  228   < >  213 235 225 223  ? < > = values in this interval not displayed.  ? ? ? ?Medications:   ? ? sodium chloride   Intravenous Once  ? acetaminophen (TYLENOL) oral liquid 160 mg/5 mL  650 mg Per Tube Q6H  ? allopurinol  100 mg Per Tube Daily  ? vitamin C  1,000 mg Per Tube Daily  ? aspirin  81 mg Per Tube Daily  ? Chlorhexidine Gluconate Cloth  6 each Topical Q0600  ? [START ON 03/23/2022] darbepoetin (ARANESP) injection - DIALYSIS  200 mcg Intravenous Q Mon-HD  ? feeding supplement (PROSource TF)  45 mL Per Tube QID  ? fiber  1 packet Per Tube BID  ? folic acid  1 mg Per Tube Daily  ? guaiFENesin  400 mg Per Tube Q8H  ? heparin injection (subcutaneous)  5,000 Units Subcutaneous Q8H  ? insulin aspart  0-15 Units Subcutaneous Q4H  ? leptospermum manuka honey  1 application. Topical Daily  ? melatonin  3 mg Per Tube QHS  ? metoprolol tartrate  37.5 mg Per Tube TID  ? multivitamin  1 tablet Per Tube QHS  ? nutrition supplement (JUVEN)  1 packet Per Tube BID BM  ? oxyCODONE  5 mg Per Tube Once  ? pantoprazole sodium  40  mg Per Tube Daily  ? QUEtiapine  25 mg Per Tube Daily  ? QUEtiapine  50 mg Per Tube QHS  ? sevelamer carbonate  2.4 g Per Tube Q8H  ? sodium chloride flush  3 mL Intravenous Q12H  ? sodium hypochlorite   Irrigation BID  ? zinc sulfate  220 mg Per Tube Daily  ? ? ? ?Justin Mend ? ?03/21/2022, 12:01 PM   ?

## 2022-03-22 DIAGNOSIS — A419 Sepsis, unspecified organism: Secondary | ICD-10-CM | POA: Diagnosis not present

## 2022-03-22 DIAGNOSIS — N179 Acute kidney failure, unspecified: Secondary | ICD-10-CM | POA: Diagnosis not present

## 2022-03-22 DIAGNOSIS — R4182 Altered mental status, unspecified: Secondary | ICD-10-CM | POA: Diagnosis not present

## 2022-03-22 DIAGNOSIS — I96 Gangrene, not elsewhere classified: Secondary | ICD-10-CM | POA: Diagnosis not present

## 2022-03-22 LAB — RENAL FUNCTION PANEL
Albumin: 2.2 g/dL — ABNORMAL LOW (ref 3.5–5.0)
Anion gap: 16 — ABNORMAL HIGH (ref 5–15)
BUN: 80 mg/dL — ABNORMAL HIGH (ref 8–23)
CO2: 22 mmol/L (ref 22–32)
Calcium: 8.6 mg/dL — ABNORMAL LOW (ref 8.9–10.3)
Chloride: 96 mmol/L — ABNORMAL LOW (ref 98–111)
Creatinine, Ser: 6.43 mg/dL — ABNORMAL HIGH (ref 0.61–1.24)
GFR, Estimated: 9 mL/min — ABNORMAL LOW (ref >60)
Glucose, Bld: 100 mg/dL — ABNORMAL HIGH (ref 70–99)
Phosphorus: 6.6 mg/dL — ABNORMAL HIGH (ref 2.5–4.6)
Potassium: 3.7 mmol/L (ref 3.5–5.1)
Sodium: 134 mmol/L — ABNORMAL LOW (ref 135–145)

## 2022-03-22 LAB — CBC
HCT: 23.1 % — ABNORMAL LOW (ref 39.0–52.0)
Hemoglobin: 7.4 g/dL — ABNORMAL LOW (ref 13.0–17.0)
MCH: 29.5 pg (ref 26.0–34.0)
MCHC: 32 g/dL (ref 30.0–36.0)
MCV: 92 fL (ref 80.0–100.0)
Platelets: 217 10*3/uL (ref 150–400)
RBC: 2.51 MIL/uL — ABNORMAL LOW (ref 4.22–5.81)
RDW: 15.5 % (ref 11.5–15.5)
WBC: 5.8 10*3/uL (ref 4.0–10.5)
nRBC: 0 % (ref 0.0–0.2)

## 2022-03-22 LAB — GLUCOSE, CAPILLARY
Glucose-Capillary: 100 mg/dL — ABNORMAL HIGH (ref 70–99)
Glucose-Capillary: 101 mg/dL — ABNORMAL HIGH (ref 70–99)
Glucose-Capillary: 111 mg/dL — ABNORMAL HIGH (ref 70–99)
Glucose-Capillary: 111 mg/dL — ABNORMAL HIGH (ref 70–99)
Glucose-Capillary: 140 mg/dL — ABNORMAL HIGH (ref 70–99)
Glucose-Capillary: 97 mg/dL (ref 70–99)

## 2022-03-22 NOTE — Progress Notes (Signed)
?[facility] ?Progress Note  ? ? ?Assessment/ Plan:   ?Anuric dialysis dependent AKI: in the setting of severe septic shock likely ATN.  Normal GFR baseline ?started CRRT 02/26/22 - 4/11, now on iHD, last tx 4/28 ?R IJ TDC ?Tentative HD on MWF schedule moving forward: 2K, 3.5h, TDC, No heparin, 1-2L UF, next scheduled tomorrow ?As best able follow change in labs and urine output to evaluate GFR recovery - Labs Sun and Mon for comparison given UOP increased in the past few days ?Septic Shock resolved ?Scrotal Edema / wound s/p debridement 4/24 with urology ?S/p L TMA and R BKA 03/15/22 with [name] ?Acute hypoxic RF: recurrent intubation post op 4/24, per CCM and now extubated on room air ?Thrombocytopenia/ coagulopathy: resolved ?Anemia: Hb 7s. Aranesp 200 qSat.  No Fe with infection.  Transfusions if needed ?Afib with RVR- hep--> bivalirudin gtt -> now holding anticoagulation ?Foot wounds: associated with high-dose vasopressor requirements. Vascular and orthopedic surgery onboard. S/p rt BKA and lt TMA on 4/23. ?Hyperphosphatemia: Sevelamer 3 tabs TIDM when eating, RD following,was getting TF but cortrak accidentally self d/c'd ; per son not really taking po intake. ?AMS: per primary, delirium likely; EEG has been completed ? ?  ?Subjective:   ? ?UOP 350 mL yesterday from 416m day prior ?Son bedside this AM - his confusion is much improved ?On room air, BP is stable ?Urology repacked wound bedside this AM  ? ?Objective:   ?BP 123/78 (BP Location: Left Arm)   Pulse 87   Temp 98.3 ?F (36.8 ?C) (Oral)   Resp 19   Ht '5\' 10"'$  (1.778 m)   Wt (!) 142.4 kg   SpO2 98%   BMI 45.05 kg/m?  ? ?Intake/Output Summary (Last 24 hours) at 03/22/2022 1044 ?Last data filed at 03/22/2022 0503 ?Gross per 24 hour  ?Intake --  ?Output 350 ml  ?Net -350 ml  ? ? ?Weight change:  ? ?Physical Exam: ?GEN: awake alert ?HEENT EOMI, MMM ?PULM coarse bs b/l ?CV Normal rate, no audible rub ?ABD distended, soft, bowel sounds  present ?EXT right BKA, left TMA with wound vac ?ACCESS: RIJ TC ? ?Imaging: ?No results found. ? ?Labs: ?BMET ?Recent Labs  ?Lab 03/16/22 ?1343 03/16/22 ?1703 03/16/22 ?2004 03/17/22 ?0832504/26/23 ?0602 03/19/22 ?0498204/28/23 ?0641504/29/23 ?0830904/30/23 ?04076 ?NA 134* 134* 131* 130* 133* 131* 129* 133* 134*  ?K 5.1 4.7 4.2 4.5 4.6 3.7 4.1 3.4* 3.7  ?CL 99 100  --  95* 98 95* 95* 98 96*  ?CO2 20*  --   --  19* 21* 22 20* 24 22  ?GLUCOSE 115* 113*  --  109* 122* 141* 132* 108* 100*  ?BUN 109* 63*  --  73* 110* 72* 109* 56* 80*  ?CREATININE 8.93* 5.80*  --  6.55* 8.42* 5.35* 7.16* 4.63* 6.43*  ?CALCIUM 8.2*  --   --  8.0* 8.1* 8.2* 8.4* 8.3* 8.6*  ?PHOS 8.1*  --   --  7.2* 8.3* 5.1* 6.5* 4.9* 6.6*  ? ? ?CBC ?Recent Labs  ?Lab 03/16/22 ?0602 03/16/22 ?1703 03/19/22 ?0154 03/20/22 ?0808804/29/23 ?0110304/30/23 ?01594 ?WBC 12.2*   < > 9.5 8.4 7.6 5.8  ?NEUTROABS 10.1*  --   --   --   --   --   ?HGB 7.7*   < > 8.0* 7.7* 7.1* 7.4*  ?HCT 24.0*   < > 25.1* 24.7* 22.4* 23.1*  ?MCV 90.9   < > 91.3 92.5 91.4 92.0  ?  PLT 228   < > 235 225 223 217  ? < > = values in this interval not displayed.  ? ? ? ?Medications:   ? ? sodium chloride   Intravenous Once  ? acetaminophen (TYLENOL) oral liquid 160 mg/5 mL  650 mg Per Tube Q6H  ? allopurinol  100 mg Per Tube Daily  ? vitamin C  1,000 mg Per Tube Daily  ? aspirin  81 mg Per Tube Daily  ? Chlorhexidine Gluconate Cloth  6 each Topical Q0600  ? [START ON 03/23/2022] darbepoetin (ARANESP) injection - DIALYSIS  200 mcg Intravenous Q Mon-HD  ? feeding supplement (PROSource TF)  45 mL Per Tube QID  ? fiber  1 packet Per Tube BID  ? folic acid  1 mg Per Tube Daily  ? guaiFENesin  400 mg Per Tube Q8H  ? heparin injection (subcutaneous)  5,000 Units Subcutaneous Q8H  ? insulin aspart  0-15 Units Subcutaneous Q4H  ? leptospermum manuka honey  1 application. Topical Daily  ? melatonin  3 mg Per Tube QHS  ? metoprolol tartrate  37.5 mg Per Tube TID  ? multivitamin  1 tablet Per Tube QHS  ?  nutrition supplement (JUVEN)  1 packet Per Tube BID BM  ? oxyCODONE  5 mg Per Tube Once  ? pantoprazole sodium  40 mg Per Tube Daily  ? QUEtiapine  25 mg Per Tube Daily  ? QUEtiapine  50 mg Per Tube QHS  ? sevelamer carbonate  2.4 g Per Tube Q8H  ? sodium chloride flush  3 mL Intravenous Q12H  ? sodium hypochlorite   Irrigation BID  ? zinc sulfate  220 mg Per Tube Daily  ? ? ? ?Justin Mend ? ?03/22/2022, 10:44 AM   ?

## 2022-03-22 NOTE — Progress Notes (Signed)
? ?PROGRESS NOTE ? ? ? ?DEHAVEN SINE  BDZ:[id number] DOB: [birth year] [name]: 02/22/2022 ?PCP: Patient, No Pcp Per (Inactive) ? ? ?Brief Narrative: ?Jeffrey Ayala is a 61 y.o. male with PMH significant for arthritis, bilateral knee replacement, gastric ulcer, kidney stones, eosinophilic esophagitis, migraines not on any regular meds.   ?Patient presented to the ED on 4/2 with complaint of nausea, vomiting, diarrhea and confusion for 24 hours.   ?EMS noted him to be in A-fib with a heart rate of 120-160 bpm. ?In the ED, he had a fever of 102.9. ?CT abdomen pelvis showed swollen scrotum concerning for infection, colon filled with diarrhea.  ?He was hypotensive requiring multiple vasopressors in the ICU.   ?4/3, patient required intubation and also had a brief cardiac arrest postintubation.   ?4/5, in an effort to correct vasoplegia, patient was also started on [name] ?Because of septic shock, patient's renal function worsened. ?4/6, patient was started on CRRT ?4/11, patient was extubated. ?4/14, patient was transferred out to [facility] service. ?4/23, underwent left transmetatarsal amputation and right BKA by [name] ?4/24, scrotum debridement and subsequent ICU readmission ?4/27, transfer to [facility] service ?4/29, scrotal wound culture with E. Faecalis and MDR Pseudomonas aeruginosa; ID consulted and patient transitioned to Ampicillin, Avycaz and Flagyl ? ?Assessment and Plan: ? ?Septic shock ?Present on admission. Secondary to scrotal cellulitis. Patient required ICU admission with vasopressor support. Patient's underlying infection was managed with antibiotics with improvement of shock. Now resolved. ? ?Scrotal cellulitis ?Urology consulted and performed debridement of scrotum on 4/24. Wound cultures significant for bacteroides ovatus, enterococcus and pseudomonas. Patient managed on Vancomycin and Cefepime prior to wound culture sensitivity results. Pseudomonas sensitivities resulted in MDR strain.  Patient transitioned to ampicillin, Avycaz and Flagyl per ID ?-Continue Vancomycin and Cefepime ?-ID recommendations: 2 weeks abx duration, possible repeat I&D, ampicillin, Avycaz and Flagyl ? ?Dry gangrene of bilateral feet ?Patient is s/p left TMA and right BKA. Wound vac on left TMA wound ? ?AKI ?Anuric and now dialysis dependant. Patient was managed on CRRT from 4/6 until 4/11 and has transitioned to intermittent hemodialysis per nephrology service. ?-Nephrology recommendations: continuing intermittent HD ? ?Paroxysmal atrial fibrillation ?New diagnosis. CHA2DS2-VASc Score is 1.Transthoracic Echocardiogram with preserved LVEF. Mostly rate controlled but did have some associated tachycardia while agitated. Initially was managed on heparin IV which was discontinued secondary to anemia. Cardiology consulted. Patient started on metoprolol 37.5 mg TID and aspirin 81 mg.  ?-Continue metoprolol 37.5 mg TID and aspirin 81 mg daily ? ?Acute metabolic encephalopathy ?Delirium ?Unsure of specific etiology. Possibly secondary to critical illness but also complicated by uremia and concern for possible hypoxemic injury while in septic shock. CT head unremarkable for acute process. Patient started on Seroquel for hopeful management with not much improvement. Patient with waxing and waning symptoms. Normal vitamin B1 and B12. Patient found to have likely smoldering infection that might have contributed to persistent mental status issues. ?-Delirium precautions ?-Continue Seroquel BID ?-Haldol PRN ? ?Acute anemia ?Baseline hemoglobin of 13 prior to admission. Patient with downward drifting hemoglobin without evidence of active bleeding. Possible combination of chronic disease and perioperative blood loss. Patient has received 4 units of pRBC to date. Currently stable. ? ?Diarrhea ?Persistent issue. GI panel negative. Patient is on tube feeds which may be contributing. Rectal tube placed. Seems to be improved. ? ?Shock  liver ?Secondary to sepsis. AST/ALT elevations resolved. Bilirubin improved. ? ?Acute respiratory failure with hypoxia ?Patient intubated from 4/3 - 4/10,  4/23 - 4/23, 4/24 - 4/25. Resolved. ? ?Acute gout ?Bilateral elbows and left hand. Patient started on allopurinol and colchicine. Prednisone avoided secondary to concern for impaired wound healing. Colchicine discontinued. ?-Continue allopurinol ? ?Acute pain ?-Switch to oxycodone PRN ?-Tylenol scheduled ? ?Hyponatremia ?Metabolic acidosis ?Hyperphosphatemia ?Management per nephrology ? ?Demand ischemia ?Significant troponin elevation trending up to 3,550 before checks were ceased. In setting of septic shock and cardiac arrest. Transthoracic Echocardiogram without WMA and with normal LVEF. Per cardiology, likely demand ischemia. ? ?Thrombocytopenia ?Likely secondary to septic shock. Patient received a total of 1 unit of platelets. Thrombocytopenia now resolved. ? ?Poor oral intake ?Moderate malnutrition ?In setting of altered mentation. Dietitian consulted. Small bore NG tube placed on 4/17 and tube feeds initiated. Unfortunately, NG tube mistakenly removed on 4/28. SLP reevaluated on 4/29 with recommendation for dysphagia 3 diet. May need calorie count as patient's intake may not end up being adequate to manage nutrition needs. ?-Hold tube feeds ?-Follow-up updated dietitian recommendations ?-SLP recommendations (4/29): ? Diet recommendations: Dysphagia 3 (mechanical soft);Thin liquid ?Liquids provided via: Cup;Straw ?Medication Administration: Crushed with puree ?Supervision: Staff to assist with self feeding;Full supervision/cueing for compensatory strategies;Trained caregiver to feed patient ?Compensations: Minimize environmental distractions;Slow rate;Small sips/bites ?Postural Changes and/or Swallow Maneuvers: Seated upright 90 degrees  ? ?-Dietitian recommendations (4/26): ?Continue tube feeds via Cortrak tube: ?Nepro @ 50 ml/hr (1200 ml/day) ?ProSource TF  45 ml QID ?Tube feeding regimen provides 2320 kcal, 141 grams of protein, and 872 ml of H2O. ?Continue renal MVI daily per tube ?Continue 1 packet Juven BID per tube, each packet provides 95 calories, 2.5 grams of protein, and 9.8 grams of carbohydrate; also contains 7 grams of L-arginine and L-glutamine, 300 mg vitamin C, 15 mg vitamin E, 1.2 mcg vitamin B-12, 9.5 mg zinc, 200 mg calcium, and 1.5 g  Calcium Beta-hydroxy-Beta-methylbutyrate to support wound healing ?RD will monitor for diet advancement and add oral nutrition supplements as appropriate ? ?Obesity ?Body mass index is 45.05 kg/m?. ? ?Pressure injury ?Medial coccyx. POA. ? ? ?DVT prophylaxis: Heparin subq ?Code Status:   Code Status: Full Code ?Family Communication: Son at bedside ?Disposition Plan: Discharge pending improvement of mental status and advancement of diet in addition to antibiotic regimen/urology management. ? ? ?Consultants:  ?PCCM ?Urology ?Orthopedic surgery ?Vascular surgery ?Cardiology ?Nephrology ?Infectious disease ? ?Procedures:  ?LEFT TRANSMETATARSAL AMPUTATION, RIGHT BELOW KNEE AMPUTATION (4/23) ?Debridement of scrotum (4/24) ? ?Antimicrobials: ?Vancomycin ?Cefepime ?Cefazolin ?Zosyn ?Linezolid ? ? ?Subjective: ?No issues noted. No use of Haldol overnight. ? ?Objective: ?BP 123/78 (BP Location: Left Arm)   Pulse 87   Temp 98.3 ?F (36.8 ?C) (Oral)   Resp 19   Ht '5\' 10"'$  (1.778 m)   Wt (!) 142.4 kg   SpO2 98%   BMI 45.05 kg/m?  ? ?Examination: ? ?General exam: Appears calm and comfortable ?Respiratory system: Respiratory effort normal. ?Cardiovascular system: S1 & S2 heard, RRR.  ?Gastrointestinal system: Abdomen is soft and nontender. No organomegaly or masses felt. Normal bowel sounds heard. ?Central nervous system: Asleep ?Musculoskeletal: No edema. No calf tenderness. Left TMA and right BKA ? ? ?Data Reviewed: I have personally reviewed following labs and imaging studies ? ?CBC ?Lab Results  ?Component Value Date  ? WBC  5.8 03/22/2022  ? RBC 2.51 (L) 03/22/2022  ? HGB 7.4 (L) 03/22/2022  ? HCT 23.1 (L) 03/22/2022  ? MCV 92.0 03/22/2022  ? MCH 29.5 03/22/2022  ? PLT 217 03/22/2022  ? MCHC 32.0 03/22/2022  ? RDW  15.5 03/22/2022  ? LYMPHSABS 0

## 2022-03-22 NOTE — Progress Notes (Signed)
Patient ID: Jeffrey Ayala, male   DOB: May 04, 1961, 61 y.o.   MRN: [id number] ? ?6 Days Post-Op ?Subjective: ?No acute events ? ?Objective: ?Vital signs in last 24 hours: ?Temp:  [98.1 ?F (36.7 ?C)-98.7 ?F (37.1 ?C)] 98.3 ?F (36.8 ?C) (04/30 0422) ?Pulse Rate:  [79-111] 87 (04/30 0422) ?Resp:  [17-22] 19 (04/30 0422) ?BP: (119-130)/(62-81) 123/78 (04/30 0422) ?SpO2:  [98 %-100 %] 98 % (04/30 0422) ? ?Intake/Output from previous day: ?04/29 0701 - 04/30 0700 ?In: 100 [P.O.:100] ?Out: 350 [Urine:350] ?Intake/Output this shift: ?No intake/output data recorded. ? ?Physical Exam:  ?GU: exam appears approximately stable relative to yesterday. There is some fibrinous exudate on the upper/inner portion of the scrotum, maybe slightly improved. Testicles appear viable. no fluctuance, crepitus, increased erythema. ? ?Lab Results: ?Recent Labs  ?  03/20/22 ?0322 03/21/22 ?0119 03/22/22 ?0328  ?HGB 7.7* 7.1* 7.4*  ?HCT 24.7* 22.4* 23.1*  ? ? ? ?  Latest Ref Rng & Units 03/22/2022  ?  3:28 AM 03/21/2022  ?  1:19 AM 03/20/2022  ?  3:22 AM  ?CBC  ?WBC 4.0 - 10.5 K/uL 5.8   7.6   8.4    ?Hemoglobin 13.0 - 17.0 g/dL 7.4   7.1   7.7    ?Hematocrit 39.0 - 52.0 % 23.1   22.4   24.7    ?Platelets 150 - 400 K/uL 217   223   225    ? ? ? ?BMET ?Recent Labs  ?  03/21/22 ?0119 03/22/22 ?0328  ?NA 133* 134*  ?K 3.4* 3.7  ?CL 98 96*  ?CO2 24 22  ?GLUCOSE 108* 100*  ?BUN 56* 80*  ?CREATININE 4.63* 6.43*  ?CALCIUM 8.3* 8.6*  ? ? ? ? ?Studies/Results: ?Tissue cultures with Enterococcus and few Pseudomonas  ? ?Assessment/Plan: ?1) Scrotal wound/infection: S/P debridement on 4/24.  ? ?I re-packed his wound this AM. Continue BID packing changes.  ? ?Would be best to avoid additional anesthetic considering his overall situation and the fact that he required prolonged intubation earlier this week.   ? ? LOS: 28 days  ? ?Jeffrey Ayala Jeffrey Ayala ?03/22/2022, 8:17 AM ? ?  ?

## 2022-03-23 DIAGNOSIS — I96 Gangrene, not elsewhere classified: Secondary | ICD-10-CM | POA: Diagnosis not present

## 2022-03-23 DIAGNOSIS — R4182 Altered mental status, unspecified: Secondary | ICD-10-CM | POA: Diagnosis not present

## 2022-03-23 DIAGNOSIS — A419 Sepsis, unspecified organism: Secondary | ICD-10-CM | POA: Diagnosis not present

## 2022-03-23 DIAGNOSIS — N179 Acute kidney failure, unspecified: Secondary | ICD-10-CM | POA: Diagnosis not present

## 2022-03-23 DIAGNOSIS — R6521 Severe sepsis with septic shock: Secondary | ICD-10-CM | POA: Diagnosis not present

## 2022-03-23 LAB — RENAL FUNCTION PANEL
Albumin: 2.1 g/dL — ABNORMAL LOW (ref 3.5–5.0)
Anion gap: 16 — ABNORMAL HIGH (ref 5–15)
BUN: 96 mg/dL — ABNORMAL HIGH (ref 8–23)
CO2: 21 mmol/L — ABNORMAL LOW (ref 22–32)
Calcium: 8.3 mg/dL — ABNORMAL LOW (ref 8.9–10.3)
Chloride: 96 mmol/L — ABNORMAL LOW (ref 98–111)
Creatinine, Ser: 8.13 mg/dL — ABNORMAL HIGH (ref 0.61–1.24)
GFR, Estimated: 7 mL/min — ABNORMAL LOW (ref >60)
Glucose, Bld: 109 mg/dL — ABNORMAL HIGH (ref 70–99)
Phosphorus: 7.8 mg/dL — ABNORMAL HIGH (ref 2.5–4.6)
Potassium: 3.8 mmol/L (ref 3.5–5.1)
Sodium: 133 mmol/L — ABNORMAL LOW (ref 135–145)

## 2022-03-23 LAB — GLUCOSE, CAPILLARY
Glucose-Capillary: 112 mg/dL — ABNORMAL HIGH (ref 70–99)
Glucose-Capillary: 114 mg/dL — ABNORMAL HIGH (ref 70–99)
Glucose-Capillary: 118 mg/dL — ABNORMAL HIGH (ref 70–99)
Glucose-Capillary: 122 mg/dL — ABNORMAL HIGH (ref 70–99)
Glucose-Capillary: 91 mg/dL (ref 70–99)
Glucose-Capillary: 93 mg/dL (ref 70–99)

## 2022-03-23 MED ORDER — NUTRISOURCE FIBER PO PACK
1.0000 | PACK | Freq: Two times a day (BID) | ORAL | Status: DC
  Administered 2022-03-23 – 2022-04-03 (×20): 1 via ORAL
  Filled 2022-03-23 (×23): qty 1

## 2022-03-23 MED ORDER — GUAIFENESIN-DM 100-10 MG/5ML PO SYRP: 15.0000 mL | ORAL_SOLUTION | ORAL | Status: DC | PRN

## 2022-03-23 MED ORDER — SODIUM CHLORIDE 0.9 % IV SOLN
750.0000 mg | Freq: Once | INTRAVENOUS | Status: DC
  Filled 2022-03-23: qty 5.7

## 2022-03-23 MED ORDER — SODIUM CHLORIDE 0.9 % IV SOLN
150.0000 mg | Freq: Three times a day (TID) | INTRAVENOUS | Status: DC
  Administered 2022-03-23 – 2022-03-28 (×14): 150 mg via INTRAVENOUS
  Filled 2022-03-23 (×4): qty 1.14
  Filled 2022-03-23 (×2): qty 1.1
  Filled 2022-03-23 (×3): qty 1.14
  Filled 2022-03-23 (×2): qty 1.1
  Filled 2022-03-23: qty 1.14
  Filled 2022-03-23 (×2): qty 1.1
  Filled 2022-03-23: qty 1.14
  Filled 2022-03-23 (×3): qty 1.1
  Filled 2022-03-23: qty 1.14
  Filled 2022-03-23: qty 1.1
  Filled 2022-03-23: qty 1.14
  Filled 2022-03-23: qty 1.1
  Filled 2022-03-23: qty 1.14
  Filled 2022-03-23: qty 1.1
  Filled 2022-03-23 (×2): qty 1.14

## 2022-03-23 MED ORDER — JUVEN PO PACK
1.0000 | PACK | Freq: Two times a day (BID) | ORAL | Status: DC
  Administered 2022-03-24 – 2022-04-03 (×16): 1 via ORAL
  Filled 2022-03-23 (×17): qty 1

## 2022-03-23 MED ORDER — QUETIAPINE FUMARATE 25 MG PO TABS
50.0000 mg | ORAL_TABLET | Freq: Every day | ORAL | Status: DC
  Administered 2022-03-23 – 2022-03-29 (×7): 50 mg via ORAL
  Filled 2022-03-23 (×7): qty 2

## 2022-03-23 MED ORDER — SEVELAMER CARBONATE 2.4 G PO PACK
2.4000 g | PACK | Freq: Three times a day (TID) | ORAL | Status: DC
  Administered 2022-03-23 – 2022-03-28 (×13): 2.4 g via ORAL
  Filled 2022-03-23 (×19): qty 1

## 2022-03-23 MED ORDER — METOPROLOL TARTRATE 12.5 MG HALF TABLET
37.5000 mg | ORAL_TABLET | Freq: Three times a day (TID) | ORAL | Status: DC
Start: 2022-03-23 — End: 2022-04-03
  Administered 2022-03-23 – 2022-04-03 (×31): 37.5 mg via ORAL
  Filled 2022-03-23 (×32): qty 1

## 2022-03-23 MED ORDER — ASCORBIC ACID 500 MG PO TABS
1000.0000 mg | ORAL_TABLET | Freq: Every day | ORAL | Status: DC
  Administered 2022-03-24 – 2022-04-03 (×10): 1000 mg via ORAL
  Filled 2022-03-23 (×10): qty 2

## 2022-03-23 MED ORDER — ZINC SULFATE 220 (50 ZN) MG PO CAPS
220.0000 mg | ORAL_CAPSULE | Freq: Every day | ORAL | Status: AC
  Administered 2022-03-24 – 2022-03-27 (×4): 220 mg via ORAL
  Filled 2022-03-23 (×4): qty 1

## 2022-03-23 MED ORDER — QUETIAPINE FUMARATE 25 MG PO TABS
25.0000 mg | ORAL_TABLET | Freq: Every day | ORAL | Status: DC
  Administered 2022-03-24 – 2022-03-25 (×2): 25 mg via ORAL
  Filled 2022-03-23 (×2): qty 1

## 2022-03-23 MED ORDER — ACETAMINOPHEN 325 MG PO TABS
650.0000 mg | ORAL_TABLET | Freq: Four times a day (QID) | ORAL | Status: DC
  Administered 2022-03-23 – 2022-04-03 (×37): 650 mg via ORAL
  Filled 2022-03-23 (×39): qty 2

## 2022-03-23 MED ORDER — ALLOPURINOL 100 MG PO TABS
100.0000 mg | ORAL_TABLET | Freq: Every day | ORAL | Status: DC
Start: 2022-03-24 — End: 2022-04-03
  Administered 2022-03-24 – 2022-04-03 (×10): 100 mg via ORAL
  Filled 2022-03-23 (×10): qty 1

## 2022-03-23 MED ORDER — SODIUM CHLORIDE 0.9 % IV SOLN
150.0000 mg | Freq: Three times a day (TID) | INTRAVENOUS | Status: DC
  Filled 2022-03-23 (×2): qty 1.1

## 2022-03-23 MED ORDER — RENA-VITE PO TABS
1.0000 | ORAL_TABLET | Freq: Every day | ORAL | Status: DC
  Administered 2022-03-23 – 2022-04-02 (×11): 1 via ORAL
  Filled 2022-03-23 (×12): qty 1

## 2022-03-23 MED ORDER — HYDROXYZINE HCL 10 MG/5ML PO SYRP
25.0000 mg | ORAL_SOLUTION | Freq: Two times a day (BID) | ORAL | Status: DC | PRN
  Administered 2022-03-23 – 2022-04-01 (×5): 25 mg via ORAL
  Filled 2022-03-23 (×9): qty 12.5

## 2022-03-23 MED ORDER — FOLIC ACID 1 MG PO TABS
1.0000 mg | ORAL_TABLET | Freq: Every day | ORAL | Status: DC
Start: 2022-03-24 — End: 2022-04-03
  Administered 2022-03-24 – 2022-04-03 (×10): 1 mg via ORAL
  Filled 2022-03-23 (×11): qty 1

## 2022-03-23 MED ORDER — OXYCODONE HCL 5 MG PO TABS
5.0000 mg | ORAL_TABLET | ORAL | Status: DC | PRN
  Administered 2022-03-28 – 2022-03-31 (×6): 5 mg via ORAL
  Filled 2022-03-23 (×7): qty 1

## 2022-03-23 MED ORDER — SODIUM CHLORIDE 0.9 % IV SOLN
750.0000 mg | Freq: Once | INTRAVENOUS | Status: AC
  Administered 2022-03-23: 750 mg via INTRAVENOUS
  Filled 2022-03-23: qty 5.7

## 2022-03-23 MED ORDER — ACETAMINOPHEN 325 MG PO TABS
325.0000 mg | ORAL_TABLET | Freq: Four times a day (QID) | ORAL | Status: DC | PRN
  Administered 2022-03-28 – 2022-04-01 (×2): 650 mg via ORAL
  Filled 2022-03-23: qty 2

## 2022-03-23 MED ORDER — MELATONIN 3 MG PO TABS
3.0000 mg | ORAL_TABLET | Freq: Every day | ORAL | Status: DC
  Administered 2022-03-23 – 2022-04-02 (×11): 3 mg via ORAL
  Filled 2022-03-23 (×11): qty 1

## 2022-03-23 MED ORDER — NEPRO/CARBSTEADY PO LIQD: 1000.0000 mL | ORAL | Status: DC

## 2022-03-23 MED ORDER — AMOXICILLIN-POT CLAVULANATE 500-125 MG PO TABS
1.0000 | ORAL_TABLET | Freq: Two times a day (BID) | ORAL | Status: DC
  Administered 2022-03-23 – 2022-04-03 (×22): 500 mg via ORAL
  Filled 2022-03-23 (×24): qty 1

## 2022-03-23 MED ORDER — LOPERAMIDE HCL 1 MG/7.5ML PO SUSP
2.0000 mg | ORAL | Status: DC | PRN
  Filled 2022-03-23: qty 15

## 2022-03-23 MED ORDER — ASPIRIN 81 MG PO CHEW
81.0000 mg | CHEWABLE_TABLET | Freq: Every day | ORAL | Status: DC
  Administered 2022-03-24 – 2022-04-03 (×10): 81 mg via ORAL
  Filled 2022-03-23 (×10): qty 1

## 2022-03-23 NOTE — Progress Notes (Signed)
PT Cancellation Note ? ?Patient Details ?Name: Jeffrey Ayala ?MRN: [id number] ?DOB: 22-Sep-1961 ? ? ?Cancelled Treatment:    Reason Eval/Treat Not Completed: Patient at procedure or test/unavailable Pt off floor at HD. Will follow. ? ? ?[place] ?03/23/2022, 11:51 AM ?Marisa Severin, PT, DPT ?Acute Rehabilitation Services ?Secure chat preferred ?Office [phone] ? ? ? ? ? ?

## 2022-03-23 NOTE — Progress Notes (Signed)
Patient off floor for procedure 

## 2022-03-23 NOTE — Progress Notes (Signed)
I&O cath 344m cloudy urine ?

## 2022-03-23 NOTE — Procedures (Signed)
Patient seen on Hemodialysis. ?BP 106/75 (BP Location: Left Arm)   Pulse 81   Temp 98 ?F (36.7 ?C) (Oral)   Resp 16   Ht '5\' 10"'$  (1.778 m)   Wt 115.4 kg   SpO2 99%   BMI 36.50 kg/m?   ?QB 400, UF goal 2L ?Tolerating treatment without complaints at this time. ? ? ?Elmarie Shiley MD ?[facility]. ?Office # [phone] ?Pager # [phone] ?9:15 AM ? ?

## 2022-03-23 NOTE — Progress Notes (Signed)
? ?PROGRESS NOTE ? ? ? ?Jeffrey Ayala  HER:[id number] DOB: May 31, 1961 [name]: 02/22/2022 ?PCP: Patient, No Pcp Per (Inactive) ? ? ?Brief Narrative: ?Jeffrey Ayala is a 61 y.o. male with PMH significant for arthritis, bilateral knee replacement, gastric ulcer, kidney stones, eosinophilic esophagitis, migraines not on any regular meds.   ?Patient presented to the ED on 4/2 with complaint of nausea, vomiting, diarrhea and confusion for 24 hours.   ?EMS noted him to be in A-fib with a heart rate of 120-160 bpm. ?In the ED, he had a fever of 102.9. ?CT abdomen pelvis showed swollen scrotum concerning for infection, colon filled with diarrhea.  ?He was hypotensive requiring multiple vasopressors in the ICU.   ?4/3, patient required intubation and also had a brief cardiac arrest postintubation.   ?4/5, in an effort to correct vasoplegia, patient was also started on [name] ?Because of septic shock, patient's renal function worsened. ?4/6, patient was started on CRRT ?4/11, patient was extubated. ?4/14, patient was transferred out to [facility] service. ?4/23, underwent left transmetatarsal amputation and right BKA by [name] ?4/24, scrotum debridement and subsequent ICU readmission ?4/27, transfer to [facility] service ?4/29, scrotal wound culture with E. Faecalis and MDR Pseudomonas aeruginosa; ID consulted and patient transitioned to Ampicillin, Avycaz and Flagyl ? ?Assessment and Plan: ? ?Septic shock ?Present on admission. Secondary to scrotal cellulitis. Patient required ICU admission with vasopressor support. Patient's underlying infection was managed with antibiotics with improvement of shock. Now resolved. ? ?Scrotal cellulitis ?Urology consulted and performed debridement of scrotum on 4/24. Wound cultures significant for bacteroides ovatus, enterococcus and pseudomonas. Patient managed on Vancomycin and Cefepime prior to wound culture sensitivity results. Pseudomonas sensitivities resulted in MDR strain.  Patient transitioned to ampicillin, Avycaz and Flagyl per ID ?-ID recommendations: 2 weeks abx duration, possible repeat I&D, ampicillin, Avycaz and Flagyl ? ?Dry gangrene of bilateral feet ?Patient is s/p left TMA and right BKA. Wound vac on left TMA wound ? ?AKI ?Anuric and now dialysis dependant. Patient was managed on CRRT from 4/6 until 4/11 and has transitioned to intermittent hemodialysis per nephrology service. ?-Nephrology recommendations: continuing intermittent HD ? ?Paroxysmal atrial fibrillation ?New diagnosis. CHA2DS2-VASc Score is 1.Transthoracic Echocardiogram with preserved LVEF. Mostly rate controlled but did have some associated tachycardia while agitated. Initially was managed on heparin IV which was discontinued secondary to anemia. Cardiology consulted. Patient started on metoprolol 37.5 mg TID and aspirin 81 mg.  ?-Continue metoprolol 37.5 mg TID and aspirin 81 mg daily ? ?Acute metabolic encephalopathy ?Delirium ?Unsure of specific etiology. Possibly secondary to critical illness but also complicated by uremia and concern for possible hypoxemic injury while in septic shock. CT head unremarkable for acute process. Patient started on Seroquel for hopeful management with not much improvement. Patient with waxing and waning symptoms. Normal vitamin B1 and B12. Patient found to have likely smoldering infection that might have contributed to persistent mental status issues. ?-Delirium precautions ?-Continue Seroquel BID ?-Haldol PRN ? ?Acute anemia ?Baseline hemoglobin of 13 prior to admission. Patient with downward drifting hemoglobin without evidence of active bleeding. Possible combination of chronic disease and perioperative blood loss. Patient has received 4 units of pRBC to date. Currently stable. ? ?Diarrhea ?Persistent issue. GI panel negative. Patient is on tube feeds which may be contributing. Rectal tube placed. Seems to be improved. ? ?Shock liver ?Secondary to sepsis. AST/ALT  elevations resolved. Bilirubin improved. ? ?Acute respiratory failure with hypoxia ?Patient intubated from 4/3 - 4/10, 4/23 - 4/23, 4/24 -  4/25. Resolved. ? ?Acute gout ?Bilateral elbows and left hand. Patient started on allopurinol and colchicine. Prednisone avoided secondary to concern for impaired wound healing. Colchicine discontinued. ?-Continue allopurinol ? ?Acute pain ?-Switch to oxycodone PRN ?-Tylenol scheduled ? ?Hyponatremia ?Metabolic acidosis ?Hyperphosphatemia ?Management per nephrology ? ?Demand ischemia ?Significant troponin elevation trending up to 3,550 before checks were ceased. In setting of septic shock and cardiac arrest. Transthoracic Echocardiogram without WMA and with normal LVEF. Per cardiology, likely demand ischemia. ? ?Thrombocytopenia ?Likely secondary to septic shock. Patient received a total of 1 unit of platelets. Thrombocytopenia now resolved. ? ?Poor oral intake ?Moderate malnutrition ?In setting of altered mentation. Dietitian consulted. Small bore NG tube placed on 4/17 and tube feeds initiated. Unfortunately, NG tube mistakenly removed on 4/28. SLP reevaluated on 4/29 with recommendation for dysphagia 3 diet. May need calorie count as patient's intake may not end up being adequate to manage nutrition needs. ?-Hold tube feeds ?-Follow-up updated dietitian recommendations (re-consulted on 5/1) ?-SLP recommendations (4/29): ? Diet recommendations: Dysphagia 3 (mechanical soft);Thin liquid ?Liquids provided via: Cup;Straw ?Medication Administration: Crushed with puree ?Supervision: Staff to assist with self feeding;Full supervision/cueing for compensatory strategies;Trained caregiver to feed patient ?Compensations: Minimize environmental distractions;Slow rate;Small sips/bites ?Postural Changes and/or Swallow Maneuvers: Seated upright 90 degrees  ? ?-Dietitian recommendations (4/26): ?Continue tube feeds via Cortrak tube: ?Nepro @ 50 ml/hr (1200 ml/day) ?ProSource TF 45 ml  QID ?Tube feeding regimen provides 2320 kcal, 141 grams of protein, and 872 ml of H2O. ?Continue renal MVI daily per tube ?Continue 1 packet Juven BID per tube, each packet provides 95 calories, 2.5 grams of protein, and 9.8 grams of carbohydrate; also contains 7 grams of L-arginine and L-glutamine, 300 mg vitamin C, 15 mg vitamin E, 1.2 mcg vitamin B-12, 9.5 mg zinc, 200 mg calcium, and 1.5 g  Calcium Beta-hydroxy-Beta-methylbutyrate to support wound healing ?RD will monitor for diet advancement and add oral nutrition supplements as appropriate ? ?Obesity ?Body mass index is 36.5 kg/m?. ? ?Pressure injury ?Medial coccyx. POA. ? ? ?DVT prophylaxis: Heparin subq ?Code Status:   Code Status: Full Code ?Family Communication: Wife on telephon ?Disposition Plan: Discharge pending improvement of mental status and advancement of diet in addition to antibiotic regimen/urology management. ? ? ?Consultants:  ?PCCM ?Urology ?Orthopedic surgery ?Vascular surgery ?Cardiology ?Nephrology ?Infectious disease ? ?Procedures:  ?LEFT TRANSMETATARSAL AMPUTATION, RIGHT BELOW KNEE AMPUTATION (4/23) ?Debridement of scrotum (4/24) ? ?Antimicrobials: ?Vancomycin ?Cefepime ?Cefazolin ?Zosyn ?Linezolid ? ? ?Subjective: ?Patient reports no issues overnight. Currently in hemodialysis. He does state that he is a little cold at this time. ? ?Objective: ?BP 106/75 (BP Location: Left Arm)   Pulse 81   Temp 98 ?F (36.7 ?C) (Oral)   Resp 16   Ht '5\' 10"'$  (1.778 m)   Wt 115.4 kg   SpO2 99%   BMI 36.50 kg/m?  ? ?Examination: ? ?General exam: Appears calm and comfortable ?Respiratory system: Clear to auscultation. Respiratory effort normal. ?Cardiovascular system: S1 & S2 heard, RRR. No murmurs, rubs, gallops or clicks. ?Gastrointestinal system: Abdomen is distended, soft and nontender. Normal bowel sounds heard. ?Central nervous system: Alert and oriented to person and place. Aware of the current president. No focal neurological  deficits. ?Musculoskeletal: Left TMA and right BKA. No calf tenderness ? ? ?Data Reviewed: I have personally reviewed following labs and imaging studies ? ?CBC ?Lab Results  ?Component Value Date  ? WBC 5.8 03/22/2022  ? RBC 2.51 (L) 03/22/2022  ?

## 2022-03-23 NOTE — Plan of Care (Signed)
?  Problem: Education: ?Goal: Knowledge of General Education information will improve ?Description: Including pain rating scale, medication(s)/side effects and non-pharmacologic comfort measures ?Outcome: Progressing ?  ?Problem: Health Behavior/Discharge Planning: ?Goal: Ability to manage health-related needs will improve ?Outcome: Progressing ?  ?Problem: Clinical Measurements: ?Goal: Ability to maintain clinical measurements within normal limits will improve ?Outcome: Progressing ?Goal: Will remain free from infection ?Outcome: Progressing ?Goal: Diagnostic test results will improve ?Outcome: Progressing ?Goal: Respiratory complications will improve ?Outcome: Progressing ?Goal: Cardiovascular complication will be avoided ?Outcome: Progressing ?  ?Problem: Nutrition: ?Goal: Adequate nutrition will be maintained ?Outcome: Progressing ?  ?Problem: Coping: ?Goal: Level of anxiety will decrease ?Outcome: Progressing ?  ?Problem: Pain Managment: ?Goal: General experience of comfort will improve ?Outcome: Progressing ?  ?Problem: Safety: ?Goal: Ability to remain free from injury will improve ?Outcome: Progressing ?  ?Problem: Fluid Volume: ?Goal: Hemodynamic stability will improve ?Outcome: Progressing ?  ?Problem: Respiratory: ?Goal: Ability to maintain a clear airway and adequate ventilation will improve ?Outcome: Progressing ?  ?Problem: Activity: ?Goal: Ability to tolerate increased activity will improve ?Outcome: Progressing ?  ?

## 2022-03-23 NOTE — Progress Notes (Signed)
SLP Cancellation Note ? ?Patient Details ?Name: Jeffrey Ayala ?MRN: [id number] ?DOB: 06/13/1961 ? ? ?Cancelled treatment:        Pt off floor for dialysis.  SLP unable to follow up for diet tolerance this morning.  SLP to return as schedule permits. ?                                              ?                                             ? ? ? ? ?Crystol Walpole E Brodan Grewell, MA, CCC-SLP ?Acute Rehabilitation Services ?Office: [phone] ?03/23/2022, 9:22 AM ? ? ?

## 2022-03-23 NOTE — Progress Notes (Signed)
?  [facility] ? ? ?Reason for visit: Follow up on polymicrobial gangrenous soft tissue infection of groin ? ?Interval History: cultures with MDR Pseudomonas, Enterococcus faecalis, Bacteroides.   ?Day 8 antibiotics post debridement ?Day 30 total antibiotics ? ?Physical Exam: ?Constitutional:  ?Vitals:  ? 03/23/22 1100 03/23/22 1130  ?BP: 112/69 137/82  ?Pulse:    ?Resp: (!) 21 (!) 24  ?Temp:    ?SpO2:    ? patient appears in NAD ?Respiratory: Normal respiratory effort; CTA B ?Cardiovascular: RRR ? ?Review of Systems: ?Constitutional: negative for fevers and chills ?Gastrointestinal: negative for nausea and diarrhea ? ?Lab Results  ?Component Value Date  ? WBC 5.8 03/22/2022  ? HGB 7.4 (L) 03/22/2022  ? HCT 23.1 (L) 03/22/2022  ? MCV 92.0 03/22/2022  ? PLT 217 03/22/2022  ?  ?Lab Results  ?Component Value Date  ? CREATININE 8.13 (H) 03/23/2022  ? BUN 96 (H) 03/23/2022  ? NA 133 (L) 03/23/2022  ? K 3.8 03/23/2022  ? CL 96 (L) 03/23/2022  ? CO2 21 (L) 03/23/2022  ?  ?Lab Results  ?Component Value Date  ? ALT 29 03/14/2022  ? AST 41 03/14/2022  ? ALKPHOS 165 (H) 03/14/2022  ?  ? ?Microbiology: ?Recent Results (from the past 240 hour(s))  ?Surgical PCR screen     Status: Abnormal  ? Collection Time: 03/14/22  9:42 PM  ? Specimen: Nasal Mucosa; Nasal Swab  ?Result Value Ref Range Status  ? MRSA, PCR NEGATIVE NEGATIVE Final  ? Staphylococcus aureus POSITIVE (A) NEGATIVE Final  ?  Comment: (NOTE) ?The Xpert SA Assay (FDA approved for NASAL specimens in patients 56 ?years of age and older), is one component of a comprehensive ?surveillance program. It is not intended to diagnose infection nor to ?guide or monitor treatment. ?Performed at [facility], [place] [address], [place], [place] ?[address] ?  ?Aerobic/Anaerobic Culture w Gram Stain (surgical/deep wound)     Status: None  ? Collection Time: 03/16/22  2:00 PM  ? Specimen: Skin, Cyst/Tag/Debridement  ?Result Value Ref Range Status  ?  Specimen Description SKIN  Final  ? Special Requests NONE  Final  ? Gram Stain   Final  ?  RARE WBC PRESENT,BOTH PMN AND MONONUCLEAR ?NO ORGANISMS SEEN ?  ? Culture   Final  ?  No growth aerobically or anaerobically. ?Performed at [facility], [place] [address] [place], [place] [address] ?  ? Report Status 03/21/2022 FINAL  Final  ?Aerobic/Anaerobic Culture w Gram Stain (surgical/deep wound)     Status: None (Preliminary result)  ? Collection Time: 03/16/22  3:45 PM  ? Specimen: PATH GU biopsy; Tissue  ?Result Value Ref Range Status  ? Specimen Description SCROTUM  Final  ? Special Requests TISSUE  Final  ? Gram Stain   Final  ?  NO WBC SEEN ?ABUNDANT GRAM POSITIVE COCCI ?FEW GRAM NEGATIVE RODS ?  ? Culture   Final  ?  ABUNDANT ENTEROCOCCUS FAECALIS ?FEW PSEUDOMONAS AERUGINOSA ?MULTI-DRUG RESISTANT ORGANISM ?ABUNDANT BACTEROIDES OVATUS ?BETA LACTAMASE POSITIVE ?CRITICAL RESULT CALLED TO, READ BACK BY AND VERIFIED WITH: PHARMD K PIERCE [id number] AT 1019 BY CM ?Performed at [facility], [place] [address] [place],  [address] ?  ? Report Status PENDING  Incomplete  ? Organism ID, Bacteria ENTEROCOCCUS FAECALIS  Final  ? Organism ID, Bacteria PSEUDOMONAS AERUGINOSA  Final  ?    Susceptibility  ? Enterococcus faecalis - MIC*  ?  AMPICILLIN <=2  SENSITIVE Sensitive   ?  VANCOMYCIN 1 SENSITIVE Sensitive   ?  GENTAMICIN SYNERGY RESISTANT Resistant   ?  * ABUNDANT ENTEROCOCCUS FAECALIS  ? Pseudomonas aeruginosa - MIC*  ?  CEFTAZIDIME 16 INTERMEDIATE Intermediate   ?  CIPROFLOXACIN >=4 RESISTANT Resistant   ?  GENTAMICIN 8 INTERMEDIATE Intermediate   ?  IMIPENEM >=16 RESISTANT Resistant   ?  CEFEPIME RESISTANT Resistant   ?  * FEW PSEUDOMONAS AERUGINOSA  ? ? ?Impression/Plan:  ?1. Gangrenous soft tissue infection - polymicrobial and on Avicaz for the Pseudomonas, piperacillin/tazobactam and metronidazole.  Urology following and may need further debridement.   ?For the Pseudomonas, I will have him continue with  Zerbaxa and stop the Avicaz and ampicillin/sulbactam in place of ampicillin and metronidazole.  ? ?2.  Renal failure - he remains on intermittent hemodialysis and remains dialysis dependent.  ? ?3.  Diarrhea - continuous issue but some improvement.  On tube feeds.  No signs of infection.   ? ?  ?

## 2022-03-23 NOTE — Progress Notes (Signed)
Patient ID: Jeffrey Ayala, male   DOB: Nov 15, 1961, 61 y.o.   MRN: [id number] ? ?7 Days Post-Op ?Subjective: ?Pt less confused and combative. ? ?Objective: ?Vital signs in last 24 hours: ?Temp:  [97.6 ?F (36.4 ?C)-98.2 ?F (36.8 ?C)] 97.7 ?F (36.5 ?C) (05/01 0455) ?Pulse Rate:  [79-105] 85 (05/01 0455) ?Resp:  [18-23] 18 (05/01 0455) ?BP: (113-130)/(66-92) 122/66 (05/01 0455) ?SpO2:  [65 %-100 %] 100 % (05/01 0455) ? ?Intake/Output from previous day: ?04/30 0701 - 05/01 0700 ?In: 303 [P.O.:300; I.V.:3] ?Out: 400 [Urine:400] ?Intake/Output this shift: ?Total I/O ?In: 3 [I.V.:3] ?Out: 400 [Urine:400] ? ?Physical Exam:  ?General: Alert and awake ?GU: Scrotal wound not much changed over the weekend.  Still with extensive fibrinous material over testes and especially in right upper wound.   ? ?Lab Results: ?Recent Labs  ?  03/21/22 ?0119 03/22/22 ?0328  ?HGB 7.1* 7.4*  ?HCT 22.4* 23.1*  ? ? ?  Latest Ref Rng & Units 03/22/2022  ?  3:28 AM 03/21/2022  ?  1:19 AM 03/20/2022  ?  3:22 AM  ?CBC  ?WBC 4.0 - 10.5 K/uL 5.8   7.6   8.4    ?Hemoglobin 13.0 - 17.0 g/dL 7.4   7.1   7.7    ?Hematocrit 39.0 - 52.0 % 23.1   22.4   24.7    ?Platelets 150 - 400 K/uL 217   223   225    ? ? ? ? ?BMET ?Recent Labs  ?  03/22/22 ?0328 03/23/22 ?5027  ?NA 134* 133*  ?K 3.7 3.8  ?CL 96* 96*  ?CO2 22 21*  ?GLUCOSE 100* 109*  ?BUN 80* 96*  ?CREATININE 6.43* 8.13*  ?CALCIUM 8.6* 8.3*  ? ? ? ?Studies/Results: ?No results found. ? ?Assessment/Plan: ?Scrotal wound/infection:  S/P debridement on 4/24.  On appropriate antibiotic therapy for tissue cultures.  He will most likely benefit from further debridement of wound at some point to facilitate further healing although this will require general anesthesia.  Will discuss with primary team about timing. ? ? LOS: 29 days  ? ?[name] ?03/23/2022, 6:41 AM ? ?  ?

## 2022-03-23 NOTE — Plan of Care (Signed)
?  Problem: Education: ?Goal: Knowledge of General Education information will improve ?Description: Including pain rating scale, medication(s)/side effects and non-pharmacologic comfort measures ?03/23/2022 1201 by Delia Chimes, RN ?Outcome: Progressing ?03/23/2022 0916 by Delia Chimes, RN ?Outcome: Progressing ?  ?Problem: Health Behavior/Discharge Planning: ?Goal: Ability to manage health-related needs will improve ?03/23/2022 1201 by Delia Chimes, RN ?Outcome: Progressing ?03/23/2022 0916 by Delia Chimes, RN ?Outcome: Progressing ?  ?Problem: Elimination: ?Goal: Will not experience complications related to bowel motility ?03/23/2022 1201 by Delia Chimes, RN ?Outcome: Progressing ?03/23/2022 0916 by Delia Chimes, RN ?Outcome: Progressing ?Goal: Will not experience complications related to urinary retention ?03/23/2022 1201 by Delia Chimes, RN ?Outcome: Progressing ?03/23/2022 0916 by Delia Chimes, RN ?Outcome: Progressing ?  ?Problem: Coping: ?Goal: Level of anxiety will decrease ?03/23/2022 1201 by Delia Chimes, RN ?Outcome: Progressing ?03/23/2022 0916 by Delia Chimes, RN ?Outcome: Progressing ?  ?Problem: Fluid Volume: ?Goal: Hemodynamic stability will improve ?03/23/2022 1201 by Delia Chimes, RN ?Outcome: Progressing ?03/23/2022 0916 by Delia Chimes, RN ?Outcome: Progressing ?  ?Problem: Clinical Measurements: ?Goal: Diagnostic test results will improve ?03/23/2022 1201 by Delia Chimes, RN ?Outcome: Progressing ?03/23/2022 0916 by Delia Chimes, RN ?Outcome: Progressing ?Goal: Signs and symptoms of infection will decrease ?03/23/2022 1201 by Delia Chimes, RN ?Outcome: Progressing ?03/23/2022 0916 by Delia Chimes, RN ?Outcome: Progressing ?  ?Problem: Respiratory: ?Goal: Ability to maintain a clear airway and adequate ventilation will improve ?03/23/2022 1201 by Delia Chimes, RN ?Outcome: Progressing ?03/23/2022 0916 by Delia Chimes, RN ?Outcome: Progressing ?  ?Problem: Role  Relationship: ?Goal: Method of communication will improve ?03/23/2022 1201 by Delia Chimes, RN ?Outcome: Progressing ?03/23/2022 0916 by Delia Chimes, RN ?Outcome: Progressing ?  ?

## 2022-03-24 DIAGNOSIS — A419 Sepsis, unspecified organism: Secondary | ICD-10-CM | POA: Diagnosis not present

## 2022-03-24 DIAGNOSIS — N179 Acute kidney failure, unspecified: Secondary | ICD-10-CM | POA: Diagnosis not present

## 2022-03-24 DIAGNOSIS — R4182 Altered mental status, unspecified: Secondary | ICD-10-CM | POA: Diagnosis not present

## 2022-03-24 DIAGNOSIS — I96 Gangrene, not elsewhere classified: Secondary | ICD-10-CM | POA: Diagnosis not present

## 2022-03-24 LAB — RENAL FUNCTION PANEL
Albumin: 2 g/dL — ABNORMAL LOW (ref 3.5–5.0)
Anion gap: 14 (ref 5–15)
BUN: 47 mg/dL — ABNORMAL HIGH (ref 8–23)
CO2: 24 mmol/L (ref 22–32)
Calcium: 8.3 mg/dL — ABNORMAL LOW (ref 8.9–10.3)
Chloride: 99 mmol/L (ref 98–111)
Creatinine, Ser: 5.24 mg/dL — ABNORMAL HIGH (ref 0.61–1.24)
GFR, Estimated: 12 mL/min — ABNORMAL LOW (ref >60)
Glucose, Bld: 104 mg/dL — ABNORMAL HIGH (ref 70–99)
Phosphorus: 5.4 mg/dL — ABNORMAL HIGH (ref 2.5–4.6)
Potassium: 3.5 mmol/L (ref 3.5–5.1)
Sodium: 137 mmol/L (ref 135–145)

## 2022-03-24 LAB — GLUCOSE, CAPILLARY
Glucose-Capillary: 104 mg/dL — ABNORMAL HIGH (ref 70–99)
Glucose-Capillary: 104 mg/dL — ABNORMAL HIGH (ref 70–99)
Glucose-Capillary: 123 mg/dL — ABNORMAL HIGH (ref 70–99)
Glucose-Capillary: 126 mg/dL — ABNORMAL HIGH (ref 70–99)
Glucose-Capillary: 115 mg/dL — ABNORMAL HIGH (ref 70–99)

## 2022-03-24 MED ORDER — ENSURE ENLIVE PO LIQD
237.0000 mL | Freq: Two times a day (BID) | ORAL | Status: DC
  Administered 2022-03-24 – 2022-03-26 (×3): 237 mL via ORAL

## 2022-03-24 MED ORDER — PROSOURCE PLUS PO LIQD
30.0000 mL | Freq: Three times a day (TID) | ORAL | Status: DC
  Administered 2022-03-24 – 2022-04-03 (×24): 30 mL via ORAL
  Filled 2022-03-24 (×26): qty 30

## 2022-03-24 MED FILL — Medication: Qty: 1 | Status: AC

## 2022-03-24 NOTE — Progress Notes (Addendum)
? ?PROGRESS NOTE ? ? ? ?Jeffrey Ayala  NTI:[id number] DOB: 06/08/1961 [name]: 02/22/2022 ?PCP: Patient, No Pcp Per (Inactive) ? ? ?Brief Narrative: ?Jeffrey Ayala is a 61 y.o. male with PMH significant for arthritis, bilateral knee replacement, gastric ulcer, kidney stones, eosinophilic esophagitis, migraines not on any regular meds.   ?Patient presented to the ED on 4/2 with complaint of nausea, vomiting, diarrhea and confusion for 24 hours.   ?EMS noted him to be in A-fib with a heart rate of 120-160 bpm. ?In the ED, he had a fever of 102.9. ?CT abdomen pelvis showed swollen scrotum concerning for infection, colon filled with diarrhea.  ?He was hypotensive requiring multiple vasopressors in the ICU.   ?4/3, patient required intubation and also had a brief cardiac arrest postintubation.   ?4/5, in an effort to correct vasoplegia, patient was also started on [name] ?Because of septic shock, patient's renal function worsened. ?4/6, patient was started on CRRT ?4/11, patient was extubated. ?4/14, patient was transferred out to [facility] service. ?4/23, underwent left transmetatarsal amputation and right BKA by [name] ?4/24, scrotum debridement and subsequent ICU readmission ?4/27, transfer to [facility] service ?4/29, scrotal wound culture with E. Faecalis and MDR Pseudomonas aeruginosa; ID consulted and patient transitioned to Ampicillin, Avycaz and Flagyl ?5/1, increased lucidity. ID transitioned antibiotics to Augmentin and Zerbaxa ? ?Assessment and Plan: ? ?Septic shock ?Present on admission. Secondary to scrotal cellulitis. Patient required ICU admission with vasopressor support. Patient's underlying infection was managed with antibiotics with improvement of shock. Now resolved. ? ?Scrotal cellulitis ?Urology consulted and performed debridement of scrotum on 4/24. Wound cultures significant for bacteroides ovatus, enterococcus and pseudomonas. Patient managed on Vancomycin and Cefepime prior to wound  culture sensitivity results. Pseudomonas sensitivities resulted in MDR strain. Per ID, patient transitioned to ampicillin, Avycaz and Flagyl on 4/29 and has now been transitioned to Augmentin PO and Zerbaxa IV ?-ID recommendations: 2 weeks abx duration, possible repeat I&D, ampicillin, Avycaz and Flagyl ? ?Dry gangrene of bilateral feet ?Patient is s/p left TMA and right BKA. Wound vac on left TMA wound. Wound vac in place for left TMA wound. ? ?AKI ?Anuric and now dialysis dependant. Patient was managed on CRRT from 4/6 until 4/11 and has transitioned to intermittent hemodialysis per nephrology service. UOP of 300 mL over the last 24 hours ?-Nephrology recommendations: Continuing intermittent HD ? ?Paroxysmal atrial fibrillation ?New diagnosis. CHA2DS2-VASc Score is 1.Transthoracic Echocardiogram with preserved LVEF. Mostly rate controlled but did have some associated tachycardia while agitated. Initially was managed on heparin IV which was discontinued secondary to anemia. Cardiology consulted. Patient started on metoprolol 37.5 mg TID and aspirin 81 mg.  ?-Continue metoprolol 37.5 mg TID and aspirin 81 mg daily ? ?Acute metabolic encephalopathy ?Delirium ?Unsure of specific etiology. Possibly secondary to critical illness but also complicated by uremia and concern for possible hypoxemic injury while in septic shock. CT head unremarkable for acute process. Patient started on Seroquel for hopeful management with not much improvement. Patient with waxing and waning symptoms. Normal vitamin B1 and B12. Patient found to have likely smoldering infection that might have contributed to persistent mental status issues. ?-Delirium precautions ?-Continue Seroquel BID; once mentation/orientation stabilizes, may be able to discontinue ?-Haldol PRN ? ?Acute anemia ?Baseline hemoglobin of 13 prior to admission. Patient with downward drifting hemoglobin without evidence of active bleeding. Possible combination of chronic disease  and perioperative blood loss. Patient has received 4 units of pRBC to date. Currently stable. ? ?Diarrhea ?  Persistent issue. GI panel negative. Patient is on tube feeds which may be contributing. Rectal tube placed. Seems to be improved. ? ?Shock liver ?Secondary to sepsis. AST/ALT elevations resolved. Bilirubin improved. ? ?Acute respiratory failure with hypoxia ?Patient intubated from 4/3 - 4/10, 4/23 - 4/23, 4/24 - 4/25. Resolved. ? ?Acute gout ?Bilateral elbows and left hand. Patient started on allopurinol and colchicine. Prednisone avoided secondary to concern for impaired wound healing. Colchicine discontinued. ?-Continue allopurinol ? ?Acute pain ?-Continue oxycodone PRN ?-Tylenol scheduled; may be able to switch to PRN now that patient is becoming more oriented ? ?Hyponatremia ?Metabolic acidosis ?Hyperphosphatemia ?Management per nephrology ? ?Demand ischemia ?Significant troponin elevation trending up to 3,550 before checks were ceased. In setting of septic shock and cardiac arrest. Transthoracic Echocardiogram without WMA and with normal LVEF. Per cardiology, likely demand ischemia. ? ?Thrombocytopenia ?Likely secondary to septic shock. Patient received a total of 1 unit of platelets. Thrombocytopenia now resolved. ? ?Poor oral intake ?Moderate malnutrition ?In setting of altered mentation. Dietitian consulted. Small bore NG tube placed on 4/17 and tube feeds initiated. Unfortunately, NG tube mistakenly removed on 4/28. SLP reevaluated on 4/29 with recommendation for dysphagia 3 diet. May need calorie count as patient's intake may not end up being adequate to manage nutrition needs. ?-Hold tube feeds ?-Follow-up updated dietitian recommendations (re-consulted on 5/1) ?-SLP recommendations (4/29): ? Diet recommendations: Dysphagia 3 (mechanical soft);Thin liquid ?Liquids provided via: Cup;Straw ?Medication Administration: Crushed with puree ?Supervision: Staff to assist with self feeding;Full  supervision/cueing for compensatory strategies;Trained caregiver to feed patient ?Compensations: Minimize environmental distractions;Slow rate;Small sips/bites ?Postural Changes and/or Swallow Maneuvers: Seated upright 90 degrees  ? ?-Dietitian recommendations (4/26): ?Continue tube feeds via Cortrak tube: ?Nepro @ 50 ml/hr (1200 ml/day) ?ProSource TF 45 ml QID ?Tube feeding regimen provides 2320 kcal, 141 grams of protein, and 872 ml of H2O. ?Continue renal MVI daily per tube ?Continue 1 packet Juven BID per tube, each packet provides 95 calories, 2.5 grams of protein, and 9.8 grams of carbohydrate; also contains 7 grams of L-arginine and L-glutamine, 300 mg vitamin C, 15 mg vitamin E, 1.2 mcg vitamin B-12, 9.5 mg zinc, 200 mg calcium, and 1.5 g  Calcium Beta-hydroxy-Beta-methylbutyrate to support wound healing ?RD will monitor for diet advancement and add oral nutrition supplements as appropriate ? ?Obesity ?Body mass index is 42.18 kg/m?. ? ?Pressure injury ?Medial coccyx. POA. ? ? ?DVT prophylaxis: Heparin subq ?Code Status:   Code Status: Full Code ?Family Communication: None at bedside ?Disposition Plan: Discharge pending improvement of mental status and advancement of diet in addition to antibiotic regimen/urology management, therapy placement ? ? ?Consultants:  ?PCCM ?Urology ?Orthopedic surgery ?Vascular surgery ?Cardiology ?Nephrology ?Infectious disease ? ?Procedures:  ?LEFT TRANSMETATARSAL AMPUTATION, RIGHT BELOW KNEE AMPUTATION (4/23) ?Debridement of scrotum (4/24) ? ?Antimicrobials: ?Zerbaxa ?Augmentin ? ? ?Subjective: ?Patient reports no concerns at this time. No scrotal pain. ? ?Objective: ?BP 134/68 (BP Location: Left Arm)   Pulse 88   Temp 98 ?F (36.7 ?C) (Oral)   Resp 17   Ht '5\' 10"'$  (1.778 m)   Wt 133.4 kg   SpO2 97%   BMI 42.18 kg/m?  ? ?Examination: ? ?General exam: Appears calm and comfortable ?Respiratory system: Clear to auscultation. Respiratory effort normal. ?Cardiovascular system:  S1 & S2 heard, RRR. ?Gastrointestinal system: Abdomen is slightly distended, soft and nontender. Decreased bowel sounds heard. ?Central nervous system: Alert. Follows commands ?Musculoskeletal: No calf tenderness. Right TMA a

## 2022-03-24 NOTE — Progress Notes (Signed)
Speech Language Pathology Treatment: Dysphagia  ?Patient Details ?Name: Jeffrey Ayala ?MRN: [id number] ?DOB: 22-Nov-1961 ?Today's Date: 03/24/2022 ?Time: 3536-1443 ?SLP Time Calculation (min) (ACUTE ONLY): 7 min ? ?Assessment / Plan / Recommendation ?Clinical Impression ? Pt seen for ongoing dysphagia management.  Mechanical soft diet initiated 4/29.  RN reports good tolerance, but that pt requires cuing and assistance.  Per OT pt had some outside food recently ([place]).  Today pt tolerated thin liquid by cup and straw, puree, and regular texture solids with no clinical s/s of aspiration.  Pt did require consistent verbal cuing for bolus acceptance and there was some anterior loss.  This appears to be 2/2 inattention.  Pt seems safe to continue current diet.  SLP to follow for tolerance and possible upgrade as pt becomes more attentive ? ?Recommend mechanical soft diet with thin liquid.  ?  ?HPI HPI: 61 y.o. male presented to the [facility] ED with a chief complaint of nausea, vomiting, diarrhea. Dx septic shock (sources could be scrotal cellulitis versus right lower lobe aspiration pneumonia), afib, acute hypoxemic resp failure requiring intubation 4/3-4/10, AKI from septic shock, metabolic encephalopathy. BSE 4/10 full liquids (pt choice), BSE 4/17 recommended regular texture, thin liquids, On 4/23 pt underwent amputation of left transmetatarsal then 4/24 underwent debridement of scrotum. ?  ?   ?SLP Plan ? Continue with current plan of care ? ?  ?  ?Recommendations for follow up therapy are one component of a multi-disciplinary discharge planning process, led by the attending physician.  Recommendations may be updated based on patient status, additional functional criteria and insurance authorization. ?  ? ?Recommendations  ?Liquids provided via: Cup;Straw ?Medication Administration: Crushed with puree ?Supervision: Staff to assist with self feeding;Full supervision/cueing for compensatory strategies;Trained  caregiver to feed patient ?Compensations: Minimize environmental distractions;Slow rate;Small sips/bites ?Postural Changes and/or Swallow Maneuvers: Seated upright 90 degrees  ?   ?    ?   ? ? ? ? Oral Care Recommendations: Oral care BID ?Follow Up Recommendations: Acute inpatient rehab (3hours/day) ?Assistance recommended at discharge: Frequent or constant Supervision/Assistance ?SLP Visit Diagnosis: Dysphagia, unspecified (R13.10) ?Plan: Continue with current plan of care ? ? ? ? ?  ?  ? ? ?T[place]umseh Yeagley E Griffith Santilli, MA, CCC-SLP ?Acute Rehabilitation Services ?Office: [phone] ?03/24/2022, 1:15 PM ?

## 2022-03-24 NOTE — Progress Notes (Signed)
Physical Therapy Treatment ?Patient Details ?Name: Jeffrey Ayala ?MRN: [id number] ?DOB: 01-26-1961 ?Today's Date: 03/24/2022 ? ? ?History of Present Illness Pt adm 4/2 c/o N/V/D and found to have septic shock due to scrotal cellulitis vs RLL PNA.  Pt intubated 4/3 and brief cardiac arrest post intubation. [name] started 4/5.  Pt with AKI and started on CRRT on 4/6. Pt extubated 4/10. Due to gangrene of B LE, pt underwent R BKA and L transmet amputations on 4/23. On 4/24, pt underwent I&D of scrotum due to dry gangrene. Pt also with afib with rvr. PMH - Bil TKR, migraines ? ?  ?PT Comments  ? ? Pt making good progress towards his physical therapy goals today. He is alert and conversational. Demonstrates delayed processing and requires multimodal cues for initiating and executing tasks. Pt able to participate in therapeutic exercises for BLE strengthening, bed mobility and pre transfer training. Pt requiring min guard-modA for bed mobility. Progressed to long sitting position x 2 and then transitioned to edge of bed with assist of hips using bed pad. Has difficulty comprehending concept for anterior posterior transfer, but this will be a good goal to continue to work towards. Suspect steady progress given age, PLOF and family support. Recommend AIR to address deficits, maximize functional mobility and decrease caregiver burden.  ?   ?Recommendations for follow up therapy are one component of a multi-disciplinary discharge planning process, led by the attending physician.  Recommendations may be updated based on patient status, additional functional criteria and insurance authorization. ? ?Follow Up Recommendations ? Acute inpatient rehab (3hours/day) ?  ?  ?Assistance Recommended at Discharge Frequent or constant Supervision/Assistance  ?Patient can return home with the following Two people to help with walking and/or transfers;Two people to help with bathing/dressing/bathroom;Assistance with  cooking/housework;Assistance with feeding;Direct supervision/assist for medications management;Direct supervision/assist for financial management;Assist for transportation;Help with stairs or ramp for entrance ?  ?Equipment Recommendations ? Wheelchair (measurements PT);Wheelchair cushion (measurements PT);Hospital bed;Other (comment) (hoyer lift)  ?  ?Recommendations for Other Services   ? ? ?  ?Precautions / Restrictions Precautions ?Precautions: Fall;Other (comment) ?Precaution Comments: B LE wound vacs ?Restrictions ?Weight Bearing Restrictions: Yes ?RLE Weight Bearing: Non weight bearing ?LLE Weight Bearing: Non weight bearing  ?  ? ?Mobility ? Bed Mobility ?Overal bed mobility: Needs Assistance ?Bed Mobility: Rolling ?Rolling: Min guard ?  ?Supine to sit: Min guard, Mod assist ?Sit to supine: Min assist ?  ?General bed mobility comments: Pt rolling to R/L with min guard assist. Good initiation wtih rolling towards left; increased time for processing for rolling towards right. Pt progressing to long sitting position and use of bed rail min guard assist on first trial. On second trial, requiring modA for trunk to upright (likely impacted by cognition), use of bed pad to pivot hips towards right. Max multimodal cues for scooting backwards for simulated AP transfer, however, pt with difficulty comprehending. Returned to supine with minA ?  ? ?Transfers ?  ?  ?  ?  ?  ?  ?  ?  ?  ?  ?  ? ?Ambulation/Gait ?  ?  ?  ?  ?  ?  ?  ?  ? ? ?Stairs ?  ?  ?  ?  ?  ? ? ?Wheelchair Mobility ?  ? ?Modified Rankin (Stroke Patients Only) ?  ? ? ?  ?Balance Overall balance assessment: Needs assistance ?Sitting-balance support: Bilateral upper extremity supported, Feet unsupported ?Sitting balance-Leahy Scale: Fair ?Sitting balance -  Comments: supervision-min guard assist ?  ?  ?  ?  ?  ?  ?  ?  ?  ?  ?  ?  ?  ?  ?  ?  ? ?  ?Cognition Arousal/Alertness: Awake/alert ?Behavior During Therapy: Flat affect ?Overall Cognitive Status:  Impaired/Different from baseline ?Area of Impairment: Attention, Memory, Following commands, Safety/judgement, Problem solving, Awareness, Orientation ?  ?  ?  ?  ?  ?  ?  ?  ?Orientation Level: Place, Time, Situation ?Current Attention Level: Sustained ?Memory: Decreased recall of precautions, Decreased short-term memory ?Following Commands: Follows one step commands inconsistently, Follows one step commands with increased time ?Safety/Judgement: Decreased awareness of safety, Decreased awareness of deficits ?Awareness: Intellectual ?Problem Solving: Slow processing, Decreased initiation, Difficulty sequencing, Requires verbal cues, Requires tactile cues ?General Comments: Pt alert and conversational. Needs max cues for locating calendar on wall. Delayed processing; requires multimodal cueing for tasks for comprehension ?  ?  ? ?  ?Exercises Amputee Exercises ?Quad Sets: Right, 10 reps, Supine ?Hip ABduction/ADduction: Left, 10 reps, Supine ?Knee Extension: Both, 5 reps, Supine, Seated, 10 reps (Supine heel slides x 10 reps, Seated LAQs x 5 reps) ?Straight Leg Raises: AAROM, Both, 10 reps, Supine ? ?  ?General Comments General comments (skin integrity, edema, etc.): Wife present and supportive. continued to encourage optimal LE positioning in bed with L LE tendency for external rotation (easily stretched/corrected though) ?  ?  ? ?Pertinent Vitals/Pain Pain Assessment ?Pain Assessment: Faces ?Faces Pain Scale: No hurt  ? ? ?Home Living   ?  ?  ?  ?  ?  ?  ?  ?  ?  ?   ?  ?Prior Function    ?  ?  ?   ? ?PT Goals (current goals can now be found in the care plan section) Acute Rehab PT Goals ?Patient Stated Goal: pt wife would like him to sit up ?Potential to Achieve Goals: Fair ?Progress towards PT goals: Progressing toward goals ? ?  ?Frequency ? ? ? Min 3X/week ? ? ? ?  ?PT Plan Current plan remains appropriate  ? ? ?Co-evaluation   ?  ?  ?  ?  ? ?  ?AM-PAC PT "6 Clicks" Mobility   ?Outcome Measure ? Help needed  turning from your back to your side while in a flat bed without using bedrails?: A Little ?Help needed moving from lying on your back to sitting on the side of a flat bed without using bedrails?: A Lot ?Help needed moving to and from a bed to a chair (including a wheelchair)?: Total ?Help needed standing up from a chair using your arms (e.g., wheelchair or bedside chair)?: Total ?Help needed to walk in hospital room?: Total ?Help needed climbing 3-5 steps with a railing? : Total ?6 Click Score: 9 ? ?  ?End of Session   ?Activity Tolerance: Patient tolerated treatment well ?Patient left: in bed;with call bell/phone within reach;with family/visitor present ?Nurse Communication: Mobility status ?PT Visit Diagnosis: Other abnormalities of gait and mobility (R26.89);Muscle weakness (generalized) (M62.81) ?  ? ? ?Time: 4196-2229 ?PT Time Calculation (min) (ACUTE ONLY): 45 min ? ?Charges:  $Therapeutic Exercise: 8-22 mins ?$Therapeutic Activity: 23-37 mins          ?          ? ?Wyona Almas, PT, DPT ?Acute Rehabilitation Services ?Pager [phone] ?Office [phone] ? ? ? ?Deno Etienne ?03/24/2022, 3:25 PM ? ?

## 2022-03-24 NOTE — Progress Notes (Signed)
Occupational Therapy Treatment ?Patient Details ?Name: Jeffrey Ayala ?MRN: [id number] ?DOB: [birth year] ?Today's Date: 03/24/2022 ? ? ?History of present illness Pt adm 4/2 c/o N/V/D and found to have septic shock due to scrotal cellulitis vs RLL PNA.  Pt intubated 4/3 and brief cardiac arrest post intubation. [name] started 4/5.  Pt with AKI and started on CRRT on 4/6. Pt extubated 4/10. Due to gangrene of B LE, pt underwent R BKA and L transmet amputations on 4/23. On 4/24, pt underwent I&D of scrotum due to dry gangrene. Pt also with afib with rvr. PMH - Bil TKR, migraines ?  ?OT comments ? Pt with improved alertness and command following in comparison to previous OT session. However, deficits in attention and processing time hinder ability to complete daily tasks without significant assistance. Pt's wife at bedside and supportive. Session focused on sequencing, motor planning and trunk strength with ADL completion in long sitting (fair balance > 7 min). Attempted to engage in basic UE exercises though pt drifting off to sleep by end of session. Pending continued activity tolerance and cognition improvements at acute level, pt may benefit from AIR level therapies once medically stable.  ? ?Recommendations for follow up therapy are one component of a multi-disciplinary discharge planning process, led by the attending physician.  Recommendations may be updated based on patient status, additional functional criteria and insurance authorization. ?   ?Follow Up Recommendations ? Acute inpatient rehab (3hours/day) (pending activity tolerance improvements)  ?  ?Assistance Recommended at Discharge Frequent or constant Supervision/Assistance  ?Patient can return home with the following ? Two people to help with walking and/or transfers;Two people to help with bathing/dressing/bathroom;Assistance with cooking/housework;Assistance with feeding;Direct supervision/assist for medications management;Direct  supervision/assist for financial management;Assist for transportation;Help with stairs or ramp for entrance ?  ?Equipment Recommendations ? Wheelchair (measurements OT);Wheelchair cushion (measurements OT);Hospital bed  ?  ?Recommendations for Other Services Rehab consult ? ?  ?Precautions / Restrictions Precautions ?Precautions: Fall;Other (comment) ?Precaution Comments: B LE wound vacs ?Restrictions ?Weight Bearing Restrictions: Yes ?RLE Weight Bearing: Non weight bearing ?LLE Weight Bearing: Non weight bearing  ? ? ?  ? ?Mobility Bed Mobility ?Overal bed mobility: Needs Assistance ?  ?  ?  ?  ?  ?  ?General bed mobility comments: Mod A x 2 to scoot up in bed, able to attempt assisting by pulling on bedrails. Guided pt in coming to long sitting with initially Mod A pulling on B bedrails though pt able to sustain > 7 min during tasks ?  ? ?Transfers ?  ?  ?  ?  ?  ?  ?  ?  ?  ?  ?  ?  ?Balance   ?  ?  ?  ?  ?  ?  ?  ?  ?  ?  ?  ?  ?  ?  ?  ?  ?  ?  ?   ? ?ADL either performed or assessed with clinical judgement  ? ?ADL Overall ADL's : Needs assistance/impaired ?  ?  ?Grooming: Moderate assistance;Bed level ?  ?Upper Body Bathing: Maximal assistance;Bed level ?Upper Body Bathing Details (indicate cue type and reason): able to hold self in long sitting for assist to bathe back ?Lower Body Bathing: Maximal assistance;Bed level ?Lower Body Bathing Details (indicate cue type and reason): able to initiate applying lotion to L LE with long sitting bed level though difficulty sustaining attention to task. ?  ?  ?  ?  ?  ?  ?  ?  ?  ?  ?  ?  General ADL Comments: Emphasis on command following, basic task participation with bed mobility, long sitting for trunk strengthening during basic tasks. Poor engagement in UE exercises (squeeze ball and yellow t band provided). Educated wife on strategies that can be trialed outside of therapy to further engage pt in daily tasks. Wife reports pt able to reach and hold to cup with B hands  - encouraged trial of cup with lid/handle , as well as having cup half full for less weight to hold ?  ? ?Extremity/Trunk Assessment Upper Extremity Assessment ?Upper Extremity Assessment: RUE deficits/detail ?RUE Deficits / Details: cyst on elbow. wife reports this is baseline. however, pt with noted grimacing with elbow flexion and unable to reach to face. Pt's wife also reports likely need for shoulder replacement though shoulder AROM WFL ?  ?Lower Extremity Assessment ?Lower Extremity Assessment: Defer to PT evaluation ?  ?  ?  ? ?Vision   ?Vision Assessment?: No apparent visual deficits ?  ?Perception   ?  ?Praxis   ?  ? ?Cognition Arousal/Alertness: Awake/alert, Lethargic (intermittent falling asleep) ?Behavior During Therapy: Flat affect ?Overall Cognitive Status: Impaired/Different from baseline ?Area of Impairment: Attention, Memory, Following commands, Safety/judgement, Problem solving, Awareness, Orientation ?  ?  ?  ?  ?  ?  ?  ?  ?Orientation Level: Place, Time, Situation ?Current Attention Level: Sustained ?Memory: Decreased recall of precautions, Decreased short-term memory ?Following Commands: Follows one step commands inconsistently, Follows one step commands with increased time ?Safety/Judgement: Decreased awareness of safety, Decreased awareness of deficits ?Awareness: Intellectual ?Problem Solving: Slow processing, Decreased initiation, Difficulty sequencing, Requires verbal cues, Requires tactile cues ?General Comments: varied from alert to falling asleep intermittently but easily awakened. Very slow processing time, inconsistent command following and poor initiation of tasks ?  ?  ?   ?Exercises   ? ?  ?Shoulder Instructions   ? ? ?  ?General Comments Wife present and supportive. continued to encourage optimal LE positioning in bed with L LE tendency for external rotation (easily stretched/corrected though)  ? ? ?Pertinent Vitals/ Pain       Pain Assessment ?Pain Assessment: No/denies  pain ?Pain Intervention(s): Monitored during session ? ?Home Living   ?  ?  ?  ?  ?  ?  ?  ?  ?  ?  ?  ?  ?  ?  ?  ?  ?  ?  ? ?  ?Prior Functioning/Environment    ?  ?  ?  ?   ? ?Frequency ? Min 2X/week  ? ? ? ? ?  ?Progress Toward Goals ? ?OT Goals(current goals can now be found in the care plan section) ? Progress towards OT goals: Progressing toward goals ? ?Acute Rehab OT Goals ?Patient Stated Goal: return to independence ?OT Goal Formulation: With patient ?Time For Goal Achievement: 04/01/22 ?Potential to Achieve Goals: Good ?ADL Goals ?Pt Will Perform Grooming: with min assist;sitting;bed level ?Pt Will Perform Upper Body Bathing: with mod assist;sitting ?Pt Will Transfer to Toilet: with max assist;with +2 assist;with transfer board ?Pt/caregiver will Perform Home Exercise Program: Increased strength;Both right and left upper extremity;With written HEP provided ?Additional ADL Goal #1: Pt will attend to and complete ADL tasks > 3 min with no more than min cueing.  ?Plan Discharge plan remains appropriate;Frequency remains appropriate   ? ?Co-evaluation ? ? ?   ?  ?  ?  ?  ? ?  ?AM-PAC OT "6 Clicks" Daily Activity     ?Outcome  Measure ? ? Help from another person eating meals?: A Lot ?Help from another person taking care of personal grooming?: A Lot ?Help from another person toileting, which includes using toliet, bedpan, or urinal?: Total ?Help from another person bathing (including washing, rinsing, drying)?: A Lot ?Help from another person to put on and taking off regular upper body clothing?: A Lot ?Help from another person to put on and taking off regular lower body clothing?: Total ?6 Click Score: 10 ? ?  ?End of Session   ? ?OT Visit Diagnosis: Other abnormalities of gait and mobility (R26.89);Muscle weakness (generalized) (M62.81) ?  ?Activity Tolerance Patient tolerated treatment well ?  ?Patient Left in bed;with call bell/phone within reach;with family/visitor present ?  ?Nurse Communication Mobility  status (discussed with NT) ?  ? ?   ? ?Time: 1937-9024 ?OT Time Calculation (min): 27 min ? ?Charges: OT General Charges ?$OT Visit: 1 Visit ?OT Treatments ?$Self Care/Home Management : 8-22 mins ?$Therapeutic Activity: 8-2

## 2022-03-24 NOTE — Progress Notes (Signed)
Patient ID: Jeffrey Ayala, male   DOB: [birth year], 61 y.o.   MRN: [id number] ?[facility] ?Progress Note  ? ?Assessment/ Plan:   ?1. Acute kidney Injury: Etiology suspected to be ATN from severe septic shock in the setting of foot wounds.  Anuric and dialysis dependent since 02/26/2022 (initially started with CRRT and transition to IHD).  Underwent hemodialysis yesterday and overnight oliguric with 300 cc urine output.  Will order for hemodialysis again tomorrow (MWF schedule while here) as he does not have any clear indication of renal recovery. ?2.  Scrotal edema/wound: Status post surgical debridement by urology on 4/24 and per urology note yesterday, wound still has extensive fibrinous material over testes especially over right upper wound with high likelihood of needing further debridement under general anesthesia.  He is on Augmentin and Zerbaxa for antimicrobial coverage. ?3.  Leg wound status post left TMA and right below-knee amputation on 03/15/2022: Septic shock resolved and remains on antimicrobial coverage. ?4.  Anemia: With relatively stable hemoglobin and hematocrit, continue dosing ESA. ?5.  Hyperphosphatemia: Secondary to acute kidney injury and improving trend with ongoing binders/hemodialysis ? ?Subjective:   ?Without acute events overnight, spoke to/updated wife at bedside.  ? ?Objective:   ?BP 134/68 (BP Location: Left Arm)   Pulse 88   Temp 98 ?F (36.7 ?C) (Oral)   Resp 17   Ht '5\' 10"'$  (1.778 m)   Wt 133.4 kg   SpO2 97%   BMI 42.18 kg/m?  ? ?Intake/Output Summary (Last 24 hours) at 03/24/2022 1016 ?Last data filed at 03/24/2022 0537 ?Gross per 24 hour  ?Intake 302 ml  ?Output 1800 ml  ?Net -1498 ml  ? ?Weight change:  ? ?Physical Exam: ?Gen: Comfortably resting in bed, listening to television ?CVS: Pulse regular rhythm, normal rate, S1 and S2 normal ?Resp: Clear to auscultation bilaterally, no distinct rales or rhonchi ?Abd: Soft, obese, nontender, bowel sounds normal ?Ext:  Status post left TMA and right BKA with wound vacs in place ? ?Imaging: ?No results found. ? ?Labs: ?BMET ?Recent Labs  ?Lab 03/18/22 ?0602 03/19/22 ?0154 03/20/22 ?8144 03/21/22 ?8185 03/22/22 ?6314 03/23/22 ?9702 03/24/22 ?0159  ?NA 133* 131* 129* 133* 134* 133* 137  ?K 4.6 3.7 4.1 3.4* 3.7 3.8 3.5  ?CL 98 95* 95* 98 96* 96* 99  ?CO2 21* 22 20* 24 22 21* 24  ?GLUCOSE 122* 141* 132* 108* 100* 109* 104*  ?BUN 110* 72* 109* 56* 80* 96* 47*  ?CREATININE 8.42* 5.35* 7.16* 4.63* 6.43* 8.13* 5.24*  ?CALCIUM 8.1* 8.2* 8.4* 8.3* 8.6* 8.3* 8.3*  ?PHOS 8.3* 5.1* 6.5* 4.9* 6.6* 7.8* 5.4*  ? ?CBC ?Recent Labs  ?Lab 03/19/22 ?0154 03/20/22 ?0322 03/21/22 ?0119 03/22/22 ?6378  ?WBC 9.5 8.4 7.6 5.8  ?HGB 8.0* 7.7* 7.1* 7.4*  ?HCT 25.1* 24.7* 22.4* 23.1*  ?MCV 91.3 92.5 91.4 92.0  ?PLT 235 225 223 217  ? ? ?Medications:   ? ? sodium chloride   Intravenous Once  ? acetaminophen  650 mg Oral Q6H  ? allopurinol  100 mg Oral Daily  ? amoxicillin-clavulanate  1 tablet Oral BID  ? vitamin C  1,000 mg Oral Daily  ? aspirin  81 mg Oral Daily  ? Chlorhexidine Gluconate Cloth  6 each Topical Q0600  ? darbepoetin (ARANESP) injection - DIALYSIS  200 mcg Intravenous Q Mon-HD  ? feeding supplement (PROSource TF)  45 mL Per Tube QID  ? fiber  1 packet Oral BID  ? folic acid  1 mg  Oral Daily  ? guaiFENesin  400 mg Per Tube Q8H  ? heparin injection (subcutaneous)  5,000 Units Subcutaneous Q8H  ? insulin aspart  0-15 Units Subcutaneous Q4H  ? leptospermum manuka honey  1 application. Topical Daily  ? melatonin  3 mg Oral QHS  ? metoprolol tartrate  37.5 mg Oral TID  ? multivitamin  1 tablet Oral QHS  ? nutrition supplement (JUVEN)  1 packet Oral BID BM  ? oxyCODONE  5 mg Per Tube Once  ? pantoprazole sodium  40 mg Per Tube Daily  ? QUEtiapine  25 mg Oral Daily  ? QUEtiapine  50 mg Oral QHS  ? sevelamer carbonate  2.4 g Oral Q8H  ? sodium chloride flush  3 mL Intravenous Q12H  ? sodium hypochlorite   Irrigation BID  ? zinc sulfate  220 mg Oral Daily   ? ?Elmarie Shiley, MD ?03/24/2022, 10:16 AM  ? ?

## 2022-03-24 NOTE — Progress Notes (Signed)
Nutrition Follow-up ? ?DOCUMENTATION CODES:  ? ?Non-severe (moderate) malnutrition in context of acute illness/injury ? ?INTERVENTION:  ?- Initiate 48 hour calorie count ? ?- 1 packet Juven BID, each packet provides 95 calories, 2.5 grams of protein (collagen), and 9.8 grams of carbohydrate (3 grams sugar) + vitamins and minerals to support wound healing ? ?- 30 ml ProSource Plus TID, each supplement provides 100 kcals and 15 grams protein.  ? ?- Ensure Enlive po BID, each supplement provides 350 kcal and 20 grams of protein. ? ?- Renal MVI daily ? ?- Will continue to provide diet education as appropriate at follow up ? ?NUTRITION DIAGNOSIS:  ? ?Moderate Malnutrition related to acute illness (septic shock, A fib, AKI) as evidenced by mild fat depletion, mild muscle depletion, moderate muscle depletion. ? ?Ongoing  ? ?GOAL:  ? ?Patient will meet greater than or equal to 90% of their needs ? ?Not being met- addressing needs via dysphagia diet and addition of nutrition supplements ? ?MONITOR:  ? ?PO intake, Supplement acceptance, Labs, Skin ? ?REASON FOR ASSESSMENT:  ? ?Consult ?Diet education, Calorie Count (renal failure/carb modified) ? ?ASSESSMENT:  ? ?61 year old male who presented to the ED on 4/02 with N/V/D. PMH of HTN, PUD, eosinophilic esophagitis, kidney stones. Pt admitted with septic shock, new onset atrial fibrillation, AKI. ? ?04/03 - intubated, brief cardiac arrest ?04/06 - CRRT initiated ?04/10 - extubated, diet advanced to full liquids ?04/11 - diet advanced to regular ?04/17 - Cortrak placed, tube feeds started, s/p tunneled HD catheter ?04/23 - NPO at midnight, s/p L transmetatarsal amputation and R BKA ?04/24 - s/p debridement of dry gangrene of the anterior scrotum, remained intubated post-op ?04/25 - extubated, TF resumed  ?04/28- NG tube removed ?04/29- diet advanced to dysphagia 3 ? ?Per urology, possible need for further debridement of scrotal wound. ? ?Consult received for calorie count and  diet ed. RD working off site today. Unsuccessful attempts to reach pt and wife via phone x2. Calorie count ordered to begin today. Will add nutrition supplements to be included in calorie count and follow up with pt as appropriate.  ? ?Meal completions: ?04/29: 0-25% x 3 meals ?04/30: 10-50% x 3 meals ?05/01: 100% x 1 meal ?05/02: 100%-breakfast ? ?Admit weight 104.3 kg ?Current weight: 133.4 kg  ? ?iHD 05/01: Net UF 1569ml ? ?Generalized edema present ? ?Medications: vitamin c $RemoveB'1000mg'UIiOhCvO$  daily, nutrisource fiber BID, folvite, SSI, medihoney, melatonin, Juven BID, protonix, renvela, zinc 220 mg BID ? ?Labs: BUN 47, Cr 5.24, phos 5.4, GFR 12, CBG's 104-12 x 24 hours ? ?I/O's: +3558 ml since admission ? ?Diet Order:   ?Diet Order   ? ?       ?  DIET DYS 3 Room service appropriate? Yes; Fluid consistency: Thin  Diet effective now       ?  ? ?  ?  ? ?  ? ? ?EDUCATION NEEDS:  ? ?No education needs have been identified at this time ? ?Skin:  Skin Assessment: Skin Integrity Issues: ?Skin Integrity Issues:: Stage II, Other (Comment), Wound VAC, Incisions ?Stage II: coccyx ?Wound Vac: R leg, L leg ?Incisions: scrotum ?Other: skin tear L thigh and R elbow ? ?Last BM:  03/18/22 multiple type 7 ? ?Height:  ? ?Ht Readings from Last 1 Encounters:  ?03/15/22 $RemoveBe'5\' 10"'NpAlRokpc$  (1.778 m)  ? ? ?Weight:  ? ?Wt Readings from Last 1 Encounters:  ?03/24/22 133.4 kg  ? ? ?Ideal Body Weight:  70.6 kg (adj for R BKA) ? ?  BMI:  Body mass index is 45.14 kg/m?. (Adj BMI) ? ?Estimated Nutritional Needs:  ? ?Kcal:  2300-2500 ? ?Protein:  150-170 grams ? ?Fluid:  >/= 2.0 L ? ?Clayborne Dana, RDN, LDN ?Clinical Nutrition ?

## 2022-03-24 NOTE — Progress Notes (Signed)
Inpatient Rehabilitation Admissions Coordinator  ? ?I await further PT and OT  therapy participation to assist with planning rehab options. Last PT was 4/27 and OT was 4/26. Cognition greatly affecting abilities. I am hopeful that he will progress to be able to participate at CIR level. ? ?Danne Baxter, RN, MSN ?Rehab Admissions Coordinator ?[phone] ?03/24/2022 11:39 AM ? ?

## 2022-03-25 DIAGNOSIS — M109 Gout, unspecified: Secondary | ICD-10-CM

## 2022-03-25 DIAGNOSIS — K7200 Acute and subacute hepatic failure without coma: Secondary | ICD-10-CM

## 2022-03-25 DIAGNOSIS — R197 Diarrhea, unspecified: Secondary | ICD-10-CM

## 2022-03-25 DIAGNOSIS — R519 Headache, unspecified: Secondary | ICD-10-CM

## 2022-03-25 DIAGNOSIS — D649 Anemia, unspecified: Secondary | ICD-10-CM

## 2022-03-25 DIAGNOSIS — R52 Pain, unspecified: Secondary | ICD-10-CM

## 2022-03-25 DIAGNOSIS — A419 Sepsis, unspecified organism: Secondary | ICD-10-CM | POA: Diagnosis not present

## 2022-03-25 DIAGNOSIS — D696 Thrombocytopenia, unspecified: Secondary | ICD-10-CM

## 2022-03-25 DIAGNOSIS — J9601 Acute respiratory failure with hypoxia: Secondary | ICD-10-CM

## 2022-03-25 DIAGNOSIS — N492 Inflammatory disorders of scrotum: Secondary | ICD-10-CM

## 2022-03-25 DIAGNOSIS — R6521 Severe sepsis with septic shock: Secondary | ICD-10-CM | POA: Diagnosis not present

## 2022-03-25 DIAGNOSIS — I248 Other forms of acute ischemic heart disease: Secondary | ICD-10-CM

## 2022-03-25 DIAGNOSIS — I469 Cardiac arrest, cause unspecified: Secondary | ICD-10-CM

## 2022-03-25 HISTORY — DX: Headache, unspecified: R51.9

## 2022-03-25 LAB — HEPATITIS B SURFACE ANTIGEN: Hepatitis B Surface Ag: NONREACTIVE

## 2022-03-25 LAB — RENAL FUNCTION PANEL
Albumin: 2.3 g/dL — ABNORMAL LOW (ref 3.5–5.0)
Anion gap: 14 (ref 5–15)
BUN: 66 mg/dL — ABNORMAL HIGH (ref 8–23)
CO2: 22 mmol/L (ref 22–32)
Calcium: 8.4 mg/dL — ABNORMAL LOW (ref 8.9–10.3)
Chloride: 102 mmol/L (ref 98–111)
Creatinine, Ser: 7.08 mg/dL — ABNORMAL HIGH (ref 0.61–1.24)
GFR, Estimated: 8 mL/min — ABNORMAL LOW (ref >60)
Glucose, Bld: 107 mg/dL — ABNORMAL HIGH (ref 70–99)
Phosphorus: 7.4 mg/dL — ABNORMAL HIGH (ref 2.5–4.6)
Potassium: 3.9 mmol/L (ref 3.5–5.1)
Sodium: 138 mmol/L (ref 135–145)

## 2022-03-25 LAB — CBC
HCT: 22.4 % — ABNORMAL LOW (ref 39.0–52.0)
Hemoglobin: 7.2 g/dL — ABNORMAL LOW (ref 13.0–17.0)
MCH: 29.8 pg (ref 26.0–34.0)
MCHC: 32.1 g/dL (ref 30.0–36.0)
MCV: 92.6 fL (ref 80.0–100.0)
Platelets: 215 10*3/uL (ref 150–400)
RBC: 2.42 MIL/uL — ABNORMAL LOW (ref 4.22–5.81)
RDW: 15.5 % (ref 11.5–15.5)
WBC: 5.9 10*3/uL (ref 4.0–10.5)
nRBC: 0 % (ref 0.0–0.2)

## 2022-03-25 LAB — GLUCOSE, CAPILLARY
Glucose-Capillary: 105 mg/dL — ABNORMAL HIGH (ref 70–99)
Glucose-Capillary: 117 mg/dL — ABNORMAL HIGH (ref 70–99)
Glucose-Capillary: 142 mg/dL — ABNORMAL HIGH (ref 70–99)
Glucose-Capillary: 154 mg/dL — ABNORMAL HIGH (ref 70–99)
Glucose-Capillary: 90 mg/dL (ref 70–99)

## 2022-03-25 MED ORDER — HEPARIN SODIUM (PORCINE) 1000 UNIT/ML DIALYSIS
40.0000 [IU]/kg | INTRAMUSCULAR | Status: DC | PRN
  Administered 2022-03-25: 5300 [IU] via INTRAVENOUS_CENTRAL
  Filled 2022-03-25: qty 6

## 2022-03-25 NOTE — Assessment & Plan Note (Signed)
improved

## 2022-03-25 NOTE — Progress Notes (Signed)
?PROGRESS NOTE ? ? ? ?Jeffrey Ayala  JXB:[id number] DOB: 09/16/1961 [name]: 02/22/2022 ?PCP: Patient, No Pcp Per (Inactive)  ?Chief Complaint  ?Patient presents with  ? Atrial Fibrillation  ? Nausea  ? ? ?Brief Narrative:  ?Jeffrey Ayala is Jeffrey Ayala 61 y.o. male with PMH significant for arthritis, bilateral knee replacement, gastric ulcer, kidney stones, eosinophilic esophagitis, migraines not on any regular meds.   ?Patient presented to the ED on 4/2 with complaint of nausea, vomiting, diarrhea and confusion for 24 hours.   ?EMS noted him to be in Jeffrey Ayala-fib with Jeffrey Ayala heart rate of 120-160 bpm. ?In the ED, he had Jeffrey Ayala fever of 102.9. ?CT abdomen pelvis showed swollen scrotum concerning for infection, colon filled with diarrhea.  ?He was hypotensive requiring multiple vasopressors in the ICU.   ?4/3, patient required intubation and also had Jeffrey Ayala brief cardiac arrest postintubation.   ?4/5, in an effort to correct vasoplegia, patient was also started on [name] ?Because of septic shock, patient's renal function worsened. ?4/6, patient was started on CRRT ?4/11, patient was extubated. ?4/14, patient was transferred out to Jeffrey Ayala service. ?4/23, underwent left transmetatarsal amputation and right BKA by Dr. Lajoyce Ayala ?4/24, scrotum debridement and subsequent ICU readmission ?4/27, transfer to Jeffrey Ayala service ?4/29, scrotal wound culture with E. Faecalis and MDR Pseudomonas aeruginosa; ID consulted and patient transitioned to Ampicillin, Avycaz and Flagyl ?5/1, increased lucidity. ID transitioned antibiotics to Augmentin and Zerbaxa ?   ? ? ?Assessment & Plan: ?  ?Principal Problem: ?  Septic shock (Jeffrey Ayala) ?Active Problems: ?  Cardiac arrest Jeffrey Ayala) ?  Scrotal infection ?  Gangrene of right foot (Jeffrey Ayala) ?  Gangrene of left foot (Jeffrey Ayala) ?  AKI (acute kidney injury) (Jeffrey Ayala) ?  Atrial fibrillation with RVR (Jeffrey Ayala) ?  Altered mental status ?  Anemia ?  Diarrhea ?  Shock liver ?  Acute respiratory failure with hypoxia (Jeffrey Ayala) ?  Gout ?  Acute pain ?   Demand ischemia (Jeffrey Ayala) ?  Thrombocytopenia (Jeffrey Ayala) ?  Malnutrition of moderate degree ?  Pressure injury of skin ?  Encounter for central line placement ?  Severe protein-calorie malnutrition (Jeffrey Ayala) ?  Hypotension ?  Respiratory insufficiency ?  Cellulitis ?  Obesity, Class III, BMI 40-49.9 (morbid obesity) (Jeffrey Ayala) ? ? ?Assessment and Plan: ?* Septic shock (Jeffrey Ayala) ?Due to scrotal infection ?Required ICU admission with vasopressor support ? ?Cardiac arrest Jeffrey Ayala) ?"brief" cardiac arrest post intubation on 4/3 ?Echo with normal EF, no RWMA ?No additional plans for ischemic testing at this time per cards ? ? ?Scrotal infection ?Dry gangrene of anterior scrotum, s/p debridement on 4/24 ?MDR pseudomonas, enterococcus faecalis, bacteroides ovalis, beta lactamase positive ?Continue zyrbaxa and augmentin per ID ?Planning for repeat debridement in OR on Saturday per Jeffrey Ayala ? ? ? ? ?Gangrene of left foot (Jeffrey Ayala) ?S/p L TMA and right BKA with Dr. Lajoyce Ayala on 4/23 ?Per ID, no abx indicated at this time ? ?Gangrene of right foot (Jeffrey Ayala) ?S/p L TMA and right BKA with Dr. Lajoyce Ayala on 4/23 ?Per ID, no abx indicated at this time ? ?AKI (acute kidney injury) (Jeffrey Ayala) ?Thought 2/2 ATN from septic shock ?Anuric and dialysis dependent since 02/26/2022 ?Per renal ? ?Atrial fibrillation with RVR (Jeffrey Ayala) ?New diagnosis. CHA2DS2-VASc Score is 1 (PAD).Transthoracic Echocardiogram with preserved LVEF. Mostly rate controlled but did have some associated tachycardia while agitated. Initially was managed on heparin IV which was discontinued secondary to anemia. Cardiology consulted. Patient started on metoprolol 37.5 mg TID and aspirin 81 mg.  ?-  Continue metoprolol 37.5 mg TID and aspirin 81 mg daily ?- needs outpatient cardiology follow up ? ?Altered mental status ?Unsure of specific etiology. Possibly secondary to critical illness but also complicated by uremia and concern for possible hypoxemic injury while in septic shock. CT head unremarkable for acute process.  Patient started on Seroquel for hopeful management with not much improvement. Patient with waxing and waning symptoms. Normal vitamin B1 and B12. Patient found to have likely smoldering infection that might have contributed to persistent mental status issues. ?-Delirium precautions ?-Continue Seroquel BID; will discontinue AM seroquel for now - follow and will deescalate/d/c as able ?-Haldol PRN ? ?Anemia ?Stable around 7.2 ?Will follow  ?Follow anemia labs ?S/p 4 units pRBC ? ?Diarrhea ?Negative c diff on 4/2 ?Negative gi path panel on 4/17 ?Has rectal tube in place ? ?Shock liver ?improved ? ?Acute respiratory failure with hypoxia (Jeffrey Ayala) ?Patient intubated from 4/3 - 4/10, 4/23 - 4/23, 4/24 - 4/25. Resolved. ? ?Gout ?Now on allopurinol ?Colchicine has now been d/c'd ? ?Demand ischemia (Jeffrey Ayala) ?In setting of septic shock and cardiac arrest ?Per cards, due to demand ischemia ? ?Thrombocytopenia (Jeffrey Ayala) ?S/p 1 unit pRBC ? ?Malnutrition of moderate degree ?In setting of altered mentation. Dietitian consulted. Small bore NG tube placed on 4/17 and tube feeds initiated. Unfortunately, NG tube mistakenly removed on 4/28. SLP reevaluated on 4/29 with recommendation for dysphagia 3 diet. Currently undergoing calorie count.  ?-SLP recommendations (4/29): ?  Diet recommendations: Dysphagia 3 (mechanical soft);Thin liquid ?Liquids provided via: Cup;Straw ?Medication Administration: Crushed with puree ?Supervision: Staff to assist with self feeding;Full supervision/cueing for compensatory strategies;Trained caregiver to feed patient ?Compensations: Minimize environmental distractions;Slow rate;Small sips/bites ?Postural Changes and/or Swallow Maneuvers: Seated upright 90 degrees  ? ? ? ?Pressure injury of skin ?Pressure Injury 02/22/22 Coccyx Medial Stage 2 -  Partial thickness loss of dermis presenting as Jerin Franzel shallow open injury with Ramond Darnell red, pink wound bed without slough. pink, painful (Active)  ?02/22/22 2330  ?Location: Coccyx   ?Location Orientation: Medial  ?Staging: Stage 2 -  Partial thickness loss of dermis presenting as Sharis Keeran shallow open injury with Jhalil Silvera red, pink wound bed without slough.  ?Wound Description (Comments): pink, painful  ?Present on Admission: Yes  ? ? ? ? ? ?DVT prophylaxis: heparin ?Code Status: full ?Family Communication: none ?Disposition:  ? ?Status is: Inpatient ?Remains inpatient appropriate because: need for continued abx, urology intervention ?  ?Consultants:  ?ID ?Urology ?PCCM ?Cardiology ?Nephrology ?Vascular surgery ? ?Procedures:  ?LEFT TRANSMETATARSAL AMPUTATION, RIGHT BELOW KNEE AMPUTATION (4/23) ?Debridement of scrotum (4/24) ?Echo ?IMPRESSIONS  ? ? ? 1. Left ventricular ejection fraction, by estimation, is 60 to 65%. The  ?left ventricle has normal function. The left ventricle has no regional  ?wall motion abnormalities. There is mild left ventricular hypertrophy.  ?Left ventricular diastolic function  ?could not be evaluated.  ? 2. Right ventricular systolic function is normal. The right ventricular  ?size is normal.  ? 3. The mitral valve is normal in structure. No evidence of mitral valve  ?regurgitation. No evidence of mitral stenosis.  ? 4. The aortic valve is tricuspid. Aortic valve regurgitation is not  ?visualized. No aortic stenosis is present.  ? 5. The inferior vena cava is dilated in size with <50% respiratory  ?variability, suggesting right atrial pressure of 15 mmHg.  ? ?Comparison(s): No prior Echocardiogram.  ? ?ABI  ?Summary:  ?Right: Resting right ankle-brachial index indicates severe right lower  ?extremity arterial disease.  ? ?Unable to  obtain TBI due to low amplitude waveforms.  ?Left: Resting left ankle-brachial index indicates noncompressible left  ?lower extremity arteries.  ? ?ABI ?Summary:  ?Right: Resting right ankle-brachial index indicates noncompressible right  ?lower extremity arteries.  ? ?Left: Resting left ankle-brachial index indicates noncompressible left  ?lower  extremity arteries. The left toe-brachial index is abnormal.  ? ?Antimicrobials:  ?Anti-infectives (From admission, onward)  ? ? Start     Dose/Rate Route Frequency Ordered Stop  ? 03/23/22 1900  ceftolozane-tazobacta

## 2022-03-25 NOTE — Progress Notes (Signed)
Inpatient Rehabilitation Admissions Coordinator  ? ?Noted plans for OR on Saturday. I will follow up next week with his progress to assist with planning dispo when appropriate.  ? ?Danne Baxter, RN, MSN ?Rehab Admissions Coordinator ?[phone] ?03/25/2022 1:50 PM ? ?

## 2022-03-25 NOTE — Hospital Course (Addendum)
Jeffrey Ayala is Jeffrey Ayala 61 y.o. male with PMH significant for arthritis, bilateral knee replacement, gastric ulcer, kidney stones, eosinophilic esophagitis, migraines not on any regular meds.   ?Patient presented to the ED on 4/2 with complaint of nausea, vomiting, diarrhea and confusion for 24 hours.   ?EMS noted him to be in Jeffrey Ayala-fib with Jeffrey Ayala heart rate of 120-160 bpm. ?In the ED, he had Jeffrey Ayala fever of 102.9. ?CT abdomen pelvis showed swollen scrotum concerning for infection, colon filled with diarrhea.  ?He was hypotensive requiring multiple vasopressors in the ICU.   ?4/3, patient required intubation and also had Jeffrey Ayala brief cardiac arrest postintubation.   ?4/5, in an effort to correct vasoplegia, patient was also started on [name] ?Because of septic shock, patient's renal function worsened. ?4/6, patient was started on CRRT ?4/11, patient was extubated. ?4/14, patient was transferred out to [facility] service. ?4/23, underwent left transmetatarsal amputation and right BKA by [name] ?4/24, scrotum debridement and subsequent ICU readmission ?4/27, transfer to [facility] service ?4/29, scrotal wound culture with E. Faecalis and MDR Pseudomonas aeruginosa; ID consulted and patient transitioned to Ampicillin, Avycaz and Flagyl ?5/1, increased lucidity. ID transitioned antibiotics to Augmentin and Zerbaxa ?5/6 s/p I&D with urology ?5/7  transfused 2 units pRBC ?5/8  transfused 3 units pRBC, CT abd/pelvis.  Plan for abx for 7 days after last surgery. ?5/9  working on placement.  Hb appears stable, will need continued follow up. ?

## 2022-03-25 NOTE — Assessment & Plan Note (Signed)
In setting of septic shock and cardiac arrest ?Per cards, due to demand ischemia ?

## 2022-03-25 NOTE — Progress Notes (Addendum)
Nutrition Follow-up ? ?DOCUMENTATION CODES:  ? ?Non-severe (moderate) malnutrition in context of acute illness/injury ? ?INTERVENTION:  ? ?Continue Calorie count x 24 more hours. ? ?Continue Ensure Plus High Protein po BID, each supplement provides 350 kcal and 20 grams of protein. ? ?Continue Juven 1 packet PO BID, each packet provides 80 calories, 8 grams of carbohydrate, 2.5  grams of protein (collagen), 7 grams of L-arginine and 7 grams of L-glutamine; supplement contains CaHMB, Vitamins C, E, B12 and Zinc to promote wound healing. ? ?Continue Prosource Plus 30 ml PO TID, each packet provides 100 kcal and 15 gm protein. ? ?Continue vitamin C and zinc. ? ?Continue Renal MVI daily. ? ?NUTRITION DIAGNOSIS:  ? ?Moderate Malnutrition related to acute illness (septic shock, A fib, AKI) as evidenced by mild fat depletion, mild muscle depletion, moderate muscle depletion. ? ?Ongoing  ? ?GOAL:  ? ?Patient will meet greater than or equal to 90% of their needs ? ?Progressing ? ?MONITOR:  ? ?PO intake, Supplement acceptance, Labs, Skin ? ?REASON FOR ASSESSMENT:  ? ?Consult ?Diet education, Calorie Count (renal failure/carb modified) ? ?ASSESSMENT:  ? ?61 year old male who presented to the ED on 4/02 with N/V/D. PMH of HTN, PUD, eosinophilic esophagitis, kidney stones. Pt admitted with septic shock, new onset atrial fibrillation, AKI. ? ?04/03 - intubated, brief cardiac arrest ?04/06 - CRRT initiated ?04/10 - extubated, diet advanced to full liquids ?04/11 - diet advanced to regular ?04/17 - Cortrak placed, tube feeds started, s/p tunneled HD catheter ?04/23 - NPO at midnight, s/p L transmetatarsal amputation and R BKA ?04/24 - s/p debridement of dry gangrene of the anterior scrotum, remained intubated post-op ?04/25 - extubated, TF resumed  ?04/28- NG tube removed ?04/29- diet advanced to dysphagia 3 ? ?Plans for repeat debridement of scrotum on Saturday.  ? ?48 hour calorie count initiated yesterday.  ?Diet: dysphagia 3  with thin liquids.  ?Supplements: Ensure Plus BID, Prosource Plus TID, Juven BID. ?Also receiving vitamin C, zinc to support wound healing. ?Patient is currently in HD and did not eat breakfast this morning. ? ?Meals (only dinner ticket available): 510 kcal, 25 gm protein. ?Supplements: 710 kcal, 55 gm protein. ? ?Total: 1220 kcal, 80 gm protein ? ?Intake of one meal and supplements yesterday met 53% of minimum estimated protein and calorie requirements.  ? ?Intake has greatly improved. Suspect patient will be able to meet nutrition needs PO. Will continue calorie count x 24 more hours to ensure adequate calorie and protein intake.  ? ?Labs reviewed. Phos 7.4 ?CBG: 117-105 ? ?Medications reviewed and include vitamin C, Aranesp, Nutrisource Fiber, folic acid, Novolog, Rena-vit, Renvela, zinc sulfate. ? ?Diet Order:   ?Diet Order   ? ?       ?  DIET DYS 3 Room service appropriate? Yes; Fluid consistency: Thin  Diet effective now       ?  ? ?  ?  ? ?  ? ? ?EDUCATION NEEDS:  ? ?No education needs have been identified at this time ? ?Skin:  Skin Assessment: Skin Integrity Issues: ?Skin Integrity Issues:: Stage II, Other (Comment), Wound VAC, Incisions ?Stage II: coccyx ?Wound Vac: R leg, L leg ?Incisions: scrotum ?Other: skin tear L thigh and R elbow ? ?Last BM:  5/2 type 7 ? ?Height:  ? ?Ht Readings from Last 1 Encounters:  ?03/15/22 '5\' 10"'  (1.778 m)  ? ? ?Weight:  ? ?Wt Readings from Last 1 Encounters:  ?03/25/22 133.4 kg  ? ? ?Ideal Body Weight:  70.6 kg ? ?BMI:  Body mass index is 42.18 kg/m?. ? ?Estimated Nutritional Needs:  ? ?Kcal:  2300-2500 ? ?Protein:  150-170 grams ? ?Fluid:  >/= 2.0 L ? ? ? ?Lucas Mallow RD, LDN, CNSC ?Please refer to Amion for contact information.                                                       ? ?

## 2022-03-25 NOTE — Assessment & Plan Note (Addendum)
In setting of altered mentation. Dietitian consulted. Small bore NG tube placed on 4/17 and tube feeds initiated. Unfortunately, NG tube mistakenly removed on 4/28. SLP reevaluated on 4/29 with recommendation for dysphagia 3 diet. Currently undergoing calorie count - met caloric needs.  ?-SLP recommendations (4/29): ?? 1. Diet recommendations: Dysphagia 3 (mechanical soft);Thin liquid ?2. Liquids provided via: Cup;Straw ?3. Medication Administration: Crushed with puree ?4. Supervision: Staff to assist with self feeding;Full supervision/cueing for compensatory strategies;Trained caregiver to feed patient ?5. Compensations: Minimize environmental distractions;Slow rate;Small sips/bites ?6. Postural Changes and/or Swallow Maneuvers: Seated upright 90 degrees  ? ? ?

## 2022-03-25 NOTE — Progress Notes (Signed)
Patient ID: Jeffrey Ayala, male   DOB: 09/19/1961, 61 y.o.   MRN: [id number] ? ?9 Days Post-Op ?Subjective: ?Pt stable.  Less confused. ? ?Objective: ?Vital signs in last 24 hours: ?Temp:  [98 ?F (36.7 ?C)-98.3 ?F (36.8 ?C)] 98.3 ?F (36.8 ?C) (05/03 0430) ?Pulse Rate:  [82-100] 100 (05/03 0430) ?Resp:  [17-20] 18 (05/03 0430) ?BP: (133-147)/(68-87) 147/80 (05/03 0430) ?SpO2:  [97 %-100 %] 100 % (05/03 0430) ?Weight:  [133.4 kg] 133.4 kg (05/03 0430) ? ?Intake/Output from previous day: ?05/02 0701 - 05/03 0700 ?In: 202 [IV ANVBTYOMA:004] ?Out: -  ?Intake/Output this shift: ?Total I/O ?In: 202 [IV HTXHFSFSE:395] ?Out: -  ? ?Physical Exam:  ?GU: Still with stable fibrinous exudate over testes especially in right upper scrotal wound with minimal granulation tissue ? ?Lab Results: ?No results for input(s): HGB, HCT in the last 72 hours. ?BMET ?Recent Labs  ?  03/24/22 ?0159 03/25/22 ?0158  ?NA 137 138  ?K 3.5 3.9  ?CL 99 102  ?CO2 24 22  ?GLUCOSE 104* 107*  ?BUN 47* 66*  ?CREATININE 5.24* 7.08*  ?CALCIUM 8.3* 8.4*  ? ? ?  Latest Ref Rng & Units 03/22/2022  ?  3:28 AM 03/21/2022  ?  1:19 AM 03/20/2022  ?  3:22 AM  ?CBC  ?WBC 4.0 - 10.5 K/uL 5.8   7.6   8.4    ?Hemoglobin 13.0 - 17.0 g/dL 7.4   7.1   7.7    ?Hematocrit 39.0 - 52.0 % 23.1   22.4   24.7    ?Platelets 150 - 400 K/uL 217   223   225    ? ? ? ?Studies/Results: ?No results found. ? ?Assessment/Plan: ?1) Scrotal infection/wound: S/P initial debridement 4/24.  Tissue cultures noted with Enterococcus and Pseudomonas.  Antibiotic therapy adjusted per ID.  I spoke with Dr. Lonny Prude yesterday.  Although we have attempted to avoid repeat operative intervention due to prolonged intubation after last anesthetic, he is less confused and combative now and will likely due better.  Furthermore, his wound appears to be developing minimal granulation tissue and clearly with benefit from further debridement.  Will coordinate with nephrology.  Assuming patient is to receive dialysis  on Friday, would consider repeat debridement in the OR on Saturday.  Will discuss with [name] and will update Mrs. Freimuth on plan. ? ? LOS: 31 days  ? ?[name] ?03/25/2022, 6:49 AM ? ?  ?

## 2022-03-25 NOTE — Assessment & Plan Note (Signed)
Now on allopurinol ?Colchicine has now been d/c'd ?

## 2022-03-25 NOTE — Assessment & Plan Note (Addendum)
Negative c diff on 4/2 ?Negative gi path panel on 4/17 ?Has rectal tube in place ?Continue imodium, will discuss with RN ?

## 2022-03-25 NOTE — Assessment & Plan Note (Signed)
Unsure of specific etiology. Possibly secondary to critical illness but also complicated by uremia and concern for possible hypoxemic injury while in septic shock. CT head unremarkable for acute process. Patient started on Seroquel for hopeful management with not much improvement. Patient with waxing and waning symptoms. Normal vitamin B1 and B12. Patient found to have likely smoldering infection that might have contributed to persistent mental status issues. ?-Delirium precautions ?-Continue Seroquel BID; will discontinue AM seroquel for now - follow and will deescalate/d/c as able ?-Haldol PRN ?

## 2022-03-25 NOTE — Assessment & Plan Note (Signed)
New diagnosis. CHA2DS2-VASc Score?is 1 (PAD).Transthoracic Echocardiogram with preserved LVEF. Mostly rate controlled but did have some associated tachycardia while agitated. Initially was managed on heparin IV which was discontinued secondary to anemia. Cardiology consulted. Patient started on metoprolol 37.5 mg TID and aspirin 81 mg.  ?-Continue metoprolol 37.5 mg TID and aspirin 81 mg daily ?- needs outpatient cardiology follow up ?

## 2022-03-25 NOTE — Progress Notes (Signed)
?  [facility] ? ? ?Reason for visit: Follow up on scrotal infection ? ?Interval History: no new positive cultures ?Day 3 Zerbaxa and Augmentin ? Day 32 total antibiotics ? ?Physical Exam: ?Constitutional:  ?Vitals:  ? 03/25/22 1000 03/25/22 1030  ?BP: 136/85 140/81  ?Pulse:    ?Resp: 18 18  ?Temp:    ?SpO2:    ? patient appears in NAD; currently on dialysis ?Respiratory: Normal respiratory effort; CTA B ?Cardiovascular: RRR ? ? ?Review of Systems: ?Constitutional: negative for fevers and chills ?Gastrointestinal: negative for nausea and diarrhea ? ?Lab Results  ?Component Value Date  ? WBC 5.9 03/25/2022  ? HGB 7.2 (L) 03/25/2022  ? HCT 22.4 (L) 03/25/2022  ? MCV 92.6 03/25/2022  ? PLT 215 03/25/2022  ?  ?Lab Results  ?Component Value Date  ? CREATININE 7.08 (H) 03/25/2022  ? BUN 66 (H) 03/25/2022  ? NA 138 03/25/2022  ? K 3.9 03/25/2022  ? CL 102 03/25/2022  ? CO2 22 03/25/2022  ?  ?Lab Results  ?Component Value Date  ? ALT 29 03/14/2022  ? AST 41 03/14/2022  ? ALKPHOS 165 (H) 03/14/2022  ?  ? ?Microbiology: ?Recent Results (from the past 240 hour(s))  ?Aerobic/Anaerobic Culture w Gram Stain (surgical/deep wound)     Status: None  ? Collection Time: 03/16/22  2:00 PM  ? Specimen: Skin, Cyst/Tag/Debridement  ?Result Value Ref Range Status  ? Specimen Description SKIN  Final  ? Special Requests NONE  Final  ? Gram Stain   Final  ?  RARE WBC PRESENT,BOTH PMN AND MONONUCLEAR ?NO ORGANISMS SEEN ?  ? Culture   Final  ?  No growth aerobically or anaerobically. ?Performed at [facility], [place] [address], [place], [place] [address] ?  ? Report Status 03/21/2022 FINAL  Final  ?Aerobic/Anaerobic Culture w Gram Stain (surgical/deep wound)     Status: None (Preliminary result)  ? Collection Time: 03/16/22  3:45 PM  ? Specimen: PATH GU biopsy; Tissue  ?Result Value Ref Range Status  ? Specimen Description SCROTUM  Final  ? Special Requests TISSUE  Final  ? Gram Stain   Final  ?  NO WBC  SEEN ?ABUNDANT GRAM POSITIVE COCCI ?FEW GRAM NEGATIVE RODS ?  ? Culture   Final  ?  ABUNDANT ENTEROCOCCUS FAECALIS ?FEW PSEUDOMONAS AERUGINOSA ?MULTI-DRUG RESISTANT ORGANISM ?ABUNDANT BACTEROIDES OVATUS ?BETA LACTAMASE POSITIVE ?CRITICAL RESULT CALLED TO, READ BACK BY AND VERIFIED WITH: PHARMD K PIERCE [id number] AT 1019 BY CM ?Sent to [place] for further susceptibility testing. ?Performed at [facility], [place] [address] [place], [place] [address] ?  ? Report Status PENDING  Incomplete  ? Organism ID, Bacteria ENTEROCOCCUS FAECALIS  Final  ? Organism ID, Bacteria PSEUDOMONAS AERUGINOSA  Final  ?    Susceptibility  ? Enterococcus faecalis - MIC*  ?  AMPICILLIN <=2 SENSITIVE Sensitive   ?  VANCOMYCIN 1 SENSITIVE Sensitive   ?  GENTAMICIN SYNERGY RESISTANT Resistant   ?  * ABUNDANT ENTEROCOCCUS FAECALIS  ? Pseudomonas aeruginosa - MIC*  ?  CEFTAZIDIME 16 INTERMEDIATE Intermediate   ?  CIPROFLOXACIN >=4 RESISTANT Resistant   ?  GENTAMICIN 8 INTERMEDIATE Intermediate   ?  IMIPENEM >=16 RESISTANT Resistant   ?  CEFEPIME RESISTANT Resistant   ?  * FEW PSEUDOMONAS AERUGINOSA  ?Min Inhibitory Conc (2 Drugs)     Status: None  ? Collection Time: 03/16/22  3:45 PM  ?Result Value Ref Range Status  ?  Min Inhibitory Conc (2 Drugs) Preliminary report  Final  ?  Comment: (NOTE) ?Performed At: [facility] ?[address] [place], [place] [id number] ?Rush Farmer MD [id number] ?  ? Source (MIC2) PSEUDOMONAS AERUGINOSA/ AVYCAZ ZERBAXA/ SCROTUM  Final  ?  Comment: Performed at [facility], [place] [address], [place], [place] [address]  ?MIC Results (2 Drugs)     Status: None  ? Collection Time: 03/16/22  3:45 PM  ?Result Value Ref Range Status  ? RESULT 5 MIC (RESULT 1)                Base Name: Comment  Final  ?  Comment: (NOTE) ?Pseudomonas aeruginosa ?Identification performed by account, not confirmed by this ?laboratory. ?Performed At: [facility] ?[address] [place], [place] [id number] ?Rush Farmer MD [id number] ?  ? ? ?Impression/Plan:  ?1. Scrotal infection - has a MDR Pseudomonas, also with Enterococcus and Bacteroides in culture and on appropriate antibiotics with Zyrbaxa and Augmentin.  ?Will continue with these and see how it looks after surgical debridement on Saturday.  If he needs continued antibiotics at discharge, he will need a tunneled catheter placed.   ? ?2.  Dry gangrene bilateral feet - s/p amputation with a wound VAC.  No antibiotics indicated.   ? ?3.  Acute kidney injury - now dialysis dependent.  Will avoid picc line if he needs access at discharge for above.   ? ?  ?

## 2022-03-25 NOTE — Assessment & Plan Note (Signed)
Due to scrotal infection ?Required ICU admission with vasopressor support ?

## 2022-03-25 NOTE — Assessment & Plan Note (Signed)
S/p L TMA and right BKA with [name] on 4/23 ?Per ID, no abx indicated at this time ?

## 2022-03-25 NOTE — Progress Notes (Addendum)
Mobility Specialist Progress Note ? ? 03/25/22 1619  ?Mobility  ?Activity Transferred to/from [facility]  ?Level of Assistance +2 (takes two people)  ?Assistive Device Front wheel walker  ?Distance Ambulated (ft)  --   ?Activity Response Tolerated well  ?$Mobility charge 1 Mobility  ? ?Received pt in uncomfortable position requesting to get shifted, repositioned and cleaned from soiling themselves. +2A required for rolling and mod cues required for cognitive delay. Left in supine, comfortable and w/ all needs met. ? ?Holland Falling ?Mobility Specialist ?Phone Number [phone] ? ?

## 2022-03-25 NOTE — Assessment & Plan Note (Addendum)
Dry gangrene of anterior scrotum, s/p debridement on 4/24 ?S/p irrigation and debridement of scrotum with wound closure on 5/6 ?MDR pseudomonas, enterococcus faecalis, bacteroides ovalis, beta lactamase positive ?Continue zerbaxa and augmentin per ID -> recommending 7 days after last surgery.  Plan for f/u in clinic on 5/19 at 3;30 PM (if still in the hospital, will need to [place]) ?Pain management ? ? ? ?

## 2022-03-25 NOTE — Assessment & Plan Note (Signed)
Patient intubated from 4/3 - 4/10, 4/23 - 4/23, 4/24 - 4/25. Resolved. ?

## 2022-03-25 NOTE — Procedures (Signed)
Patient seen on Hemodialysis, he is comfortable and with stable vital signs. ?BP (!) 149/75   Pulse 100   Temp 97.9 ?F (36.6 ?C) (Oral)   Resp 18   Ht '5\' 10"'$  (1.778 m)   Wt 133.4 kg   SpO2 100%   BMI 42.18 kg/m?   ?QB 400, UF goal 2.5 L ?Tolerating treatment without complaints at this time. ? ?Discussed earlier with Dr. Alinda Money via secure chat, okay to undertake scrotal wound debridement on Saturday after dialysis Friday. ? ?Elmarie Shiley MD ?[facility]. ?Office # [phone] ?Pager # [phone] ?9:03 AM ? ?

## 2022-03-25 NOTE — Assessment & Plan Note (Addendum)
Pressure Injury 02/22/22 Coccyx Medial Stage 2 -  Partial thickness loss of dermis presenting as Vasily Fedewa shallow open injury with Ilhan Debenedetto red, pink wound bed without slough. pink, painful (Active)  ?02/22/22 2330  ?Location: Coccyx  ?Location Orientation: Medial  ?Staging: Stage 2 -  Partial thickness loss of dermis presenting as Dystany Duffy shallow open injury with Ahmiyah Coil red, pink wound bed without slough.  ?Wound Description (Comments): pink, painful  ?Present on Admission: Yes  ? ? ? ?

## 2022-03-25 NOTE — Assessment & Plan Note (Addendum)
Hb seems stable today, trend ?Suspect most likely post op given fall after surgery ?Follow CT scan -> no acute intra abdominal or pelvic pathology, non obstructing bilateral renal calculi, colonic diverticulosis, no bowel obstruction, normal appendix, cirrhosis with splenomegaly and small perihepatic ascites ?Smear review -> no schistocytes, no blast, platelets ?Negative hemoccult ?Follow anemia labs - c/w aocd ?S/p 9 total units pRBC ?

## 2022-03-25 NOTE — Assessment & Plan Note (Signed)
"  brief" cardiac arrest post intubation on 4/3 ?Echo with normal EF, no RWMA ?No additional plans for ischemic testing at this time per cards ? ?

## 2022-03-25 NOTE — Assessment & Plan Note (Addendum)
S/p 1 unit platelets ?

## 2022-03-25 NOTE — Assessment & Plan Note (Signed)
Thought 2/2 ATN from septic shock ?Anuric and dialysis dependent since 02/26/2022 ?Per renal ?

## 2022-03-26 DIAGNOSIS — R6521 Severe sepsis with septic shock: Secondary | ICD-10-CM | POA: Diagnosis not present

## 2022-03-26 DIAGNOSIS — A419 Sepsis, unspecified organism: Secondary | ICD-10-CM | POA: Diagnosis not present

## 2022-03-26 LAB — CBC WITH DIFFERENTIAL/PLATELET
Abs Immature Granulocytes: 0.02 10*3/uL (ref 0.00–0.07)
Basophils Absolute: 0 10*3/uL (ref 0.0–0.1)
Basophils Relative: 0 %
Eosinophils Absolute: 0 10*3/uL (ref 0.0–0.5)
Eosinophils Relative: 0 %
HCT: 23.4 % — ABNORMAL LOW (ref 39.0–52.0)
Hemoglobin: 7.3 g/dL — ABNORMAL LOW (ref 13.0–17.0)
Immature Granulocytes: 0 %
Lymphocytes Relative: 22 %
Lymphs Abs: 1.1 10*3/uL (ref 0.7–4.0)
MCH: 29 pg (ref 26.0–34.0)
MCHC: 31.2 g/dL (ref 30.0–36.0)
MCV: 92.9 fL (ref 80.0–100.0)
Monocytes Absolute: 0.7 10*3/uL (ref 0.1–1.0)
Monocytes Relative: 15 %
Neutro Abs: 3.1 10*3/uL (ref 1.7–7.7)
Neutrophils Relative %: 63 %
Platelets: 239 10*3/uL (ref 150–400)
RBC: 2.52 MIL/uL — ABNORMAL LOW (ref 4.22–5.81)
RDW: 15.4 % (ref 11.5–15.5)
WBC: 5 10*3/uL (ref 4.0–10.5)
nRBC: 0 % (ref 0.0–0.2)

## 2022-03-26 LAB — GLUCOSE, CAPILLARY
Glucose-Capillary: 101 mg/dL — ABNORMAL HIGH (ref 70–99)
Glucose-Capillary: 109 mg/dL — ABNORMAL HIGH (ref 70–99)
Glucose-Capillary: 119 mg/dL — ABNORMAL HIGH (ref 70–99)
Glucose-Capillary: 126 mg/dL — ABNORMAL HIGH (ref 70–99)
Glucose-Capillary: 142 mg/dL — ABNORMAL HIGH (ref 70–99)
Glucose-Capillary: 99 mg/dL (ref 70–99)

## 2022-03-26 LAB — COMPREHENSIVE METABOLIC PANEL
ALT: 12 U/L (ref 0–44)
AST: 27 U/L (ref 15–41)
Albumin: 2.2 g/dL — ABNORMAL LOW (ref 3.5–5.0)
Alkaline Phosphatase: 105 U/L (ref 38–126)
Anion gap: 9 (ref 5–15)
BUN: 34 mg/dL — ABNORMAL HIGH (ref 8–23)
CO2: 27 mmol/L (ref 22–32)
Calcium: 8.3 mg/dL — ABNORMAL LOW (ref 8.9–10.3)
Chloride: 103 mmol/L (ref 98–111)
Creatinine, Ser: 4.81 mg/dL — ABNORMAL HIGH (ref 0.61–1.24)
GFR, Estimated: 13 mL/min — ABNORMAL LOW (ref >60)
Glucose, Bld: 98 mg/dL (ref 70–99)
Potassium: 3.6 mmol/L (ref 3.5–5.1)
Sodium: 139 mmol/L (ref 135–145)
Total Bilirubin: 1.3 mg/dL — ABNORMAL HIGH (ref 0.3–1.2)
Total Protein: 6.6 g/dL (ref 6.5–8.1)

## 2022-03-26 LAB — MAGNESIUM: Magnesium: 2.2 mg/dL (ref 1.7–2.4)

## 2022-03-26 LAB — HEPATITIS B SURFACE ANTIBODY, QUANTITATIVE: Hep B S AB Quant (Post): <3.1 m[IU]/mL — ABNORMAL LOW (ref >9.9)

## 2022-03-26 LAB — IRON AND TIBC
Iron: 168 ug/dL (ref 45–182)
Saturation Ratios: 88 % — ABNORMAL HIGH (ref 17.9–39.5)
TIBC: 190 ug/dL — ABNORMAL LOW (ref 250–450)
UIBC: 22 ug/dL

## 2022-03-26 LAB — FERRITIN: Ferritin: 709 ng/mL — ABNORMAL HIGH (ref 24–336)

## 2022-03-26 LAB — FOLATE: Folate: 33.5 ng/mL (ref >5.9)

## 2022-03-26 LAB — VITAMIN B12: Vitamin B-12: 726 pg/mL (ref 180–914)

## 2022-03-26 LAB — PHOSPHORUS: Phosphorus: 4.9 mg/dL — ABNORMAL HIGH (ref 2.5–4.6)

## 2022-03-26 MED ORDER — NEPRO/CARBSTEADY PO LIQD
237.0000 mL | Freq: Two times a day (BID) | ORAL | Status: DC
  Administered 2022-03-26 – 2022-04-01 (×8): 237 mL via ORAL

## 2022-03-26 NOTE — Progress Notes (Signed)
Patient ID: Jeffrey Ayala, male   DOB: [birth year], 61 y.o.   MRN: [id number] ?[facility] ?Progress Note  ? ?Assessment/ Plan:   ?1. Acute kidney Injury: Etiology suspected to be ATN from severe septic shock in the setting of foot wounds.  Oliguric overnight with 400 cc urine output and has been dialysis dependent since 02/26/2022 (initially started with CRRT and transition to IHD).  I have placed orders for hemodialysis again tomorrow (MWF schedule while here) as he does not have any clear indication of renal recovery. ?2.  Scrotal edema/wound: Status post surgical debridement by urology on 4/24; wound still has extensive fibrinous material over testes especially over right upper wound with plans by urology for debridement on 5/6 (Saturday) under general anesthesia.  He is on Augmentin and Zerbaxa for antimicrobial coverage. ?3.  Leg wound status post left TMA and right below-knee amputation on 03/15/2022: Septic shock resolved and remains on antimicrobial coverage. ?4.  Anemia: With relatively stable hemoglobin and hematocrit, continue dosing ESA. ?5.  Hyperphosphatemia: Secondary to acute kidney injury and corrected to normal range with ongoing binders/hemodialysis ? ?Subjective:   ?Without acute events overnight, tolerated hemodialysis without problems yesterday.  ? ?Objective:   ?BP (!) 142/92 (BP Location: Right Arm)   Pulse 73   Temp 98.3 ?F (36.8 ?C) (Oral)   Resp 20   Ht '5\' 10"'$  (1.778 m)   Wt 133.4 kg   SpO2 99%   BMI 42.18 kg/m?  ? ?Intake/Output Summary (Last 24 hours) at 03/26/2022 0907 ?Last data filed at 03/26/2022 0513 ?Gross per 24 hour  ?Intake 1244.02 ml  ?Output 3000 ml  ?Net -1755.98 ml  ? ?Weight change:  ? ?Physical Exam: ?Gen: Comfortably resting in bed, son at bedside ?CVS: Pulse regular rhythm, normal rate, S1 and S2 normal ?Resp: Clear to auscultation bilaterally, no distinct rales or rhonchi ?Abd: Soft, obese, nontender, bowel sounds normal ?Ext: Status post left TMA and  right BKA with wound vacs in place ? ?Imaging: ?No results found. ? ?Labs: ?BMET ?Recent Labs  ?Lab 03/20/22 ?3220 03/21/22 ?0119 03/22/22 ?2542 03/23/22 ?7062 03/24/22 ?0159 03/25/22 ?0158 03/26/22 ?0151  ?NA 129* 133* 134* 133* 137 138 139  ?K 4.1 3.4* 3.7 3.8 3.5 3.9 3.6  ?CL 95* 98 96* 96* 99 102 103  ?CO2 20* 24 22 21* '24 22 27  '$ ?GLUCOSE 132* 108* 100* 109* 104* 107* 98  ?BUN 109* 56* 80* 96* 47* 66* 34*  ?CREATININE 7.16* 4.63* 6.43* 8.13* 5.24* 7.08* 4.81*  ?CALCIUM 8.4* 8.3* 8.6* 8.3* 8.3* 8.4* 8.3*  ?PHOS 6.5* 4.9* 6.6* 7.8* 5.4* 7.4* 4.9*  ? ?CBC ?Recent Labs  ?Lab 03/21/22 ?0119 03/22/22 ?3762 03/25/22 ?0745 03/26/22 ?0151  ?WBC 7.6 5.8 5.9 5.0  ?NEUTROABS  --   --   --  3.1  ?HGB 7.1* 7.4* 7.2* 7.3*  ?HCT 22.4* 23.1* 22.4* 23.4*  ?MCV 91.4 92.0 92.6 92.9  ?PLT 223 217 215 239  ? ? ?Medications:   ? ? (feeding supplement) PROSource Plus  30 mL Oral TID BM  ? sodium chloride   Intravenous Once  ? acetaminophen  650 mg Oral Q6H  ? allopurinol  100 mg Oral Daily  ? amoxicillin-clavulanate  1 tablet Oral BID  ? vitamin C  1,000 mg Oral Daily  ? aspirin  81 mg Oral Daily  ? Chlorhexidine Gluconate Cloth  6 each Topical Q0600  ? darbepoetin (ARANESP) injection - DIALYSIS  200 mcg Intravenous Q Mon-HD  ? feeding supplement  237 mL Oral BID BM  ? fiber  1 packet Oral BID  ? folic acid  1 mg Oral Daily  ? guaiFENesin  400 mg Per Tube Q8H  ? heparin injection (subcutaneous)  5,000 Units Subcutaneous Q8H  ? insulin aspart  0-15 Units Subcutaneous Q4H  ? leptospermum manuka honey  1 application. Topical Daily  ? melatonin  3 mg Oral QHS  ? metoprolol tartrate  37.5 mg Oral TID  ? multivitamin  1 tablet Oral QHS  ? nutrition supplement (JUVEN)  1 packet Oral BID BM  ? oxyCODONE  5 mg Per Tube Once  ? pantoprazole sodium  40 mg Per Tube Daily  ? QUEtiapine  50 mg Oral QHS  ? sevelamer carbonate  2.4 g Oral Q8H  ? sodium chloride flush  3 mL Intravenous Q12H  ? sodium hypochlorite   Irrigation BID  ? zinc sulfate  220  mg Oral Daily  ? ?Elmarie Shiley, MD ?03/26/2022, 9:07 AM  ? ?

## 2022-03-26 NOTE — Progress Notes (Signed)
Nutrition Follow-up ? ?DOCUMENTATION CODES:  ? ?Non-severe (moderate) malnutrition in context of acute illness/injury ? ?INTERVENTION:  ? ?D/C calorie count. ? ?D/C Ensure. Change to Nepro Shake po BID, each supplement provides 425 kcal and 19 grams protein. ? ?Continue Juven 1 packet PO BID, each packet provides 80 calories, 8 grams of carbohydrate, 2.5  grams of protein (collagen), 7 grams of L-arginine and 7 grams of L-glutamine; supplement contains CaHMB, Vitamins C, E, B12 and Zinc to promote wound healing. ? ?Continue Prosource Plus 30 ml PO TID, each packet provides 100 kcal and 15 gm protein. ? ?Continue vitamin C supplement. ? ?Continue Renal MVI daily. ? ?NUTRITION DIAGNOSIS:  ? ?Moderate Malnutrition related to acute illness (septic shock, A fib, AKI) as evidenced by mild fat depletion, mild muscle depletion, moderate muscle depletion. ? ?Ongoing  ? ?GOAL:  ? ?Patient will meet greater than or equal to 90% of their needs ? ?Progressing ? ?MONITOR:  ? ?PO intake, Supplement acceptance, Labs, Skin ? ?REASON FOR ASSESSMENT:  ? ?Consult ?Diet education, Calorie Count (renal failure/carb modified) ? ?ASSESSMENT:  ? ?61 year old male who presented to the ED on 4/02 with N/V/D. PMH of HTN, PUD, eosinophilic esophagitis, kidney stones. Pt admitted with septic shock, new onset atrial fibrillation, AKI. ? ?04/03 - intubated, brief cardiac arrest ?04/06 - CRRT initiated ?04/10 - extubated, diet advanced to full liquids ?04/11 - diet advanced to regular ?04/17 - Cortrak placed, tube feeds started, s/p tunneled HD catheter ?04/23 - NPO at midnight, s/p L transmetatarsal amputation and R BKA ?04/24 - s/p debridement of dry gangrene of the anterior scrotum, remained intubated post-op ?04/25 - extubated, TF resumed  ?04/28- NG tube removed ?04/29- diet advanced to dysphagia 3 ? ?Plans for repeat debridement of scrotum on Saturday.  ?Patient seems more alert today, but still somewhat confused. He says that he has not been  drinking his supplements, but RN confirmed that he has been drinking Juven, Ensure, and Prosource Plus supplements as ordered. He says he is a meat and potatoes kind of guy and does not like the hospital food. His wife has been bringing in meals from outside of the hospital. ? ?48 hour calorie count initiated 5/2.  ?Diet: dysphagia 3 with thin liquids.  ?Supplements: Ensure Plus BID, Prosource Plus TID, Juven BID. ?Also receiving vitamin C, zinc to support wound healing. ? ?Meals: 2000 kcal, 85 gm protein. ?Supplements: 1060 kcal, 75 gm protein. ? ?Total: 3060 kcal, 160 gm protein. ? ?Intake for the past 24 hours meets at least 100% of minimum estimated protein and calorie requirements.  ? ?Labs reviewed. Phos 4.9 ?CBG: 117-105 ? ?Medications reviewed and include vitamin C, Aranesp, Nutrisource Fiber, folic acid, Novolog, Rena-vit, Renvela. ? ?Diet Order:   ?Diet Order   ? ?       ?  DIET DYS 3 Room service appropriate? Yes; Fluid consistency: Thin  Diet effective now       ?  ? ?  ?  ? ?  ? ? ?EDUCATION NEEDS:  ? ?No education needs have been identified at this time ? ?Skin:  Skin Assessment: Skin Integrity Issues: ?Skin Integrity Issues:: Stage II, Other (Comment), Wound VAC, Incisions ?Stage II: coccyx ?Wound Vac: R leg, L leg ?Incisions: scrotum ?Other: skin tear L thigh and R elbow ? ?Last BM:  5/4 ? ?Height:  ? ?Ht Readings from Last 1 Encounters:  ?03/15/22 '5\' 10"'$  (1.778 m)  ? ? ?Weight:  ? ?Wt Readings from Last 1 Encounters:  ?  03/25/22 133.4 kg  ? ? ?Ideal Body Weight:  70.6 kg ? ?BMI:  Body mass index is 42.18 kg/m?. ? ?Estimated Nutritional Needs:  ? ?Kcal:  2300-2500 ? ?Protein:  150-170 grams ? ?Fluid:  >/= 2.0 L ? ? ? ?Lucas Mallow RD, LDN, CNSC ?Please refer to Amion for contact information.                                                       ? ?

## 2022-03-26 NOTE — Progress Notes (Signed)
?PROGRESS NOTE ? ? ? ?Jeffrey Ayala  ZOX:[id number] DOB: [birth year] [name]: 02/22/2022 ?PCP: Patient, No Pcp Per (Inactive)  ?Chief Complaint  ?Patient presents with  ? Atrial Fibrillation  ? Nausea  ? ? ?Brief Narrative:  ?Jeffrey Ayala is Jeffrey Ayala 61 y.o. male with PMH significant for arthritis, bilateral knee replacement, gastric ulcer, kidney stones, eosinophilic esophagitis, migraines not on any regular meds.   ?Patient presented to the ED on 4/2 with complaint of nausea, vomiting, diarrhea and confusion for 24 hours.   ?EMS noted him to be in Jeffrey Ayala with Jeffrey Ayala heart rate of 120-160 bpm. ?In the ED, he had Jeffrey Ayala fever of 102.9. ?CT abdomen pelvis showed swollen scrotum concerning for infection, colon filled with diarrhea.  ?He was hypotensive requiring multiple vasopressors in the ICU.   ?4/3, patient required intubation and also had [name] brief cardiac arrest postintubation.   ?4/5, in an effort to correct vasoplegia, patient was also started on [name] ?Because of septic shock, patient's renal function worsened. ?4/6, patient was started on CRRT ?4/11, patient was extubated. ?4/14, patient was transferred out to [facility] service. ?4/23, underwent left transmetatarsal amputation and right BKA by Jeffrey Ayala ?4/24, scrotum debridement and subsequent ICU readmission ?4/27, transfer to [facility] service ?4/29, scrotal wound culture with E. Faecalis and MDR Pseudomonas aeruginosa; ID consulted and patient transitioned to Ampicillin, Avycaz and Flagyl ?5/1, increased lucidity. ID transitioned antibiotics to Augmentin and Zerbaxa ?   ? ? ?Assessment & Plan: ?  ?Principal Problem: ?  Septic shock ([name]) ?Active Problems: ?  Cardiac arrest [facility]) ?  Scrotal infection ?  Gangrene of right foot ([name]) ?  Gangrene of left foot ([name]) ?  AKI (acute kidney injury) ([name]) ?  Atrial fibrillation with RVR ([name]) ?  Altered mental status ?  Anemia ?  Diarrhea ?  Shock liver ?  Acute respiratory failure with hypoxia ([name]) ?  Gout ?  Acute pain ?   Demand ischemia ([name]) ?  Thrombocytopenia ([name]) ?  Malnutrition of moderate degree ?  Pressure injury of skin ?  Encounter for central line placement ?  Severe protein-calorie malnutrition ([name]) ?  Hypotension ?  Respiratory insufficiency ?  Cellulitis ?  Obesity, Class III, BMI 40-49.9 (morbid obesity) ([name]) ? ? ?Assessment and Plan: ?* Septic shock ([name]) ?Due to scrotal infection ?Required ICU admission with vasopressor support ? ?Cardiac arrest [facility]) ?"brief" cardiac arrest post intubation on 4/3 ?Echo with normal EF, no RWMA ?No additional plans for ischemic testing at this time per cards ? ? ?Scrotal infection ?Dry gangrene of anterior scrotum, s/p debridement on 4/24 ?MDR pseudomonas, enterococcus faecalis, bacteroides ovalis, beta lactamase positive ?Continue zyrbaxa and augmentin per ID ?Planning for repeat debridement in OR on Saturday per Dr. [name] ? ? ? ? ?Gangrene of left foot ([name]) ?S/p L TMA and right BKA with Jeffrey Ayala on 4/23 ?Per ID, no abx indicated at this time ? ?Gangrene of right foot ([name]) ?S/p L TMA and right BKA with Jeffrey Ayala on 4/23 ?Per ID, no abx indicated at this time ? ?AKI (acute kidney injury) ([name]) ?Thought 2/2 ATN from septic shock ?Anuric and dialysis dependent since 02/26/2022 ?Per renal ? ?Atrial fibrillation with RVR ([name]) ?New diagnosis. CHA2DS2-VASc Score is 1 (PAD).Transthoracic Echocardiogram with preserved LVEF. Mostly rate controlled but did have some associated tachycardia while agitated. Initially was managed on heparin IV which was discontinued secondary to anemia. Cardiology consulted. Patient started on metoprolol 37.5 mg TID and aspirin 81 mg.  ?-  Continue metoprolol 37.5 mg TID and aspirin 81 mg daily ?- needs outpatient cardiology follow up ? ?Altered mental status ?Unsure of specific etiology. Possibly secondary to critical illness but also complicated by uremia and concern for possible hypoxemic injury while in septic shock. CT head unremarkable for acute process.  Patient started on Seroquel for hopeful management with not much improvement. Patient with waxing and waning symptoms. Normal vitamin B1 and B12. Patient found to have likely smoldering infection that might have contributed to persistent mental status issues. ?-Delirium precautions ?-Continue Seroquel BID; will discontinue AM seroquel for now - follow and will deescalate/d/c as able ?-Haldol PRN ? ?Anemia ?Stable around 7.2 ?Will follow  ?Follow anemia labs ?S/p 4 units pRBC ? ?Diarrhea ?Negative c diff on 4/2 ?Negative gi path panel on 4/17 ?Has rectal tube in place ? ?Shock liver ?improved ? ?Acute respiratory failure with hypoxia ([name]) ?Patient intubated from 4/3 - 4/10, 4/23 - 4/23, 4/24 - 4/25. Resolved. ? ?Gout ?Now on allopurinol ?Colchicine has now been d/c'd ? ?Demand ischemia ([name]) ?In setting of septic shock and cardiac arrest ?Per cards, due to demand ischemia ? ?Thrombocytopenia ([name]) ?S/p 1 unit pRBC ? ?Malnutrition of moderate degree ?In setting of altered mentation. Dietitian consulted. Small bore NG tube placed on 4/17 and tube feeds initiated. Unfortunately, NG tube mistakenly removed on 4/28. SLP reevaluated on 4/29 with recommendation for dysphagia 3 diet. Currently undergoing calorie count - met caloric needs.  ?-SLP recommendations (4/29): ?  Diet recommendations: Dysphagia 3 (mechanical soft);Thin liquid ?Liquids provided via: Cup;Straw ?Medication Administration: Crushed with puree ?Supervision: Staff to assist with self feeding;Full supervision/cueing for compensatory strategies;Trained caregiver to feed patient ?Compensations: Minimize environmental distractions;Slow rate;Small sips/bites ?Postural Changes and/or Swallow Maneuvers: Seated upright 90 degrees  ? ? ? ?Pressure injury of skin ?Pressure Injury 02/22/22 Coccyx Medial Stage 2 -  Partial thickness loss of dermis presenting as [name] shallow open injury with Pinkney Venard red, pink wound bed without slough. pink, painful (Active)  ?02/22/22 2330   ?Location: Coccyx  ?Location Orientation: Medial  ?Staging: Stage 2 -  Partial thickness loss of dermis presenting as Brittnae Aschenbrenner shallow open injury with Jazir Newey red, pink wound bed without slough.  ?Wound Description (Comments): pink, painful  ?Present on Admission: Yes  ? ? ? ? ? ?DVT prophylaxis: heparin ?Code Status: full ?Family Communication: none ?Disposition:  ? ?Status is: Inpatient ?Remains inpatient appropriate because: need for continued abx, urology intervention ?  ?Consultants:  ?ID ?Urology ?PCCM ?Cardiology ?Nephrology ?Vascular surgery ? ?Procedures:  ?LEFT TRANSMETATARSAL AMPUTATION, RIGHT BELOW KNEE AMPUTATION (4/23) ?Debridement of scrotum (4/24) ?Echo ?IMPRESSIONS  ? ? ? 1. Left ventricular ejection fraction, by estimation, is 60 to 65%. The  ?left ventricle has normal function. The left ventricle has no regional  ?wall motion abnormalities. There is mild left ventricular hypertrophy.  ?Left ventricular diastolic function  ?could not be evaluated.  ? 2. Right ventricular systolic function is normal. The right ventricular  ?size is normal.  ? 3. The mitral valve is normal in structure. No evidence of mitral valve  ?regurgitation. No evidence of mitral stenosis.  ? 4. The aortic valve is tricuspid. Aortic valve regurgitation is not  ?visualized. No aortic stenosis is present.  ? 5. The inferior vena cava is dilated in size with <50% respiratory  ?variability, suggesting right atrial pressure of 15 mmHg.  ? ?Comparison(s): No prior Echocardiogram.  ? ?ABI  ?Summary:  ?Right: Resting right ankle-brachial index indicates severe right lower  ?extremity arterial disease.  ? ?  Unable to obtain TBI due to low amplitude waveforms.  ?Left: Resting left ankle-brachial index indicates noncompressible left  ?lower extremity arteries.  ? ?ABI ?Summary:  ?Right: Resting right ankle-brachial index indicates noncompressible right  ?lower extremity arteries.  ? ?Left: Resting left ankle-brachial index indicates noncompressible  left  ?lower extremity arteries. The left toe-brachial index is abnormal.  ? ?Antimicrobials:  ?Anti-infectives (From admission, onward)  ? ? Start     Dose/Rate Route Frequency Ordered Stop  ? 03/23/22 1900  c

## 2022-03-26 NOTE — Evaluation (Signed)
Occupational Therapy Evaluation ?Patient Details ?Name: Jeffrey Ayala ?MRN: [id number] ?DOB: 1960-11-30 ?Today's Date: 03/26/2022 ? ? ?History of Present Illness Pt adm 4/2 c/o N/V/D and found to have septic shock due to scrotal cellulitis vs RLL PNA.  Pt intubated 4/3 and brief cardiac arrest post intubation. [name] started 4/5.  Pt with AKI and started on CRRT on 4/6. Pt extubated 4/10. Due to gangrene of B LE, pt underwent R BKA and L transmet amputations on 4/23. On 4/24, pt underwent I&D of scrotum due to dry gangrene. Pt also with afib with rvr. PMH - Bil TKR, migraines  ? ?Clinical Impression ?  ?Pt supine in bed and agreeable to OT/PT session.  Completing bed mobility with mod assist +2 for safety, a/p transfers with max assist +2 into recliner.  He is able to complete grooming once seated in recliner with setup assist, LB ADLs from EOB with total assist +2.  He remains disoriented to time and situation, follows simple commands but remains limited by attention to task, recall, sequencing and problem solving.  Pt progressing well towards OT goals and highly recommend AIR once medically stable.  Will follow.  ?   ? ?Recommendations for follow up therapy are one component of a multi-disciplinary discharge planning process, led by the attending physician.  Recommendations may be updated based on patient status, additional functional criteria and insurance authorization.  ? ?Follow Up Recommendations ? Acute inpatient rehab (3hours/day)  ?  ?Assistance Recommended at Discharge Frequent or constant Supervision/Assistance  ?Patient can return home with the following Two people to help with walking and/or transfers;Two people to help with bathing/dressing/bathroom;Assistance with cooking/housework;Assistance with feeding;Direct supervision/assist for medications management;Direct supervision/assist for financial management;Assist for transportation;Help with stairs or ramp for entrance ? ?  ?Functional Status  Assessment ?    ?Equipment Recommendations ? Wheelchair (measurements OT);Wheelchair cushion (measurements OT);Hospital bed;BSC/3in1  ?  ?Recommendations for Other Services Rehab consult ? ? ?  ?Precautions / Restrictions Precautions ?Precautions: Fall;Other (comment) ?Precaution Comments: B LE wound vacs ?Restrictions ?Weight Bearing Restrictions: Yes ?RLE Weight Bearing: Non weight bearing ?LLE Weight Bearing: Non weight bearing  ? ?  ? ?Mobility Bed Mobility ?Overal bed mobility: Needs Assistance ?Bed Mobility: Supine to Sit ?  ?  ?Supine to sit: Mod assist, +2 for physical assistance, +2 for safety/equipment ?  ?  ?General bed mobility comments: pt transitioned from supine to long sitting from flat bed with mod assist, pivoting to EOB with mod assist +2 for trunk support pivoting and moving LEs. ?  ? ?Transfers ?  ?  ?  ?  ?  ?  ?  ?  ?  ?  ?  ? ?  ?Balance Overall balance assessment: Needs assistance ?Sitting-balance support: No upper extremity supported, Feet supported, Bilateral upper extremity supported ?Sitting balance-Leahy Scale: Fair ?Sitting balance - Comments: preference to UE support, cueing to maintain upright position with min guard for safety ?  ?  ?  ?Standing balance comment: pt unable ?  ?  ?  ?  ?  ?  ?  ?  ?  ?  ?  ?   ? ?ADL either performed or assessed with clinical judgement  ? ?ADL Overall ADL's : Needs assistance/impaired ?  ?  ?Grooming: Wash/dry face;Brushing hair;Set up;Sitting ?Grooming Details (indicate cue type and reason): sitting in recliner ?  ?  ?  ?  ?  ?  ?Lower Body Dressing: Total assistance;+2 for physical assistance;+2 for safety/equipment;Sitting/lateral leans ?  ?  Toilet Transfer: Maximal assistance;+2 for physical assistance;+2 for safety/equipment;Anterior/posterior ?Toilet Transfer Details (indicate cue type and reason): simulated into recliner ?  ?  ?  ?  ?Functional mobility during ADLs: +2 for physical assistance;+2 for safety/equipment;Maximal assistance ?    ? ? ? ?Vision   ?   ?   ?Perception   ?  ?Praxis   ?  ? ?Pertinent Vitals/Pain Pain Assessment ?Pain Assessment: Faces ?Faces Pain Scale: No hurt  ? ? ? ?Hand Dominance   ?  ?Extremity/Trunk Assessment   ?  ?  ?  ?  ?  ?Communication   ?  ?Cognition Arousal/Alertness: Awake/alert ?Behavior During Therapy: Flat affect ?Overall Cognitive Status: Impaired/Different from baseline ?Area of Impairment: Orientation, Memory, Attention, Following commands, Safety/judgement, Awareness, Problem solving ?  ?  ?  ?  ?  ?  ?  ?  ?Orientation Level: Disoriented to, Time, Situation ?Current Attention Level: Sustained ?Memory: Decreased recall of precautions, Decreased short-term memory ?Following Commands: Follows one step commands consistently, Follows one step commands with increased time, Follows multi-step commands inconsistently ?Safety/Judgement: Decreased awareness of safety, Decreased awareness of deficits ?Awareness: Intellectual ?Problem Solving: Slow processing, Decreased initiation, Difficulty sequencing, Requires verbal cues, Requires tactile cues ?General Comments: pt alert and engaging with therapists today. able to recall month/year after reorientation and 5 minutes.  Follows 1 step commands but poor awareness to safety, deficits and problem sovling. ?  ?  ?General Comments    ? ?  ?Exercises   ?  ?Shoulder Instructions    ? ? ?Home Living   ?  ?  ?  ?  ?  ?  ?  ?  ?  ?  ?  ?  ?  ?  ?  ?  ?  ?  ? ?  ?Prior Functioning/Environment   ?  ?  ?  ?  ?  ?  ?  ?  ?  ? ?  ?  ?OT Problem List:   ?  ?   ?OT Treatment/Interventions:    ?  ?OT Goals(Current goals can be found in the care plan section) Acute Rehab OT Goals ?Patient Stated Goal: get better ?OT Goal Formulation: With patient ?Time For Goal Achievement: 04/01/22 ?Potential to Achieve Goals: Good  ?OT Frequency: Min 2X/week ?  ? ?Co-evaluation PT/OT/SLP Co-Evaluation/Treatment: Yes ?Reason for Co-Treatment: Complexity of the patient's impairments (multi-system  involvement) ?  ?OT goals addressed during session: ADL's and self-care ?  ? ?  ?AM-PAC OT "6 Clicks" Daily Activity     ?Outcome Measure Help from another person eating meals?: A Little ?Help from another person taking care of personal grooming?: A Little ?Help from another person toileting, which includes using toliet, bedpan, or urinal?: Total ?Help from another person bathing (including washing, rinsing, drying)?: A Lot ?Help from another person to put on and taking off regular upper body clothing?: A Lot ?Help from another person to put on and taking off regular lower body clothing?: Total ?6 Click Score: 12 ?  ?End of Session Equipment Utilized During Treatment: Gait belt ?Nurse Communication: Mobility status;Need for lift equipment;Precautions ? ?Activity Tolerance: Patient tolerated treatment well ?Patient left: in chair;with call bell/phone within reach;with chair alarm set ? ?OT Visit Diagnosis: Other abnormalities of gait and mobility (R26.89);Muscle weakness (generalized) (M62.81)  ?              ?Time: 2482-5003 ?OT Time Calculation (min): 45 min ?Charges:  OT General Charges ?$OT Visit: 1 Visit ?OT Treatments ?$  Self Care/Home Management : 23-37 mins ? ?Jolaine Artist, OT ?Acute Rehabilitation Services ?Pager [phone] ?Office [phone] ? ? ?Delight Stare ?03/26/2022, 12:43 PM ?

## 2022-03-26 NOTE — Progress Notes (Signed)
Physical Therapy Treatment ?Patient Details ?Name: Jeffrey Ayala ?MRN: [id number] ?DOB: Apr 06, 1961 ?Today's Date: 03/26/2022 ? ? ?History of Present Illness Pt adm 4/2 c/o N/V/D and found to have septic shock due to scrotal cellulitis vs RLL PNA.  Pt intubated 4/3 and brief cardiac arrest post intubation. [name] started 4/5.  Pt with AKI and started on CRRT on 4/6. Pt extubated 4/10. Due to gangrene of B LE, pt underwent R BKA and L transmet amputations on 4/23. On 4/24, pt underwent I&D of scrotum due to dry gangrene. Pt also with afib with rvr. PMH - Bil TKR, migraines ? ?  ?PT Comments  ? ? Pt received in supine, alert and pleasantly agreeable to therapy session, not oriented to month/year but able to follow 1-step cues with improved attention this date and with some improvement with delayed recall technique for orientation questions. Pt making good progress toward goals, able to perform posterior seated scoot from air bed>recliner with up to +2 maxA and bed mobility with up to +2 modA. Pt with good tolerance for session with >30 minutes of total mobility including supine/seated exercises, weight shifting and transfer. Plan to work on transfers OOB to wheelchair and initiate wheelchair mobility training next session. Pt continues to benefit from PT services to progress toward functional mobility goals.   ?Recommendations for follow up therapy are one component of a multi-disciplinary discharge planning process, led by the attending physician.  Recommendations may be updated based on patient status, additional functional criteria and insurance authorization. ? ?Follow Up Recommendations ? Acute inpatient rehab (3hours/day) ?  ?  ?Assistance Recommended at Discharge Frequent or constant Supervision/Assistance  ?Patient can return home with the following Two people to help with walking and/or transfers;Two people to help with bathing/dressing/bathroom;Assistance with cooking/housework;Assistance with  feeding;Direct supervision/assist for medications management;Direct supervision/assist for financial management;Assist for transportation;Help with stairs or ramp for entrance ?  ?Equipment Recommendations ? Wheelchair (measurements PT);Wheelchair cushion (measurements PT);Hospital bed;Other (comment);BSC/3in1 (mechanical lift, pending progress; drop arm BSC; slide board)  ?  ?Recommendations for Other Services   ? ? ?  ?Precautions / Restrictions Precautions ?Precautions: Fall;Other (comment) ?Precaution Comments: B LE wound vacs ?Restrictions ?Weight Bearing Restrictions: Yes ?RLE Weight Bearing: Non weight bearing ?LLE Weight Bearing: Non weight bearing  ?  ? ?Mobility ? Bed Mobility ?Overal bed mobility: Needs Assistance ?Bed Mobility: Supine to Sit ?Rolling: Min guard ?  ?Supine to sit: Mod assist, +2 for physical assistance, +2 for safety/equipment ?  ?  ?General bed mobility comments: pt transitioned from supine to long sitting from flat bed with mod assist and L side rail, pivoting to EOB with mod assist +2 for trunk support pivoting and moving LEs. ?  ? ?Transfers ?Overall transfer level: Needs assistance ?  ?Transfers: Bed to chair/wheelchair/BSC ?  ?  ?  ?  ?Anterior-Posterior transfers: Max assist, +2 physical assistance, From elevated surface ?  ?General transfer comment: pt needing +2 maxA for posterior scooting in air bed with max cues for engaging BUE to elevate hips for scooting. Pt with improved technique once able to reach and use B arm rests of chair for final couple scoots. ?  ? ? ?  ?Balance Overall balance assessment: Needs assistance ?Sitting-balance support: No upper extremity supported, Feet supported, Bilateral upper extremity supported ?Sitting balance-Leahy Scale: Fair ?Sitting balance - Comments: preference to UE support, cueing to maintain upright position with min guard to minA for safety. Does better the longer he sits up, initially with some posterior  leans and c/o dizziness which  resolved within a few seconds. ?  ?  ?  ?Standing balance comment: N/A, pt NWB BLE ?  ?  ?  ?  ?  ?  ?  ?  ?  ?  ?  ?  ? ?  ?Cognition Arousal/Alertness: Awake/alert ?Behavior During Therapy: Flat affect ?Overall Cognitive Status: Impaired/Different from baseline ?Area of Impairment: Orientation, Memory, Attention, Following commands, Safety/judgement, Awareness, Problem solving ?  ?  ?  ?  ?  ?  ?  ?  ?Orientation Level: Disoriented to, Time, Situation ?Current Attention Level: Sustained ?Memory: Decreased recall of precautions, Decreased short-term memory ?Following Commands: Follows one step commands consistently, Follows one step commands with increased time, Follows multi-step commands inconsistently ?Safety/Judgement: Decreased awareness of safety, Decreased awareness of deficits ?Awareness: Intellectual ?Problem Solving: Slow processing, Decreased initiation, Difficulty sequencing, Requires verbal cues, Requires tactile cues ?General Comments: Pt alert and engaging with therapists today. He is able to recall month/year after reorientation and 5 minutes delayed recall.  Follows 1 step commands but poor awareness to safety, deficits and problem solving. Pt not able to state proper button for use of call bell but reoriented to use of proper button and RN notified he will likely need to be checked on frequently while in chair to ensure he is assisted back to bed before he gets too uncomfortable. ?  ?  ? ?  ?Exercises Other Exercises ?Other Exercises: lateral leans with elbow taps x5 reps each direction (minA for stability) ?Other Exercises: seated BLE AROM: quad sets, SLR, LLE LAQ x10 reps ea ? ?  ?General Comments General comments (skin integrity, edema, etc.): RN notified pt up in chair, will need frequent checks due to rectal tube/cognitive deficit, he may not be able to sit up >1 or 2 hours.; HR max 135 bpm; SpO2 WFL on RA ?  ?  ? ?Pertinent Vitals/Pain Pain Assessment ?Pain Assessment: Faces ?Faces Pain  Scale: Hurts a little bit ?Pain Location: low back with initial positioning in chair but after pt self-repositioned and chair slightly reclined, pt reports no pain. ?Pain Descriptors / Indicators: Discomfort ?Pain Intervention(s): Monitored during session, Repositioned  ? ? ?   ?   ? ?PT Goals (current goals can now be found in the care plan section) Acute Rehab PT Goals ?Patient Stated Goal: per wife to get him OOB. ?PT Goal Formulation: Patient unable to participate in goal setting ?Time For Goal Achievement: 04/01/22 ?Progress towards PT goals: Progressing toward goals ? ?  ?Frequency ? ? ? Min 3X/week ? ? ? ?  ?PT Plan Current plan remains appropriate  ? ? ?Co-evaluation PT/OT/SLP Co-Evaluation/Treatment: Yes ?Reason for Co-Treatment: Complexity of the patient's impairments (multi-system involvement);Necessary to address cognition/behavior during functional activity;For patient/therapist safety;To address functional/ADL transfers ?PT goals addressed during session: Mobility/safety with mobility;Balance;Strengthening/ROM ?OT goals addressed during session: ADL's and self-care ?  ? ?  ?AM-PAC PT "6 Clicks" Mobility   ?Outcome Measure ? Help needed turning from your back to your side while in a flat bed without using bedrails?: A Little ?Help needed moving from lying on your back to sitting on the side of a flat bed without using bedrails?: A Lot ?Help needed moving to and from a bed to a chair (including a wheelchair)?: A Lot ?Help needed standing up from a chair using your arms (e.g., wheelchair or bedside chair)?: Total ?Help needed to walk in hospital room?: Total ?Help needed climbing 3-5 steps with a railing? : Total ?  6 Click Score: 10 ? ?  ?End of Session Equipment Utilized During Treatment: Gait belt ?Activity Tolerance: Patient tolerated treatment well ?Patient left: in chair;with call bell/phone within reach;with chair alarm set;with nursing/sitter in room;Other (comment) (telesitter in room, maxi sky  lift pad under pt for ease of return transfer with nsg staff) ?Nurse Communication: Mobility status;Need for lift equipment;Precautions;Other (comment) (technique for return transfer (likely safest to use maxi sky li

## 2022-03-26 NOTE — Plan of Care (Signed)
?  Problem: Education: ?Goal: Knowledge of General Education information will improve ?Description: Including pain rating scale, medication(s)/side effects and non-pharmacologic comfort measures ?Outcome: Progressing ?  ?Problem: Health Behavior/Discharge Planning: ?Goal: Ability to manage health-related needs will improve ?Outcome: Progressing ?  ?Problem: Clinical Measurements: ?Goal: Ability to maintain clinical measurements within normal limits will improve ?Outcome: Progressing ?Goal: Will remain free from infection ?Outcome: Progressing ?Goal: Diagnostic test results will improve ?Outcome: Progressing ?Goal: Respiratory complications will improve ?Outcome: Progressing ?Goal: Cardiovascular complication will be avoided ?Outcome: Progressing ?  ?Problem: Activity: ?Goal: Risk for activity intolerance will decrease ?Outcome: Progressing ?  ?Problem: Nutrition: ?Goal: Adequate nutrition will be maintained ?Outcome: Progressing ?  ?Problem: Coping: ?Goal: Level of anxiety will decrease ?Outcome: Progressing ?  ?Problem: Elimination: ?Goal: Will not experience complications related to bowel motility ?Outcome: Progressing ?Goal: Will not experience complications related to urinary retention ?Outcome: Progressing ?  ?Problem: Pain Managment: ?Goal: General experience of comfort will improve ?Outcome: Progressing ?  ?Problem: Safety: ?Goal: Ability to remain free from injury will improve ?Outcome: Progressing ?  ?Problem: Skin Integrity: ?Goal: Risk for impaired skin integrity will decrease ?Outcome: Progressing ?  ?Problem: Fluid Volume: ?Goal: Hemodynamic stability will improve ?Outcome: Progressing ?  ?Problem: Clinical Measurements: ?Goal: Diagnostic test results will improve ?Outcome: Progressing ?Goal: Signs and symptoms of infection will decrease ?Outcome: Progressing ?  ?Problem: Respiratory: ?Goal: Ability to maintain adequate ventilation will improve ?Outcome: Progressing ?  ?Problem: Activity: ?Goal: Ability  to tolerate increased activity will improve ?Outcome: Progressing ?  ?Problem: Respiratory: ?Goal: Ability to maintain a clear airway and adequate ventilation will improve ?Outcome: Progressing ?  ?Problem: Role Relationship: ?Goal: Method of communication will improve ?Outcome: Progressing ?  ?

## 2022-03-27 DIAGNOSIS — A419 Sepsis, unspecified organism: Secondary | ICD-10-CM | POA: Diagnosis not present

## 2022-03-27 DIAGNOSIS — R6521 Severe sepsis with septic shock: Secondary | ICD-10-CM | POA: Diagnosis not present

## 2022-03-27 LAB — CBC WITH DIFFERENTIAL/PLATELET
Abs Immature Granulocytes: 0.05 10*3/uL (ref 0.00–0.07)
Basophils Absolute: 0 10*3/uL (ref 0.0–0.1)
Basophils Relative: 0 %
Eosinophils Absolute: 0 10*3/uL (ref 0.0–0.5)
Eosinophils Relative: 0 %
HCT: 22.7 % — ABNORMAL LOW (ref 39.0–52.0)
Hemoglobin: 7.3 g/dL — ABNORMAL LOW (ref 13.0–17.0)
Immature Granulocytes: 1 %
Lymphocytes Relative: 21 %
Lymphs Abs: 1.2 10*3/uL (ref 0.7–4.0)
MCH: 30 pg (ref 26.0–34.0)
MCHC: 32.2 g/dL (ref 30.0–36.0)
MCV: 93.4 fL (ref 80.0–100.0)
Monocytes Absolute: 0.8 10*3/uL (ref 0.1–1.0)
Monocytes Relative: 13 %
Neutro Abs: 3.8 10*3/uL (ref 1.7–7.7)
Neutrophils Relative %: 65 %
Platelets: 236 10*3/uL (ref 150–400)
RBC: 2.43 MIL/uL — ABNORMAL LOW (ref 4.22–5.81)
RDW: 15.4 % (ref 11.5–15.5)
WBC: 5.9 10*3/uL (ref 4.0–10.5)
nRBC: 0 % (ref 0.0–0.2)

## 2022-03-27 LAB — GLUCOSE, CAPILLARY
Glucose-Capillary: 104 mg/dL — ABNORMAL HIGH (ref 70–99)
Glucose-Capillary: 107 mg/dL — ABNORMAL HIGH (ref 70–99)
Glucose-Capillary: 131 mg/dL — ABNORMAL HIGH (ref 70–99)
Glucose-Capillary: 137 mg/dL — ABNORMAL HIGH (ref 70–99)
Glucose-Capillary: 79 mg/dL (ref 70–99)
Glucose-Capillary: 98 mg/dL (ref 70–99)

## 2022-03-27 LAB — COMPREHENSIVE METABOLIC PANEL
ALT: 13 U/L (ref 0–44)
AST: 26 U/L (ref 15–41)
Albumin: 2.4 g/dL — ABNORMAL LOW (ref 3.5–5.0)
Alkaline Phosphatase: 97 U/L (ref 38–126)
Anion gap: 10 (ref 5–15)
BUN: 50 mg/dL — ABNORMAL HIGH (ref 8–23)
CO2: 26 mmol/L (ref 22–32)
Calcium: 8.3 mg/dL — ABNORMAL LOW (ref 8.9–10.3)
Chloride: 104 mmol/L (ref 98–111)
Creatinine, Ser: 6.46 mg/dL — ABNORMAL HIGH (ref 0.61–1.24)
GFR, Estimated: 9 mL/min — ABNORMAL LOW (ref >60)
Glucose, Bld: 103 mg/dL — ABNORMAL HIGH (ref 70–99)
Potassium: 3.8 mmol/L (ref 3.5–5.1)
Sodium: 140 mmol/L (ref 135–145)
Total Bilirubin: 1.3 mg/dL — ABNORMAL HIGH (ref 0.3–1.2)
Total Protein: 6.9 g/dL (ref 6.5–8.1)

## 2022-03-27 LAB — MIN INHIBITORY CONC (2 DRUGS)

## 2022-03-27 LAB — PHOSPHORUS: Phosphorus: 6.3 mg/dL — ABNORMAL HIGH (ref 2.5–4.6)

## 2022-03-27 LAB — MAGNESIUM: Magnesium: 2.3 mg/dL (ref 1.7–2.4)

## 2022-03-27 LAB — MIC RESULTS (2 DRUGS)

## 2022-03-27 NOTE — Progress Notes (Signed)
?PROGRESS NOTE ? ? ? ?Jeffrey Ayala  MWN:[id number] DOB: 10-Aug-1961 [name]: 02/22/2022 ?PCP: Patient, No Pcp Per (Inactive)  ?Chief Complaint  ?Patient presents with  ? Atrial Fibrillation  ? Nausea  ? ? ?Brief Narrative:  ?Jeffrey Ayala is Jeffrey Ayala 61 y.o. male with PMH significant for arthritis, bilateral knee replacement, gastric ulcer, kidney stones, eosinophilic esophagitis, migraines not on any regular meds.   ?Patient presented to the ED on 4/2 with complaint of nausea, vomiting, diarrhea and confusion for 24 hours.   ?EMS noted him to be in Jeffrey Ayala with Jeffrey Ayala heart rate of 120-160 bpm. ?In the ED, he had Jeffrey Sautter Ayala of 102.9. ?CT abdomen pelvis showed swollen scrotum concerning for infection, colon filled with diarrhea.  ?He was hypotensive requiring multiple vasopressors in the ICU.   ?4/3, patient required intubation and also had Jeffrey Ayala brief cardiac arrest postintubation.   ?4/5, in an effort to correct vasoplegia, patient was also started on Jeffrey Ayala infusion ?Because of septic shock, patient's renal function worsened. ?4/6, patient was started on CRRT ?4/11, patient was extubated. ?4/14, patient was transferred out to Jeffrey Ayala service. ?4/23, underwent left transmetatarsal amputation and right BKA by Dr. Lajoyce Ayala ?4/24, scrotum debridement and subsequent ICU readmission ?4/27, transfer to [facility] service ?4/29, scrotal wound culture with E. Faecalis and MDR Pseudomonas aeruginosa; ID consulted and patient transitioned to Ampicillin, Avycaz and Flagyl ?5/1, increased lucidity. ID transitioned antibiotics to Augmentin and Zerbaxa ?   ? ? ?Assessment & Plan: ?  ?Principal Problem: ?  Septic shock ([name]) ?Active Problems: ?  Cardiac arrest [facility]) ?  Scrotal infection ?  Gangrene of right foot ([name]) ?  Gangrene of left foot ([name]) ?  AKI (acute kidney injury) ([name]) ?  Atrial fibrillation with RVR ([name]) ?  Altered mental status ?  Anemia ?  Diarrhea ?  Shock liver ?  Acute respiratory failure with hypoxia ([name]) ?  Gout ?  Acute pain ?   Demand ischemia ([name]) ?  Thrombocytopenia ([name]) ?  Malnutrition of moderate degree ?  Pressure injury of skin ?  Encounter for central line placement ?  Severe protein-calorie malnutrition ([name]) ?  Hypotension ?  Respiratory insufficiency ?  Cellulitis ?  Obesity, Class III, BMI 40-49.9 (morbid obesity) ([name]) ? ? ?Assessment and Plan: ?* Septic shock ([name]) ?Due to scrotal infection ?Required ICU admission with vasopressor support ? ?Cardiac arrest [facility]) ?"brief" cardiac arrest post intubation on 4/3 ?Echo with normal EF, no RWMA ?No additional plans for ischemic testing at this time per cards ? ? ?Scrotal infection ?Dry gangrene of anterior scrotum, s/p debridement on 4/24 ?MDR pseudomonas, enterococcus faecalis, bacteroides ovalis, beta lactamase positive ?Continue zerbaxa and augmentin per ID ?Planning for repeat debridement in OR on Saturday per Dr. Laverle Ayala ? ? ? ? ?Gangrene of left foot ([name]) ?S/p L TMA and right BKA with Dr. Lajoyce Ayala on 4/23 ?Per ID, no abx indicated at this time ? ?Gangrene of right foot ([name]) ?S/p L TMA and right BKA with Dr. Lajoyce Ayala on 4/23 ?Per ID, no abx indicated at this time ? ?AKI (acute kidney injury) ([name]) ?Thought 2/2 ATN from septic shock ?Anuric and dialysis dependent since 02/26/2022 ?Per renal ? ?Atrial fibrillation with RVR ([name]) ?New diagnosis. CHA2DS2-VASc Score is 1 (PAD).Transthoracic Echocardiogram with preserved LVEF. Mostly rate controlled but did have some associated tachycardia while agitated. Initially was managed on heparin IV which was discontinued secondary to anemia. Cardiology consulted. Patient started on metoprolol 37.5 mg TID and aspirin 81 mg.  ?-  Continue metoprolol 37.5 mg TID and aspirin 81 mg daily ?- needs outpatient cardiology follow up ? ?Altered mental status ?Unsure of specific etiology. Possibly secondary to critical illness but also complicated by uremia and concern for possible hypoxemic injury while in septic shock. CT head unremarkable for acute process.  Patient started on Seroquel for hopeful management with not much improvement. Patient with waxing and waning symptoms. Normal vitamin B1 and B12. Patient found to have likely smoldering infection that might have contributed to persistent mental status issues. ?-Delirium precautions ?-Continue Seroquel BID; will discontinue AM seroquel for now - follow and will deescalate/d/c as able ?-Haldol PRN ? ?Anemia ?Stable around 7.2 ?Will follow  ?Follow anemia labs ?S/p 4 units pRBC ? ?Diarrhea ?Negative c diff on 4/2 ?Negative gi path panel on 4/17 ?Has rectal tube in place ? ?Shock liver ?improved ? ?Acute respiratory failure with hypoxia ([name]) ?Patient intubated from 4/3 - 4/10, 4/23 - 4/23, 4/24 - 4/25. Resolved. ? ?Gout ?Now on allopurinol ?Colchicine has now been d/c'd ? ?Demand ischemia ([name]) ?In setting of septic shock and cardiac arrest ?Per cards, due to demand ischemia ? ?Thrombocytopenia ([name]) ?S/p 1 unit platelets ? ?Malnutrition of moderate degree ?In setting of altered mentation. Dietitian consulted. Small bore NG tube placed on 4/17 and tube feeds initiated. Unfortunately, NG tube mistakenly removed on 4/28. SLP reevaluated on 4/29 with recommendation for dysphagia 3 diet. Currently undergoing calorie count - met caloric needs.  ?-SLP recommendations (4/29): ?  Diet recommendations: Dysphagia 3 (mechanical soft);Thin liquid ?Liquids provided via: Cup;Straw ?Medication Administration: Crushed with puree ?Supervision: Staff to assist with self feeding;Full supervision/cueing for compensatory strategies;Trained caregiver to feed patient ?Compensations: Minimize environmental distractions;Slow rate;Small sips/bites ?Postural Changes and/or Swallow Maneuvers: Seated upright 90 degrees  ? ? ? ?Pressure injury of skin ?Pressure Injury 02/22/22 Coccyx Medial Stage 2 -  Partial thickness loss of dermis presenting as Jeffrey [place] shallow open injury with Jester Klingberg red, pink wound bed without slough. pink, painful (Active)  ?02/22/22  2330  ?Location: Coccyx  ?Location Orientation: Medial  ?Staging: Stage 2 -  Partial thickness loss of dermis presenting as Sol Odor shallow open injury with Remy Voiles red, pink wound bed without slough.  ?Wound Description (Comments): pink, painful  ?Present on Admission: Yes  ? ? ? ? ? ?DVT prophylaxis: heparin ?Code Status: full ?Family Communication: none ?Disposition:  ? ?Status is: Inpatient ?Remains inpatient appropriate because: need for continued abx, urology intervention ?  ?Consultants:  ?ID ?Urology ?PCCM ?Cardiology ?Nephrology ?Vascular surgery ? ?Procedures:  ?LEFT TRANSMETATARSAL AMPUTATION, RIGHT BELOW KNEE AMPUTATION (4/23) ?Debridement of scrotum (4/24) ?Echo ?IMPRESSIONS  ? ? ? 1. Left ventricular ejection fraction, by estimation, is 60 to 65%. The  ?left ventricle has normal function. The left ventricle has no regional  ?wall motion abnormalities. There is mild left ventricular hypertrophy.  ?Left ventricular diastolic function  ?could not be evaluated.  ? 2. Right ventricular systolic function is normal. The right ventricular  ?size is normal.  ? 3. The mitral valve is normal in structure. No evidence of mitral valve  ?regurgitation. No evidence of mitral stenosis.  ? 4. The aortic valve is tricuspid. Aortic valve regurgitation is not  ?visualized. No aortic stenosis is present.  ? 5. The inferior vena cava is dilated in size with <50% respiratory  ?variability, suggesting right atrial pressure of 15 mmHg.  ? ?Comparison(s): No prior Echocardiogram.  ? ?ABI  ?Summary:  ?Right: Resting right ankle-brachial index indicates severe right lower  ?extremity arterial disease.  ? ?  Unable to obtain TBI due to low amplitude waveforms.  ?Left: Resting left ankle-brachial index indicates noncompressible left  ?lower extremity arteries.  ? ?ABI ?Summary:  ?Right: Resting right ankle-brachial index indicates noncompressible right  ?lower extremity arteries.  ? ?Left: Resting left ankle-brachial index indicates  noncompressible left  ?lower extremity arteries. The left toe-brachial index is abnormal.  ? ?Antimicrobials:  ?Anti-infectives (From admission, onward)  ? ? Start     Dose/Rate Route Frequency Ordered Stop  ? 03/23/22 19

## 2022-03-27 NOTE — Procedures (Signed)
Patient seen on Hemodialysis where he is comfortable and denies any events from overnight. ?BP 140/80   Pulse 86   Temp 98.2 ?F (36.8 ?C)   Resp 16   Ht '5\' 10"'$  (1.778 m)   Wt 115.7 kg   SpO2 96%   BMI 36.59 kg/m?   ?QB 400, UF goal 2.5L ?Continuing dialysis on a MWF schedule and with plans for groin/scrotal wound debridement today. ? ? ?Elmarie Shiley MD ?[facility]. ?Office # [phone] ?Pager # [phone] ?8:58 AM ? ?

## 2022-03-27 NOTE — Anesthesia Preprocedure Evaluation (Addendum)
Anesthesia Evaluation  ?Patient identified by MRN, date of birth, ID band ?Patient awake ? ? ? ?Reviewed: ?Allergy & Precautions, NPO status , Patient's Chart, lab work & pertinent test results ? ?History of Anesthesia Complications ?Negative for: history of anesthetic complications ? ?Airway ?Mallampati: II ? ?TM Distance: >3 FB ?Neck ROM: Full ? ? ? Dental ?no notable dental hx. ? ?  ?Pulmonary ?neg pulmonary ROS,  ?  ?Pulmonary exam normal ? ? ? ? ? ? ? Cardiovascular ?hypertension, Pt. on medications ? ?Rhythm:Regular Rate:Normal ? ?Echo 02/2022 ?1. Left ventricular ejection fraction, by estimation, is 60 to 65%. The left ventricle has normal function. The left ventricle has no regional wall motion abnormalities. There is mild left ventricular hypertrophy. Left ventricular diastolic function could not be evaluated.  ??2. Right ventricular systolic function is normal. The right ventricular size is normal.  ??3. The mitral valve is normal in structure. No evidence of mitral valve regurgitation. No evidence of mitral stenosis.  ??4. The aortic valve is tricuspid. Aortic valve regurgitation is not visualized. No aortic stenosis is present.  ??5. The inferior vena cava is dilated in size with <50% respiratory variability, suggesting right atrial pressure of 15 mmHg.  ?  ?Neuro/Psych ? Headaches, PSYCHIATRIC DISORDERS Depression   ? GI/Hepatic ?PUD, GERD  Medicated,  ?Endo/Other  ?Morbid obesityLab Results ?     Component                Value               Date                 ?     HGBA1C                   5.5                 02/23/2022           ? ? Renal/GU ?ARF and CRFRenal disease  ? ?Scrotal infection ? ?  ?Musculoskeletal ? ?(+) Arthritis , gangrene bilateral feet  ? Abdominal ?Normal abdominal exam  (+)   ?Peds ? Hematology ? ?(+) Blood dyscrasia, anemia , Lab Results ?     Component                Value               Date                 ?     WBC                      5.9                  03/27/2022           ?     HGB                      7.3 (L)             03/27/2022           ?     HCT                      22.7 (L)            03/27/2022           ?     MCV  93.4                03/27/2022           ?     PLT                      236                 03/27/2022           ?Lab Results ?     Component                Value               Date                 ?     NA                       140                 03/27/2022           ?     K                        3.8                 03/27/2022           ?     CO2                      26                  03/27/2022           ?     GLUCOSE                  103 (H)             03/27/2022           ?     BUN                      50 (H)              03/27/2022           ?     CREATININE               6.46 (H)            03/27/2022           ?     CALCIUM                  8.3 (L)             03/27/2022           ?     GFRNONAA                 9 (L)               03/27/2022             ?Anesthesia Other Findings ? ? Reproductive/Obstetrics ? ?  ? ? ? ? ? ? ? ? ? ? ? ? ? ?  ?  ? ? ? ? ? ? ?Anesthesia Physical ? ?Anesthesia Plan ? ?ASA: 3 ? ?Anesthesia Plan: General  ? ?Post-op Pain Management:   ? ?  Induction: Intravenous ? ?PONV Risk Score and Plan: 2 and Ondansetron, Dexamethasone and Treatment may vary due to age or medical condition ? ?Airway Management Planned: Oral ETT and Mask ? ?Additional Equipment: None ? ?Intra-op Plan:  ? ?Post-operative Plan: Extubation in OR ? ?Informed Consent: I have reviewed the patients History and Physical, chart, labs and discussed the procedure including the risks, benefits and alternatives for the proposed anesthesia with the patient or authorized representative who has indicated his/her understanding and acceptance.  ? ? ? ?Dental advisory given ? ?Plan Discussed with: CRNA ? ?Anesthesia Plan Comments:   ? ? ? ? ? ?Anesthesia Quick Evaluation ? ?

## 2022-03-27 NOTE — Progress Notes (Signed)
Patient ID: Jeffrey Ayala, male   DOB: [birth year], 61 y.o.   MRN: [id number] ? ?11 Days Post-Op ?Subjective: ?Pt with much less confusion.  Denies pain in scrotum. ? ?Objective: ?Vital signs in last 24 hours: ?Temp:  [98 ?F (36.7 ?C)-98.6 ?F (37 ?C)] 98.6 ?F (37 ?C) (05/05 0430) ?Pulse Rate:  [76-116] 86 (05/05 0430) ?Resp:  [15-20] 18 (05/05 0430) ?BP: (113-151)/(65-94) 123/94 (05/05 0430) ?SpO2:  [98 %-100 %] 99 % (05/05 0430) ?Weight:  [133.4 kg] 133.4 kg (05/05 0430) ? ?Intake/Output from previous day: ?05/04 0701 - 05/05 0700 ?In: 1.1 [IV Piggyback:1.1] ?Out: 0  ?Intake/Output this shift: ?No intake/output data recorded. ? ?Physical Exam:  ?General: Alert and oriented ?GU: Dressing not in place this morning. I therefore changed the dressing. Increasing fibrinous exudative tissue around testes and especially toward right upper wound.  No erythema or fluctuance in surrounding tissues. ? ?Lab Results: ?Recent Labs  ?  03/25/22 ?0745 03/26/22 ?0151 03/27/22 ?0217  ?HGB 7.2* 7.3* 7.3*  ?HCT 22.4* 23.4* 22.7*  ? ? ?  Latest Ref Rng & Units 03/27/2022  ?  2:17 AM 03/26/2022  ?  1:51 AM 03/25/2022  ?  7:45 AM  ?CBC  ?WBC 4.0 - 10.5 K/uL 5.9   5.0   5.9    ?Hemoglobin 13.0 - 17.0 g/dL 7.3   7.3   7.2    ?Hematocrit 39.0 - 52.0 % 22.7   23.4   22.4    ?Platelets 150 - 400 K/uL 236   239   215    ? ? ? ?BMET ?Recent Labs  ?  03/26/22 ?0151 03/27/22 ?0217  ?NA 139 140  ?K 3.6 3.8  ?CL 103 104  ?CO2 27 26  ?GLUCOSE 98 103*  ?BUN 34* 50*  ?CREATININE 4.81* 6.46*  ?CALCIUM 8.3* 8.3*  ? ? ? ?Studies/Results: ?No results found. ? ?Assessment/Plan: ?1) Scrotal infection:  S/P initial debridement on 4/24.  Wound with increasing fibrinous tissue and lack of granulation tissue.  Will need repeat debridement in OR.  Plan for dialysis today.  Will scheduled for I&D in OR tomorrow morning.  NPO after MN tonight.  There is slight chance he may eventually require orchiectomy.  Discussed with him and his wife.  I discussed the potential  benefits and risks of the procedure, side effects of the proposed treatment, the likelihood of the patient achieving the goals of the procedure, and any potential problems that might occur during the procedure or recuperation.  ? ? ? LOS: 33 days  ? ?[name] ?03/27/2022, 6:49 AM ? ?  ?

## 2022-03-27 NOTE — Procedures (Signed)
Pt arrived to hemodialysis with BP of 121/80, all items removed from bed to determine wt. of 255 lbs/115.9kg.  Permcath dressing intact with each port flushing and drawing easily.  Pt sleeping throughout treatment with no signs of discomfort noted.  Following completion of treatment Pt remains stable with BP of 144/81 and removed  2000 mls UF.  No medication given ?

## 2022-03-28 ENCOUNTER — Other Ambulatory Visit: Payer: Self-pay

## 2022-03-28 ENCOUNTER — Inpatient Hospital Stay (HOSPITAL_COMMUNITY): Payer: BC Managed Care – PPO | Admitting: Anesthesiology

## 2022-03-28 ENCOUNTER — Encounter (HOSPITAL_COMMUNITY): Payer: Self-pay | Admitting: Pulmonary Disease

## 2022-03-28 ENCOUNTER — Encounter (HOSPITAL_COMMUNITY)
Admission: EM | Disposition: A | Discharge status: Rehabilitation Facility | Payer: Self-pay | Source: Home / Self Care | Attending: Internal Medicine

## 2022-03-28 DIAGNOSIS — R6521 Severe sepsis with septic shock: Secondary | ICD-10-CM | POA: Diagnosis not present

## 2022-03-28 DIAGNOSIS — A419 Sepsis, unspecified organism: Secondary | ICD-10-CM | POA: Diagnosis not present

## 2022-03-28 HISTORY — PX: SCROTAL EXPLORATION: SHX2386

## 2022-03-28 LAB — COMPREHENSIVE METABOLIC PANEL
ALT: 14 U/L (ref 0–44)
AST: 27 U/L (ref 15–41)
Albumin: 2.5 g/dL — ABNORMAL LOW (ref 3.5–5.0)
Alkaline Phosphatase: 96 U/L (ref 38–126)
Anion gap: 10 (ref 5–15)
BUN: 28 mg/dL — ABNORMAL HIGH (ref 8–23)
CO2: 26 mmol/L (ref 22–32)
Calcium: 8.4 mg/dL — ABNORMAL LOW (ref 8.9–10.3)
Chloride: 103 mmol/L (ref 98–111)
Creatinine, Ser: 4.67 mg/dL — ABNORMAL HIGH (ref 0.61–1.24)
GFR, Estimated: 13 mL/min — ABNORMAL LOW (ref >60)
Glucose, Bld: 105 mg/dL — ABNORMAL HIGH (ref 70–99)
Potassium: 3.7 mmol/L (ref 3.5–5.1)
Sodium: 139 mmol/L (ref 135–145)
Total Bilirubin: 1.4 mg/dL — ABNORMAL HIGH (ref 0.3–1.2)
Total Protein: 7 g/dL (ref 6.5–8.1)

## 2022-03-28 LAB — GLUCOSE, CAPILLARY
Glucose-Capillary: 118 mg/dL — ABNORMAL HIGH (ref 70–99)
Glucose-Capillary: 136 mg/dL — ABNORMAL HIGH (ref 70–99)

## 2022-03-28 LAB — MAGNESIUM: Magnesium: 2 mg/dL (ref 1.7–2.4)

## 2022-03-28 LAB — CBC WITH DIFFERENTIAL/PLATELET
Abs Immature Granulocytes: 0.03 10*3/uL (ref 0.00–0.07)
Basophils Absolute: 0 10*3/uL (ref 0.0–0.1)
Basophils Relative: 0 %
Eosinophils Absolute: 0 10*3/uL (ref 0.0–0.5)
Eosinophils Relative: 0 %
HCT: 24.5 % — ABNORMAL LOW (ref 39.0–52.0)
Hemoglobin: 7.7 g/dL — ABNORMAL LOW (ref 13.0–17.0)
Immature Granulocytes: 1 %
Lymphocytes Relative: 17 %
Lymphs Abs: 1 10*3/uL (ref 0.7–4.0)
MCH: 29.2 pg (ref 26.0–34.0)
MCHC: 31.4 g/dL (ref 30.0–36.0)
MCV: 92.8 fL (ref 80.0–100.0)
Monocytes Absolute: 0.7 10*3/uL (ref 0.1–1.0)
Monocytes Relative: 12 %
Neutro Abs: 4.4 10*3/uL (ref 1.7–7.7)
Neutrophils Relative %: 70 %
Platelets: 255 10*3/uL (ref 150–400)
RBC: 2.64 MIL/uL — ABNORMAL LOW (ref 4.22–5.81)
RDW: 15.4 % (ref 11.5–15.5)
WBC: 6.1 10*3/uL (ref 4.0–10.5)
nRBC: 0 % (ref 0.0–0.2)

## 2022-03-28 LAB — PHOSPHORUS: Phosphorus: 4.8 mg/dL — ABNORMAL HIGH (ref 2.5–4.6)

## 2022-03-28 SURGERY — EXPLORATION, SCROTUM
Anesthesia: General

## 2022-03-28 MED ORDER — ONDANSETRON HCL 4 MG/2ML IJ SOLN
INTRAMUSCULAR | Status: DC | PRN
  Administered 2022-03-28: 4 mg via INTRAVENOUS

## 2022-03-28 MED ORDER — DILTIAZEM LOAD VIA INFUSION
15.0000 mg | Freq: Once | INTRAVENOUS | Status: AC
  Administered 2022-03-28: 10 mg via INTRAVENOUS
  Filled 2022-03-28: qty 15

## 2022-03-28 MED ORDER — FENTANYL CITRATE (PF) 100 MCG/2ML IJ SOLN: 25.0000 ug | INTRAMUSCULAR | Status: DC | PRN

## 2022-03-28 MED ORDER — FENTANYL CITRATE (PF) 250 MCG/5ML IJ SOLN
INTRAMUSCULAR | Status: DC | PRN
  Administered 2022-03-28: 25 ug via INTRAVENOUS
  Administered 2022-03-28: 100 ug via INTRAVENOUS
  Administered 2022-03-28 (×2): 25 ug via INTRAVENOUS
  Administered 2022-03-28: 50 ug via INTRAVENOUS
  Administered 2022-03-28: 25 ug via INTRAVENOUS

## 2022-03-28 MED ORDER — FENTANYL CITRATE (PF) 250 MCG/5ML IJ SOLN
INTRAMUSCULAR | Status: AC
  Filled 2022-03-28: qty 5

## 2022-03-28 MED ORDER — HYDROMORPHONE HCL 1 MG/ML IJ SOLN
0.5000 mg | Freq: Once | INTRAMUSCULAR | Status: AC
  Administered 2022-03-28: 0.5 mg via INTRAVENOUS
  Filled 2022-03-28: qty 0.5

## 2022-03-28 MED ORDER — PHENYLEPHRINE 80 MCG/ML (10ML) SYRINGE FOR IV PUSH (FOR BLOOD PRESSURE SUPPORT)
PREFILLED_SYRINGE | INTRAVENOUS | Status: DC | PRN
Start: 2022-03-28 — End: 2022-03-28
  Administered 2022-03-28 (×3): 240 ug via INTRAVENOUS

## 2022-03-28 MED ORDER — CHLORHEXIDINE GLUCONATE 0.12 % MT SOLN
15.0000 mL | Freq: Once | OROMUCOSAL | Status: AC
  Administered 2022-03-28: 15 mL via OROMUCOSAL

## 2022-03-28 MED ORDER — DILTIAZEM HCL-DEXTROSE 125-5 MG/125ML-% IV SOLN (PREMIX)
5.0000 mg/h | INTRAVENOUS | Status: DC
  Administered 2022-03-28: 5 mg/h via INTRAVENOUS
  Filled 2022-03-28 (×2): qty 125

## 2022-03-28 MED ORDER — LABETALOL HCL 5 MG/ML IV SOLN
5.0000 mg | Freq: Once | INTRAVENOUS | Status: AC
  Administered 2022-03-28: 5 mg via INTRAVENOUS

## 2022-03-28 MED ORDER — PHENYLEPHRINE HCL-NACL 20-0.9 MG/250ML-% IV SOLN
INTRAVENOUS | Status: DC | PRN
  Administered 2022-03-28: 25 ug/min via INTRAVENOUS

## 2022-03-28 MED ORDER — DEXAMETHASONE SODIUM PHOSPHATE 10 MG/ML IJ SOLN
INTRAMUSCULAR | Status: DC | PRN
  Administered 2022-03-28: 5 mg via INTRAVENOUS

## 2022-03-28 MED ORDER — CHLORHEXIDINE GLUCONATE 0.12 % MT SOLN
OROMUCOSAL | Status: AC
  Administered 2022-03-28: 15 mL via OROMUCOSAL
  Filled 2022-03-28: qty 15

## 2022-03-28 MED ORDER — ALBUMIN HUMAN 5 % IV SOLN: INTRAVENOUS | Status: DC | PRN

## 2022-03-28 MED ORDER — LABETALOL HCL 5 MG/ML IV SOLN
5.0000 mg | Freq: Once | INTRAVENOUS | Status: DC
Start: 2022-03-28 — End: 2022-03-28

## 2022-03-28 MED ORDER — PROPOFOL 10 MG/ML IV BOLUS
INTRAVENOUS | Status: DC | PRN
  Administered 2022-03-28: 160 mg via INTRAVENOUS

## 2022-03-28 MED ORDER — ORAL CARE MOUTH RINSE: 15.0000 mL | Freq: Once | OROMUCOSAL | Status: AC

## 2022-03-28 MED ORDER — SODIUM CHLORIDE 0.9 % IV SOLN: INTRAVENOUS | Status: DC

## 2022-03-28 MED ORDER — HYDROMORPHONE HCL 1 MG/ML IJ SOLN
1.0000 mg | INTRAMUSCULAR | Status: DC | PRN
Start: 2022-03-28 — End: 2022-04-03
  Administered 2022-03-28 (×2): 1 mg via INTRAVENOUS
  Filled 2022-03-28 (×2): qty 1

## 2022-03-28 MED ORDER — HYDROMORPHONE HCL 1 MG/ML IJ SOLN
0.5000 mg | INTRAMUSCULAR | Status: DC | PRN
Start: 2022-03-28 — End: 2022-03-28
  Administered 2022-03-28: 0.5 mg via INTRAVENOUS
  Filled 2022-03-28: qty 0.5

## 2022-03-28 MED ORDER — LIDOCAINE 2% (20 MG/ML) 5 ML SYRINGE
INTRAMUSCULAR | Status: DC | PRN
Start: 2022-03-28 — End: 2022-03-28
  Administered 2022-03-28: 60 mg via INTRAVENOUS

## 2022-03-28 MED ORDER — MIDAZOLAM HCL 2 MG/2ML IJ SOLN
INTRAMUSCULAR | Status: AC
  Filled 2022-03-28: qty 2

## 2022-03-28 MED ORDER — PROPOFOL 10 MG/ML IV BOLUS
INTRAVENOUS | Status: AC
  Filled 2022-03-28: qty 20

## 2022-03-28 MED ORDER — LABETALOL HCL 5 MG/ML IV SOLN
INTRAVENOUS | Status: AC
  Filled 2022-03-28: qty 4

## 2022-03-28 MED ORDER — 0.9 % SODIUM CHLORIDE (POUR BTL) OPTIME
TOPICAL | Status: DC | PRN
  Administered 2022-03-28: 1000 mL

## 2022-03-28 MED ORDER — SODIUM CHLORIDE 0.9 % IR SOLN
Status: DC | PRN
  Administered 2022-03-28: 3000 mL

## 2022-03-28 SURGICAL SUPPLY — 29 items
BAG COUNTER SPONGE SURGICOUNT (BAG) ×2 IMPLANT
BAG SPNG CNTER NS LX DISP (BAG) ×1
BNDG GAUZE ELAST 4 BULKY (GAUZE/BANDAGES/DRESSINGS) ×1 IMPLANT
DRAIN PENROSE 0.25X12 (DRAIN) ×2 IMPLANT
DRAPE LAPAROTOMY T 102X78X121 (DRAPES) ×2 IMPLANT
DRSG PAD ABDOMINAL 8X10 ST (GAUZE/BANDAGES/DRESSINGS) ×2 IMPLANT
ELECT REM PT RETURN 9FT ADLT (ELECTROSURGICAL) ×2
ELECTRODE REM PT RTRN 9FT ADLT (ELECTROSURGICAL) ×1 IMPLANT
GAUZE PAD ABD 8X10 STRL (GAUZE/BANDAGES/DRESSINGS) ×2 IMPLANT
GAUZE SPONGE 4X4 12PLY STRL (GAUZE/BANDAGES/DRESSINGS) ×1 IMPLANT
GLOVE SURG ORTHO 8.0 STRL STRW (GLOVE) ×2 IMPLANT
KIT BASIN OR (CUSTOM PROCEDURE TRAY) ×2 IMPLANT
KIT TURNOVER KIT B (KITS) ×2 IMPLANT
NS IRRIG 1000ML POUR BTL (IV SOLUTION) ×2 IMPLANT
PACK GENERAL/GYN (CUSTOM PROCEDURE TRAY) ×2 IMPLANT
PAD ARMBOARD 7.5X6 YLW CONV (MISCELLANEOUS) ×5 IMPLANT
PULSAVAC PLUS IRRIG FAN TIP (DISPOSABLE) ×2
SOL PREP POV-IOD 4OZ 10% (MISCELLANEOUS) ×3 IMPLANT
SPONGE T-LAP 18X18 ~~LOC~~+RFID (SPONGE) ×1 IMPLANT
SUT CHROMIC 3 0 SH 27 (SUTURE) ×1 IMPLANT
SUT ETHILON 2 0 FS 18 (SUTURE) ×1 IMPLANT
SUT PROLENE 4 0 PS 2 18 (SUTURE) ×1 IMPLANT
SUT VIC AB 2-0 FS1 27 (SUTURE) ×2 IMPLANT
SUT VIC AB 3-0 SH 27 (SUTURE) ×2
SUT VIC AB 3-0 SH 27XBRD (SUTURE) IMPLANT
SUT VICRYL 4-0 PS2 18IN ABS (SUTURE) ×1 IMPLANT
TIP FAN IRRIG PULSAVAC PLUS (DISPOSABLE) IMPLANT
TOWEL GREEN STERILE (TOWEL DISPOSABLE) ×2 IMPLANT
WATER STERILE IRR 1000ML POUR (IV SOLUTION) ×2 IMPLANT

## 2022-03-28 NOTE — Progress Notes (Signed)
Patient ID: Jeffrey Ayala, male   DOB: 30-May-1961, 61 y.o.   MRN: [id number] ? ?Day of Surgery ?Subjective: ?Patient doing well this morning. Ready for procedure. Tolerated dialysis yesterday.  ? ?Objective: ?Vital signs in last 24 hours: ?Temp:  [98.1 ?F (36.7 ?C)-98.9 ?F (37.2 ?C)] 98.1 ?F (36.7 ?C) (05/06 3474) ?Pulse Rate:  [90-99] 98 (05/06 0725) ?Resp:  [14-25] 22 (05/06 0336) ?BP: (121-149)/(69-88) 126/87 (05/06 0725) ?SpO2:  [96 %-98 %] 98 % (05/06 0725) ?Weight:  [113.8 kg-115.7 kg] 113.8 kg (05/05 1130) ? ?Intake/Output from previous day: ?05/05 0701 - 05/06 0700 ?In: 303 [IV QVZDGLOVF:643] ?Out: 2000  ?Intake/Output this shift: ?No intake/output data recorded. ? ?Physical Exam:  ?General: Alert and oriented ?GU: Deferred to OR  ? ?Lab Results: ?Recent Labs  ?  03/26/22 ?0151 03/27/22 ?3295 03/28/22 ?0320  ?HGB 7.3* 7.3* 7.7*  ?HCT 23.4* 22.7* 24.5*  ? ? ?  Latest Ref Rng & Units 03/28/2022  ?  3:20 AM 03/27/2022  ?  2:17 AM 03/26/2022  ?  1:51 AM  ?CBC  ?WBC 4.0 - 10.5 K/uL 6.1   5.9   5.0    ?Hemoglobin 13.0 - 17.0 g/dL 7.7   7.3   7.3    ?Hematocrit 39.0 - 52.0 % 24.5   22.7   23.4    ?Platelets 150 - 400 K/uL 255   236   239    ? ? ? ?BMET ?Recent Labs  ?  03/27/22 ?1884 03/28/22 ?0320  ?NA 140 139  ?K 3.8 3.7  ?CL 104 103  ?CO2 26 26  ?GLUCOSE 103* 105*  ?BUN 50* 28*  ?CREATININE 6.46* 4.67*  ?CALCIUM 8.3* 8.4*  ? ? ? ?Studies/Results: ?No results found. ? ?Assessment/Plan: ?1) Scrotal infection:  Proceed with OR today for repeat scrotal debridement. Discussed unlikely but possibility of orchiectomy. Discussed the potential benefits and risks of the procedure, side effects of the proposed treatment, the likelihood of the patient achieving the goals of the procedure, and any potential problems that might occur during the procedure or recuperation.  ? ? ? LOS: 34 days  ? ?Valetta Fuller Aneesha Holloran ?03/28/2022, 7:48 AM ? ?  ?

## 2022-03-28 NOTE — Progress Notes (Signed)
SLP Cancellation Note ? ?Patient Details ?Name: Jeffrey Ayala ?MRN: [id number] ?DOB: [birth year] ? ? ?Cancelled treatment:   Pt remains in PACU after today's procedure. Will continue efforts. ? ?Bard Haupert L. Elige Shouse, MA CCC/SLP ?Acute Rehabilitation Services ?Office number [phone] ?Pager [phone] ?      ?                                              ?                                             ? ? ? ? ?Jeffrey Ayala ?03/28/2022, 1:17 PM ? ? ?

## 2022-03-28 NOTE — Brief Op Note (Signed)
03/28/2022  11:14 AM  PATIENT:  Jeffrey Ayala  61 y.o. male  PRE-OPERATIVE DIAGNOSIS:  scrotal infection  POST-OPERATIVE DIAGNOSIS:  scrotal infection  PROCEDURE:  Procedure(s): IRRIGATION AND DEBRIDEMENT SCROTUM (N/A)  SURGEON:  Surgeon(s) and Role:    * Ardis Hughs, MD - Primary  RESIDENT: Aldine Contes MD - PGY4   ANESTHESIA:   general  EBL:  25 mL   BLOOD ADMINISTERED:none  DRAINS: Penrose drain in the bilateral hemiscrotum    SPECIMEN:  No Specimen  COUNTS:  YES  FINDINGS: Debridement of bilateral testicles and cord down to healthy tissue. Scrotum loosely closed. Bilateral penrose drains placed. See full op note for further details.   PLAN OF CARE: Return to floor for ongoing care

## 2022-03-28 NOTE — Anesthesia Postprocedure Evaluation (Signed)
Anesthesia Post Note ? ?Patient: Jeffrey Ayala ? ?Procedure(s) Performed: IRRIGATION AND DEBRIDEMENT SCROTUM ? ?  ? ?Patient location during evaluation: PACU ?Anesthesia Type: General ?Level of consciousness: awake and alert ?Pain management: pain level controlled ?Vital Signs Assessment: post-procedure vital signs reviewed and stable ?Respiratory status: spontaneous breathing, nonlabored ventilation, respiratory function stable and patient connected to nasal cannula oxygen ?Cardiovascular status: blood pressure returned to baseline and stable ?Postop Assessment: no apparent nausea or vomiting ?Anesthetic complications: no ? ? ?No notable events documented. ? ?Last Vitals:  ?Vitals:  ? 03/28/22 1334 03/28/22 1345  ?BP: 135/80 117/74  ?Pulse: (!) 116 (!) 106  ?Resp: (!) 30 18  ?Temp: 37.2 ?C   ?SpO2: 100% 93%  ?  ?Last Pain:  ?Vitals:  ? 03/28/22 1334  ?TempSrc:   ?PainSc: 3   ? ? ?  ?  ?  ?  ?  ?  ? ?[name] ? ? ? ? ?

## 2022-03-28 NOTE — Progress Notes (Signed)
?PROGRESS NOTE ? ? ? ?MANBIR LOSCHIAVO  WJX:[id number] DOB: [birth year] [name]: 02/22/2022 ?PCP: Patient, No Pcp Per (Inactive)  ?Chief Complaint  ?Patient presents with  ? Atrial Fibrillation  ? Nausea  ? ? ?Brief Narrative:  ?WEI UEHARA is Nasiir Monts 61 y.o. male with PMH significant for arthritis, bilateral knee replacement, gastric ulcer, kidney stones, eosinophilic esophagitis, migraines not on any regular meds.   ?Patient presented to the ED on 4/2 with complaint of nausea, vomiting, diarrhea and confusion for 24 hours.   ?EMS noted him to be in Tameisha Covell-fib with Shaka Cardin heart rate of 120-160 bpm. ?In the ED, he had Paizlie Klaus fever of 102.9. ?CT abdomen pelvis showed swollen scrotum concerning for infection, colon filled with diarrhea.  ?He was hypotensive requiring multiple vasopressors in the ICU.   ?4/3, patient required intubation and also had Genice Kimberlin brief cardiac arrest postintubation.   ?4/5, in an effort to correct vasoplegia, patient was also started on [name] ?Because of septic shock, patient's renal function worsened. ?4/6, patient was started on CRRT ?4/11, patient was extubated. ?4/14, patient was transferred out to [facility] service. ?4/23, underwent left transmetatarsal amputation and right BKA by [name] ?4/24, scrotum debridement and subsequent ICU readmission ?4/27, transfer to [facility] service ?4/29, scrotal wound culture with E. Faecalis and MDR Pseudomonas aeruginosa; ID consulted and patient transitioned to Ampicillin, Avycaz and Flagyl ?5/1, increased lucidity. ID transitioned antibiotics to Augmentin and Zerbaxa ?   ? ? ?Assessment & Plan: ?  ?Principal Problem: ?  Septic shock ([name]) ?Active Problems: ?  Cardiac arrest [facility]) ?  Scrotal infection ?  Gangrene of right foot ([name]) ?  Gangrene of left foot ([name]) ?  AKI (acute kidney injury) ([name]) ?  Atrial fibrillation with RVR ([name]) ?  Altered mental status ?  Anemia ?  Diarrhea ?  Shock liver ?  Acute respiratory failure with hypoxia ([name]) ?  Gout ?  Acute pain ?   Demand ischemia ([name]) ?  Thrombocytopenia ([name]) ?  Malnutrition of moderate degree ?  Pressure injury of skin ?  Encounter for central line placement ?  Severe protein-calorie malnutrition ([name]) ?  Hypotension ?  Respiratory insufficiency ?  Cellulitis ?  Obesity, Class III, BMI 40-49.9 (morbid obesity) ([name]) ? ? ?Assessment and Plan: ?* Septic shock ([name]) ?Due to scrotal infection ?Required ICU admission with vasopressor support ? ?Cardiac arrest [facility]) ?"brief" cardiac arrest post intubation on 4/3 ?Echo with normal EF, no RWMA ?No additional plans for ischemic testing at this time per cards ? ? ?Scrotal infection ?Dry gangrene of anterior scrotum, s/p debridement on 4/24 ?S/p irrigation and debridement of scrotum with wound closure on 5/6 ?MDR pseudomonas, enterococcus faecalis, bacteroides ovalis, beta lactamase positive ?Continue zerbaxa and augmentin per ID ?Pain management ? ? ? ? ?Gangrene of left foot ([name]) ?S/p L TMA and right BKA with [name] on 4/23 ?Per ID, no abx indicated at this time ? ?Gangrene of right foot ([name]) ?S/p L TMA and right BKA with [name] on 4/23 ?Per ID, no abx indicated at this time ? ?AKI (acute kidney injury) ([name]) ?Thought 2/2 ATN from septic shock ?Anuric and dialysis dependent since 02/26/2022 ?Per renal ? ?Atrial fibrillation with RVR ([name]) ?New diagnosis. CHA2DS2-VASc Score is 1 (PAD).Transthoracic Echocardiogram with preserved LVEF. Mostly rate controlled but did have some associated tachycardia while agitated. Initially was managed on heparin IV which was discontinued secondary to anemia. Cardiology consulted. Patient started on metoprolol 37.5 mg TID and aspirin  81 mg.  ?-Continue metoprolol 37.5 mg TID and aspirin 81 mg daily ?- needs outpatient cardiology follow up ? ?Altered mental status ?Unsure of specific etiology. Possibly secondary to critical illness but also complicated by uremia and concern for possible hypoxemic injury while in septic shock. CT head unremarkable  for acute process. Patient started on Seroquel for hopeful management with not much improvement. Patient with waxing and waning symptoms. Normal vitamin B1 and B12. Patient found to have likely smoldering infection that might have contributed to persistent mental status issues. ?-Delirium precautions ?-Continue Seroquel BID; will discontinue AM seroquel for now - follow and will deescalate/d/c as able ?-Haldol PRN ? ?Anemia ?Stable around 7.2 ?Will follow  ?Follow anemia labs - c/w aocd ?S/p 4 units pRBC ? ?Diarrhea ?Negative c diff on 4/2 ?Negative gi path panel on 4/17 ?Has rectal tube in place ? ?Shock liver ?improved ? ?Acute respiratory failure with hypoxia ([name]) ?Patient intubated from 4/3 - 4/10, 4/23 - 4/23, 4/24 - 4/25. Resolved. ? ?Gout ?Now on allopurinol ?Colchicine has now been d/c'd ? ?Demand ischemia ([name]) ?In setting of septic shock and cardiac arrest ?Per cards, due to demand ischemia ? ?Thrombocytopenia ([name]) ?S/p 1 unit platelets ? ?Malnutrition of moderate degree ?In setting of altered mentation. Dietitian consulted. Small bore NG tube placed on 4/17 and tube feeds initiated. Unfortunately, NG tube mistakenly removed on 4/28. SLP reevaluated on 4/29 with recommendation for dysphagia 3 diet. Currently undergoing calorie count - met caloric needs.  ?-SLP recommendations (4/29): ?  Diet recommendations: Dysphagia 3 (mechanical soft);Thin liquid ?Liquids provided via: Cup;Straw ?Medication Administration: Crushed with puree ?Supervision: Staff to assist with self feeding;Full supervision/cueing for compensatory strategies;Trained caregiver to feed patient ?Compensations: Minimize environmental distractions;Slow rate;Small sips/bites ?Postural Changes and/or Swallow Maneuvers: Seated upright 90 degrees  ? ? ? ?Pressure injury of skin ?Pressure Injury 02/22/22 Coccyx Medial Stage 2 -  Partial thickness loss of dermis presenting as Lailynn Southgate shallow open injury with Lloyd Cullinan red, pink wound bed without slough. pink,  painful (Active)  ?02/22/22 2330  ?Location: Coccyx  ?Location Orientation: Medial  ?Staging: Stage 2 -  Partial thickness loss of dermis presenting as Mame Twombly shallow open injury with Dmiya Malphrus red, pink wound bed without slough.  ?Wound Description (Comments): pink, painful  ?Present on Admission: Yes  ? ? ? ? ? ?DVT prophylaxis: heparin ?Code Status: full ?Family Communication: fam at bedside ?Disposition:  ? ?Status is: Inpatient ?Remains inpatient appropriate because: need for abx, urology intervention ?  ?Consultants:  ?Urology ?ID ?PCCM ?Cards ?Nephrology ?Vascular surgery ? ?Procedures:  ?LEFT TRANSMETATARSAL AMPUTATION, RIGHT BELOW KNEE AMPUTATION (4/23) ?Debridement of scrotum (4/24) ?Echo ?IMPRESSIONS  ? ? ? 1. Left ventricular ejection fraction, by estimation, is 60 to 65%. The  ?left ventricle has normal function. The left ventricle has no regional  ?wall motion abnormalities. There is mild left ventricular hypertrophy.  ?Left ventricular diastolic function  ?could not be evaluated.  ? 2. Right ventricular systolic function is normal. The right ventricular  ?size is normal.  ? 3. The mitral valve is normal in structure. No evidence of mitral valve  ?regurgitation. No evidence of mitral stenosis.  ? 4. The aortic valve is tricuspid. Aortic valve regurgitation is not  ?visualized. No aortic stenosis is present.  ? 5. The inferior vena cava is dilated in size with <50% respiratory  ?variability, suggesting right atrial pressure of 15 mmHg.  ? ?Comparison(s): No prior Echocardiogram.  ?  ?ABI  ?Summary:  ?Right: Resting right ankle-brachial index  indicates severe right lower  ?extremity arterial disease.  ? ?Unable to obtain TBI due to low amplitude waveforms.  ?Left: Resting left ankle-brachial index indicates noncompressible left  ?lower extremity arteries.  ?  ?ABI ?Summary:  ?Right: Resting right ankle-brachial index indicates noncompressible right  ?lower extremity arteries.  ? ?Left: Resting left ankle-brachial  index indicates noncompressible left  ?lower extremity arteries. The left toe-brachial index is abnormal.  ? ?Antimicrobials:  ?Anti-infectives (From admission, onward)  ? ? Start     Dose/Rate Route Frequency

## 2022-03-28 NOTE — Progress Notes (Signed)
PT Cancellation Note ? ?Patient Details ?Name: SAVIR BLANKE ?MRN: [id number] ?DOB: 23-Jun-1961 ? ? ?Cancelled Treatment:    Reason Eval/Treat Not Completed: (P) Patient at procedure or test/unavailable Pt is off floor for I&D. PT will follow back after procedure as able. ? ?Bedie Dominey B. Migdalia Dk PT, DPT ?Acute Rehabilitation Services ?Please use secure chat or  ?Call Office [phone] ? ? ? ?Bailey Mech Fleet ?03/28/2022, 7:40 AM ? ? ?

## 2022-03-28 NOTE — Transfer of Care (Addendum)
Immediate Anesthesia Transfer of Care Note ? ?Patient: Jeffrey Ayala ? ?Procedure(s) Performed: IRRIGATION AND DEBRIDEMENT SCROTUM ? ?Patient Location: PACU ? ?Anesthesia Type:General ? ?Level of Consciousness: awake and alert  ? ?Airway & Oxygen Therapy: Patient Spontanous Breathing ? ?Post-op Assessment: Report given to RN and Post -op Vital signs reviewed and stable ? ?Post vital signs: Reviewed and stable ? ?Last Vitals:  ?Vitals Value Taken Time  ?BP 146/92 03/28/22 1119  ?Temp    ?Pulse 128 03/28/22 1123  ?Resp 21 03/28/22 1121  ?SpO2 97 % 03/28/22 1123  ?Vitals shown include unvalidated device data. ? ?Last Pain:  ?Vitals:  ? 03/28/22 0725  ?TempSrc:   ?PainSc: 0-No pain  ?  Dr. Gloris Manchester aware of HR.  ? ?Patients Stated Pain Goal: 0 (03/18/22 1600) ? ?Complications: No notable events documented. ?

## 2022-03-28 NOTE — Interval H&P Note (Signed)
History and Physical Interval Note: ? ?03/28/2022 ?7:59 AM ? ?Jeffrey Ayala  has presented today for surgery, with the diagnosis of scrotal infection.  The various methods of treatment have been discussed with the patient and family. After consideration of risks, benefits and other options for treatment, the patient has consented to  Procedure(s): ?IRRIGATION AND DEBRIDEMENT SCROTUM (N/A) ?POSSIBLE ORCHIECTOMY (N/A) as a surgical intervention.  The patient's history has been reviewed, patient examined, no change in status, stable for surgery.  I have reviewed the patient's chart and labs.  Questions were answered to the patient's satisfaction.   ? ? ?Ardis Hughs ? ? ?

## 2022-03-28 NOTE — Op Note (Addendum)
Preoperative diagnosis:  ?1. Scrotal infection  ? ?Postoperative diagnosis: ?1. Scrotal infection  ? ?Procedure(s): ?1. Irrigation and debridement of scrotum ?2. Wound closure  ? ?Surgeon: Dr. Louis Meckel MD ? ?Resident: Aldine Contes MD - PGY4 ? ?Anesthesia: General  ? ?Complications: None  ? ?EBL: 20cc ? ?Specimens: ?None ? ?Intraoperative findings:  ?-Thick fibrinous rind covering wound bed and bilateral testicles  ?-Once rind was removed, underlying tissue appeared healthy and viable  ?- Scrotum closed loosely after debridement  ? ? ? ? ? ? ? ? ?Indication: Please see prior notes for full details. In brief, this is a 61 y/o medical complicated man who developed a severe scrotal infection requiring debridement on 4/24. He has undergone local wound care since. He returns to the operating room for irrigation and debridement of the wound. Risks and benefits extensively discussion with patient who wishes to proceed.  ? ?Description of procedure: ? ?The patient was taken to the operating room and a general anesthetic was administered. The patient was given preoperative antibiotics, placed in the frog leg position with padding placed under their knees  Care was taken to pad all potential pressure points, and they were prepped and draped in the usual sterile fashion. Next a preoperative timeout was performed. ? ?On exam there was a thick fibrinous rind overlying the prior wound bed and the bilateral testicles. We began by carefully peeling off this rind with a combination of sharp and blunt dissection. There was a distinct plane between devitalized tissue and healthy tissue below. Once this rind was removed, we were able to further dissect out each spermatic cord. Bilateral cords and testicles appeared viable. We then used the back of the scalpel to remove any additional unhealthy tissue within the scrotum being careful not to violate the testicles, epididymis, or cord. We Identified a layer which appeared to be  dartos and septum and freed this up bilaterally. At this point we felt that we were down to healthy tissue throughout as evidenced by appropriate bleeding. We then used the pulse irrigator for several minutes to throughout washout the wound. Hemostasis was achieved with bovie cautery.  ? ?At this point given the absence of any new pockets of infection, poor granulation, difficulty with dressing changes and how healthy the tissue now appeared, we elected to loosely close the scrotum. Each testicle was placed into its respective hemiscrotum and dartos was used to reapproximate the scrotal septum with 3-0 vicryl in an interrupted fashion. 1/4 inch penrose drains were placed into each hemiscrotum and secured to the skin with nylon suture. We then closed scrotal skin in the midline using interrupted horizontal mattress stiches of 2-0 vicryl. These were intentionally placed far apart to allow the scrotum to continue to drain. Fluffs and scrotal support were placed.  ? ?Plan:  ?-Change fluffs and gauze as needed. Anticipate that there will be bloody drainage on gauze ? ?Dr. Louis Meckel was present and scrubbed for the entire procedure.  ? ?  ?

## 2022-03-28 NOTE — H&P (View-Only) (Signed)
Patient ID: AGAM DAVENPORT, male   DOB: [birth year], 61 y.o.   MRN: [id number] ? ?Day of Surgery ?Subjective: ?Patient doing well this morning. Ready for procedure. Tolerated dialysis yesterday.  ? ?Objective: ?Vital signs in last 24 hours: ?Temp:  [98.1 ?F (36.7 ?C)-98.9 ?F (37.2 ?C)] 98.1 ?F (36.7 ?C) (05/06 5038) ?Pulse Rate:  [90-99] 98 (05/06 0725) ?Resp:  [14-25] 22 (05/06 0336) ?BP: (121-149)/(69-88) 126/87 (05/06 0725) ?SpO2:  [96 %-98 %] 98 % (05/06 0725) ?Weight:  [113.8 kg-115.7 kg] 113.8 kg (05/05 1130) ? ?Intake/Output from previous day: ?05/05 0701 - 05/06 0700 ?In: 303 [IV UEKCMKLKJ:179] ?Out: 2000  ?Intake/Output this shift: ?No intake/output data recorded. ? ?Physical Exam:  ?General: Alert and oriented ?GU: Deferred to OR  ? ?Lab Results: ?Recent Labs  ?  03/26/22 ?0151 03/27/22 ?1505 03/28/22 ?0320  ?HGB 7.3* 7.3* 7.7*  ?HCT 23.4* 22.7* 24.5*  ? ? ?  Latest Ref Rng & Units 03/28/2022  ?  3:20 AM 03/27/2022  ?  2:17 AM 03/26/2022  ?  1:51 AM  ?CBC  ?WBC 4.0 - 10.5 K/uL 6.1   5.9   5.0    ?Hemoglobin 13.0 - 17.0 g/dL 7.7   7.3   7.3    ?Hematocrit 39.0 - 52.0 % 24.5   22.7   23.4    ?Platelets 150 - 400 K/uL 255   236   239    ? ? ? ?BMET ?Recent Labs  ?  03/27/22 ?6979 03/28/22 ?0320  ?NA 140 139  ?K 3.8 3.7  ?CL 104 103  ?CO2 26 26  ?GLUCOSE 103* 105*  ?BUN 50* 28*  ?CREATININE 6.46* 4.67*  ?CALCIUM 8.3* 8.4*  ? ? ? ?Studies/Results: ?No results found. ? ?Assessment/Plan: ?1) Scrotal infection:  Proceed with OR today for repeat scrotal debridement. Discussed unlikely but possibility of orchiectomy. Discussed the potential benefits and risks of the procedure, side effects of the proposed treatment, the likelihood of the patient achieving the goals of the procedure, and any potential problems that might occur during the procedure or recuperation.  ? ? ? LOS: 34 days  ? ?Valetta Fuller Ioane Bhola ?03/28/2022, 7:48 AM ? ?  ?

## 2022-03-28 NOTE — Anesthesia Procedure Notes (Signed)
Procedure Name: LMA Insertion ?Date/Time: 03/28/2022 8:58 AM ?Performed by: Dorthea Cove, CRNA ?Pre-anesthesia Checklist: Patient identified, Emergency Drugs available, Suction available and Patient being monitored ?Patient Re-evaluated:Patient Re-evaluated prior to induction ?Oxygen Delivery Method: Circle System Utilized ?Preoxygenation: Pre-oxygenation with 100% oxygen ?Induction Type: IV induction ?Ventilation: Mask ventilation without difficulty ?LMA: LMA inserted ?LMA Size: 4.0 ?Number of attempts: 1 ?Airway Equipment and Method: Bite block ?Placement Confirmation: positive ETCO2 ?Tube secured with: Tape ?Dental Injury: Teeth and Oropharynx as per pre-operative assessment  ? ? ? ? ?

## 2022-03-29 ENCOUNTER — Encounter (HOSPITAL_COMMUNITY): Payer: Self-pay | Admitting: Urology

## 2022-03-29 DIAGNOSIS — R6521 Severe sepsis with septic shock: Secondary | ICD-10-CM | POA: Diagnosis not present

## 2022-03-29 DIAGNOSIS — A419 Sepsis, unspecified organism: Secondary | ICD-10-CM | POA: Diagnosis not present

## 2022-03-29 LAB — COMPREHENSIVE METABOLIC PANEL
ALT: 13 U/L (ref 0–44)
AST: 24 U/L (ref 15–41)
Albumin: 2.4 g/dL — ABNORMAL LOW (ref 3.5–5.0)
Alkaline Phosphatase: 79 U/L (ref 38–126)
Anion gap: 10 (ref 5–15)
BUN: 41 mg/dL — ABNORMAL HIGH (ref 8–23)
CO2: 27 mmol/L (ref 22–32)
Calcium: 8.1 mg/dL — ABNORMAL LOW (ref 8.9–10.3)
Chloride: 103 mmol/L (ref 98–111)
Creatinine, Ser: 6.35 mg/dL — ABNORMAL HIGH (ref 0.61–1.24)
GFR, Estimated: 9 mL/min — ABNORMAL LOW (ref >60)
Glucose, Bld: 132 mg/dL — ABNORMAL HIGH (ref 70–99)
Potassium: 4 mmol/L (ref 3.5–5.1)
Sodium: 140 mmol/L (ref 135–145)
Total Bilirubin: 1.4 mg/dL — ABNORMAL HIGH (ref 0.3–1.2)
Total Protein: 6.5 g/dL (ref 6.5–8.1)

## 2022-03-29 LAB — RENAL FUNCTION PANEL
Albumin: 2.4 g/dL — ABNORMAL LOW (ref 3.5–5.0)
Anion gap: 12 (ref 5–15)
BUN: 42 mg/dL — ABNORMAL HIGH (ref 8–23)
CO2: 26 mmol/L (ref 22–32)
Calcium: 8.1 mg/dL — ABNORMAL LOW (ref 8.9–10.3)
Chloride: 102 mmol/L (ref 98–111)
Creatinine, Ser: 6.32 mg/dL — ABNORMAL HIGH (ref 0.61–1.24)
GFR, Estimated: 9 mL/min — ABNORMAL LOW (ref >60)
Glucose, Bld: 129 mg/dL — ABNORMAL HIGH (ref 70–99)
Phosphorus: 7.2 mg/dL — ABNORMAL HIGH (ref 2.5–4.6)
Potassium: 4 mmol/L (ref 3.5–5.1)
Sodium: 140 mmol/L (ref 135–145)

## 2022-03-29 LAB — CBC WITH DIFFERENTIAL/PLATELET
Abs Immature Granulocytes: 0.04 10*3/uL (ref 0.00–0.07)
Abs Immature Granulocytes: 0.04 10*3/uL (ref 0.00–0.07)
Basophils Absolute: 0 10*3/uL (ref 0.0–0.1)
Basophils Absolute: 0 10*3/uL (ref 0.0–0.1)
Basophils Relative: 0 %
Basophils Relative: 0 %
Eosinophils Absolute: 0 10*3/uL (ref 0.0–0.5)
Eosinophils Absolute: 0 10*3/uL (ref 0.0–0.5)
Eosinophils Relative: 0 %
Eosinophils Relative: 0 %
HCT: 16.4 % — ABNORMAL LOW (ref 39.0–52.0)
HCT: 19.2 % — ABNORMAL LOW (ref 39.0–52.0)
Hemoglobin: 5.2 g/dL — CL (ref 13.0–17.0)
Hemoglobin: 6.1 g/dL — CL (ref 13.0–17.0)
Immature Granulocytes: 1 %
Immature Granulocytes: 1 %
Lymphocytes Relative: 17 %
Lymphocytes Relative: 17 %
Lymphs Abs: 1.3 10*3/uL (ref 0.7–4.0)
Lymphs Abs: 1.1 10*3/uL (ref 0.7–4.0)
MCH: 29.7 pg (ref 26.0–34.0)
MCH: 29.2 pg (ref 26.0–34.0)
MCHC: 31.7 g/dL (ref 30.0–36.0)
MCHC: 31.8 g/dL (ref 30.0–36.0)
MCV: 93.7 fL (ref 80.0–100.0)
MCV: 91.9 fL (ref 80.0–100.0)
Monocytes Absolute: 1.1 10*3/uL — ABNORMAL HIGH (ref 0.1–1.0)
Monocytes Absolute: 0.8 10*3/uL (ref 0.1–1.0)
Monocytes Relative: 14 %
Monocytes Relative: 11 %
Neutro Abs: 5.2 10*3/uL (ref 1.7–7.7)
Neutro Abs: 4.7 10*3/uL (ref 1.7–7.7)
Neutrophils Relative %: 68 %
Neutrophils Relative %: 71 %
Platelets: 229 10*3/uL (ref 150–400)
Platelets: 187 10*3/uL (ref 150–400)
RBC: 1.75 MIL/uL — ABNORMAL LOW (ref 4.22–5.81)
RBC: 2.09 MIL/uL — ABNORMAL LOW (ref 4.22–5.81)
RDW: 15.7 % — ABNORMAL HIGH (ref 11.5–15.5)
RDW: 16.2 % — ABNORMAL HIGH (ref 11.5–15.5)
WBC: 7.6 10*3/uL (ref 4.0–10.5)
WBC: 6.7 10*3/uL (ref 4.0–10.5)
nRBC: 0 % (ref 0.0–0.2)
nRBC: 0.3 % — ABNORMAL HIGH (ref 0.0–0.2)

## 2022-03-29 LAB — PROTIME-INR
INR: 1.2 (ref 0.8–1.2)
Prothrombin Time: 15 seconds (ref 11.4–15.2)

## 2022-03-29 LAB — HEPATIC FUNCTION PANEL
ALT: 12 U/L (ref 0–44)
AST: 21 U/L (ref 15–41)
Albumin: 2.4 g/dL — ABNORMAL LOW (ref 3.5–5.0)
Alkaline Phosphatase: 71 U/L (ref 38–126)
Bilirubin, Direct: 0.4 mg/dL — ABNORMAL HIGH (ref 0.0–0.2)
Indirect Bilirubin: 1.2 mg/dL — ABNORMAL HIGH (ref 0.3–0.9)
Total Bilirubin: 1.6 mg/dL — ABNORMAL HIGH (ref 0.3–1.2)
Total Protein: 6.5 g/dL (ref 6.5–8.1)

## 2022-03-29 LAB — PHOSPHORUS: Phosphorus: 7.2 mg/dL — ABNORMAL HIGH (ref 2.5–4.6)

## 2022-03-29 LAB — GLUCOSE, CAPILLARY
Glucose-Capillary: 125 mg/dL — ABNORMAL HIGH (ref 70–99)
Glucose-Capillary: 148 mg/dL — ABNORMAL HIGH (ref 70–99)
Glucose-Capillary: 129 mg/dL — ABNORMAL HIGH (ref 70–99)
Glucose-Capillary: 112 mg/dL — ABNORMAL HIGH (ref 70–99)

## 2022-03-29 LAB — MAGNESIUM: Magnesium: 2.1 mg/dL (ref 1.7–2.4)

## 2022-03-29 LAB — HEMOGLOBIN AND HEMATOCRIT, BLOOD
HCT: 19.2 % — ABNORMAL LOW (ref 39.0–52.0)
Hemoglobin: 6.2 g/dL — CL (ref 13.0–17.0)

## 2022-03-29 LAB — APTT: aPTT: 37 seconds — ABNORMAL HIGH (ref 24–36)

## 2022-03-29 LAB — PREPARE RBC (CROSSMATCH)

## 2022-03-29 MED ORDER — LANTHANUM CARBONATE 500 MG PO CHEW
1000.0000 mg | CHEWABLE_TABLET | Freq: Three times a day (TID) | ORAL | Status: DC
Start: 2022-03-29 — End: 2022-04-03
  Administered 2022-03-30 – 2022-04-03 (×10): 1000 mg via ORAL
  Filled 2022-03-29 (×14): qty 2

## 2022-03-29 MED ORDER — SODIUM CHLORIDE 0.9% IV SOLUTION: Freq: Once | INTRAVENOUS | Status: AC

## 2022-03-29 MED ORDER — SODIUM CHLORIDE 0.9% IV SOLUTION: Freq: Once | INTRAVENOUS | Status: DC

## 2022-03-29 MED ORDER — SODIUM CHLORIDE 0.9 % IV SOLN
150.0000 mg | Freq: Three times a day (TID) | INTRAVENOUS | Status: DC
  Administered 2022-03-29 – 2022-04-03 (×14): 150 mg via INTRAVENOUS
  Filled 2022-03-29: qty 1.14
  Filled 2022-03-29: qty 1.1
  Filled 2022-03-29 (×2): qty 1.14
  Filled 2022-03-29 (×2): qty 1.1
  Filled 2022-03-29 (×2): qty 1.14
  Filled 2022-03-29: qty 1.1
  Filled 2022-03-29 (×3): qty 1.14
  Filled 2022-03-29: qty 1.1
  Filled 2022-03-29: qty 1.14
  Filled 2022-03-29: qty 1.1
  Filled 2022-03-29 (×2): qty 1.14
  Filled 2022-03-29: qty 1.1
  Filled 2022-03-29: qty 1.14

## 2022-03-29 NOTE — Progress Notes (Signed)
Notified on-call physician of critical hgb of 5.2. Patient is alert oriented with no distress. Awaiting orders and monitoring patient. ?

## 2022-03-29 NOTE — Progress Notes (Signed)
Second notification of patient with critical hgb 5.2. Continuing to monitor. Patient asymptomatic. ?

## 2022-03-29 NOTE — Progress Notes (Signed)
Speech Language Pathology Treatment: Dysphagia  ?Patient Details ?Name: YUKI PURVES ?MRN: [id number] ?DOB: [birth year] ?Today's Date: 03/29/2022 ?Time: 1194-1740 ?SLP Time Calculation (min) (ACUTE ONLY): 10 min ? ?Assessment / Plan / Recommendation ?Clinical Impression ? Pt is alert, f/c, flat affect but interactive.  His swallowing appears to be quite functional with thorough mastication, the appearance of a brisk swallow response, and no s/s of aspiration. His swallow has been evaluated several times since admission, and generally the barrier to oral intake has been related to mentation, not a biomechanical dysphagia. Today he continues to protect airway well and is attentive to POs. There are no further acute care SLP needs. Continue current renal diet as ordered; give meds whole in water. SLP service will sign off. ?  ?HPI HPI: 61 y.o. male presented to the [facility] ED with a chief complaint of nausea, vomiting, diarrhea. Dx septic shock (sources could be scrotal cellulitis versus right lower lobe aspiration pneumonia), afib, acute hypoxemic resp failure requiring intubation 4/3-4/10, AKI from septic shock, metabolic encephalopathy. BSE 4/10 full liquids (pt choice), BSE 4/17 recommended regular texture, thin liquids, On 4/23 pt underwent amputation of left transmetatarsal then 4/24 underwent debridement of scrotum.Repeat swallow eval 4/26 with findings of dysphagia related to mentation.  ?  ?   ?SLP Plan ? All goals met ? ?  ?  ?Recommendations for follow up therapy are one component of a multi-disciplinary discharge planning process, led by the attending physician.  Recommendations may be updated based on patient status, additional functional criteria and insurance authorization. ?  ? ?Recommendations  ?Diet recommendations: Regular;Thin liquid ?Liquids provided via: Cup;Straw ?Medication Administration: Whole meds with liquid ?Supervision: Patient able to self feed;Staff to assist with self feeding ?Postural  Changes and/or Swallow Maneuvers: Seated upright 90 degrees  ?   ?    ?   ? ? ? ? Oral Care Recommendations: Oral care BID ?Follow Up Recommendations: No SLP follow up ?SLP Visit Diagnosis: Dysphagia, unspecified (R13.10) ?Plan: All goals met ? ? ? ? ?  ?  ?Darcus Edds L. Lurlean Kernen, MA CCC/SLP ?Acute Rehabilitation Services ?Office number [phone] ?Pager [phone] ? ? ?Juan Quam Laurice ? ?03/29/2022, 2:20 PM ?

## 2022-03-29 NOTE — Progress Notes (Addendum)
Patient ID: Jeffrey Ayala, male   DOB: 1961/09/04, 61 y.o.   MRN: [id number] ? ?S/p initial scrotal debridement 4/24 ?S/p debridement and scrotal closer 5/6 ? ?Subjective: ?Scrotum debrided an loosely closed 5/6 with bilateral penrose drains in place ?Doing well this morning. Some scrotal pain and pressure sensation.  ?Hgb 5.2 today from 7.7; receiving 2 units pRBC ? ?Objective: ?Vital signs in last 24 hours: ?Temp:  [98.1 ?F (36.7 ?C)-99.3 ?F (37.4 ?C)] 98.7 ?F (37.1 ?C) (05/07 1037) ?Pulse Rate:  [89-149] 89 (05/07 1037) ?Resp:  [15-30] 15 (05/07 1037) ?BP: (107-154)/(67-100) 121/70 (05/07 1037) ?SpO2:  [91 %-100 %] 99 % (05/07 1037) ? ?Intake/Output from previous day: ?05/06 0701 - 05/07 0700 ?In: 1009.4 [P.O.:250; I.V.:509.4; IV Piggyback:250] ?Out: 675 [Urine:600; Stool:50; Blood:25] ?Intake/Output this shift: ?Total I/O ?In: 386 [Blood:386] ?Out: -  ? ?Physical Exam:  ?General: Alert and oriented ?GU: Small amount of serosanguinous drainage on gauze today. Scrotum is mildly edematous throughout. Midline incision is clean and dry, unable to express any fluid through incision. There is no fluctuance or induration. No overlying erythema  ? ?Lab Results: ?Recent Labs  ?  03/27/22 ?0217 03/28/22 ?0320 03/29/22 ?0200  ?HGB 7.3* 7.7* 5.2*  ?HCT 22.7* 24.5* 16.4*  ? ? ?  Latest Ref Rng & Units 03/29/2022  ?  2:00 AM 03/28/2022  ?  3:20 AM 03/27/2022  ?  2:17 AM  ?CBC  ?WBC 4.0 - 10.5 K/uL 7.6   6.1   5.9    ?Hemoglobin 13.0 - 17.0 g/dL 5.2   7.7   7.3    ?Hematocrit 39.0 - 52.0 % 16.4   24.5   22.7    ?Platelets 150 - 400 K/uL 229   255   236    ? ? ? ?BMET ?Recent Labs  ?  03/28/22 ?0320 03/29/22 ?0200  ?NA 139 140  140  ?K 3.7 4.0  4.0  ?CL 103 103  102  ?CO2 '26 27  26  '$ ?GLUCOSE 105* 132*  129*  ?BUN 28* 41*  42*  ?CREATININE 4.67* 6.35*  6.32*  ?CALCIUM 8.4* 8.1*  8.1*  ? ? ? ?Studies/Results: ?No results found. ? ?Assessment/Plan: ?1) Scrotal infection:  Now s/p scrotal closure 5/6. Wound today looks  appropriate. We expect some output from bilateral drains and scrotal edema. Pain seems well managed with prns and when patient at rest. Hgb drop likely multifactorial. There does not appear to be any active bleeding from the scrotum. We will continue to follow.  ? ? ?I performed a history and physical examination of the patient and discussed the patients management with the resident.  I reviewed the resident's note and agree with the documented findings and plan of care.  ? ? LOS: 35 days  ? ?Ardis Hughs ?03/29/2022, 10:42 AM ? ?  ?

## 2022-03-29 NOTE — Progress Notes (Signed)
Patient ID: Jeffrey Ayala, male   DOB: November 09, 1961, 61 y.o.   MRN: [id number] ?[facility] ?Progress Note  ? ?Assessment/ Plan:   ?1. Acute kidney Injury: Etiology suspected to be ATN from severe septic shock in the setting of foot wounds.  Oliguric overnight with 600 cc urine output and has been dialysis dependent since 02/26/2022 (initially started with CRRT and transition to IHD).  Hemodialysis ordered for tomorrow and will continue close monitoring of urine output/labs for potential renal recovery. ?2.  Scrotal edema/wound: Status post surgical debridement by urology on 4/24 and yesterday (5/6) underwent irrigation and debridement of scrotum with wound closure and drain placement.  He remains on Augmentin and Zerbaxa for antimicrobial coverage. ?3.  Leg wound status post left TMA and right below-knee amputation on 03/15/2022: Septic shock resolved and remains on antimicrobial coverage. ?4.  Anemia: With acute asymptomatic hemoglobin/hematocrit drop noted overnight with minimal intraoperative blood loss-suspect that he possibly may have had postoperative losses and/or dilution. ?5.  Hyperphosphatemia: Secondary to acute kidney injury and corrected to normal range with ongoing binders/hemodialysis.  Continue renal diet and switch from sevelamer to Fosrenol. ? ?Subjective:   ?Yesterday underwent irrigation and debridement of scrotum with wound closure and drains in place.  ? ?Objective:   ?BP 111/67 (BP Location: Right Arm)   Pulse 100   Temp 98.4 ?F (36.9 ?C) (Oral)   Resp 16   Ht '5\' 10"'$  (1.778 m)   Wt 113.8 kg   SpO2 98%   BMI 36.00 kg/m?  ? ?Intake/Output Summary (Last 24 hours) at 03/29/2022 0928 ?Last data filed at 03/29/2022 0827 ?Gross per 24 hour  ?Intake 1395.42 ml  ?Output 675 ml  ?Net 720.42 ml  ? ?Weight change: -1.867 kg ? ?Physical Exam: ?Gen: Sitting comfortably in bed, son at bedside ?CVS: Pulse regular rhythm, normal rate, S1 and S2 normal ?Resp: Clear to auscultation bilaterally, no  distinct rales or rhonchi ?Abd: Soft, obese, nontender, bowel sounds normal ?Ext: Status post left TMA and right BKA with wound vacs in place ?GU: Scrotal drains in place ? ?Imaging: ?No results found. ? ?Labs: ?BMET ?Recent Labs  ?Lab 03/23/22 ?0254 03/24/22 ?0159 03/25/22 ?0158 03/26/22 ?0151 03/27/22 ?0217 03/28/22 ?0320 03/29/22 ?0200  ?NA 133* 137 138 139 140 139 140  140  ?K 3.8 3.5 3.9 3.6 3.8 3.7 4.0  4.0  ?CL 96* 99 102 103 104 103 103  102  ?CO2 21* '24 22 27 26 26 27  26  '$ ?GLUCOSE 109* 104* 107* 98 103* 105* 132*  129*  ?BUN 96* 47* 66* 34* 50* 28* 41*  42*  ?CREATININE 8.13* 5.24* 7.08* 4.81* 6.46* 4.67* 6.35*  6.32*  ?CALCIUM 8.3* 8.3* 8.4* 8.3* 8.3* 8.4* 8.1*  8.1*  ?PHOS 7.8* 5.4* 7.4* 4.9* 6.3* 4.8* 7.2*  7.2*  ? ?CBC ?Recent Labs  ?Lab 03/26/22 ?0151 03/27/22 ?0217 03/28/22 ?0320 03/29/22 ?0200  ?WBC 5.0 5.9 6.1 7.6  ?NEUTROABS 3.1 3.8 4.4 5.2  ?HGB 7.3* 7.3* 7.7* 5.2*  ?HCT 23.4* 22.7* 24.5* 16.4*  ?MCV 92.9 93.4 92.8 93.7  ?PLT 239 236 255 229  ? ? ?Medications:   ? ? (feeding supplement) PROSource Plus  30 mL Oral TID BM  ? sodium chloride   Intravenous Once  ? acetaminophen  650 mg Oral Q6H  ? allopurinol  100 mg Oral Daily  ? amoxicillin-clavulanate  1 tablet Oral BID  ? vitamin C  1,000 mg Oral Daily  ? aspirin  81 mg Oral Daily  ?  Chlorhexidine Gluconate Cloth  6 each Topical Q0600  ? darbepoetin (ARANESP) injection - DIALYSIS  200 mcg Intravenous Q Mon-HD  ? feeding supplement (NEPRO CARB STEADY)  237 mL Oral BID BM  ? fiber  1 packet Oral BID  ? folic acid  1 mg Oral Daily  ? guaiFENesin  400 mg Per Tube Q8H  ? heparin injection (subcutaneous)  5,000 Units Subcutaneous Q8H  ? leptospermum manuka honey  1 application. Topical Daily  ? melatonin  3 mg Oral QHS  ? metoprolol tartrate  37.5 mg Oral TID  ? multivitamin  1 tablet Oral QHS  ? nutrition supplement (JUVEN)  1 packet Oral BID BM  ? pantoprazole sodium  40 mg Per Tube Daily  ? QUEtiapine  50 mg Oral QHS  ? sevelamer carbonate   2.4 g Oral Q8H  ? sodium chloride flush  3 mL Intravenous Q12H  ? sodium hypochlorite   Irrigation BID  ? zinc sulfate  220 mg Oral Daily  ? ?Elmarie Shiley, MD ?03/29/2022, 9:28 AM  ? ?

## 2022-03-29 NOTE — Progress Notes (Addendum)
HOSPITAL MEDICINE OVERNIGHT EVENT NOTE   ? ?Notified by nursing that patient's hemoglobin after 2 unit packed red blood cell transfusion performed this morning/afternoon only incremented from 5.2 all the way up to 6.2. ? ?Nursing once again reports the patient is exhibiting no evidence of bleeding and is hemodynamically stable. ? ?After review of the labs, it seems that the hemoglobin and hematocrit may have been drawn too quickly after completion of the transfusion.  We will obtain a repeat CBC, this time with differential, peripheral smear, hepatic function panel and coagulation panel. ? ?Vernelle Emerald  MD ?[facility]  ? ? ? ? ? ? ? ? ? ? ?

## 2022-03-29 NOTE — Progress Notes (Signed)
?PROGRESS NOTE ? ? ? ?Jeffrey Ayala  HYQ:[id number] DOB: [birth year] [name]: 02/22/2022 ?PCP: Patient, No Pcp Per (Inactive)  ?Chief Complaint  ?Patient presents with  ? Atrial Fibrillation  ? Nausea  ? ? ?Brief Narrative:  ?Jeffrey Ayala is Jeffrey Ayala 61 y.o. male with PMH significant for arthritis, bilateral knee replacement, gastric ulcer, kidney stones, eosinophilic esophagitis, migraines not on any regular meds.   ?Patient presented to the ED on 4/2 with complaint of nausea, vomiting, diarrhea and confusion for 24 hours.   ?EMS noted him to be in Jeffrey Ayala with Jeffrey Ayala heart rate of 120-160 bpm. ?In the ED, he had Jeffrey Ayala fever of 102.9. ?CT abdomen pelvis showed swollen scrotum concerning for infection, colon filled with diarrhea.  ?He was hypotensive requiring multiple vasopressors in the ICU.   ?4/3, patient required intubation and also had Jeffrey Ayala brief cardiac arrest postintubation.   ?4/5, in an effort to correct vasoplegia, patient was also started on [name] ?Because of septic shock, patient's renal function worsened. ?4/6, patient was started on CRRT ?4/11, patient was extubated. ?4/14, patient was transferred out to [facility] service. ?4/23, underwent left transmetatarsal amputation and right BKA by [name] ?4/24, scrotum debridement and subsequent ICU readmission ?4/27, transfer to [facility] service ?4/29, scrotal wound culture with E. Faecalis and MDR Pseudomonas aeruginosa; ID consulted and patient transitioned to Ampicillin, Avycaz and Flagyl ?5/1, increased lucidity. ID transitioned antibiotics to Augmentin and Zerbaxa ?   ? ? ?Assessment & Plan: ?  ?Principal Problem: ?  Septic shock ([place]) ?Active Problems: ?  Cardiac arrest [facility]) ?  Scrotal infection ?  Anemia ?  Gangrene of right foot ([place]) ?  Gangrene of left foot ([place]) ?  AKI (acute kidney injury) ([place]) ?  Atrial fibrillation with RVR ([place]) ?  Altered mental status ?  Diarrhea ?  Shock liver ?  Acute respiratory failure with hypoxia ([place]) ?  Gout ?  Acute pain ?   Demand ischemia ([place]) ?  Thrombocytopenia ([place]) ?  Malnutrition of moderate degree ?  Pressure injury of skin ?  Encounter for central line placement ?  Severe protein-calorie malnutrition ([place]) ?  Hypotension ?  Respiratory insufficiency ?  Cellulitis ?  Obesity, Class III, BMI 40-49.9 (morbid obesity) ([place]) ? ? ?Assessment and Plan: ?* Septic shock ([place]) ?Due to scrotal infection ?Required ICU admission with vasopressor support ? ?Cardiac arrest [facility]) ?"brief" cardiac arrest post intubation on 4/3 ?Echo with normal EF, no RWMA ?No additional plans for ischemic testing at this time per cards ? ? ?Anemia ?Down to 5.2 today ?Will follow  ?Follow anemia labs - c/w aocd ?S/p 4 units pRBC ?Transfuse another 2 units pRBC today ? ?Scrotal infection ?Dry gangrene of anterior scrotum, s/p debridement on 4/24 ?S/p irrigation and debridement of scrotum with wound closure on 5/6 ?MDR pseudomonas, enterococcus faecalis, bacteroides ovalis, beta lactamase positive ?Continue zerbaxa and augmentin per ID -> will need to follow up with ID on 5/8 regarding long term abx plan (brief note pending) ?Pain management ? ? ? ? ?Gangrene of left foot ([place]) ?S/p L TMA and right BKA with [name] on 4/23 ?Per ID, no abx indicated at this time ? ?Gangrene of right foot ([place]) ?S/p L TMA and right BKA with [name] on 4/23 ?Per ID, no abx indicated at this time ? ?AKI (acute kidney injury) ([place]) ?Thought 2/2 ATN from septic shock ?Anuric and dialysis dependent since 02/26/2022 ?Per renal ? ?Atrial fibrillation with RVR ([place]) ?New diagnosis.  ?PROGRESS NOTE ? ? ? ?Jeffrey Ayala  HYQ:[id number] DOB: [birth year] [name]: 02/22/2022 ?PCP: Patient, No Pcp Per (Inactive)  ?Chief Complaint  ?Patient presents with  ? Atrial Fibrillation  ? Nausea  ? ? ?Brief Narrative:  ?Jeffrey Ayala is Jeffrey Ayala 61 y.o. male with PMH significant for arthritis, bilateral knee replacement, gastric ulcer, kidney stones, eosinophilic esophagitis, migraines not on any regular meds.   ?Patient presented to the ED on 4/2 with complaint of nausea, vomiting, diarrhea and confusion for 24 hours.   ?EMS noted him to be in Jeffrey Ayala with Jeffrey Ayala heart rate of 120-160 bpm. ?In the ED, he had Jeffrey Ayala fever of 102.9. ?CT abdomen pelvis showed swollen scrotum concerning for infection, colon filled with diarrhea.  ?He was hypotensive requiring multiple vasopressors in the ICU.   ?4/3, patient required intubation and also had Jeffrey Ayala brief cardiac arrest postintubation.   ?4/5, in an effort to correct vasoplegia, patient was also started on [name] ?Because of septic shock, patient's renal function worsened. ?4/6, patient was started on CRRT ?4/11, patient was extubated. ?4/14, patient was transferred out to [facility] service. ?4/23, underwent left transmetatarsal amputation and right BKA by [name] ?4/24, scrotum debridement and subsequent ICU readmission ?4/27, transfer to [facility] service ?4/29, scrotal wound culture with E. Faecalis and MDR Pseudomonas aeruginosa; ID consulted and patient transitioned to Ampicillin, Avycaz and Flagyl ?5/1, increased lucidity. ID transitioned antibiotics to Augmentin and Zerbaxa ?   ? ? ?Assessment & Plan: ?  ?Principal Problem: ?  Septic shock ([place]) ?Active Problems: ?  Cardiac arrest [facility]) ?  Scrotal infection ?  Anemia ?  Gangrene of right foot ([place]) ?  Gangrene of left foot ([place]) ?  AKI (acute kidney injury) ([place]) ?  Atrial fibrillation with RVR ([place]) ?  Altered mental status ?  Diarrhea ?  Shock liver ?  Acute respiratory failure with hypoxia ([place]) ?  Gout ?  Acute pain ?   Demand ischemia ([place]) ?  Thrombocytopenia ([place]) ?  Malnutrition of moderate degree ?  Pressure injury of skin ?  Encounter for central line placement ?  Severe protein-calorie malnutrition ([place]) ?  Hypotension ?  Respiratory insufficiency ?  Cellulitis ?  Obesity, Class III, BMI 40-49.9 (morbid obesity) ([place]) ? ? ?Assessment and Plan: ?* Septic shock ([place]) ?Due to scrotal infection ?Required ICU admission with vasopressor support ? ?Cardiac arrest [facility]) ?"brief" cardiac arrest post intubation on 4/3 ?Echo with normal EF, no RWMA ?No additional plans for ischemic testing at this time per cards ? ? ?Anemia ?Down to 5.2 today ?Will follow  ?Follow anemia labs - c/w aocd ?S/p 4 units pRBC ?Transfuse another 2 units pRBC today ? ?Scrotal infection ?Dry gangrene of anterior scrotum, s/p debridement on 4/24 ?S/p irrigation and debridement of scrotum with wound closure on 5/6 ?MDR pseudomonas, enterococcus faecalis, bacteroides ovalis, beta lactamase positive ?Continue zerbaxa and augmentin per ID -> will need to follow up with ID on 5/8 regarding long term abx plan (brief note pending) ?Pain management ? ? ? ? ?Gangrene of left foot ([place]) ?S/p L TMA and right BKA with [name] on 4/23 ?Per ID, no abx indicated at this time ? ?Gangrene of right foot ([place]) ?S/p L TMA and right BKA with [name] on 4/23 ?Per ID, no abx indicated at this time ? ?AKI (acute kidney injury) ([place]) ?Thought 2/2 ATN from septic shock ?Anuric and dialysis dependent since 02/26/2022 ?Per renal ? ?Atrial fibrillation with RVR ([place]) ?New diagnosis.  ?PROGRESS NOTE ? ? ? ?Jeffrey Ayala  HYQ:[id number] DOB: [birth year] [name]: 02/22/2022 ?PCP: Patient, No Pcp Per (Inactive)  ?Chief Complaint  ?Patient presents with  ? Atrial Fibrillation  ? Nausea  ? ? ?Brief Narrative:  ?Jeffrey Ayala is Jeffrey Ayala 61 y.o. male with PMH significant for arthritis, bilateral knee replacement, gastric ulcer, kidney stones, eosinophilic esophagitis, migraines not on any regular meds.   ?Patient presented to the ED on 4/2 with complaint of nausea, vomiting, diarrhea and confusion for 24 hours.   ?EMS noted him to be in Jeffrey Ayala with Jeffrey Ayala heart rate of 120-160 bpm. ?In the ED, he had Jeffrey Ayala fever of 102.9. ?CT abdomen pelvis showed swollen scrotum concerning for infection, colon filled with diarrhea.  ?He was hypotensive requiring multiple vasopressors in the ICU.   ?4/3, patient required intubation and also had Jeffrey Ayala brief cardiac arrest postintubation.   ?4/5, in an effort to correct vasoplegia, patient was also started on [name] ?Because of septic shock, patient's renal function worsened. ?4/6, patient was started on CRRT ?4/11, patient was extubated. ?4/14, patient was transferred out to [facility] service. ?4/23, underwent left transmetatarsal amputation and right BKA by [name] ?4/24, scrotum debridement and subsequent ICU readmission ?4/27, transfer to [facility] service ?4/29, scrotal wound culture with E. Faecalis and MDR Pseudomonas aeruginosa; ID consulted and patient transitioned to Ampicillin, Avycaz and Flagyl ?5/1, increased lucidity. ID transitioned antibiotics to Augmentin and Zerbaxa ?   ? ? ?Assessment & Plan: ?  ?Principal Problem: ?  Septic shock ([place]) ?Active Problems: ?  Cardiac arrest [facility]) ?  Scrotal infection ?  Anemia ?  Gangrene of right foot ([place]) ?  Gangrene of left foot ([place]) ?  AKI (acute kidney injury) ([place]) ?  Atrial fibrillation with RVR ([place]) ?  Altered mental status ?  Diarrhea ?  Shock liver ?  Acute respiratory failure with hypoxia ([place]) ?  Gout ?  Acute pain ?   Demand ischemia ([place]) ?  Thrombocytopenia ([place]) ?  Malnutrition of moderate degree ?  Pressure injury of skin ?  Encounter for central line placement ?  Severe protein-calorie malnutrition ([place]) ?  Hypotension ?  Respiratory insufficiency ?  Cellulitis ?  Obesity, Class III, BMI 40-49.9 (morbid obesity) ([place]) ? ? ?Assessment and Plan: ?* Septic shock ([place]) ?Due to scrotal infection ?Required ICU admission with vasopressor support ? ?Cardiac arrest [facility]) ?"brief" cardiac arrest post intubation on 4/3 ?Echo with normal EF, no RWMA ?No additional plans for ischemic testing at this time per cards ? ? ?Anemia ?Down to 5.2 today ?Will follow  ?Follow anemia labs - c/w aocd ?S/p 4 units pRBC ?Transfuse another 2 units pRBC today ? ?Scrotal infection ?Dry gangrene of anterior scrotum, s/p debridement on 4/24 ?S/p irrigation and debridement of scrotum with wound closure on 5/6 ?MDR pseudomonas, enterococcus faecalis, bacteroides ovalis, beta lactamase positive ?Continue zerbaxa and augmentin per ID -> will need to follow up with ID on 5/8 regarding long term abx plan (brief note pending) ?Pain management ? ? ? ? ?Gangrene of left foot ([place]) ?S/p L TMA and right BKA with [name] on 4/23 ?Per ID, no abx indicated at this time ? ?Gangrene of right foot ([place]) ?S/p L TMA and right BKA with [name] on 4/23 ?Per ID, no abx indicated at this time ? ?AKI (acute kidney injury) ([place]) ?Thought 2/2 ATN from septic shock ?Anuric and dialysis dependent since 02/26/2022 ?Per renal ? ?Atrial fibrillation with RVR ([place]) ?New diagnosis.

## 2022-03-29 NOTE — Progress Notes (Signed)
HOSPITAL MEDICINE OVERNIGHT EVENT NOTE   ? ?Nursing reports hemoglobin of 5.2 this morning, down from 7.7 yesterday.  Nursing states that there is no clinical evidence of bleeding.  Patient denies chest pain or shortness of breath.  Patient is hemodynamically stable. ? ?Chart reviewed, patient has been experiencing ongoing anemia throughout his prolonged hospitalization.  Patient has received a total of 4 units of packed red blood cells and separate transfusions during this hospital stay.  Patient was previously on heparin which has since been discontinued due to bleeding complications. ? ?2 units of packed red blood cells ordered to be transfused slowly with posttransfusion hemoglobin and hematocrit.  Continue to monitor for any evidence of bleeding. ? ?Vernelle Emerald  MD ?[facility]  ? ? ? ? ? ? ? ? ? ? ?

## 2022-03-30 DIAGNOSIS — A419 Sepsis, unspecified organism: Secondary | ICD-10-CM | POA: Diagnosis not present

## 2022-03-30 DIAGNOSIS — N493 Fournier gangrene: Secondary | ICD-10-CM

## 2022-03-30 DIAGNOSIS — R6521 Severe sepsis with septic shock: Secondary | ICD-10-CM | POA: Diagnosis not present

## 2022-03-30 LAB — CBC
HCT: 21 % — ABNORMAL LOW (ref 39.0–52.0)
Hemoglobin: 6.8 g/dL — CL (ref 13.0–17.0)
MCH: 29.2 pg (ref 26.0–34.0)
MCHC: 32.4 g/dL (ref 30.0–36.0)
MCV: 90.1 fL (ref 80.0–100.0)
Platelets: 187 10*3/uL (ref 150–400)
RBC: 2.33 MIL/uL — ABNORMAL LOW (ref 4.22–5.81)
RDW: 16.5 % — ABNORMAL HIGH (ref 11.5–15.5)
WBC: 7.5 10*3/uL (ref 4.0–10.5)
nRBC: 0 % (ref 0.0–0.2)

## 2022-03-30 LAB — PREPARE RBC (CROSSMATCH)

## 2022-03-30 LAB — RENAL FUNCTION PANEL
Albumin: 2.5 g/dL — ABNORMAL LOW (ref 3.5–5.0)
Anion gap: 10 (ref 5–15)
BUN: 60 mg/dL — ABNORMAL HIGH (ref 8–23)
CO2: 24 mmol/L (ref 22–32)
Calcium: 8.2 mg/dL — ABNORMAL LOW (ref 8.9–10.3)
Chloride: 104 mmol/L (ref 98–111)
Creatinine, Ser: 7.69 mg/dL — ABNORMAL HIGH (ref 0.61–1.24)
GFR, Estimated: 7 mL/min — ABNORMAL LOW (ref >60)
Glucose, Bld: 105 mg/dL — ABNORMAL HIGH (ref 70–99)
Phosphorus: 9.2 mg/dL — ABNORMAL HIGH (ref 2.5–4.6)
Potassium: 4.2 mmol/L (ref 3.5–5.1)
Sodium: 138 mmol/L (ref 135–145)

## 2022-03-30 LAB — OCCULT BLOOD X 1 CARD TO LAB, STOOL: Fecal Occult Bld: NEGATIVE

## 2022-03-30 LAB — MAGNESIUM: Magnesium: 2.1 mg/dL (ref 1.7–2.4)

## 2022-03-30 LAB — GLUCOSE, CAPILLARY
Glucose-Capillary: 110 mg/dL — ABNORMAL HIGH (ref 70–99)
Glucose-Capillary: 136 mg/dL — ABNORMAL HIGH (ref 70–99)
Glucose-Capillary: 83 mg/dL (ref 70–99)
Glucose-Capillary: 150 mg/dL — ABNORMAL HIGH (ref 70–99)

## 2022-03-30 LAB — HEMOGLOBIN AND HEMATOCRIT, BLOOD
HCT: 25.7 % — ABNORMAL LOW (ref 39.0–52.0)
Hemoglobin: 8.9 g/dL — ABNORMAL LOW (ref 13.0–17.0)

## 2022-03-30 LAB — PATHOLOGIST SMEAR REVIEW

## 2022-03-30 MED ORDER — SODIUM CHLORIDE 0.9% IV SOLUTION: Freq: Once | INTRAVENOUS | Status: DC

## 2022-03-30 MED ORDER — PANTOPRAZOLE SODIUM 40 MG PO TBEC
40.0000 mg | DELAYED_RELEASE_TABLET | Freq: Every day | ORAL | Status: DC
Start: 2022-03-30 — End: 2022-04-03
  Administered 2022-03-30 – 2022-04-03 (×5): 40 mg via ORAL
  Filled 2022-03-30 (×5): qty 1

## 2022-03-30 MED ORDER — QUETIAPINE FUMARATE 25 MG PO TABS
25.0000 mg | ORAL_TABLET | Freq: Every day | ORAL | Status: DC
  Administered 2022-03-30 – 2022-04-02 (×4): 25 mg via ORAL
  Filled 2022-03-30 (×5): qty 1

## 2022-03-30 MED ORDER — GUAIFENESIN 100 MG/5ML PO LIQD
400.0000 mg | Freq: Three times a day (TID) | ORAL | Status: DC
  Administered 2022-03-31 – 2022-04-02 (×4): 400 mg via ORAL
  Filled 2022-03-30: qty 10
  Filled 2022-03-30 (×2): qty 50
  Filled 2022-03-30 (×5): qty 10

## 2022-03-30 NOTE — Progress Notes (Signed)
Patient ID: Jeffrey Ayala, male   DOB: [birth year], 61 y.o.   MRN: [id number] ?[facility] ?Progress Note  ? ?Assessment/ Plan:   ?1. Acute kidney Injury: ATN from severe septic shock in the setting of foot wounds.  Has been dialysis dependent since 02/26/2022 (initially started with CRRT and transition to IHD).   ?- HD today and per MWF schedule ?- monitor for renal recovery however given prolonged dependence unsure of likelihood for recovery.   ?- will clip as AKI for now and watch for recovery, though may be end stage  ? ?2.  Scrotal edema/wound: Status post surgical debridement by urology on 4/24 and on 5/6 underwent irrigation and debridement of scrotum with wound closure and drain placement.  He remains on Augmentin and Zerbaxa for antimicrobial coverage.   ? ?3.  Leg wound status post left TMA and right below-knee amputation on 03/15/2022: Septic shock resolved and remains on antimicrobial coverage. ? ?4.  Anemia - acute blood loss: s/p PRBC's x 2 units on 5/7.  Will give PRBC's on 5/8 as well.  Spoke with team   ? ?5.  Hyperphosphatemia: Secondary to acute kidney injury.  On binders and HD.  Note Switched from sevelamer to Fosrenol. ? ?Disposition - still needs an outpatient HD unit.  Not able to be discharged without that plan in place.  spoke with HD SW today to clip as AKI ? ?Subjective:   ?Last HD on 5/5 with 2 kg UF.  He had 600 mL UOP over 5/7.   ? ?Review of systems: ?Denies shortness of breath or chest pain  ?Denies n/v ?  ? ?Objective:   ?BP 127/85 (BP Location: Right Arm)   Pulse 89   Temp 97.9 ?F (36.6 ?C) (Oral)   Resp 16   Ht '5\' 10"'$  (1.778 m)   Wt (!) 143 kg   SpO2 100%   BMI 45.23 kg/m?  ? ?Intake/Output Summary (Last 24 hours) at 03/30/2022 1054 ?Last data filed at 03/30/2022 0600 ?Gross per 24 hour  ?Intake 1520 ml  ?Output 1100 ml  ?Net 420 ml  ? ?Weight change: 29.2 kg ? ?Physical Exam:    ?General adult male in bed in no acute distress ?HEENT normocephalic atraumatic  extraocular movements intact sclera anicteric ?Neck supple trachea midline ?Lungs clear to auscultation bilaterally normal work of breathing at rest  ?Heart S1S2 no rub ?Abdomen soft nontender nondistended ?Ext: Status post left TMA and right BKA   ?Psych normal mood and affect ?Access: RIJ tunn catheter ? ?Imaging: ?No results found. ? ?Labs: ?BMET ?Recent Labs  ?Lab 03/24/22 ?0159 03/25/22 ?0158 03/26/22 ?0151 03/27/22 ?0217 03/28/22 ?0320 03/29/22 ?0200 03/30/22 ?0552  ?NA 137 138 139 140 139 140  140 138  ?K 3.5 3.9 3.6 3.8 3.7 4.0  4.0 4.2  ?CL 99 102 103 104 103 103  102 104  ?CO2 '24 22 27 26 26 27  26 24  '$ ?GLUCOSE 104* 107* 98 103* 105* 132*  129* 105*  ?BUN 47* 66* 34* 50* 28* 41*  42* 60*  ?CREATININE 5.24* 7.08* 4.81* 6.46* 4.67* 6.35*  6.32* 7.69*  ?CALCIUM 8.3* 8.4* 8.3* 8.3* 8.4* 8.1*  8.1* 8.2*  ?PHOS 5.4* 7.4* 4.9* 6.3* 4.8* 7.2*  7.2* 9.2*  ? ?CBC ?Recent Labs  ?Lab 03/27/22 ?0217 03/28/22 ?0320 03/29/22 ?0200 03/29/22 ?1458 03/29/22 ?2059  ?WBC 5.9 6.1 7.6  --  6.7  ?NEUTROABS 3.8 4.4 5.2  --  4.7  ?HGB 7.3* 7.7* 5.2* 6.2* 6.1*  ?  HCT 22.7* 24.5* 16.4* 19.2* 19.2*  ?MCV 93.4 92.8 93.7  --  91.9  ?PLT 236 255 229  --  187  ? ? ?Medications:   ? ? (feeding supplement) PROSource Plus  30 mL Oral TID BM  ? sodium chloride   Intravenous Once  ? acetaminophen  650 mg Oral Q6H  ? allopurinol  100 mg Oral Daily  ? amoxicillin-clavulanate  1 tablet Oral BID  ? vitamin C  1,000 mg Oral Daily  ? aspirin  81 mg Oral Daily  ? Chlorhexidine Gluconate Cloth  6 each Topical Q0600  ? darbepoetin (ARANESP) injection - DIALYSIS  200 mcg Intravenous Q Mon-HD  ? feeding supplement (NEPRO CARB STEADY)  237 mL Oral BID BM  ? fiber  1 packet Oral BID  ? folic acid  1 mg Oral Daily  ? guaiFENesin  400 mg Oral Q8H  ? heparin injection (subcutaneous)  5,000 Units Subcutaneous Q8H  ? lanthanum  1,000 mg Oral TID WC  ? leptospermum manuka honey  1 application. Topical Daily  ? melatonin  3 mg Oral QHS  ? metoprolol  tartrate  37.5 mg Oral TID  ? multivitamin  1 tablet Oral QHS  ? nutrition supplement (JUVEN)  1 packet Oral BID BM  ? pantoprazole  40 mg Oral Daily  ? QUEtiapine  50 mg Oral QHS  ? sodium chloride flush  3 mL Intravenous Q12H  ? sodium hypochlorite   Irrigation BID  ? ?Claudia Desanctis, MD ?03/30/2022, 12:39 PM ? ? ?

## 2022-03-30 NOTE — Procedures (Signed)
Pt arrived to hemodialysis with BP of 125/69, Permcath dressing is intact and occlusive.  No issues with permcath as both limbs draw and flush easily.  Order received to give 2 units of PRBCs and all was given over the treatment time.  No signs of distress noted.  Final BP was 139/76 and UF removed was 2L.  Medication given was Aranesp. ?

## 2022-03-30 NOTE — Progress Notes (Signed)
?    [facility] for Infectious Disease ? ? ?Reason for visit: Follow up on scrotal infection ? ?Interval History: no new positive cultures ?Day 3 Zerbaxa and Augmentin ? Day 32 total antibiotics ? ?Physical Exam: ?Constitutional:  ?Vitals:  ? 03/30/22 1530 03/30/22 1545  ?BP: 135/78 137/81  ?Pulse: 98 (!) 103  ?Resp: 20 14  ?Temp: 98.4 ?F (36.9 ?C) 98.2 ?F (36.8 ?C)  ?SpO2: 100% 100%  ?Getting dialysis ?Normocephalic; per ?Neck supple ?Normal respiratory effort; lungs clear breath sound ?Ext s/p left tma and right bka; right bka stump with wound vac ?GU -- didn't examine today ? ? ?Review of Systems: ?No fever, chill, n/v/diarrhea, rash ?Slight tenderness in groin but better ? ? ?Lab Results  ?Component Value Date  ? WBC 7.5 03/30/2022  ? HGB 6.8 (LL) 03/30/2022  ? HCT 21.0 (L) 03/30/2022  ? MCV 90.1 03/30/2022  ? PLT 187 03/30/2022  ?  ?Lab Results  ?Component Value Date  ? CREATININE 7.69 (H) 03/30/2022  ? BUN 60 (H) 03/30/2022  ? NA 138 03/30/2022  ? K 4.2 03/30/2022  ? CL 104 03/30/2022  ? CO2 24 03/30/2022  ?  ?Lab Results  ?Component Value Date  ? ALT 12 03/29/2022  ? AST 21 03/29/2022  ? ALKPHOS 71 03/29/2022  ?  ? ?Microbiology: ?No results found for this or any previous visit (from the past 240 hour(s)). ? ? ?Impression/Plan:  ?1. Scrotal infection - has a MDR Pseudomonas, also with Enterococcus and Bacteroides in culture and on appropriate antibiotics with Zyrbaxa and Augmentin.  ? ?Patient s/p last I&D on 5/06 by urology and has perineal/scrotal area primarily closed ? ?He clinically is doing much better. I reviewed urology assessment today and dr Alinda Money is happy with what he is seeing. There is a wound drain placed planned to be removed this week ? ?-at this time would be reasonable to give 5 more days more current antibiotics (for 7 days post the last surgery) ?-will see in id clinic for f/u on 5/19 @ 330 pm (please reengage ID if he is still in the hospital by that time) ? ? ?2.  Dry gangrene  bilateral feet - s/p amputation with a wound VAC.  No antibiotics indicated.   ? ?3.  Acute kidney injury - now dialysis dependent.  Will avoid picc line if he needs access at discharge for above.   ? ?  ? ?Discussed with primary team ? ?

## 2022-03-30 NOTE — Progress Notes (Signed)
Inpatient Rehabilitation Admissions Coordinator  ? ?I await updated PT and OT to begin Auth for possible [facility] admit. ? ?Danne [place], RN, MSN ?Rehab Admissions Coordinator ?([phone] ?03/30/2022 12:17 PM ? ?

## 2022-03-30 NOTE — Progress Notes (Signed)
PROGRESS NOTE    KAREN SOTTO  ZOX:[id number] DOB: Dec 20, 1960 [name]: 02/22/2022 PCP: Patient, No Pcp Per (Inactive)  Chief Complaint  Patient presents with   Atrial Fibrillation   Nausea    Brief Narrative:  DHANVIN STORZ is Parveen Freehling 61 y.o. male with PMH significant for arthritis, bilateral knee replacement, gastric ulcer, kidney stones, eosinophilic esophagitis, migraines not on any regular meds.   Patient presented to the ED on 4/2 with complaint of nausea, vomiting, diarrhea and confusion for 24 hours.   EMS noted him to be in Karlos Scadden-fib with Morey Andonian heart rate of 120-160 bpm. In the ED, he had Shahmeer Bunn fever of 102.9. CT abdomen pelvis showed swollen scrotum concerning for infection, colon filled with diarrhea.  He was hypotensive requiring multiple vasopressors in the ICU.   4/3, patient required intubation and also had Francee Setzer brief cardiac arrest postintubation.   4/5, in an effort to correct vasoplegia, patient was also started on [name] Because of septic shock, patient's renal function worsened. 4/6, patient was started on CRRT 4/11, patient was extubated. 4/14, patient was transferred out to [facility] service. 4/23, underwent left transmetatarsal amputation and right BKA by [name] 4/24, scrotum debridement and subsequent ICU readmission 4/27, transfer to [facility] service 4/29, scrotal wound culture with E. Faecalis and MDR Pseudomonas aeruginosa; ID consulted and patient transitioned to Ampicillin, Avycaz and Flagyl 5/1, increased lucidity. ID transitioned antibiotics to Augmentin and Zerbaxa      Assessment & Plan:   Principal Problem:   Septic shock ([name]) Active Problems:   Cardiac arrest ([name])   Scrotal infection   Anemia   Gangrene of right foot ([name])   Gangrene of left foot ([name])   AKI (acute kidney injury) ([name])   Atrial fibrillation with RVR ([name])   Altered mental status   Diarrhea   Shock liver   Acute respiratory failure with hypoxia ([name])   Gout   Acute pain    Demand ischemia ([name])   Thrombocytopenia ([name])   Malnutrition of moderate degree   Pressure injury of skin   Encounter for central line placement   Severe protein-calorie malnutrition ([name])   Hypotension   Respiratory insufficiency   Cellulitis   Obesity, Class III, BMI 40-49.9 (morbid obesity) ([name])   Assessment and Plan: * Septic shock ([name]) Due to scrotal infection Required ICU admission with vasopressor support  Cardiac arrest [facility]) "brief" cardiac arrest post intubation on 4/3 Echo with normal EF, no RWMA No additional plans for ischemic testing at this time per cards   Anemia Inadequate response to pRBC S/p 3 units on 5/7 -> 6.8 Another 2 units ordered Suspect most likely post op given fall after surgery Follow CT scan Smear review pending Negative hemoccult Follow anemia labs - c/w aocd S/p 9 total units pRBC  Scrotal infection Dry gangrene of anterior scrotum, s/p debridement on 4/24 S/p irrigation and debridement of scrotum with wound closure on 5/6 MDR pseudomonas, enterococcus faecalis, bacteroides ovalis, beta lactamase positive Continue zerbaxa and augmentin per ID -> will need to follow up with ID on 5/8 regarding long term abx plan (message sent today) Pain management     Gangrene of left foot ([name]) S/p L TMA and right BKA with [name] on 4/23 Per ID, no abx indicated at this time  Gangrene of right foot ([name]) S/p L TMA and right BKA with [name] on 4/23 Per ID, no abx indicated at this time  AKI (acute kidney injury) ([name]) Thought 2/2  ATN from septic shock Anuric and dialysis dependent since 02/26/2022 Per renal  Atrial fibrillation with RVR ([name]) New diagnosis. CHA2DS2-VASc Score is 1 (PAD).Transthoracic Echocardiogram with preserved LVEF. Mostly rate controlled but did have some associated tachycardia while agitated. Initially was managed on heparin IV which was discontinued secondary to anemia. Cardiology consulted. Patient started on  metoprolol 37.5 mg TID and aspirin 81 mg.  -Continue metoprolol 37.5 mg TID and aspirin 81 mg daily - needs outpatient cardiology follow up  Altered mental status Unsure of specific etiology. Possibly secondary to critical illness but also complicated by uremia and concern for possible hypoxemic injury while in septic shock. CT head unremarkable for acute process. Patient started on Seroquel for hopeful management with not much improvement. Patient with waxing and waning symptoms. Normal vitamin B1 and B12. Patient found to have likely smoldering infection that might have contributed to persistent mental status issues. -Delirium precautions -Continue Seroquel BID; will discontinue AM seroquel for now - follow and will deescalate/d/c as able -Haldol PRN  Diarrhea Negative c diff on 4/2 Negative gi path panel on 4/17 Has rectal tube in place  Shock liver improved  Acute respiratory failure with hypoxia [facility]) Patient intubated from 4/3 - 4/10, 4/23 - 4/23, 4/24 - 4/25. Resolved.  Gout Now on allopurinol Colchicine has now been d/c'd  Demand ischemia ([name]) In setting of septic shock and cardiac arrest Per cards, due to demand ischemia  Thrombocytopenia ([name]) S/p 1 unit platelets  Malnutrition of moderate degree In setting of altered mentation. Dietitian consulted. Small bore NG tube placed on 4/17 and tube feeds initiated. Unfortunately, NG tube mistakenly removed on 4/28. SLP reevaluated on 4/29 with recommendation for dysphagia 3 diet. Currently undergoing calorie count - met caloric needs.  -SLP recommendations (4/29):   Diet recommendations: Dysphagia 3 (mechanical soft);Thin liquid Liquids provided via: Cup;Straw Medication Administration: Crushed with puree Supervision: Staff to assist with self feeding;Full supervision/cueing for compensatory strategies;Trained caregiver to feed patient Compensations: Minimize environmental distractions;Slow rate;Small sips/bites Postural  Changes and/or Swallow Maneuvers: Seated upright 90 degrees     Pressure injury of skin Pressure Injury 02/22/22 Coccyx Medial Stage 2 -  Partial thickness loss of dermis presenting as Roshonda Sperl shallow open injury with Daily Crate red, pink wound bed without slough. pink, painful (Active)  02/22/22 2330  Location: Coccyx  Location Orientation: Medial  Staging: Stage 2 -  Partial thickness loss of dermis presenting as Aaradhya Kysar shallow open injury with Jeanclaude Wentworth red, pink wound bed without slough.  Wound Description (Comments): pink, painful  Present on Admission: Yes       DVT prophylaxis: SCD Code Status: full Family Communication: none Disposition:   Status is: Inpatient Remains inpatient appropriate because: need for abx, urology intervention   Consultants:  Urology ID PCCM Cards Nephrology Vascular surgery  Procedures:  LEFT TRANSMETATARSAL AMPUTATION, RIGHT BELOW KNEE AMPUTATION (4/23) Debridement of scrotum (4/24) Echo IMPRESSIONS     1. Left ventricular ejection fraction, by estimation, is 60 to 65%. The  left ventricle has normal function. The left ventricle has no regional  wall motion abnormalities. There is mild left ventricular hypertrophy.  Left ventricular diastolic function  could not be evaluated.   2. Right ventricular systolic function is normal. The right ventricular  size is normal.   3. The mitral valve is normal in structure. No evidence of mitral valve  regurgitation. No evidence of mitral stenosis.   4. The aortic valve is tricuspid. Aortic valve regurgitation is not  visualized. No aortic stenosis  is present.   5. The inferior vena cava is dilated in size with <50% respiratory  variability, suggesting right atrial pressure of 15 mmHg.   Comparison(s): No prior Echocardiogram.    ABI  Summary:  Right: Resting right ankle-brachial index indicates severe right lower  extremity arterial disease.   Unable to obtain TBI due to low amplitude waveforms.  Left: Resting  left ankle-brachial index indicates noncompressible left  lower extremity arteries.    ABI Summary:  Right: Resting right ankle-brachial index indicates noncompressible right  lower extremity arteries.   Left: Resting left ankle-brachial index indicates noncompressible left  lower extremity arteries. The left toe-brachial index is abnormal.   Antimicrobials:  Anti-infectives (From admission, onward)    Start     Dose/Rate Route Frequency Ordered Stop   03/29/22 0925  ceftolozane-tazobactam (ZERBAXA) 150 mg in sodium chloride 0.9 % 100 mL IVPB        150 mg 101.1 mL/hr over 60 Minutes Intravenous Every 8 hours 03/29/22 0926 04/03/22 2359   03/23/22 1900  ceftolozane-tazobactam (ZERBAXA) 150 mg in sodium chloride 0.9 % 100 mL IVPB  Status:  Discontinued        150 mg 101.1 mL/hr over 60 Minutes Intravenous Every 8 hours 03/23/22 1056 03/23/22 1057   03/23/22 1900  ceftolozane-tazobactam (ZERBAXA) 150 mg in sodium chloride 0.9 % 100 mL IVPB  Status:  Discontinued        150 mg 101.1 mL/hr over 60 Minutes Intravenous Every 8 hours 03/23/22 1057 03/29/22 0926   03/23/22 1115  amoxicillin-clavulanate (AUGMENTIN) 500-125 MG per tablet 500 mg        1 tablet Oral 2 times daily 03/23/22 1103 04/03/22 2359   03/23/22 1100  ceftolozane-tazobactam (ZERBAXA) 750 mg in sodium chloride 0.9 % 100 mL IVPB  Status:  Discontinued        750 mg 105.7 mL/hr over 60 Minutes Intravenous  Once 03/23/22 1056 03/23/22 1057   03/23/22 1100  ceftolozane-tazobactam (ZERBAXA) 750 mg in sodium chloride 0.9 % 100 mL IVPB        750 mg 105.7 mL/hr over 60 Minutes Intravenous  Once 03/23/22 1057 03/23/22 1543   03/21/22 2000  ampicillin (OMNIPEN) 2 g in sodium chloride 0.9 % 100 mL IVPB  Status:  Discontinued        2 g 300 mL/hr over 20 Minutes Intravenous Every 24 hours 03/21/22 1121 03/23/22 1103   03/21/22 1300  ceftazidime-avibactam (AVYCAZ) 0.94 g in dextrose 5 % 50 mL IVPB  Status:  Discontinued        0.94  g 25 mL/hr over 2 Hours Intravenous Every 24 hours 03/21/22 1121 03/23/22 1056   03/21/22 1200  metroNIDAZOLE (FLAGYL) IVPB 500 mg  Status:  Discontinued        500 mg 100 mL/hr over 60 Minutes Intravenous Every 12 hours 03/21/22 1121 03/21/22 1131   03/21/22 1200  metroNIDAZOLE (FLAGYL) IVPB 500 mg  Status:  Discontinued        500 mg 100 mL/hr over 60 Minutes Intravenous Every 12 hours 03/21/22 1131 03/23/22 1103   03/18/22 1800  vancomycin (VANCOCIN) IVPB 1000 mg/200 mL premix  Status:  Discontinued        1,000 mg 200 mL/hr over 60 Minutes Intravenous Every M-W-F (1800) 03/18/22 0833 03/21/22 1139   03/16/22 1615  vancomycin (VANCOREADY) IVPB 500 mg/100 mL  Status:  Discontinued        500 mg 100 mL/hr over 60 Minutes Intravenous  Once 03/16/22 1606  03/16/22 1611   03/16/22 1200  vancomycin (VANCOCIN) IVPB 1000 mg/200 mL premix  Status:  Discontinued        1,000 mg 200 mL/hr over 60 Minutes Intravenous  Once 03/16/22 1058 03/16/22 1131   03/15/22 1400  ceFAZolin (ANCEF) IVPB 2g/100 mL premix  Status:  Discontinued        2 g 200 mL/hr over 30 Minutes Intravenous Every 8 hours 03/15/22 1007 03/15/22 1725   03/15/22 0600  ceFAZolin (ANCEF) IVPB 3g/100 mL premix        3 g 200 mL/hr over 30 Minutes Intravenous On call to O.R. 03/14/22 1911 03/15/22 0852   03/12/22 1200  vancomycin (VANCOCIN) IVPB 1000 mg/200 mL premix  Status:  Discontinued        1,000 mg 200 mL/hr over 60 Minutes Intravenous Every T-Th-Sa (Hemodialysis) 03/11/22 1117 03/16/22 1058   03/10/22 1815  vancomycin (VANCOREADY) IVPB 2000 mg/400 mL        2,000 mg 200 mL/hr over 120 Minutes Intravenous  Once 03/10/22 1721 03/10/22 2337   03/10/22 1815  ceFEPIme (MAXIPIME) 1 g in sodium chloride 0.9 % 100 mL IVPB  Status:  Discontinued        1 g 200 mL/hr over 30 Minutes Intravenous Daily at bedtime 03/10/22 1721 03/21/22 1139   03/09/22 0000  ceFAZolin (ANCEF) IVPB 2g/100 mL premix        2 g 200 mL/hr over 30 Minutes  Intravenous To Radiology 03/08/22 1113 03/09/22 0900   03/03/22 2200  piperacillin-tazobactam (ZOSYN) IVPB 2.25 g        2.25 g 100 mL/hr over 30 Minutes Intravenous Every 8 hours 03/03/22 1607 03/06/22 2359   02/26/22 1800  piperacillin-tazobactam (ZOSYN) IVPB 3.375 g  Status:  Discontinued        3.375 g 12.5 mL/hr over 240 Minutes Intravenous Every 8 hours 02/26/22 1343 02/26/22 1518   02/26/22 1800  piperacillin-tazobactam (ZOSYN) IVPB 3.375 g  Status:  Discontinued        3.375 g 100 mL/hr over 30 Minutes Intravenous Every 6 hours 02/26/22 1518 03/03/22 1607   02/25/22 1000  linezolid (ZYVOX) IVPB 600 mg  Status:  Discontinued        600 mg 300 mL/hr over 60 Minutes Intravenous Every 12 hours 02/25/22 0810 02/26/22 1443   02/25/22 0915  piperacillin-tazobactam (ZOSYN) IVPB 2.25 g  Status:  Discontinued        2.25 g 100 mL/hr over 30 Minutes Intravenous Every 8 hours 02/25/22 0823 02/26/22 1343   02/24/22 1400  vancomycin (VANCOCIN) IVPB 1000 mg/200 mL premix  Status:  Discontinued        1,000 mg 200 mL/hr over 60 Minutes Intravenous  Once 02/24/22 1312 02/24/22 1349   02/24/22 0555  ceFEPIme (MAXIPIME) 2 g in sodium chloride 0.9 % 100 mL IVPB  Status:  Discontinued        2 g 200 mL/hr over 30 Minutes Intravenous Every 24 hours 02/23/22 1335 02/25/22 0823   02/23/22 1343  vancomycin variable dose per unstable renal function (pharmacist dosing)  Status:  Discontinued         Does not apply See admin instructions 02/23/22 1344 02/25/22 0823   02/23/22 0800  vancomycin (VANCOREADY) IVPB 750 mg/150 mL  Status:  Discontinued        750 mg 150 mL/hr over 60 Minutes Intravenous Every 12 hours 02/22/22 1853 02/23/22 0731   02/22/22 2200  ceFEPIme (MAXIPIME) 2 g in sodium chloride 0.9 % 100  mL IVPB  Status:  Discontinued        2 g 200 mL/hr over 30 Minutes Intravenous Every 8 hours 02/22/22 1853 02/23/22 1335   02/22/22 1800  ceFEPIme (MAXIPIME) 2 g in sodium chloride 0.9 % 100 mL IVPB         2 g 200 mL/hr over 30 Minutes Intravenous  Once 02/22/22 1750 02/22/22 1848   02/22/22 1800  metroNIDAZOLE (FLAGYL) IVPB 500 mg        500 mg 100 mL/hr over 60 Minutes Intravenous  Once 02/22/22 1750 02/22/22 2003   02/22/22 1800  vancomycin (VANCOCIN) IVPB 1000 mg/200 mL premix  Status:  Discontinued        1,000 mg 200 mL/hr over 60 Minutes Intravenous  Once 02/22/22 1750 02/22/22 1755   02/22/22 1800  vancomycin (VANCOREADY) IVPB 2000 mg/400 mL        2,000 mg 200 mL/hr over 120 Minutes Intravenous  Once 02/22/22 1755 02/22/22 2217       Subjective: No new complaints  Objective: Vitals:   03/30/22 1500 03/30/22 1515 03/30/22 1530 03/30/22 1545  BP: 130/76 132/75 135/78 137/81  Pulse: 93 94 98 (!) 103  Resp: 17 (!) 24 20 14   Temp: 98.5 F (36.9 C) 98.5 F (36.9 C) 98.4 F (36.9 C) 98.2 F (36.8 C)  TempSrc:      SpO2: 100% 100% 100% 100%  Weight:      Height:        Intake/Output Summary (Last 24 hours) at 03/30/2022 1623 Last data filed at 03/30/2022 1530 Gross per 24 hour  Intake 1333 ml  Output 1100 ml  Net 233 ml   Filed Weights   03/27/22 1130 03/28/22 0752 03/30/22 0351  Weight: 113.8 kg 113.8 kg (!) 143 kg    Examination:  General: No acute distress. CardiovascularRRR Lungs: unlabored Abdomen: Soft, nontender, nondistended  GU dressing over scrotum Neurological: Alert and oriented 3. Moves all extremities 4. Cranial nerves II through XII grossly intact. Extremities: l TMA, R bkA wound vacs in place   Data Reviewed: I have personally reviewed following labs and imaging studies  CBC: Recent Labs  Lab 03/26/22 0151 03/27/22 0217 03/28/22 0320 03/29/22 0200 03/29/22 1458 03/29/22 2059 03/30/22 1156  WBC 5.0 5.9 6.1 7.6  --  6.7 7.5  NEUTROABS 3.1 3.8 4.4 5.2  --  4.7  --   HGB 7.3* 7.3* 7.7* 5.2* 6.2* 6.1* 6.8*  HCT 23.4* 22.7* 24.5* 16.4* 19.2* 19.2* 21.0*  MCV 92.9 93.4 92.8 93.7  --  91.9 90.1  PLT 239 236 255 229  --  187 187     Basic Metabolic Panel: Recent Labs  Lab 03/26/22 0151 03/27/22 0217 03/28/22 0320 03/29/22 0200 03/30/22 0552  NA 139 140 139 140  140 138  K 3.6 3.8 3.7 4.0  4.0 4.2  CL 103 104 103 103  102 104  CO2 27 26 26 27  26 24   GLUCOSE 98 103* 105* 132*  129* 105*  BUN 34* 50* 28* 41*  42* 60*  CREATININE 4.81* 6.46* 4.67* 6.35*  6.32* 7.69*  CALCIUM 8.3* 8.3* 8.4* 8.1*  8.1* 8.2*  MG 2.2 2.3 2.0 2.1 2.1  PHOS 4.9* 6.3* 4.8* 7.2*  7.2* 9.2*    GFR: Estimated Creatinine Clearance: 14.4 mL/min (Murle Otting) (by C-G formula based on SCr of 7.69 mg/dL (H)).  Liver Function Tests: Recent Labs  Lab 03/26/22 0151 03/27/22 0217 03/28/22 0320 03/29/22 0200 03/29/22 2059 03/30/22 0552  AST  27 26 27 24 21   --   ALT 12 13 14 13 12   --   ALKPHOS 105 97 96 79 71  --   BILITOT 1.3* 1.3* 1.4* 1.4* 1.6*  --   PROT 6.6 6.9 7.0 6.5 6.5  --   ALBUMIN 2.2* 2.4* 2.5* 2.4*  2.4* 2.4* 2.5*    CBG: Recent Labs  Lab 03/29/22 1151 03/29/22 1636 03/29/22 2055 03/30/22 0750 03/30/22 1156  GLUCAP 148* 129* 112* 110* 136*     No results found for this or any previous visit (from the past 240 hour(s)).       Radiology Studies: No results found.      Scheduled Meds:  (feeding supplement) PROSource Plus  30 mL Oral TID BM   sodium chloride   Intravenous Once   sodium chloride   Intravenous Once   acetaminophen  650 mg Oral Q6H   allopurinol  100 mg Oral Daily   amoxicillin-clavulanate  1 tablet Oral BID   vitamin C  1,000 mg Oral Daily   aspirin  81 mg Oral Daily   Chlorhexidine Gluconate Cloth  6 each Topical Q0600   darbepoetin (ARANESP) injection - DIALYSIS  200 mcg Intravenous Q Mon-HD   feeding supplement (NEPRO CARB STEADY)  237 mL Oral BID BM   fiber  1 packet Oral BID   folic acid  1 mg Oral Daily   guaiFENesin  400 mg Oral Q8H   lanthanum  1,000 mg Oral TID WC   leptospermum manuka honey  1 application. Topical Daily   melatonin  3 mg Oral QHS   metoprolol  tartrate  37.5 mg Oral TID   multivitamin  1 tablet Oral QHS   nutrition supplement (JUVEN)  1 packet Oral BID BM   pantoprazole  40 mg Oral Daily   QUEtiapine  50 mg Oral QHS   sodium chloride flush  3 mL Intravenous Q12H   sodium hypochlorite   Irrigation BID   Continuous Infusions:  sodium chloride Stopped (03/20/22 2050)   ceftolozane-tazobactam (ZERBAXA) IVPB 150 mg (03/30/22 0918)     LOS: 36 days    Time spent: over 30 min     Lacretia Nicks, MD [facility]   To contact the attending provider between 7A-7P or the covering provider during after hours 7P-7A, please log into the web site [email or web address] and access using universal [place] password for that web site. If you do not have the password, please call the hospital operator.  03/30/2022, 4:23 PM

## 2022-03-30 NOTE — Progress Notes (Signed)
PT Cancellation Note ? ?Patient Details ?Name: Jeffrey Ayala ?MRN: [id number] ?DOB: 1961-10-28 ? ? ?Cancelled Treatment:    Reason Eval/Treat Not Completed: Patient at procedure or test/unavailable ?Per RN, pt imminently leaving for HD dept, will continue efforts per PT plan of care as schedule permits. ? ?Kara Pacer Tryson Lumley ?03/30/2022, 11:37 AM ? ? ?

## 2022-03-30 NOTE — Procedures (Signed)
Seen and examined on dialysis. Procedure supervised.  Blood pressure 151/86 and HR 97.  Tolerating goal.  RIJ tunn catheter in use.  ? ?He got two units of PRBC's on HD today.  Given the amount of PRBC's he has received in the past 24-48 hours spoke with primary team and they are going to get a CT abdomen/pelvis without contrast to r/o a bleed  ? ?Claudia Desanctis, MD ?03/30/2022 4:29 PM ? ? ?

## 2022-03-30 NOTE — Progress Notes (Signed)
Heparin SQ held throughout this shift due to verbal order from Dr. Cyd Silence. Patient's HGB decreased. 1 unit of PRBC's administered per MD order.  ?

## 2022-03-30 NOTE — Progress Notes (Signed)
Patient ID: Jeffrey Ayala, male   DOB: Sep 19, 1961, 61 y.o.   MRN: [id number] ? ? ?2 Days Post-Op ?Subjective: ?Denies scrotal pain this morning. ? ?Objective: ?Vital signs in last 24 hours: ?Temp:  [97.9 ?F (36.6 ?C)-98.7 ?F (37.1 ?C)] 97.9 ?F (36.6 ?C) (05/08 4098) ?Pulse Rate:  [73-98] 89 (05/08 0752) ?Resp:  [13-20] 16 (05/08 0752) ?BP: (93-134)/(51-97) 127/85 (05/08 0752) ?SpO2:  [94 %-100 %] 100 % (05/08 0752) ?Weight:  [143 kg] 143 kg (05/08 0351) ? ?Intake/Output from previous day: ?05/07 0701 - 05/08 0700 ?In: 2006 [P.O.:550; I.V.:50; Blood:1103; IV Piggyback:303] ?Out: 1100 [Urine:550; Stool:550] ?Intake/Output this shift: ?No intake/output data recorded. ? ?Physical Exam:  ?General: Alert and oriented ?GU: Incision clean and intact.  Penrose drains in place with minimal drainage.  No hematoma or bleeding sites. ? ?Lab Results: ?Recent Labs  ?  03/29/22 ?0200 03/29/22 ?1458 03/29/22 ?2059  ?HGB 5.2* 6.2* 6.1*  ?HCT 16.4* 19.2* 19.2*  ? ?BMET ?Recent Labs  ?  03/29/22 ?0200 03/30/22 ?0552  ?NA 140  140 138  ?K 4.0  4.0 4.2  ?CL 103  102 104  ?CO2 '27  26 24  '$ ?GLUCOSE 132*  129* 105*  ?BUN 41*  42* 60*  ?CREATININE 6.35*  6.32* 7.69*  ?CALCIUM 8.1*  8.1* 8.2*  ? ? ? ?Studies/Results: ?No results found. ? ?Assessment/Plan: ?Scrotal infection: S/P initial debridement 4/24 and subsequent debridement and closure of wound 5/6.  Wound looks good.  No evidence of GU bleeding to explain persistent anemia.  Will leave drains and monitor with plans to remove drains later this week.  Would defer to ID as to length of antibiotic therapy but likely to benefit from at least a few more days to a week of continued therapy considering risk of developing re-infection during early healing. ? ? LOS: 36 days  ? ?[name] ?03/30/2022, 8:59 AM ? ?  ?

## 2022-03-31 ENCOUNTER — Inpatient Hospital Stay (HOSPITAL_COMMUNITY): Payer: BC Managed Care – PPO

## 2022-03-31 DIAGNOSIS — R6521 Severe sepsis with septic shock: Secondary | ICD-10-CM | POA: Diagnosis not present

## 2022-03-31 DIAGNOSIS — K7460 Unspecified cirrhosis of liver: Secondary | ICD-10-CM

## 2022-03-31 DIAGNOSIS — A419 Sepsis, unspecified organism: Secondary | ICD-10-CM | POA: Diagnosis not present

## 2022-03-31 LAB — CBC WITH DIFFERENTIAL/PLATELET
Abs Immature Granulocytes: 0.05 10*3/uL (ref 0.00–0.07)
Basophils Absolute: 0 10*3/uL (ref 0.0–0.1)
Basophils Relative: 0 %
Eosinophils Absolute: 0 10*3/uL (ref 0.0–0.5)
Eosinophils Relative: 0 %
HCT: 25.3 % — ABNORMAL LOW (ref 39.0–52.0)
Hemoglobin: 8.3 g/dL — ABNORMAL LOW (ref 13.0–17.0)
Immature Granulocytes: 1 %
Lymphocytes Relative: 13 %
Lymphs Abs: 1.2 10*3/uL (ref 0.7–4.0)
MCH: 29.2 pg (ref 26.0–34.0)
MCHC: 32.8 g/dL (ref 30.0–36.0)
MCV: 89.1 fL (ref 80.0–100.0)
Monocytes Absolute: 1 10*3/uL (ref 0.1–1.0)
Monocytes Relative: 11 %
Neutro Abs: 6.7 10*3/uL (ref 1.7–7.7)
Neutrophils Relative %: 75 %
Platelets: 188 10*3/uL (ref 150–400)
RBC: 2.84 MIL/uL — ABNORMAL LOW (ref 4.22–5.81)
RDW: 15.5 % (ref 11.5–15.5)
WBC: 8.9 10*3/uL (ref 4.0–10.5)
nRBC: 0 % (ref 0.0–0.2)

## 2022-03-31 LAB — COMPREHENSIVE METABOLIC PANEL
ALT: 13 U/L (ref 0–44)
AST: 24 U/L (ref 15–41)
Albumin: 2.3 g/dL — ABNORMAL LOW (ref 3.5–5.0)
Alkaline Phosphatase: 75 U/L (ref 38–126)
Anion gap: 8 (ref 5–15)
BUN: 28 mg/dL — ABNORMAL HIGH (ref 8–23)
CO2: 27 mmol/L (ref 22–32)
Calcium: 8 mg/dL — ABNORMAL LOW (ref 8.9–10.3)
Chloride: 103 mmol/L (ref 98–111)
Creatinine, Ser: 4.3 mg/dL — ABNORMAL HIGH (ref 0.61–1.24)
GFR, Estimated: 15 mL/min — ABNORMAL LOW (ref >60)
Glucose, Bld: 106 mg/dL — ABNORMAL HIGH (ref 70–99)
Potassium: 3.8 mmol/L (ref 3.5–5.1)
Sodium: 138 mmol/L (ref 135–145)
Total Bilirubin: 1 mg/dL (ref 0.3–1.2)
Total Protein: 6.4 g/dL — ABNORMAL LOW (ref 6.5–8.1)

## 2022-03-31 LAB — TYPE AND SCREEN
ABO/RH(D): A POS
Antibody Screen: NEGATIVE
Unit division: 0
Unit division: 0
Unit division: 0
Unit division: 0
Unit division: 0

## 2022-03-31 LAB — PHOSPHORUS: Phosphorus: 4.5 mg/dL (ref 2.5–4.6)

## 2022-03-31 LAB — BPAM RBC
Blood Product Expiration Date: 202305132359
Blood Product Expiration Date: 202305152359
Blood Product Expiration Date: 202305262359
Blood Product Expiration Date: 202305262359
Blood Product Expiration Date: 202305272359
ISSUE DATE / TIME: 202305070447
ISSUE DATE / TIME: 202305071030
ISSUE DATE / TIME: 202305072351
ISSUE DATE / TIME: 202305081415
ISSUE DATE / TIME: 202305081419
Unit Type and Rh: 6200
Unit Type and Rh: 6200
Unit Type and Rh: 6200
Unit Type and Rh: 6200
Unit Type and Rh: 6200

## 2022-03-31 LAB — GLUCOSE, CAPILLARY
Glucose-Capillary: 100 mg/dL — ABNORMAL HIGH (ref 70–99)
Glucose-Capillary: 102 mg/dL — ABNORMAL HIGH (ref 70–99)
Glucose-Capillary: 110 mg/dL — ABNORMAL HIGH (ref 70–99)
Glucose-Capillary: 128 mg/dL — ABNORMAL HIGH (ref 70–99)
Glucose-Capillary: 129 mg/dL — ABNORMAL HIGH (ref 70–99)

## 2022-03-31 LAB — MAGNESIUM: Magnesium: 2 mg/dL (ref 1.7–2.4)

## 2022-03-31 MED ORDER — CHLORHEXIDINE GLUCONATE CLOTH 2 % EX PADS
6.0000 | MEDICATED_PAD | Freq: Every day | CUTANEOUS | Status: DC
  Administered 2022-04-01 – 2022-04-03 (×3): 6 via TOPICAL

## 2022-03-31 NOTE — Progress Notes (Signed)
?PROGRESS NOTE ? ? ? ?Jeffrey Ayala  HQI:[id number] DOB: 03/25/1961 [name]: 02/22/2022 ?PCP: Patient, No Pcp Per (Inactive)  ?Chief Complaint  ?Patient presents with  ? Atrial Fibrillation  ? Nausea  ? ? ?Brief Narrative:  ?Jeffrey Ayala is Jeffrey Ayala 61 y.o. male with PMH significant for arthritis, bilateral knee replacement, gastric ulcer, kidney stones, eosinophilic esophagitis, migraines not on any regular meds.   ?Patient presented to the ED on 4/2 with complaint of nausea, vomiting, diarrhea and confusion for 24 hours.   ?EMS noted him to be in Zimir Kittleson-fib with Alonia Dibuono heart rate of 120-160 bpm. ?In the ED, he had Tiwana Chavis fever of 102.9. ?CT abdomen pelvis showed swollen scrotum concerning for infection, colon filled with diarrhea.  ?He was hypotensive requiring multiple vasopressors in the ICU.   ?4/3, patient required intubation and also had Berdell Nevitt brief cardiac arrest postintubation.   ?4/5, in an effort to correct vasoplegia, patient was also started on [name] ?Because of septic shock, patient's renal function worsened. ?4/6, patient was started on CRRT ?4/11, patient was extubated. ?4/14, patient was transferred out to [facility] service. ?4/23, underwent left transmetatarsal amputation and right BKA by [name] ?4/24, scrotum debridement and subsequent ICU readmission ?4/27, transfer to [facility] service ?4/29, scrotal wound culture with E. Faecalis and MDR Pseudomonas aeruginosa; ID consulted and patient transitioned to Ampicillin, Avycaz and Flagyl ?5/1, increased lucidity. ID transitioned antibiotics to Augmentin and Zerbaxa ?5/6 s/p I&D with urology ?5/7  transfused 2 units pRBC ?5/8  transfused 3 units pRBC, CT abd/pelvis ?5/9  working on placement.  Hb appears stable, will need continued follow up.  ? ? ?Assessment & Plan: ?  ?Principal Problem: ?  Septic shock ([name]) ?Active Problems: ?  Cardiac arrest [facility]) ?  Scrotal infection ?  Anemia ?  Gangrene of right foot ([name]) ?  Gangrene of left foot ([name]) ?  AKI (acute kidney  injury) ([name]) ?  Atrial fibrillation with RVR ([name]) ?  Altered mental status ?  Diarrhea ?  Shock liver ?  Acute respiratory failure with hypoxia ([name]) ?  Cirrhosis ([name]) ?  Gout ?  Acute pain ?  Demand ischemia ([name]) ?  Thrombocytopenia ([name]) ?  Malnutrition of moderate degree ?  Pressure injury of skin ?  Encounter for central line placement ?  Severe protein-calorie malnutrition ([name]) ?  Hypotension ?  Respiratory insufficiency ?  Cellulitis ?  Obesity, Class III, BMI 40-49.9 (morbid obesity) ([name]) ?  Fournier's gangrene in male ? ? ?Assessment and Plan: ?* Septic shock ([name]) ?Due to scrotal infection ?Required ICU admission with vasopressor support ? ?Cardiac arrest [facility]) ?"brief" cardiac arrest post intubation on 4/3 ?Echo with normal EF, no RWMA ?No additional plans for ischemic testing at this time per cards ? ? ?Anemia ?Hb seems stable today, trend ?Suspect most likely post op given fall after surgery ?Follow CT scan -> no acute intra abdominal or pelvic pathology, non obstructing bilateral renal calculi, colonic diverticulosis, no bowel obstruction, normal appendix, cirrhosis with splenomegaly and small perihepatic ascites ?Smear review -> no schistocytes, no blast, platelets ?Negative hemoccult ?Follow anemia labs - c/w aocd ?S/p 9 total units pRBC ? ?Scrotal infection ?Dry gangrene of anterior scrotum, s/p debridement on 4/24 ?S/p irrigation and debridement of scrotum with wound closure on 5/6 ?MDR pseudomonas, enterococcus faecalis, bacteroides ovalis, beta lactamase positive ?Continue zerbaxa and augmentin per ID -> recommending 7 days after last surgery.  Plan for f/u in clinic on 5/19 at 3;30 PM (  if still in the hospital, will need to reconsult iD) ?Pain management ? ? ? ? ?Gangrene of left foot ([name]) ?S/p L TMA and right BKA with [name] on 4/23 ?Per ID, no abx indicated at this time ? ?Gangrene of right foot ([name]) ?S/p L TMA and right BKA with [name] on 4/23 ?Per ID, no abx indicated at this  time ? ?AKI (acute kidney injury) ([name]) ?Thought 2/2 ATN from septic shock ?Anuric and dialysis dependent since 02/26/2022 ?Per renal ? ?Atrial fibrillation with RVR ([name]) ?New diagnosis. CHA2DS2-VASc Score is 1 (PAD).Transthoracic Echocardiogram with preserved LVEF. Mostly rate controlled but did have some associated tachycardia while agitated. Initially was managed on heparin IV which was discontinued secondary to anemia. Cardiology consulted. Patient started on metoprolol 37.5 mg TID and aspirin 81 mg.  ?-Continue metoprolol 37.5 mg TID and aspirin 81 mg daily ?- needs outpatient cardiology follow up ? ?Altered mental status ?Unsure of specific etiology. Possibly secondary to critical illness but also complicated by uremia and concern for possible hypoxemic injury while in septic shock. CT head unremarkable for acute process. Patient started on Seroquel for hopeful management with not much improvement. Patient with waxing and waning symptoms. Normal vitamin B1 and B12. Patient found to have likely smoldering infection that might have contributed to persistent mental status issues. ?-Delirium precautions ?-Continue Seroquel BID; will discontinue AM seroquel for now - follow and will deescalate/d/c as able ?-Haldol PRN ? ?Diarrhea ?Negative c diff on 4/2 ?Negative gi path panel on 4/17 ?Has rectal tube in place ?Continue imodium, will discuss with RN ? ?Shock liver ?improved ? ?Cirrhosis ([name]) ?Noted on imaging ?Follow bili, platelets, INR, albumin ?Needs outpatient GI follow up ? ?Acute respiratory failure with hypoxia ([name]) ?Patient intubated from 4/3 - 4/10, 4/23 - 4/23, 4/24 - 4/25. Resolved. ? ?Gout ?Now on allopurinol ?Colchicine has now been d/c'd ? ?Demand ischemia ([name]) ?In setting of septic shock and cardiac arrest ?Per cards, due to demand ischemia ? ?Thrombocytopenia ([name]) ?S/p 1 unit platelets ? ?Malnutrition of moderate degree ?In setting of altered mentation. Dietitian consulted. Small bore NG tube  placed on 4/17 and tube feeds initiated. Unfortunately, NG tube mistakenly removed on 4/28. SLP reevaluated on 4/29 with recommendation for dysphagia 3 diet. Currently undergoing calorie count - met caloric needs.  ?-SLP recommendations (4/29): ?  Diet recommendations: Dysphagia 3 (mechanical soft);Thin liquid ?Liquids provided via: Cup;Straw ?Medication Administration: Crushed with puree ?Supervision: Staff to assist with self feeding;Full supervision/cueing for compensatory strategies;Trained caregiver to feed patient ?Compensations: Minimize environmental distractions;Slow rate;Small sips/bites ?Postural Changes and/or Swallow Maneuvers: Seated upright 90 degrees  ? ? ? ?Pressure injury of skin ?Pressure Injury 02/22/22 Coccyx Medial Stage 2 -  Partial thickness loss of dermis presenting as Cylus Douville shallow open injury with Jaleah Lefevre red, pink wound bed without slough. pink, painful (Active)  ?02/22/22 2330  ?Location: Coccyx  ?Location Orientation: Medial  ?Staging: Stage 2 -  Partial thickness loss of dermis presenting as Margretta Zamorano shallow open injury with [place] red, pink wound bed without slough.  ?Wound Description (Comments): pink, painful  ?Present on Admission: Yes  ? ? ? ? ? ?DVT prophylaxis: SCD ?Code Status: full ?Family Communication: none ?Disposition:  ? ?Status is: Inpatient ?Remains inpatient appropriate because: need for abx, urology intervention ?  ?Consultants:  ?Urology ?ID ?PCCM ?Cards ?Nephrology ?Vascular surgery ? ?Procedures:  ?LEFT TRANSMETATARSAL AMPUTATION, RIGHT BELOW KNEE AMPUTATION (4/23) ?Debridement of scrotum (4/24) ?Echo ?IMPRESSIONS  ? ? ? 1. Left ventricular ejection fraction,  by estimation, is 60 to 65%. The  ?left ventricle has normal function. The left ventricle has no regional  ?wall motion abnormalities. There is mild left ventricular hypertrophy.  ?Left ventricular diastolic function  ?could not be evaluated.  ? 2. Right ventricular systolic function is normal. The right ventricular  ?size is  normal.  ? 3. The mitral valve is normal in structure. No evidence of mitral valve  ?regurgitation. No evidence of mitral stenosis.  ? 4. The aortic valve is tricuspid. Aortic valve regurgitation is not  ?visualized.

## 2022-03-31 NOTE — PMR Pre-admission (Signed)
PMR Admission Coordinator Pre-Admission Assessment ? ?Patient: Jeffrey Ayala is an 61 y.o., male ?MRN: [id number] ?DOB: 06/02/1961 ?Height: '5\' 10"'$  (177.8 cm) ?Weight: (!) 142.9 kg ? ?Insurance Information ?HMO:     PPO: yes     PCP:      IPA:      80/20:      OTHER:  ?PRIMARY: [place]      Policy#: [id number]      Subscriber: pt ?CM Name: Maudie Mercury      Phone#: [phone]     Fax#: [phone] ?Pre-Cert#: [id number] approved for 5 days 5/12 until 04/08/22      Employer:  ?Benefits:  Phone #: [phone]     Name: 5/9 ?Eff. Date: 08/23/2017     Deduct: $1000      Out of Pocket Max: $5000      Life Max: none ?CIR: 80%      SNF: 80% 100 days per year ?Outpatient: $35 co pay per visit     Co-Pay: 20 visits combined ?Home Health: 80%      Co-Pay:20% ?DME: 80%     Co-Pay: 20% ?Providers: in network ? ?SECONDARY: none ? ?Financial Counselor:       Phone#:  ? ?The ?Data Collection Information Summary? for patients in Inpatient Rehabilitation Facilities with attached ?Privacy Act [place] Records? was provided and verbally reviewed with: N/A ? ?Emergency Contact Information ?Contact Information   ? ? Name Relation Home Work Mobile  ? [place]  [phone]  ? ?  ? ?Current Medical History  ?Patient Admitting Diagnosis: Debility after septic shock ? ?History of Present Illness: 61 year old male with history of gastric ulcer, kidney stones, bilateral knee replacements, eosinophilic esophagitis and migraines. Presented on 02/22/22 with complaints nausea , vomiting and diarrhea.Temp 102.9 and found to be in afib with RVR on arrival via EMS. ? ?CT abdomen and pelvis showed scrotal swelling concerning for infection, colon filled with diarrhea. Hypotensive and requiring multiple vasopressors in the ICU. Required intubation and also brief cardiac arrest post intubation. Treated for septic shock. Renal function worsened and Nephrology consulted. Began on CRRT.  Underwent left  transmetatarsal amputation and right BKA by Dr Sharol Given on 4/23. Scrotal debridement on 4/24. Scrotal wound culture with E. Faecalis and MDR Pseudomonas aeruginosa. ID consulted and patient treated with IV antibiotics. Eventual transition to Augment and Zerbaxa. Additional I and D of scrotum on 5/6. Continues on IHD and OP hemodialysis has been arranged.. Nephrology feels ATN secondary to septic shock. Cardiology consulted for A Fib with RVR. Began on metoprolol and ASA. Altered mental status resolving and felt secondary to critical illness, uremia and sepsis. CT head unremarkable. Began on Seroquel.  ? ?Patient's medical record from [facility] has been reviewed by the rehabilitation admission coordinator and physician. ? ?Past Medical History  ?Past Medical History:  ?Diagnosis Date  ? Concussion   ? Eosinophilic esophagitis   ? Gastric ulcer   ? History of kidney stones   ? Migraine   ? ?Has the patient had major surgery during 100 days prior to admission? Yes ? ?Family History   ?family history includes COPD in his mother; Cancer in his brother; Sarcoidosis in his brother. ? ?Current Medications ? ?Current Facility-Administered Medications:  ?  (feeding supplement) PROSource Plus liquid 30 mL, 30 mL, Oral, TID BM, Mariel Aloe, MD, 30 mL at 04/02/22 2206 ?  0.9 %  sodium chloride infusion (Manually program via Guardrails IV Fluids), ,  Intravenous, Once, Shalhoub, Sherryll Burger, MD ?  0.9 %  sodium chloride infusion (Manually program via Guardrails IV Fluids), , Intravenous, Once, [name] C, MD ?  0.9 %  sodium chloride infusion, 250 mL, Intravenous, Continuous, Ogan, Okoronkwo U, MD, Stopping previously hung infusion at 03/20/22 2050 ?  acetaminophen (TYLENOL) tablet 325-650 mg, 325-650 mg, Oral, Q6H PRN, Mariel Aloe, MD, 650 mg at 04/01/22 2225 ?  acetaminophen (TYLENOL) tablet 650 mg, 650 mg, Oral, Q6H, Mariel Aloe, MD, 650 mg at 04/03/22 1657 ?  allopurinol (ZYLOPRIM) tablet 100 mg, 100 mg,  Oral, Daily, Mariel Aloe, MD, 100 mg at 04/02/22 9038 ?  amoxicillin-clavulanate (AUGMENTIN) 500-125 MG per tablet 500 mg, 1 tablet, Oral, BID, Vu, Trung T, MD, 500 mg at 04/02/22 2206 ?  ascorbic acid (VITAMIN C) tablet 1,000 mg, 1,000 mg, Oral, Daily, Mariel Aloe, MD, 1,000 mg at 04/02/22 3338 ?  aspirin chewable tablet 81 mg, 81 mg, Oral, Daily, Mariel Aloe, MD, 81 mg at 04/02/22 0825 ?  ceftolozane-tazobactam (ZERBAXA) 150 mg in sodium chloride 0.9 % 100 mL IVPB, 150 mg, Intravenous, Q8H, Vu, Trung T, MD, Last Rate: 101.1 mL/hr at 04/02/22 1615, 150 mg at 04/02/22 1615 ?  Chlorhexidine Gluconate Cloth 2 % PADS 6 each, 6 each, Topical, Q0600, Claudia Desanctis, MD, 6 each at 04/03/22 0645 ?  Darbepoetin Alfa (ARANESP) injection 200 mcg, 200 mcg, Intravenous, Q Mon-HD, Candee Furbish, MD, 200 mcg at 03/30/22 1552 ?  feeding supplement (NEPRO CARB STEADY) liquid 237 mL, 237 mL, Oral, BID BM, Elodia Florence., MD, 237 mL at 04/01/22 1338 ?  fiber (NUTRISOURCE FIBER) 1 packet, 1 packet, Oral, BID, Mariel Aloe, MD, 1 packet at 04/02/22 2206 ?  folic acid (FOLVITE) tablet 1 mg, 1 mg, Oral, Daily, Mariel Aloe, MD, 1 mg at 04/02/22 3291 ?  guaiFENesin (ROBITUSSIN) 100 MG/5ML liquid 400 mg, 400 mg, Oral, Q8H, Elodia Florence., MD, 400 mg at 04/02/22 1315 ?  guaiFENesin-dextromethorphan (ROBITUSSIN DM) 100-10 MG/5ML syrup 15 mL, 15 mL, Oral, Q4H PRN, Mariel Aloe, MD ?  haloperidol lactate (HALDOL) injection 2 mg, 2 mg, Intravenous, Q6H PRN, Mariel Aloe, MD, 2 mg at 03/21/22 0410 ?  heparin sodium (porcine) injection 3,800 Units, 3,800 Units, Intracatheter, PRN, [name], MD, 3,800 Units at 03/25/22 1120 ?  hydrALAZINE (APRESOLINE) injection 5 mg, 5 mg, Intravenous, Q20 Min PRN, [name], MD ?  HYDROmorphone (DILAUDID) injection 1 mg, 1 mg, Intravenous, Q3H PRN, Elodia Florence., MD, 1 mg at 03/28/22 2340 ?  hydrOXYzine (ATARAX) 10 MG/5ML syrup 25 mg, 25 mg, Oral, BID  PRN, Mariel Aloe, MD, 25 mg at 04/01/22 2226 ?  labetalol (NORMODYNE) injection 10 mg, 10 mg, Intravenous, Q10 min PRN, [name], MD ?  lanthanum Lucretia Kern) chewable tablet 1,000 mg, 1,000 mg, Oral, TID WC, Elmarie Shiley, MD, 1,000 mg at 04/02/22 1606 ?  leptospermum manuka honey (MEDIHONEY) paste 1 application., 1 application., Topical, Daily, [name], MD, 1 application. at 04/02/22 0814 ?  loperamide HCl (IMODIUM) 1 MG/7.5ML suspension 2 mg, 2 mg, Oral, PRN, Mariel Aloe, MD ?  melatonin tablet 3 mg, 3 mg, Oral, QHS, Mariel Aloe, MD, 3 mg at 04/02/22 2206 ?  metoprolol tartrate (LOPRESSOR) injection 2-5 mg, 2-5 mg, Intravenous, Q2H PRN, [name], MD ?  metoprolol tartrate (LOPRESSOR) tablet 37.5 mg, 37.5 mg, Oral, TID, Mariel Aloe, MD,  37.5 mg at 04/02/22 2206 ?  multivitamin (RENA-VIT) tablet 1 tablet, 1 tablet, Oral, QHS, Mariel Aloe, MD, 1 tablet at 04/02/22 2206 ?  nutrition supplement (JUVEN) (JUVEN) powder packet 1 packet, 1 packet, Oral, BID BM, Mariel Aloe, MD, 1 packet at 04/02/22 1315 ?  ondansetron (ZOFRAN) injection 4 mg, 4 mg, Intravenous, Q6H PRN, [name], MD, 4 mg at 03/12/22 1257 ?  oxyCODONE (Oxy IR/ROXICODONE) immediate release tablet 5 mg, 5 mg, Oral, Q4H PRN, Mariel Aloe, MD, 5 mg at 03/31/22 1044 ?  pantoprazole (PROTONIX) EC tablet 40 mg, 40 mg, Oral, Daily, Elodia Florence., MD, 40 mg at 04/02/22 0835 ?  phenol (CHLORASEPTIC) mouth spray 1 spray, 1 spray, Mouth/Throat, PRN, [name], MD ?  QUEtiapine (SEROQUEL) tablet 25 mg, 25 mg, Oral, QHS, Elodia Florence., MD, 25 mg at 04/02/22 2206 ?  sodium chloride flush (NS) 0.9 % injection 3 mL, 3 mL, Intravenous, Q12H, Candee Furbish, MD, 3 mL at 04/02/22 2214 ?  sodium chloride flush (NS) 0.9 % injection 3 mL, 3 mL, Intravenous, PRN, Candee Furbish, MD ?  sodium hypochlorite (DAKIN'S FULL STRENGTH) topical solution, , Irrigation, BID, Vira Agar, MD, Given at 04/01/22  2230 ?  white petrolatum (VASELINE) gel, , Topical, PRN, [name], MD, Given at 03/07/22 [phone] ? ?Patients Current Diet:  ?Diet Order   ? ?       ?  Diet renal with fluid restriction Fluid restriction: 1200 mL

## 2022-03-31 NOTE — Progress Notes (Signed)
Occupational Therapy Treatment ?Patient Details ?Name: Jeffrey Ayala ?MRN: [id number] ?DOB: October 16, 1961 ?Today's Date: 03/31/2022 ? ? ?History of present illness Pt adm 4/2 c/o N/V/D and found to have septic shock due to scrotal cellulitis vs RLL PNA.  Pt intubated 4/3 and brief cardiac arrest post intubation. [name] started 4/5.  Pt with AKI and started on CRRT on 4/6. Pt extubated 4/10. Due to gangrene of B LE, pt underwent R BKA and L transmet amputations on 4/23. On 4/24 and 5/6, pt underwent I&D of scrotum due to dry gangrene. Pt also with Afib with RVR. PMH - Bil TKR, migraines ?  ?OT comments ? Patient very motivated and eager to participate with therapy. Patient educated on anterior/posterior scoot transfer from bed to wheelchair. Patient limited on this date due to complaints of scrotal pain due to recent I&D and wound closure. Patient educated on wheelchair mobility and grooming at wheelchair level. Patient assisted back to bec with anterior/posterior scoot transfer due discomfort sitting at this time due to recent procedure. Patient would benefit from continued therapy services to increase independence with transfers and self care.   ? ?Recommendations for follow up therapy are one component of a multi-disciplinary discharge planning process, led by the attending physician.  Recommendations may be updated based on patient status, additional functional criteria and insurance authorization. ?   ?Follow Up Recommendations ? Acute inpatient rehab (3hours/day)  ?  ?Assistance Recommended at Discharge Frequent or constant Supervision/Assistance  ?Patient can return home with the following ? Two people to help with walking and/or transfers;Two people to help with bathing/dressing/bathroom;Assistance with cooking/housework;Assistance with feeding;Direct supervision/assist for medications management;Direct supervision/assist for financial management;Assist for transportation;Help with stairs or ramp for  entrance ?  ?Equipment Recommendations ? Wheelchair (measurements OT);Wheelchair cushion (measurements OT);Hospital bed;BSC/3in1  ?  ?Recommendations for Other Services   ? ?  ?Precautions / Restrictions Precautions ?Precautions: Fall;Other (comment) (rectal tube, catheter/drain) ?Precaution Comments: R BKA wound vac and L TMA prevena vac ?Required Braces or Orthoses: Other Brace ?Other Brace: limb guard ?Restrictions ?Weight Bearing Restrictions: Yes ?RLE Weight Bearing: Non weight bearing ?LLE Weight Bearing: Non weight bearing  ? ? ?  ? ?Mobility Bed Mobility ?Overal bed mobility: Needs Assistance ?Bed Mobility: Supine to Sit ?Rolling: Min guard ?  ?Supine to sit: Mod assist, +2 for physical assistance, +2 for safety/equipment ?  ?Sit to sidelying: Min assist, +2 for safety/equipment, +2 for physical assistance ?General bed mobility comments: pt transitioned from supine to long sitting from flat bed with mod assist and side rail, in too much pain due to groin discomfort post-op to maintain long sit so pt returned to sidelying then performed sidelying to sitting EOB with +2 modA and bed rail and better able to maintain upright in slightly backward leaning posture. ?  ? ?Transfers ?Overall transfer level: Needs assistance ?Equipment used:  (bed pads) ?Transfers: Bed to chair/wheelchair/BSC ?  ?  ?  ?  ?Anterior-Posterior transfers: Max assist, +2 physical assistance, From elevated surface ?  ?General transfer comment: pt needing +2 maxA for posterior scooting in air bed with max cues for engaging BUE to elevate hips for scooting and for improved body mechanics. Pt with improved technique once able to reach and use B arm rests of chair for final couple scoots. Pt limited due to increased groin discomfort in upright long sit posture so needing slightly increased assist today with transfer pads. On return transfer from WC>air bed, bed deflated and pt needing +2 maxA with multimodal  cues for anterior scooting with bed  pad assist. ?  ?  ?Balance Overall balance assessment: Needs assistance ?Sitting-balance support: No upper extremity supported, Feet supported, Bilateral upper extremity supported ?Sitting balance-Leahy Scale: Fair ?Sitting balance - Comments: Pt needing increased BUE support and external assist today due to pain in upright long sit posture, needed to laterally lean frequently to offload scrotal area. Needing min/modA for seated balance today. ?  ?  ?  ?Standing balance comment: N/A, pt NWB BLE ?  ?  ?  ?  ?  ?  ?  ?  ?  ?  ?  ?   ? ?ADL either performed or assessed with clinical judgement  ? ?ADL Overall ADL's : Needs assistance/impaired ?  ?  ?Grooming: Brushing hair;Set up;Sitting ?Grooming Details (indicate cue type and reason): seated in wheelchair with cues for encouragement ?  ?  ?  ?  ?  ?  ?  ?  ?  ?  ?  ?  ?  ?  ?  ?General ADL Comments: patient sat in wheelchair at sink for grooming ?  ? ?Extremity/Trunk Assessment   ?  ?  ?  ?  ?  ? ?Vision   ?  ?  ?Perception   ?  ?Praxis   ?  ? ?Cognition Arousal/Alertness: Awake/alert ?Behavior During Therapy: Flat affect, Restless, Impulsive ?Overall Cognitive Status: Impaired/Different from baseline ?Area of Impairment: Orientation, Memory, Attention, Following commands, Safety/judgement, Awareness, Problem solving ?  ?  ?  ?  ?  ?  ?  ?  ?Orientation Level: Disoriented to, Time, Situation ?Current Attention Level: Sustained ?Memory: Decreased recall of precautions, Decreased short-term memory ?Following Commands: Follows one step commands consistently, Follows one step commands with increased time, Follows multi-step commands inconsistently ?Safety/Judgement: Decreased awareness of safety, Decreased awareness of deficits ?Awareness: Intellectual ?Problem Solving: Slow processing, Decreased initiation, Difficulty sequencing, Requires verbal cues, Requires tactile cues ?General Comments: requires cues for safety. Used calendar to assist with orientation ?  ?  ?    ?Exercises   ? ?  ?Shoulder Instructions   ? ? ?  ?General Comments HR 101 bpm (Afib, varying 90's-110 bpm); SpO2 100% on RA  ? ? ?Pertinent Vitals/ Pain       Pain Assessment ?Pain Assessment: Faces ?Faces Pain Scale: Hurts even more ?Pain Location: scrotal area with long sitting position, pain improves to mild when resting/leaning to side to offload ?Pain Descriptors / Indicators: Discomfort, Grimacing, Moaning ?Pain Intervention(s): Monitored during session, RN gave pain meds during session ? ?Home Living   ?  ?  ?  ?  ?  ?  ?  ?  ?  ?  ?  ?  ?  ?  ?  ?  ?  ?  ? ?  ?Prior Functioning/Environment    ?  ?  ?  ?   ? ?Frequency ? Min 2X/week  ? ? ? ? ?  ?Progress Toward Goals ? ?OT Goals(current goals can now be found in the care plan section) ? Progress towards OT goals: Progressing toward goals ? ?Acute Rehab OT Goals ?Patient Stated Goal: go to rehab ?OT Goal Formulation: With patient ?Time For Goal Achievement: 04/01/22 ?Potential to Achieve Goals: Good ?ADL Goals ?Pt Will Perform Grooming: with min assist;sitting;bed level ?Pt Will Perform Upper Body Bathing: with mod assist;sitting ?Pt Will Transfer to Toilet: with max assist;with +2 assist;with transfer board ?Pt/caregiver will Perform Home Exercise Program: Increased strength;Both right and left upper extremity;With  written HEP provided ?Additional ADL Goal #1: Pt will attend to and complete ADL tasks > 3 min with no more than min cueing.  ?Plan Discharge plan remains appropriate;Frequency remains appropriate   ? ?Co-evaluation ? ? ? PT/OT/SLP Co-Evaluation/Treatment: Yes ?Reason for Co-Treatment: Complexity of the patient's impairments (multi-system involvement);Necessary to address cognition/behavior during functional activity;For patient/therapist safety;To address functional/ADL transfers ?PT goals addressed during session: Mobility/safety with mobility;Balance;Proper use of DME;Strengthening/ROM ?OT goals addressed during session: Strengthening/ROM ?   ? ?  ?AM-PAC OT "6 Clicks" Daily Activity     ?Outcome Measure ? ? Help from another person eating meals?: A Little ?Help from another person taking care of personal grooming?: A Little ?Help from another pers

## 2022-03-31 NOTE — Progress Notes (Signed)
Inpatient Rehabilitation Admissions Coordinator  ? ?I met with patient and wife at bedside with therapy. I will begin insurance Auth today for possible [facility] admit. They are in agreement. ? ?Danne Baxter, RN, MSN ?Rehab Admissions Coordinator ?[phone] ?03/31/2022 11:32 AM ? ?

## 2022-03-31 NOTE — Progress Notes (Signed)
Physical Therapy Treatment ?Patient Details ?Name: Jeffrey Ayala ?MRN: [id number] ?DOB: 1960/12/11 ?Today's Date: 03/31/2022 ? ? ?History of Present Illness Pt adm 4/2 c/o N/V/D and found to have septic shock due to scrotal cellulitis vs RLL PNA.  Pt intubated 4/3 and brief cardiac arrest post intubation. [name] started 4/5.  Pt with AKI and started on CRRT on 4/6. Pt extubated 4/10. Due to gangrene of B LE, pt underwent R BKA and L transmet amputations on 4/23. On 4/24 and 5/6, pt underwent I&D of scrotum due to dry gangrene. Pt also with Afib with RVR. PMH - Bil TKR, migraines ? ?  ?PT Comments  ? ? Pt received supine in air bed, agreeable to therapy session and with good participation and tolerance for anterior/posterior seated scoot transfer training and wheelchair mobility training in room. Pt limited this date with long sitting tolerance due to increased scrotal discomfort post-recent I&D and wound closure, medicated during session by RN. Pt needing max multimodal cues for safety/sequencing and proper body mechanics due to increased pain. Pt needing increased time to perform all tasks due to cognitive deficit, pain and multiple lines with wound vacs and rectal/catheter lines. Pt making good progress toward goals this date and goals updated by supervising [place] M per pt progress. Pt needing mod to maxA for wheelchair mobility with hand over hand assist for forward/backward mobility and safe hand placement on wheel rails. MD/RN notified prevena TMA vac not appearing to function properly and spouse asking for update from surgery team. Pt continues to benefit from PT services to progress toward functional mobility goals.   ?Recommendations for follow up therapy are one component of a multi-disciplinary discharge planning process, led by the attending physician.  Recommendations may be updated based on patient status, additional functional criteria and insurance authorization. ? ?Follow Up  Recommendations ? Acute inpatient rehab (3hours/day) ?  ?  ?Assistance Recommended at Discharge Frequent or constant Supervision/Assistance  ?Patient can return home with the following Two people to help with walking and/or transfers;Two people to help with bathing/dressing/bathroom;Assistance with cooking/housework;Assistance with feeding;Direct supervision/assist for medications management;Direct supervision/assist for financial management;Assist for transportation;Help with stairs or ramp for entrance ?  ?Equipment Recommendations ? Wheelchair (measurements PT);Wheelchair cushion (measurements PT);Hospital bed;Other (comment);BSC/3in1 (mechanical lift, pending progress; drop arm BSC; slide board)  ?  ?Recommendations for Other Services   ? ? ?  ?Precautions / Restrictions Precautions ?Precautions: Fall;Other (comment) (rectal tube, catheter/drain) ?Precaution Comments: R BKA wound vac and L TMA prevena vac (Dr Sharol Given notified post-session L TMA wound vac does not appear to be functioning properly) ?Required Braces or Orthoses: Other Brace ?Other Brace: limb guard ?Restrictions ?Weight Bearing Restrictions: Yes ?RLE Weight Bearing: Non weight bearing ?LLE Weight Bearing: Non weight bearing  ?  ? ?Mobility ? Bed Mobility ?Overal bed mobility: Needs Assistance ?Bed Mobility: Supine to Sit ?Rolling: Min guard ?  ?Supine to sit: Mod assist, +2 for physical assistance, +2 for safety/equipment ?  ?Sit to sidelying: Min assist, +2 for safety/equipment, +2 for physical assistance ?General bed mobility comments: pt transitioned from supine to long sitting from flat bed with mod assist and side rail, in too much pain due to groin discomfort post-op to maintain long sit so pt returned to sidelying then performed sidelying to sitting EOB with +2 modA and bed rail and better able to maintain upright in slightly backward leaning posture. ?  ? ?Transfers ?Overall transfer level: Needs assistance ?Equipment used:  (bed  pads) ?Transfers: Bed  to chair/wheelchair/BSC ?  ?  ?  ?  ?Anterior-Posterior transfers: Max assist, +2 physical assistance, From elevated surface ?  ?General transfer comment: pt needing +2 maxA for posterior scooting in air bed with max cues for engaging BUE to elevate hips for scooting and for improved body mechanics. Pt with improved technique once able to reach and use B arm rests of chair for final couple scoots. Pt limited due to increased groin discomfort in upright long sit posture so needing slightly increased assist today with transfer pads. On return transfer from WC>air bed, bed deflated and pt needing +2 maxA with multimodal cues for anterior scooting with bed pad assist. ?  ? ?  ? ? ?Wheelchair Mobility ?Wheelchair Mobility ?Wheelchair mobility: Yes ?Wheelchair propulsion: Both upper extremities ?Wheelchair parts: Needs assistance ?Distance: 20 ?Wheelchair Assistance Details (indicate cue type and reason): Pt needing frequent multimodal cues for proper UE placement, body mechanics ? ? ?  ?Balance Overall balance assessment: Needs assistance ?Sitting-balance support: No upper extremity supported, Feet supported, Bilateral upper extremity supported ?Sitting balance-Leahy Scale: Fair ?Sitting balance - Comments: Pt needing increased BUE support and external assist today due to pain in upright long sit posture, needed to laterally lean frequently to offload scrotal area. Needing min/modA for seated balance today. ?  ?  ?  ?Standing balance comment: N/A, pt NWB BLE ?  ?  ?  ?  ?  ?  ?  ?  ?  ?  ?  ?  ? ?  ?Cognition Arousal/Alertness: Awake/alert ?Behavior During Therapy: Flat affect, Restless, Impulsive ?Overall Cognitive Status: Impaired/Different from baseline ?Area of Impairment: Orientation, Memory, Attention, Following commands, Safety/judgement, Awareness, Problem solving ?  ?  ?  ?  ?  ?  ?  ?  ?Orientation Level: Disoriented to, Time, Situation ?Current Attention Level: Sustained ?Memory:  Decreased recall of precautions, Decreased short-term memory ?Following Commands: Follows one step commands consistently, Follows one step commands with increased time, Follows multi-step commands inconsistently ?Safety/Judgement: Decreased awareness of safety, Decreased awareness of deficits ?Awareness: Intellectual ?Problem Solving: Slow processing, Decreased initiation, Difficulty sequencing, Requires verbal cues, Requires tactile cues ?General Comments: Pt alert and engaging with therapists today. He is able to state month/year with hints and looking up at calendar in room as well as day of week but needing increased time and reminder of question to respond. Follows 1 step commands briefly but poor awareness to safety, deficits and problem solving. Pt tending to forget postural/safety cues within session and needs frequent reinforcement for safe body mechanics. ?  ?  ? ?  ?Exercises Amputee Exercises ?Quad Sets: AROM, 5 reps, Both, Supine ?Hip ABduction/ADduction: AROM, Both, 5 reps, Supine ?Straight Leg Raises: AROM, Both, 5 reps, Supine ?Chair Push Up: AROM, 10 reps, Seated, Both (with breaks between, pushing up while repositioning in wheelchair due to scrotal discomfort) ?Other Exercises ?Other Exercises: lateral leans with elbow taps x5 reps each direction (min to modA for stability) ? ?  ?General Comments General comments (skin integrity, edema, etc.): HR 101 bpm (Afib, varying 90's-110 bpm); SpO2 100% on RA ?  ?  ? ?Pertinent Vitals/Pain Pain Assessment ?Pain Assessment: Faces ?Faces Pain Scale: Hurts even more ?Pain Location: scrotal area with long sitting position, pain improves to mild when resting/leaning to side to offload ?Pain Descriptors / Indicators: Discomfort, Grimacing, Moaning ?Pain Intervention(s): Monitored during session, Repositioned, RN gave pain meds during session  ? ? ? ?PT Goals (current goals can now be found in the care plan  section) Acute Rehab PT Goals ?Patient Stated Goal: To get  stronger in hospital and rehab then to go home ?PT Goal Formulation: With patient/family ?Time For Goal Achievement: 04/14/22 ?Additional Goals ?Additional Goal #1: Pt will propel w/c 100' with min guard assist on level surface ?Progre

## 2022-03-31 NOTE — Progress Notes (Signed)
Patient ID: Jeffrey Ayala, male   DOB: 1961/02/02, 61 y.o.   MRN: [id number] ?[facility] ?Progress Note  ? ?Assessment/ Plan:   ?1. Acute kidney Injury: ATN from severe septic shock in the setting of foot wounds.  Has been dialysis dependent since 02/26/2022 (initially started with CRRT and transition to IHD with first tx 4/12).   ?- HD per MWF schedule ?- monitor for renal recovery however given prolonged dependence unsure of likelihood for recovery  ?- Will obtain vein mapping; he and his wife are going to talk about the AVF ?- We are clipping AKI for now and watch for recovery, though may be end stage  ?- re-ordered strict ins/outs and spoke with nursing. ?- in light of renal failure would reduce augmentin to daily dosing if ok with team ? ?2.  Scrotal edema/wound: Status post surgical debridement by urology on 4/24 and on 5/6 underwent irrigation and debridement of scrotum with wound closure and drain placement.  Abx per primary team  ?- as above, in light of renal failure would reduce augmentin to daily dosing if ok with team ?- noted elevated bladder scan volumes ?- post void residual bladder scan and would in/out cath if over 300 mL ? ?3.  Leg wound status post left TMA and right below-knee amputation on 03/15/2022: Septic shock resolved and remains on antimicrobial coverage. ? ?4.  Anemia - acute blood loss: s/p PRBC's x 2 units on 5/7 (maybe 3 per nursing) and 2 units PRBC's with HD on 5/8 as well.  PRBC's on 5/8 as well.  Spoke with team on 5/9. CT abd was negative for acute process.  Hb improved on 5/8 and 5/9 s/p the PRBC's ? ?5.  Hyperphosphatemia: Secondary to acute kidney injury.  On binders and HD.  Note Switched from sevelamer to Fosrenol.  Improved control ? ?Disposition - still needs an outpatient HD unit.  Not able to be discharged without that plan in place.  spoke with HD SW on 5/8 to start clip as AKI ? ?Subjective:   ?Last HD on 5/8 with 2 kg UF.  He had no UOP charted over 5/8.   Had CT a/p in the interim which was negative for acute process. His wife states that he did make urine yesterday and today.   His wife was on HD prior to a renal transplant.  She shared with me that she would prefer [facility] and I asked her to let HD SW know.  They understand the need for an AVF if he continues to need HD.  They want to see what his urine output is which is reasonable.  They're ok with vein mapping  ? ?Review of systems:  ?Denies shortness of breath or chest pain  ?Denies n/v ?  ? ?Objective:   ?BP 134/81 (BP Location: Right Arm)   Pulse 92   Temp 98.3 ?F (36.8 ?C) (Oral)   Resp 16   Ht '5\' 10"'$  (1.778 m)   Wt (!) 142.9 kg   SpO2 98%   BMI 45.20 kg/m?  ? ?Intake/Output Summary (Last 24 hours) at 03/31/2022 0857 ?Last data filed at 03/30/2022 1650 ?Gross per 24 hour  ?Intake 315 ml  ?Output 2000 ml  ?Net -1685 ml  ? ?Weight change: -0.117 kg ? ?Physical Exam:     ?General adult male in bed in no acute distress ?HEENT normocephalic atraumatic extraocular movements intact sclera anicteric ?Neck supple trachea midline ?Lungs clear to auscultation bilaterally normal work of breathing  at rest  ?Heart S1S2 no rub ?Abdomen soft nontender nondistended ?Ext: Status post left TMA and right BKA   ?Psych normal mood and affect ?GU - condom catheter  ?Access: RIJ tunn catheter ? ?Imaging: ?CT RENAL STONE STUDY ? ?Result Date: 03/31/2022 ?CLINICAL DATA:  Anemia. EXAM: CT ABDOMEN AND PELVIS WITHOUT CONTRAST TECHNIQUE: Multidetector CT imaging of the abdomen and pelvis was performed following the standard protocol without IV contrast. RADIATION DOSE REDUCTION: This exam was performed according to the departmental dose-optimization program which includes automated exposure control, adjustment of the mA and/or kV according to patient size and/or use of iterative reconstruction technique. COMPARISON:  CT abdomen pelvis dated 02/28/2022. FINDINGS: Evaluation of this exam is limited in the absence of  intravenous contrast. Lower chest: The visualized lung bases are clear. No intra-abdominal free air.  Small perihepatic ascites. Hepatobiliary: Slight irregularity of the liver contour suspicious for early changes of cirrhosis. Clinical correlation is recommended. No intrahepatic biliary dilatation. The gallbladder is unremarkable. Pancreas: Unremarkable. No pancreatic ductal dilatation or surrounding inflammatory changes. Spleen: Splenomegaly measuring 20 cm in length. Adrenals/Urinary Tract: The adrenal glands are unremarkable. Multiple nonobstructing bilateral renal calculi measure up to 1 cm. There is no hydronephrosis on either side. The visualized ureters and urinary bladder appear unremarkable. Stomach/Bowel: A rectal tube is in place. There are scattered sigmoid diverticula without active inflammatory changes. There is no bowel obstruction or active inflammation. The appendix is normal. Vascular/Lymphatic: Mild aortoiliac atherosclerotic disease. The IVC is unremarkable. No portal venous gas. There is no adenopathy. Reproductive: The prostate and seminal vesicles are grossly unremarkable. No pelvic mass. Other: Mild diffuse subcutaneous edema.  No fluid collection. Musculoskeletal: Degenerative changes of the spine. No acute osseous pathology. IMPRESSION: 1. No acute intra-abdominal or pelvic pathology. 2. Multiple nonobstructing bilateral renal calculi. No hydronephrosis. 3. Colonic diverticulosis. No bowel obstruction. Normal appendix. 4. Cirrhosis with splenomegaly and small perihepatic ascites. 5. Aortic Atherosclerosis (ICD10-I70.0). Electronically Signed   By: Anner Crete M.D.   On: 03/31/2022 03:38   ? ?Labs: ?BMET ?Recent Labs  ?Lab 03/25/22 ?0158 03/26/22 ?0151 03/27/22 ?0217 03/28/22 ?0320 03/29/22 ?0200 03/30/22 ?4782 03/31/22 ?0123  ?NA 138 139 140 139 140  140 138 138  ?K 3.9 3.6 3.8 3.7 4.0  4.0 4.2 3.8  ?CL 102 103 104 103 103  102 104 103  ?CO2 '22 27 26 26 27  26 24 27  '$ ?GLUCOSE 107*  98 103* 105* 132*  129* 105* 106*  ?BUN 66* 34* 50* 28* 41*  42* 60* 28*  ?CREATININE 7.08* 4.81* 6.46* 4.67* 6.35*  6.32* 7.69* 4.30*  ?CALCIUM 8.4* 8.3* 8.3* 8.4* 8.1*  8.1* 8.2* 8.0*  ?PHOS 7.4* 4.9* 6.3* 4.8* 7.2*  7.2* 9.2* 4.5  ? ?CBC ?Recent Labs  ?Lab 03/28/22 ?0320 03/29/22 ?0200 03/29/22 ?1458 03/29/22 ?2059 03/30/22 ?1156 03/30/22 ?1853 03/31/22 ?0123  ?WBC 6.1 7.6  --  6.7 7.5  --  8.9  ?NEUTROABS 4.4 5.2  --  4.7  --   --  6.7  ?HGB 7.7* 5.2*   < > 6.1* 6.8* 8.9* 8.3*  ?HCT 24.5* 16.4*   < > 19.2* 21.0* 25.7* 25.3*  ?MCV 92.8 93.7  --  91.9 90.1  --  89.1  ?PLT 255 229  --  187 187  --  188  ? < > = values in this interval not displayed.  ? ? ?Medications:   ? ? (feeding supplement) PROSource Plus  30 mL Oral TID BM  ? sodium chloride  Intravenous Once  ? sodium chloride   Intravenous Once  ? acetaminophen  650 mg Oral Q6H  ? allopurinol  100 mg Oral Daily  ? amoxicillin-clavulanate  1 tablet Oral BID  ? vitamin C  1,000 mg Oral Daily  ? aspirin  81 mg Oral Daily  ? Chlorhexidine Gluconate Cloth  6 each Topical Q0600  ? darbepoetin (ARANESP) injection - DIALYSIS  200 mcg Intravenous Q Mon-HD  ? feeding supplement (NEPRO CARB STEADY)  237 mL Oral BID BM  ? fiber  1 packet Oral BID  ? folic acid  1 mg Oral Daily  ? guaiFENesin  400 mg Oral Q8H  ? lanthanum  1,000 mg Oral TID WC  ? leptospermum manuka honey  1 application. Topical Daily  ? melatonin  3 mg Oral QHS  ? metoprolol tartrate  37.5 mg Oral TID  ? multivitamin  1 tablet Oral QHS  ? nutrition supplement (JUVEN)  1 packet Oral BID BM  ? pantoprazole  40 mg Oral Daily  ? QUEtiapine  25 mg Oral QHS  ? sodium chloride flush  3 mL Intravenous Q12H  ? sodium hypochlorite   Irrigation BID  ? ?Claudia Desanctis, MD ?03/31/2022, 9:39 AM ? ? ? ?

## 2022-03-31 NOTE — Progress Notes (Addendum)
Attempted to meet with pt and pt's wife this morning but pt with other staff at that time. Will attempt to f/u with pt a little later today to discuss out-pt HD needs.  ? ?Jeffrey Ayala ?Renal Navigator ?[phone] ? ?Addendum at 12:09 pm: ?Attempted to reach pt and pt's wife via phone but have been unsuccessful. Will continue efforts.  ? ?Addendum at 1:30 pm: ?Spoke to pt's wife via phone very briefly. Wife will return call to navigator later this afternoon. ? ?Addendum at 4:30 pm: ?Spoke to pt's wife via phone. Introduced self and explained role. Wife prefers [employer] for pt's out-pt HD at d/c. Referral made to [facility] admissions this afternoon. Pt's wife plans to transport pt to/from HD appts. Will follow and assist with out-pt HD needs.  ?

## 2022-03-31 NOTE — Assessment & Plan Note (Signed)
Noted on imaging ?Follow bili, platelets, INR, albumin ?Needs outpatient GI follow up ?

## 2022-03-31 NOTE — Progress Notes (Deleted)
?PROGRESS NOTE ? ? ? ?ANANT GOODRIDGE  EXB:[id number] DOB: [birth year] [name]: 02/22/2022 ?PCP: Patient, No Pcp Per (Inactive)  ?Chief Complaint  ?Patient presents with  ? Atrial Fibrillation  ? Nausea  ? ? ?Brief Narrative:  ?CINCERE GIVAN is Khan Chura 61 y.o. male with PMH significant for arthritis, bilateral knee replacement, gastric ulcer, kidney stones, eosinophilic esophagitis, migraines not on any regular meds.   ?Patient presented to the ED on 4/2 with complaint of nausea, vomiting, diarrhea and confusion for 24 hours.   ?EMS noted him to be in Oluwadamilola Rosamond-fib with Kyrie Fludd heart rate of 120-160 bpm. ?In the ED, he had Nawaal Alling fever of 102.9. ?CT abdomen pelvis showed swollen scrotum concerning for infection, colon filled with diarrhea.  ?He was hypotensive requiring multiple vasopressors in the ICU.   ?4/3, patient required intubation and also had Madison Albea brief cardiac arrest postintubation.   ?4/5, in an effort to correct vasoplegia, patient was also started on [name] ?Because of septic shock, patient's renal function worsened. ?4/6, patient was started on CRRT ?4/11, patient was extubated. ?4/14, patient was transferred out to [facility] service. ?4/23, underwent left transmetatarsal amputation and right BKA by [name] ?4/24, scrotum debridement and subsequent ICU readmission ?4/27, transfer to [facility] service ?4/29, scrotal wound culture with E. Faecalis and MDR Pseudomonas aeruginosa; ID consulted and patient transitioned to Ampicillin, Avycaz and Flagyl ?5/1, increased lucidity. ID transitioned antibiotics to Augmentin and Zerbaxa ?5/6 s/p I&D with urology ?5/7  transfused 2 units pRBC ?5/8  transfused 3 units pRBC, CT abd/pelvis.  Plan for abx for 7 days after last surgery. ?5/9  working on placement.  Hb appears stable, will need continued follow up.  ? ? ?Assessment & Plan: ?  ?Principal Problem: ?  Septic shock ([name]) ?Active Problems: ?  Cardiac arrest [facility]) ?  Scrotal infection ?  Anemia ?  Gangrene of right foot ([name]) ?   Gangrene of left foot ([name]) ?  AKI (acute kidney injury) ([name]) ?  Atrial fibrillation with RVR ([name]) ?  Altered mental status ?  Diarrhea ?  Shock liver ?  Acute respiratory failure with hypoxia ([name]) ?  Cirrhosis ([name]) ?  Gout ?  Acute pain ?  Demand ischemia ([name]) ?  Thrombocytopenia ([name]) ?  Malnutrition of moderate degree ?  Pressure injury of skin ?  Encounter for central line placement ?  Severe protein-calorie malnutrition ([name]) ?  Hypotension ?  Respiratory insufficiency ?  Cellulitis ?  Obesity, Class III, BMI 40-49.9 (morbid obesity) ([name]) ?  Fournier's gangrene in male ? ? ?Assessment and Plan: ?* Septic shock ([name]) ?Due to scrotal infection ?Required ICU admission with vasopressor support ? ?Cardiac arrest [facility]) ?"brief" cardiac arrest post intubation on 4/3 ?Echo with normal EF, no RWMA ?No additional plans for ischemic testing at this time per cards ? ? ?Anemia ?Hb seems stable today, trend ?Suspect most likely post op given fall after surgery ?Follow CT scan -> no acute intra abdominal or pelvic pathology, non obstructing bilateral renal calculi, colonic diverticulosis, no bowel obstruction, normal appendix, cirrhosis with splenomegaly and small perihepatic ascites ?Smear review -> no schistocytes, no blast, platelets ?Negative hemoccult ?Follow anemia labs - c/w aocd ?S/p 9 total units pRBC ? ?Scrotal infection ?Dry gangrene of anterior scrotum, s/p debridement on 4/24 ?S/p irrigation and debridement of scrotum with wound closure on 5/6 ?MDR pseudomonas, enterococcus faecalis, bacteroides ovalis, beta lactamase positive ?Continue zerbaxa and augmentin per ID -> recommending 7 days after last surgery.  Plan for f/u in clinic on 5/19 at 3;30 PM (if still in the hospital, will need to reconsult iD) ?Pain management ? ? ? ? ?Gangrene of left foot ([name]) ?S/p L TMA and right BKA with [name] on 4/23 ?Per ID, no abx indicated at this time ? ?Gangrene of right foot ([name]) ?S/p L TMA and right BKA with Dr.  [name] on 4/23 ?Per ID, no abx indicated at this time ? ?AKI (acute kidney injury) ([name]) ?Thought 2/2 ATN from septic shock ?Anuric and dialysis dependent since 02/26/2022 ?Per renal ? ?Atrial fibrillation with RVR ([name]) ?New diagnosis. CHA2DS2-VASc Score is 1 (PAD).Transthoracic Echocardiogram with preserved LVEF. Mostly rate controlled but did have some associated tachycardia while agitated. Initially was managed on heparin IV which was discontinued secondary to anemia. Cardiology consulted. Patient started on metoprolol 37.5 mg TID and aspirin 81 mg.  ?-Continue metoprolol 37.5 mg TID and aspirin 81 mg daily ?- needs outpatient cardiology follow up ? ?Altered mental status ?Unsure of specific etiology. Possibly secondary to critical illness but also complicated by uremia and concern for possible hypoxemic injury while in septic shock. CT head unremarkable for acute process. Patient started on Seroquel for hopeful management with not much improvement. Patient with waxing and waning symptoms. Normal vitamin B1 and B12. Patient found to have likely smoldering infection that might have contributed to persistent mental status issues. ?-Delirium precautions ?-Continue Seroquel BID; will discontinue AM seroquel for now - follow and will deescalate/d/c as able ?-Haldol PRN ? ?Diarrhea ?Negative c diff on 4/2 ?Negative gi path panel on 4/17 ?Has rectal tube in place ?Continue imodium, will discuss with RN ? ?Shock liver ?improved ? ?Cirrhosis ([name]) ?Noted on imaging ?Follow bili, platelets, INR, albumin ?Needs outpatient GI follow up ? ?Acute respiratory failure with hypoxia ([name]) ?Patient intubated from 4/3 - 4/10, 4/23 - 4/23, 4/24 - 4/25. Resolved. ? ?Gout ?Now on allopurinol ?Colchicine has now been d/c'd ? ?Demand ischemia ([name]) ?In setting of septic shock and cardiac arrest ?Per cards, due to demand ischemia ? ?Thrombocytopenia ([name]) ?S/p 1 unit platelets ? ?Malnutrition of moderate degree ?In setting of altered  mentation. Dietitian consulted. Small bore NG tube placed on 4/17 and tube feeds initiated. Unfortunately, NG tube mistakenly removed on 4/28. SLP reevaluated on 4/29 with recommendation for dysphagia 3 diet. Currently undergoing calorie count - met caloric needs.  ?-SLP recommendations (4/29): ?  Diet recommendations: Dysphagia 3 (mechanical soft);Thin liquid ?Liquids provided via: Cup;Straw ?Medication Administration: Crushed with puree ?Supervision: Staff to assist with self feeding;Full supervision/cueing for compensatory strategies;Trained caregiver to feed patient ?Compensations: Minimize environmental distractions;Slow rate;Small sips/bites ?Postural Changes and/or Swallow Maneuvers: Seated upright 90 degrees  ? ? ? ?Pressure injury of skin ?Pressure Injury 02/22/22 Coccyx Medial Stage 2 -  Partial thickness loss of dermis presenting as Brianne Maina shallow open injury with Grayton Lobo red, pink wound bed without slough. pink, painful (Active)  ?02/22/22 2330  ?Location: Coccyx  ?Location Orientation: Medial  ?Staging: Stage 2 -  Partial thickness loss of dermis presenting as Linnie Mcglocklin shallow open injury with Alaysha Jefcoat red, pink wound bed without slough.  ?Wound Description (Comments): pink, painful  ?Present on Admission: Yes  ? ? ? ? ? ?DVT prophylaxis: SCD ?Code Status: full ?Family Communication: none ?Disposition:  ? ?Status is: Inpatient ?Remains inpatient appropriate because: need for abx, urology intervention ?  ?Consultants:  ?Urology ?ID ?PCCM ?Cards ?Nephrology ?Vascular surgery ? ?Procedures:  ?LEFT TRANSMETATARSAL AMPUTATION, RIGHT BELOW KNEE AMPUTATION (4/23) ?Debridement of scrotum (4/24) ?Echo ?  IMPRESSIONS  ? ? ? 1. Left ventricular ejection fraction, by estimation, is 60 to 65%. The  ?left ventricle has normal function. The left ventricle has no regional  ?wall motion abnormalities. There is mild left ventricular hypertrophy.  ?Left ventricular diastolic function  ?could not be evaluated.  ? 2. Right ventricular systolic  function is normal. The right ventricular  ?size is normal.  ? 3. The mitral valve is normal in structure. No evidence of mitral valve  ?regurgitation. No evidence of mitral stenosis.  ? 4. The aortic valve is tricuspid. Esequiel Kleinfelter

## 2022-04-01 DIAGNOSIS — R6521 Severe sepsis with septic shock: Secondary | ICD-10-CM | POA: Diagnosis not present

## 2022-04-01 DIAGNOSIS — A419 Sepsis, unspecified organism: Secondary | ICD-10-CM | POA: Diagnosis not present

## 2022-04-01 LAB — GLUCOSE, CAPILLARY
Glucose-Capillary: 105 mg/dL — ABNORMAL HIGH (ref 70–99)
Glucose-Capillary: 99 mg/dL (ref 70–99)
Glucose-Capillary: 93 mg/dL (ref 70–99)
Glucose-Capillary: 162 mg/dL — ABNORMAL HIGH (ref 70–99)
Glucose-Capillary: 170 mg/dL — ABNORMAL HIGH (ref 70–99)

## 2022-04-01 LAB — CBC WITH DIFFERENTIAL/PLATELET
Abs Immature Granulocytes: 0.07 10*3/uL (ref 0.00–0.07)
Basophils Absolute: 0 10*3/uL (ref 0.0–0.1)
Basophils Relative: 0 %
Eosinophils Absolute: 0 10*3/uL (ref 0.0–0.5)
Eosinophils Relative: 0 %
HCT: 25.9 % — ABNORMAL LOW (ref 39.0–52.0)
Hemoglobin: 8.3 g/dL — ABNORMAL LOW (ref 13.0–17.0)
Immature Granulocytes: 1 %
Lymphocytes Relative: 12 %
Lymphs Abs: 1 10*3/uL (ref 0.7–4.0)
MCH: 29.5 pg (ref 26.0–34.0)
MCHC: 32 g/dL (ref 30.0–36.0)
MCV: 92.2 fL (ref 80.0–100.0)
Monocytes Absolute: 1 10*3/uL (ref 0.1–1.0)
Monocytes Relative: 12 %
Neutro Abs: 6.3 10*3/uL (ref 1.7–7.7)
Neutrophils Relative %: 75 %
Platelets: 210 10*3/uL (ref 150–400)
RBC: 2.81 MIL/uL — ABNORMAL LOW (ref 4.22–5.81)
RDW: 15.6 % — ABNORMAL HIGH (ref 11.5–15.5)
WBC: 8.4 10*3/uL (ref 4.0–10.5)
nRBC: 0 % (ref 0.0–0.2)

## 2022-04-01 LAB — PHOSPHORUS: Phosphorus: 5.5 mg/dL — ABNORMAL HIGH (ref 2.5–4.6)

## 2022-04-01 LAB — COMPREHENSIVE METABOLIC PANEL
ALT: 12 U/L (ref 0–44)
AST: 26 U/L (ref 15–41)
Albumin: 2.5 g/dL — ABNORMAL LOW (ref 3.5–5.0)
Alkaline Phosphatase: 81 U/L (ref 38–126)
Anion gap: 11 (ref 5–15)
BUN: 43 mg/dL — ABNORMAL HIGH (ref 8–23)
CO2: 25 mmol/L (ref 22–32)
Calcium: 8.4 mg/dL — ABNORMAL LOW (ref 8.9–10.3)
Chloride: 103 mmol/L (ref 98–111)
Creatinine, Ser: 6.51 mg/dL — ABNORMAL HIGH (ref 0.61–1.24)
GFR, Estimated: 9 mL/min — ABNORMAL LOW (ref >60)
Glucose, Bld: 106 mg/dL — ABNORMAL HIGH (ref 70–99)
Potassium: 3.9 mmol/L (ref 3.5–5.1)
Sodium: 139 mmol/L (ref 135–145)
Total Bilirubin: 1.5 mg/dL — ABNORMAL HIGH (ref 0.3–1.2)
Total Protein: 6.7 g/dL (ref 6.5–8.1)

## 2022-04-01 LAB — PROTIME-INR
INR: 1.2 (ref 0.8–1.2)
Prothrombin Time: 15.5 seconds — ABNORMAL HIGH (ref 11.4–15.2)

## 2022-04-01 LAB — MAGNESIUM: Magnesium: 2.1 mg/dL (ref 1.7–2.4)

## 2022-04-01 MED ORDER — HEPARIN SODIUM (PORCINE) 1000 UNIT/ML IJ SOLN
INTRAMUSCULAR | Status: AC
  Filled 2022-04-01: qty 5

## 2022-04-01 NOTE — Plan of Care (Signed)

## 2022-04-01 NOTE — Progress Notes (Signed)
Orthopedic Tech Progress Note ?Patient Details:  ?[name] ?September 07, 1961 ?[id number] ? ?Called in order to [facility] for [place]. 3XL  for the RLE TRANSTIBIAL, and 2XL for the LLE TRANSMETATARSAL ? ? Patient ID: Jeffrey Ayala, male   DOB: 02/07/1961, 61 y.o.   MRN: [id number] ? ?Janit Pagan ?04/01/2022, 8:17 AM ? ?

## 2022-04-01 NOTE — Progress Notes (Signed)
Inpatient Rehabilitation Admissions Coordinator  ? ?I await insurance approval for possible [facility] admit. ? ?Danne Baxter, RN, MSN ?Rehab Admissions Coordinator ?[phone] ?04/01/2022 12:17 PM ? ?

## 2022-04-01 NOTE — Progress Notes (Addendum)
Inpatient Rehabilitation Admissions Coordinator  ? ?I have received insurance approval for Cir but no bed available today. I contacted pt's wife and she is aware. I will let the team know when bed is available over the next 24 hrs. I notified Melven Sartorius, with Nephrology of CIR approval. ? ?Danne Baxter, RN, MSN ?Rehab Admissions Coordinator ?([phone] ?04/01/2022 3:16 PM ? ?

## 2022-04-01 NOTE — Progress Notes (Signed)
Patient ID: LE FAULCON, male   DOB: 07-Aug-1961, 61 y.o.   MRN: [id number] ?Patient is 2 weeks status post left transmetatarsal amputation and right transtibial amputation.  The Prevena wound VAC has automatically stopped working at 14 days.  The Praveena VAC dressing was removed the incision is well consolidated no cellulitis no drainage no odor.  The wound VAC dressing is removed from the right transtibial amputation.  The incision is well consolidated no drainage no cellulitis no dehiscence.  I will order a 3 XL shrinker for the right transtibial amputation and a 2 XL shrinker for the left transmetatarsal amputation. ?

## 2022-04-01 NOTE — Progress Notes (Signed)
Patient ID: Jeffrey Ayala, male   DOB: 30-Jul-1961, 61 y.o.   MRN: [id number] ?[facility] ?Progress Note  ? ?Assessment/ Plan:   ?1. Acute kidney Injury: ATN from severe septic shock in the setting of foot wounds.  Has been dialysis dependent since 02/26/2022 (initially started with CRRT and transition to IHD with first tx 4/12).   ?- HD per MWF schedule ?- have ordered vein mapping - not yet done he and his wife are going to talk about the AVF and he states they haven't really talked  yet ?- We are clipping as AKI for now and watching for recovery, though may be end stage given prolonged dependence on dialysis  ?- strict ins/outs  ?- in light of renal failure would reduce augmentin to daily dosing if ok with team ? ?2.  Scrotal edema/wound: Status post surgical debridement by urology on 4/24 and on 5/6 underwent irrigation and debridement of scrotum with wound closure and drain placement.  Abx per primary team  ?- as above, in light of renal failure would reduce augmentin to daily dosing if ok with team ?- noted elevated bladder scan volumes though last scanned at 111 mL   ?- post void residual bladder scan and would in/out cath if over 300 mL ? ?3.  Leg wound status post left TMA and right below-knee amputation on 03/15/2022: Septic shock resolved and remains on antimicrobial coverage. ? ?4.  Anemia - acute blood loss: s/p PRBC's x 2 units on 5/7 (maybe 3 per nursing) and 2 units PRBC's with HD on 5/8 as well.  PRBC's on 5/8 as well.  Spoke with team on 5/9. CT abd was negative for acute process.  Hb improved on 5/8 and 5/9 s/p the PRBC's.  On aranesp 200 mcg weekly on mondays  ? ?5.  Hyperphosphatemia: Secondary to acute kidney injury.  On binders and HD.  Note Switched from sevelamer to Fosrenol.  Improved control ? ?Disposition - still needs an outpatient HD unit.  Not able to be discharged without that plan in place.  spoke with HD SW on 5/8 to start clip as AKI ? ?Subjective:   ?He had 775 mL UOP  over 5/9 as well as one unmeasured urine void.   He doesn't recall an in/out cath.  Elevated bladder scans ranging from 411 to 358 to 247 and 111.  He states he and his wife didn't yet really talk about the AVF.  ? ?Review of systems:   ?Denies shortness of breath or chest pain  ?Denies n/v ?  ? ?Objective:   ?BP (!) 131/93   Pulse 78   Temp 98.4 ?F (36.9 ?C) (Oral)   Resp (!) 21   Ht '5\' 10"'$  (1.778 m)   Wt (!) 142.9 kg   SpO2 98%   BMI 45.20 kg/m?  ? ?Intake/Output Summary (Last 24 hours) at 04/01/2022 1010 ?Last data filed at 04/01/2022 0329 ?Gross per 24 hour  ?Intake 795 ml  ?Output 775 ml  ?Net 20 ml  ? ?Weight change: 0 kg ? ?Physical Exam:      ?General adult male in bed in no acute distress ?HEENT normocephalic atraumatic extraocular movements intact sclera anicteric ?Neck supple trachea midline ?Lungs clear to auscultation bilaterally normal work of breathing at rest  ?Heart S1S2 no rub ?Abdomen soft nontender nondistended ?Ext: Status post left TMA and right BKA   ?Psych normal mood and affect ?GU - no condom catheter currently  ?Access: RIJ tunn catheter ? ?Imaging: ?  CT RENAL STONE STUDY ? ?Result Date: 03/31/2022 ?CLINICAL DATA:  Anemia. EXAM: CT ABDOMEN AND PELVIS WITHOUT CONTRAST TECHNIQUE: Multidetector CT imaging of the abdomen and pelvis was performed following the standard protocol without IV contrast. RADIATION DOSE REDUCTION: This exam was performed according to the departmental dose-optimization program which includes automated exposure control, adjustment of the mA and/or kV according to patient size and/or use of iterative reconstruction technique. COMPARISON:  CT abdomen pelvis dated 02/28/2022. FINDINGS: Evaluation of this exam is limited in the absence of intravenous contrast. Lower chest: The visualized lung bases are clear. No intra-abdominal free air.  Small perihepatic ascites. Hepatobiliary: Slight irregularity of the liver contour suspicious for early changes of cirrhosis. Clinical  correlation is recommended. No intrahepatic biliary dilatation. The gallbladder is unremarkable. Pancreas: Unremarkable. No pancreatic ductal dilatation or surrounding inflammatory changes. Spleen: Splenomegaly measuring 20 cm in length. Adrenals/Urinary Tract: The adrenal glands are unremarkable. Multiple nonobstructing bilateral renal calculi measure up to 1 cm. There is no hydronephrosis on either side. The visualized ureters and urinary bladder appear unremarkable. Stomach/Bowel: A rectal tube is in place. There are scattered sigmoid diverticula without active inflammatory changes. There is no bowel obstruction or active inflammation. The appendix is normal. Vascular/Lymphatic: Mild aortoiliac atherosclerotic disease. The IVC is unremarkable. No portal venous gas. There is no adenopathy. Reproductive: The prostate and seminal vesicles are grossly unremarkable. No pelvic mass. Other: Mild diffuse subcutaneous edema.  No fluid collection. Musculoskeletal: Degenerative changes of the spine. No acute osseous pathology. IMPRESSION: 1. No acute intra-abdominal or pelvic pathology. 2. Multiple nonobstructing bilateral renal calculi. No hydronephrosis. 3. Colonic diverticulosis. No bowel obstruction. Normal appendix. 4. Cirrhosis with splenomegaly and small perihepatic ascites. 5. Aortic Atherosclerosis (ICD10-I70.0). Electronically Signed   By: Anner Crete M.D.   On: 03/31/2022 03:38   ? ?Labs: ?BMET ?Recent Labs  ?Lab 03/26/22 ?0151 03/27/22 ?0217 03/28/22 ?0320 03/29/22 ?0200 03/30/22 ?4782 03/31/22 ?0123 04/01/22 ?0300  ?NA 139 140 139 140  140 138 138 139  ?K 3.6 3.8 3.7 4.0  4.0 4.2 3.8 3.9  ?CL 103 104 103 103  102 104 103 103  ?CO2 '27 26 26 27  26 24 27 25  '$ ?GLUCOSE 98 103* 105* 132*  129* 105* 106* 106*  ?BUN 34* 50* 28* 41*  42* 60* 28* 43*  ?CREATININE 4.81* 6.46* 4.67* 6.35*  6.32* 7.69* 4.30* 6.51*  ?CALCIUM 8.3* 8.3* 8.4* 8.1*  8.1* 8.2* 8.0* 8.4*  ?PHOS 4.9* 6.3* 4.8* 7.2*  7.2* 9.2* 4.5  5.5*  ? ?CBC ?Recent Labs  ?Lab 03/29/22 ?0200 03/29/22 ?1458 03/29/22 ?2059 03/30/22 ?1156 03/30/22 ?1853 03/31/22 ?0123 04/01/22 ?0300  ?WBC 7.6  --  6.7 7.5  --  8.9 8.4  ?NEUTROABS 5.2  --  4.7  --   --  6.7 6.3  ?HGB 5.2*   < > 6.1* 6.8* 8.9* 8.3* 8.3*  ?HCT 16.4*   < > 19.2* 21.0* 25.7* 25.3* 25.9*  ?MCV 93.7  --  91.9 90.1  --  89.1 92.2  ?PLT 229  --  187 187  --  188 210  ? < > = values in this interval not displayed.  ? ? ?Medications:   ? ? (feeding supplement) PROSource Plus  30 mL Oral TID BM  ? sodium chloride   Intravenous Once  ? sodium chloride   Intravenous Once  ? acetaminophen  650 mg Oral Q6H  ? allopurinol  100 mg Oral Daily  ? amoxicillin-clavulanate  1 tablet Oral BID  ?  vitamin C  1,000 mg Oral Daily  ? aspirin  81 mg Oral Daily  ? Chlorhexidine Gluconate Cloth  6 each Topical Q0600  ? darbepoetin (ARANESP) injection - DIALYSIS  200 mcg Intravenous Q Mon-HD  ? feeding supplement (NEPRO CARB STEADY)  237 mL Oral BID BM  ? fiber  1 packet Oral BID  ? folic acid  1 mg Oral Daily  ? guaiFENesin  400 mg Oral Q8H  ? lanthanum  1,000 mg Oral TID WC  ? leptospermum manuka honey  1 application. Topical Daily  ? melatonin  3 mg Oral QHS  ? metoprolol tartrate  37.5 mg Oral TID  ? multivitamin  1 tablet Oral QHS  ? nutrition supplement (JUVEN)  1 packet Oral BID BM  ? pantoprazole  40 mg Oral Daily  ? QUEtiapine  25 mg Oral QHS  ? sodium chloride flush  3 mL Intravenous Q12H  ? sodium hypochlorite   Irrigation BID  ? ?Claudia Desanctis, MD ?04/01/2022, 10:10 AM ? ? ? ?

## 2022-04-01 NOTE — Progress Notes (Signed)
Patient ID: Jeffrey Ayala, male   DOB: 1961/09/26, 61 y.o.   MRN: [id number] ? ?4 Days Post-Op ?Subjective: ?Denies new pain symptoms. ? ?Objective: ?Vital signs in last 24 hours: ?Temp:  [97.7 ?F (36.5 ?C)-98.9 ?F (37.2 ?C)] 98.4 ?F (36.9 ?C) (05/10 0329) ?Pulse Rate:  [78-105] 78 (05/10 0329) ?Resp:  [15-20] 16 (05/10 0329) ?BP: (107-156)/(63-88) 140/71 (05/10 0329) ?SpO2:  [97 %-100 %] 98 % (05/10 0329) ?Weight:  [142.9 kg] 142.9 kg (05/10 0244) ? ?Intake/Output from previous day: ?05/09 0701 - 05/10 0700 ?In: 855 [P.O.:350; IV [id number]:010] ?Out: 775 [Urine:775] ?Intake/Output this shift: ?No intake/output data recorded. ? ?Physical Exam:  ?General: Alert and oriented ?GU: Scrotal incision clean and dry.  Drains removed this morning ? ?Lab Results: ?Recent Labs  ?  03/30/22 ?1853 03/31/22 ?0123 04/01/22 ?0300  ?HGB 8.9* 8.3* 8.3*  ?HCT 25.7* 25.3* 25.9*  ? ?BMET ?Recent Labs  ?  03/31/22 ?0123 04/01/22 ?0300  ?NA 138 139  ?K 3.8 3.9  ?CL 103 103  ?CO2 27 25  ?GLUCOSE 106* 106*  ?BUN 28* 43*  ?CREATININE 4.30* 6.51*  ?CALCIUM 8.0* 8.4*  ? ? ? ?Studies/Results: ?CT RENAL STONE STUDY ? ?Result Date: 03/31/2022 ?CLINICAL DATA:  Anemia. EXAM: CT ABDOMEN AND PELVIS WITHOUT CONTRAST TECHNIQUE: Multidetector CT imaging of the abdomen and pelvis was performed following the standard protocol without IV contrast. RADIATION DOSE REDUCTION: This exam was performed according to the departmental dose-optimization program which includes automated exposure control, adjustment of the mA and/or kV according to patient size and/or use of iterative reconstruction technique. COMPARISON:  CT abdomen pelvis dated 02/28/2022. FINDINGS: Evaluation of this exam is limited in the absence of intravenous contrast. Lower chest: The visualized lung bases are clear. No intra-abdominal free air.  Small perihepatic ascites. Hepatobiliary: Slight irregularity of the liver contour suspicious for early changes of cirrhosis. Clinical correlation is  recommended. No intrahepatic biliary dilatation. The gallbladder is unremarkable. Pancreas: Unremarkable. No pancreatic ductal dilatation or surrounding inflammatory changes. Spleen: Splenomegaly measuring 20 cm in length. Adrenals/Urinary Tract: The adrenal glands are unremarkable. Multiple nonobstructing bilateral renal calculi measure up to 1 cm. There is no hydronephrosis on either side. The visualized ureters and urinary bladder appear unremarkable. Stomach/Bowel: A rectal tube is in place. There are scattered sigmoid diverticula without active inflammatory changes. There is no bowel obstruction or active inflammation. The appendix is normal. Vascular/Lymphatic: Mild aortoiliac atherosclerotic disease. The IVC is unremarkable. No portal venous gas. There is no adenopathy. Reproductive: The prostate and seminal vesicles are grossly unremarkable. No pelvic mass. Other: Mild diffuse subcutaneous edema.  No fluid collection. Musculoskeletal: Degenerative changes of the spine. No acute osseous pathology. IMPRESSION: 1. No acute intra-abdominal or pelvic pathology. 2. Multiple nonobstructing bilateral renal calculi. No hydronephrosis. 3. Colonic diverticulosis. No bowel obstruction. Normal appendix. 4. Cirrhosis with splenomegaly and small perihepatic ascites. 5. Aortic Atherosclerosis (ICD10-I70.0). Electronically Signed   By: Anner Crete M.D.   On: 03/31/2022 03:38   ? ?Assessment/Plan: ?Scrotal infection: S/P initial debridement 4/24 and subsequent debridement and closure of wound 5/6.  Wound looks good.  Drains removed this morning. Defer to ID as to length of antibiotic therapy. ?  ? ? LOS: 38 days  ? ?[name] ?04/01/2022, 7:32 AM ? ?  ?

## 2022-04-01 NOTE — Progress Notes (Signed)
?PROGRESS NOTE ? ?Jeffrey Ayala OAC:[id number] DOB: May 15, 1961 [name]: 02/22/2022 ?PCP: Patient, No Pcp Per (Inactive) ? ? LOS: 38 days  ? ?Brief Narrative / Interim history: ?Jeffrey Ayala is a 61 y.o. male with PMH significant for arthritis, bilateral knee replacement, gastric ulcer, kidney stones, eosinophilic esophagitis, migraines not on any regular meds.  Patient presented to the ED on 4/2 with complaint of nausea, vomiting, diarrhea and confusion for 24 hours.  EMS noted him to be in A-fib with a heart rate of 120-160 bpm. In the ED, he had a fever of 102.9. CT abdomen pelvis showed swollen scrotum concerning for infection, colon filled with diarrhea. He was hypotensive requiring multiple vasopressors in the ICU.   ? ?Hospital events ?4/3, patient required intubation and also had a brief cardiac arrest postintubation.   ?4/5, in an effort to correct vasoplegia, patient was also started on [name] ?Because of septic shock, patient's renal function worsened. ?4/6, patient was started on CRRT ?4/11, patient was extubated. ?4/14, patient was transferred out to [facility] service. ?4/23, underwent left transmetatarsal amputation and right BKA by [name] ?4/24, scrotum debridement and subsequent ICU readmission ?4/27, transfer to [facility] service ?4/29, scrotal wound culture with E. Faecalis and MDR Pseudomonas aeruginosa; ID consulted and patient transitioned to Ampicillin, Avycaz and Flagyl ?5/1, ID transitioned antibiotics to Augmentin and Zerbaxa ?5/6, I&D of scrotum with wound closure ? ?Subjective / 24h Interval events: ?Seen in dialysis, doing well.  Not much pain.  Awaiting CIR ? ?Assesement and Plan: ?Principal Problem: ?  Septic shock ([place]) ?Active Problems: ?  Cardiac arrest [facility]) ?  Scrotal infection ?  Anemia ?  Gangrene of right foot ([place]) ?  Gangrene of left foot ([place]) ?  AKI (acute kidney injury) ([place]) ?  Atrial fibrillation with RVR ([place]) ?  Altered mental status ?  Diarrhea ?  Shock liver ?   Acute respiratory failure with hypoxia ([place]) ?  Cirrhosis ([place]) ?  Gout ?  Acute pain ?  Demand ischemia ([place]) ?  Thrombocytopenia ([place]) ?  Malnutrition of moderate degree ?  Pressure injury of skin ?  Encounter for central line placement ?  Severe protein-calorie malnutrition ([place]) ?  Hypotension ?  Respiratory insufficiency ?  Cellulitis ?  Obesity, Class III, BMI 40-49.9 (morbid obesity) ([place]) ?  Fournier's gangrene in male ? ? ?Principal problem ?Septic shock ([place]) -she required ICU admission and vasopressor support.  Shock was thought to be due to scrotal infection, urology consulted and followed patient while hospitalized, underwent several surgical interventions last 1 on 5/6 by urology, and had perineal/scrotal area primarily closed.  He has been maintained on multiple antibiotics, currently on Zerbaxa and Augmentin, with plans to end on 5/13 per ID.  Culture data showing MDR pseudomonas, enterococcus faecalis, bacteroides ovalis, beta lactamase positive.  Recommendations were for 7 days following most recent surgical intervention. ? ?Active problems ?Gangrene of the left foot-status post left by [name] on 4/23. ? ?Gangrene of the right foot-status post right BKA with Dr. [name] on 4/20 ? ?Cardiac arrest [facility]) -"brief" cardiac arrest post intubation on 4/3. Echo with normal EF, no RWMA.  Cardiology consulted and evaluated patient, no further testing required at this point ? ?Anemia, acute blood loss anemia, postoperatively-status post 9 units total of packed red blood cells.  No bleeding.  Hemoglobin stable ?  ?AKI (acute kidney injury) ([place]) -Thought 2/2 ATN from septic shock.  Currently dialysis dependent, nephrology following ? ?Atrial fibrillation with  RVR ([place]) -New diagnosis. CHA2DS2-VASc Score is 1 (PAD).Transthoracic Echocardiogram with preserved LVEF. Mostly rate controlled but did have some associated tachycardia while agitated. Initially was managed on heparin IV which was discontinued secondary  to anemia. Cardiology consulted. Patient started on metoprolol 37.5 mg TID and aspirin 81 mg.  Continue current regimen at this time ?  ?Acute metabolic encephalopathy -Unsure of specific etiology.  Possibly multifactorial in the setting of septic shock, uremia.  Much improved ? ?Shock liver -improved, LFTs have normalized ?  ?Acute respiratory failure with hypoxia [facility]) -Patient intubated from 4/3 - 4/10, 4/23 - 4/23, 4/24 - 4/25. Resolved. ?  ?Gout -Now on allopurinol ?  ?Elevated troponin due to demand ischemia [facility]) -In setting of septic shock and cardiac arrest.  Cardiology followed ?  ?Thrombocytopenia ([place]) -S/p 1 unit platelets.  Platelets are now normalized ?  ?Malnutrition of moderate degree -In setting of altered mentation. Dietitian consulted. ? ?Scheduled Meds: ? (feeding supplement) PROSource Plus  30 mL Oral TID BM  ? sodium chloride   Intravenous Once  ? sodium chloride   Intravenous Once  ? acetaminophen  650 mg Oral Q6H  ? allopurinol  100 mg Oral Daily  ? amoxicillin-clavulanate  1 tablet Oral BID  ? vitamin C  1,000 mg Oral Daily  ? aspirin  81 mg Oral Daily  ? Chlorhexidine Gluconate Cloth  6 each Topical Q0600  ? darbepoetin (ARANESP) injection - DIALYSIS  200 mcg Intravenous Q Mon-HD  ? feeding supplement (NEPRO CARB STEADY)  237 mL Oral BID BM  ? fiber  1 packet Oral BID  ? folic acid  1 mg Oral Daily  ? guaiFENesin  400 mg Oral Q8H  ? lanthanum  1,000 mg Oral TID WC  ? leptospermum manuka honey  1 application. Topical Daily  ? melatonin  3 mg Oral QHS  ? metoprolol tartrate  37.5 mg Oral TID  ? multivitamin  1 tablet Oral QHS  ? nutrition supplement (JUVEN)  1 packet Oral BID BM  ? pantoprazole  40 mg Oral Daily  ? QUEtiapine  25 mg Oral QHS  ? sodium chloride flush  3 mL Intravenous Q12H  ? sodium hypochlorite   Irrigation BID  ? ?Continuous Infusions: ? sodium chloride Stopped (03/20/22 2050)  ? ceftolozane-tazobactam (ZERBAXA) IVPB 150 mg (04/01/22 0329)  ? ?PRN Meds:.acetaminophen,  guaiFENesin-dextromethorphan, haloperidol lactate, heparin sodium (porcine), hydrALAZINE, HYDROmorphone (DILAUDID) injection, hydrOXYzine, labetalol, loperamide HCl, metoprolol tartrate, ondansetron (ZOFRAN) IV, oxyCODONE, phenol, sodium chloride flush, white petrolatum ? ?Diet Orders (From admission, onward)  ? ?  Start     Ordered  ? 03/28/22 1529  Diet renal with fluid restriction Fluid restriction: 1200 mL Fluid; Room service appropriate? Yes; Fluid consistency: Thin  Diet effective now       ?Question Answer Comment  ?Fluid restriction: 1200 mL Fluid   ?Room service appropriate? Yes   ?Fluid consistency: Thin   ?  ? 03/28/22 1528  ? ?  ?  ? ?  ? ? ?DVT prophylaxis: Place and maintain sequential compression device Start: 03/30/22 1122 ?SCD's Start: 03/15/22 1008 ?SCDs Start: 02/22/22 2249 ? ? ?Lab Results  ?Component Value Date  ? PLT 210 04/01/2022  ? ? ?  Code Status: Full Code ? ?Family Communication: no family at bedside  ? ?Status is: Inpatient ? ?Remains inpatient appropriate because: placement ? ? ?Level of care: Progressive ? ?Consultants:  ?PCCM ?Cardiology ?Orthopedic surgery ?Nephrology ?Urology ? ?Objective: ?Vitals:  ? 04/01/22 0909 04/01/22 0912 04/01/22  7680 04/01/22 8811  ?BP: (!) 143/77  (!) 158/81 (!) 131/93  ?Pulse:      ?Resp: (!) 21 18 (!) 24 (!) 21  ?Temp:      ?TempSrc:      ?SpO2:      ?Weight:      ?Height:      ? ? ?Intake/Output Summary (Last 24 hours) at 04/01/2022 1201 ?Last data filed at 04/01/2022 0329 ?Gross per 24 hour  ?Intake 795 ml  ?Output 525 ml  ?Net 270 ml  ? ?Wt Readings from Last 3 Encounters:  ?04/01/22 (!) 142.9 kg  ?02/12/20 110.2 kg  ?01/02/20 107.5 kg  ? ? ?Examination: ? ?Constitutional: NAD ?Eyes: no scleral icterus ?ENMT: Mucous membranes are moist.  ?Neck: normal, supple ?Respiratory: clear to auscultation bilaterally, no wheezing, no crackles. Normal respiratory effort. No accessory muscle use.  ?Cardiovascular: Regular rate and rhythm, no murmurs / rubs /  gallops. No LE edema.  ?Abdomen: non distended, no tenderness. Bowel sounds positive.  ?Musculoskeletal: no clubbing / cyanosis.  ?Skin: no rashes ?Neurologic: No focal deficits ? ? ?Data Reviewed: I have indepen

## 2022-04-02 ENCOUNTER — Inpatient Hospital Stay (HOSPITAL_COMMUNITY): Payer: BC Managed Care – PPO

## 2022-04-02 DIAGNOSIS — Z992 Dependence on renal dialysis: Secondary | ICD-10-CM | POA: Insufficient documentation

## 2022-04-02 DIAGNOSIS — A419 Sepsis, unspecified organism: Secondary | ICD-10-CM | POA: Diagnosis not present

## 2022-04-02 DIAGNOSIS — Z89511 Acquired absence of right leg below knee: Secondary | ICD-10-CM | POA: Insufficient documentation

## 2022-04-02 DIAGNOSIS — I252 Old myocardial infarction: Secondary | ICD-10-CM | POA: Insufficient documentation

## 2022-04-02 DIAGNOSIS — R6521 Severe sepsis with septic shock: Secondary | ICD-10-CM | POA: Diagnosis not present

## 2022-04-02 LAB — RENAL FUNCTION PANEL
Albumin: 2.3 g/dL — ABNORMAL LOW (ref 3.5–5.0)
Anion gap: 9 (ref 5–15)
BUN: 30 mg/dL — ABNORMAL HIGH (ref 8–23)
CO2: 27 mmol/L (ref 22–32)
Calcium: 8.3 mg/dL — ABNORMAL LOW (ref 8.9–10.3)
Chloride: 103 mmol/L (ref 98–111)
Creatinine, Ser: 4.88 mg/dL — ABNORMAL HIGH (ref 0.61–1.24)
GFR, Estimated: 13 mL/min — ABNORMAL LOW
Glucose, Bld: 113 mg/dL — ABNORMAL HIGH (ref 70–99)
Phosphorus: 4.2 mg/dL (ref 2.5–4.6)
Potassium: 3.8 mmol/L (ref 3.5–5.1)
Sodium: 139 mmol/L (ref 135–145)

## 2022-04-02 LAB — GLUCOSE, CAPILLARY
Glucose-Capillary: 124 mg/dL — ABNORMAL HIGH (ref 70–99)
Glucose-Capillary: 146 mg/dL — ABNORMAL HIGH (ref 70–99)
Glucose-Capillary: 135 mg/dL — ABNORMAL HIGH (ref 70–99)
Glucose-Capillary: 144 mg/dL — ABNORMAL HIGH (ref 70–99)

## 2022-04-02 NOTE — Progress Notes (Signed)
Light bleeding noted from scrotum.  Unsure about using Dakin's solution since the wound is closed.  I have covered the wound with an ABD pad and mesh underwear. ?

## 2022-04-02 NOTE — Progress Notes (Signed)
Orthopedic Tech Progress Note ?Patient Details:  ?LEHI PHIFER ?November 29, 1960 ?[id number] ?Patient had socks form [employer] delivered yesterday. Socks are in patients room ?Patient ID: JGUADALUPE OPIELA, male   DOB: [birth year], 61 y.o.   MRN: [id number] ? ?Halton Neas A Anavictoria Wilk ?04/02/2022, 8:24 AM ? ?

## 2022-04-02 NOTE — Progress Notes (Signed)
Inpatient Rehabilitation Admissions Coordinator  ? ?CIR bed is available to admit him either Friday or Saturday. I met with patient and his wife at bedside and they are in agreement. I will follow up tomorrow. ? ?Danne Baxter, RN, MSN ?Rehab Admissions Coordinator ?([phone] ?04/02/2022 11:37 AM ? ?

## 2022-04-02 NOTE — Progress Notes (Signed)
Upper extremity venous mapping attempted. Patient receiving RN care per transport. Will attempt again as schedule and patient availability permits.  ? ?Darlin Coco, RDMS, RVT ? ?

## 2022-04-02 NOTE — Progress Notes (Signed)
?PROGRESS NOTE ? ?Jeffrey Ayala PFY:[id number] DOB: [birth year] [name]: 02/22/2022 ?PCP: Patient, No Pcp Per (Inactive) ? ? LOS: 39 days  ? ?Brief Narrative / Interim history: ?Jeffrey Ayala is a 61 y.o. male with PMH significant for arthritis, bilateral knee replacement, gastric ulcer, kidney stones, eosinophilic esophagitis, migraines not on any regular meds.  Patient presented to the ED on 4/2 with complaint of nausea, vomiting, diarrhea and confusion for 24 hours.  EMS noted him to be in A-fib with a heart rate of 120-160 bpm. In the ED, he had a fever of 102.9. CT abdomen pelvis showed swollen scrotum concerning for infection, colon filled with diarrhea. He was hypotensive requiring multiple vasopressors in the ICU.   ? ?Hospital events ?4/3, patient required intubation and also had a brief cardiac arrest postintubation.   ?4/5, in an effort to correct vasoplegia, patient was also started on [name] ?Because of septic shock, patient's renal function worsened. ?4/6, patient was started on CRRT ?4/11, patient was extubated. ?4/14, patient was transferred out to [facility] service. ?4/23, underwent left transmetatarsal amputation and right BKA by [name] ?4/24, scrotum debridement and subsequent ICU readmission ?4/27, transfer to [facility] service ?4/29, scrotal wound culture with E. Faecalis and MDR Pseudomonas aeruginosa; ID consulted and patient transitioned to Ampicillin, Avycaz and Flagyl ?5/1, ID transitioned antibiotics to Augmentin and Zerbaxa ?5/6, I&D of scrotum with wound closure ? ?Subjective / 24h Interval events: ?No complaints.  Awaiting CIR ? ?Assesement and Plan: ?Principal Problem: ?  Septic shock ([place]) ?Active Problems: ?  Cardiac arrest [facility]) ?  Scrotal infection ?  Anemia ?  Gangrene of right foot ([place]) ?  Gangrene of left foot ([place]) ?  AKI (acute kidney injury) ([place]) ?  Atrial fibrillation with RVR ([place]) ?  Altered mental status ?  Diarrhea ?  Shock liver ?  Acute respiratory failure with  hypoxia ([place]) ?  Cirrhosis ([place]) ?  Gout ?  Acute pain ?  Demand ischemia ([place]) ?  Thrombocytopenia ([place]) ?  Malnutrition of moderate degree ?  Pressure injury of skin ?  Encounter for central line placement ?  Severe protein-calorie malnutrition ([place]) ?  Hypotension ?  Respiratory insufficiency ?  Cellulitis ?  Obesity, Class III, BMI 40-49.9 (morbid obesity) ([place]) ?  Fournier's gangrene in male ? ? ?Principal problem ?Septic shock ([place]) -she required ICU admission and vasopressor support.  Shock was thought to be due to scrotal infection, urology consulted and followed patient while hospitalized, underwent several surgical interventions last 1 on 5/6 by urology, and had perineal/scrotal area primarily closed.  He has been maintained on multiple antibiotics, currently on Zerbaxa and Augmentin, with plans to end on 5/13 per ID.  Culture data showing MDR pseudomonas, enterococcus faecalis, bacteroides ovalis, beta lactamase positive.  Recommendations were for 7 days following most recent surgical intervention. ? ?Active problems ?Gangrene of the left foot-status post left by [name] on 4/23. ? ?Gangrene of the right foot-status post right BKA with Dr. [name] on 4/20 ? ?Cardiac arrest [facility]) -"brief" cardiac arrest post intubation on 4/3. Echo with normal EF, no RWMA.  Cardiology consulted and evaluated patient, no further testing required at this point ? ?Anemia, acute blood loss anemia, postoperatively-status post 9 units total of packed red blood cells.  No bleeding.  Hemoglobin stable ?  ?AKI (acute kidney injury) ([place]) -Thought 2/2 ATN from septic shock.  Currently dialysis dependent, nephrology following ? ?Atrial fibrillation with RVR ([place]) -New diagnosis. CHA2DS2-VASc Score is  1 (PAD).Transthoracic Echocardiogram with preserved LVEF. Mostly rate controlled but did have some associated tachycardia while agitated. Initially was managed on heparin IV which was discontinued secondary to anemia. Cardiology consulted.  Patient started on metoprolol 37.5 mg TID and aspirin 81 mg.  Continue current regimen at this time ?  ?Acute metabolic encephalopathy -Unsure of specific etiology.  Possibly multifactorial in the setting of septic shock, uremia.  Much improved ? ?Shock liver -improved, LFTs have normalized ?  ?Acute respiratory failure with hypoxia [facility]) -Patient intubated from 4/3 - 4/10, 4/23 - 4/23, 4/24 - 4/25. Resolved. ?  ?Gout -Now on allopurinol ?  ?Elevated troponin due to demand ischemia [facility]) -In setting of septic shock and cardiac arrest.  Cardiology followed ?  ?Thrombocytopenia ([place]) -S/p 1 unit platelets.  Platelets are now normalized ?  ?Malnutrition of moderate degree -In setting of altered mentation. Dietitian consulted. ? ?Scheduled Meds: ? (feeding supplement) PROSource Plus  30 mL Oral TID BM  ? sodium chloride   Intravenous Once  ? sodium chloride   Intravenous Once  ? acetaminophen  650 mg Oral Q6H  ? allopurinol  100 mg Oral Daily  ? amoxicillin-clavulanate  1 tablet Oral BID  ? vitamin C  1,000 mg Oral Daily  ? aspirin  81 mg Oral Daily  ? Chlorhexidine Gluconate Cloth  6 each Topical Q0600  ? darbepoetin (ARANESP) injection - DIALYSIS  200 mcg Intravenous Q Mon-HD  ? feeding supplement (NEPRO CARB STEADY)  237 mL Oral BID BM  ? fiber  1 packet Oral BID  ? folic acid  1 mg Oral Daily  ? guaiFENesin  400 mg Oral Q8H  ? lanthanum  1,000 mg Oral TID WC  ? leptospermum manuka honey  1 application. Topical Daily  ? melatonin  3 mg Oral QHS  ? metoprolol tartrate  37.5 mg Oral TID  ? multivitamin  1 tablet Oral QHS  ? nutrition supplement (JUVEN)  1 packet Oral BID BM  ? pantoprazole  40 mg Oral Daily  ? QUEtiapine  25 mg Oral QHS  ? sodium chloride flush  3 mL Intravenous Q12H  ? sodium hypochlorite   Irrigation BID  ? ?Continuous Infusions: ? sodium chloride Stopped (03/20/22 2050)  ? ceftolozane-tazobactam (ZERBAXA) IVPB 150 mg (04/02/22 1056)  ? ?PRN Meds:.acetaminophen, guaiFENesin-dextromethorphan,  haloperidol lactate, heparin sodium (porcine), hydrALAZINE, HYDROmorphone (DILAUDID) injection, hydrOXYzine, labetalol, loperamide HCl, metoprolol tartrate, ondansetron (ZOFRAN) IV, oxyCODONE, phenol, sodium chloride flush, white petrolatum ? ?Diet Orders (From admission, onward)  ? ?  Start     Ordered  ? 03/28/22 1529  Diet renal with fluid restriction Fluid restriction: 1200 mL Fluid; Room service appropriate? Yes; Fluid consistency: Thin  Diet effective now       ?Question Answer Comment  ?Fluid restriction: 1200 mL Fluid   ?Room service appropriate? Yes   ?Fluid consistency: Thin   ?  ? 03/28/22 1528  ? ?  ?  ? ?  ? ? ?DVT prophylaxis: Place and maintain sequential compression device Start: 03/30/22 1122 ?SCD's Start: 03/15/22 1008 ?SCDs Start: 02/22/22 2249 ? ? ?Lab Results  ?Component Value Date  ? PLT 210 04/01/2022  ? ? ?  Code Status: Full Code ? ?Family Communication: no family at bedside  ? ?Status is: Inpatient ? ?Remains inpatient appropriate because: Awaiting CIR placement ? ? ?Level of care: Progressive ? ?Consultants:  ?PCCM ?Cardiology ?Orthopedic surgery ?Nephrology ?Urology ? ?Objective: ?Vitals:  ? 04/01/22 2353 04/02/22 0409 04/02/22 0411 04/02/22 0825  ?BP:  118/63 138/88  (!) 149/93  ?Pulse: 84 87  90  ?Resp: '20 17  18  '$ ?Temp: 98.6 ?F (37 ?C) 98.7 ?F (37.1 ?C)  98.5 ?F (36.9 ?C)  ?TempSrc: Oral Oral  Oral  ?SpO2: 100% 95%    ?Weight:   (!) 142.9 kg   ?Height:      ? ? ?Intake/Output Summary (Last 24 hours) at 04/02/2022 1106 ?Last data filed at 04/02/2022 0900 ?Gross per 24 hour  ?Intake 493 ml  ?Output 3309 ml  ?Net -2816 ml  ? ? ?Wt Readings from Last 3 Encounters:  ?04/02/22 (!) 142.9 kg  ?02/12/20 110.2 kg  ?01/02/20 107.5 kg  ? ? ?Examination: ? ?Constitutional: nad ?Respiratory: CTA, no wheezing ?Cardiovascular: rrr ? ?Data Reviewed: I have independently reviewed following labs and imaging studies  ? ?CBC ?Recent Labs  ?Lab 03/28/22 ?0320 03/29/22 ?0200 03/29/22 ?1458 03/29/22 ?2059  03/30/22 ?1156 03/30/22 ?1853 03/31/22 ?0123 04/01/22 ?0300  ?WBC 6.1 7.6  --  6.7 7.5  --  8.9 8.4  ?HGB 7.7* 5.2*   < > 6.1* 6.8* 8.9* 8.3* 8.3*  ?HCT 24.5* 16.4*   < > 19.2* 21.0* 25.7* 25.3* 25.9*  ?PLT 255 22

## 2022-04-02 NOTE — Progress Notes (Signed)
Patient ID: Jeffrey Ayala, male   DOB: [birth year], 61 y.o.   MRN: [id number] ?Patient seen as follow-up for transtibial amputation.  Patient denies any pain the incision is clean and dry.  Orders have been placed for a new stump shrinker for the right and left lower extremities. ?

## 2022-04-02 NOTE — Progress Notes (Signed)
Called on call urologist, Dr. Milford Cage in regards to using Dakin's.  He was in agreement to leave off the Dakin's and just cover the wound in ABD pad.   ? ?He advised to consult Drs. Dorann Ou when they round. ?

## 2022-04-02 NOTE — Progress Notes (Signed)
Physical Therapy Treatment ?Patient Details ?Name: Jeffrey Ayala ?MRN: [id number] ?DOB: Apr 13, 1961 ?Today's Date: 04/02/2022 ? ? ?History of Present Illness Pt adm 4/2 c/o N/V/D and found to have septic shock due to scrotal cellulitis vs RLL PNA.  Pt intubated 4/3 and brief cardiac arrest post intubation. [name] started 4/5.  Pt with AKI and started on CRRT on 4/6. Pt extubated 4/10. Due to gangrene of B LE, pt underwent R BKA and L transmet amputations on 4/23. On 4/24 and 5/6, pt underwent I&D of scrotum due to dry gangrene. Also had Afib with RVR during admission. PMH - Bil TKR, migraines. ? ?  ?PT Comments  ? ? Pt received in sidelying, awake and agreeable to therapy session, with good following of 1-step commands. Pt remains restless/impulsive due to scrotal discomfort but with improved tolerance to long sitting and bed mobility after OT placed scrotal sling/belt for comfort. Pt performed sidelying/seated LE exercises with good tolerance and needing up to maxA +2 for posterior scoot transfer from bed>chair. Pt up in recliner with spouse present and maxi sky lift pad under him for safety at end of session. Encouraged him to sit up in chair ~1 hour if pain allows. Pt continues to benefit from PT services to progress toward functional mobility goals.    ?Recommendations for follow up therapy are one component of a multi-disciplinary discharge planning process, led by the attending physician.  Recommendations may be updated based on patient status, additional functional criteria and insurance authorization. ? ?Follow Up Recommendations ? Acute inpatient rehab (3hours/day) ?  ?  ?Assistance Recommended at Discharge Frequent or constant Supervision/Assistance  ?Patient can return home with the following Two people to help with walking and/or transfers;Two people to help with bathing/dressing/bathroom;Assistance with cooking/housework;Assistance with feeding;Direct supervision/assist for medications  management;Direct supervision/assist for financial management;Assist for transportation;Help with stairs or ramp for entrance ?  ?Equipment Recommendations ? Wheelchair (measurements PT);Wheelchair cushion (measurements PT);Hospital bed;Other (comment);BSC/3in1 (mechanical lift, pending progress; drop arm BSC; slide board)  ?  ?Recommendations for Other Services   ? ? ?  ?Precautions / Restrictions Precautions ?Precautions: Fall;Other (comment) ?Precaution Comments: Contact; R BKA wound vac and L TMA prevena vac ?Required Braces or Orthoses: Other Brace ?Other Brace: limb guard ?Restrictions ?Weight Bearing Restrictions: Yes ?RLE Weight Bearing: Non weight bearing ?LLE Weight Bearing: Non weight bearing  ?  ? ?Mobility ? Bed Mobility ?Overal bed mobility: Needs Assistance ?Bed Mobility: Sidelying to Sit, Sit to Sidelying ?Rolling: Modified independent (Device/Increase time) ?Sidelying to sit: Min assist, +2 for safety/equipment ?  ?  ?Sit to sidelying: Min assist ?General bed mobility comments: Able to roll with use of bedrails multiple times for pad placement, scrotal sling adjustment. Min A to sit EOB to lift trunk and for safety, pt impulsive and returned to sidelying x3 times due to pain. Improved tolerance for long sit with sling in place. ?  ? ?Transfers ?Overall transfer level: Needs assistance ?Equipment used: None ?Transfers: Bed to chair/wheelchair/BSC ?  ?  ?  ?  ?Anterior-Posterior transfers: Max assist, +2 physical assistance, From elevated surface ?  ?General transfer comment: assisting fairly well to push through arms, especially when armrests within reach. consistent cues for sequencing needed and support to pull hips back with pad ?  ? ? ?  ?Balance Overall balance assessment: Needs assistance ?Sitting-balance support: No upper extremity supported, Feet supported, Bilateral upper extremity supported ?Sitting balance-Leahy Scale: Fair ?  ?Postural control: Posterior lean (to offload pain in  scrotum) ?  ?   ?  ? ?  ?  Cognition Arousal/Alertness: Awake/alert ?Behavior During Therapy: Impulsive, Flat affect ?Overall Cognitive Status: Impaired/Different from baseline ?Area of Impairment: Orientation, Memory, Attention, Following commands, Safety/judgement, Awareness, Problem solving ?  ?   ?Orientation Level: Disoriented to, Time, Situation ?Current Attention Level: Sustained ?Memory: Decreased recall of precautions, Decreased short-term memory ?Following Commands: Follows one step commands consistently, Follows one step commands with increased time, Follows multi-step commands inconsistently ?Safety/Judgement: Decreased awareness of safety, Decreased awareness of deficits ?Awareness: Intellectual ?Problem Solving: Slow processing, Decreased initiation, Difficulty sequencing, Requires verbal cues, Requires tactile cues ?General Comments: requires cues for safety but improving attention and command following; impulsive at times due to pain while repositioning ?  ?  ? ?  ?Exercises Amputee Exercises ?Hip ABduction/ADduction: AROM, Both, 5 reps, Seated ?Knee Flexion: AROM, Both, 15 reps, Seated, Sidelying ?Knee Extension: AROM, Both, 10 reps, Seated ?Chair Push Up: AROM, 5 reps, Seated (while scooting/repositioning in chair) ?Other Exercises ?Other Exercises: lateral leans with elbow taps x5 reps to R and ~2 reps to L ? ?  ?General Comments General comments (skin integrity, edema, etc.): spouse Olegario Messier present and supportive; VSS on RA ?  ?  ? ?Pertinent Vitals/Pain Pain Assessment ?Pain Assessment: Faces ?Faces Pain Scale: Hurts even more ?Pain Location: scrotal area with long sitting position, pain improves to mild when resting/leaning to side to offload ?Pain Descriptors / Indicators: Discomfort, Grimacing, Moaning ?Pain Intervention(s): Monitored during session, Repositioned, Other (comment) (stockinette belt to elevate scrotum placed by therapists)  ? ? ?   ?   ? ?PT Goals (current goals can now be found in  the care plan section) Acute Rehab PT Goals ?Patient Stated Goal: To get stronger in hospital and rehab then to go home ?PT Goal Formulation: With patient/family ?Time For Goal Achievement: 04/14/22 ?Progress towards PT goals: Progressing toward goals ? ?  ?Frequency ? ? ? Min 3X/week ? ? ? ?  ?PT Plan Current plan remains appropriate  ? ? ?Co-evaluation PT/OT/SLP Co-Evaluation/Treatment: Yes ?Reason for Co-Treatment: For patient/therapist safety;To address functional/ADL transfers;Necessary to address cognition/behavior during functional activity ?PT goals addressed during session: Mobility/safety with mobility;Balance;Strengthening/ROM ?OT goals addressed during session: ADL's and self-care;Strengthening/ROM ?  ? ?  ?AM-PAC PT "6 Clicks" Mobility   ?Outcome Measure ? Help needed turning from your back to your side while in a flat bed without using bedrails?: None ?Help needed moving from lying on your back to sitting on the side of a flat bed without using bedrails?: A Lot (mod cues for safety) ?Help needed moving to and from a bed to a chair (including a wheelchair)?: Total ?Help needed standing up from a chair using your arms (e.g., wheelchair or bedside chair)?: Total ?Help needed to walk in hospital room?: Total ?Help needed climbing 3-5 steps with a railing? : Total ?6 Click Score: 10 ? ?  ?End of Session Equipment Utilized During Treatment: Gait belt ?Activity Tolerance: Patient tolerated treatment well ?Patient left: in chair;with call bell/phone within reach;with chair alarm set;with family/visitor present ?Nurse Communication: Mobility status;Need for lift equipment;Precautions;Other (comment) (technique for return transfer (likely safest to use maxi sky lift, pad already under him), pt spouse leaving around 2pm will need to go back to bed for safety due to pt cognitive deficit at that time) ?PT Visit Diagnosis: Other abnormalities of gait and mobility (R26.89);Muscle weakness (generalized) (M62.81) ?   ? ? ?Time: 4034-7425 ?PT Time Calculation (min) (ACUTE ONLY): 36 min ? ?Charges:  $Therapeutic Exercise: 8-22 mins          ?          ? ?  Tayia Stonesifer P., PTA ?Acute Rehabilitation Services ?Secure Chat Preferred 9a-5:30pm ?Off

## 2022-04-02 NOTE — Progress Notes (Signed)
Occupational Therapy Treatment ?Patient Details ?Name: Jeffrey Ayala ?MRN: [id number] ?DOB: 08-22-1961 ?Today's Date: 04/02/2022 ? ? ?History of present illness Pt adm 4/2 c/o N/V/D and found to have septic shock due to scrotal cellulitis vs RLL PNA.  Pt intubated 4/3 and brief cardiac arrest post intubation. [name] started 4/5.  Pt with AKI and started on CRRT on 4/6. Pt extubated 4/10. Due to gangrene of B LE, pt underwent R BKA and L transmet amputations on 4/23. On 4/24 and 5/6, pt underwent I&D of scrotum due to dry gangrene. Pt also with Afib with RVR. PMH - Bil TKR, migraines ?  ?OT comments ? Session focused on progression of transfer/sitting tolerance with trial of modified scrotal sling to relieve pressure with movement due to pain limitations in previous session. With sling in place, pt able to better assist with AP transfer though still requires +2 assist for safety and to advance hips. Pt with much improved bed mobility with Min A at most needed and consistent one step command following noted. Wife present and engaged, hopeful for good progress with AIR level therapies.  ? ?Recommendations for follow up therapy are one component of a multi-disciplinary discharge planning process, led by the attending physician.  Recommendations may be updated based on patient status, additional functional criteria and insurance authorization. ?   ?Follow Up Recommendations ? Acute inpatient rehab (3hours/day)  ?  ?Assistance Recommended at Discharge Frequent or constant Supervision/Assistance  ?Patient can return home with the following ? Two people to help with walking and/or transfers;Two people to help with bathing/dressing/bathroom;Assistance with cooking/housework;Assistance with feeding;Direct supervision/assist for medications management;Direct supervision/assist for financial management;Assist for transportation;Help with stairs or ramp for entrance ?  ?Equipment Recommendations ? Wheelchair  (measurements OT);Wheelchair cushion (measurements OT);Hospital bed;BSC/3in1  ?  ?Recommendations for Other Services Rehab consult ? ?  ?Precautions / Restrictions Precautions ?Precautions: Fall;Other (comment) ?Required Braces or Orthoses: Other Brace ?Other Brace: limb guard ?Restrictions ?Weight Bearing Restrictions: Yes ?RLE Weight Bearing: Non weight bearing ?LLE Weight Bearing: Non weight bearing  ? ? ?  ? ?Mobility Bed Mobility ?Overal bed mobility: Needs Assistance ?Bed Mobility: Supine to Sit, Sit to Supine ?Rolling: Modified independent (Device/Increase time) ?  ?Supine to sit: Min assist ?Sit to supine: Min assist ?  ?General bed mobility comments: Able to roll with use of bedrails multiple times for pad placement, scrotal sling adjustment. Min A to sit EOB to lift trunk and for safety ?  ? ?Transfers ?Overall transfer level: Needs assistance ?Equipment used: None ?Transfers: Bed to chair/wheelchair/BSC ?  ?  ?  ?  ?Anterior-Posterior transfers: Max assist, +2 physical assistance, From elevated surface ?  ?General transfer comment: assisting fairly well to push through arms, especially when armrests within reach. consistent cues for sequencing needed and support to pull hips back with pad ?  ?  ?Balance Overall balance assessment: Needs assistance ?Sitting-balance support: No upper extremity supported, Feet supported, Bilateral upper extremity supported ?Sitting balance-Leahy Scale: Fair ?  ?  ?  ?  ?  ?  ?  ?  ?  ?  ?  ?  ?  ?  ?  ?  ?   ? ?ADL either performed or assessed with clinical judgement  ? ?ADL Overall ADL's : Needs assistance/impaired ?  ?  ?  ?  ?  ?  ?  ?  ?  ?  ?  ?  ?  ?  ?Toileting- Clothing Manipulation and Hygiene: Total assistance;Bed level ?Toileting -  Clothing Manipulation Details (indicate cue type and reason): after bowel incontinence ?  ?  ?  ?General ADL Comments: Emphasis on progression of transfers and sitting tolerance in chair. Time spent fabricating modified scrotal sling to  elevate, relieve pressure and avoid rubbing of scrotum on surfaces when scooting ?  ? ?Extremity/Trunk Assessment Upper Extremity Assessment ?Upper Extremity Assessment: RUE deficits/detail ?RUE Deficits / Details: cyst on elbow. wife reports this is baseline. however, pt with noted grimacing with elbow flexion and unable to reach to face. Pt's wife also reports likely need for shoulder replacement though shoulder AROM WFL ?  ?Lower Extremity Assessment ?Lower Extremity Assessment: Defer to PT evaluation ?  ?  ?  ? ?Vision   ?Vision Assessment?: No apparent visual deficits ?  ?Perception   ?  ?Praxis   ?  ? ?Cognition Arousal/Alertness: Awake/alert ?Behavior During Therapy: Impulsive, Flat affect ?Overall Cognitive Status: Impaired/Different from baseline ?Area of Impairment: Orientation, Memory, Attention, Following commands, Safety/judgement, Awareness, Problem solving ?  ?  ?  ?  ?  ?  ?  ?  ?Orientation Level: Disoriented to, Time, Situation ?Current Attention Level: Sustained ?Memory: Decreased recall of precautions, Decreased short-term memory ?Following Commands: Follows one step commands consistently, Follows one step commands with increased time, Follows multi-step commands inconsistently ?Safety/Judgement: Decreased awareness of safety, Decreased awareness of deficits ?Awareness: Intellectual ?Problem Solving: Slow processing, Decreased initiation, Difficulty sequencing, Requires verbal cues, Requires tactile cues ?General Comments: requires cues for safety but improving attention and command following ?  ?  ?   ?Exercises   ? ?  ?Shoulder Instructions   ? ? ?  ?General Comments Wife present, very supportive  ? ? ?Pertinent Vitals/ Pain       Pain Assessment ?Pain Assessment: Faces ?Faces Pain Scale: Hurts even more ?Pain Location: scrotal area with long sitting position, pain improves to mild when resting/leaning to side to offload ?Pain Descriptors / Indicators: Discomfort, Grimacing, Moaning ?Pain  Intervention(s): Repositioned ? ?Home Living   ?  ?  ?  ?  ?  ?  ?  ?  ?  ?  ?  ?  ?  ?  ?  ?  ?  ?  ? ?  ?Prior Functioning/Environment    ?  ?  ?  ?   ? ?Frequency ? Min 2X/week  ? ? ? ? ?  ?Progress Toward Goals ? ?OT Goals(current goals can now be found in the care plan section) ? Progress towards OT goals: Progressing toward goals ? ?Acute Rehab OT Goals ?Patient Stated Goal: go to rehab, decrease scrotal pain ?OT Goal Formulation: With patient ?Time For Goal Achievement: 04/15/22 ?Potential to Achieve Goals: Good ?ADL Goals ?Pt Will Perform Grooming: with min assist;sitting;bed level ?Pt Will Perform Upper Body Bathing: with mod assist;sitting ?Pt Will Transfer to Toilet: with max assist;with +2 assist;with transfer board ?Pt/caregiver will Perform Home Exercise Program: Increased strength;Both right and left upper extremity;With written HEP provided ?Additional ADL Goal #1: Pt will attend to and complete ADL tasks > 3 min with no more than min cueing.  ?Plan Discharge plan remains appropriate;Frequency remains appropriate   ? ?Co-evaluation ? ? ? PT/OT/SLP Co-Evaluation/Treatment: Yes ?Reason for Co-Treatment: For patient/therapist safety;To address functional/ADL transfers;Necessary to address cognition/behavior during functional activity ?  ?OT goals addressed during session: ADL's and self-care;Strengthening/ROM ?  ? ?  ?AM-PAC OT "6 Clicks" Daily Activity     ?Outcome Measure ? ? Help from another person eating meals?: A Little ?Help  from another person taking care of personal grooming?: A Little ?Help from another person toileting, which includes using toliet, bedpan, or urinal?: Total ?Help from another person bathing (including washing, rinsing, drying)?: A Lot ?Help from another person to put on and taking off regular upper body clothing?: A Little ?Help from another person to put on and taking off regular lower body clothing?: Total ?6 Click Score: 13 ? ?  ?End of Session   ? ?OT Visit Diagnosis:  Other abnormalities of gait and mobility (R26.89);Muscle weakness (generalized) (M62.81) ?  ?Activity Tolerance Patient tolerated treatment well ?  ?Patient Left in chair;with call bell/phone within reach;with family/vi

## 2022-04-02 NOTE — Progress Notes (Signed)
Patient ID: Jeffrey Ayala, male   DOB: [birth year], 61 y.o.   MRN: [id number] ?[facility] ?Progress Note  ? ?Assessment/ Plan:   ?1. Acute kidney Injury: ATN from severe septic shock in the setting of foot wounds.  Has been dialysis dependent since 02/26/2022 (initially started with CRRT and transition to IHD with first tx 4/12).   ?- Transition to HD per TTS schedule to match his new outpatient schedule.  Next HD on 5/13 ?- have ordered vein mapping - not yet done but scheduled for today. They would really like to get him through rehab before doing an AVF   ?- We are clipping as AKI for now and watching for recovery, though may be end stage given prolonged dependence on dialysis.  He has an outpatient HD unit (as an AKI) at [facility] on TTS schedule.  He has also been accepted to [facility] ? ?2.  Scrotal edema/wound: Status post surgical debridement by urology on 4/24 and on 5/6 underwent irrigation and debridement of scrotum with wound closure and drain placement.  Abx per primary team  ?- post void residual bladder scan and would in/out cath if over 300 mL   ? ?3.  Leg wound status post left TMA and right below-knee amputation on 03/15/2022: Septic shock resolved; abx per primary team  ? ?4.  Anemia - acute blood loss: s/p PRBC's x 2 units on 5/7 (maybe 3 per nursing) and 2 units PRBC's with HD on 5/8 as well.  PRBC's on 5/8 as well.  Spoke with team on 5/9. CT abd was negative for acute process.  Hb improved on 5/8 and 5/9 s/p the PRBC's.  On aranesp 200 mcg weekly on mondays  ? ?5.  Hyperphosphatemia: Secondary to acute kidney injury.  On binders and HD.  Note Switched from sevelamer to Fosrenol.  Improved control ? ?Disposition - still needs an outpatient HD unit.  Not able to be discharged without that plan in place.  spoke with HD SW on 5/8 to start clip as AKI ? ?Subjective:   ?He had just 258 mL uop documented over 5/10.  Last HD on 5/10 with 2.8 kg UF.  He had past elevated  post void residual bladder scan volumes but the last two were 111 and 58 mL  he's been accepted to a TTS outpatient HD chair.  He wants to continue to work so TTS works best.  ? ?Spoke with the patient and his wife at bedside.  He's working with therapy and lying flat right now but had trouble tolerating 20 minutes in a chair due to discomfort from his GU surgical wounds.   He wants to get through rehab first and get stronger before doing an AV fistula because they're worried about him not having the full use of his arm for therapy right now.  Also fighting infections.   ? ?Review of systems:    ?Denies shortness of breath or chest pain  ?Denies n/v ?  ? ?Objective:   ?BP 114/79 (BP Location: Right Arm)   Pulse 99   Temp 98.4 ?F (36.9 ?C) (Oral)   Resp 19   Ht '5\' 10"'$  (1.778 m)   Wt (!) 142.9 kg   SpO2 98%   BMI 45.20 kg/m?  ? ?Intake/Output Summary (Last 24 hours) at 04/02/2022 1209 ?Last data filed at 04/02/2022 0900 ?Gross per 24 hour  ?Intake 493 ml  ?Output 558 ml  ?Net -65 ml  ? ?Weight change: 0 kg ? ?  Physical Exam:       ?General adult male in bed lying flat in no acute distress ?HEENT normocephalic atraumatic extraocular movements intact sclera anicteric ?Neck supple trachea midline ?Lungs clear to auscultation bilaterally normal work of breathing at rest  ?Heart S1S2 no rub ?Abdomen soft nontender nondistended ?Ext: Status post left TMA and right BKA   ?Psych normal mood and affect ?GU - therapy is applying a sling  ?Access: RIJ tunn catheter ? ?Imaging: ?No results found. ? ?Labs: ?BMET ?Recent Labs  ?Lab 03/27/22 ?0217 03/28/22 ?0320 03/29/22 ?0200 03/30/22 ?0177 03/31/22 ?0123 04/01/22 ?0300 04/02/22 ?0141  ?NA 140 139 140  140 138 138 139 139  ?K 3.8 3.7 4.0  4.0 4.2 3.8 3.9 3.8  ?CL 104 103 103  102 104 103 103 103  ?CO2 '26 26 27  26 24 27 25 27  '$ ?GLUCOSE 103* 105* 132*  129* 105* 106* 106* 113*  ?BUN 50* 28* 41*  42* 60* 28* 43* 30*  ?CREATININE 6.46* 4.67* 6.35*  6.32* 7.69* 4.30* 6.51*  4.88*  ?CALCIUM 8.3* 8.4* 8.1*  8.1* 8.2* 8.0* 8.4* 8.3*  ?PHOS 6.3* 4.8* 7.2*  7.2* 9.2* 4.5 5.5* 4.2  ? ?CBC ?Recent Labs  ?Lab 03/29/22 ?0200 03/29/22 ?1458 03/29/22 ?2059 03/30/22 ?1156 03/30/22 ?1853 03/31/22 ?0123 04/01/22 ?0300  ?WBC 7.6  --  6.7 7.5  --  8.9 8.4  ?NEUTROABS 5.2  --  4.7  --   --  6.7 6.3  ?HGB 5.2*   < > 6.1* 6.8* 8.9* 8.3* 8.3*  ?HCT 16.4*   < > 19.2* 21.0* 25.7* 25.3* 25.9*  ?MCV 93.7  --  91.9 90.1  --  89.1 92.2  ?PLT 229  --  187 187  --  188 210  ? < > = values in this interval not displayed.  ? ? ?Medications:   ? ? (feeding supplement) PROSource Plus  30 mL Oral TID BM  ? sodium chloride   Intravenous Once  ? sodium chloride   Intravenous Once  ? acetaminophen  650 mg Oral Q6H  ? allopurinol  100 mg Oral Daily  ? amoxicillin-clavulanate  1 tablet Oral BID  ? vitamin C  1,000 mg Oral Daily  ? aspirin  81 mg Oral Daily  ? Chlorhexidine Gluconate Cloth  6 each Topical Q0600  ? darbepoetin (ARANESP) injection - DIALYSIS  200 mcg Intravenous Q Mon-HD  ? feeding supplement (NEPRO CARB STEADY)  237 mL Oral BID BM  ? fiber  1 packet Oral BID  ? folic acid  1 mg Oral Daily  ? guaiFENesin  400 mg Oral Q8H  ? lanthanum  1,000 mg Oral TID WC  ? leptospermum manuka honey  1 application. Topical Daily  ? melatonin  3 mg Oral QHS  ? metoprolol tartrate  37.5 mg Oral TID  ? multivitamin  1 tablet Oral QHS  ? nutrition supplement (JUVEN)  1 packet Oral BID BM  ? pantoprazole  40 mg Oral Daily  ? QUEtiapine  25 mg Oral QHS  ? sodium chloride flush  3 mL Intravenous Q12H  ? sodium hypochlorite   Irrigation BID  ? ?Claudia Desanctis, MD ?04/02/2022, 12:33 PM ? ? ? ? ?

## 2022-04-02 NOTE — H&P (Signed)
In error

## 2022-04-02 NOTE — Progress Notes (Signed)
Pt has been accepted at [facility]) on TTS. Pt will need to arrive at 9:45 for first treatment to complete paperwork prior to 10:20 chair time. Clinic advised pt has been accepted to [facility] inpt rehab and that navigator will contact clinic to coordinate start date. Spoke to pt's wife via phone. Provided the above details and wife agreeable to arrangements. Schedule letter text to pt's wife per her request. Will assist as needed.  ? ?Melven Sartorius ?Renal Navigator ?[phone] ?

## 2022-04-02 NOTE — Progress Notes (Signed)
Nutrition Follow-up ? ?DOCUMENTATION CODES:  ? ?Non-severe (moderate) malnutrition in context of acute illness/injury ? ?INTERVENTION:  ? ?Continue Nepro Shake po BID, each supplement provides 425 kcal and 19 grams protein. ? ?Continue Juven 1 packet PO BID, each packet provides 80 calories, 8 grams of carbohydrate, 2.5  grams of protein (collagen), 7 grams of L-arginine and 7 grams of L-glutamine; supplement contains CaHMB, Vitamins C, E, B12 and Zinc to promote wound healing. ? ?Continue Prosource Plus 30 ml PO TID, each packet provides 100 kcal and 15 gm protein. ? ?Continue vitamin C supplement. ? ?Continue Renal MVI daily. ? ?NUTRITION DIAGNOSIS:  ? ?Moderate Malnutrition related to acute illness (septic shock, A fib, AKI) as evidenced by mild fat depletion, mild muscle depletion, moderate muscle depletion. ? ?Ongoing  ? ?GOAL:  ? ?Patient will meet greater than or equal to 90% of their needs ? ?Progressing ? ?MONITOR:  ? ?PO intake, Supplement acceptance, Labs, Skin ? ?REASON FOR ASSESSMENT:  ? ?Consult ?Diet education, Calorie Count (renal failure/carb modified) ? ?ASSESSMENT:  ? ?61 year old male who presented to the ED on 4/02 with N/V/D. PMH of HTN, PUD, eosinophilic esophagitis, kidney stones. Pt admitted with septic shock, new onset atrial fibrillation, AKI. ? ?5/6 S/P I&D of scrotum and wound closure. ? ?Currently on a renal diet with 1200 ml fluid restriction. ?Meal intakes: 75% of breakfast and lunch today ?Supplements: Nepro BID, Prosource Plus TID, Juven BID, Vitamin C 1,000 mg/d ? ?Patient states that he has a good appetite and he has been eating well. He does not really like the taste of the Nepro supplement, but he has been drinking it anyway. He is also drinking Juven BID, and taking Prosource Plus TID.  ? ?Labs reviewed.  ?CBG: 124-146 ? ?Medications reviewed and include vitamin C, Aranesp, Nutrisource Fiber, folic acid, Novolog, Rena-vit, Renvela. ? ?Diet Order:   ?Diet Order   ? ?       ?   Diet renal with fluid restriction Fluid restriction: 1200 mL Fluid; Room service appropriate? Yes; Fluid consistency: Thin  Diet effective now       ?  ? ?  ?  ? ?  ? ? ?EDUCATION NEEDS:  ? ?No education needs have been identified at this time ? ?Skin:  Skin Assessment: Skin Integrity Issues: ?Skin Integrity Issues:: Stage II, Other (Comment), Wound VAC, Incisions ?Stage II: coccyx ?Wound Vac: - ?Incisions: scrotum ?Other: skin tear L thigh and R elbow ? ?Last BM:  5/11 type 5 ? ?Height:  ? ?Ht Readings from Last 1 Encounters:  ?03/28/22 '5\' 10"'$  (1.778 m)  ? ? ?Weight:  ? ?Wt Readings from Last 1 Encounters:  ?04/02/22 (!) 142.9 kg  ? ? ?Ideal Body Weight:  70.6 kg ? ?BMI:  Body mass index is 45.2 kg/m?. ? ?Estimated Nutritional Needs:  ? ?Kcal:  2300-2500 ? ?Protein:  150-170 grams ? ?Fluid:  >/= 2.0 L ? ? ? ?Lucas Mallow RD, LDN, CNSC ?Please refer to Amion for contact information.                                                       ? ?

## 2022-04-03 ENCOUNTER — Inpatient Hospital Stay (HOSPITAL_COMMUNITY): Payer: BC Managed Care – PPO

## 2022-04-03 ENCOUNTER — Other Ambulatory Visit: Payer: Self-pay

## 2022-04-03 ENCOUNTER — Encounter (HOSPITAL_COMMUNITY): Payer: Self-pay | Admitting: Physical Medicine & Rehabilitation

## 2022-04-03 ENCOUNTER — Inpatient Hospital Stay (HOSPITAL_COMMUNITY)
Admission: RE | Admit: 2022-04-03 | Discharge: 2022-04-15 | DRG: 945 | Disposition: A | Discharge status: Home / Self Care | Payer: BC Managed Care – PPO | Source: Intra-hospital | Attending: Physical Medicine & Rehabilitation | Admitting: Physical Medicine & Rehabilitation

## 2022-04-03 DIAGNOSIS — K591 Functional diarrhea: Secondary | ICD-10-CM

## 2022-04-03 DIAGNOSIS — Z96653 Presence of artificial knee joint, bilateral: Secondary | ICD-10-CM | POA: Diagnosis not present

## 2022-04-03 DIAGNOSIS — Z885 Allergy status to narcotic agent status: Secondary | ICD-10-CM

## 2022-04-03 DIAGNOSIS — E46 Unspecified protein-calorie malnutrition: Secondary | ICD-10-CM | POA: Diagnosis not present

## 2022-04-03 DIAGNOSIS — I96 Gangrene, not elsewhere classified: Secondary | ICD-10-CM | POA: Diagnosis present

## 2022-04-03 DIAGNOSIS — I480 Paroxysmal atrial fibrillation: Secondary | ICD-10-CM

## 2022-04-03 DIAGNOSIS — R5381 Other malaise: Secondary | ICD-10-CM | POA: Diagnosis not present

## 2022-04-03 DIAGNOSIS — I10 Essential (primary) hypertension: Secondary | ICD-10-CM

## 2022-04-03 DIAGNOSIS — Z888 Allergy status to other drugs, medicaments and biological substances status: Secondary | ICD-10-CM

## 2022-04-03 DIAGNOSIS — I4891 Unspecified atrial fibrillation: Secondary | ICD-10-CM | POA: Diagnosis present

## 2022-04-03 DIAGNOSIS — A419 Sepsis, unspecified organism: Secondary | ICD-10-CM | POA: Diagnosis not present

## 2022-04-03 DIAGNOSIS — N186 End stage renal disease: Secondary | ICD-10-CM | POA: Diagnosis not present

## 2022-04-03 DIAGNOSIS — N170 Acute kidney failure with tubular necrosis: Secondary | ICD-10-CM | POA: Diagnosis not present

## 2022-04-03 DIAGNOSIS — E669 Obesity, unspecified: Secondary | ICD-10-CM | POA: Diagnosis present

## 2022-04-03 DIAGNOSIS — T3695XA Adverse effect of unspecified systemic antibiotic, initial encounter: Secondary | ICD-10-CM | POA: Diagnosis present

## 2022-04-03 DIAGNOSIS — Z89511 Acquired absence of right leg below knee: Secondary | ICD-10-CM | POA: Diagnosis not present

## 2022-04-03 DIAGNOSIS — S88111A Complete traumatic amputation at level between knee and ankle, right lower leg, initial encounter: Secondary | ICD-10-CM

## 2022-04-03 DIAGNOSIS — Z992 Dependence on renal dialysis: Secondary | ICD-10-CM

## 2022-04-03 DIAGNOSIS — M109 Gout, unspecified: Secondary | ICD-10-CM | POA: Diagnosis not present

## 2022-04-03 DIAGNOSIS — Z6841 Body Mass Index (BMI) 40.0 and over, adult: Secondary | ICD-10-CM | POA: Diagnosis not present

## 2022-04-03 DIAGNOSIS — M25561 Pain in right knee: Secondary | ICD-10-CM

## 2022-04-03 DIAGNOSIS — G9341 Metabolic encephalopathy: Secondary | ICD-10-CM | POA: Diagnosis not present

## 2022-04-03 DIAGNOSIS — I739 Peripheral vascular disease, unspecified: Secondary | ICD-10-CM | POA: Diagnosis not present

## 2022-04-03 DIAGNOSIS — R197 Diarrhea, unspecified: Secondary | ICD-10-CM | POA: Diagnosis present

## 2022-04-03 DIAGNOSIS — Z8674 Personal history of sudden cardiac arrest: Secondary | ICD-10-CM | POA: Diagnosis not present

## 2022-04-03 DIAGNOSIS — D62 Acute posthemorrhagic anemia: Secondary | ICD-10-CM

## 2022-04-03 DIAGNOSIS — R112 Nausea with vomiting, unspecified: Secondary | ICD-10-CM | POA: Diagnosis not present

## 2022-04-03 DIAGNOSIS — N179 Acute kidney failure, unspecified: Secondary | ICD-10-CM | POA: Diagnosis not present

## 2022-04-03 DIAGNOSIS — R6521 Severe sepsis with septic shock: Secondary | ICD-10-CM | POA: Diagnosis not present

## 2022-04-03 HISTORY — DX: Other dysphagia: R13.19

## 2022-04-03 LAB — RENAL FUNCTION PANEL
Albumin: 2.4 g/dL — ABNORMAL LOW (ref 3.5–5.0)
Anion gap: 12 (ref 5–15)
BUN: 47 mg/dL — ABNORMAL HIGH (ref 8–23)
CO2: 22 mmol/L (ref 22–32)
Calcium: 8.3 mg/dL — ABNORMAL LOW (ref 8.9–10.3)
Chloride: 103 mmol/L (ref 98–111)
Creatinine, Ser: 6.73 mg/dL — ABNORMAL HIGH (ref 0.61–1.24)
GFR, Estimated: 9 mL/min — ABNORMAL LOW (ref >60)
Glucose, Bld: 111 mg/dL — ABNORMAL HIGH (ref 70–99)
Phosphorus: 5.9 mg/dL — ABNORMAL HIGH (ref 2.5–4.6)
Potassium: 3.7 mmol/L (ref 3.5–5.1)
Sodium: 137 mmol/L (ref 135–145)

## 2022-04-03 LAB — GLUCOSE, CAPILLARY
Glucose-Capillary: 109 mg/dL — ABNORMAL HIGH (ref 70–99)
Glucose-Capillary: 116 mg/dL — ABNORMAL HIGH (ref 70–99)

## 2022-04-03 LAB — HEPATITIS B SURFACE ANTIBODY,QUALITATIVE: Hep B S Ab: NONREACTIVE

## 2022-04-03 LAB — HEPATITIS B SURFACE ANTIGEN: Hepatitis B Surface Ag: NONREACTIVE

## 2022-04-03 MED ORDER — RENA-VITE PO TABS
1.0000 | ORAL_TABLET | Freq: Every day | ORAL | Status: DC
  Administered 2022-04-03 – 2022-04-14 (×12): 1 via ORAL
  Filled 2022-04-03 (×12): qty 1

## 2022-04-03 MED ORDER — LANTHANUM CARBONATE 500 MG PO CHEW
1000.0000 mg | CHEWABLE_TABLET | Freq: Three times a day (TID) | ORAL | Status: DC
Start: 2022-04-03 — End: 2022-04-15
  Administered 2022-04-03 – 2022-04-15 (×35): 1000 mg via ORAL
  Filled 2022-04-03 (×36): qty 2

## 2022-04-03 MED ORDER — ASPIRIN 81 MG PO CHEW
81.0000 mg | CHEWABLE_TABLET | Freq: Every day | ORAL | Status: DC
Start: 2022-04-04 — End: 2022-04-15
  Administered 2022-04-04 – 2022-04-15 (×11): 81 mg via ORAL
  Filled 2022-04-03 (×12): qty 1

## 2022-04-03 MED ORDER — PROCHLORPERAZINE 25 MG RE SUPP: 12.5000 mg | Freq: Four times a day (QID) | RECTAL | Status: DC | PRN

## 2022-04-03 MED ORDER — NON FORMULARY: 1.0000 "application " | Freq: Two times a day (BID) | Status: DC

## 2022-04-03 MED ORDER — ACETAMINOPHEN 325 MG PO TABS
325.0000 mg | ORAL_TABLET | ORAL | Status: DC | PRN
  Administered 2022-04-04 – 2022-04-14 (×2): 650 mg via ORAL
  Filled 2022-04-03 (×2): qty 2

## 2022-04-03 MED ORDER — AMOXICILLIN-POT CLAVULANATE 500-125 MG PO TABS
1.0000 | ORAL_TABLET | Freq: Two times a day (BID) | ORAL | Status: AC
  Administered 2022-04-03: 500 mg via ORAL
  Filled 2022-04-03: qty 1

## 2022-04-03 MED ORDER — CHLORHEXIDINE GLUCONATE CLOTH 2 % EX PADS
6.0000 | MEDICATED_PAD | Freq: Every day | CUTANEOUS | Status: DC
Start: 2022-04-04 — End: 2022-04-03

## 2022-04-03 MED ORDER — METOPROLOL TARTRATE 37.5 MG PO TABS: 37.5000 mg | ORAL_TABLET | Freq: Three times a day (TID) | ORAL | Status: DC

## 2022-04-03 MED ORDER — LIDOCAINE HCL (PF) 1 % IJ SOLN: 5.0000 mL | INTRAMUSCULAR | Status: DC | PRN

## 2022-04-03 MED ORDER — DARBEPOETIN ALFA 200 MCG/0.4ML IJ SOSY: 200.0000 ug | PREFILLED_SYRINGE | INTRAMUSCULAR | Status: DC

## 2022-04-03 MED ORDER — PROCHLORPERAZINE MALEATE 5 MG PO TABS: 5.0000 mg | ORAL_TABLET | Freq: Four times a day (QID) | ORAL | Status: DC | PRN

## 2022-04-03 MED ORDER — QUETIAPINE FUMARATE 25 MG PO TABS: 25.0000 mg | ORAL_TABLET | Freq: Every day | ORAL | Status: DC

## 2022-04-03 MED ORDER — SODIUM CHLORIDE 0.9 % IV SOLN: 100.0000 mL | INTRAVENOUS | Status: DC | PRN

## 2022-04-03 MED ORDER — DAKINS (FULL STRENGTH) SOLUTION 0.5%
Freq: Two times a day (BID) | CUTANEOUS | Status: DC
  Filled 2022-04-03: qty 1000

## 2022-04-03 MED ORDER — SODIUM CHLORIDE 0.9 % IV SOLN
150.0000 mg | Freq: Three times a day (TID) | INTRAVENOUS | Status: AC
  Administered 2022-04-03: 150 mg via INTRAVENOUS
  Filled 2022-04-03: qty 1.1
  Filled 2022-04-03: qty 1.14

## 2022-04-03 MED ORDER — POLYETHYLENE GLYCOL 3350 17 G PO PACK: 17.0000 g | PACK | Freq: Every day | ORAL | Status: DC | PRN

## 2022-04-03 MED ORDER — NEPRO/CARBSTEADY PO LIQD
237.0000 mL | Freq: Two times a day (BID) | ORAL | Status: DC
  Administered 2022-04-04 – 2022-04-08 (×5): 237 mL via ORAL

## 2022-04-03 MED ORDER — ACETAMINOPHEN 325 MG PO TABS
650.0000 mg | ORAL_TABLET | Freq: Four times a day (QID) | ORAL | Status: DC
  Administered 2022-04-03 – 2022-04-15 (×33): 650 mg via ORAL
  Filled 2022-04-03 (×35): qty 2

## 2022-04-03 MED ORDER — BISACODYL 10 MG RE SUPP: 10.0000 mg | Freq: Every day | RECTAL | Status: DC | PRN

## 2022-04-03 MED ORDER — LOPERAMIDE HCL 1 MG/7.5ML PO SUSP
2.0000 mg | ORAL | Status: DC | PRN
  Filled 2022-04-03: qty 15

## 2022-04-03 MED ORDER — ALLOPURINOL 100 MG PO TABS
100.0000 mg | ORAL_TABLET | Freq: Every day | ORAL | Status: DC
  Administered 2022-04-04 – 2022-04-15 (×11): 100 mg via ORAL
  Filled 2022-04-03 (×12): qty 1

## 2022-04-03 MED ORDER — OXYCODONE HCL 5 MG PO TABS: 5.0000 mg | ORAL_TABLET | ORAL | Status: DC | PRN

## 2022-04-03 MED ORDER — AMOXICILLIN-POT CLAVULANATE 500-125 MG PO TABS: 1.0000 | ORAL_TABLET | Freq: Two times a day (BID) | ORAL | Status: DC

## 2022-04-03 MED ORDER — DIPHENHYDRAMINE HCL 12.5 MG/5ML PO ELIX: 12.5000 mg | ORAL_SOLUTION | Freq: Four times a day (QID) | ORAL | Status: DC | PRN

## 2022-04-03 MED ORDER — QUETIAPINE FUMARATE 25 MG PO TABS
25.0000 mg | ORAL_TABLET | Freq: Every day | ORAL | Status: DC
  Administered 2022-04-03 – 2022-04-06 (×4): 25 mg via ORAL
  Filled 2022-04-03 (×4): qty 1

## 2022-04-03 MED ORDER — FOLIC ACID 1 MG PO TABS
1.0000 mg | ORAL_TABLET | Freq: Every day | ORAL | Status: DC
  Administered 2022-04-04 – 2022-04-15 (×11): 1 mg via ORAL
  Filled 2022-04-03 (×12): qty 1

## 2022-04-03 MED ORDER — ASCORBIC ACID 500 MG PO TABS
1000.0000 mg | ORAL_TABLET | Freq: Every day | ORAL | Status: DC
  Administered 2022-04-04 – 2022-04-15 (×11): 1000 mg via ORAL
  Filled 2022-04-03 (×12): qty 2

## 2022-04-03 MED ORDER — ALTEPLASE 2 MG IJ SOLR
2.0000 mg | Freq: Once | INTRAMUSCULAR | Status: DC | PRN
  Filled 2022-04-03: qty 2

## 2022-04-03 MED ORDER — PROCHLORPERAZINE EDISYLATE 10 MG/2ML IJ SOLN: 5.0000 mg | Freq: Four times a day (QID) | INTRAMUSCULAR | Status: DC | PRN

## 2022-04-03 MED ORDER — NUTRISOURCE FIBER PO PACK
1.0000 | PACK | Freq: Two times a day (BID) | ORAL | Status: DC
  Filled 2022-04-03 (×6): qty 1

## 2022-04-03 MED ORDER — GUAIFENESIN 100 MG/5ML PO LIQD
400.0000 mg | Freq: Three times a day (TID) | ORAL | Status: DC
  Administered 2022-04-07 – 2022-04-14 (×11): 400 mg via ORAL
  Filled 2022-04-03 (×37): qty 20

## 2022-04-03 MED ORDER — GUAIFENESIN-DM 100-10 MG/5ML PO SYRP
5.0000 mL | ORAL_SOLUTION | Freq: Four times a day (QID) | ORAL | Status: DC | PRN
  Administered 2022-04-10 – 2022-04-12 (×2): 10 mL via ORAL
  Filled 2022-04-03 (×2): qty 10

## 2022-04-03 MED ORDER — WHITE PETROLATUM EX OINT: TOPICAL_OINTMENT | CUTANEOUS | Status: DC | PRN

## 2022-04-03 MED ORDER — TRAZODONE HCL 50 MG PO TABS
25.0000 mg | ORAL_TABLET | Freq: Every evening | ORAL | Status: DC | PRN
  Administered 2022-04-07 – 2022-04-10 (×3): 50 mg via ORAL
  Filled 2022-04-03 (×4): qty 1

## 2022-04-03 MED ORDER — MEDIHONEY WOUND/BURN DRESSING EX PSTE: 1.0000 "application " | PASTE | Freq: Every day | CUTANEOUS | Status: DC

## 2022-04-03 MED ORDER — HEPARIN SODIUM (PORCINE) 1000 UNIT/ML DIALYSIS
1000.0000 [IU] | INTRAMUSCULAR | Status: DC | PRN
  Filled 2022-04-03 (×2): qty 1

## 2022-04-03 MED ORDER — ALUMINUM HYDROXIDE GEL 320 MG/5ML PO SUSP
10.0000 mL | Freq: Four times a day (QID) | ORAL | Status: DC | PRN
  Filled 2022-04-03: qty 30

## 2022-04-03 MED ORDER — MEDIHONEY WOUND/BURN DRESSING EX PSTE
1.0000 "application " | PASTE | Freq: Every day | CUTANEOUS | Status: DC
  Administered 2022-04-04 – 2022-04-05 (×2): 1 via TOPICAL
  Filled 2022-04-03: qty 44

## 2022-04-03 MED ORDER — JUVEN PO PACK
1.0000 | PACK | Freq: Two times a day (BID) | ORAL | Status: DC
  Administered 2022-04-04 – 2022-04-06 (×3): 1 via ORAL
  Filled 2022-04-03 (×4): qty 1

## 2022-04-03 MED ORDER — LIDOCAINE-PRILOCAINE 2.5-2.5 % EX CREA
1.0000 "application " | TOPICAL_CREAM | CUTANEOUS | Status: DC | PRN
  Filled 2022-04-03: qty 5

## 2022-04-03 MED ORDER — CHLORHEXIDINE GLUCONATE CLOTH 2 % EX PADS
6.0000 | MEDICATED_PAD | Freq: Every day | CUTANEOUS | Status: DC
  Administered 2022-04-04 – 2022-04-08 (×5): 6 via TOPICAL

## 2022-04-03 MED ORDER — ASPIRIN 81 MG PO CHEW: 81.0000 mg | CHEWABLE_TABLET | Freq: Every day | ORAL | Status: DC

## 2022-04-03 MED ORDER — SIMETHICONE 80 MG PO CHEW: 80.0000 mg | CHEWABLE_TABLET | Freq: Four times a day (QID) | ORAL | Status: DC | PRN

## 2022-04-03 MED ORDER — HYDROXYZINE HCL 10 MG/5ML PO SYRP
25.0000 mg | ORAL_SOLUTION | Freq: Two times a day (BID) | ORAL | Status: DC | PRN
  Filled 2022-04-03: qty 12.5

## 2022-04-03 MED ORDER — PANTOPRAZOLE SODIUM 40 MG PO TBEC
40.0000 mg | DELAYED_RELEASE_TABLET | Freq: Every day | ORAL | Status: DC
  Administered 2022-04-04 – 2022-04-15 (×11): 40 mg via ORAL
  Filled 2022-04-03 (×12): qty 1

## 2022-04-03 MED ORDER — METOPROLOL TARTRATE 25 MG PO TABS
37.5000 mg | ORAL_TABLET | Freq: Three times a day (TID) | ORAL | Status: DC
  Administered 2022-04-03 – 2022-04-15 (×28): 37.5 mg via ORAL
  Filled 2022-04-03 (×30): qty 1

## 2022-04-03 MED ORDER — PENTAFLUOROPROP-TETRAFLUOROETH EX AERO: 1.0000 "application " | INHALATION_SPRAY | CUTANEOUS | Status: DC | PRN

## 2022-04-03 MED ORDER — MILK AND MOLASSES ENEMA
1.0000 | Freq: Every day | RECTAL | Status: DC | PRN
  Filled 2022-04-03: qty 240

## 2022-04-03 MED ORDER — OXYCODONE HCL 5 MG PO TABS: 5.0000 mg | ORAL_TABLET | ORAL | 0 refills | Status: DC | PRN

## 2022-04-03 MED ORDER — PROSOURCE PLUS PO LIQD
30.0000 mL | Freq: Three times a day (TID) | ORAL | Status: DC
  Administered 2022-04-06 – 2022-04-07 (×4): 30 mL via ORAL
  Filled 2022-04-03 (×7): qty 30

## 2022-04-03 MED ORDER — MELATONIN 3 MG PO TABS
3.0000 mg | ORAL_TABLET | Freq: Every day | ORAL | Status: DC
  Administered 2022-04-03 – 2022-04-06 (×4): 3 mg via ORAL
  Filled 2022-04-03 (×4): qty 1

## 2022-04-03 MED ORDER — SODIUM CHLORIDE 0.9 % IV SOLN
150.0000 mg | Freq: Three times a day (TID) | INTRAVENOUS | Status: DC
Start: 2022-04-03 — End: 2022-04-15

## 2022-04-03 NOTE — Consult Note (Signed)
WOC Nurse Consult Note: ?Patient receiving care in [place] ?Discharging to CIR today ?Reason for Consult: Right elbow and RFA non healing wounds ?Wound type: Full thickness wounds on the RFA and Right elbow ?Pressure Injury POA: NA ?Measurement: RFA: 3.8 cm x 2 cm x 0.1 cm  ?Right elbow: 1.3 cm x 1.3 cm x 0.3 cm ?Wound bed: Both wounds are yellow with erythema surrounding  ?Drainage (amount, consistency, odor)  Scant tan drainage on foam dressing.  ?Dressing procedure/placement/frequency: ?Clean the RFA and Right elbow wounds with NS, pat dry then place a cut to fit piece of Aquacel Advantage Kellie Simmering # [phone]) over the wound and secure with foam dressing. Change daily. ? ?Monitor the wound area(s) for worsening of condition such as: ?Signs/symptoms of infection, increase in size, development of or worsening of odor, ?development of pain, or increased pain at the affected locations.   ?Notify the medical team if any of these develop. ? ?Thank you for the consult. [place] nurse will not follow at this time.   ?Please re-consult the [place] team if needed. ? ?Cathlean [place]se[place]. Tamala Julian, MSN, RN, CMSRN, AGCNS, WTA ?Wound Treatment Associate ?Pager [phone]   ? ? ? ? ?  ?

## 2022-04-03 NOTE — TOC Transition Note (Signed)
Transition of Care (TOC) - CM/SW Discharge Note ?Marvetta Gibbons Therapist, sports, BSN ?Transitions of Care ?Unit 4E- RN Case Manager ?See Treatment Team for direct phone #  ? ? ?Patient Details  ?Name: Jeffrey Ayala ?MRN: [id number] ?Date of Birth: [birth year] ? ?Transition of Care (TOC) CM/SW Contact:  ?Dahlia Client, Romeo Rabon, RN ?Phone Number: ?04/03/2022, 1:08 PM ? ? ?Clinical Narrative:    ?Notified by Pamala Hurry with [facility] re[facility]- pt will have bed available today for rehab admission. Pt has insurance auth and plan to transition to [facility] rehab later today.  ? ?No further TOC needs, MD has cleared pt for INPT rehab.  ? ? ?Final next level of care: [place] ?Barriers to Discharge: Barriers Resolved ? ? ?Patient Goals and CMS Choice ?Patient states their goals for this hospitalization and ongoing recovery are:: rehab then return home ?CMS Medicare.gov Compare Post Acute Care list provided to:: Patient ?Choice offered to / list presented to : Patient, Spouse ? ?Discharge Placement ?  ?           ? [facility] ?  ?  ?  ? ?Discharge Plan and Services ?  ?Discharge Planning Services: CM Consult ?Post Acute Care Choice: IP Rehab          ?DME Arranged: N/A ?DME Agency: NA ?  ?  ?  ?HH Arranged: NA ?[place] Agency: NA ?  ?  ?  ? ?Social Determinants of Health (SDOH) Interventions ?  ? ? ?Readmission Risk Interventions ? ?  04/03/2022  ?  1:07 PM  ?Readmission Risk Prevention Plan  ?Transportation Screening Complete  ?Medication Review Press photographer) Complete  ?[place] or Home Care Consult Complete  ?SW Recovery Care/Counseling Consult Complete  ?Palliative Care Screening Complete  ?[place] Not Applicable  ? ? ? ? ? ?

## 2022-04-03 NOTE — Progress Notes (Signed)
Bilateral upper extremity vein mapping has been completed. ?Preliminary results can be found in CV Proc through chart review.  ?Results were given to the patient's nurse, [place]. ? ?04/03/22 10:44 AM ?Jeffrey Ayala RVT   ?

## 2022-04-03 NOTE — H&P (Addendum)
? ? ?Physical Medicine and Rehabilitation Admission H&P ?  ?Chief Complaint  ?Patient presents with  ? Functional deficits due to BKA  ? Debility due to multiple medical issues   ? ? ?HPI: Jeffrey Ayala is a 61 year old male with history of eosinopilic esophagitis, PUD, migraines who was originally admitted on 02/22/22 with fever-102.9, hypoxia, N/V/D and A fib with HR in 120-160 due to sepsis. He was intubated in ED and had brief cardiac arrest with resuscitation with ACLS protocol and elevated trops felt to be due to demand ischemia from sepsis.  He was started on broad spectrum antibiotics and fluid boluses but required pressures due to persistent hypotension. Work up revealed cortical fluid filled colon seen w/diarrhea, marked scrotal thickening without abscess concerning for cellulitis, R>L hydrocele and cortical thinning of right kidney.  Dr. Alinda Money consulted and recommended monitoring and UA done revealing pyuria/hematuria as scrotum felt likely not to be the source of infection. He had worsening of respiratory status with agitation requiring intubation on 04/03 as well as multiple pressors. . Follow up CT without abscess but he developed ecchymosis with significant edema and weeping of scrotum, anasarca and mottled/cool bilateral feet/toes.   He received methylene blue (to correct vasoplegia), started on stress dose steroids, IV heparin-->bivalirudin and antibiotics changed to Zyvox and Zosyn.  Dr. Hollie Salk consulted for acute renal failure with decrease in UOP as well as hypocalcemia.  He went on to develop oliguria and was started on CRRT on 04/06. ? ?VVS consulted for input on BLE ischemia and recommended monitoring for demarcation due to dry gangrene.  Cardiology following for input and no plans for ischemia work, possible DCCV if A fib persistet  and ultimate plans to transition to Eliquis as able. He was weaned off pressors and extubated without difficulty 04/10.  He started developing scrotal pain   with areas of sloughing and rise in WBC on 04/11. Scrotal edema was improving but with dry gangrenous changes of scrotum  with plans to coordinate and surgery with [name] once cleared by cardiology. He underwent Left transmetatarsal amputation and right BKA on 04/23 by [name]. He was taken back to OR on 04/23 for debridement of scrotum and post op with hypotension and agitation requiring intubation till 04/25. ABLA treated with on unit PRBC. 2 D echo showed EF 60-65% without wall abnormality and cardiology felt that [facility] not needed due to anemia, rate controlled A fib and low CHADVASc score. He tolerated extubation but continued to have confusion/agitation due to delirium. Was on tube feeds for nutritional support but cortak pulled out and he was cleared for D3 diet with aspiration precautions.  ? ?Dr. Baxter Flattery consulted on 04/29 as scrotal cultures grew  MDRO pseudomonas, enterococcus faecalis and bacteroides ovatus.  He required repeat I & D with closure of wound 05/06  and completes 2 week course of antibiotics--> Zerbaxa and Augmentin today.  He required 2 additional units 05/07 and 05/08. CT abdomen pelvis done and was negative for source of bleeding.   Wound VAC removed and shrinkers placed on BLE--is NWB BLE.  He is felt to have endstage renal failure and has been [facility] to [facility]  TTS schedule.  BUE vein mapping completed with plans for AVF in the future (patient wanted to hold off for now). Light bleeding from scrotal wound treated with dry dressing and Dakin's discontinued?  WOC consulted for input on right elbow and RFA non healing wounds and recommended Aquacel with daily dressing changes.  He continues to be limited by weakness, groin pain (working on increasing sitting time to >20 minutes)  and requires multimodal cues with increased time for processing/sequencing. CIR recommended due to functional decline. Denies phantom limb pain. ? ? ?Review of Systems  ?Constitutional:  Negative for chills and  fever.  ?HENT:  Negative for hearing loss and tinnitus.   ?Eyes:  Negative for blurred vision and double vision.  ?Respiratory:  Negative for cough and shortness of breath.   ?Cardiovascular:  Negative for chest pain, palpitations and leg swelling.  ?Gastrointestinal:  Positive for diarrhea. Negative for heartburn and nausea.  ?Musculoskeletal:  Negative for back pain, joint pain and myalgias.  ?Skin:  Positive for itching (all over).  ?Neurological:  Positive for weakness. Negative for dizziness and headaches.  ? ? ?Past Medical History:  ?Diagnosis Date  ? Concussion   ? Eosinophilic esophagitis   ? Gastric ulcer   ? History of kidney stones   ? Migraine   ? ? ?Past Surgical History:  ?Procedure Laterality Date  ? AMPUTATION Right 03/15/2022  ? Procedure: RIGHT BELOW KNEE AMPUTATION;  Surgeon: [name], MD;  Location: [place];  Service: Orthopedics;  Laterality: Right;  ? CYSTOSCOPY    ? DENTAL SURGERY    ? FINGER SURGERY    ? HERNIA REPAIR    ? at age 73  ? IR FLUORO GUIDE CV LINE RIGHT  03/09/2022  ? IR PATIENT EVAL TECH 0-60 MINS  03/09/2022  ? IR US GUIDE VASC ACCESS RIGHT  03/09/2022  ? KNEE ARTHROSCOPY Left   ? LITHOTRIPSY    ? NEPHROLITHOTOMY    ? SCROTAL EXPLORATION N/A 03/16/2022  ? Procedure: IRRIGATION AND DEBRIDEMENT SCROTUM;  Surgeon: Raynelle Bring, MD;  Location: [place];  Service: Urology;  Laterality: N/A;  ? SCROTAL EXPLORATION N/A 03/28/2022  ? Procedure: IRRIGATION AND DEBRIDEMENT SCROTUM;  Surgeon: Ardis Hughs, MD;  Location: [place];  Service: Urology;  Laterality: N/A;  ? TONSILLECTOMY    ? TOTAL KNEE ARTHROPLASTY Bilateral 11/04/2018  ? Procedure: BILATERAL TOTAL KNEE ARTHROPLASTY;  Surgeon: [name], MD;  Location: [place];  Service: Orthopedics;  Laterality: Bilateral;  spinal/epidural per anesthesiologist  ? TRANSMETATARSAL AMPUTATION Left 03/15/2022  ? Procedure: LEFT TRANSMETATARSAL AMPUTATION;  Surgeon: [name], MD;  Location: [place];  Service: Orthopedics;  Laterality: Left;   ? ? ?Family History  ?Problem Relation Age of Onset  ? COPD Mother   ? Sarcoidosis Brother   ? Cancer Brother   ? Stomach cancer Neg Hx   ? Pancreatic cancer Neg Hx   ? Esophageal cancer Neg Hx   ? Colon cancer Neg Hx   ? Rectal cancer Neg Hx   ? ? ?Social History:  Married. Independent and working Dietitian. He reports that he has never smoked. He has never used smokeless tobacco. He reports current alcohol use. He reports that he does not use drugs. ? ? ?Allergies  ?Allergen Reactions  ? Codeine Hives  ?  Insomnia, jittery  ? Flomax [Tamsulosin Hcl] Nausea Only  ?  Nausea and fatigue  ? ? ?No medications prior to admission.  ? ?Home: ?Home Living ?Family/patient expects to be discharged to:: Skilled nursing facility ?Living Arrangements: Spouse/significant other ?Available Help at Discharge: Family, Available 24 hours/day ?Type of Home: House ?Home Access: Stairs to enter ?Entrance Stairs-Number of Steps: 5 ?Entrance Stairs-Rails: Right, Left, Can reach both ?Home Layout: Two level, Able to live on main level with bedroom/bathroom ?  Bathroom Shower/Tub: Gaffer, Tub/shower unit ?Bathroom Toilet: Handicapped height ?Bathroom Accessibility: Yes ?Home Equipment: Shower seat - built in, [employer] (2 wheels), [place] - single point ? Lives With: Spouse ?  ?Functional History: ?Prior Function ?Prior Level of Function : Independent/Modified Independent, Working/employed, Driving ?Mobility Comments: No assistive device. Works in Press photographer ?ADLs Comments: works, drives ?  ?Functional Status:  ?Mobility: ?Bed Mobility ?Overal bed mobility: Needs Assistance ?Bed Mobility: Sidelying to Sit, Sit to Sidelying ?Rolling: Modified independent (Device/Increase time) ?Sidelying to sit: Min assist, +2 for safety/equipment ?Supine to sit: Min assist ?Sit to supine: Min assist ?Sit to sidelying: Min assist ?General bed mobility comments: Able to roll with use of bedrails multiple times for pad placement,  scrotal sling adjustment. Min A to sit EOB to lift trunk and for safety, pt impulsive and returned to sidelying x3 times due to pain. Improved tolerance for long sit with sling in place. ?Transfers ?Ove

## 2022-04-03 NOTE — Progress Notes (Signed)
Advised pt will admit to CIR today. Contacted [place] to make clinic aware pt will be admitted to rehab today and will advise clinic of d/c date once known to coordinate start date. Will assist as ended.  ? ?Melven Sartorius ?Renal Navigator ?[phone] ?

## 2022-04-03 NOTE — Progress Notes (Signed)
Physical Therapy Treatment ?Patient Details ?Name: Jeffrey Ayala ?MRN: [id number] ?DOB: March 02, 1961 ?Today's Date: 04/03/2022 ? ? ?History of Present Illness Pt adm 4/2 c/o N/V/D and found to have septic shock due to scrotal cellulitis vs RLL PNA.  Pt intubated 4/3 and brief cardiac arrest post intubation. [name] started 4/5.  Pt with AKI and started on CRRT on 4/6. Pt extubated 4/10. Due to gangrene of B LE, pt underwent R BKA and L transmet amputations on 4/23. On 4/24 and 5/6, pt underwent I&D of scrotum due to dry gangrene. Also had Afib with RVR during admission. PMH - Bil TKR, migraines. ? ?  ?PT Comments  ? ? Pt received in supine, agreeable to therapy session with good participation and tolerance for bed mobility and transfer to long sitting. Pt instructed on donning/doffing of limb guard in long sitting and able to assist with process. Heavy emphasis on supine/sidelying/prone BLE/BUE exercises for strengthening, activity pacing and optimal bed posture for healing when resting. Pt needing total assist to don shrinker sock correctly to RLE. Pt reports 7/10 modified RPE (fatigue) at end of session.  ?Recommendations for follow up therapy are one component of a multi-disciplinary discharge planning process, led by the attending physician.  Recommendations may be updated based on patient status, additional functional criteria and insurance authorization. ? ?Follow Up Recommendations ? Acute inpatient rehab (3hours/day) ?  ?  ?Assistance Recommended at Discharge Frequent or constant Supervision/Assistance  ?Patient can return home with the following Two people to help with walking and/or transfers;Two people to help with bathing/dressing/bathroom;Assistance with cooking/housework;Assistance with feeding;Direct supervision/assist for medications management;Direct supervision/assist for financial management;Assist for transportation;Help with stairs or ramp for entrance ?  ?Equipment Recommendations ?  Wheelchair (measurements PT);Wheelchair cushion (measurements PT);Hospital bed;Other (comment);BSC/3in1 (mechanical lift, pending progress; drop arm BSC; slide board)  ?  ?Recommendations for Other Services   ? ? ?  ?Precautions / Restrictions Precautions ?Precautions: Fall;Other (comment) ?Precaution Comments: Contact ?Required Braces or Orthoses: Other Brace ?Other Brace: limb guard ?Restrictions ?Weight Bearing Restrictions: Yes ?RLE Weight Bearing: Non weight bearing ?LLE Weight Bearing: Non weight bearing  ?  ? ?Mobility ? Bed Mobility ?Overal bed mobility: Needs Assistance ?Bed Mobility: Supine to Sit, Rolling ?Rolling: Modified independent (Device/Increase time) ?  ?Supine to sit: Mod assist ?Sit to supine: Min assist ?  ?General bed mobility comments: Able to roll with use of bedrails multiple times modI, pt given Supervision for supine to prone positioning due to IV line and for safety. modA for pulling himself fully upright to long sit from flat bed, minA for lowering assist back to supine. MinA to pull to long sit x10 reps from elevated bed height ~30-40* ?  ? ?Transfers ?Overall transfer level: Needs assistance ?  ?  ?  ?  ?  ?  ?  ?  ?General transfer comment: defer for safety due to lack of +2 assist, emphasis instead on seated balance and bed mobility ?  ? ?Ambulation/Gait ?  ?  ?  ?  ?  ?  ?  ?  ? ? ? ? ?  ?Balance Overall balance assessment: Needs assistance ?Sitting-balance support: No upper extremity supported, Feet supported, Bilateral upper extremity supported ?Sitting balance-Leahy Scale: Poor ?Sitting balance - Comments: minA to min guard for long sitting in bed with U UE support while assisting with donning limb guard, pt min guard for safety to long sit with BUE support, can be impulsive to lean back to supine due to scrotal pain ?  Postural control: Posterior lean (to offload pain in scrotum) ?  ?   ?  ? ?  ?Cognition Arousal/Alertness: Awake/alert ?Behavior During Therapy: Impulsive, Flat  affect ?Overall Cognitive Status: Impaired/Different from baseline ?Area of Impairment: Orientation, Memory, Attention, Following commands, Safety/judgement, Awareness, Problem solving ?  ?  ?  ?  ?  ?  ?  ?  ?Orientation Level: Pt able to state month/year with slight delay and upcoming holiday, "I heard about it on the TV" and states day of week as "Thursday or Friday" ?Current Attention Level: Sustained ?Memory: Decreased recall of precautions, Decreased short-term memory ?Following Commands: Follows one step commands consistently, Follows one step commands with increased time, Follows multi-step commands inconsistently ?Safety/Judgement: Decreased awareness of safety, Decreased awareness of deficits ?Awareness: Intellectual ?Problem Solving: Slow processing, Decreased initiation, Difficulty sequencing, Requires verbal cues, Requires tactile cues ?General Comments: requires cues for safety, pt able to follow 1-step commands well this date. Improving attention to task. Pt able to assist with donning limb guard/velcro straps but limited due to poor seated balance on air bed so using one hand and therapist assisting. ?  ?  ? ?  ?Exercises Amputee Exercises ?Quad Sets: AROM, Both, 10 reps, Supine (vcs/tcs) ?Hip ABduction/ADduction: AROM, Both, 5 reps, Seated ?Knee Flexion: AROM, Both, 15 reps, Seated, Sidelying ?Knee Extension: AROM, Both, 10 reps, Seated ?Chair Push Up: AROM, 5 reps, Seated (while scooting/repositioning in chair) ?Other Exercises ?Other Exercises: sidelying RLE AROM: hip abd, hip ext (AA) x10 reps ea ?Other Exercises: prone RLE AROM: hip extension x10 reps ?Other Exercises: prone positioning x3 mins ?Other Exercises: crunches x10 reps pulling on B side rails to long sit posture ? ?  ?General Comments General comments (skin integrity, edema, etc.): HR 100-123 bpm with bed mobility ?  ?  ? ?Pertinent Vitals/Pain Pain Assessment ?Pain Assessment: Faces ?Faces Pain Scale: Hurts little more ?Pain  Location: scrotal area with long sitting position, pain improves to mild when resting/leaning to side to offload ?Pain Descriptors / Indicators: Discomfort, Grimacing ?Pain Intervention(s): Monitored during session, Repositioned  ? ? ? ?PT Goals (current goals can now be found in the care plan section) Acute Rehab PT Goals ?Patient Stated Goal: To get stronger in hospital and rehab then to go home ?PT Goal Formulation: With patient/family ?Time For Goal Achievement: 04/14/22 ?Progress towards PT goals: Progressing toward goals ? ?  ?Frequency ? ? ? Min 3X/week ? ? ? ?  ?PT Plan Current plan remains appropriate  ? ? ?   ?AM-PAC PT "6 Clicks" Mobility   ?Outcome Measure ? Help needed turning from your back to your side while in a flat bed without using bedrails?: None ?Help needed moving from lying on your back to sitting on the side of a flat bed without using bedrails?: A Lot (mod cues for safety; from flat bed) ?Help needed moving to and from a bed to a chair (including a wheelchair)?: Total (+2 maxA) ?Help needed standing up from a chair using your arms (e.g., wheelchair or bedside chair)?: Total ?Help needed to walk in hospital room?: Total ?Help needed climbing 3-5 steps with a railing? : Total ?6 Click Score: 10 ? ?  ?End of Session Equipment Utilized During Treatment: Other (comment) (limb guard RLE) ?Activity Tolerance: Patient tolerated treatment well ?Patient left: with call bell/phone within reach;in bed;with bed alarm set ?Nurse Communication: Mobility status;Need for lift equipment ?PT Visit Diagnosis: Other abnormalities of gait and mobility (R26.89);Muscle weakness (generalized) (M62.81) ?  ? ? ?Time:  1610-9604 ?PT Time Calculation (min) (ACUTE ONLY): 27 min ? ?Charges:  $Therapeutic Exercise: 8-22 mins ?$Therapeutic Activity: 8-22 mins          ?          ? ?Kennidy Lamke P., PTA ?Acute Rehabilitation Services ?Secure Chat Preferred 9a-5:30pm ?Office: [phone]  ? ? ?Roxie Kreeger M Elidia Bonenfant ?04/03/2022, 12:14 PM ? ?

## 2022-04-03 NOTE — Progress Notes (Signed)
Inpatient Rehabilitation Admission Medication Review by a Pharmacist ? ?A complete drug regimen review was completed for this patient to identify any potential clinically significant medication issues. ? ?High Risk Drug Classes Is patient taking? Indication by Medication  ?Antipsychotic Yes Seroquel for mood ?PRN Compazine for N/V  ?Anticoagulant No   ?Antibiotic Yes Po Augmentin, IV Zerbaxa to complete course for cellulitis   ?Opioid Yes Oxycodone prn pain  ?Antiplatelet No   ?Hypoglycemics/insulin No   ?Vasoactive Medication Yes Metoprolol for HR, BP  ?Chemotherapy No   ?Other Yes Allopurinol for gout ?Lanthanum, Aranesp for ESRD  ? ? ? ?Type of Medication Issue Identified Description of Issue Recommendation(s)  ?Drug Interaction(s) (clinically significant) ?    ?Duplicate Therapy ?    ?Allergy ?    ?No Medication Administration End Date ?    ?Incorrect Dose ?    ?Additional Drug Therapy Needed ?    ?Significant med changes from prior encounter (inform family/care partners about these prior to discharge).    ?Other ?    ? ? ?Clinically significant medication issues were identified that warrant physician communication and completion of prescribed/recommended actions by midnight of the next day:  No ? ?Pharmacist comments: None ? ?Time spent performing this drug regimen review (minutes):  20 minutes ? ? ?Tad Moore ?04/03/2022 1:24 PM ?

## 2022-04-03 NOTE — Progress Notes (Signed)
Patient ID: Jeffrey Ayala, male   DOB: 1961/11/19, 61 y.o.   MRN: [id number] ? ?Met with patient and spouse in room, introduced myself, and explained my role in his care. Completed skin assessment with assigned RN. Explained contents of Health Resource Notebook, explained, HD schedule while on rehab, and answered all questions with verbal understanding. Patient resting in bed with no complaints of pain. Completed all old wounds and documentation on the LDA. This RN will continue to monitor patient progress. ? ?Dorthula Nettles, RN3, BSN, CBIS, CRRN, WTA ?Care Coordinator, Inpatient Rehabilitation ?Office [phone]  ?

## 2022-04-03 NOTE — Progress Notes (Signed)
Patient ID: Jeffrey Ayala, male   DOB: Mar 07, 1961, 61 y.o.   MRN: [id number] ?[facility] ?Progress Note  ? ?Assessment/ Plan:   ?1. Acute kidney Injury: ATN from severe septic shock in the setting of foot wounds.  Has been dialysis dependent since 02/26/2022 (initially started with CRRT and transition to IHD with first tx 4/12).   ?- Transition to HD per TTS schedule to match his new outpatient schedule.  Next HD on 5/13 ?- have ordered vein mapping. They would really like to get him through rehab stay before doing an AVF   ?- We are clipping as AKI for now and watching for recovery, though may be end stage given prolonged dependence on dialysis.  He has an outpatient HD unit (as an AKI) at [facility] on TTS schedule ? ?2.  Scrotal edema/wound: Status post surgical debridement by urology on 4/24 and on 5/6 underwent irrigation and debridement of scrotum with wound closure and drain placement.  Abx per primary team.  Previously with elevated post-void residual scans which improved on last check ? ?3.  Leg wound status post left TMA and right below-knee amputation on 03/15/2022: Septic shock resolved; abx per primary team  ? ?4.  Anemia - acute blood loss: s/p PRBC's x 2 units on 5/7 (maybe 3 per nursing) and 2 units PRBC's with HD on 5/8 as well.  PRBC's on 5/8 as well.  Spoke with team on 5/9. CT abd was negative for acute process.  Hb improved on 5/8 and 5/9 s/p the PRBC's.  On aranesp 200 mcg weekly on mondays  ? ?5.  Hyperphosphatemia: Secondary to acute kidney injury.  On binders and HD.  Note Switched from sevelamer to Fosrenol.  Improved control ? ?Disposition - just moved to inpatient rehab  ? ? ?Subjective:   ?He was moved to inpatient rehab.  Last HD on 5/10 with 2.8 kg UF.  He's had trouble tolerating 20 minutes in a chair due to discomfort from his GU surgical wounds. His wife is here at bedside.  They're excited to be at rehab.   ? ?Review of systems:   ?Denies shortness of breath or  chest pain  ?Denies n/v or diarrhea ?  ? ?Objective:   ?Ht '5\' 10"'$  (1.778 m)   BMI 45.20 kg/m?  ?No intake or output data in the 24 hours ending 04/03/22 1549 ? ?Weight change:  ? ?Physical Exam:        ?General adult male in bed lying flat in no acute distress ?HEENT normocephalic atraumatic extraocular movements intact sclera anicteric ?Neck supple trachea midline ?Lungs clear to auscultation bilaterally normal work of breathing at rest  ?Heart S1S2 no rub ?Abdomen soft nontender distended/obese habitus ?Ext: Status post left TMA and right BKA   ?Psych normal mood and affect ?Neuro - awake and interactive; offers limited conversation and mostly few word responses ?Access: RIJ tunn catheter ? ?Imaging: ?VAS [place] UPPER EXT VEIN MAPPING (PRE-OP AVF) ? ?Result Date: 04/03/2022 ?[place] MAPPING Patient Name:  Jeffrey Ayala  Date of Exam:   04/03/2022 Medical Rec #: [id number]         Accession #:    [id number] Date of Birth: May 06, 1961         Patient Gender: M Patient Age:   61 years Exam Location:  [facility] Procedure:      VAS [place] UPPER EXT VEIN MAPPING (PRE-OP AVF) Referring Phys: [name] --------------------------------------------------------------------------------  Indications: Pre-access. Limitations: Patient somnolence,  patient movement, IV's, bandages Comparison Study: No prior studies. Performing Technologist: Oliver Hum RVT  Examination Guidelines: A complete evaluation includes B-mode imaging, spectral Doppler, color Doppler, and power Doppler as needed of all accessible portions of each vessel. Bilateral testing is considered an integral part of a complete examination. Limited examinations for reoccurring indications may be performed as noted. +-----------------+-------------+----------+----------------------+ Right Cephalic   Diameter (cm)Depth (cm)       Findings        +-----------------+-------------+----------+----------------------+ Shoulder             0.35         1.50                          +-----------------+-------------+----------+----------------------+ Prox upper arm       0.37        0.94                          +-----------------+-------------+----------+----------------------+ Mid upper arm        0.31        0.35                          +-----------------+-------------+----------+----------------------+ Dist upper arm       0.31        0.39                          +-----------------+-------------+----------+----------------------+ Antecubital fossa    0.43        0.36         branching        +-----------------+-------------+----------+----------------------+ Prox forearm         0.35        0.38          Thrombus        +-----------------+-------------+----------+----------------------+ Mid forearm          0.36        0.50   Thrombus and branching +-----------------+-------------+----------+----------------------+ Dist forearm         0.26        0.28         branching        +-----------------+-------------+----------+----------------------+ +-----------------+-------------+----------+---------+ Right Basilic    Diameter (cm)Depth (cm)Findings  +-----------------+-------------+----------+---------+ Shoulder             0.59        1.60             +-----------------+-------------+----------+---------+ Prox upper arm       0.43        1.50             +-----------------+-------------+----------+---------+ Mid upper arm        0.37        1.00             +-----------------+-------------+----------+---------+ Dist upper arm       0.41        0.72   branching +-----------------+-------------+----------+---------+ Antecubital fossa    0.29        0.30             +-----------------+-------------+----------+---------+ Prox forearm         0.22        0.32   branching +-----------------+-------------+----------+---------+ Mid forearm          0.20        0.26   branching  +-----------------+-------------+----------+---------+  Distal forearm       0.11        0.21             +-----------------+-------------+----------+---------+ +-----------------+-------------+----------+--------------+ Left Cephalic    Diameter (cm)Depth (cm)   Findings    +-----------------+-------------+----------+--------------+ Shoulder             0.33        1.90                  +-----------------+-------------+----------+--------------+ Prox upper arm       0.34        0.78                  +-----------------+-------------+----------+--------------+ Mid upper arm        0.32        0.46                  +-----------------+-------------+----------+--------------+ Dist upper arm       0.44        0.41                  +-----------------+-------------+----------+--------------+ Antecubital fossa    0.33        0.31     branching    +-----------------+-------------+----------+--------------+ Prox forearm         0.44        0.24     branching    +-----------------+-------------+----------+--------------+ Mid forearm          0.29        0.32                  +-----------------+-------------+----------+--------------+ Dist forearm                            not visualized +-----------------+-------------+----------+--------------+ +-----------------+-------------+----------+---------+ Left Basilic     Diameter (cm)Depth (cm)Findings  +-----------------+-------------+----------+---------+ Shoulder             0.80        1.40             +-----------------+-------------+----------+---------+ Mid upper arm        0.63        1.40             +-----------------+-------------+----------+---------+ Dist upper arm       0.61        0.45   branching +-----------------+-------------+----------+---------+ Antecubital fossa    0.50        0.53   branching +-----------------+-------------+----------+---------+ Prox forearm         0.34         0.54   Thrombus  +-----------------+-------------+----------+---------+ Mid forearm          0.30        0.40   Thrombus  +-----------------+-------------+----------+---------+ Distal forearm       0.17        0.21             +-----------------+-------------+----------+---------+ *See table(s) above for measurements and observations.  Diagnosing physician: Monica Martinez MD [place]

## 2022-04-03 NOTE — Progress Notes (Signed)
?  Inpatient Rehabilitation Admissions Coordinator  ? ?CIR bed is available to admit hm today. Patient, wife, hemodialysis, renal navigator, acute team and TOC made aware. I will make the arrangements to admit today. ? ?Danne Baxter, RN, MSN ?Rehab Admissions Coordinator ?[phone] ?04/03/2022 10:16 AM ? ? ? ?

## 2022-04-03 NOTE — Discharge Summary (Signed)
? ?Physician Discharge Summary  ?LOYDE ORTH OIN:[id number] DOB: [birth year] [name]: 02/22/2022 ? ?PCP: Patient, No Pcp Per (Inactive) ? ?Admit date: 02/22/2022 ?Discharge date: 04/03/2022 ? ?Admitted From: home ?Disposition:  CIR ? ?Recommendations for Outpatient Follow-up:  ?Follow up with Urology in 1 week ? ?Home Health: none ?Equipment/Devices: none ? ?Discharge Condition: stable ?CODE STATUS: Full code ?Diet Orders (From admission, onward)  ? ?  Start     Ordered  ? 03/28/22 1529  Diet renal with fluid restriction Fluid restriction: 1200 mL Fluid; Room service appropriate? Yes; Fluid consistency: Thin  Diet effective now       ?Question Answer Comment  ?Fluid restriction: 1200 mL Fluid   ?Room service appropriate? Yes   ?Fluid consistency: Thin   ?  ? 03/28/22 1528  ? ?  ?  ? ?  ? ? ?HPI: Per admitting MD, ?Jeffrey Ayala, is a 61 y.o. male, who presented to the [facility] ED with a chief complaint of nausea, vomiting, diarrhea. They have a pertinent past medical history of gastric ulcer, kidney stones, eosinophilic esophagitis, migraines History obtained from Tracker Mance (spouse), on 4/1 patient began complaining of feeling unwell. Wife denies sick contacts, questionable food.  Wife denies taking any meds currently.  Around 2 PM on 4/2 the patient developed nausea, vomiting, and diarrhea while at a birthday party and soiled himself. EMS was called. When EMS arrived he was found to be in A-fib with a rate of 120-160. At the ED he was found to be febrile with a Tmax of 102.9.  A code sepsis was called in the emergency department, blood cultures were drawn, 2 L of IV fluid were given, vanc and cefepime were ordered.  CT chest negative for acute process.  CT abdomen pelvis with and without contrast notable for swollen scrotum concerning for infection, colon filled with diarrhea.  Urology and cardiology were consulted by the ED.  Despite fluid resuscitation the patient developed hypotension requiring  vasopressors. ? ?Hospital Course / Discharge diagnoses: ?Principal Problem: ?  Septic shock ([place]) ?Active Problems: ?  Cardiac arrest [facility]) ?  Scrotal infection ?  Anemia ?  Gangrene of right foot ([place]) ?  Gangrene of left foot ([place]) ?  AKI (acute kidney injury) ([place]) ?  Atrial fibrillation with RVR ([place]) ?  Altered mental status ?  Diarrhea ?  Shock liver ?  Acute respiratory failure with hypoxia ([place]) ?  Cirrhosis ([place]) ?  Gout ?  Acute pain ?  Demand ischemia ([place]) ?  Thrombocytopenia ([place]) ?  Malnutrition of moderate degree ?  Pressure injury of skin ?  Encounter for central line placement ?  Severe protein-calorie malnutrition ([place]) ?  Hypotension ?  Respiratory insufficiency ?  Cellulitis ?  Obesity, Class III, BMI 40-49.9 (morbid obesity) ([place]) ?  Fournier's gangrene in male ? ? ?Hospital events ?4/3, patient required intubation and also had a brief cardiac arrest postintubation.   ?4/5, in an effort to correct vasoplegia, patient was also started on [name] ?Because of septic shock, patient's renal function worsened. ?4/6, patient was started on CRRT ?4/11, patient was extubated. ?4/14, patient was transferred out to [facility] service. ?4/23, underwent left transmetatarsal amputation and right BKA by [name] ?4/24, scrotum debridement and subsequent ICU readmission ?4/27, transfer to [facility] service ?4/29, scrotal wound culture with E. Faecalis and MDR Pseudomonas aeruginosa; ID consulted and patient transitioned to Ampicillin, Avycaz and Flagyl ?5/1, ID transitioned antibiotics to Augmentin and Zerbaxa ?5/6, I&D of scrotum  with wound closure ?5/12 discharged to CIR ? ?Sepsis ruled out ? ?Principal problem ?Septic shock ([place]) -she required ICU admission and vasopressor support.  Shock was thought to be due to scrotal infection, urology consulted and followed patient while hospitalized, underwent several surgical interventions last 1 on 5/6 by urology, and had perineal/scrotal area primarily closed.   He has been maintained on multiple antibiotics, currently on Zerbaxa and Augmentin, with plans to end on 5/13 per ID.  Culture data showing MDR pseudomonas, enterococcus faecalis, bacteroides ovalis, beta lactamase positive.  Recommendations were for 7 days following most recent surgical intervention.  Continue antibiotics as below for 1 more day ?  ?Active problems ?Gangrene of the left foot-status post left by [name] on 4/23. ?Gangrene of the right foot-status post right BKA with [name] on 4/20 ?Cardiac arrest [facility]) -"brief" cardiac arrest post intubation on 4/3. Echo with normal EF, no RWMA.  Cardiology consulted and evaluated patient, no further testing required at this point ?Anemia, acute blood loss anemia, postoperatively-status post 9 units total of packed red blood cells.  No bleeding.  Hemoglobin stable ?AKI (acute kidney injury) ([employer]) -Thought 2/2 ATN from septic shock.  Currently dialysis dependent, nephrology following, continue dialysis  ?Atrial fibrillation with RVR ([place]) -New diagnosis. CHA2DS2-VASc Score is 1 (PAD).Transthoracic Echocardiogram with preserved LVEF. Mostly rate controlled but did have some associated tachycardia while agitated. Initially was managed on heparin IV which was discontinued secondary to anemia. Cardiology consulted. Patient started on metoprolol 37.5 mg TID and aspirin 81 mg.  Continue current regimen at this time ?Acute metabolic encephalopathy -Unsure of specific etiology.  Possibly multifactorial in the setting of septic shock, uremia.  Much improved.  Patient wants to continue Seroquel at the time of discharge ?Shock liver -improved, LFTs have normalized ?Acute respiratory failure with hypoxia [facility]) -Patient intubated from 4/3 - 4/10, 4/23 - 4/23, 4/24 - 4/25. Resolved. ?Gout -Now on allopurinol ?Elevated troponin due to demand ischemia [facility]) -In setting of septic shock and cardiac arrest.  Cardiology followed ?Thrombocytopenia ([place]) -S/p 1 unit platelets.   Platelets are now normalized ?Malnutrition of moderate degree -In setting of altered mentation. Dietitian consulted. ? ?Discharge Instructions ? ?Discharge Instructions   ? ? Amb referral to AFIB Clinic   Complete by: As directed ?  ? Negative Pressure Wound Therapy - Incisional   Complete by: As directed ?  ? ?  ? ?Allergies as of 04/03/2022   ? ?   Reactions  ? Codeine Hives  ? Insomnia, jittery  ? Flomax [tamsulosin Hcl] Nausea Only  ? Nausea and fatigue  ? ?  ? ?  ?Medication List  ?  ? ?STOP taking these medications   ? ?baclofen 10 MG tablet ?Commonly known as: LIORESAL ?  ?colchicine-probenecid 0.5-500 MG tablet ?  ?fluticasone 220 MCG/ACT inhaler ?Commonly known as: FLOVENT HFA ?  ?naproxen sodium 220 MG tablet ?Commonly known as: ALEVE ?  ? ?  ? ?TAKE these medications   ? ?acetaminophen 650 MG CR tablet ?Commonly known as: TYLENOL ?Take 650 mg by mouth every 8 (eight) hours as needed for pain or fever. ?  ?allopurinol 100 MG tablet ?Commonly known as: Zyloprim ?Take 1 tablet (100 mg total) by mouth daily. ?  ?amoxicillin-clavulanate 500-125 MG tablet ?Commonly known as: AUGMENTIN ?Take 1 tablet (500 mg total) by mouth 2 (two) times daily. ?  ?aspirin 81 MG chewable tablet ?Chew 1 tablet (81 mg total) by mouth daily. ?  ?ceftolozane-tazobactam 150 mg in sodium  chloride 0.9 % 100 mL ?Inject 150 mg into the vein every 8 (eight) hours. ?  ?leptospermum manuka honey Pste paste ?Apply 1 application. topically daily. ?  ?Metoprolol Tartrate 37.5 MG Tabs ?Take 37.5 mg by mouth 3 (three) times daily. ?  ?OSCAL 500 D-3 PO ?Take 1 tablet by mouth daily. ?  ?OVER THE COUNTER MEDICATION ?Take 1 capsule by mouth daily. Prostate vitamin ?  ?oxyCODONE 5 MG immediate release tablet ?Commonly known as: Oxy IR/ROXICODONE ?Take 1 tablet (5 mg total) by mouth every 4 (four) hours as needed for moderate pain. ?  ?pantoprazole 40 MG tablet ?Commonly known as: PROTONIX ?TAKE 1 TABLET BY MOUTH TWICE A DAY ?  ?QUEtiapine 25 MG  tablet ?Commonly known as: SEROQUEL ?Take 1 tablet (25 mg total) by mouth at bedtime. ?  ? ?  ? ? Follow-up Information   ? ? [name], MD Follow up in 1 week(s).   ?Specialty: Orthopedic Surgery ?Contact information: ?[id number]

## 2022-04-03 NOTE — Progress Notes (Signed)
[name], MD  ?Physician ?Physical Medicine and Rehabilitation ?PMR Pre-admission    ?Signed ?Date of Service:  03/31/2022  3:00 PM ? Related encounter: ED to Hosp-Admission (Current) from 02/22/2022 in [facility] 4E CV SURGICAL PROGRESSIVE CARE ?  ?Signed    ?  ?Show:Clear all ?'[x]'$ Written'[x]'$ Templated'[]'$ Copied ? ?Added by: ?'[x]'$ Cristina Gong, RN'[x]'$ Ranell Patrick Clide Deutscher, MD ? ?'[]'$ Hover for details ?   ?   ?   ?   ?   ?   ?   ?   ?   ?   ?   ?   ?   ?   ?   ?   ?   ?   ?   ?   ?   ?   ?   ?   ?   ?   ?   ?   ?   ?   ?   ?   ?   ?   ?   ?   ?   ?   ?   ?   ?   ?   ?   ?   ?   ?   ?   ?   ?   ?   ?   ?   ?   ?   ?   ?   ?   ?   ?   ?   ?   ?   ?   ?   ?   ?   ?   ?   ?   ?   ?   ?   ?   ?   ?   ?   ?   ?   ?   ?   ?   ?   ?   ?   ?   ?   ?   ?   ?   ?   ?   ?   ?   ?   ?   ?   ?   ?   ?   ?   ?   ?   ?   ?   ?   ?   ?   ?   ?   ?   ?   ?   ?   ?   ?   ?   ?   ?   ?   ?   ?   ?   ?   ?   ?   ?   ?   ?   ?   ?   ?   ?   ?   ?   ?   ?   ?   ?   ?   ?   ?   ?   ?PMR Admission Coordinator Pre-Admission Assessment ?  ?Patient: Jeffrey Ayala is an 61 y.o., male ?MRN: [id number] ?DOB: [birth year] ?Height: '5\' 10"'$  (177.8 cm) ?Weight: (!) 142.9 kg ?  ?Insurance Information ?HMO:     PPO: yes     PCP:      IPA:      80/20:      OTHER:  ?PRIMARY: [place]      Policy#: [id number]      Subscriber: pt ?CM Name: Jeffrey Ayala      Phone#: [phone]     Fax#: [phone] ?Pre-Cert#: [id number] approved for 5 days 5/12 until 04/08/22  Employer:  ?Benefits:  Phone #: [phone]     Name: 5/9 ?Eff. Date: 08/23/2017     Deduct: $1000      Out of Pocket Max: $5000      Life Max: none ?CIR: 80%      SNF: 80% 100 days per year ?Outpatient: $35 co pay per visit     Co-Pay: 20 visits combined ?Home Health: 80%      Co-Pay:20% ?DME: 80%     Co-Pay: 20% ?Providers: in network ? ?SECONDARY: none ?  ?Financial Counselor:       Phone#:  ?  ?The ?Data Collection Information Summary? for patients in Inpatient Rehabilitation Facilities with  attached ?Privacy Act [place] Records? was provided and verbally reviewed with: N/A ?  ?Emergency Contact Information ?Contact Information   ?  ?  Name Relation Home Work Mobile  ?  Jeffrey Ayala   [phone]  ?  ?   ?  ?Current Medical History  ?Patient Admitting Diagnosis: Debility after septic shock ?  ?History of Present Illness: 61 year old male with history of gastric ulcer, kidney stones, bilateral knee replacements, eosinophilic esophagitis and migraines. Presented on 02/22/22 with complaints nausea , vomiting and diarrhea.Temp 102.9 and found to be in afib with RVR on arrival via EMS. ?  ?CT abdomen and pelvis showed scrotal swelling concerning for infection, colon filled with diarrhea. Hypotensive and requiring multiple vasopressors in the ICU. Required intubation and also brief cardiac arrest post intubation. Treated for septic shock. Renal function worsened and Nephrology consulted. Began on CRRT.  Underwent left transmetatarsal amputation and right BKA by Dr Sharol Given on 4/23. Scrotal debridement on 4/24. Scrotal wound culture with E. Faecalis and MDR Pseudomonas aeruginosa. ID consulted and patient treated with IV antibiotics. Eventual transition to Augment and Zerbaxa. Additional I and D of scrotum on 5/6. Continues on IHD and OP hemodialysis has been arranged.. Nephrology feels ATN secondary to septic shock. Cardiology consulted for A Fib with RVR. Began on metoprolol and ASA. Altered mental status resolving and felt secondary to critical illness, uremia and sepsis. CT head unremarkable. Began on Seroquel.  ?  ?Patient's medical record from [facility] has been reviewed by the rehabilitation admission coordinator and physician. ?  ?Past Medical History  ?    ?Past Medical History:  ?Diagnosis Date  ? Concussion    ? Eosinophilic esophagitis    ? Gastric ulcer    ? History of kidney stones    ? Migraine    ?  ?Has the patient had major surgery during 100 days  prior to admission? Yes ?  ?Family History   ?family history includes COPD in his mother; Cancer in his brother; Sarcoidosis in his brother. ?  ?Current Medications ?  ?Current Facility-Administered Medications:  ?  (feeding supplement) PROSource Plus liquid 30 mL, 30 mL, Oral, TID BM, Mariel Aloe, MD, 30 mL at 04/02/22 2206 ?  0.9 %  sodium chloride infusion (Manually program via Guardrails IV Fluids), , Intravenous, Once, Shalhoub, Sherryll Burger, MD ?  0.9 %  sodium chloride infusion (Manually program via Guardrails IV Fluids), , Intravenous, Once, [name] C, MD ?  0.9 %  sodium chloride infusion, 250 mL, Intravenous, Continuous, Ogan, Okoronkwo U, MD, Stopping previously hung infusion at 03/20/22 2050 ?  acetaminophen (TYLENOL) tablet 325-650 mg, 325-650 mg, Oral, Q6H PRN, Mariel Aloe, MD, 650 mg at 04/01/22 2225 ?  acetaminophen (TYLENOL) tablet 650 mg, 650 mg, Oral, Q6H,  Mariel Aloe, MD, 650 mg at 04/03/22 7591 ?  allopurinol (ZYLOPRIM) tablet 100 mg, 100 mg, Oral, Daily, Mariel Aloe, MD, 100 mg at 04/02/22 6384 ?  amoxicillin-clavulanate (AUGMENTIN) 500-125 MG per tablet 500 mg, 1 tablet, Oral, BID, Vu, Trung T, MD, 500 mg at 04/02/22 2206 ?  ascorbic acid (VITAMIN C) tablet 1,000 mg, 1,000 mg, Oral, Daily, Mariel Aloe, MD, 1,000 mg at 04/02/22 6659 ?  aspirin chewable tablet 81 mg, 81 mg, Oral, Daily, Mariel Aloe, MD, 81 mg at 04/02/22 0825 ?  ceftolozane-tazobactam (ZERBAXA) 150 mg in sodium chloride 0.9 % 100 mL IVPB, 150 mg, Intravenous, Q8H, Vu, Trung T, MD, Last Rate: 101.1 mL/hr at 04/02/22 1615, 150 mg at 04/02/22 1615 ?  Chlorhexidine Gluconate Cloth 2 % PADS 6 each, 6 each, Topical, Q0600, Claudia Desanctis, MD, 6 each at 04/03/22 0645 ?  Darbepoetin Alfa (ARANESP) injection 200 mcg, 200 mcg, Intravenous, Q Mon-HD, Candee Furbish, MD, 200 mcg at 03/30/22 1552 ?  feeding supplement (NEPRO CARB STEADY) liquid 237 mL, 237 mL, Oral, BID BM, Elodia Florence., MD, 237 mL at  04/01/22 1338 ?  fiber (NUTRISOURCE FIBER) 1 packet, 1 packet, Oral, BID, Mariel Aloe, MD, 1 packet at 04/02/22 2206 ?  folic acid (FOLVITE) tablet 1 mg, 1 mg, Oral, Daily, Mariel Aloe, MD, 1 mg at 04/02/22 9357 ?  guaiFENesin (ROBITUSSIN) 100 MG/5ML liquid 400 mg, 400 mg, Oral, Q8H, Elodia Florence., MD, 400 mg at 04/02/22 1315 ?  guaiFENesin-dextromethorphan (ROBITUSSIN DM) 100-10 MG/5ML syrup 15 mL, 15 mL, Oral, Q4H PRN, Mariel Aloe, MD ?  haloperidol lactate (HALDOL) injection 2 mg, 2 mg, Intravenous, Q6H PRN, Mariel Aloe, MD, 2 mg at 03/21/22 0410 ?  heparin sodium (porcine) injection 3,800 Units, 3,800 Units, Intracatheter, PRN, [name], MD, 3,800 Units at 03/25/22 1120 ?  hydrALAZINE (APRESOLINE) injection 5 mg, 5 mg, Intravenous, Q20 Min PRN, [name], MD ?  HYDROmorphone (DILAUDID) injection 1 mg, 1 mg, Intravenous, Q3H PRN, Elodia Florence., MD, 1 mg at 03/28/22 2340 ?  hydrOXYzine (ATARAX) 10 MG/5ML syrup 25 mg, 25 mg, Oral, BID PRN, Mariel Aloe, MD, 25 mg at 04/01/22 2226 ?  labetalol (NORMODYNE) injection 10 mg, 10 mg, Intravenous, Q10 min PRN, [name], MD ?  lanthanum Lucretia Kern) chewable tablet 1,000 mg, 1,000 mg, Oral, TID WC, Elmarie Shiley, MD, 1,000 mg at 04/02/22 1606 ?  leptospermum manuka honey (MEDIHONEY) paste 1 application., 1 application., Topical, Daily, [name], MD, 1 application. at 04/02/22 0814 ?  loperamide HCl (IMODIUM) 1 MG/7.5ML suspension 2 mg, 2 mg, Oral, PRN, Mariel Aloe, MD ?  melatonin tablet 3 mg, 3 mg, Oral, QHS, Mariel Aloe, MD, 3 mg at 04/02/22 2206 ?  metoprolol tartrate (LOPRESSOR) injection 2-5 mg, 2-5 mg, Intravenous, Q2H PRN, [name], MD ?  metoprolol tartrate (LOPRESSOR) tablet 37.5 mg, 37.5 mg, Oral, TID, Mariel Aloe, MD, 37.5 mg at 04/02/22 2206 ?  multivitamin (RENA-VIT) tablet 1 tablet, 1 tablet, Oral, QHS, Mariel Aloe, MD, 1 tablet at 04/02/22 2206 ?  nutrition supplement (JUVEN)  (JUVEN) powder packet 1 packet, 1 packet, Oral, BID BM, Mariel Aloe, MD, 1 packet at 04/02/22 1315 ?  ondansetron (ZOFRAN) injection 4 mg, 4 mg, Intravenous, Q6H PRN, [name], MD, 4 mg at 03/12/22 1257

## 2022-04-04 DIAGNOSIS — Z89511 Acquired absence of right leg below knee: Secondary | ICD-10-CM

## 2022-04-04 DIAGNOSIS — D62 Acute posthemorrhagic anemia: Secondary | ICD-10-CM

## 2022-04-04 DIAGNOSIS — M25561 Pain in right knee: Secondary | ICD-10-CM

## 2022-04-04 DIAGNOSIS — M109 Gout, unspecified: Secondary | ICD-10-CM

## 2022-04-04 LAB — CBC
HCT: 22.6 % — ABNORMAL LOW (ref 39.0–52.0)
Hemoglobin: 7.6 g/dL — ABNORMAL LOW (ref 13.0–17.0)
MCH: 30.8 pg (ref 26.0–34.0)
MCHC: 33.6 g/dL (ref 30.0–36.0)
MCV: 91.5 fL (ref 80.0–100.0)
Platelets: 231 10*3/uL (ref 150–400)
RBC: 2.47 MIL/uL — ABNORMAL LOW (ref 4.22–5.81)
RDW: 15.3 % (ref 11.5–15.5)
WBC: 8.5 10*3/uL (ref 4.0–10.5)
nRBC: 0 % (ref 0.0–0.2)

## 2022-04-04 LAB — RENAL FUNCTION PANEL
Albumin: 2.6 g/dL — ABNORMAL LOW (ref 3.5–5.0)
Anion gap: 12 (ref 5–15)
BUN: 42 mg/dL — ABNORMAL HIGH (ref 8–23)
CO2: 26 mmol/L (ref 22–32)
Calcium: 8.4 mg/dL — ABNORMAL LOW (ref 8.9–10.3)
Chloride: 102 mmol/L (ref 98–111)
Creatinine, Ser: 5.51 mg/dL — ABNORMAL HIGH (ref 0.61–1.24)
GFR, Estimated: 11 mL/min — ABNORMAL LOW (ref >60)
Glucose, Bld: 124 mg/dL — ABNORMAL HIGH (ref 70–99)
Phosphorus: 4.1 mg/dL (ref 2.5–4.6)
Potassium: 3.4 mmol/L — ABNORMAL LOW (ref 3.5–5.1)
Sodium: 140 mmol/L (ref 135–145)

## 2022-04-04 LAB — OCCULT BLOOD X 1 CARD TO LAB, STOOL: Fecal Occult Bld: NEGATIVE

## 2022-04-04 MED ORDER — PREDNISONE 10 MG PO TABS
10.0000 mg | ORAL_TABLET | Freq: Every day | ORAL | Status: DC
  Administered 2022-04-05 – 2022-04-07 (×3): 10 mg via ORAL
  Filled 2022-04-04 (×4): qty 1

## 2022-04-04 NOTE — Evaluation (Signed)
Physical Therapy Assessment and Plan ? ?Patient Details  ?Name: Jeffrey Ayala ?MRN: [id number] ?Date of Birth: Aug 25, 1961 ? ?PT Diagnosis: Cognitive deficits, Difficulty walking, Muscle weakness, and Pain in scrotum ?Rehab Potential: Good ?ELOS:    ? ?Today's Date: 04/04/2022 ?PT Individual Time: 1962-2297 ?PT Individual Time Calculation (min): 75 min   ? ?Hospital Problem: Principal Problem: ?  S/P BKA (below knee amputation), right ([place]) ?Active Problems: ?  Unilateral complete BKA, right, initial encounter ([place]) ? ? ?Past Medical History:  ?Past Medical History:  ?Diagnosis Date  ? Concussion   ? Eosinophilic esophagitis   ? EGD/bx by Dr. Havery Moros  ? Gastric ulcer   ? History of kidney stones   ? Intermittent dysphagia   ? Migraine   ? ?Past Surgical History:  ?Past Surgical History:  ?Procedure Laterality Date  ? AMPUTATION Right 03/15/2022  ? Procedure: RIGHT BELOW KNEE AMPUTATION;  Surgeon: [name], MD;  Location: [place];  Service: Orthopedics;  Laterality: Right;  ? CYSTOSCOPY    ? DENTAL SURGERY    ? FINGER SURGERY    ? HERNIA REPAIR    ? at age 59  ? IR FLUORO GUIDE CV LINE RIGHT  03/09/2022  ? IR PATIENT EVAL TECH 0-60 MINS  03/09/2022  ? IR US GUIDE VASC ACCESS RIGHT  03/09/2022  ? KNEE ARTHROSCOPY Left   ? LITHOTRIPSY    ? NEPHROLITHOTOMY    ? SCROTAL EXPLORATION N/A 03/16/2022  ? Procedure: IRRIGATION AND DEBRIDEMENT SCROTUM;  Surgeon: Raynelle Bring, MD;  Location: [place];  Service: Urology;  Laterality: N/A;  ? SCROTAL EXPLORATION N/A 03/28/2022  ? Procedure: IRRIGATION AND DEBRIDEMENT SCROTUM;  Surgeon: Ardis Hughs, MD;  Location: [place];  Service: Urology;  Laterality: N/A;  ? TONSILLECTOMY    ? TOTAL KNEE ARTHROPLASTY Bilateral 11/04/2018  ? Procedure: BILATERAL TOTAL KNEE ARTHROPLASTY;  Surgeon: [name], MD;  Location: [place];  Service: Orthopedics;  Laterality: Bilateral;  spinal/epidural per anesthesiologist  ? TRANSMETATARSAL AMPUTATION Left 03/15/2022  ? Procedure: LEFT  TRANSMETATARSAL AMPUTATION;  Surgeon: [name], MD;  Location: [place];  Service: Orthopedics;  Laterality: Left;  ? ? ?Assessment & Plan ?Clinical Impression: . HPI: Mang Hazelrigg. Gombos is a 61 year old male with history of eosinopilic esophagitis, PUD, migraines who was originally admitted on 02/22/22 with fever-102.9, hypoxia, N/V/D and A fib with HR in 120-160 due to sepsis. He was intubated in ED and had brief cardiac arrest with resuscitation with ACLS protocol and elevated trops felt to be due to demand ischemia from sepsis.  He was started on broad spectrum antibiotics and fluid boluses but required pressures due to persistent hypotension. Work up revealed cortical fluid filled colon seen w/diarrhea, marked scrotal thickening without abscess concerning for cellulitis, R>L hydrocele and cortical thinning of right kidney.  Dr. Alinda Money consulted and recommended monitoring and UA done revealing pyuria/hematuria as scrotum felt likely not to be the source of infection. He had worsening of respiratory status with agitation requiring intubation on 04/03 as well as multiple pressors. . Follow up CT without abscess but he developed ecchymosis with significant edema and weeping of scrotum, anasarca and mottled/cool bilateral feet/toes.   He received methylene blue (to correct vasoplegia), started on stress dose steroids, IV heparin-->bivalirudin and antibiotics changed to Zyvox and Zosyn.  Dr. Hollie Salk consulted for acute renal failure with decrease in UOP as well as hypocalcemia.  He went on to develop oliguria and was started on CRRT on 04/06. ?  ?  VVS consulted for input on BLE ischemia and recommended monitoring for demarcation due to dry gangrene.  Cardiology following for input and no plans for ischemia work, possible DCCV if A fib persistet  and ultimate plans to transition to Eliquis as able. He was weaned off pressors and extubated without difficulty 04/10.  He started developing scrotal pain  with areas of  sloughing and rise in WBC on 04/11. Scrotal edema was improving but with dry gangrenous changes of scrotum  with plans to coordinate and surgery with [name] once cleared by cardiology. He underwent Left transmetatarsal amputation and right BKA on 04/23 by [name]. He was taken back to OR on 04/23 for debridement of scrotum and post op with hypotension and agitation requiring intubation till 04/25. ABLA treated with on unit PRBC. 2 D echo showed EF 60-65% without wall abnormality and cardiology felt that [facility] not needed due to anemia, rate controlled A fib and low CHADVASc score. He tolerated extubation but continued to have confusion/agitation due to delirium. Was on tube feeds for nutritional support but cortak pulled out and he was cleared for D3 diet with aspiration precautions.  ?  ?Dr. Baxter Flattery consulted on 04/29 as scrotal cultures grew  MDRO pseudomonas, enterococcus faecalis and bacteroides ovatus.  He required repeat I & D with closure of wound 05/06  and completes 2 week course of antibiotics--> Zerbaxa and Augmentin today.  He required 2 additional units 05/07 and 05/08. CT abdomen pelvis done and was negative for source of bleeding.   Wound VAC removed and shrinkers placed on BLE--is NWB BLE.  He is felt to have endstage renal failure and has been [facility] to [facility]  TTS schedule.  BUE vein mapping completed with plans for AVF in the future (patient wanted to hold off for now). Light bleeding from scrotal wound treated with dry dressing and Dakin's discontinued?  WOC consulted for input on right elbow and RFA non healing wounds and recommended Aquacel with daily dressing changes. He continues to be limited by weakness, groin pain (working on increasing sitting time to >20 minutes)  and requires multimodal cues with increased time for processing/sequencing. CIR recommended due to functional decline. Denies phantom limb pain. ?  Patient transferred to CIR on 04/03/2022 .  ? ?Patient currently requires  total with mobility secondary to muscle weakness and muscle joint tightness, decreased cardiorespiratoy endurance, decreased attention and delayed processing, and decreased sitting balance, decreased postural control, and pain w/transfers .  Prior to hospitalization, patient was independent  with mobility and lived with Spouse in a House home.  Home access is ramp being built todayStairs to enter. ? ?Patient will benefit from skilled PT intervention to maximize safe functional mobility, minimize fall risk, and decrease caregiver burden for planned discharge home with 24 hour assist.  Anticipate patient will benefit from follow up [facility] at discharge. ? ?PT - End of Session ?Activity Tolerance: Decreased this session;Tolerates 30+ min activity with multiple rests ?Endurance Deficit: Yes ?Endurance Deficit Description: fatigues quickly w/dynamic sitting tasks, transfers, pain also limited activity tolerance ?PT Assessment ?Rehab Potential (ACUTE/IP ONLY): Good ?PT Barriers to Discharge: Home environment access/layout;Wound Care;Weight;Weight bearing restrictions ?PT Barriers to Discharge Comments: family building ramped entrance to home, ?PT Patient demonstrates impairments in the following area(s): Balance;Edema;Endurance;Motor;Pain;Safety ?PT Transfers Functional Problem(s): Bed Mobility;Bed to Chair;Car ?PT Locomotion Functional Problem(s): Wheelchair Mobility ?PT Plan ?PT Intensity: Minimum of 1-2 x/day ,45 to 90 minutes ?PT Frequency: 5 out of 7 days ?PT Treatment/Interventions: Discharge planning;Disease  management/prevention;DME/adaptive equipment instruction;Functional mobility training;Pain management;Patient/family education;Psychosocial support;Skin care/wound management;Therapeutic Activities;Therapeutic Exercise;UE/LE Strength taining/ROM;Wheelchair propulsion/positioning ?PT Transfers Anticipated Outcome(s): mod I ?PT Locomotion Anticipated Outcome(s): mod I wc household ?PT Recommendation ?Follow Up  Recommendations: Home health PT;24 hour supervision/assistance ?Patient destination: Home ?Equipment Recommended: To be determined ?Equipment Details: will need wc at dc ? ? ?PT Evaluation ?Precautions/Restrictions ?[place]

## 2022-04-04 NOTE — Progress Notes (Signed)
?                                                       PROGRESS NOTE ? ? ?Subjective/Complaints: ?Reports doing well this AM. He does complain of some pain in his ring finger on the left.  ? ?Review of Systems  ?Constitutional:  Negative for chills and fever.  ?HENT:  Negative for congestion.   ?Respiratory:  Negative for shortness of breath.   ?Cardiovascular:  Negative for chest pain and palpitations.  ?Gastrointestinal:  Negative for constipation, diarrhea, nausea and vomiting.   ?  ? ?Objective: ?  ?VAS [place] UPPER EXT VEIN MAPPING (PRE-OP AVF) ? ?Result Date: 04/03/2022 ?[place] MAPPING Patient Name:  Jeffrey Ayala  Date of Exam:   04/03/2022 Medical Rec #: [id number]         Accession #:    [id number] Date of Birth: Mar 26, 1961         Patient Gender: M Patient Age:   61 years Exam Location:  [facility] Procedure:      VAS [place] UPPER EXT VEIN MAPPING (PRE-OP AVF) Referring Phys: [name] --------------------------------------------------------------------------------  Indications: Pre-access. Limitations: Patient somnolence, patient movement, IV's, bandages Comparison Study: No prior studies. Performing Technologist: Oliver Hum RVT  Examination Guidelines: A complete evaluation includes B-mode imaging, spectral Doppler, color Doppler, and power Doppler as needed of all accessible portions of each vessel. Bilateral testing is considered an integral part of a complete examination. Limited examinations for reoccurring indications may be performed as noted. +-----------------+-------------+----------+----------------------+ Right Cephalic   Diameter (cm)Depth (cm)       Findings        +-----------------+-------------+----------+----------------------+ Shoulder             0.35        1.50                          +-----------------+-------------+----------+----------------------+ Prox upper arm       0.37        0.94                           +-----------------+-------------+----------+----------------------+ Mid upper arm        0.31        0.35                          +-----------------+-------------+----------+----------------------+ Dist upper arm       0.31        0.39                          +-----------------+-------------+----------+----------------------+ Antecubital fossa    0.43        0.36         branching        +-----------------+-------------+----------+----------------------+ Prox forearm         0.35        0.38          Thrombus        +-----------------+-------------+----------+----------------------+ Mid forearm          0.36        0.50   Thrombus and branching +-----------------+-------------+----------+----------------------+ Dist forearm  0.26        0.28         branching        +-----------------+-------------+----------+----------------------+ +-----------------+-------------+----------+---------+ Right Basilic    Diameter (cm)Depth (cm)Findings  +-----------------+-------------+----------+---------+ Shoulder             0.59        1.60             +-----------------+-------------+----------+---------+ Prox upper arm       0.43        1.50             +-----------------+-------------+----------+---------+ Mid upper arm        0.37        1.00             +-----------------+-------------+----------+---------+ Dist upper arm       0.41        0.72   branching +-----------------+-------------+----------+---------+ Antecubital fossa    0.29        0.30             +-----------------+-------------+----------+---------+ Prox forearm         0.22        0.32   branching +-----------------+-------------+----------+---------+ Mid forearm          0.20        0.26   branching +-----------------+-------------+----------+---------+ Distal forearm       0.11        0.21             +-----------------+-------------+----------+---------+  +-----------------+-------------+----------+--------------+ Left Cephalic    Diameter (cm)Depth (cm)   Findings    +-----------------+-------------+----------+--------------+ Shoulder             0.33        1.90                  +-----------------+-------------+----------+--------------+ Prox upper arm       0.34        0.78                  +-----------------+-------------+----------+--------------+ Mid upper arm        0.32        0.46                  +-----------------+-------------+----------+--------------+ Dist upper arm       0.44        0.41                  +-----------------+-------------+----------+--------------+ Antecubital fossa    0.33        0.31     branching    +-----------------+-------------+----------+--------------+ Prox forearm         0.44        0.24     branching    +-----------------+-------------+----------+--------------+ Mid forearm          0.29        0.32                  +-----------------+-------------+----------+--------------+ Dist forearm                            not visualized +-----------------+-------------+----------+--------------+ +-----------------+-------------+----------+---------+ Left Basilic     Diameter (cm)Depth (cm)Findings  +-----------------+-------------+----------+---------+ Shoulder             0.80        1.40             +-----------------+-------------+----------+---------+ Mid upper arm  0.63        1.40             +-----------------+-------------+----------+---------+ Dist upper arm       0.61        0.45   branching +-----------------+-------------+----------+---------+ Antecubital fossa    0.50        0.53   branching +-----------------+-------------+----------+---------+ Prox forearm         0.34        0.54   Thrombus  +-----------------+-------------+----------+---------+ Mid forearm          0.30        0.40   Thrombus   +-----------------+-------------+----------+---------+ Distal forearm       0.17        0.21             +-----------------+-------------+----------+---------+ *See table(s) above for measurements and observations.  Diagnosing physician: Monica Martinez MD Electronically signed by Monica Martinez MD on 04/03/2022 at 1:29:31 PM.    Final    ?No results for input(s): WBC, HGB, HCT, PLT in the last 72 hours. ?Recent Labs  ?  04/02/22 ?0141 04/03/22 ?0202  ?NA 139 137  ?K 3.8 3.7  ?CL 103 103  ?CO2 27 22  ?GLUCOSE 113* 111*  ?BUN 30* 47*  ?CREATININE 4.88* 6.73*  ?CALCIUM 8.3* 8.3*  ? ? ?Intake/Output Summary (Last 24 hours) at 04/04/2022 0819 ?Last data filed at 04/04/2022 0000 ?Gross per 24 hour  ?Intake 281 ml  ?Output 150 ml  ?Net 131 ml  ?  ? ?  ? ?Physical Exam: ?Vital Signs ?Blood pressure (!) 157/97, pulse (!) 107, temperature 98.5 ?F (36.9 ?C), resp. rate 19, height '5\' 10"'$  (1.778 m), SpO2 99 %. ? ? ? ?Physical Exam ? ? ?General: Alert and oriented x 3, No apparent distress ?HEENT: Head is normocephalic, atraumatic, PERRLA, EOMI, sclera anicteric, oral mucosa pink and moist ?Neck: Supple  ?Heart: Reg rate and rhythm.  ?Chest: CTA bilaterally without wheezes, rales, or rhonchi; no distress ?Abdomen: Soft, non-tender, non-distended, bowel sounds positive. ?Psych: Pt's affect is appropriate. Pt is cooperative ?Skin: Clean and intact without signs of breakdown ?Neuro: alert and oriented ?Extremities: Left TMA sutures in place and R BKA with staples in place ?R 4th digit with swelling at PIP ?Skin is warm and dry.  ? ?  ? ? ?Assessment/Plan: ?1. Functional deficits which require 3+ hours per day of interdisciplinary therapy in a comprehensive inpatient rehab setting. ?Physiatrist is providing close team supervision and 24 hour management of active medical problems listed below. ?Physiatrist and rehab team continue to assess barriers to discharge/monitor patient progress toward functional and medical  goals ? ?Care Tool: ? ?Bathing ?   ?   ?   ?  ?  ?Bathing assist   ?  ?  ?Upper Body Dressing/Undressing ?Upper body dressing   ?  ?   ?Upper body assist   ?   ?Lower Body Dressing/Undressing ?Lower body dressing ? ? ?   ?  ? ?  ? ?Lower body assist   ?   ? ?Toileting ?Toileting    ?Toileting assist   ?  ?  ?Transfers ?Chair/bed transfer ? ?Transfers assist ?   ? ?  ?  ?  ?Locomotion ?Ambulation ? ? ?Ambulation assist ? ?   ? ?  ?  ?   ? ?Walk 10 feet activity ? ? ?Assist

## 2022-04-04 NOTE — Evaluation (Signed)
Occupational Therapy Assessment and Plan ? ?Patient Details  ?Name: Jeffrey Ayala ?MRN: [id number] ?Date of Birth: 01/09/1961 ? ?OT Diagnosis: abnormal posture, acute pain, cognitive deficits, muscle weakness (generalized), pain in joint, and swelling of limb ?Rehab Potential: Rehab Potential (ACUTE ONLY): Good ?ELOS: 3-3.5 weeks  ? ? ? ?Today's Date: 04/04/2022 ?OT Individual Time: 0700-0800 ?OT Individual Time Calculation (min): 60 min    ?Session 2: ?1100-1200 ?60 min ? ?Hospital Problem: Principal Problem: ?  S/P BKA (below knee amputation), right ([place]) ?Active Problems: ?  Unilateral complete BKA, right, initial encounter ([place]) ? ? ?Past Medical History:  ?Past Medical History:  ?Diagnosis Date  ? Concussion   ? Eosinophilic esophagitis   ? EGD/bx by Dr. Havery Moros  ? Gastric ulcer   ? History of kidney stones   ? Intermittent dysphagia   ? Migraine   ? ?Past Surgical History:  ?Past Surgical History:  ?Procedure Laterality Date  ? AMPUTATION Right 03/15/2022  ? Procedure: RIGHT BELOW KNEE AMPUTATION;  Surgeon: [name], MD;  Location: [place];  Service: Orthopedics;  Laterality: Right;  ? CYSTOSCOPY    ? DENTAL SURGERY    ? FINGER SURGERY    ? HERNIA REPAIR    ? at age 5  ? IR FLUORO GUIDE CV LINE RIGHT  03/09/2022  ? IR PATIENT EVAL TECH 0-60 MINS  03/09/2022  ? IR US GUIDE VASC ACCESS RIGHT  03/09/2022  ? KNEE ARTHROSCOPY Left   ? LITHOTRIPSY    ? NEPHROLITHOTOMY    ? SCROTAL EXPLORATION N/A 03/16/2022  ? Procedure: IRRIGATION AND DEBRIDEMENT SCROTUM;  Surgeon: Raynelle Bring, MD;  Location: [place];  Service: Urology;  Laterality: N/A;  ? SCROTAL EXPLORATION N/A 03/28/2022  ? Procedure: IRRIGATION AND DEBRIDEMENT SCROTUM;  Surgeon: Ardis Hughs, MD;  Location: [place];  Service: Urology;  Laterality: N/A;  ? TONSILLECTOMY    ? TOTAL KNEE ARTHROPLASTY Bilateral 11/04/2018  ? Procedure: BILATERAL TOTAL KNEE ARTHROPLASTY;  Surgeon: [name], MD;  Location: [place];  Service: Orthopedics;  Laterality:  Bilateral;  spinal/epidural per anesthesiologist  ? TRANSMETATARSAL AMPUTATION Left 03/15/2022  ? Procedure: LEFT TRANSMETATARSAL AMPUTATION;  Surgeon: [name], MD;  Location: [place];  Service: Orthopedics;  Laterality: Left;  ? ? ?Assessment & Plan ?Clinical Impression:  Pt adm 4/2 c/o N/V/D and found to have septic shock due to scrotal cellulitis vs RLL PNA.  Pt intubated 4/3 and brief cardiac arrest post intubation. [name] started 4/5.  Pt with AKI and started on CRRT on 4/6. Pt extubated 4/10. Due to gangrene of B LE, pt underwent R BKA and L transmet amputations on 4/23. On 4/24 and 5/6, pt underwent I&D of scrotum due to dry gangrene. Pt also with Afib with RVR. PMH - Bil TKR, migraines ? ?Patient currently requires max with basic self-care skills secondary to muscle weakness, decreased cardiorespiratoy endurance, decreased attention, decreased awareness, decreased problem solving, decreased safety awareness, decreased memory, and delayed processing, and decreased sitting balance, decreased postural control, and decreased balance strategies.  Prior to hospitalization, patient could complete BADLIADL/vocation with independent . ? ?Patient will benefit from skilled intervention to decrease level of assist with basic self-care skills and increase independence with basic self-care skills prior to discharge home with care partner.  Anticipate patient will require 24 hour supervision and follow up home health. ? ?OT - End of Session ?Endurance Deficit: Yes ?OT Assessment ?Rehab Potential (ACUTE ONLY): Good ?OT Barriers to Discharge: Inaccessible home environment;Home  environment access/layout;Weight;Weight bearing restrictions;Incontinence ?OT Patient demonstrates impairments in the following area(s): Balance;Cognition;Endurance;Motor;Pain;Perception;Safety;Sensory;Skin Integrity ?OT Basic ADL's Functional Problem(s): Grooming;Bathing;Dressing;Toileting ?OT Transfers Functional Problem(s):  Toilet;Tub/Shower ?OT Plan ?OT Intensity: Minimum of 1-2 x/day, 45 to 90 minutes ?OT Frequency: 5 out of 7 days ?OT Duration/Estimated Length of Stay: 3-3.5 weeks ?OT Treatment/Interventions: Environmental education officer education;Therapeutic Activities;Wheelchair propulsion/positioning;Therapeutic Exercise;Psychosocial support;Cognitive remediation/compensation;Community reintegration;Functional mobility training;Self Care/advanced ADL retraining;UE/LE Strength taining/ROM;Discharge planning;Neuromuscular re-education;Skin care/wound managment;UE/LE Coordination activities;Visual/perceptual remediation/compensation;Splinting/orthotics;Pain management;Disease mangement/prevention ?OT Self Feeding Anticipated Outcome(s): no goal ?OT Basic Self-Care Anticipated Outcome(s): S ?OT Toileting Anticipated Outcome(s): MIN A ?OT Bathroom Transfers Anticipated Outcome(s): CGA ?OT Recommendation ?Patient destination: Home ?Follow Up Recommendations: Home health OT ?Equipment Recommended: 3 in 1 bedside comode;To be determined;Tub/shower seat;Tub/shower bench ?Equipment Details: DAC; TTB? ? ? ?OT Evaluation ?Precautions/Restrictions  ?Precautions ?Precautions: Fall;Other (comment) ?Precaution Comments: Contact ?Required Braces or Orthoses: Other Brace ?Other Brace: limb guard ?Restrictions ?Weight Bearing Restrictions: Yes ?RLE Weight Bearing: Non weight bearing ?LLE Weight Bearing: Non weight bearing ?Other Position/Activity Restrictions: bilat LEs ?General ?Chart Reviewed: Yes ?Family/Caregiver Present: No ?Vital Signs ? ?Pain ?Pain Assessment ?Pain Scale: 0-10 ?Pain Score: 0-No pain (at rest) ?Pain Type: Surgical pain ?Pain Location: Genitalia ?Pain Intervention(s): Repositioned ?Home Living/Prior Functioning ?Home Living ?Family/patient expects to be discharged to:: Private residence ?Living Arrangements: Spouse/significant other ?Available Help at Discharge: Family,  Available 24 hours/day ?Type of Home: House ?Home Access: Stairs to enter ?Entrance Stairs-Number of Steps: ramp being built today ?Entrance Stairs-Rails: Right, Left, Can reach both ?Home Layout: Two level, Able to live on main level with bedroom/bathroom ?Bathroom Shower/Tub: Gaffer, Tub/shower unit ?Bathroom Toilet: Handicapped height ?Bathroom Accessibility: Yes ?Additional Comments: wife instructed to obtain doorway measurements to determine wc accessibility throughout home ? Lives With: Spouse ?Prior Function ?Level of Independence: Independent with basic ADLs, Independent with homemaking with ambulation, Independent with gait ? Able to Take Stairs?: Yes ?Driving: Yes ?Vocation Requirements: salesman in [employer] ?Vision ?Baseline Vision/History: 1 Wears glasses ?Ability to See in Adequate Light: 0 Adequate ?Patient Visual Report: No change from baseline ?Vision Assessment?: No apparent visual deficits ?Perception  ?Perception: Within Functional Limits ?Praxis ?Praxis: Intact ?Cognition ?Cognition ?Overall Cognitive Status:  (delayed processing) ?Arousal/Alertness: Awake/alert ?Orientation Level: Person;Place;Situation ?Person: Oriented ?Place: Oriented ?Situation: Oriented ?Memory: Impaired ?Brief Interview for Mental Status (BIMS) ?Repetition of Three Words (First Attempt): 3 ?Temporal Orientation: Year: Correct ?Temporal Orientation: Month: Accurate within 5 days ?Temporal Orientation: Day: Correct ?Recall: "Sock": No, could not recall ?Recall: "Blue": No, could not recall ?Recall: "Bed": Yes, no cue required ?BIMS Summary Score: 11 ?Sensation ?Sensation ?Light Touch: Appears Intact ?Coordination ?Gross Motor Movements are Fluid and Coordinated: Yes ?Fine Motor Movements are Fluid and Coordinated: Yes ?Motor  ?Motor ?Motor: Abnormal postural alignment and control ?Motor - Skilled Clinical Observations: generalized deconditioning and impaired postural control d/t B amputations/scrotal  pain--pain avoidance positioning, NWB bilat LEs  ?Trunk/Postural Assessment  ?Cervical Assessment ?Cervical Assessment: Exceptions to [facility] ?Thoracic Assessment ?Thoracic Assessment: Exceptions to [facility] (rounded shoulders,post lean dom

## 2022-04-04 NOTE — Progress Notes (Signed)
Patient ID: Jeffrey Ayala, male   DOB: 18-Mar-1961, 61 y.o.   MRN: [id number] ?[name] ?Progress Note  ? ?Assessment/ Plan:   ?1. Acute kidney Injury: ATN from severe septic shock in the setting of foot wounds.  Has been dialysis dependent since 02/26/2022 (initially started with CRRT and transition to IHD with first tx 4/12).  Vein mapping done.  They would really like to get him through rehab stay before doing an AVF ?------------------- ?- HD today; we transitioned to HD per TTS schedule to match his new outpatient schedule ?- renal panel was ordered - not yet drawn ?- We are clipping as AKI for now and watching for recovery, though may be end stage given prolonged dependence on dialysis.  He has an outpatient HD unit (as an AKI) at [facility] on TTS schedule ?- before discharge will need to ensure that he is able to tolerate HD in a chair.  We haven't tried yet as sitting 20 minutes is uncomfortable after scrotal surgery  ? ?2.  Scrotal edema/wound: Status post surgical debridement by urology on 4/24 and on 5/6 underwent irrigation and debridement of scrotum with wound closure and drain placement.  Abx per primary team.  Previously with elevated post-void residual scans which improved on last check ? ?3.  Leg wound status post left TMA and right below-knee amputation on 03/15/2022: Septic shock resolved; abx per primary team  ? ?4.  Anemia - acute blood loss: s/p PRBC's x 2 units on 5/7 (maybe 3 per nursing) and 2 units PRBC's with HD on 5/8 as well.  PRBC's on 5/8 as well.  Spoke with team on 5/9. CT abd was negative for acute process.  Hb improved on 5/8 and 5/9 s/p the PRBC's.   ?- On aranesp 200 mcg weekly on mondays  ?- await updated CBC ? ?5.  Hyperphosphatemia: Secondary to acute kidney injury.  On binders and HD.  Note Switched from sevelamer to Fosrenol.  Improved control ? ?Disposition - just moved to inpatient rehab  ? ? ?Subjective:   ?Last HD on 5/10 with 2.8 kg UF - we held  yesterday due to aligning with his outpatient schedule.  Labs were not drawn this am but were ordered.  Spoke with pt and his wife at bedside.  It's getting a little easier to sit in a chair but sometimes still pretty uncomfortable  ? ?Review of systems:   ?Denies shortness of breath or chest pain   ?Denies n/v or diarrhea; initially had diarrhea but stools now normal  ?  ? ?Objective:   ?BP (!) 157/97 (BP Location: Left Arm)   Pulse (!) 107   Temp 98.5 ?F (36.9 ?C)   Resp 19   Ht '5\' 10"'$  (1.778 m)   SpO2 99%   BMI 45.20 kg/m?  ? ?Intake/Output Summary (Last 24 hours) at 04/04/2022 1244 ?Last data filed at 04/04/2022 0900 ?Gross per 24 hour  ?Intake 401 ml  ?Output 400 ml  ?Net 1 ml  ? ? ?Weight change:  ? ?Physical Exam:        ?General adult male in bed lying flat in no acute distress  ?HEENT normocephalic atraumatic extraocular movements intact sclera anicteric ?Neck supple trachea midline ?Lungs clear to auscultation bilaterally normal work of breathing at rest  ?Heart S1S2 no rub ?Abdomen soft nontender distended/obese habitus ?Ext: Status post left TMA and right BKA   ?Psych normal mood and affect ?Neuro - awake and interactive; more conversant today ?Access:  RIJ tunn catheter ? ?Imaging: ?VAS [place] UPPER EXT VEIN MAPPING (PRE-OP AVF) ? ?Result Date: 04/03/2022 ?[name] Patient Name:  Jeffrey Ayala  Date of Exam:   04/03/2022 Medical Rec #: [id number]         Accession #:    [id number] Date of Birth: 03/30/1961         Patient Gender: M Patient Age:   32 years Exam Location:  [facility] Procedure:      VAS [place] UPPER EXT VEIN MAPPING (PRE-OP AVF) Referring Phys: [name] --------------------------------------------------------------------------------  Indications: Pre-access. Limitations: Patient somnolence, patient movement, IV's, bandages Comparison Study: No prior studies. Performing Technologist: Oliver Hum RVT  Examination Guidelines: A complete evaluation includes  B-mode imaging, spectral Doppler, color Doppler, and power Doppler as needed of all accessible portions of each vessel. Bilateral testing is considered an integral part of a complete examination. Limited examinations for reoccurring indications may be performed as noted. +-----------------+-------------+----------+----------------------+ Right Cephalic   Diameter (cm)Depth (cm)       Findings        +-----------------+-------------+----------+----------------------+ Shoulder             0.35        1.50                          +-----------------+-------------+----------+----------------------+ Prox upper arm       0.37        0.94                          +-----------------+-------------+----------+----------------------+ Mid upper arm        0.31        0.35                          +-----------------+-------------+----------+----------------------+ Dist upper arm       0.31        0.39                          +-----------------+-------------+----------+----------------------+ Antecubital fossa    0.43        0.36         branching        +-----------------+-------------+----------+----------------------+ Prox forearm         0.35        0.38          Thrombus        +-----------------+-------------+----------+----------------------+ Mid forearm          0.36        0.50   Thrombus and branching +-----------------+-------------+----------+----------------------+ Dist forearm         0.26        0.28         branching        +-----------------+-------------+----------+----------------------+ +-----------------+-------------+----------+---------+ Right Basilic    Diameter (cm)Depth (cm)Findings  +-----------------+-------------+----------+---------+ Shoulder             0.59        1.60             +-----------------+-------------+----------+---------+ Prox upper arm       0.43        1.50             +-----------------+-------------+----------+---------+  Mid upper arm        0.37        1.00             +-----------------+-------------+----------+---------+  Dist upper arm       0.41        0.72   branching +-----------------+-------------+----------+---------+ Antecubital fossa    0.29        0.30             +-----------------+-------------+----------+---------+ Prox forearm         0.22        0.32   branching +-----------------+-------------+----------+---------+ Mid forearm          0.20        0.26   branching +-----------------+-------------+----------+---------+ Distal forearm       0.11        0.21             +-----------------+-------------+----------+---------+ +-----------------+-------------+----------+--------------+ Left Cephalic    Diameter (cm)Depth (cm)   Findings    +-----------------+-------------+----------+--------------+ Shoulder             0.33        1.90                  +-----------------+-------------+----------+--------------+ Prox upper arm       0.34        0.78                  +-----------------+-------------+----------+--------------+ Mid upper arm        0.32        0.46                  +-----------------+-------------+----------+--------------+ Dist upper arm       0.44        0.41                  +-----------------+-------------+----------+--------------+ Antecubital fossa    0.33        0.31     branching    +-----------------+-------------+----------+--------------+ Prox forearm         0.44        0.24     branching    +-----------------+-------------+----------+--------------+ Mid forearm          0.29        0.32                  +-----------------+-------------+----------+--------------+ Dist forearm                            not visualized +-----------------+-------------+----------+--------------+ +-----------------+-------------+----------+---------+ Left Basilic     Diameter (cm)Depth (cm)Findings   +-----------------+-------------+----------+---------+ Shoulder             0.80        1.40             +-----------------+-------------+----------+---------+ Mid upper arm        0.63        1.40             +-----------------+-------------+----------+---------+ Dist upper arm       0.61        0.45   branching +-----------------+-------------+----------+---------+ Antecubital fossa    0.5

## 2022-04-05 DIAGNOSIS — R197 Diarrhea, unspecified: Secondary | ICD-10-CM

## 2022-04-05 LAB — AEROBIC/ANAEROBIC CULTURE W GRAM STAIN (SURGICAL/DEEP WOUND): Gram Stain: NONE SEEN

## 2022-04-05 LAB — HEPATITIS B SURFACE ANTIBODY, QUANTITATIVE: Hep B S AB Quant (Post): <3.1 m[IU]/mL — ABNORMAL LOW (ref >9.9)

## 2022-04-05 MED ORDER — DARBEPOETIN ALFA 200 MCG/0.4ML IJ SOSY
200.0000 ug | PREFILLED_SYRINGE | INTRAMUSCULAR | Status: DC
  Administered 2022-04-07: 200 ug via INTRAVENOUS
  Filled 2022-04-05 (×3): qty 0.4

## 2022-04-05 NOTE — Progress Notes (Signed)
Patient ID: Jeffrey Ayala, male   DOB: 16-Nov-1961, 61 y.o.   MRN: [id number] ?[facility] ?Progress Note  ? ?Assessment/ Plan:   ?1. Acute kidney Injury: ATN from severe septic shock in the setting of foot wounds.  Has been dialysis dependent since 02/26/2022 (initially started with CRRT and transition to IHD with first tx 4/12).  Vein mapping done.  They would really like to get him through rehab stay before doing an AVF ?------------------- ?- HD per TTS schedule ?- We have clipped as AKI for now and watching for recovery, though may be end stage given prolonged dependence on dialysis.  He has an outpatient HD unit (as an AKI) at [facility] on TTS schedule ?- before discharge will need to ensure that he is able to tolerate HD in a chair.  We haven't tried yet as sitting 20 minutes is uncomfortable after scrotal surgery  ? ?2.  Scrotal edema/wound: Status post surgical debridement by urology on 4/24 and on 5/6 underwent irrigation and debridement of scrotum with wound closure and drain placement.  Abx per primary team.  Previously with elevated post-void residual scans which improved on last check ? ?3.  Leg wound status post left TMA and right below-knee amputation on 03/15/2022: Septic shock resolved; abx per primary team  ? ?4.  Anemia - acute blood loss: s/p PRBC's x 2 units on 5/7 (maybe 3 per nursing) and 2 units PRBC's with HD on 5/8 as well.  PRBC's on 5/8 as well.  Spoke with team on 5/9. CT abd was negative for acute process.  Hb improved on 5/8 and 5/9 s/p the PRBC's.   ?- On aranesp 200 mcg weekly on mondays  ? ?5.  Hyperphosphatemia: Secondary to acute kidney injury.  On binders and HD.  Note Switched from sevelamer to Fosrenol.  Improved control ? ?Disposition - just moved to inpatient rehab  ? ? ?Subjective:   ?Last HD on 5/13 with 2 kg UF.  He had 250 ml UOP over 5/13.  Sitting some - this is getting a little easier.  Spoke with his wife at bedside.  ? ?Review of systems:    ?Denies  shortness of breath or chest pain   ?Denies n/v; some diarrhea yesterday ?  ? ?Objective:   ?BP 129/81 (BP Location: Left Arm)   Pulse 96   Temp 98.8 ?F (37.1 ?C)   Resp 18   Ht '5\' 10"'$  (1.778 m)   SpO2 99%   BMI 45.20 kg/m?  ? ?Intake/Output Summary (Last 24 hours) at 04/05/2022 1637 ?Last data filed at 04/05/2022 1336 ?Gross per 24 hour  ?Intake 360 ml  ?Output 2255 ml  ?Net -1895 ml  ? ? ?Weight change:  ? ?Physical Exam:         ?General adult male in bed lying flat in no acute distress  ?HEENT normocephalic atraumatic extraocular movements intact sclera anicteric ?Neck supple trachea midline ?Lungs clear to auscultation bilaterally normal work of breathing at rest  ?Heart S1S2 no rub ?Abdomen soft nontender distended/obese habitus ?Ext: Status post left TMA and right BKA   ?Psych normal mood and affect ?Neuro - awake and interactive; more conversant today ?Access: RIJ tunn catheter ? ?Imaging: ?No results found. ? ?Labs: ?BMET ?Recent Labs  ?Lab 03/30/22 ?5465 03/31/22 ?0123 04/01/22 ?0300 04/02/22 ?0141 04/03/22 ?0202 04/04/22 ?1439  ?NA 138 138 139 139 137 140  ?K 4.2 3.8 3.9 3.8 3.7 3.4*  ?CL 104 103 103 103 103 102  ?  CO2 '24 27 25 27 22 26  '$ ?GLUCOSE 105* 106* 106* 113* 111* 124*  ?BUN 60* 28* 43* 30* 47* 42*  ?CREATININE 7.69* 4.30* 6.51* 4.88* 6.73* 5.51*  ?CALCIUM 8.2* 8.0* 8.4* 8.3* 8.3* 8.4*  ?PHOS 9.2* 4.5 5.5* 4.2 5.9* 4.1  ? ?CBC ?Recent Labs  ?Lab 03/29/22 ?2059 03/30/22 ?1156 03/30/22 ?1853 03/31/22 ?0123 04/01/22 ?0300 04/04/22 ?1441  ?WBC 6.7 7.5  --  8.9 8.4 8.5  ?NEUTROABS 4.7  --   --  6.7 6.3  --   ?HGB 6.1* 6.8* 8.9* 8.3* 8.3* 7.6*  ?HCT 19.2* 21.0* 25.7* 25.3* 25.9* 22.6*  ?MCV 91.9 90.1  --  89.1 92.2 91.5  ?PLT 187 187  --  188 210 231  ? ? ?Medications:   ? ? (feeding supplement) PROSource Plus  30 mL Oral TID BM  ? acetaminophen  650 mg Oral Q6H  ? allopurinol  100 mg Oral Daily  ? vitamin C  1,000 mg Oral Daily  ? aspirin  81 mg Oral Daily  ? Chlorhexidine Gluconate Cloth  6 each  Topical Q0600  ? [START ON 04/07/2022] darbepoetin (ARANESP) injection - DIALYSIS  200 mcg Intravenous Q Tue-HD  ? feeding supplement (NEPRO CARB STEADY)  237 mL Oral BID BM  ? fiber  1 packet Oral BID  ? folic acid  1 mg Oral Daily  ? guaiFENesin  400 mg Oral Q8H  ? lanthanum  1,000 mg Oral TID WC  ? leptospermum manuka honey  1 application. Topical Daily  ? melatonin  3 mg Oral QHS  ? metoprolol tartrate  37.5 mg Oral TID  ? multivitamin  1 tablet Oral QHS  ? nutrition supplement (JUVEN)  1 packet Oral BID BM  ? pantoprazole  40 mg Oral Daily  ? predniSONE  10 mg Oral Q breakfast  ? QUEtiapine  25 mg Oral QHS  ? sodium hypochlorite   Irrigation BID  ? ?Claudia Desanctis, MD ?04/05/2022, 4:37 PM ? ? ? ? ? ? ?

## 2022-04-05 NOTE — Progress Notes (Signed)
?                                                       PROGRESS NOTE ? ? ?Subjective/Complaints: ?Reports doing well this AM. Reports some loose Bms. ? ?Review of Systems  ?Constitutional:  Negative for chills and fever.  ?HENT:  Negative for congestion.   ?Respiratory:  Negative for shortness of breath.   ?Cardiovascular:  Negative for chest pain and palpitations.  ?Gastrointestinal:  Negative for constipation, diarrhea, nausea and vomiting.   ?  ? ?Objective: ?  ?VAS [place] UPPER EXT VEIN MAPPING (PRE-OP AVF) ? ?Result Date: 04/03/2022 ?[place] MAPPING Patient Name:  Jeffrey Ayala  Date of Exam:   04/03/2022 Medical Rec #: [id number]         Accession #:    [id number] Date of Birth: 1961-10-22         Patient Gender: M Patient Age:   61 years Exam Location:  [facility] Procedure:      VAS [place] UPPER EXT VEIN MAPPING (PRE-OP AVF) Referring Phys: [name] --------------------------------------------------------------------------------  Indications: Pre-access. Limitations: Patient somnolence, patient movement, IV's, bandages Comparison Study: No prior studies. Performing Technologist: Oliver Hum RVT  Examination Guidelines: A complete evaluation includes B-mode imaging, spectral Doppler, color Doppler, and power Doppler as needed of all accessible portions of each vessel. Bilateral testing is considered an integral part of a complete examination. Limited examinations for reoccurring indications may be performed as noted. +-----------------+-------------+----------+----------------------+ Right Cephalic   Diameter (cm)Depth (cm)       Findings        +-----------------+-------------+----------+----------------------+ Shoulder             0.35        1.50                          +-----------------+-------------+----------+----------------------+ Prox upper arm       0.37        0.94                          +-----------------+-------------+----------+----------------------+  Mid upper arm        0.31        0.35                          +-----------------+-------------+----------+----------------------+ Dist upper arm       0.31        0.39                          +-----------------+-------------+----------+----------------------+ Antecubital fossa    0.43        0.36         branching        +-----------------+-------------+----------+----------------------+ Prox forearm         0.35        0.38          Thrombus        +-----------------+-------------+----------+----------------------+ Mid forearm          0.36        0.50   Thrombus and branching +-----------------+-------------+----------+----------------------+ Dist forearm         0.26  0.28         branching        +-----------------+-------------+----------+----------------------+ +-----------------+-------------+----------+---------+ Right Basilic    Diameter (cm)Depth (cm)Findings  +-----------------+-------------+----------+---------+ Shoulder             0.59        1.60             +-----------------+-------------+----------+---------+ Prox upper arm       0.43        1.50             +-----------------+-------------+----------+---------+ Mid upper arm        0.37        1.00             +-----------------+-------------+----------+---------+ Dist upper arm       0.41        0.72   branching +-----------------+-------------+----------+---------+ Antecubital fossa    0.29        0.30             +-----------------+-------------+----------+---------+ Prox forearm         0.22        0.32   branching +-----------------+-------------+----------+---------+ Mid forearm          0.20        0.26   branching +-----------------+-------------+----------+---------+ Distal forearm       0.11        0.21             +-----------------+-------------+----------+---------+ +-----------------+-------------+----------+--------------+ Left Cephalic    Diameter  (cm)Depth (cm)   Findings    +-----------------+-------------+----------+--------------+ Shoulder             0.33        1.90                  +-----------------+-------------+----------+--------------+ Prox upper arm       0.34        0.78                  +-----------------+-------------+----------+--------------+ Mid upper arm        0.32        0.46                  +-----------------+-------------+----------+--------------+ Dist upper arm       0.44        0.41                  +-----------------+-------------+----------+--------------+ Antecubital fossa    0.33        0.31     branching    +-----------------+-------------+----------+--------------+ Prox forearm         0.44        0.24     branching    +-----------------+-------------+----------+--------------+ Mid forearm          0.29        0.32                  +-----------------+-------------+----------+--------------+ Dist forearm                            not visualized +-----------------+-------------+----------+--------------+ +-----------------+-------------+----------+---------+ Left Basilic     Diameter (cm)Depth (cm)Findings  +-----------------+-------------+----------+---------+ Shoulder             0.80        1.40             +-----------------+-------------+----------+---------+ Mid upper arm        0.63  1.40             +-----------------+-------------+----------+---------+ Dist upper arm       0.61        0.45   branching +-----------------+-------------+----------+---------+ Antecubital fossa    0.50        0.53   branching +-----------------+-------------+----------+---------+ Prox forearm         0.34        0.54   Thrombus  +-----------------+-------------+----------+---------+ Mid forearm          0.30        0.40   Thrombus  +-----------------+-------------+----------+---------+ Distal forearm       0.17        0.21              +-----------------+-------------+----------+---------+ *See table(s) above for measurements and observations.  Diagnosing physician: Monica Martinez MD Electronically signed by Monica Martinez MD on 04/03/2022 at 1:29:31 PM.    Final    ?Recent Labs  ?  04/04/22 ?1441  ?WBC 8.5  ?HGB 7.6*  ?HCT 22.6*  ?PLT 231  ? ?Recent Labs  ?  04/03/22 ?0202 04/04/22 ?1439  ?NA 137 140  ?K 3.7 3.4*  ?CL 103 102  ?CO2 22 26  ?GLUCOSE 111* 124*  ?BUN 47* 42*  ?CREATININE 6.73* 5.51*  ?CALCIUM 8.3* 8.4*  ? ? ? ?Intake/Output Summary (Last 24 hours) at 04/05/2022 0740 ?Last data filed at 04/04/2022 2105 ?Gross per 24 hour  ?Intake 380 ml  ?Output 2255 ml  ?Net -1875 ml  ? ?  ? ?  ? ?Physical Exam: ?Vital Signs ?Blood pressure (!) 152/82, pulse (!) 105, temperature 98.7 ?F (37.1 ?C), resp. rate 18, height '5\' 10"'$  (1.778 m), SpO2 97 %. ? ? ? ?Physical Exam ? ? ?General: Alert and oriented x 3, No apparent distress ?HEENT: Head is normocephalic, atraumatic, PERRLA, EOMI, sclera anicteric, oral mucosa pink and moist ?Neck: Supple  ?Heart: Reg rate and rhythm.  ?Chest: CTA bilaterally without wheezes, rales, or rhonchi; no distress ?Abdomen: Soft, non-tender, non-distended, bowel sounds positive. ?Psych: Pt's affect is appropriate. Pt is cooperative ?Skin: Clean and intact without signs of breakdown ?Neuro: alert and oriented ?Extremities: Left TMA sutures in place and R BKA with staples in place, no signs of infection  ?R 4th digit with swelling at PIP ?Skin is warm and dry.  ? ?  ? ? ?Assessment/Plan: ?1. Functional deficits which require 3+ hours per day of interdisciplinary therapy in a comprehensive inpatient rehab setting. ?Physiatrist is providing close team supervision and 24 hour management of active medical problems listed below. ?Physiatrist and rehab team continue to assess barriers to discharge/monitor patient progress toward functional and medical goals ? ?Care Tool: ? ?Bathing ?   ?Body parts bathed by patient: Right arm,  Left arm, Chest, Abdomen, Right upper leg, Left upper leg, Face  ? Body parts bathed by helper: Front perineal area, Buttocks, Left lower leg ?Body parts n/a: Right lower leg ?  ?Bathing assist Assist Level: Moderate Assistance - Patient 50 - 74% ?  ?  ?Upper Body Dressing/Undressing ?Upper body dressing   ?What is the patient wearing?: Pull over shirt ?   ?Upper body assist Assist Level: Mini

## 2022-04-06 DIAGNOSIS — D62 Acute posthemorrhagic anemia: Secondary | ICD-10-CM | POA: Diagnosis not present

## 2022-04-06 DIAGNOSIS — N186 End stage renal disease: Secondary | ICD-10-CM | POA: Diagnosis not present

## 2022-04-06 DIAGNOSIS — Z89511 Acquired absence of right leg below knee: Secondary | ICD-10-CM | POA: Diagnosis not present

## 2022-04-06 DIAGNOSIS — I739 Peripheral vascular disease, unspecified: Secondary | ICD-10-CM | POA: Diagnosis not present

## 2022-04-06 DIAGNOSIS — Z992 Dependence on renal dialysis: Secondary | ICD-10-CM

## 2022-04-06 LAB — RENAL FUNCTION PANEL
Albumin: 2.6 g/dL — ABNORMAL LOW (ref 3.5–5.0)
Anion gap: 13 (ref 5–15)
BUN: 62 mg/dL — ABNORMAL HIGH (ref 8–23)
CO2: 21 mmol/L — ABNORMAL LOW (ref 22–32)
Calcium: 8.2 mg/dL — ABNORMAL LOW (ref 8.9–10.3)
Chloride: 105 mmol/L (ref 98–111)
Creatinine, Ser: 8.15 mg/dL — ABNORMAL HIGH (ref 0.61–1.24)
GFR, Estimated: 7 mL/min — ABNORMAL LOW (ref >60)
Glucose, Bld: 134 mg/dL — ABNORMAL HIGH (ref 70–99)
Phosphorus: 6.7 mg/dL — ABNORMAL HIGH (ref 2.5–4.6)
Potassium: 4 mmol/L (ref 3.5–5.1)
Sodium: 139 mmol/L (ref 135–145)

## 2022-04-06 LAB — CBC
HCT: 22.5 % — ABNORMAL LOW (ref 39.0–52.0)
Hemoglobin: 7.1 g/dL — ABNORMAL LOW (ref 13.0–17.0)
MCH: 29.5 pg (ref 26.0–34.0)
MCHC: 31.6 g/dL (ref 30.0–36.0)
MCV: 93.4 fL (ref 80.0–100.0)
Platelets: 240 10*3/uL (ref 150–400)
RBC: 2.41 MIL/uL — ABNORMAL LOW (ref 4.22–5.81)
RDW: 16.2 % — ABNORMAL HIGH (ref 11.5–15.5)
WBC: 7.5 10*3/uL (ref 4.0–10.5)
nRBC: 0 % (ref 0.0–0.2)

## 2022-04-06 MED ORDER — SODIUM CHLORIDE 0.9 % IV SOLN: 100.0000 mL | INTRAVENOUS | Status: DC | PRN

## 2022-04-06 MED ORDER — HEPARIN SODIUM (PORCINE) 1000 UNIT/ML DIALYSIS: 20.0000 [IU]/kg | INTRAMUSCULAR | Status: DC | PRN

## 2022-04-06 MED ORDER — HEPARIN SODIUM (PORCINE) 1000 UNIT/ML DIALYSIS
1000.0000 [IU] | INTRAMUSCULAR | Status: DC | PRN
  Filled 2022-04-06: qty 1

## 2022-04-06 MED ORDER — CHLORHEXIDINE GLUCONATE CLOTH 2 % EX PADS
6.0000 | MEDICATED_PAD | Freq: Every day | CUTANEOUS | Status: DC
  Administered 2022-04-06 – 2022-04-08 (×3): 6 via TOPICAL

## 2022-04-06 MED ORDER — LIDOCAINE-PRILOCAINE 2.5-2.5 % EX CREA: 1.0000 "application " | TOPICAL_CREAM | CUTANEOUS | Status: DC | PRN

## 2022-04-06 MED ORDER — ALTEPLASE 2 MG IJ SOLR: 2.0000 mg | Freq: Once | INTRAMUSCULAR | Status: DC | PRN

## 2022-04-06 MED ORDER — PENTAFLUOROPROP-TETRAFLUOROETH EX AERO: 1.0000 "application " | INHALATION_SPRAY | CUTANEOUS | Status: DC | PRN

## 2022-04-06 MED ORDER — LIDOCAINE HCL (PF) 1 % IJ SOLN: 5.0000 mL | INTRAMUSCULAR | Status: DC | PRN

## 2022-04-06 NOTE — IPOC Note (Signed)
Overall Plan of Care (IPOC) ?Patient Details ?Name: Jeffrey Ayala ?MRN: [id number] ?DOB: 16-May-1961 ? ?Admitting Diagnosis: S/P BKA (below knee amputation), right ([place]) ? ?Hospital Problems: Principal Problem: ?  S/P BKA (below knee amputation), right ([facility]) ?Active Problems: ?  Unilateral complete BKA, right, initial encounter ([place]) ?  Arthralgia of right lower leg ?  Acute blood loss anemia ? ? ? ? Functional Problem List: ?Nursing Bladder, Bowel, Edema, Endurance, Medication Management, Motor, Pain, Safety, Sensory, Skin Integrity  ?PT Balance, Edema, Endurance, Motor, Pain, Safety  ?OT Balance, Cognition, Endurance, Motor, Pain, Perception, Safety, Sensory, Skin Integrity  ?SLP    ?TR    ?    ? Basic ADL?s: ?OT Grooming, Bathing, Dressing, Toileting  ? ?  Advanced  ADL?s: ?OT    ?   ?Transfers: ?PT Bed Mobility, Bed to Chair, Car  ?OT Toilet, Tub/Shower  ? ?  Locomotion: ?PT Wheelchair Mobility  ? ?  Additional Impairments: ?OT    ?SLP   ?  ?   ?TR    ? ? ?Anticipated Outcomes ?Item Anticipated Outcome  ?Self Feeding no goal  ?Swallowing ?   ?  ?Basic self-care ? S  ?Toileting ? S ?  ?Bathroom Transfers S  ?Bowel/Bladder ? min assist  ?Transfers ? mod I  ?Locomotion ? mod I wc household  ?Communication ?    ?Cognition ?    ?Pain ? < 3  ?Safety/Judgment ? min assist  ? ?Therapy Plan: ?PT Intensity: Minimum of 1-2 x/day ,45 to 90 minutes ?PT Frequency: 5 out of 7 days ?OT Intensity: Minimum of 1-2 x/day, 45 to 90 minutes ?OT Frequency: 5 out of 7 days ?OT Duration/Estimated Length of Stay: 3-3.5 weeks ?  ? ? Team Interventions: ?Nursing Interventions Patient/Family Education, Bladder Management, Bowel Management, Disease Management/Prevention, Pain Management, Medication Management, Skin Care/Wound Management, Discharge Planning, Psychosocial Support  ?PT interventions Discharge planning, Disease management/prevention, DME/adaptive equipment instruction, Functional mobility training, Pain management,  Patient/family education, Psychosocial support, Skin care/wound management, Therapeutic Activities, Therapeutic Exercise, UE/LE Strength taining/ROM, Wheelchair propulsion/positioning  ?OT Interventions Training and development officer, DME/adaptive equipment instruction, Patient/family education, Therapeutic Activities, Wheelchair propulsion/positioning, Therapeutic Exercise, Psychosocial support, Cognitive remediation/compensation, Community reintegration, Functional mobility training, Self Care/advanced ADL retraining, UE/LE Strength taining/ROM, Discharge planning, Neuromuscular re-education, Skin care/wound managment, UE/LE Coordination activities, Visual/perceptual remediation/compensation, Splinting/orthotics, Pain management, Disease mangement/prevention  ?SLP Interventions    ?TR Interventions    ?SW/CM Interventions Discharge Planning, Psychosocial Support, Patient/Family Education  ? ?Barriers to Discharge ?MD  Medical stability  ?Nursing Decreased caregiver support, Home environment access/layout, Incontinence, Wound Care, Lack of/limited family support, Weight, Hemodialysis, Weight bearing restrictions ?2 level, 5 steps, 2 rails. Bedroom/bathroom main level. Spouse works from home, can Dentist. New hemodialysis.  ?PT Home environment access/layout, Wound Care, Weight, Weight bearing restrictions ?family building ramped entrance to home,  ?OT Inaccessible home environment, Home environment access/layout, Weight, Weight bearing restrictions, Incontinence ?   ?SLP   ?   ?SW   ?   ? ?Team Discharge Planning: ?Destination: PT-Home ,OT- Home , SLP-  ?Projected Follow-up: PT-Home health PT, 24 hour supervision/assistance, OT-  Home health OT, SLP-  ?Projected Equipment Needs: PT-To be determined, OT- 3 in 1 bedside comode, To be determined, Tub/shower seat, Tub/shower bench, SLP-  ?Equipment Details: PT-will need wc at dc, OT-DAC; TTB? ?Patient/family involved in discharge planning: PT- Patient, Family  member/caregiver,  OT-Patient, Family member/caregiver, SLP-  ? ?MD ELOS: 2-3 weeks ?Medical Rehab Prognosis:  Good ?Assessment: The patient has been  admitted for CIR therapies with the diagnosis of left TMA, Right BKA. The team will be addressing functional mobility, strength, stamina, balance, safety, adaptive techniques and equipment, self-care, bowel and bladder mgt, patient and caregiver education, pain mgt, pre-prosthetic ed, ego support, community reentry. Goals have been set at supervision to mod I with mobility and self-care at w/c level. Anticipated discharge destination is home with wife. ? ? ?   ? ? ?See Team Conference Notes for weekly updates to the plan of care  ?

## 2022-04-06 NOTE — Progress Notes (Signed)
Physical Therapy Session Note ? ?Patient Details  ?Name: Jeffrey Ayala ?MRN: [id number] ?Date of Birth: [birth year] ? ?Today's Date: 04/06/2022 ?PT Individual Time: 8469-6295 and 1415-1535 ?PT Individual Time Calculation (min): 75 min and 80 min ? ?Short Term Goals: ?Week 1:  PT Short Term Goal 1 (Week 1): supine to/from sit w/min assist ?PT Short Term Goal 2 (Week 1): sliding board transfer wc to/from mat w/mod assist of 2 ?PT Short Term Goal 3 (Week 1): Pt will tolerate 2 hr oob in wc and direct care for pressure relief as needed. ?PT Short Term Goal 4 (Week 1): Pt will propel standard wc 82f w/supervision. ? ?Skilled Therapeutic Interventions/Progress Updates:  ?   ?Session 1: ?Patient in TIS w/c upon PT arrival. Patient alert and agreeable to PT session. Patient reported 5/10 R finger and L foot pain during session, RN made aware. PT provided repositioning, rest breaks, and distraction as pain interventions throughout session.  ? ?Patient's wife asked about sutures and staples at B lower extremity incision sites. PT removed shrinker socks, B incision sites heeling well without drainage, small areas of scabbing on R residual limb, no increased erythema, edema, or change in temperature, RN made aware of wife's inquiry. Donned B shrinker socks with total A, patient tolerated well.  ? ?Patient with poor STM and recall of new information, requires his wife to answer questions about his hospital course, LTM appears intact. Will discuss possible need for SLP evaluation with team.  ? ?Focused session on transfers training, lower extremity strengthening/ROM and initiation of wheelchair mobility and education.  ? ?Therapeutic Activity: ?Bed Mobility: Patient performed supine to/from sit with min A-supervision on a mat table and sit to supine with supervision in a flat bed. Provided verbal cues for transitioning through side-lying for use of bottom elbow to push up to sitting and trunk control for safety when transitioning  to lying, dove into lying following tranfers x2..Marland Kitchen?Transfers: Patient performed slide board transfers TIS w/c>mat table>20x18 w/c>mat table>18x18 w/c>deflated bed with CGA-supervision, light min A getting back to bed due to pliable surface and total A progressing to mod A for board placement. Provided cues for hand placement, board placement, and head-hips relationship for proper technique and decreased assist with transfers. Encouraged increased hip clearance to prevent sheeting to scrotum due to wound.  ? ?Wheelchair Mobility:  ?Patient propelled wheelchair >120 feet with supervision and increased time effort due to decreased strength/activity tolerance. Provided verbal cues for turning technique, use of breaks, and demonstration and assist for management of leg rests.  ? ?Therapeutic Exercise: ?Patient performed the following exercises with verbal and tactile cues for proper technique. ?-B quad sets 2x10 with 5 sec hold ?-B SLR 2x10 ?-B prone hip extension with knee flexion ?-B prone hamstring curls ? ?Discussed home set-up, patient's wife reports the ramp is built and the doors are 32" wide, asked for bed and car height measurements as well, planning to use sedan for transport. Discussed POC and patient's progress with plans to set d/c date tomorrow during team conference. Patient and his wife appreciative.  ? ?Patient sitting up in bed with his wife at bedside at end of session with breaks locked, bed alarm set, and all needs within reach.  ? ?Session 2: ?Patient in bed upon PT arrival. Patient alert and agreeable to PT session. Patient denied pain during session. ? ?Patient finished with voiding using the urinal with NT ~10 min prior, lying in bed with mesh underwear holding a saturated dressing with  sanguinous drainage. Patient's wife reports this level of drainage has been consistent for the past week, asked if urology has been made aware. RN made aware of the dressing and his wife's questions. Obtained  supplies to change dressing with RN assisting. Removed soiled dressing and underwear, cleaned around wound site, and reapplied abdominal pad and clean underwear per nursing orders. Noted drainage to be coming from the most posterior side of the incision to his scrotum.  ? ?Therapeutic Activity: ?Bed Mobility: Patient performed rolling R/L x2 during dressing change and x1 to don shorts with supervision and use of bed rails. He performed supine to/from sit with supervision with use of bed rail in a flat bed. Provided verbal cues as above. Patient with sudden onset of dizziness/light headedness upon sitting up, vitals WFL. Resolved <2 min in sitting.  ?Transfers: Patient performed a slide board transfer bed<>w/c with CGA-min A with increased challenge lifting his hips with the air mattress and due to fatigue, required total A for board placement. Provided cues for hand placement, board placement, and head-hips relationship for proper technique and decreased assist with transfers.  ? ?Patient transported outside the atrium for therapeutic benefit, as patient has not been outside since admission. Patient grateful and tearful once outside. Discussed current mobility limitations, expectations for progression with mobility with prosthetics and w/c, long term complications and limitations vs opportunities post amputation and with HD. Showed patient various types of prosthetics and discussed activities performed by people with use of prosthetics including hiking, camping, and driving. Patient and his wife very appreciative of discussion and patient with goals to be as independent as possible. Patient returned to the room with improved affect, but still fatigued. Returned to bed as above. ? ?Patient in bed handed off to RN for continued dressing change at end of session.  ? ?Therapy Documentation ?Precautions:  ?Precautions ?Precautions: Fall, Other (comment) ?Precaution Comments: Contact ?Required Braces or Orthoses: Other  Brace ?Other Brace: limb guard ?Restrictions ?Weight Bearing Restrictions: Yes ?RLE Weight Bearing: Non weight bearing ?LLE Weight Bearing: Non weight bearing ?Other Position/Activity Restrictions: bilat LEs ? ? ? ?Therapy/Group: Individual Therapy ? ?Doreene Burke PT, DPT ? ?04/06/2022, 6:07 PM  ?

## 2022-04-06 NOTE — Progress Notes (Signed)
[facility] ?NEPHROLOGY PROGRESS NOTE ? ?Assessment/ Plan: ? ?# Acute kidney Injury: ATN from severe septic shock in the setting of foot wounds. He has been on dialysis dependent since 02/26/2022 (initially started with CRRT and transition to IHD with first tx 4/12).  Vein mapping done.  They would really like to get him through rehab stay before doing an AVF ?- HD per TTS schedule, next HD tomorrow. ?- We have clipped as AKI for now and watching for recovery, though may be end stage given prolonged dependence on dialysis.  He has an outpatient HD unit (as an AKI) at [facility] on TTS schedule ?- before discharge will need to ensure that he is able to tolerate HD in a chair.   ? ?# Scrotal edema/wound: Status post surgical debridement by urology on 4/24 and on 5/6 underwent I&D of scrotum with wound closure and drain placement.  Abx per primary team.  ? ?# Leg wound status post left TMA and right below-knee amputation on 03/15/2022: Septic shock resolved.  ?  ?# Anemia - acute blood loss: s/p PRBC's multiple units. CT abd was negative for acute process.  on Aranesp, monitor Hb.  ? ?# CKD-MBD: ca, phos ok. On Fosrenol. ? ?Subjective: Seen and examined in inpatient rehab.  He is working with physical therapist.  Denies nausea, vomiting, chest pain or shortness of breath.  I also met his wife in his room. ?Objective ?Vital signs in last 24 hours: ?Vitals:  ? 04/05/22 1327 04/05/22 2027 04/06/22 0533 04/06/22 0918  ?BP: 129/81 138/83 (!) 142/94 (!) 144/90  ?Pulse: 96 89 90 84  ?Resp: _0 ?Temp: 98.8 ?F (37.1 ?C) 98.8 ?F (37.1 ?C) 98.2 ?F (36.8 ?C)   ?TempSrc:  Oral    ?SpO2: 99% 99% 96%   ?Height:      ? ?Weight change:  ? ?Intake/Output Summary (Last 24 hours) at 04/06/2022 1052 ?Last data filed at 04/06/2022 0700 ?Gross per 24 hour  ?Intake 220 ml  ?Output 550 ml  ?Net -330 ml  ? ? ? ? ? ?Labs: ?Basic Metabolic Panel: ?Recent Labs  ?Lab 04/02/22 ?0141 04/03/22 ?0202 04/04/22 ?1439  ?NA 139 137 140   ?K 3.8 3.7 3.4*  ?CL 103 103 102  ?CO2 _1 ?GLUCOSE 113* 111* 124*  ?BUN 30* 47* 42*  ?CREATININE 4.88* 6.73* 5.51*  ?CALCIUM 8.3* 8.3* 8.4*  ?PHOS 4.2 5.9* 4.1  ? ?Liver Function Tests: ?Recent Labs  ?Lab 03/31/22 ?0123 04/01/22 ?0300 04/02/22 ?0141 04/03/22 ?0202 04/04/22 ?1439  ?AST 24 26  --   --   --   ?ALT 13 12  --   --   --   ?ALKPHOS 75 81  --   --   --   ?BILITOT 1.0 1.5*  --   --   --   ?PROT 6.4* 6.7  --   --   --   ?ALBUMIN 2.3* 2.5* 2.3* 2.4* 2.6*  ? ?No results for input(s): LIPASE, AMYLASE in the last 168 hours. ?No results for input(s): AMMONIA in the last 168 hours. ?CBC: ?Recent Labs  ?Lab 03/30/22 ?1156 03/30/22 ?1853 03/31/22 ?0123 04/01/22 ?0300 04/04/22 ?1441  ?WBC 7.5  --  8.9 8.4 8.5  ?NEUTROABS  --   --  6.7 6.3  --   ?HGB 6.8*   < > 8.3* 8.3* 7.6*  ?HCT 21.0*   < > 25.3* 25.9* 22.6*  ?MCV 90.1  --  89.1 92.2 91.5  ?PLT  187  --  188 210 231  ? < > = values in this interval not displayed.  ? ?Cardiac Enzymes: ?No results for input(s): CKTOTAL, CKMB, CKMBINDEX, TROPONINI in the last 168 hours. ?CBG: ?Recent Labs  ?Lab 04/02/22 ?1135 04/02/22 ?1554 04/02/22 ?2107 04/03/22 ?5361 04/03/22 ?1131  ?GLUCAP 146* 135* 144* 109* 116*  ? ? ?Iron Studies: No results for input(s): IRON, TIBC, TRANSFERRIN, FERRITIN in the last 72 hours. ?Studies/Results: ?No results found. ? ?Medications: ?Infusions: ? ? ?Scheduled Medications: ? (feeding supplement) PROSource Plus  30 mL Oral TID BM  ? acetaminophen  650 mg Oral Q6H  ? allopurinol  100 mg Oral Daily  ? vitamin C  1,000 mg Oral Daily  ? aspirin  81 mg Oral Daily  ? Chlorhexidine Gluconate Cloth  6 each Topical Q0600  ? [START ON 04/07/2022] darbepoetin (ARANESP) injection - DIALYSIS  200 mcg Intravenous Q Tue-HD  ? feeding supplement (NEPRO CARB STEADY)  237 mL Oral BID BM  ? folic acid  1 mg Oral Daily  ? guaiFENesin  400 mg Oral Q8H  ? lanthanum  1,000 mg Oral TID WC  ? leptospermum manuka honey  1 application. Topical Daily  ? melatonin  3 mg  Oral QHS  ? metoprolol tartrate  37.5 mg Oral TID  ? multivitamin  1 tablet Oral QHS  ? nutrition supplement (JUVEN)  1 packet Oral BID BM  ? pantoprazole  40 mg Oral Daily  ? predniSONE  10 mg Oral Q breakfast  ? QUEtiapine  25 mg Oral QHS  ? sodium hypochlorite   Irrigation BID  ? ? have reviewed scheduled and prn medications. ? ?Physical Exam: ?General:NAD, comfortable ?Heart:RRR, s1s2 nl ?Lungs:clear b/l, no crackle ?Abdomen:soft, Non-tender, non-distended ?Extremities:No edema, left TMA and right BKA. ?Dialysis Access: Right IJ TDC. ? ?[place] ?04/06/2022,10:52 AM ? LOS: 3 days  ? ?

## 2022-04-06 NOTE — Progress Notes (Signed)
Occupational Therapy Session Note ? ?Patient Details  ?Name: Jeffrey Ayala ?MRN: [id number] ?Date of Birth: [birth year] ? ?Today's Date: 04/06/2022 ?OT Individual Time: [phone] ?OT Individual Time Calculation (min): 55 min  ? ? ?Short Term Goals: ?Week 1:  OT Short Term Goal 1 (Week 1): Pt will compelte SB to w/c with 1 caregiver in prep for toileting ?OT Short Term Goal 2 (Week 1): Pt will don pants at bed level with MINA ?OT Short Term Goal 3 (Week 1): Pt will lateral lean in B directions with MIN A in prep for toilet/LB dressign tasks ?OT Short Term Goal 4 (Week 1): Pt will sit EOB/EOM dyanmically with supervision for 10 min and no LOB to demo imrpoved trunk control ? ?Skilled Therapeutic Interventions/Progress Updates:  ?  Pt received supine with 5/10 pain in his R 4th finger d/ gout causing inflammation at the PIP joint. Discussed use of ice and compression and pt agreeable at end of session. He completed bed mobility to EOB with CGA. He was cued to change shrinker on his RLE and he was able to do so with cueing only, no physical assist. Incision to the RLE looked great with no drainage. He completed a lateral lean for SB placement with CGA. Mod A for slideboard transfer with min cueing for hand placement. He completed oral care seated at the sink with set up assist. He was taken to the therapy gym via w/c. He completed another slideboard transfer to the mat with only min A. He completed several core stabilization exericses with cueing/facilitation for transverse abdominis activation to reduce coning/herniation through the center of his core. 1x6 repetitions d/t fatigue with min A required to sit flex trunk from semi reclined position. He transferred back to the w/c with min A via slideboard. His R 4th finger was wrapped to provide compression to the joint and pt provided with an ice pack. Pt and his wife verbalized understanding of 20/20 ice use. He was left sitting up in the TIS w/c with it reclined back to  provide relief to scrotal pain. Wife present, all needs met.  ? ? ?Therapy Documentation ?Precautions:  ?Precautions ?Precautions: Fall, Other (comment) ?Precaution Comments: Contact ?Required Braces or Orthoses: Other Brace ?Other Brace: limb guard ?Restrictions ?Weight Bearing Restrictions: Yes ?RLE Weight Bearing: Non weight bearing ?LLE Weight Bearing: Non weight bearing ?Other Position/Activity Restrictions: bilat LEs ? ?Therapy/Group: Individual Therapy ? ?Curtis Sites ?04/06/2022, 6:26 AM ?

## 2022-04-06 NOTE — Progress Notes (Signed)
Inpatient Rehabilitation Care Coordinator ?Assessment and Plan ?Patient Details  ?Name: Jeffrey Ayala ?MRN: [id number] ?Date of Birth: 1961-05-22 ? ?Today's Date: 04/06/2022 ? ?Hospital Problems: Principal Problem: ?  S/P BKA (below knee amputation), right ([place]) ?Active Problems: ?  Unilateral complete BKA, right, initial encounter ([place]) ?  Arthralgia of right lower leg ?  Acute blood loss anemia ? ?Past Medical History:  ?Past Medical History:  ?Diagnosis Date  ? Concussion   ? Eosinophilic esophagitis   ? EGD/bx by Dr. Havery Moros  ? Gastric ulcer   ? History of kidney stones   ? Intermittent dysphagia   ? Migraine   ? ?Past Surgical History:  ?Past Surgical History:  ?Procedure Laterality Date  ? AMPUTATION Right 03/15/2022  ? Procedure: RIGHT BELOW KNEE AMPUTATION;  Surgeon: [name], MD;  Location: [place];  Service: Orthopedics;  Laterality: Right;  ? CYSTOSCOPY    ? DENTAL SURGERY    ? FINGER SURGERY    ? HERNIA REPAIR    ? at age 25  ? IR FLUORO GUIDE CV LINE RIGHT  03/09/2022  ? IR PATIENT EVAL TECH 0-60 MINS  03/09/2022  ? IR US GUIDE VASC ACCESS RIGHT  03/09/2022  ? KNEE ARTHROSCOPY Left   ? LITHOTRIPSY    ? NEPHROLITHOTOMY    ? SCROTAL EXPLORATION N/A 03/16/2022  ? Procedure: IRRIGATION AND DEBRIDEMENT SCROTUM;  Surgeon: Raynelle Bring, MD;  Location: [place];  Service: Urology;  Laterality: N/A;  ? SCROTAL EXPLORATION N/A 03/28/2022  ? Procedure: IRRIGATION AND DEBRIDEMENT SCROTUM;  Surgeon: Ardis Hughs, MD;  Location: [place];  Service: Urology;  Laterality: N/A;  ? TONSILLECTOMY    ? TOTAL KNEE ARTHROPLASTY Bilateral 11/04/2018  ? Procedure: BILATERAL TOTAL KNEE ARTHROPLASTY;  Surgeon: [name], MD;  Location: [place];  Service: Orthopedics;  Laterality: Bilateral;  spinal/epidural per anesthesiologist  ? TRANSMETATARSAL AMPUTATION Left 03/15/2022  ? Procedure: LEFT TRANSMETATARSAL AMPUTATION;  Surgeon: [name], MD;  Location: [place];  Service: Orthopedics;  Laterality: Left;  ? ?Social  History:  reports that he has never smoked. He has never used smokeless tobacco. He reports current alcohol use. He reports that he does not use drugs. ? ?Family / Support Systems ?Marital Status: Married ?How Long?: 32 years ?Patient Roles: Parent, Spouse ?Spouse/Significant Other: [name] (wife) [phone] ?Children: Two adult sons: Marcello Moores (youngest/lives in [place]), and Clinton (oldest/lives in [place]). ?Other Supports: Neighbors/church family ?Anticipated Caregiver: Wife ?Ability/Limitations of Caregiver: Wife works from home and able to provide 24/7 care ?Caregiver Availability: 24/7 ?Family Dynamics: Pt lives with his wife who works from home. Pt works in Press photographer. ? 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 ?Education: high school grad ?Health Literacy - How often do you need to have someone help you when you read instructions, pamphlets, or other written material from your doctor or pharmacy?: Never ?Writes: Yes ?Employment Status: Employed ?Name of Employer: [employer] ?Length of Employment: 10 (years) ?Return to Work Plans: TBD. Pt is not eligible for FMLA through employer because he does not live in the same state as the company home base. SW discussed applying for SSDI. ?Legal History/Current Legal Issues: Denies ?Guardian/Conservator: N/A  ? ?Abuse/Neglect ?Abuse/Neglect Assessment Can Be Completed: Yes ?Physical Abuse: Denies ?Verbal Abuse: Denies ?Sexual Abuse: Denies ?Exploitation of patient/patient's resources: Denies ?Self-Neglect: Denies ? ?Patient response to: ?Social Isolation - How often do  you feel lonely or isolated from those around you?: Never ? ?Emotional Status ?Pt's affect, behavior and adjustment status: Pt adjusting as well as to be expected. ?Recent Psychosocial Issues: Adjusting to new normal since losing limbs and being on HD during this hospital  stay. ?Psychiatric History: Denies ?Substance Abuse History: Pt denies he smokes cigarettes. Drinks alcohol on occassion. ? ?Patient / Family Perceptions, Expectations & Goals ?Pt/Family understanding of illness & functional limitations: Pt and family have a general understanding of pt care needs ?Premorbid pt/family roles/activities: Independent ?Anticipated changes in roles/activities/participation: Assistance with ADLs/IADLs ?Pt/family expectations/goals: Pt goal is to be able to function around the house; Wife's goal is to help him nagivate through this life change. ? ?[employer] ?Community Agencies: None ?Premorbid Home Care/DME Agencies: None ?Transportation available at discharge: TBD ?Is the patient able to respond to transportation needs?: Yes ?In the past 12 months, has lack of transportation kept you from medical appointments or from getting medications?: No ?In the past 12 months, has lack of transportation kept you from meetings, work, or from getting things needed for daily living?: No ?Resource referrals recommended: Neuropsychology ? ?Discharge Planning ?Living Arrangements: Spouse/significant other ?Support Systems: Spouse/significant other, Children, Friends/neighbors, Church/faith community ?Type of Residence: Private residence ?Insurance Resources: Multimedia programmer (specify) Nurse, mental health) ?Financial Resources: Employment ?Financial Screen Referred: No ?Living Expenses: Mortgage ?Money Management: Patient, Spouse ?Does the patient have any problems obtaining your medications?: No ?Home Management: Both help manage home care needs. ?Patient/Family Preliminary Plans: TBD ?Care Coordinator Anticipated Follow Up Needs: HH/OP ?Expected length of stay: 3-3.5 weeks ? ?Clinical Impression ?SW met with pt and pt wife in room to introduce self, explain role, and discuss discharge process. 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 No HCPOA. DME: shower bench, RW, and cane.  ? ?Rana Snare ?04/06/2022, 3:07 PM ? ?  ?

## 2022-04-06 NOTE — Care Management (Signed)
Inpatient Rehabilitation Center ?Individual Statement of Services ? ?Patient Name:  Jeffrey Ayala  ?Date:  04/06/2022 ? ?Welcome to the [facility].  Our goal is to provide you with an individualized program based on your diagnosis and situation, designed to meet your specific needs.  With this comprehensive rehabilitation program, you will be expected to participate in at least 3 hours of rehabilitation therapies Monday-Friday, with modified therapy programming on the weekends. ? ?Your rehabilitation program will include the following services:  Physical Therapy (PT), Occupational Therapy (OT), 24 hour per day rehabilitation nursing, Therapeutic Recreaction (TR), Psychology, Neuropsychology, Care Coordinator, Rehabilitation Medicine, Nutrition Services, Pharmacy Services, and Other ? ?Weekly team conferences will be held on Tuesdays to discuss your progress.  Your Inpatient Rehabilitation Care Coordinator will talk with you frequently to get your input and to update you on team discussions.  Team conferences with you and your family in attendance may also be held. ? ?Expected length of stay: 3-3.5 weeks   ? ?Overall anticipated outcome: Supervision ? ?Depending on your progress and recovery, your program may change. Your Inpatient Rehabilitation Care Coordinator will coordinate services and will keep you informed of any changes. Your Inpatient Rehabilitation Care Coordinator's name and contact numbers are listed  below. ? ?The following services may also be recommended but are not provided by the [facility]:  ?Driving Evaluations ?Home Health Rehabiltiation Services ?Outpatient Rehabilitation Services ?Vocational Rehabilitation ?  ?Arrangements will be made to provide these services after discharge if needed.  Arrangements include referral to agencies that provide these services. ? ?Your insurance has been verified to be:  [employer] ? ?Your primary doctor is:  No PCP  listed ? ?Pertinent information will be shared with your doctor and your insurance company. ? ?Inpatient Rehabilitation Care Coordinator:  Cathleen Corti [phone] or (C) [phone] ? ?Information discussed with and copy given to patient by: Rana Snare, 04/06/2022, 11:02 AM    ?

## 2022-04-06 NOTE — Progress Notes (Signed)
Inpatient Rehabilitation  Patient information reviewed and entered into eRehab system by Dellene Mcgroarty M. Gavynn Duvall, M.A., CCC/SLP, PPS Coordinator.  Information including medical coding, functional ability and quality indicators will be reviewed and updated through discharge.    

## 2022-04-06 NOTE — Progress Notes (Signed)
?                                                       PROGRESS NOTE ? ? ?Subjective/Complaints: ?  ?Pt without any complaints. Says he has no pain in either leg. Therapy is going well ? ?ROS: Patient denies fever, rash, sore throat, blurred vision, dizziness, nausea, vomiting, diarrhea, cough, shortness of breath or chest pain, joint or back/neck pain, headache, or mood change.  ? ?Objective: ?  ?No results found. ?Recent Labs  ?  04/04/22 ?1441  ?WBC 8.5  ?HGB 7.6*  ?HCT 22.6*  ?PLT 231  ? ? ?Recent Labs  ?  04/04/22 ?1439  ?NA 140  ?K 3.4*  ?CL 102  ?CO2 26  ?GLUCOSE 124*  ?BUN 42*  ?CREATININE 5.51*  ?CALCIUM 8.4*  ? ? ? ?Intake/Output Summary (Last 24 hours) at 04/06/2022 0928 ?Last data filed at 04/05/2022 2336 ?Gross per 24 hour  ?Intake 120 ml  ?Output 550 ml  ?Net -430 ml  ? ?  ? ?  ? ?Physical Exam: ?Vital Signs ?Blood pressure (!) 144/90, pulse 84, temperature 98.2 ?F (36.8 ?C), resp. rate 18, height '5\' 10"'$  (1.778 m), SpO2 96 %. ? ? ? ?Physical Exam ? ? ?Constitutional: No distress . Vital signs reviewed. ?HEENT: NCAT, EOMI, oral membranes moist ?Neck: supple ?Cardiovascular: RRR without murmur. No JVD    ?Respiratory/Chest: CTA Bilaterally without wheezes or rales. Normal effort    ?GI/Abdomen: BS +, non-tender, non-distended ?Ext: no clubbing, cyanosis, or edema ?Psych: pleasant and cooperative  ?Skin: wounds Clean and intact without signs of breakdown. Cath site clean ?Neuro: alert and oriented ?Extremities: Left TMA wound with sutures R BKA wound with staples in place. Areas are clean and dry. no signs of infection. Wearing shrinkers ?R 4th digit with swelling at PIP ?  ? ?  ? ? ?Assessment/Plan: ?1. Functional deficits which require 3+ hours per day of interdisciplinary therapy in a comprehensive inpatient rehab setting. ?Physiatrist is providing close team supervision and 24 hour management of active medical problems listed below. ?Physiatrist and rehab team continue to assess barriers to  discharge/monitor patient progress toward functional and medical goals ? ?Care Tool: ? ?Bathing ?   ?Body parts bathed by patient: Right arm, Left arm, Chest, Abdomen, Right upper leg, Left upper leg, Face  ? Body parts bathed by helper: Front perineal area, Buttocks, Left lower leg ?Body parts n/a: Right lower leg ?  ?Bathing assist Assist Level: Moderate Assistance - Patient 50 - 74% ?  ?  ?Upper Body Dressing/Undressing ?Upper body dressing   ?What is the patient wearing?: Pull over shirt ?   ?Upper body assist Assist Level: Minimal Assistance - Patient > 75% ?   ?Lower Body Dressing/Undressing ?Lower body dressing ? ? ?   ?What is the patient wearing?: Pants ? ?  ? ?Lower body assist Assist for lower body dressing: Total Assistance - Patient < 25% ?   ? ?Toileting ?Toileting    ?Toileting assist Assist for toileting: 2 Helpers ?  ?  ?Transfers ?Chair/bed transfer ? ?Transfers assist ?   ? ?Chair/bed transfer assist level: 2 Helpers ?  ?  ?Locomotion ?Ambulation ? ? ?Ambulation assist ? ? Ambulation activity did not occur: Safety/medical concerns ? ?  ?  ?   ? ?Walk 10 feet  activity ? ? ?Assist ? Walk 10 feet activity did not occur: Safety/medical concerns ? ?  ?   ? ?Walk 50 feet activity ? ? ?Assist Walk 50 feet with 2 turns activity did not occur: Safety/medical concerns ? ?  ?   ? ? ?Walk 150 feet activity ? ? ?Assist Walk 150 feet activity did not occur: Safety/medical concerns ? ?  ?  ?  ? ?Walk 10 feet on uneven surface  ?activity ? ? ?Assist Walk 10 feet on uneven surfaces activity did not occur: Safety/medical concerns ? ? ?  ?   ? ?Wheelchair ? ? ? ? ?Assist Is the patient using a wheelchair?: Yes ?Type of Wheelchair: Manual ?Wheelchair activity did not occur: Safety/medical concerns (unable to tolerate sitting upright in standard wc, TIS used) ? ?Wheelchair assist level: Dependent - Patient 0% ?   ? ? ?Wheelchair 50 feet with 2 turns activity ? ? ? ?Assist ? ?  ?  ? ? ?Assist Level: Dependent - Patient  0%  ? ?Wheelchair 150 feet activity  ? ? ? ?Assist ?   ? ? ?Assist Level: Dependent - Patient 0%  ? ?Blood pressure (!) 144/90, pulse 84, temperature 98.2 ?F (36.8 ?C), resp. rate 18, height '5\' 10"'$  (1.778 m), SpO2 96 %. ? ?Medical Problem List and Plan: ?1. Functional deficits secondary to debility, right BKA ?            -patient may shower ?            -ELOS/Goals: 14 days---suspect less. S ?          -Continue CIR therapies including PT, OT  ?2.  Antithrombotics: ?-DVT/anticoagulation:  Mechanical: Sequential compression devices, below knee Left lower extremity ?            -antiplatelet therapy: ASA  ?3. Pain Management: Oxycodone prn. ?-remains well controlled ?4. Mood: LCSW to follow for evaluation and support.  ?            -antipsychotic agents: N/A ?5. Neuropsych: This patient maybe intermittently capable of making decisions on his own behalf. ?6. Skin/Wound Care: Air mattress for pressure relief measures.  ?            --continue Vit C, Zinc and protein supplements to help promote healing of multiple wounds. ?            --added juven for protein calorie malnutrition.  ? -shrinkers for bilateral LE's ?7. Fluids/Electrolytes/Nutrition: Strict I/O. Monitor weights daily.  ?            --renal diet with 1200 cc/FR. Has been refusing nephro-->will try juven.  ?8. Scrotal wound infection/gangrene s/p I and D: Completes Augmentin and Zyberxa today ?            --sling scrotum for support.  ?9. Acute kidney injury/ HD dependent--now ESRD: Still being monitored for chance of recovery ?--need strict I/O. Vein mapping completed 05/12 ?-- HD MWF at the end of the day to help with tolerance of therapy.  ?--has been Clip'd for TTS at [facility]  ?10. BLE ischemia s/p Left TMA and R-BKA: NWB BLE and continue to use Shrinkers ?11. Acute blood loss anemia: Improved and stable. Continue Aranesp 200 mcg/wk. Had 2 units PRBC on 5/8 ? -H/H a little lower to 7.6, Occult fecal negative, continue to follow ?12.  Afib:  Monitor HR TID--continue metoprolol every 8 hours. Rate controlled ?            --  no AC due to low CHADVASC score.  ?13. Metabolic encephalopathy/delirium: Has resolved. Continue Seroquel at bedtime to help with sleep.  ?14. Diarrhea: improved ?--dc fiber ?-continue prn's ?15. Gout ? -On allopurinol '100mg'$  ? -Prednisone '10mg'$  for suspected gout in finger,continue ? ?LOS: ?3 days ?A FACE TO FACE EVALUATION WAS PERFORMED ? ?Meredith Staggers ?04/06/2022, 9:28 AM  ? ? ? ?

## 2022-04-07 DIAGNOSIS — D62 Acute posthemorrhagic anemia: Secondary | ICD-10-CM | POA: Diagnosis not present

## 2022-04-07 DIAGNOSIS — Z89511 Acquired absence of right leg below knee: Secondary | ICD-10-CM | POA: Diagnosis not present

## 2022-04-07 DIAGNOSIS — N186 End stage renal disease: Secondary | ICD-10-CM | POA: Diagnosis not present

## 2022-04-07 DIAGNOSIS — I739 Peripheral vascular disease, unspecified: Secondary | ICD-10-CM | POA: Diagnosis not present

## 2022-04-07 LAB — GLUCOSE, CAPILLARY: Glucose-Capillary: 105 mg/dL — ABNORMAL HIGH (ref 70–99)

## 2022-04-07 MED ORDER — QUETIAPINE FUMARATE 25 MG PO TABS
25.0000 mg | ORAL_TABLET | Freq: Every day | ORAL | Status: DC
  Administered 2022-04-07 – 2022-04-14 (×8): 25 mg via ORAL
  Filled 2022-04-07 (×8): qty 1

## 2022-04-07 MED ORDER — MELATONIN 5 MG PO TABS
5.0000 mg | ORAL_TABLET | Freq: Every day | ORAL | Status: DC
  Administered 2022-04-07 – 2022-04-14 (×8): 5 mg via ORAL
  Filled 2022-04-07 (×8): qty 1

## 2022-04-07 NOTE — Progress Notes (Signed)
Occupational Therapy Session Note ? ?Patient Details  ?Name: Jeffrey Ayala ?MRN: [id number] ?Date of Birth: Dec 31, 1960 ? ?Today's Date: 04/07/2022 ?OT Individual Time: 0900-1000 ?OT Individual Time Calculation (min): 60 min  ? ?Today's Date: 04/07/2022 ?OT Individual Time: 5284-1324 ?OT Individual Time Calculation (min): 24 min  ?Short Term Goals: ?Week 1:  OT Short Term Goal 1 (Week 1): Pt will compelte SB to w/c with 1 caregiver in prep for toileting ?OT Short Term Goal 2 (Week 1): Pt will don pants at bed level with MINA ?OT Short Term Goal 3 (Week 1): Pt will lateral lean in B directions with MIN A in prep for toilet/LB dressign tasks ?OT Short Term Goal 4 (Week 1): Pt will sit EOB/EOM dyanmically with supervision for 10 min and no LOB to demo imrpoved trunk control ? ?Skilled Therapeutic Interventions/Progress Updates:  ?   ?Pt received in bed with no pain reported  ?Therapeutic exercise ?Pt completes w/c propulsion with supervision and increased time. Cuing for steering around through [place] ? ?3x 3-4 min on UE ergometer while OT applies theraband to R handle to improve grip d/t decreased grip  ?Pt completes 3x1 min beach ball volley in supported sitting position with 3 # dowel rod for dynamic balance, postural control, BUE strengthening and endurance required for BADLs and functional transfers.  ? ?Therapeutic activity ?Moca screening: 15/30 with increased impairments with visual-spatial, naming, serial subtraction, short term and delayed recall and orientation. Pt reports, "I dont think I did too well on that." Discussed with team-SLP eval for cognition ? ?Pt left at end of session in w/c with exit alarm on, call light in reach and all needs met. Pt wanting to trial sitting up  ? ?Session2: ? ?Pt received in bed with no pain. Session cut short d/t MD wanting to look at wound ? ?Therapeutic activity ?Worked on bed mobiltiy, lateral leaning and clothing amangment at EOB as well as trunk control activities in  prep for toileting and LB dressing tasks. CGA-MIN A for trunk management during sup>sit with no bed rail. Pt able to lateral lean at EOB to fully advance pants past hips, however requires MOD A to fully pull up posteriorly. ? ?Pt left at end of session in bed with exit alarm on, call light in reach and all needs met ? ?Therapy Documentation ?Precautions:  ?Precautions ?Precautions: Fall, Other (comment) ?Precaution Comments: Contact ?Required Braces or Orthoses: Other Brace ?Other Brace: limb guard ?Restrictions ?Weight Bearing Restrictions: Yes ?RLE Weight Bearing: Non weight bearing ?LLE Weight Bearing: Non weight bearing ?Other Position/Activity Restrictions: bilat LEs ?General: ?  ? ?Therapy/Group: Individual Therapy ? ?Lowella Dell Brookley Spitler ?04/07/2022, 6:48 AM ?

## 2022-04-07 NOTE — Progress Notes (Signed)
Patient ID: Jeffrey Ayala, male   DOB: [birth year], 61 y.o.   MRN: [id number] ? ?SW unable to meet with pt to give updates as he was in dialysis.  ? ?1413-SW spoke with pt wife Jeffrey Ayala to provide updates from team conference and d/c date 5/24. Fam edu scheduled on Friday (5/19) 8am-12pm.  ? ?Loralee Pacas, MSW, LCSWA ?Office: [phone] ?Cell: [phone] ?Fax: [phone]  ?

## 2022-04-07 NOTE — Progress Notes (Signed)
Patient ID: Jeffrey Ayala, male   DOB: 02-Jun-1961, 61 y.o.   MRN: [id number] ? ?  ?Subjective: ?Pt denies scrotal pain. ? ?Objective: ?Vital signs in last 24 hours: ?Temp:  [98.1 ?F (36.7 ?C)-98.3 ?F (36.8 ?C)] 98.1 ?F (36.7 ?C) (05/16 7782) ?Pulse Rate:  [84-98] 90 (05/16 0618) ?Resp:  [18] 18 (05/16 0618) ?BP: (137-156)/(81-90) 145/81 (05/16 0618) ?SpO2:  [99 %-100 %] 99 % (05/16 0618) ? ?Intake/Output from previous day: ?05/15 0701 - 05/16 0700 ?In: 360 [P.O.:360] ?Out: 201 [Urine:200; Stool:1] ?Intake/Output this shift: ?No intake/output data recorded. ? ?Physical Exam:  ?General: Alert and oriented ?GU: Minimal serosanguinous drainage, incision intact with loosely placed skin stiches.  No erythema, purulence, fluctuance ? ?Lab Results: ?Recent Labs  ?  04/04/22 ?4235 04/06/22 ?2301  ?HGB 7.6* 7.1*  ?HCT 22.6* 22.5*  ? ?BMET ?Recent Labs  ?  04/04/22 ?1439 04/06/22 ?2301  ?NA 140 139  ?K 3.4* 4.0  ?CL 102 105  ?CO2 26 21*  ?GLUCOSE 124* 134*  ?BUN 42* 62*  ?CREATININE 5.51* 8.15*  ?CALCIUM 8.4* 8.2*  ? ? ? ?Studies/Results: ?No results found. ? ?Assessment/Plan: ?1) Scrotal infection: Wound continuing to heal well.  Expect continued mild drainage.  Would just reinforce dressing for drainage prn.  Will continue to monitor weekly. ? ? LOS: 4 days  ? ?[name] ?04/07/2022, 7:42 AM ? ?  ?

## 2022-04-07 NOTE — Progress Notes (Addendum)
?                                                       PROGRESS NOTE ? ? ?Subjective/Complaints: ?Pt up with PT. Didn't sleep all that well. Received melatonin which helped. Pain seems controlled.  ? ?ROS: Patient denies fever, rash, sore throat, blurred vision, dizziness, nausea, vomiting, diarrhea, cough, shortness of breath or chest pain,  back/neck pain, headache, or mood change.  ? ?  ? ?Objective: ?  ?No results found. ? ?Recent Labs  ?  04/04/22 ?1441 04/06/22 ?2301  ?WBC 8.5 7.5  ?HGB 7.6* 7.1*  ?HCT 22.6* 22.5*  ?PLT 231 240  ? ?Recent Labs  ?  04/04/22 ?1439 04/06/22 ?2301  ?NA 140 139  ?K 3.4* 4.0  ?CL 102 105  ?CO2 26 21*  ?GLUCOSE 124* 134*  ?BUN 42* 62*  ?CREATININE 5.51* 8.15*  ?CALCIUM 8.4* 8.2*  ? ? ?Intake/Output Summary (Last 24 hours) at 04/07/2022 0917 ?Last data filed at 04/07/2022 7412 ?Gross per 24 hour  ?Intake 360 ml  ?Output 201 ml  ?Net 159 ml  ?  ? ?  ? ?Physical Exam: ?Vital Signs ?Blood pressure (!) 146/80, pulse 88, temperature 98.1 ?F (36.7 ?C), resp. rate 18, height '5\' 10"'$  (1.778 m), SpO2 99 %. ? ? ? ?Physical Exam ? ? ?Constitutional: No distress . Vital signs reviewed. obese ?HEENT: NCAT, EOMI, oral membranes moist ?Neck: supple ?Cardiovascular: RRR without murmur. No JVD    ?Respiratory/Chest: CTA Bilaterally without wheezes or rales. Normal effort    ?GI/Abdomen: BS +, non-tender, non-distended ?Ext: no clubbing, cyanosis, or edema ?Psych: generally pleasant and cooperative  ?Skin: right elbow wound appears clean but has depth, perhaps cm. Both amputee sites cdi. Scrotum not seen this morning ?Neuro: alert and oriented ?Musc:  Wearing shrinkers ?R 4th digit with swelling at PIP ?  ? ?  ? ? ?Assessment/Plan: ?1. Functional deficits which require 3+ hours per day of interdisciplinary therapy in a comprehensive inpatient rehab setting. ?Physiatrist is providing close team supervision and 24 hour management of active medical problems listed below. ?Physiatrist and rehab team  continue to assess barriers to discharge/monitor patient progress toward functional and medical goals ? ?Care Tool: ? ?Bathing ?   ?Body parts bathed by patient: Right arm, Left arm, Chest, Abdomen, Right upper leg, Left upper leg, Face  ? Body parts bathed by helper: Front perineal area, Buttocks, Left lower leg ?Body parts n/a: Right lower leg ?  ?Bathing assist Assist Level: Moderate Assistance - Patient 50 - 74% ?  ?  ?Upper Body Dressing/Undressing ?Upper body dressing   ?What is the patient wearing?: Pull over shirt ?   ?Upper body assist Assist Level: Minimal Assistance - Patient > 75% ?   ?Lower Body Dressing/Undressing ?Lower body dressing ? ? ?   ?What is the patient wearing?: Pants ? ?  ? ?Lower body assist Assist for lower body dressing: Total Assistance - Patient < 25% ?   ? ?Toileting ?Toileting    ?Toileting assist Assist for toileting: 2 Helpers ?  ?  ?Transfers ?Chair/bed transfer ? ?Transfers assist ?   ? ?Chair/bed transfer assist level: 2 Helpers ?  ?  ?Locomotion ?Ambulation ? ? ?Ambulation assist ? ? Ambulation activity did not occur: Safety/medical concerns ? ?  ?  ?   ? ?  Walk 10 feet activity ? ? ?Assist ? Walk 10 feet activity did not occur: Safety/medical concerns ? ?  ?   ? ?Walk 50 feet activity ? ? ?Assist Walk 50 feet with 2 turns activity did not occur: Safety/medical concerns ? ?  ?   ? ? ?Walk 150 feet activity ? ? ?Assist Walk 150 feet activity did not occur: Safety/medical concerns ? ?  ?  ?  ? ?Walk 10 feet on uneven surface  ?activity ? ? ?Assist Walk 10 feet on uneven surfaces activity did not occur: Safety/medical concerns ? ? ?  ?   ? ?Wheelchair ? ? ? ? ?Assist Is the patient using a wheelchair?: Yes ?Type of Wheelchair: Manual ?Wheelchair activity did not occur: Safety/medical concerns (unable to tolerate sitting upright in standard wc, TIS used) ? ?Wheelchair assist level: Dependent - Patient 0% ?   ? ? ?Wheelchair 50 feet with 2 turns activity ? ? ? ?Assist ? ?  ?   ? ? ?Assist Level: Dependent - Patient 0%  ? ?Wheelchair 150 feet activity  ? ? ? ?Assist ?   ? ? ?Assist Level: Dependent - Patient 0%  ? ?Blood pressure (!) 146/80, pulse 88, temperature 98.1 ?F (36.7 ?C), resp. rate 18, height '5\' 10"'$  (1.778 m), SpO2 99 %. ? ?Medical Problem List and Plan: ?1. Functional deficits secondary to debility, right BKA ?            -patient may shower ?            -ELOS/Goals: 14 days---suspect less. S ?            -Continue CIR therapies including PT and OT. Interdisciplinary team conference today to discuss goals, barriers to discharge, and dc planning.  ? -will change to regular mattress to help with mobility, ?comfort ?2.  Antithrombotics: ?-DVT/anticoagulation:  Mechanical: Sequential compression devices, below knee Left lower extremity ?            -antiplatelet therapy: ASA  ?3. Pain Management: Oxycodone prn. ?-remains well controlled ?4. Mood/sleep: pt with some definite coping issues. Doesn't remember a lot of his acute stay either ? -team providing ego support.  ? -would like neuropsych to see ? -will increase melatonin go '5mg'$  qhs, continue seroquel '25mg'$ , has trazodone prn ?            -antipsychotic agents: seroquel ?5. Neuropsych: This patient maybe intermittently capable of making decisions on his own behalf. ?6. Skin/Wound Care: Air mattress for pressure relief measures.  ?            --continue Vit C, Zinc and protein supplements to help promote healing of multiple wounds. ?            --added juven for protein calorie malnutrition.  ? -shrinkers for bilateral LE's ?7. Fluids/Electrolytes/Nutrition: Strict I/O. Monitor weights daily.  ?            --renal diet with 1200 cc/FR. Has been refusing nephro-->will try juven.  ?8. Scrotal wound infection/gangrene s/p I and D: Completes Augmentin and Zyberxa today ?            --sling scrotum for support.  ? -5/16 urology actually saw pt today--> c/w plan ? -I'll look at it later this morning when he's back in bed ?9. Acute  kidney injury/ HD dependent--now ESRD: Still being monitored for chance of recovery ?--need strict I/O. Vein mapping completed 05/12 ?-- HD MWF at the end of the day to help  with tolerance of therapy.  ?--has been Clip'd for TTS at [facility].  ?10. BLE ischemia s/p Left TMA and R-BKA: NWB BLE and continue to use Shrinkers ?11. Acute blood loss anemia: Improved and stable. Continue Aranesp 200 mcg/wk. Had 2 units PRBC on 5/8 ? -H/H a little lower to 7.1, transfuse if needed in HD ?12.  Afib: Monitor HR TID--continue metoprolol every 8 hours. Rate controlled ?            --no AC due to low CHADVASC score.  ?13. Metabolic encephalopathy/delirium: Has resolved. Continue Seroquel at bedtime to help with sleep.  ?14. Diarrhea: appears improved ?--dc fiber ?-continue prn's ?15. Gout ? -On allopurinol '100mg'$  ? -Prednisone '10mg'$  for suspected gout in finger ?  -decrease to '5mg'$  ? ?LOS: ?4 days ?A FACE TO FACE EVALUATION WAS PERFORMED ? ?Meredith Staggers ?04/07/2022, 9:17 AM  ? ? ? ?

## 2022-04-07 NOTE — Progress Notes (Signed)
[facility] ?NEPHROLOGY PROGRESS NOTE ? ?Assessment/ Plan: ? ?# Acute kidney Injury: ATN from severe septic shock in the setting of foot wounds. He has been on dialysis dependent since 02/26/2022 (initially started with CRRT and transition to IHD with first tx 4/12).  Vein mapping done.  They would really like to get him through rehab stay before doing an AVF ?- HD per TTS schedule, next HD today. ?- We have clipped as AKI for now and watching for recovery, though may be end stage given prolonged dependence on dialysis.  He has an outpatient HD unit (as an AKI) at [facility] on TTS schedule ?- before discharge will need to ensure that he is able to tolerate HD in a chair.   ? ?# Scrotal edema/wound: Status post surgical debridement by urology on 4/24 and on 5/6 underwent I&D of scrotum with wound closure and drain placement.   ? ?# Leg wound status post left TMA and right below-knee amputation on 03/15/2022: Septic shock resolved.  ?  ?# Anemia - acute blood loss: s/p PRBC's multiple units. CT abd was negative for acute process.  on Aranesp, monitor Hb.  ? 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  Monitor calcium, phosphorus. ? ?Subjective: Seen and examined in inpatient rehab.  No new event.  Plan for dialysis today.  His wife was present at the bedside.  He denies nausea, vomiting, chest pain, shortness of breath. ? ?Objective ?Vital signs in last 24 hours: ?Vitals:  ? 04/06/22 1331 04/06/22 2031 04/07/22 0618 04/07/22 5809  ?BP: 137/83 (!) 156/90 (!) 145/81 (!) 146/80  ?Pulse: 89 98 90 88  ?Resp: '18 18 18   '$ ?Temp: 98.3 ?F (36.8 ?C) 98.3 ?F (36.8 ?C) 98.1 ?F (36.7 ?C)   ?TempSrc:      ?SpO2: 100% 100% 99%   ?Height:      ? ?Weight change:  ? ?Intake/Output Summary (Last 24 hours) at 04/07/2022 0907 ?Last data filed at 04/07/2022 9833 ?Gross per 24 hour  ?Intake 360 ml  ?Output 201 ml  ?Net 159 ml  ? ? ? ? ? ? ?Labs: ?Basic Metabolic Panel: ?Recent Labs  ?Lab 04/03/22 ?0202 04/04/22 ?1439 04/06/22 ?2301  ?NA 137 140  139  ?K 3.7 3.4* 4.0  ?CL 103 102 105  ?CO2 22 26 21*  ?GLUCOSE 111* 124* 134*  ?BUN 47* 42* 62*  ?CREATININE 6.73* 5.51* 8.15*  ?CALCIUM 8.3* 8.4* 8.2*  ?PHOS 5.9* 4.1 6.7*  ? ? ?Liver Function Tests: ?Recent Labs  ?Lab 04/01/22 ?0300 04/02/22 ?0141 04/03/22 ?0202 04/04/22 ?1439 04/06/22 ?2301  ?AST 26  --   --   --   --   ?ALT 12  --   --   --   --   ?ALKPHOS 81  --   --   --   --   ?BILITOT 1.5*  --   --   --   --   ?PROT 6.7  --   --   --   --   ?ALBUMIN 2.5*   < > 2.4* 2.6* 2.6*  ? < > = values in this interval not displayed.  ? ? ?No results for input(s): LIPASE, AMYLASE in the last 168 hours. ?No results for input(s): AMMONIA in the last 168 hours. ?CBC: ?Recent Labs  ?Lab 04/01/22 ?0300 04/04/22 ?1441 04/06/22 ?2301  ?WBC 8.4 8.5 7.5  ?NEUTROABS 6.3  --   --   ?HGB 8.3* 7.6* 7.1*  ?HCT 25.9* 22.6* 22.5*  ?MCV 92.2 91.5 93.4  ?  PLT 210 231 240  ? ? ?Cardiac Enzymes: ?No results for input(s): CKTOTAL, CKMB, CKMBINDEX, TROPONINI in the last 168 hours. ?CBG: ?Recent Labs  ?Lab 04/02/22 ?1554 04/02/22 ?2107 04/03/22 ?6629 04/03/22 ?1131 04/07/22 ?4765  ?GLUCAP 135* 144* 109* 116* 105*  ? ? ? ?Iron Studies: No results for input(s): IRON, TIBC, TRANSFERRIN, FERRITIN in the last 72 hours. ?Studies/Results: ?No results found. ? ?Medications: ?Infusions: ? sodium chloride    ? sodium chloride    ? ? ?Scheduled Medications: ? (feeding supplement) PROSource Plus  30 mL Oral TID BM  ? acetaminophen  650 mg Oral Q6H  ? allopurinol  100 mg Oral Daily  ? vitamin C  1,000 mg Oral Daily  ? aspirin  81 mg Oral Daily  ? Chlorhexidine Gluconate Cloth  6 each Topical Q0600  ? Chlorhexidine Gluconate Cloth  6 each Topical Q0600  ? darbepoetin (ARANESP) injection - DIALYSIS  200 mcg Intravenous Q Tue-HD  ? feeding supplement (NEPRO CARB STEADY)  237 mL Oral BID BM  ? folic acid  1 mg Oral Daily  ? guaiFENesin  400 mg Oral Q8H  ? lanthanum  1,000 mg Oral TID WC  ? melatonin  3 mg Oral QHS  ? metoprolol tartrate  37.5 mg Oral TID  ?  multivitamin  1 tablet Oral QHS  ? nutrition supplement (JUVEN)  1 packet Oral BID BM  ? pantoprazole  40 mg Oral Daily  ? predniSONE  10 mg Oral Q breakfast  ? QUEtiapine  25 mg Oral QHS  ? ? have reviewed scheduled and prn medications. ? ?Physical Exam: ?General:NAD, comfortable, able to lie flat. ?Heart:RRR, s1s2 nl ?Lungs: Clear  b/l, no crackle ?Abdomen:soft, Non-tender, non-distended ?Extremities:No edema, left TMA and right BKA. ?Dialysis Access: Right IJ TDC. ? ?[facility] ?04/07/2022,9:07 AM ? LOS: 4 days  ? ?

## 2022-04-07 NOTE — Progress Notes (Signed)
Physical Therapy Session Note ? ?Patient Details  ?Name: Jeffrey Ayala ?MRN: [id number] ?Date of Birth: 29-Nov-1960 ? ?Today's Date: 04/07/2022 ?PT Individual Time: 3335-4562 and 5638-9373 ?PT Individual Time Calculation (min): 47 min and 62 min ? ?Short Term Goals: ?Week 1:  PT Short Term Goal 1 (Week 1): supine to/from sit w/min assist ?PT Short Term Goal 2 (Week 1): sliding board transfer wc to/from mat w/mod assist of 2 ?PT Short Term Goal 3 (Week 1): Pt will tolerate 2 hr oob in wc and direct care for pressure relief as needed. ?PT Short Term Goal 4 (Week 1): Pt will propel standard wc 15f w/supervision. ? ?Skilled Therapeutic Interventions/Progress Updates:  ?   ?Session 1: ?Patient in bed upon PT arrival. Patient alert and agreeable to PT session. Patient denied pain during session.  ? ?Discussed use of an incontinence brief to reduce sheering to scrotum during slide board transfers, patient in agreement to trial wearing a brief today.  ? ?Therapeutic Activity: ?Bed Mobility: Patient performed rolling R/L with supervision with use of bed rails while donning a brief with total A and donning shorts with supervision with min cues for threading technique. He performed supine to sit with CGA to provide tactile cues for transitioning to side-lying on elbow to push up to sitting.  ?Transfers: Patient performed a slide board transfer air mattress>w/c with CGA-min A and total A for board placement. Provided cues for hand placement, board placement, and head-hips relationship for proper technique, increased hip clearance, and decreased assist with transfers.  ? ?MD rounded during session, discussed switching to a regular bed for improved mobility, and drainage form scrotal incision noted yesterday.  ? ?Therapeutic Exercise: ?Patient performed the following exercises with verbal and tactile cues for proper technique. ?-B LAQ 2x15 ?-lateral leans in w/c R/L 2x5 ?-w/c push ups 2x5 with reduced hip clearance due to upper  extremity weakness ? ?Patient in w/c with his wife in the room in the room at end of session with breaks locked and all needs within reach.  ? ?Session 2: ?Patient in w/c in the room with his wife upon PT arrival. Patient alert and agreeable to PT session. Patient reported 4-5/10 L foot pain with increased edema during session, RN made aware. PT provided repositioning, rest breaks, and distraction as pain interventions throughout session. Educated on lower extremity elevation and promoting knee extension to reduce contractures and edema. ? ?Patient tolerated sitting in the w/c >2.5 hours this morning with mild discomfort, mostly lower extremity per patient. ? ?Therapeutic Activity: ?Bed Mobility: Patient performed sit to supine with supervision. Provided verbal cues for trunk control for safety. Patient performed rolling R/L, as above during brief and dressing change due to smear BM noted during skin inspection for pressure from sitting in w/c this morning. No signs of increased pressure or skin breakdown noted on inspection. Moderate drainage from scrotal dressing, RN aware and observed incision and dressing during session. Applied clean abdominal pad and mesh underwear over incision and brief with shorts for increased protection with total A during rolling.  ?Transfers: Patient performed a slide board transfer air w/c>bed with CGA-close supervision and min A for board placement. Provided cues for hand placement, board placement, and head-hips relationship for proper technique, increased hip clearance, and decreased assist with transfers.  ? ?Wheelchair Mobility:  ?Patient propelled wheelchair >120 feet x2 with supervision and increased time/effort due to decreased upper extremity strength/endurance. Provided verbal cues for increased palmar grasp on R to improve grip with  4th digit goat flare, turning technique, use of breaks, and donning/doffing leg rests. ? ?Initiated custom wheelchair assessment for a  ultra-light manual wheelchair. Educated patient on benefits of ultra-light for improved energy conservation and reduced strain on his shoulders with propulsion. Educated on process for w/c evaluation and contacted [employer]. ? ?Patient in bed in the room at end of session with breaks locked, bed alarm set, and all needs within reach.  ? ?Therapy Documentation ?Precautions:  ?Precautions ?Precautions: Fall, Other (comment) ?Precaution Comments: Contact ?Required Braces or Orthoses: Other Brace ?Other Brace: limb guard ?Restrictions ?Weight Bearing Restrictions: Yes ?RLE Weight Bearing: Non weight bearing ?LLE Weight Bearing: Non weight bearing ?Other Position/Activity Restrictions: bilat LEs ? ? ? ?Therapy/Group: Individual Therapy ? ?Doreene Burke PT, DPT ? ?04/07/2022, 12:21 PM  ?

## 2022-04-08 MED ORDER — CHLORHEXIDINE GLUCONATE CLOTH 2 % EX PADS: 6.0000 | MEDICATED_PAD | Freq: Every day | CUTANEOUS | Status: DC

## 2022-04-08 MED ORDER — CHLORHEXIDINE GLUCONATE CLOTH 2 % EX PADS
6.0000 | MEDICATED_PAD | Freq: Two times a day (BID) | CUTANEOUS | Status: DC
  Administered 2022-04-08 – 2022-04-12 (×8): 6 via TOPICAL

## 2022-04-08 NOTE — Progress Notes (Signed)
Patient ID: Jeffrey Ayala, male   DOB: 1961/04/08, 61 y.o.   MRN: [id number] ? ?New patient appointment scheduled for [place] location for Thursday, June 22 at 11am with Marlane Hatcher, NP.  ? ?Loralee Pacas, MSW, LCSWA ?Office: [phone] ?Cell: [phone] ?Fax: [phone]  ?

## 2022-04-08 NOTE — Plan of Care (Signed)
?  Problem: Consults ?Goal: RH LIMB LOSS PATIENT EDUCATION ?Description: Description: See Patient Education module for eduction specifics. ?Outcome: Progressing ?Goal: Skin Care Protocol Initiated - if Braden Score 18 or less ?Description: If consults are not indicated, leave blank or document N/A ?Outcome: Progressing ?  ?Problem: RH BOWEL ELIMINATION ?Goal: RH STG MANAGE BOWEL WITH ASSISTANCE ?Description: STG Manage Bowel with min Assistance. ?Outcome: Progressing ?Goal: RH STG MANAGE BOWEL W/MEDICATION W/ASSISTANCE ?Description: STG Manage Bowel with Medication with min Assistance. ?Outcome: Progressing ?  ?Problem: RH SKIN INTEGRITY ?Goal: RH STG SKIN FREE OF INFECTION/BREAKDOWN ?Description: Skin to remain free from additional breakdown and infection with min assist while on rehab. ?Outcome: Progressing ?Goal: RH STG MAINTAIN SKIN INTEGRITY WITH ASSISTANCE ?Description: STG Maintain Skin Integrity With min Assistance. ?Outcome: Progressing ?Goal: RH STG ABLE TO PERFORM INCISION/WOUND CARE W/ASSISTANCE ?Description: STG Able To Perform Incision/Wound Care With min Assistance. ?Outcome: Progressing ?  ?Problem: RH SAFETY ?Goal: RH STG ADHERE TO SAFETY PRECAUTIONS W/ASSISTANCE/DEVICE ?Description: STG Adhere to Safety Precautions With Cues and Reminders. ?Outcome: Progressing ?Goal: RH STG DECREASED RISK OF FALL WITH ASSISTANCE ?Description: STG Decreased Risk of Fall With min Assistance. ?Outcome: Progressing ?  ?Problem: RH PAIN MANAGEMENT ?Goal: RH STG PAIN MANAGED AT OR BELOW PT'S PAIN GOAL ?Description: < 3 on a 0-10 pain scale. ?Outcome: Progressing ?  ?Problem: RH KNOWLEDGE DEFICIT LIMB LOSS ?Goal: RH STG INCREASE KNOWLEDGE OF SELF CARE AFTER LIMB LOSS ?Description: Patient will demonstrate knowledge of self-care management, medication/pain management, skin/wound care, weight bearing precautions, and HD awareness with educational materials and handouts provided by staff independently at discharge. ?Outcome:  Progressing ?  ?

## 2022-04-08 NOTE — Evaluation (Signed)
Speech Language Pathology Assessment and Plan  Patient Details  Name: Jeffrey Ayala MRN: [id number] Date of Birth: [birth year]  SLP Diagnosis: Cognitive Impairments  Rehab Potential: Excellent ELOS: 04/15/22    Today's Date: 04/08/2022 SLP Individual Time: 1660-6301 SLP Individual Time Calculation (min): 55 min   Hospital Problem: Principal Problem:   S/P BKA (below knee amputation), right ([name]) Active Problems:   Unilateral complete BKA, right, initial encounter ([name])   Arthralgia of right lower leg   Acute blood loss anemia  Past Medical History:  Past Medical History:  Diagnosis Date   Concussion    Eosinophilic esophagitis    EGD/bx by Dr. Adela Lank   Gastric ulcer    History of kidney stones    Intermittent dysphagia    Migraine    Past Surgical History:  Past Surgical History:  Procedure Laterality Date   AMPUTATION Right 03/15/2022   Procedure: RIGHT BELOW KNEE AMPUTATION;  Surgeon: Nadara Mustard, MD;  Location: [facility] OR;  Service: Orthopedics;  Laterality: Right;   CYSTOSCOPY     DENTAL SURGERY     FINGER SURGERY     HERNIA REPAIR     at age 61   IR FLUORO GUIDE CV LINE RIGHT  03/09/2022   IR PATIENT EVAL TECH 0-60 MINS  03/09/2022   IR US GUIDE VASC ACCESS RIGHT  03/09/2022   KNEE ARTHROSCOPY Left    LITHOTRIPSY     NEPHROLITHOTOMY     SCROTAL EXPLORATION N/A 03/16/2022   Procedure: IRRIGATION AND DEBRIDEMENT SCROTUM;  Surgeon: Heloise Purpura, MD;  Location: [facility] OR;  Service: Urology;  Laterality: N/A;   SCROTAL EXPLORATION N/A 03/28/2022   Procedure: IRRIGATION AND DEBRIDEMENT SCROTUM;  Surgeon: Crist Fat, MD;  Location: [facility] OR;  Service: Urology;  Laterality: N/A;   TONSILLECTOMY     TOTAL KNEE ARTHROPLASTY Bilateral 11/04/2018   Procedure: BILATERAL TOTAL KNEE ARTHROPLASTY;  Surgeon: Nadara Mustard, MD;  Location: [facility] OR;  Service: Orthopedics;  Laterality: Bilateral;  spinal/epidural per anesthesiologist   TRANSMETATARSAL AMPUTATION Left 03/15/2022    Procedure: LEFT TRANSMETATARSAL AMPUTATION;  Surgeon: Nadara Mustard, MD;  Location: [facility] OR;  Service: Orthopedics;  Laterality: Left;    Assessment / Plan / Recommendation Clinical Impression Patient is a 61 year old male admitted on 4/2 with c/o N/V/D and found to have septic shock due to scrotal cellulitis vs RLL PNA.  Pt intubated 4/3 and brief cardiac arrest post intubation. [name] started 4/5.  Pt with AKI and started on CRRT on 4/6. Pt extubated 4/10. Due to gangrene of B LE, pt underwent R BKA and L transmet amputations on 4/23. On 4/24 and 5/6, pt underwent I&D of scrotum due to dry gangrene. Pt also with Afib with RVR. PMH - Bil TKR, migraines. Patient admitted to [facility] 5/12 with OT/PT reporting cognitive deficits, therefore, SLP consulted for further evaluation.  Patient was administered the Cognistat and scored WFL on all subtests with the exception of mild deficits in orientation, calculations and short-term recall. Patient also required extra processing time throughout assessment. Both the patient and his wife endorse short-term memory deficits regarding functional information resulting in multiple repetitions needed. Patient would benefit from skilled SLP intervention to maximize his memory with use of compensatory strategies as well as his complex problem solving as patient has never had to manage multiple medications or multiple appointments. Patient and wife verbalize understanding and agreement of all information.    Skilled Therapeutic Interventions  Administered a cognitive-linguistic evaluation, please see above for details.   SLP Assessment  Patient will need skilled Speech Lanaguage Pathology Services during CIR admission    Recommendations  Oral Care Recommendations: Oral care BID Patient destination: Home Follow up Recommendations: 24 hour supervision/assistance;Home Health SLP Equipment Recommended: None recommended by SLP    SLP Frequency 3 to 5 out of 7  days   SLP Duration  SLP Intensity  SLP Treatment/Interventions 04/15/22  Minumum of 1-2 x/day, 30 to 90 minutes  Cognitive remediation/compensation;Internal/external aids;Therapeutic Activities;Cueing hierarchy;Environmental controls;Functional tasks;Patient/family education    Pain No/Denies Pain   SLP Evaluation Cognition Overall Cognitive Status: Impaired/Different from baseline Arousal/Alertness: Awake/alert Orientation Level: Oriented X4 Memory: Impaired Memory Impairment: Decreased recall of new information;Decreased short term memory Awareness: Appears intact Problem Solving: Impaired Problem Solving Impairment: Functional complex Safety/Judgment: Appears intact  Comprehension Auditory Comprehension Overall Auditory Comprehension: Appears within functional limits for tasks assessed Expression Expression Primary Mode of Expression: Verbal Verbal Expression Overall Verbal Expression: Appears within functional limits for tasks assessed Oral Motor Oral Motor/Sensory Function Overall Oral Motor/Sensory Function: Within functional limits Motor Speech Overall Motor Speech: Appears within functional limits for tasks assessed  Care Tool Care Tool Cognition Ability to hear (with hearing aid or hearing appliances if normally used Ability to hear (with hearing aid or hearing appliances if normally used): 0. Adequate - no difficulty in normal conservation, social interaction, listening to TV   Expression of Ideas and Wants Expression of Ideas and Wants: 4. Without difficulty (complex and basic) - expresses complex messages without difficulty and with speech that is clear and easy to understand   Understanding Verbal and Non-Verbal Content Understanding Verbal and Non-Verbal Content: 4. Understands (complex and basic) - clear comprehension without cues or repetitions  Memory/Recall Ability Memory/Recall Ability : Current season;Location of own room;Staff names and faces;That he  or she is in a hospital/hospital unit    Short Term Goals: Week 1: SLP Short Term Goal 1 (Week 1): STGs=LTGs due to ELOS  Refer to Care Plan for Long Term Goals  Recommendations for other services: None   Discharge Criteria: Patient will be discharged from SLP if patient refuses treatment 3 consecutive times without medical reason, if treatment goals not met, if there is a change in medical status, if patient makes no progress towards goals or if patient is discharged from hospital.  The above assessment, treatment plan, treatment alternatives and goals were discussed and mutually agreed upon: by patient and by family  Gabryel Talamo 04/08/2022, 1:36 PM

## 2022-04-08 NOTE — Plan of Care (Signed)
?  Problem: RH Problem Solving ?Goal: LTG Patient will demonstrate problem solving for (SLP) ?Description: LTG:  Patient will demonstrate problem solving for basic/complex daily situations with cues  (SLP) ?Flowsheets (Taken 04/08/2022 1336) ?LTG: Patient will demonstrate problem solving for (SLP): Complex daily situations ?LTG Patient will demonstrate problem solving for: Supervision ?  ?Problem: RH Memory ?Goal: LTG Patient will use memory compensatory aids to (SLP) ?Description: LTG:  Patient will use memory compensatory aids to recall biographical/new, daily complex information with cues (SLP) ?Flowsheets (Taken 04/08/2022 1336) ?LTG: Patient will use memory compensatory aids to (SLP): Supervision ?  ?

## 2022-04-08 NOTE — Progress Notes (Signed)
New Dialysis Start  ? ?Patient identified as new dialysis start. Kidney Education packet assembled and given. Discussed the following items with patient:   ? ?Current medications and possible changes once started:  Discussed that patient's medications may change over time.  Ex; hypertension medications and diabetes medication.  Nephrologists will adjust as needed. ? ?Fluid restrictions reviewed:  32 oz daily goal:  All liquids count; soups, ice, jello  ? ?Phosphorus and potassium: Handout given showing high potassium and phosphorus foods.  Alternative food and drink options given. ? ?Family support:  Wife present at bedside.  Wife states she will transport patient. ? ?Outpatient Clinic Resources:  Discussed roles of Outpatient clinic  staff and advised to make a list of needs, if any, to talk with outpatient staff if needed.  Patient will go to [facility]. ? ?Care plan schedule: Informed patient and family member of Care Plans in outpatient setting and to participate in the care plan.  An invitation would be given from outpatient clinic.  ? ?Dialysis Access Options:  Reviewed access options with patients. Discussed in detail about care at home with new AVG & AVF. Reviewed checking bruit and thrill. Patient currently has a catheter, educated that patient could not take showers.  Catheter dressing changes were to be done by outpatient clinic staff only ? ?Home therapy options:  Educated patient about home therapy options:  PD vs home hemo.  Patient stated she is not interested at this time in home therapies. ?  ?Patient verbalized understanding. Will continue to round on patient during admission.   ? ?Lilia Argue, RN   ?

## 2022-04-08 NOTE — Progress Notes (Signed)
Initial Nutrition Assessment  DOCUMENTATION CODES:   Not applicable  INTERVENTION:   Continue Renal Multivitamin w/ minerals daily Discontinue ProSource Plus, Nepro, and Juven Magic cup BID with meals, each supplement provides 290 kcal and 9 grams of protein Daily Snacks  NUTRITION DIAGNOSIS:   Increased nutrient needs related to acute illness, chronic illness, wound healing as evidenced by estimated needs.  GOAL:   Patient will meet greater than or equal to 90% of their needs  MONITOR:   PO intake, Supplement acceptance, Labs, Skin, Weight trends  REASON FOR ASSESSMENT:   Consult Diet education  ASSESSMENT:   61 y.o. male admitted to CIR after R BKA and L TMA 2/2 sepsis requiring pressors and methylene blue. PMH includes HTN, cirrhosis, and AKI requiring HD.   Pt laying in bed, wife at bedside.   Pt reports that his appetite has improved since being admitted to rehab. Wife endorses that pt eats 3 meals per day. Pt reports that he has been drinking the supplements but he does not like the taste of them. Per EMR, pt has not been accepting any supplement over the past few days. Discussed pt has increased needs related to wound healing, HD, and therapies; pt expressed understanding. RD to order pt snacks that he likes.   Per EMR, pt PO intake includes: 5/12: Dinner 35% 5/13: Dinner 45%, Lunch 60% 5/14: Breakfast 100% 5/15: Breakfast 25%, Lunch 100%, Dinner 100% 5/17: Breakfast 90%, Dinner 60% 5/18: Breakfast 95%, Lunch 100%  Reviewed Renal nutrition therapy with pt and pt wife, anticipating the need for HD after discharge. Discussed limiting sodium, phosphorus, and potassium. Wife reports that they have been working on cutting out the dark sodas. Reviewed protein needs related to receiving dialysis. RD to provide pt with handouts added to discharge instructions.   Medications reviewed and include: Vitamin C, Folic Acid, Lanthanum, Rena-Vit, Protonix Labs reviewed.   HD  on 5/16 Net UF: 3000 mL Post-Dialysis Weight: 97.6 kg  NUTRITION - FOCUSED PHYSICAL EXAM:  Deferred to follow-up.  Diet Order:   Diet Order             Diet renal with fluid restriction Fluid restriction: 1200 mL Fluid; Room service appropriate? Yes; Fluid consistency: Thin  Diet effective now                  EDUCATION NEEDS:   Education needs have been addressed  Skin:  Skin Assessment: Reviewed RN Assessment  Last BM:  5/17  Height:  Ht Readings from Last 1 Encounters:  04/03/22 '5\' 10"'$  (1.778 m)   Weight:  Wt Readings from Last 1 Encounters:  04/09/22 97.6 kg   BMI:  Body mass index is 30.87 kg/m.  Estimated Nutritional Needs:  Kcal:  2300-2500 Protein:  120-135 grams Fluid:  UOP + 1L   Hermina Barters RD, LDN Clinical Dietitian See Shea Evans for contact information.

## 2022-04-08 NOTE — Progress Notes (Signed)
Physical Therapy Session Note ? ?Patient Details  ?Name: Jeffrey Ayala ?MRN: [id number] ?Date of Birth: May 06, 1961 ? ?Today's Date: 04/08/2022 ?PT Individual Time: 6606-3016 ?PT Individual Time Calculation (min): 70 min  ? ?Short Term Goals: ?Week 1:  PT Short Term Goal 1 (Week 1): supine to/from sit w/min assist ?PT Short Term Goal 2 (Week 1): sliding board transfer wc to/from mat w/mod assist of 2 ?PT Short Term Goal 3 (Week 1): Pt will tolerate 2 hr oob in wc and direct care for pressure relief as needed. ?PT Short Term Goal 4 (Week 1): Pt will propel standard wc 77f w/supervision. ? ?Skilled Therapeutic Interventions/Progress Updates:  ?   ?Patient in bed upon PT arrival. Patient alert and agreeable to PT session. Patient reported 1-2/10 R 4th digit pain during session, RN made aware. PT provided repositioning, rest breaks, and distraction as pain interventions throughout session.  ?  ?Focused session on bed mobility and transfers with increased independence with management of w/c parts and board, w/c propulsion for upper extremity strengthening/endurance, and initiation of HEP. ?  ?Therapeutic Activity: ?Bed Mobility: Patient performed rolling R/L with supervision with use of bed rails while donning a brief with total A and donning shorts with supervision with min cues for threading technique. He performed supine to sit with supervision in a flat bed and supine to/from sit with supervision on a mat table with x2 attempts without use of bed rails. Provided cues for transitioning to side-lying on elbow to push up to sitting.  ?Transfers: Patient performed a slide board transfer bed>w/c and w/c<>mat table with supervision for w/c set-up, board placement, and transfer. Provided min-mod cues for w/c parts management with visual cues, hand placement, board placement, and head-hips relationship for proper technique, increased hip clearance, and decreased assist with transfers.  ? ?Therapeutic Exercise: ?Patient  performed the following exercises, provided with HEP handout, with verbal and tactile cues for proper technique. ?Access Code: [email or web address]?- Supine Quad Set (BKA)  - 2 x daily - 7 x weekly - 2 sets - 10 reps ?- Supine Gluteal Sets  - 2 x daily - 7 x weekly - 2 sets - 10 reps ?- Supine Active Straight Leg Raise  - 2 x daily - 7 x weekly - 2 sets - 10 reps ?- Supine Isometric Hip Adduction with Towel Roll (AKA)  - 2 x daily - 7 x weekly - 2 sets - 10 reps ?- Sidelying Hip Abduction (AKA)  - 2 x daily - 7 x weekly - 2 sets - 10 reps ?- Prone Knee Flexion  - 2 x daily - 7 x weekly - 2 sets - 10 reps ?- Prone Hip Extension with Residual Limb (BKA)  - 2 x daily - 7 x weekly - 2 sets - 10 reps ?- Seated Knee Extension (BKA)  - 2 x daily - 7 x weekly - 2 sets - 10 reps ?- Chair Push-Up (AKA)  - 2 x daily - 7 x weekly - 2 sets - 10 reps ?Patient propelled the w/c >250 feet for upper extremity strength/endurance with cues for scapular muscle activation for increased support for the shoulder girdle with repetitive flexion/extension ? ?Patient in w/c at end of session with breaks locked, seat belt alarm set, and all needs within reach.  ? ?Therapy Documentation ?Precautions:  ?Precautions ?Precautions: Fall, Other (comment) ?Precaution Comments: Contact ?Required Braces or Orthoses: Other Brace ?Other Brace: limb guard ?Restrictions ?Weight Bearing Restrictions: Yes ?RLE Weight Bearing: Non weight bearing ?  LLE Weight Bearing: Non weight bearing ?Other Position/Activity Restrictions: bilat LEs ? ? ? ?Therapy/Group: Individual Therapy ? ?Doreene Burke PT, DPT ? ?04/08/2022, 12:14 PM  ?

## 2022-04-08 NOTE — Progress Notes (Signed)
Occupational Therapy Session Note ? ?Patient Details  ?Name: Jeffrey Ayala ?MRN: [id number] ?Date of Birth: Sep 07, 1961 ? ?Today's Date: 04/08/2022 ?OT Individual Time: 0930-1030 ?OT Individual Time Calculation (min): 60 min  ? ?Today's Date: 04/08/2022 ?OT Individual Time: 6244-6950 ?OT Individual Time Calculation (min): 30 min  ?Short Term Goals: ?Week 1:  OT Short Term Goal 1 (Week 1): Pt will compelte SB to w/c with 1 caregiver in prep for toileting ?OT Short Term Goal 2 (Week 1): Pt will don pants at bed level with MINA ?OT Short Term Goal 3 (Week 1): Pt will lateral lean in B directions with MIN A in prep for toilet/LB dressign tasks ?OT Short Term Goal 4 (Week 1): Pt will sit EOB/EOM dyanmically with supervision for 10 min and no LOB to demo imrpoved trunk control ? ?Skilled Therapeutic Interventions/Progress Updates:  ?   ?Pt received in bed with pain only whne transferring d/t scrotal wound. Pain imporved with repositioning ?ADL: ?Pt completes toileting with overall MAX A for clothing management and cleansing posterior area. Pt completes SB transfer to/from w/c and then back to bed at end of session with CGA for downhill transfer and MIN A for uphill transfer. Pt impaired by pain in scrotum during SB transfers but with much improved lift off compaired toeval.Pt  able to lean with CGA overall to manage pants and for OT to cleanse buttocks.  ? ?Pt left at end of session in bed with exit alarm on, call light in reach and all needs met ? ?Session 2:  ?Pt received in bed agreeable to OT with no pain. Edu re skin desesitization as well as mirror therapy for phantom sensation or pain. Pt and wife receptive to education and handouts provided. Pt completes 2x10 DB therex for BUE arm strengthening required for BADLs and functional transfers: alternating arm raises, punches, curls and int/ext rotation. Exited session with pt seated in bed, exit alarm on and call light in reach ? ? ?Therapy Documentation ?Precautions:   ?Precautions ?Precautions: Fall, Other (comment) ?Precaution Comments: Contact ?Required Braces or Orthoses: Other Brace ?Other Brace: limb guard ?Restrictions ?Weight Bearing Restrictions: Yes ?RLE Weight Bearing: Non weight bearing ?LLE Weight Bearing: Non weight bearing ?Other Position/Activity Restrictions: bilat LEs ? ?  ?Other Treatments:   ? ? ?Therapy/Group: Individual Therapy ? ?Lowella Dell Tereza Gilham ?04/08/2022, 6:50 AM ?

## 2022-04-08 NOTE — Progress Notes (Signed)
?                                                       PROGRESS NOTE ? ? ?Subjective/Complaints: ?Pt seems to have had a pretty good night. Pain controlled. Wife asked about hgb level.  ? ?ROS: Patient denies fever, rash, sore throat, blurred vision, dizziness, nausea, vomiting, diarrhea, cough, shortness of breath or chest pain,  back/neck pain, headache.  ? ?  ? ?Objective: ?  ?No results found. ? ?Recent Labs  ?  04/06/22 ?2301  ?WBC 7.5  ?HGB 7.1*  ?HCT 22.5*  ?PLT 240  ? ?Recent Labs  ?  04/06/22 ?2301  ?NA 139  ?K 4.0  ?CL 105  ?CO2 21*  ?GLUCOSE 134*  ?BUN 62*  ?CREATININE 8.15*  ?CALCIUM 8.2*  ? ? ?Intake/Output Summary (Last 24 hours) at 04/08/2022 0810 ?Last data filed at 04/08/2022 0500 ?Gross per 24 hour  ?Intake --  ?Output 3200 ml  ?Net -3200 ml  ?  ? ?  ? ?Physical Exam: ?Vital Signs ?Blood pressure (!) 164/84, pulse 92, temperature 98 ?F (36.7 ?C), resp. rate 18, height '5\' 10"'$  (1.778 m), weight 98.2 kg, SpO2 97 %. ? ? ? ?Physical Exam ? ? ?Constitutional: No distress . Vital signs reviewed. ?HEENT: NCAT, EOMI, oral membranes moist ?Neck: supple ?Cardiovascular: RRR without murmur. No JVD    ?Respiratory/Chest: CTA Bilaterally without wheezes or rales. Normal effort    ?GI/Abdomen: BS +, non-tender, non-distended ?Ext: no clubbing, cyanosis, or edema ?Psych: pleasant, a little flat ?Skin: right elbow wound packed with iodoform, foam dressing on forearm. Both amputee sites cdi. Scrotum with s/s drainage, mild.  ?Neuro: alert and oriented. STM deficits ?Musc:  Wearing shrinkers ?R 4th digit cobaned ?  ? ?  ? ? ?Assessment/Plan: ?1. Functional deficits which require 3+ hours per day of interdisciplinary therapy in a comprehensive inpatient rehab setting. ?Physiatrist is providing close team supervision and 24 hour management of active medical problems listed below. ?Physiatrist and rehab team continue to assess barriers to discharge/monitor patient progress toward functional and medical goals ? ?Care  Tool: ? ?Bathing ?   ?Body parts bathed by patient: Right arm, Left arm, Chest, Abdomen, Right upper leg, Left upper leg, Face  ? Body parts bathed by helper: Front perineal area, Buttocks, Left lower leg ?Body parts n/a: Right lower leg ?  ?Bathing assist Assist Level: Moderate Assistance - Patient 50 - 74% ?  ?  ?Upper Body Dressing/Undressing ?Upper body dressing   ?What is the patient wearing?: Pull over shirt ?   ?Upper body assist Assist Level: Minimal Assistance - Patient > 75% ?   ?Lower Body Dressing/Undressing ?Lower body dressing ? ? ?   ?What is the patient wearing?: Pants ? ?  ? ?Lower body assist Assist for lower body dressing: Total Assistance - Patient < 25% ?   ? ?Toileting ?Toileting    ?Toileting assist Assist for toileting: 2 Helpers ?  ?  ?Transfers ?Chair/bed transfer ? ?Transfers assist ?   ? ?Chair/bed transfer assist level: Contact Guard/Touching assist ?  ?  ?Locomotion ?Ambulation ? ? ?Ambulation assist ? ? Ambulation activity did not occur: Safety/medical concerns ? ?  ?  ?   ? ?Walk 10 feet activity ? ? ?Assist ? Walk 10 feet activity did not occur: Safety/medical  concerns ? ?  ?   ? ?Walk 50 feet activity ? ? ?Assist Walk 50 feet with 2 turns activity did not occur: Safety/medical concerns ? ?  ?   ? ? ?Walk 150 feet activity ? ? ?Assist Walk 150 feet activity did not occur: Safety/medical concerns ? ?  ?  ?  ? ?Walk 10 feet on uneven surface  ?activity ? ? ?Assist Walk 10 feet on uneven surfaces activity did not occur: Safety/medical concerns ? ? ?  ?   ? ?Wheelchair ? ? ? ? ?Assist Is the patient using a wheelchair?: Yes ?Type of Wheelchair: Manual ?Wheelchair activity did not occur: Safety/medical concerns (unable to tolerate sitting upright in standard wc, TIS used) ? ?Wheelchair assist level: Supervision/Verbal cueing ?Max wheelchair distance: >120 ft  ? ? ?Wheelchair 50 feet with 2 turns activity ? ? ? ?Assist ? ?  ?  ? ? ?Assist Level: Supervision/Verbal cueing  ? ?Wheelchair 150  feet activity  ? ? ? ?Assist ?   ? ? ?Assist Level: Supervision/Verbal cueing  ? ?Blood pressure (!) 164/84, pulse 92, temperature 98 ?F (36.7 ?C), resp. rate 18, height '5\' 10"'$  (1.778 m), weight 98.2 kg, SpO2 97 %. ? ?Medical Problem List and Plan: ?1. Functional deficits secondary to debility, right BKA ?            -patient may shower ?            -ELOS/Goals: 14 days---suspect less. S ?            -Continue CIR therapies including PT, OT, and SLP. Added speech to address memory deficits. Spoke to pt/wife about memory, likely cause (hypoperfusion when septic), prognosis, etc.  ?2.  Antithrombotics: ?-DVT/anticoagulation:  Mechanical: Sequential compression devices, below knee Left lower extremity ?            -antiplatelet therapy: ASA  ?3. Pain Management: Oxycodone prn. ?-remains well controlled ?4. Mood/sleep: pt with some definite coping issues. Doesn't remember a lot of his acute stay either ? -team providing ego support.  ? -would like neuropsych to see ? -  melatonin '5mg'$  qhs, continue seroquel '25mg'$ , has trazodone prn ?            -antipsychotic agents: seroquel ?5. Neuropsych: This patient is somewhat capable of making decisions on his own behalf. ?6. Skin/Wound Care: Air mattress for pressure relief measures.  ?            --continue Vit C, Zinc and protein supplements to help promote healing of multiple wounds. ?            -added juven for protein calorie malnutrition.  ? -shrinkers for bilateral LE's--may need smaller one soon for bka ?7. Fluids/Electrolytes/Nutrition: Strict I/O. Monitor weights daily.  ?            --renal diet with 1200 cc/FR. Has been refusing nephro-->will try juven.  ?8. Scrotal wound infection/gangrene s/p I and D: Completes Augmentin and Zyberxa today ?            --sling scrotum for support.  ? -5/17 continue with dressing, keep clean and dry ?9. Acute kidney injury/ HD dependent--now ESRD: Still being monitored for chance of recovery ?--need strict I/O. Vein mapping completed  05/12 ?-- HD MWF at the end of the day to help with tolerance of therapy.  ?--has been Clip'd for TTS at [facility].  ?10. BLE ischemia s/p Left TMA and R-BKA: NWB BLE and continue to use Shrinkers ?  11. Acute blood loss anemia: Improved and stable. Continue Aranesp 200 mcg/wk. Had 2 units PRBC on 5/8 ? -H/H a little lower to 7.1, transfuse if needed in HD ? 5/17 spoke about causes, rx above ?12.  Afib: Monitor HR TID--continue metoprolol every 8 hours. Rate controlled ?            --no AC due to low CHADVASC score.  ?13. Metabolic encephalopathy/delirium: Has resolved. Continue Seroquel at bedtime to help with sleep.  ?14. Diarrhea: appears improved ?--dc fiber ?-continue prn's ?15. Gout: appears improved ? -On allopurinol '100mg'$  ? -Prednisone '10mg'$  for suspected gout in finger ?  -decreased to '5mg'$ ---dc after today's dose ? ?LOS: ?5 days ?A FACE TO FACE EVALUATION WAS PERFORMED ? ?Meredith Staggers ?04/08/2022, 8:10 AM  ? ? ? ?

## 2022-04-08 NOTE — Progress Notes (Signed)
Contacted [facility] to make clinic aware of pt's target d/c date of 5/24. Advised clinic that pt will start on 5/25. Will assist as needed.  ? ?Melven Sartorius ?Renal Navigator ?[phone] ?

## 2022-04-08 NOTE — Patient Care Conference (Signed)
Inpatient RehabilitationTeam Conference and Plan of Care Update ?Date: 04/07/2022   Time: 10:07 AM  ? ? ?Patient Name: Jeffrey Ayala      ?Medical Record Number: [id number]  ?Date of Birth: 27-Aug-1961 ?Sex: Male         ?Room/Bed: 4M10C/4M10C-01 ?Payor Info: Payor: Teacher, music / Plan: BCBS COMM PPO / Product Type: *No Product type* /   ? ?Admit Date/Time:  04/03/2022  2:43 PM ? ?Primary Diagnosis:  S/P BKA (below knee amputation), right ([place]) ? ?Hospital Problems: Principal Problem: ?  S/P BKA (below knee amputation), right ([place]) ?Active Problems: ?  Unilateral complete BKA, right, initial encounter ([place]) ?  Arthralgia of right lower leg ?  Acute blood loss anemia ? ? ? ?Expected Discharge Date: Expected Discharge Date: 04/15/22 ? ?Team Members Present: ?Physician leading conference: Dr. Alger Simons ?Social Worker Present: Loralee Pacas, LCSWA ?Nurse Present: Dorthula Nettles, RN ?PT Present: Apolinar Junes, PT ?OT Present: Mariane Masters, OT ?PPS Coordinator present : Gunnar Fusi, SLP ? ?   Current Status/Progress Goal Weekly Team Focus  ?Bowel/Bladder ? ? Renal patient Tues/Th/Sat/  regain continence  QS/PRN assessment   ?Swallow/Nutrition/ Hydration ? ?           ?ADL's ? ? MAX A ADLs, +2 trasnfers, high pain impacting mobiltiy/postural control, poor conditioning, flat affect with delayed processing and poor memory  supervision  sitting balnace, BUE strengthening, core strengthening, postural control, functional transfers, BADL retraining   ?Mobility ? ? Min A-CGA bed mobility and slide board transfers, supervision w/c mobility >100 ft  Mod I overall, w/c level  Functional mobility, activity tolerance, strength/ROM, w/c mobility, w/c evaluation, amputee education, patient/caregiver education.   ?Communication ? ?           ?Safety/Cognition/ Behavioral Observations ?           ?Pain ? ? pain 7-8/10 BLE s/p Right  BKA  <3  assess pain q 4hr and prn   ?Skin ? ? incisions, MASD, skin tears   no new skin breakdown  assess skin q shift and prn   ? ? ?Discharge Planning:  ?  Discussing home with spouse. Has good support from neighbors and church.   ?Team Discussion: ?Sepsis, multiple wounds. Right BKA, left transmetatarsal amputation. Short-term memory. Wound care, sleep issues. Incontinent B/B with continent episodes. Reports no pain. Addressing all skin issues. Has great support, church family, neighbors, and ramp in place. SLP eval requested.  ?  ?Patient on target to meet rehab goals: ?yes, supervision goals. Currently on tract to meet goals. Has a difficult time lifting hips due to BUE weakness. Follows precautions. Educating on heel/ankle flexion. ? ?*See Care Plan and progress notes for long and short-term goals.  ? ?Revisions to Treatment Plan:  ?Adjusting medications as needed. ?  ?Teaching Needs: ?Family education, medication management, skin/wound care, transfer/gait training, etc. ?  ?Current Barriers to Discharge: ?Home enviroment access/layout, Wound care, Weight, and Weight bearing restrictions ? ?Possible Resolutions to Barriers: ?Family education ?Follow-up therapy ?Order recommended DME ?  ? ? Medical Summary ?Current Status: severe sepsis with gangrene. likely anoxic injury too. righ tma, left bka, multilple wounds ? Barriers to Discharge: Medical stability;Wound care ?  ?Possible Resolutions to [employer]: daily assessment of labs and pt data, wound care changes ? ? ?Continued Need for Acute Rehabilitation Level of Care: The patient requires daily medical management by a physician with specialized training in physical medicine and rehabilitation for the following reasons: ?Direction  of a multidisciplinary physical rehabilitation program to maximize functional independence : Yes ?Medical management of patient stability for increased activity during participation in an intensive rehabilitation regime.: Yes ?Analysis of laboratory values and/or radiology reports with any  subsequent need for medication adjustment and/or medical intervention. : Yes ? ? ?I attest that I was present, lead the team conference, and concur with the assessment and plan of the team. ? ? ?Dorthula Nettles G ?04/08/2022, 11:35 AM  ? ? ? ? ? ? ?

## 2022-04-08 NOTE — Progress Notes (Signed)
[facility] ?NEPHROLOGY PROGRESS NOTE ? ?Assessment/ Plan: ? ?# Acute kidney Injury: ATN from severe septic shock in the setting of foot wounds. He has been on dialysis dependent since 02/26/2022 (initially started with CRRT and transition to IHD with first tx 4/12).  Vein mapping done.  They would really like to get him through rehab stay before doing an AVF ?- HD per TTS schedule. We have clipped as AKI for now and watching for recovery, though may be end stage given prolonged dependence on dialysis.  He has an outpatient HD unit (as an AKI) at [facility] on TTS schedule ?- before discharge will need to ensure that he is able to tolerate HD in a chair.   ?-Status post dialysis yesterday with around 3 L ultrafiltration, tolerated well.  Next HD tomorrow. ? ?# Scrotal edema/wound: Status post surgical debridement by urology on 4/24 and on 5/6 underwent I&D of scrotum with wound closure and drain placement.   ? ?# Leg wound status post left TMA and right below-knee amputation on 03/15/2022: Septic shock resolved.  ?  ?# Anemia - acute blood loss: s/p PRBC's multiple units. CT abd was negative for acute process.  Iron level acceptable and currently on iron as 200 mcg.  Monitor hemoglobin. ? 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  Monitor calcium, phosphorus. ? ?Subjective: Seen and examined in inpatient rehab.  No new event.  Denies nausea vomiting chest pain shortness of breath.  Discussed with patient's wife as well. ?Objective ?Vital signs in last 24 hours: ?Vitals:  ? 04/07/22 1831 04/07/22 2004 04/08/22 0500 04/08/22 0535  ?BP: 140/85 134/78  (!) 164/84  ?Pulse: 98 (!) 103  92  ?Resp: '18 18  18  '$ ?Temp: 98.4 ?F (36.9 ?C) 98.4 ?F (36.9 ?C)  98 ?F (36.7 ?C)  ?TempSrc:      ?SpO2: 100% 99%  97%  ?Weight:   98.2 kg   ?Height:      ? ?Weight change:  ? ?Intake/Output Summary (Last 24 hours) at 04/08/2022 0838 ?Last data filed at 04/08/2022 0500 ?Gross per 24 hour  ?Intake --  ?Output 3200 ml  ?Net -3200 ml   ? ? ? ? ? ? ?Labs: ?Basic Metabolic Panel: ?Recent Labs  ?Lab 04/03/22 ?0202 04/04/22 ?1439 04/06/22 ?2301  ?NA 137 140 139  ?K 3.7 3.4* 4.0  ?CL 103 102 105  ?CO2 22 26 21*  ?GLUCOSE 111* 124* 134*  ?BUN 47* 42* 62*  ?CREATININE 6.73* 5.51* 8.15*  ?CALCIUM 8.3* 8.4* 8.2*  ?PHOS 5.9* 4.1 6.7*  ? ? ?Liver Function Tests: ?Recent Labs  ?Lab 04/03/22 ?0202 04/04/22 ?1439 04/06/22 ?2301  ?ALBUMIN 2.4* 2.6* 2.6*  ? ? ?No results for input(s): LIPASE, AMYLASE in the last 168 hours. ?No results for input(s): AMMONIA in the last 168 hours. ?CBC: ?Recent Labs  ?Lab 04/04/22 ?1441 04/06/22 ?2301  ?WBC 8.5 7.5  ?HGB 7.6* 7.1*  ?HCT 22.6* 22.5*  ?MCV 91.5 93.4  ?PLT 231 240  ? ? ?Cardiac Enzymes: ?No results for input(s): CKTOTAL, CKMB, CKMBINDEX, TROPONINI in the last 168 hours. ?CBG: ?Recent Labs  ?Lab 04/02/22 ?1554 04/02/22 ?2107 04/03/22 ?7035 04/03/22 ?1131 04/07/22 ?0093  ?GLUCAP 135* 144* 109* 116* 105*  ? ? ? ?Iron Studies: No results for input(s): IRON, TIBC, TRANSFERRIN, FERRITIN in the last 72 hours. ?Studies/Results: ?No results found. ? ?Medications: ?Infusions: ? ? ? ?Scheduled Medications: ? (feeding supplement) PROSource Plus  30 mL Oral TID BM  ? acetaminophen  650 mg Oral Q6H  ?  allopurinol  100 mg Oral Daily  ? vitamin C  1,000 mg Oral Daily  ? aspirin  81 mg Oral Daily  ? Chlorhexidine Gluconate Cloth  6 each Topical Q0600  ? Chlorhexidine Gluconate Cloth  6 each Topical Q0600  ? darbepoetin (ARANESP) injection - DIALYSIS  200 mcg Intravenous Q Tue-HD  ? feeding supplement (NEPRO CARB STEADY)  237 mL Oral BID BM  ? folic acid  1 mg Oral Daily  ? guaiFENesin  400 mg Oral Q8H  ? lanthanum  1,000 mg Oral TID WC  ? melatonin  5 mg Oral QHS  ? metoprolol tartrate  37.5 mg Oral TID  ? multivitamin  1 tablet Oral QHS  ? nutrition supplement (JUVEN)  1 packet Oral BID BM  ? pantoprazole  40 mg Oral Daily  ? QUEtiapine  25 mg Oral QHS  ? ? have reviewed scheduled and prn medications. ? ?Physical Exam: ?General: Not  in distress, comfortable. ?Heart:RRR, s1s2 nl ?Lungs: Clear b/l, no crackle ?Abdomen:soft, Non-tender, non-distended ?Extremities:No edema, left TMA and right BKA. ?Dialysis Access: Right IJ TDC. ? ?[place] ?04/08/2022,8:38 AM ? LOS: 5 days  ? ?

## 2022-04-09 MED ORDER — MEDIHONEY WOUND/BURN DRESSING EX PSTE
1.0000 | PASTE | Freq: Every day | CUTANEOUS | Status: DC
Start: 2022-04-09 — End: 2022-04-15
  Administered 2022-04-09 – 2022-04-15 (×7): 1 via TOPICAL
  Filled 2022-04-09: qty 44

## 2022-04-09 MED ORDER — HEPARIN SODIUM (PORCINE) 1000 UNIT/ML IJ SOLN
INTRAMUSCULAR | Status: AC
  Filled 2022-04-09: qty 4

## 2022-04-09 NOTE — Progress Notes (Signed)
Occupational Therapy Session Note  Patient Details  Name: Jeffrey Ayala MRN: [id number] Date of Birth: Jan 04, 1961  Today's Date: 04/09/2022 OT Individual Time: 1106-1200 OT Individual Time Calculation (min): 54 min    Short Term Goals: Week 1:  OT Short Term Goal 1 (Week 1): Pt will compelte SB to w/c with 1 caregiver in prep for toileting OT Short Term Goal 2 (Week 1): Pt will don pants at bed level with MINA OT Short Term Goal 3 (Week 1): Pt will lateral lean in B directions with MIN A in prep for toilet/LB dressign tasks OT Short Term Goal 4 (Week 1): Pt will sit EOB/EOM dyanmically with supervision for 10 min and no LOB to demo imrpoved trunk control  Skilled Therapeutic Interventions/Progress Updates:    Pt received sitting up in the recliner with no c/o pain, agreeable to OT session. Pt now has lightweight w/c in his room and it had no anti tippers so a pair were put on for safety. Pt completed slideboard transfer from the recliner with min A d/t poor positioning of the recliner and w/c. He completed w/c propulsion to the therapy gym with (S). He transferred to the mat via SB with CGA. He completed several core stabilization exercises to increase sitting balance and provide more stable BOS. Tricep push ups from yoga blocked with 75% tricep ext d/t weakness, 3x8 repetitions. Pt was left supine with all needs met call bell within reach.    Therapy Documentation Precautions:  Precautions Precautions: Fall, Other (comment) Precaution Comments: Contact Required Braces or Orthoses: Other Brace Other Brace: limb guard Restrictions Weight Bearing Restrictions: Yes RLE Weight Bearing: Non weight bearing LLE Weight Bearing: Non weight bearing Other Position/Activity Restrictions: bilat LEs   Therapy/Group: Individual Therapy  Curtis Sites 04/09/2022, 6:53 AM

## 2022-04-09 NOTE — Progress Notes (Signed)
Speech Language Pathology Daily Session Note  Patient Details  Name: Jeffrey Ayala MRN: [id number] Date of Birth: July 10, 1961  Today's Date: 04/09/2022 SLP Individual Time: 1003-1100 SLP Individual Time Calculation (min): 57 min  Short Term Goals: Week 1: SLP Short Term Goal 1 (Week 1): STGs=LTGs due to ELOS  Skilled Therapeutic Interventions: Skilled ST treatment focused on cognitive goals. Pt received in recliner accompanied by spouse, Jeffrey Ayala. SLP facilitated session by providing education on internal and external memory strategies, as well as WRAP strategies (i.e., write it down, repeat, associate, picture it). Handouts provided to reinforce topics discussed. Pt verbalized and demonstrated understanding of these concepts with min A verbal cues for elaboration. Following education, spouse provided helpful cueing throughout session to promote pt's independence with recalling recent information. Pt with flat affect and appearing somewhat disinterested, however suspect this may relate to decreased selective attention and/or fatigue by end of session. Patient was left in recliner with alarm activated and immediate needs within reach at end of session. Continue per current plan of care.       Pain Pain Assessment Pain Scale: 0-10 Pain Score: 0-No pain  Therapy/Group: Individual Therapy  Jeffrey Ayala 04/09/2022, 10:59 AM

## 2022-04-09 NOTE — Progress Notes (Signed)
PROGRESS NOTE   Subjective/Complaints: Pt is sleeping better. No new complaints today. Denies pain. Up in gym with PT  ROS: Patient denies fever, rash, sore throat, blurred vision, dizziness, nausea, vomiting, diarrhea, cough, shortness of breath or chest pain, joint or back/neck pain, headache, or mood change.      Objective:   No results found.  Recent Labs    04/06/22 2301  WBC 7.5  HGB 7.1*  HCT 22.5*  PLT 240   Recent Labs    04/06/22 2301  NA 139  K 4.0  CL 105  CO2 21*  GLUCOSE 134*  BUN 62*  CREATININE 8.15*  CALCIUM 8.2*    Intake/Output Summary (Last 24 hours) at 04/09/2022 0909 Last data filed at 04/09/2022 0032 Gross per 24 hour  Intake 480 ml  Output 300 ml  Net 180 ml        Physical Exam: Vital Signs Blood pressure 138/82, pulse 97, temperature 98.9 F (37.2 C), temperature source Oral, resp. rate 15, height '5\' 10"'$  (1.778 m), weight 97.6 kg, SpO2 100 %.    Physical Exam   Constitutional: No distress . Vital signs reviewed. HEENT: NCAT, EOMI, oral membranes moist Neck: supple Cardiovascular: RRR without murmur. No JVD    Respiratory/Chest: CTA Bilaterally without wheezes or rales. Normal effort    GI/Abdomen: BS +, non-tender, non-distended Ext: no clubbing, cyanosis, or edema Psych: pleasant and cooperative  Skin: right elbow wound dressed foam dressing but NO PACKING, foam on forearm. Both amputee sites cdi. Scrotum with s/s drainage,dressed.  Neuro: alert and oriented. STM deficits Musc:  Wearing shrinkers bilaterally R 4th digit cobaned        Assessment/Plan: 1. Functional deficits which require 3+ hours per day of interdisciplinary therapy in a comprehensive inpatient rehab setting. Physiatrist is providing close team supervision and 24 hour management of active medical problems listed below. Physiatrist and rehab team continue to assess barriers to discharge/monitor  patient progress toward functional and medical goals  Care Tool:  Bathing    Body parts bathed by patient: Right arm, Left arm, Chest, Abdomen, Right upper leg, Left upper leg, Face   Body parts bathed by helper: Front perineal area, Buttocks, Left lower leg Body parts n/a: Right lower leg   Bathing assist Assist Level: Moderate Assistance - Patient 50 - 74%     Upper Body Dressing/Undressing Upper body dressing   What is the patient wearing?: Pull over shirt    Upper body assist Assist Level: Minimal Assistance - Patient > 75%    Lower Body Dressing/Undressing Lower body dressing      What is the patient wearing?: Pants     Lower body assist Assist for lower body dressing: Total Assistance - Patient < 25%     Toileting Toileting    Toileting assist Assist for toileting: 2 Helpers     Transfers Chair/bed transfer  Transfers assist     Chair/bed transfer assist level: Contact Guard/Touching assist     Locomotion Ambulation   Ambulation assist   Ambulation activity did not occur: Safety/medical concerns          Walk 10 feet activity   Assist  Walk  10 feet activity did not occur: Safety/medical concerns        Walk 50 feet activity   Assist Walk 50 feet with 2 turns activity did not occur: Safety/medical concerns         Walk 150 feet activity   Assist Walk 150 feet activity did not occur: Safety/medical concerns         Walk 10 feet on uneven surface  activity   Assist Walk 10 feet on uneven surfaces activity did not occur: Safety/medical concerns         Wheelchair     Assist Is the patient using a wheelchair?: Yes Type of Wheelchair: Manual Wheelchair activity did not occur: Safety/medical concerns (unable to tolerate sitting upright in standard wc, TIS used)  Wheelchair assist level: Supervision/Verbal cueing Max wheelchair distance: >120 ft    Wheelchair 50 feet with 2 turns activity    Assist         Assist Level: Supervision/Verbal cueing   Wheelchair 150 feet activity     Assist      Assist Level: Supervision/Verbal cueing   Blood pressure 138/82, pulse 97, temperature 98.9 F (37.2 C), temperature source Oral, resp. rate 15, height '5\' 10"'$  (1.778 m), weight 97.6 kg, SpO2 100 %.  Medical Problem List and Plan: 1. Functional deficits secondary to debility, right BKA             -patient may shower             -ELOS/Goals:  5/24 S             -Continue CIR therapies including PT, OT, and SLP , w/c eval per PT 2.  Antithrombotics: -DVT/anticoagulation:  Mechanical: Sequential compression devices, below knee Left lower extremity             -antiplatelet therapy: ASA  3. Pain Management: Oxycodone prn. -remains well controlled 4. Mood/sleep: pt with some definite coping issues. Doesn't remember a lot of his acute stay either  -team providing ego support.   -would like neuropsych to see  -  melatonin '5mg'$  qhs, continue seroquel '25mg'$ , has trazodone prn   =sleep improved             -antipsychotic agents: seroquel 5. Neuropsych: This patient is somewhat capable of making decisions on his own behalf. 6. Skin/Wound Care: Air mattress for pressure relief measures.              --continue Vit C, Zinc and protein supplements to help promote healing of multiple wounds.             -added juven for protein calorie malnutrition.   -shrinkers for bilateral LE's--may need smaller one soon for bka  -elbow wound needs santyl and packing 7. Fluids/Electrolytes/Nutrition: Strict I/O. Monitor weights daily.              --renal diet with 1200 cc/FR. Has been refusing nephro-->will try juven.  8. Scrotal wound infection/gangrene s/p I and D: Completes Augmentin and Zyberxa today             --sling scrotum for support.   -5/18 continue with dressing, keep clean and dry 9. Acute kidney injury/ HD dependent--now ESRD: Still being monitored for chance of recovery --need strict I/O. Vein  mapping completed 05/12 -- HD MWF at the end of the day to help with tolerance of therapy.  --has been Clip'd for TTS at [facility].  10. BLE ischemia s/p Left TMA and R-BKA: NWB BLE  and continue to use Shrinkers 11. Acute blood loss anemia: Improved and stable. Continue Aranesp 200 mcg/wk. Had 2 units PRBC on 5/8  -H/H   7.1, transfuse if needed in HD  5/18 have spoken about causes, rx above 12.  Afib: Monitor HR TID--continue metoprolol every 8 hours. Rate controlled             --no AC due to low CHADVASC score.  13. Metabolic encephalopathy/delirium: Has resolved. Continue Seroquel at bedtime to help with sleep.  14. Diarrhea: appears improved --dc fiber -continue prn's 15. Gout: appears improved  -On allopurinol '100mg'$   -Prednisone weaned off  LOS: 6 days A FACE TO FACE EVALUATION WAS PERFORMED  Meredith Staggers 04/09/2022, 9:09 AM

## 2022-04-09 NOTE — Progress Notes (Signed)
[facility] NEPHROLOGY PROGRESS NOTE  Assessment/ Plan:  # Acute kidney Injury: ATN from severe septic shock in the setting of foot wounds. He has been on dialysis dependent since 02/26/2022 (initially started with CRRT and transition to IHD with first tx 4/12).  Vein mapping done.  They would really like to get him through rehab stay before doing an AVF - HD per TTS schedule. We have clipped as AKI for now and watching for recovery, though may be end stage given prolonged dependence on dialysis.  He has an outpatient HD unit (as an AKI) at [facility] on TTS schedule - before discharge will need to ensure that he is able to tolerate HD in a chair.   -Plan for dialysis today.  # Scrotal edema/wound: Status post surgical debridement by urology on 4/24 and on 5/6 underwent I&D of scrotum with wound closure and drain placement.    # Leg wound status post left TMA and right below-knee amputation on 03/15/2022: Septic shock resolved.    # Anemia - acute blood loss: s/p PRBC's multiple units. CT abd was negative for acute process.  Iron level acceptable and currently on Aranesp 200 mcg.  Monitor hemoglobin.  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  Monitor calcium, phosphorus.  Subjective: Seen and examined in inpatient rehab.  He is working with therapist.  No new event.  Denies any complaint.  Objective Vital signs in last 24 hours: Vitals:   04/08/22 0535 04/08/22 1442 04/08/22 2016 04/09/22 0435  BP: (!) 164/84 131/72 138/82   Pulse: 92 99 97   Resp: 18 (!) 21 15   Temp: 98 F (36.7 C) 98.7 F (37.1 C) 98.9 F (37.2 C)   TempSrc:  Oral Oral   SpO2: 97% 100% 100%   Weight:    97.6 kg  Height:       Weight change: -3 kg  Intake/Output Summary (Last 24 hours) at 04/09/2022 0954 Last data filed at 04/09/2022 0700 Gross per 24 hour  Intake 360 ml  Output 300 ml  Net 60 ml        Labs: Basic Metabolic Panel: Recent Labs  Lab 04/03/22 0202 04/04/22 1439 04/06/22 2301  NA  137 140 139  K 3.7 3.4* 4.0  CL 103 102 105  CO2 22 26 21*  GLUCOSE 111* 124* 134*  BUN 47* 42* 62*  CREATININE 6.73* 5.51* 8.15*  CALCIUM 8.3* 8.4* 8.2*  PHOS 5.9* 4.1 6.7*    Liver Function Tests: Recent Labs  Lab 04/03/22 0202 04/04/22 1439 04/06/22 2301  ALBUMIN 2.4* 2.6* 2.6*    No results for input(s): LIPASE, AMYLASE in the last 168 hours. No results for input(s): AMMONIA in the last 168 hours. CBC: Recent Labs  Lab 04/04/22 1441 04/06/22 2301  WBC 8.5 7.5  HGB 7.6* 7.1*  HCT 22.6* 22.5*  MCV 91.5 93.4  PLT 231 240    Cardiac Enzymes: No results for input(s): CKTOTAL, CKMB, CKMBINDEX, TROPONINI in the last 168 hours. CBG: Recent Labs  Lab 04/02/22 1554 04/02/22 2107 04/03/22 0620 04/03/22 1131 04/07/22 0626  GLUCAP 135* 144* 109* 116* 105*     Iron Studies: No results for input(s): IRON, TIBC, TRANSFERRIN, FERRITIN in the last 72 hours. Studies/Results: No results found.  Medications: Infusions:    Scheduled Medications:  (feeding supplement) PROSource Plus  30 mL Oral TID BM   acetaminophen  650 mg Oral Q6H   allopurinol  100 mg Oral Daily   vitamin C  1,000 mg Oral  Daily   aspirin  81 mg Oral Daily   Chlorhexidine Gluconate Cloth  6 each Topical BID   darbepoetin (ARANESP) injection - DIALYSIS  200 mcg Intravenous Q Tue-HD   feeding supplement (NEPRO CARB STEADY)  237 mL Oral BID BM   folic acid  1 mg Oral Daily   guaiFENesin  400 mg Oral Q8H   lanthanum  1,000 mg Oral TID WC   leptospermum manuka honey  1 application. Topical Daily   melatonin  5 mg Oral QHS   metoprolol tartrate  37.5 mg Oral TID   multivitamin  1 tablet Oral QHS   nutrition supplement (JUVEN)  1 packet Oral BID BM   pantoprazole  40 mg Oral Daily   QUEtiapine  25 mg Oral QHS    have reviewed scheduled and prn medications.  Physical Exam: General: Not in distress, comfortable. Heart:RRR, s1s2 nl Lungs: Clear b/l, no crackle Abdomen:soft, Non-tender,  non-distended Extremities:No edema, left TMA and right BKA. Dialysis Access: Right IJ TDC.  Napoleon Monacelli Prasad Noboru Bidinger 04/09/2022,9:54 AM  LOS: 6 days

## 2022-04-09 NOTE — Progress Notes (Signed)
Patient ID: Jeffrey Ayala, male   DOB: 16-Nov-1961, 61 y.o.   MRN: [id number]  SW met with pt and pt wife in room to provide appointment card. Pt sleeping. Family edu rescheduled for Monday (5/22) 9am-12pm. Wife will be here for w/c eval tomorrow as well.   Loralee Pacas, MSW, [place] Office: [phone] Cell: [phone] Fax: [phone]

## 2022-04-09 NOTE — Progress Notes (Signed)
Physical Therapy Session Note  Patient Details  Name: Jeffrey Ayala MRN: [id number] Date of Birth: 09-Jul-1961  Today's Date: 04/09/2022 PT Individual Time: 0800-0915 PT Individual Time Calculation (min): 75 min   Short Term Goals: Week 1:  PT Short Term Goal 1 (Week 1): supine to/from sit w/min assist PT Short Term Goal 2 (Week 1): sliding board transfer wc to/from mat w/mod assist of 2 PT Short Term Goal 3 (Week 1): Pt will tolerate 2 hr oob in wc and direct care for pressure relief as needed. PT Short Term Goal 4 (Week 1): Pt will propel standard wc 65f w/supervision.  Skilled Therapeutic Interventions/Progress Updates:     Patient in bed with his wife arriving to the room upon PT arrival. Patient alert and agreeable to PT session. Patient denied pain during session, except "burning" scrotal pain during first slide board transfer, improved with cues to lift hips off the board during transfers.  Focused session on upper extremity strengthening/endurance, wheelchair assessment, light weight vs ultra light, and transfer training, including car transfer and A/P transfers.   Therapeutic Activity: Bed Mobility: Patient performed supine to/from sit with supervision in a flat bed without use of bed rails with increased effort to come to sitting. Provided verbal cues for setting bottom elbow to push up to sitting. Patient donned shorts in sitting with cues for lateral leaning to pull pants over hips focused on core activation and dynamic sitting balance.  Transfers: Patient performed a slide board transfer bed<>w/c with CGA-supervision and mod A for board placement, initially attempting uphill transfer, but increased scrotal pain noted and adjusted bed height to level transfer. Provided cues for hand placement, board placement, and head-hips relationship for proper technique and decreased assist with transfers.  Patient performed a simulated sedan ([place]) height car transfer with min A using a  slide board. Provided cues for safe technique, w/c set-up, board placement, and hand placement. Patient performed A/P transfer bed>recliner with CGA-supervision with PT stabilizing the chair. Provided cues for reciprocal scooting throughout.  Wheelchair Mobility:  Patient propelled wheelchair >150 feet x2 with supervision-mod I. Provided verbal cues for turning technique x1 and min cues for management of w/c parts. Applied colored coban to arm rest release and leg rest latches to improve recall of location.  2 Min WEnvironmental consultant Light weight wheelchair x80 feet Ultra light wheelchair x172 feet  Therapeutic Exercise: Patient performed the following exercises with verbal and tactile cues for proper technique. -UE ergometer x10 min forwards at level 1-2 and x10 min backwards at level 2-3 for upper extremity strengthening/endurance  Patient in recliner with his wife and LPN in the room at end of session with breaks locked and all needs within reach.   Therapy Documentation Precautions:  Precautions Precautions: Fall, Other (comment) Precaution Comments: Contact Required Braces or Orthoses: Other Brace Other Brace: limb guard Restrictions Weight Bearing Restrictions: Yes RLE Weight Bearing: Non weight bearing LLE Weight Bearing: Non weight bearing Other Position/Activity Restrictions: bilat LEs    Therapy/Group: Individual Therapy  Bernetta Sutley L Vondra Aldredge PT, DPT  04/09/2022, 4:33 PM

## 2022-04-10 ENCOUNTER — Inpatient Hospital Stay: Payer: BC Managed Care – PPO | Admitting: Internal Medicine

## 2022-04-10 DIAGNOSIS — R110 Nausea: Secondary | ICD-10-CM | POA: Insufficient documentation

## 2022-04-10 DIAGNOSIS — T7840XA Allergy, unspecified, initial encounter: Secondary | ICD-10-CM | POA: Insufficient documentation

## 2022-04-10 DIAGNOSIS — D689 Coagulation defect, unspecified: Secondary | ICD-10-CM | POA: Insufficient documentation

## 2022-04-10 DIAGNOSIS — E039 Hypothyroidism, unspecified: Secondary | ICD-10-CM | POA: Insufficient documentation

## 2022-04-10 DIAGNOSIS — R0600 Dyspnea, unspecified: Secondary | ICD-10-CM | POA: Insufficient documentation

## 2022-04-10 DIAGNOSIS — D509 Iron deficiency anemia, unspecified: Secondary | ICD-10-CM | POA: Insufficient documentation

## 2022-04-10 DIAGNOSIS — N178 Other acute kidney failure: Secondary | ICD-10-CM | POA: Insufficient documentation

## 2022-04-10 DIAGNOSIS — N2581 Secondary hyperparathyroidism of renal origin: Secondary | ICD-10-CM | POA: Insufficient documentation

## 2022-04-10 DIAGNOSIS — T782XXA Anaphylactic shock, unspecified, initial encounter: Secondary | ICD-10-CM | POA: Insufficient documentation

## 2022-04-10 DIAGNOSIS — K200 Eosinophilic esophagitis: Secondary | ICD-10-CM | POA: Insufficient documentation

## 2022-04-10 DIAGNOSIS — K259 Gastric ulcer, unspecified as acute or chronic, without hemorrhage or perforation: Secondary | ICD-10-CM | POA: Insufficient documentation

## 2022-04-10 DIAGNOSIS — L299 Pruritus, unspecified: Secondary | ICD-10-CM | POA: Insufficient documentation

## 2022-04-10 MED ORDER — CHLORHEXIDINE GLUCONATE CLOTH 2 % EX PADS
6.0000 | MEDICATED_PAD | Freq: Every day | CUTANEOUS | Status: DC
  Administered 2022-04-10: 6 via TOPICAL

## 2022-04-10 NOTE — Progress Notes (Signed)
PROGRESS NOTE   Subjective/Complaints: Had a pretty good night. No new complaints. Pain controlled. Wife asked when staples might come out.   ROS: Patient denies fever, rash, sore throat, blurred vision, dizziness, nausea, vomiting, diarrhea, cough, shortness of breath or chest pain,  back/neck pain, headache, or mood change.      Objective:   No results found.  No results for input(s): WBC, HGB, HCT, PLT in the last 72 hours.  No results for input(s): NA, K, CL, CO2, GLUCOSE, BUN, CREATININE, CALCIUM in the last 72 hours.   Intake/Output Summary (Last 24 hours) at 04/10/2022 1040 Last data filed at 04/10/2022 0038 Gross per 24 hour  Intake --  Output 3000 ml  Net -3000 ml        Physical Exam: Vital Signs Blood pressure 123/85, pulse 89, temperature 98.4 F (36.9 C), resp. rate 18, height '5\' 10"'$  (1.778 m), weight 97.6 kg, SpO2 99 %.    Physical Exam   Constitutional: No distress . Vital signs reviewed. HEENT: NCAT, EOMI, oral membranes moist Neck: supple Cardiovascular: RRR without murmur. No JVD    Respiratory/Chest: CTA Bilaterally without wheezes or rales. Normal effort    GI/Abdomen: BS +, non-tender, non-distended Ext: no clubbing, cyanosis, or edema Psych: pleasant and cooperative  Skin: right elbow wound dressed foam dressing but STILL NO PACKING, foam on forearm. Both amputation sites remain cdi. Scrotum with s/s drainage,dressed.  Neuro: alert and oriented. STM deficits present, reasonable insight and awareness.  Musc:  Wearing shrinkers bilaterally R 4th digit cobaned        Assessment/Plan: 1. Functional deficits which require 3+ hours per day of interdisciplinary therapy in a comprehensive inpatient rehab setting. Physiatrist is providing close team supervision and 24 hour management of active medical problems listed below. Physiatrist and rehab team continue to assess barriers to  discharge/monitor patient progress toward functional and medical goals  Care Tool:  Bathing    Body parts bathed by patient: Right arm, Left arm, Chest, Abdomen, Right upper leg, Left upper leg, Face   Body parts bathed by helper: Front perineal area, Buttocks, Left lower leg Body parts n/a: Right lower leg   Bathing assist Assist Level: Moderate Assistance - Patient 50 - 74%     Upper Body Dressing/Undressing Upper body dressing   What is the patient wearing?: Pull over shirt    Upper body assist Assist Level: Minimal Assistance - Patient > 75%    Lower Body Dressing/Undressing Lower body dressing      What is the patient wearing?: Pants     Lower body assist Assist for lower body dressing: Total Assistance - Patient < 25%     Toileting Toileting    Toileting assist Assist for toileting: 2 Helpers     Transfers Chair/bed transfer  Transfers assist     Chair/bed transfer assist level: Supervision/Verbal cueing     Locomotion Ambulation   Ambulation assist   Ambulation activity did not occur: Safety/medical concerns          Walk 10 feet activity   Assist  Walk 10 feet activity did not occur: Safety/medical concerns        Walk 50  feet activity   Assist Walk 50 feet with 2 turns activity did not occur: Safety/medical concerns         Walk 150 feet activity   Assist Walk 150 feet activity did not occur: Safety/medical concerns         Walk 10 feet on uneven surface  activity   Assist Walk 10 feet on uneven surfaces activity did not occur: Safety/medical concerns         Wheelchair     Assist Is the patient using a wheelchair?: Yes Type of Wheelchair: Manual Wheelchair activity did not occur: Safety/medical concerns (unable to tolerate sitting upright in standard wc, TIS used)  Wheelchair assist level: Supervision/Verbal cueing Max wheelchair distance: >150 ft    Wheelchair 50 feet with 2 turns  activity    Assist        Assist Level: Supervision/Verbal cueing   Wheelchair 150 feet activity     Assist      Assist Level: Supervision/Verbal cueing   Blood pressure 123/85, pulse 89, temperature 98.4 F (36.9 C), resp. rate 18, height '5\' 10"'$  (1.778 m), weight 97.6 kg, SpO2 99 %.  Medical Problem List and Plan: 1. Functional deficits secondary to debility, right BKA             -patient may shower             -ELOS/Goals:  5/24 S             -Continue CIR therapies including PT, OT, and SLP, w/c eval per PT 2.  Antithrombotics: -DVT/anticoagulation:  Mechanical: Sequential compression devices, below knee Left lower extremity             -antiplatelet therapy: ASA  3. Pain Management: Oxycodone prn. -remains well controlled 4. Mood/sleep: pt with some definite coping issues. Doesn't remember a lot of his acute stay either  -team providing ego support.   -would like neuropsych to see  -  melatonin '5mg'$  qhs, continue seroquel '25mg'$ , has trazodone prn   -sleep is better             -antipsychotic agents: seroquel 5. Neuropsych: This patient is somewhat capable of making decisions on his own behalf. 6. Skin/Wound Care: Air mattress for pressure relief measures.              --continue Vit C, Zinc and protein supplements to help promote healing of multiple wounds.             -added juven for protein calorie malnutrition.   -shrinkers for bilateral LE's--may need smaller one soon for bka  -elbow wound needs santyl and packing-followed up with nurse today 5/19  -staples have been in almost 4 weeks---remove Monday 5/22 7. Fluids/Electrolytes/Nutrition: Strict I/O. Monitor weights daily.              --renal diet with 1200 cc/FR. Has been refusing nephro-->will try juven.  8. Scrotal wound infection/gangrene s/p I and D: Completes Augmentin and Zyberxa today             --sling scrotum for support.   -5/18 continue with dressing, keep clean and dry 9. Acute kidney  injury/ HD dependent--now ESRD: Still being monitored for chance of recovery --need strict I/O. Vein mapping completed 05/12 -- HD MWF at the end of the day to help with tolerance of therapy.  --has been Clip'd for TTS at [facility].  10. BLE ischemia s/p Left TMA and R-BKA: NWB BLE and continue to use Shrinkers  11. Acute blood loss anemia: Improved and stable. Continue Aranesp 200 mcg/wk. Had 2 units PRBC on 5/8  -H/H most recently  7.1, transfuse if needed in HD  - have spoken about causes, rx above 12.  Afib: Monitor HR TID--continue metoprolol every 8 hours. Rate controlled             --no AC due to low CHADVASC score.  13. Metabolic encephalopathy/delirium: Has resolved.   14. Diarrhea: appears improved --dc fiber -continue prn's 15. Gout: appears improved  -On allopurinol '100mg'$   -Prednisone weaned off  LOS: 7 days A FACE TO FACE EVALUATION WAS PERFORMED  Meredith Staggers 04/10/2022, 10:40 AM

## 2022-04-10 NOTE — Progress Notes (Signed)
Speech Language Pathology Daily Session Note  Patient Details  Name: Jeffrey Ayala MRN: [id number] Date of Birth: 12/07/1960  Today's Date: 04/10/2022 SLP Individual Time: 1119-1208 SLP Individual Time Calculation (min): 49 min  Short Term Goals: Week 1: SLP Short Term Goal 1 (Week 1): STGs=LTGs due to ELOS  Skilled Therapeutic Interventions: Skilled ST treatment focused on cognitive goals. Pt received semi-reclined in bed on arrival, agreeable to transfer to wheelchair for session. Pt performed slideboard transfer to wheelchair with min-to-mod A. SLP facilitated working memory and sustained attention within a mildly distracting environment to recall sequence of words using memory task on BITS. Pt recalled up to 8 words and pictures for 4 minute duration with 80% accuracy and min A verbal cues to implement internal memory strategies (repetition, association); reported cognitive fatigue ~2.5 minutes into task. Pt recalled up to 6 words (text only, no pictures) for additional 4 minute duration with 66% accuracy with min A verbal cues; reported cognitive fatigue ~3 minutes into task.   SLP and pt completing medication management task to increase awareness of current medication regime. Prior to education and discussion on current medications, pt was only aware of 1 medication he has been receiving (Tylenol for pain). SLP providing sup A verbal cues fading to mod I to recall and locate medication on individualized medication chart which contained the following information: name of medication, dose, instructions, and purpose for taking. SLP encouraged patient to utilize medication chart as external aid to increase awareness of current medications. Patient was left in wheelchair with alarm activated and immediate needs within reach at end of session. Continue per current plan of care.      Pain Pain Assessment Pain Scale: 0-10 Pain Score: 0-No pain  Therapy/Group: Individual Therapy  Patty Sermons 04/10/2022, 12:54 PM

## 2022-04-10 NOTE — Progress Notes (Signed)
Occupational Therapy Weekly Progress Note  Patient Details  Name: Jeffrey Ayala MRN: [id number] Date of Birth: 1961/07/24  Beginning of progress report period: Apr 03, 2022 End of progress report period: Apr 10, 2022  Today's Date: 04/10/2022 OT Individual Time: 0800-0930 OT Individual Time Calculation (min): 90 min    Patient has met 4 of 4 short term goals.  Jeffrey Ayala has made great progress in CIR during his first week. He has progressed from max +2 at eval for slideboard transfers to CGA overall. His pain has improved considerably and his incisions are healing well. He will have a custom w/c eval today and family edu is scheduled for Monday.   Patient continues to demonstrate the following deficits: muscle weakness, decreased problem solving and delayed processing, and decreased sitting balance, decreased postural control, and decreased balance strategies and therefore will continue to benefit from skilled OT intervention to enhance overall performance with BADL.  Patient progressing toward long term goals..  Continue plan of care.  OT Short Term Goals Week 1:  OT Short Term Goal 1 (Week 1): Pt will compelte SB to w/c with 1 caregiver in prep for toileting OT Short Term Goal 1 - Progress (Week 1): Met OT Short Term Goal 2 (Week 1): Pt will don pants at bed level with MINA OT Short Term Goal 2 - Progress (Week 1): Met OT Short Term Goal 3 (Week 1): Pt will lateral lean in B directions with MIN A in prep for toilet/LB dressign tasks OT Short Term Goal 3 - Progress (Week 1): Met OT Short Term Goal 4 (Week 1): Pt will sit EOB/EOM dyanmically with supervision for 10 min and no LOB to demo imrpoved trunk control OT Short Term Goal 4 - Progress (Week 1): Met Week 2:  OT Short Term Goal 1 (Week 2): STG= LTG d/t ELOS  Skilled Therapeutic Interventions/Progress Updates:    Pt received supine with no c/o pain, agreeable to OT session. Pt completed bed mobility to EOB with (S), heavy use of  bedrail. He required placement of the slideboard, poor carryover of technique. He completed slideboard transfer to the w/c with CGA. He reported increased pain in his scrotum with transfer. Extensive education/discussion re friction/shear with slideboard vs beasy board and safety/ease with home transfers. Determined beasy board not safe option for toilet transfers and therefore more practical to use regular SB at home. Pt propelled w/c 25 ft before reporting fatigue and requiring mod A to be taken to the therapy gym.  Custom w/c evaluation completed with [facility] Deberah Pelton for ultralightweight w/c to increase community and home mobility- education provided throughout re pressure relief exercises to complete and coordination of w/c cushion to assist with skin integrity, functions of ultralightweight w/c, community/car management of w/c parts, and general care/upkeep of the chair.  Following the evaluation pt transferred to the mat with CGA using the slideboard. Pt completes 2x10-15 dumbbell therex for BUE shoulder strengthening required for BADLs/functional transfers as follows with demo cuing and 6 # dumbbell. He completed Shoulder press, shoulder flexion and Elbow flex/ext. He returned to his room and to supine. Skin inspection of his scrotum d/t discussion of possible scrotal sling to assist with pain during transfer. Very little to no swelling present so no current indication for sling. He was left supine with all needs, bed alarm set and wife present.    Therapy Documentation Precautions:  Precautions Precautions: Fall, Other (comment) Precaution Comments: Contact Required Braces or Orthoses: Other Brace Other Brace: limb  guard Restrictions Weight Bearing Restrictions: Yes RLE Weight Bearing: Non weight bearing LLE Weight Bearing: Non weight bearing Other Position/Activity Restrictions: bilat LEs  Therapy/Group: Individual Therapy  Curtis Sites 04/10/2022, 6:27 AM

## 2022-04-10 NOTE — Progress Notes (Signed)
Physical Therapy Session Note  Patient Details  Name: Jeffrey Ayala MRN: [id number] Date of Birth: 08-01-1961  Today's Date: 04/10/2022 PT Individual Time: 1530-1630 PT Individual Time Calculation (min): 60 min   Short Term Goals: Week 1:  PT Short Term Goal 1 (Week 1): supine to/from sit w/min assist PT Short Term Goal 2 (Week 1): sliding board transfer wc to/from mat w/mod assist of 2 PT Short Term Goal 3 (Week 1): Pt will tolerate 2 hr oob in wc and direct care for pressure relief as needed. PT Short Term Goal 4 (Week 1): Pt will propel standard wc 60f w/supervision.  Skilled Therapeutic Interventions/Progress Updates:     Pt received seated at edge of bed. No complaint of pain, though mentions that mobility tends to aggravate scrotal pain. PT provides rest breaks and friction-reducing mechanisms to address pain. Pt performs slide board transfer to the R with with modA. PT places towel over slideboard to reduce friction. Pt self propels WC to gym with bilateral upper extremities, x150'. PT provides cues for positioning of WC to safely setup transfer to mat. Slideboard transfer to mat table with minA and cues for body mechanics and sequencing. Pt performs 3x10 triceps extensions, gripping yoga blocks on either side and performing "dip" on mat table. PT provides verbal and tactile cues for body mechanics, including scapular depression and elbow extension. PT adjusts pt's WC, as pt's R hand brake was no longer engaging with wheel. Pt performs sit to supine to prone with cues for sequencing. Pt cued to initially position self on elbows for serratus anterior strengthening and light stretch of hip flexors. Pt progresses to 2x10 "push-ups" with hip remaining on mat. During rest break, PT provides soft tissue stretch of bilateral hip flexors. Prone to sitting with minA. [facility] and cues for sequencing. Pt self propels WC back to room. MinA for transfer to bed  with slideboard. Left with alarm intact and all needs within reach.  Therapy Documentation Precautions:  Precautions Precautions: Fall, Other (comment) Precaution Comments: Contact Required Braces or Orthoses: Other Brace Other Brace: limb guard Restrictions Weight Bearing Restrictions: Yes RLE Weight Bearing: Non weight bearing LLE Weight Bearing: Non weight bearing Other Position/Activity Restrictions: bilat LEs   Therapy/Group: Individual Therapy  WBreck Coons PT, DPT 04/10/2022, 5:01 PM

## 2022-04-10 NOTE — Progress Notes (Signed)
[facility] NEPHROLOGY PROGRESS NOTE  Assessment/ Plan:  # Acute kidney Injury: ATN from severe septic shock in the setting of foot wounds. He has been on dialysis dependent since 02/26/2022 (initially started with CRRT and transition to IHD with first tx 4/12).  Vein mapping done.  They would really like to get him through rehab stay before doing an AVF - HD per TTS schedule. We have clipped as AKI for now and watching for recovery, though may be end stage given prolonged dependence on dialysis.  He has an outpatient HD unit (as an AKI) at [facility] on TTS schedule -Status post HD yesterday with 3 L ultrafiltration, tolerated well.  Plan for regular HD tomorrow.  # Scrotal edema/wound: Status post surgical debridement by urology on 4/24 and on 5/6 underwent I&D of scrotum with wound closure and drain placement.    # Leg wound status post left TMA and right below-knee amputation on 03/15/2022: Septic shock resolved.    # Anemia - acute blood loss: s/p PRBC's multiple units. CT abd was negative for acute process.  Iron level acceptable and currently on Aranesp 200 mcg.  Monitor hemoglobin.  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  Monitor calcium, phosphorus.  Subjective: Seen and examined in inpatient rehab.  No new event.  He did not like dialysis in the night.  Denies nausea, vomiting, chest pain, shortness of breath.  His wife was present at the bedside. Objective Vital signs in last 24 hours: Vitals:   04/10/22 0038 04/10/22 0124 04/10/22 0418 04/10/22 0500  BP:  133/69 123/85   Pulse:  (!) 101 89   Resp:  18 18   Temp:  98.5 F (36.9 C) 98.4 F (36.9 C)   TempSrc:      SpO2:  98% 99%   Weight: 97 kg   97.6 kg  Height:       Weight change: 2.8 kg  Intake/Output Summary (Last 24 hours) at 04/10/2022 1023 Last data filed at 04/10/2022 0038 Gross per 24 hour  Intake --  Output 3000 ml  Net -3000 ml        Labs: Basic Metabolic Panel: Recent Labs  Lab 04/04/22 1439  04/06/22 2301  NA 140 139  K 3.4* 4.0  CL 102 105  CO2 26 21*  GLUCOSE 124* 134*  BUN 42* 62*  CREATININE 5.51* 8.15*  CALCIUM 8.4* 8.2*  PHOS 4.1 6.7*    Liver Function Tests: Recent Labs  Lab 04/04/22 1439 04/06/22 2301  ALBUMIN 2.6* 2.6*    No results for input(s): LIPASE, AMYLASE in the last 168 hours. No results for input(s): AMMONIA in the last 168 hours. CBC: Recent Labs  Lab 04/04/22 1441 04/06/22 2301  WBC 8.5 7.5  HGB 7.6* 7.1*  HCT 22.6* 22.5*  MCV 91.5 93.4  PLT 231 240    Cardiac Enzymes: No results for input(s): CKTOTAL, CKMB, CKMBINDEX, TROPONINI in the last 168 hours. CBG: Recent Labs  Lab 04/03/22 1131 04/07/22 0626  GLUCAP 116* 105*     Iron Studies: No results for input(s): IRON, TIBC, TRANSFERRIN, FERRITIN in the last 72 hours. Studies/Results: No results found.  Medications: Infusions:    Scheduled Medications:  acetaminophen  650 mg Oral Q6H   allopurinol  100 mg Oral Daily   vitamin C  1,000 mg Oral Daily   aspirin  81 mg Oral Daily   Chlorhexidine Gluconate Cloth  6 each Topical BID   darbepoetin (ARANESP) injection - DIALYSIS  200 mcg Intravenous Q  Tue-HD   folic acid  1 mg Oral Daily   guaiFENesin  400 mg Oral Q8H   lanthanum  1,000 mg Oral TID WC   leptospermum manuka honey  1 application. Topical Daily   melatonin  5 mg Oral QHS   metoprolol tartrate  37.5 mg Oral TID   multivitamin  1 tablet Oral QHS   pantoprazole  40 mg Oral Daily   QUEtiapine  25 mg Oral QHS    have reviewed scheduled and prn medications.  Physical Exam: General: Not in distress, comfortable. Heart:RRR, s1s2 nl Lungs: Clear b/l, no crackle Abdomen:soft, Non-tender, non-distended Extremities:No edema, left TMA and right BKA. Dialysis Access: Right IJ TDC.  Jeffrey Ayala Jeffrey Ayala 04/10/2022,10:23 AM  LOS: 7 days

## 2022-04-11 LAB — CBC
HCT: 22.6 % — ABNORMAL LOW (ref 39.0–52.0)
Hemoglobin: 7.4 g/dL — ABNORMAL LOW (ref 13.0–17.0)
MCH: 30.6 pg (ref 26.0–34.0)
MCHC: 32.7 g/dL (ref 30.0–36.0)
MCV: 93.4 fL (ref 80.0–100.0)
Platelets: 240 10*3/uL (ref 150–400)
RBC: 2.42 MIL/uL — ABNORMAL LOW (ref 4.22–5.81)
RDW: 18.4 % — ABNORMAL HIGH (ref 11.5–15.5)
WBC: 7.7 10*3/uL (ref 4.0–10.5)
nRBC: 0.3 % — ABNORMAL HIGH (ref 0.0–0.2)

## 2022-04-11 MED ORDER — HEPARIN SODIUM (PORCINE) 1000 UNIT/ML DIALYSIS: 1000.0000 [IU] | INTRAMUSCULAR | Status: DC | PRN

## 2022-04-11 MED ORDER — SODIUM CHLORIDE 0.9 % IV SOLN: 100.0000 mL | INTRAVENOUS | Status: DC | PRN

## 2022-04-11 MED ORDER — ALTEPLASE 2 MG IJ SOLR: 2.0000 mg | Freq: Once | INTRAMUSCULAR | Status: DC | PRN

## 2022-04-11 MED ORDER — LIDOCAINE HCL (PF) 1 % IJ SOLN: 5.0000 mL | INTRAMUSCULAR | Status: DC | PRN

## 2022-04-11 MED ORDER — PENTAFLUOROPROP-TETRAFLUOROETH EX AERO: 1.0000 "application " | INHALATION_SPRAY | CUTANEOUS | Status: DC | PRN

## 2022-04-11 MED ORDER — LIDOCAINE-PRILOCAINE 2.5-2.5 % EX CREA: 1.0000 "application " | TOPICAL_CREAM | CUTANEOUS | Status: DC | PRN

## 2022-04-11 MED ORDER — HEPARIN SODIUM (PORCINE) 1000 UNIT/ML DIALYSIS: 20.0000 [IU]/kg | INTRAMUSCULAR | Status: DC | PRN

## 2022-04-11 MED ORDER — HEPARIN SODIUM (PORCINE) 1000 UNIT/ML IJ SOLN
INTRAMUSCULAR | Status: AC
  Filled 2022-04-11: qty 4

## 2022-04-11 NOTE — Progress Notes (Signed)
PROGRESS NOTE   Subjective/Complaints: Patient had a reasonably good night.  Wife at bedside to provide bath.  Pt says that he is fatigued from dialysis and it is hard to get used to it.  Pain is controlled.  Asking about new socks for feet since current ones are too big.  ROS: Patient denies fever, rash, sore throat, blurred vision, dizziness, nausea, vomiting, diarrhea, cough, shortness of breath or chest pain,  back/neck pain, headache, or mood change.      Objective:   No results found.  Recent Labs    04/11/22 0511  WBC 7.7  HGB 7.4*  HCT 22.6*  PLT 240    No results for input(s): NA, K, CL, CO2, GLUCOSE, BUN, CREATININE, CALCIUM in the last 72 hours.   Intake/Output Summary (Last 24 hours) at 04/11/2022 1522 Last data filed at 04/11/2022 1242 Gross per 24 hour  Intake --  Output 400 ml  Net -400 ml        Physical Exam: Vital Signs Blood pressure 125/75, pulse (!) 107, temperature 98.2 F (36.8 C), resp. rate 20, height '5\' 10"'$  (1.778 m), weight 95.5 kg, SpO2 100 %.    Physical Exam   Constitutional: No distress . Vital signs reviewed. HEENT: NCAT, EOMI, oral membranes moist Neck: supple, soft Cardiovascular: RRR without murmur. No JVD    Respiratory/Chest: CTA Bilaterally without wheezes or rales. Normal effort    GI/Abdomen: BS +, non-tender, non-distended Ext: no clubbing, cyanosis, or edema Psych: pleasant and cooperative  Skin: right elbow wound dressed foam dressing. Was packed with iodoform dressing this am by nurse.  Both amputation sites cdi.  Staples present on Rt BKA and sutures present on Lt TMA.  Scrotal dressing present with sanguinous drainage (moderate amount), scrotal swelling improved.  Foam present on right forearm. Neuro: alert and oriented x 4. STM deficits present, but has reasonable insight and awareness. Musc:  Wearing shrinkers bilaterally R 4th digit wrapped with Coban.      Assessment/Plan: 1. Functional deficits which require 3+ hours per day of interdisciplinary therapy in a comprehensive inpatient rehab setting. Physiatrist is providing close team supervision and 24 hour management of active medical problems listed below. Physiatrist and rehab team continue to assess barriers to discharge/monitor patient progress toward functional and medical goals  Care Tool:  Bathing    Body parts bathed by patient: Right arm, Left arm, Chest, Abdomen, Right upper leg, Left upper leg, Face   Body parts bathed by helper: Front perineal area, Buttocks, Left lower leg Body parts n/a: Right lower leg   Bathing assist Assist Level: Moderate Assistance - Patient 50 - 74%     Upper Body Dressing/Undressing Upper body dressing   What is the patient wearing?: Pull over shirt    Upper body assist Assist Level: Minimal Assistance - Patient > 75%    Lower Body Dressing/Undressing Lower body dressing      What is the patient wearing?: Pants     Lower body assist Assist for lower body dressing: Total Assistance - Patient < 25%     Toileting Toileting    Toileting assist Assist for toileting: 2 Helpers     Transfers  Chair/bed transfer  Transfers assist     Chair/bed transfer assist level: Supervision/Verbal cueing     Locomotion Ambulation   Ambulation assist   Ambulation activity did not occur: Safety/medical concerns          Walk 10 feet activity   Assist  Walk 10 feet activity did not occur: Safety/medical concerns        Walk 50 feet activity   Assist Walk 50 feet with 2 turns activity did not occur: Safety/medical concerns         Walk 150 feet activity   Assist Walk 150 feet activity did not occur: Safety/medical concerns         Walk 10 feet on uneven surface  activity   Assist Walk 10 feet on uneven surfaces activity did not occur: Safety/medical concerns         Wheelchair     Assist Is the  patient using a wheelchair?: Yes Type of Wheelchair: Manual Wheelchair activity did not occur: Safety/medical concerns (unable to tolerate sitting upright in standard wc, TIS used)  Wheelchair assist level: Supervision/Verbal cueing Max wheelchair distance: >150 ft    Wheelchair 50 feet with 2 turns activity    Assist        Assist Level: Supervision/Verbal cueing   Wheelchair 150 feet activity     Assist      Assist Level: Supervision/Verbal cueing   Blood pressure 125/75, pulse (!) 107, temperature 98.2 F (36.8 C), resp. rate 20, height '5\' 10"'$  (1.778 m), weight 95.5 kg, SpO2 100 %.  Medical Problem List and Plan: 1. Functional deficits secondary to debility, right BKA             -patient may shower             -ELOS/Goals:  5/24 S             -Continue CIR therapies including PT, OT, and SLP, w/c eval per PT 2.  Antithrombotics: -DVT/anticoagulation:  Mechanical: Sequential compression devices, below knee Left lower extremity             -antiplatelet therapy: ASA  3. Pain Management: Oxycodone prn. -remains well controlled 5/20 No issues with pain. 4. Mood/sleep: pt with some definite coping issues. Doesn't remember a lot of his acute stay either  -team providing ego support.   -would like neuropsych to see  -  melatonin '5mg'$  qhs, continue seroquel '25mg'$ , has trazodone prn   -sleep is better             -antipsychotic agents: seroquel 5. Neuropsych: This patient is somewhat capable of making decisions on his own behalf. 6. Skin/Wound Care: Air mattress for pressure relief measures.              --continue Vit C, Zinc and protein supplements to help promote healing of multiple wounds.             -added juven for protein calorie malnutrition.   -shrinkers for bilateral LE's--may need smaller one soon for bka  -elbow wound needs santyl and packing-followed up with nurse today 5/19  -staples have been in almost 4 weeks---remove Monday 5/22             5/20  Plan to remove staples on 5/22 7. Fluids/Electrolytes/Nutrition: Strict I/O. Monitor weights daily.              --renal diet with 1200 cc/FR. Has been refusing nephro-->will try juven.  8. Scrotal wound infection/gangrene s/p I  and D: Completes Augmentin and Zyberxa today             --sling scrotum for support.   -5/18 continue with dressing, keep clean and dry 9. Acute kidney injury/ HD dependent--now ESRD: Still being monitored for chance of recovery --need strict I/O. Vein mapping completed 05/12 -- HD MWF at the end of the day to help with tolerance of therapy.  --has been Clip'd for TTS at [facility].  10. BLE ischemia s/p Left TMA and R-BKA: NWB BLE and continue to use Shrinkers 11. Acute blood loss anemia: Improved and stable. Continue Aranesp 200 mcg/wk. Had 2 units PRBC on 5/8  -H/H most recently  7.1, transfuse if needed in HD  - have spoken about causes, rx above            -5/20 repeat CBC today, Hgb 7.4, continue to trend with qMonday labs.     Latest Ref Rng & Units 04/11/2022    5:11 AM 04/06/2022   11:01 PM 04/04/2022    2:41 PM  CBC  WBC 4.0 - 10.5 K/uL 7.7   7.5   8.5    Hemoglobin 13.0 - 17.0 g/dL 7.4   7.1   7.6    Hematocrit 39.0 - 52.0 % 22.6   22.5   22.6    Platelets 150 - 400 K/uL 240   240   231       12.  Afib: Monitor HR TID--continue metoprolol every 8 hours. Rate controlled             --no AC due to low CHADVASC score.  13. Metabolic encephalopathy/delirium: Has resolved.   14. Diarrhea: appears improved --dc fiber -continue prn's 5/20 BM's normal, LBM today, formed. 15. Gout: appears improved  -On allopurinol '100mg'$   -Prednisone weaned off            -5/20 Gout improved, reports use of Coban on 4th Rt digit helps.  LOS: 8 days A FACE TO FACE EVALUATION WAS PERFORMED  Luetta Nutting 04/11/2022, 3:22 PM

## 2022-04-11 NOTE — Progress Notes (Signed)
[facility] NEPHROLOGY PROGRESS NOTE  Assessment/ Plan:  # Acute kidney Injury: ATN from severe septic shock in the setting of foot wounds. He has been on dialysis dependent since 02/26/2022 (initially started with CRRT and transition to IHD with first tx 4/12).  Vein mapping done.  They would really like to get him through rehab stay before doing an AVF - HD per TTS schedule. We have clipped as AKI for now and watching for recovery, though may be end stage given prolonged dependence on dialysis.  He has an outpatient HD unit (as an AKI) at [facility] on TTS schedule -Plan for regular dialysis today.  Check lab during HD.  Patient said that his urine output going off therefore we will do a strict ins and out to monitor urine output.  Discussed with the nurse.  # Scrotal edema/wound: Status post surgical debridement by urology on 4/24 and on 5/6 underwent I&D of scrotum with wound closure and drain placement.    # Leg wound status post left TMA and right below-knee amputation on 03/15/2022: Septic shock resolved.    # Anemia - acute blood loss: s/p PRBC's multiple units. CT abd was negative for acute process.  Iron level acceptable and currently on Aranesp 200 mcg.  Monitor hemoglobin.  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  Monitor calcium, phosphorus.  Subjective: Seen and examined in inpatient rehab.  No new event.  Denies nausea, vomiting, chest pain, shortness of breath. Objective Vital signs in last 24 hours: Vitals:   04/10/22 1248 04/10/22 2015 04/11/22 0306 04/11/22 0539  BP: 108/64 116/63 129/72   Pulse: (!) 105 (!) 106 (!) 108   Resp: '16 18 18   '$ Temp: 98.3 F (36.8 C) 99.3 F (37.4 C) 97.7 F (36.5 C)   TempSrc:  Oral Oral   SpO2: 100% 99% 100%   Weight:    97.6 kg  Height:       Weight change: -2.8 kg No intake or output data in the 24 hours ending 04/11/22 0930      Labs: Basic Metabolic Panel: Recent Labs  Lab 04/04/22 1439 04/06/22 2301  NA 140 139  K  3.4* 4.0  CL 102 105  CO2 26 21*  GLUCOSE 124* 134*  BUN 42* 62*  CREATININE 5.51* 8.15*  CALCIUM 8.4* 8.2*  PHOS 4.1 6.7*    Liver Function Tests: Recent Labs  Lab 04/04/22 1439 04/06/22 2301  ALBUMIN 2.6* 2.6*    No results for input(s): LIPASE, AMYLASE in the last 168 hours. No results for input(s): AMMONIA in the last 168 hours. CBC: Recent Labs  Lab 04/04/22 1441 04/06/22 2301 04/11/22 0511  WBC 8.5 7.5 7.7  HGB 7.6* 7.1* 7.4*  HCT 22.6* 22.5* 22.6*  MCV 91.5 93.4 93.4  PLT 231 240 240    Cardiac Enzymes: No results for input(s): CKTOTAL, CKMB, CKMBINDEX, TROPONINI in the last 168 hours. CBG: Recent Labs  Lab 04/07/22 0626  GLUCAP 105*     Iron Studies: No results for input(s): IRON, TIBC, TRANSFERRIN, FERRITIN in the last 72 hours. Studies/Results: No results found.  Medications: Infusions:    Scheduled Medications:  acetaminophen  650 mg Oral Q6H   allopurinol  100 mg Oral Daily   vitamin C  1,000 mg Oral Daily   aspirin  81 mg Oral Daily   Chlorhexidine Gluconate Cloth  6 each Topical BID   Chlorhexidine Gluconate Cloth  6 each Topical Q0600   darbepoetin (ARANESP) injection - DIALYSIS  200 mcg  Intravenous Q Tue-HD   folic acid  1 mg Oral Daily   guaiFENesin  400 mg Oral Q8H   lanthanum  1,000 mg Oral TID WC   leptospermum manuka honey  1 application. Topical Daily   melatonin  5 mg Oral QHS   metoprolol tartrate  37.5 mg Oral TID   multivitamin  1 tablet Oral QHS   pantoprazole  40 mg Oral Daily   QUEtiapine  25 mg Oral QHS    have reviewed scheduled and prn medications.  Physical Exam: General: Able to lie flat, not in distress. Heart:RRR, s1s2 nl Lungs: Clear b/l, no crackle Abdomen:soft, Non-tender, non-distended Extremities:No edema, left TMA and right BKA. Dialysis Access: Right IJ TDC.  Asako Saliba Prasad Jen Eppinger 04/11/2022,9:30 AM  LOS: 8 days

## 2022-04-11 NOTE — Plan of Care (Signed)
  Problem: Consults Goal: RH LIMB LOSS PATIENT EDUCATION Description: Description: See Patient Education module for eduction specifics. Outcome: Progressing Goal: Skin Care Protocol Initiated - if Braden Score 18 or less Description: If consults are not indicated, leave blank or document N/A Outcome: Progressing   Problem: RH BOWEL ELIMINATION Goal: RH STG MANAGE BOWEL WITH ASSISTANCE Description: STG Manage Bowel with min Assistance. Outcome: Progressing Goal: RH STG MANAGE BOWEL W/MEDICATION W/ASSISTANCE Description: STG Manage Bowel with Medication with min Assistance. Outcome: Progressing   Problem: RH SKIN INTEGRITY Goal: RH STG SKIN FREE OF INFECTION/BREAKDOWN Description: Skin to remain free from additional breakdown and infection with min assist while on rehab. Outcome: Progressing Goal: RH STG MAINTAIN SKIN INTEGRITY WITH ASSISTANCE Description: STG Maintain Skin Integrity With min Assistance. Outcome: Progressing Goal: RH STG ABLE TO PERFORM INCISION/WOUND CARE W/ASSISTANCE Description: STG Able To Perform Incision/Wound Care With min Assistance. Outcome: Progressing   Problem: RH SAFETY Goal: RH STG ADHERE TO SAFETY PRECAUTIONS W/ASSISTANCE/DEVICE Description: STG Adhere to Safety Precautions With Cues and Reminders. Outcome: Progressing Goal: RH STG DECREASED RISK OF FALL WITH ASSISTANCE Description: STG Decreased Risk of Fall With min Assistance. Outcome: Progressing   Problem: RH PAIN MANAGEMENT Goal: RH STG PAIN MANAGED AT OR BELOW PT'S PAIN GOAL Description: < 3 on a 0-10 pain scale. Outcome: Progressing   Problem: RH KNOWLEDGE DEFICIT LIMB LOSS Goal: RH STG INCREASE KNOWLEDGE OF SELF CARE AFTER LIMB LOSS Description: Patient will demonstrate knowledge of self-care management, medication/pain management, skin/wound care, weight bearing precautions, and HD awareness with educational materials and handouts provided by staff independently at discharge. Outcome:  Progressing

## 2022-04-11 NOTE — Procedures (Signed)
Pt BP upon arrival to the hemodialysis unit was 136/76, he had no complaints nor distress.  Permcath dressing is intact and occlusive with both limbs easily flushing and drawing blood.  Pt rested for the entire treatment and upon end of treatment his BP was 141/80 with a net UF removal of 3L .  Pt tolerated treatment well and was in stable condition upon transfer back to his room.

## 2022-04-11 NOTE — Progress Notes (Signed)
Occupational Therapy Session Note  Patient Details  Name: Jeffrey Ayala MRN: [id number] Date of Birth: November 15, 1961  Today's Date: 04/11/2022 OT Individual Time: 1104-1200 OT Individual Time Calculation (min): 56 min    Short Term Goals: Week 1:  OT Short Term Goal 1 (Week 1): Pt will compelte SB to w/c with 1 caregiver in prep for toileting OT Short Term Goal 1 - Progress (Week 1): Met OT Short Term Goal 2 (Week 1): Pt will don pants at bed level with MINA OT Short Term Goal 2 - Progress (Week 1): Met OT Short Term Goal 3 (Week 1): Pt will lateral lean in B directions with MIN A in prep for toilet/LB dressign tasks OT Short Term Goal 3 - Progress (Week 1): Met OT Short Term Goal 4 (Week 1): Pt will sit EOB/EOM dyanmically with supervision for 10 min and no LOB to demo imrpoved trunk control OT Short Term Goal 4 - Progress (Week 1): Met Week 2:  OT Short Term Goal 1 (Week 2): STG= LTG d/t ELOS  Skilled Therapeutic Interventions/Progress Updates:  Patient met lying supine in bed in agreement with OT treatment session. 3/10 pain reported at rest and with activity in scrotum. Wife present at bedside throughout. Session with focus on home set-up and patient/family education with regards to bathroom set-up. Supine to EOB with supervision A. Wife assisted with placement of slideboard for transfer to wc. Completed transfer with CGA. Able to self-propel wc to ADL apartment in prep for shower transfer. Education on bathroom set-up in prep for bathing once cleared. Completed tub/shower transfer with use of slideboard and Min A overall. Patient with desire to go outdoors. Total A for wc transport to and from outdoor area near hospital atrium for time management. Once back on unit, session concluded with patient seated in wc with call bell within reach, chair alarm activated and all needs met.   Therapy Documentation Precautions:  Precautions Precautions: Fall, Other (comment) Precaution Comments:  Contact Required Braces or Orthoses: Other Brace Other Brace: limb guard Restrictions Weight Bearing Restrictions: Yes RLE Weight Bearing: Non weight bearing LLE Weight Bearing: Non weight bearing Other Position/Activity Restrictions: bilat LEs General:    Therapy/Group: Individual Therapy  [name] 04/11/2022, 10:44 AM

## 2022-04-12 NOTE — Plan of Care (Signed)
  Problem: Consults Goal: RH LIMB LOSS PATIENT EDUCATION Description: Description: See Patient Education module for eduction specifics. Outcome: Progressing Goal: Skin Care Protocol Initiated - if Braden Score 18 or less Description: If consults are not indicated, leave blank or document N/A Outcome: Progressing   Problem: RH BOWEL ELIMINATION Goal: RH STG MANAGE BOWEL WITH ASSISTANCE Description: STG Manage Bowel with min Assistance. Outcome: Progressing Goal: RH STG MANAGE BOWEL W/MEDICATION W/ASSISTANCE Description: STG Manage Bowel with Medication with min Assistance. Outcome: Progressing   Problem: RH SKIN INTEGRITY Goal: RH STG SKIN FREE OF INFECTION/BREAKDOWN Description: Skin to remain free from additional breakdown and infection with min assist while on rehab. Outcome: Progressing Goal: RH STG MAINTAIN SKIN INTEGRITY WITH ASSISTANCE Description: STG Maintain Skin Integrity With min Assistance. Outcome: Progressing Goal: RH STG ABLE TO PERFORM INCISION/WOUND CARE W/ASSISTANCE Description: STG Able To Perform Incision/Wound Care With min Assistance. Outcome: Progressing   Problem: RH SAFETY Goal: RH STG ADHERE TO SAFETY PRECAUTIONS W/ASSISTANCE/DEVICE Description: STG Adhere to Safety Precautions With Cues and Reminders. Outcome: Progressing Goal: RH STG DECREASED RISK OF FALL WITH ASSISTANCE Description: STG Decreased Risk of Fall With min Assistance. Outcome: Progressing   Problem: RH PAIN MANAGEMENT Goal: RH STG PAIN MANAGED AT OR BELOW PT'S PAIN GOAL Description: < 3 on a 0-10 pain scale. Outcome: Progressing   Problem: RH KNOWLEDGE DEFICIT LIMB LOSS Goal: RH STG INCREASE KNOWLEDGE OF SELF CARE AFTER LIMB LOSS Description: Patient will demonstrate knowledge of self-care management, medication/pain management, skin/wound care, weight bearing precautions, and HD awareness with educational materials and handouts provided by staff independently at discharge. Outcome:  Progressing

## 2022-04-12 NOTE — Progress Notes (Signed)
PROGRESS NOTE   Subjective/Complaints: Patient had a fairly good night, son at bedside.  Reports that fatigue from dialysis is still an issue.   Sleep was not great due to being in the hospital.   ROS: Patient denies fever, rash, sore throat, blurred vision, dizziness, nausea, vomiting, diarrhea, cough, shortness of breath or chest pain,  back/neck pain, headache, or mood change.      Objective:   No results found.  Recent Labs    04/11/22 0511  WBC 7.7  HGB 7.4*  HCT 22.6*  PLT 240    No results for input(s): NA, K, CL, CO2, GLUCOSE, BUN, CREATININE, CALCIUM in the last 72 hours.   Intake/Output Summary (Last 24 hours) at 04/12/2022 1111 Last data filed at 04/11/2022 2035 Gross per 24 hour  Intake 297 ml  Output 3400 ml  Net -3103 ml        Physical Exam: Vital Signs Blood pressure 135/87, pulse (!) 105, temperature 98.7 F (37.1 C), temperature source Oral, resp. rate 18, height '5\' 10"'$  (1.778 m), weight 70 kg, SpO2 98 %.    Physical Exam   Constitutional: No distress . Vital signs reviewed. HEENT: NCAT, EOMI, oral membranes moist Neck: supple, soft Cardiovascular: RRR without murmur. No JVD    Respiratory/Chest: CTA Bilaterally without wheezes or rales. Normal effort    GI/Abdomen: BS +, non-tender, non-distended Ext: no clubbing, cyanosis, or edema Psych: pleasant and cooperative  Skin: right elbow wound dressed foam dressing. Was packed with iodoform dressing this am by nurse.  Both amputation sites cdi.  Staples present on Rt BKA and sutures present on Lt TMA.  Scrotal dressing present with sanguinous drainage (moderate amount), scrotal swelling improved.  Foam present on right forearm. Neuro: alert and oriented x 4. STM deficits present, but has reasonable insight and awareness. Musc:  Wearing shrinkers bilaterally R 4th digit wrapped with Coban.   No change in physical exam since  04/11/22   Assessment/Plan: 1. Functional deficits which require 3+ hours per day of interdisciplinary therapy in a comprehensive inpatient rehab setting. Physiatrist is providing close team supervision and 24 hour management of active medical problems listed below. Physiatrist and rehab team continue to assess barriers to discharge/monitor patient progress toward functional and medical goals  Care Tool:  Bathing    Body parts bathed by patient: Right arm, Left arm, Chest, Abdomen, Right upper leg, Left upper leg, Face   Body parts bathed by helper: Front perineal area, Buttocks, Left lower leg Body parts n/a: Right lower leg   Bathing assist Assist Level: Moderate Assistance - Patient 50 - 74%     Upper Body Dressing/Undressing Upper body dressing   What is the patient wearing?: Pull over shirt    Upper body assist Assist Level: Minimal Assistance - Patient > 75%    Lower Body Dressing/Undressing Lower body dressing      What is the patient wearing?: Pants     Lower body assist Assist for lower body dressing: Total Assistance - Patient < 25%     Toileting Toileting    Toileting assist Assist for toileting: 2 Helpers     Transfers Chair/bed transfer  Transfers assist  Chair/bed transfer assist level: Supervision/Verbal cueing     Locomotion Ambulation   Ambulation assist   Ambulation activity did not occur: Safety/medical concerns          Walk 10 feet activity   Assist  Walk 10 feet activity did not occur: Safety/medical concerns        Walk 50 feet activity   Assist Walk 50 feet with 2 turns activity did not occur: Safety/medical concerns         Walk 150 feet activity   Assist Walk 150 feet activity did not occur: Safety/medical concerns         Walk 10 feet on uneven surface  activity   Assist Walk 10 feet on uneven surfaces activity did not occur: Safety/medical concerns         Wheelchair     Assist Is  the patient using a wheelchair?: Yes Type of Wheelchair: Manual Wheelchair activity did not occur: Safety/medical concerns (unable to tolerate sitting upright in standard wc, TIS used)  Wheelchair assist level: Supervision/Verbal cueing Max wheelchair distance: >150 ft    Wheelchair 50 feet with 2 turns activity    Assist        Assist Level: Supervision/Verbal cueing   Wheelchair 150 feet activity     Assist      Assist Level: Supervision/Verbal cueing   Blood pressure 135/87, pulse (!) 105, temperature 98.7 F (37.1 C), temperature source Oral, resp. rate 18, height '5\' 10"'$  (1.778 m), weight 70 kg, SpO2 98 %.  Medical Problem List and Plan: 1. Functional deficits secondary to debility, right BKA             -patient may shower             -ELOS/Goals:  5/24 S             -Continue CIR therapies including PT, OT, and SLP, w/c eval per PT 2.  Antithrombotics: -DVT/anticoagulation:  Mechanical: Sequential compression devices, below knee Left lower extremity             -antiplatelet therapy: ASA  3. Pain Management: Oxycodone prn. -remains well controlled 5/20 No issues with pain. 4. Mood/sleep: pt with some definite coping issues. Doesn't remember a lot of his acute stay either  -team providing ego support.   -would like neuropsych to see  -  melatonin '5mg'$  qhs, continue seroquel '25mg'$ , has trazodone prn   -sleep is better             -antipsychotic agents: seroquel 5. Neuropsych: This patient is somewhat capable of making decisions on his own behalf. 6. Skin/Wound Care: Air mattress for pressure relief measures.              --continue Vit C, Zinc and protein supplements to help promote healing of multiple wounds.             -added juven for protein calorie malnutrition.   -shrinkers for bilateral LE's--may need smaller one soon for bka  -elbow wound needs santyl and packing-followed up with nurse today 5/19  -staples have been in almost 4 weeks---remove Monday  5/22             5/21 Plan to remove staples on 5/22 7. Fluids/Electrolytes/Nutrition: Strict I/O. Monitor weights daily.              --renal diet with 1200 cc/FR. Has been refusing nephro-->will try juven.  8. Scrotal wound infection/gangrene s/p I and D: Completes Augmentin and Zyberxa  today             --sling scrotum for support.   -5/18 continue with dressing, keep clean and dry 9. Acute kidney injury/ HD dependent--now ESRD: Still being monitored for chance of recovery --need strict I/O. Vein mapping completed 05/12 -- HD MWF at the end of the day to help with tolerance of therapy.  --has been Clip'd for TTS at [facility].  10. BLE ischemia s/p Left TMA and R-BKA: NWB BLE and continue to use Shrinkers 11. Acute blood loss anemia: Improved and stable. Continue Aranesp 200 mcg/wk. Had 2 units PRBC on 5/8  -H/H most recently  7.1, transfuse if needed in HD  - have spoken about causes, rx above            -5/20 repeat CBC today, Hgb 7.4, continue to trend with qMonday labs.             5/21 Pending qMonday labs.     Latest Ref Rng & Units 04/11/2022    5:11 AM 04/06/2022   11:01 PM 04/04/2022    2:41 PM  CBC  WBC 4.0 - 10.5 K/uL 7.7   7.5   8.5    Hemoglobin 13.0 - 17.0 g/dL 7.4   7.1   7.6    Hematocrit 39.0 - 52.0 % 22.6   22.5   22.6    Platelets 150 - 400 K/uL 240   240   231       12.  Afib: Monitor HR TID--continue metoprolol every 8 hours. Rate controlled             --no AC due to low CHADVASC score.  13. Metabolic encephalopathy/delirium: Has resolved.   14. Diarrhea: appears improved --dc fiber -continue prn's 5/20 BM's normal, LBM today, formed. 5/21 LBM today, no issues with diarrhea. 15. Gout: appears improved  -On allopurinol '100mg'$   -Prednisone weaned off            -5/20 Gout improved, reports use of Coban on 4th Rt digit helps.             5/21 Continue 100 allopurinol daily  LOS: 9 days A FACE TO FACE EVALUATION WAS PERFORMED  Luetta Nutting 04/12/2022,  11:11 AM

## 2022-04-12 NOTE — Progress Notes (Signed)
Physical Therapy Session Note  Patient Details  Name: Jeffrey Ayala MRN: [id number] Date of Birth: 18-Jun-1961  Today's Date: 04/12/2022 PT Individual Time: 0850-0945 PT Individual Time Calculation (min): 55 min   Short Term Goals: Week 1:  PT Short Term Goal 1 (Week 1): supine to/from sit w/min assist PT Short Term Goal 2 (Week 1): sliding board transfer wc to/from mat w/mod assist of 2 PT Short Term Goal 3 (Week 1): Pt will tolerate 2 hr oob in wc and direct care for pressure relief as needed. PT Short Term Goal 4 (Week 1): Pt will propel standard wc 55f w/supervision.  Skilled Therapeutic Interventions/Progress Updates:     Patient in bed with his son at bedside upon PT arrival. Patient alert and agreeable to PT session. Patient denied pain during session.  Patient reports intermittent burning pain with transfers using the slide board, also reports increased fatigue following dialysis and with poor sleep quality in the hospital bed. Focused on A/P transfers to compare pain levels versus slide board transfers. Provided education on sleep hygiene strategies and positioning in bed to improve sleep quality.  Therapeutic Activity: Bed Mobility: Patient performed supine to sit with CGA to promote forward momentum to come to sitting. Provided verbal cues for setting bottom elbow and bringing shoulders forward to come up to sitting. Transfers: Patient performed A/P transfer bed>w/c with CGA-min A for clearing hips Provided cues for head-hips relationship and reciprocal scooting throughout. Patient reported no pain during transfer. Does feel increased effort with this transfer compared to slide board.  Wheelchair Mobility:  Patient propelled wheelchair up/down a 10 ft ramp with mod-min A x3 to simulate home entry. Provided verbal cues for technique and safe assist. Patient doffed/donned leg rests with max progressing to supervision for cues.   Therapeutic Exercise: Patient performed the  following exercises with verbal and tactile cues for proper technique. -w/c propulsion >150 ft x2 for upper extremity strength/endurance -w/c push-ups 2x5 -lateral leans 2x5  Patient in w/c with his son in the room at end of session with breaks locked and all needs within reach.   Therapy Documentation Precautions:  Precautions Precautions: Fall, Other (comment) Precaution Comments: Contact Required Braces or Orthoses: Other Brace Other Brace: limb guard Restrictions Weight Bearing Restrictions: Yes RLE Weight Bearing: Non weight bearing LLE Weight Bearing: Non weight bearing Other Position/Activity Restrictions: bilat LEs   Therapy/Group: Individual Therapy  Benicia Bergevin L Gregrey Bloyd PT, DPT  04/12/2022, 4:10 PM

## 2022-04-12 NOTE — Progress Notes (Signed)
Speech Language Pathology Daily Session Note  Patient Details  Name: AYIDEN MILLIMAN MRN: [id number] Date of Birth: [birth year]  Today's Date: 04/12/2022 SLP Individual Time: 1115-1150 SLP Individual Time Calculation (min): 35 min  Short Term Goals: Week 1: SLP Short Term Goal 1 (Week 1): STGs=LTGs due to ELOS  Skilled Therapeutic Interventions:Skilled slp intervention focused on cognition. Pt stated he plans for his wife to sort his medications at home. Educated him on importance of familiarizing self with medications to increase independence once home. Calendar organization task completed with min-mod A for attention to details, error awareness and and reading his own handwriting to answer problem solving questions using the events he wrote. He participated well in skilled slp intervention. Cont with therapy per plan of care.      Pain Pain Assessment Pain Scale: Faces Pain Score: 0-No pain Faces Pain Scale: No hurt  Therapy/Group: Individual Therapy  Darrol Poke Keviana Guida 04/12/2022, 12:25 PM

## 2022-04-12 NOTE — Progress Notes (Signed)
[facility] PROGRESS NOTE  Assessment/ Plan:  # Acute kidney Injury: ATN from severe septic shock in the setting of foot wounds. He has been on dialysis dependent since 02/26/2022 (initially started with CRRT and transition to IHD with first tx 4/12).  Vein mapping done.  They would really like to get him through rehab stay before doing an AVF - HD per TTS schedule. We have clipped as AKI for now and watching for recovery, though may be end stage given prolonged dependence on dialysis.  He has an outpatient HD unit (as an AKI) at [facility] on TTS schedule -Status post HD on 5/20 with UF around 3 L, tolerated well.  Plan for next HD on 5/23.  # Scrotal edema/wound: Status post surgical debridement by urology on 4/24 and on 5/6 underwent I&D of scrotum with wound closure and drain placement.    # Leg wound status post left TMA and right below-knee amputation on 03/15/2022: Septic shock resolved.    # Anemia - acute blood loss: s/p PRBC's multiple units. CT abd was negative for acute process.  Iron level acceptable and currently on Aranesp 200 mcg.  Monitor hemoglobin.  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  Monitor calcium, phosphorus.  Subjective: Seen and examined in inpatient rehab.  No new event.  Denies nausea, vomiting, chest pain, shortness of breath.  His son was present at the bedside.  Objective Vital signs in last 24 hours: Vitals:   04/11/22 1754 04/11/22 2028 04/12/22 0339 04/12/22 0344  BP: 135/88 127/77 135/87   Pulse: (!) 107 (!) 108 (!) 105   Resp: '18 18 18   '$ Temp: 98.6 F (37 C) 98.8 F (37.1 C) 98.7 F (37.1 C)   TempSrc: Oral Oral Oral   SpO2: 99% 99% 98%   Weight:    70 kg  Height:       Weight change: -2.1 kg  Intake/Output Summary (Last 24 hours) at 04/12/2022 0930 Last data filed at 04/11/2022 2035 Gross per 24 hour  Intake 297 ml  Output 3400 ml  Net -3103 ml        Labs: Basic Metabolic Panel: Recent Labs  Lab 04/06/22 2301  NA  139  K 4.0  CL 105  CO2 21*  GLUCOSE 134*  BUN 62*  CREATININE 8.15*  CALCIUM 8.2*  PHOS 6.7*    Liver Function Tests: Recent Labs  Lab 04/06/22 2301  ALBUMIN 2.6*    No results for input(s): LIPASE, AMYLASE in the last 168 hours. No results for input(s): AMMONIA in the last 168 hours. CBC: Recent Labs  Lab 04/06/22 2301 04/11/22 0511  WBC 7.5 7.7  HGB 7.1* 7.4*  HCT 22.5* 22.6*  MCV 93.4 93.4  PLT 240 240    Cardiac Enzymes: No results for input(s): CKTOTAL, CKMB, CKMBINDEX, TROPONINI in the last 168 hours. CBG: Recent Labs  Lab 04/07/22 0626  GLUCAP 105*     Iron Studies: No results for input(s): IRON, TIBC, TRANSFERRIN, FERRITIN in the last 72 hours. Studies/Results: No results found.  Medications: Infusions:    Scheduled Medications:  acetaminophen  650 mg Oral Q6H   allopurinol  100 mg Oral Daily   vitamin C  1,000 mg Oral Daily   aspirin  81 mg Oral Daily   Chlorhexidine Gluconate Cloth  6 each Topical BID   Chlorhexidine Gluconate Cloth  6 each Topical Q0600   darbepoetin (ARANESP) injection - DIALYSIS  200 mcg Intravenous Q Tue-HD   folic acid  1  mg Oral Daily   guaiFENesin  400 mg Oral Q8H   lanthanum  1,000 mg Oral TID WC   leptospermum manuka honey  1 application. Topical Daily   melatonin  5 mg Oral QHS   metoprolol tartrate  37.5 mg Oral TID   multivitamin  1 tablet Oral QHS   pantoprazole  40 mg Oral Daily   QUEtiapine  25 mg Oral QHS    have reviewed scheduled and prn medications.  Physical Exam: General: Able to lie flat comfortable, not in distress. Heart:RRR, s1s2 nl Lungs: Clear b/l, no crackle Abdomen:soft, Non-tender, non-distended Extremities:No edema, left TMA and right BKA. Dialysis Access: Right IJ TDC.  Jeffrey Ayala Jeffrey Ayala 04/12/2022,9:30 AM  LOS: 9 days

## 2022-04-13 LAB — BASIC METABOLIC PANEL
Anion gap: 12 (ref 5–15)
BUN: 41 mg/dL — ABNORMAL HIGH (ref 8–23)
CO2: 23 mmol/L (ref 22–32)
Calcium: 9.2 mg/dL (ref 8.9–10.3)
Chloride: 103 mmol/L (ref 98–111)
Creatinine, Ser: 6.4 mg/dL — ABNORMAL HIGH (ref 0.61–1.24)
GFR, Estimated: 9 mL/min — ABNORMAL LOW (ref >60)
Glucose, Bld: 108 mg/dL — ABNORMAL HIGH (ref 70–99)
Potassium: 4.1 mmol/L (ref 3.5–5.1)
Sodium: 138 mmol/L (ref 135–145)

## 2022-04-13 LAB — CBC
HCT: 24.7 % — ABNORMAL LOW (ref 39.0–52.0)
Hemoglobin: 7.9 g/dL — ABNORMAL LOW (ref 13.0–17.0)
MCH: 29.7 pg (ref 26.0–34.0)
MCHC: 32 g/dL (ref 30.0–36.0)
MCV: 92.9 fL (ref 80.0–100.0)
Platelets: 273 10*3/uL (ref 150–400)
RBC: 2.66 MIL/uL — ABNORMAL LOW (ref 4.22–5.81)
RDW: 18.7 % — ABNORMAL HIGH (ref 11.5–15.5)
WBC: 8.6 10*3/uL (ref 4.0–10.5)
nRBC: 0 % (ref 0.0–0.2)

## 2022-04-13 MED ORDER — CHLORHEXIDINE GLUCONATE CLOTH 2 % EX PADS: 6.0000 | MEDICATED_PAD | Freq: Every day | CUTANEOUS | Status: DC

## 2022-04-13 NOTE — Consult Note (Signed)
Neuropsychological Consultation   Patient:   Jeffrey Ayala   DOB:   [birth year]  MR Number:  [id number]  Location:  [facility] [address] [facility] [place] [id number] [place] [address] Dept: [place]: [phone]           Date of Service:   04/13/2022  Start Time:   8 AM End Time:   9 AM  Provider/Observer:  Ilean Skill, Psy.D.       Clinical Neuropsychologist       Billing Code/Service: [phone]  Chief Complaint:    Jeffrey Ayala is a 61 year old male with a history of eosinophilic esophagitis, PUD, migraines.  Patient was admitted to [facility] emergency department on 02/22/2022 due to severe illness with sepsis.  He was intubated with brief cardiac arrest with resuscitation.  Patient ended up developing dry gangrenous changes of scrotum as well as undergoing left transmetatarsal amputation and right BKA and follow-up OR on 4/23 for debridement of scrotum.  Patient continued to have some confusion and agitation due to delirium postextubation.  Patient has denied phantom limb pain initially.  Patient also set up for ongoing dialysis.  Patient was referred to CIR due to functional decline.  Reason for Service:  Patient was referred for neuropsychological consultation due to coping and adjustment issues with right BKA and left transmetatarsal amputation as well as significant infection with I&D of scrotum area as well as other areas of infection.  Below is the HPI for the current admission.  HPI: Jeffrey Ayala. Ayala is a 61 year old male with history of eosinopilic esophagitis, PUD, migraines who was originally admitted on 02/22/22 with fever-102.9, hypoxia, N/V/D and A fib with HR in 120-160 due to sepsis. He was intubated in ED and had brief cardiac arrest with resuscitation with ACLS protocol and elevated trops felt to be due to demand ischemia from sepsis.  He was started on broad spectrum antibiotics and fluid  boluses but required pressures due to persistent hypotension. Work up revealed cortical fluid filled colon seen w/diarrhea, marked scrotal thickening without abscess concerning for cellulitis, R>L hydrocele and cortical thinning of right kidney.  Dr. Alinda Money consulted and recommended monitoring and UA done revealing pyuria/hematuria as scrotum felt likely not to be the source of infection. He had worsening of respiratory status with agitation requiring intubation on 04/03 as well as multiple pressors. . Follow up CT without abscess but he developed ecchymosis with significant edema and weeping of scrotum, anasarca and mottled/cool bilateral feet/toes.   He received methylene blue (to correct vasoplegia), started on stress dose steroids, IV heparin-->bivalirudin and antibiotics changed to Zyvox and Zosyn.  Dr. Hollie Salk consulted for acute renal failure with decrease in UOP as well as hypocalcemia.  He went on to develop oliguria and was started on CRRT on 04/06.   VVS consulted for input on BLE ischemia and recommended monitoring for demarcation due to dry gangrene.  Cardiology following for input and no plans for ischemia work, possible DCCV if A fib persistet  and ultimate plans to transition to Eliquis as able. He was weaned off pressors and extubated without difficulty 04/10.  He started developing scrotal pain  with areas of sloughing and rise in WBC on 04/11. Scrotal edema was improving but with dry gangrenous changes of scrotum  with plans to coordinate and surgery with [name] once cleared by cardiology. He underwent Left transmetatarsal amputation and right BKA on 04/23 by [name].  He was taken back to OR on 04/23 for debridement of scrotum and post op with hypotension and agitation requiring intubation till 04/25. ABLA treated with on unit PRBC. 2 D echo showed EF 60-65% without wall abnormality and cardiology felt that Jeffrey Ayala, Jeffrey Ayala not needed due to anemia, rate controlled A fib and low CHADVASc score. He tolerated  extubation but continued to have confusion/agitation due to delirium. Was on tube feeds for nutritional support but cortak pulled out and he was cleared for D3 diet with aspiration precautions.    Dr. Baxter Flattery consulted on 04/29 as scrotal cultures grew  MDRO pseudomonas, enterococcus faecalis and bacteroides ovatus.  He required repeat I & D with closure of wound 05/06  and completes 2 week course of antibiotics--> Zerbaxa and Augmentin today.  He required 2 additional units 05/07 and 05/08. CT abdomen pelvis done and was negative for source of bleeding.   Wound VAC removed and shrinkers placed on BLE--is NWB BLE.  He is felt to have endstage renal failure and has been [facility] to [facility]  TTS schedule.  BUE vein mapping completed with plans for AVF in the future (patient wanted to hold off for now). Light bleeding from scrotal wound treated with dry dressing and Dakin's discontinued?  WOC consulted for input on right elbow and RFA non healing wounds and recommended Aquacel with daily dressing changes. He continues to be limited by weakness, groin pain (working on increasing sitting time to >20 minutes)  and requires multimodal cues with increased time for processing/sequencing. CIR recommended due to functional decline. Denies phantom limb pain.  Current Status:  Patient was awake and alert sitting on the edge of his bed with wife present in room as I entered the room.  He was aware and oriented with good cognition but clearly frustrated.  He acknowledged difficulty coping with realization of the extent of his surgical interventions and both he and his wife describe significant ongoing discomfort with his scrotum area.  The patient describes his biggest frustration and concern is about his need for ongoing dialysis.  He acknowledges frustration and coping difficulties and concern for how much his life will be changed going forward.   Behavioral Observation: Jeffrey Ayala  presents as a 61 y.o.-year-old  Right Caucasian Male who appeared his stated age. his dress was Appropriate and he was Well Groomed and his manners were Appropriate to the situation.  his participation was indicative of Appropriate and Redirectable behaviors.  There were physical disabilities noted.  he displayed an appropriate level of cooperation and motivation.     Interactions:    Active Appropriate  Attention:   abnormal and attention span appeared shorter than expected for age  Memory:   within normal limits; recent and remote memory intact  Visuo-spatial:  not examined  Speech (Volume):  low  Speech:   normal; normal  Thought Process:  Coherent and Relevant  Though Content:  WNL; not suicidal and not homicidal  Orientation:   person, place, time/date, and situation  Judgment:   Fair  Planning:   Fair  Affect:    Blunted and Flat  Mood:    Dysphoric  Insight:   Good  Intelligence:   normal   Medical History:   Past Medical History:  Diagnosis Date   Concussion    Eosinophilic esophagitis    EGD/bx by Dr. Havery Moros   Gastric ulcer    History of kidney stones    Intermittent dysphagia    Migraine  Patient Active Problem List   Diagnosis Date Noted   Arthralgia of right lower leg    Acute blood loss anemia    Unilateral complete BKA, right, initial encounter ([place]) 04/03/2022   S/P BKA (below knee amputation), right ([place]) 04/03/2022   Cirrhosis ([place]) 03/31/2022   Fournier's gangrene in male    Scrotal infection 03/25/2022   Anemia 03/25/2022   Diarrhea 03/25/2022   Shock liver 03/25/2022   Acute respiratory failure with hypoxia ([place]) 03/25/2022   Gout 03/25/2022   Acute pain 03/25/2022   Demand ischemia ([place]) 03/25/2022   Thrombocytopenia ([place]) 03/25/2022   Obesity, Class III, BMI 40-49.9 (morbid obesity) ([place]) 03/25/2022   Cardiac arrest ([place]) 03/25/2022   Hypotension    Respiratory insufficiency    Cellulitis    Malnutrition of moderate degree 03/12/2022   Altered  mental status    Gangrene of right foot ([name])    Gangrene of left foot ([place])    Severe protein-calorie malnutrition ([place]) 03/10/2022   Encounter for central line placement    Pressure injury of skin 02/23/2022   Septic shock ([place]) 02/22/2022   AKI (acute kidney injury) ([place])    Generalized abdominal pain    Lactic acidosis    Atrial fibrillation with RVR ([name])    Hypokalemia    Hypomagnesemia    S/P TKR (total knee replacement), bilateral 11/04/2018   Bilateral primary osteoarthritis of knee    Rupture of medial head of gastrocnemius, left, sequela 01/14/2017   Kidney stones 06/22/2016   HYPERTENSION 08/01/2010   DYSTHYMIC DISORDER 07/08/2010   PALPITATIONS 07/08/2010        Psychiatric History:  No prior psychiatric history noted  Family Med/Psych History:  Family History  Problem Relation Age of Onset   COPD Mother    Sarcoidosis Brother    Cancer Brother    Stomach cancer Neg Hx    Pancreatic cancer Neg Hx    Esophageal cancer Neg Hx    Colon cancer Neg Hx    Rectal cancer Neg Hx     Risk of Suicide/Violence: low patient denied any suicidal or homicidal ideation.  Impression/DX:  Jeffrey Ayala is a 61 year old male with a history of eosinophilic esophagitis, PUD, migraines.  Patient was admitted to [facility] emergency department on 02/22/2022 due to severe illness with sepsis.  He was intubated with brief cardiac arrest with resuscitation.  Patient ended up developing dry gangrenous changes of scrotum as well as undergoing left transmetatarsal amputation and right BKA and follow-up OR on 4/23 for debridement of scrotum.  Patient continued to have some confusion and agitation due to delirium postextubation.  Patient has denied phantom limb pain initially.  Patient also set up for ongoing dialysis.  Patient was referred to CIR due to functional decline.  Patient was awake and alert sitting on the edge of his bed with wife present in room as I entered the room.  He was  aware and oriented with good cognition but clearly frustrated.  He acknowledged difficulty coping with realization of the extent of his surgical interventions and both he and his wife describe significant ongoing discomfort with his scrotum area.  The patient describes his biggest frustration and concern is about his need for ongoing dialysis.  He acknowledges frustration and coping difficulties and concern for how much his life will be changed going forward.  Disposition/Plan:  Today we worked on coping and adjustment issues and worked around identifying levels of depression and being aware of how it  will impact his recovery time frame and need for identification by his care team.  Diagnosis:    Significant medical illness with recovery from severe infection and sepsis, right BKA and left transmetatarsal amputation.         Electronically Signed   _______________________ Ilean Skill, Psy.D. Clinical Neuropsychologist

## 2022-04-13 NOTE — Plan of Care (Signed)
  Problem: Consults Goal: RH LIMB LOSS PATIENT EDUCATION Description: Description: See Patient Education module for eduction specifics. Outcome: Progressing Goal: Skin Care Protocol Initiated - if Braden Score 18 or less Description: If consults are not indicated, leave blank or document N/A Outcome: Progressing   Problem: RH BOWEL ELIMINATION Goal: RH STG MANAGE BOWEL WITH ASSISTANCE Description: STG Manage Bowel with min Assistance. Outcome: Progressing Goal: RH STG MANAGE BOWEL W/MEDICATION W/ASSISTANCE Description: STG Manage Bowel with Medication with min Assistance. Outcome: Progressing   Problem: RH SKIN INTEGRITY Goal: RH STG SKIN FREE OF INFECTION/BREAKDOWN Description: Skin to remain free from additional breakdown and infection with min assist while on rehab. Outcome: Progressing Goal: RH STG MAINTAIN SKIN INTEGRITY WITH ASSISTANCE Description: STG Maintain Skin Integrity With min Assistance. Outcome: Progressing Goal: RH STG ABLE TO PERFORM INCISION/WOUND CARE W/ASSISTANCE Description: STG Able To Perform Incision/Wound Care With min Assistance. Outcome: Progressing   Problem: RH SAFETY Goal: RH STG ADHERE TO SAFETY PRECAUTIONS W/ASSISTANCE/DEVICE Description: STG Adhere to Safety Precautions With Cues and Reminders. Outcome: Progressing Goal: RH STG DECREASED RISK OF FALL WITH ASSISTANCE Description: STG Decreased Risk of Fall With min Assistance. Outcome: Progressing   Problem: RH PAIN MANAGEMENT Goal: RH STG PAIN MANAGED AT OR BELOW PT'S PAIN GOAL Description: < 3 on a 0-10 pain scale. Outcome: Progressing   Problem: RH KNOWLEDGE DEFICIT LIMB LOSS Goal: RH STG INCREASE KNOWLEDGE OF SELF CARE AFTER LIMB LOSS Description: Patient will demonstrate knowledge of self-care management, medication/pain management, skin/wound care, weight bearing precautions, and HD awareness with educational materials and handouts provided by staff independently at discharge. Outcome:  Progressing

## 2022-04-13 NOTE — Discharge Summary (Signed)
Physician Discharge Summary  Patient ID: [name] MRN: [id number] DOB/AGE: [birth year] 61 y.o.  Admit date: 04/03/2022 Discharge date: 04/15/2022  Discharge Diagnoses:  Principal Problem:   S/P BKA (below knee amputation), right ([place]) Active Problems:   Unilateral complete BKA, right, initial encounter ([place])   Arthralgia of right lower leg   Acute blood loss anemia Scrotal wound infection/gangrene/status post I&D AKI/HD dependent Bilateral lower ischemia status post left TMA and right BKA A-fib Metabolic encephalopathy Gout  Discharged Condition: Stable  Significant Diagnostic Studies: CT RENAL STONE STUDY  Result Date: 03/31/2022 CLINICAL DATA:  Anemia. EXAM: CT ABDOMEN AND PELVIS WITHOUT CONTRAST TECHNIQUE: Multidetector CT imaging of the abdomen and pelvis was performed following the standard protocol without IV contrast. RADIATION DOSE REDUCTION: This exam was performed according to the departmental dose-optimization program which includes automated exposure control, adjustment of the mA and/or kV according to patient size and/or use of iterative reconstruction technique. COMPARISON:  CT abdomen pelvis dated 02/28/2022. FINDINGS: Evaluation of this exam is limited in the absence of intravenous contrast. Lower chest: The visualized lung bases are clear. No intra-abdominal free air.  Small perihepatic ascites. Hepatobiliary: Slight irregularity of the liver contour suspicious for early changes of cirrhosis. Clinical correlation is recommended. No intrahepatic biliary dilatation. The gallbladder is unremarkable. Pancreas: Unremarkable. No pancreatic ductal dilatation or surrounding inflammatory changes. Spleen: Splenomegaly measuring 20 cm in length. Adrenals/Urinary Tract: The adrenal glands are unremarkable. Multiple nonobstructing bilateral renal calculi measure up to 1 cm. There is no hydronephrosis on either side. The visualized ureters and urinary bladder appear unremarkable.  Stomach/Bowel: A rectal tube is in place. There are scattered sigmoid diverticula without active inflammatory changes. There is no bowel obstruction or active inflammation. The appendix is normal. Vascular/Lymphatic: Mild aortoiliac atherosclerotic disease. The IVC is unremarkable. No portal venous gas. There is no adenopathy. Reproductive: The prostate and seminal vesicles are grossly unremarkable. No pelvic mass. Other: Mild diffuse subcutaneous edema.  No fluid collection. Musculoskeletal: Degenerative changes of the spine. No acute osseous pathology. IMPRESSION: 1. No acute intra-abdominal or pelvic pathology. 2. Multiple nonobstructing bilateral renal calculi. No hydronephrosis. 3. Colonic diverticulosis. No bowel obstruction. Normal appendix. 4. Cirrhosis with splenomegaly and small perihepatic ascites. 5. Aortic Atherosclerosis (ICD10-I70.0). Electronically Signed   By: Anner Crete M.D.   On: 03/31/2022 03:38   VAS [place] UPPER EXT VEIN MAPPING (PRE-OP AVF)  Result Date: 04/03/2022 UPPER EXTREMITY VEIN MAPPING Patient Name:  Jeffrey Ayala  Date of Exam:   04/03/2022 Medical Rec #: [id number]         Accession #:    [id number] Date of Birth: 1961/08/26         Patient Gender: M Patient Age:   61 years Exam Location:  [facility] Procedure:      VAS [place] UPPER EXT VEIN MAPPING (PRE-OP AVF) Referring Phys: [name] --------------------------------------------------------------------------------  Indications: Pre-access. Limitations: Patient somnolence, patient movement, IV's, bandages Comparison Study: No prior studies. Performing Technologist: Oliver Hum RVT  Examination Guidelines: A complete evaluation includes B-mode imaging, spectral Doppler, color Doppler, and power Doppler as needed of all accessible portions of each vessel. Bilateral testing is considered an integral part of a complete examination. Limited examinations for reoccurring indications may be performed as noted.  +-----------------+-------------+----------+----------------------+ Right Cephalic   Diameter (cm)Depth (cm)       Findings        +-----------------+-------------+----------+----------------------+ Shoulder             0.35  1.50                          +-----------------+-------------+----------+----------------------+ Prox upper arm       0.37        0.94                          +-----------------+-------------+----------+----------------------+ Mid upper arm        0.31        0.35                          +-----------------+-------------+----------+----------------------+ Dist upper arm       0.31        0.39                          +-----------------+-------------+----------+----------------------+ Antecubital fossa    0.43        0.36         branching        +-----------------+-------------+----------+----------------------+ Prox forearm         0.35        0.38          Thrombus        +-----------------+-------------+----------+----------------------+ Mid forearm          0.36        0.50   Thrombus and branching +-----------------+-------------+----------+----------------------+ Dist forearm         0.26        0.28         branching        +-----------------+-------------+----------+----------------------+ +-----------------+-------------+----------+---------+ Right Basilic    Diameter (cm)Depth (cm)Findings  +-----------------+-------------+----------+---------+ Shoulder             0.59        1.60             +-----------------+-------------+----------+---------+ Prox upper arm       0.43        1.50             +-----------------+-------------+----------+---------+ Mid upper arm        0.37        1.00             +-----------------+-------------+----------+---------+ Dist upper arm       0.41        0.72   branching +-----------------+-------------+----------+---------+ Antecubital fossa    0.29        0.30              +-----------------+-------------+----------+---------+ Prox forearm         0.22        0.32   branching +-----------------+-------------+----------+---------+ Mid forearm          0.20        0.26   branching +-----------------+-------------+----------+---------+ Distal forearm       0.11        0.21             +-----------------+-------------+----------+---------+ +-----------------+-------------+----------+--------------+ Left Cephalic    Diameter (cm)Depth (cm)   Findings    +-----------------+-------------+----------+--------------+ Shoulder             0.33        1.90                  +-----------------+-------------+----------+--------------+ Prox upper arm       0.34        0.78                  +-----------------+-------------+----------+--------------+  Mid upper arm        0.32        0.46                  +-----------------+-------------+----------+--------------+ Dist upper arm       0.44        0.41                  +-----------------+-------------+----------+--------------+ Antecubital fossa    0.33        0.31     branching    +-----------------+-------------+----------+--------------+ Prox forearm         0.44        0.24     branching    +-----------------+-------------+----------+--------------+ Mid forearm          0.29        0.32                  +-----------------+-------------+----------+--------------+ Dist forearm                            not visualized +-----------------+-------------+----------+--------------+ +-----------------+-------------+----------+---------+ Left Basilic     Diameter (cm)Depth (cm)Findings  +-----------------+-------------+----------+---------+ Shoulder             0.80        1.40             +-----------------+-------------+----------+---------+ Mid upper arm        0.63        1.40             +-----------------+-------------+----------+---------+ Dist upper arm       0.61         0.45   branching +-----------------+-------------+----------+---------+ Antecubital fossa    0.50        0.53   branching +-----------------+-------------+----------+---------+ Prox forearm         0.34        0.54   Thrombus  +-----------------+-------------+----------+---------+ Mid forearm          0.30        0.40   Thrombus  +-----------------+-------------+----------+---------+ Distal forearm       0.17        0.21             +-----------------+-------------+----------+---------+ *See table(s) above for measurements and observations.  Diagnosing physician: Monica Martinez MD Electronically signed by Monica Martinez MD on 04/03/2022 at 1:29:31 PM.    Final     Labs:  Basic Metabolic Panel: Recent Labs  Lab 04/13/22 0611  NA 138  K 4.1  CL 103  CO2 23  GLUCOSE 108*  BUN 41*  CREATININE 6.40*  CALCIUM 9.2    CBC: Recent Labs  Lab 04/11/22 0511 04/13/22 0611  WBC 7.7 8.6  HGB 7.4* 7.9*  HCT 22.6* 24.7*  MCV 93.4 92.9  PLT 240 273    CBG: No results for input(s): GLUCAP in the last 168 hours.  Family history.  Mother with COPD Brother with sarcoidosis.  Negative for stomach cancer pancreatic cancer or esophageal cancer  Brief HPI:   LISANDRO MEGGETT is a 61 y.o. right-handed male with history of eosinopilic esophagitis, PUD, migraines headaches who was originally admitted on 02/22/2022 with fever, hypoxia, nausea vomiting diarrhea and atrial fibrillation with heart rate in the 120-160s due to sepsis.  He was intubated in ED and had brief cardiac arrest with resuscitation with ACLS protocol and elevated troponins felt to be demand ischemia from sepsis.  He  was started on broad-spectrum antibiotics and fluid boluses but required pressors due to persistent hypotension.  Work-up revealed cortical fluid-filled colon seen with diarrhea, marked scrotal thickening without abscess concerning for cellulitis, right greater than left hydrocele and cortical thinning of  right kidney.  Dr. Alinda Money consulted recommended monitoring and UA done revealing pyuria/hematuria as scrotum felt likely not to be the source of infection.  He had worsening of respiratory status with agitation requiring intubation on 4/03 as well as multiple pressors.  Follow-up CT without abscess but did develop ecchymosis with significant edema and weeping of scrotum, anasarca and mottled/cool bilateral feet/toes.  He received methylene blue to correct vasoplegia, started on stress dose steroids, IV heparin-bivalirudin and antibiotics changed to Zyvox and Zosyn.  Dr. Hollie Salk consulted for acute renal failure with decrease in urine output as well as hypocalcemia.  He went on to develop oliguria and started on CRRT 4/06.  VVS consulted for input on bilateral lower extremity ischemia recommended monitoring for demarcation due to dry gangrene.  Cardiology follow-up for input no plans for ischemic work-up, possible DCCV if A-fib persistent and ultimate plans to transition to Eliquis if needed.  He was weaned off pressors extubated without difficulty 04/10.  He started developing scrotal pain with areas of sloughing and rising WBC on 04/11.  Scrotal edema was improving but with dry gangrenous changes of scrotum with plans to coordinate with surgery with [name] once cleared by cardiology.  He underwent left transmetatarsal amputation and right BKA 04/23 by [name].  He was taken back to the OR on 04/23 for debridement of scrotum and postoperative hypotension and agitation requiring intubation until 04/25.  Acute blood loss anemia treated 1 unit with packed red blood cells, echocardiogram with ejection fraction of 60 to 65% without wall abnormality cardiology felt that [facility] not needed due to anemia, rate control A-fib and low CHADVASc score.  He tolerated extubation but continued to have confusion/agitation due to delirium.  Was on tube feeds for nutritional support but cortrak pulled out and he was cleared for  mechanical soft diet.  Dr. Baxter Flattery consulted on 04/29 as scrotal cultures grew MDRO Pseudomonas, Enterococcus faecalis and Bacteroides ovatus.  He required repeat I&D with closure of wound 05/06 and completed 2-week course of antibiotic-Zerbaxa and Augmentin.  Required 2 additional units 05/07 and 05/08.  CT abdomen pelvis done was negative for source of bleeding.  Wound VAC removed and shrinker was placed on bilateral lower extremities nonweightbearing bilateral extremities.  He is felt to have end-stage renal failure had been clipped to [facility] Tuesday Thursday Saturday schedule.  Bilateral upper extremity vein mapping completed plans for aVF in the future.  Wound care consulted for input on right elbow and RFA nonhealing wounds and recommended aqua seal with daily dressing changes.  He continue to be limited by weakness and was admitted for a comprehensive rehab program.   Hospital Course: ADMIRAL MARCUCCI was admitted to rehab 04/03/2022 for inpatient therapies to consist of PT, ST and OT at least three hours five days a week. Past admission physiatrist, therapy team and rehab RN have worked together to provide customized collaborative inpatient rehab.  Pertaining to patient's right BKA left TMA follow-up orthopedic services nonweightbearing shrinkers as indicated.  He did remain on low-dose aspirin therapy.  Pain managed use of oxycodone as needed monitoring mental status.  Neuropsychology was involved with help with patient's coping.  Scrotal wound infection/gangrene/status post I&D completed Augmentin and Zyberxa.  He remained afebrile.  Sling support for scrotum.  AKI/HD dependent now end-stage renal disease follow-up renal services clip for [facility]TS at [facility].  Acute blood loss anemia Aranesp as indicated patient had multiple transfusions.  A-fib metoprolol as indicated cardiac rate controlled.  Metabolic encephalopathy/delirium much improved resolved.  Noted gout prednisone weaned off  allopurinol as indicated.   Blood pressures were monitored on TID basis and soft and monitored     Rehab course: During patient's stay in rehab weekly team conferences were held to monitor patient's progress, set goals and discuss barriers to discharge. At admission, patient required minimal assist supine to sit minimal assist sit to side-lying  Physical exam.  Blood pressure 110/60 pulse 80 temperature 98 respirations 18 oxygen saturation 92% room air Constitutional.  No acute distress HEENT Head.  Normocephalic and atraumatic Eyes.  Pupils round and reactive to light no discharge without nystagmus Neck.  Supple nontender no JVD without thyromegaly Cardiac regular rate rhythm my extra sounds or murmur heard Abdomen.  Soft nontender positive bowel sounds without rebound Respiratory effort normal no respiratory distress without wheeze Skin.  Comments.  Right ring finger with edema, mild erythema with peeling skin.  Well-healed old TKR incisions-left with small area of dehiscence proximal and right knee with dime size scab.  Left elbow with bogginess dressing in place Left transmetatarsal amputation site with sutures in place and adherent scab inferior aspect.  Right BKA healing well with staples in place  He/She  has had improvement in activity tolerance, balance, postural control as well as ability to compensate for deficits. He/She has had improvement in functional use RUE/LUE  and RLE/LLE as well as improvement in awareness.  Patient with great overall progress.  He was limited by deconditioning undergoing energy conservation techniques.  Currently performs bed mobility contact-guard supervision slide board with A/P transfers to supervision intermittent min assist with fatigue, and supervision modified independent for wheelchair propulsion and parts management.  Required cues due to decreased recall secondary to cognition.  Patient performed car transfer in his personal sedan outside of the  atrium entrance with his wife with set up and supervision using sliding board with max total assist for placement and for holding board steady during transfer.  Patient performed slide board transfer wheelchair to bed with his wife performing set up with assist supervision for transfers total assist for board placement.  OT educated on slide board placement with family.  Patient attempted PeriCare but needed spouse to finish.  He was able to lean side to side but needed OT assistance.  Full family teaching completed plan discharged to home       Disposition: Discharge to home    Diet: Renal 1200 mL fluid restriction  Special Instructions: No driving smoking or alcohol  Continue hemodialysis as directed  Wound care to elbow apply a dab of Medihoney to necrotic area of elbow wound and pack elbow wound with iodoform gauze (or small piece of gauze) slightly moistened with saline daily  Medications at discharge 1.  Tylenol as needed 2.  Zyloprim 100 mg p.o. daily 3.  Vitamin C 1000 mg p.o. daily 4.  Aspirin 81 mg p.o. daily 5.  Aranesp weekly with hemodialysis 6.  Folic acid 1 mg p.o. daily 7.  Fosrenol 1000 mg p.o. 3 times daily with meals 8.  Medihoney 1 application to right elbow wound daily 9.  Melatonin 5 mg p.o. nightly 10.  Lopressor 37.5 mg p.o. 3 times daily 11.  Rena-Vite 1 tablet nightly 12.  Oxycodone  5 mg p.o. every 4 hours as needed pain 13.  Protonix 40 mg p.o. daily 14.  Seroquel 25 mg p.o. nightly  30-35 minutes were spent completing discharge summary and discharge planning  Discharge Instructions     Ambulatory referral to Physical Medicine Rehab   Complete by: As directed    Moderate complexity follow-up 1 to 2 weeks right BKA      Allergies as of 04/15/2022       Reactions   Codeine Hives   Insomnia, jittery   Flomax [tamsulosin Hcl] Nausea Only   Nausea and fatigue        Medication List     STOP taking these medications     amoxicillin-clavulanate 500-125 MG tablet Commonly known as: AUGMENTIN   ceftolozane-tazobactam 150 mg in sodium chloride 0.9 % 100 mL   OSCAL 500 D-3 PO   OVER THE COUNTER MEDICATION       TAKE these medications    acetaminophen 650 MG CR tablet Commonly known as: TYLENOL Take 650 mg by mouth every 8 (eight) hours as needed for pain or fever.   allopurinol 100 MG tablet Commonly known as: Zyloprim Take 1 tablet (100 mg total) by mouth daily.   ascorbic acid 1000 MG tablet Commonly known as: VITAMIN C Take 1 tablet (1,000 mg total) by mouth daily.   aspirin 81 MG chewable tablet Chew 1 tablet (81 mg total) by mouth daily.   folic acid 1 MG tablet Commonly known as: FOLVITE Take 1 tablet (1 mg total) by mouth daily.   hydrOXYzine 10 MG/5ML syrup Commonly known as: ATARAX Take 12.5 mLs (25 mg total) by mouth 2 (two) times daily as needed for itching.   lanthanum 1000 MG chewable tablet Commonly known as: FOSRENOL Chew 1 tablet (1,000 mg total) by mouth 3 (three) times daily with meals.   leptospermum manuka honey Pste paste Apply 1 application. topically daily. To elbow wound, cover with damp to dry dressing. Change daily. What changed: additional instructions   melatonin 5 MG Tabs Take 1 tablet (5 mg total) by mouth at bedtime.   Metoprolol Tartrate 37.5 MG Tabs Take 37.5 mg by mouth 3 (three) times daily.   multivitamin Tabs tablet Take 1 tablet by mouth at bedtime.   oxyCODONE 5 MG immediate release tablet Commonly known as: Oxy IR/ROXICODONE Take 1 tablet (5 mg total) by mouth daily as needed for severe pain. What changed:  when to take this reasons to take this   pantoprazole 40 MG tablet Commonly known as: PROTONIX Take 1 tablet (40 mg total) by mouth daily. What changed: when to take this   QUEtiapine 25 MG tablet Commonly known as: SEROQUEL Take 1 tablet (25 mg total) by mouth at bedtime.   simethicone 80 MG chewable tablet Commonly  known as: MYLICON Chew 1 tablet (80 mg total) by mouth 4 (four) times daily as needed for flatulence.   traZODone 50 MG tablet Commonly known as: DESYREL Take 0.5-1 tablets (25-50 mg total) by mouth at bedtime as needed for sleep.        Follow-up Information     Meredith Staggers, MD Follow up.   Specialty: Physical Medicine and Rehabilitation Why: Office to call for appointment Contact information: [address] [place] [address] [phone]         [name], MD Follow up.   Specialty: Orthopedic Surgery Why: Call for appointment on ampuation Contact information: [address] [place] [address]  [phone]         Madelon Lips, MD Follow up.   Specialty: Nephrology Why: Call for appointment Contact information: [place] [address] [phone]         Raynelle Bring, MD Follow up.   Specialty: Urology Why: Call for appointment on scrotal wound Contact information: [place] [address] [phone]         Arnoldo Lenis, MD Follow up.   Specialty: Cardiology Why: Call for appointment follow up on cardiac issues Contact information: [address] [place] [address] [phone]         [facility] Go on 04/16/2022.   Why: Schedule is Tuesday, Thursday, Saturday with 10:20 chair time.  Please arrive at 9:45 on Thursday, May 25 to complete paperwork prior to treatment. Contact information: [address] [place] [address] [phone]                 Signed: Lavon Paganini [place] 04/15/2022, 5:24 AM

## 2022-04-13 NOTE — Progress Notes (Signed)
Occupational Therapy Session Note  Patient Details  Name: Jeffrey Ayala MRN: [id number] Date of Birth: [birth year]  Today's Date: 04/13/2022 OT Individual Time: 1100-1200 OT Individual Time Calculation (min): 60 min    Short Term Goals: Week 2:  OT Short Term Goal 1 (Week 2): STG= LTG d/t ELOS  Skilled Therapeutic Interventions/Progress Updates:    Pt greeted supine in bed with spouse present for family education. OT reviewed slideboard and AP transfer with pt and spouse. OT educated on slideboard placement, using dysom for non-slip surface on BSC. Pt's spouse assisted with board placement and steadying wc for bed>wc and wc>drop arm BSC. Educated on lateral leans for clothing management with wc placed on one side to allow for bigger lean. Pt voided bladder and had successful BM. Pt attempted peri-care but needed spouse to finish. Pt was able to lean side to side but needed OT assist to get pants up over hips. Practiced AP transfer back to bed with education provided to pt and spouse of position and skin integrity. Pt left semi-reclined in bed with spouse present and needs met.  Therapy Documentation Precautions:  Precautions Precautions: Fall, Other (comment) Precaution Comments: Contact Required Braces or Orthoses: Other Brace Other Brace: limb guard Restrictions Weight Bearing Restrictions: Yes RLE Weight Bearing: Non weight bearing LLE Weight Bearing: Non weight bearing Other Position/Activity Restrictions: bilat LEs Pain: Pain Assessment Pain Scale: 0-10 Pain Score: 0-No pain   Therapy/Group: Individual Therapy  Valma Cava 04/13/2022, 12:45 PM

## 2022-04-13 NOTE — Progress Notes (Signed)
[facility] NEPHROLOGY PROGRESS NOTE  Assessment/ Plan:  1)  Acute kidney Injury: ATN from severe septic shock in the setting of foot wounds. He has been on dialysis dependent since 02/26/2022 (initially started with CRRT and transition to IHD with first tx 4/12).  Vein mapping done.  They would really like to get him through rehab stay before doing an AVF - HD per TTS schedule. We have clipped as AKI for now and watching for recovery, though may be end stage given prolonged dependence on dialysis.  He has an outpatient HD unit (as an AKI) at [facility] on TTS schedule -Status post HD on 5/20 with UF around 3 L, tolerated well.  Plan for next HD on 5/23.  - Strict I&O's -> won't check 24hr clearance unless UOP is adequate (at minimum >746m/24hr period)  2) Scrotal edema/wound: Status post surgical debridement by urology on 4/24 and on 5/6 underwent I&D of scrotum with wound closure and drain placement.    3)  Leg wound status post left TMA and right below-knee amputation on 03/15/2022: Septic shock resolved.    4)  Anemia - acute blood loss: s/p PRBC's multiple units. CT abd was negative for acute process.  Iron level acceptable and currently on Aranesp 200 mcg.  Monitor hemoglobin.  5)  CKD-MBD: On Fosrenol.  Monitor calcium, phosphorus.  Subjective: Seen and examined in inpatient rehab.  No new event.  Denies nausea, vomiting, chest pain, shortness of breath.  His spouse  was present at the bedside.  Objective Vital signs in last 24 hours: Vitals:   04/12/22 0344 04/12/22 1412 04/12/22 1946 04/13/22 0553  BP:  106/70 116/70 120/85  Pulse:  97 100 97  Resp:  '18 17 16  '$ Temp:  98.8 F (37.1 C) 98.2 F (36.8 C) 98.4 F (36.9 C)  TempSrc:  Oral    SpO2:  100% 100% 100%  Weight: 70 kg     Height:       Weight change:   Intake/Output Summary (Last 24 hours) at 04/13/2022 0835 Last data filed at 04/13/2022 0210 Gross per 24 hour  Intake 236 ml  Output 200 ml  Net  36 ml       Labs: Basic Metabolic Panel: Recent Labs  Lab 04/06/22 2301 04/13/22 0611  NA 139 138  K 4.0 4.1  CL 105 103  CO2 21* 23  GLUCOSE 134* 108*  BUN 62* 41*  CREATININE 8.15* 6.40*  CALCIUM 8.2* 9.2  PHOS 6.7*  --    Liver Function Tests: Recent Labs  Lab 04/06/22 2301  ALBUMIN 2.6*   No results for input(s): LIPASE, AMYLASE in the last 168 hours. No results for input(s): AMMONIA in the last 168 hours. CBC: Recent Labs  Lab 04/06/22 2301 04/11/22 0511 04/13/22 0611  WBC 7.5 7.7 8.6  HGB 7.1* 7.4* 7.9*  HCT 22.5* 22.6* 24.7*  MCV 93.4 93.4 92.9  PLT 240 240 273   Cardiac Enzymes: No results for input(s): CKTOTAL, CKMB, CKMBINDEX, TROPONINI in the last 168 hours. CBG: Recent Labs  Lab 04/07/22 0626  GLUCAP 105*    Iron Studies: No results for input(s): IRON, TIBC, TRANSFERRIN, FERRITIN in the last 72 hours. Studies/Results: No results found.  Medications: Infusions:    Scheduled Medications:  acetaminophen  650 mg Oral Q6H   allopurinol  100 mg Oral Daily   vitamin C  1,000 mg Oral Daily   aspirin  81 mg Oral Daily   Chlorhexidine Gluconate Cloth  6 each Topical BID   Chlorhexidine Gluconate Cloth  6 each Topical Q0600   darbepoetin (ARANESP) injection - DIALYSIS  200 mcg Intravenous Q Tue-HD   folic acid  1 mg Oral Daily   guaiFENesin  400 mg Oral Q8H   lanthanum  1,000 mg Oral TID WC   leptospermum manuka honey  1 application. Topical Daily   melatonin  5 mg Oral QHS   metoprolol tartrate  37.5 mg Oral TID   multivitamin  1 tablet Oral QHS   pantoprazole  40 mg Oral Daily   QUEtiapine  25 mg Oral QHS    have reviewed scheduled and prn medications.  Physical Exam: General: Able to lie flat comfortable, not in distress. Heart:RRR, s1s2 nl Lungs: Clear b/l, no crackle Abdomen:soft, Non-tender, non-distended Extremities:No edema, left TMA and right BKA. Dialysis Access: Right IJ TDC.  Avneet Ashmore W 04/13/2022,8:35 AM  LOS: 10  days

## 2022-04-13 NOTE — Progress Notes (Signed)
Speech Language Pathology Daily Session Note  Patient Details  Name: Jeffrey Ayala MRN: [id number] Date of Birth: [birth year]  Today's Date: 04/13/2022 SLP Individual Time: 1003-1030 SLP Individual Time Calculation (min): 27 min  Short Term Goals: Week 1: SLP Short Term Goal 1 (Week 1): STGs=LTGs due to ELOS  Skilled Therapeutic Interventions: Skilled ST treatment focused on cognitive goals. SLP facilitated session by providing education on strategies for living a "brain healthy lifestyle" and making informed decisions to improve overall health and wellness using DANCERS acronym (i.e., disease management, activity, nutrition, cognitive stimulation, engagement, relaxation, sleep).  Provided pt and spouse with handout. Pt engaged in discussion and verbalized understanding by generating 1-2 examples on implementing such concepts at home. Spouse also providing helpful suggestions and encouragement to patient throughout. Spouse and patient feeling more confident with upcoming discharge following car transfers and additional practice with slideboard transfers with PT earlier. Spouse discussed plans to assist with medication management. Pt expressed interest in using a pillbox at home. SLP discussed training with practice pillbox during tomorrow's session. Pt in agreement. Patient was left in bed with alarm activated and immediate needs within reach at end of session. Continue per current plan of care.      Pain  None/denied  Therapy/Group: Individual Therapy  Patty Sermons 04/13/2022, 10:32 AM

## 2022-04-13 NOTE — Progress Notes (Signed)
Patient ID: Jeffrey Ayala, male   DOB: [birth year], 61 y.o.   MRN: [id number]  SW received FMLA forms from pt wife. SW left forms for physician to complete.  Loralee Pacas, MSW, [place] Office: [phone] Cell: [phone] Fax: [phone]

## 2022-04-13 NOTE — Progress Notes (Signed)
Physical Therapy Weekly Progress Note  Patient Details  Name: Jeffrey Ayala MRN: [id number] Date of Birth: Apr 26, 1961  Beginning of progress report period: Apr 04, 2022 End of progress report period: Apr 13, 2022  Today's Date: 04/13/2022 PT Individual Time: 0900-1000 1500-1540 PT Individual Time Calculation (min): 60 min and 40 min  Patient has met 4 of 4 short term goals.  Patient with great progress this week with improved pain and activity tolerance. Continues to be limited by deconditioning from prolonged hospital stay, increased fatigue from dialysis and poor sleep quality, and B NWB. Patient currently performs bed mobility with CGA-supervision, slide board and A/P transfers with supervision and intermittent min A with fatigue, and supervision-mod I for w/c propulsion and parts management, requires cues due to decreased recall secondary to cognitive deficits. Hands on family education completed today and family engaged in observation throughout patient's stay.   Patient continues to demonstrate the following deficits muscle weakness and muscle joint tightness, decreased cardiorespiratoy endurance, decreased problem solving and decreased memory, and decreased sitting balance, decreased postural control, and decreased balance strategies and therefore will continue to benefit from skilled PT intervention to increase functional independence with mobility.  Patient progressing toward long term goals.  Continue plan of care.  PT Short Term Goals Week 1:  PT Short Term Goal 1 (Week 1): supine to/from sit w/min assist PT Short Term Goal 1 - Progress (Week 1): Met PT Short Term Goal 2 (Week 1): sliding board transfer wc to/from mat w/mod assist of 2 PT Short Term Goal 2 - Progress (Week 1): Met PT Short Term Goal 3 (Week 1): Pt will tolerate 2 hr oob in wc and direct care for pressure relief as needed. PT Short Term Goal 3 - Progress (Week 1): Met PT Short Term Goal 4 (Week 1): Pt will propel  standard wc 28ft w/supervision. PT Short Term Goal 4 - Progress (Week 1): Met Week 2:  PT Short Term Goal 1 (Week 2): STG=LTG due to ELOS.  Skilled Therapeutic Interventions/Progress Updates:     Session 1: Patient in bed upon PT arrival. Patient alert and agreeable to PT session. Patient denied pain during session, continues to report poor sleep, MD made aware during rounds.   Patient's wife present for family education and hands on training throughout session. Performed safe guarding with all mobility following PT cues and/or demonstration. Educated on fall risk/prevention, home modifications to prevent falls, and activation of emergency services in the event of a fall during session.   Therapeutic Activity: Bed Mobility: Patient performed supine to/from sit with supervision in a flat be without use of bed rails. Provided verbal cues for scooting technique to move up in bed without bed rails. Transfers: Patient performed A/P transfer bed>w/c with supervision and increased time/effort for clearing hips Provided cues for head-hips relationship and reciprocal scooting throughout.  Patient performed a car transfer in his personal sedan outside the atrium entrance with his wife with set-up and supervision using a slide board with max-total A for placement and for holding board steady during the transfer. Provided cues for safe technique and set-up. Demonstrated how to break down and place the w/c in the trunk, patient's wife followed demonstration performing teach-back without cues.  Patient performed a slide board transfer w/c>bed with his wife performing set-up and assist with supervision for transfer and total A for board placement. Provided 1 cue for setting up for transfer higher in the bed to reduce need for scooting up following the transfer.  Signed off patient's wife for transfers bed<>w/c in the room, safety plan and nursing staff updated.   Patient in bed at end of session with breaks  locked, no alarm set with his wife in the room, and all needs within reach.   Session 2: Patient just returned from transport from HD, transported on the wrong day, correct to tomorrow per LPN. Patient in bed with his wife in the room upon PT arrival. Patient alert and agreeable to PT session. Patient denied pain during session.  Noted sanguinous drainage from scrotal incision with mobility during session. Patient returned to bed for wife to change dressing per education from nursing previously, at end of session. No change in incision site with moderate drainage on dressing.   Focused session on trial with loaner w/c, dropped off by w/c vender while patient was out of the room.   Therapeutic Activity: Bed Mobility: Patient performed supine to/from sit with supervision and increased effort with use of bed rail and cues for forward rotation of shoulders to reduce posterior LOB. Educated patient and his wife on options for home bed rails for reduced effort with bed mobility.  Transfers: Patient performed a slide board transfer w/c<>bed with his wife performing set-up and assist with supervision for transfer and total A for board placement. No cues needed for safe transfer.   Wheelchair Mobility:  Reviewed w/c parts and management with patient and his wife prior to patient transferring to the w/c. Provided min cues for parts management during session. Assessed w/c fit to patient: -need L leg rest extended -need R limb pad extended/longer to promote knee extension -need breaks moved forward due to difficult use for patient -patient reports the w/c veers L with propulsion -may need back height elevated for increased back support Plan for meeting with w/c tech at 8am tomorrow to make adjustments Patient propelled wheelchair >200 feet with supervision with cues for correcting L veering without improvement due to mechanical challenges.   Patient in bed with his wife performing dressing change at end  of session with breaks locked, no alarm set, and all needs within reach.   Therapy Documentation Precautions:  Precautions Precautions: Fall, Other (comment) Precaution Comments: Contact Required Braces or Orthoses: Other Brace Other Brace: limb guard Restrictions Weight Bearing Restrictions: Yes RLE Weight Bearing: Non weight bearing LLE Weight Bearing: Non weight bearing Other Position/Activity Restrictions: bilat LEs   Therapy/Group: Individual Therapy  Allani Reber L German Manke 04/13/2022, 4:53 PM

## 2022-04-13 NOTE — Progress Notes (Signed)
Discussed pt's case with nephrologist and rehab staff. Plan is for pt to receive HD tomorrow in chair. Contacted inpt HD unit to make staff aware of plan for HD in chair. Also requested order from nephrologist if that was to be needed/necessary. Pt is supposed to d/c from rehab on Wednesday and will start at out-pt clinic on Thursday. Pt will be receiving HD in chair upon d/c at out-pt clinic.   Melven Sartorius Renal Navigator [phone]

## 2022-04-13 NOTE — Discharge Instructions (Addendum)
Inpatient Rehab Discharge Instructions  Jeffrey Ayala Discharge date and time:  04/15/22   Activities/Precautions/ Functional Status: Activity: no lifting, driving, or strenuous exercise for till cleared by MD Diet: Renal 1200 mL fluid restriction Wound Care: Wash with soap and water, pat dry and apply shrinker. Contact [name] if you develop any problems with your incision/wound--redness, swelling, increase in pain, drainage or if you develop fever or chills.    --Wash scrotal area with soap and water. Keep clean and dry. Contact Dr. Alinda Money if you develop any problems with your incision/wound--redness, swelling, increase in pain, drainage or if you develop fever or chills.     Functional status:  ___ No restrictions     ___ Walk up steps independently _X__ 24/7 supervision/assistance   ___ Walk up steps with assistance ___ Intermittent supervision/assistance  ___ Bathe/dress independently ___ Walk with walker     _x__ Bathe/dress with assistance ___ Walk Independently    ___ Shower independently ___ Walk with assistance    ___ Shower with assistance _X__ No alcohol     ___ Return to work/school ________  Special Instructions: No driving smoking or alcohol 2. Follow-up [facility] for ongoing dialysis Tuesday Thursday Saturday 3.  Wound care to elbow apply a dab of MEDIHONEY to necrotic area of elbow wound and pack elbow wound with iodoform gauze (or small piece of gauze) slightly moistened with saline daily   My questions have been answered and I understand these instructions. I will adhere to these goals and the provided educational materials after my discharge from the hospital.  Patient/Caregiver Signature _______________________________ Date __________  Clinician Signature _______________________________________ Date __________  Please bring this form and your medication list with you to all your follow-up doctor's appointments.

## 2022-04-13 NOTE — Progress Notes (Signed)
Patient sutures and staples without complications. Patient tolerated well. Sanda Linger, LPN

## 2022-04-13 NOTE — Progress Notes (Signed)
PROGRESS NOTE   Subjective/Complaints: Sleep is not the greatest. Just doesn't seem to get comfortable. Pain is controlled.   ROS: Patient denies fever, rash, sore throat, blurred vision, dizziness, nausea, vomiting, diarrhea, cough, shortness of breath or chest pain, joint or back/neck pain, headache, or mood change.      Objective:   No results found.  Recent Labs    04/11/22 0511 04/13/22 0611  WBC 7.7 8.6  HGB 7.4* 7.9*  HCT 22.6* 24.7*  PLT 240 273    Recent Labs    04/13/22 0611  NA 138  K 4.1  CL 103  CO2 23  GLUCOSE 108*  BUN 41*  CREATININE 6.40*  CALCIUM 9.2     Intake/Output Summary (Last 24 hours) at 04/13/2022 0958 Last data filed at 04/13/2022 0210 Gross per 24 hour  Intake 236 ml  Output 200 ml  Net 36 ml        Physical Exam: Vital Signs Blood pressure 120/85, pulse 97, temperature 98.4 F (36.9 C), resp. rate 16, height '5\' 10"'$  (1.778 m), weight 70 kg, SpO2 100 %.    Physical Exam   Constitutional: No distress . Vital signs reviewed. HEENT: NCAT, EOMI, oral membranes moist Neck: supple Cardiovascular: RRR without murmur. No JVD    Respiratory/Chest: CTA Bilaterally without wheezes or rales. Normal effort    GI/Abdomen: BS +, non-tender, non-distended Ext: no clubbing, cyanosis, or edema Psych: pleasant and cooperative  Skin: right elbow wound dressed foam dressing. Was packed with iodoform dressing this am by nurse.  Amp sites cdi.  Scrotal dressing present with sanguinous drainage (moderate amount), scrotal swelling improved.  Foam present on right forearm. Neuro: alert and oriented x 4. STM deficits present, but has reasonable insight and awareness. Musc:  Wearing shrinkers bilaterally R 4th digit wrapped with Coban.   No change in physical exam since 04/11/22   Assessment/Plan: 1. Functional deficits which require 3+ hours per day of interdisciplinary therapy in a  comprehensive inpatient rehab setting. Physiatrist is providing close team supervision and 24 hour management of active medical problems listed below. Physiatrist and rehab team continue to assess barriers to discharge/monitor patient progress toward functional and medical goals  Care Tool:  Bathing    Body parts bathed by patient: Right arm, Left arm, Chest, Abdomen, Right upper leg, Left upper leg, Face   Body parts bathed by helper: Front perineal area, Buttocks, Left lower leg Body parts n/a: Right lower leg   Bathing assist Assist Level: Moderate Assistance - Patient 50 - 74%     Upper Body Dressing/Undressing Upper body dressing   What is the patient wearing?: Pull over shirt    Upper body assist Assist Level: Minimal Assistance - Patient > 75%    Lower Body Dressing/Undressing Lower body dressing      What is the patient wearing?: Pants     Lower body assist Assist for lower body dressing: Total Assistance - Patient < 25%     Toileting Toileting    Toileting assist Assist for toileting: 2 Helpers     Transfers Chair/bed transfer  Transfers assist     Chair/bed transfer assist level: Supervision/Verbal cueing  Locomotion Ambulation   Ambulation assist   Ambulation activity did not occur: Safety/medical concerns          Walk 10 feet activity   Assist  Walk 10 feet activity did not occur: Safety/medical concerns        Walk 50 feet activity   Assist Walk 50 feet with 2 turns activity did not occur: Safety/medical concerns         Walk 150 feet activity   Assist Walk 150 feet activity did not occur: Safety/medical concerns         Walk 10 feet on uneven surface  activity   Assist Walk 10 feet on uneven surfaces activity did not occur: Safety/medical concerns         Wheelchair     Assist Is the patient using a wheelchair?: Yes Type of Wheelchair: Manual Wheelchair activity did not occur: Safety/medical  concerns (unable to tolerate sitting upright in standard wc, TIS used)  Wheelchair assist level: Supervision/Verbal cueing Max wheelchair distance: >150 ft    Wheelchair 50 feet with 2 turns activity    Assist        Assist Level: Supervision/Verbal cueing   Wheelchair 150 feet activity     Assist      Assist Level: Supervision/Verbal cueing   Blood pressure 120/85, pulse 97, temperature 98.4 F (36.9 C), resp. rate 16, height '5\' 10"'$  (1.778 m), weight 70 kg, SpO2 100 %.  Medical Problem List and Plan: 1. Functional deficits secondary to debility, right BKA             -patient may shower             -ELOS/Goals:  5/24 Sup goals           -Continue CIR therapies including PT, OT. w/c eval per PT 2.  Antithrombotics: -DVT/anticoagulation:  Mechanical: Sequential compression devices, below knee Left lower extremity             -antiplatelet therapy: ASA  3. Pain Management: Oxycodone prn. -remains well controlled 5/22 No issues with pain. 4. Mood/sleep: pt with some definite coping issues. Doesn't remember a lot of his acute stay either  -team providing ego support.   -would like neuropsych to see  -  melatonin '5mg'$  qhs, continue seroquel '25mg'$ , has trazodone prn   -sleep is better             -antipsychotic agents: seroquel 5. Neuropsych: This patient is somewhat capable of making decisions on his own behalf. 6. Skin/Wound Care: Air mattress for pressure relief measures.              --continue Vit C, Zinc and protein supplements to help promote healing of multiple wounds.             -added juven for protein calorie malnutrition.   -shrinkers for bilateral LE's--may need smaller one soon for bka  -elbow wound needs santyl and packing-followed up with nurse today 5/19  -removing sutures/staples today 7. Fluids/Electrolytes/Nutrition: Strict I/O. Monitor weights daily.              --renal diet with 1200 cc/FR. Has been refusing nephro-->will try juven.  8. Scrotal  wound infection/gangrene s/p I and D: Completes Augmentin and Zyberxa today             --sling scrotum for support.   -5/18 continue with dressing, keep clean and dry 9. Acute kidney injury/ HD dependent--now ESRD: Still being monitored for chance of recovery --need  strict I/O. Vein mapping completed 05/12 -- HD MWF at the end of the day to help with tolerance of therapy.  --has been Clip'd for TTS at [facility].  10. BLE ischemia s/p Left TMA and R-BKA: NWB BLE and continue to use Shrinkers 11. Acute blood loss anemia: Improved and stable. Continue Aranesp 200 mcg/wk. Had 2 units PRBC on 5/8  -H/H most recently  7.1, transfuse if needed in HD  - have spoken about causes, rx above            -5/22 hgb up to 7.9 today.     Latest Ref Rng & Units 04/13/2022    6:11 AM 04/11/2022    5:11 AM 04/06/2022   11:01 PM  CBC  WBC 4.0 - 10.5 K/uL 8.6   7.7   7.5    Hemoglobin 13.0 - 17.0 g/dL 7.9   7.4   7.1    Hematocrit 39.0 - 52.0 % 24.7   22.6   22.5    Platelets 150 - 400 K/uL 273   240   240       12.  Afib: Monitor HR TID--continue metoprolol every 8 hours. Rate controlled             --no AC due to low CHADVASC score.  13. Metabolic encephalopathy/delirium: Has resolved.   14. Diarrhea: appears improved --dc fiber -continue prn's 5/22 last bm 5/20 15. Gout: appears improved  -On allopurinol '100mg'$   -Prednisone weaned off             5/22 Continue 100 allopurinol daily  LOS: 10 days A FACE TO FACE EVALUATION WAS PERFORMED  Meredith Staggers 04/13/2022, 9:58 AM

## 2022-04-14 MED ORDER — QUETIAPINE FUMARATE 25 MG PO TABS: 25.0000 mg | ORAL_TABLET | Freq: Every day | ORAL | 0 refills | Status: DC

## 2022-04-14 MED ORDER — OXYCODONE HCL 5 MG PO TABS: 5.0000 mg | ORAL_TABLET | Freq: Every day | ORAL | 0 refills | Status: DC | PRN

## 2022-04-14 MED ORDER — FOLIC ACID 1 MG PO TABS: 1.0000 mg | ORAL_TABLET | Freq: Every day | ORAL | 0 refills | Status: DC

## 2022-04-14 MED ORDER — MEDIHONEY WOUND/BURN DRESSING EX PSTE: 1.0000 "application " | PASTE | Freq: Every day | CUTANEOUS | 2 refills | Status: DC

## 2022-04-14 MED ORDER — HYDROXYZINE HCL 10 MG/5ML PO SYRP: 25.0000 mg | ORAL_SOLUTION | Freq: Two times a day (BID) | ORAL | 0 refills | Status: DC | PRN

## 2022-04-14 MED ORDER — ALLOPURINOL 100 MG PO TABS: 100.0000 mg | ORAL_TABLET | Freq: Every day | ORAL | 0 refills | Status: DC

## 2022-04-14 MED ORDER — TRAZODONE HCL 50 MG PO TABS: 25.0000 mg | ORAL_TABLET | Freq: Every evening | ORAL | 0 refills | Status: DC | PRN

## 2022-04-14 MED ORDER — LANTHANUM CARBONATE 1000 MG PO CHEW: 1000.0000 mg | CHEWABLE_TABLET | Freq: Three times a day (TID) | ORAL | 0 refills | Status: DC

## 2022-04-14 MED ORDER — RENA-VITE PO TABS: 1.0000 | ORAL_TABLET | Freq: Every day | ORAL | 0 refills | Status: DC

## 2022-04-14 MED ORDER — SIMETHICONE 80 MG PO CHEW: 80.0000 mg | CHEWABLE_TABLET | Freq: Four times a day (QID) | ORAL | 0 refills | Status: DC | PRN

## 2022-04-14 MED ORDER — ASCORBIC ACID 1000 MG PO TABS
1000.0000 mg | ORAL_TABLET | Freq: Every day | ORAL | 0 refills | Status: DC
Start: 2022-04-14 — End: 2022-05-27

## 2022-04-14 MED ORDER — METOPROLOL TARTRATE 37.5 MG PO TABS: 37.5000 mg | ORAL_TABLET | Freq: Three times a day (TID) | ORAL | 0 refills | Status: DC

## 2022-04-14 MED ORDER — PANTOPRAZOLE SODIUM 40 MG PO TBEC: 40.0000 mg | DELAYED_RELEASE_TABLET | Freq: Every day | ORAL | 1 refills | Status: DC

## 2022-04-14 MED ORDER — MELATONIN 5 MG PO TABS: 5.0000 mg | ORAL_TABLET | Freq: Every day | ORAL | 0 refills | Status: DC

## 2022-04-14 NOTE — Progress Notes (Signed)
Occupational Therapy Discharge Summary  Patient Details  Name: Jeffrey Ayala MRN: [id number] Date of Birth: 1961/04/23  Patient has met 7 of 8 long term goals due to improved activity tolerance, improved balance, postural control, ability to compensate for deficits, improved attention, and improved awareness.  Patient to discharge at overall Supervision/min A level.  Patient's care partner is independent to provide the necessary physical assistance at discharge.    Reasons goals not met: Patient still requires min A for bathing tasks due to scrotal pain and to ensure thoroughly clean in peri area.   Recommendation:  Outpatient OT for self-care retraining and deconditining  Equipment: Patient will need a wide drop arm BSC for commode. He will be unable to get into his bathroom via wheelchair and is NWB B LE's. He needs the wide drop arm for safety with slideboard transfer to get from bed or wc to the [facility]. It is not recommended that patient slideboard to regular commode surface as there is a high risk of board slipping and pt falling, thus he will require the wide drop arm bedside commode.   Specialty lightweight wheelchair with R amputee pad   Reasons for discharge: treatment goals met and discharge from hospital  Patient/family agrees with progress made and goals achieved: Yes  OT Discharge Precautions/Restrictions  Precautions Precautions: Fall Precaution Comments: Contact Restrictions Weight Bearing Restrictions: Yes RLE Weight Bearing: Non weight bearing LLE Weight Bearing: Non weight bearing Pain  Denies pain at this time ADL ADL Eating: Independent Grooming: Independent Upper Body Bathing: Supervision/safety Lower Body Bathing: Minimal assistance Upper Body Dressing: Supervision/safety Lower Body Dressing: Supervision/safety Toileting: Moderate assistance Toilet Transfer: Close supervision Perception  Perception: Within Functional Limits Praxis Praxis:  Intact Cognition Cognition Overall Cognitive Status: Impaired/Different from baseline Arousal/Alertness: Awake/alert Orientation Level: Person;Place;Situation Person: Oriented Place: Oriented Situation: Oriented Memory: Impaired Memory Impairment: Decreased recall of new information;Decreased short term memory Decreased Short Term Memory: Verbal basic;Functional basic Attention: Sustained;Selective;Alternating Sustained Attention: Impaired Sustained Attention Impairment: Verbal complex Selective Attention: Impaired Selective Attention Impairment: Verbal complex Alternating Attention Impairment: Verbal basic Awareness: Appears intact Problem Solving: Impaired Problem Solving Impairment: Functional complex;Verbal complex Safety/Judgment: Appears intact Brief Interview for Mental Status (BIMS) Repetition of Three Words (First Attempt): 3 Temporal Orientation: Year: Correct Temporal Orientation: Month: Accurate within 5 days Temporal Orientation: Day: Correct Recall: "Sock": Yes, no cue required Recall: "Blue": Yes, after cueing ("a color") Recall: "Bed": No, could not recall BIMS Summary Score: 12 Sensation Sensation Light Touch: Impaired Detail Peripheral sensation comments: distal L foot numbness tingling, mild phantom sensations on R residual limb Light Touch Impaired Details: Impaired RLE;Impaired LLE Proprioception: Appears Intact Coordination Gross Motor Movements are Fluid and Coordinated: No Fine Motor Movements are Fluid and Coordinated: Yes Coordination and Movement Description: limited by global deconditioning and lower extremity weight bearing precautions and amputations Motor  Motor Motor - Discharge Observations: Continued generalized deconditioning much improved since eval Mobility  Bed Mobility Rolling Right: Independent Rolling Left: Independent Supine to Sit: Supervision/Verbal cueing Sit to Supine: Supervision/Verbal cueing  Balance Static Sitting  Balance Static Sitting - Balance Support: No upper extremity supported;Feet unsupported Static Sitting - Level of Assistance: 7: Independent Dynamic Sitting Balance Dynamic Sitting - Balance Support: During functional activity Dynamic Sitting - Level of Assistance: 5: Stand by assistance Extremity/Trunk Assessment RUE Assessment RUE Assessment: Within Functional Limits LUE Assessment LUE Assessment: Exceptions to [facility] Active Range of Motion (AROM) Comments: still with some pain in R hand and RC injury at baseline. able  to use UE functionally   Valma Cava 04/14/2022, 2:04 PM

## 2022-04-14 NOTE — Progress Notes (Signed)
Physical Therapy Discharge Summary  Patient Details  Name: Jeffrey Ayala MRN: [id number] Date of Birth: February 24, 1961  Today's Date: 04/14/2022 PT Individual Time: 1100-1200 PT Individual Time Calculation (min): 60 min    Patient has met 4 of 7 long term goals due to improved activity tolerance, improved balance, improved postural control, increased strength, increased range of motion, decreased pain, ability to compensate for deficits, improved awareness, and improved coordination.  Patient to discharge at a wheelchair level Supervision-mod I using slide board for transfers.   Patient's care partner is independent to provide the necessary physical and cognitive assistance at discharge.  Reasons goals not met: Patient with ongoing deficits in memory and cognition, limiting independence with mobility and HEP due to reduced recall and carry-over between sessions. Patient's wife has demonstrated that she is able to assist patient at a supervision level for cues and set-up and will be available 24/7 at home.   Recommendation:  Patient will benefit from ongoing skilled PT services in home health setting to continue to advance safe functional mobility, address ongoing impairments in balance, activity tolerance, functional transfers, w/c mobility, community integration, strength/ROM, amputee education, patient/caregiver education, and minimize fall risk.  Equipment: Higher education careers adviser w/c (loaner provided by [employer] at d/c), slide board  Reasons for discharge: treatment goals met at an appropriate level with current level of support. Patient eager to go home due to long hospital stay, patient's wife in agreement.  Patient/family agrees with progress made and goals achieved: Yes  Skilled Therapeutic Intervention: Patient in bed upon PT arrival. Patient alert and agreeable to PT session. Patient denied pain during session. Per patient's wife, patient cleared for L lower extremity weight bearing by  Dr. Audrie Lia team this morning. Messaged Dr. Riley Kill who confirmed this via secure chat.   Focuses session on d/c assessment, see below, and weight bearing with transfers and initiation of sit to stand. Patient reported increased tingling without pain in distal L foot with weight bearing throughout session. Donned knee high compression sock and non-skid sock on L foot throughout session.   Therapeutic Activity: Bed Mobility: Patient performed rolling R/L and supine to/from sit with mod I . Transfers: Patient performed a slide board transfer bed>w/c with distant supervision-mod I and mod I for board placement. Provided one cue skin protection by pulling shorts down to prevent friction, otherwise patient performed transfer and w/c/board management without cues. Patient performed sit to/from stand x4 partial stands and x1 full stand on L lower extremity with mod A with PT blocking L knee in the // bars with bars starting low for pushing up and raising up for full hip extension. Provided verbal cues for hand placement forward and L weight shift, and increased knee/hip/trunk extension.  Wheelchair Mobility:  Patient propelled wheelchair >100 feet x2 and >250 feet with mod I. Demonstrated independence with all w/c parts in loaner w/c, added red tubing to breaks for improved recall of location and allowing increased pressure for locking them down.  Reviewed phantom pain/sensation and strategies for management and safety with mobility and discussed follow-up for L shoe orthosis prior to first PT session to improve progressing with transfers and standing. Patient and his wife in agreement and appreciative of patient's progress on CIR.  Patient in w/c with his wife in the room at end of session with breaks locked and all needs within reach.   PT Discharge Precautions/Restrictions Precautions Precautions: Fall Restrictions Weight Bearing Restrictions: Yes RLE Weight Bearing: Non weight bearing (BKA) LLE  Weight Bearing:  Non weight bearing (trans met amputation) Pain Interference Pain Interference Pain Effect on Sleep: 2. Occasionally Pain Interference with Therapy Activities: 1. Rarely or not at all Pain Interference with Day-to-Day Activities: 1. Rarely or not at all Vision/Perception  Vision - History Ability to See in Adequate Light: 0 Adequate Perception Perception: Within Functional Limits Praxis Praxis: Intact  Cognition Overall Cognitive Status: Impaired/Different from baseline Arousal/Alertness: Awake/alert Orientation Level: Oriented X4 Memory: Impaired Memory Impairment: Decreased recall of new information;Decreased short term memory Decreased Short Term Memory: Verbal basic;Functional basic Awareness: Appears intact Problem Solving: Impaired Problem Solving Impairment: Functional complex Safety/Judgment: Appears intact Sensation Sensation Light Touch: Impaired Detail Peripheral sensation comments: distal L foot numbness tingling, mild phantom sensations on R residual limb Light Touch Impaired Details: Impaired RLE;Impaired LLE Proprioception: Appears Intact Coordination Gross Motor Movements are Fluid and Coordinated: No Fine Motor Movements are Fluid and Coordinated: Yes Coordination and Movement Description: limited by global deconditioning and lower extremity weight bearing precautions and amputations Motor  Motor Motor: Abnormal postural alignment and control Motor - Discharge Observations: Continued generalized deconditioning and impaired postural control d/t B amputations/scrotal pain--pain avoidance positioning, NWB bilat LEs (improved overall)  Mobility Bed Mobility Rolling Right: Independent Rolling Left: Independent Supine to Sit: Supervision/Verbal cueing Sit to Supine: Supervision/Verbal cueing Transfers Lateral/Scoot Transfers: Supervision/Verbal cueing Transfer (Assistive device): Other (Comment) (slide board and A/P) Locomotion   Gait Ambulation: No Gait Gait: No Stairs / Additional Locomotion Stairs: No Ramp: Minimal Assistance - Patient >75% Wheelchair Mobility Wheelchair Mobility: Yes Wheelchair Assistance: Independent with Scientist, research (life sciences): Both upper extremities Wheelchair Parts Management: Independent Distance: >200 feet  Trunk/Postural Assessment  Cervical Assessment Cervical Assessment: Exceptions to [facility] Thoracic Assessment Thoracic Assessment: Exceptions to [facility] (rounded shoulders,post lean dominates due to pain in upright) Lumbar Assessment Lumbar Assessment: Exceptions to [facility] (post rotation of pelvis + post lean due to pain) Postural Control Postural Control: Deficits on evaluation (pain, BKA, transmet amp, NWB bilat. leans post to offload scrotum)  Balance Balance Balance Assessed: Yes Static Sitting Balance Static Sitting - Balance Support: No upper extremity supported;Feet unsupported Static Sitting - Level of Assistance: 7: Independent Dynamic Sitting Balance Dynamic Sitting - Balance Support: Feet unsupported;Right upper extremity supported;Left upper extremity supported Dynamic Sitting - Level of Assistance: 5: Stand by assistance (due to leaning posteriorly during transfer/sliding tendency w/sliding board transfer, pain) Extremity Assessment  RLE Assessment RLE Assessment: Exceptions to [facility] Active Range of Motion (AROM) Comments: knee flexion 110 deg, extension 0 deg General Strength Comments: Grossly in sitting: hip flex/abd/add 4/5, knee flex/ext 5/5 LLE Assessment LLE Assessment: Exceptions to [facility] Active Range of Motion (AROM) Comments: ankle DF lacks >10 deg General Strength Comments: Grossly in sitting: hip flex/abd/add 4/5, knee flex/ext 5/5, ankle DF 3+/5, PF 4+/5    Jeffrey Ayala PT, DPT  04/14/2022, 12:26 PM

## 2022-04-14 NOTE — Progress Notes (Signed)
Patient ID: Jeffrey Ayala, male   DOB: 1961/02/23, 61 y.o.   MRN: [id number]    Subjective: Pt continuing to progressively improve with plans for discharge from rehab soon.  Objective: Vital signs in last 24 hours: Temp:  [98.9 F (37.2 C)] 98.9 F (37.2 C) (05/22 1546) Pulse Rate:  [104] 104 (05/22 1546) Resp:  [20] 20 (05/22 1546) BP: (121)/(84) 121/84 (05/22 1546) SpO2:  [99 %] 99 % (05/22 1546)  Intake/Output from previous day: 05/22 0701 - 05/23 0700 In: 735 [P.O.:735] Out: 300 [Urine:300] Intake/Output this shift: No intake/output data recorded.  Physical Exam:  General: Alert and oriented GU: Scrotal incision still with some serous drainage as expected with loosely approximated sutures but no erythema, edema, fluctuance, etc.  Healing well.  Lab Results: Recent Labs    04/13/22 0611  HGB 7.9*  HCT 24.7*   BMET Recent Labs    04/13/22 0611  NA 138  K 4.1  CL 103  CO2 23  GLUCOSE 108*  BUN 41*  CREATININE 6.40*  CALCIUM 9.2     Studies/Results: No results found.  Assessment/Plan: 1) Scrotal infection s/p debridement and closure.  Healing well.  Continue to allow some drainage but wound is healing well.  Will arrange outpatient follow up to check wound in about 2 weeks.   LOS: 11 days   Dutch Gray 04/14/2022, 8:03 AM

## 2022-04-14 NOTE — Progress Notes (Signed)
Pt scheduled to d/c from rehab tomorrow. Contacted [facility] and spoke to [facility]. Clinic is aware of pt's d/c tomorrow and that pt will start at clinic on Thursday. Spoke to pt's wife via phone. Discussed out-pt arrangements again and reminded wife that pt will need to arrive at 9:45 on Thursday to complete paperwork prior to treatment. Wife voices understanding and agreeable to plans. Appts added to pt's AVS. Contacted renal PA regarding clinic's need for orders for Thursday. Will assist as needed.   Melven Sartorius Renal Navigator [phone]

## 2022-04-14 NOTE — Progress Notes (Signed)
Inpatient Rehabilitation Discharge Medication Review by a Pharmacist  A complete drug regimen review was completed for this patient to identify any potential clinically significant medication issues.  High Risk Drug Classes Is patient taking? Indication by Medication  Antipsychotic Yes Seroquel- sleep  Anticoagulant No   Antibiotic No   Opioid Yes OxyIR- acute pain  Antiplatelet Yes Aspirin- CVA prophylaxis  Hypoglycemics/insulin No   Vasoactive Medication Yes Lopressor- hypertension  Chemotherapy No   Other Yes Allopurinol- gout Melatonin, trazodone- sleep Protonix- GERD Fosrenol- hyperphosphatemia 2/2 ESRD HD dependant     Type of Medication Issue Identified Description of Issue Recommendation(s)  Drug Interaction(s) (clinically significant)     Duplicate Therapy     Allergy     No Medication Administration End Date     Incorrect Dose     Additional Drug Therapy Needed     Significant med changes from prior encounter (inform family/care partners about these prior to discharge).    Other       Clinically significant medication issues were identified that warrant physician communication and completion of prescribed/recommended actions by midnight of the next day:  No   Time spent performing this drug regimen review (minutes):  30  Shaylon Aden BS, PharmD, BCPS Clinical Pharmacist 04/15/2022 10:48 AM  Contact: [phone] after 3 PM  "Be curious, not judgmental..." -Jamal Maes

## 2022-04-14 NOTE — Progress Notes (Addendum)
Patient ID: Jeffrey Ayala, male   DOB: 06/03/1961, 61 y.o.   MRN: [id number]  SW faxed FMLA forms for pt wife to [place] and Leave [phone]).  SW met with pt wife in room to provide original forms for FMLA. SW discussed d/c tomorrow, and will f/u with therapy to discuss if bariatric DABSC is needed. Sw explained to pt wife can not ensure it will be covered under insurance but efforts will be made. SW informed will f/u once ordered. SW will set up pt for outpatient PT only due to recent updates on WB status.   SW confirmed with OT DME item needed.   SW ordered bariatric DABSC with [employer] via parachute with appropriate documentation to support need. SW faxed outpatient PT referral to [facility] ([phone]/[phone]).  SW called pt wife [name] to inform on above, and encouraged her to wait on follow-up from [place].   *SW received updates from [place] of item and will ship to home. Asked if they can follow-up with family. SW spoke with pt wife to inform on above as well.   Loralee Pacas, MSW, [place] Office: [phone] Cell: [phone] Fax: [phone]

## 2022-04-14 NOTE — Progress Notes (Signed)
[facility] NEPHROLOGY PROGRESS NOTE  Assessment/ Plan:  1)  Acute kidney Injury: ATN from severe septic shock in the setting of foot wounds. He has been on dialysis dependent since 02/26/2022 (initially started with CRRT and transition to IHD with first tx 4/12).  Vein mapping done.  They would really like to get him through rehab stay before doing an AVF - HD per TTS schedule; he's on for 2nd shift. We are going to try in a chair today. I offered to help him with moving the wheelchair cushion but he declined.  We have clipped as AKI for now and watching for recovery, though may be end stage given prolonged dependence on dialysis.  He has an outpatient HD unit (as an AKI) at [facility] on TTS schedule -Status post HD on 5/20 with UF around 3 L, tolerated well.    - Strict I&O's -> won't check 24hr clearance unless UOP is adequate (at minimum >747m/24hr period)  2) Scrotal edema/wound: Status post surgical debridement by urology on 4/24 and on 5/6 underwent I&D of scrotum with wound closure and drain placement.    3)  Leg wound status post left TMA and right below-knee amputation on 03/15/2022: Septic shock resolved.    4)  Anemia - acute blood loss: s/p PRBC's multiple units. CT abd was negative for acute process.  Iron level acceptable and currently on Aranesp 200 mcg.  Monitor hemoglobin.  5)  CKD-MBD: On Fosrenol.  Monitor calcium, phosphorus.  Subjective: Seen and examined in inpatient rehab.  No new event.  Denies nausea, vomiting, chest pain, shortness of breath.  His spouse  was present at the bedside.  Objective Vital signs in last 24 hours: Vitals:   04/12/22 1946 04/13/22 0553 04/13/22 1543 04/13/22 1546  BP: 116/70 120/85 121/84 121/84  Pulse: 100 97 (!) 104 (!) 104  Resp: '17 16  20  '$ Temp: 98.2 F (36.8 C) 98.4 F (36.9 C)  98.9 F (37.2 C)  TempSrc:    Oral  SpO2: 100% 100%  99%  Weight:      Height:       Weight change:   Intake/Output Summary  (Last 24 hours) at 04/14/2022 1408 Last data filed at 04/14/2022 0815 Gross per 24 hour  Intake 680 ml  Output 300 ml  Net 380 ml       Labs: Basic Metabolic Panel: Recent Labs  Lab 04/13/22 0611  NA 138  K 4.1  CL 103  CO2 23  GLUCOSE 108*  BUN 41*  CREATININE 6.40*  CALCIUM 9.2   Liver Function Tests: No results for input(s): AST, ALT, ALKPHOS, BILITOT, PROT, ALBUMIN in the last 168 hours.  No results for input(s): LIPASE, AMYLASE in the last 168 hours. No results for input(s): AMMONIA in the last 168 hours. CBC: Recent Labs  Lab 04/11/22 0511 04/13/22 0611  WBC 7.7 8.6  HGB 7.4* 7.9*  HCT 22.6* 24.7*  MCV 93.4 92.9  PLT 240 273   Cardiac Enzymes: No results for input(s): CKTOTAL, CKMB, CKMBINDEX, TROPONINI in the last 168 hours. CBG: No results for input(s): GLUCAP in the last 168 hours.   Iron Studies: No results for input(s): IRON, TIBC, TRANSFERRIN, FERRITIN in the last 72 hours. Studies/Results: No results found.  Medications: Infusions:    Scheduled Medications:  acetaminophen  650 mg Oral Q6H   allopurinol  100 mg Oral Daily   vitamin C  1,000 mg Oral Daily   aspirin  81 mg Oral Daily  Chlorhexidine Gluconate Cloth  6 each Topical BID   darbepoetin (ARANESP) injection - DIALYSIS  200 mcg Intravenous Q Tue-HD   folic acid  1 mg Oral Daily   guaiFENesin  400 mg Oral Q8H   lanthanum  1,000 mg Oral TID WC   leptospermum manuka honey  1 application. Topical Daily   melatonin  5 mg Oral QHS   metoprolol tartrate  37.5 mg Oral TID   multivitamin  1 tablet Oral QHS   pantoprazole  40 mg Oral Daily   QUEtiapine  25 mg Oral QHS    have reviewed scheduled and prn medications.  Physical Exam: General: Able to lie flat comfortable, not in distress. Heart:RRR, s1s2 nl Lungs: Clear b/l, no crackle Abdomen:soft, Non-tender, non-distended Extremities:No edema, left TMA and right BKA. Dialysis Access: Right IJ TDC.  Spenser Harren W 04/14/2022,2:08  PM  LOS: 11 days

## 2022-04-14 NOTE — Progress Notes (Signed)
Orthopedic Tech Progress Note Patient Details:  Jeffrey Ayala June 11, 1961 [id number]  Called in order to [employer] for a SMALLER BKA SHRINKER   Patient ID: ACESON LABELL, male   DOB: 04/14/1961, 61 y.o.   MRN: [id number]  Janit Pagan 04/14/2022, 11:44 AM

## 2022-04-14 NOTE — Progress Notes (Signed)
Speech Language Pathology Discharge Summary  Patient Details  Name: Jeffrey Ayala MRN: [id number] Date of Birth: [birth year]  Today's Date: 04/14/2022 SLP Individual Time: 1000-1100 SLP Individual Time Calculation (min): 60 min  Skilled Therapeutic Interventions:  Skilled ST treatment focused on cognitive goals. SLP facilitated session by providing overall sup A for loading medications into QID pillbox with error x1. Supervision level cueing was beneficial for awareness of error and use of organizational strategies. Spouse plans to provide supervision with medication management at discharge as discussed. SLP facilitated complex deductive reasoning tasks with overall sup A verbal cues for problem solving. Pt reports feeling near cognitive baseline however not yet "back to normal." SLP providing education on cognitively stimulating activities at home. Spouse verbalized ideas to support pt's independence with work related activities, as well as leisure activities to support quality of life. Patient was left in bed with alarm activated and immediate needs within reach at end of session. Continue per current plan of care.    Patient has met 2 of 2 long term goals.  Patient to discharge at overall Supervision level.  Reasons goals not met: All goals were met   Clinical Impression/Discharge Summary: Patient has made functional gains and has met 2 of 2 long-term goals this admission due to improved problem solving, functional recall, and carry over of educational discussions. Patient is currently completing functional and complex cognitive tasks with supervision A verbal cues. Patient and family education is complete and patient to discharge at overall supervision level. Patient's care partner is independent to provide the necessary physical and cognitive assistance at discharge. Patient would benefit from continued SLP services in home health setting to maximize cognitive function and functional  independence.   Care Partner:  Caregiver Able to Provide Assistance: Yes  Type of Caregiver Assistance: Physical;Cognitive  Recommendation:  24 hour supervision/assistance;Home Health SLP  Rationale for SLP Follow Up: Maximize cognitive function and independence;Reduce caregiver burden   Equipment: None recommended   Reasons for discharge: Treatment goals met;Discharged from hospital   Patient/Family Agrees with Progress Made and Goals Achieved: Yes    Patty Sermons 04/14/2022, 12:26 PM

## 2022-04-14 NOTE — Plan of Care (Signed)
  Problem: RH Awareness Goal: LTG: Patient will demonstrate awareness during functional activites type of (OT) Description: LTG: Patient will demonstrate awareness during functional activites type of (OT) Outcome: Completed/Met   Problem: RH Memory Goal: LTG Patient will demonstrate ability for day to day recall/carry over during activities of daily living with assistance level (OT) Description: LTG:  Patient will demonstrate ability for day to day recall/carry over during activities of daily living with assistance level (OT). Outcome: Completed/Met   Problem: RH Toilet Transfers Goal: LTG Patient will perform toilet transfers w/assist (OT) Description: LTG: Patient will perform toilet transfers with assist, with/without cues using equipment (OT) Outcome: Completed/Met   Problem: RH Memory Goal: LTG Patient will demonstrate ability for day to day recall/carry over during activities of daily living with assistance level (OT) Description: LTG:  Patient will demonstrate ability for day to day recall/carry over during activities of daily living with assistance level (OT). Outcome: Completed/Met   Problem: RH Awareness Goal: LTG: Patient will demonstrate awareness during functional activites type of (OT) Description: LTG: Patient will demonstrate awareness during functional activites type of (OT) Outcome: Completed/Met   Problem: RH Balance Goal: LTG: Patient will maintain dynamic sitting balance (OT) Description: LTG:  Patient will maintain dynamic sitting balance with assistance during activities of daily living (OT) Outcome: Completed/Met   Problem: RH Grooming Goal: LTG Patient will perform grooming w/assist,cues/equip (OT) Description: LTG: Patient will perform grooming with assist, with/without cues using equipment (OT) Outcome: Completed/Met

## 2022-04-14 NOTE — Progress Notes (Addendum)
PROGRESS NOTE   Subjective/Complaints: No new issues. Staples/sutures out without issue. Happy to be going home tomorrow.   ROS: Patient denies fever, rash, sore throat, blurred vision, dizziness, nausea, vomiting, diarrhea, cough, shortness of breath or chest pain, joint or back/neck pain, headache, or mood change.      Objective:   No results found.  Recent Labs    04/13/22 0611  WBC 8.6  HGB 7.9*  HCT 24.7*  PLT 273    Recent Labs    04/13/22 0611  NA 138  K 4.1  CL 103  CO2 23  GLUCOSE 108*  BUN 41*  CREATININE 6.40*  CALCIUM 9.2     Intake/Output Summary (Last 24 hours) at 04/14/2022 1119 Last data filed at 04/14/2022 0815 Gross per 24 hour  Intake 857 ml  Output 300 ml  Net 557 ml        Physical Exam: Vital Signs Blood pressure 121/84, pulse (!) 104, temperature 98.9 F (37.2 C), temperature source Oral, resp. rate 20, height '5\' 10"'$  (1.778 m), weight 70 kg, SpO2 99 %.    Physical Exam   Constitutional: No distress . Vital signs reviewed. HEENT: NCAT, EOMI, oral membranes moist Neck: supple Cardiovascular: RRR without murmur. No JVD    Respiratory/Chest: CTA Bilaterally without wheezes or rales. Normal effort    GI/Abdomen: BS +, non-tender, non-distended Ext: no clubbing, cyanosis, or edema Psych: pleasant and cooperative  Skin: right elbow wound dressed foam dressing. Center pink/granulating. Amp sites cdi with sutures and staples out.  Scrotal dressing present with sanguinous drainage (moderate amount), scrotal swelling improved.  Foam present on right forearm. Neuro: alert and oriented x 4. STM deficits present, but has reasonable insight and awareness.--stable Musc:  Wearing shrinkers bilaterally--loose R 4th digit wrapped with Coban.   No change in physical exam since 04/11/22   Assessment/Plan: 1. Functional deficits which require 3+ hours per day of interdisciplinary therapy  in a comprehensive inpatient rehab setting. Physiatrist is providing close team supervision and 24 hour management of active medical problems listed below. Physiatrist and rehab team continue to assess barriers to discharge/monitor patient progress toward functional and medical goals  Care Tool:  Bathing    Body parts bathed by patient: Right arm, Left arm, Chest, Abdomen, Right upper leg, Left upper leg, Face   Body parts bathed by helper: Front perineal area, Buttocks, Left lower leg Body parts n/a: Right lower leg   Bathing assist Assist Level: Moderate Assistance - Patient 50 - 74%     Upper Body Dressing/Undressing Upper body dressing   What is the patient wearing?: Pull over shirt    Upper body assist Assist Level: Minimal Assistance - Patient > 75%    Lower Body Dressing/Undressing Lower body dressing      What is the patient wearing?: Pants     Lower body assist Assist for lower body dressing: Total Assistance - Patient < 25%     Toileting Toileting    Toileting assist Assist for toileting: Moderate Assistance - Patient 50 - 74%     Transfers Chair/bed transfer  Transfers assist     Chair/bed transfer assist level: Supervision/Verbal cueing  Locomotion Ambulation   Ambulation assist   Ambulation activity did not occur: Safety/medical concerns          Walk 10 feet activity   Assist  Walk 10 feet activity did not occur: Safety/medical concerns        Walk 50 feet activity   Assist Walk 50 feet with 2 turns activity did not occur: Safety/medical concerns         Walk 150 feet activity   Assist Walk 150 feet activity did not occur: Safety/medical concerns         Walk 10 feet on uneven surface  activity   Assist Walk 10 feet on uneven surfaces activity did not occur: Safety/medical concerns         Wheelchair     Assist Is the patient using a wheelchair?: Yes Type of Wheelchair: Manual Wheelchair activity  did not occur: Safety/medical concerns (unable to tolerate sitting upright in standard wc, TIS used)  Wheelchair assist level: Supervision/Verbal cueing Max wheelchair distance: >150 ft    Wheelchair 50 feet with 2 turns activity    Assist        Assist Level: Supervision/Verbal cueing   Wheelchair 150 feet activity     Assist      Assist Level: Supervision/Verbal cueing   Blood pressure 121/84, pulse (!) 104, temperature 98.9 F (37.2 C), temperature source Oral, resp. rate 20, height '5\' 10"'$  (1.778 m), weight 70 kg, SpO2 99 %.  Medical Problem List and Plan: 1. Functional deficits secondary to debility, right BKA             -patient may shower             -ELOS/Goals:  5/24 Sup goals          -Continue CIR therapies including PT, OT, and SLP   -f/u with [facility] in 2-3 weeks 2.  Antithrombotics: -DVT/anticoagulation:  Mechanical: Sequential compression devices, below knee Left lower extremity             -antiplatelet therapy: ASA  3. Pain Management: Oxycodone prn. -remains well controlled 5/23 No issues with pain. 4. Mood/sleep: pt with some definite coping issues. Doesn't remember a lot of his acute stay either  -team providing ego support.   -appreciate neuropsych input  -  melatonin '5mg'$  qhs, continue seroquel '25mg'$ , has trazodone prn   -sleep is better             -antipsychotic agents: seroquel 5. Neuropsych: This patient is somewhat capable of making decisions on his own behalf. 6. Skin/Wound Care: Air mattress for pressure relief measures.              --continue Vit C, Zinc and protein supplements to help promote healing of multiple wounds.             -added juven for protein calorie malnutrition.   -shrinkers for bilateral LE's--may need smaller one soon for bka  -elbow wound needs santyl and packing-followed up with nurse today 5/19  -  sutures/staples out without any problems  -ordered smaller shrinker today  -wife needs education re: wound care 7.  Fluids/Electrolytes/Nutrition: Strict I/O. Monitor weights daily.              --renal diet with 1200 cc/FR. Has been refusing nephro-->will try juven.  8. Scrotal wound infection/gangrene s/p I and D: Completed Augmentin and Zyberxa              --sling scrotum for support.   -  5/22 wound healing, keep clean/dry--f/u with urology in 2 weeks 9. Acute kidney injury/ HD dependent--now ESRD: Still being monitored for chance of recovery --need strict I/O. Vein mapping completed 05/12 -- HD MWF at the end of the day to help with tolerance of therapy.  --has been Clip'd for TTS at [facility]. F/u arranged 10. BLE ischemia s/p Left TMA and R-BKA: NWB BLE and continue to use Shrinkers 11. Acute blood loss anemia: Improved and stable. Continue Aranesp 200 mcg/wk. Had 2 units PRBC on 5/8  -H/H most recently  7.1, transfuse if needed in HD  - have spoken about causes, rx above            -5/22 hgb up to 7.9       Latest Ref Rng & Units 04/13/2022    6:11 AM 04/11/2022    5:11 AM 04/06/2022   11:01 PM  CBC  WBC 4.0 - 10.5 K/uL 8.6   7.7   7.5    Hemoglobin 13.0 - 17.0 g/dL 7.9   7.4   7.1    Hematocrit 39.0 - 52.0 % 24.7   22.6   22.5    Platelets 150 - 400 K/uL 273   240   240       12.  Afib: Monitor HR TID--continue metoprolol every 8 hours. Rate controlled             --no AC due to low CHADVASC score.  13. Metabolic encephalopathy/delirium: Has resolved.   14. Diarrhea: appears improved --dc fiber -continue prn's  last bm 5/22 15. Gout: appears improved  -On allopurinol '100mg'$   -Prednisone weaned off              Continue 100 allopurinol daily  LOS: 11 days A FACE TO FACE EVALUATION WAS PERFORMED  Meredith Staggers 04/14/2022, 11:19 AM

## 2022-04-14 NOTE — Patient Care Conference (Signed)
Inpatient RehabilitationTeam Conference and Plan of Care Update Date: 04/14/2022   Time: 10:02 AM    Patient Name: Jeffrey Ayala      Medical Record Number: [id number]  Date of Birth: 28-Jun-1961 Sex: Male         Room/Bed: 4M10C/4M10C-01 Payor Info: Payor: [place] / Plan: BCBS COMM PPO / Product Type: *No Product type* /    Admit Date/Time:  04/03/2022  2:43 PM  Primary Diagnosis:  S/P BKA (below knee amputation), right [facility])  Hospital Problems: Principal Problem:   S/P BKA (below knee amputation), right ([place]) Active Problems:   Unilateral complete BKA, right, initial encounter ([place])   Arthralgia of right lower leg   Acute blood loss anemia    Expected Discharge Date: Expected Discharge Date: 04/15/22  Team Members Present: Physician leading conference: Dr. Alger Simons Social Worker Present: Loralee Pacas, [place] Nurse Present: Dorthula Nettles, RN PT Present: Apolinar Junes, PT OT Present: Mariane Masters, OT SLP Present: Weston Anna, SLP PPS Coordinator present : Gunnar Fusi, SLP     Current Status/Progress Goal Weekly Team Focus  Bowel/Bladder   1211m FR maintained, pt ot always compliant despite education; Dialysis: T-TH-S; voided 3032mclear amber urine per urinal thus far this shift  maintain current kidney function with voiding  routine monitoring/documentation of strict I&O's   Swallow/Nutrition/ Hydration             ADL's   supervision-MIN A for transfers and ADLs  supervision  ready for DC   Mobility   Supervision-mod I overall, patient's wife completed family education and in agreement to have patient d/c at this level of assist  Mod I overall, w/c level  D/c assessment, patient/caregiver education, review HEP   Communication             Safety/Cognition/ Behavioral Observations  min A for problem solving, sup A memory with compensations  sup A  education, medication management, complex problem solving, compensatory  memory   Pain   denied pain when asked this shift, no acute distress note  maintain patient comfort AEB pain rating 0-3 on pain scale.  routine & Prn asessment/documentation of pain/discomfort.   Skin   surgical incisions, skin tears, MDRO (scrotum)  no new breakdown  routine assessment/documentation QS, & PRN     Discharge Planning:  D/c to home with support from wife who works from home., Will have support from neighbors and church family. Ramp built and loaner w/c received by [employer].   Team Discussion: Wounds healing. Provide education to spouse for elbow wound care, needs to be packed. Medically stable and ready for discharge. Continent B/B, reports no pain. All incisions healing with no s/s infection or breakdown. Discharging home with spouse, referring to Outpatient PT Neuro Rehab.   Patient on target to meet rehab goals: yes, Mod I overall, WC level. Still requiring some assist with pants after toileting. Spouse assisting with everything.  *See Care Plan and progress notes for long and short-term goals.   Revisions to Treatment Plan:  Finalizing discharge plans.   Teaching Needs:  Family education complete.   Current Barriers to Discharge: Wound care  Possible Resolutions to Barriers: Teach spouse wound care for elbow Setup follow-up therapy     Medical Summary Current Status: wounds healing. pain controlled. esrd/hd per nephro, mood improved  Barriers to Discharge: Medical stability   Possible Resolutions to [employer]: daily assessment of labs and patient data, education re wound care   Continued Need  for Acute Rehabilitation Level of Care: The patient requires daily medical management by a physician with specialized training in physical medicine and rehabilitation for the following reasons: Direction of a multidisciplinary physical rehabilitation program to maximize functional independence : Yes Medical management of patient stability for increased  activity during participation in an intensive rehabilitation regime.: Yes Analysis of laboratory values and/or radiology reports with any subsequent need for medication adjustment and/or medical intervention. : Yes   I attest that I was present, lead the team conference, and concur with the assessment and plan of the team.   Cristi Loron 04/14/2022, 1:58 PM

## 2022-04-14 NOTE — Progress Notes (Signed)
Occupational Therapy Session Note  Patient Details  Name: Jeffrey Ayala MRN: [id number] Date of Birth: March 15, 1961  Today's Date: 04/14/2022 OT Individual Time: 1724-1954 OT Individual Time Calculation (min): 73 min    Short Term Goals: Week 2:  OT Short Term Goal 1 (Week 2): STG= LTG d/t ELOS  Skilled Therapeutic Interventions/Progress Updates:    Pt greeted semi-reclined in bed with spouse present. Pt reported he had already washed up with assistance of his spouse. Pt's spouse has been cleared to assist with slideboard transfers, and she reported these are going well. Spouse assisted with board placement and stabilizing wc while pt performed slideboard transfer to loaner wc. Pt completed grooming tasks from wc at the sink. OT educated pt and spouse on rolling wc up to the sink by opening cabinets to have room for lower extremities. W/C tech then entered as well as PT and pt propelled wc to therapy gym mod I. W/c tech assisted with making modifications to leg rest, wc back height, breaks, and wc veering to the L. While w/c tech removed back from wc, worked on trunk control and siting balance with seated ball toss + oblique twist, 3 sets of 10.  After w/c adjustments, worked on going up and down ramp with pt needing min A due to difficulty grasping wheels. Per discussion w/ wc tech, he is going to come to patients house and change out wheels to harder ones that will grip the floor differently and can be removed for easier transport. Pt propelled wc back to room mod I and left in wc with spouse present and needs met.  Patient will need a wide drop arm BSC for commode. He will be unable to get into his bathroom via wheelchair and is NWB B LE's. He needs the wide drop arm for safety with slideboard transfer to get from bed or wc to the [facility]. It is not recommended that patient slideboard to regular commode surface as there is a high risk of board slipping and pt falling, thus he will require the wide  drop arm bedside commode.   Therapy Documentation Precautions:  Precautions Precautions: Fall Precaution Comments: Contact Required Braces or Orthoses: Other Brace Other Brace: limb guard Restrictions Weight Bearing Restrictions: Yes RLE Weight Bearing: Non weight bearing (BKA) LLE Weight Bearing: Non weight bearing (trans met amputation) Other Position/Activity Restrictions: bilat LEs Pain:  Denies pain   Therapy/Group: Individual Therapy  Valma Cava 04/14/2022, 8:27 AM

## 2022-04-15 LAB — PHOSPHORUS: Phosphorus: 4.8 mg/dL — ABNORMAL HIGH (ref 2.5–4.6)

## 2022-04-15 LAB — OCCULT BLOOD X 1 CARD TO LAB, STOOL: Fecal Occult Bld: NEGATIVE

## 2022-04-15 MED ORDER — DARBEPOETIN ALFA 200 MCG/0.4ML IJ SOSY
200.0000 ug | PREFILLED_SYRINGE | Freq: Once | INTRAMUSCULAR | Status: AC
  Administered 2022-04-15: 200 ug via SUBCUTANEOUS
  Filled 2022-04-15: qty 0.4

## 2022-04-15 NOTE — Progress Notes (Signed)
Pt d/c from rehab today. Contacted [facility] to confirm pt's d/c and that pt will start tomorrow.   Melven Sartorius Renal Navigator [phone]

## 2022-04-15 NOTE — Progress Notes (Signed)
PROGRESS NOTE   Subjective/Complaints: Pt up in bed. Excited about getting home! Received smaller shrinkers.   ROS: Patient denies fever, rash, sore throat, blurred vision, dizziness, nausea, vomiting, diarrhea, cough, shortness of breath or chest pain, joint or back/neck pain, headache, or mood change.      Objective:   No results found.  Recent Labs    04/13/22 0611  WBC 8.6  HGB 7.9*  HCT 24.7*  PLT 273    Recent Labs    04/13/22 0611  NA 138  K 4.1  CL 103  CO2 23  GLUCOSE 108*  BUN 41*  CREATININE 6.40*  CALCIUM 9.2     Intake/Output Summary (Last 24 hours) at 04/15/2022 0823 Last data filed at 04/14/2022 1845 Gross per 24 hour  Intake --  Output 2500 ml  Net -2500 ml        Physical Exam: Vital Signs Blood pressure 120/68, pulse 98, temperature 98.5 F (36.9 C), temperature source Oral, resp. rate 18, height '5\' 10"'$  (1.778 m), weight 92 kg, SpO2 99 %.    Physical Exam   Constitutional: No distress . Vital signs reviewed. HEENT: NCAT, EOMI, oral membranes moist Neck: supple Cardiovascular: RRR without murmur. No JVD    Respiratory/Chest: CTA Bilaterally without wheezes or rales. Normal effort    GI/Abdomen: BS +, non-tender, non-distended Ext: no clubbing, cyanosis, or edema Psych: pleasant and cooperative  Skin: right elbow wound dressed foam dressing. Center pink/granulating. Amp sites cdi with sutures and staples out.  Scrotal dressing present with sanguinous drainage (moderate amount), scrotal swelling improved.  Foam present on right forearm. Neuro: alert and oriented x 4. STM deficits present, but has reasonable insight and awareness.--stable Musc:  shrinker with appropriate fit R 4th digit wrapped with Coban.       Assessment/Plan: 1. Functional deficits which require 3+ hours per day of interdisciplinary therapy in a comprehensive inpatient rehab setting. Physiatrist is  providing close team supervision and 24 hour management of active medical problems listed below. Physiatrist and rehab team continue to assess barriers to discharge/monitor patient progress toward functional and medical goals  Care Tool:  Bathing    Body parts bathed by patient: Right arm, Left arm, Chest, Abdomen, Right upper leg, Left upper leg, Face, Left lower leg, Front perineal area   Body parts bathed by helper: Buttocks Body parts n/a: Right lower leg   Bathing assist Assist Level: Minimal Assistance - Patient > 75%     Upper Body Dressing/Undressing Upper body dressing   What is the patient wearing?: Pull over shirt    Upper body assist Assist Level: Set up assist    Lower Body Dressing/Undressing Lower body dressing      What is the patient wearing?: Pants     Lower body assist Assist for lower body dressing: Supervision/Verbal cueing     Toileting Toileting    Toileting assist Assist for toileting: Moderate Assistance - Patient 50 - 74%     Transfers Chair/bed transfer  Transfers assist     Chair/bed transfer assist level: Supervision/Verbal cueing Chair/bed transfer assistive device: Sliding board   Locomotion Ambulation   Ambulation assist   Ambulation activity did  not occur: Safety/medical concerns (B LE NWB until grad day with L WBAT)          Walk 10 feet activity   Assist  Walk 10 feet activity did not occur: Safety/medical concerns        Walk 50 feet activity   Assist Walk 50 feet with 2 turns activity did not occur: Safety/medical concerns         Walk 150 feet activity   Assist Walk 150 feet activity did not occur: Safety/medical concerns         Walk 10 feet on uneven surface  activity   Assist Walk 10 feet on uneven surfaces activity did not occur: Safety/medical concerns         Wheelchair     Assist Is the patient using a wheelchair?: Yes Type of Wheelchair: Manual Wheelchair activity did  not occur: Safety/medical concerns (unable to tolerate sitting upright in standard wc, TIS used)  Wheelchair assist level: Independent Max wheelchair distance: >200 ft    Wheelchair 50 feet with 2 turns activity    Assist        Assist Level: Independent   Wheelchair 150 feet activity     Assist      Assist Level: Independent   Blood pressure 120/68, pulse 98, temperature 98.5 F (36.9 C), temperature source Oral, resp. rate 18, height '5\' 10"'$  (1.778 m), weight 92 kg, SpO2 99 %.  Medical Problem List and Plan: 1. Functional deficits secondary to debility, right BKA             -patient may shower             -dc home today!   -f/u with [facility] in 2-3 weeks, ortho/uro/nephro f/u  -prosthetic/orthotic f/u over next few weeks too, including orthotic for left shoe 2.  Antithrombotics: -DVT/anticoagulation:  Mechanical: Sequential compression devices, below knee Left lower extremity             -antiplatelet therapy: ASA  3. Pain Management: Oxycodone prn. -remains well controlled 5/24 No issues with pain. 4. Mood/sleep: pt with some definite coping issues. Doesn't remember a lot of his acute stay either  -team providing ego support.   -appreciate neuropsych input  -  melatonin '5mg'$  qhs, continue seroquel '25mg'$ , has trazodone prn   -sleep is better--can continue seroquel initially at home             -antipsychotic agents: seroquel 5. Neuropsych: This patient is somewhat capable of making decisions on his own behalf. 6. Skin/Wound Care: Air mattress for pressure relief measures.              --continue Vit C, Zinc and protein supplements to help promote healing of multiple wounds.             -added juven for protein calorie malnutrition.   -shrinkers for bilateral LE's--may need smaller one soon for bka  -elbow wound needs santyl and packing-followed up with nurse today 5/19  -  sutures/staples out without any problems  -received smaller shrinkers    7.  Fluids/Electrolytes/Nutrition: Strict I/O. Monitor weights daily.              --renal diet with 1200 cc/FR. Has been refusing nephro-->will try juven.  8. Scrotal wound infection/gangrene s/p I and D: Completed Augmentin and Zyberxa              --sling scrotum for support.   -5/22 wound healing, keep clean/dry--f/u with urology in 2 weeks  9. Acute kidney injury/ HD dependent--now ESRD: Still being monitored for chance of recovery --need strict I/O. Vein mapping completed 05/12 -- HD MWF at the end of the day to help with tolerance of therapy.  --has been Clip'd for TTS at [facility]. F/u arranged 10. BLE ischemia s/p Left TMA and R-BKA: NWB BLE and continue to use Shrinkers 11. Acute blood loss anemia: Improved and stable. Continue Aranesp 200 mcg/wk. Had 2 units PRBC on 5/8  -H/H most recently  7.1, transfuse if needed in HD  - have spoken about causes, rx above            -5/22 hgb up to 7.9       Latest Ref Rng & Units 04/13/2022    6:11 AM 04/11/2022    5:11 AM 04/06/2022   11:01 PM  CBC  WBC 4.0 - 10.5 K/uL 8.6   7.7   7.5    Hemoglobin 13.0 - 17.0 g/dL 7.9   7.4   7.1    Hematocrit 39.0 - 52.0 % 24.7   22.6   22.5    Platelets 150 - 400 K/uL 273   240   240       12.  Afib: Monitor HR TID--continue metoprolol every 8 hours. Rate controlled             --no AC due to low CHADVASC score.  13. Metabolic encephalopathy/delirium: Has resolved.   14. Diarrhea: appears improved --dc fiber -moving bowels 15. Gout: appears improved  -On allopurinol '100mg'$   -Prednisone weaned off              Continue 100 allopurinol daily  LOS: 12 days A FACE TO FACE EVALUATION WAS PERFORMED  Meredith Staggers 04/15/2022, 8:23 AM    s

## 2022-04-15 NOTE — Progress Notes (Signed)
[facility] NEPHROLOGY PROGRESS NOTE  Assessment/ Plan:  1)  Acute kidney Injury: ATN from severe septic shock in the setting of foot wounds. He has been on dialysis dependent since 02/26/2022 (initially started with CRRT and transition to IHD with first tx 4/12).  Vein mapping done.  They would really like to get him through rehab stay before doing an AVF - HD per TTS schedule. Going home today; he tolerated hd in a chair yesterday  We have clipped as AKI for now and watching for recovery, though may be end stage given prolonged dependence on dialysis.  He has an outpatient HD unit (as an AKI) at [facility] on TTS schedule -Status post HD on 5/20 with UF around 3 L, 2.5L 5/23; tolerated well.    - Strict I&O's -> won't check 24hr clearance unless UOP is adequate (at minimum >79m/24hr period)  2) Scrotal edema/wound: Status post surgical debridement by urology on 4/24 and on 5/6 underwent I&D of scrotum with wound closure and drain placement.    3)  Leg wound status post left TMA and right below-knee amputation on 03/15/2022: Septic shock resolved.    4)  Anemia - acute blood loss: s/p PRBC's multiple units. CT abd was negative for acute process.  Iron level acceptable and currently on Aranesp 200 mcg.  Monitor hemoglobin.  5)  CKD-MBD: On Fosrenol.  Monitor calcium, phosphorus -> p6.7 5/16, will check phos before he leaves  Subjective: Seen and examined in inpatient rehab.  No new event.  Denies nausea, vomiting, chest pain, shortness of breath.  Denies any cramping yest on hd, just rt shoulder pain (likely position related)  Objective Vital signs in last 24 hours: Vitals:   04/14/22 1900 04/14/22 2048 04/15/22 0500 04/15/22 0500  BP: 115/78 122/68  120/68  Pulse: 98 (!) 104  98  Resp: '18 17  18  '$ Temp: (!) 97.5 F (36.4 C) 98.8 F (37.1 C)  98.5 F (36.9 C)  TempSrc: Temporal Oral  Oral  SpO2:  99%  99%  Weight:   92 kg   Height:       Weight change:    Intake/Output Summary (Last 24 hours) at 04/15/2022 0825 Last data filed at 04/14/2022 1845 Gross per 24 hour  Intake --  Output 2500 ml  Net -2500 ml       Labs: Basic Metabolic Panel: Recent Labs  Lab 04/13/22 0611  NA 138  K 4.1  CL 103  CO2 23  GLUCOSE 108*  BUN 41*  CREATININE 6.40*  CALCIUM 9.2   Liver Function Tests: No results for input(s): AST, ALT, ALKPHOS, BILITOT, PROT, ALBUMIN in the last 168 hours.  No results for input(s): LIPASE, AMYLASE in the last 168 hours. No results for input(s): AMMONIA in the last 168 hours. CBC: Recent Labs  Lab 04/11/22 0511 04/13/22 0611  WBC 7.7 8.6  HGB 7.4* 7.9*  HCT 22.6* 24.7*  MCV 93.4 92.9  PLT 240 273   Cardiac Enzymes: No results for input(s): CKTOTAL, CKMB, CKMBINDEX, TROPONINI in the last 168 hours. CBG: No results for input(s): GLUCAP in the last 168 hours.   Iron Studies: No results for input(s): IRON, TIBC, TRANSFERRIN, FERRITIN in the last 72 hours. Studies/Results: No results found.  Medications: Infusions:    Scheduled Medications:  acetaminophen  650 mg Oral Q6H   allopurinol  100 mg Oral Daily   vitamin C  1,000 mg Oral Daily   aspirin  81 mg Oral Daily  Chlorhexidine Gluconate Cloth  6 each Topical BID   darbepoetin (ARANESP) injection - DIALYSIS  200 mcg Intravenous Q Tue-HD   folic acid  1 mg Oral Daily   guaiFENesin  400 mg Oral Q8H   lanthanum  1,000 mg Oral TID WC   leptospermum manuka honey  1 application. Topical Daily   melatonin  5 mg Oral QHS   metoprolol tartrate  37.5 mg Oral TID   multivitamin  1 tablet Oral QHS   pantoprazole  40 mg Oral Daily   QUEtiapine  25 mg Oral QHS    have reviewed scheduled and prn medications.  Physical Exam: General: Able to lie flat comfortable, not in distress. Heart:RRR, s1s2 nl Lungs: Clear b/l, no crackle Abdomen:soft, Non-tender, non-distended Extremities:No edema, left TMA and right BKA. Dialysis Access: Right IJ  TDC.  Herb Beltre W 04/15/2022,8:25 AM  LOS: 12 days

## 2022-04-15 NOTE — Progress Notes (Signed)
Discussed and reviewed dressing changes as ordered with wife and pt. Wife returned demonstration correctly. Pt/famly have no questions regarding dressing changes. Sheela Stack, LPN

## 2022-04-15 NOTE — Progress Notes (Signed)
HD cath dressing noted to be dated 04/07/22. Dressing noted to be loose and old drainage noted. Pt/family educated on importance of dressing changes and integrity of dressing. Dialysis charge nurse notified. Sheela Stack, LPN

## 2022-04-15 NOTE — Progress Notes (Signed)
Inpatient Rehabilitation Care Coordinator Discharge Note   Patient Details  Name: Jeffrey Ayala MRN: [id number] Date of Birth: 18-Nov-1961   Discharge location: D/c to home  Length of Stay: 11 days  Discharge activity level: wheelchair level [place] I using slide board for transfers  Home/community participation: Limited  Patient response CB:JSEGBT Literacy - How often do you need to have someone help you when you read instructions, pamphlets, or other written material from your doctor or pharmacy?: Never  Patient response DV:VOHYWV Isolation - How often do you feel lonely or isolated from those around you?: Never  Services provided included: MD, RD, PT, OT, SLP, RN, Pharmacy, Neuropsych, SW, TR, CM  Financial Services:  Charity fundraiser Utilized: [place] offered to/list presented to: Yes  Follow-up services arranged:  Outpatient, DME    Outpatient Servicies: [facility] PT for only 3 weeks to practice transfers and balance DME : [employer] for bariatric drop arm bedside commode (being shipped to home); [employer]- loaner specialty w/c    Patient response to transportation need: Is the patient able to respond to transportation needs?: Yes In the past 12 months, has lack of transportation kept you from medical appointments or from getting medications?: No In the past 12 months, has lack of transportation kept you from meetings, work, or from getting things needed for daily living?: No   Comments (or additional information):  Patient/Family verbalized understanding of follow-up arrangements:  Yes  Individual responsible for coordination of the follow-up plan: contact pt wife [name]  Confirmed correct DME delivered: Rana Snare 04/15/2022    Rana Snare

## 2022-04-15 NOTE — Progress Notes (Addendum)
Discharge instructions discussed per PA with pt and family. Pt/family in agreement. Questions answered appropriately. Belongings gathered. Pt left per wheelchair to private vehicle. No complications noted. HD cath dressing changed per dialysis. Sheela Stack, LPN

## 2022-04-16 ENCOUNTER — Telehealth: Payer: Self-pay | Admitting: Nephrology

## 2022-04-16 DIAGNOSIS — Z992 Dependence on renal dialysis: Secondary | ICD-10-CM | POA: Diagnosis not present

## 2022-04-16 DIAGNOSIS — N178 Other acute kidney failure: Secondary | ICD-10-CM | POA: Diagnosis not present

## 2022-04-16 DIAGNOSIS — N2581 Secondary hyperparathyroidism of renal origin: Secondary | ICD-10-CM | POA: Diagnosis not present

## 2022-04-16 NOTE — Telephone Encounter (Signed)
Transition of Care Contact from [place]   Date of Discharge: 04/15/22 Date of Contact: 04/16/22 Method of contact: phone Talked to patient   Patient contacted to discuss transition of care form recent hospitaliztion. Patient was admitted to [facility] from 04/03/22 to 04/15/22 with the discharge diagnosis of dialysis dependent AKI, leg wounds s/p R BKA and L TMA and scrotal edema/wound.     Medication changes were reviewed - reports low BP, advised to decrease metoprolol to 1/2 tablet TID.    Patient will follow up with is outpatient dialysis center 04/18/22.  Other follow up needs include none identified.   Jen Mow, PA-C [facility] Pager: [phone]

## 2022-04-18 ENCOUNTER — Other Ambulatory Visit: Payer: Self-pay

## 2022-04-18 ENCOUNTER — Encounter (HOSPITAL_COMMUNITY): Payer: Self-pay

## 2022-04-18 ENCOUNTER — Emergency Department (HOSPITAL_COMMUNITY): Payer: BC Managed Care – PPO

## 2022-04-18 ENCOUNTER — Observation Stay (HOSPITAL_COMMUNITY)
Admission: EM | Admit: 2022-04-18 | Discharge: 2022-04-20 | Disposition: A | Discharge status: Home / Self Care | Payer: BC Managed Care – PPO | Attending: Internal Medicine | Admitting: Internal Medicine

## 2022-04-18 DIAGNOSIS — G4489 Other headache syndrome: Secondary | ICD-10-CM | POA: Diagnosis not present

## 2022-04-18 DIAGNOSIS — I4891 Unspecified atrial fibrillation: Secondary | ICD-10-CM | POA: Diagnosis not present

## 2022-04-18 DIAGNOSIS — Z89432 Acquired absence of left foot: Secondary | ICD-10-CM

## 2022-04-18 DIAGNOSIS — D649 Anemia, unspecified: Secondary | ICD-10-CM | POA: Diagnosis not present

## 2022-04-18 DIAGNOSIS — Z7982 Long term (current) use of aspirin: Secondary | ICD-10-CM | POA: Insufficient documentation

## 2022-04-18 DIAGNOSIS — K7460 Unspecified cirrhosis of liver: Secondary | ICD-10-CM | POA: Diagnosis present

## 2022-04-18 DIAGNOSIS — I120 Hypertensive chronic kidney disease with stage 5 chronic kidney disease or end stage renal disease: Secondary | ICD-10-CM | POA: Insufficient documentation

## 2022-04-18 DIAGNOSIS — N186 End stage renal disease: Secondary | ICD-10-CM | POA: Diagnosis not present

## 2022-04-18 DIAGNOSIS — Z992 Dependence on renal dialysis: Secondary | ICD-10-CM | POA: Insufficient documentation

## 2022-04-18 DIAGNOSIS — Z89511 Acquired absence of right leg below knee: Secondary | ICD-10-CM

## 2022-04-18 DIAGNOSIS — I10 Essential (primary) hypertension: Secondary | ICD-10-CM | POA: Diagnosis not present

## 2022-04-18 DIAGNOSIS — Z79899 Other long term (current) drug therapy: Secondary | ICD-10-CM | POA: Insufficient documentation

## 2022-04-18 DIAGNOSIS — N185 Chronic kidney disease, stage 5: Secondary | ICD-10-CM | POA: Diagnosis not present

## 2022-04-18 DIAGNOSIS — R531 Weakness: Secondary | ICD-10-CM | POA: Diagnosis not present

## 2022-04-18 DIAGNOSIS — E876 Hypokalemia: Secondary | ICD-10-CM | POA: Diagnosis present

## 2022-04-18 DIAGNOSIS — R079 Chest pain, unspecified: Secondary | ICD-10-CM | POA: Diagnosis not present

## 2022-04-18 DIAGNOSIS — R519 Headache, unspecified: Secondary | ICD-10-CM

## 2022-04-18 DIAGNOSIS — N178 Other acute kidney failure: Secondary | ICD-10-CM | POA: Diagnosis not present

## 2022-04-18 DIAGNOSIS — R0602 Shortness of breath: Secondary | ICD-10-CM | POA: Diagnosis not present

## 2022-04-18 DIAGNOSIS — N492 Inflammatory disorders of scrotum: Secondary | ICD-10-CM | POA: Diagnosis present

## 2022-04-18 DIAGNOSIS — N2581 Secondary hyperparathyroidism of renal origin: Secondary | ICD-10-CM | POA: Diagnosis not present

## 2022-04-18 LAB — MAGNESIUM: Magnesium: 1.9 mg/dL (ref 1.7–2.4)

## 2022-04-18 LAB — CBC WITH DIFFERENTIAL/PLATELET
Abs Immature Granulocytes: 0.04 10*3/uL (ref 0.00–0.07)
Basophils Absolute: 0 10*3/uL (ref 0.0–0.1)
Basophils Relative: 0 %
Eosinophils Absolute: 0 10*3/uL (ref 0.0–0.5)
Eosinophils Relative: 0 %
HCT: 23 % — ABNORMAL LOW (ref 39.0–52.0)
Hemoglobin: 7.6 g/dL — ABNORMAL LOW (ref 13.0–17.0)
Immature Granulocytes: 0 %
Lymphocytes Relative: 13 %
Lymphs Abs: 1.3 10*3/uL (ref 0.7–4.0)
MCH: 30.9 pg (ref 26.0–34.0)
MCHC: 33 g/dL (ref 30.0–36.0)
MCV: 93.5 fL (ref 80.0–100.0)
Monocytes Absolute: 0.8 10*3/uL (ref 0.1–1.0)
Monocytes Relative: 9 %
Neutro Abs: 7.2 10*3/uL (ref 1.7–7.7)
Neutrophils Relative %: 78 %
Platelets: 246 10*3/uL (ref 150–400)
RBC: 2.46 MIL/uL — ABNORMAL LOW (ref 4.22–5.81)
RDW: 20.5 % — ABNORMAL HIGH (ref 11.5–15.5)
WBC: 9.4 10*3/uL (ref 4.0–10.5)
nRBC: 0 % (ref 0.0–0.2)

## 2022-04-18 LAB — APTT: aPTT: 36 seconds (ref 24–36)

## 2022-04-18 LAB — COMPREHENSIVE METABOLIC PANEL
ALT: 27 U/L (ref 0–44)
AST: 22 U/L (ref 15–41)
Albumin: 3.6 g/dL (ref 3.5–5.0)
Alkaline Phosphatase: 98 U/L (ref 38–126)
Anion gap: 11 (ref 5–15)
BUN: 28 mg/dL — ABNORMAL HIGH (ref 8–23)
CO2: 28 mmol/L (ref 22–32)
Calcium: 8.8 mg/dL — ABNORMAL LOW (ref 8.9–10.3)
Chloride: 99 mmol/L (ref 98–111)
Creatinine, Ser: 4.76 mg/dL — ABNORMAL HIGH (ref 0.61–1.24)
GFR, Estimated: 13 mL/min — ABNORMAL LOW (ref >60)
Glucose, Bld: 108 mg/dL — ABNORMAL HIGH (ref 70–99)
Potassium: 3.1 mmol/L — ABNORMAL LOW (ref 3.5–5.1)
Sodium: 138 mmol/L (ref 135–145)
Total Bilirubin: 0.9 mg/dL (ref 0.3–1.2)
Total Protein: 7.6 g/dL (ref 6.5–8.1)

## 2022-04-18 LAB — HEPARIN LEVEL (UNFRACTIONATED): Heparin Unfractionated: <0.1 IU/mL — ABNORMAL LOW (ref 0.30–0.70)

## 2022-04-18 MED ORDER — LANTHANUM CARBONATE 500 MG PO CHEW
1000.0000 mg | CHEWABLE_TABLET | Freq: Three times a day (TID) | ORAL | Status: DC
  Administered 2022-04-19 – 2022-04-20 (×5): 1000 mg via ORAL
  Filled 2022-04-18 (×11): qty 2

## 2022-04-18 MED ORDER — METOPROLOL TARTRATE 5 MG/5ML IV SOLN: 5.0000 mg | INTRAVENOUS | Status: DC | PRN

## 2022-04-18 MED ORDER — HEPARIN (PORCINE) 25000 UT/250ML-% IV SOLN
1800.0000 [IU]/h | INTRAVENOUS | Status: DC
  Administered 2022-04-18: 1200 [IU]/h via INTRAVENOUS
  Administered 2022-04-19: 1500 [IU]/h via INTRAVENOUS
  Administered 2022-04-20: 1800 [IU]/h via INTRAVENOUS
  Filled 2022-04-18 (×3): qty 250

## 2022-04-18 MED ORDER — ALLOPURINOL 100 MG PO TABS
100.0000 mg | ORAL_TABLET | Freq: Every day | ORAL | Status: DC
  Administered 2022-04-18 – 2022-04-20 (×3): 100 mg via ORAL
  Filled 2022-04-18 (×3): qty 1

## 2022-04-18 MED ORDER — METOPROLOL TARTRATE 5 MG/5ML IV SOLN
5.0000 mg | Freq: Once | INTRAVENOUS | Status: AC
  Administered 2022-04-18: 5 mg via INTRAVENOUS
  Filled 2022-04-18: qty 5

## 2022-04-18 MED ORDER — QUETIAPINE FUMARATE 25 MG PO TABS
12.5000 mg | ORAL_TABLET | Freq: Every day | ORAL | Status: DC
Start: 2022-04-18 — End: 2022-04-20
  Administered 2022-04-18 – 2022-04-19 (×2): 12.5 mg via ORAL
  Filled 2022-04-18 (×2): qty 1

## 2022-04-18 MED ORDER — ACETAMINOPHEN 325 MG PO TABS
650.0000 mg | ORAL_TABLET | Freq: Four times a day (QID) | ORAL | Status: DC | PRN
Start: 2022-04-18 — End: 2022-04-20

## 2022-04-18 MED ORDER — DIPHENHYDRAMINE HCL 50 MG/ML IJ SOLN
25.0000 mg | Freq: Once | INTRAMUSCULAR | Status: AC
  Administered 2022-04-18: 25 mg via INTRAVENOUS
  Filled 2022-04-18: qty 1

## 2022-04-18 MED ORDER — FENTANYL CITRATE PF 50 MCG/ML IJ SOSY
50.0000 ug | PREFILLED_SYRINGE | Freq: Once | INTRAMUSCULAR | Status: AC
  Administered 2022-04-18: 50 ug via INTRAVENOUS
  Filled 2022-04-18: qty 1

## 2022-04-18 MED ORDER — ENSURE ENLIVE PO LIQD: 237.0000 mL | Freq: Two times a day (BID) | ORAL | Status: DC

## 2022-04-18 MED ORDER — ONDANSETRON HCL 4 MG/2ML IJ SOLN
4.0000 mg | Freq: Four times a day (QID) | INTRAMUSCULAR | Status: DC | PRN
Start: 2022-04-18 — End: 2022-04-20

## 2022-04-18 MED ORDER — METOPROLOL TARTRATE 25 MG PO TABS
25.0000 mg | ORAL_TABLET | Freq: Two times a day (BID) | ORAL | Status: DC
  Administered 2022-04-19 – 2022-04-20 (×3): 25 mg via ORAL
  Filled 2022-04-18 (×4): qty 1

## 2022-04-18 MED ORDER — PANTOPRAZOLE SODIUM 40 MG PO TBEC
40.0000 mg | DELAYED_RELEASE_TABLET | Freq: Every day | ORAL | Status: DC
  Administered 2022-04-18 – 2022-04-20 (×3): 40 mg via ORAL
  Filled 2022-04-18 (×3): qty 1

## 2022-04-18 MED ORDER — POTASSIUM CHLORIDE CRYS ER 20 MEQ PO TBCR
40.0000 meq | EXTENDED_RELEASE_TABLET | Freq: Once | ORAL | Status: AC
  Administered 2022-04-18: 40 meq via ORAL
  Filled 2022-04-18: qty 2

## 2022-04-18 MED ORDER — HEPARIN BOLUS VIA INFUSION
4000.0000 [IU] | Freq: Once | INTRAVENOUS | Status: AC
  Administered 2022-04-18: 4000 [IU] via INTRAVENOUS

## 2022-04-18 MED ORDER — ONDANSETRON HCL 4 MG PO TABS
4.0000 mg | ORAL_TABLET | Freq: Four times a day (QID) | ORAL | Status: DC | PRN
Start: 2022-04-18 — End: 2022-04-20

## 2022-04-18 MED ORDER — OXYCODONE HCL 5 MG PO TABS: 5.0000 mg | ORAL_TABLET | Freq: Every day | ORAL | Status: DC | PRN

## 2022-04-18 MED ORDER — POLYETHYLENE GLYCOL 3350 17 G PO PACK: 17.0000 g | PACK | Freq: Every day | ORAL | Status: DC | PRN

## 2022-04-18 MED ORDER — METOCLOPRAMIDE HCL 5 MG/ML IJ SOLN
10.0000 mg | Freq: Once | INTRAMUSCULAR | Status: AC
  Administered 2022-04-18: 10 mg via INTRAVENOUS
  Filled 2022-04-18: qty 2

## 2022-04-18 MED ORDER — METOPROLOL TARTRATE 50 MG PO TABS
50.0000 mg | ORAL_TABLET | Freq: Once | ORAL | Status: AC
  Administered 2022-04-18: 50 mg via ORAL
  Filled 2022-04-18: qty 1

## 2022-04-18 MED ORDER — ACETAMINOPHEN 650 MG RE SUPP: 650.0000 mg | Freq: Four times a day (QID) | RECTAL | Status: DC | PRN

## 2022-04-18 NOTE — ED Provider Notes (Signed)
[facility] EMERGENCY DEPARTMENT Provider Note   CSN: [id number] Arrival date & time: 04/18/22  1424     History  Chief Complaint  Patient presents with   Vascular Access Problem    Jeffrey Ayala is a 61 y.o. male.  HPI  This patient is an unfortunate 61 year old male who recently was admitted to the hospital with what was thought to be sepsis from an unknown source, had been admitted to the hospital on April 2, while he was in the hospital he became septic went into shock and required vasopressor therapy, ultimately he had gangrene of his scrotum, bilateral amputations of his legs, his right side is below-the-knee and left side is partial foot, he was in atrial fibrillation in the hospital and started on beta-blocker, was to be taking metoprolol 37.5 mg by mouth 3 times a day and only if blood pressure was above 120 mmHg.  He started dialysis while he was in the hospital due to end-stage renal disease, he was placed on a baby aspirin but no other anticoagulants.  This was held evidently due to the risk of bleeding after recent amputations.  The patient was discharged from the hospital on 24 May, today on 27 May he went to dialysis, according to the dialysis nurse who I spoke with he had some difficulty with the lines, there was thought to be some clot in the lines that was slowing down the flow, the lines were changed and when he was restarted he developed a severe headache which she was rated at 9 out of 10.  Because of the severe pain in the head the patient was ultimately stopped on the dialysis and sent to the emergency department.  The nurse who I spoke with stated that they did not see any neurologic abnormalities but after discussion with the nurse practitioner who is on-call at [facility] for the dialysis group, Juanell Fairly the recommendation was made to send the patient to the emergency department for evaluation.  Home Medications Prior to Admission medications   Medication Sig  Start Date End Date Taking? Authorizing Provider  acetaminophen (TYLENOL) 650 MG CR tablet Take 650 mg by mouth every 8 (eight) hours as needed for pain or fever.    [provider]  allopurinol (ZYLOPRIM) 100 MG tablet Take 1 tablet (100 mg total) by mouth daily. 04/14/22   Angiulli, Lavon Paganini, PA-C  ascorbic acid (VITAMIN C) 1000 MG tablet Take 1 tablet (1,000 mg total) by mouth daily. 04/14/22   Angiulli, Lavon Paganini, PA-C  aspirin 81 MG chewable tablet Chew 1 tablet (81 mg total) by mouth daily. 04/03/22   Caren Griffins, MD  folic acid (FOLVITE) 1 MG tablet Take 1 tablet (1 mg total) by mouth daily. 04/14/22   Angiulli, Lavon Paganini, PA-C  hydrOXYzine (ATARAX) 10 MG/5ML syrup Take 12.5 mLs (25 mg total) by mouth 2 (two) times daily as needed for itching. 04/14/22   Angiulli, Lavon Paganini, PA-C  lanthanum (FOSRENOL) 1000 MG chewable tablet Chew 1 tablet (1,000 mg total) by mouth 3 (three) times daily with meals. 04/14/22   Angiulli, Lavon Paganini, PA-C  leptospermum manuka honey (MEDIHONEY) PSTE paste Apply 1 application. topically daily. To elbow wound, cover with damp to dry dressing. Change daily. 04/14/22   Angiulli, Lavon Paganini, PA-C  melatonin 5 MG TABS Take 1 tablet (5 mg total) by mouth at bedtime. 04/14/22   Angiulli, Lavon Paganini, PA-C  Metoprolol Tartrate 37.5 MG TABS Take 37.5 mg by mouth 3 (  three) times daily. 04/14/22   Angiulli, Lavon Paganini, PA-C  multivitamin (RENA-VIT) TABS tablet Take 1 tablet by mouth at bedtime. 04/14/22   Angiulli, Lavon Paganini, PA-C  oxyCODONE (OXY IR/ROXICODONE) 5 MG immediate release tablet Take 1 tablet (5 mg total) by mouth daily as needed for severe pain. 04/14/22   Angiulli, Lavon Paganini, PA-C  pantoprazole (PROTONIX) 40 MG tablet Take 1 tablet (40 mg total) by mouth daily. 04/14/22   Angiulli, Lavon Paganini, PA-C  QUEtiapine (SEROQUEL) 25 MG tablet Take 1 tablet (25 mg total) by mouth at bedtime. 04/14/22   Angiulli, Lavon Paganini, PA-C  simethicone (MYLICON) 80 MG chewable tablet Chew 1 tablet  (80 mg total) by mouth 4 (four) times daily as needed for flatulence. 04/14/22   Angiulli, Lavon Paganini, PA-C  traZODone (DESYREL) 50 MG tablet Take 0.5-1 tablets (25-50 mg total) by mouth at bedtime as needed for sleep. 04/14/22   Angiulli, Lavon Paganini, PA-C      Allergies    Codeine and Flomax [tamsulosin hcl]    Review of Systems   Review of Systems  All other systems reviewed and are negative.  Physical Exam Updated Vital Signs BP 140/76   Pulse (!) 102   Temp 98.5 F (36.9 C) (Oral)   Resp (!) 22   Ht 1.778 m ('5\' 10"'$ )   Wt 86.2 kg   SpO2 100%   BMI 27.26 kg/m  Physical Exam Vitals and nursing note reviewed.  Constitutional:      General: He is not in acute distress.    Appearance: He is well-developed.     Comments: Uncomfortable appearing  HENT:     Head: Normocephalic and atraumatic.     Mouth/Throat:     Pharynx: No oropharyngeal exudate.  Eyes:     General: No scleral icterus.       Right eye: No discharge.        Left eye: No discharge.     Conjunctiva/sclera: Conjunctivae normal.     Pupils: Pupils are equal, round, and reactive to light.  Neck:     Thyroid: No thyromegaly.     Vascular: No JVD.  Cardiovascular:     Rate and Rhythm: Tachycardia present. Rhythm irregular.     Heart sounds: Normal heart sounds. No murmur heard.   No friction rub. No gallop.     Comments: Atrial fibrillation with rapid ventricular rate ranging between 110 and 130 bpm Pulmonary:     Effort: Pulmonary effort is normal. No respiratory distress.     Breath sounds: Normal breath sounds. No wheezing or rales.  Abdominal:     General: Bowel sounds are normal. There is no distension.     Palpations: Abdomen is soft. There is no mass.     Tenderness: There is no abdominal tenderness.  Musculoskeletal:        General: No tenderness. Normal range of motion.     Cervical back: Normal range of motion and neck supple.     Right lower leg: No edema.     Left lower leg: No edema.      Comments: I have taken down the stocking on the stump as well as the foot, there does not appear to be any dehiscence, redness, swelling, foul-smelling drainage or any other signs of infection, they are nontender  Lymphadenopathy:     Cervical: No cervical adenopathy.  Skin:    General: Skin is warm and dry.     Findings: No erythema or rash.  Neurological:  Mental Status: He is alert.     Coordination: Coordination normal.     Comments: The patient is sleepy but arousable, answers questions appropriately, endorses having a headache but has no focal neurodeficits and able to move all 4 extremities  Psychiatric:        Behavior: Behavior normal.    ED Results / Procedures / Treatments   Labs (all labs ordered are listed, but only abnormal results are displayed) Labs Reviewed  CBC WITH DIFFERENTIAL/PLATELET - Abnormal; Notable for the following components:      Result Value   RBC 2.46 (*)    Hemoglobin 7.6 (*)    HCT 23.0 (*)    RDW 20.5 (*)    All other components within normal limits  COMPREHENSIVE METABOLIC PANEL    EKG EKG Interpretation  Date/Time:  Saturday Apr 18 2022 14:42:48 EDT Ventricular Rate:  125 PR Interval:    QRS Duration: 90 QT Interval:  298 QTC Calculation: 430 R Axis:   -9 Text Interpretation: Atrial fibrillation Ventricular premature complex Inferior infarct, old Lateral leads are also involved Baseline wander in lead(s) V2 Confirmed by Noemi Chapel [phone]) on 04/18/2022 3:43:02 PM  Radiology CT Head Wo Contrast  Result Date: 04/18/2022 CLINICAL DATA:  Headache, new or worsening. EXAM: CT HEAD WITHOUT CONTRAST TECHNIQUE: Contiguous axial images were obtained from the base of the skull through the vertex without intravenous contrast. RADIATION DOSE REDUCTION: This exam was performed according to the departmental dose-optimization program which includes automated exposure control, adjustment of the mA and/or kV according to patient size and/or use of  iterative reconstruction technique. COMPARISON:  CT examination dated March 07, 2022 FINDINGS: Brain: No evidence of acute infarction, hemorrhage, hydrocephalus, extra-axial collection or mass lesion/mass effect. Vascular: No hyperdense vessel or unexpected calcification. Skull: Normal. Negative for fracture or focal lesion. Sinuses/Orbits: No acute finding. Other: None. IMPRESSION: No acute intracranial abnormality. Electronically Signed   By: Keane Police D.O.   On: 04/18/2022 15:17   DG Chest Port 1 View  Result Date: 04/18/2022 CLINICAL DATA:  Weakness, short of breath, chest pain EXAM: PORTABLE CHEST 1 VIEW COMPARISON:  03/10/2022 FINDINGS: 2 frontal views of the chest demonstrates stable right internal jugular dialysis catheter. The cardiac silhouette is unremarkable. No airspace disease, effusion, or pneumothorax. There are no acute bony abnormalities. IMPRESSION: 1. Stable chest, no acute process. Electronically Signed   By: Randa Ngo M.D.   On: 04/18/2022 15:14    Procedures .Critical Care Performed by: Noemi Chapel, MD Authorized by: Noemi Chapel, MD   Critical care provider statement:    Critical care time (minutes):  45   Critical care time was exclusive of:  Separately billable procedures and treating other patients and teaching time   Critical care was necessary to treat or prevent imminent or life-threatening deterioration of the following conditions:  Cardiac failure   Critical care was time spent personally by me on the following activities:  Development of treatment plan with patient or surrogate, discussions with consultants, evaluation of patient's response to treatment, examination of patient, ordering and review of laboratory studies, ordering and review of radiographic studies, ordering and performing treatments and interventions, pulse oximetry, re-evaluation of patient's condition, review of old charts and obtaining history from patient or surrogate   I assumed  direction of critical care for this patient from another provider in my specialty: no     Care discussed with: admitting provider   Comments:  Medications Ordered in ED Medications  fentaNYL (SUBLIMAZE) injection 50 mcg (has no administration in time range)  metoCLOPramide (REGLAN) injection 10 mg (has no administration in time range)  diphenhydrAMINE (BENADRYL) injection 25 mg (has no administration in time range)  metoprolol tartrate (LOPRESSOR) injection 5 mg (5 mg Intravenous Given 04/18/22 1502)    ED Course/ Medical Decision Making/ A&P                           Medical Decision Making Amount and/or Complexity of Data Reviewed Labs: ordered. Radiology: ordered.  Risk Prescription drug management.   This patient presents to the ED for concern of headache as well as complaints of atrial fibrillation with rapid ventricular rate and incomplete dialysis session., this involves an extensive number of treatment options, and is a complaint that carries with it a high risk of complications and morbidity.  The differential diagnosis includes recurrent infection, air embolus, stroke, hemorrhage, ongoing A-fib with poor rate control   Co morbidities that complicate the patient evaluation  End-stage renal disease with dialysis New onset and ongoing atrial fibrillation within the last couple months Recent severe sepsis and shock   Additional history obtained:  Additional history obtained from electronic medical record External records from outside source obtained and reviewed including prior operative notes from urology, orthopedics as well as the extensive admission history and progress notes from the hospitalist regarding the patient's chronic conditions and extended hospitalization   Lab Tests:  I Ordered, and personally interpreted labs.  The pertinent results include: CBC metabolic panel   Imaging Studies ordered:  I ordered imaging studies including CT scan of  the brain and a portable chest x-ray I independently visualized and interpreted imaging which showed no acute intracranial abnormalities, no signs of pneumonia or pulmonary abnormalities either I agree with the radiologist interpretation   Cardiac Monitoring: / EKG:  The patient was maintained on a cardiac monitor.  I personally viewed and interpreted the cardiac monitored which showed an underlying rhythm of: Atrial fibrillation with rapid ventricular rate   Consultations Obtained:  I requested consultation with the nephrologist [name],  and discussed lab and imaging findings as well as pertinent plan - they recommend: A CT scan without contrast to further evaluate the cause of the headache as an air embolus would show up on this study. Patient may need to be discussed with the hospitalist for admission if rate control not obtained Will discussed with cardiology due to the patient's A-fib, Dr. Johney Frame agrees with anticoagulation as long as there is not a severe or worsening anemia [name] for admission   Problem List / ED Course / Critical interventions / Medication management  The patient is in atrial fibrillation with rapid ventricular rate requiring medications I ordered medication including metoprolol and heparin for uncontrolled atrial fibrillation Reevaluation of the patient after these medicines showed that the patient improved I have reviewed the patients home medicines and have made adjustments as needed   Social Determinants of Health:  Bilateral amputations, recent prolonged hospitalization   Test / Admission - Considered:  Will be admitted to the hospital, I discussed with [name] of the hospitalist service who has been kind enough to admit this very sick patient to hospital         Final Clinical Impression(s) / ED Diagnoses Final diagnoses:  Atrial fibrillation with rapid ventricular response ([place])  ESRD (end stage renal disease) ([place])   Anemia, unspecified type  Primary hypertension  Acute nonintractable headache, unspecified headache type     Noemi Chapel, MD 04/18/22 1545

## 2022-04-18 NOTE — ED Triage Notes (Addendum)
[place] (diaylsis/[place]) receiving diaylsis for 2 hours and catheter became clogged. Staff was able to unclog catheter but pt began having a very strong headache. Port at right chest.

## 2022-04-18 NOTE — Progress Notes (Signed)
ANTICOAGULATION CONSULT NOTE - Initial Consult  Pharmacy Consult for Heparin Indication: atrial fibrillation  Allergies  Allergen Reactions   Codeine Hives    Insomnia, jittery   Flomax [Tamsulosin Hcl] Nausea Only    Nausea and fatigue    Patient Measurements: Height: '5\' 10"'$  (177.8 cm) Weight: 86.2 kg (190 lb) IBW/kg (Calculated) : 73 HEPARIN DW (KG): 86.2   Vital Signs: Temp: 98.5 F (36.9 C) (05/27 1438) Temp Source: Oral (05/27 1438) BP: 140/76 (05/27 1439) Pulse Rate: 102 (05/27 1500)  Labs: Recent Labs    04/18/22 1446  HGB 7.6*  HCT 23.0*  PLT 246  CREATININE 4.76*    Estimated Creatinine Clearance: 16.8 mL/min (A) (by C-G formula based on SCr of 4.76 mg/dL (H)).   Medical History: Past Medical History:  Diagnosis Date   Concussion    Eosinophilic esophagitis    EGD/bx by Dr. Havery Moros   Gastric ulcer    History of kidney stones    Intermittent dysphagia    Migraine     Medications:  See med rec  Assessment: 61 yo  patient presents to the ED for concern of headache as well as  atrial fibrillation with rapid ventricular rate and incomplete dialysis session. Patient not on oral anticoagulation.  Pharmacy asked to start heparin  Goal of Therapy:  Heparin level 0.3-0.7 units/ml Monitor platelets by anticoagulation protocol: Yes   Plan:  Give 4000 units bolus x 1 Start heparin infusion at 1200 units/hr Check anti-Xa level in ~6-8 hours and daily while on heparin Continue to monitor H&H and platelets  Isac Sarna, BS Pharm D, BCPS Clinical Pharmacist 04/18/2022,4:04 PM

## 2022-04-18 NOTE — Assessment & Plan Note (Addendum)
Stable. - metoprolol '50mg'$  x 1 given continue with '25mg'$  BID

## 2022-04-18 NOTE — Assessment & Plan Note (Signed)
Scrotal infection with septic shock requiring pressor, requiring surgical debridement.Culture data showing MDR pseudomonas, enterococcus faecalis, bacteroides ovalis, beta lactamase positive.  Completed course of antibiotics.  He is to follow-up with infectious disease 6/5.  He has had persistent drainage from his surgical sites since surgery, no erythema or signs of infection on scrotal area.  Urology note 5/23, plan is to allow for continued drainage, plan to follow-up in 2 weeks.

## 2022-04-18 NOTE — Assessment & Plan Note (Addendum)
New diagnosis during recent hospitalization. CHADS2VASC score- 1- 2. For HTN hx, and If adding PAD for recent bilat amputations.  He was to start metoprolol 37.5 BID on discharge, but dose was decreased to half by nephrology after reports of low blood pressure- systolic 20E. He had gotten only one dose of '18mg'$  metoprolol since discharge.  Anticoagulation was initially started during epithelization with heparin, but this was discontinued due to anemia.  Patient required 9 units of blood.  Hemoglobin today 7.6, but has ranged from 7-8 over the past 2 weeks. -Recent echo 02/23/2022, EF of 60 to 65%. -Continue heparin drip - Metoprolol '50mg'$  x 1 given, will continue at '25mg'$  BID with prior reports of hypotension -IV metoprolol '5mg'$  q110mn x 2 -Correct K -Hold aspirin

## 2022-04-18 NOTE — Assessment & Plan Note (Addendum)
Started recent hospitalization for septic shock.  Has been dialysis dependent since 02/26/2022, initially started on CRRT and transition to IHD.  Schedule Tuesday Thursday Saturday.  Completed 2 1/2 hours of HD today.  HD terminated today after reports of headache- head CT unremarkable.  Reports he still makes 400-500 mils of urine daily.  Chest x-ray clear, no sign of volume overload.  -Please consult nephrology in the morning.

## 2022-04-18 NOTE — Assessment & Plan Note (Addendum)
Potassium 3.1 .-  Replete -Check magnesium

## 2022-04-18 NOTE — Assessment & Plan Note (Signed)
Hemoglobin 7.6 today, baseline over the past 2 weeks 7-8.  Required 9 units PRBC during recent hospitalization. -Monitor while on heparin drip.

## 2022-04-18 NOTE — H&P (Addendum)
History and Physical    Jeffrey Ayala EXB:[id number] DOB: 1961/09/21 [name]: 04/18/2022  PCP: Jerline Pain, MD   Patient coming from: Home  I have personally briefly reviewed patient's old medical records in [place]  Chief Complaint: HD problem  HPI: Jeffrey Ayala is a 61 y.o. male with medical history significant for HTN, Cirrhosis, cardiac arrest , Right BKA and transmetatarsal amputation or left foot, scrotal infection, atria fib. Most of these diagnosis were new diagnosis from recent prolonged and complicated hospitalization.  Patient was recently hospitalized 4/2 to 04/03/2022 and subsequently discharged to inpatient rehab.  Patient initially was admitted with septic shock secondary to scrotal infection, despite resuscitation he developed hypotension requiring vasopressors, was intubated 4/3 and had a brief cardiac arrest postintubation.  Cardiology was consulted during hospitalization, echo was normal and no further testing was required.  To correct vasoplegia, he was started on methylene blue infusion.  Due to septic shock, patient renal function worsened and he was started on CRRT.  Patient developed gangrene of the left and right foot undergoing underwent left transmetatarsal amputation and right BKA by [name].  Underwent scrotal debridement 4/24, 5/6 and required drains, and readmission to ICU. Culture data- showed MDR pseudomonas, enterococcus faecalis, bacteroides ovalis, beta lactamase positive. E faecalis and MDR Pseudomonas aeruginosa.  He was to continue on antibiotics till 5/13 per ID.  He required up to 9 units of blood during hospitalization.  Other problems during hospitalization included thrombocytopenia requiring 1 unit of platelets, elevated troponin secondary to demand ischemia, and shock liver with normalization of LFTs, atrial fibrillation with RVR-initially started on heparin drip but discontinued secondary to anemia.  Discharged on metoprolol and aspirin.  He  was to continue dialysis on discharge, he was discharged to [facility] 5/12.  Patient was just discharged home from CIR- 5/24.   My evaluation, patient is drowsy status post Benadryl and fentanyl, but responds to questions appropriately.  Since discharge patient has been doing well.  Patient denies palpitations, no chest pain or difficulty breathing.  He has had persistent discharge from his scrotum since discharge, per spouse, these are slightly bloody, no pus, no redness to scrotal area.  Patient went for dialysis today, there was some problems with the lines and there was thought that there was a clots in the lines, so these were changed, patient subsequently started dialysis.  And then developed severe headache which patient rated as a 9 out of 10.  Dialysis was stopped.  There was a concern for possible air embolus causing headaches, was subsequently referred to the ED.  On arrival to the ED headache had started to subside. Patient has had multiple sessions of dialysis, since initiation of HD in the hospital, apart from exhaustion he has tolerated them well.  He has not had prior headaches.  Patient had completed 2 and a 1/2 hours of dialysis today before it was discontinued.  He makes 400-500 mils of urine daily.  ED Course: Temp 98.5.  Heart rate 112 - 108.  Respiratory rate 13-26.  Blood pressure 110-140.  O2 sats greater than 95% on room air. Potassium 3.1. Hgb 7.6.  Chest x-ray clear.  Head CT without acute abnormality. IV metoprolol 5 mg given in ED with improvement in heart rate. EDP talked in nephrology- [name], recommended head CT to further evaluate headache. EDP talked to cardiology recommended starting anticoagulation as anemia is not severe or worsening. IV heparin started.  Review of Systems: As per HPI all  other systems reviewed and negative.  Past Medical History:  Diagnosis Date   Concussion    Eosinophilic esophagitis    EGD/bx by Dr. Havery Moros   Gastric ulcer    History of  kidney stones    Intermittent dysphagia    Migraine     Past Surgical History:  Procedure Laterality Date   AMPUTATION Right 03/15/2022   Procedure: RIGHT BELOW KNEE AMPUTATION;  Surgeon: [name], MD;  Location: [place];  Service: Orthopedics;  Laterality: Right;   CYSTOSCOPY     DENTAL SURGERY     FINGER SURGERY     HERNIA REPAIR     at age 53   IR FLUORO GUIDE CV LINE RIGHT  03/09/2022   IR PATIENT EVAL TECH 0-60 MINS  03/09/2022   IR US GUIDE VASC ACCESS RIGHT  03/09/2022   KNEE ARTHROSCOPY Left    LITHOTRIPSY     NEPHROLITHOTOMY     SCROTAL EXPLORATION N/A 03/16/2022   Procedure: IRRIGATION AND DEBRIDEMENT SCROTUM;  Surgeon: Raynelle Bring, MD;  Location: [place];  Service: Urology;  Laterality: N/A;   SCROTAL EXPLORATION N/A 03/28/2022   Procedure: IRRIGATION AND DEBRIDEMENT SCROTUM;  Surgeon: Ardis Hughs, MD;  Location: [place];  Service: Urology;  Laterality: N/A;   TONSILLECTOMY     TOTAL KNEE ARTHROPLASTY Bilateral 11/04/2018   Procedure: BILATERAL TOTAL KNEE ARTHROPLASTY;  Surgeon: [name], MD;  Location: [place];  Service: Orthopedics;  Laterality: Bilateral;  spinal/epidural per anesthesiologist   TRANSMETATARSAL AMPUTATION Left 03/15/2022   Procedure: LEFT TRANSMETATARSAL AMPUTATION;  Surgeon: [name], MD;  Location: [place];  Service: Orthopedics;  Laterality: Left;     reports that he has never smoked. He has never used smokeless tobacco. He reports that he does not currently use alcohol. He reports that he does not use drugs.  Allergies  Allergen Reactions   Codeine Hives    Insomnia, jittery   Flomax [Tamsulosin Hcl] Nausea Only    Nausea and fatigue    Family History  Problem Relation Age of Onset   COPD Mother    Sarcoidosis Brother    Cancer Brother    Stomach cancer Neg Hx    Pancreatic cancer Neg Hx    Esophageal cancer Neg Hx    Colon cancer Neg Hx    Rectal cancer Neg Hx    Prior to Admission medications   Medication Sig Start  Date End Date Taking? Authorizing Provider  acetaminophen (TYLENOL) 650 MG CR tablet Take 650 mg by mouth every 8 (eight) hours as needed for pain or fever.    [provider]  allopurinol (ZYLOPRIM) 100 MG tablet Take 1 tablet (100 mg total) by mouth daily. 04/14/22   Angiulli, Lavon Paganini, PA-C  ascorbic acid (VITAMIN C) 1000 MG tablet Take 1 tablet (1,000 mg total) by mouth daily. 04/14/22   Angiulli, Lavon Paganini, PA-C  aspirin 81 MG chewable tablet Chew 1 tablet (81 mg total) by mouth daily. 04/03/22   Caren Griffins, MD  folic acid (FOLVITE) 1 MG tablet Take 1 tablet (1 mg total) by mouth daily. 04/14/22   Angiulli, Lavon Paganini, PA-C  hydrOXYzine (ATARAX) 10 MG/5ML syrup Take 12.5 mLs (25 mg total) by mouth 2 (two) times daily as needed for itching. 04/14/22   Angiulli, Lavon Paganini, PA-C  lanthanum (FOSRENOL) 1000 MG chewable tablet Chew 1 tablet (1,000 mg total) by mouth 3 (three) times daily with meals. 04/14/22   Angiulli, Lavon Paganini, PA-C  leptospermum manuka honey (MEDIHONEY) PSTE paste Apply 1 application. topically daily. To elbow wound, cover with damp to dry dressing. Change daily. 04/14/22   Angiulli, Lavon Paganini, PA-C  melatonin 5 MG TABS Take 1 tablet (5 mg total) by mouth at bedtime. 04/14/22   Angiulli, Lavon Paganini, PA-C  Metoprolol Tartrate 37.5 MG TABS Take 37.5 mg by mouth 3 (three) times daily. 04/14/22   Angiulli, Lavon Paganini, PA-C  multivitamin (RENA-VIT) TABS tablet Take 1 tablet by mouth at bedtime. 04/14/22   Angiulli, Lavon Paganini, PA-C  oxyCODONE (OXY IR/ROXICODONE) 5 MG immediate release tablet Take 1 tablet (5 mg total) by mouth daily as needed for severe pain. 04/14/22   Angiulli, Lavon Paganini, PA-C  pantoprazole (PROTONIX) 40 MG tablet Take 1 tablet (40 mg total) by mouth daily. 04/14/22   Angiulli, Lavon Paganini, PA-C  QUEtiapine (SEROQUEL) 25 MG tablet Take 1 tablet (25 mg total) by mouth at bedtime. 04/14/22   Angiulli, Lavon Paganini, PA-C  simethicone (MYLICON) 80 MG chewable tablet Chew 1 tablet (80 mg  total) by mouth 4 (four) times daily as needed for flatulence. 04/14/22   Angiulli, Lavon Paganini, PA-C  traZODone (DESYREL) 50 MG tablet Take 0.5-1 tablets (25-50 mg total) by mouth at bedtime as needed for sleep. 04/14/22   Cathlyn Parsons, PA-C    Physical Exam: Vitals:   04/18/22 1439 04/18/22 1440 04/18/22 1447 04/18/22 1500  BP: 140/76     Pulse: (!) 117  (!) 128 (!) 102  Resp: (!) 24  18 (!) 22  Temp:      TempSrc:      SpO2: 99%  98% 100%  Weight:  86.2 kg    Height:  '5\' 10"'$  (1.778 m)      Constitutional: Drowsy s/p Benadryl, but appropriately responsive to questions and directions Vitals:   04/18/22 1439 04/18/22 1440 04/18/22 1447 04/18/22 1500  BP: 140/76     Pulse: (!) 117  (!) 128 (!) 102  Resp: (!) 24  18 (!) 22  Temp:      TempSrc:      SpO2: 99%  98% 100%  Weight:  86.2 kg    Height:  '5\' 10"'$  (1.778 m)     Eyes: Pupils equal, lids and conjunctivae normal ENMT: Mucous membranes are moist. .  Neck: normal, supple, no masses, no thyromegaly Respiratory: clear to auscultation bilaterally, no wheezing, no crackles. Normal respiratory effort. No accessory muscle use.  Cardiovascular: Tachycardic, irregular rate and rhythm, no murmurs / rubs / gallops. No extremity edema.  Abdomen: no tenderness, no masses palpated. No hepatosplenomegaly. Bowel sounds positive.  Musculoskeletal: no clubbing / cyanosis.  Right below-knee amputation, left transmetatarsal amputation , surgical sites clean without drainage or erythema, almost completely healed. Skin:   Scrotal Area examined with assistance of chaperone- RN- Linda.  Small amount of drainage in patient's depends, serosanguineous and dark in color.  Skin of scrotum with 2 surgical incision sites that are clean with no erythema, no induration.  One surgical site in the midline is open, 2nd surgical site is not quite apparent. Neurologic: No cranial nerve abnormality, speech clear without evidence of aphasia.  +5 strength bilateral  upper extremities.  He is able to move bilateral lower extremities equally against gravity. Psychiatric: Normal judgment and insight.  Drowsy but oriented x 3. Normal mood.   Labs on Admission: I have personally reviewed following labs and imaging studies  CBC: Recent Labs  Lab 04/13/22 0611 04/18/22 1446  WBC 8.6 9.4  NEUTROABS  --  7.2  HGB 7.9* 7.6*  HCT 24.7* 23.0*  MCV 92.9 93.5  PLT 273 332   Basic Metabolic Panel: Recent Labs  Lab 04/13/22 0611 04/15/22 0915 04/18/22 1446  NA 138  --  138  K 4.1  --  3.1*  CL 103  --  99  CO2 23  --  28  GLUCOSE 108*  --  108*  BUN 41*  --  28*  CREATININE 6.40*  --  4.76*  CALCIUM 9.2  --  8.8*  PHOS  --  4.8*  --    GFR: Estimated Creatinine Clearance: 16.8 mL/min (A) (by C-G formula based on SCr of 4.76 mg/dL (H)). Liver Function Tests: Recent Labs  Lab 04/18/22 1446  AST 22  ALT 27  ALKPHOS 98  BILITOT 0.9  PROT 7.6  ALBUMIN 3.6    Radiological Exams on Admission: CT Head Wo Contrast  Result Date: 04/18/2022 CLINICAL DATA:  Headache, new or worsening. EXAM: CT HEAD WITHOUT CONTRAST TECHNIQUE: Contiguous axial images were obtained from the base of the skull through the vertex without intravenous contrast. RADIATION DOSE REDUCTION: This exam was performed according to the departmental dose-optimization program which includes automated exposure control, adjustment of the mA and/or kV according to patient size and/or use of iterative reconstruction technique. COMPARISON:  CT examination dated March 07, 2022 FINDINGS: Brain: No evidence of acute infarction, hemorrhage, hydrocephalus, extra-axial collection or mass lesion/mass effect. Vascular: No hyperdense vessel or unexpected calcification. Skull: Normal. Negative for fracture or focal lesion. Sinuses/Orbits: No acute finding. Other: None. IMPRESSION: No acute intracranial abnormality. Electronically Signed   By: Keane Police D.O.   On: 04/18/2022 15:17   DG Chest Port 1  View  Result Date: 04/18/2022 CLINICAL DATA:  Weakness, short of breath, chest pain EXAM: PORTABLE CHEST 1 VIEW COMPARISON:  03/10/2022 FINDINGS: 2 frontal views of the chest demonstrates stable right internal jugular dialysis catheter. The cardiac silhouette is unremarkable. No airspace disease, effusion, or pneumothorax. There are no acute bony abnormalities. IMPRESSION: 1. Stable chest, no acute process. Electronically Signed   By: Randa Ngo M.D.   On: 04/18/2022 15:14    EKG: Independently reviewed.  Atrial fibrillation rate 125.  QTc 430.  No significant change from prior.  Assessment/Plan Principal Problem:   Atrial fibrillation with RVR ([name]) Active Problems:   Essential hypertension   CKD (chronic kidney disease) stage 5, GFR less than 15 ml/min ([name])   Scrotal infection   Anemia   Cirrhosis ([name])   Hypokalemia   S/P BKA (below knee amputation), right ([name])   S/P transmetatarsal amputation of foot, left ([name])    Assessment and Plan: * Atrial fibrillation with RVR ([place]) New diagnosis during recent hospitalization. CHADS2VASC score- 1- 2. For HTN hx, and If adding PAD for recent bilat amputations.  He was to start metoprolol 37.5 BID on discharge, but dose was decreased to half by nephrology after reports of low blood pressure- systolic 95J. He had gotten only one dose of '18mg'$  metoprolol since discharge.  Anticoagulation was initially started during epithelization with heparin, but this was discontinued due to anemia.  Patient required 9 units of blood.  Hemoglobin today 7.6, but has ranged from 7-8 over the past 2 weeks. -Recent echo 02/23/2022, EF of 60 to 65%. -Continue heparin drip - Metoprolol '50mg'$  x 1 given, will continue at '25mg'$  BID with prior reports of hypotension -IV metoprolol '5mg'$  q64mn x 2 -Correct K -Hold aspirin  Essential hypertension Stable. -  metoprolol '50mg'$  x 1 given continue with '25mg'$  BID  CKD (chronic kidney disease) stage 5, GFR less than 15 ml/min  ([name]) Started recent hospitalization for septic shock.  Has been dialysis dependent since 02/26/2022, initially started on CRRT and transition to IHD.  Schedule Tuesday Thursday Saturday.  Completed 2 1/2 hours of HD today.  HD terminated today after reports of headache- head CT unremarkable.  Reports he still makes 400-500 mils of urine daily.  Chest x-ray clear, no sign of volume overload.  -Please consult nephrology in the morning.  Anemia Hemoglobin 7.6 today, baseline over the past 2 weeks 7-8.  Required 9 units PRBC during recent hospitalization. -Monitor while on heparin drip.  Scrotal infection Scrotal infection with septic shock requiring pressor, requiring surgical debridement.Culture data showing MDR pseudomonas, enterococcus faecalis, bacteroides ovalis, beta lactamase positive.  Completed course of antibiotics.  He is to follow-up with infectious disease 6/5.  He has had persistent drainage from his surgical sites since surgery, no erythema or signs of infection on scrotal area.  Urology note 5/23, plan is to allow for continued drainage, plan to follow-up in 2 weeks.  Hypokalemia Potassium 3.1 .-  Replete -Check magnesium    DVT prophylaxis: Heparin Code Status: Full code Family Communication: Spouse at bedside Disposition Plan: ~ 1 - 2 days Consults called: Please consult nephrology in the morning Admission status: Obs tele  Author: Bethena Roys, MD 04/18/2022 6:05 PM  For on call review [email or web address].

## 2022-04-19 DIAGNOSIS — R519 Headache, unspecified: Secondary | ICD-10-CM | POA: Diagnosis not present

## 2022-04-19 DIAGNOSIS — N186 End stage renal disease: Secondary | ICD-10-CM

## 2022-04-19 DIAGNOSIS — I4891 Unspecified atrial fibrillation: Secondary | ICD-10-CM | POA: Diagnosis not present

## 2022-04-19 DIAGNOSIS — Z89511 Acquired absence of right leg below knee: Secondary | ICD-10-CM | POA: Diagnosis not present

## 2022-04-19 DIAGNOSIS — Z79899 Other long term (current) drug therapy: Secondary | ICD-10-CM | POA: Diagnosis not present

## 2022-04-19 DIAGNOSIS — Z7982 Long term (current) use of aspirin: Secondary | ICD-10-CM | POA: Diagnosis not present

## 2022-04-19 DIAGNOSIS — K7460 Unspecified cirrhosis of liver: Secondary | ICD-10-CM

## 2022-04-19 DIAGNOSIS — D638 Anemia in other chronic diseases classified elsewhere: Secondary | ICD-10-CM

## 2022-04-19 DIAGNOSIS — D649 Anemia, unspecified: Secondary | ICD-10-CM | POA: Diagnosis not present

## 2022-04-19 DIAGNOSIS — Z89432 Acquired absence of left foot: Secondary | ICD-10-CM

## 2022-04-19 DIAGNOSIS — Z992 Dependence on renal dialysis: Secondary | ICD-10-CM | POA: Diagnosis not present

## 2022-04-19 DIAGNOSIS — I120 Hypertensive chronic kidney disease with stage 5 chronic kidney disease or end stage renal disease: Secondary | ICD-10-CM | POA: Diagnosis not present

## 2022-04-19 LAB — BASIC METABOLIC PANEL
Anion gap: 10 (ref 5–15)
BUN: 35 mg/dL — ABNORMAL HIGH (ref 8–23)
CO2: 27 mmol/L (ref 22–32)
Calcium: 8.8 mg/dL — ABNORMAL LOW (ref 8.9–10.3)
Chloride: 104 mmol/L (ref 98–111)
Creatinine, Ser: 5.95 mg/dL — ABNORMAL HIGH (ref 0.61–1.24)
GFR, Estimated: 10 mL/min — ABNORMAL LOW (ref >60)
Glucose, Bld: 106 mg/dL — ABNORMAL HIGH (ref 70–99)
Potassium: 3.9 mmol/L (ref 3.5–5.1)
Sodium: 141 mmol/L (ref 135–145)

## 2022-04-19 LAB — CBC
HCT: 20.6 % — ABNORMAL LOW (ref 39.0–52.0)
Hemoglobin: 6.6 g/dL — CL (ref 13.0–17.0)
MCH: 30.4 pg (ref 26.0–34.0)
MCHC: 32 g/dL (ref 30.0–36.0)
MCV: 94.9 fL (ref 80.0–100.0)
Platelets: 210 10*3/uL (ref 150–400)
RBC: 2.17 MIL/uL — ABNORMAL LOW (ref 4.22–5.81)
RDW: 21.3 % — ABNORMAL HIGH (ref 11.5–15.5)
WBC: 7.2 10*3/uL (ref 4.0–10.5)
nRBC: 0 % (ref 0.0–0.2)

## 2022-04-19 LAB — HEPARIN LEVEL (UNFRACTIONATED)
Heparin Unfractionated: 0.21 IU/mL — ABNORMAL LOW (ref 0.30–0.70)
Heparin Unfractionated: 0.21 IU/mL — ABNORMAL LOW (ref 0.30–0.70)

## 2022-04-19 LAB — PREPARE RBC (CROSSMATCH)

## 2022-04-19 LAB — HEMOGLOBIN AND HEMATOCRIT, BLOOD
HCT: 21 % — ABNORMAL LOW (ref 39.0–52.0)
Hemoglobin: 6.8 g/dL — CL (ref 13.0–17.0)

## 2022-04-19 MED ORDER — HEPARIN BOLUS VIA INFUSION
1000.0000 [IU] | Freq: Once | INTRAVENOUS | Status: DC
  Filled 2022-04-19: qty 1000

## 2022-04-19 MED ORDER — HEPARIN BOLUS VIA INFUSION
1000.0000 [IU] | Freq: Once | INTRAVENOUS | Status: AC
  Administered 2022-04-19: 1000 [IU] via INTRAVENOUS
  Filled 2022-04-19: qty 1000

## 2022-04-19 MED ORDER — SODIUM CHLORIDE 0.9% IV SOLUTION: Freq: Once | INTRAVENOUS | Status: AC

## 2022-04-19 MED ORDER — CHLORHEXIDINE GLUCONATE CLOTH 2 % EX PADS
6.0000 | MEDICATED_PAD | Freq: Every day | CUTANEOUS | Status: DC
  Administered 2022-04-19 – 2022-04-20 (×2): 6 via TOPICAL

## 2022-04-19 MED ORDER — HEPARIN BOLUS VIA INFUSION
2000.0000 [IU] | Freq: Once | INTRAVENOUS | Status: AC
  Administered 2022-04-19: 2000 [IU] via INTRAVENOUS
  Filled 2022-04-19: qty 2000

## 2022-04-19 NOTE — Progress Notes (Addendum)
ANTICOAGULATION CONSULT NOTE -   Pharmacy Consult for Heparin Indication: atrial fibrillation  Allergies  Allergen Reactions   Codeine Hives    Insomnia, jittery   Flomax [Tamsulosin Hcl] Nausea Only    Nausea and fatigue    Patient Measurements: Height: '5\' 10"'$  (177.8 cm) Weight: 86.2 kg (190 lb) IBW/kg (Calculated) : 73 HEPARIN DW (KG): 86.2   Vital Signs: Temp: 98.5 F (36.9 C) (05/28 0507) Temp Source: Oral (05/28 0507) BP: 104/55 (05/28 0507) Pulse Rate: 117 (05/28 0830)  Labs: Recent Labs    04/18/22 1446 04/18/22 2313 04/19/22 0418 04/19/22 0539 04/19/22 0858  HGB 7.6*  --  6.6* 6.8*  --   HCT 23.0*  --  20.6* 21.0*  --   PLT 246  --  210  --   --   APTT 36  --   --   --   --   HEPARINUNFRC  --  <0.10*  --   --  0.21*  CREATININE 4.76*  --  5.95*  --   --      Estimated Creatinine Clearance: 13.5 mL/min (A) (by C-G formula based on SCr of 5.95 mg/dL (H)).   Medical History: Past Medical History:  Diagnosis Date   Concussion    Eosinophilic esophagitis    EGD/bx by Dr. Havery Moros   Gastric ulcer    History of kidney stones    Intermittent dysphagia    Migraine     Medications:  See med rec  Assessment: 61 yo  patient presents to the ED for concern of headache as well as  atrial fibrillation with rapid ventricular rate and incomplete dialysis session. Patient not on oral anticoagulation.  Pharmacy asked to start heparin  Heparin level 0.21, subtherapeutic. No issues with infusion Hgb 6.8, notified MD. No overt bleeding noted. Planned to transfuse 1 unit of blood.  Goal of Therapy:  Heparin level 0.3-0.7 units/ml Monitor platelets by anticoagulation protocol: Yes   Plan:  Give 1000 units bolus x 1 Increase heparin infusion at 1700 units/hr Check anti-Xa level in ~6-8 hours and daily while on heparin Continue to monitor H&H and platelets  Isac Sarna, BS Pharm D, BCPS Clinical Pharmacist 04/19/2022,9:57 AM

## 2022-04-19 NOTE — Assessment & Plan Note (Signed)
-  healing adequately  -continue supportive care and outpatient follow up with [name] as an outpatient.

## 2022-04-19 NOTE — Progress Notes (Signed)
Progress Note   Patient: Jeffrey Ayala EXH:[id number] DOB: 1961-02-19 [name]: 04/18/2022     0 DOS: the patient was seen and examined on 04/19/2022   Brief hospital course: As per H&P written by [name] on 04/18/2022 Jeffrey Ayala is a 61 y.o. male with medical history significant for HTN, Cirrhosis, cardiac arrest , Right BKA and transmetatarsal amputation or left foot, scrotal infection, atria fib. Most of these diagnosis were new diagnosis from recent prolonged and complicated hospitalization.   Patient was recently hospitalized 4/2 to 04/03/2022 and subsequently discharged to inpatient rehab.  Patient initially was admitted with septic shock secondary to scrotal infection, despite resuscitation he developed hypotension requiring vasopressors, was intubated 4/3 and had a brief cardiac arrest postintubation.  Cardiology was consulted during hospitalization, echo was normal and no further testing was required.  To correct vasoplegia, he was started on methylene blue infusion.  Due to septic shock, patient renal function worsened and he was started on CRRT.  Patient developed gangrene of the left and right foot undergoing underwent left transmetatarsal amputation and right BKA by [name].  Underwent scrotal debridement 4/24, 5/6 and required drains, and readmission to ICU. Culture data- showed MDR pseudomonas, enterococcus faecalis, bacteroides ovalis, beta lactamase positive. E faecalis and MDR Pseudomonas aeruginosa.  He was to continue on antibiotics till 5/13 per ID.  He required up to 9 units of blood during hospitalization.  Other problems during hospitalization included thrombocytopenia requiring 1 unit of platelets, elevated troponin secondary to demand ischemia, and shock liver with normalization of LFTs, atrial fibrillation with RVR-initially started on heparin drip but discontinued secondary to anemia.  Discharged on metoprolol and aspirin.  He was to continue dialysis on discharge, he was  discharged to [facility] 5/12.  Patient was just discharged home from CIR- 5/24.    My evaluation, patient is drowsy status post Benadryl and fentanyl, but responds to questions appropriately.  Since discharge patient has been doing well.  Patient denies palpitations, no chest pain or difficulty breathing.  He has had persistent discharge from his scrotum since discharge, per spouse, these are slightly bloody, no pus, no redness to scrotal area.  Patient went for dialysis today, there was some problems with the lines and there was thought that there was a clots in the lines, so these were changed, patient subsequently started dialysis.  And then developed severe headache which patient rated as a 9 out of 10.  Dialysis was stopped.  There was a concern for possible air embolus causing headaches, was subsequently referred to the ED.  On arrival to the ED headache had started to subside. Patient has had multiple sessions of dialysis, since initiation of HD in the hospital, apart from exhaustion he has tolerated them well.  He has not had prior headaches.  Patient had completed 2 and a 1/2 hours of dialysis today before it was discontinued.  He makes 400-500 mils of urine daily.   ED Course: Temp 98.5.  Heart rate 112 - 108.  Respiratory rate 13-26.  Blood pressure 110-140.  O2 sats greater than 95% on room air. Potassium 3.1. Hgb 7.6.  Chest x-ray clear.  Head CT without acute abnormality. IV metoprolol 5 mg given in ED with improvement in heart rate. EDP talked in nephrology- [name], recommended head CT to further evaluate headache. EDP talked to cardiology recommended starting anticoagulation as anemia is not severe or worsening. IV heparin started.  Assessment and Plan: * Atrial fibrillation with RVR ([name]) -New  diagnosis during recent hospitalization. CHADS2VASC score- 1- 2.  -Previously no discharged on anticoagulation due to ongoing anemia. -After discussing with cardiology at time of admission  decision was made to provide treatment with anticoagulation.  Patient completing 24 hours of heparin drip with intention to start Eliquis after that. -Continue the use of metoprolol to control heart rate -Continue telemetry monitoring -Maintain electrolytes stable (potassium more than 4 and magnesium more than 2). -Holding aspirin while providing anticoagulation.  Essential hypertension -Stable overall. -Continue to follow vital signs; continue metoprolol.  CKD (chronic kidney disease) stage 5, GFR less than 15 ml/min ([name]) -Chest x-ray did not demonstrate any signs of fluid overload -Patient on hemodialysis Tuesday Thursday and Saturday; last treatment 04/18/2022. -Nephrology has been consulted to further assist with management as he may require dialysis again on 04/21/2022.     Anemia - In the setting of anemia chronic kidney disease -Hemoglobin down to 6.6 -Will transfuse 1 unit of PRBCs -Follow hemoglobin trend. -No overt bleeding appreciated.  Scrotal infection -Recent admission for scrotal infection with septic shock requiring pressor, requiring surgical debridement.Culture data showing MDR pseudomonas, enterococcus faecalis, bacteroides ovalis, beta lactamase positive.  -Currently without signs of superimposed infection; completed course of antibiotics.  -He is to follow-up with infectious disease 04/27/22.  He has had persistent drainage from his surgical sites since surgery, no erythema or signs of infection appreciated. -Urology note 5/23, plan is to allow for continued drainage, plan to follow-up in 2 weeks. -Continue local wound care.  Cirrhosis ([place]) -Appears to be stable: -No significant ascites. -Continue supportive care and follow clinical response.  Okay  S/P transmetatarsal amputation of foot, left ([place]) -healing adequately  -continue supportive care and outpatient follow up with [name] as an outpatient.   S/P BKA (below knee amputation), right ([name]) -Stump  appears to be stable and healing adequately. -Continue outpatient follow-up with orthopedic service; patient has placed next week having fitting for prosthesis.  Hypokalemia -Potassium 3.1 at time of admission -Repleted and currently 3.9 -Magnesium within normal range currently. -Continue to follow electrolytes trend.    Overweight: -Body mass index is 27.26 kg/m. -Low calorie diet and portion control discussed with patient.  Subjective:  No fever, no chest pain, no nausea, no vomiting.  Reports feeling weak.  Physical Exam: Vitals:   04/19/22 0830 04/19/22 1021 04/19/22 1038 04/19/22 1039  BP:  98/63 (!) 100/56 (!) 100/56  Pulse: (!) 117 97 (!) 105 (!) 105  Resp:  '20 18 18  '$ Temp:  98.9 F (37.2 C) 99.3 F (37.4 C) 99.3 F (37.4 C)  TempSrc:  Oral  Oral  SpO2:  100%    Weight:      Height:       General exam: Alert, awake, oriented x 3; reports no chest pain, no nausea, no vomiting.  Patient is afebrile. Respiratory system: Clear to auscultation. Respiratory effort normal.  Good saturation on room air. Cardiovascular system: Rate controlled.  No rubs or gallops; no JVD. Gastrointestinal system: Abdomen is nondistended, soft and nontender. No organomegaly or masses felt. Normal bowel sounds heard. Central nervous system: Alert and oriented. No focal neurological deficits. Extremities: No cyanosis or clubbing appreciated; right BKA.  Left metatarsal amputation. Skin: No petechiae.  Multiple wounds present on admission, with serosanguineous drainage and positive slow; affecting scrotum, right elbow and left posterolateral thigh.  Present at time of admission; no signs of superimposed infection. Psychiatry: Judgement and insight appear normal. Mood & affect appropriate.   Data Reviewed:  CBC: Demonstrating WBC 7.2, hemoglobin 6.6, platelet count 545 K Basic metabolic panel demonstrating sodium 141, potassium 3.9, bicarb 27, BUN 35, creatinine 5.95  Family Communication: No  family at bedside.  Disposition: Status is: Observation The patient remains OBS appropriate and will d/c before 2 midnights.   Planned Discharge Destination: Home   Author: Barton Dubois, MD 04/19/2022 11:56 AM  For on call review [email or web address].

## 2022-04-19 NOTE — Assessment & Plan Note (Addendum)
-  Stump appears to be stable and healing adequately. -Continue outpatient follow-up with orthopedic service; patient has expressed next week having fitting for prosthesis.

## 2022-04-19 NOTE — Progress Notes (Signed)
ANTICOAGULATION CONSULT NOTE -   Pharmacy Consult for Heparin Indication: atrial fibrillation  Allergies  Allergen Reactions   Codeine Hives    Insomnia, jittery   Flomax [Tamsulosin Hcl] Nausea Only    Nausea and fatigue    Patient Measurements: Height: '5\' 10"'$  (177.8 cm) Weight: 86.2 kg (190 lb) IBW/kg (Calculated) : 73 HEPARIN DW (KG): 86.2   Vital Signs: Temp: 99.6 F (37.6 C) (05/28 2038) Temp Source: Oral (05/28 2038) BP: 124/72 (05/28 2038) Pulse Rate: 106 (05/28 2039)  Labs: Recent Labs    04/18/22 1446 04/18/22 2313 04/19/22 0418 04/19/22 0539 04/19/22 0858 04/19/22 2055  HGB 7.6*  --  6.6* 6.8*  --   --   HCT 23.0*  --  20.6* 21.0*  --   --   PLT 246  --  210  --   --   --   APTT 36  --   --   --   --   --   HEPARINUNFRC  --  <0.10*  --   --  0.21* 0.21*  CREATININE 4.76*  --  5.95*  --   --   --      Estimated Creatinine Clearance: 13.5 mL/min (A) (by C-G formula based on SCr of 5.95 mg/dL (H)).   Medical History: Past Medical History:  Diagnosis Date   Concussion    Eosinophilic esophagitis    EGD/bx by Dr. Havery Moros   Gastric ulcer    History of kidney stones    Intermittent dysphagia    Migraine     Medications:  See med rec  Assessment: 61 yo  patient presents to the ED for concern of headache as well as  atrial fibrillation with rapid ventricular rate and incomplete dialysis session. Patient not on oral anticoagulation.  Pharmacy asked to start heparin  Heparin level 0.21, subtherapeutic. No issues with infusion. No overt bleeding noted. Heparin On hold from 1200-1400 for transfusion 1 unit of blood.  Goal of Therapy:  Heparin level 0.3-0.7 units/ml Monitor platelets by anticoagulation protocol: Yes   Plan:  Give 1000 units bolus x 1 Increase heparin infusion at 1800 units/hr Check anti-Xa level in ~6-8 hours and daily while on heparin Continue to monitor H&H and platelets  Isac Sarna, BS Pharm D, BCPS Clinical  Pharmacist 04/19/2022,9:41 PM

## 2022-04-19 NOTE — Progress Notes (Signed)
  Transition of Care [facility]) Screening Note   Patient Details  Name: Jeffrey Ayala Date of Birth: 01-15-1961   Transition of Care [facility]) CM/SW Contact:    Iona Beard, [place] Phone Number: 04/19/2022, 10:36 AM    Transition of Care Department [facility]) has reviewed patient and no TOC needs have been identified at this time. We will continue to monitor patient advancement through interdisciplinary progression rounds. If new patient transition needs arise, please place a TOC consult.

## 2022-04-19 NOTE — Consult Note (Signed)
WOC Nurse Consult Note: Reason for Consult:chronic, nonhealing wounds to right elbow, arm Wound type:full thickness Pressure Injury POA: N/A Measurement:Per Nursing Flow Sheet today, 0.6cm round x 0.2cm with red (75%) and yellow (25%) wound bed Wound bed:As noted above Drainage (amount, consistency, odor) small serous to light yellow Periwound: intact, dry Dressing procedure/placement/frequency: Dr. Dyann Kief notes that these wounds (as well as others to the scrotum) are chronic, and do not appear infected. Topical care guidance is provided for Nursing using daily application of soap and water to cleanse, followed by rinse and pat dry. Dressing with antimicrobial nonadherent (xeroform) is indicated, topped with ABD pad for comfort and secured with Kerlix roll gauze/paper tape.   [place] nursing team will not follow, but will remain available to this patient, the nursing and medical teams.  Please re-consult if needed. Thanks, Maudie Flakes, MSN, RN, [place], Arther Abbott  Pager# [phone]

## 2022-04-19 NOTE — Assessment & Plan Note (Addendum)
-  Appears to be stable: -No significant ascites. -Continue supportive care and follow up with gastroenterology service as an outpatient.

## 2022-04-19 NOTE — Progress Notes (Signed)
ANTICOAGULATION CONSULT NOTE - Follow Up Consult  Pharmacy Consult for heparin Indication: atrial fibrillation  Labs: Recent Labs    04/18/22 1446 04/18/22 2313  HGB 7.6*  --   HCT 23.0*  --   PLT 246  --   APTT 36  --   HEPARINUNFRC  --  <0.10*  CREATININE 4.76*  --     Assessment: 61yo male subtherapeutic on heparin with initial dosing for Afib; no infusion issues or signs of bleeding per RN.  Goal of Therapy:  Heparin level 0.3-0.7 units/ml   Plan:  Will rebolus with heparin 2000 units and increase heparin infusion by 4 units/kg/hr to 1500 units/hr and check level in 8 hours.    Wynona Neat, PharmD, BCPS  04/19/2022,12:39 AM

## 2022-04-20 DIAGNOSIS — Z89511 Acquired absence of right leg below knee: Secondary | ICD-10-CM | POA: Diagnosis not present

## 2022-04-20 DIAGNOSIS — K7460 Unspecified cirrhosis of liver: Secondary | ICD-10-CM | POA: Diagnosis not present

## 2022-04-20 DIAGNOSIS — D649 Anemia, unspecified: Secondary | ICD-10-CM | POA: Diagnosis not present

## 2022-04-20 DIAGNOSIS — I4891 Unspecified atrial fibrillation: Secondary | ICD-10-CM | POA: Diagnosis not present

## 2022-04-20 DIAGNOSIS — Z79899 Other long term (current) drug therapy: Secondary | ICD-10-CM | POA: Diagnosis not present

## 2022-04-20 DIAGNOSIS — Z7982 Long term (current) use of aspirin: Secondary | ICD-10-CM | POA: Diagnosis not present

## 2022-04-20 DIAGNOSIS — I120 Hypertensive chronic kidney disease with stage 5 chronic kidney disease or end stage renal disease: Secondary | ICD-10-CM | POA: Diagnosis not present

## 2022-04-20 DIAGNOSIS — R519 Headache, unspecified: Secondary | ICD-10-CM | POA: Diagnosis not present

## 2022-04-20 DIAGNOSIS — Z992 Dependence on renal dialysis: Secondary | ICD-10-CM | POA: Diagnosis not present

## 2022-04-20 DIAGNOSIS — N186 End stage renal disease: Secondary | ICD-10-CM | POA: Diagnosis not present

## 2022-04-20 LAB — TYPE AND SCREEN
ABO/RH(D): A POS
Antibody Screen: NEGATIVE
Unit division: 0

## 2022-04-20 LAB — BPAM RBC
Blood Product Expiration Date: 202306192359
ISSUE DATE / TIME: 202305281012
Unit Type and Rh: 6200

## 2022-04-20 LAB — CBC
HCT: 24 % — ABNORMAL LOW (ref 39.0–52.0)
Hemoglobin: 7.9 g/dL — ABNORMAL LOW (ref 13.0–17.0)
MCH: 31.3 pg (ref 26.0–34.0)
MCHC: 32.9 g/dL (ref 30.0–36.0)
MCV: 95.2 fL (ref 80.0–100.0)
Platelets: 216 10*3/uL (ref 150–400)
RBC: 2.52 MIL/uL — ABNORMAL LOW (ref 4.22–5.81)
RDW: 20.4 % — ABNORMAL HIGH (ref 11.5–15.5)
WBC: 7.3 10*3/uL (ref 4.0–10.5)
nRBC: 0 % (ref 0.0–0.2)

## 2022-04-20 LAB — HEPARIN LEVEL (UNFRACTIONATED): Heparin Unfractionated: 0.26 IU/mL — ABNORMAL LOW (ref 0.30–0.70)

## 2022-04-20 MED ORDER — APIXABAN 5 MG PO TABS: 5.0000 mg | ORAL_TABLET | Freq: Two times a day (BID) | ORAL | 2 refills | Status: DC

## 2022-04-20 MED ORDER — METOPROLOL TARTRATE 25 MG PO TABS: 25.0000 mg | ORAL_TABLET | Freq: Two times a day (BID) | ORAL | 2 refills | Status: DC

## 2022-04-20 MED ORDER — APIXABAN 5 MG PO TABS
5.0000 mg | ORAL_TABLET | Freq: Two times a day (BID) | ORAL | Status: DC
  Administered 2022-04-20: 5 mg via ORAL
  Filled 2022-04-20: qty 1

## 2022-04-20 NOTE — Discharge Summary (Signed)
Physician Discharge Summary   Patient: Jeffrey Ayala MRN: [id number] DOB: [birth year]  Admit date:     04/18/2022  Discharge date: 04/20/22  Discharge Physician: Barton Dubois   PCP: Jerline Pain, MD   Recommendations at discharge:  The medications are prescribed Resume outpatient follow-up with hemodialysis as previously scheduled Follow-up with PCP/cardiology in 10 days Follow heart healthy/low-sodium diet. Repeat CBC to follow hemoglobin trend/stability.  Discharge Diagnoses: Principal Problem:   Atrial fibrillation with RVR ([place]) Active Problems:   Essential hypertension   CKD (chronic kidney disease) stage 5, GFR less than 15 ml/min ([name])   Scrotal infection   Anemia   Cirrhosis ([name])   Acute nonintractable headache   Hypokalemia   S/P BKA (below knee amputation), right ([name])   S/P transmetatarsal amputation of foot, left ([name])   Atrial fibrillation with rapid ventricular response ([name])   ESRD (end stage renal disease) [facility])  Hospital Course: As per H&P written by [name] on 04/18/2022 Jeffrey Ayala is a 61 y.o. male with medical history significant for HTN, Cirrhosis, cardiac arrest , Right BKA and transmetatarsal amputation or left foot, scrotal infection, atria fib. Most of these diagnosis were new diagnosis from recent prolonged and complicated hospitalization.   Patient was recently hospitalized 4/2 to 04/03/2022 and subsequently discharged to inpatient rehab.  Patient initially was admitted with septic shock secondary to scrotal infection, despite resuscitation he developed hypotension requiring vasopressors, was intubated 4/3 and had a brief cardiac arrest postintubation.  Cardiology was consulted during hospitalization, echo was normal and no further testing was required.  To correct vasoplegia, he was started on methylene blue infusion.  Due to septic shock, patient renal function worsened and he was started on CRRT.  Patient developed gangrene of the left  and right foot undergoing underwent left transmetatarsal amputation and right BKA by [name].  Underwent scrotal debridement 4/24, 5/6 and required drains, and readmission to ICU. Culture data- showed MDR pseudomonas, enterococcus faecalis, bacteroides ovalis, beta lactamase positive. E faecalis and MDR Pseudomonas aeruginosa.  He was to continue on antibiotics till 5/13 per ID.  He required up to 9 units of blood during hospitalization.  Other problems during hospitalization included thrombocytopenia requiring 1 unit of platelets, elevated troponin secondary to demand ischemia, and shock liver with normalization of LFTs, atrial fibrillation with RVR-initially started on heparin drip but discontinued secondary to anemia.  Discharged on metoprolol and aspirin.  He was to continue dialysis on discharge, he was discharged to [facility] 5/12.  Patient was just discharged home from CIR- 5/24.    My evaluation, patient is drowsy status post Benadryl and fentanyl, but responds to questions appropriately.  Since discharge patient has been doing well.  Patient denies palpitations, no chest pain or difficulty breathing.  He has had persistent discharge from his scrotum since discharge, per spouse, these are slightly bloody, no pus, no redness to scrotal area.  Patient went for dialysis today, there was some problems with the lines and there was thought that there was a clots in the lines, so these were changed, patient subsequently started dialysis.  And then developed severe headache which patient rated as a 9 out of 10.  Dialysis was stopped.  There was a concern for possible air embolus causing headaches, was subsequently referred to the ED.  On arrival to the ED headache had started to subside. Patient has had multiple sessions of dialysis, since initiation of HD in the hospital, apart from exhaustion he has tolerated them  well.  He has not had prior headaches.  Patient had completed 2 and a 1/2 hours of dialysis today  before it was discontinued.  He makes 400-500 mils of urine daily.   ED Course: Temp 98.5.  Heart rate 112 - 108.  Respiratory rate 13-26.  Blood pressure 110-140.  O2 sats greater than 95% on room air. Potassium 3.1. Hgb 7.6.  Chest x-ray clear.  Head CT without acute abnormality. IV metoprolol 5 mg given in ED with improvement in heart rate. EDP talked in nephrology- [name], recommended head CT to further evaluate headache. EDP talked to cardiology recommended starting anticoagulation as anemia is not severe or worsening. IV heparin started.  Assessment and Plan: * Atrial fibrillation with RVR ([name]) -New diagnosis during recent hospitalization. CHADS2VASC score- 1- 2.  -Previously no discharged on anticoagulation due to ongoing anemia. -After discussing with cardiology at time of admission decision was made to provide treatment with anticoagulation.  Patient completing 24 hours of heparin drip with intention to start Eliquis after that. -Continue the use of metoprolol to control heart rate -Continue outpatient follow-up with cardiology service. -Maintain electrolytes stable (with a potassium level more than 4 and magnesium more than 2 as much as possible). -Aspirin has been discontinued; patient now started on Eliquis.  Essential hypertension -Stable and well-controlled overall. -Continue current dose of metoprolol -Patient advised to maintain adequate hydration.  CKD (chronic kidney disease) stage 5, GFR less than 15 ml/min ([name]) -Chest x-ray did not demonstrate any signs of fluid overload -Patient on hemodialysis Tuesday-Thursday and Saturday; last treatment 04/18/2022. -Nephrology service was made aware of patient admission; at this time given his at ability safe to discharge home with resumption of outpatient hemodialysis schedule.  Next treatment 04/21/2022.   -Advised to follow low-sodium/renal diet.   Anemia - In the setting of anemia chronic kidney disease -Hemoglobin down  to 6.6 -Will transfuse 1 unit of PRBCs during this hospitalization -Hemoglobin at discharge back to his baseline 7.9. -IV iron and Epogen as per nephrology service discretion. -Follow hemoglobin trend.   Scrotal infection -Recent admission for scrotal infection with septic shock requiring pressor, requiring surgical debridement.Culture data showing MDR pseudomonas, enterococcus faecalis, bacteroides ovalis, beta lactamase positive.  -Currently without signs of superimposed infection; completed course of antibiotics.  -He is to follow-up with infectious disease 04/27/22.  He has had persistent drainage from his surgical sites since surgery, no erythema or signs of infection appreciated. -Urology note 5/23, plan is to allow for continued drainage. -Keep area clean and dry; follow-up with urology as planned in 2 weeks.    Cirrhosis ([place]) -Appears to be stable: -No significant ascites. -Continue supportive care and follow up with gastroenterology service as an outpatient.  Acute nonintractable headache - Continue as needed analgesics; good response to the use of acetaminophen. -CT head without acute intracranial abnormalities.  S/P transmetatarsal amputation of foot, left ([place]) -healing adequately  -continue supportive care and outpatient follow up with [name] as an outpatient.   S/P BKA (below knee amputation), right ([name]) -Stump appears to be stable and healing adequately. -Continue outpatient follow-up with orthopedic service; patient has expressed next week having fitting for prosthesis.  Hypokalemia -Potassium 3.1 at time of admission -Repleted and currently 3.9 -Magnesium within normal range currently. -Continue to follow electrolytes trend.     Consultants: Nephrology service, cardiology was curbside at time of admission by EDP. Procedures performed: See below for x-ray reports. Disposition: Home Diet recommendation: Nephrology/low-sodium diet.  DISCHARGE  MEDICATION:  Allergies as of 04/20/2022       Reactions   Codeine Hives   Insomnia, jittery   Flomax [tamsulosin Hcl] Nausea Only   Nausea and fatigue        Medication List     STOP taking these medications    aspirin 81 MG chewable tablet       TAKE these medications    acetaminophen 650 MG CR tablet Commonly known as: TYLENOL Take 650 mg by mouth every 8 (eight) hours as needed for pain or fever.   allopurinol 100 MG tablet Commonly known as: Zyloprim Take 1 tablet (100 mg total) by mouth daily.   apixaban 5 MG Tabs tablet Commonly known as: ELIQUIS Take 1 tablet (5 mg total) by mouth 2 (two) times daily.   ascorbic acid 1000 MG tablet Commonly known as: VITAMIN C Take 1 tablet (1,000 mg total) by mouth daily.   folic acid 1 MG tablet Commonly known as: FOLVITE Take 1 tablet (1 mg total) by mouth daily.   hydrOXYzine 10 MG/5ML syrup Commonly known as: ATARAX Take 12.5 mLs (25 mg total) by mouth 2 (two) times daily as needed for itching.   lanthanum 1000 MG chewable tablet Commonly known as: FOSRENOL Chew 1 tablet (1,000 mg total) by mouth 3 (three) times daily with meals.   leptospermum manuka honey Pste paste Apply 1 application. topically daily. To elbow wound, cover with damp to dry dressing. Change daily.   melatonin 5 MG Tabs Take 1 tablet (5 mg total) by mouth at bedtime.   metoprolol tartrate 25 MG tablet Commonly known as: LOPRESSOR Take 1 tablet (25 mg total) by mouth 2 (two) times daily. What changed:  medication strength how much to take when to take this   multivitamin Tabs tablet Take 1 tablet by mouth at bedtime.   oxyCODONE 5 MG immediate release tablet Commonly known as: Oxy IR/ROXICODONE Take 1 tablet (5 mg total) by mouth daily as needed for severe pain.   pantoprazole 40 MG tablet Commonly known as: PROTONIX Take 1 tablet (40 mg total) by mouth daily.   QUEtiapine 25 MG tablet Commonly known as: SEROQUEL Take 1 tablet  (25 mg total) by mouth at bedtime. What changed: how much to take   simethicone 80 MG chewable tablet Commonly known as: MYLICON Chew 1 tablet (80 mg total) by mouth 4 (four) times daily as needed for flatulence.   traZODone 50 MG tablet Commonly known as: DESYREL Take 0.5-1 tablets (25-50 mg total) by mouth at bedtime as needed for sleep.               Discharge Care Instructions  (From admission, onward)           Start     Ordered   04/20/22 0000  Discharge wound care:       Comments: Keep area clean and dry in the right elbow; use soap and water rinse and pat dry.  Cover area with Xeroform and top with ABD pad and secure with Kerlix roll gauze/paper tape.  Change on daily basis.   04/20/22 1332            Follow-up Information     Jerline Pain, MD. Schedule an appointment as soon as possible for a visit in 10 day(s).   Specialty: Cardiology Contact information: [address] [place] [address] [phone]                Discharge Exam: Danley Danker  Weights   04/18/22 1440  Weight: 86.2 kg   General exam: Alert, awake, oriented x 3; no chest pain, no headache, no palpitations, no shortness of breath.  Feeling ready to go home.  Afebrile. Respiratory system: Clear to auscultation. Respiratory effort normal.  No using accessory muscle.  Good saturation on room air. Cardiovascular system: No rubs or gallops; rate controlled, irregular rhythm.  No JVD. Gastrointestinal system: Abdomen is nondistended, soft and nontender. No organomegaly or masses felt. Normal bowel sounds heard. Central nervous system: Alert and oriented. No focal neurological deficits. Extremities: No cyanosis or clubbing appreciated; right BKA and left foot status post metatarsal amputation. Skin: No petechiae; multiple wounds present on admission, mild serosanguineous drainage and positive slough (right elbow area), other affected areas including scrotum, left  posterolateral thigh and left foot.  All of the wounds were present at time of admission and there was no signs of superimposed infection. Psychiatry: Judgement and insight appear normal. Mood & affect appropriate.    Condition at discharge: Stable and improved.  The results of significant diagnostics from this hospitalization (including imaging, microbiology, ancillary and laboratory) are listed below for reference.   Imaging Studies: CT Head Wo Contrast  Result Date: 04/18/2022 CLINICAL DATA:  Headache, new or worsening. EXAM: CT HEAD WITHOUT CONTRAST TECHNIQUE: Contiguous axial images were obtained from the base of the skull through the vertex without intravenous contrast. RADIATION DOSE REDUCTION: This exam was performed according to the departmental dose-optimization program which includes automated exposure control, adjustment of the mA and/or kV according to patient size and/or use of iterative reconstruction technique. COMPARISON:  CT examination dated March 07, 2022 FINDINGS: Brain: No evidence of acute infarction, hemorrhage, hydrocephalus, extra-axial collection or mass lesion/mass effect. Vascular: No hyperdense vessel or unexpected calcification. Skull: Normal. Negative for fracture or focal lesion. Sinuses/Orbits: No acute finding. Other: None. IMPRESSION: No acute intracranial abnormality. Electronically Signed   By: Keane Police D.O.   On: 04/18/2022 15:17   DG Chest Port 1 View  Result Date: 04/18/2022 CLINICAL DATA:  Weakness, short of breath, chest pain EXAM: PORTABLE CHEST 1 VIEW COMPARISON:  03/10/2022 FINDINGS: 2 frontal views of the chest demonstrates stable right internal jugular dialysis catheter. The cardiac silhouette is unremarkable. No airspace disease, effusion, or pneumothorax. There are no acute bony abnormalities. IMPRESSION: 1. Stable chest, no acute process. Electronically Signed   By: Randa Ngo M.D.   On: 04/18/2022 15:14   CT RENAL STONE STUDY  Result  Date: 03/31/2022 CLINICAL DATA:  Anemia. EXAM: CT ABDOMEN AND PELVIS WITHOUT CONTRAST TECHNIQUE: Multidetector CT imaging of the abdomen and pelvis was performed following the standard protocol without IV contrast. RADIATION DOSE REDUCTION: This exam was performed according to the departmental dose-optimization program which includes automated exposure control, adjustment of the mA and/or kV according to patient size and/or use of iterative reconstruction technique. COMPARISON:  CT abdomen pelvis dated 02/28/2022. FINDINGS: Evaluation of this exam is limited in the absence of intravenous contrast. Lower chest: The visualized lung bases are clear. No intra-abdominal free air.  Small perihepatic ascites. Hepatobiliary: Slight irregularity of the liver contour suspicious for early changes of cirrhosis. Clinical correlation is recommended. No intrahepatic biliary dilatation. The gallbladder is unremarkable. Pancreas: Unremarkable. No pancreatic ductal dilatation or surrounding inflammatory changes. Spleen: Splenomegaly measuring 20 cm in length. Adrenals/Urinary Tract: The adrenal glands are unremarkable. Multiple nonobstructing bilateral renal calculi measure up to 1 cm. There is no hydronephrosis on either side. The visualized ureters and urinary bladder appear  unremarkable. Stomach/Bowel: A rectal tube is in place. There are scattered sigmoid diverticula without active inflammatory changes. There is no bowel obstruction or active inflammation. The appendix is normal. Vascular/Lymphatic: Mild aortoiliac atherosclerotic disease. The IVC is unremarkable. No portal venous gas. There is no adenopathy. Reproductive: The prostate and seminal vesicles are grossly unremarkable. No pelvic mass. Other: Mild diffuse subcutaneous edema.  No fluid collection. Musculoskeletal: Degenerative changes of the spine. No acute osseous pathology. IMPRESSION: 1. No acute intra-abdominal or pelvic pathology. 2. Multiple nonobstructing  bilateral renal calculi. No hydronephrosis. 3. Colonic diverticulosis. No bowel obstruction. Normal appendix. 4. Cirrhosis with splenomegaly and small perihepatic ascites. 5. Aortic Atherosclerosis (ICD10-I70.0). Electronically Signed   By: Anner Crete M.D.   On: 03/31/2022 03:38   VAS [place] UPPER EXT VEIN MAPPING (PRE-OP AVF)  Result Date: 04/03/2022 UPPER EXTREMITY VEIN MAPPING Patient Name:  Jeffrey Ayala  Date of Exam:   04/03/2022 Medical Rec #: [id number]         Accession #:    [id number] Date of Birth: 14-Mar-1961         Patient Gender: M Patient Age:   61 years Exam Location:  [facility] Procedure:      VAS [place] UPPER EXT VEIN MAPPING (PRE-OP AVF) Referring Phys: [name] --------------------------------------------------------------------------------  Indications: Pre-access. Limitations: Patient somnolence, patient movement, IV's, bandages Comparison Study: No prior studies. Performing Technologist: Oliver Hum RVT  Examination Guidelines: A complete evaluation includes B-mode imaging, spectral Doppler, color Doppler, and power Doppler as needed of all accessible portions of each vessel. Bilateral testing is considered an integral part of a complete examination. Limited examinations for reoccurring indications may be performed as noted. +-----------------+-------------+----------+----------------------+ Right Cephalic   Diameter (cm)Depth (cm)       Findings        +-----------------+-------------+----------+----------------------+ Shoulder             0.35        1.50                          +-----------------+-------------+----------+----------------------+ Prox upper arm       0.37        0.94                          +-----------------+-------------+----------+----------------------+ Mid upper arm        0.31        0.35                          +-----------------+-------------+----------+----------------------+ Dist upper arm       0.31        0.39                           +-----------------+-------------+----------+----------------------+ Antecubital fossa    0.43        0.36         branching        +-----------------+-------------+----------+----------------------+ Prox forearm         0.35        0.38          Thrombus        +-----------------+-------------+----------+----------------------+ Mid forearm          0.36        0.50   Thrombus and branching +-----------------+-------------+----------+----------------------+ Dist forearm         0.26  0.28         branching        +-----------------+-------------+----------+----------------------+ +-----------------+-------------+----------+---------+ Right Basilic    Diameter (cm)Depth (cm)Findings  +-----------------+-------------+----------+---------+ Shoulder             0.59        1.60             +-----------------+-------------+----------+---------+ Prox upper arm       0.43        1.50             +-----------------+-------------+----------+---------+ Mid upper arm        0.37        1.00             +-----------------+-------------+----------+---------+ Dist upper arm       0.41        0.72   branching +-----------------+-------------+----------+---------+ Antecubital fossa    0.29        0.30             +-----------------+-------------+----------+---------+ Prox forearm         0.22        0.32   branching +-----------------+-------------+----------+---------+ Mid forearm          0.20        0.26   branching +-----------------+-------------+----------+---------+ Distal forearm       0.11        0.21             +-----------------+-------------+----------+---------+ +-----------------+-------------+----------+--------------+ Left Cephalic    Diameter (cm)Depth (cm)   Findings    +-----------------+-------------+----------+--------------+ Shoulder             0.33        1.90                   +-----------------+-------------+----------+--------------+ Prox upper arm       0.34        0.78                  +-----------------+-------------+----------+--------------+ Mid upper arm        0.32        0.46                  +-----------------+-------------+----------+--------------+ Dist upper arm       0.44        0.41                  +-----------------+-------------+----------+--------------+ Antecubital fossa    0.33        0.31     branching    +-----------------+-------------+----------+--------------+ Prox forearm         0.44        0.24     branching    +-----------------+-------------+----------+--------------+ Mid forearm          0.29        0.32                  +-----------------+-------------+----------+--------------+ Dist forearm                            not visualized +-----------------+-------------+----------+--------------+ +-----------------+-------------+----------+---------+ Left Basilic     Diameter (cm)Depth (cm)Findings  +-----------------+-------------+----------+---------+ Shoulder             0.80        1.40             +-----------------+-------------+----------+---------+ Mid upper arm        0.63  1.40             +-----------------+-------------+----------+---------+ Dist upper arm       0.61        0.45   branching +-----------------+-------------+----------+---------+ Antecubital fossa    0.50        0.53   branching +-----------------+-------------+----------+---------+ Prox forearm         0.34        0.54   Thrombus  +-----------------+-------------+----------+---------+ Mid forearm          0.30        0.40   Thrombus  +-----------------+-------------+----------+---------+ Distal forearm       0.17        0.21             +-----------------+-------------+----------+---------+ *See table(s) above for measurements and observations.  Diagnosing physician: Monica Martinez MD Electronically  signed by Monica Martinez MD on 04/03/2022 at 1:29:31 PM.    Final     Microbiology: Results for orders placed or performed during the hospital encounter of 02/22/22  Resp Panel by RT-PCR (Flu A&B, Covid) Nasopharyngeal Swab     Status: None   Collection Time: 02/22/22  5:50 PM   Specimen: Nasopharyngeal Swab; Nasopharyngeal(NP) swabs in vial transport medium  Result Value Ref Range Status   SARS Coronavirus 2 by RT PCR NEGATIVE NEGATIVE Final    Comment: (NOTE) SARS-CoV-2 target nucleic acids are NOT DETECTED.  The SARS-CoV-2 RNA is generally detectable in upper respiratory specimens during the acute phase of infection. The lowest concentration of SARS-CoV-2 viral copies this assay can detect is 138 copies/mL. A negative result does not preclude SARS-Cov-2 infection and should not be used as the sole basis for treatment or other patient management decisions. A negative result may occur with  improper specimen collection/handling, submission of specimen other than nasopharyngeal swab, presence of viral mutation(s) within the areas targeted by this assay, and inadequate number of viral copies(<138 copies/mL). A negative result must be combined with clinical observations, patient history, and epidemiological information. The expected result is Negative.  Fact Sheet for Patients:  EntrepreneurPulse.com.au  Fact Sheet for Healthcare Providers:  IncredibleEmployment.be  This test is no t yet approved or cleared by the [place] FDA and  has been authorized for detection and/or diagnosis of SARS-CoV-2 by FDA under an Emergency Use Authorization (EUA). This EUA will remain  in effect (meaning this test can be used) for the duration of the COVID-19 declaration under Section 564(b)(1) of the Act, 21 U.S.C.section 360bbb-3(b)(1), unless the authorization is terminated  or revoked sooner.       Influenza A by PCR NEGATIVE NEGATIVE Final    Influenza B by PCR NEGATIVE NEGATIVE Final    Comment: (NOTE) The Xpert Xpress SARS-CoV-2/FLU/RSV plus assay is intended as an aid in the diagnosis of influenza from Nasopharyngeal swab specimens and should not be used as a sole basis for treatment. Nasal washings and aspirates are unacceptable for Xpert Xpress SARS-CoV-2/FLU/RSV testing.  Fact Sheet for Patients: EntrepreneurPulse.com.au  Fact Sheet for Healthcare Providers: IncredibleEmployment.be  This test is not yet approved or cleared by the [place] FDA and has been authorized for detection and/or diagnosis of SARS-CoV-2 by FDA under an Emergency Use Authorization (EUA). This EUA will remain in effect (meaning this test can be used) for the duration of the COVID-19 declaration under Section 564(b)(1) of the Act, 21 U.S.C. section 360bbb-3(b)(1), unless the authorization is terminated or revoked.  Performed at [facility], [place]  [address] [place], [place] [address]   Blood Culture (routine x 2)     Status: None   Collection Time: 02/22/22  6:05 PM   Specimen: BLOOD RIGHT HAND  Result Value Ref Range Status   Specimen Description BLOOD RIGHT HAND  Final   Special Requests   Final    BOTTLES DRAWN AEROBIC AND ANAEROBIC Blood Culture adequate volume   Culture   Final    NO GROWTH 5 DAYS Performed at [facility], [place] [address], [place], [place] [address]    Report Status 02/27/2022 FINAL  Final  Blood Culture (routine x 2)     Status: None   Collection Time: 02/22/22  6:07 PM   Specimen: BLOOD LEFT HAND  Result Value Ref Range Status   Specimen Description BLOOD LEFT HAND  Final   Special Requests   Final    BOTTLES DRAWN AEROBIC AND ANAEROBIC Blood Culture adequate volume   Culture   Final    NO GROWTH 5 DAYS Performed at [facility], [place] [address] [place], [place] [address]    Report Status 02/27/2022 FINAL  Final  C Difficile Quick Screen w PCR  reflex     Status: None   Collection Time: 02/22/22  8:40 PM   Specimen: STOOL  Result Value Ref Range Status   C Diff antigen NEGATIVE NEGATIVE Final   C Diff toxin NEGATIVE NEGATIVE Final   C Diff interpretation No C. difficile detected.  Final    Comment: Performed at [facility], [place] [address], [place], [place] [address]  Urine Culture     Status: Abnormal   Collection Time: 02/22/22  8:41 PM   Specimen: Urine, Clean Catch  Result Value Ref Range Status   Specimen Description URINE, CLEAN CATCH  Final   Special Requests   Final    NONE Performed at [facility], [place] [address], [place], [place] [address]    Culture MULTIPLE SPECIES PRESENT, SUGGEST RECOLLECTION (A)  Final   Report Status 02/24/2022 FINAL  Final  MRSA Next Gen by PCR, Nasal     Status: None   Collection Time: 02/23/22  3:10 AM   Specimen: Nasal Mucosa; Nasal Swab  Result Value Ref Range Status   MRSA by PCR Next Gen NOT DETECTED NOT DETECTED Final    Comment: (NOTE) The GeneXpert MRSA Assay (FDA approved for NASAL specimens only), is one component of a comprehensive MRSA colonization surveillance program. It is not intended to diagnose MRSA infection nor to guide or monitor treatment for MRSA infections. Test performance is not FDA approved in patients less than 54 years old. Performed at [facility], [place] [address], [place], [place] [address]   Culture, Respiratory w Gram Stain     Status: None   Collection Time: 02/26/22  3:53 PM   Specimen: Tracheal Aspirate; Respiratory  Result Value Ref Range Status   Specimen Description TRACHEAL ASPIRATE  Final   Special Requests NONE  Final   Gram Stain   Final    ABUNDANT SQUAMOUS EPITHELIAL CELLS PRESENT ABUNDANT WBC PRESENT,BOTH PMN AND MONONUCLEAR RARE GRAM POSITIVE COCCI RARE GRAM VARIABLE ROD RARE BUDDING YEAST SEEN YEAST WITH PSEUDOHYPHAE Performed at [facility], [place] [address], [place], [place] [address]    Culture RARE  CANDIDA ALBICANS  Final   Report Status 03/02/2022 FINAL  Final  Gastrointestinal Panel by PCR , Stool     Status: None   Collection Time:  03/09/22  6:08 PM   Specimen: Stool  Result Value Ref Range Status   Campylobacter species NOT DETECTED NOT DETECTED Final   Plesimonas shigelloides NOT DETECTED NOT DETECTED Final   Salmonella species NOT DETECTED NOT DETECTED Final   Yersinia enterocolitica NOT DETECTED NOT DETECTED Final   Vibrio species NOT DETECTED NOT DETECTED Final   Vibrio cholerae NOT DETECTED NOT DETECTED Final   Enteroaggregative E coli (EAEC) NOT DETECTED NOT DETECTED Final   Enteropathogenic E coli (EPEC) NOT DETECTED NOT DETECTED Final   Enterotoxigenic E coli (ETEC) NOT DETECTED NOT DETECTED Final   Shiga like toxin producing E coli (STEC) NOT DETECTED NOT DETECTED Final   Shigella/Enteroinvasive E coli (EIEC) NOT DETECTED NOT DETECTED Final   Cryptosporidium NOT DETECTED NOT DETECTED Final   Cyclospora cayetanensis NOT DETECTED NOT DETECTED Final   Entamoeba histolytica NOT DETECTED NOT DETECTED Final   Giardia lamblia NOT DETECTED NOT DETECTED Final   Adenovirus F40/41 NOT DETECTED NOT DETECTED Final   Astrovirus NOT DETECTED NOT DETECTED Final   Norovirus GI/GII NOT DETECTED NOT DETECTED Final   Rotavirus A NOT DETECTED NOT DETECTED Final   Sapovirus (I, II, IV, and V) NOT DETECTED NOT DETECTED Final    Comment: Performed at [facility], [place] [place], [place] [address]  Culture, blood (Routine X 2) w Reflex to ID Panel     Status: None   Collection Time: 03/10/22  6:22 PM   Specimen: BLOOD  Result Value Ref Range Status   Specimen Description BLOOD RIGHT ANTECUBITAL  Final   Special Requests   Final    BOTTLES DRAWN AEROBIC ONLY Blood Culture adequate volume   Culture   Final    NO GROWTH 5 DAYS Performed at [facility], [address] [place], [place] [address]    Report Status 03/15/2022 FINAL  Final  Culture, blood (Routine  X 2) w Reflex to ID Panel     Status: None   Collection Time: 03/10/22  6:32 PM   Specimen: BLOOD  Result Value Ref Range Status   Specimen Description BLOOD BLOOD LEFT HAND  Final   Special Requests   Final    BOTTLES DRAWN AEROBIC AND ANAEROBIC Blood Culture adequate volume   Culture   Final    NO GROWTH 5 DAYS Performed at [facility], [place] [address], [place], [place] [address]    Report Status 03/15/2022 FINAL  Final  Surgical PCR screen     Status: Abnormal   Collection Time: 03/14/22  9:42 PM   Specimen: Nasal Mucosa; Nasal Swab  Result Value Ref Range Status   MRSA, PCR NEGATIVE NEGATIVE Final   Staphylococcus aureus POSITIVE (A) NEGATIVE Final    Comment: (NOTE) The Xpert SA Assay (FDA approved for NASAL specimens in patients 70 years of age and older), is one component of a comprehensive surveillance program. It is not intended to diagnose infection nor to guide or monitor treatment. Performed at [facility], [place] [address], [place], [place] [address]   Aerobic/Anaerobic Culture w Gram Stain (surgical/deep wound)     Status: None   Collection Time: 03/16/22  2:00 PM   Specimen: Skin, Cyst/Tag/Debridement  Result Value Ref Range Status   Specimen Description SKIN  Final   Special Requests NONE  Final   Gram Stain   Final    RARE WBC PRESENT,BOTH PMN AND MONONUCLEAR NO ORGANISMS SEEN    Culture   Final  No growth aerobically or anaerobically. Performed at [facility], [place] [address] [place], [place] [address]    Report Status 03/21/2022 FINAL  Final  Aerobic/Anaerobic Culture w Gram Stain (surgical/deep wound)     Status: None   Collection Time: 03/16/22  3:45 PM   Specimen: PATH GU biopsy; Tissue  Result Value Ref Range Status   Specimen Description SCROTUM  Final   Special Requests TISSUE  Final   Gram Stain   Final    NO WBC SEEN ABUNDANT GRAM POSITIVE COCCI FEW GRAM NEGATIVE RODS    Culture   Final    ABUNDANT ENTEROCOCCUS  FAECALIS FEW PSEUDOMONAS AERUGINOSA MULTI-DRUG RESISTANT ORGANISM ABUNDANT BACTEROIDES OVATUS BETA LACTAMASE POSITIVE CRITICAL RESULT CALLED TO, READ BACK BY AND VERIFIED WITH: PHARMD K PIERCE [id number] AT 21 BY CM Sent to [facility] for further susceptibility testing. SEE REPORT IN EPIC Performed at [facility], [place] [address], [place], [place] [address]    Report Status 04/05/2022 FINAL  Final   Organism ID, Bacteria ENTEROCOCCUS FAECALIS  Final   Organism ID, Bacteria PSEUDOMONAS AERUGINOSA  Final      Susceptibility   Enterococcus faecalis - MIC*    AMPICILLIN <=2 SENSITIVE Sensitive     VANCOMYCIN 1 SENSITIVE Sensitive     GENTAMICIN SYNERGY RESISTANT Resistant     * ABUNDANT ENTEROCOCCUS FAECALIS   Pseudomonas aeruginosa - MIC*    CEFTAZIDIME 16 INTERMEDIATE Intermediate     CIPROFLOXACIN >=4 RESISTANT Resistant     GENTAMICIN 8 INTERMEDIATE Intermediate     IMIPENEM >=16 RESISTANT Resistant     CEFEPIME RESISTANT Resistant     * FEW PSEUDOMONAS AERUGINOSA  Min Inhibitory Conc (2 Drugs)     Status: None   Collection Time: 03/16/22  3:45 PM  Result Value Ref Range Status   Min Inhibitory Conc (2 Drugs) Final report  Corrected    Comment: (NOTE) Performed At: [facility] [employer] [address] [place], [place] [id number] Rush Farmer MD ZH:[id number] CORRECTED ON 05/05 AT 2952: PREVIOUSLY REPORTED AS Preliminary report    Source (MIC2) PSEUDOMONAS AERUGINOSA/ AVYCAZ ZERBAXA/ SCROTUM  Final    Comment: Performed at [facility], [place] [address], [place], [place] [address]  MIC Results (2 Drugs)     Status: None   Collection Time: 03/16/22  3:45 PM  Result Value Ref Range Status   RESULT 5 MIC (RESULT 1)                Base Name: Comment  Final    Comment: (NOTE) Pseudomonas aeruginosa Identification performed by account, not confirmed by this laboratory. AVYCAZ      <=8 UG/ML = SUSCEPTIBLE ZERBAXA     <=4 UG/ML = SUSCEPTIBLE Performed At: [facility] [place], [place] [id number] Rush Farmer MD [id number]     Labs: CBC: Recent Labs  Lab 04/18/22 1446 04/19/22 0418 04/19/22 0539 04/20/22 0501  WBC 9.4 7.2  --  7.3  NEUTROABS 7.2  --   --   --   HGB 7.6* 6.6* 6.8* 7.9*  HCT 23.0* 20.6* 21.0* 24.0*  MCV 93.5 94.9  --  95.2  PLT 246 210  --  956   Basic Metabolic Panel: Recent Labs  Lab 04/15/22 0915 04/18/22 1446 04/18/22 1651 04/19/22 0418  NA  --  138  --  141  K  --  3.1*  --  3.9  CL  --  99  --  104  CO2  --  28  --  27  GLUCOSE  --  108*  --  106*  BUN  --  28*  --  35*  CREATININE  --  4.76*  --  5.95*  CALCIUM  --  8.8*  --  8.8*  MG  --   --  1.9  --   PHOS 4.8*  --   --   --    Liver Function Tests: Recent Labs  Lab 04/18/22 1446  AST 22  ALT 27  ALKPHOS 98  BILITOT 0.9  PROT 7.6  ALBUMIN 3.6   Discharge time spent: greater than 30 minutes.  Signed: Barton Dubois, MD [facility] 04/20/2022

## 2022-04-20 NOTE — Assessment & Plan Note (Signed)
-   Continue as needed analgesics; good response to the use of acetaminophen. -CT head without acute intracranial abnormalities.

## 2022-04-20 NOTE — Progress Notes (Signed)
ANTICOAGULATION CONSULT NOTE -   Pharmacy Consult for Heparin=> eliquis Indication: atrial fibrillation  Allergies  Allergen Reactions   Codeine Hives    Insomnia, jittery   Flomax [Tamsulosin Hcl] Nausea Only    Nausea and fatigue    Patient Measurements: Height: '5\' 10"'$  (177.8 cm) Weight: 86.2 kg (190 lb) IBW/kg (Calculated) : 73 HEPARIN DW (KG): 86.2   Vital Signs: Temp: 98.5 F (36.9 C) (05/29 0504) Temp Source: Oral (05/29 0504) BP: 121/65 (05/29 0504) Pulse Rate: 104 (05/29 0504)  Labs: Recent Labs    04/18/22 1446 04/18/22 2313 04/19/22 0418 04/19/22 0539 04/19/22 0858 04/19/22 2055 04/20/22 0501  HGB 7.6*  --  6.6* 6.8*  --   --  7.9*  HCT 23.0*  --  20.6* 21.0*  --   --  24.0*  PLT 246  --  210  --   --   --  216  APTT 36  --   --   --   --   --   --   HEPARINUNFRC  --    < >  --   --  0.21* 0.21* 0.26*  CREATININE 4.76*  --  5.95*  --   --   --   --    < > = values in this interval not displayed.     Estimated Creatinine Clearance: 13.5 mL/min (A) (by C-G formula based on SCr of 5.95 mg/dL (H)).   Medical History: Past Medical History:  Diagnosis Date   Concussion    Eosinophilic esophagitis    EGD/bx by Dr. Havery Moros   Gastric ulcer    History of kidney stones    Intermittent dysphagia    Migraine     Medications:  See med rec  Assessment: 61 yo  patient presents to the ED for concern of headache as well as  atrial fibrillation with rapid ventricular rate and incomplete dialysis session. Patient not on oral anticoagulation.  Pharmacy asked to start heparin Hgb 7.9, stable. Transfused 1 unit 04/19/22 To transition to eliquis this morning  Goal of Therapy:  Heparin level 0.3-0.7 units/ml Monitor platelets by anticoagulation protocol: Yes   Plan:  D/C Heparin Eliquis '5mg'$  po bid Educate on eliquis Continue to monitor H&H and platelets  Isac Sarna, BS Pharm D, BCPS Clinical Pharmacist 04/20/2022,7:32 AM

## 2022-04-21 DIAGNOSIS — N178 Other acute kidney failure: Secondary | ICD-10-CM | POA: Diagnosis not present

## 2022-04-21 DIAGNOSIS — N2581 Secondary hyperparathyroidism of renal origin: Secondary | ICD-10-CM | POA: Diagnosis not present

## 2022-04-21 DIAGNOSIS — I4891 Unspecified atrial fibrillation: Secondary | ICD-10-CM | POA: Diagnosis not present

## 2022-04-21 DIAGNOSIS — Z992 Dependence on renal dialysis: Secondary | ICD-10-CM | POA: Diagnosis not present

## 2022-04-21 DIAGNOSIS — N186 End stage renal disease: Secondary | ICD-10-CM | POA: Diagnosis not present

## 2022-04-21 DIAGNOSIS — N179 Acute kidney failure, unspecified: Secondary | ICD-10-CM | POA: Diagnosis not present

## 2022-04-21 DIAGNOSIS — N492 Inflammatory disorders of scrotum: Secondary | ICD-10-CM | POA: Diagnosis not present

## 2022-04-22 ENCOUNTER — Other Ambulatory Visit: Payer: Self-pay

## 2022-04-22 ENCOUNTER — Ambulatory Visit: Payer: BC Managed Care – PPO | Admitting: Rehabilitation

## 2022-04-22 ENCOUNTER — Telehealth: Payer: Self-pay

## 2022-04-22 MED ORDER — APIXABAN 5 MG PO TABS: 5.0000 mg | ORAL_TABLET | Freq: Two times a day (BID) | ORAL | 2 refills | Status: DC

## 2022-04-22 NOTE — Telephone Encounter (Signed)
Per Dr. Audie Box, ok to send 90 supply of Eliquis to pt pharmacy.

## 2022-04-23 DIAGNOSIS — N178 Other acute kidney failure: Secondary | ICD-10-CM | POA: Diagnosis not present

## 2022-04-23 DIAGNOSIS — Z992 Dependence on renal dialysis: Secondary | ICD-10-CM | POA: Diagnosis not present

## 2022-04-23 DIAGNOSIS — N2581 Secondary hyperparathyroidism of renal origin: Secondary | ICD-10-CM | POA: Diagnosis not present

## 2022-04-25 DIAGNOSIS — N2581 Secondary hyperparathyroidism of renal origin: Secondary | ICD-10-CM | POA: Diagnosis not present

## 2022-04-25 DIAGNOSIS — Z992 Dependence on renal dialysis: Secondary | ICD-10-CM | POA: Diagnosis not present

## 2022-04-25 DIAGNOSIS — N178 Other acute kidney failure: Secondary | ICD-10-CM | POA: Diagnosis not present

## 2022-04-26 NOTE — Progress Notes (Unsigned)
Cardiology Office Note:   Date:  04/27/2022  NAME:  Jeffrey Ayala    MRN: [id number] DOB:  1961-01-22   PCP:  Jerline Pain, MD  Cardiologist:  Evalina Field, MD  Electrophysiologist:  None   Referring MD: No ref. provider found   Chief Complaint  Patient presents with   Follow-up        History of Present Illness:   Jeffrey Ayala is a 61 y.o. male with a hx of pAF, AKI on HD, cardiac arrest/septic shock who presents for follow-up.  He had an extensive hospitalization between 02/22/2022 and 04/03/2022.  This apparently started with an infection that led to sepsis and cardiac arrest.  He then suffered acute renal failure requiring hemodialysis.  He remains on hemodialysis.  He suffered gangrene of his lower extremities that required amputation.  He then completed a course of rehab and inpatient rehab and is now out.  His course has been complicated by atrial fibrillation.  Apparently rate control was recommended.  There has been some discrepancy in his chads vascular score.  On my review it appears to be 2.  Regardless he was not recommended to be on anticoagulation due to ongoing anemia requiring recurrent transfusion.  He was recently admitted to [facility] in late May for clotting of his dialysis catheter.  There were concern for possible thromboembolic event and sent him to [facility].  There he was found to be very anemic.  He was started on anticoagulation and transfused.  His echocardiogram in the hospital was normal.  He reports no chest pain or trouble breathing.  He is not tolerating dialysis well at all.  Reports nausea and just not feeling well.  Other than that he is without complaints.  Still making urine.  Hopefully his renal failure will resolve.  His anemia does bother me.  He did receive blood transfusion at [facility].  He is on Eliquis.  This is problematic.  I discussed rechecking his lab work to make sure his hemoglobin values are normal.  His EKG demonstrates  A-fib with a heart rate of 110.  He is unaware of his atrial fibrillation.  Denies any chest pain or trouble breathing.  He did have troponin elevation in the hospital but this was thought to be demand.  His echocardiogram showed normal LV function with no wall motion abnormality.  No history of smoking.  Reports alcohol use in moderation.  No drug use.  There is no strong family history of heart disease.  He reports his mother had atrial fibrillation.  No bleeding.  Has not had an EGD or colonoscopy recently.  I did recommend this.  We also discussed possibly of cardioversion but he needs to remain stable regarding his anemia.  Problem List Cardiac Arrest -2/2 septic shock 02/22/2022-04/03/2022 -scrotal infection  -2/2 troponin elevation, normal echo 2. AKI on HD -TTS perm cath  3. LE gangrene -s/p L transmetatarsal amputation, R BKA -2/2 pressors from shock  4. Paroxysmal Afib -CHADSVASC=2 (CAD, HTN)   Past Medical History: Past Medical History:  Diagnosis Date   Concussion    Eosinophilic esophagitis    EGD/bx by Dr. Havery Moros   Gastric ulcer    History of kidney stones    Intermittent dysphagia    Migraine     Past Surgical History: Past Surgical History:  Procedure Laterality Date   AMPUTATION Right 03/15/2022   Procedure: RIGHT BELOW KNEE AMPUTATION;  Surgeon: [name], MD;  Location:  [place] OR;  Service: Orthopedics;  Laterality: Right;   CYSTOSCOPY     DENTAL SURGERY     FINGER SURGERY     HERNIA REPAIR     at age 84   IR FLUORO GUIDE CV LINE RIGHT  03/09/2022   IR PATIENT EVAL TECH 0-60 MINS  03/09/2022   IR US GUIDE VASC ACCESS RIGHT  03/09/2022   KNEE ARTHROSCOPY Left    LITHOTRIPSY     NEPHROLITHOTOMY     SCROTAL EXPLORATION N/A 03/16/2022   Procedure: IRRIGATION AND DEBRIDEMENT SCROTUM;  Surgeon: Raynelle Bring, MD;  Location: [place];  Service: Urology;  Laterality: N/A;   SCROTAL EXPLORATION N/A 03/28/2022   Procedure: IRRIGATION AND DEBRIDEMENT SCROTUM;  Surgeon:  Ardis Hughs, MD;  Location: [place];  Service: Urology;  Laterality: N/A;   TONSILLECTOMY     TOTAL KNEE ARTHROPLASTY Bilateral 11/04/2018   Procedure: BILATERAL TOTAL KNEE ARTHROPLASTY;  Surgeon: [name], MD;  Location: [place];  Service: Orthopedics;  Laterality: Bilateral;  spinal/epidural per anesthesiologist   TRANSMETATARSAL AMPUTATION Left 03/15/2022   Procedure: LEFT TRANSMETATARSAL AMPUTATION;  Surgeon: [name], MD;  Location: [place];  Service: Orthopedics;  Laterality: Left;    Current Medications: No outpatient medications have been marked as taking for the 04/27/22 encounter (Office Visit) with Audie Box, Cassie Freer, MD.     Allergies:    Codeine and Flomax [tamsulosin hcl]   Social History: Social History   Socioeconomic History   Marital status: Married    Spouse name: Not on file   Number of children: 2   Years of education: Not on file   Highest education level: Not on file  Occupational History   Occupation: Press photographer - Clinical cytogeneticist  Tobacco Use   Smoking status: Never   Smokeless tobacco: Never  Vaping Use   Vaping Use: Never used  Substance and Sexual Activity   Alcohol use: Not Currently    Comment: occassional   Drug use: No   Sexual activity: Not Currently  Other Topics Concern   Not on file  Social History Narrative   Not on file   Social Determinants of Health   Financial Resource Strain: Not on file  Food Insecurity: Not on file  Transportation Needs: Not on file  Physical Activity: Not on file  Stress: Not on file  Social Connections: Not on file     Family History: The patient's family history includes Arrhythmia in his mother; COPD in his mother; Cancer in his brother; Sarcoidosis in his brother. There is no history of Stomach cancer, Pancreatic cancer, Esophageal cancer, Colon cancer, or Rectal cancer.  ROS:   All other ROS reviewed and negative. Pertinent positives noted in the HPI.     EKGs/Labs/Other Studies  Reviewed:   The following studies were personally reviewed by me today:  EKG:  EKG is ordered today.  The ekg ordered today demonstrates A-fib heart rate 110, no acute ischemic changes or evidence of infarct, and was personally reviewed by me.   TTE 02/23/2022  1. Left ventricular ejection fraction, by estimation, is 60 to 65%. The  left ventricle has normal function. The left ventricle has no regional  wall motion abnormalities. There is mild left ventricular hypertrophy.  Left ventricular diastolic function  could not be evaluated.   2. Right ventricular systolic function is normal. The right ventricular  size is normal.   3. The mitral valve is normal in structure. No evidence of mitral valve  regurgitation.  No evidence of mitral stenosis.   4. The aortic valve is tricuspid. Aortic valve regurgitation is not  visualized. No aortic stenosis is present.   5. The inferior vena cava is dilated in size with <50% respiratory  variability, suggesting right atrial pressure of 15 mmHg.   Recent Labs: 02/22/2022: B Natriuretic Peptide 307.0; TSH 0.687 04/18/2022: ALT 27; Magnesium 1.9 04/19/2022: BUN 35; Creatinine, Ser 5.95; Potassium 3.9; Sodium 141 04/20/2022: Hemoglobin 7.9; Platelets 216   Recent Lipid Panel    Component Value Date/Time   TRIG 159 (H) 03/17/2022 0754    Physical Exam:   VS:  BP 112/73   Pulse (!) 110   Ht '5\' 10"'$  (1.778 m)   Wt 190 lb (86.2 kg)   SpO2 100%   BMI 27.26 kg/m    Wt Readings from Last 3 Encounters:  04/27/22 190 lb (86.2 kg)  04/27/22 190 lb (86.2 kg)  04/18/22 190 lb (86.2 kg)    General: Well nourished, well developed, in no acute distress Head: Atraumatic, normal size  Eyes: PEERLA, EOMI  Neck: Supple, no JVD Endocrine: No thryomegaly Cardiac: Normal S1, S2; RRR; no murmurs, rubs, or gallops Lungs: Clear to auscultation bilaterally, no wheezing, rhonchi or rales  Abd: Soft, nontender, no hepatomegaly  Ext: Right BKA, left transmetatarsal  amputation Musculoskeletal: No deformities, BUE and BLE strength normal and equal Skin: Warm and dry, no rashes   Neuro: Alert and oriented to person, place, time, and situation, CNII-XII grossly intact, no focal deficits  Psych: Normal mood and affect   ASSESSMENT:   Jeffrey Ayala is a 61 y.o. male who presents for the following: 1. Paroxysmal atrial fibrillation ([name])     PLAN:   1. Paroxysmal atrial fibrillation ([name]) -He developed atrial fibrillation in the setting of septic shock.  Suffered cardiac arrest as well.  Troponins were elevated but echo showed normal LV function with no wall motion abnormality.  This was likely a secondary process.  He is now on hemodialysis due to acute kidney injury in the setting of septic shock.  He also suffered dry gangrene of the lower extremity status post right BKA and left transmetatarsal amputation. -He remains in A-fib today on EKG.  Recent thyroid studies were normal.  Echocardiogram was also normal.  No murmurs on exam. -Suspect critical illness triggered his A-fib.  CHA2DS2-VASc equals 2.  Likely related to troponin elevation as well as hypertension. -He is now on hemodialysis and has ongoing anemia requiring transfusions.  He actually was admitted to the hospital [facility] underwent a transfusion as well as started on Eliquis.  I am a bit hesitant to continue with blood thinner in the setting of ongoing anemia requiring transfusions.  Clearly this can get him into a rough spot.  We will recheck a CBC today.  As long as values remain stable we will continue on anticoagulation.  If values continue to decline he will need to stop this.  We discussed that I would like to attempt cardioversion again back in normal rhythm given his first episode of atrial fibrillation however his ability to tolerate uninterrupted anticoagulation is of concern in the setting of ongoing anemia and multiple transfusions.  For now we will continue Eliquis and recheck labs.   He will see me back in 2 months.  As long as we can show stability in his blood counts I believe he can stay on Eliquis and be considered for cardioversion.  I do think he merits EGD and colonoscopy  and he will discuss this with his primary care physician.  We need to make sure there is no bleeding that will cause problems only pursue possible cardioversion. -For now we will continue with rate control strategy.  Blood pressure is a bit low.  They report is low with dialysis.  We will reduce metoprolol to 12.5 mg twice daily.  He can hold this on dialysis days.  Unfortunately not a candidate for digoxin given acute renal failure.  He has limited options for rate control strategy.  Again cardioversion would be ideal however we are dealing with ongoing anemia.  This must be addressed. -For his troponin elevation he describes no symptoms of chest pain.  For now we will forego any testing.  Again anemia is a big concern.  Given that his LV function is normal I believe this was a secondary process to critical illness.   Disposition: Return in about 2 months (around 06/27/2022).  Medication Adjustments/Labs and Tests Ordered: Current medicines are reviewed at length with the patient today.  Concerns regarding medicines are outlined above.  Orders Placed This Encounter  Procedures   CBC   EKG 12-Lead   Meds ordered this encounter  Medications   metoprolol tartrate (LOPRESSOR) 25 MG tablet    Sig: Take 0.5 tablets (12.5 mg total) by mouth 2 (two) times daily.    Dispense:  90 tablet    Refill:  3    Patient Instructions  Medication Instructions:  DECREASE: METOPROLOL TO 12.'5mg'$  TWICE DAILY  *If you need a refill on your cardiac medications before your next appointment, please call your pharmacy*  Lab Work: CBC TODAY  If you have labs (blood work) drawn today and your tests are completely normal, you will receive your results only by: [place] (if you have MyChart) OR A paper copy in the  mail If you have any lab test that is abnormal or we need to change your treatment, we will call you to review the results.  Follow-Up: At [facility], you and your health needs are our priority.  As part of our continuing mission to provide you with exceptional heart care, we have created designated Provider Care Teams.  These Care Teams include your primary Cardiologist (physician) and Advanced Practice Providers (APPs -  Physician Assistants and Nurse Practitioners) who all work together to provide you with the care you need, when you need it.  Your next appointment:   2 month(s)  The format for your next appointment:   In Person  Provider:   Evalina Field, MD           Time Spent with Patient: I have spent a total of 35 minutes with patient reviewing hospital notes, telemetry, EKGs, labs and examining the patient as well as establishing an assessment and plan that was discussed with the patient.  > 50% of time was spent in direct patient care.  Signed, Addison Naegeli. Audie Box, MD, [place]  [address] [place], [place] [address] [phone]  04/27/2022 2:23 PM

## 2022-04-27 ENCOUNTER — Ambulatory Visit (INDEPENDENT_AMBULATORY_CARE_PROVIDER_SITE_OTHER): Payer: BC Managed Care – PPO | Admitting: Cardiovascular Disease

## 2022-04-27 ENCOUNTER — Ambulatory Visit (INDEPENDENT_AMBULATORY_CARE_PROVIDER_SITE_OTHER): Payer: BC Managed Care – PPO | Admitting: Internal Medicine

## 2022-04-27 ENCOUNTER — Other Ambulatory Visit: Payer: Self-pay

## 2022-04-27 ENCOUNTER — Encounter: Payer: Self-pay | Admitting: Cardiovascular Disease

## 2022-04-27 ENCOUNTER — Encounter: Payer: Self-pay | Admitting: Internal Medicine

## 2022-04-27 VITALS — BP 127/79 | HR 96 | Temp 97.9°F | Ht 70.0 in | Wt 190.0 lb

## 2022-04-27 VITALS — BP 112/73 | HR 110 | Ht 70.0 in | Wt 190.0 lb

## 2022-04-27 DIAGNOSIS — Z8619 Personal history of other infectious and parasitic diseases: Secondary | ICD-10-CM | POA: Diagnosis not present

## 2022-04-27 DIAGNOSIS — N493 Fournier gangrene: Secondary | ICD-10-CM | POA: Diagnosis not present

## 2022-04-27 DIAGNOSIS — I480 Paroxysmal atrial fibrillation: Secondary | ICD-10-CM | POA: Diagnosis not present

## 2022-04-27 MED ORDER — METOPROLOL TARTRATE 25 MG PO TABS: 12.5000 mg | ORAL_TABLET | Freq: Two times a day (BID) | ORAL | 3 refills | Status: DC

## 2022-04-27 NOTE — Patient Instructions (Signed)
Medication Instructions:  DECREASE: METOPROLOL TO 12.'5mg'$  TWICE DAILY  *If you need a refill on your cardiac medications before your next appointment, please call your pharmacy*  Lab Work: CBC TODAY  If you have labs (blood work) drawn today and your tests are completely normal, you will receive your results only by: [place] (if you have MyChart) OR A paper copy in the mail If you have any lab test that is abnormal or we need to change your treatment, we will call you to review the results.  Follow-Up: At [facility], you and your health needs are our priority.  As part of our continuing mission to provide you with exceptional heart care, we have created designated Provider Care Teams.  These Care Teams include your primary Cardiologist (physician) and Advanced Practice Providers (APPs -  Physician Assistants and Nurse Practitioners) who all work together to provide you with the care you need, when you need it.  Your next appointment:   2 month(s)  The format for your next appointment:   In Person  Provider:   Evalina Field, MD

## 2022-04-27 NOTE — Progress Notes (Signed)
[place] [facility] for Infectious Disease  Patient Active Problem List   Diagnosis Date Noted   S/P transmetatarsal amputation of foot, left ([place]) 04/18/2022   CKD (chronic kidney disease) stage 5, GFR less than 15 ml/min ([name]) 04/18/2022   Arthralgia of right lower leg    Acute blood loss anemia    Unilateral complete BKA, right, initial encounter ([place]) 04/03/2022   S/P BKA (below knee amputation), right ([place]) 04/03/2022   Cirrhosis ([place]) 03/31/2022   Fournier's gangrene in male    Scrotal infection 03/25/2022   Anemia 03/25/2022   Diarrhea 03/25/2022   Shock liver 03/25/2022   Acute respiratory failure with hypoxia ([place]) 03/25/2022   Gout 03/25/2022   Acute nonintractable headache 03/25/2022   Demand ischemia ([place]) 03/25/2022   Thrombocytopenia ([place]) 03/25/2022   Obesity, Class III, BMI 40-49.9 (morbid obesity) ([place]) 03/25/2022   Cardiac arrest ([place]) 03/25/2022   Hypotension    Respiratory insufficiency    Cellulitis    Malnutrition of moderate degree 03/12/2022   Altered mental status    Gangrene of right foot ([place])    Gangrene of left foot ([place])    Severe protein-calorie malnutrition ([place]) 03/10/2022   Encounter for central line placement    Pressure injury of skin 02/23/2022   Septic shock ([place]) 02/22/2022   AKI (acute kidney injury) ([place])    Generalized abdominal pain    Lactic acidosis    Atrial fibrillation with RVR ([name])    Hypokalemia    Hypomagnesemia    S/P TKR (total knee replacement), bilateral 11/04/2018   Bilateral primary osteoarthritis of knee    Rupture of medial head of gastrocnemius, left, sequela 01/14/2017   Kidney stones 06/22/2016   Essential hypertension 08/01/2010   DYSTHYMIC DISORDER 07/08/2010   PALPITATIONS 07/08/2010      Subjective:    Patient ID: Jeffrey Ayala, male    DOB: [birth year], 61 y.o.   MRN: [id number]  Chief Complaint  Patient presents with   Hospitalization Follow-up    HPI:  Jeffrey Ayala is a 61  y.o. male cirrhosis, esrd on iHD, afib, here for hospital follow up of fournier's gangrene  Patient admitted 02/23/2022 with septic shock, scrotal ssi and underwent several I&D last one on 5/06 by urology with closure of wound Cx include mdr pseudomonas and enterococcus and bacteroides He was ultimately transitioned to zyrbaxa and amox-clav to finish another rweek beyond 5/06  Crrt was started during admission. He didn't need dialysis before that   He also had dry gangrene bilateral feet s/p bilateral amputation without sign of infection.    He was then admitted to inpatient rehab until 5/24. He then had another admission 5/27-29 for afib rvr. There has been persistent drainage from scrotal wound but no sign of infection otherwise. He was last seen during inpatient rehab by urology on 5/23 who is happy with wound and aware about the serous drainage   He'll see urology this Wednesday   His appetite is not the best; not loosing weight He dialyses now via right chest catheter  His right bka and left tma stumps incision had closed   No fever chill  He is not on any antibiotics   Some nausea with iHD  Allergies  Allergen Reactions   Codeine Hives    Insomnia, jittery   Flomax [Tamsulosin Hcl] Nausea Only    Nausea and fatigue      Outpatient Medications Prior to Visit  Medication Sig Dispense Refill  acetaminophen (TYLENOL) 650 MG CR tablet Take 650 mg by mouth every 8 (eight) hours as needed for pain or fever.     allopurinol (ZYLOPRIM) 100 MG tablet Take 1 tablet (100 mg total) by mouth daily. 30 tablet 0   apixaban (ELIQUIS) 5 MG TABS tablet Take 1 tablet (5 mg total) by mouth 2 (two) times daily. 60 tablet 2   ascorbic acid (VITAMIN C) 1000 MG tablet Take 1 tablet (1,000 mg total) by mouth daily. 30 tablet 0   folic acid (FOLVITE) 1 MG tablet Take 1 tablet (1 mg total) by mouth daily. 30 tablet 0   lanthanum (FOSRENOL) 1000 MG chewable tablet Chew 1 tablet (1,000 mg  total) by mouth 3 (three) times daily with meals. 90 tablet 0   metoprolol tartrate (LOPRESSOR) 25 MG tablet Take 1 tablet (25 mg total) by mouth 2 (two) times daily. 60 tablet 2   multivitamin (RENA-VIT) TABS tablet Take 1 tablet by mouth at bedtime. 30 tablet 0   pantoprazole (PROTONIX) 40 MG tablet Take 1 tablet (40 mg total) by mouth daily. 30 tablet 1   QUEtiapine (SEROQUEL) 25 MG tablet Take 1 tablet (25 mg total) by mouth at bedtime. (Patient taking differently: Take 12.5 mg by mouth at bedtime.) 30 tablet 0   simethicone (MYLICON) 80 MG chewable tablet Chew 1 tablet (80 mg total) by mouth 4 (four) times daily as needed for flatulence. 30 tablet 0   traZODone (DESYREL) 50 MG tablet Take 0.5-1 tablets (25-50 mg total) by mouth at bedtime as needed for sleep. 30 tablet 0   hydrOXYzine (ATARAX) 10 MG/5ML syrup Take 12.5 mLs (25 mg total) by mouth 2 (two) times daily as needed for itching. 240 mL 0   leptospermum manuka honey (MEDIHONEY) PSTE paste Apply 1 application. topically daily. To elbow wound, cover with damp to dry dressing. Change daily. (Patient not taking: Reported on 04/27/2022) 44 mL 2   melatonin 5 MG TABS Take 1 tablet (5 mg total) by mouth at bedtime. 30 tablet 0   oxyCODONE (OXY IR/ROXICODONE) 5 MG immediate release tablet Take 1 tablet (5 mg total) by mouth daily as needed for severe pain. 7 tablet 0   No facility-administered medications prior to visit.     Social History   Socioeconomic History   Marital status: Married    Spouse name: Not on file   Number of children: Not on file   Years of education: Not on file   Highest education level: Not on file  Occupational History   Not on file  Tobacco Use   Smoking status: Never   Smokeless tobacco: Never  Vaping Use   Vaping Use: Never used  Substance and Sexual Activity   Alcohol use: Not Currently    Comment: occassional   Drug use: No   Sexual activity: Not Currently  Other Topics Concern   Not on file   Social History Narrative   Not on file   Social Determinants of Health   Financial Resource Strain: Not on file  Food Insecurity: Not on file  Transportation Needs: Not on file  Physical Activity: Not on file  Stress: Not on file  Social Connections: Not on file  Intimate Partner Violence: Not on file      Review of Systems    All other ros negative    Objective:    BP 127/79   Pulse 96 Comment: Hx of Afib  Temp 97.9 F (36.6 C)   Ht '5\' 10"'$  (  1.778 m)   Wt 190 lb (86.2 kg) Comment: Weight taken at dialysis on Saturday  SpO2 100%   BMI 27.26 kg/m  Nursing note and vital signs reviewed.  Physical Exam     General/constitutional: no distress, pleasant; in wheel chair; conversant HEENT: Normocephalic, PER, Conj Clear, EOMI, Oropharynx clear Neck supple CV: rrr no mrg Lungs: clear to auscultation, normal respiratory effort Abd: Soft, Nontender Ext: no edema Skin/gu-- no rash; left scrotum sutures intact; on diaper I see slight yellow/straw color dry discharge; no swelling/redness/tenderness or active discharge from scrotum Neuro: nonfocal MSK: s/p left tma and right bka   Central line presence: right chest catheter site no erythema/purulence   Labs: Lab Results  Component Value Date   WBC 7.3 04/20/2022   HGB 7.9 (L) 04/20/2022   HCT 24.0 (L) 04/20/2022   MCV 95.2 04/20/2022   PLT 216 24/23/5361   Last metabolic panel Lab Results  Component Value Date   GLUCOSE 106 (H) 04/19/2022   NA 141 04/19/2022   K 3.9 04/19/2022   CL 104 04/19/2022   CO2 27 04/19/2022   BUN 35 (H) 04/19/2022   CREATININE 5.95 (H) 04/19/2022   GFRNONAA 10 (L) 04/19/2022   CALCIUM 8.8 (L) 04/19/2022   PHOS 4.8 (H) 04/15/2022   PROT 7.6 04/18/2022   ALBUMIN 3.6 04/18/2022   BILITOT 0.9 04/18/2022   ALKPHOS 98 04/18/2022   AST 22 04/18/2022   ALT 27 04/18/2022   ANIONGAP 10 04/19/2022    Micro:  Serology:  Imaging:  Assessment & Plan:   Problem List Items  Addressed This Visit       Musculoskeletal and Integument   Fournier's gangrene in male - Primary   Other Visit Diagnoses     History of MDR Pseudomonas aeruginosa infection           Patient s/p several I&D and closure by 5/06 and had appropriate course of zerbaxa/aumentin by 5/13 for mds pseudomonas along with enterococcus/bacteroides polymicrobial infection  Esrd from shock still on iHD  Pvd s/p right bka and left tma for dry gangrene no issue  Cirrhosis  Afib-rvr on elaquis   Clinically today the scrotal area looked to be without infection. The persistent serosanguinous discharge is in setting of cirrhosis/esrd/elaquis   F/u with urology this week If sign/sx of infection (fever, chill, redness in scrotal area with pain/purulence) have to go to ED given the resistant nature of pseduomonas no oral option abx available   F/u with id clinic as needed otherwise     Follow-up: Return if symptoms worsen or fail to improve.      Jabier Mutton, [facility] Infectious Disease [facility] 04/27/2022, 11:14 AM

## 2022-04-27 NOTE — Patient Instructions (Addendum)
    You can call your insurance company to ask about in-network counseling resources. Your PCP may also have suggestions for specific providers.    Follow up with urology this week If sign/sx of infection (fever, chill, redness in scrotal area with pain/purulence) have to go to ED given the resistant nature of pseduomonas no oral option abx available   Call our clinic if you have any other concern about infection otherwise

## 2022-04-28 LAB — CBC
Hematocrit: 25.2 % — ABNORMAL LOW (ref 37.5–51.0)
Hemoglobin: 8.3 g/dL — ABNORMAL LOW (ref 13.0–17.7)
MCH: 30.9 pg (ref 26.6–33.0)
MCHC: 32.9 g/dL (ref 31.5–35.7)
MCV: 94 fL (ref 79–97)
Platelets: 281 10*3/uL (ref 150–450)
RBC: 2.69 x10E6/uL — CL (ref 4.14–5.80)
RDW: 18.8 % — ABNORMAL HIGH (ref 11.6–15.4)
WBC: 7.7 10*3/uL (ref 3.4–10.8)

## 2022-04-29 ENCOUNTER — Ambulatory Visit: Payer: BC Managed Care – PPO | Admitting: General Practice

## 2022-04-29 ENCOUNTER — Encounter: Payer: BC Managed Care – PPO | Admitting: Rehabilitation

## 2022-04-29 DIAGNOSIS — T8249XA Other complication of vascular dialysis catheter, initial encounter: Secondary | ICD-10-CM | POA: Diagnosis not present

## 2022-04-29 DIAGNOSIS — Z992 Dependence on renal dialysis: Secondary | ICD-10-CM | POA: Diagnosis not present

## 2022-04-29 DIAGNOSIS — N492 Inflammatory disorders of scrotum: Secondary | ICD-10-CM | POA: Diagnosis not present

## 2022-04-29 DIAGNOSIS — N186 End stage renal disease: Secondary | ICD-10-CM | POA: Diagnosis not present

## 2022-04-30 DIAGNOSIS — Z992 Dependence on renal dialysis: Secondary | ICD-10-CM | POA: Diagnosis not present

## 2022-04-30 DIAGNOSIS — N179 Acute kidney failure, unspecified: Secondary | ICD-10-CM | POA: Diagnosis not present

## 2022-04-30 DIAGNOSIS — N178 Other acute kidney failure: Secondary | ICD-10-CM | POA: Diagnosis not present

## 2022-04-30 DIAGNOSIS — N2581 Secondary hyperparathyroidism of renal origin: Secondary | ICD-10-CM | POA: Diagnosis not present

## 2022-05-02 DIAGNOSIS — N2581 Secondary hyperparathyroidism of renal origin: Secondary | ICD-10-CM | POA: Diagnosis not present

## 2022-05-02 DIAGNOSIS — N178 Other acute kidney failure: Secondary | ICD-10-CM | POA: Diagnosis not present

## 2022-05-02 DIAGNOSIS — Z992 Dependence on renal dialysis: Secondary | ICD-10-CM | POA: Diagnosis not present

## 2022-05-04 ENCOUNTER — Ambulatory Visit (INDEPENDENT_AMBULATORY_CARE_PROVIDER_SITE_OTHER): Payer: BC Managed Care – PPO | Admitting: Orthopedic Surgery

## 2022-05-04 DIAGNOSIS — Z89432 Acquired absence of left foot: Secondary | ICD-10-CM

## 2022-05-04 DIAGNOSIS — Z89511 Acquired absence of right leg below knee: Secondary | ICD-10-CM

## 2022-05-05 ENCOUNTER — Encounter: Payer: Self-pay | Admitting: Orthopedic Surgery

## 2022-05-05 DIAGNOSIS — Z992 Dependence on renal dialysis: Secondary | ICD-10-CM | POA: Diagnosis not present

## 2022-05-05 DIAGNOSIS — N178 Other acute kidney failure: Secondary | ICD-10-CM | POA: Diagnosis not present

## 2022-05-05 DIAGNOSIS — N2581 Secondary hyperparathyroidism of renal origin: Secondary | ICD-10-CM | POA: Diagnosis not present

## 2022-05-05 NOTE — Therapy (Signed)
  Patient Name: Jeffrey Ayala MRN: [id number] DOB:[birth year], 61 y.o., male Today's Date: 05/06/2022  Pt present with wife in wheelchair. Wife and pt reports that they have not receivd prosthetic leg for R LE and spacer for L LE yet. They have been fitted for spacer a week ago and will be fitted for prosthetic leg next week. Pt and wife educated that we cannot initiate OP Physical therapy until they have received prosthetic leg and spacer. Educated them both on bringing prosthetic leg, spacer and shoes on their evalutaion. Educated them both to bring ply socks on their first evaluation. Educated them both to call us as soon as they have received their prosthetic leg and spacer to schedule for the physical therapy evaluation.    Kerrie Pleasure, PT 05/06/2022, 9:00 AM

## 2022-05-05 NOTE — Progress Notes (Signed)
Office Visit Note   Patient: Jeffrey Ayala           Date of Birth: 12-23-1960           MRN: [id number] Visit Date: 05/04/2022              Requested by: No referring provider defined for this encounter. PCP: Jerline Pain, MD  Chief Complaint  Patient presents with   Left Foot - Routine Post Op    03/15/22 left transmet amputation   Right Leg - Routine Post Op    03/15/22 Right BKA      HPI: Patient is a 61 year old gentleman who is status post a left transmetatarsal amputation and status post a right transtibial amputation.  Assessment & Plan: Visit Diagnoses:  1. History of transmetatarsal amputation of left foot ([place])   2. Hx of below knee amputation, right ([place])     Plan: Patient will follow-up with [facility] for a right below-knee amputation prosthesis and a left transmetatarsal spacer orthotic carbon plate and extra-depth shoe.  We reviewed the importance of working on Achilles stretching on the left.  We will proceed with physical therapy once he obtains his prosthesis.  Follow-Up Instructions: Return in about 2 months (around 07/04/2022).   Ortho Exam  Patient is alert, oriented, no adenopathy, well-dressed, normal affect, normal respiratory effort. Examination of both extremities the incisions are well-healed he has full extension of the right knee left foot is developing some Achilles tightness.  Patient was given instructions and demonstrated Achilles stretching.  Patient is a new right transtibial  amputee.  Patient's current comorbidities are not expected to impact the ability to function with the prescribed prosthesis. Patient verbally communicates a strong desire to use a prosthesis. Patient currently requires mobility aids to ambulate without a prosthesis.  Expects not to use mobility aids with a new prosthesis.  Patient is a K3 level ambulator that spends a lot of time walking around on uneven terrain over obstacles, up and down stairs, and ambulates  with a variable cadence.     Imaging: No results found.    Labs: Lab Results  Component Value Date   HGBA1C 5.5 02/23/2022   ESRSEDRATE 20 (H) 02/22/2022   CRP 13.8 (H) 02/23/2022   LABURIC 6.3 11/14/2018   LABURIC 7.4 04/10/2014   REPTSTATUS 04/05/2022 FINAL 03/16/2022   GRAMSTAIN  03/16/2022    NO WBC SEEN ABUNDANT GRAM POSITIVE COCCI FEW GRAM NEGATIVE RODS    CULT  03/16/2022    ABUNDANT ENTEROCOCCUS FAECALIS FEW PSEUDOMONAS AERUGINOSA MULTI-DRUG RESISTANT ORGANISM ABUNDANT BACTEROIDES OVATUS BETA LACTAMASE POSITIVE CRITICAL RESULT CALLED TO, READ BACK BY AND VERIFIED WITH: PHARMD K PIERCE [id number] AT 1019 BY CM Sent to [place] for further susceptibility testing. SEE REPORT IN EPIC Performed at [facility], [place] [address] [place], [place] [address]    LABORGA ENTEROCOCCUS FAECALIS 03/16/2022   LABORGA PSEUDOMONAS AERUGINOSA 03/16/2022     Lab Results  Component Value Date   ALBUMIN 3.6 04/18/2022   ALBUMIN 2.6 (L) 04/06/2022   ALBUMIN 2.6 (L) 04/04/2022    Lab Results  Component Value Date   MG 1.9 04/18/2022   MG 2.1 04/01/2022   MG 2.0 03/31/2022   No results found for: "VD25OH"  No results found for: "PREALBUMIN"    Latest Ref Rng & Units 04/27/2022    2:25 PM 04/20/2022    5:01 AM 04/19/2022    5:39 AM  CBC EXTENDED  WBC 3.4 - 10.8  x10E3/uL 7.7  7.3    RBC 4.14 - 5.80 x10E6/uL 2.69  2.52    Hemoglobin 13.0 - 17.7 g/dL 8.3  7.9  6.8   HCT 37.5 - 51.0 % 25.2  24.0  21.0   Platelets 150 - 450 x10E3/uL 281  216       There is no height or weight on file to calculate BMI.  Orders:  No orders of the defined types were placed in this encounter.  No orders of the defined types were placed in this encounter.    Procedures: No procedures performed  Clinical Data: No additional findings.  ROS:  All other systems negative, except as noted in the HPI. Review of Systems  Objective: Vital Signs: There were no vitals taken for this  visit.  Specialty Comments:  No specialty comments available.  PMFS History: Patient Active Problem List   Diagnosis Date Noted   S/P transmetatarsal amputation of foot, left ([place]) 04/18/2022   CKD (chronic kidney disease) stage 5, GFR less than 15 ml/min ([name]) 04/18/2022   Arthralgia of right lower leg    Acute blood loss anemia    Unilateral complete BKA, right, initial encounter ([place]) 04/03/2022   S/P BKA (below knee amputation), right ([place]) 04/03/2022   Cirrhosis ([place]) 03/31/2022   Fournier's gangrene in male    Scrotal infection 03/25/2022   Anemia 03/25/2022   Diarrhea 03/25/2022   Shock liver 03/25/2022   Acute respiratory failure with hypoxia ([place]) 03/25/2022   Gout 03/25/2022   Acute nonintractable headache 03/25/2022   Demand ischemia ([place]) 03/25/2022   Thrombocytopenia ([place]) 03/25/2022   Obesity, Class III, BMI 40-49.9 (morbid obesity) ([place]) 03/25/2022   Cardiac arrest ([place]) 03/25/2022   Hypotension    Respiratory insufficiency    Cellulitis    Malnutrition of moderate degree 03/12/2022   Altered mental status    Gangrene of right foot ([place])    Gangrene of left foot ([place])    Severe protein-calorie malnutrition ([place]) 03/10/2022   Encounter for central line placement    Pressure injury of skin 02/23/2022   Septic shock ([place]) 02/22/2022   AKI (acute kidney injury) ([place])    Generalized abdominal pain    Lactic acidosis    Atrial fibrillation with RVR ([name])    Hypokalemia    Hypomagnesemia    S/P TKR (total knee replacement), bilateral 11/04/2018   Bilateral primary osteoarthritis of knee    Rupture of medial head of gastrocnemius, left, sequela 01/14/2017   Kidney stones 06/22/2016   Essential hypertension 08/01/2010   DYSTHYMIC DISORDER 07/08/2010   PALPITATIONS 07/08/2010   Past Medical History:  Diagnosis Date   Concussion    Eosinophilic esophagitis    EGD/bx by Dr. Havery Moros   Gastric ulcer    History of kidney stones    Intermittent dysphagia     Migraine     Family History  Problem Relation Age of Onset   Arrhythmia Mother    COPD Mother    Sarcoidosis Brother    Cancer Brother    Stomach cancer Neg Hx    Pancreatic cancer Neg Hx    Esophageal cancer Neg Hx    Colon cancer Neg Hx    Rectal cancer Neg Hx     Past Surgical History:  Procedure Laterality Date   AMPUTATION Right 03/15/2022   Procedure: RIGHT BELOW KNEE AMPUTATION;  Surgeon: [name], MD;  Location: [place];  Service: Orthopedics;  Laterality: Right;   CYSTOSCOPY  DENTAL SURGERY     FINGER SURGERY     HERNIA REPAIR     at age 54   IR FLUORO GUIDE CV LINE RIGHT  03/09/2022   IR PATIENT EVAL TECH 0-60 MINS  03/09/2022   IR US GUIDE VASC ACCESS RIGHT  03/09/2022   KNEE ARTHROSCOPY Left    LITHOTRIPSY     NEPHROLITHOTOMY     SCROTAL EXPLORATION N/A 03/16/2022   Procedure: IRRIGATION AND DEBRIDEMENT SCROTUM;  Surgeon: Raynelle Bring, MD;  Location: [place];  Service: Urology;  Laterality: N/A;   SCROTAL EXPLORATION N/A 03/28/2022   Procedure: IRRIGATION AND DEBRIDEMENT SCROTUM;  Surgeon: Ardis Hughs, MD;  Location: [facility];  Service: Urology;  Laterality: N/A;   TONSILLECTOMY     TOTAL KNEE ARTHROPLASTY Bilateral 11/04/2018   Procedure: BILATERAL TOTAL KNEE ARTHROPLASTY;  Surgeon: [name], MD;  Location: [facility];  Service: Orthopedics;  Laterality: Bilateral;  spinal/epidural per anesthesiologist   TRANSMETATARSAL AMPUTATION Left 03/15/2022   Procedure: LEFT TRANSMETATARSAL AMPUTATION;  Surgeon: [name], MD;  Location: [facility];  Service: Orthopedics;  Laterality: Left;   Social History   Occupational History   Occupation: Airline pilot  Tobacco Use   Smoking status: Never   Smokeless tobacco: Never  Vaping Use   Vaping Use: Never used  Substance and Sexual Activity   Alcohol use: Not Currently    Comment: occassional   Drug use: No   Sexual activity: Not Currently

## 2022-05-06 ENCOUNTER — Ambulatory Visit: Payer: BC Managed Care – PPO

## 2022-05-06 DIAGNOSIS — R2689 Other abnormalities of gait and mobility: Secondary | ICD-10-CM

## 2022-05-07 DIAGNOSIS — Z992 Dependence on renal dialysis: Secondary | ICD-10-CM | POA: Diagnosis not present

## 2022-05-07 DIAGNOSIS — N178 Other acute kidney failure: Secondary | ICD-10-CM | POA: Diagnosis not present

## 2022-05-07 DIAGNOSIS — N179 Acute kidney failure, unspecified: Secondary | ICD-10-CM | POA: Diagnosis not present

## 2022-05-07 DIAGNOSIS — N2581 Secondary hyperparathyroidism of renal origin: Secondary | ICD-10-CM | POA: Diagnosis not present

## 2022-05-09 DIAGNOSIS — Z992 Dependence on renal dialysis: Secondary | ICD-10-CM | POA: Diagnosis not present

## 2022-05-09 DIAGNOSIS — N178 Other acute kidney failure: Secondary | ICD-10-CM | POA: Diagnosis not present

## 2022-05-09 DIAGNOSIS — N2581 Secondary hyperparathyroidism of renal origin: Secondary | ICD-10-CM | POA: Diagnosis not present

## 2022-05-12 DIAGNOSIS — N178 Other acute kidney failure: Secondary | ICD-10-CM | POA: Diagnosis not present

## 2022-05-12 DIAGNOSIS — R6883 Chills (without fever): Secondary | ICD-10-CM | POA: Diagnosis not present

## 2022-05-12 DIAGNOSIS — Z992 Dependence on renal dialysis: Secondary | ICD-10-CM | POA: Diagnosis not present

## 2022-05-12 DIAGNOSIS — N2581 Secondary hyperparathyroidism of renal origin: Secondary | ICD-10-CM | POA: Diagnosis not present

## 2022-05-12 DIAGNOSIS — Z23 Encounter for immunization: Secondary | ICD-10-CM | POA: Diagnosis not present

## 2022-05-13 ENCOUNTER — Telehealth: Payer: Self-pay | Admitting: *Deleted

## 2022-05-13 ENCOUNTER — Encounter: Payer: Self-pay | Admitting: Registered Nurse

## 2022-05-13 ENCOUNTER — Encounter: Payer: Self-pay | Admitting: Cardiovascular Disease

## 2022-05-13 ENCOUNTER — Encounter: Payer: BC Managed Care – PPO | Attending: Registered Nurse | Admitting: Registered Nurse

## 2022-05-13 ENCOUNTER — Other Ambulatory Visit: Payer: Self-pay | Admitting: *Deleted

## 2022-05-13 VITALS — BP 109/75 | HR 105 | Ht 70.0 in | Wt 196.2 lb

## 2022-05-13 DIAGNOSIS — I4891 Unspecified atrial fibrillation: Secondary | ICD-10-CM | POA: Diagnosis not present

## 2022-05-13 DIAGNOSIS — Z89511 Acquired absence of right leg below knee: Secondary | ICD-10-CM | POA: Diagnosis not present

## 2022-05-13 DIAGNOSIS — G546 Phantom limb syndrome with pain: Secondary | ICD-10-CM | POA: Insufficient documentation

## 2022-05-13 DIAGNOSIS — Z89432 Acquired absence of left foot: Secondary | ICD-10-CM | POA: Diagnosis not present

## 2022-05-13 MED ORDER — GABAPENTIN 100 MG PO CAPS: 100.0000 mg | ORAL_CAPSULE | Freq: Three times a day (TID) | ORAL | 1 refills | Status: DC

## 2022-05-13 MED ORDER — RIVAROXABAN 20 MG PO TABS: 20.0000 mg | ORAL_TABLET | Freq: Every day | ORAL | 6 refills | Status: DC

## 2022-05-13 NOTE — Progress Notes (Unsigned)
Subjective:    Patient ID: Jeffrey Ayala, male    DOB: 06-01-1961, 61 y.o.   MRN: [id number]  HPI  Pain Inventory Average Pain 0 Pain Right Now 0 My pain is  No pain  LOCATION OF PAIN  N/A  BOWEL Number of stools per week: 4   BLADDER Dialysis    Mobility ability to climb steps?  no do you drive?  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  yes  Neuro/Psych weakness depression  Prior Studies Any changes since last visit?  no  Physicians involved in your care Any changes since last visit?  no   Family History  Problem Relation Age of Onset  . Arrhythmia Mother   . COPD Mother   . Sarcoidosis Brother   . Cancer Brother   . Stomach cancer Neg Hx   . Pancreatic cancer Neg Hx   . Esophageal cancer Neg Hx   . Colon cancer Neg Hx   . Rectal cancer Neg Hx    Social History   Socioeconomic History  . Marital status: Married    Spouse name: Not on file  . Number of children: 2  . Years of education: Not on file  . Highest education level: Not on file  Occupational History  . Occupation: Airline pilot  Tobacco Use  . Smoking status: Never  . Smokeless tobacco: Never  Vaping Use  . Vaping Use: Never used  Substance and Sexual Activity  . Alcohol use: Not Currently    Comment: occassional  . Drug use: No  . Sexual activity: Not Currently  Other Topics Concern  . Not on file  Social History Narrative  . Not on file   Social Determinants of Health   Financial Resource Strain: Not on file  Food Insecurity: Not on file  Transportation Needs: Not on file  Physical Activity: Not on file  Stress: Not on file  Social Connections: Not on file   Past Surgical History:  Procedure Laterality Date  . AMPUTATION Right 03/15/2022   Procedure: RIGHT BELOW  KNEE AMPUTATION;  Surgeon: [name], MD;  Location: [place] [facility];  Service: Orthopedics;  Laterality: Right;  . CYSTOSCOPY    . DENTAL SURGERY    . FINGER SURGERY    . HERNIA REPAIR     at age 35  . IR FLUORO GUIDE CV LINE RIGHT  03/09/2022  . IR PATIENT EVAL TECH 0-60 MINS  03/09/2022  . IR US GUIDE VASC ACCESS RIGHT  03/09/2022  . KNEE ARTHROSCOPY Left   . LITHOTRIPSY    . NEPHROLITHOTOMY    . SCROTAL EXPLORATION N/A 03/16/2022   Procedure: IRRIGATION AND DEBRIDEMENT SCROTUM;  Surgeon: Raynelle Bring, MD;  Location: [facility];  Service: Urology;  Laterality: N/A;  . SCROTAL EXPLORATION N/A 03/28/2022   Procedure: IRRIGATION AND DEBRIDEMENT SCROTUM;  Surgeon: Ardis Hughs, MD;  Location: [facility];  Service: Urology;  Laterality: N/A;  . TONSILLECTOMY    . TOTAL KNEE ARTHROPLASTY Bilateral 11/04/2018   Procedure: BILATERAL TOTAL KNEE ARTHROPLASTY;  Surgeon: [name], MD;  Location: [place];  Service: Orthopedics;  Laterality: Bilateral;  spinal/epidural per anesthesiologist  . TRANSMETATARSAL AMPUTATION Left 03/15/2022   Procedure: LEFT TRANSMETATARSAL AMPUTATION;  Surgeon: [name], MD;  Location: [facility];  Service: Orthopedics;  Laterality: Left;   Past Medical History:  Diagnosis Date  . Concussion   . Eosinophilic esophagitis    EGD/bx by Dr. Havery Moros  . Gastric ulcer   . History of kidney stones   . Intermittent dysphagia   . Migraine    BP 109/75   Pulse (!) 105   Ht '5\' 10"'$  (1.778 m)   Wt 196 lb 3.2 oz (89 kg)   SpO2 99%   BMI 28.15 kg/m   Opioid Risk Score:   Fall Risk Score:  `1  Depression screen [facility] 2/9     05/13/2022    2:30 PM 04/27/2022   11:09 AM  Depression screen PHQ 2/9  Decreased Interest 1 0  Down, Depressed, Hopeless 2 3  PHQ - 2 Score 3 3  Altered sleeping 0   Tired, decreased energy 1   Change in appetite 0   Feeling bad or failure about yourself  3   Trouble concentrating 0   Moving slowly or fidgety/restless 0   Suicidal thoughts 1    PHQ-9 Score 8   Difficult doing work/chores Very difficult      Review of Systems  Constitutional: Negative.   HENT: Negative.    Eyes: Negative.   Respiratory:  Positive for cough.   Cardiovascular: Negative.   Gastrointestinal:  Positive for nausea.  Endocrine: Negative.   Genitourinary: Negative.   Musculoskeletal:  Positive for gait problem.  Skin: Negative.   Allergic/Immunologic: Negative.   Neurological:  Positive for weakness.  Hematological: Negative.   Psychiatric/Behavioral:  Positive for dysphoric mood.       Objective:   Physical Exam        Assessment & Plan:

## 2022-05-13 NOTE — Telephone Encounter (Signed)
Called and spoke with pharmacy- advised them to cancel order for Xarelto. Pharmacy is aware and deleted prescription and did not fill.

## 2022-05-13 NOTE — Patient Instructions (Signed)
Start Gabapentin tonight: One capsule at bedtime only for the next 5 days  If Phantom Pain Persists   Take Gabapentin 100 mg in the morning and one at bedtime for the next 5 days  If Phantom pain persists   Take Gabapentin 100 mg three times a day : In the Morning, Late Afternoon and At Bedtime.    Send a My Chart Message with Update:   He can't take more than 300 mg of Gabapentin due to being on Hemodialysis

## 2022-05-13 NOTE — Telephone Encounter (Signed)
[facility] contacted, spoke with pharmacist Wells Guiles and canceled xarelto rx.

## 2022-05-13 NOTE — Telephone Encounter (Signed)
Patient informed and verbalized understanding of plan. 

## 2022-05-14 ENCOUNTER — Ambulatory Visit: Payer: BC Managed Care – PPO | Admitting: Family Medicine

## 2022-05-14 DIAGNOSIS — N178 Other acute kidney failure: Secondary | ICD-10-CM | POA: Diagnosis not present

## 2022-05-14 DIAGNOSIS — N179 Acute kidney failure, unspecified: Secondary | ICD-10-CM | POA: Diagnosis not present

## 2022-05-14 DIAGNOSIS — R6883 Chills (without fever): Secondary | ICD-10-CM | POA: Diagnosis not present

## 2022-05-14 DIAGNOSIS — Z23 Encounter for immunization: Secondary | ICD-10-CM | POA: Diagnosis not present

## 2022-05-14 DIAGNOSIS — N2581 Secondary hyperparathyroidism of renal origin: Secondary | ICD-10-CM | POA: Diagnosis not present

## 2022-05-14 DIAGNOSIS — Z992 Dependence on renal dialysis: Secondary | ICD-10-CM | POA: Diagnosis not present

## 2022-05-14 NOTE — Telephone Encounter (Signed)
Called pt to set up appt with [facility], no answer. Left voicemail with number to call as soon as possible. [phone].

## 2022-05-15 ENCOUNTER — Telehealth: Payer: Self-pay

## 2022-05-15 DIAGNOSIS — I4891 Unspecified atrial fibrillation: Secondary | ICD-10-CM

## 2022-05-15 MED ORDER — WARFARIN SODIUM 5 MG PO TABS: 5.0000 mg | ORAL_TABLET | Freq: Every day | ORAL | 0 refills | Status: DC

## 2022-05-15 NOTE — Telephone Encounter (Signed)
Scheduled pt for New Coumadin appt on 05/20/22 at 10:30am. Refer to telephone note.

## 2022-05-16 DIAGNOSIS — Z992 Dependence on renal dialysis: Secondary | ICD-10-CM | POA: Diagnosis not present

## 2022-05-16 DIAGNOSIS — N2581 Secondary hyperparathyroidism of renal origin: Secondary | ICD-10-CM | POA: Diagnosis not present

## 2022-05-16 DIAGNOSIS — Z23 Encounter for immunization: Secondary | ICD-10-CM | POA: Diagnosis not present

## 2022-05-16 DIAGNOSIS — R6883 Chills (without fever): Secondary | ICD-10-CM | POA: Diagnosis not present

## 2022-05-16 DIAGNOSIS — N178 Other acute kidney failure: Secondary | ICD-10-CM | POA: Diagnosis not present

## 2022-05-18 MED ORDER — TRAZODONE HCL 50 MG PO TABS: 25.0000 mg | ORAL_TABLET | Freq: Every evening | ORAL | 2 refills | Status: DC | PRN

## 2022-05-19 DIAGNOSIS — N2581 Secondary hyperparathyroidism of renal origin: Secondary | ICD-10-CM | POA: Diagnosis not present

## 2022-05-19 DIAGNOSIS — N178 Other acute kidney failure: Secondary | ICD-10-CM | POA: Diagnosis not present

## 2022-05-19 DIAGNOSIS — R6883 Chills (without fever): Secondary | ICD-10-CM | POA: Diagnosis not present

## 2022-05-19 DIAGNOSIS — Z992 Dependence on renal dialysis: Secondary | ICD-10-CM | POA: Diagnosis not present

## 2022-05-19 DIAGNOSIS — N179 Acute kidney failure, unspecified: Secondary | ICD-10-CM | POA: Diagnosis not present

## 2022-05-20 ENCOUNTER — Ambulatory Visit (INDEPENDENT_AMBULATORY_CARE_PROVIDER_SITE_OTHER): Payer: BC Managed Care – PPO

## 2022-05-20 DIAGNOSIS — Z7901 Long term (current) use of anticoagulants: Secondary | ICD-10-CM | POA: Diagnosis not present

## 2022-05-20 DIAGNOSIS — I4891 Unspecified atrial fibrillation: Secondary | ICD-10-CM | POA: Diagnosis not present

## 2022-05-20 DIAGNOSIS — Z89432 Acquired absence of left foot: Secondary | ICD-10-CM | POA: Diagnosis not present

## 2022-05-20 LAB — POCT INR: INR: 1.7 — AB (ref 2.0–3.0)

## 2022-05-20 NOTE — Patient Instructions (Addendum)
Description   START taking 1 tablet daily EXCEPT 0.5 tablet on Mondays, Wednesday, and Fridays.  Recheck INR in 1 week  [facility] [phone]     A full discussion of the nature of anticoagulants has been carried out.  A benefit risk analysis has been presented to the patient, so that they understand the justification for choosing anticoagulation at this time. The need for frequent and regular monitoring, precise dosage adjustment and compliance is stressed.  Side effects of potential bleeding are discussed.  The patient should avoid any OTC items containing aspirin or ibuprofen, and should avoid great swings in general diet.  Avoid alcohol consumption.

## 2022-05-21 DIAGNOSIS — R6883 Chills (without fever): Secondary | ICD-10-CM | POA: Diagnosis not present

## 2022-05-21 DIAGNOSIS — Z992 Dependence on renal dialysis: Secondary | ICD-10-CM | POA: Diagnosis not present

## 2022-05-21 DIAGNOSIS — N178 Other acute kidney failure: Secondary | ICD-10-CM | POA: Diagnosis not present

## 2022-05-21 DIAGNOSIS — N2581 Secondary hyperparathyroidism of renal origin: Secondary | ICD-10-CM | POA: Diagnosis not present

## 2022-05-22 ENCOUNTER — Telehealth: Payer: Self-pay | Admitting: Cardiovascular Disease

## 2022-05-22 NOTE — Telephone Encounter (Signed)
[facility] would like for Dr. Kathalene Frames office to initiate a referral to them. Please call back [phone]

## 2022-05-22 NOTE — Telephone Encounter (Signed)
Please call Antonietta Barcelona at [phone]

## 2022-05-23 DIAGNOSIS — N178 Other acute kidney failure: Secondary | ICD-10-CM | POA: Diagnosis not present

## 2022-05-23 DIAGNOSIS — Z992 Dependence on renal dialysis: Secondary | ICD-10-CM | POA: Diagnosis not present

## 2022-05-23 DIAGNOSIS — N2581 Secondary hyperparathyroidism of renal origin: Secondary | ICD-10-CM | POA: Diagnosis not present

## 2022-05-27 ENCOUNTER — Other Ambulatory Visit (INDEPENDENT_AMBULATORY_CARE_PROVIDER_SITE_OTHER): Payer: BC Managed Care – PPO

## 2022-05-27 ENCOUNTER — Ambulatory Visit (INDEPENDENT_AMBULATORY_CARE_PROVIDER_SITE_OTHER): Payer: BC Managed Care – PPO | Admitting: Gastroenterology

## 2022-05-27 ENCOUNTER — Ambulatory Visit (INDEPENDENT_AMBULATORY_CARE_PROVIDER_SITE_OTHER): Payer: BC Managed Care – PPO | Admitting: Family Medicine

## 2022-05-27 ENCOUNTER — Encounter: Payer: Self-pay | Admitting: Family Medicine

## 2022-05-27 ENCOUNTER — Encounter: Payer: Self-pay | Admitting: Gastroenterology

## 2022-05-27 VITALS — BP 110/70 | HR 101 | Ht 70.0 in

## 2022-05-27 VITALS — BP 108/62 | HR 110 | Temp 97.8°F | Ht 70.0 in

## 2022-05-27 DIAGNOSIS — K7460 Unspecified cirrhosis of liver: Secondary | ICD-10-CM | POA: Diagnosis not present

## 2022-05-27 DIAGNOSIS — K219 Gastro-esophageal reflux disease without esophagitis: Secondary | ICD-10-CM | POA: Diagnosis not present

## 2022-05-27 DIAGNOSIS — Z89511 Acquired absence of right leg below knee: Secondary | ICD-10-CM

## 2022-05-27 DIAGNOSIS — Z7901 Long term (current) use of anticoagulants: Secondary | ICD-10-CM

## 2022-05-27 DIAGNOSIS — M109 Gout, unspecified: Secondary | ICD-10-CM

## 2022-05-27 DIAGNOSIS — Z992 Dependence on renal dialysis: Secondary | ICD-10-CM

## 2022-05-27 DIAGNOSIS — Z89432 Acquired absence of left foot: Secondary | ICD-10-CM

## 2022-05-27 DIAGNOSIS — R142 Eructation: Secondary | ICD-10-CM

## 2022-05-27 DIAGNOSIS — R110 Nausea: Secondary | ICD-10-CM | POA: Diagnosis not present

## 2022-05-27 DIAGNOSIS — D649 Anemia, unspecified: Secondary | ICD-10-CM

## 2022-05-27 DIAGNOSIS — F329 Major depressive disorder, single episode, unspecified: Secondary | ICD-10-CM

## 2022-05-27 DIAGNOSIS — I4891 Unspecified atrial fibrillation: Secondary | ICD-10-CM | POA: Diagnosis not present

## 2022-05-27 LAB — URIC ACID: Uric Acid, Serum: 8.5 mg/dL — ABNORMAL HIGH (ref 4.0–7.8)

## 2022-05-27 LAB — HEPATIC FUNCTION PANEL
ALT: 6 U/L (ref 0–53)
AST: 11 U/L (ref 0–37)
Albumin: 4.2 g/dL (ref 3.5–5.2)
Alkaline Phosphatase: 85 U/L (ref 39–117)
Bilirubin, Direct: 0.1 mg/dL (ref 0.0–0.3)
Total Bilirubin: 0.6 mg/dL (ref 0.2–1.2)
Total Protein: 7.4 g/dL (ref 6.0–8.3)

## 2022-05-27 LAB — FOLATE: Folate: >24.2 ng/mL (ref >5.9)

## 2022-05-27 LAB — VITAMIN B12: Vitamin B-12: 246 pg/mL (ref 211–911)

## 2022-05-27 MED ORDER — ALLOPURINOL 100 MG PO TABS: 100.0000 mg | ORAL_TABLET | Freq: Every day | ORAL | 3 refills | Status: DC

## 2022-05-27 MED ORDER — PANTOPRAZOLE SODIUM 40 MG PO TBEC: 40.0000 mg | DELAYED_RELEASE_TABLET | Freq: Two times a day (BID) | ORAL | 3 refills | Status: DC

## 2022-05-27 NOTE — Patient Instructions (Signed)
If you are age 61 or older, your body mass index should be between 23-30. Your Body mass index is 28.15 kg/m. If this is out of the aforementioned range listed, please consider follow up with your Primary Care Provider.  If you are age 32 or younger, your body mass index should be between 19-25. Your Body mass index is 28.15 kg/m. If this is out of the aformentioned range listed, please consider follow up with your Primary Care Provider.   ________________________________________________________  The [facility] providers would like to encourage you to use [facility] to communicate with providers for non-urgent requests or questions.  Due to long hold times on the telephone, sending your provider a message by [facility] may be a faster and more efficient way to get a response.  Please allow 48 business hours for a response.  Please remember that this is for non-urgent requests.  _______________________________________________________  Your provider has requested that you go to the basement level for lab work before leaving today. Press "B" on the elevator. The lab is located at the first door on the left as you exit the elevator.  We have sent the following medications to your pharmacy for you to pick up at your convenience: Pantoprazole  Please follow up with Janett Billow or Dr. Havery Moros in 3 months

## 2022-05-27 NOTE — Progress Notes (Signed)
New Patient Office Visit  Subjective    Patient ID: Jeffrey Ayala, male    DOB: [birth year]  Age: 61 y.o. MRN: [id number]  CC:  Chief Complaint  Patient presents with   Establish Care    No concerns, would like medication refills from when he was in hospital    HPI Jeffrey Ayala presents to establish care.  No recent PCP. He has several specialists caring for him including GI, orthopedics, nephrology, urology and cardiology since recent hospitalization.  His wife is with him today. He is sitting in a wheelchair today.   Hospitalized from 02/22/2022-04/15/2022 and 5/27-5/30/2023 for sepsis related to scrotal abscess and new diagnosis of cirrhosis. He also was diagnosed with atrial fibrillation while hospitalized. He was on Eliquis and switched to warfarin. Cardiologist is managing anticoagulant.   2 amputations during hospitalization including right BKA and left foot.   Gout on right elbow in May. Several gout attacks. He Is requesting refill of allopurinol today.   Hemodialysis- T, TH, Sat Porta-cath right upper chest wall.      Reports persistent nausea and will see GI later this morning.   He has been quite depressed and is considering seeing a therapist.   Denies fever, chills, dizziness, headaches, chest pain, shortness of breath, abdominal pain, vomiting or diarrhea.       Outpatient Encounter Medications as of 05/27/2022  Medication Sig   acetaminophen (TYLENOL) 650 MG CR tablet Take 650 mg by mouth every 8 (eight) hours as needed for pain or fever.   lanthanum (FOSRENOL) 1000 MG chewable tablet Chew 1 tablet (1,000 mg total) by mouth 3 (three) times daily with meals.   metoprolol tartrate (LOPRESSOR) 25 MG tablet Take 0.5 tablets (12.5 mg total) by mouth 2 (two) times daily.   multivitamin (RENA-VIT) TABS tablet Take 1 tablet by mouth at bedtime.   simethicone (MYLICON) 80 MG chewable tablet Chew 1 tablet (80 mg total) by mouth 4 (four) times daily as needed for  flatulence.   traZODone (DESYREL) 50 MG tablet Take 0.5-1 tablets (25-50 mg total) by mouth at bedtime as needed for sleep.   warfarin (COUMADIN) 5 MG tablet Take 1 tablet (5 mg total) by mouth daily.   [DISCONTINUED] allopurinol (ZYLOPRIM) 100 MG tablet Take 1 tablet (100 mg total) by mouth daily.   [DISCONTINUED] pantoprazole (PROTONIX) 40 MG tablet Take 1 tablet (40 mg total) by mouth daily.   allopurinol (ZYLOPRIM) 100 MG tablet Take 1 tablet (100 mg total) by mouth daily.   [DISCONTINUED] ascorbic acid (VITAMIN C) 1000 MG tablet Take 1 tablet (1,000 mg total) by mouth daily.   [DISCONTINUED] folic acid (FOLVITE) 1 MG tablet Take 1 tablet (1 mg total) by mouth daily.   [DISCONTINUED] gabapentin (NEURONTIN) 100 MG capsule Take 1 capsule (100 mg total) by mouth 3 (three) times daily. (Patient not taking: Reported on 05/27/2022)   [DISCONTINUED] hydrOXYzine (ATARAX) 10 MG/5ML syrup Take 12.5 mLs (25 mg total) by mouth 2 (two) times daily as needed for itching.   [DISCONTINUED] leptospermum manuka honey (MEDIHONEY) PSTE paste Apply 1 application. topically daily. To elbow wound, cover with damp to dry dressing. Change daily. (Patient not taking: Reported on 04/27/2022)   [DISCONTINUED] melatonin 5 MG TABS Take 1 tablet (5 mg total) by mouth at bedtime.   [DISCONTINUED] QUEtiapine (SEROQUEL) 25 MG tablet Take 1 tablet (25 mg total) by mouth at bedtime. (Patient taking differently: Take 12.5 mg by mouth at bedtime.)   No facility-administered encounter medications  on file as of 05/27/2022.    Past Medical History:  Diagnosis Date   Concussion    Eosinophilic esophagitis    EGD/bx by Dr. Havery Moros   Gastric ulcer    History of kidney stones    Intermittent dysphagia    Migraine     Past Surgical History:  Procedure Laterality Date   AMPUTATION Right 03/15/2022   Procedure: RIGHT BELOW KNEE AMPUTATION;  Surgeon: [name], MD;  Location: [place];  Service: Orthopedics;  Laterality: Right;    CYSTOSCOPY     DENTAL SURGERY     FINGER SURGERY     HERNIA REPAIR     at age 43   IR FLUORO GUIDE CV LINE RIGHT  03/09/2022   IR PATIENT EVAL TECH 0-60 MINS  03/09/2022   IR US GUIDE VASC ACCESS RIGHT  03/09/2022   KNEE ARTHROSCOPY Left    LITHOTRIPSY     NEPHROLITHOTOMY     SCROTAL EXPLORATION N/A 03/16/2022   Procedure: IRRIGATION AND DEBRIDEMENT SCROTUM;  Surgeon: Raynelle Bring, MD;  Location: [place];  Service: Urology;  Laterality: N/A;   SCROTAL EXPLORATION N/A 03/28/2022   Procedure: IRRIGATION AND DEBRIDEMENT SCROTUM;  Surgeon: Ardis Hughs, MD;  Location: [place];  Service: Urology;  Laterality: N/A;   TONSILLECTOMY     TOTAL KNEE ARTHROPLASTY Bilateral 11/04/2018   Procedure: BILATERAL TOTAL KNEE ARTHROPLASTY;  Surgeon: [name], MD;  Location: [place];  Service: Orthopedics;  Laterality: Bilateral;  spinal/epidural per anesthesiologist   TRANSMETATARSAL AMPUTATION Left 03/15/2022   Procedure: LEFT TRANSMETATARSAL AMPUTATION;  Surgeon: [name], MD;  Location: [place];  Service: Orthopedics;  Laterality: Left;    Family History  Problem Relation Age of Onset   Arrhythmia Mother    COPD Mother    Sarcoidosis Brother    Cancer Brother    Stomach cancer Neg Hx    Pancreatic cancer Neg Hx    Esophageal cancer Neg Hx    Colon cancer Neg Hx    Rectal cancer Neg Hx     Social History   Socioeconomic History   Marital status: Married    Spouse name: Not on file   Number of children: 2   Years of education: Not on file   Highest education level: Not on file  Occupational History   Occupation: Press photographer - Clinical cytogeneticist  Tobacco Use   Smoking status: Never   Smokeless tobacco: Never  Vaping Use   Vaping Use: Never used  Substance and Sexual Activity   Alcohol use: Not Currently    Comment: occassional   Drug use: No   Sexual activity: Not Currently  Other Topics Concern   Not on file  Social History Narrative   Not on file   Social Determinants of  Health   Financial Resource Strain: Not on file  Food Insecurity: Not on file  Transportation Needs: Not on file  Physical Activity: Not on file  Stress: Not on file  Social Connections: Not on file  Intimate Partner Violence: Not on file    ROS Pertinent positives and negatives in the history of present illness.      Objective    BP 108/62 (BP Location: Left Arm, Patient Position: Sitting, Cuff Size: Large)   Pulse (!) 110   Temp 97.8 F (36.6 C) (Temporal)   Ht '5\' 10"'$  (3.149 m)   SpO2 99%   BMI 28.15 kg/m   Physical Exam Constitutional:      General:  He is not in acute distress. Cardiovascular:     Rate and Rhythm: Normal rate.  Pulmonary:     Effort: Pulmonary effort is normal.  Neurological:     Mental Status: He is alert.  Psychiatric:        Mood and Affect: Mood is depressed.        Speech: Speech normal.         Assessment & Plan:   Problem List Items Addressed This Visit       Cardiovascular and Mediastinum   Atrial fibrillation ([place])    On warfarin. Followed by cardiologist.         Digestive   Cirrhosis [facility])     Other   Dependence on renal dialysis ([place])    Hemodialysis schedule T, Th, Sat.  Currently has a portacath      Gout - Primary    Allopurinol refilled. Discuss with nephrologist in regards to dialysis days      Relevant Medications   allopurinol (ZYLOPRIM) 100 MG tablet   Long term (current) use of anticoagulants    Followed by cardiology coumadin clinic.       Nausea   Reactive depression    Discussed therapy including group therapy. He has been checking into some online group therapy. Referral to psychologist.       S/P BKA (below knee amputation), right ([place])   S/P transmetatarsal amputation of foot, left ([place])    Return in about 3 months (around 08/27/2022).   Harland Dingwall, NP-C

## 2022-05-27 NOTE — Progress Notes (Signed)
Agree with assessment and plan as outlined.  

## 2022-05-27 NOTE — Progress Notes (Signed)
05/27/2022 GARWOOD WENTZELL [id number] Jul 14, 1961   HISTORY OF PRESENT ILLNESS: This is a 61 year old male was a patient of Dr. Doyne Keel who follows here for his issues regarding acid reflux, history of EOE, history of gastric ulcer.  Is on pantoprazole 40 mg daily.  Last seen here in March 2021.  He is here today for follow-up after a 52-day hospital stay, discharged on 04/20/2022.  Here for abnormal imaging studies.  Had an ultrasound and a few CT scans performed during the course of the hospitalization.  Looks like a new diagnosis of cirrhosis.  Hospitalization ultimately was for sepsis related to a scrotal infection.  Developed new atrial fibrillation requiring anticoagulation, acute kidney injury for which he is now on dialysis, and underwent right BKA and left transmetatarsal amputation for ongoing vasopressor requirements.  In regards to the cirrhosis, this is a new diagnosis.  He denies any history of heavy alcohol use.  No family history of liver disease.  They tell me that he was checked for viral hepatitis which were negative and they are vaccinating him for hepatitis A and B.  Was noted to have splenomegaly, but his wife says that they have been told this in the past, looks like at least dating back to 2015.  Has been anemic since hospital stay, hemoglobin down in the 6 g range at one point while in the hospital which required transfusions.  Hemoccult negative on a few occasions.  Denies any dark or bloody stools.  No abdominal pain.  Acid reflux well controlled on pantoprazole 40 mg daily and needs that refilled.  EGD 01/02/20 -  - Mucosal changes including mild ringed esophagus and longitudinal furrows were found in the middle third of the esophagus and in the lower third of the esophagus. Biopsies were obtained from the proximal and distal esophagus with cold forceps for histology for possible underlying eosinophilic esophagitis. - The exam of the esophagus was otherwise normal.  No obvious stenosis / stricture noted. - One cratered gastric ulcer was found at the pylorus. The lesion was 3 mm in largest dimension, associated with significant edema / inflammatory changes. Biopsies were taken with a cold forceps for histology from the periphery of it. - Patchy moderate inflammation characterized by erythema, friability and granularity was found in the gastric antrum. - The exam of the stomach was otherwise normal. - Biopsies were taken with a cold forceps in the gastric body, at the incisura and in the gastric antrum for Helicobacter pylori testing. - Diffuse moderate inflammation characterized by erythema was found in the distal duodenal bulb, with edema traversing this area into the sweep. Biopsies were taken with a cold forceps for histology. - The exam of the duodenum was otherwise normal.   Diagnosis 1. Surgical [P], duodenal - DUODENAL MUCOSA WITH CHANGES CONSISTENT WITH PEPTIC INJURY. - NO FEATURES OF SPRUE OR GRANULOMAS. 2. Surgical [P], gastric antrum and gastric body - ANTRAL AND OXYNTIC MUCOSA WITH MILDLY ACTIVE CHRONIC INFLAMMATION AND REACTIVE CHANGES. - WARTHIN-STARRY NEGATIVE FOR HELICOBACTER PYLORI. - NO INTESTINAL METAPLASIA, DYSPLASIA OR CARCINOMA. 3. Surgical [P], esophagus - SQUAMOUS MUCOSA WITH REFLUX CHANGES AND INCREASED EOSINOPHILS. - FOCALLY GREATER THAN 20 PER HIGH POWER FIELD. - NO DYSPLASIA OR CARCINOMA. 4. Surgical [P], gastric pylorus, peripheral ulcer - PYLORIC MUCOSA WITH MILDLY ACTIVE INFLAMMATION, HYPEREMIA AND REACTIVE CHANGES. - NO INTESTINAL METAPLASIA, DYSPLASIA OR CARCINOMA.  Past Medical History:  Diagnosis Date   Concussion    Eosinophilic esophagitis    EGD/bx by Dr.  Armbruster   Gastric ulcer    History of kidney stones    Intermittent dysphagia    Migraine    Past Surgical History:  Procedure Laterality Date   AMPUTATION Right 03/15/2022   Procedure: RIGHT BELOW KNEE AMPUTATION;  Surgeon: [name], MD;   Location: [place];  Service: Orthopedics;  Laterality: Right;   CYSTOSCOPY     DENTAL SURGERY     FINGER SURGERY     HERNIA REPAIR     at age 32   IR FLUORO GUIDE CV LINE RIGHT  03/09/2022   IR PATIENT EVAL TECH 0-60 MINS  03/09/2022   IR US GUIDE VASC ACCESS RIGHT  03/09/2022   KNEE ARTHROSCOPY Left    LITHOTRIPSY     NEPHROLITHOTOMY     SCROTAL EXPLORATION N/A 03/16/2022   Procedure: IRRIGATION AND DEBRIDEMENT SCROTUM;  Surgeon: Raynelle Bring, MD;  Location: [place];  Service: Urology;  Laterality: N/A;   SCROTAL EXPLORATION N/A 03/28/2022   Procedure: IRRIGATION AND DEBRIDEMENT SCROTUM;  Surgeon: Ardis Hughs, MD;  Location: [place];  Service: Urology;  Laterality: N/A;   TONSILLECTOMY     TOTAL KNEE ARTHROPLASTY Bilateral 11/04/2018   Procedure: BILATERAL TOTAL KNEE ARTHROPLASTY;  Surgeon: [name], MD;  Location: [place];  Service: Orthopedics;  Laterality: Bilateral;  spinal/epidural per anesthesiologist   TRANSMETATARSAL AMPUTATION Left 03/15/2022   Procedure: LEFT TRANSMETATARSAL AMPUTATION;  Surgeon: [name], MD;  Location: [place];  Service: Orthopedics;  Laterality: Left;    reports that he has never smoked. He has never used smokeless tobacco. He reports that he does not currently use alcohol. He reports that he does not use drugs. family history includes Arrhythmia in his mother; COPD in his mother; Cancer in his brother; Sarcoidosis in his brother. Allergies  Allergen Reactions   Codeine Hives    Insomnia, jittery   Flomax [Tamsulosin Hcl] Nausea Only    Nausea and fatigue      Outpatient Encounter Medications as of 05/27/2022  Medication Sig   acetaminophen (TYLENOL) 650 MG CR tablet Take 650 mg by mouth every 8 (eight) hours as needed for pain or fever.   allopurinol (ZYLOPRIM) 100 MG tablet Take 1 tablet (100 mg total) by mouth daily.   lanthanum (FOSRENOL) 1000 MG chewable tablet Chew 1 tablet (1,000 mg total) by mouth 3 (three) times daily with meals.    metoprolol tartrate (LOPRESSOR) 25 MG tablet Take 0.5 tablets (12.5 mg total) by mouth 2 (two) times daily.   multivitamin (RENA-VIT) TABS tablet Take 1 tablet by mouth at bedtime.   pantoprazole (PROTONIX) 40 MG tablet Take 1 tablet (40 mg total) by mouth daily.   simethicone (MYLICON) 80 MG chewable tablet Chew 1 tablet (80 mg total) by mouth 4 (four) times daily as needed for flatulence.   traZODone (DESYREL) 50 MG tablet Take 0.5-1 tablets (25-50 mg total) by mouth at bedtime as needed for sleep.   warfarin (COUMADIN) 5 MG tablet Take 1 tablet (5 mg total) by mouth daily.   No facility-administered encounter medications on file as of 05/27/2022.    REVIEW OF SYSTEMS  : All other systems reviewed and negative except where noted in the History of Present Illness.   PHYSICAL EXAM: BP 110/70   Pulse (!) 101   Ht '5\' 10"'$  (1.778 m)   BMI 28.15 kg/m  General: Well developed white male in no acute distress; in wheelchair Head: Normocephalic and atraumatic Eyes:  Sclerae anicteric, conjunctiva pink.  Ears: Normal auditory acuity Lungs: Clear throughout to auscultation; no W/R/R. Heart: Regular rate and rhythm; no M/R/G. Abdomen: Soft, non-distended.  BS present.  Non-tender. Musculoskeletal: Symmetrical with no gross deformities  Skin: No lesions on visible extremities Extremities: Right BKA and left transmetatarsal amputation Neurological: Alert oriented x 4, grossly non-focal Psychological:  Alert and cooperative. Normal mood and affect  ASSESSMENT AND PLAN: *New diagnosis of cirrhosis: Suspect due to fatty liver.  No other imaging for comparison.  No varices on EGD in 2021.  No significant alcohol use.  They said that he was checked for viral hepatitis, which were negative and they are vaccinating him for hepatitis A and B.  We will check other liver serologies to rule out other causes of chronic liver disease.  We will check an AFP.  He has splenomegaly, but his wife says that that has  been mentioned in the past, looks like maybe even back to 2015.  Unsure if that is related to his cirrhosis or from something different.  He is up-to-date with imaging for [facility] surveillance and seems to be well compensated at this point.  Had some ascites and anasarca noted on CT scan, but once again this was during his hospitalization so could have been for multiple other reasons. *Anemia: MCV is normal.  Required some blood transfusions during hospital for hemoglobin in the 6 gram range.  He was Hemoccult negative a few different times recently.  Suspect that the anemia was related to his sepsis, renal injury, etc. rather than GI bleeding issue.  Hemoglobin has been holding stable around 8 grams.  No overt GI bleeding.  We will check iron levels, vitamin B12 and folate levels. *GERD/history of EOE/history of gastric ulcer:  Well controlled on pantoprazole 40 mg daily.  Will continue.  New prescription sent to pharmacy. *Sepsis related to a scrotal infection which resulted in a 50-day hospital stay with a new diagnosis of atrial fibrillation, renal failure now on dialysis, and lower extremity amputations from ongoing vasopressor use. *Acute kidney injury/ESRD, now with a tunnel cath and requiring hemodialysis *New diagnosis of atrial fibrillation: Started on Eliquis, but now on Coumadin with plans to hopefully restart Eliquis instead in the near future. *Right BKA and left transmetatarsal amputations due to pressor requirements during his sepsis.  -He will need another EGD for screening of esophageal varices and consideration for colonoscopy as well.  We are going to hold off on this for now as he just recently started dialysis, they are not sure if this is going to be a permanent thing or if his kidneys will recover.  Also new atrial fibrillation requiring anticoagulation.  Went from Eliquis to Coumadin and they are hoping to go back on Eliquis.  We will see him back in 3 months for reassessment at which  time I think about a better idea regarding his anticoagulation and holding that as well as whether his dialysis is going to be a long time. -Today we will check labs to rule out other causes of chronic liver disease as well as an AFP level and vitamin B12/folate levels.   CC:  Jerline Pain, MD

## 2022-05-27 NOTE — Patient Instructions (Addendum)
It was a pleasure meeting you today.  Thank you for trusting [place] with your health care.  Please ask your nephrologist about the allopurinol and warfarin combination as well as the dosing of allopurinol with hemodialysis.

## 2022-05-27 NOTE — Telephone Encounter (Signed)
Melissa from [facility] made aware.   Geralynn Rile, MD  You 27 minutes ago (3:43 PM)    Would have him discuss this with his PCP. Probably best they address this.   [place] T. Audie Box, MD, [place]  [address], [place]  [place], [place] [address]  [phone]  3:43 PM

## 2022-05-27 NOTE — Telephone Encounter (Signed)
-  Melissa from [facility] called requesting if Dr. Marisue Ivan can initiate a referral to mental health.  -She stated pt has anxiety and active panic attacks while on dialysis -She report pt seems severely depressed and not adjusting well to changes from previous life. -She reports pt becomes very tearful when discussing dialysis and will say things like, "I hate it here!" -She also report pt keep saying he's going to stop dialysis.    Will forward to MD

## 2022-05-28 ENCOUNTER — Telehealth: Payer: Self-pay | Admitting: Family Medicine

## 2022-05-28 DIAGNOSIS — F32A Depression, unspecified: Secondary | ICD-10-CM

## 2022-05-28 DIAGNOSIS — N2581 Secondary hyperparathyroidism of renal origin: Secondary | ICD-10-CM | POA: Diagnosis not present

## 2022-05-28 DIAGNOSIS — N178 Other acute kidney failure: Secondary | ICD-10-CM | POA: Diagnosis not present

## 2022-05-28 DIAGNOSIS — Z992 Dependence on renal dialysis: Secondary | ICD-10-CM | POA: Diagnosis not present

## 2022-05-28 NOTE — Telephone Encounter (Signed)
[facility] is calling you about this patient - they feel that he needs a mental health referral due to severe depression.

## 2022-05-28 NOTE — Telephone Encounter (Signed)
PCP offered referral at visit and patient declined. Referral to Psychiatry has been placed per [facility] request.

## 2022-05-29 ENCOUNTER — Ambulatory Visit (INDEPENDENT_AMBULATORY_CARE_PROVIDER_SITE_OTHER): Payer: BC Managed Care – PPO | Admitting: *Deleted

## 2022-05-29 DIAGNOSIS — I4891 Unspecified atrial fibrillation: Secondary | ICD-10-CM | POA: Diagnosis not present

## 2022-05-29 DIAGNOSIS — Z5181 Encounter for therapeutic drug level monitoring: Secondary | ICD-10-CM | POA: Diagnosis not present

## 2022-05-29 DIAGNOSIS — Z7901 Long term (current) use of anticoagulants: Secondary | ICD-10-CM

## 2022-05-29 LAB — POCT INR: INR: 1.1 — AB (ref 2.0–3.0)

## 2022-05-29 NOTE — Patient Instructions (Signed)
Description   Take 1 tablet of warfarin today Then START taking warfarin 1 tablet daily EXCEPT for 1/2 a tablet on Monday, Wednesday and Friday. Recheck INR in 1 week. Coumadin Clinic [phone].

## 2022-05-30 DIAGNOSIS — N178 Other acute kidney failure: Secondary | ICD-10-CM | POA: Diagnosis not present

## 2022-05-30 DIAGNOSIS — Z992 Dependence on renal dialysis: Secondary | ICD-10-CM | POA: Diagnosis not present

## 2022-05-30 DIAGNOSIS — N2581 Secondary hyperparathyroidism of renal origin: Secondary | ICD-10-CM | POA: Diagnosis not present

## 2022-05-31 DIAGNOSIS — F329 Major depressive disorder, single episode, unspecified: Secondary | ICD-10-CM | POA: Insufficient documentation

## 2022-05-31 NOTE — Assessment & Plan Note (Signed)
Allopurinol refilled. Discuss with nephrologist in regards to dialysis days

## 2022-05-31 NOTE — Assessment & Plan Note (Addendum)
Discussed therapy including group therapy. He has been checking into some online group therapy. Referral to psychologist.

## 2022-05-31 NOTE — Assessment & Plan Note (Signed)
On warfarin. Followed by cardiologist.

## 2022-05-31 NOTE — Assessment & Plan Note (Addendum)
Hemodialysis schedule T, Th, Sat.  Currently has a portacath

## 2022-05-31 NOTE — Assessment & Plan Note (Signed)
Followed by cardiology coumadin clinic.

## 2022-06-02 DIAGNOSIS — Z992 Dependence on renal dialysis: Secondary | ICD-10-CM | POA: Diagnosis not present

## 2022-06-02 DIAGNOSIS — N186 End stage renal disease: Secondary | ICD-10-CM | POA: Insufficient documentation

## 2022-06-02 DIAGNOSIS — N2581 Secondary hyperparathyroidism of renal origin: Secondary | ICD-10-CM | POA: Diagnosis not present

## 2022-06-03 LAB — ANTI-NUCLEAR AB-TITER (ANA TITER): ANA Titer 1: 1:320 {titer} — ABNORMAL HIGH

## 2022-06-03 LAB — CERULOPLASMIN: Ceruloplasmin: 24 mg/dL (ref 18–36)

## 2022-06-03 LAB — ANTI-SMOOTH MUSCLE ANTIBODY, IGG: Actin (Smooth Muscle) Antibody (IGG): 21 U — ABNORMAL HIGH (ref <20)

## 2022-06-03 LAB — ANA: Anti Nuclear Antibody (ANA): POSITIVE — AB

## 2022-06-03 LAB — ALPHA-1-ANTITRYPSIN: A-1 Antitrypsin, Ser: 133 mg/dL (ref 83–199)

## 2022-06-03 LAB — AFP TUMOR MARKER: AFP-Tumor Marker: 2.2 ng/mL (ref <6.1)

## 2022-06-03 LAB — MITOCHONDRIAL ANTIBODIES: Mitochondrial M2 Ab, IgG: <=20 U (ref <=20.0)

## 2022-06-03 LAB — IGG: IgG (Immunoglobin G), Serum: 1509 mg/dL (ref 600–1540)

## 2022-06-04 DIAGNOSIS — N2581 Secondary hyperparathyroidism of renal origin: Secondary | ICD-10-CM | POA: Diagnosis not present

## 2022-06-04 DIAGNOSIS — Z992 Dependence on renal dialysis: Secondary | ICD-10-CM | POA: Diagnosis not present

## 2022-06-04 DIAGNOSIS — N186 End stage renal disease: Secondary | ICD-10-CM | POA: Diagnosis not present

## 2022-06-05 ENCOUNTER — Ambulatory Visit (INDEPENDENT_AMBULATORY_CARE_PROVIDER_SITE_OTHER): Payer: BC Managed Care – PPO

## 2022-06-05 DIAGNOSIS — Z7901 Long term (current) use of anticoagulants: Secondary | ICD-10-CM

## 2022-06-05 DIAGNOSIS — I4891 Unspecified atrial fibrillation: Secondary | ICD-10-CM

## 2022-06-05 LAB — POCT INR: INR: 1.5 — AB (ref 2.0–3.0)

## 2022-06-05 NOTE — Patient Instructions (Signed)
Description   START taking 1 tablet daily EXCEPT 0.5 tablet on Wednesdays.   Recheck INR in 1 week. [facility]in Clinic [phone].

## 2022-06-06 DIAGNOSIS — N2581 Secondary hyperparathyroidism of renal origin: Secondary | ICD-10-CM | POA: Diagnosis not present

## 2022-06-06 DIAGNOSIS — N186 End stage renal disease: Secondary | ICD-10-CM | POA: Diagnosis not present

## 2022-06-06 DIAGNOSIS — Z992 Dependence on renal dialysis: Secondary | ICD-10-CM | POA: Diagnosis not present

## 2022-06-07 ENCOUNTER — Other Ambulatory Visit: Payer: Self-pay | Admitting: Cardiovascular Disease

## 2022-06-07 DIAGNOSIS — I4891 Unspecified atrial fibrillation: Secondary | ICD-10-CM

## 2022-06-08 ENCOUNTER — Encounter: Payer: Self-pay | Admitting: Orthopedic Surgery

## 2022-06-08 DIAGNOSIS — Z89511 Acquired absence of right leg below knee: Secondary | ICD-10-CM

## 2022-06-09 ENCOUNTER — Ambulatory Visit (INDEPENDENT_AMBULATORY_CARE_PROVIDER_SITE_OTHER): Payer: BC Managed Care – PPO

## 2022-06-09 ENCOUNTER — Other Ambulatory Visit: Payer: Self-pay | Admitting: Internal Medicine

## 2022-06-09 ENCOUNTER — Telehealth: Payer: Self-pay

## 2022-06-09 ENCOUNTER — Encounter: Payer: Self-pay | Admitting: Family Medicine

## 2022-06-09 ENCOUNTER — Ambulatory Visit (INDEPENDENT_AMBULATORY_CARE_PROVIDER_SITE_OTHER): Payer: BC Managed Care – PPO | Admitting: Family Medicine

## 2022-06-09 VITALS — BP 112/70 | HR 103 | Temp 99.5°F

## 2022-06-09 DIAGNOSIS — R110 Nausea: Secondary | ICD-10-CM | POA: Diagnosis not present

## 2022-06-09 DIAGNOSIS — R059 Cough, unspecified: Secondary | ICD-10-CM | POA: Diagnosis not present

## 2022-06-09 DIAGNOSIS — R6883 Chills (without fever): Secondary | ICD-10-CM

## 2022-06-09 DIAGNOSIS — Z992 Dependence on renal dialysis: Secondary | ICD-10-CM

## 2022-06-09 DIAGNOSIS — R142 Eructation: Secondary | ICD-10-CM

## 2022-06-09 DIAGNOSIS — N185 Chronic kidney disease, stage 5: Secondary | ICD-10-CM

## 2022-06-09 DIAGNOSIS — R45851 Suicidal ideations: Secondary | ICD-10-CM | POA: Diagnosis not present

## 2022-06-09 DIAGNOSIS — R051 Acute cough: Secondary | ICD-10-CM

## 2022-06-09 DIAGNOSIS — D509 Iron deficiency anemia, unspecified: Secondary | ICD-10-CM

## 2022-06-09 DIAGNOSIS — F322 Major depressive disorder, single episode, severe without psychotic features: Secondary | ICD-10-CM

## 2022-06-09 DIAGNOSIS — I4891 Unspecified atrial fibrillation: Secondary | ICD-10-CM

## 2022-06-09 LAB — CBC WITH DIFFERENTIAL/PLATELET
Basophils Absolute: 0 10*3/uL (ref 0.0–0.1)
Basophils Relative: 0.3 % (ref 0.0–3.0)
Eosinophils Absolute: 0.1 10*3/uL (ref 0.0–0.7)
Eosinophils Relative: 0.8 % (ref 0.0–5.0)
HCT: 21.6 % — CL (ref 39.0–52.0)
Hemoglobin: 7.4 g/dL — CL (ref 13.0–17.0)
Lymphocytes Relative: 10.2 % — ABNORMAL LOW (ref 12.0–46.0)
Lymphs Abs: 1.1 10*3/uL (ref 0.7–4.0)
MCHC: 34.5 g/dL (ref 30.0–36.0)
MCV: 94 fl (ref 78.0–100.0)
Monocytes Absolute: 0.8 10*3/uL (ref 0.1–1.0)
Monocytes Relative: 7.4 % (ref 3.0–12.0)
Neutro Abs: 8.4 10*3/uL — ABNORMAL HIGH (ref 1.4–7.7)
Neutrophils Relative %: 81.3 % — ABNORMAL HIGH (ref 43.0–77.0)
Platelets: 240 10*3/uL (ref 150.0–400.0)
RBC: 2.29 Mil/uL — ABNORMAL LOW (ref 4.22–5.81)
RDW: 18 % — ABNORMAL HIGH (ref 11.5–15.5)
WBC: 10.4 10*3/uL (ref 4.0–10.5)

## 2022-06-09 LAB — COMPREHENSIVE METABOLIC PANEL
ALT: 6 U/L (ref 0–53)
AST: 11 U/L (ref 0–37)
Albumin: 4 g/dL (ref 3.5–5.2)
Alkaline Phosphatase: 88 U/L (ref 39–117)
BUN: 36 mg/dL — ABNORMAL HIGH (ref 6–23)
CO2: 31 mEq/L (ref 19–32)
Calcium: 9.5 mg/dL (ref 8.4–10.5)
Chloride: 97 mEq/L (ref 96–112)
Creatinine, Ser: 5.69 mg/dL (ref 0.40–1.50)
GFR: 10.1 mL/min — CL (ref >60.00)
Glucose, Bld: 101 mg/dL — ABNORMAL HIGH (ref 70–99)
Potassium: 3.1 mEq/L — ABNORMAL LOW (ref 3.5–5.1)
Sodium: 139 mEq/L (ref 135–145)
Total Bilirubin: 0.6 mg/dL (ref 0.2–1.2)
Total Protein: 7.4 g/dL (ref 6.0–8.3)

## 2022-06-09 MED ORDER — ESCITALOPRAM OXALATE 10 MG PO TABS: 10.0000 mg | ORAL_TABLET | Freq: Every day | ORAL | 2 refills | Status: DC

## 2022-06-09 NOTE — Assessment & Plan Note (Signed)
GI visit 3 weeks ago for belching and nausea and increased Protonix to bid without any relief. Advised follow up with GI. Check labs.

## 2022-06-09 NOTE — Telephone Encounter (Signed)
Creatinine 5.69 and GFR 10.10 from [place] from [facility]. Spoke to Dr.Jones and advised that chart was sent.

## 2022-06-09 NOTE — Progress Notes (Signed)
Subjective:     Patient ID: Jeffrey Ayala, male    DOB: [birth year], 61 y.o.   MRN: [id number]  Chief Complaint  Patient presents with   Chills   Cough    With clear phlegm after eating and emesis sometimes, belching alot   Fatigue    No energy for last few days    HPI Patient is in today accompanied by his wife for complaints of a 3 day hx of chills, fatigue, nausea, and belching. States nausea and belching is worse when he is moving around. He saw GI and was advised to increase Protonix to bid which he has done without any relief.  He has Zofran at home but has not tried it.   Hx of sepsis. S/P right BKA and transmetatarsal amputation of left foot. In wheelchair.   Coughing up clear mucus for the past few days.  Vomiting after dinner for the past several days as well. Denies abdominal pain.   No fever, dizziness, headache, chest pain, palpitations, shortness of breath, diarrhea, urinary symptoms.   Denies pain   [facility] for HD. Normal schedule is T,Th, Sat but he did not go today. He will have appt in the morning.   Severe depression and suicidal thoughts since waking up from coma in the hospital with amputations and new health issues. States he has Seroquel at home but is not taking it. He has not wanted to see a mental health specialist.  Wife states she locked up all of the guns and they are out of his reach.       Health Maintenance Due  Topic Date Due   Hepatitis C Screening  Never done   COLONOSCOPY (Pts 45-48yr Insurance coverage will need to be confirmed)  Never done   Zoster Vaccines- Shingrix (1 of 2) Never done    Past Medical History:  Diagnosis Date   Concussion    Eosinophilic esophagitis    EGD/bx by Dr. AHavery Moros  Gastric ulcer    History of kidney stones    Intermittent dysphagia    Migraine     Past Surgical History:  Procedure Laterality Date   AMPUTATION Right 03/15/2022   Procedure: RIGHT BELOW KNEE AMPUTATION;  Surgeon:  DNewt Minion MD;  [place]  Service: Orthopedics;  Laterality: Right;   CYSTOSCOPY     DENTAL SURGERY     FINGER SURGERY     HERNIA REPAIR     at age 61  IR FLUORO GUIDE CV LINE RIGHT  03/09/2022   IR PATIENT EVAL TECH 0-60 MINS  03/09/2022   IR [place] VASC ACCESS RIGHT  03/09/2022   KNEE ARTHROSCOPY Left    LITHOTRIPSY     NEPHROLITHOTOMY     SCROTAL EXPLORATION N/A 03/16/2022   Procedure: IRRIGATION AND DEBRIDEMENT SCROTUM;  Surgeon: BRaynelle Bring MD;  [place]  Service: Urology;  Laterality: N/A;   SCROTAL EXPLORATION N/A 03/28/2022   Procedure: IRRIGATION AND DEBRIDEMENT SCROTUM;  Surgeon: HArdis Hughs MD;  [place]  Service: Urology;  Laterality: N/A;   TONSILLECTOMY     TOTAL KNEE ARTHROPLASTY Bilateral 11/04/2018   Procedure: BILATERAL TOTAL KNEE ARTHROPLASTY;  Surgeon: DNewt Minion MD;  [place]  Service: Orthopedics;  Laterality: Bilateral;  spinal/epidural per anesthesiologist   TRANSMETATARSAL AMPUTATION Left 03/15/2022   Procedure: LEFT TRANSMETATARSAL AMPUTATION;  Surgeon: DNewt Minion MD;  Location[place]  Service: Orthopedics;  Laterality: Left;  Family History  Problem Relation Age of Onset   Arrhythmia Mother    COPD Mother    Sarcoidosis Brother    Cancer Brother    Stomach cancer Neg Hx    Pancreatic cancer Neg Hx    Esophageal cancer Neg Hx    Colon cancer Neg Hx    Rectal cancer Neg Hx     Social History   Socioeconomic History   Marital status: Married    Spouse name: Not on file   Number of children: 2   Years of education: Not on file   Highest education level: Not on file  Occupational History   Occupation: Press photographer - Clinical cytogeneticist  Tobacco Use   Smoking status: Never   Smokeless tobacco: Never  Vaping Use   Vaping Use: Never used  Substance and Sexual Activity   Alcohol use: Not Currently    Comment: occassional   Drug use: No   Sexual activity: Not Currently  Other Topics Concern    Not on file  Social History Narrative   Not on file   Social Determinants of Health   Financial Resource Strain: Not on file  Food Insecurity: Not on file  Transportation Needs: Not on file  Physical Activity: Not on file  Stress: Not on file  Social Connections: Not on file  Intimate Partner Violence: Not on file    Outpatient Medications Prior to Visit  Medication Sig Dispense Refill   acetaminophen (TYLENOL) 650 MG CR tablet Take 650 mg by mouth every 8 (eight) hours as needed for pain or fever.     allopurinol (ZYLOPRIM) 100 MG tablet Take 1 tablet (100 mg total) by mouth daily. 30 tablet 3   lanthanum (FOSRENOL) 1000 MG chewable tablet Chew 1 tablet (1,000 mg total) by mouth 3 (three) times daily with meals. 90 tablet 0   metoprolol tartrate (LOPRESSOR) 25 MG tablet Take 0.5 tablets (12.5 mg total) by mouth 2 (two) times daily. 90 tablet 3   multivitamin (RENA-VIT) TABS tablet Take 1 tablet by mouth at bedtime. 30 tablet 0   pantoprazole (PROTONIX) 40 MG tablet Take 1 tablet (40 mg total) by mouth 2 (two) times daily. 180 tablet 3   simethicone (MYLICON) 80 MG chewable tablet Chew 1 tablet (80 mg total) by mouth 4 (four) times daily as needed for flatulence. 30 tablet 0   traZODone (DESYREL) 50 MG tablet Take 0.5-1 tablets (25-50 mg total) by mouth at bedtime as needed for sleep. 30 tablet 2   warfarin (COUMADIN) 5 MG tablet TAKE 1 TABLET (5 MG TOTAL) BY MOUTH DAILY. 30 tablet 0   No facility-administered medications prior to visit.    Allergies  Allergen Reactions   Codeine Hives    Insomnia, jittery   Flomax [Tamsulosin Hcl] Nausea Only    Nausea and fatigue    ROS     Objective:    Physical Exam Constitutional:      General: He is not in acute distress.    Appearance: He is ill-appearing. He is not toxic-appearing or diaphoretic.  HENT:     Mouth/Throat:     Mouth: Mucous membranes are moist.  Eyes:     Conjunctiva/sclera: Conjunctivae normal.     Pupils:  Pupils are equal, round, and reactive to light.  Cardiovascular:     Rate and Rhythm: Normal rate. Rhythm irregular.  Pulmonary:     Effort: Pulmonary effort is normal.     Breath sounds: Normal breath sounds.  Skin:  General: Skin is warm and dry.  Neurological:     Mental Status: He is alert and oriented to person, place, and time. Mental status is at baseline.  Psychiatric:        Attention and Perception: Attention normal.        Mood and Affect: Mood is depressed.        Speech: Speech normal.        Thought Content: Thought content does not include suicidal plan.     Comments: SI due to health, no plan     BP 112/70 (BP Location: Left Arm, Patient Position: Sitting, Cuff Size: Large)   Pulse (!) 103   Temp 99.5 F (37.5 C) (Oral)   SpO2 96%  Wt Readings from Last 3 Encounters:  05/13/22 196 lb 3.2 oz (89 kg)  04/27/22 190 lb (86.2 kg)  04/27/22 190 lb (86.2 kg)       Assessment & Plan:   Problem List Items Addressed This Visit       Cardiovascular and Mediastinum   Atrial fibrillation ([name])     Genitourinary   CKD (chronic kidney disease) stage 5, GFR less than 15 ml/min ([name])    Patient labs stable including GFR 10 . He has hemodialysis tomorrow morning.          Other   Acute cough    No acute distress. Will check labs and chest XR to look for signs of infection.       Relevant Orders   CBC with Differential/Platelet (Completed)   Comprehensive metabolic panel (Completed)   DG Chest 2 View   Belching    GI visit 3 weeks ago for belching and nausea and increased Protonix to bid without any relief. Advised follow up with GI. Check labs.       Chills (without fever) - Primary    Afebrile at home. No obvious infection today on exam. Labs and chest XR ordered.       Relevant Orders   CBC with Differential/Platelet (Completed)   Comprehensive metabolic panel (Completed)   DG Chest 2 View   Dependence on renal dialysis ([place])   Iron deficiency  anemia, unspecified    Called patient and wife to let them know about his Hgb today of 7.4. Wife, [name],  reports his previous Hgb at nephrologist/dailysis was 7.0. Patient's wife reports they have been referred to hematologist for anemia. They will ask nephrologist to review today's labs results in the morning prior to dialysis.       Nausea    Try Zofran, he has this at home.  GI visit 3 weeks ago for nausea and increased Protonix to bid without any relief. Advised follow up with GI. Check labs.       Relevant Orders   CBC with Differential/Platelet (Completed)   Comprehensive metabolic panel (Completed)   Severe depression ([place])    Prior to recent hospitalization, he was in sales and a people person. Since hospitalization he has been severely depressed with deteriorating health and has not wanted to talk to a therapist or psychiatrist or take medications for mood.  He is willing to start on Lexapro and I sent this to his pharmacy. He is also amenable to reaching out to [facility], he previously declined their referral. Recommend close follow up.       Relevant Medications   escitalopram (LEXAPRO) 10 MG tablet   Suicidal ideations    No plan to harm himself. His wife has removed weapons  from home where he could access them. Offered to get him help with inpatient treatment today and he declines. Is now amenable to seeing behavioral health and starting on medication for depression. Close follow up needed.        I am having [employer] start on escitalopram. I am also having him maintain his acetaminophen, simethicone, multivitamin, lanthanum, metoprolol tartrate, traZODone, allopurinol, pantoprazole, and warfarin.  Meds ordered this encounter  Medications   escitalopram (LEXAPRO) 10 MG tablet    Sig: Take 1 tablet (10 mg total) by mouth daily.    Dispense:  30 tablet    Refill:  2    Order Specific Question:   Supervising Provider    Answer:   Pricilla Holm A [[id number]]

## 2022-06-09 NOTE — Assessment & Plan Note (Signed)
No acute distress. Will check labs and chest XR to look for signs of infection.

## 2022-06-09 NOTE — Assessment & Plan Note (Signed)
No plan to harm himself. His wife has removed weapons from home where he could access them. Offered to get him help with inpatient treatment today and he declines. Is now amenable to seeing behavioral health and starting on medication for depression. Close follow up needed.

## 2022-06-09 NOTE — Assessment & Plan Note (Signed)
Try Zofran, he has this at home.  GI visit 3 weeks ago for nausea and increased Protonix to bid without any relief. Advised follow up with GI. Check labs.

## 2022-06-09 NOTE — Telephone Encounter (Signed)
Critical HGB of 7.4 and hematocrit of 21.6. Routed to PCP.

## 2022-06-09 NOTE — Assessment & Plan Note (Signed)
Patient labs stable including GFR 10 . He has hemodialysis tomorrow morning.

## 2022-06-09 NOTE — Assessment & Plan Note (Signed)
Prior to recent hospitalization, he was in sales and a people person. Since hospitalization he has been severely depressed with deteriorating health and has not wanted to talk to a therapist or psychiatrist or take medications for mood.  He is willing to start on Lexapro and I sent this to his pharmacy. He is also amenable to reaching out to [facility], he previously declined their referral. Recommend close follow up.

## 2022-06-09 NOTE — Patient Instructions (Signed)
Try Zofran for nausea.   Follow up with GI for worsening belching, nausea and not eating.   I will ask the psychiatrist to reach out to you again.

## 2022-06-09 NOTE — Assessment & Plan Note (Signed)
Afebrile at home. No obvious infection today on exam. Labs and chest XR ordered.

## 2022-06-09 NOTE — Assessment & Plan Note (Signed)
Called patient and wife to let them know about his Hgb today of 7.4. Wife, Jeffrey Ayala,  reports his previous Hgb at nephrologist/dailysis was 7.0. Patient's wife reports they have been referred to hematologist for anemia. They will ask nephrologist to review today's labs results in the morning prior to dialysis.

## 2022-06-09 NOTE — Telephone Encounter (Signed)
Chart Review

## 2022-06-10 DIAGNOSIS — N2581 Secondary hyperparathyroidism of renal origin: Secondary | ICD-10-CM | POA: Diagnosis not present

## 2022-06-10 DIAGNOSIS — Z992 Dependence on renal dialysis: Secondary | ICD-10-CM | POA: Diagnosis not present

## 2022-06-10 DIAGNOSIS — N186 End stage renal disease: Secondary | ICD-10-CM | POA: Diagnosis not present

## 2022-06-13 DIAGNOSIS — Z992 Dependence on renal dialysis: Secondary | ICD-10-CM | POA: Diagnosis not present

## 2022-06-13 DIAGNOSIS — N186 End stage renal disease: Secondary | ICD-10-CM | POA: Diagnosis not present

## 2022-06-13 DIAGNOSIS — N2581 Secondary hyperparathyroidism of renal origin: Secondary | ICD-10-CM | POA: Diagnosis not present

## 2022-06-15 ENCOUNTER — Ambulatory Visit (INDEPENDENT_AMBULATORY_CARE_PROVIDER_SITE_OTHER): Payer: BC Managed Care – PPO | Admitting: *Deleted

## 2022-06-15 DIAGNOSIS — Z5181 Encounter for therapeutic drug level monitoring: Secondary | ICD-10-CM

## 2022-06-15 DIAGNOSIS — I4891 Unspecified atrial fibrillation: Secondary | ICD-10-CM

## 2022-06-15 DIAGNOSIS — S3130XA Unspecified open wound of scrotum and testes, initial encounter: Secondary | ICD-10-CM | POA: Diagnosis not present

## 2022-06-15 DIAGNOSIS — Z7901 Long term (current) use of anticoagulants: Secondary | ICD-10-CM

## 2022-06-15 DIAGNOSIS — S069X9A Unspecified intracranial injury with loss of consciousness of unspecified duration, initial encounter: Secondary | ICD-10-CM | POA: Diagnosis not present

## 2022-06-15 DIAGNOSIS — Z89511 Acquired absence of right leg below knee: Secondary | ICD-10-CM | POA: Diagnosis not present

## 2022-06-15 LAB — POCT INR: INR: 2.7 (ref 2.0–3.0)

## 2022-06-15 NOTE — Patient Instructions (Signed)
Description   Continue taking 1 tablet daily EXCEPT 0.5 tablet on Wednesdays.   Recheck INR in 1 week. [facility] [phone].

## 2022-06-16 DIAGNOSIS — N2581 Secondary hyperparathyroidism of renal origin: Secondary | ICD-10-CM | POA: Diagnosis not present

## 2022-06-16 DIAGNOSIS — Z23 Encounter for immunization: Secondary | ICD-10-CM | POA: Diagnosis not present

## 2022-06-16 DIAGNOSIS — Z992 Dependence on renal dialysis: Secondary | ICD-10-CM | POA: Diagnosis not present

## 2022-06-16 DIAGNOSIS — N186 End stage renal disease: Secondary | ICD-10-CM | POA: Diagnosis not present

## 2022-06-17 ENCOUNTER — Encounter: Payer: Self-pay | Admitting: Cardiovascular Disease

## 2022-06-17 ENCOUNTER — Telehealth: Payer: Self-pay | Admitting: Oncology

## 2022-06-17 NOTE — Telephone Encounter (Signed)
Scheduled appt per 7/26 referral. Pt's wife is aware of appt date and time. Pt wife is aware to arrive 15 mins prior to appt time and to bring and updated insurance card. Pt wife is aware of appt location.

## 2022-06-17 NOTE — Telephone Encounter (Signed)
Spoke with wife, after speaking with Dr.O'Neal- he suggested with blood levels this low to stop all blood thinners and be seen in the ER. I will send this message over to him to advise on the Metoprolol.   Thank you!

## 2022-06-18 DIAGNOSIS — N2581 Secondary hyperparathyroidism of renal origin: Secondary | ICD-10-CM | POA: Diagnosis not present

## 2022-06-18 DIAGNOSIS — Z992 Dependence on renal dialysis: Secondary | ICD-10-CM | POA: Diagnosis not present

## 2022-06-18 DIAGNOSIS — N186 End stage renal disease: Secondary | ICD-10-CM | POA: Diagnosis not present

## 2022-06-18 DIAGNOSIS — Z23 Encounter for immunization: Secondary | ICD-10-CM | POA: Diagnosis not present

## 2022-06-19 DIAGNOSIS — Z89511 Acquired absence of right leg below knee: Secondary | ICD-10-CM | POA: Diagnosis not present

## 2022-06-20 DIAGNOSIS — Z992 Dependence on renal dialysis: Secondary | ICD-10-CM | POA: Diagnosis not present

## 2022-06-20 DIAGNOSIS — N186 End stage renal disease: Secondary | ICD-10-CM | POA: Diagnosis not present

## 2022-06-20 DIAGNOSIS — N2581 Secondary hyperparathyroidism of renal origin: Secondary | ICD-10-CM | POA: Diagnosis not present

## 2022-06-20 DIAGNOSIS — Z23 Encounter for immunization: Secondary | ICD-10-CM | POA: Diagnosis not present

## 2022-06-22 ENCOUNTER — Encounter: Payer: Self-pay | Admitting: Physical Therapy

## 2022-06-22 ENCOUNTER — Ambulatory Visit (INDEPENDENT_AMBULATORY_CARE_PROVIDER_SITE_OTHER): Payer: BC Managed Care – PPO | Admitting: Physical Therapy

## 2022-06-22 ENCOUNTER — Other Ambulatory Visit: Payer: Self-pay

## 2022-06-22 DIAGNOSIS — M6281 Muscle weakness (generalized): Secondary | ICD-10-CM

## 2022-06-22 DIAGNOSIS — R609 Edema, unspecified: Secondary | ICD-10-CM

## 2022-06-22 DIAGNOSIS — L98498 Non-pressure chronic ulcer of skin of other sites with other specified severity: Secondary | ICD-10-CM | POA: Diagnosis not present

## 2022-06-22 DIAGNOSIS — N170 Acute kidney failure with tubular necrosis: Secondary | ICD-10-CM | POA: Diagnosis not present

## 2022-06-22 DIAGNOSIS — R2681 Unsteadiness on feet: Secondary | ICD-10-CM

## 2022-06-22 DIAGNOSIS — M21372 Foot drop, left foot: Secondary | ICD-10-CM

## 2022-06-22 DIAGNOSIS — R2689 Other abnormalities of gait and mobility: Secondary | ICD-10-CM | POA: Diagnosis not present

## 2022-06-22 DIAGNOSIS — Z992 Dependence on renal dialysis: Secondary | ICD-10-CM | POA: Diagnosis not present

## 2022-06-22 DIAGNOSIS — N186 End stage renal disease: Secondary | ICD-10-CM | POA: Diagnosis not present

## 2022-06-22 NOTE — Therapy (Signed)
OUTPATIENT PHYSICAL THERAPY PROSTHETICS EVALUATION   Patient Name: Jeffrey Ayala MRN: [id number] DOB:[birth year], 61 y.o., male Today's Date: 06/22/2022  PCP: [name], NP-C REFERRING PROVIDER: [name], MD   PT End of Session - 06/22/22 [id number]     Visit Number 1    Number of Visits 20    Authorization Type [employer] comm PPO    Authorization Time Period 20 visit limit PT/OT/chiro, $35 co-pay    Authorization - Visit Number 1    Authorization - Number of Visits 20    Progress Note Due on Visit 10    PT Start Time 0849    PT Stop Time 0929    PT Time Calculation (min) 40 min    Equipment Utilized During Treatment Gait belt    Activity Tolerance Patient tolerated treatment well;Patient limited by fatigue    Behavior During Therapy WFL for tasks assessed/performed;Flat affect             Past Medical History:  Diagnosis Date   Concussion    Eosinophilic esophagitis    EGD/bx by Dr. Havery Moros   Gastric ulcer    History of kidney stones    Intermittent dysphagia    Migraine    Past Surgical History:  Procedure Laterality Date   AMPUTATION Right 03/15/2022   Procedure: RIGHT BELOW KNEE AMPUTATION;  Surgeon: [name], MD;  Location: [facility];  Service: Orthopedics;  Laterality: Right;   CYSTOSCOPY     DENTAL SURGERY     FINGER SURGERY     HERNIA REPAIR     at age 29   IR FLUORO GUIDE CV LINE RIGHT  03/09/2022   IR PATIENT EVAL TECH 0-60 MINS  03/09/2022   IR US GUIDE VASC ACCESS RIGHT  03/09/2022   KNEE ARTHROSCOPY Left    LITHOTRIPSY     NEPHROLITHOTOMY     SCROTAL EXPLORATION N/A 03/16/2022   Procedure: IRRIGATION AND DEBRIDEMENT SCROTUM;  Surgeon: Raynelle Bring, MD;  Location: [facility];  Service: Urology;  Laterality: N/A;   SCROTAL EXPLORATION N/A 03/28/2022   Procedure: IRRIGATION AND DEBRIDEMENT SCROTUM;  Surgeon: Ardis Hughs, MD;  Location: [place];  Service: Urology;  Laterality: N/A;   TONSILLECTOMY     TOTAL KNEE ARTHROPLASTY Bilateral  11/04/2018   Procedure: BILATERAL TOTAL KNEE ARTHROPLASTY;  Surgeon: [name], MD;  Location: [place];  Service: Orthopedics;  Laterality: Bilateral;  spinal/epidural per anesthesiologist   TRANSMETATARSAL AMPUTATION Left 03/15/2022   Procedure: LEFT TRANSMETATARSAL AMPUTATION;  Surgeon: [name], MD;  Location: [place];  Service: Orthopedics;  Laterality: Left;   Patient Active Problem List   Diagnosis Date Noted   Suicidal ideations 06/09/2022   Chills (without fever) 06/09/2022   Severe depression ([place]) 06/09/2022   Acute cough 06/09/2022   Reactive depression 05/31/2022   Belching 05/27/2022   Gastroesophageal reflux disease 05/27/2022   Atrial fibrillation ([place]) 05/20/2022   Long term (current) use of anticoagulants 05/20/2022   S/P transmetatarsal amputation of foot, left ([place]) 04/18/2022   CKD (chronic kidney disease) stage 5, GFR less than 15 ml/min ([place]) 04/18/2022   Secondary hyperparathyroidism of renal origin ([place]) 04/10/2022   Pruritus, unspecified 04/10/2022   Nausea 04/10/2022   Iron deficiency anemia, unspecified 04/10/2022   Hypothyroidism, unspecified 04/10/2022   Other acute kidney failure ([place]) 04/10/2022   Dyspnea, unspecified 42/70/6237   Eosinophilic esophagitis 62/83/1517   Gastric ulcer, unspecified as acute or chronic, without hemorrhage or perforation 04/10/2022  Coagulation defect, unspecified ([place]) 04/10/2022   Allergy, unspecified, initial encounter 04/10/2022   Anaphylactic shock, unspecified, initial encounter 04/10/2022   Arthralgia of right lower leg    Acute blood loss anemia    Unilateral complete BKA, right, initial encounter ([place]) 04/03/2022   S/P BKA (below knee amputation), right ([place]) 04/03/2022   Old myocardial infarction 04/02/2022   Dependence on renal dialysis ([place]) 04/02/2022   Acquired absence of right leg below knee ([place]) 04/02/2022   Cirrhosis ([place]) 03/31/2022   Fournier's gangrene in male    Scrotal infection 03/25/2022    Normocytic anemia 03/25/2022   Diarrhea 03/25/2022   Shock liver 03/25/2022   Acute respiratory failure with hypoxia ([place]) 03/25/2022   Gout 03/25/2022   Acute nonintractable headache 03/25/2022   Demand ischemia ([place]) 03/25/2022   Thrombocytopenia ([place]) 03/25/2022   Obesity, Class III, BMI 40-49.9 (morbid obesity) ([place]) 03/25/2022   Cardiac arrest ([place]) 03/25/2022   Hypotension    Respiratory insufficiency    Cellulitis    Malnutrition of moderate degree 03/12/2022   Altered mental status    Gangrene of right foot ([place])    Gangrene of left foot ([place])    Severe protein-calorie malnutrition ([place]) 03/10/2022   Encounter for central line placement    Pressure injury of skin 02/23/2022   Septic shock ([place]) 02/22/2022   AKI (acute kidney injury) ([place])    Generalized abdominal pain    Lactic acidosis    Atrial fibrillation with RVR ([name])    Hypokalemia    Hypomagnesemia    Pain in finger of right hand 08/14/2021   Degenerative joint disease of shoulder region 05/07/2020   Olecranon bursitis of right elbow 05/07/2020   Pain in joint of right shoulder 04/26/2020   S/P TKR (total knee replacement), bilateral 11/04/2018   Bilateral primary osteoarthritis of knee    DDD (degenerative disc disease), cervical 01/10/2018   Rupture of medial head of gastrocnemius, left, sequela 01/14/2017   Kidney stones 06/22/2016   Essential hypertension 08/01/2010   DYSTHYMIC DISORDER 07/08/2010   PALPITATIONS 07/08/2010    ONSET DATE: 06/19/2022 Prosthesis delivery  REFERRING DIAG: Z89.511 (ICD-10-CM) - Hx of below knee amputation, right   THERAPY DIAG:  Other abnormalities of gait and mobility  Muscle weakness (generalized)  Unsteadiness on feet  Non-pressure chronic ulcer of skin of other sites with other specified severity ([name])  Edema, unspecified type  Foot drop, left  Rationale for Evaluation and Treatment Rehabilitation  SUBJECTIVE:   SUBJECTIVE STATEMENT: He underwent a  right Transtibial Amputation & left Transmetatarsal Amputation on 03/15/2022. He also started dialysis in April 2023. He has been w/c bound since 03/15/2022.  He recently was diagnosed with ESRD so dialysis center is working towards getting Medicare activated.  He received TTA prosthesis on 06/19/2022 and shoe filler with carbon plate ~month earlier.  Pt accompanied by: significant other  PERTINENT HISTORY:  Rt BKA 03/15/22, Left MTA 03/15/2022, bil. TKA 11/04/18, ESRD / chronic kidney disease st5 dialysis Tues/Thurs/Sat, cirrhosis, cardiac arrest May 23 post intubation, A-Fib  PAIN:   Are you having pain? No  PRECAUTIONS: Fall  WEIGHT BEARING RESTRICTIONS No  FALLS: Has patient fallen in last 6 months? 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    PATIENT GOALS  Walk in community and going to car shows, return to work, camping with bumper pull  OBJECTIVE:   COGNITION: Overall cognitive status: Within functional limits for tasks assessed and pt had flat affect so unable to fully assess cognition.  POSTURE: rounded shoulders, forward head, flexed trunk , and weight shift left  LOWER EXTREMITY ROM:  ROM P:passive  A:active Right eval Left eval  Hip flexion    Hip extension Standing A: ~30* limitation Standing A: ~30* limitation  Hip abduction    Hip adduction    Hip internal rotation    Hip external rotation    Knee flexion    Knee extension Standing A:  ~15* limitation Standing A: ~15* limitation  Ankle dorsiflexion  Standing A: ~25* limitation  Ankle plantarflexion    Ankle inversion    Ankle eversion     (Blank rows = not tested)  LOWER EXTREMITY MMT:  MMT Right eval Left eval  Hip flexion 4/5 4/5  Hip extension 2/5 functional 2/5 functional  Hip abduction 2/5 functional 2/5 functional  Hip adduction    Hip internal rotation    Hip external rotation    Knee flexion    Knee extension 3-/5 functional 3-/5 functional  Ankle dorsiflexion 2-/5   Ankle plantarflexion    Ankle inversion    Ankle eversion    (Blank rows = not tested)  TRANSFERS: Sit to stand: Mod A and from 22" w/c requiring armrests to push & RW to stabilize Stand to sit: Min A from RW to 22" w/c unable to release RW to reach for w/c until seated.  GAIT: Gait pattern: step to pattern, decreased step length- Left, decreased stance time- Right, decreased stride length, decreased hip/knee flexion- Right, circumduction- Right, Right hip hike, knee flexed in stance- Right, knee flexed in stance- Left, antalgic, trunk flexed, abducted- Right, and poor foot clearance- Left excessive weightbearing BUEs on RW Distance walked: 5' Assistive device utilized: Environmental consultant - 2 wheeled and right TTA prosthesis / left plate & shoe filler Level of assistance: Mod A Comments: post gait HR 82, SpO2 99% with dyspnea noted  FUNCTIONAL TESTs:  Static standing balance with BUE support on RW for 1 min with minA.   CURRENT PROSTHETIC WEAR ASSESSMENT: Patient is dependent with: skin check, residual limb care, care of non-amputated limb, prosthetic cleaning, ply sock cleaning, correct ply sock adjustment, proper wear schedule/adjustment, and proper weight-bearing schedule/adjustment Donning prosthesis: Total A and max cues required, Doffing prosthesis: Total A and wife able to doffe Prosthetic wear tolerance: 1-2 hours 1-2x/day, for 3 of 3 days since delivery.  Prosthetic weight bearing  tolerance: 0 minutes unable to stand outside of PT Edema: pitting with >5sec capillary refill Residual limb condition: 50m wound with serous drainage & black scab present,  calf measurement is 42.2cm Prosthetic description:  silicon liner with pin suspension, flexible inner socket Total Contact design, dynamic response foot with hydraulic PF/DF K code/activity level with prosthetic use: Level 3   TODAY'S TREATMENT:   PATIENT EDUCATION: PATIENT EDUCATED ON FOLLOWING PROSTHETIC CARE:  Skin check, Residual limb care, Prosthetic cleaning, Correct ply sock adjustment, Propper donning, Proper wear schedule/adjustment, and positioning in sitting to support weight of prosthesis. Prosthetic wear tolerance: 2 hours/day, 7 days/week Person educated: Patient and Spouse Education  method: Explanation, Demonstration, Tactile cues, and Verbal cues Education comprehension: verbalized understanding and needs further education  ASSESSMENT:  CLINICAL IMPRESSION: Patient is a 61 y.o. male who was seen today for physical therapy evaluation and treatment for right BKA & left Transmetatarsal Amputation.  He has been significantly limited in mobility for last 4 months along with starting dialysis with resulting deconditioning. He received his prosthesis 3 days prior to PT evaluation and is dependent in use & care. He is limited in wear of prosthesis which limits mobility during his day.  His balance is significantly impaired requiring BUE support and assist for sit to/from stand.  His gait is limited with need for assistance and has significant deviations.  He would benefit from skilled PT to improve his function & safety.   OBJECTIVE IMPAIRMENTS Abnormal gait, cardiopulmonary status limiting activity, decreased activity tolerance, decreased balance, decreased endurance, decreased knowledge of condition, decreased knowledge of use of DME, decreased mobility, difficulty walking, decreased ROM, decreased strength,  increased edema, impaired flexibility, postural dysfunction, and prosthetic dependency .   ACTIVITY LIMITATIONS carrying, lifting, bending, sitting, standing, squatting, stairs, transfers, locomotion level, and prosthesis use  PARTICIPATION LIMITATIONS: driving, community activity, occupation, yard work, and recreational of camping  [place], Past/current experiences, Time since onset of injury/illness/exacerbation, and 3+ comorbidities: see PMH  are also affecting patient's functional outcome.   REHAB POTENTIAL: Good  CLINICAL DECISION MAKING: Evolving/moderate complexity  EVALUATION COMPLEXITY: Moderate   GOALS: Goals reviewed with patient? Yes  SHORT TERM GOALS: Target date: 07/22/2022  Patient donnes prosthesis modified independent & verbalizes proper cleaning. Baseline: SEE OBJECTIVE DATA Goal status: INITIAL 2.  Patient tolerates prosthesis >10 hrs total /day without increase in skin issues & no limb pain. Baseline: SEE OBJECTIVE DATA Goal status: INITIAL  3.  Patient able to reach 5" and look over both shoulders with RW support with supervision. Baseline: SEE OBJECTIVE DATA Goal status: INITIAL  4. Patient ambulates 2' with RW & prosthesis with minA. Baseline: SEE OBJECTIVE DATA Goal status: INITIAL    LONG TERM GOALS: Target date: 08/27/2022  Patient demonstrates & verbalized understanding of prosthetic care to enable safe utilization of prosthesis. Baseline: SEE OBJECTIVE DATA Goal status: INITIAL  Patient tolerates prosthesis wear >90% of awake hours without skin or limb pain issues. Baseline: SEE OBJECTIVE DATA Goal status: INITIAL  Berg Balance >/= 36/56 to indicate lower fall risk Baseline: SEE OBJECTIVE DATA Goal status: INITIAL  Patient ambulates >300' with prosthesis & LRAD independently Baseline: SEE OBJECTIVE DATA Goal status: INITIAL  Patient negotiates ramps, curbs & stairs with single rail with prosthesis & LRAD  independently. Baseline: SEE OBJECTIVE DATA Goal status: INITIAL   PLAN: PT FREQUENCY: 2x/week  PT DURATION: 10 weeks  PLANNED INTERVENTIONS: Therapeutic exercises, Therapeutic activity, Neuromuscular re-education, Balance training, Gait training, Patient/Family education, Self Care, Stair training, Vestibular training, Canalith repositioning, Prosthetic training, DME instructions, Vasopneumatic device, and physical performance testing  PLAN FOR NEXT SESSION: review & update prosthetic care, instruct in HEP at sink   Jamey Reas, PT, DPT 06/22/2022, 12:54 PM

## 2022-06-23 DIAGNOSIS — N2581 Secondary hyperparathyroidism of renal origin: Secondary | ICD-10-CM | POA: Diagnosis not present

## 2022-06-23 DIAGNOSIS — N186 End stage renal disease: Secondary | ICD-10-CM | POA: Diagnosis not present

## 2022-06-23 DIAGNOSIS — Z992 Dependence on renal dialysis: Secondary | ICD-10-CM | POA: Diagnosis not present

## 2022-06-24 ENCOUNTER — Ambulatory Visit (INDEPENDENT_AMBULATORY_CARE_PROVIDER_SITE_OTHER): Payer: BC Managed Care – PPO | Admitting: Physical Therapy

## 2022-06-24 ENCOUNTER — Encounter: Payer: Self-pay | Admitting: Physical Therapy

## 2022-06-24 DIAGNOSIS — R2689 Other abnormalities of gait and mobility: Secondary | ICD-10-CM | POA: Diagnosis not present

## 2022-06-24 DIAGNOSIS — R2681 Unsteadiness on feet: Secondary | ICD-10-CM

## 2022-06-24 DIAGNOSIS — R609 Edema, unspecified: Secondary | ICD-10-CM

## 2022-06-24 DIAGNOSIS — M6281 Muscle weakness (generalized): Secondary | ICD-10-CM | POA: Diagnosis not present

## 2022-06-24 DIAGNOSIS — M21372 Foot drop, left foot: Secondary | ICD-10-CM

## 2022-06-24 DIAGNOSIS — L98498 Non-pressure chronic ulcer of skin of other sites with other specified severity: Secondary | ICD-10-CM

## 2022-06-24 NOTE — Patient Instructions (Signed)

## 2022-06-24 NOTE — Therapy (Signed)
OUTPATIENT PHYSICAL THERAPY TREATMENT NOTE   Patient Name: Jeffrey Ayala MRN: [id number] DOB:12/25/1960, 61 y.o., male Today's Date: 06/24/2022  PCP: [name], NP-C REFERRING PROVIDER: [name], MD  END OF SESSION:   PT End of Session - 06/24/22 1401     Visit Number 2    Number of Visits 20    Authorization Type [employer] comm PPO    Authorization Time Period 20 visit limit PT/OT/chiro, $35 co-pay    Authorization - Visit Number 2    Authorization - Number of Visits 20    Progress Note Due on Visit 10    PT Start Time 1059    PT Stop Time 1143    PT Time Calculation (min) 44 min    Equipment Utilized During Treatment Gait belt    Activity Tolerance Patient tolerated treatment well;Patient limited by fatigue    Behavior During Therapy WFL for tasks assessed/performed;Flat affect             Past Medical History:  Diagnosis Date   Concussion    Eosinophilic esophagitis    EGD/bx by Dr. Havery Moros   Gastric ulcer    History of kidney stones    Intermittent dysphagia    Migraine    Past Surgical History:  Procedure Laterality Date   AMPUTATION Right 03/15/2022   Procedure: RIGHT BELOW KNEE AMPUTATION;  Surgeon: [name], MD;  Location: [place];  Service: Orthopedics;  Laterality: Right;   CYSTOSCOPY     DENTAL SURGERY     FINGER SURGERY     HERNIA REPAIR     at age 14   IR FLUORO GUIDE CV LINE RIGHT  03/09/2022   IR PATIENT EVAL TECH 0-60 MINS  03/09/2022   IR US GUIDE VASC ACCESS RIGHT  03/09/2022   KNEE ARTHROSCOPY Left    LITHOTRIPSY     NEPHROLITHOTOMY     SCROTAL EXPLORATION N/A 03/16/2022   Procedure: IRRIGATION AND DEBRIDEMENT SCROTUM;  Surgeon: Raynelle Bring, MD;  Location: [place];  Service: Urology;  Laterality: N/A;   SCROTAL EXPLORATION N/A 03/28/2022   Procedure: IRRIGATION AND DEBRIDEMENT SCROTUM;  Surgeon: Ardis Hughs, MD;  Location: [place];  Service: Urology;  Laterality: N/A;   TONSILLECTOMY     TOTAL KNEE ARTHROPLASTY  Bilateral 11/04/2018   Procedure: BILATERAL TOTAL KNEE ARTHROPLASTY;  Surgeon: [name], MD;  Location: [place];  Service: Orthopedics;  Laterality: Bilateral;  spinal/epidural per anesthesiologist   TRANSMETATARSAL AMPUTATION Left 03/15/2022   Procedure: LEFT TRANSMETATARSAL AMPUTATION;  Surgeon: [name], MD;  Location: [place];  Service: Orthopedics;  Laterality: Left;   Patient Active Problem List   Diagnosis Date Noted   Suicidal ideations 06/09/2022   Chills (without fever) 06/09/2022   Severe depression ([place]) 06/09/2022   Acute cough 06/09/2022   Reactive depression 05/31/2022   Belching 05/27/2022   Gastroesophageal reflux disease 05/27/2022   Atrial fibrillation ([place]) 05/20/2022   Long term (current) use of anticoagulants 05/20/2022   S/P transmetatarsal amputation of foot, left ([place]) 04/18/2022   CKD (chronic kidney disease) stage 5, GFR less than 15 ml/min ([place]) 04/18/2022   Secondary hyperparathyroidism of renal origin ([place]) 04/10/2022   Pruritus, unspecified 04/10/2022   Nausea 04/10/2022   Iron deficiency anemia, unspecified 04/10/2022   Hypothyroidism, unspecified 04/10/2022   Other acute kidney failure ([place]) 04/10/2022   Dyspnea, unspecified 55/73/2202   Eosinophilic esophagitis 54/27/0623   Gastric ulcer, unspecified as acute or chronic, without hemorrhage or perforation  04/10/2022   Coagulation defect, unspecified ([place]) 04/10/2022   Allergy, unspecified, initial encounter 04/10/2022   Anaphylactic shock, unspecified, initial encounter 04/10/2022   Arthralgia of right lower leg    Acute blood loss anemia    Unilateral complete BKA, right, initial encounter ([place]) 04/03/2022   S/P BKA (below knee amputation), right ([place]) 04/03/2022   Old myocardial infarction 04/02/2022   Dependence on renal dialysis ([place]) 04/02/2022   Acquired absence of right leg below knee ([place]) 04/02/2022   Cirrhosis ([place]) 03/31/2022   Fournier's gangrene in male    Scrotal infection  03/25/2022   Normocytic anemia 03/25/2022   Diarrhea 03/25/2022   Shock liver 03/25/2022   Acute respiratory failure with hypoxia ([place]) 03/25/2022   Gout 03/25/2022   Acute nonintractable headache 03/25/2022   Demand ischemia ([place]) 03/25/2022   Thrombocytopenia ([place]) 03/25/2022   Obesity, Class III, BMI 40-49.9 (morbid obesity) ([place]) 03/25/2022   Cardiac arrest ([place]) 03/25/2022   Hypotension    Respiratory insufficiency    Cellulitis    Malnutrition of moderate degree 03/12/2022   Altered mental status    Gangrene of right foot ([place])    Gangrene of left foot ([place])    Severe protein-calorie malnutrition ([place]) 03/10/2022   Encounter for central line placement    Pressure injury of skin 02/23/2022   Septic shock ([place]) 02/22/2022   AKI (acute kidney injury) ([place])    Generalized abdominal pain    Lactic acidosis    Atrial fibrillation with RVR ([name])    Hypokalemia    Hypomagnesemia    Pain in finger of right hand 08/14/2021   Degenerative joint disease of shoulder region 05/07/2020   Olecranon bursitis of right elbow 05/07/2020   Pain in joint of right shoulder 04/26/2020   S/P TKR (total knee replacement), bilateral 11/04/2018   Bilateral primary osteoarthritis of knee    DDD (degenerative disc disease), cervical 01/10/2018   Rupture of medial head of gastrocnemius, left, sequela 01/14/2017   Kidney stones 06/22/2016   Essential hypertension 08/01/2010   DYSTHYMIC DISORDER 07/08/2010   PALPITATIONS 07/08/2010    REFERRING DIAG: Z89.511 (ICD-10-CM) - Hx of below knee amputation, right   ONSET DATE: 06/19/2022 Prosthesis delivery  THERAPY DIAG:  Other abnormalities of gait and mobility  Muscle weakness (generalized)  Unsteadiness on feet  Non-pressure chronic ulcer of skin of other sites with other specified severity ([name])  Edema, unspecified type  Foot drop, left  Rationale for Evaluation and Treatment Rehabilitation  PERTINENT HISTORY: Rt BKA 03/15/22, Left MTA  03/15/2022, bil. TKA 11/04/18, ESRD / chronic kidney disease st5 dialysis Tues/Thurs/Sat, cirrhosis, cardiac arrest May 23 post intubation, A-Fib  PRECAUTIONS: Fall  SUBJECTIVE: He wore prosthesis yesterday 2 hr before dialysis but only 1 hr after.  He reports not sleeping after dialysis but working from home.  He does have less energy after dialysis.  PAIN:  Are you having pain? No   OBJECTIVE: (objective measures completed at initial evaluation unless otherwise dated)  OBJECTIVE:    COGNITION: 06/22/2022  Overall cognitive status: Within functional limits for tasks assessed and pt had flat affect so unable to fully assess cognition.   POSTURE: 06/22/2022  rounded shoulders, forward head, flexed trunk , and weight shift left   LOWER EXTREMITY ROM:   ROM P:passive  A:active Right Eval 06/22/2022 Left Eval 06/22/2022  Hip flexion      Hip extension Standing A: ~30* limitation Standing A: ~30* limitation  Hip abduction      Hip adduction  Hip internal rotation      Hip external rotation      Knee flexion      Knee extension Standing A: ~15* limitation Standing A: ~15* limitation  Ankle dorsiflexion   Standing A: ~25* limitation  Ankle plantarflexion      Ankle inversion      Ankle eversion       (Blank rows = not tested)   LOWER EXTREMITY MMT:   MMT Right Eval 06/22/2022 Left Eval 06/22/2022  Hip flexion 4/5 4/5  Hip extension 2/5 functional 2/5 functional  Hip abduction 2/5 functional 2/5 functional  Hip adduction      Hip internal rotation      Hip external rotation      Knee flexion      Knee extension 3-/5 functional 3-/5 functional  Ankle dorsiflexion 2-/5    Ankle plantarflexion      Ankle inversion      Ankle eversion      (Blank rows = not tested)   TRANSFERS: 06/22/2022  Sit to stand: Mod A and from 22" w/c requiring armrests to push & RW to stabilize 06/22/2022  Stand to sit: Min A from RW to 22" w/c unable to release RW to reach for w/c until  seated.   GAIT: 06/22/2022 Gait pattern: step to pattern, decreased step length- Left, decreased stance time- Right, decreased stride length, decreased hip/knee flexion- Right, circumduction- Right, Right hip hike, knee flexed in stance- Right, knee flexed in stance- Left, antalgic, trunk flexed, abducted- Right, and poor foot clearance- Left excessive weightbearing BUEs on RW Distance walked: 5' Assistive device utilized: Environmental consultant - 2 wheeled and right TTA prosthesis / left plate & shoe filler Level of assistance: Mod A Comments: post gait HR 82, SpO2 99% with dyspnea noted   FUNCTIONAL TESTs:  06/22/2022  Static standing balance with BUE support on RW for 1 min with minA.     CURRENT PROSTHETIC WEAR ASSESSMENT: 06/22/2022 Patient is dependent with: skin check, residual limb care, care of non-amputated limb, prosthetic cleaning, ply sock cleaning, correct ply sock adjustment, proper wear schedule/adjustment, and proper weight-bearing schedule/adjustment Donning prosthesis: Total A and max cues required, Doffing prosthesis: Total A and wife able to doffe Prosthetic wear tolerance: 1-2 hours 1-2x/day, for 3 of 3 days since delivery.  Prosthetic weight bearing tolerance: 0 minutes unable to stand outside of PT Edema: pitting with >5sec capillary refill Residual limb condition: 79m wound with serous drainage & black scab present,  calf measurement is 42.2cm Prosthetic description:  silicon liner with pin suspension, flexible inner socket Total Contact design, dynamic response foot with hydraulic PF/DF K code/activity level with prosthetic use: Level 3     TODAY'S TREATMENT:  06/24/2022 Prosthetic Care with right TTA prosthesis & left TMA shoe filler with carbon plate: PT reviewed LTG of wear most of awake hours and rationale for function.  PT currently recommending wear 2hrs 2x/day including sitting for work.  Pt did report transfers are better with prosthesis. PT instructed in weighing  prosthesis for dialysis and subtracting weight from weigh-in & weigh-out at dialysis. Pt & wife verbalized understanding.   Neuromuscular Re-ed  Working on standing balance, standing endurance and proprioception using residual limb / socket interface -  Pt performed 3-5 reps of ea exercise for comprehension. Pt & wife verbalize understanding.   Do each exercise 1-2  times per day Do each exercise 5-10 repetitions Hold each exercise for 2 seconds to feel your location  [place]  YOUR MIDLINE POSITION AND PLACE FEET EQUAL DISTANCE FROM THE MIDLINE.  Try to find this position when standing still for activities.   USE TAPE ON FLOOR TO MARK THE MIDLINE POSITION which is even with middle of sink.  You also should try to feel with your limb pressure in socket.  You are trying to feel with limb what you used to feel with the bottom of your foot.  Side to Side Shift: Moving your hips only (not shoulders): move weight onto your left leg, HOLD/FEEL pressure in socket.  Move back to equal weight on each leg, HOLD/FEEL pressure in socket. Move weight onto your right leg, HOLD/FEEL pressure in socket. Move back to equal weight on each leg, HOLD/FEEL pressure in socket. Repeat.  Start with both hands on sink, progress to hand on prosthetic side only, then no hands.  Front to Back Shift: Moving your hips only (not shoulders): move your weight forward onto your toes, HOLD/FEEL pressure in socket. Move your weight back to equal Flat Foot on both legs, HOLD/FEEL  pressure in socket. Move your weight back onto your heels, HOLD/FEEL  pressure in socket. Move your weight back to equal on both legs, HOLD/FEEL  pressure in socket. Repeat.  Start with both hands on sink, progress to hand on prosthetic side only, then no hands.  Moving Cones / Cups: With equal weight on each leg: Hold on with one hand the first time, then progress to no hand supports. Move cups from one side of sink to the other. Place cups ~2" out of  your reach, progress to 10" beyond reach.  Place one hand in middle of sink and reach with other hand. Do both arms.  Then hover one hand and move cups with other hand.  Overhead/Upward Reaching: alternated reaching up to top cabinets or ceiling if no cabinets present. Keep equal weight on each leg. Start with one hand support on counter while other hand reaches and progress to no hand support with reaching.  ace one hand in middle of sink and reach with other hand. Do both arms.  Then hover one hand and move cups with other hand.  5.   Looking Over Shoulders: With equal weight on each leg: alternate turning to look over your shoulders with one hand support on counter as needed.  Start with head motions only to look in front of shoulder, then even with shoulder and progress to looking behind you. To look to side, move head /eyes, then shoulder on side looking pulls back, shift more weight to side looking and pull hip back. Place one hand in middle of sink and let go with other hand so your shoulder can pull back. Switch hands to look other way.   Then hover one hand and look over shoulder. If looking right, use left hand at sink. If looking left, use right hand at sink. 6.  Stepping with leg that is not amputated:  Move items under cabinet out of your way. Shift your hips/pelvis so weight on prosthesis. Tighten muscles in hip on prosthetic side.  SLOWLY step other leg so front of foot is in cabinet. Then step back to floor.   Pt also recommending sitting at edge of w/c with feet supported on floor but no back support each hour in w/c until back fatigues. PT also demo & instructed in sitting on 24" stool. Pt & wife verbalized understanding.    06/22/2022 PATIENT EDUCATION: PATIENT EDUCATED ON FOLLOWING PROSTHETIC CARE:  Skin check,  Residual limb care, Prosthetic cleaning, Correct ply sock adjustment, [place] donning, Proper wear schedule/adjustment, and positioning in sitting to support weight of prosthesis.  Prosthetic wear tolerance: 2 hours/day, 7 days/week Person educated: Patient and Spouse Education method: Explanation, Demonstration, Tactile cues, and Verbal cues Education comprehension: verbalized understanding and needs further education   ASSESSMENT:   CLINICAL IMPRESSION: Patient and wife appear to understand HEP at sink including rationale.  He reports that he stood longer than he has since amputation. He was fatigued but pleased with that ability.     OBJECTIVE IMPAIRMENTS Abnormal gait, cardiopulmonary status limiting activity, decreased activity tolerance, decreased balance, decreased endurance, decreased knowledge of condition, decreased knowledge of use of DME, decreased mobility, difficulty walking, decreased ROM, decreased strength, increased edema, impaired flexibility, postural dysfunction, and prosthetic dependency .    ACTIVITY LIMITATIONS carrying, lifting, bending, sitting, standing, squatting, stairs, transfers, locomotion level, and prosthesis use   PARTICIPATION LIMITATIONS: driving, community activity, occupation, yard work, and recreational of camping   [place], Past/current experiences, Time since onset of injury/illness/exacerbation, and 3+ comorbidities: see PMH  are also affecting patient's functional outcome.    REHAB POTENTIAL: Good   CLINICAL DECISION MAKING: Evolving/moderate complexity   EVALUATION COMPLEXITY: Moderate     GOALS: Goals reviewed with patient? Yes   SHORT TERM GOALS: Target date: 07/22/2022   Patient donnes prosthesis modified independent & verbalizes proper cleaning. Baseline: SEE OBJECTIVE DATA Goal status: INITIAL 2.  Patient tolerates prosthesis >10 hrs total /day without increase in skin issues & no limb pain. Baseline: SEE OBJECTIVE DATA Goal status: INITIAL   3.  Patient able to reach 5" and look over both shoulders with RW support with supervision. Baseline: SEE OBJECTIVE DATA Goal status: INITIAL   4.  Patient ambulates 29' with RW & prosthesis with minA. Baseline: SEE OBJECTIVE DATA Goal status: INITIAL       LONG TERM GOALS: Target date: 08/27/2022   Patient demonstrates & verbalized understanding of prosthetic care to enable safe utilization of prosthesis. Baseline: SEE OBJECTIVE DATA Goal status: INITIAL   Patient tolerates prosthesis wear >90% of awake hours without skin or limb pain issues. Baseline: SEE OBJECTIVE DATA Goal status: INITIAL   Berg Balance >/= 36/56 to indicate lower fall risk Baseline: SEE OBJECTIVE DATA Goal status: INITIAL   Patient ambulates >300' with prosthesis & LRAD independently Baseline: SEE OBJECTIVE DATA Goal status: INITIAL   Patient negotiates ramps, curbs & stairs with single rail with prosthesis & LRAD independently. Baseline: SEE OBJECTIVE DATA Goal status: INITIAL     PLAN: PT FREQUENCY: 2x/week   PT DURATION: 10 weeks   PLANNED INTERVENTIONS: Therapeutic exercises, Therapeutic activity, Neuromuscular re-education, Balance training, Gait training, Patient/Family education, Self Care, Stair training, Vestibular training, [place] repositioning, Prosthetic training, DME instructions, Vasopneumatic device, and physical performance testing   PLAN FOR NEXT SESSION:  verbally check HEP & address issues,  work on transfers squat pivot, sit to/from stand and stand-pivot with RW,  gait with RW.   Jamey Reas, PT, DPT 06/24/2022, 2:02 PM

## 2022-06-25 DIAGNOSIS — N186 End stage renal disease: Secondary | ICD-10-CM | POA: Diagnosis not present

## 2022-06-25 DIAGNOSIS — N2581 Secondary hyperparathyroidism of renal origin: Secondary | ICD-10-CM | POA: Diagnosis not present

## 2022-06-25 DIAGNOSIS — Z992 Dependence on renal dialysis: Secondary | ICD-10-CM | POA: Diagnosis not present

## 2022-06-27 DIAGNOSIS — N2581 Secondary hyperparathyroidism of renal origin: Secondary | ICD-10-CM | POA: Diagnosis not present

## 2022-06-27 DIAGNOSIS — Z992 Dependence on renal dialysis: Secondary | ICD-10-CM | POA: Diagnosis not present

## 2022-06-27 DIAGNOSIS — N186 End stage renal disease: Secondary | ICD-10-CM | POA: Diagnosis not present

## 2022-06-29 ENCOUNTER — Ambulatory Visit (INDEPENDENT_AMBULATORY_CARE_PROVIDER_SITE_OTHER): Payer: BC Managed Care – PPO | Admitting: Physical Therapy

## 2022-06-29 ENCOUNTER — Encounter: Payer: Self-pay | Admitting: Physical Therapy

## 2022-06-29 DIAGNOSIS — R2689 Other abnormalities of gait and mobility: Secondary | ICD-10-CM | POA: Diagnosis not present

## 2022-06-29 DIAGNOSIS — L98498 Non-pressure chronic ulcer of skin of other sites with other specified severity: Secondary | ICD-10-CM | POA: Diagnosis not present

## 2022-06-29 DIAGNOSIS — R2681 Unsteadiness on feet: Secondary | ICD-10-CM

## 2022-06-29 DIAGNOSIS — M6281 Muscle weakness (generalized): Secondary | ICD-10-CM

## 2022-06-29 DIAGNOSIS — M21372 Foot drop, left foot: Secondary | ICD-10-CM

## 2022-06-29 DIAGNOSIS — R609 Edema, unspecified: Secondary | ICD-10-CM

## 2022-06-29 NOTE — Therapy (Signed)
OUTPATIENT PHYSICAL THERAPY TREATMENT NOTE   Patient Name: Jeffrey Ayala MRN: [id number] DOB:[birth year], 61 y.o., male Today's Date: 06/29/2022  PCP: [name], NP-C REFERRING PROVIDER: [name], MD  END OF SESSION:   PT End of Session - 06/29/22 0846     Visit Number 3    Number of Visits 20    Authorization Type [employer] comm PPO    Authorization Time Period 20 visit limit PT/OT/chiro, $35 co-pay    Authorization - Visit Number 2    Authorization - Number of Visits 20    Progress Note Due on Visit 10    PT Start Time 0845    PT Stop Time 0927    PT Time Calculation (min) 42 min    Equipment Utilized During Treatment Gait belt    Activity Tolerance Patient tolerated treatment well;Patient limited by fatigue    Behavior During Therapy WFL for tasks assessed/performed;Flat affect              Past Medical History:  Diagnosis Date   Concussion    Eosinophilic esophagitis    EGD/bx by Dr. Havery Moros   Gastric ulcer    History of kidney stones    Intermittent dysphagia    Migraine    Past Surgical History:  Procedure Laterality Date   AMPUTATION Right 03/15/2022   Procedure: RIGHT BELOW KNEE AMPUTATION;  Surgeon: [name], MD;  Location: [facility];  Service: Orthopedics;  Laterality: Right;   CYSTOSCOPY     DENTAL SURGERY     FINGER SURGERY     HERNIA REPAIR     at age 67   IR FLUORO GUIDE CV LINE RIGHT  03/09/2022   IR PATIENT EVAL TECH 0-60 MINS  03/09/2022   IR US GUIDE VASC ACCESS RIGHT  03/09/2022   KNEE ARTHROSCOPY Left    LITHOTRIPSY     NEPHROLITHOTOMY     SCROTAL EXPLORATION N/A 03/16/2022   Procedure: IRRIGATION AND DEBRIDEMENT SCROTUM;  Surgeon: Raynelle Bring, MD;  Location: [facility];  Service: Urology;  Laterality: N/A;   SCROTAL EXPLORATION N/A 03/28/2022   Procedure: IRRIGATION AND DEBRIDEMENT SCROTUM;  Surgeon: Ardis Hughs, MD;  Location: [facility];  Service: Urology;  Laterality: N/A;   TONSILLECTOMY     TOTAL KNEE ARTHROPLASTY  Bilateral 11/04/2018   Procedure: BILATERAL TOTAL KNEE ARTHROPLASTY;  Surgeon: [name], MD;  Location: [facility];  Service: Orthopedics;  Laterality: Bilateral;  spinal/epidural per anesthesiologist   TRANSMETATARSAL AMPUTATION Left 03/15/2022   Procedure: LEFT TRANSMETATARSAL AMPUTATION;  Surgeon: [name], MD;  Location: [facility];  Service: Orthopedics;  Laterality: Left;   Patient Active Problem List   Diagnosis Date Noted   Suicidal ideations 06/09/2022   Chills (without fever) 06/09/2022   Severe depression ([place]) 06/09/2022   Acute cough 06/09/2022   Reactive depression 05/31/2022   Belching 05/27/2022   Gastroesophageal reflux disease 05/27/2022   Atrial fibrillation ([place]) 05/20/2022   Long term (current) use of anticoagulants 05/20/2022   S/P transmetatarsal amputation of foot, left ([place]) 04/18/2022   CKD (chronic kidney disease) stage 5, GFR less than 15 ml/min ([place]) 04/18/2022   Secondary hyperparathyroidism of renal origin ([place]) 04/10/2022   Pruritus, unspecified 04/10/2022   Nausea 04/10/2022   Iron deficiency anemia, unspecified 04/10/2022   Hypothyroidism, unspecified 04/10/2022   Other acute kidney failure ([place]) 04/10/2022   Dyspnea, unspecified 32/10/2481   Eosinophilic esophagitis 50/01/7047   Gastric ulcer, unspecified as acute or chronic, without hemorrhage or  perforation 04/10/2022   Coagulation defect, unspecified ([place]) 04/10/2022   Allergy, unspecified, initial encounter 04/10/2022   Anaphylactic shock, unspecified, initial encounter 04/10/2022   Arthralgia of right lower leg    Acute blood loss anemia    Unilateral complete BKA, right, initial encounter ([place]) 04/03/2022   S/P BKA (below knee amputation), right ([place]) 04/03/2022   Old myocardial infarction 04/02/2022   Dependence on renal dialysis ([place]) 04/02/2022   Acquired absence of right leg below knee ([place]) 04/02/2022   Cirrhosis ([place]) 03/31/2022   Fournier's gangrene in male    Scrotal infection  03/25/2022   Normocytic anemia 03/25/2022   Diarrhea 03/25/2022   Shock liver 03/25/2022   Acute respiratory failure with hypoxia ([place]) 03/25/2022   Gout 03/25/2022   Acute nonintractable headache 03/25/2022   Demand ischemia ([place]) 03/25/2022   Thrombocytopenia ([place]) 03/25/2022   Obesity, Class III, BMI 40-49.9 (morbid obesity) ([place]) 03/25/2022   Cardiac arrest ([place]) 03/25/2022   Hypotension    Respiratory insufficiency    Cellulitis    Malnutrition of moderate degree 03/12/2022   Altered mental status    Gangrene of right foot ([place])    Gangrene of left foot ([place])    Severe protein-calorie malnutrition ([place]) 03/10/2022   Encounter for central line placement    Pressure injury of skin 02/23/2022   Septic shock ([place]) 02/22/2022   AKI (acute kidney injury) ([place])    Generalized abdominal pain    Lactic acidosis    Atrial fibrillation with RVR ([name])    Hypokalemia    Hypomagnesemia    Pain in finger of right hand 08/14/2021   Degenerative joint disease of shoulder region 05/07/2020   Olecranon bursitis of right elbow 05/07/2020   Pain in joint of right shoulder 04/26/2020   S/P TKR (total knee replacement), bilateral 11/04/2018   Bilateral primary osteoarthritis of knee    DDD (degenerative disc disease), cervical 01/10/2018   Rupture of medial head of gastrocnemius, left, sequela 01/14/2017   Kidney stones 06/22/2016   Essential hypertension 08/01/2010   DYSTHYMIC DISORDER 07/08/2010   PALPITATIONS 07/08/2010    REFERRING DIAG: Z89.511 (ICD-10-CM) - Hx of below knee amputation, right   ONSET DATE: 06/19/2022 Prosthesis delivery  THERAPY DIAG:  Other abnormalities of gait and mobility  Muscle weakness (generalized)  Unsteadiness on feet  Non-pressure chronic ulcer of skin of other sites with other specified severity ([name])  Edema, unspecified type  Foot drop, left  Rationale for Evaluation and Treatment Rehabilitation  PERTINENT HISTORY: Rt BKA 03/15/22, Left MTA  03/15/2022, bil. TKA 11/04/18, ESRD / chronic kidney disease st5 dialysis Tues/Thurs/Sat, cirrhosis, cardiac arrest May 23 post intubation, A-Fib  PRECAUTIONS: Fall  SUBJECTIVE: He has been wearing prosthesis 3-4 hrs 2x/day.  He has been doing the exercises.    PAIN:  Are you having pain? No   OBJECTIVE: (objective measures completed at initial evaluation unless otherwise dated)  OBJECTIVE:    COGNITION: 06/22/2022  Overall cognitive status: Within functional limits for tasks assessed and pt had flat affect so unable to fully assess cognition.   POSTURE: 06/22/2022  rounded shoulders, forward head, flexed trunk , and weight shift left   LOWER EXTREMITY ROM:   ROM P:passive  A:active Right Eval 06/22/2022 Left Eval 06/22/2022  Hip flexion      Hip extension Standing A: ~30* limitation Standing A: ~30* limitation  Hip abduction      Hip adduction      Hip internal rotation      Hip  external rotation      Knee flexion      Knee extension Standing A: ~15* limitation Standing A: ~15* limitation  Ankle dorsiflexion   Standing A: ~25* limitation  Ankle plantarflexion      Ankle inversion      Ankle eversion       (Blank rows = not tested)   LOWER EXTREMITY MMT:   MMT Right Eval 06/22/2022 Left Eval 06/22/2022  Hip flexion 4/5 4/5  Hip extension 2/5 functional 2/5 functional  Hip abduction 2/5 functional 2/5 functional  Hip adduction      Hip internal rotation      Hip external rotation      Knee flexion      Knee extension 3-/5 functional 3-/5 functional  Ankle dorsiflexion 2-/5    Ankle plantarflexion      Ankle inversion      Ankle eversion      (Blank rows = not tested)   TRANSFERS: 06/22/2022  Sit to stand: Mod A and from 22" w/c requiring armrests to push & RW to stabilize 06/22/2022  Stand to sit: Min A from RW to 22" w/c unable to release RW to reach for w/c until seated.   GAIT: 06/22/2022 Gait pattern: step to pattern, decreased step length- Left,  decreased stance time- Right, decreased stride length, decreased hip/knee flexion- Right, circumduction- Right, Right hip hike, knee flexed in stance- Right, knee flexed in stance- Left, antalgic, trunk flexed, abducted- Right, and poor foot clearance- Left excessive weightbearing BUEs on RW Distance walked: 5' Assistive device utilized: Environmental consultant - 2 wheeled and right TTA prosthesis / left plate & shoe filler Level of assistance: Mod A Comments: post gait HR 82, SpO2 99% with dyspnea noted   FUNCTIONAL TESTs:  06/22/2022  Static standing balance with BUE support on RW for 1 min with minA.     CURRENT PROSTHETIC WEAR ASSESSMENT: 06/22/2022 Patient is dependent with: skin check, residual limb care, care of non-amputated limb, prosthetic cleaning, ply sock cleaning, correct ply sock adjustment, proper wear schedule/adjustment, and proper weight-bearing schedule/adjustment Donning prosthesis: Total A and max cues required, Doffing prosthesis: Total A and wife able to doffe Prosthetic wear tolerance: 1-2 hours 1-2x/day, for 3 of 3 days since delivery.  Prosthetic weight bearing tolerance: 0 minutes unable to stand outside of PT Edema: pitting with >5sec capillary refill Residual limb condition: 1m wound with serous drainage & black scab present,  calf measurement is 42.2cm Prosthetic description:  silicon liner with pin suspension, flexible inner socket Total Contact design, dynamic response foot with hydraulic PF/DF K code/activity level with prosthetic use: Level 3     TODAY'S TREATMENT:  06/29/2022 Prosthetic Care with right TTA prosthesis & left TMA shoe filler with carbon plate: Prosthesis & liner weigh 2.3kg  PT instructed in subtracting from dialysis weigh-in & weigh-out. PT anticipating wear to increase by next week to use prosthesis for transfers with dialysis. Pt & wife verbalized understanding.  PT recommended wear 4-5 hours 2x/day now.  PT instructed in sweating & need to dry limb /  liner if noted then immediately redonning to complete wear time.  PT verbally reviewed adjusting ply socks. Pt verbalized understanding.  PT demo & verbal cues on sit to/from stand technique.  He improved from minA to contact assist only during session from 21" w/c & 18" chair with armrests. Pt amb 43' (max tolerable) with RW with min guard. Verbal & tactile cues on upright posture, RW location and wt shift over  prosthesis in stance.  PT demo & verbal cues on stand pivot transfers using RW. Pt performed with contact assist. PT instructed pt & wife in how to safely provide assist for gait.  PT had wife count steps for 20' = 11 of her steps.  Set w/c 20' from chair with armrest. She provided contact assist 32' X2 for prosthetic gait with RW with PT supervising with any issues.  PT recommending 6-8 times during day with plan to progress distance & frequency over time. Pt & wife verbalized understanding.    06/24/2022 Prosthetic Care with right TTA prosthesis & left TMA shoe filler with carbon plate: PT reviewed LTG of wear most of awake hours and rationale for function.  PT currently recommending wear 2hrs 2x/day including sitting for work.  Pt did report transfers are better with prosthesis. PT instructed in weighing prosthesis for dialysis and subtracting weight from weigh-in & weigh-out at dialysis. Pt & wife verbalized understanding.   Neuromuscular Re-ed  Working on standing balance, standing endurance and proprioception using residual limb / socket interface -  Pt performed 3-5 reps of ea exercise for comprehension. Pt & wife verbalize understanding.   Do each exercise 1-2  times per day Do each exercise 5-10 repetitions Hold each exercise for 2 seconds to feel your location  AT [place].  Try to find this position when standing still for activities.   USE TAPE ON FLOOR TO MARK THE MIDLINE POSITION which is even with middle  of sink.  You also should try to feel with your limb pressure in socket.  You are trying to feel with limb what you used to feel with the bottom of your foot.  Side to Side Shift: Moving your hips only (not shoulders): move weight onto your left leg, HOLD/FEEL pressure in socket.  Move back to equal weight on each leg, HOLD/FEEL pressure in socket. Move weight onto your right leg, HOLD/FEEL pressure in socket. Move back to equal weight on each leg, HOLD/FEEL pressure in socket. Repeat.  Start with both hands on sink, progress to hand on prosthetic side only, then no hands.  Front to Back Shift: Moving your hips only (not shoulders): move your weight forward onto your toes, HOLD/FEEL pressure in socket. Move your weight back to equal Flat Foot on both legs, HOLD/FEEL  pressure in socket. Move your weight back onto your heels, HOLD/FEEL  pressure in socket. Move your weight back to equal on both legs, HOLD/FEEL  pressure in socket. Repeat.  Start with both hands on sink, progress to hand on prosthetic side only, then no hands.  Moving Cones / Cups: With equal weight on each leg: Hold on with one hand the first time, then progress to no hand supports. Move cups from one side of sink to the other. Place cups ~2" out of your reach, progress to 10" beyond reach.  Place one hand in middle of sink and reach with other hand. Do both arms.  Then hover one hand and move cups with other hand.  Overhead/Upward Reaching: alternated reaching up to top cabinets or ceiling if no cabinets present. Keep equal weight on each leg. Start with one hand support on counter while other hand reaches and progress to no hand support with reaching.  ace one hand in middle of sink and reach with other hand. Do both arms.  Then hover one hand and move cups with other hand.  5.  Looking Over Shoulders: With equal weight on each leg: alternate turning to look over your shoulders with one hand support on counter as needed.  Start with head  motions only to look in front of shoulder, then even with shoulder and progress to looking behind you. To look to side, move head /eyes, then shoulder on side looking pulls back, shift more weight to side looking and pull hip back. Place one hand in middle of sink and let go with other hand so your shoulder can pull back. Switch hands to look other way.   Then hover one hand and look over shoulder. If looking right, use left hand at sink. If looking left, use right hand at sink. 6.  Stepping with leg that is not amputated:  Move items under cabinet out of your way. Shift your hips/pelvis so weight on prosthesis. Tighten muscles in hip on prosthetic side.  SLOWLY step other leg so front of foot is in cabinet. Then step back to floor.   Pt also recommending sitting at edge of w/c with feet supported on floor but no back support each hour in w/c until back fatigues. PT also demo & instructed in sitting on 24" stool. Pt & wife verbalized understanding.    06/22/2022 PATIENT EDUCATION: PATIENT EDUCATED ON FOLLOWING PROSTHETIC CARE:  Skin check, Residual limb care, Prosthetic cleaning, Correct ply sock adjustment, [place] donning, Proper wear schedule/adjustment, and positioning in sitting to support weight of prosthesis. Prosthetic wear tolerance: 2 hours/day, 7 days/week Person educated: Patient and Spouse Education method: Explanation, Demonstration, Tactile cues, and Verbal cues Education comprehension: verbalized understanding and needs further education   ASSESSMENT:   CLINICAL IMPRESSION: Patient is tolerating increased wear which he reports is already making transfers easier. PT instructed in transfers & short distance gait with RW which his wife & he appear how to safely perform at home.     OBJECTIVE IMPAIRMENTS Abnormal gait, cardiopulmonary status limiting activity, decreased activity tolerance, decreased balance, decreased endurance, decreased knowledge of condition, decreased knowledge of  use of DME, decreased mobility, difficulty walking, decreased ROM, decreased strength, increased edema, impaired flexibility, postural dysfunction, and prosthetic dependency .    ACTIVITY LIMITATIONS carrying, lifting, bending, sitting, standing, squatting, stairs, transfers, locomotion level, and prosthesis use   PARTICIPATION LIMITATIONS: driving, community activity, occupation, yard work, and recreational of camping   [place], Past/current experiences, Time since onset of injury/illness/exacerbation, and 3+ comorbidities: see PMH  are also affecting patient's functional outcome.    REHAB POTENTIAL: Good   CLINICAL DECISION MAKING: Evolving/moderate complexity   EVALUATION COMPLEXITY: Moderate     GOALS: Goals reviewed with patient? Yes   SHORT TERM GOALS: Target date: 07/22/2022   Patient donnes prosthesis modified independent & verbalizes proper cleaning. Baseline: SEE OBJECTIVE DATA Goal status: INITIAL 2.  Patient tolerates prosthesis >10 hrs total /day without increase in skin issues & no limb pain. Baseline: SEE OBJECTIVE DATA Goal status: INITIAL   3.  Patient able to reach 5" and look over both shoulders with RW support with supervision. Baseline: SEE OBJECTIVE DATA Goal status: INITIAL   4. Patient ambulates 40' with RW & prosthesis with minA. Baseline: SEE OBJECTIVE DATA Goal status: INITIAL       LONG TERM GOALS: Target date: 08/27/2022   Patient demonstrates & verbalized understanding of prosthetic care to enable safe utilization of prosthesis. Baseline: SEE OBJECTIVE DATA Goal status: INITIAL   Patient tolerates prosthesis wear >90% of awake hours without skin or limb pain issues.  Baseline: SEE OBJECTIVE DATA Goal status: INITIAL   Berg Balance >/= 36/56 to indicate lower fall risk Baseline: SEE OBJECTIVE DATA Goal status: INITIAL   Patient ambulates >300' with prosthesis & LRAD independently Baseline: SEE OBJECTIVE DATA Goal status:  INITIAL   Patient negotiates ramps, curbs & stairs with single rail with prosthesis & LRAD independently. Baseline: SEE OBJECTIVE DATA Goal status: INITIAL     PLAN: PT FREQUENCY: 2x/week   PT DURATION: 10 weeks   PLANNED INTERVENTIONS: Therapeutic exercises, Therapeutic activity, Neuromuscular re-education, Balance training, Gait training, Patient/Family education, Self Care, Stair training, Vestibular training, Canalith repositioning, Prosthetic training, DME instructions, Vasopneumatic device, and physical performance testing   PLAN FOR NEXT SESSION:  verbally check how gait with wife is going & address issues,  work on car transfers squat pivot with RW, progress prosthetic care like adjusting ply socks, standing balance.    Jamey Reas, PT, DPT 06/29/2022, 10:05 AM

## 2022-06-30 DIAGNOSIS — Z992 Dependence on renal dialysis: Secondary | ICD-10-CM | POA: Diagnosis not present

## 2022-06-30 DIAGNOSIS — N186 End stage renal disease: Secondary | ICD-10-CM | POA: Diagnosis not present

## 2022-06-30 DIAGNOSIS — N2581 Secondary hyperparathyroidism of renal origin: Secondary | ICD-10-CM | POA: Diagnosis not present

## 2022-07-01 ENCOUNTER — Encounter: Payer: Self-pay | Admitting: Physical Therapy

## 2022-07-01 ENCOUNTER — Ambulatory Visit (INDEPENDENT_AMBULATORY_CARE_PROVIDER_SITE_OTHER): Payer: BC Managed Care – PPO | Admitting: Physical Therapy

## 2022-07-01 DIAGNOSIS — R2689 Other abnormalities of gait and mobility: Secondary | ICD-10-CM

## 2022-07-01 DIAGNOSIS — L98498 Non-pressure chronic ulcer of skin of other sites with other specified severity: Secondary | ICD-10-CM | POA: Diagnosis not present

## 2022-07-01 DIAGNOSIS — R2681 Unsteadiness on feet: Secondary | ICD-10-CM | POA: Diagnosis not present

## 2022-07-01 DIAGNOSIS — M21372 Foot drop, left foot: Secondary | ICD-10-CM

## 2022-07-01 DIAGNOSIS — M6281 Muscle weakness (generalized): Secondary | ICD-10-CM

## 2022-07-01 DIAGNOSIS — R609 Edema, unspecified: Secondary | ICD-10-CM

## 2022-07-01 NOTE — Therapy (Signed)
OUTPATIENT PHYSICAL THERAPY TREATMENT NOTE   Patient Name: Jeffrey Ayala MRN: [id number] DOB:Apr 06, 1961, 61 y.o., male Today's Date: 07/01/2022  PCP: [name], NP-C REFERRING PROVIDER: [name], MD  END OF SESSION:   PT End of Session - 07/01/22 0841     Visit Number 4    Number of Visits 20    Authorization Type [employer] comm PPO    Authorization Time Period 20 visit limit PT/OT/chiro, $35 co-pay    Authorization - Visit Number 4    Authorization - Number of Visits 20    Progress Note Due on Visit 10    PT Start Time 0840    PT Stop Time 0929    PT Time Calculation (min) 49 min    Equipment Utilized During Treatment Gait belt    Activity Tolerance Patient tolerated treatment well;Patient limited by fatigue    Behavior During Therapy WFL for tasks assessed/performed;Flat affect               Past Medical History:  Diagnosis Date   Concussion    Eosinophilic esophagitis    EGD/bx by Dr. Havery Moros   Gastric ulcer    History of kidney stones    Intermittent dysphagia    Migraine    Past Surgical History:  Procedure Laterality Date   AMPUTATION Right 03/15/2022   Procedure: RIGHT BELOW KNEE AMPUTATION;  Surgeon: [name], MD;  Location: [facility];  Service: Orthopedics;  Laterality: Right;   CYSTOSCOPY     DENTAL SURGERY     FINGER SURGERY     HERNIA REPAIR     at age 1   IR FLUORO GUIDE CV LINE RIGHT  03/09/2022   IR PATIENT EVAL TECH 0-60 MINS  03/09/2022   IR US GUIDE VASC ACCESS RIGHT  03/09/2022   KNEE ARTHROSCOPY Left    LITHOTRIPSY     NEPHROLITHOTOMY     SCROTAL EXPLORATION N/A 03/16/2022   Procedure: IRRIGATION AND DEBRIDEMENT SCROTUM;  Surgeon: Raynelle Bring, MD;  Location: [facility];  Service: Urology;  Laterality: N/A;   SCROTAL EXPLORATION N/A 03/28/2022   Procedure: IRRIGATION AND DEBRIDEMENT SCROTUM;  Surgeon: Ardis Hughs, MD;  Location: [facility];  Service: Urology;  Laterality: N/A;   TONSILLECTOMY     TOTAL KNEE ARTHROPLASTY  Bilateral 11/04/2018   Procedure: BILATERAL TOTAL KNEE ARTHROPLASTY;  Surgeon: [name], MD;  Location: [facility];  Service: Orthopedics;  Laterality: Bilateral;  spinal/epidural per anesthesiologist   TRANSMETATARSAL AMPUTATION Left 03/15/2022   Procedure: LEFT TRANSMETATARSAL AMPUTATION;  Surgeon: [name], MD;  Location: [facility];  Service: Orthopedics;  Laterality: Left;   Patient Active Problem List   Diagnosis Date Noted   Suicidal ideations 06/09/2022   Chills (without fever) 06/09/2022   Severe depression ([place]) 06/09/2022   Acute cough 06/09/2022   Reactive depression 05/31/2022   Belching 05/27/2022   Gastroesophageal reflux disease 05/27/2022   Atrial fibrillation ([place]) 05/20/2022   Long term (current) use of anticoagulants 05/20/2022   S/P transmetatarsal amputation of foot, left ([place]) 04/18/2022   CKD (chronic kidney disease) stage 5, GFR less than 15 ml/min ([place]) 04/18/2022   Secondary hyperparathyroidism of renal origin ([place]) 04/10/2022   Pruritus, unspecified 04/10/2022   Nausea 04/10/2022   Iron deficiency anemia, unspecified 04/10/2022   Hypothyroidism, unspecified 04/10/2022   Other acute kidney failure ([place]) 04/10/2022   Dyspnea, unspecified 67/34/1937   Eosinophilic esophagitis 90/24/0973   Gastric ulcer, unspecified as acute or chronic, without hemorrhage  or perforation 04/10/2022   Coagulation defect, unspecified ([place]) 04/10/2022   Allergy, unspecified, initial encounter 04/10/2022   Anaphylactic shock, unspecified, initial encounter 04/10/2022   Arthralgia of right lower leg    Acute blood loss anemia    Unilateral complete BKA, right, initial encounter ([place]) 04/03/2022   S/P BKA (below knee amputation), right ([place]) 04/03/2022   Old myocardial infarction 04/02/2022   Dependence on renal dialysis ([place]) 04/02/2022   Acquired absence of right leg below knee ([place]) 04/02/2022   Cirrhosis ([place]) 03/31/2022   Fournier's gangrene in male    Scrotal infection  03/25/2022   Normocytic anemia 03/25/2022   Diarrhea 03/25/2022   Shock liver 03/25/2022   Acute respiratory failure with hypoxia ([place]) 03/25/2022   Gout 03/25/2022   Acute nonintractable headache 03/25/2022   Demand ischemia ([place]) 03/25/2022   Thrombocytopenia ([place]) 03/25/2022   Obesity, Class III, BMI 40-49.9 (morbid obesity) ([place]) 03/25/2022   Cardiac arrest ([place]) 03/25/2022   Hypotension    Respiratory insufficiency    Cellulitis    Malnutrition of moderate degree 03/12/2022   Altered mental status    Gangrene of right foot ([place])    Gangrene of left foot ([place])    Severe protein-calorie malnutrition ([place]) 03/10/2022   Encounter for central line placement    Pressure injury of skin 02/23/2022   Septic shock ([place]) 02/22/2022   AKI (acute kidney injury) ([place])    Generalized abdominal pain    Lactic acidosis    Atrial fibrillation with RVR ([name])    Hypokalemia    Hypomagnesemia    Pain in finger of right hand 08/14/2021   Degenerative joint disease of shoulder region 05/07/2020   Olecranon bursitis of right elbow 05/07/2020   Pain in joint of right shoulder 04/26/2020   S/P TKR (total knee replacement), bilateral 11/04/2018   Bilateral primary osteoarthritis of knee    DDD (degenerative disc disease), cervical 01/10/2018   Rupture of medial head of gastrocnemius, left, sequela 01/14/2017   Kidney stones 06/22/2016   Essential hypertension 08/01/2010   DYSTHYMIC DISORDER 07/08/2010   PALPITATIONS 07/08/2010    REFERRING DIAG: Z89.511 (ICD-10-CM) - Hx of below knee amputation, right   ONSET DATE: 06/19/2022 Prosthesis delivery  THERAPY DIAG:  Other abnormalities of gait and mobility  Muscle weakness (generalized)  Unsteadiness on feet  Non-pressure chronic ulcer of skin of other sites with other specified severity ([name])  Edema, unspecified type  Foot drop, left  Rationale for Evaluation and Treatment Rehabilitation  PERTINENT HISTORY: Rt BKA 03/15/22, Left MTA  03/15/2022, bil. TKA 11/04/18, ESRD / chronic kidney disease st5 dialysis Tues/Thurs/Sat, cirrhosis, cardiac arrest May 23 post intubation, A-Fib  PRECAUTIONS: Fall  SUBJECTIVE: He has been wearing prosthesis up to 5 hours 2x/day.  He had low BP yesterday at dialysis so he was wiped out.   PAIN:  Are you having pain? No   OBJECTIVE: (objective measures completed at initial evaluation unless otherwise dated)  OBJECTIVE:    COGNITION: 06/22/2022  Overall cognitive status: Within functional limits for tasks assessed and pt had flat affect so unable to fully assess cognition.   POSTURE: 06/22/2022  rounded shoulders, forward head, flexed trunk , and weight shift left   LOWER EXTREMITY ROM:   ROM P:passive  A:active Right Eval 06/22/2022 Left Eval 06/22/2022  Hip flexion      Hip extension Standing A: ~30* limitation Standing A: ~30* limitation  Hip abduction      Hip adduction      Hip  internal rotation      Hip external rotation      Knee flexion      Knee extension Standing A: ~15* limitation Standing A: ~15* limitation  Ankle dorsiflexion   Standing A: ~25* limitation  Ankle plantarflexion      Ankle inversion      Ankle eversion       (Blank rows = not tested)   LOWER EXTREMITY MMT:   MMT Right Eval 06/22/2022 Left Eval 06/22/2022  Hip flexion 4/5 4/5  Hip extension 2/5 functional 2/5 functional  Hip abduction 2/5 functional 2/5 functional  Hip adduction      Hip internal rotation      Hip external rotation      Knee flexion      Knee extension 3-/5 functional 3-/5 functional  Ankle dorsiflexion 2-/5    Ankle plantarflexion      Ankle inversion      Ankle eversion      (Blank rows = not tested)   TRANSFERS: 06/22/2022  Sit to stand: Mod A and from 22" w/c requiring armrests to push & RW to stabilize 06/22/2022  Stand to sit: Min A from RW to 22" w/c unable to release RW to reach for w/c until seated.   GAIT: 06/22/2022 Gait pattern: step to pattern,  decreased step length- Left, decreased stance time- Right, decreased stride length, decreased hip/knee flexion- Right, circumduction- Right, Right hip hike, knee flexed in stance- Right, knee flexed in stance- Left, antalgic, trunk flexed, abducted- Right, and poor foot clearance- Left excessive weightbearing BUEs on RW Distance walked: 5' Assistive device utilized: Environmental consultant - 2 wheeled and right TTA prosthesis / left plate & shoe filler Level of assistance: Mod A Comments: post gait HR 82, SpO2 99% with dyspnea noted   FUNCTIONAL TESTs:  06/22/2022  Static standing balance with BUE support on RW for 1 min with minA.     CURRENT PROSTHETIC WEAR ASSESSMENT: 06/22/2022 Patient is dependent with: skin check, residual limb care, care of non-amputated limb, prosthetic cleaning, ply sock cleaning, correct ply sock adjustment, proper wear schedule/adjustment, and proper weight-bearing schedule/adjustment Donning prosthesis: Total A and max cues required, Doffing prosthesis: Total A and wife able to doffe Prosthetic wear tolerance: 1-2 hours 1-2x/day, for 3 of 3 days since delivery.  Prosthetic weight bearing tolerance: 0 minutes unable to stand outside of PT Edema: pitting with >5sec capillary refill Residual limb condition: 91m wound with serous drainage & black scab present,  calf measurement is 42.2cm Prosthetic description:  silicon liner with pin suspension, flexible inner socket Total Contact design, dynamic response foot with hydraulic PF/DF K code/activity level with prosthetic use: Level 3     TODAY'S TREATMENT:  07/01/2022 Prosthetic Care with right TTA prosthesis & left TMA shoe filler with carbon plate: Pt arrived with ~1/2" gap at bottom of liner.  PT reviewed donning technique with issues if distal liner not in direct contact.  Pt able to donne with verbal cues & light touch on distal liner. PT recommended increasing wear to 6hrs 2x/day with 2 hours off bw wears but not to dialysis yet and  patting limb/liner dry ~ midpoint of 6 hours. PT recommended wearing prosthesis even on days that he does not feel well but decreasing activity level accordingly.  Pt & wife verbalized understanding of all instructions. PT instructed in adjusting ply socks with handout, verbal & tactile cues. Donning & ambulating with too few, too many & correct ply socks.  Pt amb 20'  X 3 with RW with CGA. PT demo & verbal cues on proper step width for stance limb weight bearing over limb & improve hip stability. Pt ambulated 51' with RW with visual cues on RW with supervision & verbal cues. PT demo & verbal cues on car transfer with RW; pt return demo & verbalized understanding with CGA.    06/29/2022 Prosthetic Care with right TTA prosthesis & left TMA shoe filler with carbon plate: Prosthesis & liner weigh 2.3kg  PT instructed in subtracting from dialysis weigh-in & weigh-out. PT anticipating wear to increase by next week to use prosthesis for transfers with dialysis. Pt & wife verbalized understanding.  PT recommended wear 4-5 hours 2x/day now.  PT instructed in sweating & need to dry limb / liner if noted then immediately redonning to complete wear time.  PT verbally reviewed adjusting ply socks. Pt verbalized understanding.  PT demo & verbal cues on sit to/from stand technique.  He improved from minA to contact assist only during session from 21" w/c & 18" chair with armrests. Pt amb 43' (max tolerable) with RW with min guard. Verbal & tactile cues on upright posture, RW location and wt shift over prosthesis in stance.  PT demo & verbal cues on stand pivot transfers using RW. Pt performed with contact assist. PT instructed pt & wife in how to safely provide assist for gait.  PT had wife count steps for 20' = 11 of her steps.  Set w/c 20' from chair with armrest. She provided contact assist 36' X2 for prosthetic gait with RW with PT supervising with any issues.  PT recommending 6-8 times during day with plan to  progress distance & frequency over time. Pt & wife verbalized understanding.    06/24/2022 Prosthetic Care with right TTA prosthesis & left TMA shoe filler with carbon plate: PT reviewed LTG of wear most of awake hours and rationale for function.  PT currently recommending wear 2hrs 2x/day including sitting for work.  Pt did report transfers are better with prosthesis. PT instructed in weighing prosthesis for dialysis and subtracting weight from weigh-in & weigh-out at dialysis. Pt & wife verbalized understanding.   Neuromuscular Re-ed  Working on standing balance, standing endurance and proprioception using residual limb / socket interface -  Pt performed 3-5 reps of ea exercise for comprehension. Pt & wife verbalize understanding.   Do each exercise 1-2  times per day Do each exercise 5-10 repetitions Hold each exercise for 2 seconds to feel your location  AT [place].  Try to find this position when standing still for activities.   USE TAPE ON FLOOR TO MARK THE MIDLINE POSITION which is even with middle of sink.  You also should try to feel with your limb pressure in socket.  You are trying to feel with limb what you used to feel with the bottom of your foot.  Side to Side Shift: Moving your hips only (not shoulders): move weight onto your left leg, HOLD/FEEL pressure in socket.  Move back to equal weight on each leg, HOLD/FEEL pressure in socket. Move weight onto your right leg, HOLD/FEEL pressure in socket. Move back to equal weight on each leg, HOLD/FEEL pressure in socket. Repeat.  Start with both hands on sink, progress to hand on prosthetic side only, then no hands.  Front to Back Shift: Moving your hips only (not shoulders): move your weight forward onto your toes, HOLD/FEEL pressure in  socket. Move your weight back to equal Flat Foot on both legs, HOLD/FEEL  pressure in socket. Move your weight back onto your heels,  HOLD/FEEL  pressure in socket. Move your weight back to equal on both legs, HOLD/FEEL  pressure in socket. Repeat.  Start with both hands on sink, progress to hand on prosthetic side only, then no hands.  Moving Cones / Cups: With equal weight on each leg: Hold on with one hand the first time, then progress to no hand supports. Move cups from one side of sink to the other. Place cups ~2" out of your reach, progress to 10" beyond reach.  Place one hand in middle of sink and reach with other hand. Do both arms.  Then hover one hand and move cups with other hand.  Overhead/Upward Reaching: alternated reaching up to top cabinets or ceiling if no cabinets present. Keep equal weight on each leg. Start with one hand support on counter while other hand reaches and progress to no hand support with reaching.  ace one hand in middle of sink and reach with other hand. Do both arms.  Then hover one hand and move cups with other hand.  5.   Looking Over Shoulders: With equal weight on each leg: alternate turning to look over your shoulders with one hand support on counter as needed.  Start with head motions only to look in front of shoulder, then even with shoulder and progress to looking behind you. To look to side, move head /eyes, then shoulder on side looking pulls back, shift more weight to side looking and pull hip back. Place one hand in middle of sink and let go with other hand so your shoulder can pull back. Switch hands to look other way.   Then hover one hand and look over shoulder. If looking right, use left hand at sink. If looking left, use right hand at sink. 6.  Stepping with leg that is not amputated:  Move items under cabinet out of your way. Shift your hips/pelvis so weight on prosthesis. Tighten muscles in hip on prosthetic side.  SLOWLY step other leg so front of foot is in cabinet. Then step back to floor.   Pt also recommending sitting at edge of w/c with feet supported on floor but no back support  each hour in w/c until back fatigues. PT also demo & instructed in sitting on 24" stool. Pt & wife verbalized understanding.     ASSESSMENT:   CLINICAL IMPRESSION: Pt & wife appear to have better understanding of donning & adjusting ply socks after PT instruction.  He reports car transfers are easier with prosthesis & RW.  He is improving his mobility with prosthesis & continues to benefit from skilled PT.    OBJECTIVE IMPAIRMENTS Abnormal gait, cardiopulmonary status limiting activity, decreased activity tolerance, decreased balance, decreased endurance, decreased knowledge of condition, decreased knowledge of use of DME, decreased mobility, difficulty walking, decreased ROM, decreased strength, increased edema, impaired flexibility, postural dysfunction, and prosthetic dependency .    ACTIVITY LIMITATIONS carrying, lifting, bending, sitting, standing, squatting, stairs, transfers, locomotion level, and prosthesis use   PARTICIPATION LIMITATIONS: driving, community activity, occupation, yard work, and recreational of camping   [place], Past/current experiences, Time since onset of injury/illness/exacerbation, and 3+ comorbidities: see PMH  are also affecting patient's functional outcome.    REHAB POTENTIAL: Good   CLINICAL DECISION MAKING: Evolving/moderate complexity   EVALUATION COMPLEXITY: Moderate     GOALS: Goals reviewed with patient?  Yes   SHORT TERM GOALS: Target date: 07/22/2022   Patient donnes prosthesis modified independent & verbalizes proper cleaning. Baseline: SEE OBJECTIVE DATA Goal status: INITIAL 2.  Patient tolerates prosthesis >10 hrs total /day without increase in skin issues & no limb pain. Baseline: SEE OBJECTIVE DATA Goal status: INITIAL   3.  Patient able to reach 5" and look over both shoulders with RW support with supervision. Baseline: SEE OBJECTIVE DATA Goal status: INITIAL   4. Patient ambulates 55' with RW & prosthesis with  minA. Baseline: SEE OBJECTIVE DATA Goal status: INITIAL       LONG TERM GOALS: Target date: 08/27/2022   Patient demonstrates & verbalized understanding of prosthetic care to enable safe utilization of prosthesis. Baseline: SEE OBJECTIVE DATA Goal status: INITIAL   Patient tolerates prosthesis wear >90% of awake hours without skin or limb pain issues. Baseline: SEE OBJECTIVE DATA Goal status: INITIAL   Berg Balance >/= 36/56 to indicate lower fall risk Baseline: SEE OBJECTIVE DATA Goal status: INITIAL   Patient ambulates >300' with prosthesis & LRAD independently Baseline: SEE OBJECTIVE DATA Goal status: INITIAL   Patient negotiates ramps, curbs & stairs with single rail with prosthesis & LRAD independently. Baseline: SEE OBJECTIVE DATA Goal status: INITIAL     PLAN: PT FREQUENCY: 2x/week   PT DURATION: 10 weeks   PLANNED INTERVENTIONS: Therapeutic exercises, Therapeutic activity, Neuromuscular re-education, Balance training, Gait training, Patient/Family education, Self Care, Stair training, Vestibular training, Canalith repositioning, Prosthetic training, DME instructions, Vasopneumatic device, and physical performance testing   PLAN FOR NEXT SESSION:  Prosthetic gait with RW & instruct in ramps & curbs. standing balance.    Jamey Reas, PT, DPT 07/01/2022, 10:08 AM

## 2022-07-01 NOTE — Patient Instructions (Signed)
Hanger Socks: 1-ply is yellow color at top, 3-ply is green at top, 5-ply is navy blue at top How many ply you need depends on your limb size.  You should have even pressure on your limb when standing & walking.  Guidance points: 1. How ease it goes on? Should be some resistance. Too few it goes on too easily. Too many it takes a lot of work to get it on. 2. How many clicks you get. Especially clicks in sitting. 3. After standing or walking, check knee cap. Bottom should be just under the front lip.  Too few bottom of knee cap sits on indention. Too many bottom is above front lip. 4. Have your feet beside each other & hips over feet. Place hands on your waist. Pelvis Should be level. Too few prosthetic side will be low. Too many prosthetic side will be high.    Get ply socks correct before you leave the house. Take extra socks with you. Take one 3-ply and two 1-ply with you. This is in addition to what you are wearing.

## 2022-07-02 DIAGNOSIS — Z992 Dependence on renal dialysis: Secondary | ICD-10-CM | POA: Diagnosis not present

## 2022-07-02 DIAGNOSIS — N186 End stage renal disease: Secondary | ICD-10-CM | POA: Diagnosis not present

## 2022-07-02 DIAGNOSIS — N2581 Secondary hyperparathyroidism of renal origin: Secondary | ICD-10-CM | POA: Diagnosis not present

## 2022-07-02 NOTE — Progress Notes (Signed)
Cardiology Office Note:   Date:  07/03/2022  NAME:  Jeffrey Ayala    MRN: [id number] DOB:  1961-03-20   PCP:  [name], NP-C  Cardiologist:  Evalina Field, MD  Electrophysiologist:  None   Referring MD: Jerline Pain, MD   Chief Complaint  Patient presents with   Follow-up   History of Present Illness:   Jeffrey Ayala is a 61 y.o. male with a hx of cardiac arrest, AKI on HD, pAF who presents for follow-up. Having recurrent anemia. AC on hold.   He is still having troubles with anemia.  Most recent hemoglobin 7.4.  Hemoglobin values have been up and down.  No clear source of bleeding.  He will meet with GI next month to undergo an endoscopy.  He also will meet with hematology.  I have informed him he must stay off Coumadin so this is resolved.  He understands his blood counts are low.  He is in A-fib heart rates in the 100s today.  He has not been taking metoprolol.  Blood pressure has been low 108/60.  I informed him it is okay to try to take metoprolol that should be okay despite his low blood pressure.  He denies any chest pain or trouble breathing.  He still has a tunneled dialysis catheter.  He is holding off on AV fistula until he recovers with physical therapy.  He is still learning to walk with his prosthesis.  He denies chest pain or trouble breathing.  He is relatively unaware of his atrial fibrillation.  Denies any symptoms today.  Problem List Cardiac Arrest -2/2 septic shock 02/22/2022-04/03/2022 -scrotal infection  -2/2 troponin elevation, normal echo 2. AKI on HD -TTS perm cath  3. LE gangrene -s/p L transmetatarsal amputation, R BKA -2/2 pressors from shock  4. Paroxysmal Afib -CHADSVASC=2 (CAD, HTN)  5. Anemia -AC on hold   Past Medical History: Past Medical History:  Diagnosis Date   Concussion    Eosinophilic esophagitis    EGD/bx by Dr. Havery Moros   Gastric ulcer    History of kidney stones    Intermittent dysphagia    Migraine     Past  Surgical History: Past Surgical History:  Procedure Laterality Date   AMPUTATION Right 03/15/2022   Procedure: RIGHT BELOW KNEE AMPUTATION;  Surgeon: [name], MD;  Location: [facility];  Service: Orthopedics;  Laterality: Right;   CYSTOSCOPY     DENTAL SURGERY     FINGER SURGERY     HERNIA REPAIR     at age 23   IR FLUORO GUIDE CV LINE RIGHT  03/09/2022   IR PATIENT EVAL TECH 0-60 MINS  03/09/2022   IR US GUIDE VASC ACCESS RIGHT  03/09/2022   KNEE ARTHROSCOPY Left    LITHOTRIPSY     NEPHROLITHOTOMY     SCROTAL EXPLORATION N/A 03/16/2022   Procedure: IRRIGATION AND DEBRIDEMENT SCROTUM;  Surgeon: Raynelle Bring, MD;  Location: [place];  Service: Urology;  Laterality: N/A;   SCROTAL EXPLORATION N/A 03/28/2022   Procedure: IRRIGATION AND DEBRIDEMENT SCROTUM;  Surgeon: Ardis Hughs, MD;  Location: [facility];  Service: Urology;  Laterality: N/A;   TONSILLECTOMY     TOTAL KNEE ARTHROPLASTY Bilateral 11/04/2018   Procedure: BILATERAL TOTAL KNEE ARTHROPLASTY;  Surgeon: [name], MD;  Location: [place];  Service: Orthopedics;  Laterality: Bilateral;  spinal/epidural per anesthesiologist   TRANSMETATARSAL AMPUTATION Left 03/15/2022   Procedure: LEFT TRANSMETATARSAL AMPUTATION;  Surgeon: Meridee Score  V, MD;  Location: [facility];  Service: Orthopedics;  Laterality: Left;    Current Medications: Current Meds  Medication Sig   acetaminophen (TYLENOL) 650 MG CR tablet Take 650 mg by mouth every 8 (eight) hours as needed for pain or fever.   allopurinol (ZYLOPRIM) 100 MG tablet Take 1 tablet (100 mg total) by mouth daily.   escitalopram (LEXAPRO) 10 MG tablet Take 1 tablet (10 mg total) by mouth daily.   multivitamin (RENA-VIT) TABS tablet Take 1 tablet by mouth at bedtime.   pantoprazole (PROTONIX) 40 MG tablet Take 1 tablet (40 mg total) by mouth 2 (two) times daily.   traZODone (DESYREL) 50 MG tablet Take 0.5-1 tablets (25-50 mg total) by mouth at bedtime as needed for sleep.     Allergies:     Codeine and Flomax [tamsulosin hcl]   Social History: Social History   Socioeconomic History   Marital status: Married    Spouse name: Not on file   Number of children: 2   Years of education: Not on file   Highest education level: Not on file  Occupational History   Occupation: Press photographer - Clinical cytogeneticist  Tobacco Use   Smoking status: Never   Smokeless tobacco: Never  Vaping Use   Vaping Use: Never used  Substance and Sexual Activity   Alcohol use: Not Currently    Comment: occassional   Drug use: No   Sexual activity: Not Currently  Other Topics Concern   Not on file  Social History Narrative   Not on file   Social Determinants of Health   Financial Resource Strain: Not on file  Food Insecurity: Not on file  Transportation Needs: Not on file  Physical Activity: Not on file  Stress: Not on file  Social Connections: Not on file     Family History: The patient's family history includes Arrhythmia in his mother; COPD in his mother; Cancer in his brother; Sarcoidosis in his brother. There is no history of Stomach cancer, Pancreatic cancer, Esophageal cancer, Colon cancer, or Rectal cancer.  ROS:   All other ROS reviewed and negative. Pertinent positives noted in the HPI.     EKGs/Labs/Other Studies Reviewed:   The following studies were personally reviewed by me today:  TTE 02/23/2022  1. Left ventricular ejection fraction, by estimation, is 60 to 65%. The  left ventricle has normal function. The left ventricle has no regional  wall motion abnormalities. There is mild left ventricular hypertrophy.  Left ventricular diastolic function  could not be evaluated.   2. Right ventricular systolic function is normal. The right ventricular  size is normal.   3. The mitral valve is normal in structure. No evidence of mitral valve  regurgitation. No evidence of mitral stenosis.   4. The aortic valve is tricuspid. Aortic valve regurgitation is not  visualized. No aortic  stenosis is present.   5. The inferior vena cava is dilated in size with <50% respiratory  variability, suggesting right atrial pressure of 15 mmHg.   Recent Labs: 02/22/2022: B Natriuretic Peptide 307.0; TSH 0.687 04/18/2022: Magnesium 1.9 06/09/2022: ALT 6; BUN 36; Creatinine, Ser 5.69; Hemoglobin 7.4 Repeated and verified X2.; Platelets 240.0; Potassium 3.1; Sodium 139   Recent Lipid Panel    Component Value Date/Time   TRIG 159 (H) 03/17/2022 0754    Physical Exam:   VS:  BP 108/68   Pulse (!) 106   Ht '5\' 10"'$  (1.778 m)   Wt 194 lb (88 kg)  SpO2 97%   BMI 27.84 kg/m    Wt Readings from Last 3 Encounters:  07/03/22 194 lb (88 kg)  05/13/22 196 lb 3.2 oz (89 kg)  04/27/22 190 lb (86.2 kg)    General: Well nourished, well developed, in no acute distress Head: Atraumatic, normal size  Eyes: PEERLA, EOMI  Neck: Supple, no JVD Endocrine: No thryomegaly Cardiac: Normal S1, S2; irregular rhythm, no murmurs rubs or gallops Lungs: Clear to auscultation bilaterally, no wheezing, rhonchi or rales  Abd: Soft, nontender, no hepatomegaly  Ext: No edema, pulses 2+ Musculoskeletal: No deformities, BUE and BLE strength normal and equal Skin: Warm and dry, no rashes   Neuro: Alert and oriented to person, place, time, and situation, CNII-XII grossly intact, no focal deficits  Psych: Normal mood and affect   ASSESSMENT:   SHI BLANKENSHIP is a 61 y.o. male who presents for the following: 1. Paroxysmal atrial fibrillation ([name])     PLAN:   1. Paroxysmal atrial fibrillation ([name]) -Remains in rate controlled atrial fibrillation.  Okay to take metoprolol.  Blood pressure is not that low.  We will see how he does.  He developed atrial fibrillation in setting of septic shock, cardiac arrest and acute kidney injury requiring hemodialysis.  Remains on dialysis.  Now struggling with recurrent anemia without definitive source of bleeding.  He cannot be on Coumadin at this point.  He did not  tolerate Eliquis.  We will need to have his anemia resolved before it is safe for him to be on a blood thinner.  This unfortunately precludes him from TEE/cardioversion.  Once his anemia is resolved he should be able to have his atrial fibrillation addressed.  He does understand the importance of having anemia resolved before resuming his anticoagulation.  We will plan to see him back in 2 months.  He should see hematology and GI before that appointment.  Disposition: Return in about 2 months (around 09/02/2022).  Medication Adjustments/Labs and Tests Ordered: Current medicines are reviewed at length with the patient today.  Concerns regarding medicines are outlined above.  No orders of the defined types were placed in this encounter.  No orders of the defined types were placed in this encounter.   Patient Instructions  Medication Instructions:  Continue to hold Coumadin Take Metoprolol 12.5 mg twice daily   *If you need a refill on your cardiac medications before your next appointment, please call your pharmacy*   Follow-Up: At [facility], you and your health needs are our priority.  As part of our continuing mission to provide you with exceptional heart care, we have created designated Provider Care Teams.  These Care Teams include your primary Cardiologist (physician) and Advanced Practice Providers (APPs -  Physician Assistants and Nurse Practitioners) who all work together to provide you with the care you need, when you need it.  We recommend signing up for the patient portal called "MyChart".  Sign up information is provided on this After Visit Summary.  MyChart is used to connect with patients for Virtual Visits (Telemedicine).  Patients are able to view lab/test results, encounter notes, upcoming appointments, etc.  Non-urgent messages can be sent to your provider as well.   To learn more about what you can do with MyChart, go to [email or web address].    Your next appointment:    2 month(s)  The format for your next appointment:   In Person  Provider:   Evalina Field, MD  Time Spent with Patient: I have spent a total of 25 minutes with patient reviewing hospital notes, telemetry, EKGs, labs and examining the patient as well as establishing an assessment and plan that was discussed with the patient.  > 50% of time was spent in direct patient care.  Signed, Addison Naegeli. Audie Box, MD, [place]  [address] [place], [place] [address] [phone]  07/03/2022 1:58 PM

## 2022-07-03 ENCOUNTER — Ambulatory Visit (INDEPENDENT_AMBULATORY_CARE_PROVIDER_SITE_OTHER): Payer: BC Managed Care – PPO | Admitting: Cardiovascular Disease

## 2022-07-03 ENCOUNTER — Other Ambulatory Visit: Payer: Self-pay | Admitting: Cardiovascular Disease

## 2022-07-03 ENCOUNTER — Encounter: Payer: Self-pay | Admitting: Cardiovascular Disease

## 2022-07-03 ENCOUNTER — Other Ambulatory Visit: Payer: Self-pay | Admitting: Family Medicine

## 2022-07-03 VITALS — BP 108/68 | HR 106 | Ht 70.0 in | Wt 194.0 lb

## 2022-07-03 DIAGNOSIS — I4891 Unspecified atrial fibrillation: Secondary | ICD-10-CM

## 2022-07-03 DIAGNOSIS — F322 Major depressive disorder, single episode, severe without psychotic features: Secondary | ICD-10-CM

## 2022-07-03 DIAGNOSIS — I480 Paroxysmal atrial fibrillation: Secondary | ICD-10-CM

## 2022-07-03 NOTE — Patient Instructions (Signed)
Medication Instructions:  Continue to hold Coumadin Take Metoprolol 12.5 mg twice daily   *If you need a refill on your cardiac medications before your next appointment, please call your pharmacy*   Follow-Up: At [facility], you and your health needs are our priority.  As part of our continuing mission to provide you with exceptional heart care, we have created designated Provider Care Teams.  These Care Teams include your primary Cardiologist (physician) and Advanced Practice Providers (APPs -  Physician Assistants and Nurse Practitioners) who all work together to provide you with the care you need, when you need it.  We recommend signing up for the patient portal called "MyChart".  Sign up information is provided on this After Visit Summary.  MyChart is used to connect with patients for Virtual Visits (Telemedicine).  Patients are able to view lab/test results, encounter notes, upcoming appointments, etc.  Non-urgent messages can be sent to your provider as well.   To learn more about what you can do with MyChart, go to [email or web address].    Your next appointment:   2 month(s)  The format for your next appointment:   In Person  Provider:   Evalina Field, MD

## 2022-07-04 DIAGNOSIS — N186 End stage renal disease: Secondary | ICD-10-CM | POA: Diagnosis not present

## 2022-07-04 DIAGNOSIS — N2581 Secondary hyperparathyroidism of renal origin: Secondary | ICD-10-CM | POA: Diagnosis not present

## 2022-07-04 DIAGNOSIS — Z992 Dependence on renal dialysis: Secondary | ICD-10-CM | POA: Diagnosis not present

## 2022-07-06 ENCOUNTER — Ambulatory Visit (INDEPENDENT_AMBULATORY_CARE_PROVIDER_SITE_OTHER): Payer: BC Managed Care – PPO | Admitting: Physical Therapy

## 2022-07-06 ENCOUNTER — Encounter: Payer: Self-pay | Admitting: Physical Therapy

## 2022-07-06 DIAGNOSIS — R2681 Unsteadiness on feet: Secondary | ICD-10-CM

## 2022-07-06 DIAGNOSIS — R609 Edema, unspecified: Secondary | ICD-10-CM

## 2022-07-06 DIAGNOSIS — R2689 Other abnormalities of gait and mobility: Secondary | ICD-10-CM

## 2022-07-06 DIAGNOSIS — L98498 Non-pressure chronic ulcer of skin of other sites with other specified severity: Secondary | ICD-10-CM | POA: Diagnosis not present

## 2022-07-06 DIAGNOSIS — M6281 Muscle weakness (generalized): Secondary | ICD-10-CM

## 2022-07-06 DIAGNOSIS — M21372 Foot drop, left foot: Secondary | ICD-10-CM

## 2022-07-06 NOTE — Therapy (Signed)
OUTPATIENT PHYSICAL THERAPY TREATMENT NOTE   Patient Name: Jeffrey Ayala MRN: [id number] DOB:[birth year], 61 y.o., male Today's Date: 07/06/2022  PCP: [name], NP-C REFERRING PROVIDER: [name], MD  END OF SESSION:   PT End of Session - 07/06/22 1513     Visit Number 5    Number of Visits 20    Authorization Type [employer] comm PPO    Authorization Time Period 20 visit limit PT/OT/chiro, $35 co-pay    Authorization - Visit Number 5    Authorization - Number of Visits 20    Progress Note Due on Visit 10    PT Start Time 1515    PT Stop Time 1558    PT Time Calculation (min) 43 min    Equipment Utilized During Treatment Gait belt    Activity Tolerance Patient tolerated treatment well;Patient limited by fatigue    Behavior During Therapy WFL for tasks assessed/performed;Flat affect                Past Medical History:  Diagnosis Date   Concussion    Eosinophilic esophagitis    EGD/bx by Dr. Havery Moros   Gastric ulcer    History of kidney stones    Intermittent dysphagia    Migraine    Past Surgical History:  Procedure Laterality Date   AMPUTATION Right 03/15/2022   Procedure: RIGHT BELOW KNEE AMPUTATION;  Surgeon: [name], MD;  Location: [place];  Service: Orthopedics;  Laterality: Right;   CYSTOSCOPY     DENTAL SURGERY     FINGER SURGERY     HERNIA REPAIR     at age 89   IR FLUORO GUIDE CV LINE RIGHT  03/09/2022   IR PATIENT EVAL TECH 0-60 MINS  03/09/2022   IR US GUIDE VASC ACCESS RIGHT  03/09/2022   KNEE ARTHROSCOPY Left    LITHOTRIPSY     NEPHROLITHOTOMY     SCROTAL EXPLORATION N/A 03/16/2022   Procedure: IRRIGATION AND DEBRIDEMENT SCROTUM;  Surgeon: Raynelle Bring, MD;  Location: [place];  Service: Urology;  Laterality: N/A;   SCROTAL EXPLORATION N/A 03/28/2022   Procedure: IRRIGATION AND DEBRIDEMENT SCROTUM;  Surgeon: Ardis Hughs, MD;  Location: [place];  Service: Urology;  Laterality: N/A;   TONSILLECTOMY     TOTAL KNEE ARTHROPLASTY  Bilateral 11/04/2018   Procedure: BILATERAL TOTAL KNEE ARTHROPLASTY;  Surgeon: [name], MD;  Location: [place];  Service: Orthopedics;  Laterality: Bilateral;  spinal/epidural per anesthesiologist   TRANSMETATARSAL AMPUTATION Left 03/15/2022   Procedure: LEFT TRANSMETATARSAL AMPUTATION;  Surgeon: [name], MD;  Location: [place];  Service: Orthopedics;  Laterality: Left;   Patient Active Problem List   Diagnosis Date Noted   Suicidal ideations 06/09/2022   Chills (without fever) 06/09/2022   Severe depression ([place]) 06/09/2022   Acute cough 06/09/2022   Reactive depression 05/31/2022   Belching 05/27/2022   Gastroesophageal reflux disease 05/27/2022   Atrial fibrillation ([place]) 05/20/2022   Long term (current) use of anticoagulants 05/20/2022   S/P transmetatarsal amputation of foot, left ([place]) 04/18/2022   CKD (chronic kidney disease) stage 5, GFR less than 15 ml/min ([place]) 04/18/2022   Secondary hyperparathyroidism of renal origin ([place]) 04/10/2022   Pruritus, unspecified 04/10/2022   Nausea 04/10/2022   Iron deficiency anemia, unspecified 04/10/2022   Hypothyroidism, unspecified 04/10/2022   Other acute kidney failure ([place]) 04/10/2022   Dyspnea, unspecified 50/07/3817   Eosinophilic esophagitis 29/93/7169   Gastric ulcer, unspecified as acute or chronic, without  hemorrhage or perforation 04/10/2022   Coagulation defect, unspecified ([place]) 04/10/2022   Allergy, unspecified, initial encounter 04/10/2022   Anaphylactic shock, unspecified, initial encounter 04/10/2022   Arthralgia of right lower leg    Acute blood loss anemia    Unilateral complete BKA, right, initial encounter ([place]) 04/03/2022   S/P BKA (below knee amputation), right ([place]) 04/03/2022   Old myocardial infarction 04/02/2022   Dependence on renal dialysis ([place]) 04/02/2022   Acquired absence of right leg below knee ([place]) 04/02/2022   Cirrhosis ([place]) 03/31/2022   Fournier's gangrene in male    Scrotal infection  03/25/2022   Normocytic anemia 03/25/2022   Diarrhea 03/25/2022   Shock liver 03/25/2022   Acute respiratory failure with hypoxia ([place]) 03/25/2022   Gout 03/25/2022   Acute nonintractable headache 03/25/2022   Demand ischemia ([place]) 03/25/2022   Thrombocytopenia ([place]) 03/25/2022   Obesity, Class III, BMI 40-49.9 (morbid obesity) ([place]) 03/25/2022   Cardiac arrest ([place]) 03/25/2022   Hypotension    Respiratory insufficiency    Cellulitis    Malnutrition of moderate degree 03/12/2022   Altered mental status    Gangrene of right foot ([place])    Gangrene of left foot ([place])    Severe protein-calorie malnutrition ([place]) 03/10/2022   Encounter for central line placement    Pressure injury of skin 02/23/2022   Septic shock ([place]) 02/22/2022   AKI (acute kidney injury) ([place])    Generalized abdominal pain    Lactic acidosis    Atrial fibrillation with RVR ([name])    Hypokalemia    Hypomagnesemia    Pain in finger of right hand 08/14/2021   Degenerative joint disease of shoulder region 05/07/2020   Olecranon bursitis of right elbow 05/07/2020   Pain in joint of right shoulder 04/26/2020   S/P TKR (total knee replacement), bilateral 11/04/2018   Bilateral primary osteoarthritis of knee    DDD (degenerative disc disease), cervical 01/10/2018   Rupture of medial head of gastrocnemius, left, sequela 01/14/2017   Kidney stones 06/22/2016   Essential hypertension 08/01/2010   DYSTHYMIC DISORDER 07/08/2010   PALPITATIONS 07/08/2010    REFERRING DIAG: Z89.511 (ICD-10-CM) - Hx of below knee amputation, right   ONSET DATE: 06/19/2022 Prosthesis delivery  THERAPY DIAG:  Other abnormalities of gait and mobility  Muscle weakness (generalized)  Unsteadiness on feet  Non-pressure chronic ulcer of skin of other sites with other specified severity ([name])  Edema, unspecified type  Foot drop, left  Rationale for Evaluation and Treatment Rehabilitation  PERTINENT HISTORY: Rt BKA 03/15/22, Left MTA  03/15/2022, bil. TKA 11/04/18, ESRD / chronic kidney disease st5 dialysis Tues/Thurs/Sat, cirrhosis, cardiac arrest May 23 post intubation, A-Fib  PRECAUTIONS: Fall  SUBJECTIVE:   He has been wearing prosthesis up to 6 hours 2x/day on non-dialysis days & ~1.5hrs before dialysis &5-6 hours after until bed time. He feels that his limb is moving up & down inside socket.   PAIN:  Are you having pain? No   OBJECTIVE: (objective measures completed at initial evaluation unless otherwise dated)  OBJECTIVE:    COGNITION: 06/22/2022  Overall cognitive status: Within functional limits for tasks assessed and pt had flat affect so unable to fully assess cognition.   POSTURE: 06/22/2022  rounded shoulders, forward head, flexed trunk , and weight shift left   LOWER EXTREMITY ROM:   ROM P:passive  A:active Right Eval 06/22/2022 Left Eval 06/22/2022  Hip flexion      Hip extension Standing A: ~30* limitation Standing A: ~30* limitation  Hip abduction      Hip adduction      Hip internal rotation      Hip external rotation      Knee flexion      Knee extension Standing A: ~15* limitation Standing A: ~15* limitation  Ankle dorsiflexion   Standing A: ~25* limitation  Ankle plantarflexion      Ankle inversion      Ankle eversion       (Blank rows = not tested)   LOWER EXTREMITY MMT:   MMT Right Eval 06/22/2022 Left Eval 06/22/2022  Hip flexion 4/5 4/5  Hip extension 2/5 functional 2/5 functional  Hip abduction 2/5 functional 2/5 functional  Hip adduction      Hip internal rotation      Hip external rotation      Knee flexion      Knee extension 3-/5 functional 3-/5 functional  Ankle dorsiflexion 2-/5    Ankle plantarflexion      Ankle inversion      Ankle eversion      (Blank rows = not tested)   TRANSFERS: 06/22/2022  Sit to stand: Mod A and from 22" w/c requiring armrests to push & RW to stabilize 06/22/2022  Stand to sit: Min A from RW to 22" w/c unable to release RW to  reach for w/c until seated.   GAIT: 06/22/2022 Gait pattern: step to pattern, decreased step length- Left, decreased stance time- Right, decreased stride length, decreased hip/knee flexion- Right, circumduction- Right, Right hip hike, knee flexed in stance- Right, knee flexed in stance- Left, antalgic, trunk flexed, abducted- Right, and poor foot clearance- Left excessive weightbearing BUEs on RW Distance walked: 5' Assistive device utilized: Environmental consultant - 2 wheeled and right TTA prosthesis / left plate & shoe filler Level of assistance: Mod A Comments: post gait HR 82, SpO2 99% with dyspnea noted   FUNCTIONAL TESTs:  06/22/2022  Static standing balance with BUE support on RW for 1 min with minA.     CURRENT PROSTHETIC WEAR ASSESSMENT: 06/22/2022 Patient is dependent with: skin check, residual limb care, care of non-amputated limb, prosthetic cleaning, ply sock cleaning, correct ply sock adjustment, proper wear schedule/adjustment, and proper weight-bearing schedule/adjustment Donning prosthesis: Total A and max cues required, Doffing prosthesis: Total A and wife able to doffe Prosthetic wear tolerance: 1-2 hours 1-2x/day, for 3 of 3 days since delivery.  Prosthetic weight bearing tolerance: 0 minutes unable to stand outside of PT Edema: pitting with >5sec capillary refill Residual limb condition: 80m wound with serous drainage & black scab present,  calf measurement is 42.2cm Prosthetic description:  silicon liner with pin suspension, flexible inner socket Total Contact design, dynamic response foot with hydraulic PF/DF K code/activity level with prosthetic use: Level 3     TODAY'S TREATMENT:  07/06/2022 Prosthetic Care with right TTA prosthesis & left TMA shoe filler with carbon plate: Pt reports prosthesis feels like it is moving up with weight bearing & pulling down with swing.  PT assessed in standing with <1/4" movement in patella relief between weight bearing & open chain which is  probably soft tissue normal movement. Wound is not changing in size but scab is white indicating excessive moisture.  PT instructed in use of Vivewear underliner for wound healing.  PT recommended switching underliner between 1st & 2nd wears. PT instructed in cleaning & drying nightly. Pt & wife verbalized understanding. PT recommended wearing prosthesis from getting out of bath in mornings until bedtime except 2 hours midday.  Wearing prosthesis to dialysis and removing for 2 hour break when he gets home. Pt verbalized understanding of subtracting prosthesis weight of 2.3kg from weigh-in & weigh-out at dialysis. Pt & wife verbalized understanding.   Pt ambulated 71' with RW with supervision with visual & verbal cues for step width and upright posture. PT verbal & demo cues on neg ramp & curb. Pt neg with RW with supervision.  Pt ambulated 205' from clinic to car with RW with supervision. He reports furthest that he has walked since amputation. PT recommended sitting on all furniture at home to make sure that he can arise. Then not using w/c for sitting in home unless fatigued like after dialysis.  PT recommended ambulating with wife's supervision to change locations in home. PT demo how to follow with w/c & supervise for long distance. PT recommended walking max tolerable distance 1-2 x/day with assumption that dialysis days will be less than non-dialysis days. Pt & wife verbalized understanding.  07/01/2022 Prosthetic Care with right TTA prosthesis & left TMA shoe filler with carbon plate: Pt arrived with ~1/2" gap at bottom of liner.  PT reviewed donning technique with issues if distal liner not in direct contact.  Pt able to donne with verbal cues & light touch on distal liner. PT recommended increasing wear to 6hrs 2x/day with 2 hours off bw wears but not to dialysis yet and patting limb/liner dry ~ midpoint of 6 hours. PT recommended wearing prosthesis even on days that he does not feel well but  decreasing activity level accordingly.  Pt & wife verbalized understanding of all instructions. PT instructed in adjusting ply socks with handout, verbal & tactile cues. Donning & ambulating with too few, too many & correct ply socks.  Pt amb 20' X 3 with RW with CGA. PT demo & verbal cues on proper step width for stance limb weight bearing over limb & improve hip stability. Pt ambulated 61' with RW with visual cues on RW with supervision & verbal cues. PT demo & verbal cues on car transfer with RW; pt return demo & verbalized understanding with CGA.    06/29/2022 Prosthetic Care with right TTA prosthesis & left TMA shoe filler with carbon plate: Prosthesis & liner weigh 2.3kg  PT instructed in subtracting from dialysis weigh-in & weigh-out. PT anticipating wear to increase by next week to use prosthesis for transfers with dialysis. Pt & wife verbalized understanding.  PT recommended wear 4-5 hours 2x/day now.  PT instructed in sweating & need to dry limb / liner if noted then immediately redonning to complete wear time.  PT verbally reviewed adjusting ply socks. Pt verbalized understanding.  PT demo & verbal cues on sit to/from stand technique.  He improved from minA to contact assist only during session from 21" w/c & 18" chair with armrests. Pt amb 43' (max tolerable) with RW with min guard. Verbal & tactile cues on upright posture, RW location and wt shift over prosthesis in stance.  PT demo & verbal cues on stand pivot transfers using RW. Pt performed with contact assist. PT instructed pt & wife in how to safely provide assist for gait.  PT had wife count steps for 20' = 11 of her steps.  Set w/c 20' from chair with armrest. She provided contact assist 77' X2 for prosthetic gait with RW with PT supervising with any issues.  PT recommending 6-8 times during day with plan to progress distance & frequency over time. Pt & wife verbalized understanding.  HOME EXERCISE PROGRAM:  AT [facility] FIND YOUR  MIDLINE POSITION AND PLACE FEET EQUAL DISTANCE FROM THE MIDLINE.  Try to find this position when standing still for activities.   USE TAPE ON FLOOR TO MARK THE MIDLINE POSITION which is even with middle of sink.  You also should try to feel with your limb pressure in socket.  You are trying to feel with limb what you used to feel with the bottom of your foot.  Side to Side Shift: Moving your hips only (not shoulders): move weight onto your left leg, HOLD/FEEL pressure in socket.  Move back to equal weight on each leg, HOLD/FEEL pressure in socket. Move weight onto your right leg, HOLD/FEEL pressure in socket. Move back to equal weight on each leg, HOLD/FEEL pressure in socket. Repeat.  Start with both hands on sink, progress to hand on prosthetic side only, then no hands.  Front to Back Shift: Moving your hips only (not shoulders): move your weight forward onto your toes, HOLD/FEEL pressure in socket. Move your weight back to equal Flat Foot on both legs, HOLD/FEEL  pressure in socket. Move your weight back onto your heels, HOLD/FEEL  pressure in socket. Move your weight back to equal on both legs, HOLD/FEEL  pressure in socket. Repeat.  Start with both hands on sink, progress to hand on prosthetic side only, then no hands.  Moving Cones / Cups: With equal weight on each leg: Hold on with one hand the first time, then progress to no hand supports. Move cups from one side of sink to the other. Place cups ~2" out of your reach, progress to 10" beyond reach.  Place one hand in middle of sink and reach with other hand. Do both arms.  Then hover one hand and move cups with other hand.  Overhead/Upward Reaching: alternated reaching up to top cabinets or ceiling if no cabinets present. Keep equal weight on each leg. Start with one hand support on counter while other hand reaches and progress to no hand support with reaching.  ace one hand in middle of sink and reach with other hand. Do both arms.  Then hover one  hand and move cups with other hand.  5.   Looking Over Shoulders: With equal weight on each leg: alternate turning to look over your shoulders with one hand support on counter as needed.  Start with head motions only to look in front of shoulder, then even with shoulder and progress to looking behind you. To look to side, move head /eyes, then shoulder on side looking pulls back, shift more weight to side looking and pull hip back. Place one hand in middle of sink and let go with other hand so your shoulder can pull back. Switch hands to look other way.   Then hover one hand and look over shoulder. If looking right, use left hand at sink. If looking left, use right hand at sink. 6.  Stepping with leg that is not amputated:  Move items under cabinet out of your way. Shift your hips/pelvis so weight on prosthesis. Tighten muscles in hip on prosthetic side.  SLOWLY step other leg so front of foot is in cabinet. Then step back to floor.   Pt also recommending sitting at edge of w/c with feet supported on floor but no back support each hour in w/c until back fatigues. PT also demo & instructed in sitting on 24" stool. Pt & wife verbalized understanding.     ASSESSMENT:  CLINICAL IMPRESSION: Patient is improving prosthetic gait with RW with longer distances & less assistance.  He has a general understanding for negotiating ramps & curbs which will enable some community gait.  He is improving his mobility with prosthesis & continues to benefit from skilled PT.    OBJECTIVE IMPAIRMENTS Abnormal gait, cardiopulmonary status limiting activity, decreased activity tolerance, decreased balance, decreased endurance, decreased knowledge of condition, decreased knowledge of use of DME, decreased mobility, difficulty walking, decreased ROM, decreased strength, increased edema, impaired flexibility, postural dysfunction, and prosthetic dependency .    ACTIVITY LIMITATIONS carrying, lifting, bending, sitting, standing,  squatting, stairs, transfers, locomotion level, and prosthesis use   PARTICIPATION LIMITATIONS: driving, community activity, occupation, yard work, and recreational of camping   [place], Past/current experiences, Time since onset of injury/illness/exacerbation, and 3+ comorbidities: see PMH  are also affecting patient's functional outcome.    REHAB POTENTIAL: Good   CLINICAL DECISION MAKING: Evolving/moderate complexity   EVALUATION COMPLEXITY: Moderate     GOALS: Goals reviewed with patient? Yes   SHORT TERM GOALS: Target date: 07/22/2022   Patient donnes prosthesis modified independent & verbalizes proper cleaning. Baseline: SEE OBJECTIVE DATA Goal status: INITIAL 2.  Patient tolerates prosthesis >10 hrs total /day without increase in skin issues & no limb pain. Baseline: SEE OBJECTIVE DATA Goal status: INITIAL   3.  Patient able to reach 5" and look over both shoulders with RW support with supervision. Baseline: SEE OBJECTIVE DATA Goal status: INITIAL   4. Patient ambulates 6' with RW & prosthesis with minA. Baseline: SEE OBJECTIVE DATA Goal status: INITIAL       LONG TERM GOALS: Target date: 08/27/2022   Patient demonstrates & verbalized understanding of prosthetic care to enable safe utilization of prosthesis. Baseline: SEE OBJECTIVE DATA Goal status: INITIAL   Patient tolerates prosthesis wear >90% of awake hours without skin or limb pain issues. Baseline: SEE OBJECTIVE DATA Goal status: INITIAL   Berg Balance >/= 36/56 to indicate lower fall risk Baseline: SEE OBJECTIVE DATA Goal status: INITIAL   Patient ambulates >300' with prosthesis & LRAD independently Baseline: SEE OBJECTIVE DATA Goal status: INITIAL   Patient negotiates ramps, curbs & stairs with single rail with prosthesis & LRAD independently. Baseline: SEE OBJECTIVE DATA Goal status: INITIAL     PLAN: PT FREQUENCY: 2x/week   PT DURATION: 10 weeks   PLANNED INTERVENTIONS:  Therapeutic exercises, Therapeutic activity, Neuromuscular re-education, Balance training, Gait training, Patient/Family education, Self Care, Stair training, Vestibular training, Canalith repositioning, Prosthetic training, DME instructions, Vasopneumatic device, and physical performance testing   PLAN FOR NEXT SESSION: continue to progress Prosthetic gait with RW including ramps & curbs. standing balance.    Jamey Reas, PT, DPT 07/06/2022, 4:20 PM

## 2022-07-07 DIAGNOSIS — Z23 Encounter for immunization: Secondary | ICD-10-CM | POA: Diagnosis not present

## 2022-07-07 DIAGNOSIS — N2581 Secondary hyperparathyroidism of renal origin: Secondary | ICD-10-CM | POA: Diagnosis not present

## 2022-07-07 DIAGNOSIS — Z992 Dependence on renal dialysis: Secondary | ICD-10-CM | POA: Diagnosis not present

## 2022-07-07 DIAGNOSIS — N186 End stage renal disease: Secondary | ICD-10-CM | POA: Diagnosis not present

## 2022-07-08 ENCOUNTER — Ambulatory Visit (INDEPENDENT_AMBULATORY_CARE_PROVIDER_SITE_OTHER): Payer: BC Managed Care – PPO | Admitting: Physical Therapy

## 2022-07-08 ENCOUNTER — Encounter: Payer: Self-pay | Admitting: Physical Therapy

## 2022-07-08 DIAGNOSIS — M6281 Muscle weakness (generalized): Secondary | ICD-10-CM

## 2022-07-08 DIAGNOSIS — R2689 Other abnormalities of gait and mobility: Secondary | ICD-10-CM | POA: Diagnosis not present

## 2022-07-08 DIAGNOSIS — L98498 Non-pressure chronic ulcer of skin of other sites with other specified severity: Secondary | ICD-10-CM | POA: Diagnosis not present

## 2022-07-08 DIAGNOSIS — M21372 Foot drop, left foot: Secondary | ICD-10-CM

## 2022-07-08 DIAGNOSIS — R2681 Unsteadiness on feet: Secondary | ICD-10-CM | POA: Diagnosis not present

## 2022-07-08 DIAGNOSIS — R609 Edema, unspecified: Secondary | ICD-10-CM

## 2022-07-08 NOTE — Therapy (Signed)
OUTPATIENT PHYSICAL THERAPY TREATMENT NOTE   Patient Name: Jeffrey Ayala MRN: [id number] DOB:[birth year], 61 y.o., male Today's Date: 07/08/2022  PCP: [name], NP-C REFERRING PROVIDER: [name], MD  END OF SESSION:   PT End of Session - 07/08/22 0846     Visit Number 6    Number of Visits 20    Authorization Type [employer] comm PPO    Authorization Time Period 20 visit limit PT/OT/chiro, $35 co-pay    Authorization - Visit Number 6    Authorization - Number of Visits 20    Progress Note Due on Visit 10    PT Start Time 0845    PT Stop Time 0929    PT Time Calculation (min) 44 min    Equipment Utilized During Treatment Gait belt    Activity Tolerance Patient tolerated treatment well;Patient limited by fatigue    Behavior During Therapy WFL for tasks assessed/performed;Flat affect                 Past Medical History:  Diagnosis Date   Concussion    Eosinophilic esophagitis    EGD/bx by Dr. Havery Moros   Gastric ulcer    History of kidney stones    Intermittent dysphagia    Migraine    Past Surgical History:  Procedure Laterality Date   AMPUTATION Right 03/15/2022   Procedure: RIGHT BELOW KNEE AMPUTATION;  Surgeon: [name], MD;  Location: [facility];  Service: Orthopedics;  Laterality: Right;   CYSTOSCOPY     DENTAL SURGERY     FINGER SURGERY     HERNIA REPAIR     at age 84   IR FLUORO GUIDE CV LINE RIGHT  03/09/2022   IR PATIENT EVAL TECH 0-60 MINS  03/09/2022   IR US GUIDE VASC ACCESS RIGHT  03/09/2022   KNEE ARTHROSCOPY Left    LITHOTRIPSY     NEPHROLITHOTOMY     SCROTAL EXPLORATION N/A 03/16/2022   Procedure: IRRIGATION AND DEBRIDEMENT SCROTUM;  Surgeon: Raynelle Bring, MD;  Location: [facility];  Service: Urology;  Laterality: N/A;   SCROTAL EXPLORATION N/A 03/28/2022   Procedure: IRRIGATION AND DEBRIDEMENT SCROTUM;  Surgeon: Ardis Hughs, MD;  Location: [facility];  Service: Urology;  Laterality: N/A;   TONSILLECTOMY     TOTAL KNEE ARTHROPLASTY  Bilateral 11/04/2018   Procedure: BILATERAL TOTAL KNEE ARTHROPLASTY;  Surgeon: [name], MD;  Location: [facility];  Service: Orthopedics;  Laterality: Bilateral;  spinal/epidural per anesthesiologist   TRANSMETATARSAL AMPUTATION Left 03/15/2022   Procedure: LEFT TRANSMETATARSAL AMPUTATION;  Surgeon: [name], MD;  Location: [facility];  Service: Orthopedics;  Laterality: Left;   Patient Active Problem List   Diagnosis Date Noted   Suicidal ideations 06/09/2022   Chills (without fever) 06/09/2022   Severe depression ([place]) 06/09/2022   Acute cough 06/09/2022   Reactive depression 05/31/2022   Belching 05/27/2022   Gastroesophageal reflux disease 05/27/2022   Atrial fibrillation ([place]) 05/20/2022   Long term (current) use of anticoagulants 05/20/2022   S/P transmetatarsal amputation of foot, left ([place]) 04/18/2022   CKD (chronic kidney disease) stage 5, GFR less than 15 ml/min ([place]) 04/18/2022   Secondary hyperparathyroidism of renal origin ([place]) 04/10/2022   Pruritus, unspecified 04/10/2022   Nausea 04/10/2022   Iron deficiency anemia, unspecified 04/10/2022   Hypothyroidism, unspecified 04/10/2022   Other acute kidney failure ([place]) 04/10/2022   Dyspnea, unspecified 05/39/7673   Eosinophilic esophagitis 41/93/7902   Gastric ulcer, unspecified as acute or chronic,  without hemorrhage or perforation 04/10/2022   Coagulation defect, unspecified ([place]) 04/10/2022   Allergy, unspecified, initial encounter 04/10/2022   Anaphylactic shock, unspecified, initial encounter 04/10/2022   Arthralgia of right lower leg    Acute blood loss anemia    Unilateral complete BKA, right, initial encounter ([place]) 04/03/2022   S/P BKA (below knee amputation), right ([place]) 04/03/2022   Old myocardial infarction 04/02/2022   Dependence on renal dialysis ([place]) 04/02/2022   Acquired absence of right leg below knee ([place]) 04/02/2022   Cirrhosis ([place]) 03/31/2022   Fournier's gangrene in male    Scrotal infection  03/25/2022   Normocytic anemia 03/25/2022   Diarrhea 03/25/2022   Shock liver 03/25/2022   Acute respiratory failure with hypoxia ([place]) 03/25/2022   Gout 03/25/2022   Acute nonintractable headache 03/25/2022   Demand ischemia ([place]) 03/25/2022   Thrombocytopenia ([place]) 03/25/2022   Obesity, Class III, BMI 40-49.9 (morbid obesity) ([place]) 03/25/2022   Cardiac arrest ([place]) 03/25/2022   Hypotension    Respiratory insufficiency    Cellulitis    Malnutrition of moderate degree 03/12/2022   Altered mental status    Gangrene of right foot ([place])    Gangrene of left foot ([place])    Severe protein-calorie malnutrition ([place]) 03/10/2022   Encounter for central line placement    Pressure injury of skin 02/23/2022   Septic shock ([place]) 02/22/2022   AKI (acute kidney injury) ([place])    Generalized abdominal pain    Lactic acidosis    Atrial fibrillation with RVR ([name])    Hypokalemia    Hypomagnesemia    Pain in finger of right hand 08/14/2021   Degenerative joint disease of shoulder region 05/07/2020   Olecranon bursitis of right elbow 05/07/2020   Pain in joint of right shoulder 04/26/2020   S/P TKR (total knee replacement), bilateral 11/04/2018   Bilateral primary osteoarthritis of knee    DDD (degenerative disc disease), cervical 01/10/2018   Rupture of medial head of gastrocnemius, left, sequela 01/14/2017   Kidney stones 06/22/2016   Essential hypertension 08/01/2010   DYSTHYMIC DISORDER 07/08/2010   PALPITATIONS 07/08/2010    REFERRING DIAG: Z89.511 (ICD-10-CM) - Hx of below knee amputation, right   ONSET DATE: 06/19/2022 Prosthesis delivery  THERAPY DIAG:  Other abnormalities of gait and mobility  Muscle weakness (generalized)  Unsteadiness on feet  Non-pressure chronic ulcer of skin of other sites with other specified severity ([name])  Edema, unspecified type  Foot drop, left  Rationale for Evaluation and Treatment Rehabilitation  PERTINENT HISTORY: Rt BKA 03/15/22, Left MTA  03/15/2022, bil. TKA 11/04/18, ESRD / chronic kidney disease st5 dialysis Tues/Thurs/Sat, cirrhosis, cardiac arrest May 23 post intubation, A-Fib  PRECAUTIONS: Fall  SUBJECTIVE:  He was not sore from longer walk last session.  He was able to get up from couch but hard and dining room chair slides back.    PAIN:  Are you having pain? No   OBJECTIVE: (objective measures completed at initial evaluation unless otherwise dated)  OBJECTIVE:    COGNITION: 06/22/2022  Overall cognitive status: Within functional limits for tasks assessed and pt had flat affect so unable to fully assess cognition.   POSTURE: 06/22/2022  rounded shoulders, forward head, flexed trunk , and weight shift left   LOWER EXTREMITY ROM:   ROM P:passive  A:active Right Eval 06/22/2022 Left Eval 06/22/2022  Hip flexion      Hip extension Standing A: ~30* limitation Standing A: ~30* limitation  Hip abduction      Hip  adduction      Hip internal rotation      Hip external rotation      Knee flexion      Knee extension Standing A: ~15* limitation Standing A: ~15* limitation  Ankle dorsiflexion   Standing A: ~25* limitation  Ankle plantarflexion      Ankle inversion      Ankle eversion       (Blank rows = not tested)   LOWER EXTREMITY MMT:   MMT Right Eval 06/22/2022 Left Eval 06/22/2022  Hip flexion 4/5 4/5  Hip extension 2/5 functional 2/5 functional  Hip abduction 2/5 functional 2/5 functional  Hip adduction      Hip internal rotation      Hip external rotation      Knee flexion      Knee extension 3-/5 functional 3-/5 functional  Ankle dorsiflexion 2-/5    Ankle plantarflexion      Ankle inversion      Ankle eversion      (Blank rows = not tested)   TRANSFERS: 06/22/2022  Sit to stand: Mod A and from 22" w/c requiring armrests to push & RW to stabilize 06/22/2022  Stand to sit: Min A from RW to 22" w/c unable to release RW to reach for w/c until seated.   GAIT: 06/22/2022 Gait pattern: step  to pattern, decreased step length- Left, decreased stance time- Right, decreased stride length, decreased hip/knee flexion- Right, circumduction- Right, Right hip hike, knee flexed in stance- Right, knee flexed in stance- Left, antalgic, trunk flexed, abducted- Right, and poor foot clearance- Left excessive weightbearing BUEs on RW Distance walked: 5' Assistive device utilized: Environmental consultant - 2 wheeled and right TTA prosthesis / left plate & shoe filler Level of assistance: Mod A Comments: post gait HR 82, SpO2 99% with dyspnea noted   FUNCTIONAL TESTs:  06/22/2022  Static standing balance with BUE support on RW for 1 min with minA.     CURRENT PROSTHETIC WEAR ASSESSMENT: 06/22/2022 Patient is dependent with: skin check, residual limb care, care of non-amputated limb, prosthetic cleaning, ply sock cleaning, correct ply sock adjustment, proper wear schedule/adjustment, and proper weight-bearing schedule/adjustment Donning prosthesis: Total A and max cues required, Doffing prosthesis: Total A and wife able to doffe Prosthetic wear tolerance: 1-2 hours 1-2x/day, for 3 of 3 days since delivery.  Prosthetic weight bearing tolerance: 0 minutes unable to stand outside of PT Edema: pitting with >5sec capillary refill Residual limb condition: 20m wound with serous drainage & black scab present,  calf measurement is 42.2cm Prosthetic description:  silicon liner with pin suspension, flexible inner socket Total Contact design, dynamic response foot with hydraulic PF/DF K code/activity level with prosthetic use: Level 3     TODAY'S TREATMENT:  07/08/2022 Prosthetic Care with right TTA prosthesis & left TMA shoe filler with carbon plate: No skin changes except wound scab is no longer over moist. PT recommended to continue underliner but can trim upper band if pressure over tibial plateau is an issue. Continue wear all awake hours except 2 hours mid-day.  Pt & wife verbalized understanding.  PT recommended  working on walking in home with RW with wife supervision to move from one area to another and not sitting in w/c.  Begin to exit & enter home & community walking with RW and wife supervision / following with w/c until he fatigues. Pt & wife verbalized understanding.   Therapeutic Activities: PT demo & verbal cues on sit to/from stand chairs without armrests  not stabilized. Pt able to stand from 18" chair without armrests to RW 2 reps with minA / tactile cues to shift weight over feet.  Stand to sit with verbal cues for technique improved control 2nd rep. Sit to / from stand from 24" bar stool with hands on thighs and goal to not touch //bars to stabilize, 5 reps with minA, 1 rep 3 sets with minA during remainder of session.  Neuromuscular Re-education: Standing posture using door frame behind RW ant - reaching overhead single UE 2 reps ea and BUEs 2 reps with hold 2 deep breaths. PT recommended 3+ times / day when passing thru to work on posture. Pt & wife verbalized understanding. Standing balance on floor eyes open head movements right/left, up/down & 2 diagonals using mirror for upright posture bw ea motion for 5 reps ea direction with minA. Static stance on floor eyes closed 3 reps up to 20 sec with min guard. Static stance on foam eyes open 3 reps up to 11 sec with min guard.  07/06/2022 Prosthetic Care with right TTA prosthesis & left TMA shoe filler with carbon plate: Pt reports prosthesis feels like it is moving up with weight bearing & pulling down with swing.  PT assessed in standing with <1/4" movement in patella relief between weight bearing & open chain which is probably soft tissue normal movement. Wound is not changing in size but scab is white indicating excessive moisture.  PT instructed in use of Vivewear underliner for wound healing.  PT recommended switching underliner between 1st & 2nd wears. PT instructed in cleaning & drying nightly. Pt & wife verbalized understanding. PT  recommended wearing prosthesis from getting out of bath in mornings until bedtime except 2 hours midday. Wearing prosthesis to dialysis and removing for 2 hour break when he gets home. Pt verbalized understanding of subtracting prosthesis weight of 2.3kg from weigh-in & weigh-out at dialysis. Pt & wife verbalized understanding.   Pt ambulated 72' with RW with supervision with visual & verbal cues for step width and upright posture. PT verbal & demo cues on neg ramp & curb. Pt neg with RW with supervision.  Pt ambulated 205' from clinic to car with RW with supervision. He reports furthest that he has walked since amputation. PT recommended sitting on all furniture at home to make sure that he can arise. Then not using w/c for sitting in home unless fatigued like after dialysis.  PT recommended ambulating with wife's supervision to change locations in home. PT demo how to follow with w/c & supervise for long distance. PT recommended walking max tolerable distance 1-2 x/day with assumption that dialysis days will be less than non-dialysis days. Pt & wife verbalized understanding.  07/01/2022 Prosthetic Care with right TTA prosthesis & left TMA shoe filler with carbon plate: Pt arrived with ~1/2" gap at bottom of liner.  PT reviewed donning technique with issues if distal liner not in direct contact.  Pt able to donne with verbal cues & light touch on distal liner. PT recommended increasing wear to 6hrs 2x/day with 2 hours off bw wears but not to dialysis yet and patting limb/liner dry ~ midpoint of 6 hours. PT recommended wearing prosthesis even on days that he does not feel well but decreasing activity level accordingly.  Pt & wife verbalized understanding of all instructions. PT instructed in adjusting ply socks with handout, verbal & tactile cues. Donning & ambulating with too few, too many & correct ply socks.  Pt amb 20'  X 3 with RW with CGA. PT demo & verbal cues on proper step width for stance limb  weight bearing over limb & improve hip stability. Pt ambulated 10' with RW with visual cues on RW with supervision & verbal cues. PT demo & verbal cues on car transfer with RW; pt return demo & verbalized understanding with CGA.     HOME EXERCISE PROGRAM:  AT [facility] FIND YOUR MIDLINE POSITION AND PLACE FEET EQUAL DISTANCE FROM THE MIDLINE.  Try to find this position when standing still for activities.   USE TAPE ON FLOOR TO MARK THE MIDLINE POSITION which is even with middle of sink.  You also should try to feel with your limb pressure in socket.  You are trying to feel with limb what you used to feel with the bottom of your foot.  Side to Side Shift: Moving your hips only (not shoulders): move weight onto your left leg, HOLD/FEEL pressure in socket.  Move back to equal weight on each leg, HOLD/FEEL pressure in socket. Move weight onto your right leg, HOLD/FEEL pressure in socket. Move back to equal weight on each leg, HOLD/FEEL pressure in socket. Repeat.  Start with both hands on sink, progress to hand on prosthetic side only, then no hands.  Front to Back Shift: Moving your hips only (not shoulders): move your weight forward onto your toes, HOLD/FEEL pressure in socket. Move your weight back to equal Flat Foot on both legs, HOLD/FEEL  pressure in socket. Move your weight back onto your heels, HOLD/FEEL  pressure in socket. Move your weight back to equal on both legs, HOLD/FEEL  pressure in socket. Repeat.  Start with both hands on sink, progress to hand on prosthetic side only, then no hands.  Moving Cones / Cups: With equal weight on each leg: Hold on with one hand the first time, then progress to no hand supports. Move cups from one side of sink to the other. Place cups ~2" out of your reach, progress to 10" beyond reach.  Place one hand in middle of sink and reach with other hand. Do both arms.  Then hover one hand and move cups with other hand.  Overhead/Upward Reaching: alternated reaching up to  top cabinets or ceiling if no cabinets present. Keep equal weight on each leg. Start with one hand support on counter while other hand reaches and progress to no hand support with reaching.  ace one hand in middle of sink and reach with other hand. Do both arms.  Then hover one hand and move cups with other hand.  5.   Looking Over Shoulders: With equal weight on each leg: alternate turning to look over your shoulders with one hand support on counter as needed.  Start with head motions only to look in front of shoulder, then even with shoulder and progress to looking behind you. To look to side, move head /eyes, then shoulder on side looking pulls back, shift more weight to side looking and pull hip back. Place one hand in middle of sink and let go with other hand so your shoulder can pull back. Switch hands to look other way.   Then hover one hand and look over shoulder. If looking right, use left hand at sink. If looking left, use right hand at sink. 6.  Stepping with leg that is not amputated:  Move items under cabinet out of your way. Shift your hips/pelvis so weight on prosthesis. Tighten muscles in hip on prosthetic side.  SLOWLY  step other leg so front of foot is in cabinet. Then step back to floor.   Pt also recommending sitting at edge of w/c with feet supported on floor but no back support each hour in w/c until back fatigues. PT also demo & instructed in sitting on 24" stool. Pt & wife verbalized understanding.     ASSESSMENT:   CLINICAL IMPRESSION: Patient is slowly improving his prosthetic gait and seems to understand PT recommendations for increasing activity tolerance. He improved his ability to stand / sit from chairs with armrests.  PT worked on standing posture and balance which improved with instruction & repetition.    OBJECTIVE IMPAIRMENTS Abnormal gait, cardiopulmonary status limiting activity, decreased activity tolerance, decreased balance, decreased endurance, decreased knowledge  of condition, decreased knowledge of use of DME, decreased mobility, difficulty walking, decreased ROM, decreased strength, increased edema, impaired flexibility, postural dysfunction, and prosthetic dependency .    ACTIVITY LIMITATIONS carrying, lifting, bending, sitting, standing, squatting, stairs, transfers, locomotion level, and prosthesis use   PARTICIPATION LIMITATIONS: driving, community activity, occupation, yard work, and recreational of camping   [place], Past/current experiences, Time since onset of injury/illness/exacerbation, and 3+ comorbidities: see PMH  are also affecting patient's functional outcome.    REHAB POTENTIAL: Good   CLINICAL DECISION MAKING: Evolving/moderate complexity   EVALUATION COMPLEXITY: Moderate     GOALS: Goals reviewed with patient? Yes   SHORT TERM GOALS: Target date: 07/22/2022   Patient donnes prosthesis modified independent & verbalizes proper cleaning. Baseline: SEE OBJECTIVE DATA Goal status: INITIAL 2.  Patient tolerates prosthesis >10 hrs total /day without increase in skin issues & no limb pain. Baseline: SEE OBJECTIVE DATA Goal status: INITIAL   3.  Patient able to reach 5" and look over both shoulders with RW support with supervision. Baseline: SEE OBJECTIVE DATA Goal status: INITIAL   4. Patient ambulates 50' with RW & prosthesis with minA. Baseline: SEE OBJECTIVE DATA Goal status: INITIAL       LONG TERM GOALS: Target date: 08/27/2022   Patient demonstrates & verbalized understanding of prosthetic care to enable safe utilization of prosthesis. Baseline: SEE OBJECTIVE DATA Goal status: INITIAL   Patient tolerates prosthesis wear >90% of awake hours without skin or limb pain issues. Baseline: SEE OBJECTIVE DATA Goal status: INITIAL   Berg Balance >/= 36/56 to indicate lower fall risk Baseline: SEE OBJECTIVE DATA Goal status: INITIAL   Patient ambulates >300' with prosthesis & LRAD  independently Baseline: SEE OBJECTIVE DATA Goal status: INITIAL   Patient negotiates ramps, curbs & stairs with single rail with prosthesis & LRAD independently. Baseline: SEE OBJECTIVE DATA Goal status: INITIAL     PLAN: PT FREQUENCY: 2x/week   PT DURATION: 10 weeks   PLANNED INTERVENTIONS: Therapeutic exercises, Therapeutic activity, Neuromuscular re-education, Balance training, Gait training, Patient/Family education, Self Care, Stair training, Vestibular training, Canalith repositioning, Prosthetic training, DME instructions, Vasopneumatic device, and physical performance testing   PLAN FOR NEXT SESSION: check wound & progress wear to all awake except 1 hour mid-day if no issues noted.  continue to progress Prosthetic gait with RW including ramps & curbs. standing balance.    Jamey Reas, PT, DPT 07/08/2022, 10:36 AM

## 2022-07-09 DIAGNOSIS — N186 End stage renal disease: Secondary | ICD-10-CM | POA: Diagnosis not present

## 2022-07-09 DIAGNOSIS — N2581 Secondary hyperparathyroidism of renal origin: Secondary | ICD-10-CM | POA: Diagnosis not present

## 2022-07-09 DIAGNOSIS — Z992 Dependence on renal dialysis: Secondary | ICD-10-CM | POA: Diagnosis not present

## 2022-07-09 DIAGNOSIS — Z23 Encounter for immunization: Secondary | ICD-10-CM | POA: Diagnosis not present

## 2022-07-10 ENCOUNTER — Other Ambulatory Visit: Payer: Self-pay | Admitting: Physical Medicine & Rehabilitation

## 2022-07-10 ENCOUNTER — Other Ambulatory Visit: Payer: Self-pay

## 2022-07-10 ENCOUNTER — Inpatient Hospital Stay: Payer: BC Managed Care – PPO | Attending: Oncology | Admitting: Oncology

## 2022-07-10 VITALS — BP 129/71 | HR 102 | Temp 98.1°F | Resp 15

## 2022-07-10 DIAGNOSIS — D509 Iron deficiency anemia, unspecified: Secondary | ICD-10-CM

## 2022-07-10 DIAGNOSIS — D649 Anemia, unspecified: Secondary | ICD-10-CM | POA: Diagnosis not present

## 2022-07-10 NOTE — Progress Notes (Signed)
Reason for the request:    Anemia  HPI: I was asked by Dr. Marval Regal to evaluate Jeffrey Ayala for the evaluation of anemia.  Jeffrey Ayala is a 61 year old man hospitalized on February 22, 2022 after presenting with with acute onset atrial fibrillation as well as presumed sepsis.  Jeffrey Ayala had a prolonged hospitalization all the way till the end of May resulted in an amputation right below the knee as well as renal failure requiring hemodialysis.  Upon presentation Jeffrey Ayala had a normal CBC with a hemoglobin of 13 but subsequently developed need for packed red cell transfusion and received multiple units.  Since his discharge, Jeffrey Ayala has been receiving hemodialysis and continues to have a hemoglobin close to 7 despite erythropoietin supplementation.  Laboratory data on June 13, 2022 showed a hemoglobin of 6.9 iron of 54 and MCV of 97.  His ferritin at that time was over 900.  Jeffrey Ayala received iron sucrose at 50 mg.  His warfarin has been discontinued and no GI bleeding detected.  Jeffrey Ayala denies any excessive fatigue or tiredness or palpitation.  Jeffrey Ayala denies shortness of breath.   Jeffrey Ayala does not report any headaches, blurry vision, syncope or seizures. Does not report any fevers, chills or sweats.  Does not report any cough, wheezing or hemoptysis.  Does not report any chest pain, palpitation, orthopnea or leg edema.  Does not report any nausea, vomiting or abdominal pain.  Does not report any constipation or diarrhea.  Does not report any skeletal complaints.    Does not report frequency, urgency or hematuria.  Does not report any skin rashes or lesions. Does not report any heat or cold intolerance.  Does not report any lymphadenopathy or petechiae.  Does not report any anxiety or depression.  Remaining review of systems is negative.     Past Medical History:  Diagnosis Date   Concussion    Eosinophilic esophagitis    EGD/bx by Dr. Havery Moros   Gastric ulcer    History of kidney stones    Intermittent dysphagia    Migraine   :   Past  Surgical History:  Procedure Laterality Date   AMPUTATION Right 03/15/2022   Procedure: RIGHT BELOW KNEE AMPUTATION;  Surgeon: [name], MD;  Location: [facility];  Service: Orthopedics;  Laterality: Right;   CYSTOSCOPY     DENTAL SURGERY     FINGER SURGERY     HERNIA REPAIR     at age 51   IR FLUORO GUIDE CV LINE RIGHT  03/09/2022   IR PATIENT EVAL TECH 0-60 MINS  03/09/2022   IR US GUIDE VASC ACCESS RIGHT  03/09/2022   KNEE ARTHROSCOPY Left    LITHOTRIPSY     NEPHROLITHOTOMY     SCROTAL EXPLORATION N/A 03/16/2022   Procedure: IRRIGATION AND DEBRIDEMENT SCROTUM;  Surgeon: Raynelle Bring, MD;  Location: [facility] [facility];  Service: Urology;  Laterality: N/A;   SCROTAL EXPLORATION N/A 03/28/2022   Procedure: IRRIGATION AND DEBRIDEMENT SCROTUM;  Surgeon: Ardis Hughs, MD;  Location: [place];  Service: Urology;  Laterality: N/A;   TONSILLECTOMY     TOTAL KNEE ARTHROPLASTY Bilateral 11/04/2018   Procedure: BILATERAL TOTAL KNEE ARTHROPLASTY;  Surgeon: [name], MD;  Location: [place];  Service: Orthopedics;  Laterality: Bilateral;  spinal/epidural per anesthesiologist   TRANSMETATARSAL AMPUTATION Left 03/15/2022   Procedure: LEFT TRANSMETATARSAL AMPUTATION;  Surgeon: [name], MD;  Location: [facility];  Service: Orthopedics;  Laterality: Left;  :   Current Outpatient Medications:    acetaminophen (  TYLENOL) 650 MG CR tablet, Take 650 mg by mouth every 8 (eight) hours as needed for pain or fever., Disp: , Rfl:    allopurinol (ZYLOPRIM) 100 MG tablet, Take 1 tablet (100 mg total) by mouth daily., Disp: 30 tablet, Rfl: 3   escitalopram (LEXAPRO) 10 MG tablet, TAKE 1 TABLET BY MOUTH EVERY DAY, Disp: 90 tablet, Rfl: 1   lanthanum (FOSRENOL) 1000 MG chewable tablet, Chew 1 tablet (1,000 mg total) by mouth 3 (three) times daily with meals., Disp: 90 tablet, Rfl: 0   metoprolol tartrate (LOPRESSOR) 25 MG tablet, Take 0.5 tablets (12.5 mg total) by mouth 2 (two) times daily., Disp: 90 tablet, Rfl: 3    multivitamin (RENA-VIT) TABS tablet, Take 1 tablet by mouth at bedtime., Disp: 30 tablet, Rfl: 0   pantoprazole (PROTONIX) 40 MG tablet, Take 1 tablet (40 mg total) by mouth 2 (two) times daily., Disp: 180 tablet, Rfl: 3   simethicone (MYLICON) 80 MG chewable tablet, Chew 1 tablet (80 mg total) by mouth 4 (four) times daily as needed for flatulence., Disp: 30 tablet, Rfl: 0   traZODone (DESYREL) 50 MG tablet, Take 0.5-1 tablets (25-50 mg total) by mouth at bedtime as needed for sleep., Disp: 30 tablet, Rfl: 2   warfarin (COUMADIN) 5 MG tablet, TAKE 1 TABLET (5 MG TOTAL) BY MOUTH DAILY., Disp: 30 tablet, Rfl: 0:   Allergies  Allergen Reactions   Codeine Hives    Insomnia, jittery   Flomax [Tamsulosin Hcl] Nausea Only    Nausea and fatigue  :   Family History  Problem Relation Age of Onset   Arrhythmia Mother    COPD Mother    Sarcoidosis Brother    Cancer Brother    Stomach cancer Neg Hx    Pancreatic cancer Neg Hx    Esophageal cancer Neg Hx    Colon cancer Neg Hx    Rectal cancer Neg Hx   :   Social History   Socioeconomic History   Marital status: Married    Spouse name: Not on file   Number of children: 2   Years of education: Not on file   Highest education level: Not on file  Occupational History   Occupation: Press photographer - Clinical cytogeneticist  Tobacco Use   Smoking status: Never   Smokeless tobacco: Never  Vaping Use   Vaping Use: Never used  Substance and Sexual Activity   Alcohol use: Not Currently    Comment: occassional   Drug use: No   Sexual activity: Not Currently  Other Topics Concern   Not on file  Social History Narrative   Not on file   Social Determinants of Health   Financial Resource Strain: Not on file  Food Insecurity: Not on file  Transportation Needs: Not on file  Physical Activity: Not on file  Stress: Not on file  Social Connections: Not on file  Intimate Partner Violence: Not on file  :  Pertinent items are noted in  HPI.  Exam: Blood pressure 129/71, pulse (!) 102, temperature 98.1 F (36.7 C), temperature source Temporal, resp. rate 15, SpO2 100 %. General appearance: alert and cooperative appeared without distress. Head: atraumatic without any abnormalities. Eyes: conjunctivae/corneas clear. PERRL.  Sclera anicteric. Throat: lips, mucosa, and tongue normal; without oral thrush or ulcers. Resp: clear to auscultation bilaterally without rhonchi, wheezes or dullness to percussion. Cardio: Regular without any murmurs.  No edema noted in his left lower extremity. GI: soft, non-tender; bowel sounds normal; no masses,  no organomegaly Skin: Skin color, texture, turgor normal. No rashes or lesions Lymph nodes: Cervical, supraclavicular, and axillary nodes normal. Neurologic: Grossly normal without any motor, sensory or deep tendon reflexes. Musculoskeletal: Right below the knee amputation noted.    Assessment and Plan:   61 year old with:  1.  Anemia normocytic, normochromic with normal iron studies as well as normal white cell count and platelet count.  His hemoglobin was 7.4 on July 18 and was low as 6.6 upon discharge on May 2023.  Jeffrey Ayala is currently receiving erythropoietin supplementation as part of his dialysis.   The differential diagnosis of these findings were discussed at this time.  I do not see any evidence to suggest a hematological disorder.  Jeffrey Ayala has normal CBC when Jeffrey Ayala presented initially in April 2023 and his white cell count and platelets continue to be within normal range.  His anemia appears to be resistant to erythropoietin replacement time.  From a management standpoint, I agree with GI work-up to rule out ongoing GI bleeding which I think is less likely.  It is possible that Jeffrey Ayala has developed myelosuppression related due to his acute hospitalization and infection and which might take time for full bone marrow and erythropoiesis recovery.  In the meantime I recommend continue erythropoietin  with increased dosing.  Doubling the dose of his current erythropoietin administration would be reasonable at this time after ruling out GI bleeding.  Packed red cell transfusion can be also used in the interim for any surgical procedure planned in the near future.  I see no need or indication for bone marrow biopsy at this time given the likelihood of a infiltrative bone marrow disease is very low.  2.  Follow-up: I am happy to see him in the future as needed.  45  minutes were dedicated to this visit. The time was spent on reviewing laboratory data, discussing treatment options, discussing differential diagnosis and answering questions regarding future plan.     A copy of this consult has been forwarded to the requesting physician.

## 2022-07-11 DIAGNOSIS — Z992 Dependence on renal dialysis: Secondary | ICD-10-CM | POA: Diagnosis not present

## 2022-07-11 DIAGNOSIS — N2581 Secondary hyperparathyroidism of renal origin: Secondary | ICD-10-CM | POA: Diagnosis not present

## 2022-07-11 DIAGNOSIS — Z23 Encounter for immunization: Secondary | ICD-10-CM | POA: Diagnosis not present

## 2022-07-11 DIAGNOSIS — N186 End stage renal disease: Secondary | ICD-10-CM | POA: Diagnosis not present

## 2022-07-13 ENCOUNTER — Encounter: Payer: Self-pay | Admitting: Orthopedic Surgery

## 2022-07-13 ENCOUNTER — Ambulatory Visit (INDEPENDENT_AMBULATORY_CARE_PROVIDER_SITE_OTHER): Payer: BC Managed Care – PPO | Admitting: Physical Therapy

## 2022-07-13 ENCOUNTER — Encounter: Payer: Self-pay | Admitting: Physical Therapy

## 2022-07-13 ENCOUNTER — Ambulatory Visit (INDEPENDENT_AMBULATORY_CARE_PROVIDER_SITE_OTHER): Payer: BC Managed Care – PPO | Admitting: Orthopedic Surgery

## 2022-07-13 DIAGNOSIS — M21372 Foot drop, left foot: Secondary | ICD-10-CM

## 2022-07-13 DIAGNOSIS — Z89511 Acquired absence of right leg below knee: Secondary | ICD-10-CM

## 2022-07-13 DIAGNOSIS — R609 Edema, unspecified: Secondary | ICD-10-CM

## 2022-07-13 DIAGNOSIS — Z794 Long term (current) use of insulin: Secondary | ICD-10-CM | POA: Diagnosis not present

## 2022-07-13 DIAGNOSIS — E11610 Type 2 diabetes mellitus with diabetic neuropathic arthropathy: Secondary | ICD-10-CM | POA: Diagnosis not present

## 2022-07-13 DIAGNOSIS — M6281 Muscle weakness (generalized): Secondary | ICD-10-CM

## 2022-07-13 DIAGNOSIS — R2689 Other abnormalities of gait and mobility: Secondary | ICD-10-CM | POA: Diagnosis not present

## 2022-07-13 DIAGNOSIS — L98498 Non-pressure chronic ulcer of skin of other sites with other specified severity: Secondary | ICD-10-CM

## 2022-07-13 DIAGNOSIS — R2681 Unsteadiness on feet: Secondary | ICD-10-CM | POA: Diagnosis not present

## 2022-07-13 DIAGNOSIS — Z89432 Acquired absence of left foot: Secondary | ICD-10-CM

## 2022-07-13 NOTE — Progress Notes (Signed)
Office Visit Note   Patient: Jeffrey Ayala           Date of Birth: June 16, 1961           MRN: [id number] Visit Date: 07/13/2022              Requested by: Jerline Pain, MD [phone] [address] [place],  [place] [address] PCP: [name], NP-C  Chief Complaint  Patient presents with   Left Foot - Follow-up    03/15/22 Left transmet amputation   Right Leg - Follow-up    03/15/22 right BKA      HPI: Patient is a 61 year old gentleman status post left transmetatarsal amputation and right transtibial amputation.  Patient is working with Museum/gallery curator for his prosthetic fitting and transmetatarsal spacer.  He is currently in physical therapy.  Patient complains of neuropathy pain in the plantar aspect of the left foot.  Assessment & Plan: Visit Diagnoses:  1. Hx of below knee amputation, right ([place])   2. History of transmetatarsal amputation of left foot ([place])   3. Type 2 diabetes mellitus with diabetic neuropathic arthropathy, with long-term current use of insulin ([name])     Plan: Recommend patient try the Neurontin 300 mg at dinnertime and see if this resolves enough symptoms.  Discussed that if this does not resolve his symptoms would then try Neurontin twice a day.  Discussed the most common side effect of drowsiness.  Recommended patient use the prosthetic under liners daily.  Follow-Up Instructions: Return if symptoms worsen or fail to improve.   Ortho Exam  Patient is alert, oriented, no adenopathy, well-dressed, normal affect, normal respiratory effort. Examination left foot there is no swelling no cellulitis no open ulcers no signs of infection patient does have increased sensitivity to palpation of the plantar aspect of the foot.  He has good hair growth.  Examination the right transtibial amputation he has full range of motion the ulcers are resolving after wearing the prosthetic under liner.  Imaging: No results found.    Labs: Lab Results  Component  Value Date   HGBA1C 5.5 02/23/2022   ESRSEDRATE 20 (H) 02/22/2022   CRP 13.8 (H) 02/23/2022   LABURIC 8.5 (H) 05/27/2022   LABURIC 6.3 11/14/2018   LABURIC 7.4 04/10/2014   REPTSTATUS 04/05/2022 FINAL 03/16/2022   GRAMSTAIN  03/16/2022    NO WBC SEEN ABUNDANT GRAM POSITIVE COCCI FEW GRAM NEGATIVE RODS    CULT  03/16/2022    ABUNDANT ENTEROCOCCUS FAECALIS FEW PSEUDOMONAS AERUGINOSA MULTI-DRUG RESISTANT ORGANISM ABUNDANT BACTEROIDES OVATUS BETA LACTAMASE POSITIVE CRITICAL RESULT CALLED TO, READ BACK BY AND VERIFIED WITH: PHARMD K PIERCE [id number] AT 1019 BY CM Sent to [facility] for further susceptibility testing. SEE REPORT IN EPIC Performed at [facility], [place] [address] [place], [place] [address]    LABORGA ENTEROCOCCUS FAECALIS 03/16/2022   LABORGA PSEUDOMONAS AERUGINOSA 03/16/2022     Lab Results  Component Value Date   ALBUMIN 4.0 06/09/2022   ALBUMIN 4.2 05/27/2022   ALBUMIN 3.6 04/18/2022    Lab Results  Component Value Date   MG 1.9 04/18/2022   MG 2.1 04/01/2022   MG 2.0 03/31/2022   No results found for: "VD25OH"  No results found for: "PREALBUMIN"    Latest Ref Rng & Units 06/09/2022   12:07 PM 04/27/2022    2:25 PM 04/20/2022    5:01 AM  CBC EXTENDED  WBC 4.0 - 10.5 K/uL 10.4  7.7  7.3  RBC 4.22 - 5.81 Mil/uL 2.29  2.69  2.52   Hemoglobin 13.0 - 17.0 g/dL 7.4 Repeated and verified X2.  8.3  7.9   HCT 39.0 - 52.0 % 21.6 Repeated and verified X2.  25.2  24.0   Platelets 150.0 - 400.0 K/uL 240.0  281  216   NEUT# 1.4 - 7.7 K/uL 8.4     Lymph# 0.7 - 4.0 K/uL 1.1        There is no height or weight on file to calculate BMI.  Orders:  No orders of the defined types were placed in this encounter.  No orders of the defined types were placed in this encounter.    Procedures: No procedures performed  Clinical Data: No additional findings.  ROS:  All other systems negative, except as noted in the HPI. Review of Systems  Objective: Vital  Signs: There were no vitals taken for this visit.  Specialty Comments:  No specialty comments available.  PMFS History: Patient Active Problem List   Diagnosis Date Noted   Suicidal ideations 06/09/2022   Chills (without fever) 06/09/2022   Severe depression ([place]) 06/09/2022   Acute cough 06/09/2022   Reactive depression 05/31/2022   Belching 05/27/2022   Gastroesophageal reflux disease 05/27/2022   Atrial fibrillation ([place]) 05/20/2022   Long term (current) use of anticoagulants 05/20/2022   S/P transmetatarsal amputation of foot, left ([place]) 04/18/2022   CKD (chronic kidney disease) stage 5, GFR less than 15 ml/min ([place]) 04/18/2022   Secondary hyperparathyroidism of renal origin ([place]) 04/10/2022   Pruritus, unspecified 04/10/2022   Nausea 04/10/2022   Iron deficiency anemia, unspecified 04/10/2022   Hypothyroidism, unspecified 04/10/2022   Other acute kidney failure ([place]) 04/10/2022   Dyspnea, unspecified 99/37/1696   Eosinophilic esophagitis 78/93/8101   Gastric ulcer, unspecified as acute or chronic, without hemorrhage or perforation 04/10/2022   Coagulation defect, unspecified ([place]) 04/10/2022   Allergy, unspecified, initial encounter 04/10/2022   Anaphylactic shock, unspecified, initial encounter 04/10/2022   Arthralgia of right lower leg    Acute blood loss anemia    Unilateral complete BKA, right, initial encounter ([place]) 04/03/2022   S/P BKA (below knee amputation), right ([place]) 04/03/2022   Old myocardial infarction 04/02/2022   Dependence on renal dialysis ([place]) 04/02/2022   Acquired absence of right leg below knee ([place]) 04/02/2022   Cirrhosis ([place]) 03/31/2022   Fournier's gangrene in male    Scrotal infection 03/25/2022   Normocytic anemia 03/25/2022   Diarrhea 03/25/2022   Shock liver 03/25/2022   Acute respiratory failure with hypoxia ([place]) 03/25/2022   Gout 03/25/2022   Acute nonintractable headache 03/25/2022   Demand ischemia ([place]) 03/25/2022    Thrombocytopenia ([place]) 03/25/2022   Obesity, Class III, BMI 40-49.9 (morbid obesity) ([place]) 03/25/2022   Cardiac arrest ([place]) 03/25/2022   Hypotension    Respiratory insufficiency    Cellulitis    Malnutrition of moderate degree 03/12/2022   Altered mental status    Gangrene of right foot ([place])    Gangrene of left foot ([place])    Severe protein-calorie malnutrition ([place]) 03/10/2022   Encounter for central line placement    Pressure injury of skin 02/23/2022   Septic shock ([place]) 02/22/2022   AKI (acute kidney injury) ([place])    Generalized abdominal pain    Lactic acidosis    Atrial fibrillation with RVR ([name])    Hypokalemia    Hypomagnesemia    Pain in finger of right hand 08/14/2021   Degenerative joint disease of shoulder region  05/07/2020   Olecranon bursitis of right elbow 05/07/2020   Pain in joint of right shoulder 04/26/2020   S/P TKR (total knee replacement), bilateral 11/04/2018   Bilateral primary osteoarthritis of knee    DDD (degenerative disc disease), cervical 01/10/2018   Rupture of medial head of gastrocnemius, left, sequela 01/14/2017   Kidney stones 06/22/2016   Essential hypertension 08/01/2010   DYSTHYMIC DISORDER 07/08/2010   PALPITATIONS 07/08/2010   Past Medical History:  Diagnosis Date   Concussion    Eosinophilic esophagitis    EGD/bx by Dr. Havery Moros   Gastric ulcer    History of kidney stones    Intermittent dysphagia    Migraine     Family History  Problem Relation Age of Onset   Arrhythmia Mother    COPD Mother    Sarcoidosis Brother    Cancer Brother    Stomach cancer Neg Hx    Pancreatic cancer Neg Hx    Esophageal cancer Neg Hx    Colon cancer Neg Hx    Rectal cancer Neg Hx     Past Surgical History:  Procedure Laterality Date   AMPUTATION Right 03/15/2022   Procedure: RIGHT BELOW KNEE AMPUTATION;  Surgeon: [name], MD;  Location: [facility];  Service: Orthopedics;  Laterality: Right;   CYSTOSCOPY     DENTAL SURGERY     FINGER  SURGERY     HERNIA REPAIR     at age 40   IR FLUORO GUIDE CV LINE RIGHT  03/09/2022   IR PATIENT EVAL TECH 0-60 MINS  03/09/2022   IR US GUIDE VASC ACCESS RIGHT  03/09/2022   KNEE ARTHROSCOPY Left    LITHOTRIPSY     NEPHROLITHOTOMY     SCROTAL EXPLORATION N/A 03/16/2022   Procedure: IRRIGATION AND DEBRIDEMENT SCROTUM;  Surgeon: Raynelle Bring, MD;  Location: [facility];  Service: Urology;  Laterality: N/A;   SCROTAL EXPLORATION N/A 03/28/2022   Procedure: IRRIGATION AND DEBRIDEMENT SCROTUM;  Surgeon: Ardis Hughs, MD;  Location: [facility];  Service: Urology;  Laterality: N/A;   TONSILLECTOMY     TOTAL KNEE ARTHROPLASTY Bilateral 11/04/2018   Procedure: BILATERAL TOTAL KNEE ARTHROPLASTY;  Surgeon: [name], MD;  Location: [facility];  Service: Orthopedics;  Laterality: Bilateral;  spinal/epidural per anesthesiologist   TRANSMETATARSAL AMPUTATION Left 03/15/2022   Procedure: LEFT TRANSMETATARSAL AMPUTATION;  Surgeon: [name], MD;  Location: [facility];  Service: Orthopedics;  Laterality: Left;   Social History   Occupational History   Occupation: Airline pilot  Tobacco Use   Smoking status: Never   Smokeless tobacco: Never  Vaping Use   Vaping Use: Never used  Substance and Sexual Activity   Alcohol use: Not Currently    Comment: occassional   Drug use: No   Sexual activity: Not Currently

## 2022-07-13 NOTE — Therapy (Signed)
OUTPATIENT PHYSICAL THERAPY TREATMENT NOTE   Patient Name: Jeffrey Ayala MRN: [id number] DOB:1961/01/21, 61 y.o., male Today's Date: 07/13/2022  PCP: [name], NP-C REFERRING PROVIDER: [name], MD  END OF SESSION:   PT End of Session - 07/13/22 1149     Visit Number 7    Number of Visits 20    Authorization Type [employer] comm PPO    Authorization Time Period 20 visit limit PT/OT/chiro, $35 co-pay    Authorization - Visit Number 7    Authorization - Number of Visits 20    Progress Note Due on Visit 10    PT Start Time 1145    PT Stop Time 1229    PT Time Calculation (min) 44 min    Equipment Utilized During Treatment Gait belt    Activity Tolerance Patient tolerated treatment well;Patient limited by fatigue    Behavior During Therapy WFL for tasks assessed/performed;Flat affect                  Past Medical History:  Diagnosis Date   Concussion    Eosinophilic esophagitis    EGD/bx by Dr. Havery Moros   Gastric ulcer    History of kidney stones    Intermittent dysphagia    Migraine    Past Surgical History:  Procedure Laterality Date   AMPUTATION Right 03/15/2022   Procedure: RIGHT BELOW KNEE AMPUTATION;  Surgeon: [name], MD;  Location: [address];  Service: Orthopedics;  Laterality: Right;   CYSTOSCOPY     DENTAL SURGERY     FINGER SURGERY     HERNIA REPAIR     at age 59   IR FLUORO GUIDE CV LINE RIGHT  03/09/2022   IR PATIENT EVAL TECH 0-60 MINS  03/09/2022   IR US GUIDE VASC ACCESS RIGHT  03/09/2022   KNEE ARTHROSCOPY Left    LITHOTRIPSY     NEPHROLITHOTOMY     SCROTAL EXPLORATION N/A 03/16/2022   Procedure: IRRIGATION AND DEBRIDEMENT SCROTUM;  Surgeon: Raynelle Bring, MD;  Location: [place];  Service: Urology;  Laterality: N/A;   SCROTAL EXPLORATION N/A 03/28/2022   Procedure: IRRIGATION AND DEBRIDEMENT SCROTUM;  Surgeon: Ardis Hughs, MD;  Location: [place];  Service: Urology;  Laterality: N/A;   TONSILLECTOMY     TOTAL KNEE  ARTHROPLASTY Bilateral 11/04/2018   Procedure: BILATERAL TOTAL KNEE ARTHROPLASTY;  Surgeon: [name], MD;  Location: [place];  Service: Orthopedics;  Laterality: Bilateral;  spinal/epidural per anesthesiologist   TRANSMETATARSAL AMPUTATION Left 03/15/2022   Procedure: LEFT TRANSMETATARSAL AMPUTATION;  Surgeon: [name], MD;  Location: [place];  Service: Orthopedics;  Laterality: Left;   Patient Active Problem List   Diagnosis Date Noted   Suicidal ideations 06/09/2022   Chills (without fever) 06/09/2022   Severe depression ([place]) 06/09/2022   Acute cough 06/09/2022   Reactive depression 05/31/2022   Belching 05/27/2022   Gastroesophageal reflux disease 05/27/2022   Atrial fibrillation ([place]) 05/20/2022   Long term (current) use of anticoagulants 05/20/2022   S/P transmetatarsal amputation of foot, left ([place]) 04/18/2022   CKD (chronic kidney disease) stage 5, GFR less than 15 ml/min ([place]) 04/18/2022   Secondary hyperparathyroidism of renal origin ([place]) 04/10/2022   Pruritus, unspecified 04/10/2022   Nausea 04/10/2022   Iron deficiency anemia, unspecified 04/10/2022   Hypothyroidism, unspecified 04/10/2022   Other acute kidney failure ([place]) 04/10/2022   Dyspnea, unspecified 27/74/1287   Eosinophilic esophagitis 86/76/7209   Gastric ulcer, unspecified as acute or  chronic, without hemorrhage or perforation 04/10/2022   Coagulation defect, unspecified ([place]) 04/10/2022   Allergy, unspecified, initial encounter 04/10/2022   Anaphylactic shock, unspecified, initial encounter 04/10/2022   Arthralgia of right lower leg    Acute blood loss anemia    Unilateral complete BKA, right, initial encounter ([place]) 04/03/2022   S/P BKA (below knee amputation), right ([place]) 04/03/2022   Old myocardial infarction 04/02/2022   Dependence on renal dialysis ([place]) 04/02/2022   Acquired absence of right leg below knee ([place]) 04/02/2022   Cirrhosis ([place]) 03/31/2022   Fournier's gangrene in male     Scrotal infection 03/25/2022   Normocytic anemia 03/25/2022   Diarrhea 03/25/2022   Shock liver 03/25/2022   Acute respiratory failure with hypoxia ([place]) 03/25/2022   Gout 03/25/2022   Acute nonintractable headache 03/25/2022   Demand ischemia ([place]) 03/25/2022   Thrombocytopenia ([place]) 03/25/2022   Obesity, Class III, BMI 40-49.9 (morbid obesity) ([place]) 03/25/2022   Cardiac arrest ([place]) 03/25/2022   Hypotension    Respiratory insufficiency    Cellulitis    Malnutrition of moderate degree 03/12/2022   Altered mental status    Gangrene of right foot ([place])    Gangrene of left foot ([place])    Severe protein-calorie malnutrition ([place]) 03/10/2022   Encounter for central line placement    Pressure injury of skin 02/23/2022   Septic shock ([place]) 02/22/2022   AKI (acute kidney injury) ([place])    Generalized abdominal pain    Lactic acidosis    Atrial fibrillation with RVR ([name])    Hypokalemia    Hypomagnesemia    Pain in finger of right hand 08/14/2021   Degenerative joint disease of shoulder region 05/07/2020   Olecranon bursitis of right elbow 05/07/2020   Pain in joint of right shoulder 04/26/2020   S/P TKR (total knee replacement), bilateral 11/04/2018   Bilateral primary osteoarthritis of knee    DDD (degenerative disc disease), cervical 01/10/2018   Rupture of medial head of gastrocnemius, left, sequela 01/14/2017   Kidney stones 06/22/2016   Essential hypertension 08/01/2010   DYSTHYMIC DISORDER 07/08/2010   PALPITATIONS 07/08/2010    REFERRING DIAG: Z89.511 (ICD-10-CM) - Hx of below knee amputation, right   ONSET DATE: 06/19/2022 Prosthesis delivery  THERAPY DIAG:  Other abnormalities of gait and mobility  Muscle weakness (generalized)  Unsteadiness on feet  Non-pressure chronic ulcer of skin of other sites with other specified severity ([name])  Edema, unspecified type  Foot drop, left  Rationale for Evaluation and Treatment Rehabilitation  PERTINENT HISTORY: Rt BKA  03/15/22, Left MTA 03/15/2022, bil. TKA 11/04/18, ESRD / chronic kidney disease st5 dialysis Tues/Thurs/Sat, cirrhosis, cardiac arrest May 23 post intubation, A-Fib  PRECAUTIONS: Fall  SUBJECTIVE:  he wore prosthesis all day yesterday without issues.  He walked with RW in home with issues and in/out some restaurants.  He did ramp also.   PAIN:  Are you having pain? No   OBJECTIVE: (objective measures completed at initial evaluation unless otherwise dated)  OBJECTIVE:    COGNITION: 06/22/2022  Overall cognitive status: Within functional limits for tasks assessed and pt had flat affect so unable to fully assess cognition.   POSTURE: 06/22/2022  rounded shoulders, forward head, flexed trunk , and weight shift left   LOWER EXTREMITY ROM:   ROM P:passive  A:active Right Eval 06/22/2022 Left Eval 06/22/2022  Hip flexion      Hip extension Standing A: ~30* limitation Standing A: ~30* limitation  Hip abduction      Hip  adduction      Hip internal rotation      Hip external rotation      Knee flexion      Knee extension Standing A: ~15* limitation Standing A: ~15* limitation  Ankle dorsiflexion   Standing A: ~25* limitation  Ankle plantarflexion      Ankle inversion      Ankle eversion       (Blank rows = not tested)   LOWER EXTREMITY MMT:   MMT Right Eval 06/22/2022 Left Eval 06/22/2022  Hip flexion 4/5 4/5  Hip extension 2/5 functional 2/5 functional  Hip abduction 2/5 functional 2/5 functional  Hip adduction      Hip internal rotation      Hip external rotation      Knee flexion      Knee extension 3-/5 functional 3-/5 functional  Ankle dorsiflexion 2-/5    Ankle plantarflexion      Ankle inversion      Ankle eversion      (Blank rows = not tested)   TRANSFERS: 06/22/2022  Sit to stand: Mod A and from 22" w/c requiring armrests to push & RW to stabilize 06/22/2022  Stand to sit: Min A from RW to 22" w/c unable to release RW to reach for w/c until seated.    GAIT: 06/22/2022 Gait pattern: step to pattern, decreased step length- Left, decreased stance time- Right, decreased stride length, decreased hip/knee flexion- Right, circumduction- Right, Right hip hike, knee flexed in stance- Right, knee flexed in stance- Left, antalgic, trunk flexed, abducted- Right, and poor foot clearance- Left excessive weightbearing BUEs on RW Distance walked: 5' Assistive device utilized: Environmental consultant - 2 wheeled and right TTA prosthesis / left plate & shoe filler Level of assistance: Mod A Comments: post gait HR 82, SpO2 99% with dyspnea noted   FUNCTIONAL TESTs:  06/22/2022  Static standing balance with BUE support on RW for 1 min with minA.     CURRENT PROSTHETIC WEAR ASSESSMENT: 06/22/2022 Patient is dependent with: skin check, residual limb care, care of non-amputated limb, prosthetic cleaning, ply sock cleaning, correct ply sock adjustment, proper wear schedule/adjustment, and proper weight-bearing schedule/adjustment Donning prosthesis: Total A and max cues required, Doffing prosthesis: Total A and wife able to doffe Prosthetic wear tolerance: 1-2 hours 1-2x/day, for 3 of 3 days since delivery.  Prosthetic weight bearing tolerance: 0 minutes unable to stand outside of PT Edema: pitting with >5sec capillary refill Residual limb condition: 78m wound with serous drainage & black scab present,  calf measurement is 42.2cm Prosthetic description:  silicon liner with pin suspension, flexible inner socket Total Contact design, dynamic response foot with hydraulic PF/DF K code/activity level with prosthetic use: Level 3     TODAY'S TREATMENT:  07/13/2022 Prosthetic Care with right TTA prosthesis & left TMA shoe filler with carbon plate: No skin changes with wound scab is no longer over moist. PT recommended to continue underliner but can trim upper band if pressure over tibial plateau is an issue. Recommend wear all awake hours and switching underliner every 4 hours.  Pt &  wife verbalized understanding.  PT demo & verbal cues on sequence and technique for gait with cane stand alone tip.  Pt amb around table RUE support and cane LUE with min guard initial 20' and supervision 100'.  Pt & wife to look for cane to purchase.    Therapeutic Activities: Sit to / from stand from 24" bar stool with hands on thighs and goal to  not touch //bars to stabilize, 5 reps with minA Step up & step downs 4" step 3 reps BLEs using BUEs //bars with min guard. PT demo & verbal cues on technique.   Neuromuscular Re-education:  Standing balance on floor eyes open head movements right/left, up/down & 2 diagonals using mirror for upright posture bw ea motion for 5 reps ea direction with minA. Static stance on floor eyes closed 3 reps up to 20 sec with min guard. Then head turns eyes closed with modA.  Static stance on foam eyes open 3 reps up to 11 sec with min guard. Then head turns eyes open with modA.  Static stance on foam eyes closed with minA 5 sec.  07/08/2022 Prosthetic Care with right TTA prosthesis & left TMA shoe filler with carbon plate: No skin changes except wound scab is no longer over moist. PT recommended to continue underliner but can trim upper band if pressure over tibial plateau is an issue. Continue wear all awake hours except 2 hours mid-day.  Pt & wife verbalized understanding.  PT recommended working on walking in home with RW with wife supervision to move from one area to another and not sitting in w/c.  Begin to exit & enter home & community walking with RW and wife supervision / following with w/c until he fatigues. Pt & wife verbalized understanding.   Therapeutic Activities: PT demo & verbal cues on sit to/from stand chairs without armrests not stabilized. Pt able to stand from 18" chair without armrests to RW 2 reps with minA / tactile cues to shift weight over feet.  Stand to sit with verbal cues for technique improved control 2nd rep. Sit to / from stand from  24" bar stool with hands on thighs and goal to not touch //bars to stabilize, 5 reps with minA, 1 rep 3 sets with minA during remainder of session.  Neuromuscular Re-education: Standing posture using door frame behind RW ant - reaching overhead single UE 2 reps ea and BUEs 2 reps with hold 2 deep breaths. PT recommended 3+ times / day when passing thru to work on posture. Pt & wife verbalized understanding. Standing balance on floor eyes open head movements right/left, up/down & 2 diagonals using mirror for upright posture bw ea motion for 5 reps ea direction with minA. Static stance on floor eyes closed 3 reps up to 20 sec with min guard. Static stance on foam eyes open 3 reps up to 11 sec with min guard.  07/06/2022 Prosthetic Care with right TTA prosthesis & left TMA shoe filler with carbon plate: Pt reports prosthesis feels like it is moving up with weight bearing & pulling down with swing.  PT assessed in standing with <1/4" movement in patella relief between weight bearing & open chain which is probably soft tissue normal movement. Wound is not changing in size but scab is white indicating excessive moisture.  PT instructed in use of Vivewear underliner for wound healing.  PT recommended switching underliner between 1st & 2nd wears. PT instructed in cleaning & drying nightly. Pt & wife verbalized understanding. PT recommended wearing prosthesis from getting out of bath in mornings until bedtime except 2 hours midday. Wearing prosthesis to dialysis and removing for 2 hour break when he gets home. Pt verbalized understanding of subtracting prosthesis weight of 2.3kg from weigh-in & weigh-out at dialysis. Pt & wife verbalized understanding.   Pt ambulated 80' with RW with supervision with visual & verbal cues for step width and upright  posture. PT verbal & demo cues on neg ramp & curb. Pt neg with RW with supervision.  Pt ambulated 205' from clinic to car with RW with supervision. He reports  furthest that he has walked since amputation. PT recommended sitting on all furniture at home to make sure that he can arise. Then not using w/c for sitting in home unless fatigued like after dialysis.  PT recommended ambulating with wife's supervision to change locations in home. PT demo how to follow with w/c & supervise for long distance. PT recommended walking max tolerable distance 1-2 x/day with assumption that dialysis days will be less than non-dialysis days. Pt & wife verbalized understanding.  07/01/2022 Prosthetic Care with right TTA prosthesis & left TMA shoe filler with carbon plate: Pt arrived with ~1/2" gap at bottom of liner.  PT reviewed donning technique with issues if distal liner not in direct contact.  Pt able to donne with verbal cues & light touch on distal liner. PT recommended increasing wear to 6hrs 2x/day with 2 hours off bw wears but not to dialysis yet and patting limb/liner dry ~ midpoint of 6 hours. PT recommended wearing prosthesis even on days that he does not feel well but decreasing activity level accordingly.  Pt & wife verbalized understanding of all instructions. PT instructed in adjusting ply socks with handout, verbal & tactile cues. Donning & ambulating with too few, too many & correct ply socks.  Pt amb 20' X 3 with RW with CGA. PT demo & verbal cues on proper step width for stance limb weight bearing over limb & improve hip stability. Pt ambulated 59' with RW with visual cues on RW with supervision & verbal cues. PT demo & verbal cues on car transfer with RW; pt return demo & verbalized understanding with CGA.     HOME EXERCISE PROGRAM:  AT [facility] FIND YOUR MIDLINE POSITION AND PLACE FEET EQUAL DISTANCE FROM THE MIDLINE.  Try to find this position when standing still for activities.   USE TAPE ON FLOOR TO MARK THE MIDLINE POSITION which is even with middle of sink.  You also should try to feel with your limb pressure in socket.  You are trying to feel with limb  what you used to feel with the bottom of your foot.  Side to Side Shift: Moving your hips only (not shoulders): move weight onto your left leg, HOLD/FEEL pressure in socket.  Move back to equal weight on each leg, HOLD/FEEL pressure in socket. Move weight onto your right leg, HOLD/FEEL pressure in socket. Move back to equal weight on each leg, HOLD/FEEL pressure in socket. Repeat.  Start with both hands on sink, progress to hand on prosthetic side only, then no hands.  Front to Back Shift: Moving your hips only (not shoulders): move your weight forward onto your toes, HOLD/FEEL pressure in socket. Move your weight back to equal Flat Foot on both legs, HOLD/FEEL  pressure in socket. Move your weight back onto your heels, HOLD/FEEL  pressure in socket. Move your weight back to equal on both legs, HOLD/FEEL  pressure in socket. Repeat.  Start with both hands on sink, progress to hand on prosthetic side only, then no hands.  Moving Cones / Cups: With equal weight on each leg: Hold on with one hand the first time, then progress to no hand supports. Move cups from one side of sink to the other. Place cups ~2" out of your reach, progress to 10" beyond reach.  Place one hand  in middle of sink and reach with other hand. Do both arms.  Then hover one hand and move cups with other hand.  Overhead/Upward Reaching: alternated reaching up to top cabinets or ceiling if no cabinets present. Keep equal weight on each leg. Start with one hand support on counter while other hand reaches and progress to no hand support with reaching.  ace one hand in middle of sink and reach with other hand. Do both arms.  Then hover one hand and move cups with other hand.  5.   Looking Over Shoulders: With equal weight on each leg: alternate turning to look over your shoulders with one hand support on counter as needed.  Start with head motions only to look in front of shoulder, then even with shoulder and progress to looking behind you. To look  to side, move head /eyes, then shoulder on side looking pulls back, shift more weight to side looking and pull hip back. Place one hand in middle of sink and let go with other hand so your shoulder can pull back. Switch hands to look other way.   Then hover one hand and look over shoulder. If looking right, use left hand at sink. If looking left, use right hand at sink. 6.  Stepping with leg that is not amputated:  Move items under cabinet out of your way. Shift your hips/pelvis so weight on prosthesis. Tighten muscles in hip on prosthetic side.  SLOWLY step other leg so front of foot is in cabinet. Then step back to floor.   Pt also recommending sitting at edge of w/c with feet supported on floor but no back support each hour in w/c until back fatigues. PT also demo & instructed in sitting on 24" stool. Pt & wife verbalized understanding.     ASSESSMENT:   CLINICAL IMPRESSION: PT introduced prosthetic gait with cane today using table in contralateral UE for support which he improved with instruction.  Pt & wife report increased activity tolerance outside of PT utilizing what he has learned in PT.  His balance improves with PT activities but is high fall risk.  Pt continues to benefit from skilled PT.    OBJECTIVE IMPAIRMENTS Abnormal gait, cardiopulmonary status limiting activity, decreased activity tolerance, decreased balance, decreased endurance, decreased knowledge of condition, decreased knowledge of use of DME, decreased mobility, difficulty walking, decreased ROM, decreased strength, increased edema, impaired flexibility, postural dysfunction, and prosthetic dependency .    ACTIVITY LIMITATIONS carrying, lifting, bending, sitting, standing, squatting, stairs, transfers, locomotion level, and prosthesis use   PARTICIPATION LIMITATIONS: driving, community activity, occupation, yard work, and recreational of camping   [place], Past/current experiences, Time since onset of  injury/illness/exacerbation, and 3+ comorbidities: see PMH  are also affecting patient's functional outcome.    REHAB POTENTIAL: Good   CLINICAL DECISION MAKING: Evolving/moderate complexity   EVALUATION COMPLEXITY: Moderate     GOALS: Goals reviewed with patient? Yes   SHORT TERM GOALS: Target date: 07/22/2022   Patient donnes prosthesis modified independent & verbalizes proper cleaning. Baseline: SEE OBJECTIVE DATA Goal status: INITIAL 2.  Patient tolerates prosthesis >10 hrs total /day without increase in skin issues & no limb pain. Baseline: SEE OBJECTIVE DATA Goal status: INITIAL   3.  Patient able to reach 5" and look over both shoulders with RW support with supervision. Baseline: SEE OBJECTIVE DATA Goal status: INITIAL   4. Patient ambulates 35' with RW & prosthesis with minA. Baseline: SEE OBJECTIVE DATA Goal status: INITIAL  LONG TERM GOALS: Target date: 08/27/2022   Patient demonstrates & verbalized understanding of prosthetic care to enable safe utilization of prosthesis. Baseline: SEE OBJECTIVE DATA Goal status: INITIAL   Patient tolerates prosthesis wear >90% of awake hours without skin or limb pain issues. Baseline: SEE OBJECTIVE DATA Goal status: INITIAL   Berg Balance >/= 36/56 to indicate lower fall risk Baseline: SEE OBJECTIVE DATA Goal status: INITIAL   Patient ambulates >300' with prosthesis & LRAD independently Baseline: SEE OBJECTIVE DATA Goal status: INITIAL   Patient negotiates ramps, curbs & stairs with single rail with prosthesis & LRAD independently. Baseline: SEE OBJECTIVE DATA Goal status: INITIAL     PLAN: PT FREQUENCY: 2x/week   PT DURATION: 10 weeks   PLANNED INTERVENTIONS: Therapeutic exercises, Therapeutic activity, Neuromuscular re-education, Balance training, Gait training, Patient/Family education, Self Care, Stair training, Vestibular training, Canalith repositioning, Prosthetic training, DME instructions,  Vasopneumatic device, and physical performance testing   PLAN FOR NEXT SESSION: continue to check wound with wear to all awake hours. continue to progress Prosthetic gait with cane & HHA. standing balance.    Jamey Reas, PT, DPT 07/13/2022, 12:35 PM

## 2022-07-14 DIAGNOSIS — N2581 Secondary hyperparathyroidism of renal origin: Secondary | ICD-10-CM | POA: Diagnosis not present

## 2022-07-14 DIAGNOSIS — N186 End stage renal disease: Secondary | ICD-10-CM | POA: Diagnosis not present

## 2022-07-14 DIAGNOSIS — Z992 Dependence on renal dialysis: Secondary | ICD-10-CM | POA: Diagnosis not present

## 2022-07-15 ENCOUNTER — Ambulatory Visit (INDEPENDENT_AMBULATORY_CARE_PROVIDER_SITE_OTHER): Payer: BC Managed Care – PPO | Admitting: Physical Therapy

## 2022-07-15 ENCOUNTER — Encounter: Payer: Self-pay | Admitting: Physical Therapy

## 2022-07-15 DIAGNOSIS — R2689 Other abnormalities of gait and mobility: Secondary | ICD-10-CM | POA: Diagnosis not present

## 2022-07-15 DIAGNOSIS — M6281 Muscle weakness (generalized): Secondary | ICD-10-CM | POA: Diagnosis not present

## 2022-07-15 DIAGNOSIS — L98498 Non-pressure chronic ulcer of skin of other sites with other specified severity: Secondary | ICD-10-CM

## 2022-07-15 DIAGNOSIS — M21372 Foot drop, left foot: Secondary | ICD-10-CM

## 2022-07-15 DIAGNOSIS — R609 Edema, unspecified: Secondary | ICD-10-CM

## 2022-07-15 DIAGNOSIS — R2681 Unsteadiness on feet: Secondary | ICD-10-CM

## 2022-07-15 NOTE — Therapy (Signed)
OUTPATIENT PHYSICAL THERAPY TREATMENT NOTE   Patient Name: [place] [facility] MRN: [id number] DOB:December 17, 1960, 61 y.o., male Today's Date: 07/15/2022  PCP: [name], NP-C REFERRING PROVIDER: [name], MD  END OF SESSION:   PT End of Session - 07/15/22 0844     Visit Number 8    Number of Visits 20    Authorization Type [employer] comm PPO    Authorization Time Period 20 visit limit PT/OT/chiro, $35 co-pay    Authorization - Visit Number 8    Authorization - Number of Visits 20    Progress Note Due on Visit 10    PT Start Time 0845    PT Stop Time 0930    PT Time Calculation (min) 45 min    Equipment Utilized During Treatment Gait belt    Activity Tolerance Patient tolerated treatment well;Patient limited by fatigue    Behavior During Therapy WFL for tasks assessed/performed;Flat affect                   Past Medical History:  Diagnosis Date   Concussion    Eosinophilic esophagitis    EGD/bx by Dr. Havery Moros   Gastric ulcer    History of kidney stones    Intermittent dysphagia    Migraine    Past Surgical History:  Procedure Laterality Date   AMPUTATION Right 03/15/2022   Procedure: RIGHT BELOW KNEE AMPUTATION;  Surgeon: [name], MD;  Location: [place];  Service: Orthopedics;  Laterality: Right;   CYSTOSCOPY     DENTAL SURGERY     FINGER SURGERY     HERNIA REPAIR     at age 19   IR FLUORO GUIDE CV LINE RIGHT  03/09/2022   IR PATIENT EVAL TECH 0-60 MINS  03/09/2022   IR US GUIDE VASC ACCESS RIGHT  03/09/2022   KNEE ARTHROSCOPY Left    LITHOTRIPSY     NEPHROLITHOTOMY     SCROTAL EXPLORATION N/A 03/16/2022   Procedure: IRRIGATION AND DEBRIDEMENT SCROTUM;  Surgeon: Raynelle Bring, MD;  Location: [place];  Service: Urology;  Laterality: N/A;   SCROTAL EXPLORATION N/A 03/28/2022   Procedure: IRRIGATION AND DEBRIDEMENT SCROTUM;  Surgeon: Ardis Hughs, MD;  Location: [facility];  Service: Urology;  Laterality: N/A;   TONSILLECTOMY     TOTAL KNEE  ARTHROPLASTY Bilateral 11/04/2018   Procedure: BILATERAL TOTAL KNEE ARTHROPLASTY;  Surgeon: [name], MD;  Location: [place];  Service: Orthopedics;  Laterality: Bilateral;  spinal/epidural per anesthesiologist   TRANSMETATARSAL AMPUTATION Left 03/15/2022   Procedure: LEFT TRANSMETATARSAL AMPUTATION;  Surgeon: [name], MD;  Location: [place];  Service: Orthopedics;  Laterality: Left;   Patient Active Problem List   Diagnosis Date Noted   Suicidal ideations 06/09/2022   Chills (without fever) 06/09/2022   Severe depression ([place]) 06/09/2022   Acute cough 06/09/2022   Reactive depression 05/31/2022   Belching 05/27/2022   Gastroesophageal reflux disease 05/27/2022   Atrial fibrillation ([place]) 05/20/2022   Long term (current) use of anticoagulants 05/20/2022   S/P transmetatarsal amputation of foot, left ([name]) 04/18/2022   CKD (chronic kidney disease) stage 5, GFR less than 15 ml/min ([place]rey Ayala) 04/18/2022   Secondary hyperparathyroidism of renal origin ([place] [facility]ala) 04/10/2022   Pruritus, unspecified 04/10/2022   Nausea 04/10/2022   Iron deficiency anemia, unspecified 04/10/2022   Hypothyroidism, unspecified 04/10/2022   Other acute kidney failure ([place]) 04/10/2022   Dyspnea, unspecified 83/41/9622   Eosinophilic esophagitis 29/79/8921   Gastric ulcer, unspecified as acute  or chronic, without hemorrhage or perforation 04/10/2022   Coagulation defect, unspecified ([name]) 04/10/2022   Allergy, unspecified, initial encounter 04/10/2022   Anaphylactic shock, unspecified, initial encounter 04/10/2022   Arthralgia of right lower leg    Acute blood loss anemia    Unilateral complete BKA, right, initial encounter ([place]ala) 04/03/2022   S/P BKA (below knee amputation), right ([place]) 04/03/2022   Old myocardial infarction 04/02/2022   Dependence on renal dialysis ([place]) 04/02/2022   Acquired absence of right leg below knee ([place]rey Ayala) 04/02/2022   Cirrhosis ([place]ala) 03/31/2022   Fournier's gangrene in male     Scrotal infection 03/25/2022   Normocytic anemia 03/25/2022   Diarrhea 03/25/2022   Shock liver 03/25/2022   Acute respiratory failure with hypoxia ([name]) 03/25/2022   Gout 03/25/2022   Acute nonintractable headache 03/25/2022   Demand ischemia ([place]) 03/25/2022   Thrombocytopenia ([name]) 03/25/2022   Obesity, Class III, BMI 40-49.9 (morbid obesity) ([name]) 03/25/2022   Cardiac arrest ([place]rey Ayala) 03/25/2022   Hypotension    Respiratory insufficiency    Cellulitis    Malnutrition of moderate degree 03/12/2022   Altered mental status    Gangrene of right foot ([place] [facility]ala)    Gangrene of left foot ([place] [facility]ala)    Severe protein-calorie malnutrition ([place] Ayala) 03/10/2022   Encounter for central line placement    Pressure injury of skin 02/23/2022   Septic shock ([place]) 02/22/2022   AKI (acute kidney injury) ([place]rey Ayala)    Generalized abdominal pain    Lactic acidosis    Atrial fibrillation with RVR ([place] [facility])    Hypokalemia    Hypomagnesemia    Pain in finger of right hand 08/14/2021   Degenerative joint disease of shoulder region 05/07/2020   Olecranon bursitis of right elbow 05/07/2020   Pain in joint of right shoulder 04/26/2020   S/P TKR (total knee replacement), bilateral 11/04/2018   Bilateral primary osteoarthritis of knee    DDD (degenerative disc disease), cervical 01/10/2018   Rupture of medial head of gastrocnemius, left, sequela 01/14/2017   Kidney stones 06/22/2016   Essential hypertension 08/01/2010   DYSTHYMIC DISORDER 07/08/2010   PALPITATIONS 07/08/2010    REFERRING DIAG: Z89.511 (ICD-10-CM) - Hx of below knee amputation, right   ONSET DATE: 06/19/2022 Prosthesis delivery  THERAPY DIAG:  Other abnormalities of gait and mobility  Muscle weakness (generalized)  Unsteadiness on feet  Non-pressure chronic ulcer of skin of other sites with other specified severity ([place] [facility])  Edema, unspecified type  Foot drop, left  Rationale for Evaluation and Treatment Rehabilitation  PERTINENT HISTORY: Rt BKA  03/15/22, Left MTA 03/15/2022, bil. TKA 11/04/18, ESRD / chronic kidney disease st5 dialysis Tues/Thurs/Sat, cirrhosis, cardiac arrest May 23 post intubation, A-Fib  PRECAUTIONS: Fall  SUBJECTIVE:  He continues to wear prosthesis all awake hours including dialysis.   PAIN:  Are you having pain? No   OBJECTIVE: (objective measures completed at initial evaluation unless otherwise dated)  OBJECTIVE:    COGNITION: 06/22/2022  Overall cognitive status: Within functional limits for tasks assessed and pt had flat affect so unable to fully assess cognition.   POSTURE: 06/22/2022  rounded shoulders, forward head, flexed trunk , and weight shift left   LOWER EXTREMITY ROM:   ROM P:passive  A:active Right Eval 06/22/2022 Left Eval 06/22/2022  Hip flexion      Hip extension Standing A: ~30* limitation Standing A: ~30* limitation  Hip abduction      Hip adduction      Hip internal rotation      Hip  external rotation      Knee flexion      Knee extension Standing A: ~15* limitation Standing A: ~15* limitation  Ankle dorsiflexion   Standing A: ~25* limitation  Ankle plantarflexion      Ankle inversion      Ankle eversion       (Blank rows = not tested)   LOWER EXTREMITY MMT:   MMT Right Eval 06/22/2022 Left Eval 06/22/2022  Hip flexion 4/5 4/5  Hip extension 2/5 functional 2/5 functional  Hip abduction 2/5 functional 2/5 functional  Hip adduction      Hip internal rotation      Hip external rotation      Knee flexion      Knee extension 3-/5 functional 3-/5 functional  Ankle dorsiflexion 2-/5    Ankle plantarflexion      Ankle inversion      Ankle eversion      (Blank rows = not tested)   TRANSFERS: 06/22/2022  Sit to stand: Mod A and from 22" w/c requiring armrests to push & RW to stabilize 06/22/2022  Stand to sit: Min A from RW to 22" w/c unable to release RW to reach for w/c until seated.   GAIT: 06/22/2022 Gait pattern: step to pattern, decreased step length- Left,  decreased stance time- Right, decreased stride length, decreased hip/knee flexion- Right, circumduction- Right, Right hip hike, knee flexed in stance- Right, knee flexed in stance- Left, antalgic, trunk flexed, abducted- Right, and poor foot clearance- Left excessive weightbearing BUEs on RW Distance walked: 5' Assistive device utilized: Environmental consultant - 2 wheeled and right TTA prosthesis / left plate & shoe filler Level of assistance: Mod A Comments: post gait HR 82, SpO2 99% with dyspnea noted   FUNCTIONAL TESTs:  06/22/2022  Static standing balance with BUE support on RW for 1 min with minA.     CURRENT PROSTHETIC WEAR ASSESSMENT: 06/22/2022 Patient is dependent with: skin check, residual limb care, care of non-amputated limb, prosthetic cleaning, ply sock cleaning, correct ply sock adjustment, proper wear schedule/adjustment, and proper weight-bearing schedule/adjustment Donning prosthesis: Total A and max cues required, Doffing prosthesis: Total A and wife able to doffe Prosthetic wear tolerance: 1-2 hours 1-2x/day, for 3 of 3 days since delivery.  Prosthetic weight bearing tolerance: 0 minutes unable to stand outside of PT Edema: pitting with >5sec capillary refill Residual limb condition: 62m wound with serous drainage & black scab present,  calf measurement is 42.2cm Prosthetic description:  silicon liner with pin suspension, flexible inner socket Total Contact design, dynamic response foot with hydraulic PF/DF K code/activity level with prosthetic use: Level 3     TODAY'S TREATMENT:  07/15/2022 Neuromuscular Re-ed for standing balance Standing in corner with RW in front for safety:  Standing on floor eyes open feet ~4-5" apart head turns right/left, up/down & 2 diagonals with intermittent touch RW & walls  Standing on floor eyes closed feet ~6-7" apart head turns right/left, up/down & 2 diagonals with light touch on side of RW  Standing on foam eyes open feet ~6-7" apart head turns  right/left, up/down & 2 diagonals with light touch on side of RW PT educated on above as HEP including safe set-up & wife assisting. Pt & wife verbalized understanding. PT reviewed doing HEP at sink, standing with back to door frame, above HEP & progressive walking program (short increasing frequency, long / max tolerable with wife following with w/c 1-2x/day & medium bw short & long distance 4+/day).  Breaking up  each group during day which will be very limited on dialysis days to build overall stamina for standing & walking.  Pt & wife verbalized understanding.  Prosthetic Care with right TTA prosthesis & left TMA shoe filler with carbon plate: Continue wear all awake hours and switching underliner every 4 hours.  Pt & wife verbalized understanding. Pt ambulated 25' with cane stand alone tip & HHA with minA. Tactile & verbal cues on sequence, upright posture, balance reactions & wt shift over stance LE.   07/13/2022 Prosthetic Care with right TTA prosthesis & left TMA shoe filler with carbon plate: No skin changes with wound scab is no longer over moist. PT recommended to continue underliner but can trim upper band if pressure over tibial plateau is an issue. Recommend wear all awake hours and switching underliner every 4 hours.  Pt & wife verbalized understanding.  PT demo & verbal cues on sequence and technique for gait with cane stand alone tip.  Pt amb around table RUE support and cane LUE with min guard initial 20' and supervision 100'.  Pt & wife to look for cane to purchase.    Therapeutic Activities: Sit to / from stand from 24" bar stool with hands on thighs and goal to not touch //bars to stabilize, 5 reps with minA Step up & step downs 4" step 3 reps BLEs using BUEs //bars with min guard. PT demo & verbal cues on technique.   Neuromuscular Re-education:  Standing balance on floor eyes open head movements right/left, up/down & 2 diagonals using mirror for upright posture bw ea motion for  5 reps ea direction with minA. Static stance on floor eyes closed 3 reps up to 20 sec with min guard. Then head turns eyes closed with modA.  Static stance on foam eyes open 3 reps up to 11 sec with min guard. Then head turns eyes open with modA.  Static stance on foam eyes closed with minA 5 sec.  07/08/2022 Prosthetic Care with right TTA prosthesis & left TMA shoe filler with carbon plate: No skin changes except wound scab is no longer over moist. PT recommended to continue underliner but can trim upper band if pressure over tibial plateau is an issue. Continue wear all awake hours except 2 hours mid-day.  Pt & wife verbalized understanding.  PT recommended working on walking in home with RW with wife supervision to move from one area to another and not sitting in w/c.  Begin to exit & enter home & community walking with RW and wife supervision / following with w/c until he fatigues. Pt & wife verbalized understanding.   Therapeutic Activities: PT demo & verbal cues on sit to/from stand chairs without armrests not stabilized. Pt able to stand from 18" chair without armrests to RW 2 reps with minA / tactile cues to shift weight over feet.  Stand to sit with verbal cues for technique improved control 2nd rep. Sit to / from stand from 24" bar stool with hands on thighs and goal to not touch //bars to stabilize, 5 reps with minA, 1 rep 3 sets with minA during remainder of session.  Neuromuscular Re-education: Standing posture using door frame behind RW ant - reaching overhead single UE 2 reps ea and BUEs 2 reps with hold 2 deep breaths. PT recommended 3+ times / day when passing thru to work on posture. Pt & wife verbalized understanding. Standing balance on floor eyes open head movements right/left, up/down & 2 diagonals using mirror for  upright posture bw ea motion for 5 reps ea direction with minA. Static stance on floor eyes closed 3 reps up to 20 sec with min guard. Static stance on foam eyes open  3 reps up to 11 sec with min guard.    HOME EXERCISE PROGRAM:  AT [facility] FIND YOUR MIDLINE POSITION AND PLACE FEET EQUAL DISTANCE FROM THE MIDLINE.  Try to find this position when standing still for activities.   USE TAPE ON FLOOR TO MARK THE MIDLINE POSITION which is even with Jeffrey of sink.  You also should try to feel with your limb pressure in socket.  You are trying to feel with limb what you used to feel with the bottom of your foot.  Side to Side Shift: Moving your hips only (not shoulders): move weight onto your left leg, HOLD/FEEL pressure in socket.  Move back to equal weight on each leg, HOLD/FEEL pressure in socket. Move weight onto your right leg, HOLD/FEEL pressure in socket. Move back to equal weight on each leg, HOLD/FEEL pressure in socket. Repeat.  Start with both hands on sink, progress to hand on prosthetic side only, then no hands.  Front to Back Shift: Moving your hips only (not shoulders): move your weight forward onto your toes, HOLD/FEEL pressure in socket. Move your weight back to equal Flat Foot on both legs, HOLD/FEEL  pressure in socket. Move your weight back onto your heels, HOLD/FEEL  pressure in socket. Move your weight back to equal on both legs, HOLD/FEEL  pressure in socket. Repeat.  Start with both hands on sink, progress to hand on prosthetic side only, then no hands.  Moving Cones / Cups: With equal weight on each leg: Hold on with one hand the first time, then progress to no hand supports. Move cups from one side of sink to the other. Place cups ~2" out of your reach, progress to 10" beyond reach.  Place one hand in Jeffrey of sink and reach with other hand. Do both arms.  Then hover one hand and move cups with other hand.  Overhead/Upward Reaching: alternated reaching up to top cabinets or ceiling if no cabinets present. Keep equal weight on each leg. Start with one hand support on counter while other hand reaches and progress to no hand support with reaching.  ace  one hand in Jeffrey of sink and reach with other hand. Do both arms.  Then hover one hand and move cups with other hand.  5.   Looking Over Shoulders: With equal weight on each leg: alternate turning to look over your shoulders with one hand support on counter as needed.  Start with head motions only to look in front of shoulder, then even with shoulder and progress to looking behind you. To look to side, move head /eyes, then shoulder on side looking pulls back, shift more weight to side looking and pull hip back. Place one hand in Jeffrey of sink and let go with other hand so your shoulder can pull back. Switch hands to look other way.   Then hover one hand and look over shoulder. If looking right, use left hand at sink. If looking left, use right hand at sink. 6.  Stepping with leg that is not amputated:  Move items under cabinet out of your way. Shift your hips/pelvis so weight on prosthesis. Tighten muscles in hip on prosthetic side.  SLOWLY step other leg so front of foot is in cabinet. Then step back to floor.   Pt  also recommending sitting at edge of w/c with feet supported on floor but no back support each hour in w/c until back fatigues. PT also demo & instructed in sitting on 24" stool. Pt & wife verbalized understanding.     ASSESSMENT:   CLINICAL IMPRESSION: Pt & wife appear to understand to updated & ongoing HEP including rationale.  He improved balance with repetition but was challenged by activity. Prosthetic gait with less UE support did not cause limb pain but was challenged with his balance.  Pt continues to benefit from skilled PT.    OBJECTIVE IMPAIRMENTS Abnormal gait, cardiopulmonary status limiting activity, decreased activity tolerance, decreased balance, decreased endurance, decreased knowledge of condition, decreased knowledge of use of DME, decreased mobility, difficulty walking, decreased ROM, decreased strength, increased edema, impaired flexibility, postural dysfunction, and  prosthetic dependency .    ACTIVITY LIMITATIONS carrying, lifting, bending, sitting, standing, squatting, stairs, transfers, locomotion level, and prosthesis use   PARTICIPATION LIMITATIONS: driving, community activity, occupation, yard work, and recreational of camping   [place], Past/current experiences, Time since onset of injury/illness/exacerbation, and 3+ comorbidities: see PMH  are also affecting patient's functional outcome.    REHAB POTENTIAL: Good   CLINICAL DECISION MAKING: Evolving/moderate complexity   EVALUATION COMPLEXITY: Moderate     GOALS: Goals reviewed with patient? Yes   SHORT TERM GOALS: Target date: 07/22/2022   Patient donnes prosthesis modified independent & verbalizes proper cleaning. Baseline: SEE OBJECTIVE DATA Goal status: INITIAL 2.  Patient tolerates prosthesis >10 hrs total /day without increase in skin issues & no limb pain. Baseline: SEE OBJECTIVE DATA Goal status: INITIAL   3.  Patient able to reach 5" and look over both shoulders with RW support with supervision. Baseline: SEE OBJECTIVE DATA Goal status: INITIAL   4. Patient ambulates 58' with RW & prosthesis with minA. Baseline: SEE OBJECTIVE DATA Goal status: INITIAL       LONG TERM GOALS: Target date: 08/27/2022   Patient demonstrates & verbalized understanding of prosthetic care to enable safe utilization of prosthesis. Baseline: SEE OBJECTIVE DATA Goal status: INITIAL   Patient tolerates prosthesis wear >90% of awake hours without skin or limb pain issues. Baseline: SEE OBJECTIVE DATA Goal status: INITIAL   Berg Balance >/= 36/56 to indicate lower fall risk Baseline: SEE OBJECTIVE DATA Goal status: INITIAL   Patient ambulates >300' with prosthesis & LRAD independently Baseline: SEE OBJECTIVE DATA Goal status: INITIAL   Patient negotiates ramps, curbs & stairs with single rail with prosthesis & LRAD independently. Baseline: SEE OBJECTIVE DATA Goal status:  INITIAL     PLAN: PT FREQUENCY: 2x/week   PT DURATION: 10 weeks   PLANNED INTERVENTIONS: Therapeutic exercises, Therapeutic activity, Neuromuscular re-education, Balance training, Gait training, Patient/Family education, Self Care, Stair training, Vestibular training, Canalith repositioning, Prosthetic training, DME instructions, Vasopneumatic device, and physical performance testing   PLAN FOR NEXT SESSION: check STGs, check wound with wear to all awake hours. continue to progress Prosthetic gait with cane & HHA. standing balance.    Jamey Reas, PT, DPT 07/15/2022, 9:32 AM

## 2022-07-16 DIAGNOSIS — Z992 Dependence on renal dialysis: Secondary | ICD-10-CM | POA: Diagnosis not present

## 2022-07-16 DIAGNOSIS — N2581 Secondary hyperparathyroidism of renal origin: Secondary | ICD-10-CM | POA: Diagnosis not present

## 2022-07-16 DIAGNOSIS — N186 End stage renal disease: Secondary | ICD-10-CM | POA: Diagnosis not present

## 2022-07-17 ENCOUNTER — Other Ambulatory Visit: Payer: Self-pay | Admitting: Cardiovascular Disease

## 2022-07-17 DIAGNOSIS — I4891 Unspecified atrial fibrillation: Secondary | ICD-10-CM

## 2022-07-18 DIAGNOSIS — Z992 Dependence on renal dialysis: Secondary | ICD-10-CM | POA: Diagnosis not present

## 2022-07-18 DIAGNOSIS — N2581 Secondary hyperparathyroidism of renal origin: Secondary | ICD-10-CM | POA: Diagnosis not present

## 2022-07-18 DIAGNOSIS — N186 End stage renal disease: Secondary | ICD-10-CM | POA: Diagnosis not present

## 2022-07-20 ENCOUNTER — Encounter: Payer: BC Managed Care – PPO | Admitting: Physical Therapy

## 2022-07-20 ENCOUNTER — Ambulatory Visit (INDEPENDENT_AMBULATORY_CARE_PROVIDER_SITE_OTHER): Payer: BC Managed Care – PPO | Admitting: Orthopedic Surgery

## 2022-07-20 ENCOUNTER — Encounter: Payer: Self-pay | Admitting: Orthopedic Surgery

## 2022-07-20 DIAGNOSIS — L97911 Non-pressure chronic ulcer of unspecified part of right lower leg limited to breakdown of skin: Secondary | ICD-10-CM | POA: Diagnosis not present

## 2022-07-20 DIAGNOSIS — Z89511 Acquired absence of right leg below knee: Secondary | ICD-10-CM

## 2022-07-20 NOTE — Progress Notes (Signed)
Office Visit Note   Patient: Jeffrey Ayala           Date of Birth: 1961/07/29           MRN: [id number] Visit Date: 07/20/2022              Requested by: [name], NP-C [place],  [place] [address] PCP: [name], NP-C  Chief Complaint  Patient presents with   Right Foot - Wound Check    03/15/22 right BKA      HPI: Patient is a 61 year old gentleman who is 4 months out from right transtibial amputation.  Patient states he started therapy on Wednesday and had pain over the residual limb.  Patient states that Thursday he noticed the incision was opening up.  Assessment & Plan: Visit Diagnoses:  1. Hx of below knee amputation, right ([place])   2. Ulcer of right lower extremity, limited to breakdown of skin ([place])     Plan: Discussed how to apply the silicone liner to relieve pressure from the incision.  At follow-up patient will bring the prosthesis so we can check the foot.  Follow-Up Instructions: Return in about 2 weeks (around 08/03/2022).   Ortho Exam  Patient is alert, oriented, no adenopathy, well-dressed, normal affect, normal respiratory effort. Examination there is no redness no cellulitis there is good hair growth in the leg.  Patient has slight wound opening along the surgical incision.  There is healthy granulation tissue at the base no exposed bone or tendon no tunneling.  The wound is 2 x 10 mm.  Imaging: No results found.   Labs: Lab Results  Component Value Date   HGBA1C 5.5 02/23/2022   ESRSEDRATE 20 (H) 02/22/2022   CRP 13.8 (H) 02/23/2022   LABURIC 8.5 (H) 05/27/2022   LABURIC 6.3 11/14/2018   LABURIC 7.4 04/10/2014   REPTSTATUS 04/05/2022 FINAL 03/16/2022   GRAMSTAIN  03/16/2022    NO WBC SEEN ABUNDANT GRAM POSITIVE COCCI FEW GRAM NEGATIVE RODS    CULT  03/16/2022    ABUNDANT ENTEROCOCCUS FAECALIS FEW PSEUDOMONAS AERUGINOSA MULTI-DRUG RESISTANT ORGANISM ABUNDANT BACTEROIDES OVATUS BETA LACTAMASE  POSITIVE CRITICAL RESULT CALLED TO, READ BACK BY AND VERIFIED WITH: PHARMD K PIERCE [id number] AT 1019 BY CM Sent to [facility] for further susceptibility testing. SEE REPORT IN EPIC Performed at [facility], [place] [address] [place], [place] [address]    [facility] ENTEROCOCCUS FAECALIS 03/16/2022   LABORGA PSEUDOMONAS AERUGINOSA 03/16/2022     Lab Results  Component Value Date   ALBUMIN 4.0 06/09/2022   ALBUMIN 4.2 05/27/2022   ALBUMIN 3.6 04/18/2022    Lab Results  Component Value Date   MG 1.9 04/18/2022   MG 2.1 04/01/2022   MG 2.0 03/31/2022   No results found for: "VD25OH"  No results found for: "PREALBUMIN"    Latest Ref Rng & Units 06/09/2022   12:07 PM 04/27/2022    2:25 PM 04/20/2022    5:01 AM  CBC EXTENDED  WBC 4.0 - 10.5 K/uL 10.4  7.7  7.3   RBC 4.22 - 5.81 Mil/uL 2.29  2.69  2.52   Hemoglobin 13.0 - 17.0 g/dL 7.4 Repeated and verified X2.  8.3  7.9   HCT 39.0 - 52.0 % 21.6 Repeated and verified X2.  25.2  24.0   Platelets 150.0 - 400.0 K/uL 240.0  281  216   NEUT# 1.4 - 7.7 K/uL 8.4     Lymph# 0.7 - 4.0  K/uL 1.1        There is no height or weight on file to calculate BMI.  Orders:  No orders of the defined types were placed in this encounter.  No orders of the defined types were placed in this encounter.    Procedures: No procedures performed  Clinical Data: No additional findings.  ROS:  All other systems negative, except as noted in the HPI. Review of Systems  Objective: Vital Signs: There were no vitals taken for this visit.  Specialty Comments:  No specialty comments available.  PMFS History: Patient Active Problem List   Diagnosis Date Noted   Suicidal ideations 06/09/2022   Chills (without fever) 06/09/2022   Severe depression ([place]) 06/09/2022   Acute cough 06/09/2022   Reactive depression 05/31/2022   Belching 05/27/2022   Gastroesophageal reflux disease 05/27/2022   Atrial fibrillation ([place]) 05/20/2022   Long term  (current) use of anticoagulants 05/20/2022   S/P transmetatarsal amputation of foot, left ([place]) 04/18/2022   CKD (chronic kidney disease) stage 5, GFR less than 15 ml/min ([place]) 04/18/2022   Secondary hyperparathyroidism of renal origin ([place]) 04/10/2022   Pruritus, unspecified 04/10/2022   Nausea 04/10/2022   Iron deficiency anemia, unspecified 04/10/2022   Hypothyroidism, unspecified 04/10/2022   Other acute kidney failure ([place]) 04/10/2022   Dyspnea, unspecified 05/18/9484   Eosinophilic esophagitis 46/27/0350   Gastric ulcer, unspecified as acute or chronic, without hemorrhage or perforation 04/10/2022   Coagulation defect, unspecified ([place]) 04/10/2022   Allergy, unspecified, initial encounter 04/10/2022   Anaphylactic shock, unspecified, initial encounter 04/10/2022   Arthralgia of right lower leg    Acute blood loss anemia    Unilateral complete BKA, right, initial encounter ([place]) 04/03/2022   S/P BKA (below knee amputation), right ([place]) 04/03/2022   Old myocardial infarction 04/02/2022   Dependence on renal dialysis ([place]) 04/02/2022   Acquired absence of right leg below knee ([place]) 04/02/2022   Cirrhosis ([place]) 03/31/2022   Fournier's gangrene in male    Scrotal infection 03/25/2022   Normocytic anemia 03/25/2022   Diarrhea 03/25/2022   Shock liver 03/25/2022   Acute respiratory failure with hypoxia ([place]) 03/25/2022   Gout 03/25/2022   Acute nonintractable headache 03/25/2022   Demand ischemia ([place]) 03/25/2022   Thrombocytopenia ([place]) 03/25/2022   Obesity, Class III, BMI 40-49.9 (morbid obesity) ([place]) 03/25/2022   Cardiac arrest ([place]) 03/25/2022   Hypotension    Respiratory insufficiency    Cellulitis    Malnutrition of moderate degree 03/12/2022   Altered mental status    Gangrene of right foot ([place])    Gangrene of left foot ([place])    Severe protein-calorie malnutrition ([place]) 03/10/2022   Encounter for central line placement    Pressure injury of skin 02/23/2022    Septic shock ([place]) 02/22/2022   AKI (acute kidney injury) ([place])    Generalized abdominal pain    Lactic acidosis    Atrial fibrillation with RVR ([name])    Hypokalemia    Hypomagnesemia    Pain in finger of right hand 08/14/2021   Degenerative joint disease of shoulder region 05/07/2020   Olecranon bursitis of right elbow 05/07/2020   Pain in joint of right shoulder 04/26/2020   S/P TKR (total knee replacement), bilateral 11/04/2018   Bilateral primary osteoarthritis of knee    DDD (degenerative disc disease), cervical 01/10/2018   Rupture of medial head of gastrocnemius, left, sequela 01/14/2017   Kidney stones 06/22/2016   Essential hypertension 08/01/2010   DYSTHYMIC DISORDER 07/08/2010  PALPITATIONS 07/08/2010   Past Medical History:  Diagnosis Date   Concussion    Eosinophilic esophagitis    EGD/bx by Dr. Havery Moros   Gastric ulcer    History of kidney stones    Intermittent dysphagia    Migraine     Family History  Problem Relation Age of Onset   Arrhythmia Mother    COPD Mother    Sarcoidosis Brother    Cancer Brother    Stomach cancer Neg Hx    Pancreatic cancer Neg Hx    Esophageal cancer Neg Hx    Colon cancer Neg Hx    Rectal cancer Neg Hx     Past Surgical History:  Procedure Laterality Date   AMPUTATION Right 03/15/2022   Procedure: RIGHT BELOW KNEE AMPUTATION;  Surgeon: [name], MD;  Location: [facility];  Service: Orthopedics;  Laterality: Right;   CYSTOSCOPY     DENTAL SURGERY     FINGER SURGERY     HERNIA REPAIR     at age 17   IR FLUORO GUIDE CV LINE RIGHT  03/09/2022   IR PATIENT EVAL TECH 0-60 MINS  03/09/2022   IR US GUIDE VASC ACCESS RIGHT  03/09/2022   KNEE ARTHROSCOPY Left    LITHOTRIPSY     NEPHROLITHOTOMY     SCROTAL EXPLORATION N/A 03/16/2022   Procedure: IRRIGATION AND DEBRIDEMENT SCROTUM;  Surgeon: Raynelle Bring, MD;  Location: [facility];  Service: Urology;  Laterality: N/A;   SCROTAL EXPLORATION N/A 03/28/2022   Procedure: IRRIGATION AND  DEBRIDEMENT SCROTUM;  Surgeon: Ardis Hughs, MD;  Location: [facility];  Service: Urology;  Laterality: N/A;   TONSILLECTOMY     TOTAL KNEE ARTHROPLASTY Bilateral 11/04/2018   Procedure: BILATERAL TOTAL KNEE ARTHROPLASTY;  Surgeon: [name], MD;  Location: [facility];  Service: Orthopedics;  Laterality: Bilateral;  spinal/epidural per anesthesiologist   TRANSMETATARSAL AMPUTATION Left 03/15/2022   Procedure: LEFT TRANSMETATARSAL AMPUTATION;  Surgeon: [name], MD;  Location: [facility];  Service: Orthopedics;  Laterality: Left;   Social History   Occupational History   Occupation: Airline pilot  Tobacco Use   Smoking status: Never   Smokeless tobacco: Never  Vaping Use   Vaping Use: Never used  Substance and Sexual Activity   Alcohol use: Not Currently    Comment: occassional   Drug use: No   Sexual activity: Not Currently

## 2022-07-21 DIAGNOSIS — Z992 Dependence on renal dialysis: Secondary | ICD-10-CM | POA: Diagnosis not present

## 2022-07-21 DIAGNOSIS — N2581 Secondary hyperparathyroidism of renal origin: Secondary | ICD-10-CM | POA: Diagnosis not present

## 2022-07-21 DIAGNOSIS — N186 End stage renal disease: Secondary | ICD-10-CM | POA: Diagnosis not present

## 2022-07-22 ENCOUNTER — Ambulatory Visit (INDEPENDENT_AMBULATORY_CARE_PROVIDER_SITE_OTHER): Payer: BC Managed Care – PPO | Admitting: Physical Therapy

## 2022-07-22 ENCOUNTER — Encounter: Payer: Self-pay | Admitting: Physical Therapy

## 2022-07-22 DIAGNOSIS — M21372 Foot drop, left foot: Secondary | ICD-10-CM

## 2022-07-22 DIAGNOSIS — L98498 Non-pressure chronic ulcer of skin of other sites with other specified severity: Secondary | ICD-10-CM | POA: Diagnosis not present

## 2022-07-22 DIAGNOSIS — R2681 Unsteadiness on feet: Secondary | ICD-10-CM

## 2022-07-22 DIAGNOSIS — M6281 Muscle weakness (generalized): Secondary | ICD-10-CM

## 2022-07-22 DIAGNOSIS — R609 Edema, unspecified: Secondary | ICD-10-CM

## 2022-07-22 DIAGNOSIS — R2689 Other abnormalities of gait and mobility: Secondary | ICD-10-CM

## 2022-07-22 NOTE — Therapy (Signed)
OUTPATIENT PHYSICAL THERAPY TREATMENT NOTE   Patient Name: Jeffrey Ayala MRN: [id number] DOB:[birth year], 61 y.o., male Today's Date: 07/22/2022  PCP: [name], NP-C REFERRING PROVIDER: [name], MD  END OF SESSION:   PT End of Session - 07/22/22 1518     Visit Number 9    Number of Visits 20    Authorization Type [employer]BS comm PPO    Authorization Time Period 20 visit limit PT/OT/chiro, $35 co-pay    Authorization - Visit Number 9    Authorization - Number of Visits 20    Progress Note Due on Visit 10    PT Start Time 1518    PT Stop Time 1544    PT Time Calculation (min) 26 min    Equipment Utilized During Treatment Gait belt    Activity Tolerance Patient tolerated treatment well;Patient limited by fatigue    Behavior During Therapy WFL for tasks assessed/performed;Flat affect                    Past Medical History:  Diagnosis Date   Concussion    Eosinophilic esophagitis    EGD/bx by Dr. Havery Moros   Gastric ulcer    History of kidney stones    Intermittent dysphagia    Migraine    Past Surgical History:  Procedure Laterality Date   AMPUTATION Right 03/15/2022   Procedure: RIGHT BELOW KNEE AMPUTATION;  Surgeon: [name], MD;  Location: [place];  Service: Orthopedics;  Laterality: Right;   CYSTOSCOPY     DENTAL SURGERY     FINGER SURGERY     HERNIA REPAIR     at age 89   IR FLUORO GUIDE CV LINE RIGHT  03/09/2022   IR PATIENT EVAL TECH 0-60 MINS  03/09/2022   IR US GUIDE VASC ACCESS RIGHT  03/09/2022   KNEE ARTHROSCOPY Left    LITHOTRIPSY     NEPHROLITHOTOMY     SCROTAL EXPLORATION N/A 03/16/2022   Procedure: IRRIGATION AND DEBRIDEMENT SCROTUM;  Surgeon: Raynelle Bring, MD;  Location: [facility];  Service: Urology;  Laterality: N/A;   SCROTAL EXPLORATION N/A 03/28/2022   Procedure: IRRIGATION AND DEBRIDEMENT SCROTUM;  Surgeon: Ardis Hughs, MD;  Location: [facility];  Service: Urology;  Laterality: N/A;   TONSILLECTOMY     TOTAL KNEE  ARTHROPLASTY Bilateral 11/04/2018   Procedure: BILATERAL TOTAL KNEE ARTHROPLASTY;  Surgeon: [name], MD;  Location: [place];  Service: Orthopedics;  Laterality: Bilateral;  spinal/epidural per anesthesiologist   TRANSMETATARSAL AMPUTATION Left 03/15/2022   Procedure: LEFT TRANSMETATARSAL AMPUTATION;  Surgeon: [name], MD;  Location: [place];  Service: Orthopedics;  Laterality: Left;   Patient Active Problem List   Diagnosis Date Noted   Suicidal ideations 06/09/2022   Chills (without fever) 06/09/2022   Severe depression ([place]) 06/09/2022   Acute cough 06/09/2022   Reactive depression 05/31/2022   Belching 05/27/2022   Gastroesophageal reflux disease 05/27/2022   Atrial fibrillation ([place]) 05/20/2022   Long term (current) use of anticoagulants 05/20/2022   S/P transmetatarsal amputation of foot, left ([place]) 04/18/2022   CKD (chronic kidney disease) stage 5, GFR less than 15 ml/min ([place]) 04/18/2022   Secondary hyperparathyroidism of renal origin ([place]) 04/10/2022   Pruritus, unspecified 04/10/2022   Nausea 04/10/2022   Iron deficiency anemia, unspecified 04/10/2022   Hypothyroidism, unspecified 04/10/2022   Other acute kidney failure ([place]) 04/10/2022   Dyspnea, unspecified 37/08/6268   Eosinophilic esophagitis 48/54/6270   Gastric ulcer, unspecified as  acute or chronic, without hemorrhage or perforation 04/10/2022   Coagulation defect, unspecified ([place]) 04/10/2022   Allergy, unspecified, initial encounter 04/10/2022   Anaphylactic shock, unspecified, initial encounter 04/10/2022   Arthralgia of right lower leg    Acute blood loss anemia    Unilateral complete BKA, right, initial encounter ([place]) 04/03/2022   S/P BKA (below knee amputation), right ([place]) 04/03/2022   Old myocardial infarction 04/02/2022   Dependence on renal dialysis ([place]) 04/02/2022   Acquired absence of right leg below knee ([place]) 04/02/2022   Cirrhosis ([place]) 03/31/2022   Fournier's gangrene in male     Scrotal infection 03/25/2022   Normocytic anemia 03/25/2022   Diarrhea 03/25/2022   Shock liver 03/25/2022   Acute respiratory failure with hypoxia ([place]) 03/25/2022   Gout 03/25/2022   Acute nonintractable headache 03/25/2022   Demand ischemia ([place]) 03/25/2022   Thrombocytopenia ([place]) 03/25/2022   Obesity, Class III, BMI 40-49.9 (morbid obesity) ([place]) 03/25/2022   Cardiac arrest ([place]) 03/25/2022   Hypotension    Respiratory insufficiency    Cellulitis    Malnutrition of moderate degree 03/12/2022   Altered mental status    Gangrene of right foot ([place])    Gangrene of left foot ([place])    Severe protein-calorie malnutrition ([place]) 03/10/2022   Encounter for central line placement    Pressure injury of skin 02/23/2022   Septic shock ([place]) 02/22/2022   AKI (acute kidney injury) ([place])    Generalized abdominal pain    Lactic acidosis    Atrial fibrillation with RVR ([name])    Hypokalemia    Hypomagnesemia    Pain in finger of right hand 08/14/2021   Degenerative joint disease of shoulder region 05/07/2020   Olecranon bursitis of right elbow 05/07/2020   Pain in joint of right shoulder 04/26/2020   S/P TKR (total knee replacement), bilateral 11/04/2018   Bilateral primary osteoarthritis of knee    DDD (degenerative disc disease), cervical 01/10/2018   Rupture of medial head of gastrocnemius, left, sequela 01/14/2017   Kidney stones 06/22/2016   Essential hypertension 08/01/2010   DYSTHYMIC DISORDER 07/08/2010   PALPITATIONS 07/08/2010    REFERRING DIAG: Z89.511 (ICD-10-CM) - Hx of below knee amputation, right   ONSET DATE: 06/19/2022 Prosthesis delivery  THERAPY DIAG:  Other abnormalities of gait and mobility  Muscle weakness (generalized)  Unsteadiness on feet  Non-pressure chronic ulcer of skin of other sites with other specified severity ([name])  Edema, unspecified type  Foot drop, left  Rationale for Evaluation and Treatment Rehabilitation  PERTINENT HISTORY: Rt BKA  03/15/22, Left MTA 03/15/2022, bil. TKA 11/04/18, ESRD / chronic kidney disease st5 dialysis Tues/Thurs/Sat, cirrhosis, cardiac arrest May 23 post intubation, A-Fib  PRECAUTIONS: Fall  SUBJECTIVE:  He developed a wound on lateral incision after last PT appt on 07/15/22. He saw [name] on 07/20/22 who said to angle pin slightly posterior.  Pt has not worn prosthesis since dialysis on Thursday until today. Pt reports pain is more in distal limb no directly under the wound.    PAIN:  Are you having pain? Distal residual limb 7-8/10 donning liner & standing with prosthesis.  1-2/10 with shrinker sock on limb.     OBJECTIVE: (objective measures completed at initial evaluation unless otherwise dated)  OBJECTIVE:    COGNITION: 06/22/2022  Overall cognitive status: Within functional limits for tasks assessed and pt had flat affect so unable to fully assess cognition.   POSTURE: 06/22/2022  rounded shoulders, forward head, flexed trunk , and weight  shift left   LOWER EXTREMITY ROM:   ROM P:passive  A:active Right Eval 06/22/2022 Left Eval 06/22/2022  Hip flexion      Hip extension Standing A: ~30* limitation Standing A: ~30* limitation  Hip abduction      Hip adduction      Hip internal rotation      Hip external rotation      Knee flexion      Knee extension Standing A: ~15* limitation Standing A: ~15* limitation  Ankle dorsiflexion   Standing A: ~25* limitation  Ankle plantarflexion      Ankle inversion      Ankle eversion       (Blank rows = not tested)   LOWER EXTREMITY MMT:   MMT Right Eval 06/22/2022 Left Eval 06/22/2022  Hip flexion 4/5 4/5  Hip extension 2/5 functional 2/5 functional  Hip abduction 2/5 functional 2/5 functional  Hip adduction      Hip internal rotation      Hip external rotation      Knee flexion      Knee extension 3-/5 functional 3-/5 functional  Ankle dorsiflexion 2-/5    Ankle plantarflexion      Ankle inversion      Ankle eversion       (Blank rows = not tested)   TRANSFERS: 06/22/2022  Sit to stand: Mod A and from 22" w/c requiring armrests to push & RW to stabilize 06/22/2022  Stand to sit: Min A from RW to 22" w/c unable to release RW to reach for w/c until seated.   GAIT: 06/22/2022 Gait pattern: step to pattern, decreased step length- Left, decreased stance time- Right, decreased stride length, decreased hip/knee flexion- Right, circumduction- Right, Right hip hike, knee flexed in stance- Right, knee flexed in stance- Left, antalgic, trunk flexed, abducted- Right, and poor foot clearance- Left excessive weightbearing BUEs on RW Distance walked: 5' Assistive device utilized: Environmental consultant - 2 wheeled and right TTA prosthesis / left plate & shoe filler Level of assistance: Mod A Comments: post gait HR 82, SpO2 99% with dyspnea noted   FUNCTIONAL TESTs:  06/22/2022  Static standing balance with BUE support on RW for 1 min with minA.     CURRENT PROSTHETIC WEAR ASSESSMENT: 06/22/2022 Patient is dependent with: skin check, residual limb care, care of non-amputated limb, prosthetic cleaning, ply sock cleaning, correct ply sock adjustment, proper wear schedule/adjustment, and proper weight-bearing schedule/adjustment Donning prosthesis: Total A and max cues required, Doffing prosthesis: Total A and wife able to doffe Prosthetic wear tolerance: 1-2 hours 1-2x/day, for 3 of 3 days since delivery.  Prosthetic weight bearing tolerance: 0 minutes unable to stand outside of PT Edema: pitting with >5sec capillary refill Residual limb condition: 43m wound with serous drainage & black scab present,  calf measurement is 42.2cm Prosthetic description:  silicon liner with pin suspension, flexible inner socket Total Contact design, dynamic response foot with hydraulic PF/DF K code/activity level with prosthetic use: Level 3     TODAY'S TREATMENT:  07/22/2022 Prosthetic Training with TTA prosthesis: Wound on lateral incision 122mX [id number]  scab covering. Pain in limb distal to wound with slight pressure.  He has severe pain with light palpation to distal limb more laterally.   PT spoke with Dr. DudSharol Giveno agreed that a diagnostic US [place] indicated to rule an abscess or internal issue.   EriDondra Ayala came to PT clinic and took scab from wound with no noted tunneling. She also agreed  that internal issue may be present & scheduled diagnostic US with Dr. Rolena Infante tomorrow morning prior to dialysis.   PT recommended wear shrinker with Vive Wear shrinker against skin due to wound.  If no issue is found with diagnostic US, then resume wearing prosthesis but limit weight bearing until he sees PT next week. Pt & wife verbalized understanding.    07/15/2022 Neuromuscular Re-ed for standing balance Standing in corner with RW in front for safety:  Standing on floor eyes open feet ~4-5" apart head turns right/left, up/down & 2 diagonals with intermittent touch RW & walls  Standing on floor eyes closed feet ~6-7" apart head turns right/left, up/down & 2 diagonals with light touch on side of RW  Standing on foam eyes open feet ~6-7" apart head turns right/left, up/down & 2 diagonals with light touch on side of RW PT educated on above as HEP including safe set-up & wife assisting. Pt & wife verbalized understanding. PT reviewed doing HEP at sink, standing with back to door frame, above HEP & progressive walking program (short increasing frequency, long / max tolerable with wife following with w/c 1-2x/day & medium bw short & long distance 4+/day).  Breaking up each group during day which will be very limited on dialysis days to build overall stamina for standing & walking.  Pt & wife verbalized understanding.  Prosthetic Care with right TTA prosthesis & left TMA shoe filler with carbon plate: Continue wear all awake hours and switching underliner every 4 hours.  Pt & wife verbalized understanding. Pt ambulated 26' with cane stand alone tip & HHA with  minA. Tactile & verbal cues on sequence, upright posture, balance reactions & wt shift over stance LE.   07/13/2022 Prosthetic Care with right TTA prosthesis & left TMA shoe filler with carbon plate: No skin changes with wound scab is no longer over moist. PT recommended to continue underliner but can trim upper band if pressure over tibial plateau is an issue. Recommend wear all awake hours and switching underliner every 4 hours.  Pt & wife verbalized understanding.  PT demo & verbal cues on sequence and technique for gait with cane stand alone tip.  Pt amb around table RUE support and cane LUE with min guard initial 20' and supervision 100'.  Pt & wife to look for cane to purchase.    Therapeutic Activities: Sit to / from stand from 24" bar stool with hands on thighs and goal to not touch //bars to stabilize, 5 reps with minA Step up & step downs 4" step 3 reps BLEs using BUEs //bars with min guard. PT demo & verbal cues on technique.   Neuromuscular Re-education:  Standing balance on floor eyes open head movements right/left, up/down & 2 diagonals using mirror for upright posture bw ea motion for 5 reps ea direction with minA. Static stance on floor eyes closed 3 reps up to 20 sec with min guard. Then head turns eyes closed with modA.  Static stance on foam eyes open 3 reps up to 11 sec with min guard. Then head turns eyes open with modA.  Static stance on foam eyes closed with minA 5 sec.    HOME EXERCISE PROGRAM:  AT [facility] FIND YOUR MIDLINE POSITION AND PLACE FEET EQUAL DISTANCE FROM THE MIDLINE.  Try to find this position when standing still for activities.   USE TAPE ON FLOOR TO MARK THE MIDLINE POSITION which is even with middle of sink.  You also should try to feel  with your limb pressure in socket.  You are trying to feel with limb what you used to feel with the bottom of your foot.  Side to Side Shift: Moving your hips only (not shoulders): move weight onto your left leg, HOLD/FEEL  pressure in socket.  Move back to equal weight on each leg, HOLD/FEEL pressure in socket. Move weight onto your right leg, HOLD/FEEL pressure in socket. Move back to equal weight on each leg, HOLD/FEEL pressure in socket. Repeat.  Start with both hands on sink, progress to hand on prosthetic side only, then no hands.  Front to Back Shift: Moving your hips only (not shoulders): move your weight forward onto your toes, HOLD/FEEL pressure in socket. Move your weight back to equal Flat Foot on both legs, HOLD/FEEL  pressure in socket. Move your weight back onto your heels, HOLD/FEEL  pressure in socket. Move your weight back to equal on both legs, HOLD/FEEL  pressure in socket. Repeat.  Start with both hands on sink, progress to hand on prosthetic side only, then no hands.  Moving Cones / Cups: With equal weight on each leg: Hold on with one hand the first time, then progress to no hand supports. Move cups from one side of sink to the other. Place cups ~2" out of your reach, progress to 10" beyond reach.  Place one hand in middle of sink and reach with other hand. Do both arms.  Then hover one hand and move cups with other hand.  Overhead/Upward Reaching: alternated reaching up to top cabinets or ceiling if no cabinets present. Keep equal weight on each leg. Start with one hand support on counter while other hand reaches and progress to no hand support with reaching.  ace one hand in middle of sink and reach with other hand. Do both arms.  Then hover one hand and move cups with other hand.  5.   Looking Over Shoulders: With equal weight on each leg: alternate turning to look over your shoulders with one hand support on counter as needed.  Start with head motions only to look in front of shoulder, then even with shoulder and progress to looking behind you. To look to side, move head /eyes, then shoulder on side looking pulls back, shift more weight to side looking and pull hip back. Place one hand in middle of sink  and let go with other hand so your shoulder can pull back. Switch hands to look other way.   Then hover one hand and look over shoulder. If looking right, use left hand at sink. If looking left, use right hand at sink. 6.  Stepping with leg that is not amputated:  Move items under cabinet out of your way. Shift your hips/pelvis so weight on prosthesis. Tighten muscles in hip on prosthetic side.  SLOWLY step other leg so front of foot is in cabinet. Then step back to floor.   Pt also recommending sitting at edge of w/c with feet supported on floor but no back support each hour in w/c until back fatigues. PT also demo & instructed in sitting on 24" stool. Pt & wife verbalized understanding.     ASSESSMENT:   CLINICAL IMPRESSION: Patient has painful area on distal limb that could be potentially an internal issue.  He would benefit from diagnostic US to rule out an issue before continuing with prosthetic training.    OBJECTIVE IMPAIRMENTS Abnormal gait, cardiopulmonary status limiting activity, decreased activity tolerance, decreased balance, decreased endurance, decreased knowledge of  condition, decreased knowledge of use of DME, decreased mobility, difficulty walking, decreased ROM, decreased strength, increased edema, impaired flexibility, postural dysfunction, and prosthetic dependency .    ACTIVITY LIMITATIONS carrying, lifting, bending, sitting, standing, squatting, stairs, transfers, locomotion level, and prosthesis use   PARTICIPATION LIMITATIONS: driving, community activity, occupation, yard work, and recreational of camping   [place], Past/current experiences, Time since onset of injury/illness/exacerbation, and 3+ comorbidities: see PMH  are also affecting patient's functional outcome.    REHAB POTENTIAL: Good   CLINICAL DECISION MAKING: Evolving/moderate complexity   EVALUATION COMPLEXITY: Moderate     GOALS: Goals reviewed with patient? Yes   SHORT TERM GOALS:  Target date: 07/22/2022   Patient donnes prosthesis modified independent & verbalizes proper cleaning. Baseline: SEE OBJECTIVE DATA Goal status: INITIAL 2.  Patient tolerates prosthesis >10 hrs total /day without increase in skin issues & no limb pain. Baseline: SEE OBJECTIVE DATA Goal status: INITIAL   3.  Patient able to reach 5" and look over both shoulders with RW support with supervision. Baseline: SEE OBJECTIVE DATA Goal status: INITIAL   4. Patient ambulates 1' with RW & prosthesis with minA. Baseline: SEE OBJECTIVE DATA Goal status: INITIAL       LONG TERM GOALS: Target date: 08/27/2022   Patient demonstrates & verbalized understanding of prosthetic care to enable safe utilization of prosthesis. Baseline: SEE OBJECTIVE DATA Goal status: INITIAL   Patient tolerates prosthesis wear >90% of awake hours without skin or limb pain issues. Baseline: SEE OBJECTIVE DATA Goal status: INITIAL   Berg Balance >/= 36/56 to indicate lower fall risk Baseline: SEE OBJECTIVE DATA Goal status: INITIAL   Patient ambulates >300' with prosthesis & LRAD independently Baseline: SEE OBJECTIVE DATA Goal status: INITIAL   Patient negotiates ramps, curbs & stairs with single rail with prosthesis & LRAD independently. Baseline: SEE OBJECTIVE DATA Goal status: INITIAL     PLAN: PT FREQUENCY: 2x/week   PT DURATION: 10 weeks   PLANNED INTERVENTIONS: Therapeutic exercises, Therapeutic activity, Neuromuscular re-education, Balance training, Gait training, Patient/Family education, Self Care, Stair training, Vestibular training, Canalith repositioning, Prosthetic training, DME instructions, Vasopneumatic device, and physical performance testing   PLAN FOR NEXT SESSION: check outcome of diagnostic US, check STGs, check wound with wear to all awake hours. continue to progress Prosthetic gait with cane & HHA. standing balance.    Jamey Reas, PT, DPT 07/22/2022, 4:04 PM

## 2022-07-23 ENCOUNTER — Encounter: Payer: Self-pay | Admitting: Sports Medicine

## 2022-07-23 ENCOUNTER — Ambulatory Visit (INDEPENDENT_AMBULATORY_CARE_PROVIDER_SITE_OTHER): Payer: BC Managed Care – PPO | Admitting: Sports Medicine

## 2022-07-23 ENCOUNTER — Ambulatory Visit: Payer: Self-pay

## 2022-07-23 VITALS — BP 107/67 | HR 101 | Ht 70.0 in | Wt 194.0 lb

## 2022-07-23 DIAGNOSIS — N186 End stage renal disease: Secondary | ICD-10-CM

## 2022-07-23 DIAGNOSIS — Z89511 Acquired absence of right leg below knee: Secondary | ICD-10-CM | POA: Diagnosis not present

## 2022-07-23 DIAGNOSIS — L089 Local infection of the skin and subcutaneous tissue, unspecified: Secondary | ICD-10-CM | POA: Diagnosis not present

## 2022-07-23 DIAGNOSIS — N2581 Secondary hyperparathyroidism of renal origin: Secondary | ICD-10-CM | POA: Diagnosis not present

## 2022-07-23 DIAGNOSIS — M79661 Pain in right lower leg: Secondary | ICD-10-CM

## 2022-07-23 DIAGNOSIS — Z992 Dependence on renal dialysis: Secondary | ICD-10-CM | POA: Diagnosis not present

## 2022-07-23 DIAGNOSIS — N170 Acute kidney failure with tubular necrosis: Secondary | ICD-10-CM | POA: Diagnosis not present

## 2022-07-23 MED ORDER — AMOXICILLIN-POT CLAVULANATE 875-125 MG PO TABS: 1.0000 | ORAL_TABLET | Freq: Two times a day (BID) | ORAL | 0 refills | Status: AC

## 2022-07-23 NOTE — Progress Notes (Signed)
Office Visit Note   Patient: Jeffrey Ayala           Date of Birth: 1961-07-24           MRN: [id number] Visit Date: 07/23/2022              Requested by: [name], NP-C [place],  [place] [address] PCP: [name], NP-C   Assessment & Plan: Visit Diagnoses:  1. Hx of below knee amputation, right ([place])   2. Soft tissue infection   3. Pain in right lower leg    Plan: I had a discussion with Jeffrey Ayala regarding his amputation today.  Ultrasound evaluation does not show any true abscess formation.  There is some cobblestoning and hypoechoic fluid noted at the distal stump, which is suspicious for possible early soft tissue infection versus irritation from the prosthesis.  Given his risk factors and the hyperemia was noted on ultrasound did discuss that it was best to cover with Augmentin twice daily for the next 7 days.  He will hold on physical therapy at this time and try to stay off of the leg until his completion of antibiotics.  He may ice as well for pain control.  Did discuss with him and his wife strict return precautions and/or evaluation in the ED/urgent care if any surrounding redness, worsening in pain or drainage comes from the amputation site.  After he completes the antibiotic, he will represent for physical therapy and they may want to adjust his prosthesis and attempt to offload his pain with walking.  Follow-Up Instructions: Follow-up as needed    Orders:  Orders Placed This Encounter  Procedures   [place] Extrem Low Right Ltd   Meds ordered this encounter  Medications   amoxicillin-clavulanate (AUGMENTIN) 875-125 MG tablet    Sig: Take 1 tablet by mouth 2 (two) times daily for 7 days.    Dispense:  14 tablet    Refill:  0    Subjective: Chief Complaint  Patient presents with   Right Leg - Pain    HPI Jeffrey Ayala is a pleasant 61 year-old male who presents with his wife today for evaluation of right distal stump (RLE) pain.  He is about 4  months out from a right transtibial amputation.  He has been going to physical therapy to work on his gait and work with the prosthesis.  At yesterday's visit on 07/22/2022 he was having some increased pain at the amputation site.  This has been present for the last 1-2 weeks.  He denies any specific injury, although notes at this time he has been putting more pressure and using the limb at that time.  He did have a visit with [name] on 07/20/2022 and they do report he remove one of the skin scabs.  Pain is at the distal end of the amputation.  He denies any surrounding redness or swelling.  No fever or chills noted.  He has been taking it easy since the pain started and inquiring about stopping therapy for the current moment.  ROS: - no fever/chills - no SOB  Objective: Vital Signs: BP 107/67   Pulse (!) 101   Ht '5\' 10"'$  (1.778 m)   Wt 194 lb (88 kg)   BMI 27.84 kg/m   Physical Exam Gen: Well-appearing, in no acute distress; non-toxic CV: Well-perfused. Warm.  Resp: Breathing unlabored on room air; no wheezing. Psych: Fluid speech in conversation; appropriate affect; normal thought process Neuro:  Sensation intact throughout. No gross coordination deficits.   Ortho Exam  - RLE: Inspection of the right lower extremity shows a below-knee amputation with well-healed incisions.  There are 2 very small superficial scars noted.  The wound appears clean dry and intact.  There is no surrounding erythema.  There are some tenderness to palpation over the distal end of the amputation as well as just off the anterior aspect of the tibia.   Specialty Comments:  No specialty comments available.  Imaging: [place] Extrem Low Right Ltd  Result Date: 07/23/2022 [facility] Limited lower extremity ultrasound performed, right Both short and long axis evaluation of the right lower extremity (s/p BKA) was performed today.  Evaluation of the extremity stump noted mixed echogenic texture deep to the superficial  subcutaneous layer.  There is some scattered hypoechoic pockets, indicative of small amounts of fluid with cobblestoning present.  There is notable hyperemia surrounding this.  Evaluation of the anterior proximal tibial stump was noted without surrounding hypoechoic fluid or cortical irregularity.  I cannot visualize any pockets of walled off abscess.  No sonographic fluctuance noted.    Cobblestone noted in the soft tissue of the distal stump of the right BKA with hyperemia, suggestive of cellulitis without clear abscess formation.        PMFS History: Patient Active Problem List   Diagnosis Date Noted   Suicidal ideations 06/09/2022   Chills (without fever) 06/09/2022   Severe depression ([place]) 06/09/2022   Acute cough 06/09/2022   Reactive depression 05/31/2022   Belching 05/27/2022   Gastroesophageal reflux disease 05/27/2022   Atrial fibrillation ([place]) 05/20/2022   Long term (current) use of anticoagulants 05/20/2022   S/P transmetatarsal amputation of foot, left ([place]) 04/18/2022   CKD (chronic kidney disease) stage 5, GFR less than 15 ml/min ([place]) 04/18/2022   Secondary hyperparathyroidism of renal origin ([place]) 04/10/2022   Pruritus, unspecified 04/10/2022   Nausea 04/10/2022   Iron deficiency anemia, unspecified 04/10/2022   Hypothyroidism, unspecified 04/10/2022   Other acute kidney failure ([place]) 04/10/2022   Dyspnea, unspecified 60/08/9322   Eosinophilic esophagitis 55/73/2202   Gastric ulcer, unspecified as acute or chronic, without hemorrhage or perforation 04/10/2022   Coagulation defect, unspecified ([place]) 04/10/2022   Allergy, unspecified, initial encounter 04/10/2022   Anaphylactic shock, unspecified, initial encounter 04/10/2022   Arthralgia of right lower leg    Acute blood loss anemia    Unilateral complete BKA, right, initial encounter ([place]) 04/03/2022   S/P BKA (below knee amputation), right ([place]) 04/03/2022   Old myocardial infarction 04/02/2022   Dependence  on renal dialysis ([place]) 04/02/2022   Acquired absence of right leg below knee ([place]) 04/02/2022   Cirrhosis ([place]) 03/31/2022   Fournier's gangrene in male    Scrotal infection 03/25/2022   Normocytic anemia 03/25/2022   Diarrhea 03/25/2022   Shock liver 03/25/2022   Acute respiratory failure with hypoxia ([place]) 03/25/2022   Gout 03/25/2022   Acute nonintractable headache 03/25/2022   Demand ischemia ([place]) 03/25/2022   Thrombocytopenia ([place]) 03/25/2022   Obesity, Class III, BMI 40-49.9 (morbid obesity) ([place]) 03/25/2022   Cardiac arrest ([place]) 03/25/2022   Hypotension    Respiratory insufficiency    Cellulitis    Malnutrition of moderate degree 03/12/2022   Altered mental status    Gangrene of right foot ([place])    Gangrene of left foot ([place])    Severe protein-calorie malnutrition ([place]) 03/10/2022   Encounter for central line placement    Pressure injury of skin 02/23/2022  Septic shock ([place]) 02/22/2022   AKI (acute kidney injury) ([place])    Generalized abdominal pain    Lactic acidosis    Atrial fibrillation with RVR ([name])    Hypokalemia    Hypomagnesemia    Pain in finger of right hand 08/14/2021   Degenerative joint disease of shoulder region 05/07/2020   Olecranon bursitis of right elbow 05/07/2020   Pain in joint of right shoulder 04/26/2020   S/P TKR (total knee replacement), bilateral 11/04/2018   Bilateral primary osteoarthritis of knee    DDD (degenerative disc disease), cervical 01/10/2018   Rupture of medial head of gastrocnemius, left, sequela 01/14/2017   Kidney stones 06/22/2016   Essential hypertension 08/01/2010   DYSTHYMIC DISORDER 07/08/2010   PALPITATIONS 07/08/2010   Past Medical History:  Diagnosis Date   Concussion    Eosinophilic esophagitis    EGD/bx by Dr. Havery Moros   Gastric ulcer    History of kidney stones    Intermittent dysphagia    Migraine     Family History  Problem Relation Age of Onset   Arrhythmia Mother    COPD Mother     Sarcoidosis Brother    Cancer Brother    Stomach cancer Neg Hx    Pancreatic cancer Neg Hx    Esophageal cancer Neg Hx    Colon cancer Neg Hx    Rectal cancer Neg Hx     Past Surgical History:  Procedure Laterality Date   AMPUTATION Right 03/15/2022   Procedure: RIGHT BELOW KNEE AMPUTATION;  Surgeon: [name], MD;  Location: [facility];  Service: Orthopedics;  Laterality: Right;   CYSTOSCOPY     DENTAL SURGERY     FINGER SURGERY     HERNIA REPAIR     at age 23   IR FLUORO GUIDE CV LINE RIGHT  03/09/2022   IR PATIENT EVAL TECH 0-60 MINS  03/09/2022   IR US GUIDE VASC ACCESS RIGHT  03/09/2022   KNEE ARTHROSCOPY Left    LITHOTRIPSY     NEPHROLITHOTOMY     SCROTAL EXPLORATION N/A 03/16/2022   Procedure: IRRIGATION AND DEBRIDEMENT SCROTUM;  Surgeon: Raynelle Bring, MD;  Location: [facility];  Service: Urology;  Laterality: N/A;   SCROTAL EXPLORATION N/A 03/28/2022   Procedure: IRRIGATION AND DEBRIDEMENT SCROTUM;  Surgeon: Ardis Hughs, MD;  Location: [facility];  Service: Urology;  Laterality: N/A;   TONSILLECTOMY     TOTAL KNEE ARTHROPLASTY Bilateral 11/04/2018   Procedure: BILATERAL TOTAL KNEE ARTHROPLASTY;  Surgeon: [name], MD;  Location: [facility];  Service: Orthopedics;  Laterality: Bilateral;  spinal/epidural per anesthesiologist   TRANSMETATARSAL AMPUTATION Left 03/15/2022   Procedure: LEFT TRANSMETATARSAL AMPUTATION;  Surgeon: [name], MD;  Location: [facility];  Service: Orthopedics;  Laterality: Left;   Social History   Occupational History   Occupation: Airline pilot  Tobacco Use   Smoking status: Never   Smokeless tobacco: Never  Vaping Use   Vaping Use: Never used  Substance and Sexual Activity   Alcohol use: Not Currently    Comment: occassional   Drug use: No   Sexual activity: Not Currently

## 2022-07-25 DIAGNOSIS — Z992 Dependence on renal dialysis: Secondary | ICD-10-CM | POA: Diagnosis not present

## 2022-07-25 DIAGNOSIS — N186 End stage renal disease: Secondary | ICD-10-CM | POA: Diagnosis not present

## 2022-07-25 DIAGNOSIS — N2581 Secondary hyperparathyroidism of renal origin: Secondary | ICD-10-CM | POA: Diagnosis not present

## 2022-07-26 IMAGING — CT CT HEAD W/O CM
3 of 4 series · 15 of 47 positions shown, 18 images · non-contrast
Comparison: CT examination dated March 07, 2022

CLINICAL DATA: Headache, new or worsening.



[Series 2: head w o · axial · 0.46mm/px · z∈[+0,+145]mm · 9 of 35 slices shown, 12 images]
[im 3/35  brain]
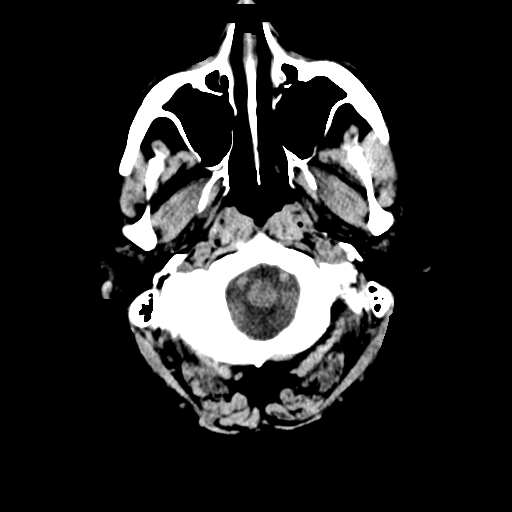
[im 3/35  bone]
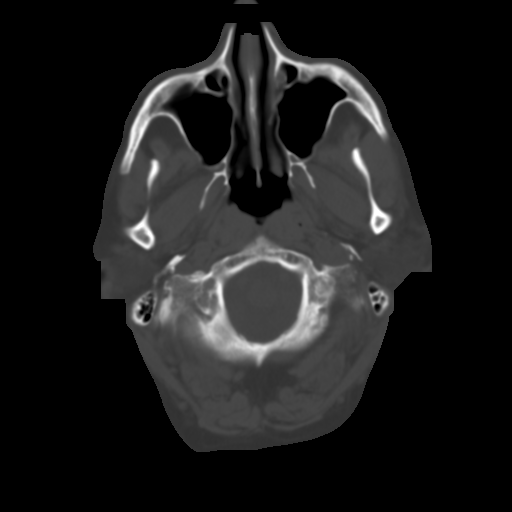
[im 8/35  brain]
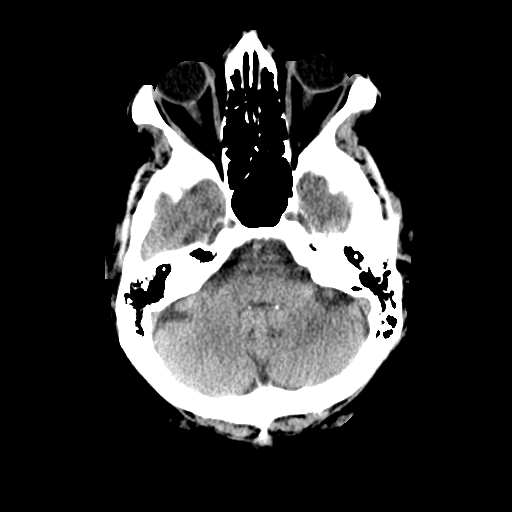
[im 10/35  brain]
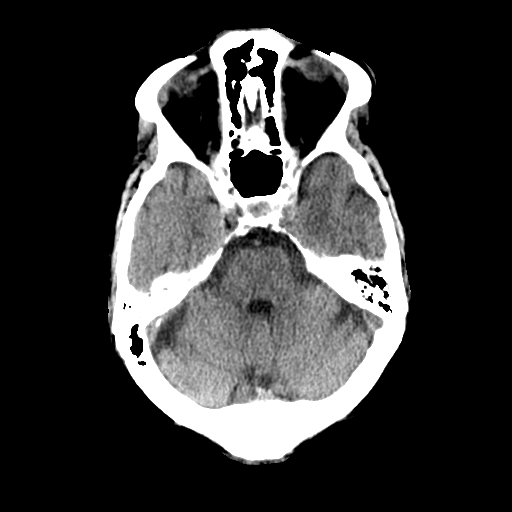
[im 15/35  brain]
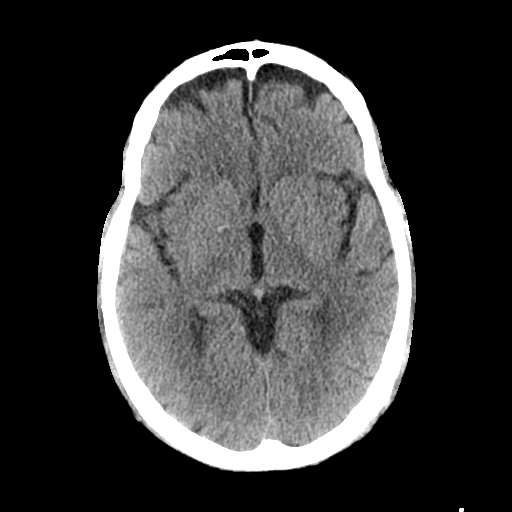
[im 18/35  brain]
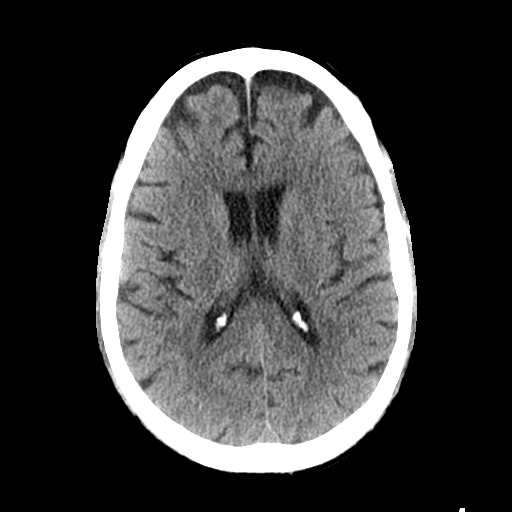
[im 18/35  bone]
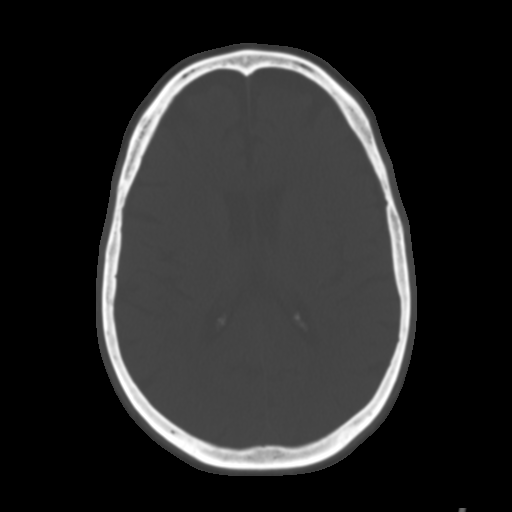
[im 20/35  brain]
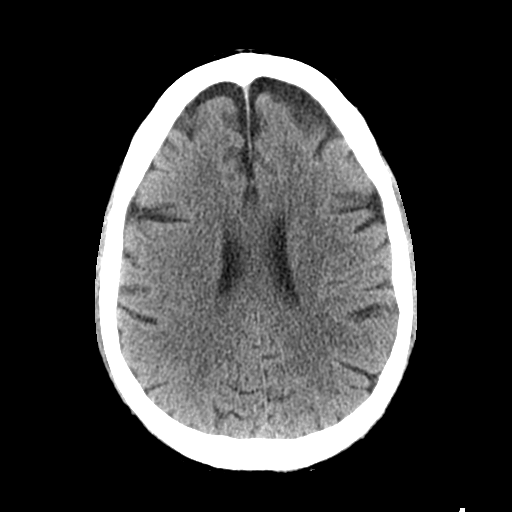
[im 25/35  brain]
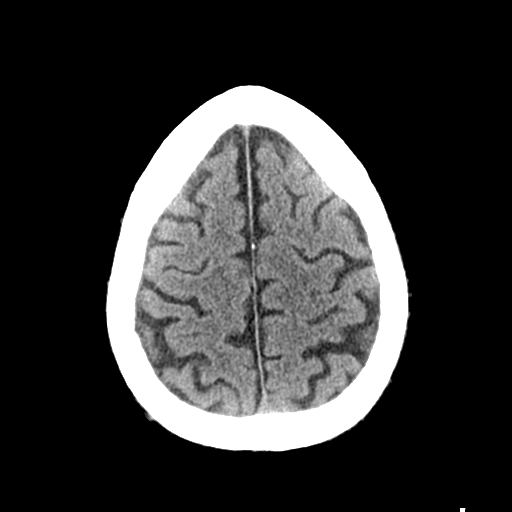
[im 27/35  brain]
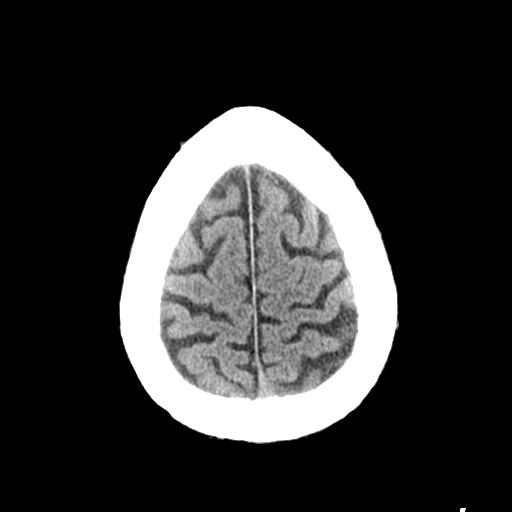
[im 32/35  brain]
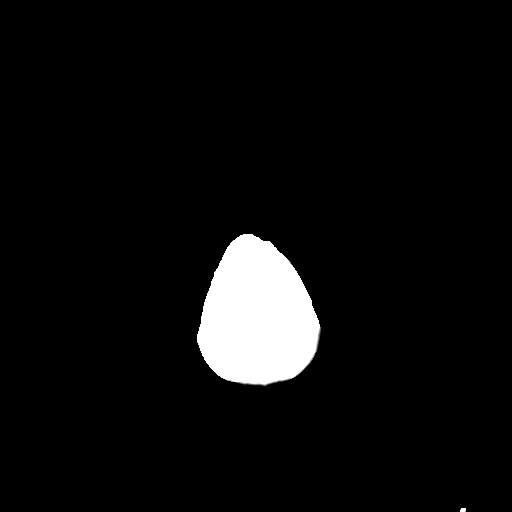
[im 32/35  bone]
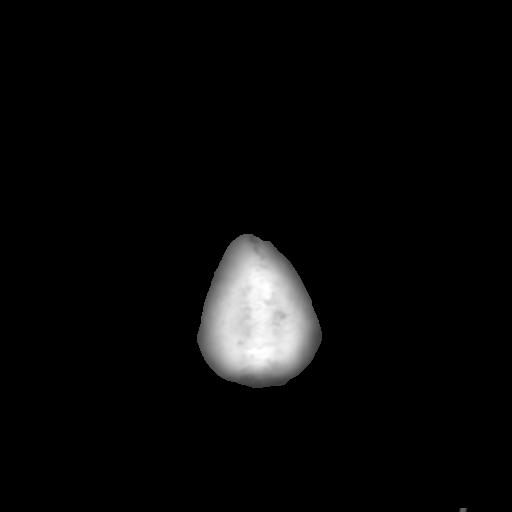

[Series 4: coronal soft · coronal · 0.36mm/px · 3 of 77 slices shown]
[im 26/77  brain]
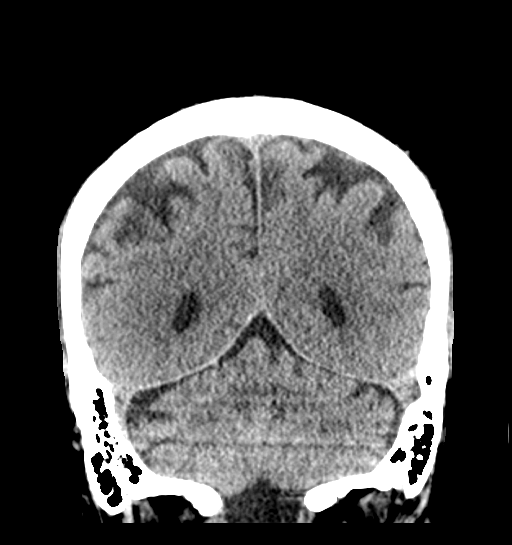
[im 34/77  brain]
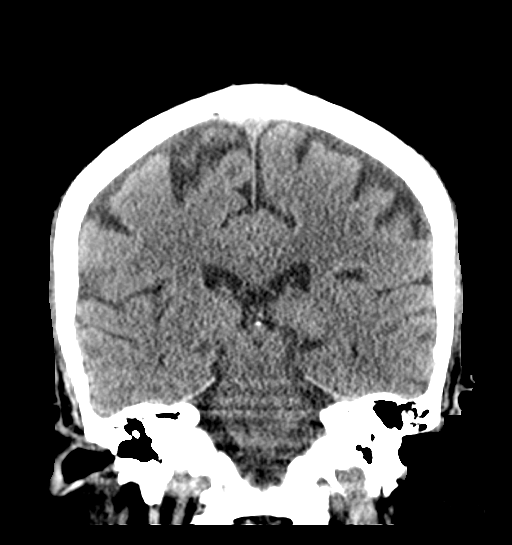
[im 43/77  brain]
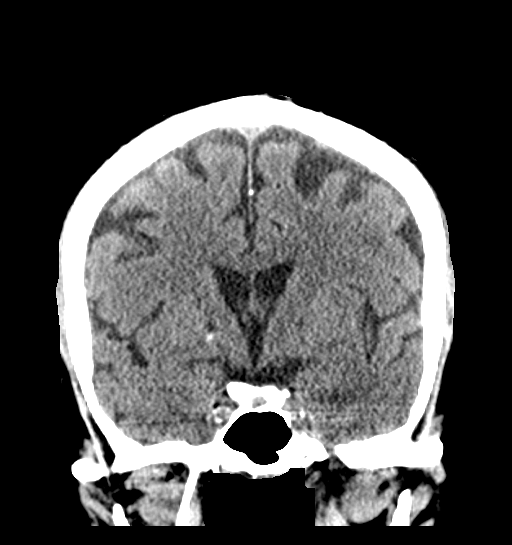

[Series 5: sagittal soft · sagittal · 0.37mm/px · 3 of 67 slices shown]
[im 23/67  brain]
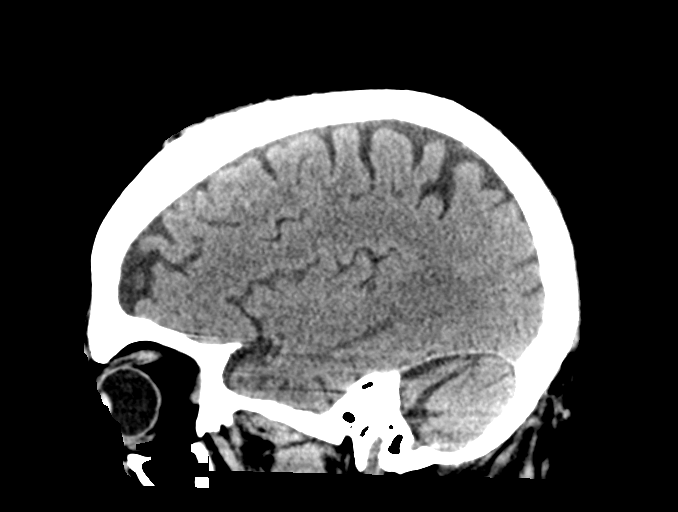
[im 34/67  brain]
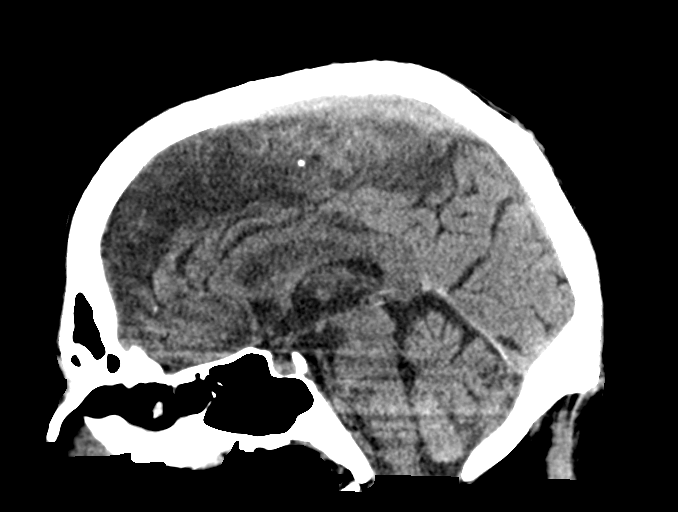
[im 45/67  brain]
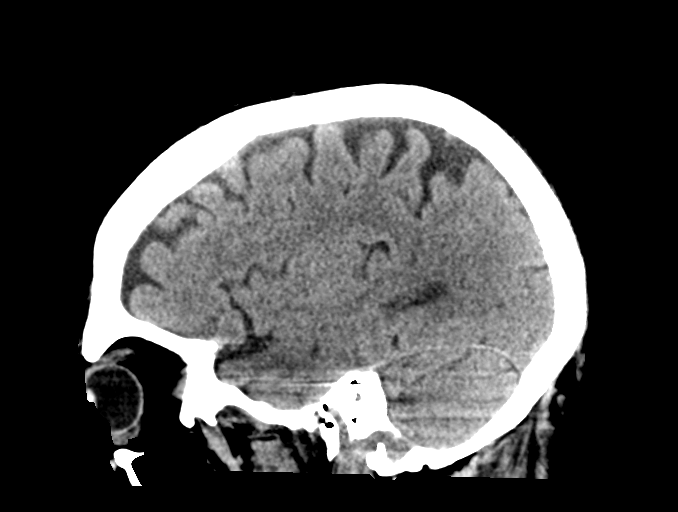

[15 of 47 positions shown; findings below may reference images not displayed]

FINDINGS: Brain: No evidence of acute infarction, hemorrhage, hydrocephalus,
extra-axial collection or mass lesion/mass effect.

Vascular: No hyperdense vessel or unexpected calcification.

Skull: Normal. Negative for fracture or focal lesion.

Sinuses/Orbits: No acute finding.

Other: None.
IMPRESSION: No acute intracranial abnormality.

## 2022-07-28 DIAGNOSIS — N2581 Secondary hyperparathyroidism of renal origin: Secondary | ICD-10-CM | POA: Diagnosis not present

## 2022-07-28 DIAGNOSIS — N186 End stage renal disease: Secondary | ICD-10-CM | POA: Diagnosis not present

## 2022-07-28 DIAGNOSIS — Z992 Dependence on renal dialysis: Secondary | ICD-10-CM | POA: Diagnosis not present

## 2022-07-29 ENCOUNTER — Encounter: Payer: BC Managed Care – PPO | Attending: Registered Nurse | Admitting: Physical Medicine & Rehabilitation

## 2022-07-29 ENCOUNTER — Encounter: Payer: Self-pay | Admitting: Physical Medicine & Rehabilitation

## 2022-07-29 ENCOUNTER — Encounter: Payer: BC Managed Care – PPO | Admitting: Physical Therapy

## 2022-07-29 VITALS — BP 98/69 | HR 92 | Ht 70.0 in

## 2022-07-29 DIAGNOSIS — R238 Other skin changes: Secondary | ICD-10-CM | POA: Diagnosis not present

## 2022-07-29 DIAGNOSIS — Z89511 Acquired absence of right leg below knee: Secondary | ICD-10-CM | POA: Diagnosis not present

## 2022-07-29 NOTE — Patient Instructions (Signed)
PLEASE FEEL FREE TO CALL OUR OFFICE WITH ANY PROBLEMS OR QUESTIONS ([phone])      

## 2022-07-29 NOTE — Progress Notes (Signed)
Subjective:    Patient ID: Jeffrey Ayala, male    DOB: 22-Jan-1961, 61 y.o.   MRN: [id number]  HPI  Jeffrey Ayala is here in follow up of his right BKA. He started therapy about 6 weeks ago. About 2 weeks ago, the incision opened up, and there are concerns of infection. He saw [name] a week ago for the breakdown.  He was given a black compression sock and was placed on some antibiotics.  He tells me that some modifications were made to the prosthesis by [employer].  He has not noticed much difference since the modifications have been made.  He feels that his leg is moving up and down in the socket when he walks.  Pain Inventory Average Pain 2 Pain Right Now 0 My pain is  no pain  LOCATION OF PAIN  leg   BOWEL Number of stools per week: 5   BLADDER Dialysis    Mobility use a walker ability to climb steps?  no do you drive?  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  no  Physicians involved in your care Any changes since last visit?  no   Family History  Problem Relation Age of Onset   Arrhythmia Mother    COPD Mother    Sarcoidosis Brother    Cancer Brother    Stomach cancer Neg Hx    Pancreatic cancer Neg Hx    Esophageal cancer Neg Hx    Colon cancer Neg Hx    Rectal cancer Neg Hx    Social History   Socioeconomic History   Marital status: Married    Spouse name: Not on file   Number of children: 2   Years of education: Not on file   Highest education level: Not on file  Occupational History   Occupation: Press photographer - Clinical cytogeneticist  Tobacco Use   Smoking status: Never   Smokeless tobacco: Never  Vaping Use   Vaping Use: Never used  Substance and Sexual Activity   Alcohol use: Not Currently     Comment: occassional   Drug use: No   Sexual activity: Not Currently  Other Topics Concern   Not on file  Social History Narrative   Not on file   Social Determinants of Health   Financial Resource Strain: Not on file  Food Insecurity: Not on file  Transportation Needs: Not on file  Physical Activity: Not on file  Stress: Not on file  Social Connections: Not on file   Past Surgical History:  Procedure Laterality Date   AMPUTATION Right 03/15/2022   Procedure: RIGHT BELOW KNEE AMPUTATION;  Surgeon: [name], MD;  Location: [facility];  Service: Orthopedics;  Laterality: Right;   CYSTOSCOPY     DENTAL SURGERY     FINGER SURGERY     HERNIA REPAIR     at age 58   IR FLUORO GUIDE CV LINE RIGHT  03/09/2022   IR PATIENT EVAL TECH 0-60 MINS  03/09/2022   IR US GUIDE VASC ACCESS RIGHT  03/09/2022   KNEE ARTHROSCOPY Left    LITHOTRIPSY     NEPHROLITHOTOMY     SCROTAL EXPLORATION N/A 03/16/2022   Procedure: IRRIGATION AND DEBRIDEMENT SCROTUM;  Surgeon: Raynelle Bring, MD;  [facility];  Service: Urology;  Laterality: N/A;   SCROTAL EXPLORATION N/A 03/28/2022   Procedure: IRRIGATION AND DEBRIDEMENT SCROTUM;  Surgeon: Ardis Hughs, MD;  Location: [facility];  Service: Urology;  Laterality: N/A;   TONSILLECTOMY     TOTAL KNEE ARTHROPLASTY Bilateral 11/04/2018   Procedure: BILATERAL TOTAL KNEE ARTHROPLASTY;  Surgeon: [name], MD;  Location: [facility];  Service: Orthopedics;  Laterality: Bilateral;  spinal/epidural per anesthesiologist   TRANSMETATARSAL AMPUTATION Left 03/15/2022   Procedure: LEFT TRANSMETATARSAL AMPUTATION;  Surgeon: [name], MD;  Location: [facility];  Service: Orthopedics;  Laterality: Left;   Past Medical History:  Diagnosis Date   Concussion    Eosinophilic esophagitis    EGD/bx by Dr. Havery Moros   Gastric ulcer    History of kidney stones    Intermittent dysphagia    Migraine    BP 98/69   Pulse 92   Ht '5\' 10"'$  (1.778 m)   SpO2 96%   BMI 27.84 kg/m    Opioid Risk Score:   Fall Risk Score:  `1  Depression screen [facility] 2/9     07/29/2022   11:06 AM 05/27/2022    8:37 AM 05/13/2022    2:30 PM 04/27/2022   11:09 AM  Depression screen PHQ 2/9  Decreased Interest 0 1 1 0  Down, Depressed, Hopeless 0 '1 2 3  '$ PHQ - 2 Score 0 '2 3 3  '$ Altered sleeping  0 0   Tired, decreased energy  0 1   Change in appetite  0 0   Feeling bad or failure about yourself   2 3   Trouble concentrating  1 0   Moving slowly or fidgety/restless  0 0   Suicidal thoughts  0 1   PHQ-9 Score  5 8   Difficult doing work/chores  Somewhat difficult Very difficult      Review of Systems  Constitutional: Negative.   HENT: Negative.    Eyes: Negative.   Respiratory:  Positive for cough.   Cardiovascular: Negative.   Gastrointestinal: Negative.   Endocrine: Negative.   Genitourinary: Negative.   Musculoskeletal:  Positive for gait problem.  Skin: Negative.   Allergic/Immunologic: Negative.   Neurological:  Positive for weakness.  Hematological: Negative.   Psychiatric/Behavioral: Negative.        Objective:   Physical Exam  Gen: no distress, normal appearing HEENT: oral mucosa pink and moist, NCAT Cardio: Reg rate Chest: normal effort, normal rate of breathing Abd: soft, non-distended Ext: no edema Psych: pleasant, normal affect Skin:     Neuro: Alert and oriented x 3. Normal insight and awareness. Intact Memory. Normal language and speech. Cranial nerve exam unremarkable attempt Musculoskeletal: Socket appears to be snug. He has liner and two socks, one inside liner over incision. Distal stump is tender to palpation.  There appears to been some modification to the socket with some buildup around the tibia.      Assessment & Plan:  1. Functional deficits secondary to debility, right BKA             -He is having some breakdown in his distal stump from his prosthesis.  The area medially is new from his last photo taken over a week ago.  I would favor  that it is fitting too loose for him currently given the positions of the wound and the fact that he has another wound medially that was not there a week ago  -Contacted the prosthetists who will see him  for reassessment  -Asked him to refrain from extended ambulation for now until his socket can be modified   Thirty minutes of face to face patient care time were spent during this visit. All questions were encouraged and answered. Follow up with me prn

## 2022-07-30 ENCOUNTER — Other Ambulatory Visit: Payer: Self-pay | Admitting: *Deleted

## 2022-07-30 DIAGNOSIS — Z992 Dependence on renal dialysis: Secondary | ICD-10-CM | POA: Diagnosis not present

## 2022-07-30 DIAGNOSIS — N2581 Secondary hyperparathyroidism of renal origin: Secondary | ICD-10-CM | POA: Diagnosis not present

## 2022-07-30 DIAGNOSIS — N185 Chronic kidney disease, stage 5: Secondary | ICD-10-CM

## 2022-07-30 DIAGNOSIS — N186 End stage renal disease: Secondary | ICD-10-CM | POA: Diagnosis not present

## 2022-07-31 ENCOUNTER — Encounter: Payer: BC Managed Care – PPO | Admitting: Physical Therapy

## 2022-08-01 DIAGNOSIS — N2581 Secondary hyperparathyroidism of renal origin: Secondary | ICD-10-CM | POA: Diagnosis not present

## 2022-08-01 DIAGNOSIS — Z992 Dependence on renal dialysis: Secondary | ICD-10-CM | POA: Diagnosis not present

## 2022-08-01 DIAGNOSIS — N186 End stage renal disease: Secondary | ICD-10-CM | POA: Diagnosis not present

## 2022-08-03 ENCOUNTER — Encounter: Payer: BC Managed Care – PPO | Admitting: Physical Therapy

## 2022-08-03 ENCOUNTER — Ambulatory Visit: Payer: BC Managed Care – PPO | Admitting: Orthopedic Surgery

## 2022-08-04 DIAGNOSIS — Z992 Dependence on renal dialysis: Secondary | ICD-10-CM | POA: Diagnosis not present

## 2022-08-04 DIAGNOSIS — N2581 Secondary hyperparathyroidism of renal origin: Secondary | ICD-10-CM | POA: Diagnosis not present

## 2022-08-04 DIAGNOSIS — N186 End stage renal disease: Secondary | ICD-10-CM | POA: Diagnosis not present

## 2022-08-05 ENCOUNTER — Encounter: Payer: Self-pay | Admitting: Vascular Surgery

## 2022-08-05 ENCOUNTER — Ambulatory Visit (INDEPENDENT_AMBULATORY_CARE_PROVIDER_SITE_OTHER): Payer: BC Managed Care – PPO | Admitting: Vascular Surgery

## 2022-08-05 ENCOUNTER — Ambulatory Visit (HOSPITAL_COMMUNITY)
Admission: RE | Admit: 2022-08-05 | Discharge: 2022-08-05 | Disposition: A | Discharge status: Home / Self Care | Payer: BC Managed Care – PPO | Source: Ambulatory Visit | Attending: Vascular Surgery | Admitting: Vascular Surgery

## 2022-08-05 ENCOUNTER — Other Ambulatory Visit: Payer: Self-pay

## 2022-08-05 ENCOUNTER — Encounter: Payer: BC Managed Care – PPO | Admitting: Physical Therapy

## 2022-08-05 VITALS — BP 90/52 | HR 98 | Temp 97.9°F | Resp 20 | Ht 70.0 in | Wt 194.0 lb

## 2022-08-05 DIAGNOSIS — N186 End stage renal disease: Secondary | ICD-10-CM

## 2022-08-05 DIAGNOSIS — N185 Chronic kidney disease, stage 5: Secondary | ICD-10-CM | POA: Insufficient documentation

## 2022-08-05 NOTE — H&P (View-Only) (Signed)
Patient ID: Jeffrey Ayala, male   DOB: Jun 03, 1961, 61 y.o.   MRN: [id number]  Reason for Consult: New Patient (Initial Visit)   Referred by Donato Heinz, MD  Subjective:     HPI:  Jeffrey Ayala is a 62 y.o. male history of end-stage renal disease currently on dialysis via right IJ tunnel catheter.  Recently had a right below-knee amputation.  He does have atrial fibrillation currently not anticoagulated hopeful to get a fistula and then be considered for ablation.  He is right-hand dominant denies any left upper extremity surgery or chest or breast surgery on the left.  No history of ports or pacemakers other than catheter on the right IJ currently.  Currently on dialysis Tuesdays, Thursdays and Saturdays.  Past Medical History:  Diagnosis Date   A-fib ([place])    Cirrhosis of liver ([place])    Concussion    Eosinophilic esophagitis    EGD/bx by Dr. Havery Moros   ESRD needing dialysis [facility])    Gastric ulcer    GERD (gastroesophageal reflux disease)    History of gastric ulcer    History of kidney stones    Intermittent dysphagia    Migraine    Family History  Problem Relation Age of Onset   Arrhythmia Mother    COPD Mother    Sarcoidosis Brother    Cancer Brother    Stomach cancer Neg Hx    Pancreatic cancer Neg Hx    Esophageal cancer Neg Hx    Colon cancer Neg Hx    Rectal cancer Neg Hx    Past Surgical History:  Procedure Laterality Date   AMPUTATION Right 03/15/2022   Procedure: RIGHT BELOW KNEE AMPUTATION;  Surgeon: [name], MD;  Location: [place];  Service: Orthopedics;  Laterality: Right;   CYSTOSCOPY     DENTAL SURGERY     FINGER SURGERY     HERNIA REPAIR     at age 44   IR FLUORO GUIDE CV LINE RIGHT  03/09/2022   IR PATIENT EVAL TECH 0-60 MINS  03/09/2022   IR US GUIDE VASC ACCESS RIGHT  03/09/2022   KNEE ARTHROSCOPY Left    LITHOTRIPSY     NEPHROLITHOTOMY     SCROTAL EXPLORATION N/A 03/16/2022   Procedure: IRRIGATION AND DEBRIDEMENT SCROTUM;   Surgeon: Raynelle Bring, MD;  Location: [place];  Service: Urology;  Laterality: N/A;   SCROTAL EXPLORATION N/A 03/28/2022   Procedure: IRRIGATION AND DEBRIDEMENT SCROTUM;  Surgeon: Ardis Hughs, MD;  Location: [place];  Service: Urology;  Laterality: N/A;   TONSILLECTOMY     TOTAL KNEE ARTHROPLASTY Bilateral 11/04/2018   Procedure: BILATERAL TOTAL KNEE ARTHROPLASTY;  Surgeon: [name], MD;  Location: [place];  Service: Orthopedics;  Laterality: Bilateral;  spinal/epidural per anesthesiologist   TRANSMETATARSAL AMPUTATION Left 03/15/2022   Procedure: LEFT TRANSMETATARSAL AMPUTATION;  Surgeon: [name], MD;  Location: [place];  Service: Orthopedics;  Laterality: Left;    Short Social History:  Social History   Tobacco Use   Smoking status: Never   Smokeless tobacco: Never  Substance Use Topics   Alcohol use: Not Currently    Comment: occassional    Allergies  Allergen Reactions   Codeine Hives    Insomnia, jittery   Flomax [Tamsulosin Hcl] Nausea Only    Nausea and fatigue    Current Outpatient Medications  Medication Sig Dispense Refill   acetaminophen (TYLENOL) 650 MG CR tablet Take 650 mg by mouth every 8 (  eight) hours as needed for pain or fever.     allopurinol (ZYLOPRIM) 100 MG tablet Take 1 tablet (100 mg total) by mouth daily. 30 tablet 3   escitalopram (LEXAPRO) 10 MG tablet TAKE 1 TABLET BY MOUTH EVERY DAY 90 tablet 1   multivitamin (RENA-VIT) TABS tablet Take 1 tablet by mouth at bedtime. 30 tablet 0   pantoprazole (PROTONIX) 40 MG tablet Take 1 tablet (40 mg total) by mouth 2 (two) times daily. 180 tablet 3   traZODone (DESYREL) 50 MG tablet TAKE 1/2 OR 1 TABLET BY MOUTH AT BEDTIME AS NEEDED FOR SLEEP 90 tablet 1   warfarin (COUMADIN) 5 MG tablet TAKE 1 TABLET (5 MG TOTAL) BY MOUTH DAILY. 30 tablet 0   No current facility-administered medications for this visit.    Review of Systems  Constitutional:  Constitutional negative. HENT: HENT negative.  Eyes:  Eyes negative.  Respiratory: Respiratory negative.  Cardiovascular: Cardiovascular negative.  GI: Gastrointestinal negative.  Skin: Positive for wound.  Neurological: Neurological negative. Hematologic: Hematologic/lymphatic negative.  Psychiatric: Psychiatric negative.        Objective:  Objective   Vitals:   08/05/22 1114  BP: (!) 90/52  Pulse: 98  Resp: 20  Temp: 97.9 F (36.6 C)  SpO2: 98%  Weight: 194 lb (88 kg)  Height: '5\' 10"'$  (1.778 m)   Body mass index is 27.84 kg/m.  Physical Exam HENT:     Head: Normocephalic.     Nose: Nose normal.  Eyes:     Pupils: Pupils are equal, round, and reactive to light.  Cardiovascular:     Pulses:          Radial pulses are 2+ on the right side and 2+ on the left side.  Abdominal:     General: Abdomen is flat.     Palpations: Abdomen is soft.  Musculoskeletal:     Comments: Right bka  Skin:    General: Skin is warm and dry.  Neurological:     General: No focal deficit present.     Mental Status: He is alert.  Psychiatric:        Mood and Affect: Mood normal.        Behavior: Behavior normal.        Thought Content: Thought content normal.     Data: Right Cephalic   Diameter (cm)Depth (cm)       Findings         +-----------------+-------------+----------+----------------------+  Shoulder             0.35        1.50                           +-----------------+-------------+----------+----------------------+  Prox upper arm       0.37        0.94                           +-----------------+-------------+----------+----------------------+  Mid upper arm        0.31        0.35                           +-----------------+-------------+----------+----------------------+  Dist upper arm       0.31        0.39                           +-----------------+-------------+----------+----------------------+  Antecubital fossa    0.43        0.36         branching          +-----------------+-------------+----------+----------------------+  Prox forearm         0.35        0.38          Thrombus         +-----------------+-------------+----------+----------------------+  Mid forearm          0.36        0.50   Thrombus and branching  +-----------------+-------------+----------+----------------------+  Dist forearm         0.26        0.28         branching         +-----------------+-------------+----------+----------------------+   +-----------------+-------------+----------+---------+  Right Basilic    Diameter (cm)Depth (cm)Findings   +-----------------+-------------+----------+---------+  Shoulder             0.59        1.60              +-----------------+-------------+----------+---------+  Prox upper arm       0.43        1.50              +-----------------+-------------+----------+---------+  Mid upper arm        0.37        1.00              +-----------------+-------------+----------+---------+  Dist upper arm       0.41        0.72   branching  +-----------------+-------------+----------+---------+  Antecubital fossa    0.29        0.30              +-----------------+-------------+----------+---------+  Prox forearm         0.22        0.32   branching  +-----------------+-------------+----------+---------+  Mid forearm          0.20        0.26   branching  +-----------------+-------------+----------+---------+  Distal forearm       0.11        0.21              +-----------------+-------------+----------+---------+   +-----------------+-------------+----------+--------------+  Left Cephalic    Diameter (cm)Depth (cm)   Findings     +-----------------+-------------+----------+--------------+  Shoulder             0.33        1.90                   +-----------------+-------------+----------+--------------+  Prox upper arm       0.34        0.78                    +-----------------+-------------+----------+--------------+  Mid upper arm        0.32        0.46                   +-----------------+-------------+----------+--------------+  Dist upper arm       0.44        0.41                   +-----------------+-------------+----------+--------------+  Antecubital fossa    0.33        0.31     branching     +-----------------+-------------+----------+--------------+  Prox forearm  0.44        0.24     branching     +-----------------+-------------+----------+--------------+  Mid forearm          0.29        0.32                   +-----------------+-------------+----------+--------------+  Dist forearm                            not visualized  +-----------------+-------------+----------+--------------+   +-----------------+-------------+----------+---------+  Left Basilic     Diameter (cm)Depth (cm)Findings   +-----------------+-------------+----------+---------+  Shoulder             0.80        1.40              +-----------------+-------------+----------+---------+  Mid upper arm        0.63        1.40              +-----------------+-------------+----------+---------+  Dist upper arm       0.61        0.45   branching  +-----------------+-------------+----------+---------+  Antecubital fossa    0.50        0.53   branching  +-----------------+-------------+----------+---------+  Prox forearm         0.34        0.54   Thrombus   +-----------------+-------------+----------+---------+  Mid forearm          0.30        0.40   Thrombus   +-----------------+-------------+----------+---------+  Distal forearm       0.17        0.21              +-----------------+-------------+----------+---------+   Right Pre-Dialysis Findings:  +----------------------------+----------+-------------------+---------+----  ----+  Location                    PSV (cm/s)Intralum.  Diam.    Waveform  Comments                                        (cm)                                    +----------------------------+----------+-------------------+---------+----  ----+  Radial artery distal upper  46        0.2                no plaque            arm                                                                           +----------------------------+----------+-------------------+---------+----  ----+  Ulnar artery distal upper   96        0.4                no plaque            arm                                                                           +----------------------------+----------+-------------------+---------+----  ----+  Brachial Antecub. fossa     73        0.38               triphasic            +----------------------------+----------+-------------------+---------+----  ----+  Radial Art at Wrist         72        0.21               triphasic            +----------------------------+----------+-------------------+---------+----  ----+  Ulnar Art at Wrist          87        0.14               triphasic            +----------------------------+----------+-------------------+---------+----  ----+              Left Pre-Dialysis Findings:  +----------------------------+----------+-------------------+---------+----  ----+  Location                    PSV (cm/s)Intralum. Diam.    Waveform  Comments                                        (cm)                                    +----------------------------+----------+-------------------+---------+----  ----+  Radial artery distal upper  92        0.2                no plaque            arm                                                                           +----------------------------+----------+-------------------+---------+----  ----+  Ulnar artery distal upper   87        0.3                 no plaque            arm                                                                           +----------------------------+----------+-------------------+---------+----  ----+  Brachial Antecub. fossa     81        0.32               triphasic            +----------------------------+----------+-------------------+---------+----  ----+  Radial Art at Wrist         65        0.22  triphasic            +----------------------------+----------+-------------------+---------+----  ----+  Ulnar Art at Wrist          100       0.11               triphasic            +----------------------------+----------+-------------------+---------+----  ----+          Assessment/Plan:    61 year old male with end-stage renal disease on dialysis via catheter.  He is right-hand dominant.  Plan for left arm AV fistula versus graft on a nondialysis day in the near future.  We discussed the risk benefits and alternatives as well as need for further procedures in the future and he demonstrates good understanding in the presence of his wife who was previously a dialysis patient.  All questions were answered.     Waynetta Sandy MD [facility]

## 2022-08-05 NOTE — Progress Notes (Signed)
Patient ID: Jeffrey Ayala, male   DOB: [birth year], 61 y.o.   MRN: [id number]  Reason for Consult: New Patient (Initial Visit)   Referred by Donato Heinz, MD  Subjective:     HPI:  Jeffrey Ayala is a 61 y.o. male history of end-stage renal disease currently on dialysis via right IJ tunnel catheter.  Recently had a right below-knee amputation.  He does have atrial fibrillation currently not anticoagulated hopeful to get a fistula and then be considered for ablation.  He is right-hand dominant denies any left upper extremity surgery or chest or breast surgery on the left.  No history of ports or pacemakers other than catheter on the right IJ currently.  Currently on dialysis Tuesdays, Thursdays and Saturdays.  Past Medical History:  Diagnosis Date   A-fib ([place])    Cirrhosis of liver ([place])    Concussion    Eosinophilic esophagitis    EGD/bx by Dr. Havery Moros   ESRD needing dialysis [facility])    Gastric ulcer    GERD (gastroesophageal reflux disease)    History of gastric ulcer    History of kidney stones    Intermittent dysphagia    Migraine    Family History  Problem Relation Age of Onset   Arrhythmia Mother    COPD Mother    Sarcoidosis Brother    Cancer Brother    Stomach cancer Neg Hx    Pancreatic cancer Neg Hx    Esophageal cancer Neg Hx    Colon cancer Neg Hx    Rectal cancer Neg Hx    Past Surgical History:  Procedure Laterality Date   AMPUTATION Right 03/15/2022   Procedure: RIGHT BELOW KNEE AMPUTATION;  Surgeon: [name], MD;  Location: [place];  Service: Orthopedics;  Laterality: Right;   CYSTOSCOPY     DENTAL SURGERY     FINGER SURGERY     HERNIA REPAIR     at age 38   IR FLUORO GUIDE CV LINE RIGHT  03/09/2022   IR PATIENT EVAL TECH 0-60 MINS  03/09/2022   IR US GUIDE VASC ACCESS RIGHT  03/09/2022   KNEE ARTHROSCOPY Left    LITHOTRIPSY     NEPHROLITHOTOMY     SCROTAL EXPLORATION N/A 03/16/2022   Procedure: IRRIGATION AND DEBRIDEMENT SCROTUM;   Surgeon: Raynelle Bring, MD;  Location: [place];  Service: Urology;  Laterality: N/A;   SCROTAL EXPLORATION N/A 03/28/2022   Procedure: IRRIGATION AND DEBRIDEMENT SCROTUM;  Surgeon: Ardis Hughs, MD;  Location: [place];  Service: Urology;  Laterality: N/A;   TONSILLECTOMY     TOTAL KNEE ARTHROPLASTY Bilateral 11/04/2018   Procedure: BILATERAL TOTAL KNEE ARTHROPLASTY;  Surgeon: [name], MD;  Location: [place];  Service: Orthopedics;  Laterality: Bilateral;  spinal/epidural per anesthesiologist   TRANSMETATARSAL AMPUTATION Left 03/15/2022   Procedure: LEFT TRANSMETATARSAL AMPUTATION;  Surgeon: [name], MD;  Location: [place];  Service: Orthopedics;  Laterality: Left;    Short Social History:  Social History   Tobacco Use   Smoking status: Never   Smokeless tobacco: Never  Substance Use Topics   Alcohol use: Not Currently    Comment: occassional    Allergies  Allergen Reactions   Codeine Hives    Insomnia, jittery   Flomax [Tamsulosin Hcl] Nausea Only    Nausea and fatigue    Current Outpatient Medications  Medication Sig Dispense Refill   acetaminophen (TYLENOL) 650 MG CR tablet Take 650 mg by mouth every 8 (  eight) hours as needed for pain or fever.     allopurinol (ZYLOPRIM) 100 MG tablet Take 1 tablet (100 mg total) by mouth daily. 30 tablet 3   escitalopram (LEXAPRO) 10 MG tablet TAKE 1 TABLET BY MOUTH EVERY DAY 90 tablet 1   multivitamin (RENA-VIT) TABS tablet Take 1 tablet by mouth at bedtime. 30 tablet 0   pantoprazole (PROTONIX) 40 MG tablet Take 1 tablet (40 mg total) by mouth 2 (two) times daily. 180 tablet 3   traZODone (DESYREL) 50 MG tablet TAKE 1/2 OR 1 TABLET BY MOUTH AT BEDTIME AS NEEDED FOR SLEEP 90 tablet 1   warfarin (COUMADIN) 5 MG tablet TAKE 1 TABLET (5 MG TOTAL) BY MOUTH DAILY. 30 tablet 0   No current facility-administered medications for this visit.    Review of Systems  Constitutional:  Constitutional negative. HENT: HENT negative.  Eyes:  Eyes negative.  Respiratory: Respiratory negative.  Cardiovascular: Cardiovascular negative.  GI: Gastrointestinal negative.  Skin: Positive for wound.  Neurological: Neurological negative. Hematologic: Hematologic/lymphatic negative.  Psychiatric: Psychiatric negative.        Objective:  Objective   Vitals:   08/05/22 1114  BP: (!) 90/52  Pulse: 98  Resp: 20  Temp: 97.9 F (36.6 C)  SpO2: 98%  Weight: 194 lb (88 kg)  Height: '5\' 10"'$  (1.778 m)   Body mass index is 27.84 kg/m.  Physical Exam HENT:     Head: Normocephalic.     Nose: Nose normal.  Eyes:     Pupils: Pupils are equal, round, and reactive to light.  Cardiovascular:     Pulses:          Radial pulses are 2+ on the right side and 2+ on the left side.  Abdominal:     General: Abdomen is flat.     Palpations: Abdomen is soft.  Musculoskeletal:     Comments: Right bka  Skin:    General: Skin is warm and dry.  Neurological:     General: No focal deficit present.     Mental Status: He is alert.  Psychiatric:        Mood and Affect: Mood normal.        Behavior: Behavior normal.        Thought Content: Thought content normal.     Data: Right Cephalic   Diameter (cm)Depth (cm)       Findings         +-----------------+-------------+----------+----------------------+  Shoulder             0.35        1.50                           +-----------------+-------------+----------+----------------------+  Prox upper arm       0.37        0.94                           +-----------------+-------------+----------+----------------------+  Mid upper arm        0.31        0.35                           +-----------------+-------------+----------+----------------------+  Dist upper arm       0.31        0.39                           +-----------------+-------------+----------+----------------------+  Antecubital fossa    0.43        0.36         branching          +-----------------+-------------+----------+----------------------+  Prox forearm         0.35        0.38          Thrombus         +-----------------+-------------+----------+----------------------+  Mid forearm          0.36        0.50   Thrombus and branching  +-----------------+-------------+----------+----------------------+  Dist forearm         0.26        0.28         branching         +-----------------+-------------+----------+----------------------+   +-----------------+-------------+----------+---------+  Right Basilic    Diameter (cm)Depth (cm)Findings   +-----------------+-------------+----------+---------+  Shoulder             0.59        1.60              +-----------------+-------------+----------+---------+  Prox upper arm       0.43        1.50              +-----------------+-------------+----------+---------+  Mid upper arm        0.37        1.00              +-----------------+-------------+----------+---------+  Dist upper arm       0.41        0.72   branching  +-----------------+-------------+----------+---------+  Antecubital fossa    0.29        0.30              +-----------------+-------------+----------+---------+  Prox forearm         0.22        0.32   branching  +-----------------+-------------+----------+---------+  Mid forearm          0.20        0.26   branching  +-----------------+-------------+----------+---------+  Distal forearm       0.11        0.21              +-----------------+-------------+----------+---------+   +-----------------+-------------+----------+--------------+  Left Cephalic    Diameter (cm)Depth (cm)   Findings     +-----------------+-------------+----------+--------------+  Shoulder             0.33        1.90                   +-----------------+-------------+----------+--------------+  Prox upper arm       0.34        0.78                    +-----------------+-------------+----------+--------------+  Mid upper arm        0.32        0.46                   +-----------------+-------------+----------+--------------+  Dist upper arm       0.44        0.41                   +-----------------+-------------+----------+--------------+  Antecubital fossa    0.33        0.31     branching     +-----------------+-------------+----------+--------------+  Prox forearm  0.44        0.24     branching     +-----------------+-------------+----------+--------------+  Mid forearm          0.29        0.32                   +-----------------+-------------+----------+--------------+  Dist forearm                            not visualized  +-----------------+-------------+----------+--------------+   +-----------------+-------------+----------+---------+  Left Basilic     Diameter (cm)Depth (cm)Findings   +-----------------+-------------+----------+---------+  Shoulder             0.80        1.40              +-----------------+-------------+----------+---------+  Mid upper arm        0.63        1.40              +-----------------+-------------+----------+---------+  Dist upper arm       0.61        0.45   branching  +-----------------+-------------+----------+---------+  Antecubital fossa    0.50        0.53   branching  +-----------------+-------------+----------+---------+  Prox forearm         0.34        0.54   Thrombus   +-----------------+-------------+----------+---------+  Mid forearm          0.30        0.40   Thrombus   +-----------------+-------------+----------+---------+  Distal forearm       0.17        0.21              +-----------------+-------------+----------+---------+   Right Pre-Dialysis Findings:  +----------------------------+----------+-------------------+---------+----  ----+  Location                    PSV (cm/s)Intralum.  Diam.    Waveform  Comments                                        (cm)                                    +----------------------------+----------+-------------------+---------+----  ----+  Radial artery distal upper  46        0.2                no plaque            arm                                                                           +----------------------------+----------+-------------------+---------+----  ----+  Ulnar artery distal upper   96        0.4                no plaque            arm                                                                           +----------------------------+----------+-------------------+---------+----  ----+  Brachial Antecub. fossa     73        0.38               triphasic            +----------------------------+----------+-------------------+---------+----  ----+  Radial Art at Wrist         72        0.21               triphasic            +----------------------------+----------+-------------------+---------+----  ----+  Ulnar Art at Wrist          87        0.14               triphasic            +----------------------------+----------+-------------------+---------+----  ----+              Left Pre-Dialysis Findings:  +----------------------------+----------+-------------------+---------+----  ----+  Location                    PSV (cm/s)Intralum. Diam.    Waveform  Comments                                        (cm)                                    +----------------------------+----------+-------------------+---------+----  ----+  Radial artery distal upper  92        0.2                no plaque            arm                                                                           +----------------------------+----------+-------------------+---------+----  ----+  Ulnar artery distal upper   87        0.3                 no plaque            arm                                                                           +----------------------------+----------+-------------------+---------+----  ----+  Brachial Antecub. fossa     81        0.32               triphasic            +----------------------------+----------+-------------------+---------+----  ----+  Radial Art at Wrist         65        0.22  triphasic            +----------------------------+----------+-------------------+---------+----  ----+  Ulnar Art at Wrist          100       0.11               triphasic            +----------------------------+----------+-------------------+---------+----  ----+          Assessment/Plan:    61 year old male with end-stage renal disease on dialysis via catheter.  He is right-hand dominant.  Plan for left arm AV fistula versus graft on a nondialysis day in the near future.  We discussed the risk benefits and alternatives as well as need for further procedures in the future and he demonstrates good understanding in the presence of his wife who was previously a dialysis patient.  All questions were answered.     Waynetta Sandy MD [facility]

## 2022-08-06 DIAGNOSIS — N186 End stage renal disease: Secondary | ICD-10-CM | POA: Diagnosis not present

## 2022-08-06 DIAGNOSIS — N2581 Secondary hyperparathyroidism of renal origin: Secondary | ICD-10-CM | POA: Diagnosis not present

## 2022-08-06 DIAGNOSIS — Z992 Dependence on renal dialysis: Secondary | ICD-10-CM | POA: Diagnosis not present

## 2022-08-08 DIAGNOSIS — Z992 Dependence on renal dialysis: Secondary | ICD-10-CM | POA: Diagnosis not present

## 2022-08-08 DIAGNOSIS — N186 End stage renal disease: Secondary | ICD-10-CM | POA: Diagnosis not present

## 2022-08-08 DIAGNOSIS — N2581 Secondary hyperparathyroidism of renal origin: Secondary | ICD-10-CM | POA: Diagnosis not present

## 2022-08-10 ENCOUNTER — Encounter: Payer: Self-pay | Admitting: Orthopedic Surgery

## 2022-08-10 ENCOUNTER — Ambulatory Visit (INDEPENDENT_AMBULATORY_CARE_PROVIDER_SITE_OTHER): Payer: BC Managed Care – PPO | Admitting: Orthopedic Surgery

## 2022-08-10 ENCOUNTER — Ambulatory Visit (INDEPENDENT_AMBULATORY_CARE_PROVIDER_SITE_OTHER): Payer: BC Managed Care – PPO | Admitting: Physical Therapy

## 2022-08-10 ENCOUNTER — Encounter: Payer: Self-pay | Admitting: Physical Therapy

## 2022-08-10 ENCOUNTER — Ambulatory Visit: Payer: BC Managed Care – PPO | Admitting: Orthopedic Surgery

## 2022-08-10 DIAGNOSIS — R609 Edema, unspecified: Secondary | ICD-10-CM

## 2022-08-10 DIAGNOSIS — R2689 Other abnormalities of gait and mobility: Secondary | ICD-10-CM | POA: Diagnosis not present

## 2022-08-10 DIAGNOSIS — L98498 Non-pressure chronic ulcer of skin of other sites with other specified severity: Secondary | ICD-10-CM | POA: Diagnosis not present

## 2022-08-10 DIAGNOSIS — M6281 Muscle weakness (generalized): Secondary | ICD-10-CM | POA: Diagnosis not present

## 2022-08-10 DIAGNOSIS — Z89511 Acquired absence of right leg below knee: Secondary | ICD-10-CM | POA: Diagnosis not present

## 2022-08-10 DIAGNOSIS — M21372 Foot drop, left foot: Secondary | ICD-10-CM

## 2022-08-10 DIAGNOSIS — R2681 Unsteadiness on feet: Secondary | ICD-10-CM | POA: Diagnosis not present

## 2022-08-10 NOTE — Therapy (Signed)
OUTPATIENT PHYSICAL THERAPY TREATMENT NOTE & 10TH VISIT PROGRESS NOTE   Patient Name: Jeffrey Ayala MRN: [id number] DOB:Dec 28, 1960, 61 y.o., male Today's Date: 08/10/2022  PCP: [name], NP-C REFERRING PROVIDER: [name], MD  Progress Note Reporting Period 06/22/22 to 08/10/22  See note below for Objective Data and Assessment of Progress/Goals.     END OF SESSION:   PT End of Session - 08/10/22 0901     Visit Number 10    Number of Visits 20    Authorization Type BCBS comm PPO    Authorization Time Period 20 visit limit PT/OT/chiro, $35 co-pay    Authorization - Visit Number 10    Authorization - Number of Visits 20    Progress Note Due on Visit 53    PT Start Time [phone]    PT Stop Time 0931    PT Time Calculation (min) 39 min    Equipment Utilized During Treatment Gait belt    Activity Tolerance Patient tolerated treatment well;Patient limited by fatigue    Behavior During Therapy WFL for tasks assessed/performed;Flat affect             Past Medical History:  Diagnosis Date   A-fib ([place])    Cirrhosis of liver ([name])    Concussion    Eosinophilic esophagitis    EGD/bx by Dr. Havery Moros   ESRD needing dialysis [facility])    Gastric ulcer    GERD (gastroesophageal reflux disease)    History of gastric ulcer    History of kidney stones    Intermittent dysphagia    Migraine    Past Surgical History:  Procedure Laterality Date   AMPUTATION Right 03/15/2022   Procedure: RIGHT BELOW KNEE AMPUTATION;  Surgeon: [name], MD;  Location: [place];  Service: Orthopedics;  Laterality: Right;   CYSTOSCOPY     DENTAL SURGERY     FINGER SURGERY     HERNIA REPAIR     at age 55   IR FLUORO GUIDE CV LINE RIGHT  03/09/2022   IR PATIENT EVAL TECH 0-60 MINS  03/09/2022   IR US GUIDE VASC ACCESS RIGHT  03/09/2022   KNEE ARTHROSCOPY Left    LITHOTRIPSY     NEPHROLITHOTOMY     SCROTAL EXPLORATION N/A 03/16/2022   Procedure: IRRIGATION AND DEBRIDEMENT SCROTUM;   Surgeon: Raynelle Bring, MD;  Location: [place];  Service: Urology;  Laterality: N/A;   SCROTAL EXPLORATION N/A 03/28/2022   Procedure: IRRIGATION AND DEBRIDEMENT SCROTUM;  Surgeon: Ardis Hughs, MD;  Location: [place];  Service: Urology;  Laterality: N/A;   TONSILLECTOMY     TOTAL KNEE ARTHROPLASTY Bilateral 11/04/2018   Procedure: BILATERAL TOTAL KNEE ARTHROPLASTY;  Surgeon: [name], MD;  Location: [place];  Service: Orthopedics;  Laterality: Bilateral;  spinal/epidural per anesthesiologist   TRANSMETATARSAL AMPUTATION Left 03/15/2022   Procedure: LEFT TRANSMETATARSAL AMPUTATION;  Surgeon: [name], MD;  Location: [place];  Service: Orthopedics;  Laterality: Left;   Patient Active Problem List   Diagnosis Date Noted   Skin breakdown 07/29/2022   Suicidal ideations 06/09/2022   Chills (without fever) 06/09/2022   Severe depression ([place]) 06/09/2022   Acute cough 06/09/2022   Reactive depression 05/31/2022   Belching 05/27/2022   Gastroesophageal reflux disease 05/27/2022   Atrial fibrillation ([place]) 05/20/2022   Long term (current) use of anticoagulants 05/20/2022   S/P transmetatarsal amputation of foot, left ([employer]) 04/18/2022   CKD (chronic kidney disease) stage 5, GFR less than  15 ml/min ([place]) 04/18/2022   Secondary hyperparathyroidism of renal origin ([place]) 04/10/2022   Pruritus, unspecified 04/10/2022   Nausea 04/10/2022   Iron deficiency anemia, unspecified 04/10/2022   Hypothyroidism, unspecified 04/10/2022   Other acute kidney failure ([place]) 04/10/2022   Dyspnea, unspecified 76/16/0737   Eosinophilic esophagitis 10/62/6948   Gastric ulcer, unspecified as acute or chronic, without hemorrhage or perforation 04/10/2022   Coagulation defect, unspecified ([place]) 04/10/2022   Allergy, unspecified, initial encounter 04/10/2022   Anaphylactic shock, unspecified, initial encounter 04/10/2022   Arthralgia of right lower leg    Acute blood loss anemia    Unilateral complete BKA,  right, initial encounter ([place]) 04/03/2022   S/P BKA (below knee amputation), right ([place]) 04/03/2022   Old myocardial infarction 04/02/2022   Dependence on renal dialysis ([place]) 04/02/2022   Acquired absence of right leg below knee ([place]) 04/02/2022   Cirrhosis ([place]) 03/31/2022   Fournier's gangrene in male    Scrotal infection 03/25/2022   Normocytic anemia 03/25/2022   Diarrhea 03/25/2022   Shock liver 03/25/2022   Acute respiratory failure with hypoxia ([place]) 03/25/2022   Gout 03/25/2022   Acute nonintractable headache 03/25/2022   Demand ischemia ([place]) 03/25/2022   Thrombocytopenia ([place]) 03/25/2022   Obesity, Class III, BMI 40-49.9 (morbid obesity) ([place]) 03/25/2022   Cardiac arrest ([place]) 03/25/2022   Hypotension    Respiratory insufficiency    Cellulitis    Malnutrition of moderate degree 03/12/2022   Altered mental status    Gangrene of right foot ([place])    Gangrene of left foot ([place])    Severe protein-calorie malnutrition ([place]) 03/10/2022   Encounter for central line placement    Pressure injury of skin 02/23/2022   Septic shock ([place]) 02/22/2022   AKI (acute kidney injury) ([place])    Generalized abdominal pain    Lactic acidosis    Atrial fibrillation with RVR ([name])    Hypokalemia    Hypomagnesemia    Pain in finger of right hand 08/14/2021   Degenerative joint disease of shoulder region 05/07/2020   Olecranon bursitis of right elbow 05/07/2020   Pain in joint of right shoulder 04/26/2020   S/P TKR (total knee replacement), bilateral 11/04/2018   Bilateral primary osteoarthritis of knee    DDD (degenerative disc disease), cervical 01/10/2018   Rupture of medial head of gastrocnemius, left, sequela 01/14/2017   Kidney stones 06/22/2016   Essential hypertension 08/01/2010   DYSTHYMIC DISORDER 07/08/2010   PALPITATIONS 07/08/2010    REFERRING DIAG: Z89.511 (ICD-10-CM) - Hx of below knee amputation, right   ONSET DATE: 06/19/2022 Prosthesis delivery  THERAPY DIAG:   Other abnormalities of gait and mobility  Muscle weakness (generalized)  Unsteadiness on feet  Non-pressure chronic ulcer of skin of other sites with other specified severity ([name])  Edema, unspecified type  Foot drop, left  Rationale for Evaluation and Treatment Rehabilitation  PERTINENT HISTORY: Rt BKA 03/15/22, Left MTA 03/15/2022, bil. TKA 11/04/18, ESRD / chronic kidney disease st5 dialysis Tues/Thurs/Sat, cirrhosis, cardiac arrest May 23 post intubation, A-Fib  PRECAUTIONS: Fall, BP only in RUE starting 10/6  SUBJECTIVE: He has been having some trouble with the prosthesis fit and feeling like he is sinking in the socket with downward movement in taking steps. He tried adding a sock this morning and couldn't get it to fit. He reports the prosthetist adjusted the pads inside the socket. He wore the prosthesis all day yesterday and most of the day Saturday with some on Friday, and limited wear before that  due to pain and discomfort. He reports new burning pain in the posterior residual limb.  PAIN:  Are you having pain? Distal residual limb 3-4/10 with standing with prosthesis   OBJECTIVE: (objective measures completed at initial evaluation unless otherwise dated)    COGNITION: 06/22/2022  Overall cognitive status: Within functional limits for tasks assessed and pt had flat affect so unable to fully assess cognition.   POSTURE: 06/22/2022  rounded shoulders, forward head, flexed trunk , and weight shift left   LOWER EXTREMITY ROM:   ROM P:passive  A:active Right Eval 06/22/2022 Left Eval 06/22/2022  Hip flexion      Hip extension Standing A: ~30* limitation Standing A: ~30* limitation  Hip abduction      Hip adduction      Hip internal rotation      Hip external rotation      Knee flexion      Knee extension Standing A: ~15* limitation Standing A: ~15* limitation  Ankle dorsiflexion   Standing A: ~25* limitation  Ankle plantarflexion      Ankle inversion       Ankle eversion       (Blank rows = not tested)   LOWER EXTREMITY MMT:   MMT Right Eval 06/22/2022 Left Eval 06/22/2022  Hip flexion 4/5 4/5  Hip extension 2/5 functional 2/5 functional  Hip abduction 2/5 functional 2/5 functional  Hip adduction      Hip internal rotation      Hip external rotation      Knee flexion      Knee extension 3-/5 functional 3-/5 functional  Ankle dorsiflexion 2-/5    Ankle plantarflexion      Ankle inversion      Ankle eversion      (Blank rows = not tested)   TRANSFERS: 06/22/2022  Sit to stand: Mod A and from 22" w/c requiring armrests to push & RW to stabilize 06/22/2022  Stand to sit: Min A from RW to 22" w/c unable to release RW to reach for w/c until seated.   GAIT: 06/22/2022 Gait pattern: step to pattern, decreased step length- Left, decreased stance time- Right, decreased stride length, decreased hip/knee flexion- Right, circumduction- Right, Right hip hike, knee flexed in stance- Right, knee flexed in stance- Left, antalgic, trunk flexed, abducted- Right, and poor foot clearance- Left excessive weightbearing BUEs on RW Distance walked: 5' Assistive device utilized: Environmental consultant - 2 wheeled and right TTA prosthesis / left plate & shoe filler Level of assistance: Mod A Comments: post gait HR 82, SpO2 99% with dyspnea noted   FUNCTIONAL TESTs:  06/22/2022  Static standing balance with BUE support on RW for 1 min with minA.     CURRENT PROSTHETIC WEAR ASSESSMENT: 06/22/2022 Patient is dependent with: skin check, residual limb care, care of non-amputated limb, prosthetic cleaning, ply sock cleaning, correct ply sock adjustment, proper wear schedule/adjustment, and proper weight-bearing schedule/adjustment Donning prosthesis: Total A and max cues required, Doffing prosthesis: Total A and wife able to doffe Prosthetic wear tolerance: 1-2 hours 1-2x/day, for 3 of 3 days since delivery.  Prosthetic weight bearing tolerance: 0 minutes unable to stand  outside of PT Edema: pitting with >5sec capillary refill Residual limb condition: 26m wound with serous drainage & black scab present,  calf measurement is 42.2cm Prosthetic description:  silicon liner with pin suspension, flexible inner socket Total Contact design, dynamic response foot with hydraulic PF/DF K code/activity level with prosthetic use: Level 3     TODAY'S TREATMENT:  08/10/2022 Prosthetic Training with TTA prosthesis: PT educated on consistent prosthesis wear for skin and edema concerns, donning socks after checking alignment of liner and prosthesis, management of burning pain due to friction from liner donning. pt verbalized understanding Prosthetist ground down anterior hard socket in front of distal tibia. Wound on residual limb is 51m across and circular with scab covering, with one darker spot in center of wound PT reviewed prosthesis donning with liner and pin alignment, seated positioning. Pt verbalized understanding PT advised to ask MD about UE restrictions following fistula surgery, pt and wife verbalized understanding STS w/c to RW x 3 with verbal cueing for hand placement Pt reached 10" with single UE assist on RW with SBA Pt turned over each shoulder with decreased rotation and minA to control walker with turn to Lt, turn to Rt was SBA Pt amb 135' with SBA and heavy UE support on RW due to pain, increased to 5/10 posterior calf constant pain and pain decreased with limb elevation PT advised consistent wear and short walks with RW to increase tolerance, elevating leg to decrease pain. pt and wife verbalized understanding  07/22/2022 Prosthetic Training with TTA prosthesis: Wound on lateral incision [id number] scab covering. Pain in limb distal to wound with slight pressure.  He has severe pain with light palpation to distal limb more laterally.   PT spoke with Dr. DudSharol Giveno agreed that a diagnostic US [place] indicated to rule an abscess or internal issue.   EriDondra PraderP came to PT clinic and took scab from wound with no noted tunneling. She also agreed that internal issue may be present & scheduled diagnostic US [place] Dr. BroRolena Infantemorrow morning prior to dialysis.   PT recommended wear shrinker with Vive Wear shrinker against skin due to wound.  If no issue is found with diagnostic US,[place] resume wearing prosthesis but limit weight bearing until he sees PT next week. Pt & wife verbalized understanding.    07/15/2022 Neuromuscular Re-ed for standing balance Standing in corner with RW in front for safety:  Standing on floor eyes open feet ~4-5" apart head turns right/left, up/down & 2 diagonals with intermittent touch RW & walls  Standing on floor eyes closed feet ~6-7" apart head turns right/left, up/down & 2 diagonals with light touch on side of RW  Standing on foam eyes open feet ~6-7" apart head turns right/left, up/down & 2 diagonals with light touch on side of RW PT educated on above as HEP including safe set-up & wife assisting. Pt & wife verbalized understanding. PT reviewed doing HEP at sink, standing with back to door frame, above HEP & progressive walking program (short increasing frequency, long / max tolerable with wife following with w/c 1-2x/day & medium bw short & long distance 4+/day).  Breaking up each group during day which will be very limited on dialysis days to build overall stamina for standing & walking.  Pt & wife verbalized understanding.  Prosthetic Care with right TTA prosthesis & left TMA shoe filler with carbon plate: Continue wear all awake hours and switching underliner every 4 hours.  Pt & wife verbalized understanding. Pt ambulated 45'47ith cane stand alone tip & HHA with minA. Tactile & verbal cues on sequence, upright posture, balance reactions & wt shift over stance LE.    HOME EXERCISE PROGRAM:  [place] this position when standing  still  for activities.   USE TAPE ON FLOOR TO MARK THE MIDLINE POSITION which is even with middle of sink.  You also should try to feel with your limb pressure in socket.  You are trying to feel with limb what you used to feel with the bottom of your foot.  Side to Side Shift: Moving your hips only (not shoulders): move weight onto your left leg, HOLD/FEEL pressure in socket.  Move back to equal weight on each leg, HOLD/FEEL pressure in socket. Move weight onto your right leg, HOLD/FEEL pressure in socket. Move back to equal weight on each leg, HOLD/FEEL pressure in socket. Repeat.  Start with both hands on sink, progress to hand on prosthetic side only, then no hands.  Front to Back Shift: Moving your hips only (not shoulders): move your weight forward onto your toes, HOLD/FEEL pressure in socket. Move your weight back to equal Flat Foot on both legs, HOLD/FEEL  pressure in socket. Move your weight back onto your heels, HOLD/FEEL  pressure in socket. Move your weight back to equal on both legs, HOLD/FEEL  pressure in socket. Repeat.  Start with both hands on sink, progress to hand on prosthetic side only, then no hands.  Moving Cones / Cups: With equal weight on each leg: Hold on with one hand the first time, then progress to no hand supports. Move cups from one side of sink to the other. Place cups ~2" out of your reach, progress to 10" beyond reach.  Place one hand in middle of sink and reach with other hand. Do both arms.  Then hover one hand and move cups with other hand.  Overhead/Upward Reaching: alternated reaching up to top cabinets or ceiling if no cabinets present. Keep equal weight on each leg. Start with one hand support on counter while other hand reaches and progress to no hand support with reaching.  ace one hand in middle of sink and reach with other hand. Do both arms.  Then hover one hand and move cups with other hand.  5.   Looking Over Shoulders: With equal weight on each leg: alternate  turning to look over your shoulders with one hand support on counter as needed.  Start with head motions only to look in front of shoulder, then even with shoulder and progress to looking behind you. To look to side, move head /eyes, then shoulder on side looking pulls back, shift more weight to side looking and pull hip back. Place one hand in middle of sink and let go with other hand so your shoulder can pull back. Switch hands to look other way.   Then hover one hand and look over shoulder. If looking right, use left hand at sink. If looking left, use right hand at sink. 6.  Stepping with leg that is not amputated:  Move items under cabinet out of your way. Shift your hips/pelvis so weight on prosthesis. Tighten muscles in hip on prosthetic side.  SLOWLY step other leg so front of foot is in cabinet. Then step back to floor.   Pt also recommending sitting at edge of w/c with feet supported on floor but no back support each hour in w/c until back fatigues. PT also demo & instructed in sitting on 24" stool. Pt & wife verbalized understanding.     ASSESSMENT:   CLINICAL IMPRESSION: In this 10 visit progress note period, he has made significant improvements in his functional mobility with prosthesis and RW and with prosthetic care elements. He  was limited today by increased pain in distal limb. STGs remain ongoing with the exception of the ambulation goal with RW. He is continuing to benefit from skilled PT to address prosthetic training and functional mobility.   OBJECTIVE IMPAIRMENTS Abnormal gait, cardiopulmonary status limiting activity, decreased activity tolerance, decreased balance, decreased endurance, decreased knowledge of condition, decreased knowledge of use of DME, decreased mobility, difficulty walking, decreased ROM, decreased strength, increased edema, impaired flexibility, postural dysfunction, and prosthetic dependency .    ACTIVITY LIMITATIONS carrying, lifting, bending, sitting,  standing, squatting, stairs, transfers, locomotion level, and prosthesis use   PARTICIPATION LIMITATIONS: driving, community activity, occupation, yard work, and recreational of camping   [place], Past/current experiences, Time since onset of injury/illness/exacerbation, and 3+ comorbidities: see PMH  are also affecting patient's functional outcome.    REHAB POTENTIAL: Good   CLINICAL DECISION MAKING: Evolving/moderate complexity   EVALUATION COMPLEXITY: Moderate     GOALS: Goals reviewed with patient? Yes   SHORT TERM GOALS: Target date: 07/22/2022   Patient donnes prosthesis modified independent & verbalizes proper cleaning. Baseline: SEE OBJECTIVE DATA Goal status: ongoing 08/10/22 2.  Patient tolerates prosthesis >10 hrs total /day without increase in skin issues & no limb pain. Baseline: SEE OBJECTIVE DATA Goal status: ongoing 08/10/22   3.  Patient able to reach 5" and look over both shoulders with RW support with supervision. Baseline: SEE OBJECTIVE DATA Goal status: ongoing 08/10/22   4. Patient ambulates 69' with RW & prosthesis with minA. Baseline: SEE OBJECTIVE DATA Goal status: met 08/10/22 with SBA     LONG TERM GOALS: Target date: 08/27/2022   Patient demonstrates & verbalized understanding of prosthetic care to enable safe utilization of prosthesis. Baseline: SEE OBJECTIVE DATA Goal status: INITIAL   Patient tolerates prosthesis wear >90% of awake hours without skin or limb pain issues. Baseline: SEE OBJECTIVE DATA Goal status: INITIAL   Berg Balance >/= 36/56 to indicate lower fall risk Baseline: SEE OBJECTIVE DATA Goal status: INITIAL   Patient ambulates >300' with prosthesis & LRAD independently Baseline: SEE OBJECTIVE DATA Goal status: INITIAL   Patient negotiates ramps, curbs & stairs with single rail with prosthesis & LRAD independently. Baseline: SEE OBJECTIVE DATA Goal status: INITIAL     PLAN: PT FREQUENCY: 2x/week   PT  DURATION: 10 weeks   PLANNED INTERVENTIONS: Therapeutic exercises, Therapeutic activity, Neuromuscular re-education, Balance training, Gait training, Patient/Family education, Self Care, Stair training, Vestibular training, Canalith repositioning, Prosthetic training, DME instructions, Vasopneumatic device, and physical performance testing   PLAN FOR NEXT SESSION: continue to progress prosthetic gait with cane & HHA. standing balance with cues for upright posture as tolerated   Jana Hakim, Student-PT 08/10/2022, 4:22 PM  This entire session of physical therapy was performed under the direct supervision of PT signing evaluation /treatment. PT reviewed note and agrees.  Jamey Reas, PT, DPT 08/10/2022, 4:55 PM

## 2022-08-10 NOTE — Progress Notes (Signed)
Office Visit Note   Patient: Jeffrey Ayala           Date of Birth: [birth year]           MRN: [id number] Visit Date: 08/10/2022              Requested by: [name], NP-C [place],  [place] [address] PCP: [name], NP-C  Chief Complaint  Patient presents with   Right Leg - Routine Post Op    03/15/22 right BKA      HPI: Patient is a 61 year old gentleman who presents complaining of subsiding into his socket with pain over the residual limb.  Patient also has phantom pain..  Assessment & Plan: Visit Diagnoses:  1. Hx of below knee amputation, right ([place])     Plan: Plan to follow-up in 3 months for evaluation for new socket.  Recommended using a mirror to help with the phantom pain.  Follow-Up Instructions: Return in about 3 months (around 11/09/2022).   Ortho Exam  Patient is alert, oriented, no adenopathy, well-dressed, normal affect, normal respiratory effort. Examination patient has good hair growth and consolidation of the residual limb.  There is a small scab that was removed and no signs of a stitch abscess there was good healthy granulation tissue.  Patient's socket has been heavily modified and patient states he is still bottoming out.  Imaging: No results found.   Labs: Lab Results  Component Value Date   HGBA1C 5.5 02/23/2022   ESRSEDRATE 20 (H) 02/22/2022   CRP 13.8 (H) 02/23/2022   LABURIC 8.5 (H) 05/27/2022   LABURIC 6.3 11/14/2018   LABURIC 7.4 04/10/2014   REPTSTATUS 04/05/2022 FINAL 03/16/2022   GRAMSTAIN  03/16/2022    NO WBC SEEN ABUNDANT GRAM POSITIVE COCCI FEW GRAM NEGATIVE RODS    CULT  03/16/2022    ABUNDANT ENTEROCOCCUS FAECALIS FEW PSEUDOMONAS AERUGINOSA MULTI-DRUG RESISTANT ORGANISM ABUNDANT BACTEROIDES OVATUS BETA LACTAMASE POSITIVE CRITICAL RESULT CALLED TO, READ BACK BY AND VERIFIED WITH: PHARMD K PIERCE [id number] AT 1019 BY CM Sent to [place] for further susceptibility testing. SEE REPORT IN  EPIC Performed at [facility], [place] [address] [place], [place] [address]    [facility] ENTEROCOCCUS FAECALIS 03/16/2022   LABORGA PSEUDOMONAS AERUGINOSA 03/16/2022     Lab Results  Component Value Date   ALBUMIN 4.0 06/09/2022   ALBUMIN 4.2 05/27/2022   ALBUMIN 3.6 04/18/2022    Lab Results  Component Value Date   MG 1.9 04/18/2022   MG 2.1 04/01/2022   MG 2.0 03/31/2022   No results found for: "VD25OH"  No results found for: "PREALBUMIN"    Latest Ref Rng & Units 06/09/2022   12:07 PM 04/27/2022    2:25 PM 04/20/2022    5:01 AM  CBC EXTENDED  WBC 4.0 - 10.5 K/uL 10.4  7.7  7.3   RBC 4.22 - 5.81 Mil/uL 2.29  2.69  2.52   Hemoglobin 13.0 - 17.0 g/dL 7.4 Repeated and verified X2.  8.3  7.9   HCT 39.0 - 52.0 % 21.6 Repeated and verified X2.  25.2  24.0   Platelets 150.0 - 400.0 K/uL 240.0  281  216   NEUT# 1.4 - 7.7 K/uL 8.4     Lymph# 0.7 - 4.0 K/uL 1.1        There is no height or weight on file to calculate BMI.  Orders:  No orders of the defined types were placed in this encounter.  No orders of the defined types were placed in this encounter.    Procedures: No procedures performed  Clinical Data: No additional findings.  ROS:  All other systems negative, except as noted in the HPI. Review of Systems  Objective: Vital Signs: There were no vitals taken for this visit.  Specialty Comments:  No specialty comments available.  PMFS History: Patient Active Problem List   Diagnosis Date Noted   Skin breakdown 07/29/2022   Suicidal ideations 06/09/2022   Chills (without fever) 06/09/2022   Severe depression ([place]) 06/09/2022   Acute cough 06/09/2022   Reactive depression 05/31/2022   Belching 05/27/2022   Gastroesophageal reflux disease 05/27/2022   Atrial fibrillation ([place]) 05/20/2022   Long term (current) use of anticoagulants 05/20/2022   S/P transmetatarsal amputation of foot, left ([place]) 04/18/2022   CKD (chronic kidney disease) stage 5, GFR  less than 15 ml/min ([place]) 04/18/2022   Secondary hyperparathyroidism of renal origin ([place]) 04/10/2022   Pruritus, unspecified 04/10/2022   Nausea 04/10/2022   Iron deficiency anemia, unspecified 04/10/2022   Hypothyroidism, unspecified 04/10/2022   Other acute kidney failure ([place]) 04/10/2022   Dyspnea, unspecified 02/63/7858   Eosinophilic esophagitis 85/12/7739   Gastric ulcer, unspecified as acute or chronic, without hemorrhage or perforation 04/10/2022   Coagulation defect, unspecified ([place]) 04/10/2022   Allergy, unspecified, initial encounter 04/10/2022   Anaphylactic shock, unspecified, initial encounter 04/10/2022   Arthralgia of right lower leg    Acute blood loss anemia    Unilateral complete BKA, right, initial encounter ([place]) 04/03/2022   S/P BKA (below knee amputation), right ([place]) 04/03/2022   Old myocardial infarction 04/02/2022   Dependence on renal dialysis ([place]) 04/02/2022   Acquired absence of right leg below knee ([place]) 04/02/2022   Cirrhosis ([place]) 03/31/2022   Fournier's gangrene in male    Scrotal infection 03/25/2022   Normocytic anemia 03/25/2022   Diarrhea 03/25/2022   Shock liver 03/25/2022   Acute respiratory failure with hypoxia ([place]) 03/25/2022   Gout 03/25/2022   Acute nonintractable headache 03/25/2022   Demand ischemia ([place]) 03/25/2022   Thrombocytopenia ([place]) 03/25/2022   Obesity, Class III, BMI 40-49.9 (morbid obesity) ([place]) 03/25/2022   Cardiac arrest ([place]) 03/25/2022   Hypotension    Respiratory insufficiency    Cellulitis    Malnutrition of moderate degree 03/12/2022   Altered mental status    Gangrene of right foot ([place])    Gangrene of left foot ([place])    Severe protein-calorie malnutrition ([place]) 03/10/2022   Encounter for central line placement    Pressure injury of skin 02/23/2022   Septic shock ([place]) 02/22/2022   AKI (acute kidney injury) ([place])    Generalized abdominal pain    Lactic acidosis    Atrial fibrillation with RVR ([name])     Hypokalemia    Hypomagnesemia    Pain in finger of right hand 08/14/2021   Degenerative joint disease of shoulder region 05/07/2020   Olecranon bursitis of right elbow 05/07/2020   Pain in joint of right shoulder 04/26/2020   S/P TKR (total knee replacement), bilateral 11/04/2018   Bilateral primary osteoarthritis of knee    DDD (degenerative disc disease), cervical 01/10/2018   Rupture of medial head of gastrocnemius, left, sequela 01/14/2017   Kidney stones 06/22/2016   Essential hypertension 08/01/2010   DYSTHYMIC DISORDER 07/08/2010   PALPITATIONS 07/08/2010   Past Medical History:  Diagnosis Date   A-fib [facility])    Cirrhosis of liver ([place])    Concussion    Eosinophilic  esophagitis    EGD/bx by Dr. Havery Moros   ESRD needing dialysis [facility])    Gastric ulcer    GERD (gastroesophageal reflux disease)    History of gastric ulcer    History of kidney stones    Intermittent dysphagia    Migraine     Family History  Problem Relation Age of Onset   Arrhythmia Mother    COPD Mother    Sarcoidosis Brother    Cancer Brother    Stomach cancer Neg Hx    Pancreatic cancer Neg Hx    Esophageal cancer Neg Hx    Colon cancer Neg Hx    Rectal cancer Neg Hx     Past Surgical History:  Procedure Laterality Date   AMPUTATION Right 03/15/2022   Procedure: RIGHT BELOW KNEE AMPUTATION;  Surgeon: [name], MD;  Location: [facility];  Service: Orthopedics;  Laterality: Right;   CYSTOSCOPY     DENTAL SURGERY     FINGER SURGERY     HERNIA REPAIR     at age 22   IR FLUORO GUIDE CV LINE RIGHT  03/09/2022   IR PATIENT EVAL TECH 0-60 MINS  03/09/2022   IR US GUIDE VASC ACCESS RIGHT  03/09/2022   KNEE ARTHROSCOPY Left    LITHOTRIPSY     NEPHROLITHOTOMY     SCROTAL EXPLORATION N/A 03/16/2022   Procedure: IRRIGATION AND DEBRIDEMENT SCROTUM;  Surgeon: Raynelle Bring, MD;  Location: [facility];  Service: Urology;  Laterality: N/A;   SCROTAL EXPLORATION N/A 03/28/2022   Procedure: IRRIGATION AND  DEBRIDEMENT SCROTUM;  Surgeon: Ardis Hughs, MD;  Location: [facility];  Service: Urology;  Laterality: N/A;   TONSILLECTOMY     TOTAL KNEE ARTHROPLASTY Bilateral 11/04/2018   Procedure: BILATERAL TOTAL KNEE ARTHROPLASTY;  Surgeon: [name], MD;  Location: [facility];  Service: Orthopedics;  Laterality: Bilateral;  spinal/epidural per anesthesiologist   TRANSMETATARSAL AMPUTATION Left 03/15/2022   Procedure: LEFT TRANSMETATARSAL AMPUTATION;  Surgeon: [name], MD;  Location: [facility];  Service: Orthopedics;  Laterality: Left;   Social History   Occupational History   Occupation: Airline pilot  Tobacco Use   Smoking status: Never   Smokeless tobacco: Never  Vaping Use   Vaping Use: Never used  Substance and Sexual Activity   Alcohol use: Not Currently    Comment: occassional   Drug use: No   Sexual activity: Not Currently

## 2022-08-12 ENCOUNTER — Encounter: Payer: Self-pay | Admitting: Gastroenterology

## 2022-08-12 ENCOUNTER — Encounter: Payer: Self-pay | Admitting: Physical Therapy

## 2022-08-12 ENCOUNTER — Ambulatory Visit (INDEPENDENT_AMBULATORY_CARE_PROVIDER_SITE_OTHER): Payer: BC Managed Care – PPO | Admitting: Physical Therapy

## 2022-08-12 ENCOUNTER — Ambulatory Visit (INDEPENDENT_AMBULATORY_CARE_PROVIDER_SITE_OTHER): Payer: BC Managed Care – PPO | Admitting: Gastroenterology

## 2022-08-12 VITALS — BP 110/70 | HR 79 | Ht 70.0 in | Wt 196.0 lb

## 2022-08-12 DIAGNOSIS — M6281 Muscle weakness (generalized): Secondary | ICD-10-CM

## 2022-08-12 DIAGNOSIS — R2689 Other abnormalities of gait and mobility: Secondary | ICD-10-CM | POA: Diagnosis not present

## 2022-08-12 DIAGNOSIS — D649 Anemia, unspecified: Secondary | ICD-10-CM | POA: Diagnosis not present

## 2022-08-12 DIAGNOSIS — K7460 Unspecified cirrhosis of liver: Secondary | ICD-10-CM

## 2022-08-12 DIAGNOSIS — M21372 Foot drop, left foot: Secondary | ICD-10-CM

## 2022-08-12 DIAGNOSIS — Z992 Dependence on renal dialysis: Secondary | ICD-10-CM

## 2022-08-12 DIAGNOSIS — L98498 Non-pressure chronic ulcer of skin of other sites with other specified severity: Secondary | ICD-10-CM

## 2022-08-12 DIAGNOSIS — Z1211 Encounter for screening for malignant neoplasm of colon: Secondary | ICD-10-CM | POA: Diagnosis not present

## 2022-08-12 DIAGNOSIS — R2681 Unsteadiness on feet: Secondary | ICD-10-CM

## 2022-08-12 DIAGNOSIS — N186 End stage renal disease: Secondary | ICD-10-CM

## 2022-08-12 DIAGNOSIS — R609 Edema, unspecified: Secondary | ICD-10-CM

## 2022-08-12 MED ORDER — PLENVU 140 G PO SOLR: ORAL | 0 refills | Status: DC

## 2022-08-12 NOTE — Therapy (Signed)
OUTPATIENT PHYSICAL THERAPY TREATMENT NOTE   Patient Name: Jeffrey Ayala MRN: [id number] DOB:March 12, 1961, 61 y.o., male Today's Date: 08/12/2022  PCP: [name], NP-C REFERRING PROVIDER: [name], MD   END OF SESSION:   PT End of Session - 08/12/22 1027     Visit Number 11    Number of Visits 20    Authorization Type BCBS comm PPO    Authorization Time Period 20 visit limit PT/OT/chiro, $35 co-pay    Authorization - Visit Number 11    Authorization - Number of Visits 20    Progress Note Due on Visit 20    PT Start Time 1025    PT Stop Time 1100    PT Time Calculation (min) 35 min    Equipment Utilized During Treatment Gait belt    Activity Tolerance Patient tolerated treatment well;Patient limited by fatigue    Behavior During Therapy WFL for tasks assessed/performed;Flat affect              Past Medical History:  Diagnosis Date   A-fib ([place])    Cirrhosis of liver ([place])    Concussion    Eosinophilic esophagitis    EGD/bx by Dr. Havery Moros   ESRD needing dialysis [facility])    Gastric ulcer    GERD (gastroesophageal reflux disease)    History of gastric ulcer    History of kidney stones    Intermittent dysphagia    Migraine    Past Surgical History:  Procedure Laterality Date   AMPUTATION Right 03/15/2022   Procedure: RIGHT BELOW KNEE AMPUTATION;  Surgeon: [name], MD;  Location: [place];  Service: Orthopedics;  Laterality: Right;   CYSTOSCOPY     DENTAL SURGERY     FINGER SURGERY     HERNIA REPAIR     at age 61   IR FLUORO GUIDE CV LINE RIGHT  03/09/2022   IR PATIENT EVAL TECH 0-60 MINS  03/09/2022   IR US GUIDE VASC ACCESS RIGHT  03/09/2022   KNEE ARTHROSCOPY Left    LITHOTRIPSY     NEPHROLITHOTOMY     SCROTAL EXPLORATION N/A 03/16/2022   Procedure: IRRIGATION AND DEBRIDEMENT SCROTUM;  Surgeon: Raynelle Bring, MD;  Location: [place];  Service: Urology;  Laterality: N/A;   SCROTAL EXPLORATION N/A 03/28/2022   Procedure: IRRIGATION AND  DEBRIDEMENT SCROTUM;  Surgeon: Ardis Hughs, MD;  Location: [place];  Service: Urology;  Laterality: N/A;   TONSILLECTOMY     TOTAL KNEE ARTHROPLASTY Bilateral 11/04/2018   Procedure: BILATERAL TOTAL KNEE ARTHROPLASTY;  Surgeon: [name], MD;  Location: [place];  Service: Orthopedics;  Laterality: Bilateral;  spinal/epidural per anesthesiologist   TRANSMETATARSAL AMPUTATION Left 03/15/2022   Procedure: LEFT TRANSMETATARSAL AMPUTATION;  Surgeon: [name], MD;  Location: [place];  Service: Orthopedics;  Laterality: Left;   Patient Active Problem List   Diagnosis Date Noted   Skin breakdown 07/29/2022   Suicidal ideations 06/09/2022   Chills (without fever) 06/09/2022   Severe depression ([place]) 06/09/2022   Acute cough 06/09/2022   Reactive depression 05/31/2022   Belching 05/27/2022   Gastroesophageal reflux disease 05/27/2022   Atrial fibrillation ([place]) 05/20/2022   Long term (current) use of anticoagulants 05/20/2022   S/P transmetatarsal amputation of foot, left ([place]) 04/18/2022   CKD (chronic kidney disease) stage 5, GFR less than 15 ml/min ([place]) 04/18/2022   Secondary hyperparathyroidism of renal origin ([place]) 04/10/2022   Pruritus, unspecified 04/10/2022   Nausea 04/10/2022   Iron  deficiency anemia, unspecified 04/10/2022   Hypothyroidism, unspecified 04/10/2022   Other acute kidney failure ([place]) 04/10/2022   Dyspnea, unspecified 87/86/7672   Eosinophilic esophagitis 09/47/0962   Gastric ulcer, unspecified as acute or chronic, without hemorrhage or perforation 04/10/2022   Coagulation defect, unspecified ([place]) 04/10/2022   Allergy, unspecified, initial encounter 04/10/2022   Anaphylactic shock, unspecified, initial encounter 04/10/2022   Arthralgia of right lower leg    Acute blood loss anemia    Unilateral complete BKA, right, initial encounter ([place]) 04/03/2022   S/P BKA (below knee amputation), right ([place]) 04/03/2022   Old myocardial infarction 04/02/2022    Dependence on renal dialysis ([place]) 04/02/2022   Acquired absence of right leg below knee ([place]) 04/02/2022   Cirrhosis ([place]) 03/31/2022   Fournier's gangrene in male    Scrotal infection 03/25/2022   Normocytic anemia 03/25/2022   Diarrhea 03/25/2022   Shock liver 03/25/2022   Acute respiratory failure with hypoxia ([name]) 03/25/2022   Gout 03/25/2022   Acute nonintractable headache 03/25/2022   Demand ischemia ([place]) 03/25/2022   Thrombocytopenia ([place]) 03/25/2022   Obesity, Class III, BMI 40-49.9 (morbid obesity) ([place]) 03/25/2022   Cardiac arrest ([place]) 03/25/2022   Hypotension    Respiratory insufficiency    Cellulitis    Malnutrition of moderate degree 03/12/2022   Altered mental status    Gangrene of right foot ([place])    Gangrene of left foot ([place])    Severe protein-calorie malnutrition ([place]) 03/10/2022   Encounter for central line placement    Pressure injury of skin 02/23/2022   Septic shock ([place]) 02/22/2022   AKI (acute kidney injury) ([place])    Generalized abdominal pain    Lactic acidosis    Atrial fibrillation with RVR ([name])    Hypokalemia    Hypomagnesemia    Pain in finger of right hand 08/14/2021   Degenerative joint disease of shoulder region 05/07/2020   Olecranon bursitis of right elbow 05/07/2020   Pain in joint of right shoulder 04/26/2020   S/P TKR (total knee replacement), bilateral 11/04/2018   Bilateral primary osteoarthritis of knee    DDD (degenerative disc disease), cervical 01/10/2018   Rupture of medial head of gastrocnemius, left, sequela 01/14/2017   Kidney stones 06/22/2016   Essential hypertension 08/01/2010   DYSTHYMIC DISORDER 07/08/2010   PALPITATIONS 07/08/2010    REFERRING DIAG: Z89.511 (ICD-10-CM) - Hx of below knee amputation, right   ONSET DATE: 06/19/2022 Prosthesis delivery  THERAPY DIAG:  Other abnormalities of gait and mobility  Muscle weakness (generalized)  Unsteadiness on feet  Non-pressure chronic ulcer of skin of other  sites with other specified severity ([name])  Edema, unspecified type  Foot drop, left  Rationale for Evaluation and Treatment Rehabilitation  PERTINENT HISTORY: Rt BKA 03/15/22, Left MTA 03/15/2022, bil. TKA 11/04/18, ESRD / chronic kidney disease st5 dialysis Tues/Thurs/Sat, cirrhosis, cardiac arrest May 23 post intubation, A-Fib  PRECAUTIONS: Fall, BP only in RUE starting 10/6  SUBJECTIVE: He arrived from an appointment with GI/liver and has an endoscopy and colonoscopy planned for 09/07/22. His kidney doctor says that kidney functioning looks good & is improving. Duda scheduled a follow up appointment and removed part of the scab on residual limb. Prosthetist took cast of leg for a new system and they will try it on next Friday 9/29. He wears the prosthesis all day including at dialysis.  PAIN:  Are you having pain? Distal residual limb 0/10 when sitting, increased pain with standing and gait   OBJECTIVE: (objective measures completed  at initial evaluation unless otherwise dated)    COGNITION: 06/22/2022  Overall cognitive status: Within functional limits for tasks assessed and pt had flat affect so unable to fully assess cognition.   POSTURE: 06/22/2022  rounded shoulders, forward head, flexed trunk , and weight shift left   LOWER EXTREMITY ROM:   ROM P:passive  A:active Right Eval 06/22/2022 Left Eval 06/22/2022  Hip flexion      Hip extension Standing A: ~30* limitation Standing A: ~30* limitation  Hip abduction      Hip adduction      Hip internal rotation      Hip external rotation      Knee flexion      Knee extension Standing A: ~15* limitation Standing A: ~15* limitation  Ankle dorsiflexion   Standing A: ~25* limitation  Ankle plantarflexion      Ankle inversion      Ankle eversion       (Blank rows = not tested)   LOWER EXTREMITY MMT:   MMT Right Eval 06/22/2022 Left Eval 06/22/2022  Hip flexion 4/5 4/5  Hip extension 2/5 functional 2/5 functional  Hip  abduction 2/5 functional 2/5 functional  Hip adduction      Hip internal rotation      Hip external rotation      Knee flexion      Knee extension 3-/5 functional 3-/5 functional  Ankle dorsiflexion 2-/5    Ankle plantarflexion      Ankle inversion      Ankle eversion      (Blank rows = not tested)   TRANSFERS: 06/22/2022  Sit to stand: Mod A and from 22" w/c requiring armrests to push & RW to stabilize 06/22/2022  Stand to sit: Min A from RW to 22" w/c unable to release RW to reach for w/c until seated.   GAIT: 06/22/2022 Gait pattern: step to pattern, decreased step length- Left, decreased stance time- Right, decreased stride length, decreased hip/knee flexion- Right, circumduction- Right, Right hip hike, knee flexed in stance- Right, knee flexed in stance- Left, antalgic, trunk flexed, abducted- Right, and poor foot clearance- Left excessive weightbearing BUEs on RW Distance walked: 5' Assistive device utilized: Environmental consultant - 2 wheeled and right TTA prosthesis / left plate & shoe filler Level of assistance: Mod A Comments: post gait HR 82, SpO2 99% with dyspnea noted   FUNCTIONAL TESTs:  06/22/2022  Static standing balance with BUE support on RW for 1 min with minA.     CURRENT PROSTHETIC WEAR ASSESSMENT: 06/22/2022 Patient is dependent with: skin check, residual limb care, care of non-amputated limb, prosthetic cleaning, ply sock cleaning, correct ply sock adjustment, proper wear schedule/adjustment, and proper weight-bearing schedule/adjustment Donning prosthesis: Total A and max cues required, Doffing prosthesis: Total A and wife able to doffe Prosthetic wear tolerance: 1-2 hours 1-2x/day, for 3 of 3 days since delivery.  Prosthetic weight bearing tolerance: 0 minutes unable to stand outside of PT Edema: pitting with >5sec capillary refill Residual limb condition: 36m wound with serous drainage & black scab present,  calf measurement is 42.2cm Prosthetic description:  silicon liner  with pin suspension, flexible inner socket Total Contact design, dynamic response foot with hydraulic PF/DF K code/activity level with prosthetic use: Level 3     TODAY'S TREATMENT:  08/12/2022 Prosthetic Training with TTA prosthesis: Wound on residual limb is continuing to heal and is flat across without pinprick appearance, PT advised continuous monitoring of raised scab or suture expulsion and pt and wife  verbalized understanding. PT educated on re-weighing new system to provide info for dialysis to subtract from weigh in & out, pt and wife verbalized understanding Pt educated on signs of sweating and management with drying liner and limb, pt verbalized understanding and received printed handout of information  Neuro Re-ed: Standing on floor eyes open feet ~4-5" apart head turns right/left, up/down & 2 diagonals with frequent touch //bars due to anterior losses of balance Standing eyes closed feet ~4-5" apart frequent UE support and able to stand ~2s at a time, anterior and posterior losses of balance with weight shift left Standing on floor eyes closed feet ~4-5" apart head turns right/left, up/down & 2 diagonals back of hand BUE support on //bars, occasional anterior losses of balance and pt required frequent verbal and tactile cueing for upright posture with reported fatigue following activity  08/10/2022 Prosthetic Training with TTA prosthesis: PT educated on consistent prosthesis wear for skin and edema concerns, donning socks after checking alignment of liner and prosthesis, management of burning pain due to friction from liner donning. pt verbalized understanding Prosthetist ground down anterior hard socket in front of distal tibia. Wound on residual limb is 69m across and circular with scab covering, with one darker spot in center of wound PT reviewed prosthesis donning with liner and pin alignment, seated positioning. Pt verbalized understanding PT advised to ask MD about UE  restrictions following fistula surgery, pt and wife verbalized understanding STS w/c to RW x 3 with verbal cueing for hand placement Pt reached 10" with single UE assist on RW with SBA Pt turned over each shoulder with decreased rotation and minA to control walker with turn to Lt, turn to Rt was SBA Pt amb 135' with SBA and heavy UE support on RW due to pain, increased to 5/10 posterior calf constant pain and pain decreased with limb elevation PT advised consistent wear and short walks with RW to increase tolerance, elevating leg to decrease pain. pt and wife verbalized understanding  07/22/2022 Prosthetic Training with TTA prosthesis: Wound on lateral incision 122mX [id number] scab covering. Pain in limb distal to wound with slight pressure.  He has severe pain with light palpation to distal limb more laterally.   PT spoke with Dr. DudSharol Giveno agreed that a diagnostic US [place] indicated to rule an abscess or internal issue.   EriDondra PraderP came to PT clinic and took scab from wound with no noted tunneling. She also agreed that internal issue may be present & scheduled diagnostic US Koreath Dr. BroRolena Infantemorrow morning prior to dialysis.   PT recommended wear shrinker with Vive Wear shrinker against skin due to wound.  If no issue is found with diagnostic US,[place] resume wearing prosthesis but limit weight bearing until he sees PT next week. Pt & wife verbalized understanding.    HOME EXERCISE PROGRAM:  [facility] MIDLINE POSITION AND PLACE FEET EQUAL DISTANCE FROM THE MIDLINE.  Try to find this position when standing still for activities.   USE TAPE ON FLOOR TO MARK THE MIDLINE POSITION which is even with middle of sink.  You also should try to feel with your limb pressure in socket.  You are trying to feel with limb what you used to feel with the bottom of your foot.  Side to Side Shift: Moving your hips only (not shoulders): move weight onto your left leg, HOLD/FEEL pressure in socket.  Move  back to equal weight on each leg, HOLD/FEEL pressure in  socket. Move weight onto your right leg, HOLD/FEEL pressure in socket. Move back to equal weight on each leg, HOLD/FEEL pressure in socket. Repeat.  Start with both hands on sink, progress to hand on prosthetic side only, then no hands.  Front to Back Shift: Moving your hips only (not shoulders): move your weight forward onto your toes, HOLD/FEEL pressure in socket. Move your weight back to equal Flat Foot on both legs, HOLD/FEEL  pressure in socket. Move your weight back onto your heels, HOLD/FEEL  pressure in socket. Move your weight back to equal on both legs, HOLD/FEEL  pressure in socket. Repeat.  Start with both hands on sink, progress to hand on prosthetic side only, then no hands.  Moving Cones / Cups: With equal weight on each leg: Hold on with one hand the first time, then progress to no hand supports. Move cups from one side of sink to the other. Place cups ~2" out of your reach, progress to 10" beyond reach.  Place one hand in middle of sink and reach with other hand. Do both arms.  Then hover one hand and move cups with other hand.  Overhead/Upward Reaching: alternated reaching up to top cabinets or ceiling if no cabinets present. Keep equal weight on each leg. Start with one hand support on counter while other hand reaches and progress to no hand support with reaching.  ace one hand in middle of sink and reach with other hand. Do both arms.  Then hover one hand and move cups with other hand.  5.   Looking Over Shoulders: With equal weight on each leg: alternate turning to look over your shoulders with one hand support on counter as needed.  Start with head motions only to look in front of shoulder, then even with shoulder and progress to looking behind you. To look to side, move head /eyes, then shoulder on side looking pulls back, shift more weight to side looking and pull hip back. Place one hand in middle of sink and let go with other hand  so your shoulder can pull back. Switch hands to look other way.   Then hover one hand and look over shoulder. If looking right, use left hand at sink. If looking left, use right hand at sink. 6.  Stepping with leg that is not amputated:  Move items under cabinet out of your way. Shift your hips/pelvis so weight on prosthesis. Tighten muscles in hip on prosthetic side.  SLOWLY step other leg so front of foot is in cabinet. Then step back to floor.   Pt also recommending sitting at edge of w/c with feet supported on floor but no back support each hour in w/c until back fatigues. PT also demo & instructed in sitting on 24" stool. Pt & wife verbalized understanding.     ASSESSMENT:   CLINICAL IMPRESSION: As he will be receiving new prosthesis next week, POC was adjusted to begin visits following new socket to limit visits due to 20 visit limit with insurance. He tolerated standing balance exercises well with high frequency of anterior losses of balance and he continues to benefit from cueing for upright posture during standing activity. He appears to understand signs of sweating and management with verbal and printed instructions. He continues to benefit from skilled PT.   OBJECTIVE IMPAIRMENTS Abnormal gait, cardiopulmonary status limiting activity, decreased activity tolerance, decreased balance, decreased endurance, decreased knowledge of condition, decreased knowledge of use of DME, decreased mobility, difficulty walking, decreased ROM, decreased strength,  increased edema, impaired flexibility, postural dysfunction, and prosthetic dependency .    ACTIVITY LIMITATIONS carrying, lifting, bending, sitting, standing, squatting, stairs, transfers, locomotion level, and prosthesis use   PARTICIPATION LIMITATIONS: driving, community activity, occupation, yard work, and recreational of camping   [place], Past/current experiences, Time since onset of injury/illness/exacerbation, and 3+  comorbidities: see PMH  are also affecting patient's functional outcome.    REHAB POTENTIAL: Good   CLINICAL DECISION MAKING: Evolving/moderate complexity   EVALUATION COMPLEXITY: Moderate     GOALS: Goals reviewed with patient? Yes   SHORT TERM GOALS: Target date: 07/22/2022   Patient donnes prosthesis modified independent & verbalizes proper cleaning. Baseline: SEE OBJECTIVE DATA Goal status: ongoing 08/10/22 2.  Patient tolerates prosthesis >10 hrs total /day without increase in skin issues & no limb pain. Baseline: SEE OBJECTIVE DATA Goal status: ongoing 08/12/22   3.  Patient able to reach 5" and look over both shoulders with RW support with supervision. Baseline: SEE OBJECTIVE DATA Goal status: ongoing 08/10/22   4. Patient ambulates 44' with RW & prosthesis with minA. Baseline: SEE OBJECTIVE DATA Goal status: met 08/10/22 with SBA     LONG TERM GOALS: Target date: 08/27/2022   Patient demonstrates & verbalized understanding of prosthetic care to enable safe utilization of prosthesis. Baseline: SEE OBJECTIVE DATA Goal status: INITIAL   Patient tolerates prosthesis wear >90% of awake hours without skin or limb pain issues. Baseline: SEE OBJECTIVE DATA Goal status: INITIAL   Berg Balance >/= 36/56 to indicate lower fall risk Baseline: SEE OBJECTIVE DATA Goal status: INITIAL   Patient ambulates >300' with prosthesis & LRAD independently Baseline: SEE OBJECTIVE DATA Goal status: INITIAL   Patient negotiates ramps, curbs & stairs with single rail with prosthesis & LRAD independently. Baseline: SEE OBJECTIVE DATA Goal status: INITIAL     PLAN: PT FREQUENCY: 2x/week   PT DURATION: 10 weeks   PLANNED INTERVENTIONS: Therapeutic exercises, Therapeutic activity, Neuromuscular re-education, Balance training, Gait training, Patient/Family education, Self Care, Stair training, Vestibular training, Canalith repositioning, Prosthetic training, DME instructions, Vasopneumatic  device, and physical performance testing   PLAN FOR NEXT SESSION: educate on donning & differences with new socket & pin vac suspension, assess new socket and balance/ambulatory capacity.  Assess condition of residual limb and wear time. At end of [place] recertify with 1x/wk frequency to limit visits.    Jana Hakim, Student-PT 08/12/2022, 12:23 PM  This entire session of physical therapy was performed under the direct supervision of PT signing evaluation /treatment. PT reviewed note and agrees.  Jamey Reas, PT, DPT 08/12/2022, 12:51 PM

## 2022-08-12 NOTE — Progress Notes (Signed)
HPI :  61 year old male here for follow-up visit for cirrhosis.  Recall he has a history of EOE, gastric ulcers.  He had a prolonged hospital stay in May and June of this year, 52 days.  He was septic due to a scrotal infection, developed new onset A-fib leading to anticoagulation use, developed acute kidney injury which progressed to now end-stage renal disease on HD.  He also went right BKA and left transmetatarsal amputation.  During that time he had imaging which showed cirrhosis of the liver which was a new diagnosis for him.  He was seen in July by Alonza Bogus, had some serologies sent to look for causes of chronic liver disease which showed the following: positive ANA 1:320 SMA mildly positive IgG 1509 Ceruloplasmin 24 AMA negative Alpha one antitrpsin normal Hep B negative Not immune to hep B Iron sat and ferritin elevated 03/26/22:  His last INR was done while he was on anticoagulation.  His LFTs have historically been normal.  Platelet count normal.  He continues to recover from his hospitalization and working on ambulation following his leg surgery.  He has not had any jaundice, no ascites or lower extremity swelling.  He denies any blood in stools.  No history of known varices.  He has no family history noted of liver disease.  He denies any alcohol use in the past.  He has had his hemoglobin checked every Thursday with dialysis, he states its been running low sevens to eights.  His stools again are brown and he denies any blood.  He has had his anticoagulation held for his A-fib given his low hemoglobin.  He takes Protonix 40 mg once daily.  He states he has not had any issues with EOE.  Specifically no dysphagia, no reflux that bothers him in general.  He denies any family history of colon cancer, he is not had a colonoscopy in a very long time, more than 10 years.  To be considered for renal transplant he was told he needs an up-to-date colonoscopy. I see a report of a flexible  sigmoidoscopy in 1990 which was normal.  He has never had a full colonoscopy or exam since that time.  Denies any problems with his bowels     EGD 01/02/20 -  - Mucosal changes including mild ringed esophagus and longitudinal furrows were found in the middle third of the esophagus and in the lower third of the esophagus. Biopsies were obtained from the proximal and distal esophagus with cold forceps for histology for possible underlying eosinophilic esophagitis. - The exam of the esophagus was otherwise normal. No obvious stenosis / stricture noted. - One cratered gastric ulcer was found at the pylorus. The lesion was 3 mm in largest dimension, associated with significant edema / inflammatory changes. Biopsies were taken with a cold forceps for histology from the periphery of it. - Patchy moderate inflammation characterized by erythema, friability and granularity was found in the gastric antrum. - The exam of the stomach was otherwise normal. - Biopsies were taken with a cold forceps in the gastric body, at the incisura and in the gastric antrum for Helicobacter pylori testing. - Diffuse moderate inflammation characterized by erythema was found in the distal duodenal bulb, with edema traversing this area into the sweep. Biopsies were taken with a cold forceps for histology. - The exam of the duodenum was otherwise normal.   1. Surgical [P], duodenal - DUODENAL MUCOSA WITH CHANGES CONSISTENT WITH PEPTIC INJURY. - NO FEATURES OF  SPRUE OR GRANULOMAS. 2. Surgical [P], gastric antrum and gastric body - ANTRAL AND OXYNTIC MUCOSA WITH MILDLY ACTIVE CHRONIC INFLAMMATION AND REACTIVE CHANGES. - WARTHIN-STARRY NEGATIVE FOR HELICOBACTER PYLORI. - NO INTESTINAL METAPLASIA, DYSPLASIA OR CARCINOMA. 3. Surgical [P], esophagus - SQUAMOUS MUCOSA WITH REFLUX CHANGES AND INCREASED EOSINOPHILS. - FOCALLY GREATER THAN 20 PER HIGH POWER FIELD. - NO DYSPLASIA OR CARCINOMA. 4. Surgical [P], gastric pylorus,  peripheral ulcer - PYLORIC MUCOSA WITH MILDLY ACTIVE INFLAMMATION, HYPEREMIA AND REACTIVE CHANGES. - NO INTESTINAL METAPLASIA, DYSPLASIA OR CARCINOMA.    CT renal stone 03/31/22: IMPRESSION: 1. No acute intra-abdominal or pelvic pathology. 2. Multiple nonobstructing bilateral renal calculi. No hydronephrosis. 3. Colonic diverticulosis. No bowel obstruction. Normal appendix. 4. Cirrhosis with splenomegaly and small perihepatic ascites. 5. Aortic Atherosclerosis (ICD10-I70.0).   CT 02/28/22: IMPRESSION: 1. Severe scrotal edema and bilateral hydroceles, increased compared to prior examination. 2. Ascites and anasarca. 3. Multiple small bilateral nonobstructive renal calculi. No hydronephrosis. 4. Splenomegaly. 5. Cardiomegaly and coronary artery disease.   RUQ [place] 02/24/22: IMPRESSION: 1. Two focal hypoechoic geographic areas seen at the gallbladder fossa, likely due to focal fatty sparing, although liver mass cannot be excluded. Recommend further evaluation with contrast-enhanced liver MRI which can be performed non emergently. 2. Hepatic steatosis. 3. Mildly nodular liver contour, suggestive of cirrhosis.  Past Medical History:  Diagnosis Date   A-fib ([place])    Cirrhosis of liver ([place])    Concussion    Eosinophilic esophagitis    EGD/bx by Dr. Havery Moros   ESRD needing dialysis [facility])    Gastric ulcer    GERD (gastroesophageal reflux disease)    History of gastric ulcer    History of kidney stones    Intermittent dysphagia    Migraine      Past Surgical History:  Procedure Laterality Date   AMPUTATION Right 03/15/2022   Procedure: RIGHT BELOW KNEE AMPUTATION;  Surgeon: [name], MD;  Location: [place];  Service: Orthopedics;  Laterality: Right;   CYSTOSCOPY     DENTAL SURGERY     FINGER SURGERY     HERNIA REPAIR     at age 37   IR FLUORO GUIDE CV LINE RIGHT  03/09/2022   IR PATIENT EVAL TECH 0-60 MINS  03/09/2022   IR US GUIDE VASC ACCESS RIGHT  03/09/2022   KNEE  ARTHROSCOPY Left    LITHOTRIPSY     NEPHROLITHOTOMY     SCROTAL EXPLORATION N/A 03/16/2022   Procedure: IRRIGATION AND DEBRIDEMENT SCROTUM;  Surgeon: Raynelle Bring, MD;  Location: [place];  Service: Urology;  Laterality: N/A;   SCROTAL EXPLORATION N/A 03/28/2022   Procedure: IRRIGATION AND DEBRIDEMENT SCROTUM;  Surgeon: Ardis Hughs, MD;  Location: [place];  Service: Urology;  Laterality: N/A;   TONSILLECTOMY     TOTAL KNEE ARTHROPLASTY Bilateral 11/04/2018   Procedure: BILATERAL TOTAL KNEE ARTHROPLASTY;  Surgeon: [name], MD;  Location: [place];  Service: Orthopedics;  Laterality: Bilateral;  spinal/epidural per anesthesiologist   TRANSMETATARSAL AMPUTATION Left 03/15/2022   Procedure: LEFT TRANSMETATARSAL AMPUTATION;  Surgeon: [name], MD;  Location: [place];  Service: Orthopedics;  Laterality: Left;   Family History  Problem Relation Age of Onset   Arrhythmia Mother    COPD Mother    Sarcoidosis Brother    Cancer Brother    Stomach cancer Neg Hx    Pancreatic cancer Neg Hx    Esophageal cancer Neg Hx    Colon cancer Neg Hx    Rectal cancer  Neg Hx    Social History   Tobacco Use   Smoking status: Never   Smokeless tobacco: Never  Vaping Use   Vaping Use: Never used  Substance Use Topics   Alcohol use: Not Currently    Comment: occassional   Drug use: No   Current Outpatient Medications  Medication Sig Dispense Refill   acetaminophen (TYLENOL) 650 MG CR tablet Take 650 mg by mouth every 8 (eight) hours as needed for pain or fever.     allopurinol (ZYLOPRIM) 100 MG tablet Take 1 tablet (100 mg total) by mouth daily. 30 tablet 3   multivitamin (RENA-VIT) TABS tablet Take 1 tablet by mouth at bedtime. 30 tablet 0   pantoprazole (PROTONIX) 40 MG tablet Take 1 tablet (40 mg total) by mouth 2 (two) times daily. 180 tablet 3   PEG-KCl-NaCl-NaSulf-Na Asc-C (PLENVU) 140 g SOLR Use as directed 1 each 0   traZODone (DESYREL) 50 MG tablet TAKE 1/2 OR 1 TABLET BY MOUTH AT  BEDTIME AS NEEDED FOR SLEEP 90 tablet 1   escitalopram (LEXAPRO) 10 MG tablet TAKE 1 TABLET BY MOUTH EVERY DAY 90 tablet 1   warfarin (COUMADIN) 5 MG tablet TAKE 1 TABLET (5 MG TOTAL) BY MOUTH DAILY. 30 tablet 0   No current facility-administered medications for this visit.   Allergies  Allergen Reactions   Codeine Hives    Insomnia, jittery   Flomax [Tamsulosin Hcl] Nausea Only    Nausea and fatigue     Review of Systems: All systems reviewed and negative except where noted in HPI.   Lab Results  Component Value Date   WBC 10.4 06/09/2022   HGB 7.4 Repeated and verified X2. (LL) 06/09/2022   HCT 21.6 Repeated and verified X2. (LL) 06/09/2022   MCV 94.0 06/09/2022   PLT 240.0 06/09/2022    Lab Results  Component Value Date   CREATININE 5.69 (HH) 06/09/2022   BUN 36 (H) 06/09/2022   NA 139 06/09/2022   K 3.1 (L) 06/09/2022   CL 97 06/09/2022   CO2 31 06/09/2022    Lab Results  Component Value Date   ALT 6 06/09/2022   AST 11 06/09/2022   ALKPHOS 88 06/09/2022   BILITOT 0.6 06/09/2022     Physical Exam: BP 110/70   Pulse 79   Ht '5\' 10"'$  (1.778 m)   Wt 196 lb (88.9 kg)   BMI 28.12 kg/m  Constitutional: Pleasant,well-developed, male in no acute distress. Skin: Skin is warm and dry. No rashes noted. Psychiatric: Normal mood and affect. Behavior is normal.   ASSESSMENT: 61 y.o. male here for assessment of the following  1. Hepatic cirrhosis, unspecified hepatic cirrhosis type, unspecified whether ascites present ([place])   2. Anemia, unspecified type   3. ESRD on dialysis ([place])   4. Colon cancer screening    As above, prolonged hospitalization for severe infection with multiple issues in recent months, incidentally noted to have cirrhosis on imaging.  No alcohol use, fatty liver noted on imaging.  Interestingly CT showed no cirrhosis in April, 1 month later CT suggest cirrhosis.  If he has cirrhosis he has compensated liver disease.  His platelets are normal,  no clinical decompensations.  I discussed cirrhosis with him and his wife, risks for decompensation and [name] moving forward.  Potentially fatty liver has caused this however iron saturation and ferritin were high, could have been acute phase reactant in the setting of hospitalization previously but will check hemochromatosis genetic testing and repeat this.  His autoimmune serologies for ANA and SMA are positive but IgG normal.  Unusual to have autoimmune hepatitis with a normal IgG, we will repeat this.  We discussed that if he really wanted to know underlying etiology and rule out autoimmune hepatitis we could consider a liver biopsy, discussed risks of doing that.  Hopefully we can avoid that but we will see what his IgG shows.  He also needs to have hep C checked as I do not see that done yet, and check for immunity to hepatitis A, and vaccinate to that if needed.  He is due for right upper quadrant ultrasound for [place] screening next month, given his last CT scan was noncontrast study.  Recommend he avoid alcohol and avoid NSAIDs.  I do recommend an EGD to screen for varices given his diagnosis of cirrhosis and significant anemia.  Discussed risks and benefits.  Given his end-stage renal disease this need to be done at the hospital for anesthesia support.  Likewise he is also due for colon cancer screening, if he ever wants to be considered for renal transplant he will need to have an up-to-date colonoscopy, for this purpose and his anemia also recommend colonoscopy done at the same time.  Following this discussion he is agreeable to proceed.  Tentatively scheduled for October at the hospital.  PLAN: - lab today - hep C AB, hep A total AB, TIBC / ferritin, hemochromatosis genetic test, IgG level - may need to consider liver biopsy pending his course and lab workup - RUQ [place] next month - [name] screening - no EtOH, minimize NSAID use - schedule EGD and colonoscopy at the hospital for anesthesia support  Jolly Mango, MD [facility]

## 2022-08-12 NOTE — Patient Instructions (Signed)
Sweating increases with an amputation. Your body is trying to regulate your temperature & without an extremity, you sweat greater amount & quicker to cool off. Also prosthetic material like liners do not breath and add hot layers which causes even more sweating. With time your body typically will accommodate to prosthesis and your sweat level will come closer to level with amputation but not pre-amputation level.   You need to pat your limb & liner dry when you notice sweating. If you leave sweat trapped inside your liner, then it can result in a blister.   Signs of sweating in your liner: You are sweating elsewhere on your body or you notice sweat running / dripping.  Take note of how high your liner comes up on your limb when you first put your liner on your limb. If you notice that your liner has slipped down, then you probably have sweat inside your liner. A good time to check for liner slippage is when toileting.  You feel air bubbles inside your liner. When you liner slips, then air is allowed in bottom. As you put weight on prosthesis, the air is burp or pushed out. You feel something crawling or moving inside your liner. When sweat runs inside the closed system of liner, it often feels like a bug or something crawling inside your liner.  If any of above symptoms are noted, you need to remove your prosthesis & liner to pat your limb & liner dry. This is permanent need as leaving sweat or water trapped can result in a blister or wound.

## 2022-08-12 NOTE — Patient Instructions (Addendum)
_______________________________________________________  If you are age 61 or older, your body mass index should be between 23-30. Your Body mass index is 28.12 kg/m. If this is out of the aforementioned range listed, please consider follow up with your Primary Care Provider.  If you are age 75 or younger, your body mass index should be between 19-25. Your Body mass index is 28.12 kg/m. If this is out of the aformentioned range listed, please consider follow up with your Primary Care Provider.   ________________________________________________________  The [facility] providers would like to encourage you to use [facility] to communicate with providers for non-urgent requests or questions.  Due to long hold times on the telephone, sending your provider a message by [facility] may be a faster and more efficient way to get a response.  Please allow 48 business hours for a response.  Please remember that this is for non-urgent requests.  _______________________________________________________  Please go to the lab in the basement of our building to have lab work done as you leave today. Hit "B" for basement when you get on the elevator.  When the doors open the lab is on your left.  We will call you with the results. Thank you.  You have been scheduled for an abdominal ultrasound at [facility] (1st floor of hospital) on Friday, 10-6 at 8:30am. Please arrive 30 minutes prior to your appointment for registration. Make certain not to have anything to eat or drink 6 hours prior to your appointment. Should you need to reschedule your appointment, please contact radiology at [phone]. This test typically takes about 30 minutes to perform.  You have been scheduled for an endoscopy and colonoscopy at [facility] on Oct 16th.  Please follow written instructions given to you at your visit today. If you use inhalers (even only as needed), please bring them with you on the day of your procedure.  PLEASE  BE SURE TO LET us KNOW IF YOU RESUME COUMADIN.  We are giving you a sample of Plenvu to use for your colonoscopy prep.  Thank you for entrusting me with your care and for choosing [facility], Dr. [place] Cellar

## 2022-08-15 ENCOUNTER — Other Ambulatory Visit: Payer: Self-pay

## 2022-08-15 ENCOUNTER — Emergency Department (HOSPITAL_COMMUNITY): Payer: BC Managed Care – PPO

## 2022-08-15 ENCOUNTER — Inpatient Hospital Stay (HOSPITAL_COMMUNITY): Payer: BC Managed Care – PPO | Admitting: Certified Registered Nurse Anesthetist

## 2022-08-15 ENCOUNTER — Encounter (HOSPITAL_COMMUNITY): Payer: Self-pay | Admitting: Internal Medicine

## 2022-08-15 ENCOUNTER — Encounter (HOSPITAL_COMMUNITY)
Admission: EM | Disposition: A | Discharge status: Home / Self Care | Payer: Self-pay | Source: Home / Self Care | Attending: Internal Medicine

## 2022-08-15 ENCOUNTER — Inpatient Hospital Stay (HOSPITAL_COMMUNITY)
Admission: EM | Admit: 2022-08-15 | Discharge: 2022-08-16 | DRG: 660 | Disposition: A | Discharge status: Home / Self Care | Payer: BC Managed Care – PPO | Attending: Internal Medicine | Admitting: Internal Medicine

## 2022-08-15 DIAGNOSIS — Z7901 Long term (current) use of anticoagulants: Secondary | ICD-10-CM

## 2022-08-15 DIAGNOSIS — Z87820 Personal history of traumatic brain injury: Secondary | ICD-10-CM | POA: Diagnosis not present

## 2022-08-15 DIAGNOSIS — Z79891 Long term (current) use of opiate analgesic: Secondary | ICD-10-CM

## 2022-08-15 DIAGNOSIS — Z89511 Acquired absence of right leg below knee: Secondary | ICD-10-CM

## 2022-08-15 DIAGNOSIS — N138 Other obstructive and reflux uropathy: Secondary | ICD-10-CM | POA: Diagnosis not present

## 2022-08-15 DIAGNOSIS — N202 Calculus of kidney with calculus of ureter: Secondary | ICD-10-CM | POA: Diagnosis not present

## 2022-08-15 DIAGNOSIS — N139 Obstructive and reflux uropathy, unspecified: Secondary | ICD-10-CM | POA: Diagnosis not present

## 2022-08-15 DIAGNOSIS — Z87442 Personal history of urinary calculi: Secondary | ICD-10-CM | POA: Diagnosis not present

## 2022-08-15 DIAGNOSIS — Z79899 Other long term (current) drug therapy: Secondary | ICD-10-CM

## 2022-08-15 DIAGNOSIS — Z96653 Presence of artificial knee joint, bilateral: Secondary | ICD-10-CM | POA: Diagnosis present

## 2022-08-15 DIAGNOSIS — R1084 Generalized abdominal pain: Secondary | ICD-10-CM | POA: Diagnosis not present

## 2022-08-15 DIAGNOSIS — R110 Nausea: Secondary | ICD-10-CM | POA: Diagnosis present

## 2022-08-15 DIAGNOSIS — I120 Hypertensive chronic kidney disease with stage 5 chronic kidney disease or end stage renal disease: Secondary | ICD-10-CM | POA: Diagnosis present

## 2022-08-15 DIAGNOSIS — I10 Essential (primary) hypertension: Secondary | ICD-10-CM | POA: Diagnosis present

## 2022-08-15 DIAGNOSIS — N1330 Unspecified hydronephrosis: Secondary | ICD-10-CM | POA: Diagnosis not present

## 2022-08-15 DIAGNOSIS — G546 Phantom limb syndrome with pain: Secondary | ICD-10-CM | POA: Diagnosis not present

## 2022-08-15 DIAGNOSIS — N131 Hydronephrosis with ureteral stricture, not elsewhere classified: Principal | ICD-10-CM | POA: Diagnosis present

## 2022-08-15 DIAGNOSIS — N2581 Secondary hyperparathyroidism of renal origin: Secondary | ICD-10-CM | POA: Diagnosis not present

## 2022-08-15 DIAGNOSIS — N186 End stage renal disease: Secondary | ICD-10-CM | POA: Diagnosis present

## 2022-08-15 DIAGNOSIS — E039 Hypothyroidism, unspecified: Secondary | ICD-10-CM | POA: Diagnosis not present

## 2022-08-15 DIAGNOSIS — I700 Atherosclerosis of aorta: Secondary | ICD-10-CM | POA: Diagnosis not present

## 2022-08-15 DIAGNOSIS — I252 Old myocardial infarction: Secondary | ICD-10-CM | POA: Diagnosis not present

## 2022-08-15 DIAGNOSIS — M898X9 Other specified disorders of bone, unspecified site: Secondary | ICD-10-CM | POA: Diagnosis not present

## 2022-08-15 DIAGNOSIS — D631 Anemia in chronic kidney disease: Secondary | ICD-10-CM | POA: Diagnosis present

## 2022-08-15 DIAGNOSIS — Z888 Allergy status to other drugs, medicaments and biological substances status: Secondary | ICD-10-CM

## 2022-08-15 DIAGNOSIS — N132 Hydronephrosis with renal and ureteral calculous obstruction: Secondary | ICD-10-CM | POA: Diagnosis not present

## 2022-08-15 DIAGNOSIS — I482 Chronic atrial fibrillation, unspecified: Secondary | ICD-10-CM | POA: Diagnosis present

## 2022-08-15 DIAGNOSIS — Z8674 Personal history of sudden cardiac arrest: Secondary | ICD-10-CM | POA: Diagnosis not present

## 2022-08-15 DIAGNOSIS — Z89422 Acquired absence of other left toe(s): Secondary | ICD-10-CM | POA: Diagnosis not present

## 2022-08-15 DIAGNOSIS — K219 Gastro-esophageal reflux disease without esophagitis: Secondary | ICD-10-CM | POA: Diagnosis not present

## 2022-08-15 DIAGNOSIS — K7460 Unspecified cirrhosis of liver: Secondary | ICD-10-CM | POA: Diagnosis present

## 2022-08-15 DIAGNOSIS — Z885 Allergy status to narcotic agent status: Secondary | ICD-10-CM | POA: Diagnosis not present

## 2022-08-15 DIAGNOSIS — Z992 Dependence on renal dialysis: Secondary | ICD-10-CM | POA: Diagnosis not present

## 2022-08-15 DIAGNOSIS — I4891 Unspecified atrial fibrillation: Secondary | ICD-10-CM | POA: Diagnosis present

## 2022-08-15 DIAGNOSIS — M109 Gout, unspecified: Secondary | ICD-10-CM | POA: Diagnosis not present

## 2022-08-15 DIAGNOSIS — N2889 Other specified disorders of kidney and ureter: Secondary | ICD-10-CM | POA: Diagnosis not present

## 2022-08-15 DIAGNOSIS — R109 Unspecified abdominal pain: Secondary | ICD-10-CM | POA: Diagnosis not present

## 2022-08-15 DIAGNOSIS — N200 Calculus of kidney: Secondary | ICD-10-CM

## 2022-08-15 HISTORY — DX: Chronic atrial fibrillation, unspecified: I48.20

## 2022-08-15 HISTORY — PX: CYSTOSCOPY WITH STENT PLACEMENT: SHX5790

## 2022-08-15 LAB — COMPREHENSIVE METABOLIC PANEL
ALT: 16 U/L (ref 0–44)
AST: 21 U/L (ref 15–41)
Albumin: 3.5 g/dL (ref 3.5–5.0)
Alkaline Phosphatase: 73 U/L (ref 38–126)
Anion gap: 13 (ref 5–15)
BUN: 21 mg/dL (ref 8–23)
CO2: 23 mmol/L (ref 22–32)
Calcium: 9.2 mg/dL (ref 8.9–10.3)
Chloride: 102 mmol/L (ref 98–111)
Creatinine, Ser: 3.64 mg/dL — ABNORMAL HIGH (ref 0.61–1.24)
GFR, Estimated: 18 mL/min — ABNORMAL LOW (ref >60)
Glucose, Bld: 136 mg/dL — ABNORMAL HIGH (ref 70–99)
Potassium: 3.4 mmol/L — ABNORMAL LOW (ref 3.5–5.1)
Sodium: 138 mmol/L (ref 135–145)
Total Bilirubin: 0.6 mg/dL (ref 0.3–1.2)
Total Protein: 6.6 g/dL (ref 6.5–8.1)

## 2022-08-15 LAB — URINALYSIS, ROUTINE W REFLEX MICROSCOPIC
Bilirubin Urine: NEGATIVE
Glucose, UA: NEGATIVE mg/dL
Hgb urine dipstick: NEGATIVE
Ketones, ur: NEGATIVE mg/dL
Leukocytes,Ua: NEGATIVE
Nitrite: NEGATIVE
Protein, ur: NEGATIVE mg/dL
Specific Gravity, Urine: 1.016 (ref 1.005–1.030)
pH: 6 (ref 5.0–8.0)

## 2022-08-15 LAB — GLUCOSE, CAPILLARY: Glucose-Capillary: 208 mg/dL — ABNORMAL HIGH (ref 70–99)

## 2022-08-15 LAB — LIPASE, BLOOD: Lipase: 30 U/L (ref 11–51)

## 2022-08-15 LAB — CBC
HCT: 26.6 % — ABNORMAL LOW (ref 39.0–52.0)
Hemoglobin: 8.9 g/dL — ABNORMAL LOW (ref 13.0–17.0)
MCH: 33.3 pg (ref 26.0–34.0)
MCHC: 33.5 g/dL (ref 30.0–36.0)
MCV: 99.6 fL (ref 80.0–100.0)
Platelets: 183 10*3/uL (ref 150–400)
RBC: 2.67 MIL/uL — ABNORMAL LOW (ref 4.22–5.81)
RDW: 19.4 % — ABNORMAL HIGH (ref 11.5–15.5)
WBC: 8.4 10*3/uL (ref 4.0–10.5)
nRBC: 0 % (ref 0.0–0.2)

## 2022-08-15 LAB — HEPATITIS B SURFACE ANTIBODY,QUALITATIVE: Hep B S Ab: REACTIVE — AB

## 2022-08-15 LAB — HEPATITIS B SURFACE ANTIGEN: Hepatitis B Surface Ag: NONREACTIVE

## 2022-08-15 LAB — HEPATITIS C ANTIBODY: HCV Ab: NONREACTIVE

## 2022-08-15 LAB — HEPATITIS B CORE ANTIBODY, TOTAL: Hep B Core Total Ab: NONREACTIVE

## 2022-08-15 SURGERY — CYSTOSCOPY, WITH STENT INSERTION
Anesthesia: General | Site: Ureter | Laterality: Bilateral

## 2022-08-15 MED ORDER — HEPARIN SODIUM (PORCINE) 1000 UNIT/ML IJ SOLN
INTRAMUSCULAR | Status: AC
  Administered 2022-08-15: 1000 [IU]
  Filled 2022-08-15: qty 4

## 2022-08-15 MED ORDER — PHENYLEPHRINE HCL (PRESSORS) 10 MG/ML IV SOLN
INTRAVENOUS | Status: AC
  Filled 2022-08-15: qty 1

## 2022-08-15 MED ORDER — HYDRALAZINE HCL 20 MG/ML IJ SOLN: 5.0000 mg | INTRAMUSCULAR | Status: DC | PRN

## 2022-08-15 MED ORDER — LIDOCAINE 2% (20 MG/ML) 5 ML SYRINGE
INTRAMUSCULAR | Status: DC | PRN
  Administered 2022-08-15: 80 mg via INTRAVENOUS

## 2022-08-15 MED ORDER — DEXAMETHASONE SODIUM PHOSPHATE 10 MG/ML IJ SOLN
INTRAMUSCULAR | Status: DC | PRN
  Administered 2022-08-15: 5 mg via INTRAVENOUS

## 2022-08-15 MED ORDER — CEFAZOLIN SODIUM-DEXTROSE 2-3 GM-%(50ML) IV SOLR
INTRAVENOUS | Status: DC | PRN
  Administered 2022-08-15: 2 g via INTRAVENOUS

## 2022-08-15 MED ORDER — CEFAZOLIN SODIUM 1 G IJ SOLR
INTRAMUSCULAR | Status: AC
  Filled 2022-08-15: qty 20

## 2022-08-15 MED ORDER — PHENYLEPHRINE 80 MCG/ML (10ML) SYRINGE FOR IV PUSH (FOR BLOOD PRESSURE SUPPORT)
PREFILLED_SYRINGE | INTRAVENOUS | Status: AC
  Filled 2022-08-15: qty 10

## 2022-08-15 MED ORDER — LIDOCAINE 2% (20 MG/ML) 5 ML SYRINGE
INTRAMUSCULAR | Status: AC
  Filled 2022-08-15: qty 5

## 2022-08-15 MED ORDER — PHENYLEPHRINE 80 MCG/ML (10ML) SYRINGE FOR IV PUSH (FOR BLOOD PRESSURE SUPPORT)
PREFILLED_SYRINGE | INTRAVENOUS | Status: DC | PRN
  Administered 2022-08-15: 120 ug via INTRAVENOUS
  Administered 2022-08-15: 160 ug via INTRAVENOUS
  Administered 2022-08-15: 80 ug via INTRAVENOUS
  Administered 2022-08-15: 120 ug via INTRAVENOUS
  Administered 2022-08-15: 80 ug via INTRAVENOUS

## 2022-08-15 MED ORDER — PROPOFOL 10 MG/ML IV BOLUS
INTRAVENOUS | Status: DC | PRN
  Administered 2022-08-15: 180 mg via INTRAVENOUS

## 2022-08-15 MED ORDER — RENA-VITE PO TABS
1.0000 | ORAL_TABLET | Freq: Every day | ORAL | Status: DC
  Administered 2022-08-16: 1 via ORAL
  Filled 2022-08-15: qty 1

## 2022-08-15 MED ORDER — CHLORHEXIDINE GLUCONATE CLOTH 2 % EX PADS
6.0000 | MEDICATED_PAD | Freq: Every day | CUTANEOUS | Status: DC
  Administered 2022-08-16: 6 via TOPICAL

## 2022-08-15 MED ORDER — IOPAMIDOL (ISOVUE-300) INJECTION 61%
INTRAVENOUS | Status: DC | PRN
  Administered 2022-08-15: 25 mL via URETHRAL

## 2022-08-15 MED ORDER — ACETAMINOPHEN 325 MG PO TABS: 650.0000 mg | ORAL_TABLET | Freq: Four times a day (QID) | ORAL | Status: DC | PRN

## 2022-08-15 MED ORDER — SORBITOL 70 % SOLN: 30.0000 mL | Status: DC | PRN

## 2022-08-15 MED ORDER — ONDANSETRON HCL 4 MG/2ML IJ SOLN
INTRAMUSCULAR | Status: DC | PRN
  Administered 2022-08-15: 4 mg via INTRAVENOUS

## 2022-08-15 MED ORDER — ACETAMINOPHEN 10 MG/ML IV SOLN: 1000.0000 mg | Freq: Once | INTRAVENOUS | Status: DC | PRN

## 2022-08-15 MED ORDER — ONDANSETRON HCL 4 MG PO TABS: 4.0000 mg | ORAL_TABLET | Freq: Four times a day (QID) | ORAL | Status: DC | PRN

## 2022-08-15 MED ORDER — DEXAMETHASONE SODIUM PHOSPHATE 10 MG/ML IJ SOLN
INTRAMUSCULAR | Status: AC
  Filled 2022-08-15: qty 1

## 2022-08-15 MED ORDER — PROPOFOL 10 MG/ML IV BOLUS
INTRAVENOUS | Status: AC
  Filled 2022-08-15: qty 20

## 2022-08-15 MED ORDER — HEPARIN SODIUM (PORCINE) 5000 UNIT/ML IJ SOLN
5000.0000 [IU] | Freq: Three times a day (TID) | INTRAMUSCULAR | Status: DC
  Administered 2022-08-15 – 2022-08-16 (×2): 5000 [IU] via SUBCUTANEOUS
  Filled 2022-08-15: qty 1

## 2022-08-15 MED ORDER — SODIUM CHLORIDE 0.9 % IV SOLN: INTRAVENOUS | Status: DC

## 2022-08-15 MED ORDER — FENTANYL CITRATE (PF) 100 MCG/2ML IJ SOLN: 25.0000 ug | INTRAMUSCULAR | Status: DC | PRN

## 2022-08-15 MED ORDER — ONDANSETRON HCL 4 MG/2ML IJ SOLN
INTRAMUSCULAR | Status: AC
  Filled 2022-08-15: qty 2

## 2022-08-15 MED ORDER — DOCUSATE SODIUM 283 MG RE ENEM: 1.0000 | ENEMA | RECTAL | Status: DC | PRN

## 2022-08-15 MED ORDER — ONDANSETRON HCL 4 MG/2ML IJ SOLN
4.0000 mg | Freq: Once | INTRAMUSCULAR | Status: AC
  Administered 2022-08-15: 4 mg via INTRAVENOUS
  Filled 2022-08-15: qty 2

## 2022-08-15 MED ORDER — ORAL CARE MOUTH RINSE: 15.0000 mL | Freq: Once | OROMUCOSAL | Status: AC

## 2022-08-15 MED ORDER — CALCIUM CARBONATE ANTACID 1250 MG/5ML PO SUSP: 500.0000 mg | Freq: Four times a day (QID) | ORAL | Status: DC | PRN

## 2022-08-15 MED ORDER — ACETAMINOPHEN 650 MG RE SUPP: 650.0000 mg | Freq: Four times a day (QID) | RECTAL | Status: DC | PRN

## 2022-08-15 MED ORDER — FENTANYL CITRATE (PF) 250 MCG/5ML IJ SOLN
INTRAMUSCULAR | Status: DC | PRN
  Administered 2022-08-15 (×2): 50 ug via INTRAVENOUS

## 2022-08-15 MED ORDER — CAMPHOR-MENTHOL 0.5-0.5 % EX LOTN: 1.0000 | TOPICAL_LOTION | Freq: Three times a day (TID) | CUTANEOUS | Status: DC | PRN

## 2022-08-15 MED ORDER — ALLOPURINOL 100 MG PO TABS
100.0000 mg | ORAL_TABLET | Freq: Every day | ORAL | Status: DC
  Administered 2022-08-16: 100 mg via ORAL
  Filled 2022-08-15: qty 1

## 2022-08-15 MED ORDER — HYDROXYZINE HCL 25 MG PO TABS: 25.0000 mg | ORAL_TABLET | Freq: Three times a day (TID) | ORAL | Status: DC | PRN

## 2022-08-15 MED ORDER — TRAZODONE HCL 50 MG PO TABS
25.0000 mg | ORAL_TABLET | Freq: Every evening | ORAL | Status: DC | PRN
  Administered 2022-08-16: 50 mg via ORAL
  Filled 2022-08-15: qty 1

## 2022-08-15 MED ORDER — ONDANSETRON HCL 4 MG/2ML IJ SOLN: 4.0000 mg | Freq: Four times a day (QID) | INTRAMUSCULAR | Status: DC | PRN

## 2022-08-15 MED ORDER — NEPRO/CARBSTEADY PO LIQD: 237.0000 mL | Freq: Three times a day (TID) | ORAL | Status: DC | PRN

## 2022-08-15 MED ORDER — PANTOPRAZOLE SODIUM 40 MG PO TBEC
40.0000 mg | DELAYED_RELEASE_TABLET | Freq: Every day | ORAL | Status: DC
  Administered 2022-08-16: 40 mg via ORAL
  Filled 2022-08-15: qty 1

## 2022-08-15 MED ORDER — FENTANYL CITRATE (PF) 250 MCG/5ML IJ SOLN
INTRAMUSCULAR | Status: AC
  Filled 2022-08-15: qty 5

## 2022-08-15 MED ORDER — HYDROMORPHONE HCL 1 MG/ML IJ SOLN
0.5000 mg | Freq: Once | INTRAMUSCULAR | Status: AC
  Administered 2022-08-15: 0.5 mg via INTRAVENOUS
  Filled 2022-08-15: qty 1

## 2022-08-15 MED ORDER — CHLORHEXIDINE GLUCONATE 0.12 % MT SOLN
15.0000 mL | Freq: Once | OROMUCOSAL | Status: AC
  Administered 2022-08-15: 15 mL via OROMUCOSAL
  Filled 2022-08-15: qty 15

## 2022-08-15 SURGICAL SUPPLY — 18 items
BAG COUNTER SPONGE SURGICOUNT (BAG) ×1 IMPLANT
BAG SPNG CNTER NS LX DISP (BAG) ×1
BAG URO CATCHER STRL LF (MISCELLANEOUS) ×1 IMPLANT
CATH INTERMIT  6FR 70CM (CATHETERS) ×1 IMPLANT
ELECT REM PT RETURN 9FT ADLT (ELECTROSURGICAL)
ELECTRODE REM PT RTRN 9FT ADLT (ELECTROSURGICAL) IMPLANT
FIBER LASER TRAC TIP (UROLOGICAL SUPPLIES) ×1 IMPLANT
GLOVE BIOGEL M STRL SZ7.5 (GLOVE) ×1 IMPLANT
GOWN STRL REUS W/ TWL LRG LVL3 (GOWN DISPOSABLE) ×2 IMPLANT
GOWN STRL REUS W/TWL LRG LVL3 (GOWN DISPOSABLE) ×2
GUIDEWIRE ANG ZIPWIRE 038X150 (WIRE) IMPLANT
GUIDEWIRE STR DUAL SENSOR (WIRE) ×1 IMPLANT
MANIFOLD NEPTUNE II (INSTRUMENTS) ×2 IMPLANT
PACK CYSTO (CUSTOM PROCEDURE TRAY) ×1 IMPLANT
SHEATH URETERAL 12FRX35CM (MISCELLANEOUS) IMPLANT
SOL PREP POV-IOD 4OZ 10% (MISCELLANEOUS) ×1 IMPLANT
STENT URET 6FRX26 CONTOUR (STENTS) IMPLANT
TUBE CONNECTING 12X1/4 (SUCTIONS) ×1 IMPLANT

## 2022-08-15 NOTE — Anesthesia Procedure Notes (Signed)
Procedure Name: LMA Insertion Date/Time: 08/15/2022 12:08 PM  Performed by: Betha Loa, CRNAPre-anesthesia Checklist: Patient identified, Emergency Drugs available, Suction available and Patient being monitored Patient Re-evaluated:Patient Re-evaluated prior to induction Oxygen Delivery Method: Circle System Utilized Preoxygenation: Pre-oxygenation with 100% oxygen Induction Type: IV induction Ventilation: Mask ventilation without difficulty LMA: LMA inserted LMA Size: 4.0 Number of attempts: 1 Placement Confirmation: positive ETCO2 Tube secured with: Tape Dental Injury: Teeth and Oropharynx as per pre-operative assessment

## 2022-08-15 NOTE — ED Provider Notes (Signed)
[facility] EMERGENCY DEPARTMENT Provider Note  CSN: [id number] Arrival date & time: 08/15/22 4481  Chief Complaint(s) Abdominal Pain  HPI Jeffrey Ayala is a 61 y.o. male with history of cirrhosis, esophagitis, end-stage renal disease on dialysis, admission in April for sepsis complicated by lower extremity amputations for dry gangrene,, Fournier's gangrene presenting to the emergency department with abdominal pain.  Patient reports acute onset of abdominal pain beginning this morning.  He reports nausea, otherwise no fevers or chills, no diarrhea, no melena hematochezia, no vomiting.  Denies chest pain or shortness of breath.  He reports his symptoms are moderate.  He denies any urinary symptoms such as dysuria.   Past Medical History Past Medical History:  Diagnosis Date   A-fib ([place])    Cirrhosis of liver ([place])    Concussion    Eosinophilic esophagitis    EGD/bx by Dr. Havery Moros   ESRD needing dialysis [facility])    Gastric ulcer    GERD (gastroesophageal reflux disease)    History of gastric ulcer    History of kidney stones    Intermittent dysphagia    Migraine    Patient Active Problem List   Diagnosis Date Noted   Obstructive nephropathy 08/15/2022   Skin breakdown 07/29/2022   Suicidal ideations 06/09/2022   Chills (without fever) 06/09/2022   Severe depression ([place]) 06/09/2022   Acute cough 06/09/2022   Reactive depression 05/31/2022   Belching 05/27/2022   Gastroesophageal reflux disease 05/27/2022   Atrial fibrillation ([place]) 05/20/2022   Long term (current) use of anticoagulants 05/20/2022   S/P transmetatarsal amputation of foot, left ([place]) 04/18/2022   CKD (chronic kidney disease) stage 5, GFR less than 15 ml/min ([place]) 04/18/2022   Secondary hyperparathyroidism of renal origin ([place]) 04/10/2022   Pruritus, unspecified 04/10/2022   Nausea 04/10/2022   Iron deficiency anemia, unspecified 04/10/2022   Hypothyroidism, unspecified 04/10/2022    Other acute kidney failure ([place]) 04/10/2022   Dyspnea, unspecified 85/63/1497   Eosinophilic esophagitis 02/63/7858   Gastric ulcer, unspecified as acute or chronic, without hemorrhage or perforation 04/10/2022   Coagulation defect, unspecified ([place]) 04/10/2022   Allergy, unspecified, initial encounter 04/10/2022   Anaphylactic shock, unspecified, initial encounter 04/10/2022   Arthralgia of right lower leg    Acute blood loss anemia    Unilateral complete BKA, right, initial encounter ([place]) 04/03/2022   S/P BKA (below knee amputation), right ([place]) 04/03/2022   Old myocardial infarction 04/02/2022   Dependence on renal dialysis ([place]) 04/02/2022   Acquired absence of right leg below knee ([place]) 04/02/2022   Cirrhosis ([place]) 03/31/2022   Fournier's gangrene in male    Scrotal infection 03/25/2022   Normocytic anemia 03/25/2022   Diarrhea 03/25/2022   Shock liver 03/25/2022   Acute respiratory failure with hypoxia ([place]) 03/25/2022   Gout 03/25/2022   Acute nonintractable headache 03/25/2022   Demand ischemia ([place]) 03/25/2022   Thrombocytopenia ([place]) 03/25/2022   Obesity, Class III, BMI 40-49.9 (morbid obesity) ([place]) 03/25/2022   Cardiac arrest ([place]) 03/25/2022   Hypotension    Respiratory insufficiency    Cellulitis    Malnutrition of moderate degree 03/12/2022   Altered mental status    Gangrene of right foot ([place])    Gangrene of left foot ([place])    Severe protein-calorie malnutrition ([place]) 03/10/2022   Encounter for central line placement    Pressure injury of skin 02/23/2022   Septic shock ([place]) 02/22/2022   AKI (acute kidney injury) ([place])    Generalized abdominal pain  Lactic acidosis    Atrial fibrillation with RVR ([name])    Hypokalemia    Hypomagnesemia    Pain in finger of right hand 08/14/2021   Degenerative joint disease of shoulder region 05/07/2020   Olecranon bursitis of right elbow 05/07/2020   Pain in joint of right shoulder 04/26/2020   S/P TKR (total knee  replacement), bilateral 11/04/2018   Bilateral primary osteoarthritis of knee    DDD (degenerative disc disease), cervical 01/10/2018   Rupture of medial head of gastrocnemius, left, sequela 01/14/2017   Kidney stones 06/22/2016   Essential hypertension 08/01/2010   DYSTHYMIC DISORDER 07/08/2010   PALPITATIONS 07/08/2010   Home Medication(s) Prior to Admission medications   Medication Sig Start Date End Date Taking? Authorizing Provider  acetaminophen (TYLENOL) 650 MG CR tablet Take 650 mg by mouth every 8 (eight) hours as needed for pain or fever.   Yes [provider]  allopurinol (ZYLOPRIM) 100 MG tablet Take 1 tablet (100 mg total) by mouth daily. 05/27/22  Yes Henson, Vickie L, NP-C  pantoprazole (PROTONIX) 40 MG tablet Take 1 tablet (40 mg total) by mouth 2 (two) times daily. Patient taking differently: Take 40 mg by mouth daily. 05/27/22  Yes Zehr, Laban Emperor, PA-C  traZODone (DESYREL) 50 MG tablet TAKE 1/2 OR 1 TABLET BY MOUTH AT BEDTIME AS NEEDED FOR SLEEP Patient taking differently: Take 25-50 mg by mouth at bedtime as needed for sleep. 07/10/22  Yes Meredith Staggers, MD  escitalopram (LEXAPRO) 10 MG tablet TAKE 1 TABLET BY MOUTH EVERY DAY Patient not taking: Reported on 08/15/2022 07/03/22   Harland Dingwall L, NP-C  multivitamin (RENA-VIT) TABS tablet Take 1 tablet by mouth at bedtime. Patient not taking: Reported on 08/15/2022 04/14/22   Cathlyn Parsons, PA-C  PEG-KCl-NaCl-NaSulf-Na Asc-C (PLENVU) 140 g SOLR Use as directed 08/12/22   Yetta Flock, MD  warfarin (COUMADIN) 5 MG tablet TAKE 1 TABLET (5 MG TOTAL) BY MOUTH DAILY. Patient not taking: Reported on 08/15/2022 07/03/22   Geralynn Rile, MD                                                                                                                                    Past Surgical History Past Surgical History:  Procedure Laterality Date   AMPUTATION Right 03/15/2022   Procedure: RIGHT BELOW KNEE  AMPUTATION;  Surgeon: [name], MD;  Location: [place];  Service: Orthopedics;  Laterality: Right;   CYSTOSCOPY     DENTAL SURGERY     FINGER SURGERY     HERNIA REPAIR     at age 46   IR FLUORO GUIDE CV LINE RIGHT  03/09/2022   IR PATIENT EVAL TECH 0-60 MINS  03/09/2022   IR US GUIDE VASC ACCESS RIGHT  03/09/2022   KNEE ARTHROSCOPY Left    LITHOTRIPSY     NEPHROLITHOTOMY     SCROTAL EXPLORATION N/A 03/16/2022  Procedure: IRRIGATION AND DEBRIDEMENT SCROTUM;  Surgeon: Raynelle Bring, MD;  Location: [place];  Service: Urology;  Laterality: N/A;   SCROTAL EXPLORATION N/A 03/28/2022   Procedure: IRRIGATION AND DEBRIDEMENT SCROTUM;  Surgeon: Ardis Hughs, MD;  Location: [place];  Service: Urology;  Laterality: N/A;   TONSILLECTOMY     TOTAL KNEE ARTHROPLASTY Bilateral 11/04/2018   Procedure: BILATERAL TOTAL KNEE ARTHROPLASTY;  Surgeon: [name], MD;  Location: [place];  Service: Orthopedics;  Laterality: Bilateral;  spinal/epidural per anesthesiologist   TRANSMETATARSAL AMPUTATION Left 03/15/2022   Procedure: LEFT TRANSMETATARSAL AMPUTATION;  Surgeon: [name], MD;  Location: [place];  Service: Orthopedics;  Laterality: Left;   Family History Family History  Problem Relation Age of Onset   Arrhythmia Mother    COPD Mother    Sarcoidosis Brother    Cancer Brother    Stomach cancer Neg Hx    Pancreatic cancer Neg Hx    Esophageal cancer Neg Hx    Colon cancer Neg Hx    Rectal cancer Neg Hx     Social History Social History   Tobacco Use   Smoking status: Never   Smokeless tobacco: Never  Vaping Use   Vaping Use: Never used  Substance Use Topics   Alcohol use: Not Currently    Comment: occassional   Drug use: No   Allergies Codeine and Flomax [tamsulosin hcl]  Review of Systems Review of Systems  All other systems reviewed and are negative.   Physical Exam Vital Signs  I have reviewed the triage vital signs BP 123/73 (BP Location: Left Arm)   Pulse 95    Temp 98 F (36.7 C) (Oral)   Resp 14   Ht '5\' 10"'$  (1.778 m)   Wt 90.7 kg   SpO2 100%   BMI 28.70 kg/m  Physical Exam Vitals and nursing note reviewed.  Constitutional:      General: He is not in acute distress.    Appearance: Normal appearance.  HENT:     Mouth/Throat:     Mouth: Mucous membranes are moist.  Eyes:     Conjunctiva/sclera: Conjunctivae normal.  Cardiovascular:     Rate and Rhythm: Normal rate and regular rhythm.  Pulmonary:     Effort: Pulmonary effort is normal. No respiratory distress.     Breath sounds: Normal breath sounds.  Abdominal:     General: Abdomen is flat.     Palpations: Abdomen is soft.     Tenderness: There is generalized abdominal tenderness (mild).  Musculoskeletal:     Right lower leg: No edema.     Left lower leg: No edema.  Skin:    General: Skin is warm and dry.     Capillary Refill: Capillary refill takes less than 2 seconds.  Neurological:     Mental Status: He is alert and oriented to person, place, and time. Mental status is at baseline.  Psychiatric:        Mood and Affect: Mood normal.        Behavior: Behavior normal.     ED Results and Treatments Labs (all labs ordered are listed, but only abnormal results are displayed) Labs Reviewed  COMPREHENSIVE METABOLIC PANEL - Abnormal; Notable for the following components:      Result Value   Potassium 3.4 (*)    Glucose, Bld 136 (*)    Creatinine, Ser 3.64 (*)    GFR, Estimated 18 (*)    All other components within normal limits  CBC - Abnormal; Notable for the following components:   RBC 2.67 (*)    Hemoglobin 8.9 (*)    HCT 26.6 (*)    RDW 19.4 (*)    All other components within normal limits  LIPASE, BLOOD  URINALYSIS, ROUTINE W REFLEX MICROSCOPIC                                                                                                                          Radiology CT Abdomen Pelvis Wo Contrast  Result Date: 08/15/2022 CLINICAL DATA:  61 year old male  with acute abdominal and pelvic pain. EXAM: CT ABDOMEN AND PELVIS WITHOUT CONTRAST TECHNIQUE: Multidetector CT imaging of the abdomen and pelvis was performed following the standard protocol without IV contrast. RADIATION DOSE REDUCTION: This exam was performed according to the departmental dose-optimization program which includes automated exposure control, adjustment of the mA and/or kV according to patient size and/or use of iterative reconstruction technique. COMPARISON:  03/31/2022 and prior CTs FINDINGS: Please note that parenchymal and vascular abnormalities may be missed as intravenous contrast was not administered. Lower chest: No acute abnormality. Hepatobiliary: Equivocal mild contour irregularity of the liver is noted. No other hepatic abnormalities are identified. The gallbladder is unremarkable. There is no evidence of intrahepatic or extrahepatic biliary dilatation. Pancreas: Unremarkable Spleen: Splenomegaly again identified. Adrenals/Urinary Tract: A 9 mm RIGHT UPJ calculus causes moderate to severe RIGHT hydronephrosis. An 8 mm LEFT UPJ calculus causes moderate to severe LEFT hydronephrosis. Multiple nonobstructing bilateral renal calculi are again identified. A 5 mm calculus is noted within the dependent portion of the LEFT renal pelvis. The adrenal glands and bladder are within normal limits. Stomach/Bowel: Stomach is within normal limits. Appendix appears normal. No evidence of bowel wall thickening, distention, or inflammatory changes. Vascular/Lymphatic: Aortic atherosclerosis. No enlarged abdominal or pelvic lymph nodes. Reproductive: Prostate is unchanged without definite abnormality. Other: No ascites, focal collection or pneumoperitoneum. Musculoskeletal: No acute or suspicious bony abnormalities are noted. Lumbar spine degenerative changes again noted. IMPRESSION: 1. Bilateral UPJ calculi (9 mm on the RIGHT and 8 mm on the LEFT) causing moderate to severe bilateral hydronephrosis. 2.  Nonobstructing bilateral renal calculi. A 5 mm calculus is present within the LEFT renal pelvis. 3. Equivocal mild contour irregularity of the liver which may represent cirrhosis. 4. Splenomegaly. 5. Aortic Atherosclerosis (ICD10-I70.0). Electronically Signed   By: Margarette Canada M.D.   On: 08/15/2022 09:29    Pertinent labs & imaging results that were available during my care of the patient were reviewed by me and considered in my medical decision making (see MDM for details).  Medications Ordered in ED Medications  ondansetron (ZOFRAN) injection 4 mg (4 mg Intravenous Given 08/15/22 0939)  HYDROmorphone (DILAUDID) injection 0.5 mg (0.5 mg Intravenous Given 08/15/22 0938)  Procedures Procedures  (including critical care time)  Medical Decision Making / ED Course   MDM:  61 year old male presenting to the emergency department with abdominal pain.  Patient in no acute distress, abdominal exam without focal tenderness or peritoneal signs.  Differential includes colitis, abscess, perforation, obstruction, recurrent infection.  Urinalysis not suspicious for urinary source of symptoms.  Patient afebrile.  Will obtain CT scan to further evaluate.  Will treat pain and reassess.  Clinical Course as of 08/15/22 1059  Sat Aug 15, 2022  1021 Discussed with Dr. Para March of Urology who will place stents given bilateral stones. Will consult nephrology given ESRD and missing HD today and consult medicine for admission.  [WS]  1057 Discussed with Dr. Lorin Mercy, who will admit the patient.  [WS]    Clinical Course User Index [WS] Cristie Hem, MD     Additional history obtained: -Additional history obtained from spouse -External records from outside source obtained and reviewed including: Chart review including previous notes, labs, imaging, consultation notes   Lab  Tests: -I ordered, reviewed, and interpreted labs.   The pertinent results include:   Labs Reviewed  COMPREHENSIVE METABOLIC PANEL - Abnormal; Notable for the following components:      Result Value   Potassium 3.4 (*)    Glucose, Bld 136 (*)    Creatinine, Ser 3.64 (*)    GFR, Estimated 18 (*)    All other components within normal limits  CBC - Abnormal; Notable for the following components:   RBC 2.67 (*)    Hemoglobin 8.9 (*)    HCT 26.6 (*)    RDW 19.4 (*)    All other components within normal limits  LIPASE, BLOOD  URINALYSIS, ROUTINE W REFLEX MICROSCOPIC      Imaging Studies ordered: I ordered imaging studies including CT A/P On my interpretation imaging demonstrates bilateral obstructive uropathy I independently visualized and interpreted imaging. I agree with the radiologist interpretation   Medicines ordered and prescription drug management: Meds ordered this encounter  Medications   ondansetron (ZOFRAN) injection 4 mg   HYDROmorphone (DILAUDID) injection 0.5 mg    -I have reviewed the patients home medicines and have made adjustments as needed   Consultations Obtained: I requested consultation with the urologist,  and discussed lab and imaging findings as well as pertinent plan - they recommend: stenting   Cardiac Monitoring: The patient was maintained on a cardiac monitor.  I personally viewed and interpreted the cardiac monitored which showed an underlying rhythm of: NSR   Reevaluation: After the interventions noted above, I reevaluated the patient and found that they have improved  Co morbidities that complicate the patient evaluation  Past Medical History:  Diagnosis Date   A-fib ([place])    Cirrhosis of liver ([place])    Concussion    Eosinophilic esophagitis    EGD/bx by Dr. Havery Moros   ESRD needing dialysis [facility])    Gastric ulcer    GERD (gastroesophageal reflux disease)    History of gastric ulcer    History of kidney stones    Intermittent  dysphagia    Migraine       Dispostion: Admit    Final Clinical Impression(s) / ED Diagnoses Final diagnoses:  Acute bilateral obstructive uropathy  ESRD on dialysis [facility])  Generalized abdominal pain  Nausea     This chart was dictated using voice recognition software.  Despite best efforts to proofread,  errors can occur which can change the documentation meaning.  Cristie Hem, MD 08/15/22 1059

## 2022-08-15 NOTE — Anesthesia Preprocedure Evaluation (Signed)
Anesthesia Evaluation  Patient identified by MRN, date of birth, ID band Patient awake    Reviewed: Allergy & Precautions, NPO status , Patient's Chart, lab work & pertinent test results  Airway Mallampati: II  TM Distance: >3 FB Neck ROM: Full    Dental no notable dental hx.    Pulmonary neg pulmonary ROS,    Pulmonary exam normal        Cardiovascular hypertension, + Past MI  + dysrhythmias Atrial Fibrillation  Rhythm:Regular Rate:Normal     Neuro/Psych  Headaches, Depression    GI/Hepatic PUD, GERD  Medicated,(+) Cirrhosis       , Hepatitis -  Endo/Other  Hypothyroidism   Renal/GU ESRF and DialysisRenal disease  negative genitourinary   Musculoskeletal  (+) Arthritis , Osteoarthritis,    Abdominal Normal abdominal exam  (+)   Peds  Hematology  (+) Blood dyscrasia, anemia , Lab Results      Component                Value               Date                      WBC                      8.4                 08/15/2022                HGB                      8.9 (L)             08/15/2022                HCT                      26.6 (L)            08/15/2022                MCV                      99.6                08/15/2022                PLT                      183                 08/15/2022           Lab Results      Component                Value               Date                      NA                       138                 08/15/2022                K  3.4 (L)             08/15/2022                CO2                      23                  08/15/2022                GLUCOSE                  136 (H)             08/15/2022                BUN                      21                  08/15/2022                CREATININE               3.64 (H)            08/15/2022                CALCIUM                  9.2                 08/15/2022                GFRNONAA                 18 (L)               08/15/2022             Anesthesia Other Findings   Reproductive/Obstetrics                             Anesthesia Physical Anesthesia Plan  ASA: 3  Anesthesia Plan: General   Post-op Pain Management:    Induction: Intravenous  PONV Risk Score and Plan: 2 and Ondansetron, Dexamethasone, Midazolam and Treatment may vary due to age or medical condition  Airway Management Planned: Mask and LMA  Additional Equipment: None  Intra-op Plan:   Post-operative Plan: Extubation in OR  Informed Consent: I have reviewed the patients History and Physical, chart, labs and discussed the procedure including the risks, benefits and alternatives for the proposed anesthesia with the patient or authorized representative who has indicated his/her understanding and acceptance.     Dental advisory given  Plan Discussed with: CRNA  Anesthesia Plan Comments:         Anesthesia Quick Evaluation

## 2022-08-15 NOTE — Evaluation (Signed)
Occupational Therapy Evaluation and Discharge Patient Details Name: Jeffrey Ayala MRN: [id number] DOB: 11-17-1961 Today's Date: 08/15/2022   History of Present Illness 61 y/o male presented to ED on 08/15/22 for stomach pain. CT confirms bilateral proximal ureteral calculi with obstruction. S/p cystocopy with insertion of bilateral JJ stents on 9/23. PMH: hx of Fournier's gangrene, Afib, hx of R BKA, chronic renal insufficiency on HD.   Clinical Impression   Pt typically mod I for transfers from [facility] (toilet, bed, etc) wife assist with bathing and dressing on HD days and because Pt is hesitant to get in shower due to HD port/access. Today he was able to demonstrate transfers at min guard level (getting up and over squat pivot to [facility] with arm rests - probably more successful with RW and stand pivot). Otherwise at baseline for grooming, dressing, bathing. He and his wife have no questions and concerns about being able to complete ADL/IADL at home. Pt end goal is to be able to get back into his garage and work on cars. OT to sign off at this time as education complete and Pt at baseline. Continue outpatient therapy services already in place.     Recommendations for follow up therapy are one component of a multi-disciplinary discharge planning process, led by the attending physician.  Recommendations may be updated based on patient status, additional functional criteria and insurance authorization.   Follow Up Recommendations  No OT follow up    Assistance Recommended at Discharge Set up Supervision/Assistance  Patient can return home with the following A little help with walking and/or transfers;A lot of help with bathing/dressing/bathroom;Assistance with cooking/housework;Assist for transportation;Help with stairs or ramp for entrance    Functional Status Assessment  Patient has had a recent decline in their functional status and demonstrates the ability to make significant improvements in  function in a reasonable and predictable amount of time.  Equipment Recommendations  None recommended by OT (Pt has appropriate DME)    Recommendations for Other Services Other (comment) (continue OP therapy)     Precautions / Restrictions Precautions Precautions: Fall Restrictions Weight Bearing Restrictions: No      Mobility Bed Mobility Overal bed mobility: Modified Independent             General bed mobility comments: HOB elevated, use of rail to assist    Transfers Overall transfer level: Needs assistance Equipment used: None Transfers: Bed to chair/wheelchair/BSC     Squat pivot transfers: Min guard       General transfer comment: from bed to [facility], close guard - Pt might do better with RW and coming to stand      Balance Overall balance assessment: No apparent balance deficits (not formally assessed)                                         ADL either performed or assessed with clinical judgement   ADL Overall ADL's : At baseline                                       General ADL Comments: encouraged to use tub bench and real shower on non HD days     Vision Baseline Vision/History: 0 No visual deficits Ability to See in Adequate Light: 0 Adequate Patient Visual Report: No change from  baseline Vision Assessment?: No apparent visual deficits     Perception     Praxis      Pertinent Vitals/Pain Pain Assessment Pain Assessment: Faces Faces Pain Scale: No hurt Pain Intervention(s): Monitored during session     Hand Dominance Right   Extremity/Trunk Assessment Upper Extremity Assessment Upper Extremity Assessment: Overall WFL for tasks assessed   Lower Extremity Assessment Lower Extremity Assessment:  (hx of R BKA)   Cervical / Trunk Assessment Cervical / Trunk Assessment: Normal   Communication Communication Communication: No difficulties   Cognition Arousal/Alertness: Awake/alert Behavior During  Therapy: WFL for tasks assessed/performed, Flat affect Overall Cognitive Status: Within Functional Limits for tasks assessed                                       General Comments  wife present and very supportive, has a goal of getting mobile enough to get back in his garage and work on cars    Exercises     Shoulder Instructions      Home Living Family/patient expects to be discharged to:: Private residence Living Arrangements: Spouse/significant other Available Help at Discharge: Family;Available 24 hours/day Type of Home: House Home Access: Ramped entrance     Home Layout: Two level;Able to live on main level with bedroom/bathroom     Bathroom Shower/Tub: Sponge bathes at baseline (encouraged to start using tub bench)   Bathroom Toilet: Handicapped height Bathroom Accessibility: Yes How Accessible: Accessible via wheelchair Home Equipment: [place] (2 wheels);BSC/3in1;Tub bench;Hand held shower head;Wheelchair - Education officer, community - power;Other (comment) (sliding board)          Prior Functioning/Environment Prior Level of Function : Working/employed;Needs assist       Physical Assist : ADLs (physical)   ADLs (physical): Bathing;Dressing;IADLs Mobility Comments: WC, been working with OPPT and prosthetic with RW ADLs Comments: wife assists with bed bath, and initial dressing. He has been performing toilet transfers and clothing/peri care mod I from [facility] level        OT Problem List: Decreased activity tolerance      OT Treatment/Interventions:      OT Goals(Current goals can be found in the care plan section) Acute Rehab OT Goals Patient Stated Goal: get back to where he can be in his garage working on cars OT Goal Formulation: With patient/family Time For Goal Achievement: 08/29/22 Potential to Achieve Goals: Good  OT Frequency:      Co-evaluation              AM-PAC OT "6 Clicks" Daily Activity     Outcome Measure Help from  another person eating meals?: None Help from another person taking care of personal grooming?: A Little Help from another person toileting, which includes using toliet, bedpan, or urinal?: A Little Help from another person bathing (including washing, rinsing, drying)?: A Little Help from another person to put on and taking off regular upper body clothing?: A Little Help from another person to put on and taking off regular lower body clothing?: A Little 6 Click Score: 19   End of Session Equipment Utilized During Treatment: Gait belt Nurse Communication: Mobility status;Precautions  Activity Tolerance: Patient tolerated treatment well Patient left: in bed;with call bell/phone within reach;with family/visitor present  OT Visit Diagnosis: Muscle weakness (generalized) (M62.81)                Time: 5643-3295 OT Time Calculation (min):  28 min Charges:  OT General Charges $OT Visit: 1 Visit OT Evaluation $OT Eval Moderate Complexity: [place] OTR/L Acute Rehabilitation Services Office: [place] 08/15/2022, 4:01 PM

## 2022-08-15 NOTE — Consult Note (Signed)
H&P  Chief Complaint: Flank pain and kidney stones  History of Present Illness: Jeffrey Ayala is a 61 y.o. year old white male with prior history of Fournier's gangrene.  Has been previously followed by Dr. Alinda Money at [facility] urology and required numerous debridements back in April and May 2023and bilateral BKA.  Was noted on CT scan at that point to have bilateral nonobstructing stones.  He presented to the ER earlier today was abdominal pain with nausea and vomiting.  His CT urogram confirms bilateral proximal ureteral calculi (50m on R,863mon L)with obstruction.  Patient has chronic renal insufficiency which resulted in hemodialysis during his episode with the gangrene back in the spring but apparently has improved somewhat over the last few months.  Presents at this time undergo cystoscopy and insertion of bilateral JJ stents to preserve renal function and will ultimately need definitive management with likely ureteroscopy and laser lithotripsy once stabilized.  Past Medical History:  Diagnosis Date   A-fib ([facility]   Cir[place]osis of [place] ([facility] [place]   Concussion    Eosinophilic esophagitis    [facility]   Gastric ulcer    GERD (gastroesophageal reflux disease)    History of gastric ulcer    History of kidney stones    Intermittent dysphagia    Migraine     Past Surgical History:  Procedure Laterality Date   AMPUTATION Right 03/15/2022   Procedure: RIGHT BELOW KNEE AMPUTATION;  Surgeon: DuNewt MinionMD;  [place]ics;  Laterality: Right;   CYSTOSCOPY     DENTAL SURGERY     FINGER SURGERY     HERNIA REPAIR     at age [age] IR FLUORO GUIDE CV LINE RIGHT  03/09/2022   IR PATIENT EVAL TECH 0-60 MINS  03/09/2022   IR [place] VASC ACCESS RIGHT  03/09/2022   KNEE ARTHROSCOPY Left    LITHOTRIPSY     NEPHROLITHOTOMY     SCROTAL EXPLORATION N/A 03/16/2022   Procedure: IRRIGATION AND DEBRIDEMENT SCROTUM;  Surgeon: BoRaynelle BringMD;  [place] Service: Urology;  Laterality: N/A;   SCROTAL EXPLORATION N/A 03/28/2022   Procedure: IRRIGATION AND DEBRIDEMENT SCROTUM;  Surgeon: HeArdis HughsMD;  [place]rology;  Laterality: N/A;   TONSILLECTOMY     TOTAL KNEE ARTHROPLASTY Bilateral 11/04/2018   Procedure: BILATERAL TOTAL KNEE ARTHROPLASTY;  Surgeon: DuNewt MinionMD;  [place]: [place]ics;  Laterality: Bilateral;  spinal/epidural per anesthesiologist   TRANSMETATARSAL AMPUTATION Left 03/15/2022   Procedure: LEFT TRANSMETATARSAL AMPUTATION;  Surgeon: DuNewt MinionMD;  [place] Service: [place];  Laterality: Left;    Home Medications:  No current facility-administered medications on file prior to encounter.   Current Outpatient Medications on File Prior to Encounter  Medication Sig Dispense Refill   acetaminophen (TYLENOL) 650 MG CR tablet Take 650 mg by mouth every 8 (eight) hours as needed for pain or fever.     allopurinol (ZYLOPRIM) 100 MG tablet Take 1 tablet (100 mg total) by mouth daily. 30 tablet 3   pantoprazole (PROTONIX) 40 MG tablet Take 1 tablet (40 mg total) by mouth 2 (two) times daily. (Patient taking differently: Take 40 mg by mouth daily.) 180 tablet 3   traZODone (DESYREL) 50 MG tablet TAKE 1/2 OR 1 TABLET BY MOUTH AT BEDTIME AS NEEDED FOR SLEEP (Patient taking differently:  Take 25-50 mg by mouth at bedtime as needed for sleep.) 90 tablet 1   escitalopram (LEXAPRO) 10 MG tablet TAKE 1 TABLET BY MOUTH EVERY DAY (Patient not taking: Reported on 08/15/2022) 90 tablet 1   multivitamin (RENA-VIT) TABS tablet Take 1 tablet by mouth at bedtime. (Patient not taking: Reported on 08/15/2022) 30 tablet 0   PEG-KCl-NaCl-NaSulf-Na Asc-C (PLENVU) 140 g SOLR Use as directed 1 each 0   warfarin (COUMADIN) 5 MG tablet TAKE 1 TABLET (5 MG TOTAL) BY MOUTH DAILY. (Patient not taking: Reported on 08/15/2022) 30 tablet 0     Allergies:  Allergies   Allergen Reactions   Codeine Hives    Insomnia, jittery   Flomax [Tamsulosin Hcl] Nausea Only    Nausea and fatigue    Family History  Problem Relation Age of Onset   Arrhythmia Mother    COPD Mother    Sarcoidosis Brother    Cancer Brother    Stomach cancer Neg Hx    Pancreatic cancer Neg Hx    Esophageal cancer Neg Hx    Colon cancer Neg Hx    Rectal cancer Neg Hx     Social History:  reports that he has never smoked. He has never used smokeless tobacco. He reports that he does not currently use alcohol. He reports that he does not use drugs.  ROS: A complete review of systems was performed.  All systems are negative except for pertinent findings as noted.  Physical Exam:  Vital signs in last 24 hours: Temp:  [98 F (36.7 C)-98.5 F (36.9 C)] 98 F (36.7 C) (09/23 1043) Pulse Rate:  [88-100] 95 (09/23 1043) Resp:  [14-18] 14 (09/23 1043) BP: (121-135)/(71-84) 123/73 (09/23 1043) SpO2:  [98 %-100 %] 100 % (09/23 1043) Weight:  [90.7 kg] 90.7 kg (09/23 0712) Constitutional:  Alert and oriented, No acute distress Cardiovascular: Regular rate and rhythm Respiratory: Normal respiratory effort, Lungs clear bilaterally GI: Abdomen is soft, nontender, nondistended, no abdominal masses FB:PZWCH CVAT Lymphatic: No lymphadenopathy Neurologic: Grossly intact, no focal deficits Psychiatric: Normal mood and affect   Laboratory Data:  Recent Labs    08/15/22 0720  WBC 8.4  HGB 8.9*  HCT 26.6*  PLT 183    Recent Labs    08/15/22 0720  NA 138  K 3.4*  CL 102  GLUCOSE 136*  BUN 21  CALCIUM 9.2  CREATININE 3.64*     Results for orders placed or performed during the hospital encounter of 08/15/22 (from the past 24 hour(s))  Urinalysis, Routine w reflex microscopic     Status: None   Collection Time: 08/15/22  7:16 AM  Result Value Ref Range   Color, Urine YELLOW YELLOW   APPearance CLEAR CLEAR   Specific Gravity, Urine 1.016 1.005 - 1.030   pH 6.0 5.0 - 8.0    Glucose, UA NEGATIVE NEGATIVE mg/dL   Hgb urine dipstick NEGATIVE NEGATIVE   Bilirubin Urine NEGATIVE NEGATIVE   Ketones, ur NEGATIVE NEGATIVE mg/dL   Protein, ur NEGATIVE NEGATIVE mg/dL   Nitrite NEGATIVE NEGATIVE   Leukocytes,Ua NEGATIVE NEGATIVE  Lipase, blood     Status: None   Collection Time: 08/15/22  7:20 AM  Result Value Ref Range   Lipase 30 11 - 51 U/L  Comprehensive metabolic panel     Status: Abnormal   Collection Time: 08/15/22  7:20 AM  Result Value Ref Range   Sodium 138 135 - 145 mmol/L   Potassium 3.4 (L) 3.5 - 5.1 mmol/L  Chloride 102 98 - 111 mmol/L   CO2 23 22 - 32 mmol/L   Glucose, Bld 136 (H) 70 - 99 mg/dL   BUN 21 8 - 23 mg/dL   Creatinine, Ser 3.64 (H) 0.61 - 1.24 mg/dL   Calcium 9.2 8.9 - 10.3 mg/dL   Total Protein 6.6 6.5 - 8.1 g/dL   Albumin 3.5 3.5 - 5.0 g/dL   AST 21 15 - 41 U/L   ALT 16 0 - 44 U/L   Alkaline Phosphatase 73 38 - 126 U/L   Total Bilirubin 0.6 0.3 - 1.2 mg/dL   GFR, Estimated 18 (L) >60 mL/min   Anion gap 13 5 - 15  CBC     Status: Abnormal   Collection Time: 08/15/22  7:20 AM  Result Value Ref Range   WBC 8.4 4.0 - 10.5 K/uL   RBC 2.67 (L) 4.22 - 5.81 MIL/uL   Hemoglobin 8.9 (L) 13.0 - 17.0 g/dL   HCT 26.6 (L) 39.0 - 52.0 %   MCV 99.6 80.0 - 100.0 fL   MCH 33.3 26.0 - 34.0 pg   MCHC 33.5 30.0 - 36.0 g/dL   RDW 19.4 (H) 11.5 - 15.5 %   Platelets 183 150 - 400 K/uL   nRBC 0.0 0.0 - 0.2 %   No results found for this or any previous visit (from the past 240 hour(s)).  Renal Function: Recent Labs    08/15/22 0720  CREATININE 3.64*   Estimated Creatinine Clearance: 24.1 mL/min (A) (by C-G formula based on SCr of 3.64 mg/dL (H)).  Radiologic Imaging: CT Abdomen Pelvis Wo Contrast  Result Date: 08/15/2022 CLINICAL DATA:  61 year old male with acute abdominal and pelvic pain. EXAM: CT ABDOMEN AND PELVIS WITHOUT CONTRAST TECHNIQUE: Multidetector CT imaging of the abdomen and pelvis was performed following the standard  protocol without IV contrast. RADIATION DOSE REDUCTION: This exam was performed according to the departmental dose-optimization program which includes automated exposure control, adjustment of the mA and/or kV according to patient size and/or use of iterative reconstruction technique. COMPARISON:  03/31/2022 and prior CTs FINDINGS: Please note that parenchymal and vascular abnormalities may be missed as intravenous contrast was not administered. Lower chest: No acute abnormality. Hepatobiliary: Equivocal mild contour irregularity of the liver is noted. No other hepatic abnormalities are identified. The gallbladder is unremarkable. There is no evidence of intrahepatic or extrahepatic biliary dilatation. Pancreas: Unremarkable Spleen: Splenomegaly again identified. Adrenals/Urinary Tract: A 9 mm RIGHT UPJ calculus causes moderate to severe RIGHT hydronephrosis. An 8 mm LEFT UPJ calculus causes moderate to severe LEFT hydronephrosis. Multiple nonobstructing bilateral renal calculi are again identified. A 5 mm calculus is noted within the dependent portion of the LEFT renal pelvis. The adrenal glands and bladder are within normal limits. Stomach/Bowel: Stomach is within normal limits. Appendix appears normal. No evidence of bowel wall thickening, distention, or inflammatory changes. Vascular/Lymphatic: Aortic atherosclerosis. No enlarged abdominal or pelvic lymph nodes. Reproductive: Prostate is unchanged without definite abnormality. Other: No ascites, focal collection or pneumoperitoneum. Musculoskeletal: No acute or suspicious bony abnormalities are noted. Lumbar spine degenerative changes again noted. IMPRESSION: 1. Bilateral UPJ calculi (9 mm on the RIGHT and 8 mm on the LEFT) causing moderate to severe bilateral hydronephrosis. 2. Nonobstructing bilateral renal calculi. A 5 mm calculus is present within the LEFT renal pelvis. 3. Equivocal mild contour irregularity of the liver which may represent cirrhosis. 4.  Splenomegaly. 5. Aortic Atherosclerosis (ICD10-I70.0). Electronically Signed   By: Cleatis Polka.D.  On: 08/15/2022 09:29    Impression/Assessment:  1.  Bilateral proximal ureteral calculi with high-grade obstruction Plan:  Cystoscopy, insertion of bilateral JJ stents, will likely need cystoscopy bilateral ureteroscopy and laser lithotripsy once medically stable in 2 to 3 weeks.  Risk and benefits procedure regarding stent placement were discussed in detail with the patient.  Remi Haggard 08/15/2022, 11:22 AM  Jamal Collin Ibeth Fahmy,MD

## 2022-08-15 NOTE — Consult Note (Signed)
Renal Service Consult Note [facility]  Jeffrey Ayala 08/15/2022 Jeffrey Blazing, Ayala Requesting Physician: Dr. Lorin Ayala  Reason for Consult: ESRD pt w/ abdominal pain HPI: The patient is a 61 y.o. year-old w/ hx of atrial fib, cirrhosis and ESRD on HD TTS. Pt had complicated prolonged admission in April-May w/ septic shock requiring CRRT then iHD. He was dc'd to CIR then home in early June. Has been getting outpt HD on TTS schedule in [place]. He presented early today w/ new onset abd pain and was found to have bilat hydronephrosis. Urology consulted and took pt to the OR for bilat proximal ureteral calculit w/ bilat ureteral obstruction. Bilat JJ stents were inserted. We are asked to see for dialysis.  Pt seen in room, appears alert in no distress, pleasant. No cough, SOB, leg swelling.    ROS - denies CP, no joint pain, no HA, no blurry vision, no rash, no diarrhea, no nausea/ vomiting   Past Medical History  Past Medical History:  Diagnosis Date   Atrial fibrillation, chronic ([name])    not on [facility]   Cardiac arrest ([place]) 02/2022   in the setting of septic shock   Cirrhosis of liver ([name])    Concussion    Eosinophilic esophagitis    EGD/bx by Dr. 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   Past Surgical History  Past Surgical History:  Procedure Laterality Date   AMPUTATION Right 03/15/2022   Procedure: RIGHT BELOW KNEE AMPUTATION;  Surgeon: Jeffrey Minion, Ayala;  Location: [place];  Service: Orthopedics;  Laterality: Right;   CYSTOSCOPY     DENTAL SURGERY     FINGER SURGERY     HERNIA REPAIR     at age 47   IR FLUORO GUIDE CV LINE RIGHT  03/09/2022   IR PATIENT EVAL TECH 0-60 MINS  03/09/2022   IR US GUIDE VASC ACCESS RIGHT   03/09/2022   KNEE ARTHROSCOPY Left    LITHOTRIPSY     NEPHROLITHOTOMY     SCROTAL EXPLORATION N/A 03/16/2022   Procedure: IRRIGATION AND DEBRIDEMENT SCROTUM;  Surgeon: Jeffrey Bring, Ayala;  Location: [place];  Service: Urology;  Laterality: N/A;   SCROTAL EXPLORATION N/A 03/28/2022   Procedure: IRRIGATION AND DEBRIDEMENT SCROTUM;  Surgeon: Jeffrey Hughs, Ayala;  Location: [place];  Service: Urology;  Laterality: N/A;   TONSILLECTOMY     TOTAL KNEE ARTHROPLASTY Bilateral 11/04/2018   Procedure: BILATERAL TOTAL KNEE ARTHROPLASTY;  Surgeon: Jeffrey Minion, Ayala;  Location: [place];  Service: Orthopedics;  Laterality: Bilateral;  spinal/epidural per anesthesiologist   TRANSMETATARSAL AMPUTATION Left 03/15/2022   Procedure: LEFT TRANSMETATARSAL AMPUTATION;  Surgeon: Jeffrey Minion, Ayala;  Location: [place];  Service: Orthopedics;  Laterality: Left;   Family History  Family History  Problem Relation Age of Onset   Arrhythmia Mother    COPD Mother    Sarcoidosis Brother    Cancer Brother    Stomach cancer Neg Hx    Pancreatic cancer Neg Hx    Esophageal cancer Neg Hx    Colon cancer Neg Hx    Rectal cancer Neg Hx    Social History  reports that he  has never smoked. He has never used smokeless tobacco. He reports that he does not currently use alcohol. He reports that he does not use drugs. Allergies  Allergies  Allergen Reactions   Codeine Hives    Insomnia, jittery   Flomax [Tamsulosin Hcl] Nausea Only    Nausea and fatigue   Home medications Prior to Admission medications   Medication Sig Start Date End Date Taking? Authorizing Provider  acetaminophen (TYLENOL) 650 MG CR tablet Take 650 mg by mouth every 8 (eight) hours as needed for pain or fever.   Yes Provider, Historical, Ayala  allopurinol (ZYLOPRIM) 100 MG tablet Take 1 tablet (100 mg total) by mouth daily. 05/27/22  Yes Jeffrey Ayala  pantoprazole (PROTONIX) 40 MG tablet Take 1 tablet (40 mg total) by mouth 2 (two) times  daily. Patient taking differently: Take 40 mg by mouth daily. 05/27/22  Yes Jeffrey Ayala  traZODone (DESYREL) 50 MG tablet TAKE 1/2 OR 1 TABLET BY MOUTH AT BEDTIME AS NEEDED FOR SLEEP Patient taking differently: Take 25-50 mg by mouth at bedtime as needed for sleep. 07/10/22  Yes Jeffrey Ayala  multivitamin (RENA-VIT) TABS tablet Take 1 tablet by mouth at bedtime. Patient not taking: Reported on 08/15/2022 04/14/22   Jeffrey Ayala  PEG-KCl-NaCl-NaSulf-Na Asc-C (PLENVU) 140 g SOLR Use as directed 08/12/22   Jeffrey Ayala     Vitals:   08/15/22 1300 08/15/22 1315 08/15/22 1338 08/15/22 1611  BP: 109/81 114/71 115/69 (!) 102/55  Pulse: 94 98 90 97  Resp: 14 (!) '22 18 18  '$ Temp:  97.6 F (36.4 C) 97.9 F (36.6 C) 98.4 F (36.9 C)  TempSrc:   Oral Oral  SpO2: 98% 98% 99% 98%  Weight:      Height:       Exam Gen alert, no distress No rash, cyanosis or gangrene Sclera anicteric, throat clear  No jvd or bruits Chest clear bilat to bases, no rales/ wheezing RRR no MRG Abd soft ntnd no mass or ascites +bs GU normal male MS no joint effusions or deformity Ext L TMA, R BKA Neuro is alert, Ox 3 , nf    RIJ TDC intact   Home meds include - allopurinol, pantoprazole, trazodone, renavite,    OP HD: TTS [place] 4h  400/800   91kg  3K/2.5Ca bath RIJ TDC   Hep 4000 - last HD 9/21, post wt 93.8kg - mircera 225 ug q2, last 9/14, due 9/28 - venofer 100 tiw throu 10/05    Na 138  K 3.4  CO2 23  BUN 21  creat 3.64  Hb 8.9     Assessment/ Plan: Obstructive uropathy - acute abd pain, found to have bilat UPJ calculi causing moderate to severe bilateral hydronephrosis. Taken to OR today and new JJ stents were placed bilaterally per urology.  ESRD - on HD TTS. Started HD in April while admitted for severe septic shock. Is now getting OP HD TTS in [place] via [facility]. Good compliance. Plan HD this evening.  BP/ vol - BP's wnl, not high. No vol excess by exam.  Small UF w/ HD tonight.  Anemia esrd - Hb 8.9, last esa was 914, due 9/28 if still here.  MBD ckd - Ca in range, will add on phos. Cont mvi, any binders if taking.  H/o cirrhosis Chronic afib      Kelly Splinter  Ayala 08/15/2022, 5:05 PM Recent Labs  Lab 08/15/22 0720  HGB  8.9*  ALBUMIN 3.5  CALCIUM 9.2  CREATININE 3.64*  K 3.4*   Inpatient medications:  [START ON 08/16/2022] allopurinol  100 mg Oral Daily   heparin  5,000 Units Subcutaneous Q8H   multivitamin  1 tablet Oral QHS   [START ON 08/16/2022] pantoprazole  40 mg Oral Daily    sodium chloride 10 mL/hr at 08/15/22 1201   acetaminophen **OR** acetaminophen, calcium carbonate (dosed in mg elemental calcium), camphor-menthol **AND** hydrOXYzine, docusate sodium, feeding supplement (NEPRO CARB STEADY), hydrALAZINE, ondansetron **OR** ondansetron (ZOFRAN) IV, sorbitol, traZODone

## 2022-08-15 NOTE — Anesthesia Postprocedure Evaluation (Signed)
Anesthesia Post Note  Patient: KHADAR MONGER  Procedure(s) Performed: CYSTOSCOPY WITH STENT PLACEMENT (Bilateral: Ureter)     Patient location during evaluation: PACU Anesthesia Type: General Level of consciousness: awake and alert Pain management: pain level controlled Vital Signs Assessment: post-procedure vital signs reviewed and stable Respiratory status: spontaneous breathing, nonlabored ventilation, respiratory function stable and patient connected to nasal cannula oxygen Cardiovascular status: blood pressure returned to baseline and stable Postop Assessment: no apparent nausea or vomiting Anesthetic complications: no   No notable events documented.  Last Vitals:  Vitals:   08/15/22 1338 08/15/22 1611  BP: 115/69 (!) 102/55  Pulse: 90 97  Resp: 18 18  Temp: 36.6 C 36.9 C  SpO2: 99% 98%    Last Pain:  Vitals:   08/15/22 1611  TempSrc: Oral  PainSc:                  March Rummage Kherington Meraz

## 2022-08-15 NOTE — Op Note (Signed)
Preoperative diagnosis:  1.  Bilateral proximal ureteral calculi with ureteral obstruction  Postoperative diagnosis: 1.  Same  Procedure(s): 1.  Cystoscopy bilateral retrograde pyelogram with intraoperative interpretation, bilateral JJ stent insertion  Surgeon: Dr. Harold Barban  Anesthesia: General  Complications: None  EBL: Minimal  Specimens: None  Disposition of specimens: Not applicable  Intraoperative findings: 9 mm right proximal ureteral calculus and 8 mm left proximal ureteral calculus.  Able to pass a 6 [place] by 26 cm Percuflex plus soft contour stents by on both sides without difficulty.  No tether  Indication: 61 year old white male with chronic renal insufficiency.  Presents with abdominal pain and bilateral obstructing proximal ureteral calculi.  Presents at this time undergo cystoscopy insertion of bilateral JJ stents  Description of procedure:  After obtaining informed consent the patient was taken major or suite placed under general anesthesia.  Placed in the dorsolithotomy position genitalia prepped and draped usual sterile fashion.  Proper pause and timeout performed.  37 French cystoscope was advanced in the bladder without difficulty.  Patient had some hypertrophied rugal folds and mildly elevated median lobe hypertrophy.  Both ureteral orifice ease were normal position.  I 5 French open tip catheter was pit placed to the working channel of the cystoscope and utilized to cannulate the right ureteral orifice.  A sensor wire was then passed through this up to the level of the stone.  The open tip catheter was advanced up to the stone and the wire was then passed alongside the stone into the renal pelvis.  The open tip catheter was manipulated over the wire to the renal pelvis.  Retrograde pyelogram confirmed filling defect in the proximal ureter and outlined hydronephrotic renal pelvis.  Guidewire was placed up tip catheter removed.  6 [place] by 26 cm Percuflex plus  soft contour stent was placed leaving a proximal coil in renal pelvis and distal coil in the bladder.  There was good flow of urine through and around the stent noted.  Attention was then directed towards the left side.  In similar manner this was cannulated through the left ureteral orifice with a 5 French open tip catheter and over guidewire manipulated up to the level of the stone.  The stone was more obstructive in minimal contrast originally went by the stone.  I was able to manipulate the sensor wire past the stone to the renal pelvis and then the open tip catheter passed the stone into the renal pelvis.  Retrograde pyelogram outlined the stone in the renal pelvis at this point.  Wire was passed at the open tip catheter the operative catheter removed.  6 [place] by 26 cm Percuflex plus soft contour stent placed on the left side again with coil in the renal pelvis and distal coil in the bladder there is hydronephrotic flow of clear urine through the stent.  The bladder was emptied procedure terminated.  He is awake from anesthesia and taken back to recovery in stable condition.  No immediate complication from the procedure.  Patient will likely need outpatient cystoscopy and bilateral ureteroscopy and laser lithotripsy in 2 to 3 weeks.

## 2022-08-15 NOTE — H&P (Signed)
History and Physical    Patient: Jeffrey Ayala UVO:[id number] DOB: 1961-06-08 [name]: 08/15/2022 DOS: the patient was seen and examined on 08/15/2022 PCP: [name], NP-C  Patient coming from: Home - lives with wife; NOK: Wife, Orlando Devereux, [phone]   Chief Complaint: Abdominal pain  HPI: Jeffrey Ayala is a 61 y.o. male with medical history significant of afib; cirrhosis; and ESRD on TTS HD presenting with abdominal pain.   He had a complicated and prolonged hospitalization from 4/2-5/12 with septic shock and cardiac arrest.  The septic shock was thought to be due to scrotal infection and he had multiple urological surgical interventions; cultures grew Pseudomonas, E faecalis, and Bacteroides.  Because of the septic shock, his renal function worsened and he was started on CRRT -> HD.  He developed vasoplegia that was treated with methylene blue infusion but he ultimately L TMA and R BKA.  He was discharged to [facility] and was discharged to home on 5/24 but returned for readmission from 5/27-29 for afib with RVR (afib was diagnosed during prior hospitalization).  He was started on Eliquis and metoprolol but is not currently taking any AC including ASA due to persistent anemia (per cardiology).  He has not had further scrotal issues.  He has a long-standing h/o nephrolithiasis including staghorn calculi.  He had a great day yesterday but developed acute abdominal pain overnight.  He had a small BM but that did not help.  Eventually he decided to come to the ER.    ER Course:  B nephrolithiasis.  Dr. Milford Cage is planning to stent on both sides.  Due for HD today, nephrology paged.     Review of Systems: As mentioned in the history of present illness. All other systems reviewed and are negative. Past Medical History:  Diagnosis Date   Atrial fibrillation, chronic ([name])    not on [facility]   Cardiac arrest ([place]) 02/2022   in the setting of septic shock   Cirrhosis of liver ([name])    Concussion     Eosinophilic esophagitis    EGD/bx by Dr. 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   Past Surgical History:  Procedure Laterality Date   AMPUTATION Right 03/15/2022   Procedure: RIGHT BELOW KNEE AMPUTATION;  Surgeon: [name], MD;  Location: [place];  Service: Orthopedics;  Laterality: Right;   CYSTOSCOPY     DENTAL SURGERY     FINGER SURGERY     HERNIA REPAIR     at age 69   IR FLUORO GUIDE CV LINE RIGHT  03/09/2022   IR PATIENT EVAL TECH 0-60 MINS  03/09/2022   IR US GUIDE VASC ACCESS RIGHT  03/09/2022   KNEE ARTHROSCOPY Left    LITHOTRIPSY     NEPHROLITHOTOMY     SCROTAL EXPLORATION N/A 03/16/2022   Procedure: IRRIGATION AND DEBRIDEMENT SCROTUM;  Surgeon: Raynelle Bring, MD;  Location: [place];  Service: Urology;  Laterality: N/A;   SCROTAL EXPLORATION N/A 03/28/2022   Procedure: IRRIGATION AND DEBRIDEMENT SCROTUM;  Surgeon: Ardis Hughs, MD;  Location: [place];  Service: Urology;  Laterality: N/A;   TONSILLECTOMY     TOTAL KNEE ARTHROPLASTY Bilateral 11/04/2018  Procedure: BILATERAL TOTAL KNEE ARTHROPLASTY;  Surgeon: [name], MD;  Location: [place];  Service: Orthopedics;  Laterality: Bilateral;  spinal/epidural per anesthesiologist   TRANSMETATARSAL AMPUTATION Left 03/15/2022   Procedure: LEFT TRANSMETATARSAL AMPUTATION;  Surgeon: [name], MD;  Location: [place];  Service: Orthopedics;  Laterality: Left;   Social History:  reports that he has never smoked. He has never used smokeless tobacco. He reports that he does not currently use alcohol. He reports that he does not use drugs.  Allergies  Allergen Reactions   Codeine Hives    Insomnia, jittery   Flomax [Tamsulosin Hcl] Nausea Only    Nausea and fatigue    Family History   Problem Relation Age of Onset   Arrhythmia Mother    COPD Mother    Sarcoidosis Brother    Cancer Brother    Stomach cancer Neg Hx    Pancreatic cancer Neg Hx    Esophageal cancer Neg Hx    Colon cancer Neg Hx    Rectal cancer Neg Hx     Prior to Admission medications   Medication Sig Start Date End Date Taking? Authorizing Provider  acetaminophen (TYLENOL) 650 MG CR tablet Take 650 mg by mouth every 8 (eight) hours as needed for pain or fever.    [provider]  allopurinol (ZYLOPRIM) 100 MG tablet Take 1 tablet (100 mg total) by mouth daily. 05/27/22   Henson, Vickie L, NP-C  escitalopram (LEXAPRO) 10 MG tablet TAKE 1 TABLET BY MOUTH EVERY DAY 07/03/22   Henson, Vickie L, NP-C  multivitamin (RENA-VIT) TABS tablet Take 1 tablet by mouth at bedtime. 04/14/22   Angiulli, Lavon Paganini, PA-C  pantoprazole (PROTONIX) 40 MG tablet Take 1 tablet (40 mg total) by mouth 2 (two) times daily. 05/27/22   Zehr, Laban Emperor, PA-C  PEG-KCl-NaCl-NaSulf-Na Asc-C (PLENVU) 140 g SOLR Use as directed 08/12/22   Yetta Flock, MD  traZODone (DESYREL) 50 MG tablet TAKE 1/2 OR 1 TABLET BY MOUTH AT BEDTIME AS NEEDED FOR SLEEP 07/10/22   Meredith Staggers, MD  warfarin (COUMADIN) 5 MG tablet TAKE 1 TABLET (5 MG TOTAL) BY MOUTH DAILY. 07/03/22   Geralynn Rile, MD    Physical Exam: Vitals:   08/15/22 1300 08/15/22 1315 08/15/22 1338 08/15/22 1611  BP: 109/81 114/71 115/69 (!) 102/55  Pulse: 94 98 90 97  Resp: 14 (!) '22 18 18  '$ Temp:  97.6 F (36.4 C) 97.9 F (36.6 C) 98.4 F (36.9 C)  TempSrc:   Oral Oral  SpO2: 98% 98% 99% 98%  Weight:      Height:       General:  Appears calm and comfortable and is in NAD Eyes:   EOMI, normal lids, iris ENT:  grossly normal hearing, lips & tongue, mmm; some absent dentition Neck:  no LAD, masses or thyromegaly Cardiovascular:  RRR, no m/r/g. No LE edema.  Respiratory:   CTA bilaterally with no wheezes/rales/rhonchi.  Normal respiratory  effort. Abdomen:  soft, NT, ND Back:   normal alignment, no CVAT Skin:  no rash or induration seen on limited exam Musculoskeletal:  grossly normal tone BUE/BLE, good ROM, no bony abnormality; s/p R BKA (stump C/D/I) and L TMA (wound healing well) Psychiatric:  blunted and occasionally labile mood and affect, speech fluent and appropriate, AOx3 Neurologic:  CN 2-12 grossly intact, moves all extremities in coordinated fashion   Radiological Exams on Admission: Independently reviewed - see discussion in A/P where applicable  DG  Retrograde Pyelogram  Result Date: 08/15/2022 CLINICAL DATA:  Obstructing bilateral UPJ stones EXAM: RETROGRADE PYELOGRAM COMPARISON:  CT abdomen/pelvis from 08/15/2022. FLUOROSCOPY TIME:  Radiation Exposure Index (if provided by the fluoroscopic device): 9.5 mGy FINDINGS: Multiple spot fluoroscopic retrograde pyelogram images demonstrate stone filling defect at the right UPJ with subsequent placement of bilateral nephroureteral stents with proximal pigtail portions overlying the central renal collecting systems bilaterally and distal pigtail portions overlying the bladder bilaterally. IMPRESSION: Intraoperative fluoroscopic guidance for retrograde pyelogram with bilateral nephroureteral stent placement. Electronically Signed   By: Ilona Sorrel M.D.   On: 08/15/2022 13:14   CT Abdomen Pelvis Wo Contrast  Result Date: 08/15/2022 CLINICAL DATA:  61 year old male with acute abdominal and pelvic pain. EXAM: CT ABDOMEN AND PELVIS WITHOUT CONTRAST TECHNIQUE: Multidetector CT imaging of the abdomen and pelvis was performed following the standard protocol without IV contrast. RADIATION DOSE REDUCTION: This exam was performed according to the departmental dose-optimization program which includes automated exposure control, adjustment of the mA and/or kV according to patient size and/or use of iterative reconstruction technique. COMPARISON:  03/31/2022 and prior CTs FINDINGS: Please  note that parenchymal and vascular abnormalities may be missed as intravenous contrast was not administered. Lower chest: No acute abnormality. Hepatobiliary: Equivocal mild contour irregularity of the liver is noted. No other hepatic abnormalities are identified. The gallbladder is unremarkable. There is no evidence of intrahepatic or extrahepatic biliary dilatation. Pancreas: Unremarkable Spleen: Splenomegaly again identified. Adrenals/Urinary Tract: A 9 mm RIGHT UPJ calculus causes moderate to severe RIGHT hydronephrosis. An 8 mm LEFT UPJ calculus causes moderate to severe LEFT hydronephrosis. Multiple nonobstructing bilateral renal calculi are again identified. A 5 mm calculus is noted within the dependent portion of the LEFT renal pelvis. The adrenal glands and bladder are within normal limits. Stomach/Bowel: Stomach is within normal limits. Appendix appears normal. No evidence of bowel wall thickening, distention, or inflammatory changes. Vascular/Lymphatic: Aortic atherosclerosis. No enlarged abdominal or pelvic lymph nodes. Reproductive: Prostate is unchanged without definite abnormality. Other: No ascites, focal collection or pneumoperitoneum. Musculoskeletal: No acute or suspicious bony abnormalities are noted. Lumbar spine degenerative changes again noted. IMPRESSION: 1. Bilateral UPJ calculi (9 mm on the RIGHT and 8 mm on the LEFT) causing moderate to severe bilateral hydronephrosis. 2. Nonobstructing bilateral renal calculi. A 5 mm calculus is present within the LEFT renal pelvis. 3. Equivocal mild contour irregularity of the liver which may represent cirrhosis. 4. Splenomegaly. 5. Aortic Atherosclerosis (ICD10-I70.0). Electronically Signed   By: Margarette Canada M.D.   On: 08/15/2022 09:29    EKG: pending   Labs on Admission: I have personally reviewed the available labs and imaging studies at the time of the admission.  Pertinent labs:    K+ 3.4 Glucose 136 Creatinine 3.64/GFR 18 WBC 8.4 Hgb  8.9 UA WNL   Assessment and Plan: Principal Problem:   Acute bilateral obstructive uropathy Active Problems:   Essential hypertension   Cirrhosis ([place])   Kidney stones   Atrial fibrillation ([name])   Dependence on renal dialysis ([place])    Obstructive uropathy -Patient with complicated recent hospitalization but unrelated current presentation here with abdominal pain -Imaging showed B UPJ calculi causing moderate to severe hydronephrosis -As a result, he was taken quickly to the OR for B stent placement -UA does not currently appear infected so will hold abx and defer to urology -Will admit to Med Surg for now -He will need more definitive urologic procedure in a few weeks (so should not be started on  AC in the interim)  ESRD -Patient on chronic TTS HD -Nephrology prn order set utilized -His renal function is slowly recovering and they are optimistic that he will eventually be able to stop HD -The upcoming plan is to decrease the frequency to twice weekly -Nephrology is aware that patient will need HD -He is scheduled for fistula placement on 10/6  Chronic afib -He was seen by Dr. Audie Box on 8/11 and is due to f/u next month -He was okayed to continue metoprolol despite marginal BPs -He has had recurrent anemia without definitive source of bleeding and so is not on [facility] and is not safe for TEE/DDCV as a result -He was sent to heme/onc and GI for f/u -Dr. Alen Blew recommended continuing erythropoietin while ruling out GI bleeding; anemia was thought most likely to be related to myelosuppression from acute hospitalization and infection and recovery is likely to happen over time -His Hgb is currently stable -Consider addition of ASA - either now or after EGD/colonoscopy  Vasoplegia -As a result of septic shock -Treated with methylene blue -s/p L TMA and R BKA -Healing well and starting to walk with prosthesis -PT/OT/TOC team consults  Cirrhosis -Also with h/o eosinophilic  esophagitis -Followed by Dr. Havery Moros, last seen on 9/20 -Compensated, undergoing evaluation and scheduled for EGD/colonoscopy next month    Advance Care Planning:   Code Status: Full Code   Consults: Urology; nephrology; PT/OT; [facility] team  DVT Prophylaxis: Heparin  Family Communication: Wife was present throughout evaluation  Severity of Illness: The appropriate patient status for this patient is INPATIENT. Inpatient status is judged to be reasonable and necessary in order to provide the required intensity of service to ensure the patient's safety. The patient's presenting symptoms, physical exam findings, and initial radiographic and laboratory data in the context of their chronic comorbidities is felt to place them at high risk for further clinical deterioration. Furthermore, it is not anticipated that the patient will be medically stable for discharge from the hospital within 2 midnights of admission.   * I certify that at the point of admission it is my clinical judgment that the patient will require inpatient hospital care spanning beyond 2 midnights from the point of admission due to high intensity of service, high risk for further deterioration and high frequency of surveillance required.*  Author: Karmen Bongo, MD 08/15/2022 4:45 PM  For on call review [email or web address].

## 2022-08-15 NOTE — ED Triage Notes (Signed)
Pt. Stated, I woke up around 0300 with terrible stomach pain. I peed and tried to have a bowel Movement, nothing helped, it still kept hurting. I have some Nausea when I was trying to get into the car.

## 2022-08-15 NOTE — Evaluation (Signed)
Physical Therapy Evaluation Patient Details Name: Jeffrey Ayala MRN: [id number] DOB: 08/02/1961 Today's Date: 08/15/2022  History of Present Illness  61 y/o male presented to ED on 08/15/22 for stomach pain. CT confirms bilateral proximal ureteral calculi with obstruction. S/p cystocopy with insertion of bilateral JJ stents on 9/23. PMH: hx of Fournier's gangrene, Afib, hx of R BKA, chronic renal insufficiency on HD.  Clinical Impression  Patient admitted with the above. PTA, patient lives with wife and was modI for transfers from w/c. Plan to get new prosthetic next week and is already active with OPPT. Patient able to demonstrate transfer to/from [facility] via squat pivot with min guard. Would do better with more familiar home setup and RW. Patient currently at baseline for mobility and has OPPT appointments set up for near future. No further skilled PT needs identified acutely. Will defer to OPPT once discharged.        Recommendations for follow up therapy are one component of a multi-disciplinary discharge planning process, led by the attending physician.  Recommendations may be updated based on patient status, additional functional criteria and insurance authorization.  Follow Up Recommendations Outpatient PT (continue OPPT)      Assistance Recommended at Discharge PRN  Patient can return home with the following       Equipment Recommendations None recommended by PT  Recommendations for Other Services       Functional Status Assessment Patient has not had a recent decline in their functional status     Precautions / Restrictions Precautions Precautions: Fall Restrictions Weight Bearing Restrictions: No      Mobility  Bed Mobility Overal bed mobility: Modified Independent             General bed mobility comments: HOB elevated, use of rail to assist    Transfers Overall transfer level: Needs assistance Equipment used: None Transfers: Bed to chair/wheelchair/BSC        Squat pivot transfers: Min guard     General transfer comment: from bed to [facility], close guard - Pt might do better with RW and coming to stand    Ambulation/Gait                  Stairs            Wheelchair Mobility    Modified Rankin (Stroke Patients Only)       Balance Overall balance assessment: No apparent balance deficits (not formally assessed)                                           Pertinent Vitals/Pain Pain Assessment Pain Assessment: Faces Faces Pain Scale: No hurt Pain Intervention(s): Monitored during session    Home Living Family/patient expects to be discharged to:: Private residence Living Arrangements: Spouse/significant other Available Help at Discharge: Family;Available 24 hours/day Type of Home: House Home Access: Ramped entrance       Home Layout: Two level;Able to live on main level with bedroom/bathroom Home Equipment: Rolling Dahir Ayer (2 wheels);BSC/3in1;Tub bench;Hand held shower head;Wheelchair - Engineer, technical sales - power;Other (comment) (sliding board)      Prior Function Prior Level of Function : Working/employed;Needs assist       Physical Assist : ADLs (physical)   ADLs (physical): Bathing;Dressing;IADLs Mobility Comments: WC, been working with OPPT and prosthetic with RW ADLs Comments: wife assists with bed bath, and initial dressing. He has been performing toilet  transfers and clothing/peri care mod I from [facility] level     Hand Dominance   Dominant Hand: Right    Extremity/Trunk Assessment   Upper Extremity Assessment Upper Extremity Assessment: Defer to OT evaluation    Lower Extremity Assessment Lower Extremity Assessment: Generalized weakness (hx of R BKA and L TMA)    Cervical / Trunk Assessment Cervical / Trunk Assessment: Normal  Communication   Communication: No difficulties  Cognition Arousal/Alertness: Awake/alert Behavior During Therapy: WFL for tasks assessed/performed, Flat  affect Overall Cognitive Status: Within Functional Limits for tasks assessed                                          General Comments General comments (skin integrity, edema, etc.): wife present and very supportive, has a goal of getting mobile enough to get back in his garage and work on cars    Exercises     Assessment/Plan    PT Assessment All further PT needs can be met in the next venue of care  PT Problem List Decreased strength;Decreased activity tolerance;Decreased balance;Decreased mobility       PT Treatment Interventions      PT Goals (Current goals can be found in the Care Plan section)  Acute Rehab PT Goals Patient Stated Goal: to go home and get new prosthetic next week PT Goal Formulation: All assessment and education complete, DC therapy    Frequency       Co-evaluation               AM-PAC PT "6 Clicks" Mobility  Outcome Measure Help needed turning from your back to your side while in a flat bed without using bedrails?: None Help needed moving from lying on your back to sitting on the side of a flat bed without using bedrails?: None Help needed moving to and from a bed to a chair (including a wheelchair)?: A Little Help needed standing up from a chair using your arms (e.g., wheelchair or bedside chair)?: A Little Help needed to walk in hospital room?: A Little Help needed climbing 3-5 steps with a railing? : A Little 6 Click Score: 20    End of Session   Activity Tolerance: Patient tolerated treatment well Patient left: in bed;with call bell/phone within reach;with family/visitor present Nurse Communication: Mobility status PT Visit Diagnosis: Muscle weakness (generalized) (M62.81)    Time: 3329-5188 PT Time Calculation (min) (ACUTE ONLY): 28 min   Charges:   PT Evaluation $PT Eval Low Complexity: 1 Low          Sundee Garland A. Dan Humphreys PT, DPT Acute Rehabilitation Services Office [phone]   Viviann Spare 08/15/2022, 4:35 PM

## 2022-08-15 NOTE — Transfer of Care (Signed)
Immediate Anesthesia Transfer of Care Note  Patient: CHIBUIKEM THANG  Procedure(s) Performed: CYSTOSCOPY WITH STENT PLACEMENT (Bilateral: Ureter)  Patient Location: PACU  Anesthesia Type:General  Level of Consciousness: awake, alert , patient cooperative and responds to stimulation  Airway & Oxygen Therapy: Patient Spontanous Breathing and Patient connected to nasal cannula oxygen  Post-op Assessment: Report given to RN and Post -op Vital signs reviewed and stable  Post vital signs: Reviewed and stable  Last Vitals:  Vitals Value Taken Time  BP    Temp    Pulse 89 08/15/22 1244  Resp 14 08/15/22 1244  SpO2 97 % 08/15/22 1244  Vitals shown include unvalidated device data.  Last Pain:  Vitals:   08/15/22 1155  TempSrc:   PainSc: 2          Complications: No notable events documented.

## 2022-08-16 ENCOUNTER — Encounter (HOSPITAL_COMMUNITY): Payer: Self-pay | Admitting: Urology

## 2022-08-16 DIAGNOSIS — N139 Obstructive and reflux uropathy, unspecified: Secondary | ICD-10-CM | POA: Diagnosis not present

## 2022-08-16 LAB — CBC
HCT: 21.8 % — ABNORMAL LOW (ref 39.0–52.0)
Hemoglobin: 7.6 g/dL — ABNORMAL LOW (ref 13.0–17.0)
MCH: 34.1 pg — ABNORMAL HIGH (ref 26.0–34.0)
MCHC: 34.9 g/dL (ref 30.0–36.0)
MCV: 97.8 fL (ref 80.0–100.0)
Platelets: 165 10*3/uL (ref 150–400)
RBC: 2.23 MIL/uL — ABNORMAL LOW (ref 4.22–5.81)
RDW: 19.3 % — ABNORMAL HIGH (ref 11.5–15.5)
WBC: 8.9 10*3/uL (ref 4.0–10.5)
nRBC: 0 % (ref 0.0–0.2)

## 2022-08-16 LAB — BASIC METABOLIC PANEL
Anion gap: 12 (ref 5–15)
BUN: 13 mg/dL (ref 8–23)
CO2: 24 mmol/L (ref 22–32)
Calcium: 8.5 mg/dL — ABNORMAL LOW (ref 8.9–10.3)
Chloride: 101 mmol/L (ref 98–111)
Creatinine, Ser: 2.32 mg/dL — ABNORMAL HIGH (ref 0.61–1.24)
GFR, Estimated: 31 mL/min — ABNORMAL LOW (ref >60)
Glucose, Bld: 139 mg/dL — ABNORMAL HIGH (ref 70–99)
Potassium: 4 mmol/L (ref 3.5–5.1)
Sodium: 137 mmol/L (ref 135–145)

## 2022-08-16 LAB — GLUCOSE, CAPILLARY: Glucose-Capillary: 98 mg/dL (ref 70–99)

## 2022-08-16 MED ORDER — GABAPENTIN 100 MG PO CAPS
100.0000 mg | ORAL_CAPSULE | Freq: Once | ORAL | Status: AC
  Administered 2022-08-16: 100 mg via ORAL
  Filled 2022-08-16: qty 1

## 2022-08-16 MED ORDER — TOLTERODINE TARTRATE ER 4 MG PO CP24: 4.0000 mg | ORAL_CAPSULE | Freq: Every day | ORAL | 0 refills | Status: DC

## 2022-08-16 NOTE — Progress Notes (Signed)
   08/15/22 2350  Vitals  Temp 98.7 F (37.1 C)  Temp Source Oral  BP (!) 114/58  MAP (mmHg) 75  BP Location Left Arm  BP Method Automatic  Patient Position (if appropriate) Lying  Pulse Rate (!) 107  Pulse Rate Source Monitor  ECG Heart Rate (!) 105  Resp 20  Post Treatment  Dialyzer Clearance Heavily streaked  Duration of HD Treatment -hour(s) 3 hour(s)  Liters Processed 67.5  Fluid Removed 0 mL  Post-Hemodialysis Comments all fluids taken given back due burning pain in chest, relieved by fluids back and reduction in blood speed.   TX fin.  W Bonnye Fava of burning pain w / fluids back and reduction of QB.

## 2022-08-16 NOTE — Progress Notes (Signed)
       REMOTE CROSS COVER NOTE  NAME: Jeffrey Ayala MRN: [id number] DOB : [birth year]    Date of Service   08/16/2022   HPI/Events of Note   Medication request received for home gabapentin for phantom limb pain.  Interventions   Assessment/Plan: Gabapentin 100 mg PO x1    This document was prepared using Dragon voice recognition software and may include unintentional dictation errors.  Neomia Glass DNP, MHA, FNP-BC Nurse Practitioner [facility] Pager [phone]

## 2022-08-16 NOTE — TOC Transition Note (Signed)
Transition of Care [facility]) - CM/SW Discharge Note   Patient Details  Name: Jeffrey Ayala MRN: [id number] Date of Birth: [birth year]  [facility] CM/SW Contact:  Bartholomew Crews, RN Phone Number: [phone] 08/16/2022, 9:40 AM   Clinical Narrative:     Patient to transition home today. Spoke with his wife [name] on hospital room phone. Discussed recommendations for outpatient PT. [name] advised that patient is already sent with outpatient PT. No further TOC needs identified at this time.   Final next level of care: OP Rehab Barriers to Discharge: No Barriers Identified   Patient Goals and CMS Choice        Discharge Placement                       Discharge Plan and Services                                     Social Determinants of Health (SDOH) Interventions     Readmission Risk Interventions    04/03/2022    1:07 PM  Readmission Risk Prevention Plan  Transportation Screening Complete  Medication Review (RN Care Manager) Complete  HRI or [place] Complete  SW Recovery Care/Counseling Consult Complete  Palliative Care Screening Complete  [place] Not Applicable

## 2022-08-16 NOTE — Progress Notes (Signed)
1 Day Post-Op Subjective: Feeling better status post cystoscopy insertion of bilateral JJ stents.  Was dialyzed yesterday.  Continues to have some pain in the right flank when he urinates consistent with probable reflux around the stent.  Otherwise feeling well.  Objective: Vital signs in last 24 hours: Temp:  [97.6 F (36.4 C)-98.7 F (37.1 C)] 98 F (36.7 C) (09/24 0846) Pulse Rate:  [88-107] 104 (09/24 0846) Resp:  [10-22] 18 (09/24 0846) BP: (91-126)/(55-81) 104/60 (09/24 0846) SpO2:  [95 %-100 %] 95 % (09/24 0846) Weight:  [96.8 kg] 96.8 kg (09/23 2350)  Intake/Output from previous day: 09/23 0701 - 09/24 0700 In: 500 [I.V.:500] Out: 350 [Urine:350] Intake/Output this shift: Total I/O In: 240 [P.O.:240] Out: 400 [Urine:400]  Physical Exam:  General: Alert and oriented  Lab Results: Recent Labs    08/15/22 0720 08/16/22 0127  HGB 8.9* 7.6*  HCT 26.6* 21.8*   BMET Recent Labs    08/15/22 0720 08/16/22 0127  NA 138 137  K 3.4* 4.0  CL 102 101  CO2 23 24  GLUCOSE 136* 139*  BUN 21 13  CREATININE 3.64* 2.32*  CALCIUM 9.2 8.5*     Studies/Results: DG Retrograde Pyelogram  Result Date: 08/15/2022 CLINICAL DATA:  Obstructing bilateral UPJ stones EXAM: RETROGRADE PYELOGRAM COMPARISON:  CT abdomen/pelvis from 08/15/2022. FLUOROSCOPY TIME:  Radiation Exposure Index (if provided by the fluoroscopic device): 9.5 mGy FINDINGS: Multiple spot fluoroscopic retrograde pyelogram images demonstrate stone filling defect at the right UPJ with subsequent placement of bilateral nephroureteral stents with proximal pigtail portions overlying the central renal collecting systems bilaterally and distal pigtail portions overlying the bladder bilaterally. IMPRESSION: Intraoperative fluoroscopic guidance for retrograde pyelogram with bilateral nephroureteral stent placement. Electronically Signed   By: Ilona Sorrel M.D.   On: 08/15/2022 13:14   CT Abdomen Pelvis Wo Contrast  Result  Date: 08/15/2022 CLINICAL DATA:  61 year old male with acute abdominal and pelvic pain. EXAM: CT ABDOMEN AND PELVIS WITHOUT CONTRAST TECHNIQUE: Multidetector CT imaging of the abdomen and pelvis was performed following the standard protocol without IV contrast. RADIATION DOSE REDUCTION: This exam was performed according to the departmental dose-optimization program which includes automated exposure control, adjustment of the mA and/or kV according to patient size and/or use of iterative reconstruction technique. COMPARISON:  03/31/2022 and prior CTs FINDINGS: Please note that parenchymal and vascular abnormalities may be missed as intravenous contrast was not administered. Lower chest: No acute abnormality. Hepatobiliary: Equivocal mild contour irregularity of the liver is noted. No other hepatic abnormalities are identified. The gallbladder is unremarkable. There is no evidence of intrahepatic or extrahepatic biliary dilatation. Pancreas: Unremarkable Spleen: Splenomegaly again identified. Adrenals/Urinary Tract: A 9 mm RIGHT UPJ calculus causes moderate to severe RIGHT hydronephrosis. An 8 mm LEFT UPJ calculus causes moderate to severe LEFT hydronephrosis. Multiple nonobstructing bilateral renal calculi are again identified. A 5 mm calculus is noted within the dependent portion of the LEFT renal pelvis. The adrenal glands and bladder are within normal limits. Stomach/Bowel: Stomach is within normal limits. Appendix appears normal. No evidence of bowel wall thickening, distention, or inflammatory changes. Vascular/Lymphatic: Aortic atherosclerosis. No enlarged abdominal or pelvic lymph nodes. Reproductive: Prostate is unchanged without definite abnormality. Other: No ascites, focal collection or pneumoperitoneum. Musculoskeletal: No acute or suspicious bony abnormalities are noted. Lumbar spine degenerative changes again noted. IMPRESSION: 1. Bilateral UPJ calculi (9 mm on the RIGHT and 8 mm on the LEFT) causing  moderate to severe bilateral hydronephrosis. 2. Nonobstructing bilateral renal calculi. A 5  mm calculus is present within the LEFT renal pelvis. 3. Equivocal mild contour irregularity of the liver which may represent cirrhosis. 4. Splenomegaly. 5. Aortic Atherosclerosis (ICD10-I70.0). Electronically Signed   By: Margarette Canada M.D.   On: 08/15/2022 09:29    Assessment/Plan 1.  Bilateral ureteral calculi status post cystoscopy insertion of bilateral JJ stents  Plan/recommendation.  Okay to discharge home from GU standpoint.  We will have Dr. Alinda Money follow-up with patient regarding follow-up of his ureteral calculi.  We will send prescription for tolterodine to see if this will help with ureteral reflux/stent pain.    LOS: 1 day   Jeffrey Ayala 08/16/2022, 9:29 AM

## 2022-08-16 NOTE — Progress Notes (Signed)
[facility] Progress Note   Subjective:   Seen this AM, was concerned R stent was not draining well, urology already aware. Also reports he typically feels very tired and weak post HD, wonders if too much fluid is being removed. Denies SOB, orthopnea and edema.   Objective Vitals:   08/15/22 2350 08/16/22 0001 08/16/22 0447 08/16/22 0846  BP: (!) 114/58 116/64 118/62 104/60  Pulse: (!) 107 99 91 (!) 104  Resp: '20 18 17 18  '$ Temp: 98.7 F (37.1 C) 98.5 F (36.9 C) 97.9 F (36.6 C) 98 F (36.7 C)  TempSrc: Oral Oral Oral Oral  SpO2:  98% 99% 95%  Weight: 96.8 kg     Height:       Physical Exam General: Alert, well appearing male in NAD Heart: RRR, no murmurs Lungs: CTA bilaterally, respirations unlabored on RA Abdomen: Soft, non-distended, +BS Extremities: no edema b/l lower extremities Dialysis Access: R IJ [facility]  Additional Objective Labs: Basic Metabolic Panel: Recent Labs  Lab 08/15/22 0720 08/16/22 0127  NA 138 137  K 3.4* 4.0  CL 102 101  CO2 23 24  GLUCOSE 136* 139*  BUN 21 13  CREATININE 3.64* 2.32*  CALCIUM 9.2 8.5*   Liver Function Tests: Recent Labs  Lab 08/15/22 0720  AST 21  ALT 16  ALKPHOS 73  BILITOT 0.6  PROT 6.6  ALBUMIN 3.5   Recent Labs  Lab 08/15/22 0720  LIPASE 30   CBC: Recent Labs  Lab 08/15/22 0720 08/16/22 0127  WBC 8.4 8.9  HGB 8.9* 7.6*  HCT 26.6* 21.8*  MCV 99.6 97.8  PLT 183 165   Blood Culture    Component Value Date/Time   SDES SCROTUM 03/16/2022 1545   SPECREQUEST TISSUE 03/16/2022 1545   CULT  03/16/2022 1545    ABUNDANT ENTEROCOCCUS FAECALIS FEW PSEUDOMONAS AERUGINOSA MULTI-DRUG RESISTANT ORGANISM ABUNDANT BACTEROIDES OVATUS BETA LACTAMASE POSITIVE CRITICAL RESULT CALLED TO, READ BACK BY AND VERIFIED WITH: PHARMD K PIERCE [id number] AT 1019 BY CM Sent to [facility] for further susceptibility testing. SEE REPORT IN EPIC Performed at [facility], [place] [address] [place], [place]  [address]    REPTSTATUS 04/05/2022 FINAL 03/16/2022 1545    Cardiac Enzymes: No results for input(s): "CKTOTAL", "CKMB", "CKMBINDEX", "TROPONINI" in the last 168 hours. CBG: Recent Labs  Lab 08/15/22 1607 08/16/22 0846  GLUCAP 208* 98   Iron Studies: No results for input(s): "IRON", "TIBC", "TRANSFERRIN", "FERRITIN" in the last 72 hours. '@lablastinr3'$ @ Studies/Results: DG Retrograde Pyelogram  Result Date: 08/15/2022 CLINICAL DATA:  Obstructing bilateral UPJ stones EXAM: RETROGRADE PYELOGRAM COMPARISON:  CT abdomen/pelvis from 08/15/2022. FLUOROSCOPY TIME:  Radiation Exposure Index (if provided by the fluoroscopic device): 9.5 mGy FINDINGS: Multiple spot fluoroscopic retrograde pyelogram images demonstrate stone filling defect at the right UPJ with subsequent placement of bilateral nephroureteral stents with proximal pigtail portions overlying the central renal collecting systems bilaterally and distal pigtail portions overlying the bladder bilaterally. IMPRESSION: Intraoperative fluoroscopic guidance for retrograde pyelogram with bilateral nephroureteral stent placement. Electronically Signed   By: Ilona Sorrel M.D.   On: 08/15/2022 13:14   CT Abdomen Pelvis Wo Contrast  Result Date: 08/15/2022 CLINICAL DATA:  61 year old male with acute abdominal and pelvic pain. EXAM: CT ABDOMEN AND PELVIS WITHOUT CONTRAST TECHNIQUE: Multidetector CT imaging of the abdomen and pelvis was performed following the standard protocol without IV contrast. RADIATION DOSE REDUCTION: This exam was performed according to the departmental dose-optimization program which includes automated exposure control, adjustment of the  mA and/or kV according to patient size and/or use of iterative reconstruction technique. COMPARISON:  03/31/2022 and prior CTs FINDINGS: Please note that parenchymal and vascular abnormalities may be missed as intravenous contrast was not administered. Lower chest: No acute abnormality. Hepatobiliary:  Equivocal mild contour irregularity of the liver is noted. No other hepatic abnormalities are identified. The gallbladder is unremarkable. There is no evidence of intrahepatic or extrahepatic biliary dilatation. Pancreas: Unremarkable Spleen: Splenomegaly again identified. Adrenals/Urinary Tract: A 9 mm RIGHT UPJ calculus causes moderate to severe RIGHT hydronephrosis. An 8 mm LEFT UPJ calculus causes moderate to severe LEFT hydronephrosis. Multiple nonobstructing bilateral renal calculi are again identified. A 5 mm calculus is noted within the dependent portion of the LEFT renal pelvis. The adrenal glands and bladder are within normal limits. Stomach/Bowel: Stomach is within normal limits. Appendix appears normal. No evidence of bowel wall thickening, distention, or inflammatory changes. Vascular/Lymphatic: Aortic atherosclerosis. No enlarged abdominal or pelvic lymph nodes. Reproductive: Prostate is unchanged without definite abnormality. Other: No ascites, focal collection or pneumoperitoneum. Musculoskeletal: No acute or suspicious bony abnormalities are noted. Lumbar spine degenerative changes again noted. IMPRESSION: 1. Bilateral UPJ calculi (9 mm on the RIGHT and 8 mm on the LEFT) causing moderate to severe bilateral hydronephrosis. 2. Nonobstructing bilateral renal calculi. A 5 mm calculus is present within the LEFT renal pelvis. 3. Equivocal mild contour irregularity of the liver which may represent cirrhosis. 4. Splenomegaly. 5. Aortic Atherosclerosis (ICD10-I70.0). Electronically Signed   By: Margarette Canada M.D.   On: 08/15/2022 09:29   Medications:  sodium chloride 10 mL/hr at 08/15/22 1201    allopurinol  100 mg Oral Daily   Chlorhexidine Gluconate Cloth  6 each Topical Q0600   heparin  5,000 Units Subcutaneous Q8H   multivitamin  1 tablet Oral QHS   pantoprazole  40 mg Oral Daily     OP HD: TTS [place] 4h  400/800   91kg  3K/2.5Ca bath RIJ TDC   Hep 4000 - last HD 9/21, post wt 93.8kg -  mircera 225 ug q2, last 9/14, due 9/28 - venofer 100 tiw throu 10/05  Assessment/Plan: Obstructive uropathy - acute abd pain, found to have bilat UPJ calculi causing moderate to severe bilateral hydronephrosis. Taken to OR today and new JJ stents were placed bilaterally per urology.  ESRD - on HD TTS. Started HD in April while admitted for severe septic shock. Is now getting OP HD TTS in [place] via [facility]. Good compliance. Of note, his creatinine is low and he has good urine output. Planning to do 24 hour urine collection outpatient BP/ vol - BP's wnl, not high. No vol excess by exam. Typically feels very poorly post HD, will plan to raise his EDW at discharge Anemia esrd - Hb 7.6 post op, last esa was 9/14, due 9/28 if still here.  MBD ckd - Ca in range, will add on phos. Cont mvi, any binders if taking.  Chronic afib- rate currently controlled    Anice Paganini, PA-C 08/16/2022, 9:34 AM  [facility] Pager: [phone]

## 2022-08-16 NOTE — Discharge Summary (Signed)
Physician Discharge Summary  Jeffrey Ayala:[id number] DOB: [birth year] [name]: 08/15/2022  PCP: [name], NP-C  Admit date: 08/15/2022 Discharge date: 08/16/2022  Admitted From: Home Disposition: Home  Recommendations for Outpatient Follow-up:  Follow up with PCP in 1-2 weeks Please obtain BMP/CBC in one week your next doctors visit.  Outpatient follow-up with urology and nephrology.  Resume routine outpatient dialysis. Urology recommended tolterodine prescription.  Discharge Condition: Stable CODE STATUS: Full code Diet recommendation: Heart healthy  Brief/Interim Summary: 61 year old with history of A-fib, cirrhosis, ESRD on HD TTS presented with abdominal pain.  Had complicated hospitalization from 4/2 - 5/12 with septic shock and cardiac arrest secondary to Fournier's gangrene requiring multiple urology procedures.  Cultures eventually grew Pseudomonas, E faecalis and bacteriocide's.  Also complicated by ATN requiring CRRT eventually transition to hemodialysis.  He had developed vasoplegia requiring treatment with methylene blue infusion and ultimately left TMA and right BKA.  He was discharged to CIR and then shortly after on 5/27 returned for A-fib with RVR treated with metoprolol and Eliquis.  He was taken off Eliquis due to anemia by cardiology and transition to aspirin.   In the ED he was noted to have bilateral nephrolithiasis and urology was consulted.  He underwent cystoscopy with bilateral double-J stent placement on 9/23.  She also received a session of dialysis per nephrology in the hospital.  Following day he was doing better therefore he was cleared for discharge with outpatient follow-up.    Spouse present at bedside during my evaluation.  Discharge instructions given.   Bilateral obstructive uropathy -Causing his pain.  UA does not appear to be infected.  Underwent bilateral double-J stent placement on 9/23 by urology.  Stable for discharge, prescription for  tolterodine given   ESRD on HD TTS -Followed by nephrology.  Planned fistula on 10/6.  Status post HD in the hospital 9/23. Right subclavian HD catheter in place   Chronic afib -Follows outpatient Dr. Audie Box.  Due to anemia he has also seen outpatient heme-onc and GI.  Heme-onc has recommended erythropoietin until completion of work-up.  Continue aspirin for now until complete outpatient GI evaluation   Anemia of chronic disease - Baseline hemoglobin 8.5.  This morning 7.6.  Outpatient hematology, Dr. Alen Blew has recommended erythropoietin administration with HD and as needed PRBC transfusion.   Vasoplegia status post left TMA and right BKA -Previously treated with methylene blue.  Currently starting to walk with prosthesis.   Cirrhosis with history of eosinophilic esophagitis. -Followed by Dr. Havery Moros, last seen on 9/20.  Outpatient EGD/colonoscopy with planned next month -Compensated, undergoing evaluation and scheduled for EGD/colonoscopy next month -PPI    Discharge Diagnoses:  Principal Problem:   Acute bilateral obstructive uropathy Active Problems:   Essential hypertension   Cirrhosis ([place])   Kidney stones   Atrial fibrillation ([place])   Dependence on renal dialysis [facility])      Consultations: Nephrology Urology  Subjective: Feels well no complaints  Discharge Exam: Vitals:   08/16/22 0447 08/16/22 0846  BP: 118/62 104/60  Pulse: 91 (!) 104  Resp: 17 18  Temp: 97.9 F (36.6 C) 98 F (36.7 C)  SpO2: 99% 95%   Vitals:   08/15/22 2350 08/16/22 0001 08/16/22 0447 08/16/22 0846  BP: (!) 114/58 116/64 118/62 104/60  Pulse: (!) 107 99 91 (!) 104  Resp: '20 18 17 18  '$ Temp: 98.7 F (37.1 C) 98.5 F (36.9 C) 97.9 F (36.6 C) 98 F (36.7 C)  TempSrc: Oral Oral  Oral Oral  SpO2:  98% 99% 95%  Weight: 96.8 kg     Height:        General: Pt is alert, awake, not in acute distress Cardiovascular: RRR, S1/S2 +, no rubs, no gallops Respiratory: CTA  bilaterally, no wheezing, no rhonchi Abdominal: Soft, NT, ND, bowel sounds + Extremities: no edema, no cyanosis, left-sided TMA right-sided BKA  Discharge Instructions   Allergies as of 08/16/2022       Reactions   Codeine Hives   Insomnia, jittery   Flomax [tamsulosin Hcl] Nausea Only   Nausea and fatigue        Medication List     TAKE these medications    acetaminophen 650 MG CR tablet Commonly known as: TYLENOL Take 650 mg by mouth every 8 (eight) hours as needed for pain or fever.   allopurinol 100 MG tablet Commonly known as: Zyloprim Take 1 tablet (100 mg total) by mouth daily.   multivitamin Tabs tablet Take 1 tablet by mouth at bedtime.   pantoprazole 40 MG tablet Commonly known as: PROTONIX Take 1 tablet (40 mg total) by mouth 2 (two) times daily. What changed: when to take this   Plenvu 140 g Solr Generic drug: PEG-KCl-NaCl-NaSulf-Na Asc-C Use as directed   tolterodine 4 MG 24 hr capsule Commonly known as: Detrol LA Take 1 capsule (4 mg total) by mouth daily.   traZODone 50 MG tablet Commonly known as: DESYREL TAKE 1/2 OR 1 TABLET BY MOUTH AT BEDTIME AS NEEDED FOR SLEEP What changed: See the new instructions.        Follow-up Information     Henson, Vickie L, NP-C Follow up in 1 week(s).   Specialty: Family Medicine Contact information: [place] [address] [phone]         Geralynn Rile, MD .   Specialties: Cardiology, Internal Medicine, Radiology Contact information: [place] [address] [phone]         [name], NP-C .   Specialty: Family Medicine Contact information: [place] [address] [phone]         Geralynn Rile, MD .   Specialties: Cardiology, Internal Medicine, Radiology Contact information: [place] [address] [phone]                Allergies  Allergen Reactions   Codeine  Hives    Insomnia, jittery   Flomax [Tamsulosin Hcl] Nausea Only    Nausea and fatigue    You were cared for by a hospitalist during your hospital stay. If you have any questions about your discharge medications or the care you received while you were in the hospital after you are discharged, you can call the unit and asked to speak with the hospitalist on call if the hospitalist that took care of you is not available. Once you are discharged, your primary care physician will handle any further medical issues. Please note that no refills for any discharge medications will be authorized once you are discharged, as it is imperative that you return to your primary care physician (or establish a relationship with a primary care physician if you do not have one) for your aftercare needs so that they can reassess your need for medications and monitor your lab values.   Procedures/Studies: DG Retrograde Pyelogram  Result Date: 08/15/2022 CLINICAL DATA:  Obstructing bilateral UPJ stones EXAM: RETROGRADE PYELOGRAM COMPARISON:  CT abdomen/pelvis from 08/15/2022. FLUOROSCOPY TIME:  Radiation  Exposure Index (if provided by the fluoroscopic device): 9.5 mGy FINDINGS: Multiple spot fluoroscopic retrograde pyelogram images demonstrate stone filling defect at the right UPJ with subsequent placement of bilateral nephroureteral stents with proximal pigtail portions overlying the central renal collecting systems bilaterally and distal pigtail portions overlying the bladder bilaterally. IMPRESSION: Intraoperative fluoroscopic guidance for retrograde pyelogram with bilateral nephroureteral stent placement. Electronically Signed   By: Ilona Sorrel M.D.   On: 08/15/2022 13:14   CT Abdomen Pelvis Wo Contrast  Result Date: 08/15/2022 CLINICAL DATA:  61 year old male with acute abdominal and pelvic pain. EXAM: CT ABDOMEN AND PELVIS WITHOUT CONTRAST TECHNIQUE: Multidetector CT imaging of the abdomen and pelvis was performed  following the standard protocol without IV contrast. RADIATION DOSE REDUCTION: This exam was performed according to the departmental dose-optimization program which includes automated exposure control, adjustment of the mA and/or kV according to patient size and/or use of iterative reconstruction technique. COMPARISON:  03/31/2022 and prior CTs FINDINGS: Please note that parenchymal and vascular abnormalities may be missed as intravenous contrast was not administered. Lower chest: No acute abnormality. Hepatobiliary: Equivocal mild contour irregularity of the liver is noted. No other hepatic abnormalities are identified. The gallbladder is unremarkable. There is no evidence of intrahepatic or extrahepatic biliary dilatation. Pancreas: Unremarkable Spleen: Splenomegaly again identified. Adrenals/Urinary Tract: A 9 mm RIGHT UPJ calculus causes moderate to severe RIGHT hydronephrosis. An 8 mm LEFT UPJ calculus causes moderate to severe LEFT hydronephrosis. Multiple nonobstructing bilateral renal calculi are again identified. A 5 mm calculus is noted within the dependent portion of the LEFT renal pelvis. The adrenal glands and bladder are within normal limits. Stomach/Bowel: Stomach is within normal limits. Appendix appears normal. No evidence of bowel wall thickening, distention, or inflammatory changes. Vascular/Lymphatic: Aortic atherosclerosis. No enlarged abdominal or pelvic lymph nodes. Reproductive: Prostate is unchanged without definite abnormality. Other: No ascites, focal collection or pneumoperitoneum. Musculoskeletal: No acute or suspicious bony abnormalities are noted. Lumbar spine degenerative changes again noted. IMPRESSION: 1. Bilateral UPJ calculi (9 mm on the RIGHT and 8 mm on the LEFT) causing moderate to severe bilateral hydronephrosis. 2. Nonobstructing bilateral renal calculi. A 5 mm calculus is present within the LEFT renal pelvis. 3. Equivocal mild contour irregularity of the liver which may  represent cirrhosis. 4. Splenomegaly. 5. Aortic Atherosclerosis (ICD10-I70.0). Electronically Signed   By: Margarette Canada M.D.   On: 08/15/2022 09:29   VAS [place] UPPER EXTREMITY ARTERIAL DUPLEX  Result Date: 08/05/2022  UPPER EXTREMITY DUPLEX STUDY Patient Name:  RAFAL ARCHULETA  Date of Exam:   08/05/2022 Medical Rec #: [id number]         Accession #:    [id number] Date of Birth: 02/14/1961         Patient Gender: M Patient Age:   89 years Exam Location:  [facility] Procedure:      VAS [place] UPPER EXTREMITY ARTERIAL DUPLEX Referring Phys: Servando Snare --------------------------------------------------------------------------------  Indications: Pre Op AVF.  Risk Factors:  Hypertension, prior MI. Other Factors: Recent Sepstic Shock Comparison Study: Prior vein map 04/03/22 Performing Technologist: Elta Guadeloupe RVT, RDMS  Examination Guidelines: A complete evaluation includes B-mode imaging, spectral Doppler, color Doppler, and power Doppler as needed of all accessible portions of each vessel. Bilateral testing is considered an integral part of a complete examination. Limited examinations for reoccurring indications may be performed as noted.  Right Pre-Dialysis Findings: +----------------------------+----------+-------------------+---------+--------+ Location  PSV (cm/s)Intralum. Diam.    Waveform Comments                                       (cm)                                 +----------------------------+----------+-------------------+---------+--------+ Radial artery distal upper  46        0.2                no plaque         arm                                                                        +----------------------------+----------+-------------------+---------+--------+ Ulnar artery distal upper   96        0.4                no plaque         arm                                                                         +----------------------------+----------+-------------------+---------+--------+ Brachial Antecub. fossa     73        0.38               triphasic         +----------------------------+----------+-------------------+---------+--------+ Radial Art at Wrist         72        0.21               triphasic         +----------------------------+----------+-------------------+---------+--------+ Ulnar Art at Wrist          87        0.14               triphasic         +----------------------------+----------+-------------------+---------+--------+   Left Pre-Dialysis Findings: +----------------------------+----------+-------------------+---------+--------+ Location                    PSV (cm/s)Intralum. Diam.    Waveform Comments                                       (cm)                                 +----------------------------+----------+-------------------+---------+--------+ Radial artery distal upper  92        0.2                no plaque         arm                                                                        +----------------------------+----------+-------------------+---------+--------+  Ulnar artery distal upper   87        0.3                no plaque         arm                                                                        +----------------------------+----------+-------------------+---------+--------+ Brachial Antecub. fossa     81        0.32               triphasic         +----------------------------+----------+-------------------+---------+--------+ Radial Art at Wrist         65        0.22               triphasic         +----------------------------+----------+-------------------+---------+--------+ Ulnar Art at Wrist          100       0.11               triphasic         +----------------------------+----------+-------------------+---------+--------+  Electronically signed by Servando Snare MD on 08/05/2022  at 4:01:45 PM.    Final    [place] Extrem Low Right Ltd  Result Date: 07/23/2022 MS[facility] Limited lower extremity ultrasound performed, right Both short and long axis evaluation of the right lower extremity (s/p BKA) was performed today.  Evaluation of the extremity stump noted mixed echogenic texture deep to the superficial subcutaneous layer.  There is some scattered hypoechoic pockets, indicative of small amounts of fluid with cobblestoning present.  There is notable hyperemia surrounding this.  Evaluation of the anterior proximal tibial stump was noted without surrounding hypoechoic fluid or cortical irregularity.  I cannot visualize any pockets of walled off abscess.  No sonographic fluctuance noted.    Cobblestone noted in the soft tissue of the distal stump of the right BKA with hyperemia, suggestive of cellulitis without clear abscess formation.     The results of significant diagnostics from this hospitalization (including imaging, microbiology, ancillary and laboratory) are listed below for reference.     Microbiology: No results found for this or any previous visit (from the past 240 hour(s)).   Labs: BNP (last 3 results) Recent Labs    02/22/22 1805  BNP 629.5*   Basic Metabolic Panel: Recent Labs  Lab 08/15/22 0720 08/16/22 0127  NA 138 137  K 3.4* 4.0  CL 102 101  CO2 23 24  GLUCOSE 136* 139*  BUN 21 13  CREATININE 3.64* 2.32*  CALCIUM 9.2 8.5*   Liver Function Tests: Recent Labs  Lab 08/15/22 0720  AST 21  ALT 16  ALKPHOS 73  BILITOT 0.6  PROT 6.6  ALBUMIN 3.5   Recent Labs  Lab 08/15/22 0720  LIPASE 30   No results for input(s): "AMMONIA" in the last 168 hours. CBC: Recent Labs  Lab 08/15/22 0720 08/16/22 0127  WBC 8.4 8.9  HGB 8.9* 7.6*  HCT 26.6* 21.8*  MCV 99.6 97.8  PLT 183 165   Cardiac Enzymes: No results for input(s): "CKTOTAL", "CKMB", "CKMBINDEX", "TROPONINI" in the last 168 hours. BNP: Invalid input(s): "POCBNP" CBG: Recent  Labs   Lab 08/15/22 1607 08/16/22 0846  GLUCAP 208* 98   D-Dimer No results for input(s): "DDIMER" in the last 72 hours. Hgb A1c No results for input(s): "HGBA1C" in the last 72 hours. Lipid Profile No results for input(s): "CHOL", "HDL", "LDLCALC", "TRIG", "CHOLHDL", "LDLDIRECT" in the last 72 hours. Thyroid function studies No results for input(s): "TSH", "T4TOTAL", "T3FREE", "THYROIDAB" in the last 72 hours.  Invalid input(s): "FREET3" Anemia work up No results for input(s): "VITAMINB12", "FOLATE", "FERRITIN", "TIBC", "IRON", "RETICCTPCT" in the last 72 hours. Urinalysis    Component Value Date/Time   COLORURINE YELLOW 08/15/2022 0716   APPEARANCEUR CLEAR 08/15/2022 0716   LABSPEC 1.016 08/15/2022 0716   PHURINE 6.0 08/15/2022 0716   GLUCOSEU NEGATIVE 08/15/2022 0716   HGBUR NEGATIVE 08/15/2022 0716   BILIRUBINUR NEGATIVE 08/15/2022 0716   KETONESUR NEGATIVE 08/15/2022 0716   PROTEINUR NEGATIVE 08/15/2022 0716   NITRITE NEGATIVE 08/15/2022 0716   LEUKOCYTESUR NEGATIVE 08/15/2022 0716   Sepsis Labs Recent Labs  Lab 08/15/22 0720 08/16/22 0127  WBC 8.4 8.9   Microbiology No results found for this or any previous visit (from the past 240 hour(s)).   Time coordinating discharge:  I have spent 35 minutes face to face with the patient and on the ward discussing the patients care, assessment, plan and disposition with other care givers. >50% of the time was devoted counseling the patient about the risks and benefits of treatment/Discharge disposition and coordinating care.   SIGNED:   Damita Lack, MD  [facility] 08/16/2022, 11:31 AM   If 7PM-7AM, please contact night-coverage

## 2022-08-17 ENCOUNTER — Encounter: Payer: BC Managed Care – PPO | Admitting: Physical Therapy

## 2022-08-17 ENCOUNTER — Other Ambulatory Visit (INDEPENDENT_AMBULATORY_CARE_PROVIDER_SITE_OTHER): Payer: BC Managed Care – PPO

## 2022-08-17 DIAGNOSIS — D649 Anemia, unspecified: Secondary | ICD-10-CM

## 2022-08-17 DIAGNOSIS — K7460 Unspecified cirrhosis of liver: Secondary | ICD-10-CM | POA: Diagnosis not present

## 2022-08-17 DIAGNOSIS — Z1211 Encounter for screening for malignant neoplasm of colon: Secondary | ICD-10-CM | POA: Diagnosis not present

## 2022-08-17 DIAGNOSIS — N186 End stage renal disease: Secondary | ICD-10-CM | POA: Diagnosis not present

## 2022-08-17 DIAGNOSIS — Z992 Dependence on renal dialysis: Secondary | ICD-10-CM

## 2022-08-17 LAB — IBC + FERRITIN
Ferritin: 624.7 ng/mL — ABNORMAL HIGH (ref 22.0–322.0)
Iron: 26 ug/dL — ABNORMAL LOW (ref 42–165)
Saturation Ratios: 12 % — ABNORMAL LOW (ref 20.0–50.0)
TIBC: 217 ug/dL — ABNORMAL LOW (ref 250.0–450.0)
Transferrin: 155 mg/dL — ABNORMAL LOW (ref 212.0–360.0)

## 2022-08-17 LAB — HEPATITIS B SURFACE ANTIBODY, QUANTITATIVE: Hep B S AB Quant (Post): 72.8 m[IU]/mL (ref >9.9)

## 2022-08-18 DIAGNOSIS — Z992 Dependence on renal dialysis: Secondary | ICD-10-CM | POA: Diagnosis not present

## 2022-08-18 DIAGNOSIS — N2581 Secondary hyperparathyroidism of renal origin: Secondary | ICD-10-CM | POA: Diagnosis not present

## 2022-08-18 DIAGNOSIS — N186 End stage renal disease: Secondary | ICD-10-CM | POA: Diagnosis not present

## 2022-08-19 ENCOUNTER — Telehealth: Payer: Self-pay | Admitting: *Deleted

## 2022-08-19 ENCOUNTER — Encounter: Payer: BC Managed Care – PPO | Admitting: Physical Therapy

## 2022-08-19 ENCOUNTER — Encounter: Payer: Self-pay | Admitting: *Deleted

## 2022-08-19 NOTE — Patient Outreach (Signed)
  Care Coordination [facility] Note Transition Care Management Follow-up Telephone Call Date of discharge and from where: Sunday, 08/16/22 [place]: acute bilateral obstructive uropathy How have you been since you were released from the hospital? "Overall I am doing fine, everything is going okay; I got the medicine they gave me to help my urine flow be less uncomfortable, and people were out here at my home today to see if I can start doing hemodialysis at home. I am going to hemodialysis and doing fine" Any questions or concerns? No  Items Reviewed: Did the pt receive and understand the discharge instructions provided? Yes  Medications obtained and verified? Yes  Other? No  Any new allergies since your discharge? No  Dietary orders reviewed? Yes Do you have support at home? Yes  wife assists as indicated/ needed  Home Care and Equipment/Supplies: Were home health services ordered? no If so, what is the name of the agency? N/A  Has the agency set up a time to come to the patient's home? not applicable Were any new equipment or medical supplies ordered?  No What is the name of the medical supply agency? N/A Were you able to get the supplies/equipment? not applicable Do you have any questions related to the use of the equipment or supplies? No N/A  Functional Questionnaire: (I = Independent and D = Dependent) ADLs: I  wife assists as indicated/ needed  Bathing/Dressing- I  Meal Prep- I  wife assists as indicated/ needed  Eating- I  Maintaining continence- I  Transferring/Ambulation- I  Managing Meds- I  wife assists as indicated/ needed  Follow up appointments reviewed:  PCP Hospital f/u appt confirmed? Yes  Scheduled to see PCP Harland Dingwall NP on Wednesday 08/26/22 @ 8:20 am [facility] f/u appt confirmed? No  Scheduled to see - on - @ - Are transportation arrangements needed? No  If their condition worsens, is the pt aware to call PCP or go to the Emergency Dept.?  Yes Was the patient provided with contact information for the PCP's office or ED? Yes Was to pt encouraged to call back with questions or concerns? Yes  SDOH assessments and interventions completed:   Yes  Care Coordination Interventions Activated:  Yes   Care Coordination Interventions:  Provided education around generally what to expect with upcoming AV graft placement procedure     Encounter Outcome:  Pt. Visit Completed    Oneta Rack, RN, BSN, CCRN Alumnus RN CM Care Coordination/ Transition of [place] Management [phone]: direct office

## 2022-08-20 DIAGNOSIS — N186 End stage renal disease: Secondary | ICD-10-CM | POA: Diagnosis not present

## 2022-08-20 DIAGNOSIS — N2581 Secondary hyperparathyroidism of renal origin: Secondary | ICD-10-CM | POA: Diagnosis not present

## 2022-08-20 DIAGNOSIS — Z992 Dependence on renal dialysis: Secondary | ICD-10-CM | POA: Diagnosis not present

## 2022-08-22 DIAGNOSIS — N2581 Secondary hyperparathyroidism of renal origin: Secondary | ICD-10-CM | POA: Diagnosis not present

## 2022-08-22 DIAGNOSIS — Z992 Dependence on renal dialysis: Secondary | ICD-10-CM | POA: Diagnosis not present

## 2022-08-22 DIAGNOSIS — N186 End stage renal disease: Secondary | ICD-10-CM | POA: Diagnosis not present

## 2022-08-23 DIAGNOSIS — N170 Acute kidney failure with tubular necrosis: Secondary | ICD-10-CM | POA: Diagnosis not present

## 2022-08-23 DIAGNOSIS — N186 End stage renal disease: Secondary | ICD-10-CM | POA: Diagnosis not present

## 2022-08-23 DIAGNOSIS — Z992 Dependence on renal dialysis: Secondary | ICD-10-CM | POA: Diagnosis not present

## 2022-08-24 ENCOUNTER — Ambulatory Visit (INDEPENDENT_AMBULATORY_CARE_PROVIDER_SITE_OTHER): Payer: BC Managed Care – PPO | Admitting: Physical Therapy

## 2022-08-24 ENCOUNTER — Encounter: Payer: Self-pay | Admitting: Physical Therapy

## 2022-08-24 DIAGNOSIS — R2681 Unsteadiness on feet: Secondary | ICD-10-CM | POA: Diagnosis not present

## 2022-08-24 DIAGNOSIS — M21372 Foot drop, left foot: Secondary | ICD-10-CM

## 2022-08-24 DIAGNOSIS — L98498 Non-pressure chronic ulcer of skin of other sites with other specified severity: Secondary | ICD-10-CM | POA: Diagnosis not present

## 2022-08-24 DIAGNOSIS — R609 Edema, unspecified: Secondary | ICD-10-CM

## 2022-08-24 DIAGNOSIS — M6281 Muscle weakness (generalized): Secondary | ICD-10-CM | POA: Diagnosis not present

## 2022-08-24 DIAGNOSIS — R2689 Other abnormalities of gait and mobility: Secondary | ICD-10-CM

## 2022-08-24 DIAGNOSIS — M79661 Pain in right lower leg: Secondary | ICD-10-CM

## 2022-08-24 NOTE — Therapy (Signed)
OUTPATIENT PHYSICAL THERAPY TREATMENT NOTE & RECERTIFICATION   Patient Name: Jeffrey Ayala MRN: [id number] DOB:[birth year], 61 y.o., male Today's Date: 08/24/2022  PCP: [name], NP-C REFERRING PROVIDER: [name], MD   END OF SESSION:   PT End of Session - 08/24/22 0845     Visit Number 12    Number of Visits 20    Date for PT Re-Evaluation 11/20/22    Authorization Type [employer] comm PPO    Authorization Time Period 20 visit limit PT/OT/chiro, $35 co-pay    Authorization - Visit Number 12    Authorization - Number of Visits 20    Progress Note Due on Visit 32    PT Start Time 0845    PT Stop Time 0930    PT Time Calculation (min) 45 min    Equipment Utilized During Treatment Gait belt    Activity Tolerance Patient tolerated treatment well;Patient limited by fatigue    Behavior During Therapy [facility] for tasks assessed/performed;Flat affect               Past Medical History:  Diagnosis Date   Atrial fibrillation, chronic ([name])    not on [facility]   Cardiac arrest ([place]) 02/2022   in the setting of septic shock   Cirrhosis of liver ([place])    Concussion    Eosinophilic esophagitis    EGD/bx by Dr. Havery Moros   ESRD needing dialysis [facility])    GERD (gastroesophageal reflux disease)    History of gastric ulcer    History of kidney stones    Intermittent dysphagia    Migraine    Scrotal infection 02/2022   Septic shock ([place]) 02/2022   Vasoplegia - > L TMA and R BKA   Past Surgical History:  Procedure Laterality Date   AMPUTATION Right 03/15/2022   Procedure: RIGHT BELOW KNEE AMPUTATION;  Surgeon: [name], MD;  Location: [place];  Service: Orthopedics;  Laterality: Right;   CYSTOSCOPY     CYSTOSCOPY WITH STENT PLACEMENT Bilateral 08/15/2022   Procedure: CYSTOSCOPY WITH STENT PLACEMENT;  Surgeon: Remi Haggard, MD;  Location: [place];  Service: Urology;  Laterality: Bilateral;   DENTAL SURGERY     FINGER SURGERY     HERNIA REPAIR     at age 26   IR  FLUORO GUIDE CV LINE RIGHT  03/09/2022   IR PATIENT EVAL TECH 0-60 MINS  03/09/2022   IR US GUIDE VASC ACCESS RIGHT  03/09/2022   KNEE ARTHROSCOPY Left    LITHOTRIPSY     NEPHROLITHOTOMY     SCROTAL EXPLORATION N/A 03/16/2022   Procedure: IRRIGATION AND DEBRIDEMENT SCROTUM;  Surgeon: Raynelle Bring, MD;  Location: [place];  Service: Urology;  Laterality: N/A;   SCROTAL EXPLORATION N/A 03/28/2022   Procedure: IRRIGATION AND DEBRIDEMENT SCROTUM;  Surgeon: Ardis Hughs, MD;  Location: [place];  Service: Urology;  Laterality: N/A;   TONSILLECTOMY     TOTAL KNEE ARTHROPLASTY Bilateral 11/04/2018   Procedure: BILATERAL TOTAL KNEE ARTHROPLASTY;  Surgeon: [name], MD;  Location: [place];  Service: Orthopedics;  Laterality: Bilateral;  spinal/epidural per anesthesiologist   TRANSMETATARSAL AMPUTATION Left 03/15/2022   Procedure: LEFT TRANSMETATARSAL AMPUTATION;  Surgeon: [name], MD;  Location: [place];  Service: Orthopedics;  Laterality: Left;   Patient Active Problem List   Diagnosis Date Noted   Acute bilateral obstructive uropathy 08/15/2022   Skin breakdown 07/29/2022   Suicidal ideations 06/09/2022   Chills (without fever) 06/09/2022  Severe depression ([place]) 06/09/2022   Acute cough 06/09/2022   Reactive depression 05/31/2022   Belching 05/27/2022   Gastroesophageal reflux disease 05/27/2022   Atrial fibrillation ([place]) 05/20/2022   Long term (current) use of anticoagulants 05/20/2022   S/P transmetatarsal amputation of foot, left ([place]) 04/18/2022   CKD (chronic kidney disease) stage 5, GFR less than 15 ml/min ([place]) 04/18/2022   Secondary hyperparathyroidism of renal origin ([place]) 04/10/2022   Pruritus, unspecified 04/10/2022   Nausea 04/10/2022   Iron deficiency anemia, unspecified 04/10/2022   Hypothyroidism, unspecified 04/10/2022   Other acute kidney failure ([place]) 04/10/2022   Dyspnea, unspecified 45/40/9811   Eosinophilic esophagitis 91/47/8295   Gastric ulcer,  unspecified as acute or chronic, without hemorrhage or perforation 04/10/2022   Coagulation defect, unspecified ([place]) 04/10/2022   Allergy, unspecified, initial encounter 04/10/2022   Anaphylactic shock, unspecified, initial encounter 04/10/2022   Arthralgia of right lower leg    Acute blood loss anemia    Unilateral complete BKA, right, initial encounter ([place]) 04/03/2022   S/P BKA (below knee amputation), right ([place]) 04/03/2022   Old myocardial infarction 04/02/2022   Dependence on renal dialysis ([place]) 04/02/2022   Acquired absence of right leg below knee ([place]) 04/02/2022   Cirrhosis ([place]) 03/31/2022   Fournier's gangrene in male    Scrotal infection 03/25/2022   Normocytic anemia 03/25/2022   Diarrhea 03/25/2022   Shock liver 03/25/2022   Acute respiratory failure with hypoxia ([place]) 03/25/2022   Gout 03/25/2022   Acute nonintractable headache 03/25/2022   Demand ischemia 03/25/2022   Thrombocytopenia ([place]) 03/25/2022   Obesity, Class III, BMI 40-49.9 (morbid obesity) ([place]) 03/25/2022   Cardiac arrest ([place]) 03/25/2022   Hypotension    Respiratory insufficiency    Cellulitis    Malnutrition of moderate degree 03/12/2022   Altered mental status    Gangrene of right foot ([place])    Gangrene of left foot ([place])    Severe protein-calorie malnutrition ([place]) 03/10/2022   Encounter for central line placement    Pressure injury of skin 02/23/2022   Septic shock ([place]) 02/22/2022   AKI (acute kidney injury) ([place])    Generalized abdominal pain    Lactic acidosis    Atrial fibrillation with RVR ([name])    Hypokalemia    Hypomagnesemia    Pain in finger of right hand 08/14/2021   Degenerative joint disease of shoulder region 05/07/2020   Olecranon bursitis of right elbow 05/07/2020   Pain in joint of right shoulder 04/26/2020   S/P TKR (total knee replacement), bilateral 11/04/2018   Bilateral primary osteoarthritis of knee    DDD (degenerative disc disease), cervical 01/10/2018    Rupture of medial head of gastrocnemius, left, sequela 01/14/2017   Kidney stones 06/22/2016   Essential hypertension 08/01/2010   DYSTHYMIC DISORDER 07/08/2010   PALPITATIONS 07/08/2010    REFERRING DIAG: Z89.511 (ICD-10-CM) - Hx of below knee amputation, right   ONSET DATE: 06/19/2022 Prosthesis delivery  THERAPY DIAG:  Other abnormalities of gait and mobility  Muscle weakness (generalized)  Unsteadiness on feet  Non-pressure chronic ulcer of skin of other sites with other specified severity ([name])  Foot drop, left  Edema, unspecified type  Pain in right lower leg  Rationale for Evaluation and Treatment Rehabilitation  PERTINENT HISTORY: Rt BKA 03/15/22, Left MTA 03/15/2022, bil. TKA 11/04/18, ESRD / chronic kidney disease st5 dialysis Tues/Thurs/Sat, cirrhosis, cardiac arrest May 23 post intubation, A-Fib  PRECAUTIONS: Fall, BP only in RUE starting 10/6  SUBJECTIVE:   patient received new  socket with vacuum pin suspension on Friday and it feels better.  He did not wear it to dialysis Saturday as uncertain of weight of prosthesis.  He has been wearing it most of awake hours without drying limb / liner.   PAIN:  Are you having pain? Distal residual limb 0/10 when sitting & standing,1/10 with gait   OBJECTIVE: (objective measures completed at initial evaluation unless otherwise dated)    COGNITION: 06/22/2022  Overall cognitive status: Within functional limits for tasks assessed and pt had flat affect so unable to fully assess cognition.   POSTURE: 06/22/2022  rounded shoulders, forward head, flexed trunk , and weight shift left   LOWER EXTREMITY ROM:   ROM P:passive  A:active Right Eval 06/22/2022 Left Eval 06/22/2022  Hip flexion      Hip extension Standing A: ~30* limitation Standing A: ~30* limitation  Hip abduction      Hip adduction      Hip internal rotation      Hip external rotation      Knee flexion      Knee extension Standing A: ~15* limitation  Standing A: ~15* limitation  Ankle dorsiflexion   Standing A: ~25* limitation  Ankle plantarflexion      Ankle inversion      Ankle eversion       (Blank rows = not tested)   LOWER EXTREMITY MMT:   MMT Right Eval 06/22/2022 Left Eval 06/22/2022  Hip flexion 4/5 4/5  Hip extension 2/5 functional 2/5 functional  Hip abduction 2/5 functional 2/5 functional  Hip adduction      Hip internal rotation      Hip external rotation      Knee flexion      Knee extension 3-/5 functional 3-/5 functional  Ankle dorsiflexion 2-/5    Ankle plantarflexion      Ankle inversion      Ankle eversion      (Blank rows = not tested)   TRANSFERS: 06/22/2022  Sit to stand: Mod A and from 22" w/c requiring armrests to push & RW to stabilize 06/22/2022  Stand to sit: Min A from RW to 22" w/c unable to release RW to reach for w/c until seated.   GAIT: 06/22/2022 Gait pattern: step to pattern, decreased step length- Left, decreased stance time- Right, decreased stride length, decreased hip/knee flexion- Right, circumduction- Right, Right hip hike, knee flexed in stance- Right, knee flexed in stance- Left, antalgic, trunk flexed, abducted- Right, and poor foot clearance- Left excessive weightbearing BUEs on RW Distance walked: 5' Assistive device utilized: Environmental consultant - 2 wheeled and right TTA prosthesis / left plate & shoe filler Level of assistance: Mod A Comments: post gait HR 82, SpO2 99% with dyspnea noted   FUNCTIONAL TESTs:  08/24/2022 Merrilee Jansky Balance 12/56  [facility] PT Assessment - 08/24/22 0845       Standardized Balance Assessment   Standardized Balance Assessment Berg Balance Test      Berg Balance Test   Sit to Stand Needs minimal aid to stand or to stabilize    Standing Unsupported Needs several tries to stand 30 seconds unsupported    Sitting with Back Unsupported but Feet Supported on Floor or Stool Able to sit safely and securely 2 minutes    Stand to Sit Controls descent by using hands     Transfers Able to transfer safely, definite need of hands    Standing Unsupported with Eyes Closed Needs help to keep from falling  Standing Unsupported with Feet Together Needs help to attain position and unable to hold for 15 seconds    From Standing, Reach Forward with Outstretched Arm Loses balance while trying/requires external support    From Standing Position, Pick up Object from Floor Unable to try/needs assist to keep balance    From Standing Position, Turn to Look Behind Over each Shoulder Needs assist to keep from losing balance and falling    Turn 360 Degrees Needs assistance while turning    Standing Unsupported, Alternately Place Feet on Step/Stool Needs assistance to keep from falling or unable to try    Standing Unsupported, One Foot in Front Loses balance while stepping or standing    Standing on One Leg Unable to try or needs assist to prevent fall    Total Score 12              06/22/2022  Static standing balance with BUE support on RW for 1 min with minA.     CURRENT PROSTHETIC WEAR ASSESSMENT: 06/22/2022 Patient is dependent with: skin check, residual limb care, care of non-amputated limb, prosthetic cleaning, ply sock cleaning, correct ply sock adjustment, proper wear schedule/adjustment, and proper weight-bearing schedule/adjustment Donning prosthesis: Total A and max cues required, Doffing prosthesis: Total A and wife able to doffe Prosthetic wear tolerance: 1-2 hours 1-2x/day, for 3 of 3 days since delivery.  Prosthetic weight bearing tolerance: 0 minutes unable to stand outside of PT Edema: pitting with >5sec capillary refill Residual limb condition: 42m wound with serous drainage & black scab present,  calf measurement is 42.2cm Prosthetic description:  silicon liner with pin suspension, flexible inner socket Total Contact design, dynamic response foot with hydraulic PF/DF K code/activity level with prosthetic use: Level 3     TODAY'S TREATMENT:   08/24/2022 Prosthetic Training with TTA prosthesis: Wound on residual limb is continuing to heal and is flat across without pinprick appearance, PT advised to continue with Vivewear under liner & continuous monitoring of raised scab or suture expulsion and pt and wife verbalized understanding. PT demo & verbal cues on donning new Vacuum Pin System. Pt verbalized & return understanding.  New socket / prosthesis weighs 3.6kg. pt verbalized understanding to adjust if he wears to dialysis. We will need to reweigh it when prosthetist switches socket to laminated version. Pt verbalized understanding.  Pt ambulated 150' with RW safely.  PT recommended not sitting in w/c in home when wearing prosthesis to facilitate mobility by walking with RW.  Pt neg ramp & curb with RW with supervision. PT recommended entering /exiting their home with wife supervision and initiate community mobility ambulating with wife following with w/c. Pt & wife verbalized understanding.  Pt amb 20' including 180* turn with cane stand alone tip with modA for balance issues.   08/12/2022 Prosthetic Training with TTA prosthesis: Wound on residual limb is continuing to heal and is flat across without pinprick appearance, PT advised continuous monitoring of raised scab or suture expulsion and pt and wife verbalized understanding. PT educated on re-weighing new system to provide info for dialysis to subtract from weigh in & out, pt and wife verbalized understanding Pt educated on signs of sweating and management with drying liner and limb, pt verbalized understanding and received printed handout of information  Neuro Re-ed: Standing on floor eyes open feet ~4-5" apart head turns right/left, up/down & 2 diagonals with frequent touch //bars due to anterior losses of balance Standing eyes closed feet ~4-5" apart frequent UE support and  able to stand ~2s at a time, anterior and posterior losses of balance with weight shift left Standing on  floor eyes closed feet ~4-5" apart head turns right/left, up/down & 2 diagonals back of hand BUE support on //bars, occasional anterior losses of balance and pt required frequent verbal and tactile cueing for upright posture with reported fatigue following activity  08/10/2022 Prosthetic Training with TTA prosthesis: PT educated on consistent prosthesis wear for skin and edema concerns, donning socks after checking alignment of liner and prosthesis, management of burning pain due to friction from liner donning. pt verbalized understanding Prosthetist ground down anterior hard socket in front of distal tibia. Wound on residual limb is 10m across and circular with scab covering, with one darker spot in center of wound PT reviewed prosthesis donning with liner and pin alignment, seated positioning. Pt verbalized understanding PT advised to ask MD about UE restrictions following fistula surgery, pt and wife verbalized understanding STS w/c to RW x 3 with verbal cueing for hand placement Pt reached 10" with single UE assist on RW with SBA Pt turned over each shoulder with decreased rotation and minA to control walker with turn to Lt, turn to Rt was SBA Pt amb 135' with SBA and heavy UE support on RW due to pain, increased to 5/10 posterior calf constant pain and pain decreased with limb elevation PT advised consistent wear and short walks with RW to increase tolerance, elevating leg to decrease pain. pt and wife verbalized understanding   HOME EXERCISE PROGRAM:  AT [place] [address] FEET EQUAL DISTANCE FROM THE MIDLINE.  Try to find this position when standing still for activities.   USE TAPE ON FLOOR TO MARK THE MIDLINE POSITION which is even with middle of sink.  You also should try to feel with your limb pressure in socket.  You are trying to feel with limb what you used to feel with the bottom of your foot.  Side to Side Shift: Moving your hips only (not shoulders): move  weight onto your left leg, HOLD/FEEL pressure in socket.  Move back to equal weight on each leg, HOLD/FEEL pressure in socket. Move weight onto your right leg, HOLD/FEEL pressure in socket. Move back to equal weight on each leg, HOLD/FEEL pressure in socket. Repeat.  Start with both hands on sink, progress to hand on prosthetic side only, then no hands.  Front to Back Shift: Moving your hips only (not shoulders): move your weight forward onto your toes, HOLD/FEEL pressure in socket. Move your weight back to equal Flat Foot on both legs, HOLD/FEEL  pressure in socket. Move your weight back onto your heels, HOLD/FEEL  pressure in socket. Move your weight back to equal on both legs, HOLD/FEEL  pressure in socket. Repeat.  Start with both hands on sink, progress to hand on prosthetic side only, then no hands.  Moving Cones / Cups: With equal weight on each leg: Hold on with one hand the first time, then progress to no hand supports. Move cups from one side of sink to the other. Place cups ~2" out of your reach, progress to 10" beyond reach.  Place one hand in middle of sink and reach with other hand. Do both arms.  Then hover one hand and move cups with other hand.  Overhead/Upward Reaching: alternated reaching up to top cabinets or ceiling if no cabinets present. Keep equal weight on each leg. Start with one hand support on counter while other hand reaches  and progress to no hand support with reaching.  ace one hand in middle of sink and reach with other hand. Do both arms.  Then hover one hand and move cups with other hand.  5.   Looking Over Shoulders: With equal weight on each leg: alternate turning to look over your shoulders with one hand support on counter as needed.  Start with head motions only to look in front of shoulder, then even with shoulder and progress to looking behind you. To look to side, move head /eyes, then shoulder on side looking pulls back, shift more weight to side looking and pull hip  back. Place one hand in middle of sink and let go with other hand so your shoulder can pull back. Switch hands to look other way.   Then hover one hand and look over shoulder. If looking right, use left hand at sink. If looking left, use right hand at sink. 6.  Stepping with leg that is not amputated:  Move items under cabinet out of your way. Shift your hips/pelvis so weight on prosthesis. Tighten muscles in hip on prosthetic side.  SLOWLY step other leg so front of foot is in cabinet. Then step back to floor.   Pt also recommending sitting at edge of w/c with feet supported on floor but no back support each hour in w/c until back fatigues. PT also demo & instructed in sitting on 24" stool. Pt & wife verbalized understanding.     ASSESSMENT:   CLINICAL IMPRESSION: Patient received new prosthetic socket with different suspension system which seems to have significantly improved his residual limb pain issues.  PT instructed in donning new system which he appears to understand.  He is progressing with his mobility with prosthesis but still has high fall risk and limited gait. He requires BUE support on RW for limited gait and has potential. He continues to benefit from skilled PT.     OBJECTIVE IMPAIRMENTS Abnormal gait, cardiopulmonary status limiting activity, decreased activity tolerance, decreased balance, decreased endurance, decreased knowledge of condition, decreased knowledge of use of DME, decreased mobility, difficulty walking, decreased ROM, decreased strength, increased edema, impaired flexibility, postural dysfunction, and prosthetic dependency .    ACTIVITY LIMITATIONS carrying, lifting, bending, sitting, standing, squatting, stairs, transfers, locomotion level, and prosthesis use   PARTICIPATION LIMITATIONS: driving, community activity, occupation, yard work, and recreational of camping   [place], Past/current experiences, Time since onset of  injury/illness/exacerbation, and 3+ comorbidities: see PMH  are also affecting patient's functional outcome.    REHAB POTENTIAL: Good   CLINICAL DECISION MAKING: Evolving/moderate complexity   EVALUATION COMPLEXITY: Moderate     GOALS: Goals reviewed with patient? Yes   SHORT TERM GOALS: Target date: 09/23/2022   Patient donnes new prosthesis modified independent & verbalizes adjusting ply socks.  Goal status: Set 08/24/2022 2.  Patient tolerates prosthesis >90% awake hrs /day without increase in skin issues & no limb pain. Goal status: Set 08/24/2022   3.  Patient able to stand 2 min without UE support with supervision. Goal status: Set 08/24/2022   4. Patient ambulates 12' with cane & prosthesis with minA. Goal status: Set 08/24/2022     LONG TERM GOALS: Target date: 08/27/2022   Patient demonstrates & verbalized understanding of prosthetic care to enable safe utilization of prosthesis. Goal status: Not met 08/24/2022   Patient tolerates prosthesis wear >90% of awake hours without skin or limb pain issues. Goal status: Not met 08/24/2022   Merrilee Jansky Balance >/=  36/56 to indicate lower fall risk Goal status: Not met 08/24/2022   Patient ambulates >300' with prosthesis & LRAD independently Goal status: Not met 08/24/2022   Patient negotiates ramps, curbs & stairs with single rail with prosthesis & LRAD independently. Goal status: Not met 08/24/2022  UPDATED LONG TERM GOALS: Target date: 11/19/2022   Patient demonstrates & verbalized understanding of prosthetic care to enable safe utilization of prosthesis. Goal status: Ongoing 08/24/2022  2.   Patient tolerates prosthesis wear >90% of awake hours without skin or limb pain issues. Goal status: Ongoing 08/24/2022   3.   Berg Balance >/= 36/56 to indicate lower fall risk Goal status: Ongoing 08/24/2022   4.   Patient ambulates >300' with prosthesis & LRAD independently Goal status:Ongoing 08/24/2022   5.   Patient negotiates ramps,  curbs & stairs with single rail with prosthesis & LRAD independently. Goal status: Ongoing 08/24/2022     PLAN: PT FREQUENCY: 1x/week for 8 additional visits    PT DURATION: 12 weeks   PLANNED INTERVENTIONS: Therapeutic exercises, Therapeutic activity, Neuromuscular re-education, Balance training, Gait training, Patient/Family education, Self Care, Stair training, Vestibular training, Canalith repositioning, Prosthetic training, DME instructions, Vasopneumatic device, and physical performance testing   PLAN FOR NEXT SESSION:   assess residual limb & donning prosthesis, instruct in HEP for balance & progressing gait.  Try cane near counter or rail.    Jamey Reas, PT, DPT 08/24/2022, 5:09 PM

## 2022-08-25 DIAGNOSIS — N2581 Secondary hyperparathyroidism of renal origin: Secondary | ICD-10-CM | POA: Diagnosis not present

## 2022-08-25 DIAGNOSIS — Z992 Dependence on renal dialysis: Secondary | ICD-10-CM | POA: Diagnosis not present

## 2022-08-25 DIAGNOSIS — N186 End stage renal disease: Secondary | ICD-10-CM | POA: Diagnosis not present

## 2022-08-26 ENCOUNTER — Encounter: Payer: Self-pay | Admitting: Family Medicine

## 2022-08-26 ENCOUNTER — Ambulatory Visit (INDEPENDENT_AMBULATORY_CARE_PROVIDER_SITE_OTHER): Payer: BC Managed Care – PPO | Admitting: Family Medicine

## 2022-08-26 ENCOUNTER — Encounter (HOSPITAL_COMMUNITY): Payer: Self-pay | Admitting: Vascular Surgery

## 2022-08-26 ENCOUNTER — Encounter: Payer: BC Managed Care – PPO | Admitting: Physical Therapy

## 2022-08-26 ENCOUNTER — Other Ambulatory Visit: Payer: Self-pay

## 2022-08-26 VITALS — BP 110/70 | Temp 97.6°F | Ht 70.0 in

## 2022-08-26 DIAGNOSIS — I4891 Unspecified atrial fibrillation: Secondary | ICD-10-CM | POA: Diagnosis not present

## 2022-08-26 DIAGNOSIS — Z23 Encounter for immunization: Secondary | ICD-10-CM

## 2022-08-26 DIAGNOSIS — Z992 Dependence on renal dialysis: Secondary | ICD-10-CM

## 2022-08-26 DIAGNOSIS — F329 Major depressive disorder, single episode, unspecified: Secondary | ICD-10-CM | POA: Diagnosis not present

## 2022-08-26 DIAGNOSIS — Z7901 Long term (current) use of anticoagulants: Secondary | ICD-10-CM | POA: Diagnosis not present

## 2022-08-26 DIAGNOSIS — B379 Candidiasis, unspecified: Secondary | ICD-10-CM | POA: Diagnosis not present

## 2022-08-26 DIAGNOSIS — N390 Urinary tract infection, site not specified: Secondary | ICD-10-CM | POA: Diagnosis not present

## 2022-08-26 NOTE — Progress Notes (Signed)
S.D.W- Instructions   Your procedure is scheduled on Fri., Oct. 6, 2023 from 10:24AM-11:32AM.  Report to [facility] Main Entrance "A" at 7:50 A.M., then check in with the Admitting office.  Call this number if you have problems the morning of surgery:  [phone]   Remember:  Do not eat or drink after midnight on Oct. 5th    Take these medicines the morning of surgery with A SIP OF WATER:  Allopurinol (ZYLOPRIM) Gabapentin (NEURONTIN) Pantoprazole (PROTONIX)  If Needed: Acetaminophen (TYLENOL)  As of today, STOP taking any Aspirin (unless otherwise instructed by your surgeon) Aleve, Naproxen, Ibuprofen, Motrin, Advil, Goody's, BC's, all herbal medications, fish oil, and all vitamins.   Do not wear jewelry. Do not wear lotions, powders, cologne or deodorant. Do not shave 48 hours prior to surgery.  Men may shave face and neck. Do not bring valuables to the hospital.  [facility] is not responsible for any belongings or valuables.    Do NOT Smoke (Tobacco/Vaping)  24 hours prior to your procedure  If you use a CPAP at night, you may bring your mask for your overnight stay.   Contacts, glasses, hearing aids, dentures or partials may not be worn into surgery, please bring cases for these belongings   For patients admitted to the hospital, discharge time will be determined by your treatment team.   Patients discharged the day of surgery will not be allowed to drive home, and someone needs to stay with them for 24 hours.  Special instructions:    Oral Hygiene is also important to reduce your risk of infection.  Remember - BRUSH YOUR TEETH THE MORNING OF SURGERY WITH YOUR REGULAR TOOTHPASTE  [place]- Preparing For Surgery  Before surgery, you can play an important role. Because skin is not sterile, your skin needs to be as free of germs as possible. You can reduce the number of germs on your skin by washing with Antibacterial Soap before surgery.     Please follow these  instructions carefully.     Shower the NIGHT BEFORE SURGERY and the MORNING OF SURGERY with Antibacterial Soap.   Pat yourself dry with a CLEAN TOWEL.  Wear CLEAN PAJAMAS to bed the night before surgery  Place CLEAN SHEETS on your bed the night before your surgery  DO NOT SLEEP WITH PETS.  Day of Surgery:  Take a shower with Antibacterial soap. Wear Clean/Comfortable clothing the morning of surgery Do not apply any deodorants/lotions.   Remember to brush your teeth WITH YOUR REGULAR TOOTHPASTE.   If you test positive for Covid, or been in contact with anyone that has tested positive in the last 10 days, please notify your surgeon.  SURGICAL WAITING ROOM VISITATION Patients having surgery or a procedure may have no more than 2 support people in the waiting area - these visitors may rotate.   Children under the age of 16 must have an adult with them who is not the patient. If the patient needs to stay at the hospital during part of their recovery, the visitor guidelines for inpatient rooms apply. Pre-op nurse will coordinate an appropriate time for 1 support person to accompany patient in pre-op.  This support person may not rotate.   Please refer to the [facility] website for the visitor guidelines for Inpatients (after your surgery is over and you are in a regular room).

## 2022-08-26 NOTE — Progress Notes (Signed)
PCP - PA Enrigue Catena  Cardiologist - Dr. Audie Box  EP- Denies  Endocrine- Denies  Pulm- Denies  Chest x-ray - 06/09/22 (E)  EKG - 08/15/22 (E)  Stress Test - Denies  ECHO - Denies  Cardiac Cath - Denies  AICD-na PM-na LOOP-na  Nerve Stimulator- Denies  Dialysis- T-TH-Sa  Sleep Study - Denies CPAP - Denies  LABS- 08/28/22: I-Stat 8  ASA- Denies  ERAS- No  HA1C- 02/23/22(E): 5.5  The pt is requesting to have a dialysis treatment on Saturday prior to being discharged.He normally goes on T-T-S from 11a-4p.  Anesthesia- Yes- medical history  Pt denies having chest pain, sob, or fever during the pre-op phone call. All instructions explained to the pt, with a verbal understanding of the material.Pt also instructed to wear a mask and social distance if he goes out. The opportunity to ask questions was provided.

## 2022-08-26 NOTE — Progress Notes (Signed)
Subjective:     Patient ID: Jeffrey Ayala, male    DOB: 1961-08-12, 61 y.o.   MRN: [id number]  Chief Complaint  Patient presents with   Follow-up    3 month f/u     HPI Patient is in today for 3 month follow up   Hematology- Dr. Alen Blew Cardiology- Dr. Jenetta Downer' [facility]i [facility] Nephrology- [facility] in [place]  Urology-   Depression- at his previous visit, we discussed seeing a psychiatrist or counselor and starting on Lexapro. States he did not start on the medication and declines to take an antidepressant or see a therapist. His wife reports his mood has improved.   He is able to walk using a prosthetic now.  Working from home which he likes.   States he feels very tired with dialysis   Appt for fistula Friday  His wife will manage his dialysis at home once this is done.   Recent kidney stone with stent placement.   Upcoming colonoscopy with Dr. Havery Moros.  Recent work up for liver disease.   Denies fever, chills, dizziness, chest pain, palpitations, shortness of breath, abdominal pain, N/V/D, LE edema.    He has several specialists caring for him including GI, orthopedics, nephrology, urology and cardiology since recent hospitalization.     Hospitalized from 02/22/2022-04/15/2022 and 5/27-5/30/2023 for sepsis related to scrotal abscess and new diagnosis of cirrhosis. He also was diagnosed with atrial fibrillation while hospitalized. He was on Eliquis and switched to warfarin. Cardiologist is managing anticoagulant.    2 amputations during hospitalization including right BKA and left foot.   Health Maintenance Due  Topic Date Due   COLONOSCOPY (Pts 45-90yr Insurance coverage will need to be confirmed)  Never done   Zoster Vaccines- Shingrix (1 of 2) Never done    Past Medical History:  Diagnosis Date   Anemia    Atrial [facility], chronic ([name])    [facility]  Cardiac arrest [place] 02/2022   in the setting of septic shock   Cirrhosis of liver ([name])     Concussion    Eosinophilic esophagitis    EGD/bx by Dr. AHavery Moros  ESRD needing [facility] ([facility]    GERD (gastroesophageal reflux disease)    History of gastric ulcer    History of kidney stones    Intermittent dysphagia    Migraine    Scrotal infection 02/2022   [place]   Vasoplegia - > L TMA and R BKA    Past Surgical History:  Procedure Laterality Date   AMPUTATION Right 03/15/2022   Procedure: RIGHT BELOW KNEE AMPUTATION;  Surgeon: DNewt Minion MD;  [place]  Service: Orthopedics;  Laterality: Right;   AV FISTULA PLACEMENT Left 08/28/2022   Procedure: LEFT ARM ARTERIOVENOUS (AV) FISTULA CREATION;  Surgeon: CWaynetta Sandy MD;  [place]  Service: Vascular;  Laterality: Left;   CYSTOSCOPY     CYSTOSCOPY WITH STENT PLACEMENT Bilateral 08/15/2022   Procedure: CYSTOSCOPY WITH STENT PLACEMENT;  Surgeon: NRemi Haggard MD;  [place]  Service: Urology;  Laterality: Bilateral;   DENTAL SURGERY     FINGER SURGERY     HERNIA REPAIR     at age 61  IR FLUORO GUIDE CV LINE RIGHT  03/09/2022   IR PATIENT EVAL TECH 0-60 MINS  03/09/2022   IR [place] VASC ACCESS RIGHT  03/09/2022   KNEE ARTHROSCOPY Left    LITHOTRIPSY  NEPHROLITHOTOMY     SCROTAL EXPLORATION N/A 03/16/2022   Procedure: IRRIGATION AND DEBRIDEMENT SCROTUM;  Surgeon: Raynelle Bring, MD;  Location: [place];  Service: Urology;  Laterality: N/A;   SCROTAL EXPLORATION N/A 03/28/2022   Procedure: IRRIGATION AND DEBRIDEMENT SCROTUM;  Surgeon: Ardis Hughs, MD;  Location: [place];  Service: Urology;  Laterality: N/A;   TONSILLECTOMY     TOTAL KNEE ARTHROPLASTY Bilateral 11/04/2018   Procedure: BILATERAL TOTAL KNEE ARTHROPLASTY;  Surgeon: [name], MD;  Location: [place];  Service: Orthopedics;  Laterality: Bilateral;  spinal/epidural per anesthesiologist   TRANSMETATARSAL AMPUTATION Left 03/15/2022   Procedure: LEFT TRANSMETATARSAL AMPUTATION;  Surgeon: [name],  MD;  Location[place];  Service: Orthopedics;  Laterality: Left;    Family History  Problem Relation Age of Onset   Arrhythmia Mother    COPD Mother    Sarcoidosis Brother    Cancer Brother    Stomach cancer Neg Hx    Pancreatic cancer Neg Hx    Esophageal cancer Neg Hx    Colon cancer Neg Hx    Rectal cancer Neg Hx     Social History   Socioeconomic History   Marital status: Married    Spouse name: Not on file   Number of children: 2   Years of education: Not on file   Highest education level: Not on file  Occupational History   Occupation: Press photographer - Clinical cytogeneticist  Tobacco Use   Smoking status: Never   Smokeless tobacco: Never  Vaping Use   Vaping Use: Never used  Substance and Sexual Activity   Alcohol use: Not Currently    Comment: occassional   Drug use: No   Sexual activity: Not Currently  Other Topics Concern   Not on file  Social History Narrative   Not on file   Social Determinants of Health   Financial Resource Strain: Not on file  Food Insecurity: No Food Insecurity (08/19/2022)   Hunger Vital Sign    Worried About Running Out of Food in the Last Year: Never true    Ran Out of Food in the Last Year: Never true  Transportation Needs: No Transportation Needs (08/19/2022)   PRAPARE - Hydrologist (Medical): No    Lack of Transportation (Non-Medical): No  Physical Activity: Not on file  Stress: Not on file  Social Connections: Not on file  Intimate Partner Violence: Not on file    Outpatient Medications Prior to Visit  Medication Sig Dispense Refill   acetaminophen (TYLENOL) 650 MG CR tablet Take 650 mg by mouth every 8 (eight) hours as needed for pain or fever.     allopurinol (ZYLOPRIM) 100 MG tablet Take 1 tablet (100 mg total) by mouth daily. 30 tablet 3   gabapentin (NEURONTIN) 100 MG capsule Take 100 mg by mouth 3 (three) times daily.     multivitamin (RENA-VIT) TABS tablet Take 1 tablet by mouth at bedtime. 30  tablet 0   pantoprazole (PROTONIX) 40 MG tablet Take 1 tablet (40 mg total) by mouth 2 (two) times daily. (Patient taking differently: Take 40 mg by mouth daily.) 180 tablet 3   PEG-KCl-NaCl-NaSulf-Na Asc-C (PLENVU) 140 g SOLR Use as directed 1 each 0   tolterodine (DETROL LA) 4 MG 24 hr capsule Take 1 capsule (4 mg total) by mouth daily. 30 capsule 0   traZODone (DESYREL) 50 MG tablet TAKE 1/2 OR 1 TABLET BY MOUTH AT BEDTIME AS NEEDED FOR SLEEP (  Patient taking differently: Take 25-50 mg by mouth at bedtime as needed for sleep.) 90 tablet 1   No facility-administered medications prior to visit.    Allergies  Allergen Reactions   Codeine Hives    Insomnia, jittery   Flomax [Tamsulosin Hcl] Nausea Only    Nausea and fatigue    ROS     Objective:    Physical Exam Constitutional:      General: He is not in acute distress.    Appearance: He is not ill-appearing.  Eyes:     Conjunctiva/sclera: Conjunctivae normal.  Cardiovascular:     Rate and Rhythm: Normal rate. Rhythm irregular.  Pulmonary:     Effort: Pulmonary effort is normal.     Breath sounds: Normal breath sounds.  Skin:    General: Skin is warm and dry.  Neurological:     General: No focal deficit present.     Mental Status: He is alert and oriented to person, place, and time.  Psychiatric:        Mood and Affect: Mood normal.        Behavior: Behavior normal.        Thought Content: Thought content normal.     BP 110/70 (BP Location: Left Arm, Patient Position: Sitting, Cuff Size: Large)   Temp 97.6 F (36.4 C) (Temporal)   Ht '5\' 10"'$  (1.778 m)   BMI 30.62 kg/m  Wt Readings from Last 3 Encounters:  08/28/22 200 lb (90.7 kg)  08/15/22 213 lb 6.5 oz (96.8 kg)  08/12/22 196 lb (88.9 kg)       Assessment & Plan:   Problem List Items Addressed This Visit       Cardiovascular and Mediastinum   Atrial fibrillation ([place])     Other   Dependence on renal dialysis ([place])   Long term (current) use of  anticoagulants   Reactive depression - Primary   Other Visit Diagnoses     Need for influenza vaccination       Relevant Orders   Flu Vaccine QUAD 65moIM (Fluarix, Fluzone & Alfiuria Quad PF) (Completed)      Declines medication or therapy for depression. Reports mood has improved since being able to work from home and use prosthetic. His wife also reports improvement in his mood overall.  He is under the care of several specialists. Upcoming appt for fistula. He is looking forward to being able to do HD at home in the near future.  Cardiologist managing anticoagulant for A-fib however, it is currently being held due to low hemoglobin.  Reviewed notes from hematologist, cardiologist and GI.  Follow up here in 6 months.     I am having [employer] maintain his acetaminophen, multivitamin, allopurinol, pantoprazole, traZODone, Plenvu, tolterodine, and gabapentin.  No orders of the defined types were placed in this encounter.

## 2022-08-26 NOTE — Patient Instructions (Signed)
Let me know if you need to come in sooner, otherwise, I will see you back in 6 months.

## 2022-08-27 ENCOUNTER — Other Ambulatory Visit: Payer: Self-pay | Admitting: Urology

## 2022-08-27 ENCOUNTER — Telehealth: Payer: Self-pay | Admitting: Cardiovascular Disease

## 2022-08-27 DIAGNOSIS — N186 End stage renal disease: Secondary | ICD-10-CM | POA: Diagnosis not present

## 2022-08-27 DIAGNOSIS — N2581 Secondary hyperparathyroidism of renal origin: Secondary | ICD-10-CM | POA: Diagnosis not present

## 2022-08-27 DIAGNOSIS — Z992 Dependence on renal dialysis: Secondary | ICD-10-CM | POA: Diagnosis not present

## 2022-08-27 NOTE — Telephone Encounter (Signed)
Preop team, patient has upcoming appointment with Dr. Audie Box on 09/11/2022.  Patient surgery is on 09/14/2022.  I will defer preoperative cardiac evaluation to Dr. Audie Box at the time of his visit.  Please add preoperative cardiac evaluation to his visit note.  Thank you for your help.  Jossie Ng. Jayla Mackie NP-C     08/27/2022, 3:45 PM [address] Office [phone] Fax [phone]

## 2022-08-27 NOTE — Anesthesia Preprocedure Evaluation (Addendum)
Anesthesia Evaluation  Patient identified by MRN, date of birth, ID band Patient awake    Reviewed: Allergy & Precautions, H&P , NPO status , Patient's Chart, lab work & pertinent test results  Airway Mallampati: III  TM Distance: >3 FB Neck ROM: Full    Dental no notable dental hx. (+) Teeth Intact, Dental Advisory Given   Pulmonary neg pulmonary ROS,    Pulmonary exam normal breath sounds clear to auscultation       Cardiovascular hypertension, + Past MI   Rhythm:Regular Rate:Normal     Neuro/Psych  Headaches, Depression    GI/Hepatic Neg liver ROS, PUD, GERD  Medicated,  Endo/Other  negative endocrine ROSHypothyroidism   Renal/GU ESRF and DialysisRenal disease  negative genitourinary   Musculoskeletal  (+) Arthritis , Osteoarthritis,    Abdominal   Peds  Hematology  (+) Blood dyscrasia, anemia ,   Anesthesia Other Findings   Reproductive/Obstetrics negative OB ROS                            Anesthesia Physical Anesthesia Plan  ASA: 3  Anesthesia Plan: MAC and Regional   Post-op Pain Management: Tylenol PO (pre-op)*   Induction: Intravenous  PONV Risk Score and Plan: 1 and Propofol infusion and Midazolam  Airway Management Planned: Natural Airway and Simple Face Mask  Additional Equipment:   Intra-op Plan:   Post-operative Plan:   Informed Consent: I have reviewed the patients History and Physical, chart, labs and discussed the procedure including the risks, benefits and alternatives for the proposed anesthesia with the patient or authorized representative who has indicated his/her understanding and acceptance.     Dental advisory given  Plan Discussed with: CRNA  Anesthesia Plan Comments: (PAT note written 08/27/2022 by Myra Gianotti, PA-C. )       Anesthesia Quick Evaluation

## 2022-08-27 NOTE — Telephone Encounter (Signed)
   Pre-operative Risk Assessment    Patient Name: Jeffrey Ayala  DOB: 03/30/1961 MRN: [id number]      Request for Surgical Clearance    Procedure:   Cystoscopy Bilateral Ureteroscopy Laser Lithotripsy and bilateral stent placement   Date of Surgery:  Clearance 09/14/22                                 Surgeon:  Dr. Raynelle Bring Surgeon's Group or Practice Name:  [facility]  Phone number:  [phone] Fax number:  [phone]   Type of Clearance Requested:   - Medical    Type of Anesthesia:  General    Additional requests/questions:    Signed, April Henson   08/27/2022, 3:35 PM

## 2022-08-27 NOTE — Progress Notes (Signed)
Anesthesia Chart Review: Jeffrey Ayala  Case: [id number] Date/Time: 08/28/22 1009   Procedure: LEFT ARM ARTERIOVENOUS (AV) FISTULA CREATION VERSUS ARTERIOVENOUS GRAFT (Left)   Anesthesia type: Choice   Pre-op diagnosis: End Stage Renal Disease   Location: [facility] OR ROOM 12 / [facility]   Surgeons: Waynetta Sandy, MD       DISCUSSION: Patient is a 61 year old male scheduled for the above procedure.  He is also scheduled for EGD and colonoscopy on 09/07/2022 with Dr. Havery Moros.  History includes never smoker, atrial fibrillation (diagnosed 02/22/22 in setting of sepsis), septic shock (02/2022, likely related to scrotal infection), cirrhosis (diagnosed 02/2022; with splenomegaly), eosinophilic esophagitis, GERD, osteoarthritis (s/p bilateral TKA 11/04/18), ESRD (dialysis initiated 02/2022), anemia, amputations (left TMA and right BKA 03/15/22 from gangrene in setting of septic shock).  [facility] admission 02/22/22-04/03/22 for septic shock. He presented with 2 days history of feeling generally unwell with a somewhat rapid progression on day of admission with complaints of SOB, N/V/D and pain in his groin and scrotal region. EKG revealed afib with RVR. Tmax 102.9. CXR/Chest CT without clear source of infection. CT abd/pelvis showed diarrhea in the colon and scrotal swelling but no abscess. Troponin [id number]. He required pressors for hypotension. Cardiology and urology consulted. Elevated troponins felt related to demand ischemia. Urology did not feel acute surgical intervention was warranted. He was admitted to the ICU for sepsis of unclear source and treated with pressors, IVF, antibiotics, BiPAP. On 02/23/22 he required intubation due to AMS and persistent hypotension. Of note, he became pulseless a few minutes after intubation and resuscitated with epinephrine and bicarbonate. Echo with normal EF, no RWMA.  He required high-dose Levophed, epinephrine and vasopressin.  [name] given in an effort to  correct vasoplegia.  Due to septic shock his renal function worsened and required CRRT starting 02/26/22-03/03/22 and then transitioned to hemodialysis. He required a total of 9 units PRBC for anemia and 1 units of platelets. CT imaging with incidental finding of  liver cirrhosis with splenomegaly. He was extubated 03/03/22. He developed ischemic changes to both feet, likely related to septic shock. Gangrenous changes did no improve. Vascular and orthopedic surgery consulted. His scrotum also developed dry gangrene. He underwent left TMA and right BKA on 03/15/2022 and debridement of scrotum on 03/16/22. Scrotal wound culture with E. Faecalis and MDR Pseudomonas aeruginosa. ID recommended transition to Ampicillin, Avycaz and Flagyl then to Augment and Zerbaxa on 03/28/22. He was not discharged on anticoagulation due to anemia. S/p I&D of scrotum with wound closure of 03/28/22. Discharged to CIR 04/03/22-04/15/22.   [facility] admission 04/18/22-04/20/22 for severe headache during hemodialysis and concern their may be clot in his TDC. He was found to be in rapid afib and was admitted for rate control and anticoagulation initiation. Discharged on Eliquis and metoprolol. He tolerated HD during admission.    [facility] admission 08/15/22-08/16/22 for bilateral nephrolithiasis.  He underwent cystoscopy with bilateral double-J stent placement on 08/15/2022.  Dr. Alen Blew recommended erythropoietin administration with hemodialysis and as needed PRBC transfusions for anemia.  Right subclavian TDC used for hemodialysis access.  Noted plan for aVF on 08/28/2022.  Last cardiology follow-up was on 07/03/22 with Dr. Audie Box.  Given persistent issues with anemia decision made to hold warfarin (did not tolerate Eliquis).  He noted plans for endoscopy in the near future.  A-fib rates were still in the 100s, so metoprolol continued despite borderline low BP (SBP ~ 100s).  He noted plans for AV fistula  creation once he recovered from physical  therapy.  Follow-up in 2 months planned, on 09/11/22.  He is a same-day work-up, but has undergone multiple procedures requiring anesthesia in the last few months as outlined above. Anesthesia team to evaluate on the day of surgery.  He will get labs on arrival.   VS: Ht '5\' 10"'$  (1.778 m)   Wt 88.5 kg   BMI 27.98 kg/m  BP Readings from Last 3 Encounters:  08/26/22 110/70  08/16/22 104/60  08/12/22 110/70   Pulse Readings from Last 3 Encounters:  08/16/22 (!) 104  08/12/22 79  08/05/22 98     PROVIDERS: Henson, Vickie L, NP-C is PCP. Last visit 08/27/22, note is pending. Eleonore Chiquito, MD is cardiologist.  Last visit 07/03/22.  Zola Button, MD is HEM (anemia). Evaluated 07/10/22.  Donato Heinz, MD is nephrologist  Cellar, MD is GI   LABS: Most recent results in [facility] include: Lab Results  Component Value Date   WBC 8.9 08/16/2022   HGB 7.6 (L) 08/16/2022   HCT 21.8 (L) 08/16/2022   PLT 165 08/16/2022   GLUCOSE 139 (H) 08/16/2022   TRIG 159 (H) 03/17/2022   ALT 16 08/15/2022   AST 21 08/15/2022   NA 137 08/16/2022   K 4.0 08/16/2022   CL 101 08/16/2022   CREATININE 2.32 (H) 08/16/2022   BUN 13 08/16/2022   CO2 24 08/16/2022   TSH 0.687 02/22/2022   INR 2.7 06/15/2022   HGBA1C 5.5 02/23/2022    IMAGES: CT Abd/pelvis 08/15/22: IMPRESSION: 1. Bilateral UPJ calculi (9 mm on the RIGHT and 8 mm on the LEFT) causing moderate to severe bilateral hydronephrosis. 2. Nonobstructing bilateral renal calculi. A 5 mm calculus is present within the LEFT renal pelvis. 3. Equivocal mild contour irregularity of the liver which may represent cirrhosis. 4. Splenomegaly. 5. Aortic Atherosclerosis (ICD10-I70.0).  CXR 06/09/22: FINDINGS: Frontal and lateral views of the chest demonstrates stable right dialysis catheter. Cardiac silhouette is enlarged but unchanged. No airspace disease, effusion, or pneumothorax. No acute bony abnormalities. IMPRESSION: 1. Stable  chest, no acute process.    EKG:  08/27/22: Atrial fibrillation at 94 bpm Cannot rule out Anterior infarct , age undetermined Abnormal ECG When compared with ECG of 18-Apr-2022 14:42, PREVIOUS ECG IS PRESENT Confirmed by Nanda Quinton [phone]) on 08/26/2022 8:28:01 PM   CV: Echo 02/23/22: IMPRESSIONS   1. Left ventricular ejection fraction, by estimation, is 60 to 65%. The  left ventricle has normal function. The left ventricle has no regional  wall motion abnormalities. There is mild left ventricular hypertrophy.  Left ventricular diastolic function  could not be evaluated.   2. Right ventricular systolic function is normal. The right ventricular  size is normal.   3. The mitral valve is normal in structure. No evidence of mitral valve  regurgitation. No evidence of mitral stenosis.   4. The aortic valve is tricuspid. Aortic valve regurgitation is not  visualized. No aortic stenosis is present.   5. The inferior vena cava is dilated in size with <50% respiratory  variability, suggesting right atrial pressure of 15 mmHg.  - Comparison(s): No prior Echocardiogram.    Past Medical History:  Diagnosis Date   Anemia    Atrial fibrillation, chronic ([name])    not on [facility]   Cardiac arrest ([place]) 02/2022   in the setting of septic shock   Cirrhosis of liver ([name])    Concussion    Eosinophilic esophagitis    EGD/bx by Dr. Havery Moros  ESRD needing dialysis ([place])    GERD (gastroesophageal reflux disease)    History of gastric ulcer    History of kidney stones    Intermittent dysphagia    Migraine    Scrotal infection 02/2022   Septic shock ([place]) 02/2022   Vasoplegia - > L TMA and R BKA    Past Surgical History:  Procedure Laterality Date   AMPUTATION Right 03/15/2022   Procedure: RIGHT BELOW KNEE AMPUTATION;  Surgeon: [name], MD;  Location: [facility];  Service: Orthopedics;  Laterality: Right;   CYSTOSCOPY     CYSTOSCOPY WITH STENT PLACEMENT Bilateral 08/15/2022   Procedure:  CYSTOSCOPY WITH STENT PLACEMENT;  Surgeon: Remi Haggard, MD;  Location: [facility];  Service: Urology;  Laterality: Bilateral;   DENTAL SURGERY     FINGER SURGERY     HERNIA REPAIR     at age 44   IR FLUORO GUIDE CV LINE RIGHT  03/09/2022   IR PATIENT EVAL TECH 0-60 MINS  03/09/2022   IR US GUIDE VASC ACCESS RIGHT  03/09/2022   KNEE ARTHROSCOPY Left    LITHOTRIPSY     NEPHROLITHOTOMY     SCROTAL EXPLORATION N/A 03/16/2022   Procedure: IRRIGATION AND DEBRIDEMENT SCROTUM;  Surgeon: Raynelle Bring, MD;  Location: [place];  Service: Urology;  Laterality: N/A;   SCROTAL EXPLORATION N/A 03/28/2022   Procedure: IRRIGATION AND DEBRIDEMENT SCROTUM;  Surgeon: Ardis Hughs, MD;  Location: [facility];  Service: Urology;  Laterality: N/A;   TONSILLECTOMY     TOTAL KNEE ARTHROPLASTY Bilateral 11/04/2018   Procedure: BILATERAL TOTAL KNEE ARTHROPLASTY;  Surgeon: [name], MD;  Location: [facility];  Service: Orthopedics;  Laterality: Bilateral;  spinal/epidural per anesthesiologist   TRANSMETATARSAL AMPUTATION Left 03/15/2022   Procedure: LEFT TRANSMETATARSAL AMPUTATION;  Surgeon: [name], MD;  Location: [place];  Service: Orthopedics;  Laterality: Left;    MEDICATIONS: No current facility-administered medications for this encounter.    acetaminophen (TYLENOL) 650 MG CR tablet   allopurinol (ZYLOPRIM) 100 MG tablet   gabapentin (NEURONTIN) 100 MG capsule   multivitamin (RENA-VIT) TABS tablet   pantoprazole (PROTONIX) 40 MG tablet   PEG-KCl-NaCl-NaSulf-Na Asc-C (PLENVU) 140 g SOLR   tolterodine (DETROL LA) 4 MG 24 hr capsule   traZODone (DESYREL) 50 MG tablet    Myra Gianotti, PA-C Surgical Short Stay/Anesthesiology [facility] Phone [phone] [facility] Phone [phone] 08/27/2022 12:34 PM

## 2022-08-28 ENCOUNTER — Ambulatory Visit (HOSPITAL_COMMUNITY)
Admission: RE | Admit: 2022-08-28 | Discharge: 2022-08-28 | Disposition: A | Discharge status: Home / Self Care | Payer: BC Managed Care – PPO | Attending: Vascular Surgery | Admitting: Vascular Surgery

## 2022-08-28 ENCOUNTER — Ambulatory Visit (HOSPITAL_COMMUNITY): Payer: BC Managed Care – PPO | Admitting: Vascular Surgery

## 2022-08-28 ENCOUNTER — Encounter (HOSPITAL_COMMUNITY)
Admission: RE | Disposition: A | Discharge status: Home / Self Care | Payer: Self-pay | Source: Home / Self Care | Attending: Vascular Surgery

## 2022-08-28 ENCOUNTER — Ambulatory Visit (HOSPITAL_COMMUNITY)
Admission: RE | Admit: 2022-08-28 | Payer: BC Managed Care – PPO | Source: Ambulatory Visit | Admitting: Vascular Surgery

## 2022-08-28 ENCOUNTER — Encounter (HOSPITAL_COMMUNITY): Payer: Self-pay | Admitting: Gastroenterology

## 2022-08-28 ENCOUNTER — Other Ambulatory Visit: Payer: Self-pay

## 2022-08-28 DIAGNOSIS — F32A Depression, unspecified: Secondary | ICD-10-CM | POA: Insufficient documentation

## 2022-08-28 DIAGNOSIS — M1990 Unspecified osteoarthritis, unspecified site: Secondary | ICD-10-CM | POA: Insufficient documentation

## 2022-08-28 DIAGNOSIS — N185 Chronic kidney disease, stage 5: Secondary | ICD-10-CM

## 2022-08-28 DIAGNOSIS — I120 Hypertensive chronic kidney disease with stage 5 chronic kidney disease or end stage renal disease: Secondary | ICD-10-CM | POA: Insufficient documentation

## 2022-08-28 DIAGNOSIS — K219 Gastro-esophageal reflux disease without esophagitis: Secondary | ICD-10-CM | POA: Diagnosis not present

## 2022-08-28 DIAGNOSIS — K279 Peptic ulcer, site unspecified, unspecified as acute or chronic, without hemorrhage or perforation: Secondary | ICD-10-CM | POA: Insufficient documentation

## 2022-08-28 DIAGNOSIS — D631 Anemia in chronic kidney disease: Secondary | ICD-10-CM | POA: Insufficient documentation

## 2022-08-28 DIAGNOSIS — I252 Old myocardial infarction: Secondary | ICD-10-CM | POA: Insufficient documentation

## 2022-08-28 DIAGNOSIS — Z992 Dependence on renal dialysis: Secondary | ICD-10-CM | POA: Diagnosis not present

## 2022-08-28 DIAGNOSIS — N186 End stage renal disease: Secondary | ICD-10-CM | POA: Insufficient documentation

## 2022-08-28 DIAGNOSIS — R519 Headache, unspecified: Secondary | ICD-10-CM | POA: Insufficient documentation

## 2022-08-28 HISTORY — DX: Anemia, unspecified: D64.9

## 2022-08-28 HISTORY — PX: AV FISTULA PLACEMENT: SHX1204

## 2022-08-28 LAB — POCT I-STAT, CHEM 8
BUN: 17 mg/dL (ref 8–23)
Calcium, Ion: 0.92 mmol/L — ABNORMAL LOW (ref 1.15–1.40)
Chloride: 103 mmol/L (ref 98–111)
Creatinine, Ser: 3.1 mg/dL — ABNORMAL HIGH (ref 0.61–1.24)
Glucose, Bld: 96 mg/dL (ref 70–99)
HCT: 30 % — ABNORMAL LOW (ref 39.0–52.0)
Hemoglobin: 10.2 g/dL — ABNORMAL LOW (ref 13.0–17.0)
Potassium: 4 mmol/L (ref 3.5–5.1)
Sodium: 137 mmol/L (ref 135–145)
TCO2: 25 mmol/L (ref 22–32)

## 2022-08-28 LAB — GLUCOSE, CAPILLARY: Glucose-Capillary: 98 mg/dL (ref 70–99)

## 2022-08-28 SURGERY — ARTERIOVENOUS (AV) FISTULA CREATION
Anesthesia: Monitor Anesthesia Care | Site: Arm Upper | Laterality: Left

## 2022-08-28 MED ORDER — 0.9 % SODIUM CHLORIDE (POUR BTL) OPTIME
TOPICAL | Status: DC | PRN
  Administered 2022-08-28: 1000 mL

## 2022-08-28 MED ORDER — PHENYLEPHRINE HCL (PRESSORS) 10 MG/ML IV SOLN
INTRAVENOUS | Status: DC | PRN
  Administered 2022-08-28: 160 ug via INTRAVENOUS

## 2022-08-28 MED ORDER — STERILE WATER FOR IRRIGATION IR SOLN
Status: DC | PRN
  Administered 2022-08-28: 500 mL

## 2022-08-28 MED ORDER — ORAL CARE MOUTH RINSE: 15.0000 mL | Freq: Once | OROMUCOSAL | Status: AC

## 2022-08-28 MED ORDER — PROPOFOL 500 MG/50ML IV EMUL
INTRAVENOUS | Status: DC | PRN
  Administered 2022-08-28: 100 ug/kg/min via INTRAVENOUS

## 2022-08-28 MED ORDER — LIDOCAINE-EPINEPHRINE (PF) 1.5 %-1:200000 IJ SOLN
INTRAMUSCULAR | Status: DC | PRN
  Administered 2022-08-28: 20 mL via PERINEURAL

## 2022-08-28 MED ORDER — PHENYLEPHRINE HCL-NACL 20-0.9 MG/250ML-% IV SOLN
INTRAVENOUS | Status: DC | PRN
  Administered 2022-08-28: 10 ug/min via INTRAVENOUS

## 2022-08-28 MED ORDER — CEFAZOLIN SODIUM-DEXTROSE 2-4 GM/100ML-% IV SOLN
2.0000 g | INTRAVENOUS | Status: AC
  Administered 2022-08-28: 2 g via INTRAVENOUS
  Filled 2022-08-28: qty 100

## 2022-08-28 MED ORDER — FENTANYL CITRATE (PF) 100 MCG/2ML IJ SOLN: 25.0000 ug | INTRAMUSCULAR | Status: DC | PRN

## 2022-08-28 MED ORDER — HEPARIN 6000 UNIT IRRIGATION SOLUTION
Status: DC | PRN
  Administered 2022-08-28: 1

## 2022-08-28 MED ORDER — MIDAZOLAM HCL 2 MG/2ML IJ SOLN
INTRAMUSCULAR | Status: AC
  Administered 2022-08-28: 2 mg via INTRAVENOUS
  Filled 2022-08-28: qty 2

## 2022-08-28 MED ORDER — HEPARIN 6000 UNIT IRRIGATION SOLUTION
Status: AC
  Filled 2022-08-28: qty 500

## 2022-08-28 MED ORDER — CHLORHEXIDINE GLUCONATE 4 % EX LIQD: 60.0000 mL | Freq: Once | CUTANEOUS | Status: DC

## 2022-08-28 MED ORDER — CHLORHEXIDINE GLUCONATE 0.12 % MT SOLN
15.0000 mL | Freq: Once | OROMUCOSAL | Status: AC
  Administered 2022-08-28: 15 mL via OROMUCOSAL
  Filled 2022-08-28: qty 15

## 2022-08-28 MED ORDER — MIDAZOLAM HCL 2 MG/2ML IJ SOLN: 2.0000 mg | Freq: Once | INTRAMUSCULAR | Status: AC

## 2022-08-28 MED ORDER — PROPOFOL 10 MG/ML IV BOLUS
INTRAVENOUS | Status: DC | PRN
  Administered 2022-08-28: 50 mg via INTRAVENOUS

## 2022-08-28 MED ORDER — ACETAMINOPHEN 500 MG PO TABS: 1000.0000 mg | ORAL_TABLET | Freq: Once | ORAL | Status: AC

## 2022-08-28 MED ORDER — ACETAMINOPHEN 500 MG PO TABS
ORAL_TABLET | ORAL | Status: AC
  Administered 2022-08-28: 1000 mg via ORAL
  Filled 2022-08-28: qty 2

## 2022-08-28 MED ORDER — SODIUM CHLORIDE 0.9 % IV SOLN: INTRAVENOUS | Status: DC

## 2022-08-28 MED ORDER — OXYCODONE-ACETAMINOPHEN 5-325 MG PO TABS: 1.0000 | ORAL_TABLET | Freq: Four times a day (QID) | ORAL | 0 refills | Status: DC | PRN

## 2022-08-28 MED ORDER — FENTANYL CITRATE (PF) 100 MCG/2ML IJ SOLN
INTRAMUSCULAR | Status: AC
  Administered 2022-08-28: 50 ug
  Filled 2022-08-28: qty 2

## 2022-08-28 MED ORDER — FENTANYL CITRATE (PF) 100 MCG/2ML IJ SOLN: 50.0000 ug | Freq: Once | INTRAMUSCULAR | Status: DC

## 2022-08-28 SURGICAL SUPPLY — 35 items
ADH SKN CLS APL DERMABOND .7 (GAUZE/BANDAGES/DRESSINGS) ×1
ARMBAND PINK RESTRICT EXTREMIT (MISCELLANEOUS) ×1 IMPLANT
BAG COUNTER SPONGE SURGICOUNT (BAG) ×1 IMPLANT
BAG SPNG CNTER NS LX DISP (BAG) ×1
CANISTER SUCT 3000ML PPV (MISCELLANEOUS) ×1 IMPLANT
CLIP LIGATING EXTRA MED SLVR (CLIP) ×1 IMPLANT
CLIP LIGATING EXTRA SM BLUE (MISCELLANEOUS) ×1 IMPLANT
COVER PROBE W GEL 5X96 (DRAPES) IMPLANT
DERMABOND ADVANCED .7 DNX12 (GAUZE/BANDAGES/DRESSINGS) ×1 IMPLANT
ELECT REM PT RETURN 9FT ADLT (ELECTROSURGICAL) ×1
ELECTRODE REM PT RTRN 9FT ADLT (ELECTROSURGICAL) ×1 IMPLANT
GLOVE BIO SURGEON STRL SZ 6.5 (GLOVE) IMPLANT
GLOVE BIO SURGEON STRL SZ7.5 (GLOVE) ×1 IMPLANT
GLOVE BIOGEL PI IND STRL 6 (GLOVE) IMPLANT
GLOVE BIOGEL PI IND STRL 7.5 (GLOVE) IMPLANT
GLOVE ECLIPSE 7.0 STRL STRAW (GLOVE) IMPLANT
GOWN STRL REUS W/ TWL LRG LVL3 (GOWN DISPOSABLE) ×2 IMPLANT
GOWN STRL REUS W/ TWL XL LVL3 (GOWN DISPOSABLE) ×1 IMPLANT
GOWN STRL REUS W/TWL LRG LVL3 (GOWN DISPOSABLE) ×2
GOWN STRL REUS W/TWL XL LVL3 (GOWN DISPOSABLE) ×1
KIT BASIN OR (CUSTOM PROCEDURE TRAY) ×1 IMPLANT
KIT TURNOVER KIT B (KITS) ×1 IMPLANT
LOOP VESSEL MINI RED (MISCELLANEOUS) IMPLANT
NS IRRIG 1000ML POUR BTL (IV SOLUTION) ×1 IMPLANT
PACK CV ACCESS (CUSTOM PROCEDURE TRAY) ×1 IMPLANT
PAD ARMBOARD 7.5X6 YLW CONV (MISCELLANEOUS) ×2 IMPLANT
SLING ARM FOAM STRAP LRG (SOFTGOODS) IMPLANT
SLING ARM FOAM STRAP MED (SOFTGOODS) IMPLANT
SUT MNCRL AB 4-0 PS2 18 (SUTURE) ×1 IMPLANT
SUT PROLENE 6 0 BV (SUTURE) ×1 IMPLANT
SUT VIC AB 3-0 SH 27 (SUTURE) ×1
SUT VIC AB 3-0 SH 27X BRD (SUTURE) ×1 IMPLANT
TOWEL GREEN STERILE (TOWEL DISPOSABLE) ×1 IMPLANT
UNDERPAD 30X36 HEAVY ABSORB (UNDERPADS AND DIAPERS) ×1 IMPLANT
WATER STERILE IRR 1000ML POUR (IV SOLUTION) ×1 IMPLANT

## 2022-08-28 NOTE — Op Note (Signed)
Patient name: Jeffrey Ayala MRN: [id number] DOB: Jul 03, 1961 Sex: male  08/28/2022 Pre-operative Diagnosis: esrd Post-operative diagnosis:  Same Surgeon:  Apolinar Junes C. Randie Heinz, MD Assistant: Derwood Kaplan, MS3 Procedure Performed:  Left arm brachial artery to cephalic vein AV fistula creation   Indications: 61 year old male with end-stage renal disease dialyzing via catheter.  He is indicated for permanent access appears to have suitable basilic and cephalic veins on the left for fistula creation.  Findings: The cephalic vein was divided from median cubital vein.  There is still a basilic vein that could be used for fistula in the future.  At completion there was a very strong thrill in the cephalic vein and a palpable radial artery pulse the wrist confirmed with Doppler.   Procedure:  The patient was identified in the holding area and taken to the operating room where is placed upon operative table and MAC anesthesia was induced.  He was sterilely prepped and draped in the left upper extremity usual fashion, antibiotics were minister timeout was called.  A preoperative block of been placed this was tested.  Ultrasound used to identify a suitable cephalic and basilic vein.  A transverse incision was created.  We dissected the cephalic vein free back to the skin joining area with the basilic vein.  We dissected the deep fascia the brachial artery placed a vessel loop around this.  This vein was then marked for orientation clamped at its joining with the basilic vein and transected and tied off.  It was then spatulated flushed with saline and clamped.  The artery is clamped distally proximally opened longitudinally flushed with heparinized saline and distal direction only.  The vein was then sewn into side with 6-0 Prolene suture.  Prior completion without flushing all directions.  1 ablation we freed up some soft tissue around the vein there was a very strong thrill within the vein confirmed with  Doppler and a palpable radial artery pulse at the wrist confirmed with Doppler.  We then irrigated the wound obtaining stasis and closed in layers of Vicryl and Monocryl.  Dermabond is placed at the skin level.  He was awakened from anesthesia having tolerated procedure without any complication.  Counts were correct at completion.  EBL: 25cc   Yamilet Mcfayden C. Randie Heinz, MD [facility] Office: [phone] Pager: [phone]

## 2022-08-28 NOTE — Anesthesia Procedure Notes (Signed)
Anesthesia Regional Block: Supraclavicular block   Pre-Anesthetic Checklist: , timeout performed,  Correct Patient, Correct Site, Correct Laterality,  Correct Procedure, Correct Position, site marked,  Risks and benefits discussed,  Pre-op evaluation,  At surgeon's request and post-op pain management  Laterality: Left  Prep: Maximum Sterile Barrier Precautions used, chloraprep       Needles:  Injection technique: Single-shot  Needle Type: Echogenic Stimulator Needle     Needle Length: 9cm  Needle Gauge: 21     Additional Needles:   Procedures:,,,, ultrasound used (permanent image in chart),,    Narrative:  Start time: 08/28/2022 9:05 AM End time: 08/28/2022 9:15 AM Injection made incrementally with aspirations every 5 mL.  Performed by: Personally  Anesthesiologist: Roderic Palau, MD

## 2022-08-28 NOTE — Discharge Instructions (Signed)
   [facility]  Discharge Instructions  AV Fistula or Graft Surgery for Dialysis Access  Please refer to the following instructions for your post-procedure care. Your surgeon or physician assistant will discuss any changes with you.  Activity  You may drive the day following your surgery, if you are comfortable and no longer taking prescription pain medication. Resume full activity as the soreness in your incision resolves.  Bathing/Showering  You may shower after you go home. Keep your incision dry for 48 hours. Do not soak in a bathtub, hot tub, or swim until the incision heals completely. You may not shower if you have a hemodialysis catheter.  Incision Care  Clean your incision with mild soap and water after 48 hours. Pat the area dry with a clean towel. You do not need a bandage unless otherwise instructed. Do not apply any ointments or creams to your incision. You may have skin glue on your incision. Do not peel it off. It will come off on its own in about one week. Your arm may swell a bit after surgery. To reduce swelling use pillows to elevate your arm so it is above your heart. Your doctor will tell you if you need to lightly wrap your arm with an ACE bandage.  Diet  Resume your normal diet. There are not special food restrictions following this procedure. In order to heal from your surgery, it is CRITICAL to get adequate nutrition. Your body requires vitamins, minerals, and protein. Vegetables are the best source of vitamins and minerals. Vegetables also provide the perfect balance of protein. Processed food has little nutritional value, so try to avoid this.  Medications  Resume taking all of your medications. If your incision is causing pain, you may take over-the counter pain relievers such as acetaminophen (Tylenol). If you were prescribed a stronger pain medication, please be aware these medications can cause nausea and constipation. Prevent  nausea by taking the medication with a snack or meal. Avoid constipation by drinking plenty of fluids and eating foods with high amount of fiber, such as fruits, vegetables, and grains.  Do not take Tylenol if you are taking prescription pain medications.  Follow up Your surgeon may want to see you in the office following your access surgery. If so, this will be arranged at the time of your surgery.  Please call us immediately for any of the following conditions:  Increased pain, redness, drainage (pus) from your incision site Fever of 101 degrees or higher Severe or worsening pain at your incision site Hand pain or numbness.  Reduce your risk of vascular disease:  Stop smoking. If you would like help, call [facility] at 1-800-QUIT-NOW 573-777-4469) or [place] at [place] your cholesterol Maintain a desired weight Control your diabetes Keep your blood pressure down  Dialysis  It will take several weeks to several months for your new dialysis access to be ready for use. Your surgeon will determine when it is okay to use it. Your nephrologist will continue to direct your dialysis. You can continue to use your Permcath until your new access is ready for use.   08/28/2022 Jeffrey Ayala [id number] 1961/03/29  Surgeon(s): Waynetta Sandy, MD  Procedure(s): Creation left brachiocephalic AV fistula  x Do not stick fistula for 12 weeks    If you have any questions, please call the office at [phone].

## 2022-08-28 NOTE — Transfer of Care (Signed)
Immediate Anesthesia Transfer of Care Note  Patient: Jeffrey Ayala  Procedure(s) Performed: LEFT ARM ARTERIOVENOUS (AV) FISTULA CREATION (Left: Arm Upper)  Patient Location: PACU  Anesthesia Type:MAC combined with regional for post-op pain  Level of Consciousness: awake, alert  and oriented  Airway & Oxygen Therapy: Patient Spontanous Breathing and Patient connected to face mask oxygen  Post-op Assessment: Report given to RN and Post -op Vital signs reviewed and stable  Post vital signs: Reviewed and stable  Last Vitals:  Vitals Value Taken Time  BP 93/60 08/28/22 1100  Temp 36.6 C 08/28/22 1054  Pulse 83 08/28/22 1102  Resp 15 08/28/22 1102  SpO2 100 % 08/28/22 1102  Vitals shown include unvalidated device data.  Last Pain:  Vitals:   08/28/22 0841  TempSrc:   PainSc: 0-No pain      Patients Stated Pain Goal: 3 (94/70/96 2836)  Complications: No notable events documented.

## 2022-08-28 NOTE — Anesthesia Postprocedure Evaluation (Signed)
Anesthesia Post Note  Patient: Jeffrey Ayala  Procedure(s) Performed: LEFT ARM ARTERIOVENOUS (AV) FISTULA CREATION (Left: Arm Upper)     Patient location during evaluation: PACU Anesthesia Type: Regional and MAC Level of consciousness: awake and alert Pain management: pain level controlled Vital Signs Assessment: post-procedure vital signs reviewed and stable Respiratory status: spontaneous breathing, nonlabored ventilation and respiratory function stable Cardiovascular status: stable and blood pressure returned to baseline Postop Assessment: no apparent nausea or vomiting Anesthetic complications: no   No notable events documented.  Last Vitals:  Vitals:   08/28/22 1130 08/28/22 1145  BP: 106/63 111/72  Pulse: 93 83  Resp: 20 14  Temp:    SpO2: 99% 99%    Last Pain:  Vitals:   08/28/22 1145  TempSrc:   PainSc: 0-No pain                 Asha Grumbine,W. EDMOND

## 2022-08-28 NOTE — Interval H&P Note (Signed)
History and Physical Interval Note:  08/28/2022 8:54 AM  Jeffrey Ayala  has presented today for surgery, with the diagnosis of End Stage Renal Disease.  The various methods of treatment have been discussed with the patient and family. After consideration of risks, benefits and other options for treatment, the patient has consented to  Procedure(s): LEFT ARM ARTERIOVENOUS (AV) FISTULA CREATION VERSUS ARTERIOVENOUS GRAFT (Left) as a surgical intervention.  The patient's history has been reviewed, patient examined, no change in status, stable for surgery.  I have reviewed the patient's chart and labs.  Questions were answered to the patient's satisfaction.     Servando Snare

## 2022-08-29 ENCOUNTER — Encounter (HOSPITAL_COMMUNITY): Payer: Self-pay | Admitting: Vascular Surgery

## 2022-08-29 DIAGNOSIS — N186 End stage renal disease: Secondary | ICD-10-CM | POA: Diagnosis not present

## 2022-08-29 DIAGNOSIS — Z992 Dependence on renal dialysis: Secondary | ICD-10-CM | POA: Diagnosis not present

## 2022-08-29 DIAGNOSIS — N2581 Secondary hyperparathyroidism of renal origin: Secondary | ICD-10-CM | POA: Diagnosis not present

## 2022-08-31 ENCOUNTER — Telehealth: Payer: Self-pay | Admitting: Gastroenterology

## 2022-08-31 ENCOUNTER — Encounter: Payer: BC Managed Care – PPO | Admitting: Physical Therapy

## 2022-08-31 LAB — HEMOCHROMATOSIS DNA-PCR(C282Y,H63D)

## 2022-08-31 LAB — HEPATITIS C ANTIBODY: Hepatitis C Ab: NONREACTIVE

## 2022-08-31 LAB — HEPATITIS A ANTIBODY, TOTAL: Hepatitis A AB,Total: NONREACTIVE

## 2022-08-31 LAB — IGG: IgG (Immunoglobin G), Serum: 1143 mg/dL (ref 600–1540)

## 2022-08-31 NOTE — Telephone Encounter (Signed)
Inbound call from patient's wife "[name]", regarding her up coming appointment on 09/07/2022, states she need to reschedule the appointment, please give her a call with a different time and day. Thanks

## 2022-08-31 NOTE — Telephone Encounter (Signed)
Returned call to [place]. Pt's EGD/Colon has been rescheduled to Monday, 10/26/22 at 7:30 am. [name] is aware that patient will need to arrive by 6 am with a care partner. [name] is aware that we will mail and send patient some updated instructions. I told [name] that we are still waiting on 1 lab results to return and we will call them to discuss results. [name] verbalized understanding and had no concerns at the end of the call.   EGD/Colon instructions mailed and sent to patient via [place].

## 2022-09-01 DIAGNOSIS — Z992 Dependence on renal dialysis: Secondary | ICD-10-CM | POA: Diagnosis not present

## 2022-09-01 DIAGNOSIS — N2581 Secondary hyperparathyroidism of renal origin: Secondary | ICD-10-CM | POA: Diagnosis not present

## 2022-09-01 DIAGNOSIS — N186 End stage renal disease: Secondary | ICD-10-CM | POA: Diagnosis not present

## 2022-09-02 ENCOUNTER — Ambulatory Visit (INDEPENDENT_AMBULATORY_CARE_PROVIDER_SITE_OTHER): Payer: BC Managed Care – PPO | Admitting: Physical Therapy

## 2022-09-02 ENCOUNTER — Other Ambulatory Visit (HOSPITAL_COMMUNITY): Payer: Self-pay

## 2022-09-02 ENCOUNTER — Encounter: Payer: Self-pay | Admitting: Physical Therapy

## 2022-09-02 DIAGNOSIS — R2689 Other abnormalities of gait and mobility: Secondary | ICD-10-CM

## 2022-09-02 DIAGNOSIS — R609 Edema, unspecified: Secondary | ICD-10-CM

## 2022-09-02 DIAGNOSIS — L98498 Non-pressure chronic ulcer of skin of other sites with other specified severity: Secondary | ICD-10-CM | POA: Diagnosis not present

## 2022-09-02 DIAGNOSIS — M21372 Foot drop, left foot: Secondary | ICD-10-CM

## 2022-09-02 DIAGNOSIS — M79661 Pain in right lower leg: Secondary | ICD-10-CM

## 2022-09-02 DIAGNOSIS — M6281 Muscle weakness (generalized): Secondary | ICD-10-CM | POA: Diagnosis not present

## 2022-09-02 DIAGNOSIS — R2681 Unsteadiness on feet: Secondary | ICD-10-CM | POA: Diagnosis not present

## 2022-09-02 NOTE — Therapy (Signed)
OUTPATIENT PHYSICAL THERAPY TREATMENT NOTE  Patient Name: Jeffrey Ayala MRN: [id number] DOB:Dec 20, 1960, 61 y.o., male Today's Date: 09/02/2022  PCP: [name], NP-C REFERRING PROVIDER: [name], MD   END OF SESSION:   PT End of Session - 09/02/22 0847     Visit Number 13    Number of Visits 20    Date for PT Re-Evaluation 11/20/22    Authorization Type [employer] comm PPO    Authorization Time Period 20 visit limit PT/OT/chiro, $35 co-pay    Authorization - Visit Number 80    Authorization - Number of Visits 20    Progress Note Due on Visit 53    PT Start Time 0845    PT Stop Time 0926    PT Time Calculation (min) 41 min    Equipment Utilized During Treatment Gait belt    Activity Tolerance Patient tolerated treatment well;Patient limited by fatigue    Behavior During Therapy [facility] for tasks assessed/performed;Flat affect                Past Medical History:  Diagnosis Date   Anemia    Atrial fibrillation, chronic ([name])    not on [facility]   Cardiac arrest ([place]) 02/2022   in the setting of septic shock   Cirrhosis of liver ([place])    Concussion    Eosinophilic esophagitis    EGD/bx by Dr. Havery Moros   ESRD needing dialysis [facility])    GERD (gastroesophageal reflux disease)    History of gastric ulcer    History of kidney stones    Intermittent dysphagia    Migraine    Scrotal infection 02/2022   Septic shock ([place]) 02/2022   Vasoplegia - > L TMA and R BKA   Past Surgical History:  Procedure Laterality Date   AMPUTATION Right 03/15/2022   Procedure: RIGHT BELOW KNEE AMPUTATION;  Surgeon: [name], MD;  Location: [place];  Service: Orthopedics;  Laterality: Right;   AV FISTULA PLACEMENT Left 08/28/2022   Procedure: LEFT ARM ARTERIOVENOUS (AV) FISTULA CREATION;  Surgeon: Waynetta Sandy, MD;  Location: [place];  Service: Vascular;  Laterality: Left;   CYSTOSCOPY     CYSTOSCOPY WITH STENT PLACEMENT Bilateral 08/15/2022   Procedure: CYSTOSCOPY WITH  STENT PLACEMENT;  Surgeon: Remi Haggard, MD;  Location: [place];  Service: Urology;  Laterality: Bilateral;   DENTAL SURGERY     FINGER SURGERY     HERNIA REPAIR     at age 3   IR FLUORO GUIDE CV LINE RIGHT  03/09/2022   IR PATIENT EVAL TECH 0-60 MINS  03/09/2022   IR US GUIDE VASC ACCESS RIGHT  03/09/2022   KNEE ARTHROSCOPY Left    LITHOTRIPSY     NEPHROLITHOTOMY     SCROTAL EXPLORATION N/A 03/16/2022   Procedure: IRRIGATION AND DEBRIDEMENT SCROTUM;  Surgeon: Raynelle Bring, MD;  Location: [place];  Service: Urology;  Laterality: N/A;   SCROTAL EXPLORATION N/A 03/28/2022   Procedure: IRRIGATION AND DEBRIDEMENT SCROTUM;  Surgeon: Ardis Hughs, MD;  Location: [place];  Service: Urology;  Laterality: N/A;   TONSILLECTOMY     TOTAL KNEE ARTHROPLASTY Bilateral 11/04/2018   Procedure: BILATERAL TOTAL KNEE ARTHROPLASTY;  Surgeon: [name], MD;  Location: [place];  Service: Orthopedics;  Laterality: Bilateral;  spinal/epidural per anesthesiologist   TRANSMETATARSAL AMPUTATION Left 03/15/2022   Procedure: LEFT TRANSMETATARSAL AMPUTATION;  Surgeon: [name], MD;  Location: [place];  Service: Orthopedics;  Laterality: Left;  Patient Active Problem List   Diagnosis Date Noted   Acute bilateral obstructive uropathy 08/15/2022   Skin breakdown 07/29/2022   Suicidal ideations 06/09/2022   Chills (without fever) 06/09/2022   Severe depression ([place]) 06/09/2022   Acute cough 06/09/2022   End stage renal disease ([place]) 06/02/2022   Reactive depression 05/31/2022   Belching 05/27/2022   Gastroesophageal reflux disease 05/27/2022   Atrial fibrillation ([place]) 05/20/2022   Long term (current) use of anticoagulants 05/20/2022   S/P transmetatarsal amputation of foot, left ([place]) 04/18/2022   CKD (chronic kidney disease) stage 5, GFR less than 15 ml/min ([place]) 04/18/2022   Secondary hyperparathyroidism of renal origin ([place]) 04/10/2022   Pruritus, unspecified 04/10/2022   Nausea 04/10/2022   Iron  deficiency anemia, unspecified 04/10/2022   Hypothyroidism, unspecified 04/10/2022   Other acute kidney failure ([place]) 04/10/2022   Dyspnea, unspecified 78/58/8502   Eosinophilic esophagitis 77/41/2878   Gastric ulcer, unspecified as acute or chronic, without hemorrhage or perforation 04/10/2022   Coagulation defect, unspecified ([place]) 04/10/2022   Allergy, unspecified, initial encounter 04/10/2022   Anaphylactic shock, unspecified, initial encounter 04/10/2022   Arthralgia of right lower leg    Acute blood loss anemia    Unilateral complete BKA, right, initial encounter ([place]) 04/03/2022   S/P BKA (below knee amputation), right ([place]) 04/03/2022   Old myocardial infarction 04/02/2022   Dependence on renal dialysis ([place]) 04/02/2022   Acquired absence of right leg below knee ([place]) 04/02/2022   Cirrhosis ([place]) 03/31/2022   Fournier's gangrene in male    Scrotal infection 03/25/2022   Normocytic anemia 03/25/2022   Diarrhea 03/25/2022   Shock liver 03/25/2022   Acute respiratory failure with hypoxia ([place]) 03/25/2022   Gout 03/25/2022   Acute nonintractable headache 03/25/2022   Demand ischemia 03/25/2022   Thrombocytopenia ([place]) 03/25/2022   Obesity, Class III, BMI 40-49.9 (morbid obesity) ([place]) 03/25/2022   Cardiac arrest ([place]) 03/25/2022   Hypotension    Respiratory insufficiency    Cellulitis    Malnutrition of moderate degree 03/12/2022   Altered mental status    Gangrene of right foot ([place])    Gangrene of left foot ([place])    Severe protein-calorie malnutrition ([place]) 03/10/2022   Encounter for central line placement    Pressure injury of skin 02/23/2022   Septic shock ([place]) 02/22/2022   AKI (acute kidney injury) ([place])    Generalized abdominal pain    Lactic acidosis    Atrial fibrillation with RVR ([name])    Hypokalemia    Hypomagnesemia    Pain in finger of right hand 08/14/2021   Degenerative joint disease of shoulder region 05/07/2020   Olecranon bursitis of right elbow  05/07/2020   Pain in joint of right shoulder 04/26/2020   S/P TKR (total knee replacement), bilateral 11/04/2018   Bilateral primary osteoarthritis of knee    DDD (degenerative disc disease), cervical 01/10/2018   Rupture of medial head of gastrocnemius, left, sequela 01/14/2017   Kidney stones 06/22/2016   Essential hypertension 08/01/2010   DYSTHYMIC DISORDER 07/08/2010   PALPITATIONS 07/08/2010    REFERRING DIAG: Z89.511 (ICD-10-CM) - Hx of below knee amputation, right   ONSET DATE: 06/19/2022 Prosthesis delivery  THERAPY DIAG:  Other abnormalities of gait and mobility  Muscle weakness (generalized)  Unsteadiness on feet  Non-pressure chronic ulcer of skin of other sites with other specified severity ([name])  Foot drop, left  Edema, unspecified type  Pain in right lower leg  Rationale for Evaluation and Treatment Rehabilitation  PERTINENT  HISTORY: Rt BKA 03/15/22, Left MTA 03/15/2022, bil. TKA 11/04/18, ESRD / chronic kidney disease st5 dialysis Tues/Thurs/Sat, cirrhosis, cardiac arrest May 23 post intubation, A-Fib  PRECAUTIONS: Fall, BP only in RUE starting 10/6  SUBJECTIVE:  He has been walking more as advised. The prosthesis hurts a little.  He has been balancing standing on ramp.  He is wearing prosthesis all awake hours with under liner as it feels better.  He had fistula placed in LUE upper arm on Friday.   PAIN:  Are you having pain? Distal residual limb  0/10 when sitting & standing,1/10 with gait   OBJECTIVE: (objective measures completed at initial evaluation unless otherwise dated)    COGNITION: 06/22/2022  Overall cognitive status: Within functional limits for tasks assessed and pt had flat affect so unable to fully assess cognition.   POSTURE: 06/22/2022  rounded shoulders, forward head, flexed trunk , and weight shift left   LOWER EXTREMITY ROM:   ROM P:passive  A:active Right Eval 06/22/2022 Left Eval 06/22/2022  Hip flexion      Hip extension  Standing A: ~30* limitation Standing A: ~30* limitation  Hip abduction      Hip adduction      Hip internal rotation      Hip external rotation      Knee flexion      Knee extension Standing A: ~15* limitation Standing A: ~15* limitation  Ankle dorsiflexion   Standing A: ~25* limitation  Ankle plantarflexion      Ankle inversion      Ankle eversion       (Blank rows = not tested)   LOWER EXTREMITY MMT:   MMT Right Eval 06/22/2022 Left Eval 06/22/2022  Hip flexion 4/5 4/5  Hip extension 2/5 functional 2/5 functional  Hip abduction 2/5 functional 2/5 functional  Hip adduction      Hip internal rotation      Hip external rotation      Knee flexion      Knee extension 3-/5 functional 3-/5 functional  Ankle dorsiflexion 2-/5    Ankle plantarflexion      Ankle inversion      Ankle eversion      (Blank rows = not tested)   TRANSFERS: 06/22/2022  Sit to stand: Mod A and from 22" w/c requiring armrests to push & RW to stabilize 06/22/2022  Stand to sit: Min A from RW to 22" w/c unable to release RW to reach for w/c until seated.   GAIT: 06/22/2022 Gait pattern: step to pattern, decreased step length- Left, decreased stance time- Right, decreased stride length, decreased hip/knee flexion- Right, circumduction- Right, Right hip hike, knee flexed in stance- Right, knee flexed in stance- Left, antalgic, trunk flexed, abducted- Right, and poor foot clearance- Left excessive weightbearing BUEs on RW Distance walked: 5' Assistive device utilized: Environmental consultant - 2 wheeled and right TTA prosthesis / left plate & shoe filler Level of assistance: Mod A Comments: post gait HR 82, SpO2 99% with dyspnea noted   FUNCTIONAL TESTs:  08/24/2022 Merrilee Jansky Balance 12/56     06/22/2022  Static standing balance with BUE support on RW for 1 min with minA.     CURRENT PROSTHETIC WEAR ASSESSMENT: 06/22/2022 Patient is dependent with: skin check, residual limb care, care of non-amputated limb, prosthetic  cleaning, ply sock cleaning, correct ply sock adjustment, proper wear schedule/adjustment, and proper weight-bearing schedule/adjustment Donning prosthesis: Total A and max cues required, Doffing prosthesis: Total A and wife able to doffe Prosthetic wear  tolerance: 1-2 hours 1-2x/day, for 3 of 3 days since delivery.  Prosthetic weight bearing tolerance: 0 minutes unable to stand outside of PT Edema: pitting with >5sec capillary refill Residual limb condition: 105m wound with serous drainage & black scab present,  calf measurement is 42.2cm Prosthetic description:  silicon liner with pin suspension, flexible inner socket Total Contact design, dynamic response foot with hydraulic PF/DF K code/activity level with prosthetic use: Level 3     TODAY'S TREATMENT:  09/02/2022 Prosthetic Training with TTA prosthesis: Wound appears healed.  He continues with under liner as he reports more comfortable.  PT educated on sweat & need to switch if damp. Pt & wife verbalized understanding.  PT reviewed donning vac pin system with recommendation to lean posterior pelvis against counter when adjusting sleeve standing.  Pt arrived amb with his RW for first time today.  PT adjusted height to facilitate upright posture & pt reports feels better.  Pt amb 25' with cane stand alone tip & HHA with minA.  PT instructed pt & wife how to provide HHA. Pt amb 25', 464 & 529 with wife assisting & PT supervising with cane & HHA.  PT demo & verbal cues on sidestepping & walking backwards with TTA prosthesis. Pt performed with RUE on counter & LUE cane with supervision.  Pt & wife report how to work on technique at home.    08/24/2022 Prosthetic Training with TTA prosthesis: Wound on residual limb is continuing to heal and is flat across without pinprick appearance, PT advised to continue with Vivewear under liner & continuous monitoring of raised scab or suture expulsion and pt and wife verbalized understanding. PT demo &  verbal cues on donning new Vacuum Pin System. Pt verbalized & return understanding.  New socket / prosthesis weighs 3.6kg. pt verbalized understanding to adjust if he wears to dialysis. We will need to reweigh it when prosthetist switches socket to laminated version. Pt verbalized understanding.  Pt ambulated 150' with RW safely.  PT recommended not sitting in w/c in home when wearing prosthesis to facilitate mobility by walking with RW.  Pt neg ramp & curb with RW with supervision. PT recommended entering /exiting their home with wife supervision and initiate community mobility ambulating with wife following with w/c. Pt & wife verbalized understanding.  Pt amb 20' including 180* turn with cane stand alone tip with modA for balance issues.   08/12/2022 Prosthetic Training with TTA prosthesis: Wound on residual limb is continuing to heal and is flat across without pinprick appearance, PT advised continuous monitoring of raised scab or suture expulsion and pt and wife verbalized understanding. PT educated on re-weighing new system to provide info for dialysis to subtract from weigh in & out, pt and wife verbalized understanding Pt educated on signs of sweating and management with drying liner and limb, pt verbalized understanding and received printed handout of information  Neuro Re-ed: Standing on floor eyes open feet ~4-5" apart head turns right/left, up/down & 2 diagonals with frequent touch //bars due to anterior losses of balance Standing eyes closed feet ~4-5" apart frequent UE support and able to stand ~2s at a time, anterior and posterior losses of balance with weight shift left Standing on floor eyes closed feet ~4-5" apart head turns right/left, up/down & 2 diagonals back of hand BUE support on //bars, occasional anterior losses of balance and pt required frequent verbal and tactile cueing for upright posture with reported fatigue following activity  08/10/2022 Prosthetic Training with TTA  prosthesis: PT educated on consistent prosthesis wear for skin and edema concerns, donning socks after checking alignment of liner and prosthesis, management of burning pain due to friction from liner donning. pt verbalized understanding Prosthetist ground down anterior hard socket in front of distal tibia. Wound on residual limb is 77m across and circular with scab covering, with one darker spot in center of wound PT reviewed prosthesis donning with liner and pin alignment, seated positioning. Pt verbalized understanding PT advised to ask MD about UE restrictions following fistula surgery, pt and wife verbalized understanding STS w/c to RW x 3 with verbal cueing for hand placement Pt reached 10" with single UE assist on RW with SBA Pt turned over each shoulder with decreased rotation and minA to control walker with turn to Lt, turn to Rt was SBA Pt amb 135' with SBA and heavy UE support on RW due to pain, increased to 5/10 posterior calf constant pain and pain decreased with limb elevation PT advised consistent wear and short walks with RW to increase tolerance, elevating leg to decrease pain. pt and wife verbalized understanding   HOME EXERCISE PROGRAM:  AT [place] PLACE FEET EQUAL DISTANCE FROM THE MIDLINE.  Try to find this position when standing still for activities.   USE TAPE ON FLOOR TO MARK THE MIDLINE POSITION which is even with middle of sink.  You also should try to feel with your limb pressure in socket.  You are trying to feel with limb what you used to feel with the bottom of your foot.  Side to Side Shift: Moving your hips only (not shoulders): move weight onto your left leg, HOLD/FEEL pressure in socket.  Move back to equal weight on each leg, HOLD/FEEL pressure in socket. Move weight onto your right leg, HOLD/FEEL pressure in socket. Move back to equal weight on each leg, HOLD/FEEL pressure in socket. Repeat.  Start with both hands on sink, progress  to hand on prosthetic side only, then no hands.  Front to Back Shift: Moving your hips only (not shoulders): move your weight forward onto your toes, HOLD/FEEL pressure in socket. Move your weight back to equal Flat Foot on both legs, HOLD/FEEL  pressure in socket. Move your weight back onto your heels, HOLD/FEEL  pressure in socket. Move your weight back to equal on both legs, HOLD/FEEL  pressure in socket. Repeat.  Start with both hands on sink, progress to hand on prosthetic side only, then no hands.  Moving Cones / Cups: With equal weight on each leg: Hold on with one hand the first time, then progress to no hand supports. Move cups from one side of sink to the other. Place cups ~2" out of your reach, progress to 10" beyond reach.  Place one hand in middle of sink and reach with other hand. Do both arms.  Then hover one hand and move cups with other hand.  Overhead/Upward Reaching: alternated reaching up to top cabinets or ceiling if no cabinets present. Keep equal weight on each leg. Start with one hand support on counter while other hand reaches and progress to no hand support with reaching.  ace one hand in middle of sink and reach with other hand. Do both arms.  Then hover one hand and move cups with other hand.  5.   Looking Over Shoulders: With equal weight on each leg: alternate turning to look over your shoulders with one hand support on counter as needed.  Start with head  motions only to look in front of shoulder, then even with shoulder and progress to looking behind you. To look to side, move head /eyes, then shoulder on side looking pulls back, shift more weight to side looking and pull hip back. Place one hand in middle of sink and let go with other hand so your shoulder can pull back. Switch hands to look other way.   Then hover one hand and look over shoulder. If looking right, use left hand at sink. If looking left, use right hand at sink. 6.  Stepping with leg that is not amputated:  Move  items under cabinet out of your way. Shift your hips/pelvis so weight on prosthesis. Tighten muscles in hip on prosthetic side.  SLOWLY step other leg so front of foot is in cabinet. Then step back to floor.   Pt also recommending sitting at edge of w/c with feet supported on floor but no back support each hour in w/c until back fatigues. PT also demo & instructed in sitting on 24" stool. Pt & wife verbalized understanding.     ASSESSMENT:   CLINICAL IMPRESSION: Patient is tolerating longer wear & weight bearing without skin issues & less limb pain.  Pt & wife appear to understand how to work on gait with cane & HHA.   He continues to benefit from skilled PT.     OBJECTIVE IMPAIRMENTS Abnormal gait, cardiopulmonary status limiting activity, decreased activity tolerance, decreased balance, decreased endurance, decreased knowledge of condition, decreased knowledge of use of DME, decreased mobility, difficulty walking, decreased ROM, decreased strength, increased edema, impaired flexibility, postural dysfunction, and prosthetic dependency .    ACTIVITY LIMITATIONS carrying, lifting, bending, sitting, standing, squatting, stairs, transfers, locomotion level, and prosthesis use   PARTICIPATION LIMITATIONS: driving, community activity, occupation, yard work, and recreational of camping   [place], Past/current experiences, Time since onset of injury/illness/exacerbation, and 3+ comorbidities: see PMH  are also affecting patient's functional outcome.    REHAB POTENTIAL: Good   CLINICAL DECISION MAKING: Evolving/moderate complexity   EVALUATION COMPLEXITY: Moderate     GOALS: Goals reviewed with patient? Yes   SHORT TERM GOALS: Target date: 09/23/2022   Patient donnes new prosthesis modified independent & verbalizes adjusting ply socks.  Goal status: Set 08/24/2022 2.  Patient tolerates prosthesis >90% awake hrs /day without increase in skin issues & no limb pain. Goal status:  Set 08/24/2022   3.  Patient able to stand 2 min without UE support with supervision. Goal status: Set 08/24/2022   4. Patient ambulates 64' with cane & prosthesis with minA. Goal status: Set 08/24/2022    UPDATED LONG TERM GOALS: Target date: 11/19/2022   Patient demonstrates & verbalized understanding of prosthetic care to enable safe utilization of prosthesis. Goal status: Ongoing 08/24/2022  2.   Patient tolerates prosthesis wear >90% of awake hours without skin or limb pain issues. Goal status: Ongoing 08/24/2022   3.   Berg Balance >/= 36/56 to indicate lower fall risk Goal status: Ongoing 08/24/2022   4.   Patient ambulates >300' with prosthesis & LRAD independently Goal status:Ongoing 08/24/2022   5.   Patient negotiates ramps, curbs & stairs with single rail with prosthesis & LRAD independently. Goal status: Ongoing 08/24/2022     PLAN: PT FREQUENCY: 1x/week for 8 additional visits    PT DURATION: 12 weeks   PLANNED INTERVENTIONS: Therapeutic exercises, Therapeutic activity, Neuromuscular re-education, Balance training, Gait training, Patient/Family education, Self Care, Stair training, Vestibular training, Canalith repositioning,  Prosthetic training, DME instructions, Vasopneumatic device, and physical performance testing   PLAN FOR NEXT SESSION:   check residual limb,   prosthetic gait with cane without HHA, work on ramp & curb with cane, standing balance, continue 1x/wk due to visit limit   Jamey Reas, PT, DPT 09/02/2022, 12:57 PM

## 2022-09-03 ENCOUNTER — Other Ambulatory Visit (HOSPITAL_COMMUNITY): Payer: Self-pay | Admitting: *Deleted

## 2022-09-03 DIAGNOSIS — N2581 Secondary hyperparathyroidism of renal origin: Secondary | ICD-10-CM | POA: Diagnosis not present

## 2022-09-03 DIAGNOSIS — Z992 Dependence on renal dialysis: Secondary | ICD-10-CM | POA: Diagnosis not present

## 2022-09-03 DIAGNOSIS — N186 End stage renal disease: Secondary | ICD-10-CM | POA: Diagnosis not present

## 2022-09-04 ENCOUNTER — Other Ambulatory Visit: Payer: Self-pay

## 2022-09-04 DIAGNOSIS — K7460 Unspecified cirrhosis of liver: Secondary | ICD-10-CM

## 2022-09-04 DIAGNOSIS — D649 Anemia, unspecified: Secondary | ICD-10-CM

## 2022-09-04 DIAGNOSIS — N186 End stage renal disease: Secondary | ICD-10-CM

## 2022-09-05 DIAGNOSIS — N2581 Secondary hyperparathyroidism of renal origin: Secondary | ICD-10-CM | POA: Diagnosis not present

## 2022-09-05 DIAGNOSIS — Z992 Dependence on renal dialysis: Secondary | ICD-10-CM | POA: Diagnosis not present

## 2022-09-05 DIAGNOSIS — N186 End stage renal disease: Secondary | ICD-10-CM | POA: Diagnosis not present

## 2022-09-07 ENCOUNTER — Other Ambulatory Visit (HOSPITAL_COMMUNITY): Payer: Self-pay | Admitting: *Deleted

## 2022-09-07 NOTE — Progress Notes (Signed)
SURGICAL WAITING ROOM VISITATION Patients having surgery or a procedure may have no more than 2 support people in the waiting area - these visitors may rotate.   Children under the age of 103 must have an adult with them who is not the patient. If the patient needs to stay at the hospital during part of their recovery, the visitor guidelines for inpatient rooms apply. Pre-op nurse will coordinate an appropriate time for 1 support person to accompany patient in pre-op.  This support person may not rotate.    Please refer to the [facility] website for the visitor guidelines for Inpatients (after your surgery is over and you are in a regular room).       Your procedure is scheduled on:  09/14/22    Report to [facility] Main Entrance    Report to admitting at   [phone]   Call this number if you have problems the morning of surgery [phone]   Do not eat food :After Midnight.   After Midnight you may have the following liquids until __ 0545____ AM/  DAY OF SURGERY  Water Non-Citrus Juices (without pulp, NO RED) Carbonated Beverages Black Coffee (NO MILK/CREAM OR CREAMERS, sugar ok)  Clear Tea (NO MILK/CREAM OR CREAMERS, sugar ok) regular and decaf                             Plain Jell-O (NO RED)                                           Fruit ices (not with fruit pulp, NO RED)                                     Popsicles (NO RED)                                                               Sports drinks like Gatorade (NO RED)                       If you have questions, please contact your surgeon's office.       Oral Hygiene is also important to reduce your risk of infection.                                    Remember - BRUSH YOUR TEETH THE MORNING OF SURGERY WITH YOUR REGULAR TOOTHPASTE   Do NOT smoke after Midnight   Take these medicines the morning of surgery with A SIP OF WATER:  gabapentin   DO NOT TAKE ANY ORAL DIABETIC MEDICATIONS DAY OF YOUR  SURGERY  Bring CPAP mask and tubing day of surgery.                              You may not have any metal on your body including hair pins, jewelry, and body piercing  Do not wear make-up, lotions, powders, perfumes/cologne, or deodorant  Do not wear nail polish including gel and S&S, artificial/acrylic nails, or any other type of covering on natural nails including finger and toenails. If you have artificial nails, gel coating, etc. that needs to be removed by a nail salon please have this removed prior to surgery or surgery may need to be canceled/ delayed if the surgeon/ anesthesia feels like they are unable to be safely monitored.   Do not shave  48 hours prior to surgery.               Men may shave face and neck.   Do not bring valuables to the hospital. [place].   Contacts, dentures or bridgework may not be worn into surgery.   Bring small overnight bag day of surgery.   DO NOT [place]. PHARMACY WILL DISPENSE MEDICATIONS LISTED ON YOUR MEDICATION LIST TO YOU DURING YOUR ADMISSION [place]!    Patients discharged on the day of surgery will not be allowed to drive home.  Someone NEEDS to stay with you for the first 24 hours after anesthesia.   Special Instructions: Bring a copy of your healthcare power of attorney and living will documents the day of surgery if you haven't scanned them before.              Please read over the following fact sheets you were given: IF [place] [id number]   If you received a COVID test during your pre-op visit  it is requested that you wear a mask when out in public, stay away from anyone that may not be feeling well and notify your surgeon if you develop symptoms. If you test positive for Covid or have been in contact with anyone that has tested positive in the last 10 days please notify  you surgeon.     [place] - Preparing for Surgery Before surgery, you can play an important role.  Because skin is not sterile, your skin needs to be as free of germs as possible.  You can reduce the number of germs on your skin by washing with CHG (chlorahexidine gluconate) soap before surgery.  CHG is an antiseptic cleaner which kills germs and bonds with the skin to continue killing germs even after washing. Please DO NOT use if you have an allergy to CHG or antibacterial soaps.  If your skin becomes reddened/irritated stop using the CHG and inform your nurse when you arrive at Short Stay. Do not shave (including legs and underarms) for at least 48 hours prior to the first CHG shower.  You may shave your face/neck. Please follow these instructions carefully:  1.  Shower with CHG Soap the night before surgery and the  morning of Surgery.  2.  If you choose to wash your hair, wash your hair first as usual with your  normal  shampoo.  3.  After you shampoo, rinse your hair and body thoroughly to remove the  shampoo.                           4.  Use CHG as you would any other liquid soap.  You can apply chg directly  to the skin and wash  Gently with a scrungie or clean washcloth.  5.  Apply the CHG Soap to your body ONLY FROM THE NECK DOWN.   Do not use on face/ open                           Wound or open sores. Avoid contact with eyes, ears mouth and genitals (private parts).                       Wash face,  Genitals (private parts) with your normal soap.             6.  Wash thoroughly, paying special attention to the area where your surgery  will be performed.  7.  Thoroughly rinse your body with warm water from the neck down.  8.  DO NOT shower/wash with your normal soap after using and rinsing off  the CHG Soap.                9.  Pat yourself dry with a clean towel.            10.  Wear clean pajamas.            11.  Place clean sheets on your bed the night of your  first shower and do not  sleep with pets. Day of Surgery : Do not apply any lotions/deodorants the morning of surgery.  Please wear clean clothes to the hospital/surgery center.  FAILURE TO FOLLOW THESE INSTRUCTIONS MAY RESULT IN THE CANCELLATION OF YOUR SURGERY PATIENT SIGNATURE_________________________________  NURSE SIGNATURE__________________________________  ________________________________________________________________________

## 2022-09-08 DIAGNOSIS — Z992 Dependence on renal dialysis: Secondary | ICD-10-CM | POA: Diagnosis not present

## 2022-09-08 DIAGNOSIS — N2581 Secondary hyperparathyroidism of renal origin: Secondary | ICD-10-CM | POA: Diagnosis not present

## 2022-09-08 DIAGNOSIS — N186 End stage renal disease: Secondary | ICD-10-CM | POA: Diagnosis not present

## 2022-09-08 DIAGNOSIS — Z23 Encounter for immunization: Secondary | ICD-10-CM | POA: Diagnosis not present

## 2022-09-09 ENCOUNTER — Encounter: Payer: BC Managed Care – PPO | Admitting: Physical Therapy

## 2022-09-09 ENCOUNTER — Other Ambulatory Visit: Payer: Self-pay

## 2022-09-09 ENCOUNTER — Encounter (HOSPITAL_COMMUNITY): Payer: Self-pay

## 2022-09-09 ENCOUNTER — Encounter (HOSPITAL_COMMUNITY)
Admission: RE | Admit: 2022-09-09 | Discharge: 2022-09-09 | Disposition: A | Discharge status: Home / Self Care | Payer: BC Managed Care – PPO | Source: Ambulatory Visit | Attending: Urology | Admitting: Urology

## 2022-09-09 VITALS — BP 117/72 | HR 94 | Temp 98.7°F | Resp 16 | Ht 70.0 in

## 2022-09-09 DIAGNOSIS — Z01818 Encounter for other preprocedural examination: Secondary | ICD-10-CM

## 2022-09-09 DIAGNOSIS — Z01812 Encounter for preprocedural laboratory examination: Secondary | ICD-10-CM | POA: Diagnosis not present

## 2022-09-09 HISTORY — DX: Unspecified osteoarthritis, unspecified site: M19.90

## 2022-09-09 LAB — CBC
HCT: 24.6 % — ABNORMAL LOW (ref 39.0–52.0)
Hemoglobin: 8.2 g/dL — ABNORMAL LOW (ref 13.0–17.0)
MCH: 34.5 pg — ABNORMAL HIGH (ref 26.0–34.0)
MCHC: 33.3 g/dL (ref 30.0–36.0)
MCV: 103.4 fL — ABNORMAL HIGH (ref 80.0–100.0)
Platelets: 209 10*3/uL (ref 150–400)
RBC: 2.38 MIL/uL — ABNORMAL LOW (ref 4.22–5.81)
RDW: 17.6 % — ABNORMAL HIGH (ref 11.5–15.5)
WBC: 6.9 10*3/uL (ref 4.0–10.5)
nRBC: 0 % (ref 0.0–0.2)

## 2022-09-09 NOTE — Progress Notes (Addendum)
Anesthesia Review:  PCP: Harland Dingwall, NP  LOV 08/26/22  Cardiologist : Eleonore Chiquito Has cardiology appt on 09/11/22  LOV 07/03/22  Kidney- Dr Arty Baumgartner  Chest x-ray : 06/09/22- 2 view  EKG : 08/15/22  Echo : 02/23/22  Stress test: Cardiac Cath :  Activity level:  Sleep Study/ CPAP : Fasting Blood Sugar :      / Checks Blood Sugar -- times a day:   Blood Thinner/ Instructions /Last Dose: ASA / Instructions/ Last Dose :   PT reports at preop having cough x approx  10 days.  Has not done Covid test.  Has been going to dialysis.  PT without cough at preop.  Placed mask on pt.  No other symptoms. Instructed wife at end of preop that pt will need to obtain covid test prior to surgery.  And reports results to surgeon. Wife voiced understanding.   In 02/2022- PT became septic due to scrotum infection.  Developed gangrene in scrotum.  Cardiac arrested.  In hospital 02/2022  did not wake up until 03/2022.  Currently on hemodialysis due to sepsis .  T/TH / Sat in [place].  NOt on a fluid restriction per pt.  Dialysis access at right anterior chest.  Waiting on left arm fistula to mature per pt.  PT has left foot partial amputation related to this and also right BKA prosthesis.  Currently in wheelchair and learning how to use a walker per pt .  CBC done 09/09/22 routed to Dr Alinda Money.

## 2022-09-10 DIAGNOSIS — N2581 Secondary hyperparathyroidism of renal origin: Secondary | ICD-10-CM | POA: Diagnosis not present

## 2022-09-10 DIAGNOSIS — Z992 Dependence on renal dialysis: Secondary | ICD-10-CM | POA: Diagnosis not present

## 2022-09-10 DIAGNOSIS — N186 End stage renal disease: Secondary | ICD-10-CM | POA: Diagnosis not present

## 2022-09-10 DIAGNOSIS — Z23 Encounter for immunization: Secondary | ICD-10-CM | POA: Diagnosis not present

## 2022-09-10 DIAGNOSIS — U071 COVID-19: Secondary | ICD-10-CM | POA: Diagnosis not present

## 2022-09-10 NOTE — Progress Notes (Signed)
Cardiology Office Note:   Date:  09/11/2022  NAME:  Jeffrey Ayala    MRN: [id number] DOB:  03/23/1961   PCP:  [name], NP-C  Cardiologist:  Evalina Field, MD  Electrophysiologist:  None   Referring MD: [name], NP-C   Chief Complaint  Patient presents with   Follow-up        History of Present Illness:   Jeffrey Ayala is a 61 y.o. male with a hx of ESRD on hemodialysis, persistent atrial fibrillation, anemia presents for follow-up.  Reports he is doing well.  Blood counts are stable.  Seen hematology.  They believe this is a kidney issue.  He will undergo EGD and colonoscopy on December 4 with Dr. Havery Moros.  This will definitively let us know if there is any bleeding.  We discussed that I am a little reluctant to start Eliquis until we know exactly what is going on.  I think we should wait until his EGD and colonoscopy are done.  Everyone is in agreement.  Problem List Cardiac Arrest -2/2 septic shock 02/22/2022-04/03/2022 -scrotal infection  -2/2 troponin elevation, normal echo 2. AKI on HD -TTS perm cath  3. LE gangrene -s/p L transmetatarsal amputation, R BKA -2/2 pressors from shock  4. Paroxysmal Afib -CHADSVASC=2 (CAD, HTN)  5. Anemia -AC on hold   Past Medical History: Past Medical History:  Diagnosis Date   A-fib ([place])    Anemia    Arthritis    Atrial fibrillation, chronic ([name])    not on [facility]   Cardiac arrest ([place]) 02/2022   in the setting of septic shock   Cirrhosis of liver ([name])    Concussion    Eosinophilic esophagitis    EGD/bx by Dr. Havery Moros   ESRD needing dialysis [facility])    GERD (gastroesophageal reflux disease)    History of gastric ulcer    History of kidney stones    Intermittent dysphagia    Scrotal infection 02/2022   Septic shock ([place]) 02/2022   Vasoplegia - > L TMA and R BKA    Past Surgical History: Past Surgical History:  Procedure Laterality Date   AMPUTATION Right 03/15/2022   Procedure: RIGHT BELOW  KNEE AMPUTATION;  Surgeon: [name], MD;  Location: [place];  Service: Orthopedics;  Laterality: Right;   AV FISTULA PLACEMENT Left 08/28/2022   Procedure: LEFT ARM ARTERIOVENOUS (AV) FISTULA CREATION;  Surgeon: Waynetta Sandy, MD;  Location: [place];  Service: Vascular;  Laterality: Left;   CYSTOSCOPY     CYSTOSCOPY WITH STENT PLACEMENT Bilateral 08/15/2022   Procedure: CYSTOSCOPY WITH STENT PLACEMENT;  Surgeon: Remi Haggard, MD;  Location: [place];  Service: Urology;  Laterality: Bilateral;   DENTAL SURGERY     FINGER SURGERY     HERNIA REPAIR     at age 66   IR FLUORO GUIDE CV LINE RIGHT  03/09/2022   IR PATIENT EVAL TECH 0-60 MINS  03/09/2022   IR US GUIDE VASC ACCESS RIGHT  03/09/2022   KNEE ARTHROSCOPY Left    LITHOTRIPSY     NEPHROLITHOTOMY     SCROTAL EXPLORATION N/A 03/16/2022   Procedure: IRRIGATION AND DEBRIDEMENT SCROTUM;  Surgeon: Raynelle Bring, MD;  Location: [facility];  Service: Urology;  Laterality: N/A;   SCROTAL EXPLORATION N/A 03/28/2022   Procedure: IRRIGATION AND DEBRIDEMENT SCROTUM;  Surgeon: Ardis Hughs, MD;  Location: [facility];  Service: Urology;  Laterality: N/A;   TONSILLECTOMY  TOTAL KNEE ARTHROPLASTY Bilateral 11/04/2018   Procedure: BILATERAL TOTAL KNEE ARTHROPLASTY;  Surgeon: [name], MD;  Location: [facility];  Service: Orthopedics;  Laterality: Bilateral;  spinal/epidural per anesthesiologist   TRANSMETATARSAL AMPUTATION Left 03/15/2022   Procedure: LEFT TRANSMETATARSAL AMPUTATION;  Surgeon: [name], MD;  Location: [place];  Service: Orthopedics;  Laterality: Left;    Current Medications: Current Meds  Medication Sig   allopurinol (ZYLOPRIM) 100 MG tablet Take 1 tablet (100 mg total) by mouth daily. (Patient taking differently: Take 100 mg by mouth every evening.)   B Complex-C-Zn-Folic Acid (DIALYVITE 751-WCHE 15) 0.8 MG TABS Take 1 tablet by mouth every evening.   diphenhydramine-acetaminophen (TYLENOL PM) 25-500 MG TABS tablet Take  1 tablet by mouth at bedtime as needed (sleep).   gabapentin (NEURONTIN) 100 MG capsule Take 100 mg by mouth 2 (two) times daily.   oxyCODONE-acetaminophen (PERCOCET) 5-325 MG tablet Take 1 tablet by mouth every 6 (six) hours as needed for severe pain.   pantoprazole (PROTONIX) 40 MG tablet Take 1 tablet (40 mg total) by mouth 2 (two) times daily. (Patient taking differently: Take 40 mg by mouth every evening.)   PEG-KCl-NaCl-NaSulf-Na Asc-C (PLENVU) 140 g SOLR Use as directed   tolterodine (DETROL LA) 4 MG 24 hr capsule Take 1 capsule (4 mg total) by mouth daily.   traZODone (DESYREL) 50 MG tablet TAKE 1/2 OR 1 TABLET BY MOUTH AT BEDTIME AS NEEDED FOR SLEEP (Patient taking differently: Take 50 mg by mouth at bedtime.)     Allergies:    Codeine and Flomax [tamsulosin hcl]   Social History: Social History   Socioeconomic History   Marital status: Married    Spouse name: Not on file   Number of children: 2   Years of education: Not on file   Highest education level: Not on file  Occupational History   Occupation: Press photographer - Clinical cytogeneticist  Tobacco Use   Smoking status: Never   Smokeless tobacco: Never  Vaping Use   Vaping Use: Never used  Substance and Sexual Activity   Alcohol use: Not Currently    Comment: occassional   Drug use: No   Sexual activity: Not Currently  Other Topics Concern   Not on file  Social History Narrative   Not on file   Social Determinants of Health   Financial Resource Strain: Not on file  Food Insecurity: No Food Insecurity (08/19/2022)   Hunger Vital Sign    Worried About Running Out of Food in the Last Year: Never true    Ran Out of Food in the Last Year: Never true  Transportation Needs: No Transportation Needs (08/19/2022)   PRAPARE - Hydrologist (Medical): No    Lack of Transportation (Non-Medical): No  Physical Activity: Not on file  Stress: Not on file  Social Connections: Not on file     Family  History: The patient's family history includes Arrhythmia in his mother; COPD in his mother; Cancer in his brother; Sarcoidosis in his brother. There is no history of Stomach cancer, Pancreatic cancer, Esophageal cancer, Colon cancer, or Rectal cancer.  ROS:   All other ROS reviewed and negative. Pertinent positives noted in the HPI.     EKGs/Labs/Other Studies Reviewed:   The following studies were personally reviewed by me today:   Recent Labs: 02/22/2022: B Natriuretic Peptide 307.0; TSH 0.687 04/18/2022: Magnesium 1.9 08/15/2022: ALT 16 08/28/2022: BUN 17; Creatinine, Ser 3.10; Potassium 4.0; Sodium 137 09/09/2022:  Hemoglobin 8.2; Platelets 209   Recent Lipid Panel    Component Value Date/Time   TRIG 159 (H) 03/17/2022 0754    Physical Exam:   VS:  BP 111/74   Pulse (!) 52   Ht '5\' 10"'$  (1.778 m)   Wt 200 lb (90.7 kg)   SpO2 99%   BMI 28.70 kg/m    Wt Readings from Last 3 Encounters:  09/11/22 200 lb (90.7 kg)  08/28/22 200 lb (90.7 kg)  08/15/22 213 lb 6.5 oz (96.8 kg)    General: Well nourished, well developed, in no acute distress Head: Atraumatic, normal size  Eyes: PEERLA, EOMI  Neck: Supple, no JVD Endocrine: No thryomegaly Cardiac: Normal S1, S2; irregular rhythm Lungs: Clear to auscultation bilaterally, no wheezing, rhonchi or rales  Abd: Soft, nontender, no hepatomegaly  Ext: Status post bilateral leg amputation Musculoskeletal: Bilateral leg amputation Skin: Warm and dry, no rashes   Neuro: Alert and oriented to person, place, time, and situation, CNII-XII grossly intact, no focal deficits  Psych: Normal mood and affect   ASSESSMENT:   Jeffrey Ayala is a 61 y.o. male who presents for the following: 1. Paroxysmal atrial fibrillation ([name])     PLAN:   1. Paroxysmal atrial fibrillation ([name]) -Remains in rate controlled A-fib.  Now on dialysis.  Still anemic but counts are stable.  He will undergo EGD and colonoscopy on December 4.  I would like to hold  Eliquis until this is done.  Would like to make sure there is no definitive GI source of bleeding.  We will then plan to see him on December 6 and set him up for cardioversion.  Would like to make sure he can tolerate Eliquis without any issues first. -Not currently on rate control medications.  Blood pressure has not tolerated this.  Ejection fraction is normal.  CHA2DS2-VASc equals 2 due to hypertension and CAD.  Anticoagulation on hold.  Rates are controlled without medication.  Disposition: Return in about 6 weeks (around 10/23/2022).  Medication Adjustments/Labs and Tests Ordered: Current medicines are reviewed at length with the patient today.  Concerns regarding medicines are outlined above.  No orders of the defined types were placed in this encounter.  No orders of the defined types were placed in this encounter.   Patient Instructions  Medication Instructions:  The current medical regimen is effective;  continue present plan and medications.  *If you need a refill on your cardiac medications before your next appointment, please call your pharmacy*   Follow-Up: At [facility], you and your health needs are our priority.  As part of our continuing mission to provide you with exceptional heart care, we have created designated Provider Care Teams.  These Care Teams include your primary Cardiologist (physician) and Advanced Practice Providers (APPs -  Physician Assistants and Nurse Practitioners) who all work together to provide you with the care you need, when you need it.  We recommend signing up for the patient portal called "MyChart".  Sign up information is provided on this After Visit Summary.  MyChart is used to connect with patients for Virtual Visits (Telemedicine).  Patients are able to view lab/test results, encounter notes, upcoming appointments, etc.  Non-urgent messages can be sent to your provider as well.   To learn more about what you can do with MyChart, go to  [email or web address].    Your next appointment:   December 6th at 8:20 AM   The format for your next appointment:  In Person  Provider:   Evalina Field, MD           Time Spent with Patient: I have spent a total of 25 minutes with patient reviewing hospital notes, telemetry, EKGs, labs and examining the patient as well as establishing an assessment and plan that was discussed with the patient.  > 50% of time was spent in direct patient care.  Signed, Addison Naegeli. Audie Box, MD, [place]  [address] [place], [place] [address] [phone]  09/11/2022 10:05 AM

## 2022-09-11 ENCOUNTER — Encounter: Payer: Self-pay | Admitting: Cardiovascular Disease

## 2022-09-11 ENCOUNTER — Ambulatory Visit: Payer: BC Managed Care – PPO | Attending: Cardiovascular Disease | Admitting: Cardiovascular Disease

## 2022-09-11 VITALS — BP 111/74 | HR 52 | Ht 70.0 in | Wt 200.0 lb

## 2022-09-11 DIAGNOSIS — I480 Paroxysmal atrial fibrillation: Secondary | ICD-10-CM

## 2022-09-11 NOTE — Patient Instructions (Signed)
Medication Instructions:  The current medical regimen is effective;  continue present plan and medications.  *If you need a refill on your cardiac medications before your next appointment, please call your pharmacy*   Follow-Up: At [facility], you and your health needs are our priority.  As part of our continuing mission to provide you with exceptional heart care, we have created designated Provider Care Teams.  These Care Teams include your primary Cardiologist (physician) and Advanced Practice Providers (APPs -  Physician Assistants and Nurse Practitioners) who all work together to provide you with the care you need, when you need it.  We recommend signing up for the patient portal called "MyChart".  Sign up information is provided on this After Visit Summary.  MyChart is used to connect with patients for Virtual Visits (Telemedicine).  Patients are able to view lab/test results, encounter notes, upcoming appointments, etc.  Non-urgent messages can be sent to your provider as well.   To learn more about what you can do with MyChart, go to [email or web address].    Your next appointment:   December 6th at 8:20 AM   The format for your next appointment:   In Person  Provider:   Evalina Field, MD

## 2022-09-11 NOTE — H&P (Signed)
Office Visit Report     08/26/2022   --------------------------------------------------------------------------------   Jeffrey Ayala  MRN: [id number]  DOB: 1961/10/17, 61 year old Male  SSN: [id number]   PRIMARY CARE:  Jeffrey Flaming, MD  REFERRING:    PROVIDER:  Raynelle Ayala, M.D.  LOCATION:  [facility] [phone]     --------------------------------------------------------------------------------   CC/HPI: Bilateral ureteral and left renal pelvic calculi   Jeffrey Ayala is a 61 year old gentleman who has previously been followed by Jeffrey Ayala in the distant past for recurrent urolithiasis. He apparently has had a combination of uric acid/calcium oxalate stones. He presented to the emergency department in April when I initially saw him for sepsis of unknown origin. He did have scrotal edema although no signs of Fournier's gangrene at that time. He was followed and eventually improved. He did require a below the knee amputation on 1 side. He eventually did develop some necrosis of the scrotum requiring debridement. More recently, he presented to the emergency department on 08/15/2022 and was found to have bilateral ureteral calculi. He is on hemodialysis but does make a significant amount of urine with regard to volume. He underwent bilateral ureteral stent placement by Jeffrey Ayala. He receives hemodialysis on Tuesday, Thursday, and Saturday. There has been some improvement of his renal function indicating that there may be some recovery potential. He follows up today to discuss definitive management.     ALLERGIES: Codeine Derivatives Tamsulosin HCl CAPS TraMADol HCl TABS    MEDICATIONS: Allopurinol 100 mg tablet 1 tablet PO Daily  Detrol La 4 mg capsule, ext release 24 hr  Gabapentin 100 mg capsule  Multivitamin  Pantoprazole Sodium 40 mg tablet, delayed release  Protonix  Trazodone Hcl  Tylenol 8 Hour 650 mg tablet, extended release  Vitamin C  Vitamin C      GU PSH: Cystoscopy Insert Stent - 2012 Debride Genitalia & Perineum - 03/16/2022 ESWL - 2015, 2015, 2015, 2012, 2012 PCNL - 2012       [name] Notes: Lithotripsy, Lithotripsy, Lithotripsy, Lithotripsy, Percutaneous Lithotomy, Tonsillectomy, Lithotripsy, Inguinal Hernia Repair, Cystoscopy With Insertion Of Ureteral Stent Right   NON-GU PSH: Knee replacement Late Closure Wound - 03/28/2022 Remove Tonsils - 2012     GU PMH: Scrotal abscess/inflammation - 06/12/2022, - 04/29/2022 Flank Pain, HE has some right sided pain but nothing to suggest a ureteral stone and this is probably related to a musculoskeletal cause. - 2020 Bladder Stone, He has a 74m bladder stone. I discussed removal and lithotripsy but he has been able to pass large stones previously and would like to give this one some time. He will return in 2-3 weeks with a KUB and renal [place] - 2020 Renal calculus - 2017, Nephrolithiasis, - 2016 Gross hematuria, Gross hematuria - 2017 Ureteral calculus, Ureteral calculus, left - 2017 Encounter for Prostate Cancer screening, Prostate cancer screening - 2015 Other microscopic hematuria, Microhematuria - 2015 Dorsalgia, Unspec, Back pain - 2015 History of urolithiasis, Nephrolithiasis - 2014      PMH Notes:  2011-06-23 12:26:58 - Note: Staghorn Calculus   NON-GU PMH: Encounter for general adult medical examination without abnormal findings, Encounter for preventive health examination - 2015 Bacteriuria, Bacteriuria, asymptomatic - 2015 Gout    FAMILY HISTORY: Diabetes - Mother Family Health Status Number - Runs In Family nephrolithiasis - Mother   SOCIAL HISTORY: Marital Status: Married Preferred Language: English; Race: White Current Smoking Status: Patient has never smoked.   Tobacco Use Assessment Completed: Used  Tobacco in last 30 days? Drinks 2 caffeinated drinks per day.     Notes: Occupation, Number of children, Caffeine use, Never a smoker, Social alcohol use, Married    REVIEW OF SYSTEMS:    GU Review Male:   Patient denies frequent urination, hard to postpone urination, burning/ pain with urination, get up at night to urinate, leakage of urine, stream starts and stops, trouble starting your streams, and have to strain to urinate .  Gastrointestinal (Lower):   Patient denies diarrhea and constipation.  Gastrointestinal (Upper):   Patient denies nausea and vomiting.  Constitutional:   Patient denies fever, night sweats, weight loss, and fatigue.  Skin:   Patient denies skin rash/ lesion and itching.  Eyes:   Patient denies blurred vision and double vision.  Ears/ Nose/ Throat:   Patient denies sore throat and sinus problems.  Hematologic/Lymphatic:   Patient denies easy bruising and swollen glands.  Cardiovascular:   Patient denies leg swelling and chest pains.  Respiratory:   Patient denies cough and shortness of breath.  Endocrine:   Patient denies excessive thirst.  Musculoskeletal:   Patient denies back pain and joint pain.  Neurological:   Patient denies headaches and dizziness.  Psychologic:   Patient denies depression and anxiety.   VITAL SIGNS:      08/26/2022 02:33 PM  Weight 195 lb / 88.45 kg  Height 70 in / 177.8 cm  BP 118/71 mmHg  Pulse 102 /min  Temperature 98.2 F / 36.7 C  BMI 28.0 kg/m   MULTI-SYSTEM PHYSICAL EXAMINATION:    Constitutional: Well-nourished. No physical deformities. Normally developed. Good grooming.  Respiratory: No labored breathing, no use of accessory muscles.   Cardiovascular: Normal temperature, normal extremity pulses, no swelling, no varicosities.     Complexity of Data:  Records Review:   Previous Patient Records  X-Ray Review: C.T. Abdomen/Pelvis: Reviewed Films.     10/30/14  PSA  Total PSA 0.96    Notes:                     CLINICAL DATA: 61 year old male with acute abdominal and pelvic  pain.   EXAM:  CT ABDOMEN AND PELVIS WITHOUT CONTRAST   TECHNIQUE:  Multidetector CT imaging of the  abdomen and pelvis was performed  following the standard protocol without IV contrast.   RADIATION DOSE REDUCTION: This exam was performed according to the  departmental dose-optimization program which includes automated  exposure control, adjustment of the mA and/or kV according to  patient size and/or use of iterative reconstruction technique.   COMPARISON: 03/31/2022 and prior CTs   FINDINGS:  Please note that parenchymal and vascular abnormalities may be  missed as intravenous contrast was not administered.   Lower chest: No acute abnormality.   Hepatobiliary: Equivocal mild contour irregularity of the liver is  noted. No other hepatic abnormalities are identified. The  gallbladder is unremarkable. There is no evidence of intrahepatic or  extrahepatic biliary dilatation.   Pancreas: Unremarkable   Spleen: Splenomegaly again identified.   Adrenals/Urinary Tract: A 9 mm RIGHT UPJ calculus causes moderate to  severe RIGHT hydronephrosis.   An 8 mm LEFT UPJ calculus causes moderate to severe LEFT  hydronephrosis.   Multiple nonobstructing bilateral renal calculi are again  identified. A 5 mm calculus is noted within the dependent portion of  the LEFT renal pelvis.   The adrenal glands and bladder are within normal limits.   Stomach/Bowel: Stomach is within normal limits. Appendix  appears  normal. No evidence of bowel wall thickening, distention, or  inflammatory changes.   Vascular/Lymphatic: Aortic atherosclerosis. No enlarged abdominal or  pelvic lymph nodes.   Reproductive: Prostate is unchanged without definite abnormality.   Other: No ascites, focal collection or pneumoperitoneum.   Musculoskeletal: No acute or suspicious bony abnormalities are  noted. Lumbar spine degenerative changes again noted.   IMPRESSION:  1. Bilateral UPJ calculi (9 mm on the RIGHT and 8 mm on the LEFT)  causing moderate to severe bilateral hydronephrosis.  2. Nonobstructing  bilateral renal calculi. A 5 mm calculus is  present within the LEFT renal pelvis.  3. Equivocal mild contour irregularity of the liver which may  represent cirrhosis.  4. Splenomegaly.  5. Aortic Atherosclerosis (ICD10-I70.0).    Electronically Signed  By: Margarette Canada M.D.  On: 08/15/2022 09:29   PROCEDURES:          Urinalysis w/Scope Dipstick Dipstick Cont'd Micro  Color: Yellow Bilirubin: Neg mg/dL WBC/hpf: >60/hpf  Appearance: Cloudy Ketones: Neg mg/dL RBC/hpf: 20 - 40/hpf  Specific Gravity: 1.020 Blood: 3+ ery/uL Bacteria: Many (>50/hpf)  pH: 6.0 Protein: 2+ mg/dL Cystals: NS (Not Seen)  Glucose: Neg mg/dL Urobilinogen: 0.2 mg/dL Casts: NS (Not Seen)    Nitrites: Neg Trichomonas: Not Present    Leukocyte Esterase: 3+ leu/uL Mucous: Not Present      Epithelial Cells: NS (Not Seen)      Yeast: Moderate (5 - 10/hpf)      Sperm: Not Present    ASSESSMENT:      ICD-10 Details  1 GU:   Ureteral calculus - N20.1    PLAN:           Orders Labs Urine Culture          Schedule Return Visit/Planned Activity: Other See Visit Notes             Note: Will call to schedule surgery.          Document Letter(s):  Created for Patient: Clinical Summary         Notes:   1. Bilateral proximal ureteral and left renal pelvic calculi: We reviewed his situation and I recommended proceeding with bilateral ureteroscopic laser lithotripsy. We will plan to treat both his ureteral calculi and his left renal pelvic stones. We have reviewed this procedure in detail including the potential risks, complications, and expected recovery process. He gives informed consent to proceed. We will plan to do this on a Monday considering his dialysis schedule with plans to have him dialyzed on Tuesday morning assuming there is no concern for fluid overload following his procedure.   CC: Dr. Harrell Gave Guest  Dr. Donato Heinz      * Signed by Jeffrey Ayala, M.D. on 08/26/22 at 4:30 PM (EDT)*

## 2022-09-12 DIAGNOSIS — N186 End stage renal disease: Secondary | ICD-10-CM | POA: Diagnosis not present

## 2022-09-12 DIAGNOSIS — Z23 Encounter for immunization: Secondary | ICD-10-CM | POA: Diagnosis not present

## 2022-09-12 DIAGNOSIS — Z992 Dependence on renal dialysis: Secondary | ICD-10-CM | POA: Diagnosis not present

## 2022-09-12 DIAGNOSIS — N2581 Secondary hyperparathyroidism of renal origin: Secondary | ICD-10-CM | POA: Diagnosis not present

## 2022-09-13 NOTE — Anesthesia Preprocedure Evaluation (Signed)
Anesthesia Evaluation  Patient identified by MRN, date of birth, ID band Patient awake    Reviewed: Allergy & Precautions, NPO status , Patient's Chart, lab work & pertinent test results  Airway Mallampati: III  TM Distance: >3 FB Neck ROM: Full    Dental no notable dental hx. (+) Dental Advisory Given, Implants   Pulmonary neg pulmonary ROS,    Pulmonary exam normal breath sounds clear to auscultation       Cardiovascular hypertension, Past MI: 07/24/2010.  Normal cardiovascular exam+ dysrhythmias Atrial Fibrillation  Rhythm:Regular Rate:Normal  02/23/2022 TTE 1. Left ventricular ejection fraction, by estimation, is 60 to 65%. The  left ventricle has normal function. The left ventricle has no regional  wall motion abnormalities. There is mild left ventricular hypertrophy.  Left ventricular diastolic function  could not be evaluated.  2. Right ventricular systolic function is normal. The right ventricular  size is normal.  3. The mitral valve is normal in structure. No evidence of mitral valve  regurgitation. No evidence of mitral stenosis.  4. The aortic valve is tricuspid. Aortic valve regurgitation is not  visualized. No aortic stenosis is present.  5. The inferior vena cava is dilated in size with <50% respiratory  variability, suggesting right atrial pressure of 15 mmHg.    Neuro/Psych  Headaches, Depression negative psych ROS   GI/Hepatic GERD  Medicated,(+) Hepatitis -  Endo/Other    Renal/GU Dialysis and ESRFRenal diseaseLab Results      Component                Value               Date                      CREATININE               3.10 (H)            08/28/2022              K                        4.0                 08/28/2022                    Musculoskeletal  (+) Arthritis ,   Abdominal   Peds  Hematology  (+) Blood dyscrasia, anemia , Lab Results      HGB                      8.2 (L)              09/09/2022                HCT                      24.6 (L)            09/09/2022         PLT                      209                 09/09/2022              Anesthesia Other Findings All: flomax, codeine  Reproductive/Obstetrics  Anesthesia Physical Anesthesia Plan  ASA: 4  Anesthesia Plan: General   Post-op Pain Management: Dilaudid IV   Induction: Intravenous  PONV Risk Score and Plan: Treatment may vary due to age or medical condition, Midazolam and Ondansetron  Airway Management Planned: LMA  Additional Equipment: None  Intra-op Plan:   Post-operative Plan: Extubation in OR  Informed Consent: I have reviewed the patients History and Physical, chart, labs and discussed the procedure including the risks, benefits and alternatives for the proposed anesthesia with the patient or authorized representative who has indicated his/her understanding and acceptance.     Dental advisory given  Plan Discussed with: CRNA and Anesthesiologist  Anesthesia Plan Comments:       Anesthesia Quick Evaluation

## 2022-09-14 ENCOUNTER — Encounter (HOSPITAL_COMMUNITY): Payer: Self-pay | Admitting: Urology

## 2022-09-14 ENCOUNTER — Ambulatory Visit (HOSPITAL_COMMUNITY): Payer: BC Managed Care – PPO

## 2022-09-14 ENCOUNTER — Encounter (HOSPITAL_COMMUNITY)
Admission: RE | Disposition: A | Discharge status: Home / Self Care | Payer: Self-pay | Source: Home / Self Care | Attending: Urology

## 2022-09-14 ENCOUNTER — Ambulatory Visit (HOSPITAL_COMMUNITY): Payer: BC Managed Care – PPO | Admitting: Physician Assistant

## 2022-09-14 ENCOUNTER — Ambulatory Visit (HOSPITAL_COMMUNITY)
Admission: RE | Admit: 2022-09-14 | Discharge: 2022-09-14 | Disposition: A | Discharge status: Home / Self Care | Payer: BC Managed Care – PPO | Attending: Urology | Admitting: Urology

## 2022-09-14 ENCOUNTER — Ambulatory Visit (HOSPITAL_COMMUNITY): Payer: BC Managed Care – PPO | Admitting: Anesthesiology

## 2022-09-14 DIAGNOSIS — M1990 Unspecified osteoarthritis, unspecified site: Secondary | ICD-10-CM | POA: Diagnosis not present

## 2022-09-14 DIAGNOSIS — I10 Essential (primary) hypertension: Secondary | ICD-10-CM | POA: Diagnosis not present

## 2022-09-14 DIAGNOSIS — I252 Old myocardial infarction: Secondary | ICD-10-CM | POA: Diagnosis not present

## 2022-09-14 DIAGNOSIS — I4891 Unspecified atrial fibrillation: Secondary | ICD-10-CM | POA: Insufficient documentation

## 2022-09-14 DIAGNOSIS — N202 Calculus of kidney with calculus of ureter: Secondary | ICD-10-CM | POA: Diagnosis not present

## 2022-09-14 DIAGNOSIS — Z992 Dependence on renal dialysis: Secondary | ICD-10-CM | POA: Insufficient documentation

## 2022-09-14 DIAGNOSIS — K219 Gastro-esophageal reflux disease without esophagitis: Secondary | ICD-10-CM | POA: Insufficient documentation

## 2022-09-14 DIAGNOSIS — Z79899 Other long term (current) drug therapy: Secondary | ICD-10-CM | POA: Diagnosis not present

## 2022-09-14 DIAGNOSIS — Z01818 Encounter for other preprocedural examination: Secondary | ICD-10-CM

## 2022-09-14 DIAGNOSIS — N186 End stage renal disease: Secondary | ICD-10-CM | POA: Diagnosis not present

## 2022-09-14 DIAGNOSIS — I120 Hypertensive chronic kidney disease with stage 5 chronic kidney disease or end stage renal disease: Secondary | ICD-10-CM | POA: Diagnosis not present

## 2022-09-14 HISTORY — PX: CYSTOSCOPY/URETEROSCOPY/HOLMIUM LASER/STENT PLACEMENT: SHX6546

## 2022-09-14 LAB — CBC WITH DIFFERENTIAL/PLATELET
Abs Immature Granulocytes: 0.05 10*3/uL (ref 0.00–0.07)
Basophils Absolute: 0 10*3/uL (ref 0.0–0.1)
Basophils Relative: 0 %
Eosinophils Absolute: 0 10*3/uL (ref 0.0–0.5)
Eosinophils Relative: 0 %
HCT: 25 % — ABNORMAL LOW (ref 39.0–52.0)
Hemoglobin: 8.2 g/dL — ABNORMAL LOW (ref 13.0–17.0)
Immature Granulocytes: 1 %
Lymphocytes Relative: 21 %
Lymphs Abs: 1.2 10*3/uL (ref 0.7–4.0)
MCH: 34.9 pg — ABNORMAL HIGH (ref 26.0–34.0)
MCHC: 32.8 g/dL (ref 30.0–36.0)
MCV: 106.4 fL — ABNORMAL HIGH (ref 80.0–100.0)
Monocytes Absolute: 0.5 10*3/uL (ref 0.1–1.0)
Monocytes Relative: 9 %
Neutro Abs: 3.7 10*3/uL (ref 1.7–7.7)
Neutrophils Relative %: 69 %
Platelets: 183 10*3/uL (ref 150–400)
RBC: 2.35 MIL/uL — ABNORMAL LOW (ref 4.22–5.81)
RDW: 18.9 % — ABNORMAL HIGH (ref 11.5–15.5)
WBC: 5.5 10*3/uL (ref 4.0–10.5)
nRBC: 0.4 % — ABNORMAL HIGH (ref 0.0–0.2)

## 2022-09-14 LAB — BASIC METABOLIC PANEL
Anion gap: 12 (ref 5–15)
BUN: 20 mg/dL (ref 8–23)
CO2: 24 mmol/L (ref 22–32)
Calcium: 8.7 mg/dL — ABNORMAL LOW (ref 8.9–10.3)
Chloride: 103 mmol/L (ref 98–111)
Creatinine, Ser: 3.08 mg/dL — ABNORMAL HIGH (ref 0.61–1.24)
GFR, Estimated: 22 mL/min — ABNORMAL LOW (ref >60)
Glucose, Bld: 107 mg/dL — ABNORMAL HIGH (ref 70–99)
Potassium: 3.5 mmol/L (ref 3.5–5.1)
Sodium: 139 mmol/L (ref 135–145)

## 2022-09-14 SURGERY — CYSTOSCOPY/URETEROSCOPY/HOLMIUM LASER/STENT PLACEMENT
Anesthesia: General | Laterality: Bilateral

## 2022-09-14 MED ORDER — LIDOCAINE HCL (CARDIAC) PF 100 MG/5ML IV SOSY
PREFILLED_SYRINGE | INTRAVENOUS | Status: DC | PRN
  Administered 2022-09-14: 100 mg via INTRATRACHEAL

## 2022-09-14 MED ORDER — OXYCODONE HCL 5 MG PO TABS: 5.0000 mg | ORAL_TABLET | Freq: Once | ORAL | Status: DC | PRN

## 2022-09-14 MED ORDER — SODIUM CHLORIDE 0.9 % IV SOLN
2.0000 g | Freq: Once | INTRAVENOUS | Status: AC
  Administered 2022-09-14: 2 g via INTRAVENOUS
  Filled 2022-09-14: qty 20

## 2022-09-14 MED ORDER — PHENYLEPHRINE HCL-NACL 20-0.9 MG/250ML-% IV SOLN
INTRAVENOUS | Status: DC | PRN
  Administered 2022-09-14: 30 ug/min via INTRAVENOUS

## 2022-09-14 MED ORDER — PROPOFOL 10 MG/ML IV BOLUS
INTRAVENOUS | Status: DC | PRN
  Administered 2022-09-14: 130 mg via INTRAVENOUS

## 2022-09-14 MED ORDER — ACETAMINOPHEN 10 MG/ML IV SOLN: 1000.0000 mg | Freq: Once | INTRAVENOUS | Status: DC | PRN

## 2022-09-14 MED ORDER — OXYCODONE HCL 5 MG/5ML PO SOLN: 5.0000 mg | Freq: Once | ORAL | Status: DC | PRN

## 2022-09-14 MED ORDER — PHENYLEPHRINE HCL (PRESSORS) 10 MG/ML IV SOLN
INTRAVENOUS | Status: AC
  Filled 2022-09-14: qty 1

## 2022-09-14 MED ORDER — SODIUM CHLORIDE 0.9 % IR SOLN
Status: DC | PRN
  Administered 2022-09-14: 3000 mL

## 2022-09-14 MED ORDER — FENTANYL CITRATE (PF) 100 MCG/2ML IJ SOLN
INTRAMUSCULAR | Status: AC
  Filled 2022-09-14: qty 2

## 2022-09-14 MED ORDER — DEXAMETHASONE SODIUM PHOSPHATE 10 MG/ML IJ SOLN
INTRAMUSCULAR | Status: DC | PRN
  Administered 2022-09-14: 10 mg via INTRAVENOUS

## 2022-09-14 MED ORDER — ONDANSETRON HCL 4 MG/2ML IJ SOLN: 4.0000 mg | Freq: Once | INTRAMUSCULAR | Status: DC | PRN

## 2022-09-14 MED ORDER — PHENYLEPHRINE 80 MCG/ML (10ML) SYRINGE FOR IV PUSH (FOR BLOOD PRESSURE SUPPORT)
PREFILLED_SYRINGE | INTRAVENOUS | Status: DC | PRN
  Administered 2022-09-14 (×2): 160 ug via INTRAVENOUS
  Administered 2022-09-14: 80 ug via INTRAVENOUS
  Administered 2022-09-14: 240 ug via INTRAVENOUS
  Administered 2022-09-14: 160 ug via INTRAVENOUS

## 2022-09-14 MED ORDER — CHLORHEXIDINE GLUCONATE 0.12 % MT SOLN
15.0000 mL | Freq: Once | OROMUCOSAL | Status: AC
  Administered 2022-09-14: 15 mL via OROMUCOSAL

## 2022-09-14 MED ORDER — 0.9 % SODIUM CHLORIDE (POUR BTL) OPTIME
TOPICAL | Status: DC | PRN
  Administered 2022-09-14: 1000 mL

## 2022-09-14 MED ORDER — SODIUM CHLORIDE 0.9 % IV SOLN: INTRAVENOUS | Status: DC

## 2022-09-14 MED ORDER — FLUCONAZOLE IN SODIUM CHLORIDE 200-0.9 MG/100ML-% IV SOLN
200.0000 mg | Freq: Once | INTRAVENOUS | Status: AC
  Administered 2022-09-14: 200 mg via INTRAVENOUS
  Filled 2022-09-14: qty 100

## 2022-09-14 MED ORDER — MIDAZOLAM HCL 2 MG/2ML IJ SOLN
INTRAMUSCULAR | Status: AC
  Filled 2022-09-14: qty 2

## 2022-09-14 MED ORDER — HYDROMORPHONE HCL 1 MG/ML IJ SOLN: 0.2500 mg | INTRAMUSCULAR | Status: DC | PRN

## 2022-09-14 MED ORDER — ONDANSETRON HCL 4 MG/2ML IJ SOLN
INTRAMUSCULAR | Status: DC | PRN
  Administered 2022-09-14: 4 mg via INTRAVENOUS

## 2022-09-14 MED ORDER — FENTANYL CITRATE (PF) 100 MCG/2ML IJ SOLN
INTRAMUSCULAR | Status: DC | PRN
  Administered 2022-09-14 (×3): 25 ug via INTRAVENOUS

## 2022-09-14 SURGICAL SUPPLY — 25 items
BAG COUNTER SPONGE SURGICOUNT (BAG) IMPLANT
BAG SPNG CNTER NS LX DISP (BAG)
BAG URO CATCHER STRL LF (MISCELLANEOUS) ×1 IMPLANT
BASKET ZERO TIP NITINOL 2.4FR (BASKET) IMPLANT
BSKT STON RTRVL ZERO TP 2.4FR (BASKET) ×1
CATH URETL OPEN END 6FR 70 (CATHETERS) IMPLANT
CLOTH BEACON ORANGE TIMEOUT ST (SAFETY) ×1 IMPLANT
GLOVE SURG LX STRL 7.5 STRW (GLOVE) ×1 IMPLANT
GOWN STRL REUS W/ TWL XL LVL3 (GOWN DISPOSABLE) ×1 IMPLANT
GOWN STRL REUS W/TWL XL LVL3 (GOWN DISPOSABLE) ×2
GUIDEWIRE STR DUAL SENSOR (WIRE) ×1 IMPLANT
GUIDEWIRE ZIPWRE .038 STRAIGHT (WIRE) IMPLANT
IV NS 1000ML (IV SOLUTION) ×1
IV NS 1000ML BAXH (IV SOLUTION) ×1 IMPLANT
KIT TURNOVER KIT A (KITS) IMPLANT
LASER FIB FLEXIVA PULSE ID 365 (Laser) IMPLANT
MANIFOLD NEPTUNE II (INSTRUMENTS) ×1 IMPLANT
PACK CYSTO (CUSTOM PROCEDURE TRAY) ×1 IMPLANT
SHEATH NAVIGATOR HD 11/13X36 (SHEATH) IMPLANT
SHEATH NAVIGATOR HD 12/14X36 (SHEATH) IMPLANT
STENT URET 6FRX26 CONTOUR (STENTS) IMPLANT
TRACTIP FLEXIVA PULS ID 200XHI (Laser) IMPLANT
TRACTIP FLEXIVA PULSE ID 200 (Laser) ×1
TUBING CONNECTING 10 (TUBING) ×1 IMPLANT
TUBING UROLOGY SET (TUBING) ×1 IMPLANT

## 2022-09-14 NOTE — Anesthesia Postprocedure Evaluation (Signed)
Anesthesia Post Note  Patient: Jeffrey Ayala  Procedure(s) Performed: CYSTOSCOPY/URETEROSCOPY/HOLMIUM LASER/STENT PLACEMENT (Bilateral)     Patient location during evaluation: PACU Anesthesia Type: General Level of consciousness: awake and alert Pain management: pain level controlled Vital Signs Assessment: post-procedure vital signs reviewed and stable Respiratory status: spontaneous breathing, nonlabored ventilation, respiratory function stable and patient connected to nasal cannula oxygen Cardiovascular status: blood pressure returned to baseline and stable Postop Assessment: no apparent nausea or vomiting Anesthetic complications: no   No notable events documented.  Last Vitals:  Vitals:   09/14/22 1035 09/14/22 1045  BP: 118/70 128/82  Pulse: (!) 110 96  Resp: 20   Temp:    SpO2: 96% 94%    Last Pain:  Vitals:   09/14/22 1035  TempSrc:   PainSc: 0-No pain                 Barnet Glasgow

## 2022-09-14 NOTE — Op Note (Signed)
Preoperative diagnosis: Bilateral ureteral and left renal calculi  Postoperative diagnosis: Bilateral ureteral and left renal calculi  Procedure:  Cystoscopy Bilateral ureteroscopy and stone removal Ureteroscopic laser lithotripsy Bilateral ureteral stent placement (6 x 26 - no string)  Surgeon: Roxy Horseman, Brooke Bonito. M.D.  Anesthesia: General  Complications: None  EBL: Minimal  Specimens: Left ureteral and renal calculi Right ureteral calculi  Disposition of specimens: [facility] for stone analysis  Indication: Jeffrey Ayala  is a 61 y.o. patient with urolithiasis.  He recently presented with bilateral ureteral stones and obstruction and concerns for sepsis.  He underwent urgent bilateral ureteral stenting and was treated with appropriate antibiotic therapy.  He subsequently presented for definitive management of his bilateral ureteral stones and his left renal calculus.  After reviewing the management options for treatment, they elected to proceed with the above surgical procedure(s). We have discussed the potential benefits and risks of the procedure, side effects of the proposed treatment, the likelihood of the patient achieving the goals of the procedure, and any potential problems that might occur during the procedure or recuperation. Informed consent has been obtained.  Description of procedure:  The patient was taken to the operating room and general anesthesia was induced.  The patient was placed in the dorsal lithotomy position, prepped and draped in the usual sterile fashion, and preoperative antibiotics were administered. A preoperative time-out was performed.   Cystourethroscopy was performed.  The patient's urethra was examined and was normal. The bladder was then systematically examined in its entirety. There was no evidence for any bladder tumors, stones, or other mucosal pathology.    Attention then turned to the left ureteral orifice and flexible  graspers were used to bring the left ureteral stent out to the urethral meatus.  The stone was easily visualized in the proximal ureter on fluoroscopy.  A 0.38 sensor guidewire was then advanced up the left ureter into the renal pelvis under fluoroscopic guidance.  A 12/14 Fr ureteral access sheath was then advanced over the guide wire to a position below the identified stone in the proximal ureter. The digital flexible ureteroscope was then advanced through the access sheath into the ureter next to the guidewire and the calculus was identified and was located in the proximal ureter.     The stone was then fragmented with the 200 micron holmium laser fiber on a setting of 0.6 J and frequency of 6 Hz.     All sizable stones were then removed with a zero tip nitinol basket.  In addition, the patient's known left lower pole renal calculus was identified and was able to be removed intact.  Reinspection of the ureter/renal pelvis revealed no remaining visible stones or fragments of significant size.   The wire was then left in place and the ureteral access sheath was removed.  The wire was backloaded on the cystoscope and a 6 x 26 double-J ureteral stent was advanced over the wire and positioned appropriately under fluoroscopic and cystoscopic guidance.  The wire was removed with a good curl noted in the renal pelvis as well as the bladder.  Attention then turned to the contralateral side.  Again, flexible graspers were used to bring the right ureteral stent out to the urethral meatus.  A 0.38 sensor guidewire was then advanced up the right ureter into the renal pelvis under fluoroscopic guidance.  A 12/14 Fr ureteral access sheath was then advanced over the guide wire to a point just below the stone which  was again located in the proximal ureter. The digital flexible ureteroscope was then advanced through the access sheath into the ureter next to the guidewire and the calculus was identified and was located  in the proximal ureter.   The stone was then fragmented with the 200 micron holmium laser fiber on a setting of 0.6 J and frequency of 6 Hz.  The stone was pushed into the renal collecting system but was able to be further fragmented.  All sizable stones were then removed with a zero tip nitinol basket.  Reinspection of the ureter/renal pelvis revealed no remaining visible stones or fragments of significant size.   The safety wire was then replaced and the access sheath removed.  The guidewire was backloaded through the cystoscope and a 6 x 26 double-J ureteral stent was advance over the wire using Seldinger technique.  The stent was positioned appropriately under fluoroscopic and cystoscopic guidance.  The wire was then removed with an adequate stent curl noted in the renal pelvis as well as in the bladder.  The bladder was then emptied and the procedure ended.  The patient appeared to tolerate the procedure well and without complications.  The patient was able to be awakened and transferred to the recovery unit in satisfactory condition.   Pryor Curia MD

## 2022-09-14 NOTE — Transfer of Care (Signed)
Immediate Anesthesia Transfer of Care Note  Patient: Jeffrey Ayala  Procedure(s) Performed: CYSTOSCOPY/URETEROSCOPY/HOLMIUM LASER/STENT PLACEMENT (Bilateral)  Patient Location: PACU  Anesthesia Type:General  Level of Consciousness: awake, drowsy and patient cooperative  Airway & Oxygen Therapy: Patient connected to face mask oxygen  Post-op Assessment: Report given to RN and Post -op Vital signs reviewed and stable  Post vital signs: stable  Last Vitals:  Vitals Value Taken Time  BP 127/75 09/14/22 1001  Temp 37.1 C 09/14/22 1001  Pulse 101 09/14/22 1001  Resp 12 09/14/22 1002  SpO2    Vitals shown include unvalidated device data.  Last Pain:  Vitals:   09/14/22 0717  TempSrc:   PainSc: 0-No pain         Complications: No notable events documented.

## 2022-09-14 NOTE — OR Nursing (Signed)
Stones taken by Dr. Alinda Money.

## 2022-09-14 NOTE — Interval H&P Note (Signed)
History and Physical Interval Note:  09/14/2022 6:57 AM  Jeffrey Ayala  has presented today for surgery, with the diagnosis of BILATERAL URETERAL AND RENAL CALCULI.  The various methods of treatment have been discussed with the patient and family. After consideration of risks, benefits and other options for treatment, the patient has consented to  Procedure(s): CYSTOSCOPY/URETEROSCOPY/HOLMIUM LASER/STENT PLACEMENT (Bilateral) as a surgical intervention.  The patient's history has been reviewed, patient examined, no change in status, stable for surgery.  I have reviewed the patient's chart and labs.  Questions were answered to the patient's satisfaction.     [employer]

## 2022-09-14 NOTE — Anesthesia Procedure Notes (Signed)
Procedure Name: LMA Insertion Date/Time: 09/14/2022 8:39 AM  Performed by: Pilar Grammes, CRNAPre-anesthesia Checklist: Patient identified, Emergency Drugs available, Suction available, Patient being monitored and Timeout performed Patient Re-evaluated:Patient Re-evaluated prior to induction Oxygen Delivery Method: Circle system utilized Preoxygenation: Pre-oxygenation with 100% oxygen Induction Type: IV induction Ventilation: Mask ventilation without difficulty LMA: LMA with gastric port inserted LMA Size: 5.0 Number of attempts: 1 Tube secured with: Tape Dental Injury: Teeth and Oropharynx as per pre-operative assessment

## 2022-09-14 NOTE — Discharge Instructions (Signed)
You may see some blood in the urine and may have some burning with urination for 48-72 hours. You also may notice that you have to urinate more frequently or urgently after your procedure which is normal.  °You should call should you develop an inability urinate, fever > 101, persistent nausea and vomiting that prevents you from eating or drinking to stay hydrated.  °If you have a stent, you will likely urinate more frequently and urgently until the stent is removed and you may experience some discomfort/pain in the lower abdomen and flank especially when urinating. You may take pain medication prescribed to you if needed for pain. You may also intermittently have blood in the urine until the stent is removed. ° °

## 2022-09-15 ENCOUNTER — Encounter (HOSPITAL_COMMUNITY): Payer: Self-pay | Admitting: Urology

## 2022-09-16 ENCOUNTER — Encounter: Payer: Self-pay | Admitting: Physical Therapy

## 2022-09-16 ENCOUNTER — Ambulatory Visit (INDEPENDENT_AMBULATORY_CARE_PROVIDER_SITE_OTHER): Payer: BC Managed Care – PPO | Admitting: Physical Therapy

## 2022-09-16 DIAGNOSIS — L98498 Non-pressure chronic ulcer of skin of other sites with other specified severity: Secondary | ICD-10-CM

## 2022-09-16 DIAGNOSIS — R2681 Unsteadiness on feet: Secondary | ICD-10-CM | POA: Diagnosis not present

## 2022-09-16 DIAGNOSIS — R2689 Other abnormalities of gait and mobility: Secondary | ICD-10-CM

## 2022-09-16 DIAGNOSIS — M6281 Muscle weakness (generalized): Secondary | ICD-10-CM | POA: Diagnosis not present

## 2022-09-16 DIAGNOSIS — M21372 Foot drop, left foot: Secondary | ICD-10-CM

## 2022-09-16 NOTE — Therapy (Signed)
OUTPATIENT PHYSICAL THERAPY TREATMENT NOTE  Patient Name: Jeffrey Ayala MRN: [id number] DOB:[birth year], 61 y.o., male Today's Date: 09/16/2022  PCP: [name], NP-C REFERRING PROVIDER: [name], MD   END OF SESSION:   PT End of Session - 09/16/22 0844     Visit Number 14    Number of Visits 20    Date for PT Re-Evaluation 11/20/22    Authorization Type [employer] comm PPO    Authorization Time Period 20 visit limit PT/OT/chiro, $35 co-pay    Authorization - Visit Number 19    Authorization - Number of Visits 20    Progress Note Due on Visit 46    PT Start Time 0844    PT Stop Time 0930    PT Time Calculation (min) 46 min    Equipment Utilized During Treatment Gait belt    Activity Tolerance Patient tolerated treatment well;Patient limited by fatigue    Behavior During Therapy WFL for tasks assessed/performed;Flat affect                 Past Medical History:  Diagnosis Date   A-fib ([place])    Anemia    Arthritis    Atrial fibrillation, chronic ([name])    not on [facility]   Cardiac arrest ([place]) 02/2022   in the setting of septic shock   Cirrhosis of liver ([place])    Concussion    Eosinophilic esophagitis    EGD/bx by Dr. Havery Moros   ESRD needing dialysis [facility])    GERD (gastroesophageal reflux disease)    History of gastric ulcer    History of kidney stones    Intermittent dysphagia    Scrotal infection 02/2022   Septic shock ([place]) 02/2022   Vasoplegia - > L TMA and R BKA   Past Surgical History:  Procedure Laterality Date   AMPUTATION Right 03/15/2022   Procedure: RIGHT BELOW KNEE AMPUTATION;  Surgeon: [name], MD;  Location: [place];  Service: Orthopedics;  Laterality: Right;   AV FISTULA PLACEMENT Left 08/28/2022   Procedure: LEFT ARM ARTERIOVENOUS (AV) FISTULA CREATION;  Surgeon: Waynetta Sandy, MD;  Location: [place];  Service: Vascular;  Laterality: Left;   CYSTOSCOPY     CYSTOSCOPY WITH STENT PLACEMENT Bilateral 08/15/2022   Procedure:  CYSTOSCOPY WITH STENT PLACEMENT;  Surgeon: Remi Haggard, MD;  Location: [place];  Service: Urology;  Laterality: Bilateral;   CYSTOSCOPY/URETEROSCOPY/HOLMIUM LASER/STENT PLACEMENT Bilateral 09/14/2022   Procedure: CYSTOSCOPY/URETEROSCOPY/HOLMIUM LASER/STENT PLACEMENT;  Surgeon: Raynelle Bring, MD;  Location: [facility];  Service: Urology;  Laterality: Bilateral;   DENTAL SURGERY     FINGER SURGERY     HERNIA REPAIR     at age 66   IR FLUORO GUIDE CV LINE RIGHT  03/09/2022   IR PATIENT EVAL TECH 0-60 MINS  03/09/2022   IR US GUIDE VASC ACCESS RIGHT  03/09/2022   KNEE ARTHROSCOPY Left    LITHOTRIPSY     NEPHROLITHOTOMY     SCROTAL EXPLORATION N/A 03/16/2022   Procedure: IRRIGATION AND DEBRIDEMENT SCROTUM;  Surgeon: Raynelle Bring, MD;  Location: [place];  Service: Urology;  Laterality: N/A;   SCROTAL EXPLORATION N/A 03/28/2022   Procedure: IRRIGATION AND DEBRIDEMENT SCROTUM;  Surgeon: Ardis Hughs, MD;  Location: [place];  Service: Urology;  Laterality: N/A;   TONSILLECTOMY     TOTAL KNEE ARTHROPLASTY Bilateral 11/04/2018   Procedure: BILATERAL TOTAL KNEE ARTHROPLASTY;  Surgeon: [name], MD;  Location: [place];  Service: Orthopedics;  Laterality:  Bilateral;  spinal/epidural per anesthesiologist   TRANSMETATARSAL AMPUTATION Left 03/15/2022   Procedure: LEFT TRANSMETATARSAL AMPUTATION;  Surgeon: [name], MD;  Location: [place];  Service: Orthopedics;  Laterality: Left;   Patient Active Problem List   Diagnosis Date Noted   Acute bilateral obstructive uropathy 08/15/2022   Skin breakdown 07/29/2022   Suicidal ideations 06/09/2022   Chills (without fever) 06/09/2022   Severe depression ([place]) 06/09/2022   Acute cough 06/09/2022   End stage renal disease ([place]) 06/02/2022   Reactive depression 05/31/2022   Belching 05/27/2022   Gastroesophageal reflux disease 05/27/2022   Atrial fibrillation ([place]) 05/20/2022   Long term (current) use of anticoagulants 05/20/2022   S/P transmetatarsal  amputation of foot, left ([place]) 04/18/2022   CKD (chronic kidney disease) stage 5, GFR less than 15 ml/min ([place]) 04/18/2022   Secondary hyperparathyroidism of renal origin ([place]) 04/10/2022   Pruritus, unspecified 04/10/2022   Nausea 04/10/2022   Iron deficiency anemia, unspecified 04/10/2022   Hypothyroidism, unspecified 04/10/2022   Other acute kidney failure ([place]) 04/10/2022   Dyspnea, unspecified 61/60/7371   Eosinophilic esophagitis 05/18/9484   Gastric ulcer, unspecified as acute or chronic, without hemorrhage or perforation 04/10/2022   Coagulation defect, unspecified ([place]) 04/10/2022   Allergy, unspecified, initial encounter 04/10/2022   Anaphylactic shock, unspecified, initial encounter 04/10/2022   Arthralgia of right lower leg    Acute blood loss anemia    Unilateral complete BKA, right, initial encounter ([place]) 04/03/2022   S/P BKA (below knee amputation), right ([place]) 04/03/2022   Old myocardial infarction 04/02/2022   Dependence on renal dialysis ([place]) 04/02/2022   Acquired absence of right leg below knee ([place]) 04/02/2022   Cirrhosis ([place]) 03/31/2022   Fournier's gangrene in male    Scrotal infection 03/25/2022   Normocytic anemia 03/25/2022   Diarrhea 03/25/2022   Shock liver 03/25/2022   Acute respiratory failure with hypoxia ([place]) 03/25/2022   Gout 03/25/2022   Acute nonintractable headache 03/25/2022   Demand ischemia 03/25/2022   Thrombocytopenia ([place]) 03/25/2022   Obesity, Class III, BMI 40-49.9 (morbid obesity) ([place]) 03/25/2022   Cardiac arrest ([place]) 03/25/2022   Hypotension    Respiratory insufficiency    Cellulitis    Malnutrition of moderate degree 03/12/2022   Altered mental status    Gangrene of right foot ([place])    Gangrene of left foot ([place])    Severe protein-calorie malnutrition ([place]) 03/10/2022   Encounter for central line placement    Pressure injury of skin 02/23/2022   Septic shock ([place]) 02/22/2022   AKI (acute kidney injury) ([place])     Generalized abdominal pain    Lactic acidosis    Atrial fibrillation with RVR ([name])    Hypokalemia    Hypomagnesemia    Pain in finger of right hand 08/14/2021   Degenerative joint disease of shoulder region 05/07/2020   Olecranon bursitis of right elbow 05/07/2020   Pain in joint of right shoulder 04/26/2020   S/P TKR (total knee replacement), bilateral 11/04/2018   Bilateral primary osteoarthritis of knee    DDD (degenerative disc disease), cervical 01/10/2018   Rupture of medial head of gastrocnemius, left, sequela 01/14/2017   Kidney stones 06/22/2016   Essential hypertension 08/01/2010   DYSTHYMIC DISORDER 07/08/2010   PALPITATIONS 07/08/2010    REFERRING DIAG: Z89.511 (ICD-10-CM) - Hx of below knee amputation, right   ONSET DATE: 06/19/2022 Prosthesis delivery  THERAPY DIAG:  Other abnormalities of gait and mobility  Muscle weakness (generalized)  Unsteadiness on feet  Non-pressure  chronic ulcer of skin of other sites with other specified severity ([place])  Foot drop, left  Rationale for Evaluation and Treatment Rehabilitation  PERTINENT HISTORY: Rt BKA 03/15/22, Left MTA 03/15/2022, bil. TKA 11/04/18, ESRD / chronic kidney disease st5 dialysis Tues/Thurs/Sat, cirrhosis, cardiac arrest May 23 post intubation, A-Fib  PRECAUTIONS: Fall, BP only in RUE starting 10/6  SUBJECTIVE:  He had fistula in LUE last week.  No restrictions except no BP LUE, no injections, etc.  He was told yesterday that he does not need dialysis.  He has been trying to walk more including cane in house with wife assist or touching wall in hall.   PAIN:  Are you having pain? Distal residual limb  0/10 when sitting & standing 0/10 with gait    OBJECTIVE: (objective measures completed at initial evaluation unless otherwise dated)    COGNITION: 06/22/2022  Overall cognitive status: Within functional limits for tasks assessed and pt had flat affect so unable to fully assess cognition.   POSTURE:  06/22/2022  rounded shoulders, forward head, flexed trunk , and weight shift left   LOWER EXTREMITY ROM:   ROM P:passive  A:active Right Eval 06/22/2022 Left Eval 06/22/2022  Hip flexion      Hip extension Standing A: ~30* limitation Standing A: ~30* limitation  Hip abduction      Hip adduction      Hip internal rotation      Hip external rotation      Knee flexion      Knee extension Standing A: ~15* limitation Standing A: ~15* limitation  Ankle dorsiflexion   Standing A: ~25* limitation  Ankle plantarflexion      Ankle inversion      Ankle eversion       (Blank rows = not tested)   LOWER EXTREMITY MMT:   MMT Right Eval 06/22/2022 Left Eval 06/22/2022  Hip flexion 4/5 4/5  Hip extension 2/5 functional 2/5 functional  Hip abduction 2/5 functional 2/5 functional  Hip adduction      Hip internal rotation      Hip external rotation      Knee flexion      Knee extension 3-/5 functional 3-/5 functional  Ankle dorsiflexion 2-/5    Ankle plantarflexion      Ankle inversion      Ankle eversion      (Blank rows = not tested)   TRANSFERS: 06/22/2022  Sit to stand: Mod A and from 22" w/c requiring armrests to push & RW to stabilize 06/22/2022  Stand to sit: Min A from RW to 22" w/c unable to release RW to reach for w/c until seated.   GAIT: 06/22/2022 Gait pattern: step to pattern, decreased step length- Left, decreased stance time- Right, decreased stride length, decreased hip/knee flexion- Right, circumduction- Right, Right hip hike, knee flexed in stance- Right, knee flexed in stance- Left, antalgic, trunk flexed, abducted- Right, and poor foot clearance- Left excessive weightbearing BUEs on RW Distance walked: 5' Assistive device utilized: Environmental consultant - 2 wheeled and right TTA prosthesis / left plate & shoe filler Level of assistance: Mod A Comments: post gait HR 82, SpO2 99% with dyspnea noted   FUNCTIONAL TESTs:  08/24/2022 Merrilee Jansky Balance 12/56     06/22/2022  Static  standing balance with BUE support on RW for 1 min with minA.     CURRENT PROSTHETIC WEAR ASSESSMENT: 06/22/2022 Patient is dependent with: skin check, residual limb care, care of non-amputated limb, prosthetic cleaning, ply sock cleaning, correct  ply sock adjustment, proper wear schedule/adjustment, and proper weight-bearing schedule/adjustment Donning prosthesis: Total A and max cues required, Doffing prosthesis: Total A and wife able to doffe Prosthetic wear tolerance: 1-2 hours 1-2x/day, for 3 of 3 days since delivery.  Prosthetic weight bearing tolerance: 0 minutes unable to stand outside of PT Edema: pitting with >5sec capillary refill Residual limb condition: 34m wound with serous drainage & black scab present,  calf measurement is 42.2cm Prosthetic description:  silicon liner with pin suspension, flexible inner socket Total Contact design, dynamic response foot with hydraulic PF/DF K code/activity level with prosthetic use: Level 3     TODAY'S TREATMENT:  09/16/2022 Prosthetic Training with TTA prosthesis: Wound healed & no pain reported PT reviewed donning suction suspension to get it all the way up. Pt verbalized understanding. PT instructed in sit to/from stand technique with demo.  Pt performed 10 reps pushing off 18" chair seat (no armrests used) and stabilizing without UE support as goal.  He required occasional touch to stabilize or use of back of legs against stable chair.  PT educated on how to set up with recommendation 10 reps 2-3 times per day.  Standing trunk flexion reaching under control to chair bottom anterior then reaching one arm to floor. Alternating lead UE and UE reaching to floor. PT recommended 5 reps per UE lead 1-2 x/day. Pt ambulated 150' with cane with min guard & no HHA. Verbal cues on upright posture looking forward. PT instructed in wife assisting without HHA.  If walking in hall keep right side near wall but do not touch unless off balance.  PT  recommended walking program with wife in parking lot or inside church if weather indicates working on increasing distance with sets.  PT demo technique for backwards & sidestepping gait.  Pt performed 10' ea with cane with minA.  Pt & wife verbalized understanding of above activities for HEP.    09/02/2022 Prosthetic Training with TTA prosthesis: Wound appears healed.  He continues with under liner as he reports more comfortable.  PT educated on sweat & need to switch if damp. Pt & wife verbalized understanding.  PT reviewed donning vac pin system with recommendation to lean posterior pelvis against counter when adjusting sleeve standing.  Pt arrived amb with his RW for first time today.  PT adjusted height to facilitate upright posture & pt reports feels better.  Pt amb 25' with cane stand alone tip & HHA with minA.  PT instructed pt & wife how to provide HHA. Pt amb 25', 428 & 511 with wife assisting & PT supervising with cane & HHA.  PT demo & verbal cues on sidestepping & walking backwards with TTA prosthesis. Pt performed with RUE on counter & LUE cane with supervision.  Pt & wife report how to work on technique at home.    08/24/2022 Prosthetic Training with TTA prosthesis: Wound on residual limb is continuing to heal and is flat across without pinprick appearance, PT advised to continue with Vivewear under liner & continuous monitoring of raised scab or suture expulsion and pt and wife verbalized understanding. PT demo & verbal cues on donning new Vacuum Pin System. Pt verbalized & return understanding.  New socket / prosthesis weighs 3.6kg. pt verbalized understanding to adjust if he wears to dialysis. We will need to reweigh it when prosthetist switches socket to laminated version. Pt verbalized understanding.  Pt ambulated 150' with RW safely.  PT recommended not sitting in w/c in home when wearing prosthesis  to facilitate mobility by walking with RW.  Pt neg ramp & curb with RW with  supervision. PT recommended entering /exiting their home with wife supervision and initiate community mobility ambulating with wife following with w/c. Pt & wife verbalized understanding.  Pt amb 20' including 180* turn with cane stand alone tip with modA for balance issues.     HOME EXERCISE PROGRAM:  AT [facility] FIND YOUR MIDLINE POSITION AND PLACE FEET EQUAL DISTANCE FROM THE MIDLINE.  Try to find this position when standing still for activities.   USE TAPE ON FLOOR TO MARK THE MIDLINE POSITION which is even with middle of sink.  You also should try to feel with your limb pressure in socket.  You are trying to feel with limb what you used to feel with the bottom of your foot.  Side to Side Shift: Moving your hips only (not shoulders): move weight onto your left leg, HOLD/FEEL pressure in socket.  Move back to equal weight on each leg, HOLD/FEEL pressure in socket. Move weight onto your right leg, HOLD/FEEL pressure in socket. Move back to equal weight on each leg, HOLD/FEEL pressure in socket. Repeat.  Start with both hands on sink, progress to hand on prosthetic side only, then no hands.  Front to Back Shift: Moving your hips only (not shoulders): move your weight forward onto your toes, HOLD/FEEL pressure in socket. Move your weight back to equal Flat Foot on both legs, HOLD/FEEL  pressure in socket. Move your weight back onto your heels, HOLD/FEEL  pressure in socket. Move your weight back to equal on both legs, HOLD/FEEL  pressure in socket. Repeat.  Start with both hands on sink, progress to hand on prosthetic side only, then no hands.  Moving Cones / Cups: With equal weight on each leg: Hold on with one hand the first time, then progress to no hand supports. Move cups from one side of sink to the other. Place cups ~2" out of your reach, progress to 10" beyond reach.  Place one hand in middle of sink and reach with other hand. Do both arms.  Then hover one hand and move cups with other hand.   Overhead/Upward Reaching: alternated reaching up to top cabinets or ceiling if no cabinets present. Keep equal weight on each leg. Start with one hand support on counter while other hand reaches and progress to no hand support with reaching.  ace one hand in middle of sink and reach with other hand. Do both arms.  Then hover one hand and move cups with other hand.  5.   Looking Over Shoulders: With equal weight on each leg: alternate turning to look over your shoulders with one hand support on counter as needed.  Start with head motions only to look in front of shoulder, then even with shoulder and progress to looking behind you. To look to side, move head /eyes, then shoulder on side looking pulls back, shift more weight to side looking and pull hip back. Place one hand in middle of sink and let go with other hand so your shoulder can pull back. Switch hands to look other way.   Then hover one hand and look over shoulder. If looking right, use left hand at sink. If looking left, use right hand at sink. 6.  Stepping with leg that is not amputated:  Move items under cabinet out of your way. Shift your hips/pelvis so weight on prosthesis. Tighten muscles in hip on prosthetic side.  SLOWLY step  other leg so front of foot is in cabinet. Then step back to floor.   Pt also recommending sitting at edge of w/c with feet supported on floor but no back support each hour in w/c until back fatigues. PT also demo & instructed in sitting on 24" stool. Pt & wife verbalized understanding.     ASSESSMENT:   CLINICAL IMPRESSION: PT progressed difficulty of gait & balance activities which challenged him.  Pt & wife appear to understand how to perform activities at home to continue progression.  He continues to benefit from skilled PT.     OBJECTIVE IMPAIRMENTS Abnormal gait, cardiopulmonary status limiting activity, decreased activity tolerance, decreased balance, decreased endurance, decreased knowledge of condition,  decreased knowledge of use of DME, decreased mobility, difficulty walking, decreased ROM, decreased strength, increased edema, impaired flexibility, postural dysfunction, and prosthetic dependency .    ACTIVITY LIMITATIONS carrying, lifting, bending, sitting, standing, squatting, stairs, transfers, locomotion level, and prosthesis use   PARTICIPATION LIMITATIONS: driving, community activity, occupation, yard work, and recreational of camping   [employer], Past/current experiences, Time since onset of injury/illness/exacerbation, and 3+ comorbidities: see PMH  are also affecting patient's functional outcome.    REHAB POTENTIAL: Good   CLINICAL DECISION MAKING: Evolving/moderate complexity   EVALUATION COMPLEXITY: Moderate     GOALS: Goals reviewed with patient? Yes   SHORT TERM GOALS: Target date: 09/23/2022   Patient donnes new prosthesis modified independent & verbalizes adjusting ply socks.  Goal status: Set 08/24/2022 2.  Patient tolerates prosthesis >90% awake hrs /day without increase in skin issues & no limb pain. Goal status: Set 08/24/2022   3.  Patient able to stand 2 min without UE support with supervision. Goal status: Set 08/24/2022   4. Patient ambulates 53' with cane & prosthesis with minA. Goal status: Set 08/24/2022    UPDATED LONG TERM GOALS: Target date: 11/19/2022   Patient demonstrates & verbalized understanding of prosthetic care to enable safe utilization of prosthesis. Goal status: Ongoing 08/24/2022  2.   Patient tolerates prosthesis wear >90% of awake hours without skin or limb pain issues. Goal status: Ongoing 08/24/2022   3.   Berg Balance >/= 36/56 to indicate lower fall risk Goal status: Ongoing 08/24/2022   4.   Patient ambulates >300' with prosthesis & LRAD independently Goal status:Ongoing 08/24/2022   5.   Patient negotiates ramps, curbs & stairs with single rail with prosthesis & LRAD independently. Goal status: Ongoing 08/24/2022      PLAN: PT FREQUENCY: 1x/week for 8 additional visits    PT DURATION: 12 weeks   PLANNED INTERVENTIONS: Therapeutic exercises, Therapeutic activity, Neuromuscular re-education, Balance training, Gait training, Patient/Family education, Self Care, Stair training, Vestibular training, Canalith repositioning, Prosthetic training, DME instructions, Vasopneumatic device, and physical performance testing   PLAN FOR NEXT SESSION:   check STGs,   prosthetic gait with cane without HHA, work on ramp & curb with cane, standing balance, continue 1x/wk due to visit limit   Jamey Reas, PT, DPT 09/16/2022, 4:55 PM

## 2022-09-21 ENCOUNTER — Ambulatory Visit (INDEPENDENT_AMBULATORY_CARE_PROVIDER_SITE_OTHER): Payer: BC Managed Care – PPO | Admitting: Gastroenterology

## 2022-09-21 ENCOUNTER — Other Ambulatory Visit: Payer: Self-pay | Admitting: Family Medicine

## 2022-09-21 DIAGNOSIS — Z23 Encounter for immunization: Secondary | ICD-10-CM

## 2022-09-21 DIAGNOSIS — D649 Anemia, unspecified: Secondary | ICD-10-CM | POA: Diagnosis not present

## 2022-09-21 DIAGNOSIS — Z992 Dependence on renal dialysis: Secondary | ICD-10-CM

## 2022-09-21 DIAGNOSIS — K7460 Unspecified cirrhosis of liver: Secondary | ICD-10-CM | POA: Diagnosis not present

## 2022-09-21 DIAGNOSIS — N186 End stage renal disease: Secondary | ICD-10-CM | POA: Diagnosis not present

## 2022-09-21 DIAGNOSIS — M109 Gout, unspecified: Secondary | ICD-10-CM

## 2022-09-21 NOTE — Progress Notes (Signed)
Patient tolerated well.

## 2022-09-23 ENCOUNTER — Ambulatory Visit (INDEPENDENT_AMBULATORY_CARE_PROVIDER_SITE_OTHER): Payer: BC Managed Care – PPO | Admitting: Physical Therapy

## 2022-09-23 ENCOUNTER — Encounter: Payer: Self-pay | Admitting: Physical Therapy

## 2022-09-23 DIAGNOSIS — M79661 Pain in right lower leg: Secondary | ICD-10-CM

## 2022-09-23 DIAGNOSIS — R2681 Unsteadiness on feet: Secondary | ICD-10-CM

## 2022-09-23 DIAGNOSIS — L98498 Non-pressure chronic ulcer of skin of other sites with other specified severity: Secondary | ICD-10-CM | POA: Diagnosis not present

## 2022-09-23 DIAGNOSIS — R609 Edema, unspecified: Secondary | ICD-10-CM

## 2022-09-23 DIAGNOSIS — M6281 Muscle weakness (generalized): Secondary | ICD-10-CM | POA: Diagnosis not present

## 2022-09-23 DIAGNOSIS — R2689 Other abnormalities of gait and mobility: Secondary | ICD-10-CM

## 2022-09-23 DIAGNOSIS — M21372 Foot drop, left foot: Secondary | ICD-10-CM

## 2022-09-23 NOTE — Therapy (Signed)
OUTPATIENT PHYSICAL THERAPY TREATMENT NOTE  Patient Name: Jeffrey Ayala MRN: [id number] DOB:[birth year], 61 y.o., male Today's Date: 09/23/2022  PCP: [name], NP-C REFERRING PROVIDER: [name], MD   END OF SESSION:   PT End of Session - 09/23/22 0848     Visit Number 15    Number of Visits 20    Date for PT Re-Evaluation 11/20/22    Authorization Type [employer] comm PPO    Authorization Time Period 20 visit limit PT/OT/chiro, $35 co-pay    Authorization - Visit Number 15    Authorization - Number of Visits 20    Progress Note Due on Visit 40    PT Start Time [phone]    PT Stop Time 0930    PT Time Calculation (min) 47 min    Equipment Utilized During Treatment Gait belt    Activity Tolerance Patient tolerated treatment well;Patient limited by fatigue    Behavior During Therapy WFL for tasks assessed/performed;Flat affect                  Past Medical History:  Diagnosis Date   A-fib ([place])    Anemia    Arthritis    Atrial fibrillation, chronic ([name])    not on [facility]   Cardiac arrest ([place]) 02/2022   in the setting of septic shock   Cirrhosis of liver ([place])    Concussion    Eosinophilic esophagitis    EGD/bx by Dr. Havery Moros   ESRD needing dialysis [facility])    GERD (gastroesophageal reflux disease)    History of gastric ulcer    History of kidney stones    Intermittent dysphagia    Scrotal infection 02/2022   Septic shock ([place]) 02/2022   Vasoplegia - > L TMA and R BKA   Past Surgical History:  Procedure Laterality Date   AMPUTATION Right 03/15/2022   Procedure: RIGHT BELOW KNEE AMPUTATION;  Surgeon: [name], MD;  Location: [place];  Service: Orthopedics;  Laterality: Right;   AV FISTULA PLACEMENT Left 08/28/2022   Procedure: LEFT ARM ARTERIOVENOUS (AV) FISTULA CREATION;  Surgeon: Waynetta Sandy, MD;  Location: [place];  Service: Vascular;  Laterality: Left;   CYSTOSCOPY     CYSTOSCOPY WITH STENT PLACEMENT Bilateral 08/15/2022    Procedure: CYSTOSCOPY WITH STENT PLACEMENT;  Surgeon: Remi Haggard, MD;  Location: [place];  Service: Urology;  Laterality: Bilateral;   CYSTOSCOPY/URETEROSCOPY/HOLMIUM LASER/STENT PLACEMENT Bilateral 09/14/2022   Procedure: CYSTOSCOPY/URETEROSCOPY/HOLMIUM LASER/STENT PLACEMENT;  Surgeon: Raynelle Bring, MD;  Location: [facility];  Service: Urology;  Laterality: Bilateral;   DENTAL SURGERY     FINGER SURGERY     HERNIA REPAIR     at age 38   IR FLUORO GUIDE CV LINE RIGHT  03/09/2022   IR PATIENT EVAL TECH 0-60 MINS  03/09/2022   IR US GUIDE VASC ACCESS RIGHT  03/09/2022   KNEE ARTHROSCOPY Left    LITHOTRIPSY     NEPHROLITHOTOMY     SCROTAL EXPLORATION N/A 03/16/2022   Procedure: IRRIGATION AND DEBRIDEMENT SCROTUM;  Surgeon: Raynelle Bring, MD;  Location: [place];  Service: Urology;  Laterality: N/A;   SCROTAL EXPLORATION N/A 03/28/2022   Procedure: IRRIGATION AND DEBRIDEMENT SCROTUM;  Surgeon: Ardis Hughs, MD;  Location: [place];  Service: Urology;  Laterality: N/A;   TONSILLECTOMY     TOTAL KNEE ARTHROPLASTY Bilateral 11/04/2018   Procedure: BILATERAL TOTAL KNEE ARTHROPLASTY;  Surgeon: [name], MD;  Location: [place];  Service: Orthopedics;  Laterality: Bilateral;  spinal/epidural per anesthesiologist   TRANSMETATARSAL AMPUTATION Left 03/15/2022   Procedure: LEFT TRANSMETATARSAL AMPUTATION;  Surgeon: [name], MD;  Location: [place];  Service: Orthopedics;  Laterality: Left;   Patient Active Problem List   Diagnosis Date Noted   Acute bilateral obstructive uropathy 08/15/2022   Skin breakdown 07/29/2022   Suicidal ideations 06/09/2022   Chills (without fever) 06/09/2022   Severe depression ([place]) 06/09/2022   Acute cough 06/09/2022   End stage renal disease ([place]) 06/02/2022   Reactive depression 05/31/2022   Belching 05/27/2022   Gastroesophageal reflux disease 05/27/2022   Atrial fibrillation ([place]) 05/20/2022   Long term (current) use of anticoagulants 05/20/2022   S/P  transmetatarsal amputation of foot, left ([place]) 04/18/2022   CKD (chronic kidney disease) stage 5, GFR less than 15 ml/min ([place]) 04/18/2022   Secondary hyperparathyroidism of renal origin ([place]) 04/10/2022   Pruritus, unspecified 04/10/2022   Nausea 04/10/2022   Iron deficiency anemia, unspecified 04/10/2022   Hypothyroidism, unspecified 04/10/2022   Other acute kidney failure ([place]) 04/10/2022   Dyspnea, unspecified 37/08/6268   Eosinophilic esophagitis 48/54/6270   Gastric ulcer, unspecified as acute or chronic, without hemorrhage or perforation 04/10/2022   Coagulation defect, unspecified ([place]) 04/10/2022   Allergy, unspecified, initial encounter 04/10/2022   Anaphylactic shock, unspecified, initial encounter 04/10/2022   Arthralgia of right lower leg    Acute blood loss anemia    Unilateral complete BKA, right, initial encounter ([place]) 04/03/2022   S/P BKA (below knee amputation), right ([place]) 04/03/2022   Old myocardial infarction 04/02/2022   Dependence on renal dialysis ([place]) 04/02/2022   Acquired absence of right leg below knee ([place]) 04/02/2022   Cirrhosis ([place]) 03/31/2022   Fournier's gangrene in male    Scrotal infection 03/25/2022   Normocytic anemia 03/25/2022   Diarrhea 03/25/2022   Shock liver 03/25/2022   Acute respiratory failure with hypoxia ([place]) 03/25/2022   Gout 03/25/2022   Acute nonintractable headache 03/25/2022   Demand ischemia 03/25/2022   Thrombocytopenia ([place]) 03/25/2022   Obesity, Class III, BMI 40-49.9 (morbid obesity) ([place]) 03/25/2022   Cardiac arrest ([place]) 03/25/2022   Hypotension    Respiratory insufficiency    Cellulitis    Malnutrition of moderate degree 03/12/2022   Altered mental status    Gangrene of right foot ([place])    Gangrene of left foot ([place])    Severe protein-calorie malnutrition ([place]) 03/10/2022   Encounter for central line placement    Pressure injury of skin 02/23/2022   Septic shock ([place]) 02/22/2022   AKI (acute kidney injury)  ([place])    Generalized abdominal pain    Lactic acidosis    Atrial fibrillation with RVR ([name])    Hypokalemia    Hypomagnesemia    Pain in finger of right hand 08/14/2021   Degenerative joint disease of shoulder region 05/07/2020   Olecranon bursitis of right elbow 05/07/2020   Pain in joint of right shoulder 04/26/2020   S/P TKR (total knee replacement), bilateral 11/04/2018   Bilateral primary osteoarthritis of knee    DDD (degenerative disc disease), cervical 01/10/2018   Rupture of medial head of gastrocnemius, left, sequela 01/14/2017   Kidney stones 06/22/2016   Essential hypertension 08/01/2010   DYSTHYMIC DISORDER 07/08/2010   PALPITATIONS 07/08/2010    REFERRING DIAG: Z89.511 (ICD-10-CM) - Hx of below knee amputation, right   ONSET DATE: 06/19/2022 Prosthesis delivery  THERAPY DIAG:  Other abnormalities of gait and mobility  Muscle weakness (generalized)  Unsteadiness on feet  Non-pressure chronic ulcer of skin of other sites with other specified severity ([place])  Foot drop, left  Edema, unspecified type  Pain in right lower leg  Rationale for Evaluation and Treatment Rehabilitation  PERTINENT HISTORY: Rt BKA 03/15/22, Left MTA 03/15/2022, bil. TKA 11/04/18, ESRD / chronic kidney disease st5 dialysis Tues/Thurs/Sat, cirrhosis, cardiac arrest May 23 post intubation, A-Fib  PRECAUTIONS: Fall, BP only in RUE starting 10/6  SUBJECTIVE:  He is wearing prosthesis all awake hours with no pain. He wears shrinker over night but it comes off in night. It is hard to get prosthesis on limb in morning.   PAIN:  Are you having pain? Distal residual limb  0/10 when sitting & standing 0/10 with gait    OBJECTIVE: (objective measures completed at initial evaluation unless otherwise dated)    COGNITION: 06/22/2022  Overall cognitive status: Within functional limits for tasks assessed and pt had flat affect so unable to fully assess cognition.   POSTURE: 06/22/2022  rounded  shoulders, forward head, flexed trunk , and weight shift left   LOWER EXTREMITY ROM:   ROM P:passive  A:active Right Eval 06/22/2022 Left Eval 06/22/2022  Hip flexion      Hip extension Standing A: ~30* limitation Standing A: ~30* limitation  Hip abduction      Hip adduction      Hip internal rotation      Hip external rotation      Knee flexion      Knee extension Standing A: ~15* limitation Standing A: ~15* limitation  Ankle dorsiflexion   Standing A: ~25* limitation  Ankle plantarflexion      Ankle inversion      Ankle eversion       (Blank rows = not tested)   LOWER EXTREMITY MMT:   MMT Right Eval 06/22/2022 Left Eval 06/22/2022  Hip flexion 4/5 4/5  Hip extension 2/5 functional 2/5 functional  Hip abduction 2/5 functional 2/5 functional  Hip adduction      Hip internal rotation      Hip external rotation      Knee flexion      Knee extension 3-/5 functional 3-/5 functional  Ankle dorsiflexion 2-/5    Ankle plantarflexion      Ankle inversion      Ankle eversion      (Blank rows = not tested)   TRANSFERS: 06/22/2022  Sit to stand: Mod A and from 22" w/c requiring armrests to push & RW to stabilize 06/22/2022  Stand to sit: Min A from RW to 22" w/c unable to release RW to reach for w/c until seated.   GAIT: 06/22/2022 Gait pattern: step to pattern, decreased step length- Left, decreased stance time- Right, decreased stride length, decreased hip/knee flexion- Right, circumduction- Right, Right hip hike, knee flexed in stance- Right, knee flexed in stance- Left, antalgic, trunk flexed, abducted- Right, and poor foot clearance- Left excessive weightbearing BUEs on RW Distance walked: 5' Assistive device utilized: Environmental consultant - 2 wheeled and right TTA prosthesis / left plate & shoe filler Level of assistance: Mod A Comments: post gait HR 82, SpO2 99% with dyspnea noted   FUNCTIONAL TESTs:  08/24/2022 Merrilee Jansky Balance 12/56     06/22/2022  Static standing balance with  BUE support on RW for 1 min with minA.     CURRENT PROSTHETIC WEAR ASSESSMENT: 06/22/2022 Patient is dependent with: skin check, residual limb care, care of non-amputated limb, prosthetic cleaning, ply sock cleaning, correct ply sock adjustment, proper wear schedule/adjustment,  and proper weight-bearing schedule/adjustment Donning prosthesis: Total A and max cues required, Doffing prosthesis: Total A and wife able to doffe Prosthetic wear tolerance: 1-2 hours 1-2x/day, for 3 of 3 days since delivery.  Prosthetic weight bearing tolerance: 0 minutes unable to stand outside of PT Edema: pitting with >5sec capillary refill Residual limb condition: 81m wound with serous drainage & black scab present,  calf measurement is 42.2cm Prosthetic description:  silicon liner with pin suspension, flexible inner socket Total Contact design, dynamic response foot with hydraulic PF/DF K code/activity level with prosthetic use: Level 3     TODAY'S TREATMENT:  09/23/2022 Prosthetic Training with TTA prosthesis: PT recommended wearing shrinker over night. He is seeing prosthetist today after PT.  Request to be measured so size is correct for current limb size.  PT also instructed pt & wife in creating footed pajama pants for RLE which can help with both keeping shrinker on limb and warmth for decreasing phantom pain. Pt verbalized understanding.  Pt able to stad 2 min without UE support safely.  PT demo & verbal cues on step width / not abducting using line for visual, proprioception (4 steps correct & 2 steps incorrect) and correcting when feels wrong.  Pt ambulated 50' X 5 and 125' with SPC stand alone tip with minA / min guard.  Stairs with 2 rails: PT demo & verbal cues on technique.  Descend step-to patttern alternating lead LE for 6 steps and alternating 5 steps with CGA.  Ascend alternating with CGA.   09/16/2022 Prosthetic Training with TTA prosthesis: Wound healed & no pain reported PT reviewed donning  suction suspension to get it all the way up. Pt verbalized understanding. PT instructed in sit to/from stand technique with demo.  Pt performed 10 reps pushing off 18" chair seat (no armrests used) and stabilizing without UE support as goal.  He required occasional touch to stabilize or use of back of legs against stable chair.  PT educated on how to set up with recommendation 10 reps 2-3 times per day.  Standing trunk flexion reaching under control to chair bottom anterior then reaching one arm to floor. Alternating lead UE and UE reaching to floor. PT recommended 5 reps per UE lead 1-2 x/day. Pt ambulated 150' with cane with min guard & no HHA. Verbal cues on upright posture looking forward. PT instructed in wife assisting without HHA.  If walking in hall keep right side near wall but do not touch unless off balance.  PT recommended walking program with wife in parking lot or inside church if weather indicates working on increasing distance with sets.  PT demo technique for backwards & sidestepping gait.  Pt performed 10' ea with cane with minA.  Pt & wife verbalized understanding of above activities for HEP.    09/02/2022 Prosthetic Training with TTA prosthesis: Wound appears healed.  He continues with under liner as he reports more comfortable.  PT educated on sweat & need to switch if damp. Pt & wife verbalized understanding.  PT reviewed donning vac pin system with recommendation to lean posterior pelvis against counter when adjusting sleeve standing.  Pt arrived amb with his RW for first time today.  PT adjusted height to facilitate upright posture & pt reports feels better.  Pt amb 25' with cane stand alone tip & HHA with minA.  PT instructed pt & wife how to provide HHA. Pt amb 25', 430 & 568 with wife assisting & PT supervising with cane & HHA.  PT demo & verbal cues on sidestepping & walking backwards with TTA prosthesis. Pt performed with RUE on counter & LUE cane with supervision.  Pt &  wife report how to work on technique at home.     HOME EXERCISE PROGRAM:  AT [facility] FIND YOUR MIDLINE POSITION AND PLACE FEET EQUAL DISTANCE FROM THE MIDLINE.  Try to find this position when standing still for activities.   USE TAPE ON FLOOR TO MARK THE MIDLINE POSITION which is even with middle of sink.  You also should try to feel with your limb pressure in socket.  You are trying to feel with limb what you used to feel with the bottom of your foot.  Side to Side Shift: Moving your hips only (not shoulders): move weight onto your left leg, HOLD/FEEL pressure in socket.  Move back to equal weight on each leg, HOLD/FEEL pressure in socket. Move weight onto your right leg, HOLD/FEEL pressure in socket. Move back to equal weight on each leg, HOLD/FEEL pressure in socket. Repeat.  Start with both hands on sink, progress to hand on prosthetic side only, then no hands.  Front to Back Shift: Moving your hips only (not shoulders): move your weight forward onto your toes, HOLD/FEEL pressure in socket. Move your weight back to equal Flat Foot on both legs, HOLD/FEEL  pressure in socket. Move your weight back onto your heels, HOLD/FEEL  pressure in socket. Move your weight back to equal on both legs, HOLD/FEEL  pressure in socket. Repeat.  Start with both hands on sink, progress to hand on prosthetic side only, then no hands.  Moving Cones / Cups: With equal weight on each leg: Hold on with one hand the first time, then progress to no hand supports. Move cups from one side of sink to the other. Place cups ~2" out of your reach, progress to 10" beyond reach.  Place one hand in middle of sink and reach with other hand. Do both arms.  Then hover one hand and move cups with other hand.  Overhead/Upward Reaching: alternated reaching up to top cabinets or ceiling if no cabinets present. Keep equal weight on each leg. Start with one hand support on counter while other hand reaches and progress to no hand support with  reaching.  ace one hand in middle of sink and reach with other hand. Do both arms.  Then hover one hand and move cups with other hand.  5.   Looking Over Shoulders: With equal weight on each leg: alternate turning to look over your shoulders with one hand support on counter as needed.  Start with head motions only to look in front of shoulder, then even with shoulder and progress to looking behind you. To look to side, move head /eyes, then shoulder on side looking pulls back, shift more weight to side looking and pull hip back. Place one hand in middle of sink and let go with other hand so your shoulder can pull back. Switch hands to look other way.   Then hover one hand and look over shoulder. If looking right, use left hand at sink. If looking left, use right hand at sink. 6.  Stepping with leg that is not amputated:  Move items under cabinet out of your way. Shift your hips/pelvis so weight on prosthesis. Tighten muscles in hip on prosthetic side.  SLOWLY step other leg so front of foot is in cabinet. Then step back to floor.   Pt also recommending sitting at edge  of w/c with feet supported on floor but no back support each hour in w/c until back fatigues. PT also demo & instructed in sitting on 24" stool. Pt & wife verbalized understanding.     ASSESSMENT:   CLINICAL IMPRESSION: Pt & wife appear to understand shrinker and night time recommendation.  Pt improved prosthetic gait with instruction to decrease abduction.  He seems to understand neg stairs but needs 2 rails currently. He met all STGs.   He continues to benefit from skilled PT.     OBJECTIVE IMPAIRMENTS Abnormal gait, cardiopulmonary status limiting activity, decreased activity tolerance, decreased balance, decreased endurance, decreased knowledge of condition, decreased knowledge of use of DME, decreased mobility, difficulty walking, decreased ROM, decreased strength, increased edema, impaired flexibility, postural dysfunction, and  prosthetic dependency .    ACTIVITY LIMITATIONS carrying, lifting, bending, sitting, standing, squatting, stairs, transfers, locomotion level, and prosthesis use   PARTICIPATION LIMITATIONS: driving, community activity, occupation, yard work, and recreational of camping   [place], Past/current experiences, Time since onset of injury/illness/exacerbation, and 3+ comorbidities: see PMH  are also affecting patient's functional outcome.    REHAB POTENTIAL: Good   CLINICAL DECISION MAKING: Evolving/moderate complexity   EVALUATION COMPLEXITY: Moderate     GOALS: Goals reviewed with patient? Yes   SHORT TERM GOALS: Target date: 09/23/2022   Patient donnes new prosthesis modified independent & verbalizes adjusting ply socks.  Goal status: Met 09/23/2022 2.  Patient tolerates prosthesis >90% awake hrs /day without increase in skin issues & no limb pain. Goal status: Met 09/23/2022   3.  Patient able to stand 2 min without UE support with supervision. Goal status: Met 09/23/2022   4. Patient ambulates 70' with cane & prosthesis with minA. Goal status: Met 09/23/2022    UPDATED LONG TERM GOALS: Target date: 11/19/2022   Patient demonstrates & verbalized understanding of prosthetic care to enable safe utilization of prosthesis. Goal status: Ongoing 08/24/2022  2.   Patient tolerates prosthesis wear >90% of awake hours without skin or limb pain issues. Goal status: Ongoing 08/24/2022   3.   Berg Balance >/= 36/56 to indicate lower fall risk Goal status: Ongoing 08/24/2022   4.   Patient ambulates >300' with prosthesis & LRAD independently Goal status:Ongoing 08/24/2022   5.   Patient negotiates ramps, curbs & stairs with single rail with prosthesis & LRAD independently. Goal status: Ongoing 08/24/2022     PLAN: PT FREQUENCY: 1x/week for 8 additional visits    PT DURATION: 12 weeks   PLANNED INTERVENTIONS: Therapeutic exercises, Therapeutic activity, Neuromuscular  re-education, Balance training, Gait training, Patient/Family education, Self Care, Stair training, Vestibular training, Canalith repositioning, Prosthetic training, DME instructions, Vasopneumatic device, and physical performance testing   PLAN FOR NEXT SESSION:   prosthetic gait with cane without HHA, work on ramp & curb with cane, standing balance, continue 1x/wk due to visit limit   Jamey Reas, PT, DPT 09/23/2022, 11:24 AM

## 2022-09-24 ENCOUNTER — Ambulatory Visit (HOSPITAL_COMMUNITY)
Admission: RE | Admit: 2022-09-24 | Discharge: 2022-09-24 | Disposition: A | Discharge status: Home / Self Care | Payer: BC Managed Care – PPO | Source: Ambulatory Visit | Attending: Gastroenterology | Admitting: Gastroenterology

## 2022-09-24 DIAGNOSIS — N281 Cyst of kidney, acquired: Secondary | ICD-10-CM | POA: Diagnosis not present

## 2022-09-24 DIAGNOSIS — Z1211 Encounter for screening for malignant neoplasm of colon: Secondary | ICD-10-CM | POA: Diagnosis not present

## 2022-09-24 DIAGNOSIS — D649 Anemia, unspecified: Secondary | ICD-10-CM | POA: Diagnosis not present

## 2022-09-24 DIAGNOSIS — Z992 Dependence on renal dialysis: Secondary | ICD-10-CM | POA: Insufficient documentation

## 2022-09-24 DIAGNOSIS — N186 End stage renal disease: Secondary | ICD-10-CM | POA: Diagnosis not present

## 2022-09-24 DIAGNOSIS — K7460 Unspecified cirrhosis of liver: Secondary | ICD-10-CM | POA: Insufficient documentation

## 2022-09-29 DIAGNOSIS — N202 Calculus of kidney with calculus of ureter: Secondary | ICD-10-CM | POA: Diagnosis not present

## 2022-09-30 ENCOUNTER — Encounter: Payer: BC Managed Care – PPO | Admitting: Physical Therapy

## 2022-09-30 DIAGNOSIS — D631 Anemia in chronic kidney disease: Secondary | ICD-10-CM | POA: Diagnosis not present

## 2022-09-30 DIAGNOSIS — N189 Chronic kidney disease, unspecified: Secondary | ICD-10-CM | POA: Diagnosis not present

## 2022-09-30 DIAGNOSIS — I129 Hypertensive chronic kidney disease with stage 1 through stage 4 chronic kidney disease, or unspecified chronic kidney disease: Secondary | ICD-10-CM | POA: Diagnosis not present

## 2022-09-30 DIAGNOSIS — N179 Acute kidney failure, unspecified: Secondary | ICD-10-CM | POA: Diagnosis not present

## 2022-09-30 DIAGNOSIS — N1832 Chronic kidney disease, stage 3b: Secondary | ICD-10-CM | POA: Diagnosis not present

## 2022-09-30 DIAGNOSIS — N2581 Secondary hyperparathyroidism of renal origin: Secondary | ICD-10-CM | POA: Diagnosis not present

## 2022-09-30 NOTE — Therapy (Incomplete)
OUTPATIENT PHYSICAL THERAPY TREATMENT NOTE  Patient Name: Jeffrey Ayala MRN: [id number] DOB:07-Jul-1961, 61 y.o., male Today's Date: 09/30/2022  PCP: [name], NP-C REFERRING PROVIDER: [name], MD   END OF SESSION:          Past Medical History:  Diagnosis Date   A-fib [facility])    Anemia    Arthritis    Atrial fibrillation, chronic ([place])    not on [facility]   Cardiac arrest ([place]) 02/2022   in the setting of septic shock   Cirrhosis of liver ([place])    Concussion    Eosinophilic esophagitis    EGD/bx by Dr. 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   Past Surgical History:  Procedure Laterality Date   AMPUTATION Right 03/15/2022   Procedure: RIGHT BELOW KNEE AMPUTATION;  Surgeon: [name], MD;  Location: [place];  Service: Orthopedics;  Laterality: Right;   AV FISTULA PLACEMENT Left 08/28/2022   Procedure: LEFT ARM ARTERIOVENOUS (AV) FISTULA CREATION;  Surgeon: Waynetta Sandy, MD;  Location: [place];  Service: Vascular;  Laterality: Left;   CYSTOSCOPY     CYSTOSCOPY WITH STENT PLACEMENT Bilateral 08/15/2022   Procedure: CYSTOSCOPY WITH STENT PLACEMENT;  Surgeon: Remi Haggard, MD;  Location: [place];  Service: Urology;  Laterality: Bilateral;   CYSTOSCOPY/URETEROSCOPY/HOLMIUM LASER/STENT PLACEMENT Bilateral 09/14/2022   Procedure: CYSTOSCOPY/URETEROSCOPY/HOLMIUM LASER/STENT PLACEMENT;  Surgeon: Raynelle Bring, MD;  Location: [facility];  Service: Urology;  Laterality: Bilateral;   DENTAL SURGERY     FINGER SURGERY     HERNIA REPAIR     at age 1   IR FLUORO GUIDE CV LINE RIGHT  03/09/2022   IR PATIENT EVAL TECH 0-60 MINS  03/09/2022   IR US GUIDE VASC ACCESS RIGHT  03/09/2022   KNEE ARTHROSCOPY Left    LITHOTRIPSY     NEPHROLITHOTOMY      SCROTAL EXPLORATION N/A 03/16/2022   Procedure: IRRIGATION AND DEBRIDEMENT SCROTUM;  Surgeon: Raynelle Bring, MD;  Location: [place];  Service: Urology;  Laterality: N/A;   SCROTAL EXPLORATION N/A 03/28/2022   Procedure: IRRIGATION AND DEBRIDEMENT SCROTUM;  Surgeon: Ardis Hughs, MD;  Location: [place];  Service: Urology;  Laterality: N/A;   TONSILLECTOMY     TOTAL KNEE ARTHROPLASTY Bilateral 11/04/2018   Procedure: BILATERAL TOTAL KNEE ARTHROPLASTY;  Surgeon: [name], MD;  Location: [place];  Service: Orthopedics;  Laterality: Bilateral;  spinal/epidural per anesthesiologist   TRANSMETATARSAL AMPUTATION Left 03/15/2022   Procedure: LEFT TRANSMETATARSAL AMPUTATION;  Surgeon: [name], MD;  Location: [place];  Service: Orthopedics;  Laterality: Left;   Patient Active Problem List   Diagnosis Date Noted   Acute bilateral obstructive uropathy 08/15/2022   Skin breakdown 07/29/2022   Suicidal ideations 06/09/2022   Chills (without fever) 06/09/2022   Severe depression ([place]) 06/09/2022   Acute cough 06/09/2022   End stage renal disease ([place]) 06/02/2022   Reactive depression 05/31/2022   Belching 05/27/2022   Gastroesophageal reflux disease 05/27/2022   Atrial fibrillation ([place]) 05/20/2022   Long term (current) use of anticoagulants 05/20/2022   S/P transmetatarsal amputation of foot, left ([place]) 04/18/2022   CKD (chronic kidney disease) stage 5, GFR less than 15 ml/min (  [place]) 04/18/2022   Secondary hyperparathyroidism of renal origin ([place]) 04/10/2022   Pruritus, unspecified 04/10/2022   Nausea 04/10/2022   Iron deficiency anemia, unspecified 04/10/2022   Hypothyroidism, unspecified 04/10/2022   Other acute kidney failure ([place]) 04/10/2022   Dyspnea, unspecified 42/59/5638   Eosinophilic esophagitis 75/64/3329   Gastric ulcer, unspecified as acute or chronic, without hemorrhage or perforation 04/10/2022   Coagulation defect, unspecified ([place]) 04/10/2022   Allergy, unspecified,  initial encounter 04/10/2022   Anaphylactic shock, unspecified, initial encounter 04/10/2022   Arthralgia of right lower leg    Acute blood loss anemia    Unilateral complete BKA, right, initial encounter ([place]) 04/03/2022   S/P BKA (below knee amputation), right ([place]) 04/03/2022   Old myocardial infarction 04/02/2022   Dependence on renal dialysis ([place]) 04/02/2022   Acquired absence of right leg below knee ([place]) 04/02/2022   Cirrhosis ([place]) 03/31/2022   Fournier's gangrene in male    Scrotal infection 03/25/2022   Normocytic anemia 03/25/2022   Diarrhea 03/25/2022   Shock liver 03/25/2022   Acute respiratory failure with hypoxia ([place]) 03/25/2022   Gout 03/25/2022   Acute nonintractable headache 03/25/2022   Demand ischemia 03/25/2022   Thrombocytopenia ([place]) 03/25/2022   Obesity, Class III, BMI 40-49.9 (morbid obesity) ([place]) 03/25/2022   Cardiac arrest ([place]) 03/25/2022   Hypotension    Respiratory insufficiency    Cellulitis    Malnutrition of moderate degree 03/12/2022   Altered mental status    Gangrene of right foot ([place])    Gangrene of left foot ([place])    Severe protein-calorie malnutrition ([place]) 03/10/2022   Encounter for central line placement    Pressure injury of skin 02/23/2022   Septic shock ([place]) 02/22/2022   AKI (acute kidney injury) ([place])    Generalized abdominal pain    Lactic acidosis    Atrial fibrillation with RVR ([name])    Hypokalemia    Hypomagnesemia    Pain in finger of right hand 08/14/2021   Degenerative joint disease of shoulder region 05/07/2020   Olecranon bursitis of right elbow 05/07/2020   Pain in joint of right shoulder 04/26/2020   S/P TKR (total knee replacement), bilateral 11/04/2018   Bilateral primary osteoarthritis of knee    DDD (degenerative disc disease), cervical 01/10/2018   Rupture of medial head of gastrocnemius, left, sequela 01/14/2017   Kidney stones 06/22/2016   Essential hypertension 08/01/2010   DYSTHYMIC DISORDER  07/08/2010   PALPITATIONS 07/08/2010    REFERRING DIAG: Z89.511 (ICD-10-CM) - Hx of below knee amputation, right   ONSET DATE: 06/19/2022 Prosthesis delivery  THERAPY DIAG:  No diagnosis found.  Rationale for Evaluation and Treatment Rehabilitation  PERTINENT HISTORY: Rt BKA 03/15/22, Left MTA 03/15/2022, bil. TKA 11/04/18, ESRD / chronic kidney disease st5 dialysis Tues/Thurs/Sat, cirrhosis, cardiac arrest May 23 post intubation, A-Fib  PRECAUTIONS: Fall, BP only in RUE starting 10/6  SUBJECTIVE:  *** He is wearing prosthesis all awake hours with no pain. He wears shrinker over night but it comes off in night. It is hard to get prosthesis on limb in morning.   PAIN:  Are you having pain? Distal residual limb  0/10 when sitting & standing 0/10 with gait    OBJECTIVE: (objective measures completed at initial evaluation unless otherwise dated)    COGNITION: 06/22/2022  Overall cognitive status: Within functional limits for tasks assessed and pt had flat affect so unable to fully assess cognition.   POSTURE: 06/22/2022  rounded shoulders, forward head, flexed trunk , and weight  shift left   LOWER EXTREMITY ROM:   ROM P:passive  A:active Right Eval 06/22/2022 Left Eval 06/22/2022  Hip flexion      Hip extension Standing A: ~30* limitation Standing A: ~30* limitation  Hip abduction      Hip adduction      Hip internal rotation      Hip external rotation      Knee flexion      Knee extension Standing A: ~15* limitation Standing A: ~15* limitation  Ankle dorsiflexion   Standing A: ~25* limitation  Ankle plantarflexion      Ankle inversion      Ankle eversion       (Blank rows = not tested)   LOWER EXTREMITY MMT:   MMT Right Eval 06/22/2022 Left Eval 06/22/2022  Hip flexion 4/5 4/5  Hip extension 2/5 functional 2/5 functional  Hip abduction 2/5 functional 2/5 functional  Hip adduction      Hip internal rotation      Hip external rotation      Knee flexion       Knee extension 3-/5 functional 3-/5 functional  Ankle dorsiflexion 2-/5    Ankle plantarflexion      Ankle inversion      Ankle eversion      (Blank rows = not tested)   TRANSFERS: 06/22/2022  Sit to stand: Mod A and from 22" w/c requiring armrests to push & RW to stabilize 06/22/2022  Stand to sit: Min A from RW to 22" w/c unable to release RW to reach for w/c until seated.   GAIT: 06/22/2022 Gait pattern: step to pattern, decreased step length- Left, decreased stance time- Right, decreased stride length, decreased hip/knee flexion- Right, circumduction- Right, Right hip hike, knee flexed in stance- Right, knee flexed in stance- Left, antalgic, trunk flexed, abducted- Right, and poor foot clearance- Left excessive weightbearing BUEs on RW Distance walked: 5' Assistive device utilized: Environmental consultant - 2 wheeled and right TTA prosthesis / left plate & shoe filler Level of assistance: Mod A Comments: post gait HR 82, SpO2 99% with dyspnea noted   FUNCTIONAL TESTs:  08/24/2022 Merrilee Jansky Balance 12/56   06/22/2022  Static standing balance with BUE support on RW for 1 min with minA.     CURRENT PROSTHETIC WEAR ASSESSMENT: 06/22/2022 Patient is dependent with: skin check, residual limb care, care of non-amputated limb, prosthetic cleaning, ply sock cleaning, correct ply sock adjustment, proper wear schedule/adjustment, and proper weight-bearing schedule/adjustment Donning prosthesis: Total A and max cues required, Doffing prosthesis: Total A and wife able to doffe Prosthetic wear tolerance: 1-2 hours 1-2x/day, for 3 of 3 days since delivery.  Prosthetic weight bearing tolerance: 0 minutes unable to stand outside of PT Edema: pitting with >5sec capillary refill Residual limb condition: 34m wound with serous drainage & black scab present,  calf measurement is 42.2cm Prosthetic description:  silicon liner with pin suspension, flexible inner socket Total Contact design, dynamic response foot with hydraulic  PF/DF K code/activity level with prosthetic use: Level 3     TODAY'S TREATMENT:  09/30/2022 ***  09/23/2022 Prosthetic Training with TTA prosthesis: PT recommended wearing shrinker over night. He is seeing prosthetist today after PT.  Request to be measured so size is correct for current limb size.  PT also instructed pt & wife in creating footed pajama pants for RLE which can help with both keeping shrinker on limb and warmth for decreasing phantom pain. Pt verbalized understanding.  Pt able to stad 2 min without  UE support safely.  PT demo & verbal cues on step width / not abducting using line for visual, proprioception (4 steps correct & 2 steps incorrect) and correcting when feels wrong.  Pt ambulated 50' X 5 and 125' with SPC stand alone tip with minA / min guard.  Stairs with 2 rails: PT demo & verbal cues on technique.  Descend step-to patttern alternating lead LE for 6 steps and alternating 5 steps with CGA.  Ascend alternating with CGA.   09/16/2022 Prosthetic Training with TTA prosthesis: Wound healed & no pain reported PT reviewed donning suction suspension to get it all the way up. Pt verbalized understanding. PT instructed in sit to/from stand technique with demo.  Pt performed 10 reps pushing off 18" chair seat (no armrests used) and stabilizing without UE support as goal.  He required occasional touch to stabilize or use of back of legs against stable chair.  PT educated on how to set up with recommendation 10 reps 2-3 times per day.  Standing trunk flexion reaching under control to chair bottom anterior then reaching one arm to floor. Alternating lead UE and UE reaching to floor. PT recommended 5 reps per UE lead 1-2 x/day. Pt ambulated 150' with cane with min guard & no HHA. Verbal cues on upright posture looking forward. PT instructed in wife assisting without HHA.  If walking in hall keep right side near wall but do not touch unless off balance.  PT recommended walking program  with wife in parking lot or inside church if weather indicates working on increasing distance with sets.  PT demo technique for backwards & sidestepping gait.  Pt performed 10' ea with cane with minA.  Pt & wife verbalized understanding of above activities for HEP.    HOME EXERCISE PROGRAM:  AT [facility] FIND YOUR MIDLINE POSITION AND PLACE FEET EQUAL DISTANCE FROM THE MIDLINE.  Try to find this position when standing still for activities.   USE TAPE ON FLOOR TO MARK THE MIDLINE POSITION which is even with middle of sink.  You also should try to feel with your limb pressure in socket.  You are trying to feel with limb what you used to feel with the bottom of your foot.  Side to Side Shift: Moving your hips only (not shoulders): move weight onto your left leg, HOLD/FEEL pressure in socket.  Move back to equal weight on each leg, HOLD/FEEL pressure in socket. Move weight onto your right leg, HOLD/FEEL pressure in socket. Move back to equal weight on each leg, HOLD/FEEL pressure in socket. Repeat.  Start with both hands on sink, progress to hand on prosthetic side only, then no hands.  Front to Back Shift: Moving your hips only (not shoulders): move your weight forward onto your toes, HOLD/FEEL pressure in socket. Move your weight back to equal Flat Foot on both legs, HOLD/FEEL  pressure in socket. Move your weight back onto your heels, HOLD/FEEL  pressure in socket. Move your weight back to equal on both legs, HOLD/FEEL  pressure in socket. Repeat.  Start with both hands on sink, progress to hand on prosthetic side only, then no hands.  Moving Cones / Cups: With equal weight on each leg: Hold on with one hand the first time, then progress to no hand supports. Move cups from one side of sink to the other. Place cups ~2" out of your reach, progress to 10" beyond reach.  Place one hand in middle of sink and reach with other hand. Do  both arms.  Then hover one hand and move cups with other hand.  Overhead/Upward  Reaching: alternated reaching up to top cabinets or ceiling if no cabinets present. Keep equal weight on each leg. Start with one hand support on counter while other hand reaches and progress to no hand support with reaching.  ace one hand in middle of sink and reach with other hand. Do both arms.  Then hover one hand and move cups with other hand.  5.   Looking Over Shoulders: With equal weight on each leg: alternate turning to look over your shoulders with one hand support on counter as needed.  Start with head motions only to look in front of shoulder, then even with shoulder and progress to looking behind you. To look to side, move head /eyes, then shoulder on side looking pulls back, shift more weight to side looking and pull hip back. Place one hand in middle of sink and let go with other hand so your shoulder can pull back. Switch hands to look other way.   Then hover one hand and look over shoulder. If looking right, use left hand at sink. If looking left, use right hand at sink. 6.  Stepping with leg that is not amputated:  Move items under cabinet out of your way. Shift your hips/pelvis so weight on prosthesis. Tighten muscles in hip on prosthetic side.  SLOWLY step other leg so front of foot is in cabinet. Then step back to floor.   Pt also recommending sitting at edge of w/c with feet supported on floor but no back support each hour in w/c until back fatigues. PT also demo & instructed in sitting on 24" stool. Pt & wife verbalized understanding.     ASSESSMENT:   CLINICAL IMPRESSION: Pt & wife appear to understand shrinker and night time recommendation.  Pt improved prosthetic gait with instruction to decrease abduction.  He seems to understand neg stairs but needs 2 rails currently. He met all STGs.   He continues to benefit from skilled PT.     OBJECTIVE IMPAIRMENTS Abnormal gait, cardiopulmonary status limiting activity, decreased activity tolerance, decreased balance, decreased endurance,  decreased knowledge of condition, decreased knowledge of use of DME, decreased mobility, difficulty walking, decreased ROM, decreased strength, increased edema, impaired flexibility, postural dysfunction, and prosthetic dependency .    ACTIVITY LIMITATIONS carrying, lifting, bending, sitting, standing, squatting, stairs, transfers, locomotion level, and prosthesis use   PARTICIPATION LIMITATIONS: driving, community activity, occupation, yard work, and recreational of camping   [place], Past/current experiences, Time since onset of injury/illness/exacerbation, and 3+ comorbidities: see PMH  are also affecting patient's functional outcome.    REHAB POTENTIAL: Good   CLINICAL DECISION MAKING: Evolving/moderate complexity   EVALUATION COMPLEXITY: Moderate     GOALS: Goals reviewed with patient? Yes   SHORT TERM GOALS: Target date: 09/23/2022   Patient donnes new prosthesis modified independent & verbalizes adjusting ply socks.  Goal status: Met 09/23/2022 2.  Patient tolerates prosthesis >90% awake hrs /day without increase in skin issues & no limb pain. Goal status: Met 09/23/2022   3.  Patient able to stand 2 min without UE support with supervision. Goal status: Met 09/23/2022   4. Patient ambulates 93' with cane & prosthesis with minA. Goal status: Met 09/23/2022    UPDATED LONG TERM GOALS: Target date: 11/19/2022   Patient demonstrates & verbalized understanding of prosthetic care to enable safe utilization of prosthesis. Goal status: Ongoing 08/24/2022  2.  Patient tolerates prosthesis wear >90% of awake hours without skin or limb pain issues. Goal status: Ongoing 08/24/2022   3.   Berg Balance >/= 36/56 to indicate lower fall risk Goal status: Ongoing 08/24/2022   4.   Patient ambulates >300' with prosthesis & LRAD independently Goal status:Ongoing 08/24/2022   5.   Patient negotiates ramps, curbs & stairs with single rail with prosthesis & LRAD  independently. Goal status: Ongoing 08/24/2022     PLAN: PT FREQUENCY: 1x/week for 8 additional visits    PT DURATION: 12 weeks   PLANNED INTERVENTIONS: Therapeutic exercises, Therapeutic activity, Neuromuscular re-education, Balance training, Gait training, Patient/Family education, Self Care, Stair training, Vestibular training, Canalith repositioning, Prosthetic training, DME instructions, Vasopneumatic device, and physical performance testing   PLAN FOR NEXT SESSION:  *** prosthetic gait with cane without HHA, work on ramp & curb with cane, standing balance, continue 1x/wk due to visit limit   Jamey Reas, PT, DPT 09/30/2022, 7:33 AM

## 2022-10-01 ENCOUNTER — Ambulatory Visit (INDEPENDENT_AMBULATORY_CARE_PROVIDER_SITE_OTHER): Payer: BC Managed Care – PPO | Admitting: Family Medicine

## 2022-10-01 ENCOUNTER — Ambulatory Visit (INDEPENDENT_AMBULATORY_CARE_PROVIDER_SITE_OTHER): Payer: BC Managed Care – PPO

## 2022-10-01 ENCOUNTER — Encounter: Payer: Self-pay | Admitting: Family Medicine

## 2022-10-01 ENCOUNTER — Telehealth: Payer: Self-pay

## 2022-10-01 VITALS — BP 124/66 | HR 103 | Temp 97.6°F | Ht 70.0 in

## 2022-10-01 DIAGNOSIS — R29818 Other symptoms and signs involving the nervous system: Secondary | ICD-10-CM | POA: Insufficient documentation

## 2022-10-01 DIAGNOSIS — J019 Acute sinusitis, unspecified: Secondary | ICD-10-CM | POA: Diagnosis not present

## 2022-10-01 DIAGNOSIS — R058 Other specified cough: Secondary | ICD-10-CM

## 2022-10-01 DIAGNOSIS — R059 Cough, unspecified: Secondary | ICD-10-CM | POA: Diagnosis not present

## 2022-10-01 DIAGNOSIS — J811 Chronic pulmonary edema: Secondary | ICD-10-CM | POA: Diagnosis not present

## 2022-10-01 DIAGNOSIS — R0683 Snoring: Secondary | ICD-10-CM | POA: Diagnosis not present

## 2022-10-01 DIAGNOSIS — Z8619 Personal history of other infectious and parasitic diseases: Secondary | ICD-10-CM

## 2022-10-01 DIAGNOSIS — R400 Somnolence: Secondary | ICD-10-CM

## 2022-10-01 LAB — CBC WITH DIFFERENTIAL/PLATELET
Basophils Absolute: 0 10*3/uL (ref 0.0–0.1)
Basophils Relative: 0.6 % (ref 0.0–3.0)
Eosinophils Absolute: 0.1 10*3/uL (ref 0.0–0.7)
Eosinophils Relative: 2.6 % (ref 0.0–5.0)
HCT: 24 % — ABNORMAL LOW (ref 39.0–52.0)
Hemoglobin: 7.9 g/dL — CL (ref 13.0–17.0)
Lymphocytes Relative: 15 % (ref 12.0–46.0)
Lymphs Abs: 0.8 10*3/uL (ref 0.7–4.0)
MCHC: 32.8 g/dL (ref 30.0–36.0)
MCV: 101.4 fl — ABNORMAL HIGH (ref 78.0–100.0)
Monocytes Absolute: 0.4 10*3/uL (ref 0.1–1.0)
Monocytes Relative: 6.8 % (ref 3.0–12.0)
Neutro Abs: 4.1 10*3/uL (ref 1.4–7.7)
Neutrophils Relative %: 75 % (ref 43.0–77.0)
Platelets: 172 10*3/uL (ref 150.0–400.0)
RBC: 2.37 Mil/uL — ABNORMAL LOW (ref 4.22–5.81)
RDW: 20.7 % — ABNORMAL HIGH (ref 11.5–15.5)
WBC: 5.4 10*3/uL (ref 4.0–10.5)

## 2022-10-01 MED ORDER — AMOXICILLIN-POT CLAVULANATE 500-125 MG PO TABS: 1.0000 | ORAL_TABLET | Freq: Two times a day (BID) | ORAL | 0 refills | Status: DC

## 2022-10-01 NOTE — Patient Instructions (Signed)
Please go downstairs for a chest x-ray and labs.  Take the antibiotic, Augmentin, as prescribed.  Take it with food.  Take Mucinex and you may also use Flonase for nasal congestion  You will receive a call from [facility] regarding a home sleep test to screen you for sleep apnea.

## 2022-10-01 NOTE — Progress Notes (Signed)
Please call patient and let him know the following: Your chest x-ray shows a mildly enlarged heart with some pulmonary vascular congestion (indication of heart failure).   No change to the treatment plan we discussed today. I recall your wife mentioning that you are starting on furosemide (diuretic) today which will help with the vascular congestion. If your shortness of breath worsens or if you have any increased swelling, you should be seen again.

## 2022-10-01 NOTE — Telephone Encounter (Signed)
Critical lab results given to [place] today in office.  Hemoglobin of 7.9 reported by Santiago Glad in [place] lab.

## 2022-10-01 NOTE — Progress Notes (Signed)
Subjective:  Jeffrey Ayala is a 61 y.o. male who presents for a productive cough x 2 wks. Rhinorrhea, nasal congestion, sore throat, wheezing, shortness of breath, and chills.   Denies fever, dizziness, chest pain, vomiting or diarrhea.   4 weeks ago he took Amoxicillin for 5 days for same symptoms.   Off of dialysis x 2 wks   GFR 22 on 09/14/2022   Treatment to date:  Robitussin and Dayquil. Nyquil .    No other aggravating or relieving factors.  No other c/o.  ROS as in subjective.   Objective: Vitals:   10/01/22 0923  BP: 124/66  Pulse: (!) 103  Temp: 97.6 F (36.4 C)  SpO2: 95%    General appearance: Alert, WD/WN, no distress, mildly ill appearing                             Skin: warm, no rash                           Head: + frontal and maxillary sinus tenderness                            Eyes: conjunctiva normal, corneas clear, PERRLA                            Ears: external ear canals normal                          Nose: septum midline, turbinates swollen, with erythema and thick discharge             Mouth/throat: MMM, tongue normal, mild pharyngeal erythema                           Neck: supple, no adenopathy, no thyromegaly, nontender                          Heart: normal rate, irregular rhythm                          Lungs: CTA bilaterally, no wheezes, rales, or rhonchi      Assessment: Acute sinusitis with symptoms > 10 days - Plan: CBC with Differential/Platelet, amoxicillin-clavulanate (AUGMENTIN) 500-125 MG tablet  Cough productive of yellow sputum - Plan: DG Chest 2 View, CBC with Differential/Platelet, amoxicillin-clavulanate (AUGMENTIN) 500-125 MG tablet  History of sepsis  Snoring - Plan: Home sleep test  Daytime somnolence - Plan: Home sleep test  Suspected sleep apnea - Plan: Home sleep test   Plan: States CXR and CBC ordered. He is not in any acute distress.   Augmentin renal dosing prescribed.  Try Flonase and Mucinex.  Nasal  saline spray for congestion.  Tylenol prn.    Call or return if worsening or not improving in the next 3-4 days.  Suspect he may have sleep apnea. He snores and falls asleep easily during the day. No HST done in the past. HST ordered today.

## 2022-10-06 ENCOUNTER — Other Ambulatory Visit: Payer: Self-pay | Admitting: *Deleted

## 2022-10-06 DIAGNOSIS — N186 End stage renal disease: Secondary | ICD-10-CM

## 2022-10-07 ENCOUNTER — Encounter: Payer: BC Managed Care – PPO | Admitting: Physical Therapy

## 2022-10-08 ENCOUNTER — Encounter: Payer: Self-pay | Admitting: Physical Therapy

## 2022-10-08 ENCOUNTER — Ambulatory Visit (INDEPENDENT_AMBULATORY_CARE_PROVIDER_SITE_OTHER): Payer: BC Managed Care – PPO | Admitting: Physical Therapy

## 2022-10-08 DIAGNOSIS — R2681 Unsteadiness on feet: Secondary | ICD-10-CM

## 2022-10-08 DIAGNOSIS — L98498 Non-pressure chronic ulcer of skin of other sites with other specified severity: Secondary | ICD-10-CM

## 2022-10-08 DIAGNOSIS — M21372 Foot drop, left foot: Secondary | ICD-10-CM

## 2022-10-08 DIAGNOSIS — R2689 Other abnormalities of gait and mobility: Secondary | ICD-10-CM

## 2022-10-08 DIAGNOSIS — M6281 Muscle weakness (generalized): Secondary | ICD-10-CM

## 2022-10-08 NOTE — Therapy (Signed)
OUTPATIENT PHYSICAL THERAPY TREATMENT NOTE  Patient Name: Jeffrey Ayala MRN: [id number] DOB:May 05, 1961, 61 y.o., male Today's Date: 10/08/2022  PCP: [name], NP-C REFERRING PROVIDER: [name], MD   END OF SESSION:   PT End of Session - 10/08/22 0851     Visit Number 16    Number of Visits 20    Date for PT Re-Evaluation 11/20/22    Authorization Type [employer] comm PPO    Authorization Time Period 20 visit limit PT/OT/chiro, $35 co-pay    Authorization - Visit Number 13    Authorization - Number of Visits 20    Progress Note Due on Visit 26    PT Start Time 0848    PT Stop Time 0932    PT Time Calculation (min) 44 min    Equipment Utilized During Treatment Gait belt    Activity Tolerance Patient tolerated treatment well;Patient limited by fatigue    Behavior During Therapy WFL for tasks assessed/performed;Flat affect                   Past Medical History:  Diagnosis Date   A-fib ([place])    Anemia    Arthritis    Atrial fibrillation, chronic ([name])    not on [facility]   Cardiac arrest ([place]) 02/2022   in the setting of septic shock   Cirrhosis of liver ([place])    Concussion    Eosinophilic esophagitis    EGD/bx by Dr. Havery Moros   ESRD needing dialysis [facility])    GERD (gastroesophageal reflux disease)    History of gastric ulcer    History of kidney stones    Intermittent dysphagia    Scrotal infection 02/2022   Septic shock ([place]) 02/2022   Vasoplegia - > L TMA and R BKA   Past Surgical History:  Procedure Laterality Date   AMPUTATION Right 03/15/2022   Procedure: RIGHT BELOW KNEE AMPUTATION;  Surgeon: [name], MD;  Location: [place];  Service: Orthopedics;  Laterality: Right;   AV FISTULA PLACEMENT Left 08/28/2022   Procedure: LEFT ARM ARTERIOVENOUS (AV) FISTULA CREATION;  Surgeon: Waynetta Sandy, MD;  Location: [place];  Service: Vascular;  Laterality: Left;   CYSTOSCOPY     CYSTOSCOPY WITH STENT PLACEMENT Bilateral 08/15/2022    Procedure: CYSTOSCOPY WITH STENT PLACEMENT;  Surgeon: Remi Haggard, MD;  Location: [place];  Service: Urology;  Laterality: Bilateral;   CYSTOSCOPY/URETEROSCOPY/HOLMIUM LASER/STENT PLACEMENT Bilateral 09/14/2022   Procedure: CYSTOSCOPY/URETEROSCOPY/HOLMIUM LASER/STENT PLACEMENT;  Surgeon: Raynelle Bring, MD;  Location: [facility];  Service: Urology;  Laterality: Bilateral;   DENTAL SURGERY     FINGER SURGERY     HERNIA REPAIR     at age 73   IR FLUORO GUIDE CV LINE RIGHT  03/09/2022   IR PATIENT EVAL TECH 0-60 MINS  03/09/2022   IR US GUIDE VASC ACCESS RIGHT  03/09/2022   KNEE ARTHROSCOPY Left    LITHOTRIPSY     NEPHROLITHOTOMY     SCROTAL EXPLORATION N/A 03/16/2022   Procedure: IRRIGATION AND DEBRIDEMENT SCROTUM;  Surgeon: Raynelle Bring, MD;  Location: [place];  Service: Urology;  Laterality: N/A;   SCROTAL EXPLORATION N/A 03/28/2022   Procedure: IRRIGATION AND DEBRIDEMENT SCROTUM;  Surgeon: Ardis Hughs, MD;  Location: [place];  Service: Urology;  Laterality: N/A;   TONSILLECTOMY     TOTAL KNEE ARTHROPLASTY Bilateral 11/04/2018   Procedure: BILATERAL TOTAL KNEE ARTHROPLASTY;  Surgeon: [name], MD;  Location: [place];  Service: Orthopedics;  Laterality: Bilateral;  spinal/epidural per anesthesiologist   TRANSMETATARSAL AMPUTATION Left 03/15/2022   Procedure: LEFT TRANSMETATARSAL AMPUTATION;  Surgeon: [name], MD;  Location: [place];  Service: Orthopedics;  Laterality: Left;   Patient Active Problem List   Diagnosis Date Noted   Suspected sleep apnea 10/01/2022   Daytime somnolence 10/01/2022   Snoring 10/01/2022   Acute bilateral obstructive uropathy 08/15/2022   Skin breakdown 07/29/2022   Suicidal ideations 06/09/2022   Chills (without fever) 06/09/2022   Severe depression ([place]) 06/09/2022   Acute cough 06/09/2022   End stage renal disease ([place]) 06/02/2022   Reactive depression 05/31/2022   Belching 05/27/2022   Gastroesophageal reflux disease 05/27/2022   Atrial  fibrillation ([place]) 05/20/2022   Long term (current) use of anticoagulants 05/20/2022   S/P transmetatarsal amputation of foot, left ([place]) 04/18/2022   CKD (chronic kidney disease) stage 5, GFR less than 15 ml/min ([place]) 04/18/2022   Secondary hyperparathyroidism of renal origin ([place]) 04/10/2022   Pruritus, unspecified 04/10/2022   Nausea 04/10/2022   Iron deficiency anemia, unspecified 04/10/2022   Hypothyroidism, unspecified 04/10/2022   Other acute kidney failure ([place]) 04/10/2022   Dyspnea, unspecified 96/28/3662   Eosinophilic esophagitis 94/76/5465   Gastric ulcer, unspecified as acute or chronic, without hemorrhage or perforation 04/10/2022   Coagulation defect, unspecified ([place]) 04/10/2022   Allergy, unspecified, initial encounter 04/10/2022   Anaphylactic shock, unspecified, initial encounter 04/10/2022   Arthralgia of right lower leg    Acute blood loss anemia    Unilateral complete BKA, right, initial encounter ([place]) 04/03/2022   S/P BKA (below knee amputation), right ([place]) 04/03/2022   Old myocardial infarction 04/02/2022   Dependence on renal dialysis ([place]) 04/02/2022   Acquired absence of right leg below knee ([place]) 04/02/2022   Cirrhosis ([place]) 03/31/2022   Fournier's gangrene in male    Scrotal infection 03/25/2022   Normocytic anemia 03/25/2022   Diarrhea 03/25/2022   Shock liver 03/25/2022   Acute respiratory failure with hypoxia ([place]) 03/25/2022   Gout 03/25/2022   Acute nonintractable headache 03/25/2022   Demand ischemia 03/25/2022   Thrombocytopenia ([place]) 03/25/2022   Obesity, Class III, BMI 40-49.9 (morbid obesity) ([place]) 03/25/2022   Cardiac arrest ([place]) 03/25/2022   Hypotension    Respiratory insufficiency    Cellulitis    Malnutrition of moderate degree 03/12/2022   Altered mental status    Gangrene of right foot ([place])    Gangrene of left foot ([place])    Severe protein-calorie malnutrition ([place]) 03/10/2022   Encounter for central line placement     Pressure injury of skin 02/23/2022   Septic shock ([place]) 02/22/2022   AKI (acute kidney injury) ([place])    Generalized abdominal pain    Lactic acidosis    Atrial fibrillation with RVR ([name])    Hypokalemia    Hypomagnesemia    Pain in finger of right hand 08/14/2021   Degenerative joint disease of shoulder region 05/07/2020   Olecranon bursitis of right elbow 05/07/2020   Pain in joint of right shoulder 04/26/2020   S/P TKR (total knee replacement), bilateral 11/04/2018   Bilateral primary osteoarthritis of knee    DDD (degenerative disc disease), cervical 01/10/2018   Rupture of medial head of gastrocnemius, left, sequela 01/14/2017   Kidney stones 06/22/2016   Essential hypertension 08/01/2010   DYSTHYMIC DISORDER 07/08/2010   PALPITATIONS 07/08/2010    REFERRING DIAG: Z89.511 (ICD-10-CM) - Hx of below knee amputation, right   ONSET DATE: 06/19/2022 Prosthesis delivery  THERAPY DIAG:  Other abnormalities of gait and mobility  Muscle weakness (generalized)  Unsteadiness on feet  Non-pressure chronic ulcer of skin of other sites with other specified severity ([place])  Foot drop, left  Rationale for Evaluation and Treatment Rehabilitation  PERTINENT HISTORY: Rt BKA 03/15/22, Left MTA 03/15/2022, bil. TKA 11/04/18, ESRD / chronic kidney disease st5 dialysis Tues/Thurs/Sat, cirrhosis, cardiac arrest May 23 post intubation, A-Fib  PRECAUTIONS: Fall, BP only in RUE starting 10/6  SUBJECTIVE:  He is getting catheter out tomorrow & wants to get down into tub.   PAIN:  Are you having pain? Distal residual limb  0/10 when sitting & standing 0/10 with gait    OBJECTIVE: (objective measures completed at initial evaluation unless otherwise dated)    COGNITION: 06/22/2022  Overall cognitive status: Within functional limits for tasks assessed and pt had flat affect so unable to fully assess cognition.   POSTURE: 06/22/2022  rounded shoulders, forward head, flexed trunk , and weight  shift left   LOWER EXTREMITY ROM:   ROM P:passive  A:active Right Eval 06/22/2022 Left Eval 06/22/2022  Hip flexion      Hip extension Standing A: ~30* limitation Standing A: ~30* limitation  Hip abduction      Hip adduction      Hip internal rotation      Hip external rotation      Knee flexion      Knee extension Standing A: ~15* limitation Standing A: ~15* limitation  Ankle dorsiflexion   Standing A: ~25* limitation  Ankle plantarflexion      Ankle inversion      Ankle eversion       (Blank rows = not tested)   LOWER EXTREMITY MMT:   MMT Right Eval 06/22/2022 Left Eval 06/22/2022  Hip flexion 4/5 4/5  Hip extension 2/5 functional 2/5 functional  Hip abduction 2/5 functional 2/5 functional  Hip adduction      Hip internal rotation      Hip external rotation      Knee flexion      Knee extension 3-/5 functional 3-/5 functional  Ankle dorsiflexion 2-/5    Ankle plantarflexion      Ankle inversion      Ankle eversion      (Blank rows = not tested)   TRANSFERS: 06/22/2022  Sit to stand: Mod A and from 22" w/c requiring armrests to push & RW to stabilize 06/22/2022  Stand to sit: Min A from RW to 22" w/c unable to release RW to reach for w/c until seated.   GAIT: 06/22/2022 Gait pattern: step to pattern, decreased step length- Left, decreased stance time- Right, decreased stride length, decreased hip/knee flexion- Right, circumduction- Right, Right hip hike, knee flexed in stance- Right, knee flexed in stance- Left, antalgic, trunk flexed, abducted- Right, and poor foot clearance- Left excessive weightbearing BUEs on RW Distance walked: 5' Assistive device utilized: Environmental consultant - 2 wheeled and right TTA prosthesis / left plate & shoe filler Level of assistance: Mod A Comments: post gait HR 82, SpO2 99% with dyspnea noted   FUNCTIONAL TESTs:  08/24/2022 Merrilee Jansky Balance 12/56   06/22/2022  Static standing balance with BUE support on RW for 1 min with minA.     CURRENT  PROSTHETIC WEAR ASSESSMENT: 06/22/2022 Patient is dependent with: skin check, residual limb care, care of non-amputated limb, prosthetic cleaning, ply sock cleaning, correct ply sock adjustment, proper wear schedule/adjustment, and proper weight-bearing schedule/adjustment Donning prosthesis: Total A and max cues required, Doffing prosthesis: Total  A and wife able to doffe Prosthetic wear tolerance: 1-2 hours 1-2x/day, for 3 of 3 days since delivery.  Prosthetic weight bearing tolerance: 0 minutes unable to stand outside of PT Edema: pitting with >5sec capillary refill Residual limb condition: 53m wound with serous drainage & black scab present,  calf measurement is 42.2cm Prosthetic description:  silicon liner with pin suspension, flexible inner socket Total Contact design, dynamic response foot with hydraulic PF/DF K code/activity level with prosthetic use: Level 3     TODAY'S TREATMENT:  10/08/2022 Therapeutic activity: Upon patient request PT demo and verbal cues on floor transfer using horizontal seat of chair for a half kneeling transition.  Patient able to return demonstration including all going from half kneeling to sitting on the floor with supervision.  Patient required min assist to go from sitting on the floor to a kneeling position.  Patient able to rise from the floor pushing on the chair bottom with contact-guard assistance.   Patient's request is to enable him to sit on the bottom of the tub for bath.  PT also discussed use of a stool as a transition.  PT recommending doffing the prosthesis once he is in the tub then filling it up and draining the tub and drying the bottom, donning the prosthesis prior to trying to get out of the tub.  Patient and wife verbalized understanding and agreement.  Sit to / from stand with PT demo and verbal cues on proper technique.  Patient performed 5 reps requiring touch or minimal assistance to stabilize balance.  PT instructed in working on this  with a kitchen table or counter in finding him.  PT recommended doing this 5-10 reps after each meal as he sits at the kitchen table to eat his meals.  Patient and wife verbalized understanding.  Prosthetic training with transtibial prosthesis: PT instructed patient in donning prosthesis with long pants and drying/adjusting socks while wearing long pants.  Patient and wife verbalized understanding.  Therapeutic exercise: Step up & down 4" block with counter & cane support: PT demo & verbal cues on technique. Pt performed 2 reps leading with ea LE. PT explained rationale for exercise & how to set up for HEP. Pt return demo and both verbalized understanding.    09/23/2022 Prosthetic Training with TTA prosthesis: PT recommended wearing shrinker over night. He is seeing prosthetist today after PT.  Request to be measured so size is correct for current limb size.  PT also instructed pt & wife in creating footed pajama pants for RLE which can help with both keeping shrinker on limb and warmth for decreasing phantom pain. Pt verbalized understanding.  Pt able to stad 2 min without UE support safely.  PT demo & verbal cues on step width / not abducting using line for visual, proprioception (4 steps correct & 2 steps incorrect) and correcting when feels wrong.  Pt ambulated 50' X 5 and 125' with SPC stand alone tip with minA / min guard.  Stairs with 2 rails: PT demo & verbal cues on technique.  Descend step-to patttern alternating lead LE for 6 steps and alternating 5 steps with CGA.  Ascend alternating with CGA.   09/16/2022 Prosthetic Training with TTA prosthesis: Wound healed & no pain reported PT reviewed donning suction suspension to get it all the way up. Pt verbalized understanding. PT instructed in sit to/from stand technique with demo.  Pt performed 10 reps pushing off 18" chair seat (no armrests used) and stabilizing without UE support  as goal.  He required occasional touch to stabilize or use  of back of legs against stable chair.  PT educated on how to set up with recommendation 10 reps 2-3 times per day.  Standing trunk flexion reaching under control to chair bottom anterior then reaching one arm to floor. Alternating lead UE and UE reaching to floor. PT recommended 5 reps per UE lead 1-2 x/day. Pt ambulated 150' with cane with min guard & no HHA. Verbal cues on upright posture looking forward. PT instructed in wife assisting without HHA.  If walking in hall keep right side near wall but do not touch unless off balance.  PT recommended walking program with wife in parking lot or inside church if weather indicates working on increasing distance with sets.  PT demo technique for backwards & sidestepping gait.  Pt performed 10' ea with cane with minA.  Pt & wife verbalized understanding of above activities for HEP.    HOME EXERCISE PROGRAM:  AT [facility] FIND YOUR MIDLINE POSITION AND PLACE FEET EQUAL DISTANCE FROM THE MIDLINE.  Try to find this position when standing still for activities.   USE TAPE ON FLOOR TO MARK THE MIDLINE POSITION which is even with middle of sink.  You also should try to feel with your limb pressure in socket.  You are trying to feel with limb what you used to feel with the bottom of your foot.  Side to Side Shift: Moving your hips only (not shoulders): move weight onto your left leg, HOLD/FEEL pressure in socket.  Move back to equal weight on each leg, HOLD/FEEL pressure in socket. Move weight onto your right leg, HOLD/FEEL pressure in socket. Move back to equal weight on each leg, HOLD/FEEL pressure in socket. Repeat.  Start with both hands on sink, progress to hand on prosthetic side only, then no hands.  Front to Back Shift: Moving your hips only (not shoulders): move your weight forward onto your toes, HOLD/FEEL pressure in socket. Move your weight back to equal Flat Foot on both legs, HOLD/FEEL  pressure in socket. Move your weight back onto your heels, HOLD/FEEL   pressure in socket. Move your weight back to equal on both legs, HOLD/FEEL  pressure in socket. Repeat.  Start with both hands on sink, progress to hand on prosthetic side only, then no hands.  Moving Cones / Cups: With equal weight on each leg: Hold on with one hand the first time, then progress to no hand supports. Move cups from one side of sink to the other. Place cups ~2" out of your reach, progress to 10" beyond reach.  Place one hand in middle of sink and reach with other hand. Do both arms.  Then hover one hand and move cups with other hand.  Overhead/Upward Reaching: alternated reaching up to top cabinets or ceiling if no cabinets present. Keep equal weight on each leg. Start with one hand support on counter while other hand reaches and progress to no hand support with reaching.  ace one hand in middle of sink and reach with other hand. Do both arms.  Then hover one hand and move cups with other hand.  5.   Looking Over Shoulders: With equal weight on each leg: alternate turning to look over your shoulders with one hand support on counter as needed.  Start with head motions only to look in front of shoulder, then even with shoulder and progress to looking behind you. To look to side, move head /eyes, then  shoulder on side looking pulls back, shift more weight to side looking and pull hip back. Place one hand in middle of sink and let go with other hand so your shoulder can pull back. Switch hands to look other way.   Then hover one hand and look over shoulder. If looking right, use left hand at sink. If looking left, use right hand at sink. 6.  Stepping with leg that is not amputated:  Move items under cabinet out of your way. Shift your hips/pelvis so weight on prosthesis. Tighten muscles in hip on prosthetic side.  SLOWLY step other leg so front of foot is in cabinet. Then step back to floor.   Pt also recommending sitting at edge of w/c with feet supported on floor but no back support each hour in  w/c until back fatigues. PT also demo & instructed in sitting on 24" stool. Pt & wife verbalized understanding.     ASSESSMENT:   CLINICAL IMPRESSION: Patient & wife appear to understand how to perform floor transfers which is precursor to getting into tub.  Pt also appears to understand sit to/from stand & 4" step up/down as HEP.   He continues to benefit from skilled PT.     OBJECTIVE IMPAIRMENTS Abnormal gait, cardiopulmonary status limiting activity, decreased activity tolerance, decreased balance, decreased endurance, decreased knowledge of condition, decreased knowledge of use of DME, decreased mobility, difficulty walking, decreased ROM, decreased strength, increased edema, impaired flexibility, postural dysfunction, and prosthetic dependency .    ACTIVITY LIMITATIONS carrying, lifting, bending, sitting, standing, squatting, stairs, transfers, locomotion level, and prosthesis use   PARTICIPATION LIMITATIONS: driving, community activity, occupation, yard work, and recreational of camping   [place], Past/current experiences, Time since onset of injury/illness/exacerbation, and 3+ comorbidities: see PMH  are also affecting patient's functional outcome.    REHAB POTENTIAL: Good   CLINICAL DECISION MAKING: Evolving/moderate complexity   EVALUATION COMPLEXITY: Moderate     GOALS: Goals reviewed with patient? Yes   SHORT TERM GOALS: Target date: 09/23/2022   Patient donnes new prosthesis modified independent & verbalizes adjusting ply socks.  Goal status: Met 09/23/2022 2.  Patient tolerates prosthesis >90% awake hrs /day without increase in skin issues & no limb pain. Goal status: Met 09/23/2022   3.  Patient able to stand 2 min without UE support with supervision. Goal status: Met 09/23/2022   4. Patient ambulates 1' with cane & prosthesis with minA. Goal status: Met 09/23/2022    UPDATED LONG TERM GOALS: Target date: 11/19/2022   Patient demonstrates &  verbalized understanding of prosthetic care to enable safe utilization of prosthesis. Goal status: Ongoing 08/24/2022  2.   Patient tolerates prosthesis wear >90% of awake hours without skin or limb pain issues. Goal status: Ongoing 08/24/2022   3.   Berg Balance >/= 36/56 to indicate lower fall risk Goal status: Ongoing 08/24/2022   4.   Patient ambulates >300' with prosthesis & LRAD independently Goal status:Ongoing 08/24/2022   5.   Patient negotiates ramps, curbs & stairs with single rail with prosthesis & LRAD independently. Goal status: Ongoing 08/24/2022     PLAN: PT FREQUENCY: 1x/week for 8 additional visits    PT DURATION: 12 weeks   PLANNED INTERVENTIONS: Therapeutic exercises, Therapeutic activity, Neuromuscular re-education, Balance training, Gait training, Patient/Family education, Self Care, Stair training, Vestibular training, Canalith repositioning, Prosthetic training, DME instructions, Vasopneumatic device, and physical performance testing   PLAN FOR NEXT SESSION: check on HEP from 11/16, progress balance &  gait activities, continue 1x/wk due to visit limit   Jamey Reas, PT, DPT 10/08/2022, 2:26 PM

## 2022-10-09 DIAGNOSIS — N186 End stage renal disease: Secondary | ICD-10-CM | POA: Diagnosis not present

## 2022-10-09 DIAGNOSIS — Z992 Dependence on renal dialysis: Secondary | ICD-10-CM | POA: Diagnosis not present

## 2022-10-09 DIAGNOSIS — Z452 Encounter for adjustment and management of vascular access device: Secondary | ICD-10-CM | POA: Diagnosis not present

## 2022-10-13 NOTE — Progress Notes (Unsigned)
Postoperative Access Visit   History of Present Illness   Jeffrey Ayala is a 61 y.o. year old male who presents for postoperative follow-up for:  Left arm brachial artery to cephalic vein AV fistula creation by Dr. Donzetta Matters on 08/28/22. The patient's wounds are well healed.  The patient notes no steal symptoms. He does have tenderness in area of incision. Since his hospitalization at time of fistula creation he has had improvement of his renal function. He is no longer needing dialysis at this time.His nephrologist is Dr. Marval Regal.  Physical Examination   Vitals:   10/14/22 1240  BP: 137/80  Pulse: 92  Temp: 97.8 F (36.6 C)  TempSrc: Temporal  SpO2: 100%  Weight: 200 lb (90.7 kg)  Height: '5\' 10"'$  (1.778 m)   Body mass index is 28.7 kg/m.  left arm Incision is well healed, 2+ radial pulse, hand grip is 5/5, sensation in digits is intact, palpable thrill, bruit can be auscultated    Non- Invasive studies: Findings:  +--------------------+----------+-----------------+--------+  AVF                PSV (cm/s)Flow Vol (mL/min)Comments  +--------------------+----------+-----------------+--------+  Native artery inflow   248          1347                 +--------------------+----------+-----------------+--------+  AVF Anastomosis        536                               +--------------------+----------+-----------------+--------+   +------------+----------+-------------+----------+----------------+  OUTFLOW VEINPSV (cm/s)Diameter (cm)Depth (cm)    Describe      +------------+----------+-------------+----------+----------------+  Shoulder      226        0.57        0.59                     +------------+----------+-------------+----------+----------------+  Prox UA        349        0.59        0.51                     +------------+----------+-------------+----------+----------------+  Mid UA         246        0.59        0.26                      +------------+----------+-------------+----------+----------------+  Dist UA        161        0.74        0.30   competing branch  +------------+----------+-------------+----------+----------------+  AC Fossa       623        0.65        0.38                     +------------+----------+-------------+----------+----------------+   Summary:  Patent arteriovenous fistula with one competing branch.   Medical Decision Making   Jeffrey Ayala is a 61 y.o. year old male who presents s/p Left arm brachial artery to cephalic vein AV fistula creation by Dr. Donzetta Matters on 08/28/22. Left AC incision well healed. Fistula has a great thrill and is easily palpable in left upper arm. Duplex shows patent fistula that has matured nicely. Patent is without signs or symptoms of steal syndrome. He  currently is not on HD as he has had improvement of renal function. He will continue follow up with Nephrologist, Dr. Marval Regal. The patient may follow up on a prn basis   Jeffrey Caldwell, PA-C [facility] Office: [phone]  Clinic MD: Yetta Barre

## 2022-10-14 ENCOUNTER — Encounter: Payer: Self-pay | Admitting: Physical Therapy

## 2022-10-14 ENCOUNTER — Ambulatory Visit (INDEPENDENT_AMBULATORY_CARE_PROVIDER_SITE_OTHER): Payer: BC Managed Care – PPO | Admitting: Physician Assistant

## 2022-10-14 ENCOUNTER — Ambulatory Visit (INDEPENDENT_AMBULATORY_CARE_PROVIDER_SITE_OTHER): Payer: BC Managed Care – PPO | Admitting: Physical Therapy

## 2022-10-14 ENCOUNTER — Ambulatory Visit (HOSPITAL_COMMUNITY)
Admission: RE | Admit: 2022-10-14 | Discharge: 2022-10-14 | Disposition: A | Discharge status: Home / Self Care | Payer: BC Managed Care – PPO | Source: Ambulatory Visit | Attending: Vascular Surgery | Admitting: Vascular Surgery

## 2022-10-14 ENCOUNTER — Encounter: Payer: Self-pay | Admitting: Physician Assistant

## 2022-10-14 VITALS — BP 137/80 | HR 92 | Temp 97.8°F | Ht 70.0 in | Wt 200.0 lb

## 2022-10-14 DIAGNOSIS — R2689 Other abnormalities of gait and mobility: Secondary | ICD-10-CM

## 2022-10-14 DIAGNOSIS — R2681 Unsteadiness on feet: Secondary | ICD-10-CM | POA: Diagnosis not present

## 2022-10-14 DIAGNOSIS — N186 End stage renal disease: Secondary | ICD-10-CM | POA: Insufficient documentation

## 2022-10-14 DIAGNOSIS — M79661 Pain in right lower leg: Secondary | ICD-10-CM

## 2022-10-14 DIAGNOSIS — M6281 Muscle weakness (generalized): Secondary | ICD-10-CM | POA: Diagnosis not present

## 2022-10-14 DIAGNOSIS — L98498 Non-pressure chronic ulcer of skin of other sites with other specified severity: Secondary | ICD-10-CM

## 2022-10-14 DIAGNOSIS — M21372 Foot drop, left foot: Secondary | ICD-10-CM

## 2022-10-14 NOTE — Therapy (Signed)
OUTPATIENT PHYSICAL THERAPY TREATMENT NOTE  Patient Name: [name] MRN: [id number] DOB:08-Oct-1961, 61 y.o., male Today's Date: 10/14/2022  PCP: [name], NP-C REFERRING PROVIDER: [name], MD   END OF SESSION:   PT End of Session - 10/14/22 0849     Visit Number 17    Number of Visits 20    Date for PT Re-Evaluation 11/20/22    Authorization Type [employer] comm PPO    Authorization Time Period 20 visit limit PT/OT/chiro, $35 co-pay    Authorization - Number of Visits 20    Progress Note Due on Visit 37    PT Start Time [phone]    PT Stop Time 0915    PT Time Calculation (min) 29 min    Equipment Utilized During Treatment Gait belt    Activity Tolerance Patient tolerated treatment well;Patient limited by fatigue    Behavior During Therapy WFL for tasks assessed/performed;Flat affect                   Past Medical History:  Diagnosis Date   A-fib ([place])    Anemia    Arthritis    Atrial fibrillation, chronic ([name])    not on [facility]   Cardiac arrest ([place]) 02/2022   in the setting of septic shock   Cirrhosis of liver ([place])    Concussion    Eosinophilic esophagitis    EGD/bx by Dr. Havery Moros   ESRD needing dialysis [facility])    GERD (gastroesophageal reflux disease)    History of gastric ulcer    History of kidney stones    Intermittent dysphagia    Scrotal infection 02/2022   Septic shock ([place]) 02/2022   Vasoplegia - > L TMA and R BKA   Past Surgical History:  Procedure Laterality Date   AMPUTATION Right 03/15/2022   Procedure: RIGHT BELOW KNEE AMPUTATION;  Surgeon: [name], MD;  Location: [place];  Service: Orthopedics;  Laterality: Right;   AV FISTULA PLACEMENT Left 08/28/2022   Procedure: LEFT ARM ARTERIOVENOUS (AV) FISTULA CREATION;  Surgeon: Waynetta Sandy, MD;  Location: [place];  Service: Vascular;  Laterality: Left;   CYSTOSCOPY     CYSTOSCOPY WITH STENT PLACEMENT Bilateral 08/15/2022   Procedure: CYSTOSCOPY WITH STENT  PLACEMENT;  Surgeon: Remi Haggard, MD;  Location: [place];  Service: Urology;  Laterality: Bilateral;   CYSTOSCOPY/URETEROSCOPY/HOLMIUM LASER/STENT PLACEMENT Bilateral 09/14/2022   Procedure: CYSTOSCOPY/URETEROSCOPY/HOLMIUM LASER/STENT PLACEMENT;  Surgeon: Raynelle Bring, MD;  Location: [facility];  Service: Urology;  Laterality: Bilateral;   DENTAL SURGERY     FINGER SURGERY     HERNIA REPAIR     at age 62   IR FLUORO GUIDE CV LINE RIGHT  03/09/2022   IR PATIENT EVAL TECH 0-60 MINS  03/09/2022   IR US GUIDE VASC ACCESS RIGHT  03/09/2022   KNEE ARTHROSCOPY Left    LITHOTRIPSY     NEPHROLITHOTOMY     SCROTAL EXPLORATION N/A 03/16/2022   Procedure: IRRIGATION AND DEBRIDEMENT SCROTUM;  Surgeon: Raynelle Bring, MD;  Location: [place];  Service: Urology;  Laterality: N/A;   SCROTAL EXPLORATION N/A 03/28/2022   Procedure: IRRIGATION AND DEBRIDEMENT SCROTUM;  Surgeon: Ardis Hughs, MD;  Location: [place];  Service: Urology;  Laterality: N/A;   TONSILLECTOMY     TOTAL KNEE ARTHROPLASTY Bilateral 11/04/2018   Procedure: BILATERAL TOTAL KNEE ARTHROPLASTY;  Surgeon: [name], MD;  Location: [place];  Service: Orthopedics;  Laterality: Bilateral;  spinal/epidural per anesthesiologist  TRANSMETATARSAL AMPUTATION Left 03/15/2022   Procedure: LEFT TRANSMETATARSAL AMPUTATION;  Surgeon: [name], MD;  Location: [place];  Service: Orthopedics;  Laterality: Left;   Patient Active Problem List   Diagnosis Date Noted   Suspected sleep apnea 10/01/2022   Daytime somnolence 10/01/2022   Snoring 10/01/2022   Acute bilateral obstructive uropathy 08/15/2022   Skin breakdown 07/29/2022   Suicidal ideations 06/09/2022   Chills (without fever) 06/09/2022   Severe depression ([place]) 06/09/2022   Acute cough 06/09/2022   End stage renal disease ([place]) 06/02/2022   Reactive depression 05/31/2022   Belching 05/27/2022   Gastroesophageal reflux disease 05/27/2022   Atrial fibrillation ([place]) 05/20/2022   Long  term (current) use of anticoagulants 05/20/2022   S/P transmetatarsal amputation of foot, left ([place]) 04/18/2022   CKD (chronic kidney disease) stage 5, GFR less than 15 ml/min ([place]) 04/18/2022   Secondary hyperparathyroidism of renal origin ([place]) 04/10/2022   Pruritus, unspecified 04/10/2022   Nausea 04/10/2022   Iron deficiency anemia, unspecified 04/10/2022   Hypothyroidism, unspecified 04/10/2022   Other acute kidney failure ([place]) 04/10/2022   Dyspnea, unspecified 00/92/3300   Eosinophilic esophagitis 76/22/6333   Gastric ulcer, unspecified as acute or chronic, without hemorrhage or perforation 04/10/2022   Coagulation defect, unspecified ([place]) 04/10/2022   Allergy, unspecified, initial encounter 04/10/2022   Anaphylactic shock, unspecified, initial encounter 04/10/2022   Arthralgia of right lower leg    Acute blood loss anemia    Unilateral complete BKA, right, initial encounter ([place]) 04/03/2022   S/P BKA (below knee amputation), right ([place]) 04/03/2022   Old myocardial infarction 04/02/2022   Dependence on renal dialysis ([place]) 04/02/2022   Acquired absence of right leg below knee ([place]) 04/02/2022   Cirrhosis ([place]) 03/31/2022   Fournier's gangrene in male    Scrotal infection 03/25/2022   Normocytic anemia 03/25/2022   Diarrhea 03/25/2022   Shock liver 03/25/2022   Acute respiratory failure with hypoxia ([place]) 03/25/2022   Gout 03/25/2022   Acute nonintractable headache 03/25/2022   Demand ischemia 03/25/2022   Thrombocytopenia ([place]) 03/25/2022   Obesity, Class III, BMI 40-49.9 (morbid obesity) ([place]) 03/25/2022   Cardiac arrest ([place]) 03/25/2022   Hypotension    Respiratory insufficiency    Cellulitis    Malnutrition of moderate degree 03/12/2022   Altered mental status    Gangrene of right foot ([place])    Gangrene of left foot ([place])    Severe protein-calorie malnutrition ([place]) 03/10/2022   Encounter for central line placement    Pressure injury of skin 02/23/2022   Septic  shock ([place]) 02/22/2022   AKI (acute kidney injury) ([place])    Generalized abdominal pain    Lactic acidosis    Atrial fibrillation with RVR ([name])    Hypokalemia    Hypomagnesemia    Pain in finger of right hand 08/14/2021   Degenerative joint disease of shoulder region 05/07/2020   Olecranon bursitis of right elbow 05/07/2020   Pain in joint of right shoulder 04/26/2020   S/P TKR (total knee replacement), bilateral 11/04/2018   Bilateral primary osteoarthritis of knee    DDD (degenerative disc disease), cervical 01/10/2018   Rupture of medial head of gastrocnemius, left, sequela 01/14/2017   Kidney stones 06/22/2016   Essential hypertension 08/01/2010   DYSTHYMIC DISORDER 07/08/2010   PALPITATIONS 07/08/2010    REFERRING DIAG: Z89.511 (ICD-10-CM) - Hx of below knee amputation, right   ONSET DATE: 06/19/2022 Prosthesis delivery  THERAPY DIAG:  Other abnormalities of gait and mobility  Muscle  weakness (generalized)  Unsteadiness on feet  Non-pressure chronic ulcer of skin of other sites with other specified severity ([place])  Foot drop, left  Pain in right lower leg  Rationale for Evaluation and Treatment Rehabilitation  PERTINENT HISTORY: Rt BKA 03/15/22, Left MTA 03/15/2022, bil. TKA 11/04/18, ESRD / chronic kidney disease st5 dialysis Tues/Thurs/Sat, cirrhosis, cardiac arrest May 23 post intubation, A-Fib  PRECAUTIONS: Fall, BP only in RUE starting 10/6  SUBJECTIVE:  He noticed wound on limb 2 days ago and saw prosthetist who adjusted socket.  He has been wearing prosthesis all awake hours before & after noticing area but limiting walking after dud to pain.   PAIN:  Are you having pain? Distal residual limb  1-2/10 when sitting and  8-9/10 with standing & gait    OBJECTIVE: (objective measures completed at initial evaluation unless otherwise dated)    COGNITION: 06/22/2022  Overall cognitive status: Within functional limits for tasks assessed and pt had flat affect so  unable to fully assess cognition.   POSTURE: 06/22/2022  rounded shoulders, forward head, flexed trunk , and weight shift left   LOWER EXTREMITY ROM:   ROM P:passive  A:active Right Eval 06/22/2022 Left Eval 06/22/2022  Hip flexion      Hip extension Standing A: ~30* limitation Standing A: ~30* limitation  Hip abduction      Hip adduction      Hip internal rotation      Hip external rotation      Knee flexion      Knee extension Standing A: ~15* limitation Standing A: ~15* limitation  Ankle dorsiflexion   Standing A: ~25* limitation  Ankle plantarflexion      Ankle inversion      Ankle eversion       (Blank rows = not tested)   LOWER EXTREMITY MMT:   MMT Right Eval 06/22/2022 Left Eval 06/22/2022  Hip flexion 4/5 4/5  Hip extension 2/5 functional 2/5 functional  Hip abduction 2/5 functional 2/5 functional  Hip adduction      Hip internal rotation      Hip external rotation      Knee flexion      Knee extension 3-/5 functional 3-/5 functional  Ankle dorsiflexion 2-/5    Ankle plantarflexion      Ankle inversion      Ankle eversion      (Blank rows = not tested)   TRANSFERS: 06/22/2022  Sit to stand: Mod A and from 22" w/c requiring armrests to push & RW to stabilize 06/22/2022  Stand to sit: Min A from RW to 22" w/c unable to release RW to reach for w/c until seated.   GAIT: 06/22/2022 Gait pattern: step to pattern, decreased step length- Left, decreased stance time- Right, decreased stride length, decreased hip/knee flexion- Right, circumduction- Right, Right hip hike, knee flexed in stance- Right, knee flexed in stance- Left, antalgic, trunk flexed, abducted- Right, and poor foot clearance- Left excessive weightbearing BUEs on RW Distance walked: 5' Assistive device utilized: Environmental consultant - 2 wheeled and right TTA prosthesis / left plate & shoe filler Level of assistance: Mod A Comments: post gait HR 82, SpO2 99% with dyspnea noted   FUNCTIONAL TESTs:  08/24/2022  Merrilee Jansky Balance 12/56   06/22/2022  Static standing balance with BUE support on RW for 1 min with minA.     CURRENT PROSTHETIC WEAR ASSESSMENT: 06/22/2022 Patient is dependent with: skin check, residual limb care, care of non-amputated limb, prosthetic cleaning, ply sock cleaning,  correct ply sock adjustment, proper wear schedule/adjustment, and proper weight-bearing schedule/adjustment Donning prosthesis: Total A and max cues required, Doffing prosthesis: Total A and wife able to doffe Prosthetic wear tolerance: 1-2 hours 1-2x/day, for 3 of 3 days since delivery.  Prosthetic weight bearing tolerance: 0 minutes unable to stand outside of PT Edema: pitting with >5sec capillary refill Residual limb condition: 29m wound with serous drainage & black scab present,  calf measurement is 42.2cm Prosthetic description:  silicon liner with pin suspension, flexible inner socket Total Contact design, dynamic response foot with hydraulic PF/DF K code/activity level with prosthetic use: Level 3     TODAY'S TREATMENT:  10/14/2022 Prosthetic Training with TTA prosthesis: Pt arrived with new wound noted in subjective. Linear 52mwound on incision lateral distal tibia. No redness or warmth noted. But area is edematous & very painful with palpation. PT questioning if wound healed externally first & created a fluid pocket. He has appt at [facility] at 12:45 today. Pt arrived with inner flexible liner rotated externally and socket was properly aligned. PT pointed this issue out to pt & family and fact his limb was not seated fully into socket. Both of these facts make pressure tolerant areas in incorrect area. PT demo & verbal cues on donning correctly & need to stand enough to seat limb into socket. Pt verbalized understanding. PT also noticed some superficial scratches on anterior limb.  Pt does report typical intense itching with removing prosthesis causing him to scratch his limb.  PT recommended having a cloth  preferably a wet warm cloth handy when he is removing the prosthesis in order to rub them instead of scratch it with his nails.  PT also recommended during the day every 4-6 hours removing the prosthesis to dry the limb as that superficial light sweating can be the cause of itching.  Patient and wife verbalized understanding. PT recommended limiting weightbearing for now until some of the pain subsides due to how high the pain number is.  PT recommended using the walker as she can limit weight on his residual limb more with a walker than he can with a cane.  Patient and wife verbalized understanding. PT ended session early as further standing activities appear would increase his pain and therefore limit his activity progression.  10/08/2022 Therapeutic activity: Upon patient request PT demo and verbal cues on floor transfer using horizontal seat of chair for a half kneeling transition.  Patient able to return demonstration including all going from half kneeling to sitting on the floor with supervision.  Patient required min assist to go from sitting on the floor to a kneeling position.  Patient able to rise from the floor pushing on the chair bottom with contact-guard assistance.   Patient's request is to enable him to sit on the bottom of the tub for bath.  PT also discussed use of a stool as a transition.  PT recommending doffing the prosthesis once he is in the tub then filling it up and draining the tub and drying the bottom, donning the prosthesis prior to trying to get out of the tub.  Patient and wife verbalized understanding and agreement.  Sit to / from stand with PT demo and verbal cues on proper technique.  Patient performed 5 reps requiring touch or minimal assistance to stabilize balance.  PT instructed in working on this with a kitchen table or counter in finding him.  PT recommended doing this 5-10 reps after each meal as he sits at the  kitchen table to eat his meals.  Patient and wife  verbalized understanding.  Prosthetic training with transtibial prosthesis: PT instructed patient in donning prosthesis with long pants and drying/adjusting socks while wearing long pants.  Patient and wife verbalized understanding.  Therapeutic exercise: Step up & down 4" block with counter & cane support: PT demo & verbal cues on technique. Pt performed 2 reps leading with ea LE. PT explained rationale for exercise & how to set up for HEP. Pt return demo and both verbalized understanding.    09/23/2022 Prosthetic Training with TTA prosthesis: PT recommended wearing shrinker over night. He is seeing prosthetist today after PT.  Request to be measured so size is correct for current limb size.  PT also instructed pt & wife in creating footed pajama pants for RLE which can help with both keeping shrinker on limb and warmth for decreasing phantom pain. Pt verbalized understanding.  Pt able to stad 2 min without UE support safely.  PT demo & verbal cues on step width / not abducting using line for visual, proprioception (4 steps correct & 2 steps incorrect) and correcting when feels wrong.  Pt ambulated 50' X 5 and 125' with SPC stand alone tip with minA / min guard.  Stairs with 2 rails: PT demo & verbal cues on technique.  Descend step-to patttern alternating lead LE for 6 steps and alternating 5 steps with CGA.  Ascend alternating with CGA.   HOME EXERCISE PROGRAM:  AT [facility] FIND YOUR MIDLINE POSITION AND PLACE FEET EQUAL DISTANCE FROM THE MIDLINE.  Try to find this position when standing still for activities.   USE TAPE ON FLOOR TO MARK THE MIDLINE POSITION which is even with middle of sink.  You also should try to feel with your limb pressure in socket.  You are trying to feel with limb what you used to feel with the bottom of your foot.  Side to Side Shift: Moving your hips only (not shoulders): move weight onto your left leg, HOLD/FEEL pressure in socket.  Move back to equal weight on each  leg, HOLD/FEEL pressure in socket. Move weight onto your right leg, HOLD/FEEL pressure in socket. Move back to equal weight on each leg, HOLD/FEEL pressure in socket. Repeat.  Start with both hands on sink, progress to hand on prosthetic side only, then no hands.  Front to Back Shift: Moving your hips only (not shoulders): move your weight forward onto your toes, HOLD/FEEL pressure in socket. Move your weight back to equal Flat Foot on both legs, HOLD/FEEL  pressure in socket. Move your weight back onto your heels, HOLD/FEEL  pressure in socket. Move your weight back to equal on both legs, HOLD/FEEL  pressure in socket. Repeat.  Start with both hands on sink, progress to hand on prosthetic side only, then no hands.  Moving Cones / Cups: With equal weight on each leg: Hold on with one hand the first time, then progress to no hand supports. Move cups from one side of sink to the other. Place cups ~2" out of your reach, progress to 10" beyond reach.  Place one hand in middle of sink and reach with other hand. Do both arms.  Then hover one hand and move cups with other hand.  Overhead/Upward Reaching: alternated reaching up to top cabinets or ceiling if no cabinets present. Keep equal weight on each leg. Start with one hand support on counter while other hand reaches and progress to no hand support with reaching.  ace one hand in middle of sink and reach with other hand. Do both arms.  Then hover one hand and move cups with other hand.  5.   Looking Over Shoulders: With equal weight on each leg: alternate turning to look over your shoulders with one hand support on counter as needed.  Start with head motions only to look in front of shoulder, then even with shoulder and progress to looking behind you. To look to side, move head /eyes, then shoulder on side looking pulls back, shift more weight to side looking and pull hip back. Place one hand in middle of sink and let go with other hand so your shoulder can pull  back. Switch hands to look other way.   Then hover one hand and look over shoulder. If looking right, use left hand at sink. If looking left, use right hand at sink. 6.  Stepping with leg that is not amputated:  Move items under cabinet out of your way. Shift your hips/pelvis so weight on prosthesis. Tighten muscles in hip on prosthetic side.  SLOWLY step other leg so front of foot is in cabinet. Then step back to floor.   Pt also recommending sitting at edge of w/c with feet supported on floor but no back support each hour in w/c until back fatigues. PT also demo & instructed in sitting on 24" stool. Pt & wife verbalized understanding.     ASSESSMENT:   CLINICAL IMPRESSION: Patient has new wound and significant pain on residual limb today.  PT educated patient and wife on where, weightbearing and limb care which they appear to understand.  Patient may have a pocket of fluid that can become infected as the wound healed externally leaving the pocket of fluid internally.  Patient is seeing an MD today that should be able to address this issue.  He continues to benefit from skilled PT.     OBJECTIVE IMPAIRMENTS Abnormal gait, cardiopulmonary status limiting activity, decreased activity tolerance, decreased balance, decreased endurance, decreased knowledge of condition, decreased knowledge of use of DME, decreased mobility, difficulty walking, decreased ROM, decreased strength, increased edema, impaired flexibility, postural dysfunction, and prosthetic dependency .    ACTIVITY LIMITATIONS carrying, lifting, bending, sitting, standing, squatting, stairs, transfers, locomotion level, and prosthesis use   PARTICIPATION LIMITATIONS: driving, community activity, occupation, yard work, and recreational of camping   [place], Past/current experiences, Time since onset of injury/illness/exacerbation, and 3+ comorbidities: see PMH  are also affecting patient's functional outcome.    REHAB  POTENTIAL: Good   CLINICAL DECISION MAKING: Evolving/moderate complexity   EVALUATION COMPLEXITY: Moderate     GOALS: Goals reviewed with patient? Yes   SHORT TERM GOALS: Target date: 09/23/2022   Patient donnes new prosthesis modified independent & verbalizes adjusting ply socks.  Goal status: Met 09/23/2022 2.  Patient tolerates prosthesis >90% awake hrs /day without increase in skin issues & no limb pain. Goal status: Met 09/23/2022   3.  Patient able to stand 2 min without UE support with supervision. Goal status: Met 09/23/2022   4. Patient ambulates 62' with cane & prosthesis with minA. Goal status: Met 09/23/2022    UPDATED LONG TERM GOALS: Target date: 11/19/2022   Patient demonstrates & verbalized understanding of prosthetic care to enable safe utilization of prosthesis. Goal status: Ongoing 08/24/2022  2.   Patient tolerates prosthesis wear >90% of awake hours without skin or limb pain issues. Goal status: Ongoing 08/24/2022   3.   Berg Balance >/=  36/56 to indicate lower fall risk Goal status: Ongoing 08/24/2022   4.   Patient ambulates >300' with prosthesis & LRAD independently Goal status:Ongoing 08/24/2022   5.   Patient negotiates ramps, curbs & stairs with single rail with prosthesis & LRAD independently. Goal status: Ongoing 08/24/2022     PLAN: PT FREQUENCY: 1x/week for 8 additional visits    PT DURATION: 12 weeks   PLANNED INTERVENTIONS: Therapeutic exercises, Therapeutic activity, Neuromuscular re-education, Balance training, Gait training, Patient/Family education, Self Care, Stair training, Vestibular training, Canalith repositioning, Prosthetic training, DME instructions, Vasopneumatic device, and physical performance testing   PLAN FOR NEXT SESSION: Check on wound and MD appointment, progress balance & gait activities, continue 1x/wk due to visit limit   Jamey Reas, PT, DPT 10/14/2022, 9:30 AM

## 2022-10-19 ENCOUNTER — Ambulatory Visit (HOSPITAL_COMMUNITY)
Admission: RE | Admit: 2022-10-19 | Discharge: 2022-10-19 | Disposition: A | Discharge status: Home / Self Care | Payer: BC Managed Care – PPO | Source: Ambulatory Visit | Attending: Nephrology | Admitting: Nephrology

## 2022-10-19 ENCOUNTER — Encounter (HOSPITAL_COMMUNITY): Payer: Self-pay | Admitting: Gastroenterology

## 2022-10-19 VITALS — BP 117/69 | HR 83 | Temp 97.4°F | Resp 17

## 2022-10-19 DIAGNOSIS — N183 Chronic kidney disease, stage 3 unspecified: Secondary | ICD-10-CM | POA: Diagnosis not present

## 2022-10-19 DIAGNOSIS — N186 End stage renal disease: Secondary | ICD-10-CM | POA: Diagnosis present

## 2022-10-19 DIAGNOSIS — N185 Chronic kidney disease, stage 5: Secondary | ICD-10-CM | POA: Diagnosis present

## 2022-10-19 DIAGNOSIS — D509 Iron deficiency anemia, unspecified: Secondary | ICD-10-CM | POA: Diagnosis present

## 2022-10-19 DIAGNOSIS — D631 Anemia in chronic kidney disease: Secondary | ICD-10-CM | POA: Diagnosis not present

## 2022-10-19 LAB — POCT HEMOGLOBIN-HEMACUE: Hemoglobin: 9.1 g/dL — ABNORMAL LOW (ref 13.0–17.0)

## 2022-10-19 MED ORDER — EPOETIN ALFA-EPBX 10000 UNIT/ML IJ SOLN
20000.0000 [IU] | INTRAMUSCULAR | Status: DC
  Administered 2022-10-19: 20000 [IU] via SUBCUTANEOUS

## 2022-10-19 MED ORDER — EPOETIN ALFA-EPBX 10000 UNIT/ML IJ SOLN
INTRAMUSCULAR | Status: AC
  Filled 2022-10-19: qty 2

## 2022-10-21 ENCOUNTER — Telehealth: Payer: Self-pay | Admitting: Family Medicine

## 2022-10-21 NOTE — Telephone Encounter (Signed)
Patients insurance requires a medical review over the phone at [phone]. Please let me know and I can update sleep study center

## 2022-10-21 NOTE — Telephone Encounter (Signed)
[facility] Called stating that per the patient's insurance the patient needs a Medical Review over the phone with Vickie or her nurse for the patient to be able to have a At-Home Sleep Study. Phone number to call is [phone]. Terri from [facility] also stated if she can get a call back and kept up-to-date on the review so the patient can get scheduled. Terri's number is [phone].

## 2022-10-22 NOTE — Telephone Encounter (Signed)
Terri called back and was able to relay pt was approved, relayed authorization number and dates authorized

## 2022-10-22 NOTE — Progress Notes (Signed)
Cardiology Office Note:   Date:  10/28/2022  NAME:  Jeffrey Ayala    MRN: [id number] DOB:  13-Sep-1961   PCP:  [name], NP-C  Cardiologist:  Evalina Field, MD  Electrophysiologist:  None   Referring MD: [name], NP-C   Chief Complaint  Patient presents with   Follow-up         History of Present Illness:   Jeffrey Ayala is a 62 y.o. male with a hx of ESRD on hemodialysis, right BKA, paroxysmal atrial fibrillation who presents for follow-up.  Has had issues with anemia.  Anticoagulation has been held.  Unable to pursue cardioversion due to recurrent anemia.  EGD/Colonoscopy with colonic polyps. No active bleeding.  He denies chest pain or trouble breathing.  Heart rate controlled on metoprolol.  Having some fluctuations in blood pressure but stable overall.  He has been off hemodialysis for the past 2 months.  Seems to be doing well.  Making urine.  He has gained weight.  Does not appear to be volume overloaded.  We discussed going on Eliquis and pursuing a cardioversion.  No signs of bleeding.  Polyps were found on EGD and colonoscopy.  I have reached out to his GI doctor.  Problem List Cardiac Arrest -2/2 septic shock 02/22/2022-04/03/2022 -scrotal infection  -2/2 troponin elevation, normal echo 2. AKI on HD -TTS perm cath  3. LE gangrene -s/p L transmetatarsal amputation, R BKA -2/2 pressors from shock  4. Paroxysmal Afib -CHADSVASC=2 (CAD, HTN)  5. Anemia -AC on hold  6. Cirrhosis  Past Medical History: Past Medical History:  Diagnosis Date   A-fib ([place])    Acute nonintractable headache 03/25/2022   Anemia    Arthritis    Atrial fibrillation, chronic ([name])    not on [facility]   Cardiac arrest ([place]) 02/2022   in the setting of septic shock   Cirrhosis of liver ([name])    Concussion    Dysrhythmia    afib   Eosinophilic esophagitis    EGD/bx by Dr. Havery Moros   ESRD needing dialysis [facility])    GERD (gastroesophageal reflux disease)    History of  gastric ulcer    History of kidney stones    Intermittent dysphagia    Scrotal infection 02/2022   Septic shock ([place]) 02/2022   Vasoplegia - > L TMA and R BKA    Past Surgical History: Past Surgical History:  Procedure Laterality Date   AMPUTATION Right 03/15/2022   Procedure: RIGHT BELOW KNEE AMPUTATION;  Surgeon: [name], MD;  Location: [place];  Service: Orthopedics;  Laterality: Right;   AV FISTULA PLACEMENT Left 08/28/2022   Procedure: LEFT ARM ARTERIOVENOUS (AV) FISTULA CREATION;  Surgeon: Waynetta Sandy, MD;  Location: [place];  Service: Vascular;  Laterality: Left;   CYSTOSCOPY     CYSTOSCOPY WITH STENT PLACEMENT Bilateral 08/15/2022   Procedure: CYSTOSCOPY WITH STENT PLACEMENT;  Surgeon: Remi Haggard, MD;  Location: [place];  Service: Urology;  Laterality: Bilateral;   CYSTOSCOPY/URETEROSCOPY/HOLMIUM LASER/STENT PLACEMENT Bilateral 09/14/2022   Procedure: CYSTOSCOPY/URETEROSCOPY/HOLMIUM LASER/STENT PLACEMENT;  Surgeon: Raynelle Bring, MD;  Location: [facility];  Service: Urology;  Laterality: Bilateral;   DENTAL SURGERY     FINGER SURGERY     HERNIA REPAIR     at age 36   IR FLUORO GUIDE CV LINE RIGHT  03/09/2022   IR PATIENT EVAL TECH 0-60 MINS  03/09/2022   IR US GUIDE VASC ACCESS RIGHT  03/09/2022  KNEE ARTHROSCOPY Left    LITHOTRIPSY     NEPHROLITHOTOMY     SCROTAL EXPLORATION N/A 03/16/2022   Procedure: IRRIGATION AND DEBRIDEMENT SCROTUM;  Surgeon: Raynelle Bring, MD;  Location: [place];  Service: Urology;  Laterality: N/A;   SCROTAL EXPLORATION N/A 03/28/2022   Procedure: IRRIGATION AND DEBRIDEMENT SCROTUM;  Surgeon: Ardis Hughs, MD;  Location: [place];  Service: Urology;  Laterality: N/A;   TONSILLECTOMY     TOTAL KNEE ARTHROPLASTY Bilateral 11/04/2018   Procedure: BILATERAL TOTAL KNEE ARTHROPLASTY;  Surgeon: [name], MD;  Location: [place];  Service: Orthopedics;  Laterality: Bilateral;  spinal/epidural per anesthesiologist   TRANSMETATARSAL  AMPUTATION Left 03/15/2022   Procedure: LEFT TRANSMETATARSAL AMPUTATION;  Surgeon: [name], MD;  Location: [place];  Service: Orthopedics;  Laterality: Left;    Current Medications: Current Meds  Medication Sig   acetaminophen (TYLENOL) 500 MG tablet Take 1,000 mg by mouth every 6 (six) hours as needed for moderate pain.   allopurinol (ZYLOPRIM) 100 MG tablet TAKE 1 TABLET BY MOUTH EVERY DAY   diphenhydramine-acetaminophen (TYLENOL PM) 25-500 MG TABS tablet Take 1 tablet by mouth at bedtime as needed (sleep).   furosemide (LASIX) 40 MG tablet Take 40 mg by mouth.   gabapentin (NEURONTIN) 100 MG capsule Take 100 mg by mouth 2 (two) times daily.   metoprolol tartrate (LOPRESSOR) 25 MG tablet Take 12.5 mg by mouth 2 (two) times daily.   oxyCODONE-acetaminophen (PERCOCET) 5-325 MG tablet Take 1 tablet by mouth every 6 (six) hours as needed for severe pain.   pantoprazole (PROTONIX) 40 MG tablet Take 1 tablet (40 mg total) by mouth 2 (two) times daily. (Patient taking differently: Take 40 mg by mouth daily.)   traZODone (DESYREL) 50 MG tablet TAKE 1/2 OR 1 TABLET BY MOUTH AT BEDTIME AS NEEDED FOR SLEEP (Patient taking differently: Take 50 mg by mouth at bedtime.)     Allergies:    Codeine and Flomax [tamsulosin hcl]   Social History: Social History   Socioeconomic History   Marital status: Married    Spouse name: Not on file   Number of children: 2   Years of education: Not on file   Highest education level: Not on file  Occupational History   Occupation: Press photographer - Clinical cytogeneticist  Tobacco Use   Smoking status: Never   Smokeless tobacco: Never  Vaping Use   Vaping Use: Never used  Substance and Sexual Activity   Alcohol use: Not Currently    Comment: occassional   Drug use: No   Sexual activity: Not Currently  Other Topics Concern   Not on file  Social History Narrative   Not on file   Social Determinants of Health   Financial Resource Strain: Not on file  Food  Insecurity: No Food Insecurity (08/19/2022)   Hunger Vital Sign    Worried About Running Out of Food in the Last Year: Never true    Ran Out of Food in the Last Year: Never true  Transportation Needs: No Transportation Needs (08/19/2022)   PRAPARE - Hydrologist (Medical): No    Lack of Transportation (Non-Medical): No  Physical Activity: Not on file  Stress: Not on file  Social Connections: Not on file     Family History: The patient's family history includes Arrhythmia in his mother; COPD in his mother; Cancer in his brother; Sarcoidosis in his brother. There is no history of Stomach cancer, Pancreatic cancer, Esophageal cancer,  Colon cancer, or Rectal cancer.  ROS:   All other ROS reviewed and negative. Pertinent positives noted in the HPI.     EKGs/Labs/Other Studies Reviewed:   The following studies were personally reviewed by me today:  TTE 02/23/2022  1. Left ventricular ejection fraction, by estimation, is 60 to 65%. The  left ventricle has normal function. The left ventricle has no regional  wall motion abnormalities. There is mild left ventricular hypertrophy.  Left ventricular diastolic function  could not be evaluated.   2. Right ventricular systolic function is normal. The right ventricular  size is normal.   3. The mitral valve is normal in structure. No evidence of mitral valve  regurgitation. No evidence of mitral stenosis.   4. The aortic valve is tricuspid. Aortic valve regurgitation is not  visualized. No aortic stenosis is present.   5. The inferior vena cava is dilated in size with <50% respiratory  variability, suggesting right atrial pressure of 15 mmHg.   Recent Labs: 02/22/2022: B Natriuretic Peptide 307.0; TSH 0.687 04/18/2022: Magnesium 1.9 08/15/2022: ALT 16 09/14/2022: BUN 20; Creatinine, Ser 3.08; Potassium 3.5; Sodium 139 10/01/2022: Platelets 172.0 10/19/2022: Hemoglobin 9.1   Recent Lipid Panel    Component Value  Date/Time   TRIG 159 (H) 03/17/2022 0754    Physical Exam:   VS:  BP (!) 148/88   Pulse 65   Ht '5\' 10"'$  (1.778 m)   Wt 230 lb (104.3 kg)   SpO2 99%   BMI 33.00 kg/m    Wt Readings from Last 3 Encounters:  10/28/22 230 lb (104.3 kg)  10/26/22 200 lb (90.7 kg)  10/14/22 200 lb (90.7 kg)    General: Well nourished, well developed, in no acute distress Head: Atraumatic, normal size  Eyes: PEERLA, EOMI  Neck: Supple, no JVD Endocrine: No thryomegaly Cardiac: Normal S1, S2; irregular rhythm Lungs: Clear to auscultation bilaterally, no wheezing, rhonchi or rales  Abd: Soft, nontender, no hepatomegaly  Ext: Status post right BKA Musculoskeletal: No deformities, BUE and BLE strength normal and equal Skin: Warm and dry, no rashes   Neuro: Alert and oriented to person, place, time, and situation, CNII-XII grossly intact, no focal deficits  Psych: Normal mood and affect   ASSESSMENT:   Jeffrey Ayala is a 61 y.o. male who presents for the following: 1. Persistent atrial fibrillation ([place])     PLAN:   1. Persistent atrial fibrillation ([name]) -Remains in rate controlled atrial fibrillation metoprolol tartrate 25 mg twice daily.  We have been unable to pursue cardioversion due to persistent anemia.  Now off hemodialysis.  EGD and colonoscopy with polyps.  They have been removed.  I will reach out to his GI doctor to determine if we can start Eliquis.  We will then pursue cardioversion on 11/20/2022.  He needs an attempt at restoration of sinus rhythm.  Echo was normal.  He has been diagnosed with cirrhosis but not a drinker.  Suspect this could be related to obesity.  No signs of varices on recent EGD and colonoscopy.  He will see me back in 2 to 3 weeks after his cardioversion.   Shared Decision Making/Informed Consent The risks (stroke, cardiac arrhythmias rarely resulting in the need for a temporary or permanent pacemaker, skin irritation or burns and complications associated with  conscious sedation including aspiration, arrhythmia, respiratory failure and death), benefits (restoration of normal sinus rhythm) and alternatives of a direct current cardioversion were explained in detail to Mr. Royce Macadamia and he agrees  to proceed.    Disposition: Return in about 6 weeks (around 12/09/2022).  Medication Adjustments/Labs and Tests Ordered: Current medicines are reviewed at length with the patient today.  Concerns regarding medicines are outlined above.  Orders Placed This Encounter  Procedures   CBC   Basic metabolic panel   No orders of the defined types were placed in this encounter.   Patient Instructions  Medication Instructions:  START Eliquis when he calls to instruct you to start.   *If you need a refill on your cardiac medications before your next appointment, please call your pharmacy*   Lab Work: CBC, BMET today   If you have labs (blood work) drawn today and your tests are completely normal, you will receive your results only by: [place] (if you have MyChart) OR A paper copy in the mail If you have any lab test that is abnormal or we need to change your treatment, we will call you to review the results.   Testing/Procedures: Your physician has requested that you have a Cardioversion.  Electrical Cardioversion uses a jolt of electricity to your heart either through paddles or wired patches attached to your chest. This is a controlled, usually prescheduled, procedure. This procedure is done at the hospital and you are not awake during the procedure. You usually go home the day of the procedure. Please see the instruction sheet given to you today for more information.    Follow-Up: At [facility], you and your health needs are our priority.  As part of our continuing mission to provide you with exceptional heart care, we have created designated Provider Care Teams.  These Care Teams include your primary Cardiologist (physician) and Advanced  Practice Providers (APPs -  Physician Assistants and Nurse Practitioners) who all work together to provide you with the care you need, when you need it.  We recommend signing up for the patient portal called "MyChart".  Sign up information is provided on this After Visit Summary.  MyChart is used to connect with patients for Virtual Visits (Telemedicine).  Patients are able to view lab/test results, encounter notes, upcoming appointments, etc.  Non-urgent messages can be sent to your provider as well.   To learn more about what you can do with MyChart, go to [email or web address].    Your next appointment:   December 04, 2022 at 9:00 AM  The format for your next appointment:   In Person  Provider:   Evalina Field, MD     Other Instructions    Dear [name]  You are scheduled for a Cardioversion on Friday, December 29 with Dr. Johney Frame.  Please arrive at the [facility] (Main Entrance A) at [facility]: [address] [place], [place] [address] at 9:00 AM.   DIET:  Nothing to eat or drink after midnight except a sip of water with medications (see medication instructions below)  MEDICATION INSTRUCTIONS: Continue Eliquis when you start.   LABS: CBC, BMET today   FYI:  For your safety, and to allow [place] to monitor your vital signs accurately during the surgery/procedure we request: If you have artificial nails, gel coating, SNS etc, please have those removed prior to your surgery/procedure. Not having the nail coverings /polish removed may result in cancellation or delay of your surgery/procedure.  You must have a responsible person to drive you home and stay in the waiting area during your procedure. Failure to do so could result in cancellation.  Bring your insurance cards.  *  Special Note: Every effort is made to have your procedure done on time. Occasionally there are emergencies that occur at the hospital that may cause delays. Please be patient if a delay does  occur.              Time Spent with Patient: I have spent a total of 25 minutes with patient reviewing hospital notes, telemetry, EKGs, labs and examining the patient as well as establishing an assessment and plan that was discussed with the patient.  > 50% of time was spent in direct patient care.  Signed, Addison Naegeli. Audie Box, MD, [place]  [address], [place], [place] [address] [phone]  10/28/2022 9:22 AM

## 2022-10-22 NOTE — H&P (View-Only) (Signed)
Cardiology Office Note:   Date:  10/28/2022  NAME:  Jeffrey Ayala    MRN: [id number] DOB:  [birth year]   PCP:  [name], NP-C  Cardiologist:  Evalina Field, MD  Electrophysiologist:  None   Referring MD: [name], NP-C   Chief Complaint  Patient presents with   Follow-up         History of Present Illness:   Jeffrey Ayala is a 61 y.o. male with a hx of ESRD on hemodialysis, right BKA, paroxysmal atrial fibrillation who presents for follow-up.  Has had issues with anemia.  Anticoagulation has been held.  Unable to pursue cardioversion due to recurrent anemia.  EGD/Colonoscopy with colonic polyps. No active bleeding.  He denies chest pain or trouble breathing.  Heart rate controlled on metoprolol.  Having some fluctuations in blood pressure but stable overall.  He has been off hemodialysis for the past 2 months.  Seems to be doing well.  Making urine.  He has gained weight.  Does not appear to be volume overloaded.  We discussed going on Eliquis and pursuing a cardioversion.  No signs of bleeding.  Polyps were found on EGD and colonoscopy.  I have reached out to his GI doctor.  Problem List Cardiac Arrest -2/2 septic shock 02/22/2022-04/03/2022 -scrotal infection  -2/2 troponin elevation, normal echo 2. AKI on HD -TTS perm cath  3. LE gangrene -s/p L transmetatarsal amputation, R BKA -2/2 pressors from shock  4. Paroxysmal Afib -CHADSVASC=2 (CAD, HTN)  5. Anemia -AC on hold  6. Cirrhosis  Past Medical History: Past Medical History:  Diagnosis Date   A-fib ([place])    Acute nonintractable headache 03/25/2022   Anemia    Arthritis    Atrial fibrillation, chronic ([name])    not on [facility]   Cardiac arrest ([place]) 02/2022   in the setting of septic shock   Cirrhosis of liver ([name])    Concussion    Dysrhythmia    afib   Eosinophilic esophagitis    EGD/bx by Dr. 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    Past Surgical History: Past Surgical History:  Procedure Laterality Date   AMPUTATION Right 03/15/2022   Procedure: RIGHT BELOW KNEE AMPUTATION;  Surgeon: [name], MD;  Location: [place];  Service: Orthopedics;  Laterality: Right;   AV FISTULA PLACEMENT Left 08/28/2022   Procedure: LEFT ARM ARTERIOVENOUS (AV) FISTULA CREATION;  Surgeon: Waynetta Sandy, MD;  Location: [place];  Service: Vascular;  Laterality: Left;   CYSTOSCOPY     CYSTOSCOPY WITH STENT PLACEMENT Bilateral 08/15/2022   Procedure: CYSTOSCOPY WITH STENT PLACEMENT;  Surgeon: Remi Haggard, MD;  Location: [place];  Service: Urology;  Laterality: Bilateral;   CYSTOSCOPY/URETEROSCOPY/HOLMIUM LASER/STENT PLACEMENT Bilateral 09/14/2022   Procedure: CYSTOSCOPY/URETEROSCOPY/HOLMIUM LASER/STENT PLACEMENT;  Surgeon: Raynelle Bring, MD;  Location: [facility];  Service: Urology;  Laterality: Bilateral;   DENTAL SURGERY     FINGER SURGERY     HERNIA REPAIR     at age 55   IR FLUORO GUIDE CV LINE RIGHT  03/09/2022   IR PATIENT EVAL TECH 0-60 MINS  03/09/2022   IR US GUIDE VASC ACCESS RIGHT  03/09/2022  KNEE ARTHROSCOPY Left    LITHOTRIPSY     NEPHROLITHOTOMY     SCROTAL EXPLORATION N/A 03/16/2022   Procedure: IRRIGATION AND DEBRIDEMENT SCROTUM;  Surgeon: Raynelle Bring, MD;  Location: [place];  Service: Urology;  Laterality: N/A;   SCROTAL EXPLORATION N/A 03/28/2022   Procedure: IRRIGATION AND DEBRIDEMENT SCROTUM;  Surgeon: Ardis Hughs, MD;  Location: [place];  Service: Urology;  Laterality: N/A;   TONSILLECTOMY     TOTAL KNEE ARTHROPLASTY Bilateral 11/04/2018   Procedure: BILATERAL TOTAL KNEE ARTHROPLASTY;  Surgeon: [name], MD;  Location: [place];  Service: Orthopedics;  Laterality: Bilateral;  spinal/epidural per anesthesiologist   TRANSMETATARSAL  AMPUTATION Left 03/15/2022   Procedure: LEFT TRANSMETATARSAL AMPUTATION;  Surgeon: [name], MD;  Location: [place];  Service: Orthopedics;  Laterality: Left;    Current Medications: Current Meds  Medication Sig   acetaminophen (TYLENOL) 500 MG tablet Take 1,000 mg by mouth every 6 (six) hours as needed for moderate pain.   allopurinol (ZYLOPRIM) 100 MG tablet TAKE 1 TABLET BY MOUTH EVERY DAY   diphenhydramine-acetaminophen (TYLENOL PM) 25-500 MG TABS tablet Take 1 tablet by mouth at bedtime as needed (sleep).   furosemide (LASIX) 40 MG tablet Take 40 mg by mouth.   gabapentin (NEURONTIN) 100 MG capsule Take 100 mg by mouth 2 (two) times daily.   metoprolol tartrate (LOPRESSOR) 25 MG tablet Take 12.5 mg by mouth 2 (two) times daily.   oxyCODONE-acetaminophen (PERCOCET) 5-325 MG tablet Take 1 tablet by mouth every 6 (six) hours as needed for severe pain.   pantoprazole (PROTONIX) 40 MG tablet Take 1 tablet (40 mg total) by mouth 2 (two) times daily. (Patient taking differently: Take 40 mg by mouth daily.)   traZODone (DESYREL) 50 MG tablet TAKE 1/2 OR 1 TABLET BY MOUTH AT BEDTIME AS NEEDED FOR SLEEP (Patient taking differently: Take 50 mg by mouth at bedtime.)     Allergies:    Codeine and Flomax [tamsulosin hcl]   Social History: Social History   Socioeconomic History   Marital status: Married    Spouse name: Not on file   Number of children: 2   Years of education: Not on file   Highest education level: Not on file  Occupational History   Occupation: Press photographer - Clinical cytogeneticist  Tobacco Use   Smoking status: Never   Smokeless tobacco: Never  Vaping Use   Vaping Use: Never used  Substance and Sexual Activity   Alcohol use: Not Currently    Comment: occassional   Drug use: No   Sexual activity: Not Currently  Other Topics Concern   Not on file  Social History Narrative   Not on file   Social Determinants of Health   Financial Resource Strain: Not on file  Food  Insecurity: No Food Insecurity (08/19/2022)   Hunger Vital Sign    Worried About Running Out of Food in the Last Year: Never true    Ran Out of Food in the Last Year: Never true  Transportation Needs: No Transportation Needs (08/19/2022)   PRAPARE - Hydrologist (Medical): No    Lack of Transportation (Non-Medical): No  Physical Activity: Not on file  Stress: Not on file  Social Connections: Not on file     Family History: The patient's family history includes Arrhythmia in his mother; COPD in his mother; Cancer in his brother; Sarcoidosis in his brother. There is no history of Stomach cancer, Pancreatic cancer, Esophageal cancer,  Colon cancer, or Rectal cancer.  ROS:   All other ROS reviewed and negative. Pertinent positives noted in the HPI.     EKGs/Labs/Other Studies Reviewed:   The following studies were personally reviewed by me today:  TTE 02/23/2022  1. Left ventricular ejection fraction, by estimation, is 60 to 65%. The  left ventricle has normal function. The left ventricle has no regional  wall motion abnormalities. There is mild left ventricular hypertrophy.  Left ventricular diastolic function  could not be evaluated.   2. Right ventricular systolic function is normal. The right ventricular  size is normal.   3. The mitral valve is normal in structure. No evidence of mitral valve  regurgitation. No evidence of mitral stenosis.   4. The aortic valve is tricuspid. Aortic valve regurgitation is not  visualized. No aortic stenosis is present.   5. The inferior vena cava is dilated in size with <50% respiratory  variability, suggesting right atrial pressure of 15 mmHg.   Recent Labs: 02/22/2022: B Natriuretic Peptide 307.0; TSH 0.687 04/18/2022: Magnesium 1.9 08/15/2022: ALT 16 09/14/2022: BUN 20; Creatinine, Ser 3.08; Potassium 3.5; Sodium 139 10/01/2022: Platelets 172.0 10/19/2022: Hemoglobin 9.1   Recent Lipid Panel    Component Value  Date/Time   TRIG 159 (H) 03/17/2022 0754    Physical Exam:   VS:  BP (!) 148/88   Pulse 65   Ht '5\' 10"'$  (1.778 m)   Wt 230 lb (104.3 kg)   SpO2 99%   BMI 33.00 kg/m    Wt Readings from Last 3 Encounters:  10/28/22 230 lb (104.3 kg)  10/26/22 200 lb (90.7 kg)  10/14/22 200 lb (90.7 kg)    General: Well nourished, well developed, in no acute distress Head: Atraumatic, normal size  Eyes: PEERLA, EOMI  Neck: Supple, no JVD Endocrine: No thryomegaly Cardiac: Normal S1, S2; irregular rhythm Lungs: Clear to auscultation bilaterally, no wheezing, rhonchi or rales  Abd: Soft, nontender, no hepatomegaly  Ext: Status post right BKA Musculoskeletal: No deformities, BUE and BLE strength normal and equal Skin: Warm and dry, no rashes   Neuro: Alert and oriented to person, place, time, and situation, CNII-XII grossly intact, no focal deficits  Psych: Normal mood and affect   ASSESSMENT:   Jeffrey Ayala is a 61 y.o. male who presents for the following: 1. Persistent atrial fibrillation ([place])     PLAN:   1. Persistent atrial fibrillation ([name]) -Remains in rate controlled atrial fibrillation metoprolol tartrate 25 mg twice daily.  We have been unable to pursue cardioversion due to persistent anemia.  Now off hemodialysis.  EGD and colonoscopy with polyps.  They have been removed.  I will reach out to his GI doctor to determine if we can start Eliquis.  We will then pursue cardioversion on 11/20/2022.  He needs an attempt at restoration of sinus rhythm.  Echo was normal.  He has been diagnosed with cirrhosis but not a drinker.  Suspect this could be related to obesity.  No signs of varices on recent EGD and colonoscopy.  He will see me back in 2 to 3 weeks after his cardioversion.   Shared Decision Making/Informed Consent The risks (stroke, cardiac arrhythmias rarely resulting in the need for a temporary or permanent pacemaker, skin irritation or burns and complications associated with  conscious sedation including aspiration, arrhythmia, respiratory failure and death), benefits (restoration of normal sinus rhythm) and alternatives of a direct current cardioversion were explained in detail to Jeffrey Ayala and he agrees  to proceed.    Disposition: Return in about 6 weeks (around 12/09/2022).  Medication Adjustments/Labs and Tests Ordered: Current medicines are reviewed at length with the patient today.  Concerns regarding medicines are outlined above.  Orders Placed This Encounter  Procedures   CBC   Basic metabolic panel   No orders of the defined types were placed in this encounter.   Patient Instructions  Medication Instructions:  START Eliquis when he calls to instruct you to start.   *If you need a refill on your cardiac medications before your next appointment, please call your pharmacy*   Lab Work: CBC, BMET today   If you have labs (blood work) drawn today and your tests are completely normal, you will receive your results only by: [place] (if you have MyChart) OR A paper copy in the mail If you have any lab test that is abnormal or we need to change your treatment, we will call you to review the results.   Testing/Procedures: Your physician has requested that you have a Cardioversion.  Electrical Cardioversion uses a jolt of electricity to your heart either through paddles or wired patches attached to your chest. This is a controlled, usually prescheduled, procedure. This procedure is done at the hospital and you are not awake during the procedure. You usually go home the day of the procedure. Please see the instruction sheet given to you today for more information.    Follow-Up: At [facility], you and your health needs are our priority.  As part of our continuing mission to provide you with exceptional heart care, we have created designated Provider Care Teams.  These Care Teams include your primary Cardiologist (physician) and Advanced  Practice Providers (APPs -  Physician Assistants and Nurse Practitioners) who all work together to provide you with the care you need, when you need it.  We recommend signing up for the patient portal called "MyChart".  Sign up information is provided on this After Visit Summary.  MyChart is used to connect with patients for Virtual Visits (Telemedicine).  Patients are able to view lab/test results, encounter notes, upcoming appointments, etc.  Non-urgent messages can be sent to your provider as well.   To learn more about what you can do with MyChart, go to [email or web address].    Your next appointment:   December 04, 2022 at 9:00 AM  The format for your next appointment:   In Person  Provider:   Evalina Field, MD     Other Instructions    Dear Jeffrey Ayala  You are scheduled for a Cardioversion on Friday, December 29 with Dr. Johney Frame.  Please arrive at the [facility] (Main Entrance A) at [facility]: [address] [place], [place] [address] at 9:00 AM.   DIET:  Nothing to eat or drink after midnight except a sip of water with medications (see medication instructions below)  MEDICATION INSTRUCTIONS: Continue Eliquis when you start.   LABS: CBC, BMET today   FYI:  For your safety, and to allow [place] to monitor your vital signs accurately during the surgery/procedure we request: If you have artificial nails, gel coating, SNS etc, please have those removed prior to your surgery/procedure. Not having the nail coverings /polish removed may result in cancellation or delay of your surgery/procedure.  You must have a responsible person to drive you home and stay in the waiting area during your procedure. Failure to do so could result in cancellation.  Bring your insurance cards.  *  Special Note: Every effort is made to have your procedure done on time. Occasionally there are emergencies that occur at the hospital that may cause delays. Please be patient if a delay does  occur.              Time Spent with Patient: I have spent a total of 25 minutes with patient reviewing hospital notes, telemetry, EKGs, labs and examining the patient as well as establishing an assessment and plan that was discussed with the patient.  > 50% of time was spent in direct patient care.  Signed, Addison Naegeli. Audie Box, MD, [place]  [address], [place], [place] [address] [phone]  10/28/2022 9:22 AM

## 2022-10-22 NOTE — Telephone Encounter (Signed)
LM for terri to call back

## 2022-10-25 NOTE — Anesthesia Preprocedure Evaluation (Signed)
Anesthesia Evaluation  Patient identified by MRN, date of birth, ID band Patient awake    Reviewed: Allergy & Precautions, NPO status , Patient's Chart, lab work & pertinent test results, reviewed documented beta blocker date and time   History of Anesthesia Complications Negative for: history of anesthetic complications  Airway Mallampati: III  TM Distance: >3 FB Neck ROM: Full    Dental  (+) Missing, Chipped, Poor Dentition,    Pulmonary neg pulmonary ROS   Pulmonary exam normal        Cardiovascular hypertension, Pt. on medications and Pt. on home beta blockers + Past MI  Normal cardiovascular exam+ dysrhythmias Atrial Fibrillation   TTE 02/23/22: EF 60-65%, mild LVH, valves ok  Cardiac arrest 02/2022 in setting of septic shock     Neuro/Psych  Headaches   Depression       GI/Hepatic PUD,GERD  Medicated,,(+) Cirrhosis         Endo/Other  Hypothyroidism    Renal/GU ESRF and DialysisRenal disease     Musculoskeletal  (+) Arthritis ,    Abdominal   Peds  Hematology  (+) Blood dyscrasia, anemia   Anesthesia Other Findings Day of surgery medications reviewed with patient.  Reproductive/Obstetrics                             Anesthesia Physical Anesthesia Plan  ASA: 3  Anesthesia Plan: MAC   Post-op Pain Management: Minimal or no pain anticipated   Induction:   PONV Risk Score and Plan: Treatment may vary due to age or medical condition and Propofol infusion  Airway Management Planned: Natural Airway and Simple Face Mask  Additional Equipment: None  Intra-op Plan:   Post-operative Plan:   Informed Consent: I have reviewed the patients History and Physical, chart, labs and discussed the procedure including the risks, benefits and alternatives for the proposed anesthesia with the patient or authorized representative who has indicated his/her understanding and acceptance.        Plan Discussed with: CRNA  Anesthesia Plan Comments:        Anesthesia Quick Evaluation

## 2022-10-26 ENCOUNTER — Encounter (HOSPITAL_COMMUNITY): Payer: Self-pay | Admitting: Gastroenterology

## 2022-10-26 ENCOUNTER — Other Ambulatory Visit: Payer: Self-pay

## 2022-10-26 ENCOUNTER — Ambulatory Visit (HOSPITAL_COMMUNITY)
Admission: RE | Admit: 2022-10-26 | Discharge: 2022-10-26 | Disposition: A | Discharge status: Home / Self Care | Payer: BC Managed Care – PPO | Attending: Gastroenterology | Admitting: Gastroenterology

## 2022-10-26 ENCOUNTER — Encounter (HOSPITAL_COMMUNITY)
Admission: RE | Disposition: A | Discharge status: Home / Self Care | Payer: Self-pay | Source: Home / Self Care | Attending: Gastroenterology

## 2022-10-26 ENCOUNTER — Ambulatory Visit (HOSPITAL_COMMUNITY): Payer: BC Managed Care – PPO | Admitting: Anesthesiology

## 2022-10-26 DIAGNOSIS — I120 Hypertensive chronic kidney disease with stage 5 chronic kidney disease or end stage renal disease: Secondary | ICD-10-CM | POA: Diagnosis not present

## 2022-10-26 DIAGNOSIS — K317 Polyp of stomach and duodenum: Secondary | ICD-10-CM

## 2022-10-26 DIAGNOSIS — I252 Old myocardial infarction: Secondary | ICD-10-CM | POA: Diagnosis not present

## 2022-10-26 DIAGNOSIS — D120 Benign neoplasm of cecum: Secondary | ICD-10-CM | POA: Diagnosis not present

## 2022-10-26 DIAGNOSIS — K648 Other hemorrhoids: Secondary | ICD-10-CM | POA: Diagnosis not present

## 2022-10-26 DIAGNOSIS — D126 Benign neoplasm of colon, unspecified: Secondary | ICD-10-CM

## 2022-10-26 DIAGNOSIS — Z1211 Encounter for screening for malignant neoplasm of colon: Secondary | ICD-10-CM | POA: Diagnosis not present

## 2022-10-26 DIAGNOSIS — N186 End stage renal disease: Secondary | ICD-10-CM | POA: Diagnosis not present

## 2022-10-26 DIAGNOSIS — D123 Benign neoplasm of transverse colon: Secondary | ICD-10-CM | POA: Diagnosis not present

## 2022-10-26 DIAGNOSIS — D649 Anemia, unspecified: Secondary | ICD-10-CM | POA: Diagnosis present

## 2022-10-26 DIAGNOSIS — D631 Anemia in chronic kidney disease: Secondary | ICD-10-CM | POA: Insufficient documentation

## 2022-10-26 DIAGNOSIS — Z992 Dependence on renal dialysis: Secondary | ICD-10-CM | POA: Diagnosis not present

## 2022-10-26 DIAGNOSIS — Z79899 Other long term (current) drug therapy: Secondary | ICD-10-CM | POA: Diagnosis not present

## 2022-10-26 DIAGNOSIS — K219 Gastro-esophageal reflux disease without esophagitis: Secondary | ICD-10-CM | POA: Diagnosis not present

## 2022-10-26 DIAGNOSIS — Z8674 Personal history of sudden cardiac arrest: Secondary | ICD-10-CM | POA: Insufficient documentation

## 2022-10-26 DIAGNOSIS — F32A Depression, unspecified: Secondary | ICD-10-CM | POA: Diagnosis not present

## 2022-10-26 DIAGNOSIS — K5730 Diverticulosis of large intestine without perforation or abscess without bleeding: Secondary | ICD-10-CM | POA: Diagnosis not present

## 2022-10-26 DIAGNOSIS — Z8711 Personal history of peptic ulcer disease: Secondary | ICD-10-CM | POA: Diagnosis not present

## 2022-10-26 DIAGNOSIS — K7460 Unspecified cirrhosis of liver: Secondary | ICD-10-CM | POA: Insufficient documentation

## 2022-10-26 DIAGNOSIS — I482 Chronic atrial fibrillation, unspecified: Secondary | ICD-10-CM | POA: Insufficient documentation

## 2022-10-26 DIAGNOSIS — E039 Hypothyroidism, unspecified: Secondary | ICD-10-CM | POA: Diagnosis not present

## 2022-10-26 DIAGNOSIS — D125 Benign neoplasm of sigmoid colon: Secondary | ICD-10-CM | POA: Insufficient documentation

## 2022-10-26 DIAGNOSIS — K319 Disease of stomach and duodenum, unspecified: Secondary | ICD-10-CM | POA: Diagnosis not present

## 2022-10-26 DIAGNOSIS — I10 Essential (primary) hypertension: Secondary | ICD-10-CM | POA: Diagnosis not present

## 2022-10-26 HISTORY — PX: ESOPHAGOGASTRODUODENOSCOPY (EGD) WITH PROPOFOL: SHX5813

## 2022-10-26 HISTORY — PX: POLYPECTOMY: SHX5525

## 2022-10-26 HISTORY — PX: BIOPSY: SHX5522

## 2022-10-26 HISTORY — PX: COLONOSCOPY WITH PROPOFOL: SHX5780

## 2022-10-26 HISTORY — DX: Cardiac arrhythmia, unspecified: I49.9

## 2022-10-26 SURGERY — COLONOSCOPY WITH PROPOFOL
Anesthesia: Monitor Anesthesia Care

## 2022-10-26 MED ORDER — PROPOFOL 1000 MG/100ML IV EMUL
INTRAVENOUS | Status: AC
  Filled 2022-10-26: qty 100

## 2022-10-26 MED ORDER — PROPOFOL 10 MG/ML IV BOLUS
INTRAVENOUS | Status: DC | PRN
  Administered 2022-10-26: 20 mg via INTRAVENOUS
  Administered 2022-10-26: 10 mg via INTRAVENOUS
  Administered 2022-10-26: 20 mg via INTRAVENOUS
  Administered 2022-10-26: 10 mg via INTRAVENOUS
  Administered 2022-10-26: 20 mg via INTRAVENOUS
  Administered 2022-10-26 (×2): 10 mg via INTRAVENOUS

## 2022-10-26 MED ORDER — LIDOCAINE HCL 1 % IJ SOLN
INTRAMUSCULAR | Status: DC | PRN
  Administered 2022-10-26: 50 mg via INTRADERMAL

## 2022-10-26 MED ORDER — PROPOFOL 500 MG/50ML IV EMUL
INTRAVENOUS | Status: DC | PRN
  Administered 2022-10-26: 110 ug/kg/min via INTRAVENOUS

## 2022-10-26 MED ORDER — SODIUM CHLORIDE 0.9 % IV SOLN
INTRAVENOUS | Status: DC
  Administered 2022-10-26: 1000 mL via INTRAVENOUS

## 2022-10-26 MED ORDER — EPHEDRINE SULFATE (PRESSORS) 50 MG/ML IJ SOLN
INTRAMUSCULAR | Status: DC | PRN
  Administered 2022-10-26: 5 mg via INTRAVENOUS

## 2022-10-26 MED ORDER — PHENYLEPHRINE HCL (PRESSORS) 10 MG/ML IV SOLN
INTRAVENOUS | Status: DC | PRN
  Administered 2022-10-26: 180 ug via INTRAVENOUS
  Administered 2022-10-26: 100 ug via INTRAVENOUS
  Administered 2022-10-26: 80 ug via INTRAVENOUS
  Administered 2022-10-26 (×2): 180 ug via INTRAVENOUS
  Administered 2022-10-26: 80 ug via INTRAVENOUS
  Administered 2022-10-26: 180 ug via INTRAVENOUS

## 2022-10-26 MED ORDER — PHENYLEPHRINE HCL-NACL 20-0.9 MG/250ML-% IV SOLN
INTRAVENOUS | Status: DC | PRN
  Administered 2022-10-26: 60 ug/min via INTRAVENOUS

## 2022-10-26 SURGICAL SUPPLY — 25 items

## 2022-10-26 NOTE — Op Note (Signed)
[facility] Patient Name: Jeffrey Ayala Procedure Date: 10/26/2022 MRN: [id number] Attending MD: Willaim Rayas. Adela Lank , MD, [id number] Date of Birth: 1961-04-08 CSN: [id number] Age: 61 Admit Type: Outpatient Procedure:                Colonoscopy Indications:              Screening for colorectal malignant neoplasm Providers:                Viviann Spare P. Adela Lank, MD, Marge Duncans, RN,                            Priscella Mann, Technician Referring MD:              Medicines:                Monitored Anesthesia Care Complications:            No immediate complications. Estimated blood loss:                            Minimal. Estimated Blood Loss:     Estimated blood loss was minimal. Procedure:                Pre-Anesthesia Assessment:                           - Prior to the procedure, a History and Physical                            was performed, and patient medications and                            allergies were reviewed. The patient's tolerance of                            previous anesthesia was also reviewed. The risks                            and benefits of the procedure and the sedation                            options and risks were discussed with the patient.                            All questions were answered, and informed consent                            was obtained. Prior Anticoagulants: The patient has                            taken no anticoagulant or antiplatelet agents. ASA                            Grade Assessment: III - A patient with severe  systemic disease. After reviewing the risks and                            benefits, the patient was deemed in satisfactory                            condition to undergo the procedure.                           After obtaining informed consent, the colonoscope                            was passed under direct vision. Throughout the                             procedure, the patient's blood pressure, pulse, and                            oxygen saturations were monitored continuously. The                            [id number]-HQ190L ([id number]) Olympus colonoscope was                            introduced through the anus and advanced to the the                            cecum, identified by appendiceal orifice and                            ileocecal valve. The colonoscopy was performed                            without difficulty. The patient tolerated the                            procedure well. The quality of the bowel                            preparation was good. The ileocecal valve,                            appendiceal orifice, and rectum were photographed. Scope In: 7:48:05 AM Scope Out: 8:08:18 AM Scope Withdrawal Time: 0 hours 17 minutes 49 seconds  Total Procedure Duration: 0 hours 20 minutes 13 seconds  Findings:      The perianal and digital rectal examinations were normal.      A 3 to 4 mm polyp was found in the cecum. The polyp was sessile. The       polyp was removed with a cold snare. Resection and retrieval were       complete.      A few small-mouthed diverticula were found in the left colon and right       colon.      A 3 mm polyp was found in the transverse colon.  The polyp was sessile.       The polyp was removed with a cold snare. Resection and retrieval were       complete.      Two sessile polyps were found in the sigmoid colon. The polyps were 3 mm       in size. These polyps were removed with a cold snare. Resection and       retrieval were complete.      Internal hemorrhoids were found during retroflexion. The hemorrhoids       were small.      The exam was otherwise without abnormality. Impression:               - One 3 to 4 mm polyp in the cecum, removed with a                            cold snare. Resected and retrieved.                           - Diverticulosis in the left colon and in the right                             colon.                           - One 3 mm polyp in the transverse colon, removed                            with a cold snare. Resected and retrieved.                           - Two 3 mm polyps in the sigmoid colon, removed                            with a cold snare. Resected and retrieved.                           - Internal hemorrhoids.                           - The examination was otherwise normal. Moderate Sedation:      No moderate sedation, case performed with MAC Recommendation:           - Patient has a contact number available for                            emergencies. The signs and symptoms of potential                            delayed complications were discussed with the                            patient. Return to normal activities tomorrow.                            Written discharge instructions were provided to the  patient.                           - Resume previous diet.                           - Continue present medications.                           - Await pathology results. Procedure Code(s):        --- Professional ---                           [phone], Colonoscopy, flexible; with removal of                            tumor(s), polyp(s), or other lesion(s) by snare                            technique Diagnosis Code(s):        --- Professional ---                           Z12.11, Encounter for screening for malignant                            neoplasm of colon                           D12.0, Benign neoplasm of cecum                           D12.3, Benign neoplasm of transverse colon (hepatic                            flexure or splenic flexure)                           K64.8, Other hemorrhoids                           D12.5, Benign neoplasm of sigmoid colon                           K57.30, Diverticulosis of large intestine without                            perforation or abscess without bleeding CPT  copyright 2022 American Medical Association. All rights reserved. The codes documented in this report are preliminary and upon coder review may  be revised to meet current compliance requirements. Viviann Spare P. Renata Gambino, MD 10/26/2022 8:13:47 AM This report has been signed electronically. Number of Addenda: 0

## 2022-10-26 NOTE — Op Note (Signed)
[facility] Patient Name: Jeffrey Ayala Procedure Date: 10/26/2022 MRN: [id number] Attending MD: Willaim Rayas. Adela Lank , MD, [id number] Date of Birth: [birth year] CSN: [id number] Age: 61 Admit Type: Outpatient Procedure:                Upper GI endoscopy Indications:              Cirrhosis rule out esophageal varices. Also with                            history of EoE and gastric ulcer - on protonix, no                            reflux / dysphagia symptoms Providers:                Willaim Rayas. Adela Lank, MD, Marge Duncans, RN,                            Priscella Mann, Technician Referring MD:              Medicines:                Monitored Anesthesia Care Complications:            No immediate complications. Estimated blood loss:                            Minimal. Estimated Blood Loss:     Estimated blood loss was minimal. Procedure:                Pre-Anesthesia Assessment:                           - Prior to the procedure, a History and Physical                            was performed, and patient medications and                            allergies were reviewed. The patient's tolerance of                            previous anesthesia was also reviewed. The risks                            and benefits of the procedure and the sedation                            options and risks were discussed with the patient.                            All questions were answered, and informed consent                            was obtained. Prior Anticoagulants: The patient has  taken no anticoagulant or antiplatelet agents. ASA                            Grade Assessment: III - A patient with severe                            systemic disease. After reviewing the risks and                            benefits, the patient was deemed in satisfactory                            condition to undergo the procedure.                           After  obtaining informed consent, the endoscope was                            passed under direct vision. Throughout the                            procedure, the patient's blood pressure, pulse, and                            oxygen saturations were monitored continuously. The                            [id number]-[id number] ([id number]) Olympus endoscope was introduced                            through the mouth, and advanced to the second part                            of duodenum. The upper GI endoscopy was                            accomplished without difficulty. The patient                            tolerated the procedure well. Scope In: Scope Out: Findings:      Esophagogastric landmarks were identified: the Z-line was found at 44       cm, the gastroesophageal junction was found at 44 cm and the upper       extent of the gastric folds was found at 44 cm from the incisors.      The exam of the esophagus was otherwise normal. No varices or active       inflammation noted.      Multiple small sessile polyps were found in the gastric antrum and in       the prepyloric region of the stomach. Biopsies were taken with a cold       forceps for histology.      The exam of the stomach was otherwise normal. No varices. Interval       healing of gastric ulcer.      The  examined duodenum was normal. Impression:               - Esophagogastric landmarks identified.                           - Normal esophagus otherwise - no varices.                           - Multiple gastric polyps. Biopsied.                           - Normal stomach otherwise - no varices.                           - Normal examined duodenum. Moderate Sedation:      No moderate sedation, case performed with MAC Recommendation:           - Patient has a contact number available for                            emergencies. The signs and symptoms of potential                            delayed complications were discussed with the                             patient. Return to normal activities tomorrow.                            Written discharge instructions were provided to the                            patient.                           - Resume previous diet.                           - Continue present medications.                           - Await pathology results. Procedure Code(s):        --- Professional ---                           [id number], Esophagogastroduodenoscopy, flexible,                            transoral; with biopsy, single or multiple Diagnosis Code(s):        --- Professional ---                           K31.7, Polyp of stomach and duodenum                           K74.60, Unspecified cirrhosis of liver CPT copyright 2022 American Medical Association. All rights reserved. The codes documented in this report  are preliminary and upon coder review may  be revised to meet current compliance requirements. Viviann Spare P. Azari Janssens, MD 10/26/2022 8:18:02 AM This report has been signed electronically. Number of Addenda: 0

## 2022-10-26 NOTE — H&P (Signed)
[facility] History and Physical   Primary Care Physician:  Jeffrey Rm, NP-C   Reason for Procedure:   Cirrhosis - screening for varices, colon cancer screening  Plan:    EGD and colonoscopy     HPI: Jeffrey Ayala is a 61 y.o. male  here for EGD and colonoscopy to evaluate issues as outlined. . Patient denies any bowel symptoms at this time. No family history of colon cancer known. Otherwise feels well without any cardiopulmonary symptoms today. He previously was on HD when this procedure was scheduled but has been off for the past 4-6 weeks or so. He has chronic anemia thought to be due to renal disease. History of EoE and gastric ulcers, on protonix, no GERD or dysphagia currently.   I have discussed risks / benefits of anesthesia and endoscopic procedure with Jeffrey Ayala and they wish to proceed with the exams as outlined today.    Past Medical History:  Diagnosis Date   A-fib [facility])    Acute nonintractable headache 03/25/2022   Anemia    Arthritis    Atrial fibrillation, chronic ([name])    not on [facility]   Cardiac arrest ([place]) 02/2022   in the setting of septic shock   Cirrhosis of liver ([name])    Concussion    Dysrhythmia    afib   Eosinophilic esophagitis    EGD/bx by Dr. 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    Past Surgical History:  Procedure Laterality Date   AMPUTATION Right 03/15/2022   Procedure: RIGHT BELOW KNEE AMPUTATION;  Surgeon: [name], MD;  Location: [place];  Service: Orthopedics;  Laterality: Right;   AV FISTULA PLACEMENT Left 08/28/2022   Procedure: LEFT ARM ARTERIOVENOUS (AV) FISTULA CREATION;  Surgeon: Waynetta Sandy, MD;  Location: [place];  Service: Vascular;  Laterality: Left;   CYSTOSCOPY      CYSTOSCOPY WITH STENT PLACEMENT Bilateral 08/15/2022   Procedure: CYSTOSCOPY WITH STENT PLACEMENT;  Surgeon: Remi Haggard, MD;  Location: [place];  Service: Urology;  Laterality: Bilateral;   CYSTOSCOPY/URETEROSCOPY/HOLMIUM LASER/STENT PLACEMENT Bilateral 09/14/2022   Procedure: CYSTOSCOPY/URETEROSCOPY/HOLMIUM LASER/STENT PLACEMENT;  Surgeon: Raynelle Bring, MD;  Location: [facility];  Service: Urology;  Laterality: Bilateral;   DENTAL SURGERY     FINGER SURGERY     HERNIA REPAIR     at age 89   IR FLUORO GUIDE CV LINE RIGHT  03/09/2022   IR PATIENT EVAL TECH 0-60 MINS  03/09/2022   IR US GUIDE VASC ACCESS RIGHT  03/09/2022   KNEE ARTHROSCOPY Left    LITHOTRIPSY     NEPHROLITHOTOMY     SCROTAL EXPLORATION N/A 03/16/2022   Procedure: IRRIGATION AND DEBRIDEMENT SCROTUM;  Surgeon: Raynelle Bring, MD;  Location: [place];  Service: Urology;  Laterality: N/A;   SCROTAL EXPLORATION N/A 03/28/2022   Procedure: IRRIGATION AND DEBRIDEMENT SCROTUM;  Surgeon: Ardis Hughs, MD;  Location: [place];  Service: Urology;  Laterality: N/A;   TONSILLECTOMY     TOTAL KNEE ARTHROPLASTY Bilateral 11/04/2018   Procedure: BILATERAL TOTAL KNEE ARTHROPLASTY;  Surgeon: [name], MD;  Location: [place] [facility];  Service: Orthopedics;  Laterality: Bilateral;  spinal/epidural per anesthesiologist   TRANSMETATARSAL AMPUTATION  Left 03/15/2022   Procedure: LEFT TRANSMETATARSAL AMPUTATION;  Surgeon: [name], MD;  Location: [place];  Service: Orthopedics;  Laterality: Left;    Prior to Admission medications   Medication Sig Start Date End Date Taking? Authorizing Provider  acetaminophen (TYLENOL) 500 MG tablet Take 1,000 mg by mouth every 6 (six) hours as needed for moderate pain.   Yes [provider]  allopurinol (ZYLOPRIM) 100 MG tablet TAKE 1 TABLET BY MOUTH EVERY DAY 09/21/22  Yes Henson, Vickie L, NP-C  diphenhydramine-acetaminophen (TYLENOL PM) 25-500 MG TABS tablet Take 1 tablet by mouth at bedtime as needed  (sleep).   Yes [provider]  furosemide (LASIX) 40 MG tablet Take 40 mg by mouth.   Yes [provider]  gabapentin (NEURONTIN) 100 MG capsule Take 100 mg by mouth 2 (two) times daily.   Yes [provider]  metoprolol tartrate (LOPRESSOR) 25 MG tablet Take 12.5 mg by mouth 2 (two) times daily.   Yes [provider]  oxyCODONE-acetaminophen (PERCOCET) 5-325 MG tablet Take 1 tablet by mouth every 6 (six) hours as needed for severe pain. 08/28/22  Yes Rhyne, Samantha J, PA-C  pantoprazole (PROTONIX) 40 MG tablet Take 1 tablet (40 mg total) by mouth 2 (two) times daily. Patient taking differently: Take 40 mg by mouth daily. 05/27/22  Yes Zehr, Laban Emperor, PA-C  traZODone (DESYREL) 50 MG tablet TAKE 1/2 OR 1 TABLET BY MOUTH AT BEDTIME AS NEEDED FOR SLEEP Patient taking differently: Take 50 mg by mouth at bedtime. 07/10/22  Yes Meredith Staggers, MD    Current Facility-Administered Medications  Medication Dose Route Frequency Provider Last Rate Last Admin   0.9 %  sodium chloride infusion   Intravenous Continuous Lynell Greenhouse, Carlota Raspberry, MD 20 mL/hr at 10/26/22 0712 1,000 mL at 10/26/22 6761    Allergies as of 08/12/2022 - Review Complete 08/12/2022  Allergen Reaction Noted   Codeine Hives 02/24/2014   Flomax [tamsulosin hcl] Nausea Only 08/30/2014    Family History  Problem Relation Age of Onset   Arrhythmia Mother    COPD Mother    Sarcoidosis Brother    Cancer Brother    Stomach cancer Neg Hx    Pancreatic cancer Neg Hx    Esophageal cancer Neg Hx    Colon cancer Neg Hx    Rectal cancer Neg Hx     Social History   Socioeconomic History   Marital status: Married    Spouse name: Not on file   Number of children: 2   Years of education: Not on file   Highest education level: Not on file  Occupational History   Occupation: Press photographer - Clinical cytogeneticist  Tobacco Use   Smoking status: Never   Smokeless tobacco: Never  Vaping Use   Vaping Use:  Never used  Substance and Sexual Activity   Alcohol use: Not Currently    Comment: occassional   Drug use: No   Sexual activity: Not Currently  Other Topics Concern   Not on file  Social History Narrative   Not on file   Social Determinants of Health   Financial Resource Strain: Not on file  Food Insecurity: No Food Insecurity (08/19/2022)   Hunger Vital Sign    Worried About Running Out of Food in the Last Year: Never true    Ran Out of Food in the Last Year: Never true  Transportation Needs: No Transportation Needs (08/19/2022)   PRAPARE - Transportation    Lack of Transportation (  Medical): No    Lack of Transportation (Non-Medical): No  Physical Activity: Not on file  Stress: Not on file  Social Connections: Not on file  Intimate Partner Violence: Not on file    Review of Systems: All other review of systems negative except as mentioned in the HPI.  Physical Exam: Vital signs BP 129/80   Pulse 86   Temp 97.7 F (36.5 C) (Temporal)   Resp 14   Ht '5\' 10"'$  (1.778 m)   Wt 90.7 kg   SpO2 100%   BMI 28.70 kg/m   General:   Alert,  Well-developed, pleasant and cooperative in NAD Lungs:  Clear throughout to auscultation.   Heart:  Regular rate and rhythm Abdomen:  Soft, nontender and nondistended.   Neuro/Psych:  Alert and cooperative. Normal mood and affect. A and O x 3  Jolly Mango, MD [facility]

## 2022-10-26 NOTE — Discharge Instructions (Signed)
YOU HAD AN ENDOSCOPIC PROCEDURE TODAY: Refer to the procedure report and other information in the discharge instructions given to you for any specific questions about what was found during the examination. If this information does not answer your questions, please call [place] office at [phone] to clarify.  ° °YOU SHOULD EXPECT: Some feelings of bloating in the abdomen. Passage of more gas than usual. Walking can help get rid of the air that was put into your GI tract during the procedure and reduce the bloating. If you had a lower endoscopy (such as a colonoscopy or flexible sigmoidoscopy) you may notice spotting of blood in your stool or on the toilet paper. Some abdominal soreness may be present for a day or two, also. ° °DIET: Your first meal following the procedure should be a light meal and then it is ok to progress to your normal diet. A half-sandwich or bowl of soup is an example of a good first meal. Heavy or fried foods are harder to digest and may make you feel nauseous or bloated. Drink plenty of fluids but you should avoid alcoholic beverages for 24 hours.  ° °ACTIVITY: Your care partner should take you home directly after the procedure. You should plan to take it easy, moving slowly for the rest of the day. You can resume normal activity the day after the procedure however YOU SHOULD NOT DRIVE, use power tools, machinery or perform tasks that involve climbing or major physical exertion for 24 hours (because of the sedation medicines used during the test).  ° °SYMPTOMS TO REPORT IMMEDIATELY: °A gastroenterologist can be reached at any hour. Please call [phone]  for any of the following symptoms:  °Following lower endoscopy (colonoscopy, flexible sigmoidoscopy) °Excessive amounts of blood in the stool  °Significant tenderness, worsening of abdominal pains  °Swelling of the abdomen that is new, acute  °Fever of 100° or higher  °Following upper endoscopy (EGD, EUS, ERCP, esophageal  dilation) °Vomiting of blood or coffee ground material  °New, significant abdominal pain  °New, significant chest pain or pain under the shoulder blades  °Painful or persistently difficult swallowing  °New shortness of breath  °Black, tarry-looking or red, bloody stools ° °FOLLOW UP:  °If any biopsies were taken you will be contacted by phone or by letter within the next 1-3 weeks. Call [phone]  if you have not heard about the biopsies in 3 weeks.  °Please also call with any specific questions about appointments or follow up tests.  °

## 2022-10-26 NOTE — Anesthesia Postprocedure Evaluation (Signed)
Anesthesia Post Note  Patient: Jeffrey Ayala  Procedure(s) Performed: COLONOSCOPY WITH PROPOFOL ESOPHAGOGASTRODUODENOSCOPY (EGD) WITH PROPOFOL BIOPSY POLYPECTOMY     Patient location during evaluation: PACU Anesthesia Type: MAC Level of consciousness: awake and alert Pain management: pain level controlled Vital Signs Assessment: post-procedure vital signs reviewed and stable Respiratory status: spontaneous breathing, nonlabored ventilation and respiratory function stable Cardiovascular status: blood pressure returned to baseline Postop Assessment: no apparent nausea or vomiting Anesthetic complications: no   No notable events documented.  Last Vitals:  Vitals:   10/26/22 0900 10/26/22 0911  BP: (!) 114/59 128/72  Pulse: 87 91  Resp: (!) 21 19  Temp:    SpO2: 98% 100%    Last Pain:  Vitals:   10/26/22 0911  TempSrc:   PainSc: 0-No pain                 Marthenia Rolling

## 2022-10-26 NOTE — Transfer of Care (Signed)
Immediate Anesthesia Transfer of Care Note  Patient: Jeffrey Ayala  Procedure(s) Performed: COLONOSCOPY WITH PROPOFOL ESOPHAGOGASTRODUODENOSCOPY (EGD) WITH PROPOFOL BIOPSY POLYPECTOMY  Patient Location: Endoscopy Unit  Anesthesia Type:MAC  Level of Consciousness: awake, alert , oriented, and patient cooperative  Airway & Oxygen Therapy: Patient Spontanous Breathing and Patient connected to face mask oxygen  Post-op Assessment: Report given to RN and Post -op Vital signs reviewed and stable  Post vital signs: Reviewed  Last Vitals:  Vitals Value Taken Time  BP 90/36 10/26/22 0822  Temp    Pulse 88 10/26/22 0824  Resp 16 10/26/22 0824  SpO2 100 % 10/26/22 0824  Vitals shown include unvalidated device data.  Last Pain:  Vitals:   10/26/22 0653  TempSrc: Temporal  PainSc: 0-No pain         Complications: No notable events documented.

## 2022-10-27 DIAGNOSIS — N201 Calculus of ureter: Secondary | ICD-10-CM | POA: Diagnosis not present

## 2022-10-27 DIAGNOSIS — R8271 Bacteriuria: Secondary | ICD-10-CM | POA: Diagnosis not present

## 2022-10-27 LAB — SURGICAL PATHOLOGY

## 2022-10-28 ENCOUNTER — Ambulatory Visit: Payer: BC Managed Care – PPO | Attending: Cardiovascular Disease | Admitting: Cardiovascular Disease

## 2022-10-28 ENCOUNTER — Encounter: Payer: Self-pay | Admitting: Physical Therapy

## 2022-10-28 ENCOUNTER — Ambulatory Visit (INDEPENDENT_AMBULATORY_CARE_PROVIDER_SITE_OTHER): Payer: BC Managed Care – PPO | Admitting: Physical Therapy

## 2022-10-28 ENCOUNTER — Encounter: Payer: Self-pay | Admitting: Cardiovascular Disease

## 2022-10-28 VITALS — BP 148/88 | HR 65 | Ht 70.0 in | Wt 230.0 lb

## 2022-10-28 DIAGNOSIS — I4819 Other persistent atrial fibrillation: Secondary | ICD-10-CM | POA: Diagnosis not present

## 2022-10-28 DIAGNOSIS — R2681 Unsteadiness on feet: Secondary | ICD-10-CM

## 2022-10-28 DIAGNOSIS — M79661 Pain in right lower leg: Secondary | ICD-10-CM

## 2022-10-28 DIAGNOSIS — R2689 Other abnormalities of gait and mobility: Secondary | ICD-10-CM | POA: Diagnosis not present

## 2022-10-28 DIAGNOSIS — L98498 Non-pressure chronic ulcer of skin of other sites with other specified severity: Secondary | ICD-10-CM

## 2022-10-28 DIAGNOSIS — M21372 Foot drop, left foot: Secondary | ICD-10-CM

## 2022-10-28 DIAGNOSIS — M6281 Muscle weakness (generalized): Secondary | ICD-10-CM

## 2022-10-28 MED ORDER — APIXABAN 5 MG PO TABS: 5.0000 mg | ORAL_TABLET | Freq: Two times a day (BID) | ORAL | 3 refills | Status: DC

## 2022-10-28 NOTE — Therapy (Signed)
OUTPATIENT PHYSICAL THERAPY TREATMENT NOTE  Patient Name: Jeffrey Ayala MRN: [id number] DOB:1960-12-12, 61 y.o., male Today's Date: 10/28/2022  PCP: [name], NP-C REFERRING PROVIDER: [name], MD   END OF SESSION:   PT End of Session - 10/28/22 0930     Visit Number 18    Number of Visits 20    Date for PT Re-Evaluation 11/20/22    Authorization Type [employer] comm PPO    Authorization Time Period 20 visit limit PT/OT/chiro, $35 co-pay    Authorization - Number of Visits 20    Progress Note Due on Visit 23    PT Start Time 0928    PT Stop Time 1012    PT Time Calculation (min) 44 min    Equipment Utilized During Treatment Gait belt    Activity Tolerance Patient tolerated treatment well;Patient limited by fatigue    Behavior During Therapy WFL for tasks assessed/performed;Flat affect                    Past Medical History:  Diagnosis Date   A-fib ([place])    Acute nonintractable headache 03/25/2022   Anemia    Arthritis    Atrial fibrillation, chronic ([name])    not on [facility]   Cardiac arrest ([place]) 02/2022   in the setting of septic shock   Cirrhosis of liver ([place])    Concussion    Dysrhythmia    afib   Eosinophilic esophagitis    EGD/bx by Dr. Havery Moros   ESRD needing dialysis [facility])    GERD (gastroesophageal reflux disease)    History of gastric ulcer    History of kidney stones    Intermittent dysphagia    Scrotal infection 02/2022   Septic shock ([place]) 02/2022   Vasoplegia - > L TMA and R BKA   Past Surgical History:  Procedure Laterality Date   AMPUTATION Right 03/15/2022   Procedure: RIGHT BELOW KNEE AMPUTATION;  Surgeon: [name], MD;  Location: [place];  Service: Orthopedics;  Laterality: Right;   AV FISTULA PLACEMENT Left 08/28/2022   Procedure: LEFT ARM ARTERIOVENOUS (AV) FISTULA CREATION;  Surgeon: Waynetta Sandy, MD;  Location: [place];  Service: Vascular;  Laterality: Left;   CYSTOSCOPY     CYSTOSCOPY WITH STENT  PLACEMENT Bilateral 08/15/2022   Procedure: CYSTOSCOPY WITH STENT PLACEMENT;  Surgeon: Remi Haggard, MD;  Location: [place];  Service: Urology;  Laterality: Bilateral;   CYSTOSCOPY/URETEROSCOPY/HOLMIUM LASER/STENT PLACEMENT Bilateral 09/14/2022   Procedure: CYSTOSCOPY/URETEROSCOPY/HOLMIUM LASER/STENT PLACEMENT;  Surgeon: Raynelle Bring, MD;  Location: [facility];  Service: Urology;  Laterality: Bilateral;   DENTAL SURGERY     FINGER SURGERY     HERNIA REPAIR     at age 70   IR FLUORO GUIDE CV LINE RIGHT  03/09/2022   IR PATIENT EVAL TECH 0-60 MINS  03/09/2022   IR US GUIDE VASC ACCESS RIGHT  03/09/2022   KNEE ARTHROSCOPY Left    LITHOTRIPSY     NEPHROLITHOTOMY     SCROTAL EXPLORATION N/A 03/16/2022   Procedure: IRRIGATION AND DEBRIDEMENT SCROTUM;  Surgeon: Raynelle Bring, MD;  Location: [place];  Service: Urology;  Laterality: N/A;   SCROTAL EXPLORATION N/A 03/28/2022   Procedure: IRRIGATION AND DEBRIDEMENT SCROTUM;  Surgeon: Ardis Hughs, MD;  Location: [place];  Service: Urology;  Laterality: N/A;   TONSILLECTOMY     TOTAL KNEE ARTHROPLASTY Bilateral 11/04/2018   Procedure: BILATERAL TOTAL KNEE ARTHROPLASTY;  Surgeon: [name], MD;  Location: [place];  Service: Orthopedics;  Laterality: Bilateral;  spinal/epidural per anesthesiologist   TRANSMETATARSAL AMPUTATION Left 03/15/2022   Procedure: LEFT TRANSMETATARSAL AMPUTATION;  Surgeon: [name], MD;  Location: [place];  Service: Orthopedics;  Laterality: Left;   Patient Active Problem List   Diagnosis Date Noted   Benign neoplasm of colon 10/26/2022   Gastric polyp 10/26/2022   Suspected sleep apnea 10/01/2022   Daytime somnolence 10/01/2022   Snoring 10/01/2022   Acute bilateral obstructive uropathy 08/15/2022   Skin breakdown 07/29/2022   Suicidal ideations 06/09/2022   Chills (without fever) 06/09/2022   Severe depression ([place]) 06/09/2022   Acute cough 06/09/2022   End stage renal disease ([place]) 06/02/2022   Reactive  depression 05/31/2022   Belching 05/27/2022   Gastroesophageal reflux disease 05/27/2022   Atrial fibrillation ([place]) 05/20/2022   Long term (current) use of anticoagulants 05/20/2022   S/P transmetatarsal amputation of foot, left ([place]) 04/18/2022   CKD (chronic kidney disease) stage 5, GFR less than 15 ml/min ([place]) 04/18/2022   Secondary hyperparathyroidism of renal origin ([place]) 04/10/2022   Pruritus, unspecified 04/10/2022   Nausea 04/10/2022   Iron deficiency anemia, unspecified 04/10/2022   Hypothyroidism, unspecified 04/10/2022   Other acute kidney failure ([place]) 04/10/2022   Dyspnea, unspecified 43/15/4008   Eosinophilic esophagitis 67/61/9509   Gastric ulcer, unspecified as acute or chronic, without hemorrhage or perforation 04/10/2022   Coagulation defect, unspecified ([place]) 04/10/2022   Allergy, unspecified, initial encounter 04/10/2022   Anaphylactic shock, unspecified, initial encounter 04/10/2022   Arthralgia of right lower leg    Acute blood loss anemia    Unilateral complete BKA, right, initial encounter ([place]) 04/03/2022   S/P BKA (below knee amputation), right ([place]) 04/03/2022   Old myocardial infarction 04/02/2022   Dependence on renal dialysis ([place]) 04/02/2022   Acquired absence of right leg below knee ([place]) 04/02/2022   Cirrhosis ([place]) 03/31/2022   Fournier's gangrene in male    Scrotal infection 03/25/2022   Anemia 03/25/2022   Diarrhea 03/25/2022   Shock liver 03/25/2022   Acute respiratory failure with hypoxia ([place]) 03/25/2022   Gout 03/25/2022   Acute nonintractable headache 03/25/2022   Demand ischemia 03/25/2022   Thrombocytopenia ([place]) 03/25/2022   Obesity, Class III, BMI 40-49.9 (morbid obesity) ([place]) 03/25/2022   Cardiac arrest ([place]) 03/25/2022   Hypotension    Respiratory insufficiency    Cellulitis    Malnutrition of moderate degree 03/12/2022   Altered mental status    Gangrene of right foot ([place])    Gangrene of left foot ([place])    Severe  protein-calorie malnutrition ([place]) 03/10/2022   Colon cancer screening    Pressure injury of skin 02/23/2022   Septic shock ([place]) 02/22/2022   AKI (acute kidney injury) ([place])    Generalized abdominal pain    Lactic acidosis    Atrial fibrillation with RVR ([name])    Hypokalemia    Hypomagnesemia    Pain in finger of right hand 08/14/2021   Degenerative joint disease of shoulder region 05/07/2020   Olecranon bursitis of right elbow 05/07/2020   Pain in joint of right shoulder 04/26/2020   S/P TKR (total knee replacement), bilateral 11/04/2018   Bilateral primary osteoarthritis of knee    DDD (degenerative disc disease), cervical 01/10/2018   Rupture of medial head of gastrocnemius, left, sequela 01/14/2017   Kidney stones 06/22/2016   Essential hypertension 08/01/2010   DYSTHYMIC DISORDER 07/08/2010   PALPITATIONS 07/08/2010    REFERRING DIAG: Z89.511 (ICD-10-CM) - Hx  of below knee amputation, right   ONSET DATE: 06/19/2022 Prosthesis delivery  THERAPY DIAG:  Other abnormalities of gait and mobility  Muscle weakness (generalized)  Unsteadiness on feet  Non-pressure chronic ulcer of skin of other sites with other specified severity ([name])  Foot drop, left  Pain in right lower leg  Rationale for Evaluation and Treatment Rehabilitation  PERTINENT HISTORY: Rt BKA 03/15/22, Left MTA 03/15/2022, bil. TKA 11/04/18, ESRD / chronic kidney disease st5 dialysis Tues/Thurs/Sat, cirrhosis, cardiac arrest May 23 post intubation, A-Fib  PRECAUTIONS: Fall, BP only in RUE starting 10/6  SUBJECTIVE:  The wound has healed. He is wearing prosthesis all awake hours. His right foot is very sensitive. He got small cut on it but it also healed.   PAIN:  Are you having pain? Distal residual limb  0/10 with standing & gait    OBJECTIVE: (objective measures completed at initial evaluation unless otherwise dated)    COGNITION: 06/22/2022  Overall cognitive status: Within functional limits for  tasks assessed and pt had flat affect so unable to fully assess cognition.   POSTURE: 06/22/2022  rounded shoulders, forward head, flexed trunk , and weight shift left   LOWER EXTREMITY ROM:   ROM P:passive  A:active Right Eval 06/22/2022 Left Eval 06/22/2022  Hip flexion      Hip extension Standing A: ~30* limitation Standing A: ~30* limitation  Hip abduction      Hip adduction      Hip internal rotation      Hip external rotation      Knee flexion      Knee extension Standing A: ~15* limitation Standing A: ~15* limitation  Ankle dorsiflexion   Standing A: ~25* limitation  Ankle plantarflexion      Ankle inversion      Ankle eversion       (Blank rows = not tested)   LOWER EXTREMITY MMT:   MMT Right Eval 06/22/2022 Left Eval 06/22/2022  Hip flexion 4/5 4/5  Hip extension 2/5 functional 2/5 functional  Hip abduction 2/5 functional 2/5 functional  Hip adduction      Hip internal rotation      Hip external rotation      Knee flexion      Knee extension 3-/5 functional 3-/5 functional  Ankle dorsiflexion 2-/5    Ankle plantarflexion      Ankle inversion      Ankle eversion      (Blank rows = not tested)   TRANSFERS: 06/22/2022  Sit to stand: Mod A and from 22" w/c requiring armrests to push & RW to stabilize 06/22/2022  Stand to sit: Min A from RW to 22" w/c unable to release RW to reach for w/c until seated.   GAIT: 06/22/2022 Gait pattern: step to pattern, decreased step length- Left, decreased stance time- Right, decreased stride length, decreased hip/knee flexion- Right, circumduction- Right, Right hip hike, knee flexed in stance- Right, knee flexed in stance- Left, antalgic, trunk flexed, abducted- Right, and poor foot clearance- Left excessive weightbearing BUEs on RW Distance walked: 5' Assistive device utilized: Environmental consultant - 2 wheeled and right TTA prosthesis / left plate & shoe filler Level of assistance: Mod A Comments: post gait HR 82, SpO2 99% with dyspnea  noted   FUNCTIONAL TESTs:  08/24/2022 Merrilee Jansky Balance 12/56   06/22/2022  Static standing balance with BUE support on RW for 1 min with minA.     CURRENT PROSTHETIC WEAR ASSESSMENT: 06/22/2022 Patient is dependent with: skin check, residual  limb care, care of non-amputated limb, prosthetic cleaning, ply sock cleaning, correct ply sock adjustment, proper wear schedule/adjustment, and proper weight-bearing schedule/adjustment Donning prosthesis: Total A and max cues required, Doffing prosthesis: Total A and wife able to doffe Prosthetic wear tolerance: 1-2 hours 1-2x/day, for 3 of 3 days since delivery.  Prosthetic weight bearing tolerance: 0 minutes unable to stand outside of PT Edema: pitting with >5sec capillary refill Residual limb condition: 34m wound with serous drainage & black scab present,  calf measurement is 42.2cm Prosthetic description:  silicon liner with pin suspension, flexible inner socket Total Contact design, dynamic response foot with hydraulic PF/DF K code/activity level with prosthetic use: Level 3     TODAY'S TREATMENT:  10/28/2022: Prosthetic Training with TTA prosthesis: PT instructed in residual limb pain vs phantom sensation vs phantom pain. Pt & wife verbalized understanding.  PT demo & verbal cues on rolling tennis seated. Pt return demo understanding.  PT reviewed with demo proper sit to stand to facilitate weight shifting over prosthesis sooner and initiating gait with less hesitancy.  Patient improved with repetition with supervision for safety. Upon patient request PT demo and instructed patient and returning to driving with the right lower extremity amputation.  PT discussed practicing and open large parking lot to improve foot work.  Patient and wife verbalized understanding. PT demo pelvic orientation for turning.  Patient able to ambulate weaving through cones about 6 feet apart.  Patient improved with instruction and repetition.  Worked on negotiating around  furniture with turns up to 90 degrees but she improved again with repetition and instruction. Patient and wife verbalized understanding of working on above activities at home.   10/14/2022 Prosthetic Training with TTA prosthesis: Pt arrived with new wound noted in subjective. Linear 564mwound on incision lateral distal tibia. No redness or warmth noted. But area is edematous & very painful with palpation. PT questioning if wound healed externally first & created a fluid pocket. He has appt at [facility] at 12:45 today. Pt arrived with inner flexible liner rotated externally and socket was properly aligned. PT pointed this issue out to pt & family and fact his limb was not seated fully into socket. Both of these facts make pressure tolerant areas in incorrect area. PT demo & verbal cues on donning correctly & need to stand enough to seat limb into socket. Pt verbalized understanding. PT also noticed some superficial scratches on anterior limb.  Pt does report typical intense itching with removing prosthesis causing him to scratch his limb.  PT recommended having a cloth preferably a wet warm cloth handy when he is removing the prosthesis in order to rub them instead of scratch it with his nails.  PT also recommended during the day every 4-6 hours removing the prosthesis to dry the limb as that superficial light sweating can be the cause of itching.  Patient and wife verbalized understanding. PT recommended limiting weightbearing for now until some of the pain subsides due to how high the pain number is.  PT recommended using the walker as she can limit weight on his residual limb more with a walker than he can with a cane.  Patient and wife verbalized understanding. PT ended session early as further standing activities appear would increase his pain and therefore limit his activity progression.  10/08/2022 Therapeutic activity: Upon patient request PT demo and verbal cues on floor transfer using horizontal  seat of chair for a half kneeling transition.  Patient able to return demonstration including  all going from half kneeling to sitting on the floor with supervision.  Patient required min assist to go from sitting on the floor to a kneeling position.  Patient able to rise from the floor pushing on the chair bottom with contact-guard assistance.   Patient's request is to enable him to sit on the bottom of the tub for bath.  PT also discussed use of a stool as a transition.  PT recommending doffing the prosthesis once he is in the tub then filling it up and draining the tub and drying the bottom, donning the prosthesis prior to trying to get out of the tub.  Patient and wife verbalized understanding and agreement.  Sit to / from stand with PT demo and verbal cues on proper technique.  Patient performed 5 reps requiring touch or minimal assistance to stabilize balance.  PT instructed in working on this with a kitchen table or counter in finding him.  PT recommended doing this 5-10 reps after each meal as he sits at the kitchen table to eat his meals.  Patient and wife verbalized understanding.  Prosthetic training with transtibial prosthesis: PT instructed patient in donning prosthesis with long pants and drying/adjusting socks while wearing long pants.  Patient and wife verbalized understanding.  Therapeutic exercise: Step up & down 4" block with counter & cane support: PT demo & verbal cues on technique. Pt performed 2 reps leading with ea LE. PT explained rationale for exercise & how to set up for HEP. Pt return demo and both verbalized understanding.    HOME EXERCISE PROGRAM:  AT [facility] FIND YOUR MIDLINE POSITION AND PLACE FEET EQUAL DISTANCE FROM THE MIDLINE.  Try to find this position when standing still for activities.   USE TAPE ON FLOOR TO MARK THE MIDLINE POSITION which is even with middle of sink.  You also should try to feel with your limb pressure in socket.  You are trying to feel with limb  what you used to feel with the bottom of your foot.  Side to Side Shift: Moving your hips only (not shoulders): move weight onto your left leg, HOLD/FEEL pressure in socket.  Move back to equal weight on each leg, HOLD/FEEL pressure in socket. Move weight onto your right leg, HOLD/FEEL pressure in socket. Move back to equal weight on each leg, HOLD/FEEL pressure in socket. Repeat.  Start with both hands on sink, progress to hand on prosthetic side only, then no hands.  Front to Back Shift: Moving your hips only (not shoulders): move your weight forward onto your toes, HOLD/FEEL pressure in socket. Move your weight back to equal Flat Foot on both legs, HOLD/FEEL  pressure in socket. Move your weight back onto your heels, HOLD/FEEL  pressure in socket. Move your weight back to equal on both legs, HOLD/FEEL  pressure in socket. Repeat.  Start with both hands on sink, progress to hand on prosthetic side only, then no hands.  Moving Cones / Cups: With equal weight on each leg: Hold on with one hand the first time, then progress to no hand supports. Move cups from one side of sink to the other. Place cups ~2" out of your reach, progress to 10" beyond reach.  Place one hand in middle of sink and reach with other hand. Do both arms.  Then hover one hand and move cups with other hand.  Overhead/Upward Reaching: alternated reaching up to top cabinets or ceiling if no cabinets present. Keep equal weight on each leg. Start with  one hand support on counter while other hand reaches and progress to no hand support with reaching.  ace one hand in middle of sink and reach with other hand. Do both arms.  Then hover one hand and move cups with other hand.  5.   Looking Over Shoulders: With equal weight on each leg: alternate turning to look over your shoulders with one hand support on counter as needed.  Start with head motions only to look in front of shoulder, then even with shoulder and progress to looking behind you. To look  to side, move head /eyes, then shoulder on side looking pulls back, shift more weight to side looking and pull hip back. Place one hand in middle of sink and let go with other hand so your shoulder can pull back. Switch hands to look other way.   Then hover one hand and look over shoulder. If looking right, use left hand at sink. If looking left, use right hand at sink. 6.  Stepping with leg that is not amputated:  Move items under cabinet out of your way. Shift your hips/pelvis so weight on prosthesis. Tighten muscles in hip on prosthetic side.  SLOWLY step other leg so front of foot is in cabinet. Then step back to floor.   Pt also recommending sitting at edge of w/c with feet supported on floor but no back support each hour in w/c until back fatigues. PT also demo & instructed in sitting on 24" stool. Pt & wife verbalized understanding.     ASSESSMENT:   CLINICAL IMPRESSION: Patient and wife report wound on residual limb has healed.  He is tolerating wear prosthesis all awake hours without any issues.  He is walking inside the home now without a device about 50% of the time per patient report.  Patient's balance has improved significantly.  He appears on target to meet long-term goals by the end of the certification.   OBJECTIVE IMPAIRMENTS Abnormal gait, cardiopulmonary status limiting activity, decreased activity tolerance, decreased balance, decreased endurance, decreased knowledge of condition, decreased knowledge of use of DME, decreased mobility, difficulty walking, decreased ROM, decreased strength, increased edema, impaired flexibility, postural dysfunction, and prosthetic dependency .    ACTIVITY LIMITATIONS carrying, lifting, bending, sitting, standing, squatting, stairs, transfers, locomotion level, and prosthesis use   PARTICIPATION LIMITATIONS: driving, community activity, occupation, yard work, and recreational of camping   [place], Past/current experiences, Time  since onset of injury/illness/exacerbation, and 3+ comorbidities: see PMH  are also affecting patient's functional outcome.    REHAB POTENTIAL: Good   CLINICAL DECISION MAKING: Evolving/moderate complexity   EVALUATION COMPLEXITY: Moderate     GOALS: Goals reviewed with patient? Yes   SHORT TERM GOALS: Target date: 09/23/2022   Patient donnes new prosthesis modified independent & verbalizes adjusting ply socks.  Goal status: Met 09/23/2022 2.  Patient tolerates prosthesis >90% awake hrs /day without increase in skin issues & no limb pain. Goal status: Met 09/23/2022   3.  Patient able to stand 2 min without UE support with supervision. Goal status: Met 09/23/2022   4. Patient ambulates 13' with cane & prosthesis with minA. Goal status: Met 09/23/2022    UPDATED LONG TERM GOALS: Target date: 11/19/2022   Patient demonstrates & verbalized understanding of prosthetic care to enable safe utilization of prosthesis. Goal status: Ongoing 08/24/2022  2.   Patient tolerates prosthesis wear >90% of awake hours without skin or limb pain issues. Goal status: Ongoing 08/24/2022   3.  Berg Balance >/= 36/56 to indicate lower fall risk Goal status: Ongoing 08/24/2022   4.   Patient ambulates >300' with prosthesis & LRAD independently Goal status:Ongoing 08/24/2022   5.   Patient negotiates ramps, curbs & stairs with single rail with prosthesis & LRAD independently. Goal status: Ongoing 08/24/2022     PLAN: PT FREQUENCY: 1x/week for 8 additional visits    PT DURATION: 12 weeks   PLANNED INTERVENTIONS: Therapeutic exercises, Therapeutic activity, Neuromuscular re-education, Balance training, Gait training, Patient/Family education, Self Care, Stair training, Vestibular training, Canalith repositioning, Prosthetic training, DME instructions, Vasopneumatic device, and physical performance testing   PLAN FOR NEXT SESSION: Continue to progress balance & gait activities, continue 1x/wk due to  visit limit   Jamey Reas, PT, DPT 10/28/2022, 3:51 PM

## 2022-10-28 NOTE — Patient Instructions (Signed)
Medication Instructions:  START Eliquis when he calls to instruct you to start.   *If you need a refill on your cardiac medications before your next appointment, please call your pharmacy*   Lab Work: CBC, BMET today   If you have labs (blood work) drawn today and your tests are completely normal, you will receive your results only by: [place] (if you have MyChart) OR A paper copy in the mail If you have any lab test that is abnormal or we need to change your treatment, we will call you to review the results.   Testing/Procedures: Your physician has requested that you have a Cardioversion.  Electrical Cardioversion uses a jolt of electricity to your heart either through paddles or wired patches attached to your chest. This is a controlled, usually prescheduled, procedure. This procedure is done at the hospital and you are not awake during the procedure. You usually go home the day of the procedure. Please see the instruction sheet given to you today for more information.    Follow-Up: At [facility], you and your health needs are our priority.  As part of our continuing mission to provide you with exceptional heart care, we have created designated Provider Care Teams.  These Care Teams include your primary Cardiologist (physician) and Advanced Practice Providers (APPs -  Physician Assistants and Nurse Practitioners) who all work together to provide you with the care you need, when you need it.  We recommend signing up for the patient portal called "MyChart".  Sign up information is provided on this After Visit Summary.  MyChart is used to connect with patients for Virtual Visits (Telemedicine).  Patients are able to view lab/test results, encounter notes, upcoming appointments, etc.  Non-urgent messages can be sent to your provider as well.   To learn more about what you can do with MyChart, go to [email or web address].    Your next appointment:   December 04, 2022 at  9:00 AM  The format for your next appointment:   In Person  Provider:   Evalina Field, MD     Other Instructions    Dear Jeffrey Ayala  You are scheduled for a Cardioversion on Friday, December 29 with Dr. Johney Frame.  Please arrive at the [facility] (Main Entrance A) at [facility]: [address] [place], [place] [address] at 9:00 AM.   DIET:  Nothing to eat or drink after midnight except a sip of water with medications (see medication instructions below)  MEDICATION INSTRUCTIONS: Continue Eliquis when you start.   LABS: CBC, BMET today   FYI:  For your safety, and to allow [place] to monitor your vital signs accurately during the surgery/procedure we request: If you have artificial nails, gel coating, SNS etc, please have those removed prior to your surgery/procedure. Not having the nail coverings /polish removed may result in cancellation or delay of your surgery/procedure.  You must have a responsible person to drive you home and stay in the waiting area during your procedure. Failure to do so could result in cancellation.  Bring your insurance cards.  *Special Note: Every effort is made to have your procedure done on time. Occasionally there are emergencies that occur at the hospital that may cause delays. Please be patient if a delay does occur.

## 2022-10-28 NOTE — Addendum Note (Signed)
Addended by: Caprice Beaver T on: 10/28/2022 05:19 PM   Modules accepted: Orders

## 2022-10-29 ENCOUNTER — Encounter (HOSPITAL_COMMUNITY): Payer: Self-pay | Admitting: Gastroenterology

## 2022-10-29 LAB — CBC
Hematocrit: 30.1 % — ABNORMAL LOW (ref 37.5–51.0)
Hemoglobin: 9.8 g/dL — ABNORMAL LOW (ref 13.0–17.7)
MCH: 32.6 pg (ref 26.6–33.0)
MCHC: 32.6 g/dL (ref 31.5–35.7)
MCV: 100 fL — ABNORMAL HIGH (ref 79–97)
Platelets: 193 10*3/uL (ref 150–450)
RBC: 3.01 x10E6/uL — ABNORMAL LOW (ref 4.14–5.80)
RDW: 16.7 % — ABNORMAL HIGH (ref 11.6–15.4)
WBC: 6.4 10*3/uL (ref 3.4–10.8)

## 2022-10-29 LAB — BASIC METABOLIC PANEL
BUN/Creatinine Ratio: 9 — ABNORMAL LOW (ref 10–24)
BUN: 26 mg/dL (ref 8–27)
CO2: 23 mmol/L (ref 20–29)
Calcium: 9 mg/dL (ref 8.6–10.2)
Chloride: 103 mmol/L (ref 96–106)
Creatinine, Ser: 3.01 mg/dL — ABNORMAL HIGH (ref 0.76–1.27)
Glucose: 134 mg/dL — ABNORMAL HIGH (ref 70–99)
Potassium: 3.2 mmol/L — ABNORMAL LOW (ref 3.5–5.2)
Sodium: 142 mmol/L (ref 134–144)
eGFR: 23 mL/min/{1.73_m2} — ABNORMAL LOW (ref >59)

## 2022-11-01 ENCOUNTER — Other Ambulatory Visit: Payer: Self-pay | Admitting: Registered Nurse

## 2022-11-02 ENCOUNTER — Encounter: Payer: Self-pay | Admitting: Orthopedic Surgery

## 2022-11-02 ENCOUNTER — Ambulatory Visit (INDEPENDENT_AMBULATORY_CARE_PROVIDER_SITE_OTHER): Payer: BC Managed Care – PPO | Admitting: Orthopedic Surgery

## 2022-11-02 DIAGNOSIS — Z89511 Acquired absence of right leg below knee: Secondary | ICD-10-CM

## 2022-11-02 DIAGNOSIS — Z89432 Acquired absence of left foot: Secondary | ICD-10-CM | POA: Diagnosis not present

## 2022-11-02 DIAGNOSIS — M6702 Short Achilles tendon (acquired), left ankle: Secondary | ICD-10-CM | POA: Diagnosis not present

## 2022-11-02 NOTE — Progress Notes (Signed)
Office Visit Note   Patient: Jeffrey Ayala           Date of Birth: 1961-04-01           MRN: [id number] Visit Date: 11/02/2022              Requested by: [name], NP-C [place],  [place] [address] PCP: [name], NP-C  Chief Complaint  Patient presents with   Right Leg - Follow-up      HPI: Patient is a 61 year old gentleman status post right below-knee amputation.  He states he has a new socket about 3 months ago.  He feels like he is making good progress with his right leg.  Patient states he is having increasing burning pain over the transmetatarsal amputation.  Assessment & Plan: Visit Diagnoses:  1. Achilles tendon contracture, left   2. Hx of below knee amputation, right ([place])   3. History of transmetatarsal amputation of left foot ([place])     Plan: Patient was given instructions and demonstrated Achilles stretching to unload the forefoot on the left.  Discussed that he may need a gastrocnemius recession if we cannot stretch this conservatively.  Follow-Up Instructions: Return in about 3 months (around 02/01/2023).   Ortho Exam  Patient is alert, oriented, no adenopathy, well-dressed, normal affect, normal respiratory effort. Examination patient has a well-healed right below-knee amputation no ulcers no swelling.  Examination of the left foot there is no open ulcers no cellulitis he has an equinus contracture and lacks about 20 degrees of dorsiflexion to neutral.  Imaging: No results found. No images are attached to the encounter.  Labs: Lab Results  Component Value Date   HGBA1C 5.5 02/23/2022   ESRSEDRATE 20 (H) 02/22/2022   CRP 13.8 (H) 02/23/2022   LABURIC 8.5 (H) 05/27/2022   LABURIC 6.3 11/14/2018   LABURIC 7.4 04/10/2014   REPTSTATUS 04/05/2022 FINAL 03/16/2022   GRAMSTAIN  03/16/2022    NO WBC SEEN ABUNDANT GRAM POSITIVE COCCI FEW GRAM NEGATIVE RODS    CULT  03/16/2022    ABUNDANT ENTEROCOCCUS FAECALIS FEW  PSEUDOMONAS AERUGINOSA MULTI-DRUG RESISTANT ORGANISM ABUNDANT BACTEROIDES OVATUS BETA LACTAMASE POSITIVE CRITICAL RESULT CALLED TO, READ BACK BY AND VERIFIED WITH: PHARMD K PIERCE [id number] AT 1019 BY CM Sent to [place] for further susceptibility testing. SEE REPORT IN EPIC Performed at [facility], [place] [address] [place], [place] [address]    LABORGA ENTEROCOCCUS FAECALIS 03/16/2022   LABORGA PSEUDOMONAS AERUGINOSA 03/16/2022     Lab Results  Component Value Date   ALBUMIN 3.5 08/15/2022   ALBUMIN 4.0 06/09/2022   ALBUMIN 4.2 05/27/2022    Lab Results  Component Value Date   MG 1.9 04/18/2022   MG 2.1 04/01/2022   MG 2.0 03/31/2022   No results found for: "VD25OH"  No results found for: "PREALBUMIN"    Latest Ref Rng & Units 10/28/2022    8:58 AM 10/19/2022    9:29 AM 10/01/2022   10:01 AM  CBC EXTENDED  WBC 3.4 - 10.8 x10E3/uL 6.4   5.4   RBC 4.14 - 5.80 x10E6/uL 3.01   2.37   Hemoglobin 13.0 - 17.7 g/dL 9.8  9.1  7.9 Repeated and verified X2.   HCT 37.5 - 51.0 % 30.1   24.0   Platelets 150 - 450 x10E3/uL 193   172.0   NEUT# 1.4 - 7.7 K/uL   4.1   Lymph# 0.7 - 4.0 K/uL   0.8  There is no height or weight on file to calculate BMI.  Orders:  No orders of the defined types were placed in this encounter.  No orders of the defined types were placed in this encounter.    Procedures: No procedures performed  Clinical Data: No additional findings.  ROS:  All other systems negative, except as noted in the HPI. Review of Systems  Objective: Vital Signs: There were no vitals taken for this visit.  Specialty Comments:  No specialty comments available.  PMFS History: Patient Active Problem List   Diagnosis Date Noted   Benign neoplasm of colon 10/26/2022   Gastric polyp 10/26/2022   Suspected sleep apnea 10/01/2022   Daytime somnolence 10/01/2022   Snoring 10/01/2022   Acute bilateral obstructive uropathy 08/15/2022   Skin breakdown  07/29/2022   Suicidal ideations 06/09/2022   Chills (without fever) 06/09/2022   Severe depression ([place]) 06/09/2022   Acute cough 06/09/2022   End stage renal disease ([place]) 06/02/2022   Reactive depression 05/31/2022   Belching 05/27/2022   Gastroesophageal reflux disease 05/27/2022   Atrial fibrillation ([place]) 05/20/2022   Long term (current) use of anticoagulants 05/20/2022   S/P transmetatarsal amputation of foot, left ([place]) 04/18/2022   CKD (chronic kidney disease) stage 5, GFR less than 15 ml/min ([place]) 04/18/2022   Secondary hyperparathyroidism of renal origin ([place]) 04/10/2022   Pruritus, unspecified 04/10/2022   Nausea 04/10/2022   Iron deficiency anemia, unspecified 04/10/2022   Hypothyroidism, unspecified 04/10/2022   Other acute kidney failure ([place]) 04/10/2022   Dyspnea, unspecified 78/29/5621   Eosinophilic esophagitis 30/86/5784   Gastric ulcer, unspecified as acute or chronic, without hemorrhage or perforation 04/10/2022   Coagulation defect, unspecified ([place]) 04/10/2022   Allergy, unspecified, initial encounter 04/10/2022   Anaphylactic shock, unspecified, initial encounter 04/10/2022   Arthralgia of right lower leg    Acute blood loss anemia    Unilateral complete BKA, right, initial encounter ([place]) 04/03/2022   S/P BKA (below knee amputation), right ([place]) 04/03/2022   Old myocardial infarction 04/02/2022   Dependence on renal dialysis ([place]) 04/02/2022   Acquired absence of right leg below knee ([place]) 04/02/2022   Cirrhosis ([place]) 03/31/2022   Fournier's gangrene in male    Scrotal infection 03/25/2022   Anemia 03/25/2022   Diarrhea 03/25/2022   Shock liver 03/25/2022   Acute respiratory failure with hypoxia ([place]) 03/25/2022   Gout 03/25/2022   Acute nonintractable headache 03/25/2022   Demand ischemia 03/25/2022   Thrombocytopenia ([place]) 03/25/2022   Obesity, Class III, BMI 40-49.9 (morbid obesity) ([place]) 03/25/2022   Cardiac arrest ([place]) 03/25/2022    Hypotension    Respiratory insufficiency    Cellulitis    Malnutrition of moderate degree 03/12/2022   Altered mental status    Gangrene of right foot ([place])    Gangrene of left foot ([place])    Severe protein-calorie malnutrition ([place]) 03/10/2022   Colon cancer screening    Pressure injury of skin 02/23/2022   Septic shock ([place]) 02/22/2022   AKI (acute kidney injury) ([place])    Generalized abdominal pain    Lactic acidosis    Atrial fibrillation with RVR ([name])    Hypokalemia    Hypomagnesemia    Pain in finger of right hand 08/14/2021   Degenerative joint disease of shoulder region 05/07/2020   Olecranon bursitis of right elbow 05/07/2020   Pain in joint of right shoulder 04/26/2020   S/P TKR (total knee replacement), bilateral 11/04/2018   Bilateral primary osteoarthritis of knee  DDD (degenerative disc disease), cervical 01/10/2018   Rupture of medial head of gastrocnemius, left, sequela 01/14/2017   Kidney stones 06/22/2016   Essential hypertension 08/01/2010   DYSTHYMIC DISORDER 07/08/2010   PALPITATIONS 07/08/2010   Past Medical History:  Diagnosis Date   A-fib [facility])    Acute nonintractable headache 03/25/2022   Anemia    Arthritis    Atrial fibrillation, chronic ([name])    not on [facility]   Cardiac arrest ([place]) 02/2022   in the setting of septic shock   Cirrhosis of liver ([name])    Concussion    Dysrhythmia    afib   Eosinophilic esophagitis    EGD/bx by Dr. 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 and R BKA    Family History  Problem Relation Age of Onset   Arrhythmia Mother    COPD Mother    Sarcoidosis Brother    Cancer Brother    Stomach cancer Neg Hx    Pancreatic cancer Neg Hx    Esophageal cancer Neg Hx    Colon cancer Neg Hx    Rectal cancer Neg Hx      Past Surgical History:  Procedure Laterality Date   AMPUTATION Right 03/15/2022   Procedure: RIGHT BELOW KNEE AMPUTATION;  Surgeon: [name], MD;  Location: [place];  Service: Orthopedics;  Laterality: Right;   AV FISTULA PLACEMENT Left 08/28/2022   Procedure: LEFT ARM ARTERIOVENOUS (AV) FISTULA CREATION;  Surgeon: Waynetta Sandy, MD;  Location: [place];  Service: Vascular;  Laterality: Left;   BIOPSY  10/26/2022   Procedure: BIOPSY;  Surgeon: Yetta Flock, MD;  Location: [facility];  Service: Gastroenterology;;   COLONOSCOPY WITH PROPOFOL N/A 10/26/2022   Procedure: COLONOSCOPY WITH PROPOFOL;  Surgeon: Yetta Flock, MD;  [facility]: [facility];  Service: Gastroenterology;  Laterality: N/A;   CYSTOSCOPY     CYSTOSCOPY WITH STENT PLACEMENT Bilateral 08/15/2022   Procedure: CYSTOSCOPY WITH STENT PLACEMENT;  Surgeon: Remi Haggard, MD;  Location: [place];  Service: Urology;  Laterality: Bilateral;   CYSTOSCOPY/URETEROSCOPY/HOLMIUM LASER/STENT PLACEMENT Bilateral 09/14/2022   Procedure: CYSTOSCOPY/URETEROSCOPY/HOLMIUM LASER/STENT PLACEMENT;  Surgeon: Raynelle Bring, MD;  [facility]: [facility];  Service: Urology;  Laterality: Bilateral;   DENTAL SURGERY     ESOPHAGOGASTRODUODENOSCOPY (EGD) WITH PROPOFOL N/A 10/26/2022   Procedure: ESOPHAGOGASTRODUODENOSCOPY (EGD) WITH PROPOFOL;  Surgeon: Yetta Flock, MD;  [facility];  Service: Gastroenterology;  Laterality: N/A;   FINGER SURGERY     HERNIA REPAIR     at age 71   IR FLUORO GUIDE CV LINE RIGHT  03/09/2022   IR PATIENT EVAL TECH 0-60 MINS  03/09/2022   IR US GUIDE VASC ACCESS RIGHT  03/09/2022   KNEE ARTHROSCOPY Left    LITHOTRIPSY     NEPHROLITHOTOMY     POLYPECTOMY  10/26/2022   Procedure: POLYPECTOMY;  Surgeon: Yetta Flock, MD;  [facility];  Service: Gastroenterology;;   Carolynn Serve EXPLORATION N/A 03/16/2022   Procedure: IRRIGATION AND DEBRIDEMENT SCROTUM;  Surgeon: Raynelle Bring,  MD;  Location: [place];  Service: Urology;  Laterality: N/A;   SCROTAL EXPLORATION N/A 03/28/2022   Procedure: IRRIGATION AND DEBRIDEMENT SCROTUM;  Surgeon: Ardis Hughs, MD;  Location: [place];  Service:  Urology;  Laterality: N/A;   TONSILLECTOMY     TOTAL KNEE ARTHROPLASTY Bilateral 11/04/2018   Procedure: BILATERAL TOTAL KNEE ARTHROPLASTY;  Surgeon: [name], MD;  Location: [place];  Service: Orthopedics;  Laterality: Bilateral;  spinal/epidural per anesthesiologist   TRANSMETATARSAL AMPUTATION Left 03/15/2022   Procedure: LEFT TRANSMETATARSAL AMPUTATION;  Surgeon: [name], MD;  Location: [place];  Service: Orthopedics;  Laterality: Left;   Social History   Occupational History   Occupation: Airline pilot  Tobacco Use   Smoking status: Never   Smokeless tobacco: Never  Vaping Use   Vaping Use: Never used  Substance and Sexual Activity   Alcohol use: Not Currently    Comment: occassional   Drug use: No   Sexual activity: Not Currently

## 2022-11-03 ENCOUNTER — Ambulatory Visit (HOSPITAL_BASED_OUTPATIENT_CLINIC_OR_DEPARTMENT_OTHER): Payer: BC Managed Care – PPO | Attending: Family Medicine | Admitting: Internal Medicine

## 2022-11-03 ENCOUNTER — Encounter: Payer: Self-pay | Admitting: Orthopedic Surgery

## 2022-11-03 DIAGNOSIS — R0683 Snoring: Secondary | ICD-10-CM | POA: Diagnosis not present

## 2022-11-03 DIAGNOSIS — R400 Somnolence: Secondary | ICD-10-CM | POA: Diagnosis not present

## 2022-11-03 DIAGNOSIS — R29818 Other symptoms and signs involving the nervous system: Secondary | ICD-10-CM

## 2022-11-03 DIAGNOSIS — G4733 Obstructive sleep apnea (adult) (pediatric): Secondary | ICD-10-CM | POA: Insufficient documentation

## 2022-11-04 ENCOUNTER — Ambulatory Visit (INDEPENDENT_AMBULATORY_CARE_PROVIDER_SITE_OTHER): Payer: BC Managed Care – PPO | Admitting: Physical Therapy

## 2022-11-04 ENCOUNTER — Encounter: Payer: Self-pay | Admitting: Physical Therapy

## 2022-11-04 DIAGNOSIS — R2681 Unsteadiness on feet: Secondary | ICD-10-CM | POA: Diagnosis not present

## 2022-11-04 DIAGNOSIS — R2689 Other abnormalities of gait and mobility: Secondary | ICD-10-CM

## 2022-11-04 DIAGNOSIS — M6281 Muscle weakness (generalized): Secondary | ICD-10-CM

## 2022-11-04 DIAGNOSIS — L98498 Non-pressure chronic ulcer of skin of other sites with other specified severity: Secondary | ICD-10-CM

## 2022-11-04 DIAGNOSIS — M21372 Foot drop, left foot: Secondary | ICD-10-CM

## 2022-11-04 DIAGNOSIS — M79661 Pain in right lower leg: Secondary | ICD-10-CM

## 2022-11-04 MED ORDER — GABAPENTIN 100 MG PO CAPS: 100.0000 mg | ORAL_CAPSULE | Freq: Three times a day (TID) | ORAL | 3 refills | Status: DC

## 2022-11-04 NOTE — Therapy (Signed)
OUTPATIENT PHYSICAL THERAPY TREATMENT NOTE  Patient Name: Jeffrey Ayala MRN: [id number] DOB:06/25/1961, 61 y.o., male Today's Date: 11/04/2022  PCP: Henson, Vickie L, NP-C REFERRING PROVIDER: Duda, Marcus V, MD   END OF SESSION:   PT End of Session - 11/04/22 0802     Visit Number 19    Number of Visits 20    Date for PT Re-Evaluation 11/20/22    Authorization Type BCBS comm PPO    Authorization Time Period 20 visit limit PT/OT/chiro, $35 co-pay    Authorization - Number of Visits 20    Progress Note Due on Visit 20    PT Start Time 0801    PT Stop Time 0845    PT Time Calculation (min) 44 min    Equipment Utilized During Treatment Gait belt    Activity Tolerance Patient tolerated treatment well;Patient limited by fatigue    Behavior During Therapy WFL for tasks assessed/performed;Flat affect                     Past Medical History:  Diagnosis Date   A-fib ([name])    Acute nonintractable headache 03/25/2022   Anemia    Arthritis    Atrial fibrillation, chronic ([name])    not on AC   Cardiac arrest ([name]) 02/2022   in the setting of septic shock   Cirrhosis of liver ([name])    Concussion    Dysrhythmia    afib   Eosinophilic esophagitis    EGD/bx by Dr. 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   Past Surgical History:  Procedure Laterality Date   AMPUTATION Right 03/15/2022   Procedure: RIGHT BELOW KNEE AMPUTATION;  Surgeon: Duda, Marcus V, MD;  [facility];  Service: Orthopedics;  Laterality: Right;   AV FISTULA PLACEMENT Left 08/28/2022   Procedure: LEFT ARM ARTERIOVENOUS (AV) FISTULA CREATION;  Surgeon: Cain, Brandon Christopher, MD;  [facility];  Service: Vascular;  Laterality: Left;   BIOPSY  10/26/2022   Procedure: BIOPSY;   Surgeon: Armbruster, Steven P, MD;  [facility];  Service: Gastroenterology;;   COLONOSCOPY WITH PROPOFOL N/A 10/26/2022   Procedure: COLONOSCOPY WITH PROPOFOL;  Surgeon: Armbruster, Steven P, MD;  [facility];  Service: Gastroenterology;  Laterality: N/A;   CYSTOSCOPY     CYSTOSCOPY WITH STENT PLACEMENT Bilateral 08/15/2022   Procedure: CYSTOSCOPY WITH STENT PLACEMENT;  Surgeon: Newsome, George B, MD;  [facility];  Service: Urology;  Laterality: Bilateral;   CYSTOSCOPY/URETEROSCOPY/HOLMIUM LASER/STENT PLACEMENT Bilateral 09/14/2022   Procedure: CYSTOSCOPY/URETEROSCOPY/HOLMIUM LASER/STENT PLACEMENT;  Surgeon: Borden, Lester, MD;  [facility];  Service: Urology;  Laterality: Bilateral;   DENTAL SURGERY     ESOPHAGOGASTRODUODENOSCOPY (EGD) WITH PROPOFOL N/A 10/26/2022   Procedure: ESOPHAGOGASTRODUODENOSCOPY (EGD) WITH PROPOFOL;  Surgeon: Armbruster, Steven P, MD;  [facility];  Service: Gastroenterology;  Laterality: N/A;   FINGER SURGERY     HERNIA REPAIR     at age 4   IR FLUORO GUIDE CV LINE RIGHT  03/09/2022   IR PATIENT EVAL TECH 0-60 MINS  03/09/2022   IR US GUIDE VASC ACCESS RIGHT  03/09/2022   KNEE ARTHROSCOPY Left    LITHOTRIPSY     NEPHROLITHOTOMY       OUTPATIENT PHYSICAL THERAPY TREATMENT NOTE  Patient Name: Jeffrey Ayala MRN: [id number] DOB:11/09/1961, 61 y.o., male Today's Date: 11/04/2022  PCP: Henson, Vickie L, NP-C REFERRING PROVIDER: Duda, Marcus V, MD   END OF SESSION:   PT End of Session - 11/04/22 0802     Visit Number 19    Number of Visits 20    Date for PT Re-Evaluation 11/20/22    Authorization Type BCBS comm PPO    Authorization Time Period 20 visit limit PT/OT/chiro, $35 co-pay    Authorization - Number of Visits 20    Progress Note Due on Visit 20    PT Start Time 0801    PT Stop Time 0845    PT Time Calculation (min) 44 min    Equipment Utilized During Treatment Gait belt    Activity Tolerance Patient tolerated treatment well;Patient limited by fatigue    Behavior During Therapy WFL for tasks assessed/performed;Flat affect                     Past Medical History:  Diagnosis Date   A-fib ([name])    Acute nonintractable headache 03/25/2022   Anemia    Arthritis    Atrial fibrillation, chronic ([name])    not on AC   Cardiac arrest ([name]) 02/2022   in the setting of septic shock   Cirrhosis of liver ([name])    Concussion    Dysrhythmia    afib   Eosinophilic esophagitis    EGD/bx by Dr. 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   Past Surgical History:  Procedure Laterality Date   AMPUTATION Right 03/15/2022   Procedure: RIGHT BELOW KNEE AMPUTATION;  Surgeon: Duda, Marcus V, MD;  [facility];  Service: Orthopedics;  Laterality: Right;   AV FISTULA PLACEMENT Left 08/28/2022   Procedure: LEFT ARM ARTERIOVENOUS (AV) FISTULA CREATION;  Surgeon: Cain, Brandon Christopher, MD;  [facility];  Service: Vascular;  Laterality: Left;   BIOPSY  10/26/2022   Procedure: BIOPSY;   Surgeon: Armbruster, Steven P, MD;  [facility];  Service: Gastroenterology;;   COLONOSCOPY WITH PROPOFOL N/A 10/26/2022   Procedure: COLONOSCOPY WITH PROPOFOL;  Surgeon: Armbruster, Steven P, MD;  [facility];  Service: Gastroenterology;  Laterality: N/A;   CYSTOSCOPY     CYSTOSCOPY WITH STENT PLACEMENT Bilateral 08/15/2022   Procedure: CYSTOSCOPY WITH STENT PLACEMENT;  Surgeon: Newsome, George B, MD;  [facility];  Service: Urology;  Laterality: Bilateral;   CYSTOSCOPY/URETEROSCOPY/HOLMIUM LASER/STENT PLACEMENT Bilateral 09/14/2022   Procedure: CYSTOSCOPY/URETEROSCOPY/HOLMIUM LASER/STENT PLACEMENT;  Surgeon: Borden, Lester, MD;  [facility];  Service: Urology;  Laterality: Bilateral;   DENTAL SURGERY     ESOPHAGOGASTRODUODENOSCOPY (EGD) WITH PROPOFOL N/A 10/26/2022   Procedure: ESOPHAGOGASTRODUODENOSCOPY (EGD) WITH PROPOFOL;  Surgeon: Armbruster, Steven P, MD;  [facility];  Service: Gastroenterology;  Laterality: N/A;   FINGER SURGERY     HERNIA REPAIR     at age 4   IR FLUORO GUIDE CV LINE RIGHT  03/09/2022   IR PATIENT EVAL TECH 0-60 MINS  03/09/2022   IR US GUIDE VASC ACCESS RIGHT  03/09/2022   KNEE ARTHROSCOPY Left    LITHOTRIPSY     NEPHROLITHOTOMY       OUTPATIENT PHYSICAL THERAPY TREATMENT NOTE  Patient Name: Jeffrey Ayala MRN: [id number] DOB:11/09/1961, 61 y.o., male Today's Date: 11/04/2022  PCP: Henson, Vickie L, NP-C REFERRING PROVIDER: Duda, Marcus V, MD   END OF SESSION:   PT End of Session - 11/04/22 0802     Visit Number 19    Number of Visits 20    Date for PT Re-Evaluation 11/20/22    Authorization Type BCBS comm PPO    Authorization Time Period 20 visit limit PT/OT/chiro, $35 co-pay    Authorization - Number of Visits 20    Progress Note Due on Visit 20    PT Start Time 0801    PT Stop Time 0845    PT Time Calculation (min) 44 min    Equipment Utilized During Treatment Gait belt    Activity Tolerance Patient tolerated treatment well;Patient limited by fatigue    Behavior During Therapy WFL for tasks assessed/performed;Flat affect                     Past Medical History:  Diagnosis Date   A-fib ([name])    Acute nonintractable headache 03/25/2022   Anemia    Arthritis    Atrial fibrillation, chronic ([name])    not on AC   Cardiac arrest ([name]) 02/2022   in the setting of septic shock   Cirrhosis of liver ([name])    Concussion    Dysrhythmia    afib   Eosinophilic esophagitis    EGD/bx by Dr. 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   Past Surgical History:  Procedure Laterality Date   AMPUTATION Right 03/15/2022   Procedure: RIGHT BELOW KNEE AMPUTATION;  Surgeon: Duda, Marcus V, MD;  [facility];  Service: Orthopedics;  Laterality: Right;   AV FISTULA PLACEMENT Left 08/28/2022   Procedure: LEFT ARM ARTERIOVENOUS (AV) FISTULA CREATION;  Surgeon: Cain, Brandon Christopher, MD;  [facility];  Service: Vascular;  Laterality: Left;   BIOPSY  10/26/2022   Procedure: BIOPSY;   Surgeon: Armbruster, Steven P, MD;  [facility];  Service: Gastroenterology;;   COLONOSCOPY WITH PROPOFOL N/A 10/26/2022   Procedure: COLONOSCOPY WITH PROPOFOL;  Surgeon: Armbruster, Steven P, MD;  [facility];  Service: Gastroenterology;  Laterality: N/A;   CYSTOSCOPY     CYSTOSCOPY WITH STENT PLACEMENT Bilateral 08/15/2022   Procedure: CYSTOSCOPY WITH STENT PLACEMENT;  Surgeon: Newsome, George B, MD;  [facility];  Service: Urology;  Laterality: Bilateral;   CYSTOSCOPY/URETEROSCOPY/HOLMIUM LASER/STENT PLACEMENT Bilateral 09/14/2022   Procedure: CYSTOSCOPY/URETEROSCOPY/HOLMIUM LASER/STENT PLACEMENT;  Surgeon: Borden, Lester, MD;  [facility];  Service: Urology;  Laterality: Bilateral;   DENTAL SURGERY     ESOPHAGOGASTRODUODENOSCOPY (EGD) WITH PROPOFOL N/A 10/26/2022   Procedure: ESOPHAGOGASTRODUODENOSCOPY (EGD) WITH PROPOFOL;  Surgeon: Armbruster, Steven P, MD;  [facility];  Service: Gastroenterology;  Laterality: N/A;   FINGER SURGERY     HERNIA REPAIR     at age 4   IR FLUORO GUIDE CV LINE RIGHT  03/09/2022   IR PATIENT EVAL TECH 0-60 MINS  03/09/2022   IR US GUIDE VASC ACCESS RIGHT  03/09/2022   KNEE ARTHROSCOPY Left    LITHOTRIPSY     NEPHROLITHOTOMY       OUTPATIENT PHYSICAL THERAPY TREATMENT NOTE  Patient Name: Jeffrey Ayala MRN: [id number] DOB:11/09/1961, 61 y.o., male Today's Date: 11/04/2022  PCP: Henson, Vickie L, NP-C REFERRING PROVIDER: Duda, Marcus V, MD   END OF SESSION:   PT End of Session - 11/04/22 0802     Visit Number 19    Number of Visits 20    Date for PT Re-Evaluation 11/20/22    Authorization Type BCBS comm PPO    Authorization Time Period 20 visit limit PT/OT/chiro, $35 co-pay    Authorization - Number of Visits 20    Progress Note Due on Visit 20    PT Start Time 0801    PT Stop Time 0845    PT Time Calculation (min) 44 min    Equipment Utilized During Treatment Gait belt    Activity Tolerance Patient tolerated treatment well;Patient limited by fatigue    Behavior During Therapy WFL for tasks assessed/performed;Flat affect                     Past Medical History:  Diagnosis Date   A-fib ([name])    Acute nonintractable headache 03/25/2022   Anemia    Arthritis    Atrial fibrillation, chronic ([name])    not on AC   Cardiac arrest ([name]) 02/2022   in the setting of septic shock   Cirrhosis of liver ([name])    Concussion    Dysrhythmia    afib   Eosinophilic esophagitis    EGD/bx by Dr. 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   Past Surgical History:  Procedure Laterality Date   AMPUTATION Right 03/15/2022   Procedure: RIGHT BELOW KNEE AMPUTATION;  Surgeon: Duda, Marcus V, MD;  [facility];  Service: Orthopedics;  Laterality: Right;   AV FISTULA PLACEMENT Left 08/28/2022   Procedure: LEFT ARM ARTERIOVENOUS (AV) FISTULA CREATION;  Surgeon: Cain, Brandon Christopher, MD;  [facility];  Service: Vascular;  Laterality: Left;   BIOPSY  10/26/2022   Procedure: BIOPSY;   Surgeon: Armbruster, Steven P, MD;  [facility];  Service: Gastroenterology;;   COLONOSCOPY WITH PROPOFOL N/A 10/26/2022   Procedure: COLONOSCOPY WITH PROPOFOL;  Surgeon: Armbruster, Steven P, MD;  [facility];  Service: Gastroenterology;  Laterality: N/A;   CYSTOSCOPY     CYSTOSCOPY WITH STENT PLACEMENT Bilateral 08/15/2022   Procedure: CYSTOSCOPY WITH STENT PLACEMENT;  Surgeon: Newsome, George B, MD;  [facility];  Service: Urology;  Laterality: Bilateral;   CYSTOSCOPY/URETEROSCOPY/HOLMIUM LASER/STENT PLACEMENT Bilateral 09/14/2022   Procedure: CYSTOSCOPY/URETEROSCOPY/HOLMIUM LASER/STENT PLACEMENT;  Surgeon: Borden, Lester, MD;  [facility];  Service: Urology;  Laterality: Bilateral;   DENTAL SURGERY     ESOPHAGOGASTRODUODENOSCOPY (EGD) WITH PROPOFOL N/A 10/26/2022   Procedure: ESOPHAGOGASTRODUODENOSCOPY (EGD) WITH PROPOFOL;  Surgeon: Armbruster, Steven P, MD;  [facility];  Service: Gastroenterology;  Laterality: N/A;   FINGER SURGERY     HERNIA REPAIR     at age 4   IR FLUORO GUIDE CV LINE RIGHT  03/09/2022   IR PATIENT EVAL TECH 0-60 MINS  03/09/2022   IR US GUIDE VASC ACCESS RIGHT  03/09/2022   KNEE ARTHROSCOPY Left    LITHOTRIPSY     NEPHROLITHOTOMY       OUTPATIENT PHYSICAL THERAPY TREATMENT NOTE  Patient Name: Jeffrey Ayala MRN: [id number] DOB:11/09/1961, 61 y.o., male Today's Date: 11/04/2022  PCP: Henson, Vickie L, NP-C REFERRING PROVIDER: Duda, Marcus V, MD   END OF SESSION:   PT End of Session - 11/04/22 0802     Visit Number 19    Number of Visits 20    Date for PT Re-Evaluation 11/20/22    Authorization Type BCBS comm PPO    Authorization Time Period 20 visit limit PT/OT/chiro, $35 co-pay    Authorization - Number of Visits 20    Progress Note Due on Visit 20    PT Start Time 0801    PT Stop Time 0845    PT Time Calculation (min) 44 min    Equipment Utilized During Treatment Gait belt    Activity Tolerance Patient tolerated treatment well;Patient limited by fatigue    Behavior During Therapy WFL for tasks assessed/performed;Flat affect                     Past Medical History:  Diagnosis Date   A-fib ([name])    Acute nonintractable headache 03/25/2022   Anemia    Arthritis    Atrial fibrillation, chronic ([name])    not on AC   Cardiac arrest ([name]) 02/2022   in the setting of septic shock   Cirrhosis of liver ([name])    Concussion    Dysrhythmia    afib   Eosinophilic esophagitis    EGD/bx by Dr. 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   Past Surgical History:  Procedure Laterality Date   AMPUTATION Right 03/15/2022   Procedure: RIGHT BELOW KNEE AMPUTATION;  Surgeon: Duda, Marcus V, MD;  [facility];  Service: Orthopedics;  Laterality: Right;   AV FISTULA PLACEMENT Left 08/28/2022   Procedure: LEFT ARM ARTERIOVENOUS (AV) FISTULA CREATION;  Surgeon: Cain, Brandon Christopher, MD;  [facility];  Service: Vascular;  Laterality: Left;   BIOPSY  10/26/2022   Procedure: BIOPSY;   Surgeon: Armbruster, Steven P, MD;  [facility];  Service: Gastroenterology;;   COLONOSCOPY WITH PROPOFOL N/A 10/26/2022   Procedure: COLONOSCOPY WITH PROPOFOL;  Surgeon: Armbruster, Steven P, MD;  [facility];  Service: Gastroenterology;  Laterality: N/A;   CYSTOSCOPY     CYSTOSCOPY WITH STENT PLACEMENT Bilateral 08/15/2022   Procedure: CYSTOSCOPY WITH STENT PLACEMENT;  Surgeon: Newsome, George B, MD;  [facility];  Service: Urology;  Laterality: Bilateral;   CYSTOSCOPY/URETEROSCOPY/HOLMIUM LASER/STENT PLACEMENT Bilateral 09/14/2022   Procedure: CYSTOSCOPY/URETEROSCOPY/HOLMIUM LASER/STENT PLACEMENT;  Surgeon: Borden, Lester, MD;  [facility];  Service: Urology;  Laterality: Bilateral;   DENTAL SURGERY     ESOPHAGOGASTRODUODENOSCOPY (EGD) WITH PROPOFOL N/A 10/26/2022   Procedure: ESOPHAGOGASTRODUODENOSCOPY (EGD) WITH PROPOFOL;  Surgeon: Armbruster, Steven P, MD;  [facility];  Service: Gastroenterology;  Laterality: N/A;   FINGER SURGERY     HERNIA REPAIR     at age 4   IR FLUORO GUIDE CV LINE RIGHT  03/09/2022   IR PATIENT EVAL TECH 0-60 MINS  03/09/2022   IR US GUIDE VASC ACCESS RIGHT  03/09/2022   KNEE ARTHROSCOPY Left    LITHOTRIPSY     NEPHROLITHOTOMY       OUTPATIENT PHYSICAL THERAPY TREATMENT NOTE  Patient Name: Jeffrey Ayala MRN: [id number] DOB:06/25/1961, 61 y.o., male Today's Date: 11/04/2022  PCP: Henson, Vickie L, NP-C REFERRING PROVIDER: Duda, Marcus V, MD   END OF SESSION:   PT End of Session - 11/04/22 0802     Visit Number 19    Number of Visits 20    Date for PT Re-Evaluation 11/20/22    Authorization Type BCBS comm PPO    Authorization Time Period 20 visit limit PT/OT/chiro, $35 co-pay    Authorization - Number of Visits 20    Progress Note Due on Visit 20    PT Start Time 0801    PT Stop Time 0845    PT Time Calculation (min) 44 min    Equipment Utilized During Treatment Gait belt    Activity Tolerance Patient tolerated treatment well;Patient limited by fatigue    Behavior During Therapy WFL for tasks assessed/performed;Flat affect                     Past Medical History:  Diagnosis Date   A-fib ([name])    Acute nonintractable headache 03/25/2022   Anemia    Arthritis    Atrial fibrillation, chronic ([name])    not on AC   Cardiac arrest ([name]) 02/2022   in the setting of septic shock   Cirrhosis of liver ([name])    Concussion    Dysrhythmia    afib   Eosinophilic esophagitis    EGD/bx by Dr. 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   Past Surgical History:  Procedure Laterality Date   AMPUTATION Right 03/15/2022   Procedure: RIGHT BELOW KNEE AMPUTATION;  Surgeon: Duda, Marcus V, MD;  [facility];  Service: Orthopedics;  Laterality: Right;   AV FISTULA PLACEMENT Left 08/28/2022   Procedure: LEFT ARM ARTERIOVENOUS (AV) FISTULA CREATION;  Surgeon: Cain, Brandon Christopher, MD;  [facility];  Service: Vascular;  Laterality: Left;   BIOPSY  10/26/2022   Procedure: BIOPSY;   Surgeon: Armbruster, Steven P, MD;  [facility];  Service: Gastroenterology;;   COLONOSCOPY WITH PROPOFOL N/A 10/26/2022   Procedure: COLONOSCOPY WITH PROPOFOL;  Surgeon: Armbruster, Steven P, MD;  [facility];  Service: Gastroenterology;  Laterality: N/A;   CYSTOSCOPY     CYSTOSCOPY WITH STENT PLACEMENT Bilateral 08/15/2022   Procedure: CYSTOSCOPY WITH STENT PLACEMENT;  Surgeon: Newsome, George B, MD;  [facility];  Service: Urology;  Laterality: Bilateral;   CYSTOSCOPY/URETEROSCOPY/HOLMIUM LASER/STENT PLACEMENT Bilateral 09/14/2022   Procedure: CYSTOSCOPY/URETEROSCOPY/HOLMIUM LASER/STENT PLACEMENT;  Surgeon: Borden, Lester, MD;  [facility];  Service: Urology;  Laterality: Bilateral;   DENTAL SURGERY     ESOPHAGOGASTRODUODENOSCOPY (EGD) WITH PROPOFOL N/A 10/26/2022   Procedure: ESOPHAGOGASTRODUODENOSCOPY (EGD) WITH PROPOFOL;  Surgeon: Armbruster, Steven P, MD;  [facility];  Service: Gastroenterology;  Laterality: N/A;   FINGER SURGERY     HERNIA REPAIR     at age 4   IR FLUORO GUIDE CV LINE RIGHT  03/09/2022   IR PATIENT EVAL TECH 0-60 MINS  03/09/2022   IR US GUIDE VASC ACCESS RIGHT  03/09/2022   KNEE ARTHROSCOPY Left    LITHOTRIPSY     NEPHROLITHOTOMY       OUTPATIENT PHYSICAL THERAPY TREATMENT NOTE  Patient Name: Jeffrey Ayala MRN: [id number] DOB:11/09/1961, 61 y.o., male Today's Date: 11/04/2022  PCP: Henson, Vickie L, NP-C REFERRING PROVIDER: Duda, Marcus V, MD   END OF SESSION:   PT End of Session - 11/04/22 0802     Visit Number 19    Number of Visits 20    Date for PT Re-Evaluation 11/20/22    Authorization Type BCBS comm PPO    Authorization Time Period 20 visit limit PT/OT/chiro, $35 co-pay    Authorization - Number of Visits 20    Progress Note Due on Visit 20    PT Start Time 0801    PT Stop Time 0845    PT Time Calculation (min) 44 min    Equipment Utilized During Treatment Gait belt    Activity Tolerance Patient tolerated treatment well;Patient limited by fatigue    Behavior During Therapy WFL for tasks assessed/performed;Flat affect                     Past Medical History:  Diagnosis Date   A-fib ([name])    Acute nonintractable headache 03/25/2022   Anemia    Arthritis    Atrial fibrillation, chronic ([name])    not on AC   Cardiac arrest ([name]) 02/2022   in the setting of septic shock   Cirrhosis of liver ([name])    Concussion    Dysrhythmia    afib   Eosinophilic esophagitis    EGD/bx by Dr. 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   Past Surgical History:  Procedure Laterality Date   AMPUTATION Right 03/15/2022   Procedure: RIGHT BELOW KNEE AMPUTATION;  Surgeon: Duda, Marcus V, MD;  [facility];  Service: Orthopedics;  Laterality: Right;   AV FISTULA PLACEMENT Left 08/28/2022   Procedure: LEFT ARM ARTERIOVENOUS (AV) FISTULA CREATION;  Surgeon: Cain, Brandon Christopher, MD;  [facility];  Service: Vascular;  Laterality: Left;   BIOPSY  10/26/2022   Procedure: BIOPSY;   Surgeon: Armbruster, Steven P, MD;  [facility];  Service: Gastroenterology;;   COLONOSCOPY WITH PROPOFOL N/A 10/26/2022   Procedure: COLONOSCOPY WITH PROPOFOL;  Surgeon: Armbruster, Steven P, MD;  [facility];  Service: Gastroenterology;  Laterality: N/A;   CYSTOSCOPY     CYSTOSCOPY WITH STENT PLACEMENT Bilateral 08/15/2022   Procedure: CYSTOSCOPY WITH STENT PLACEMENT;  Surgeon: Newsome, George B, MD;  [facility];  Service: Urology;  Laterality: Bilateral;   CYSTOSCOPY/URETEROSCOPY/HOLMIUM LASER/STENT PLACEMENT Bilateral 09/14/2022   Procedure: CYSTOSCOPY/URETEROSCOPY/HOLMIUM LASER/STENT PLACEMENT;  Surgeon: Borden, Lester, MD;  [facility];  Service: Urology;  Laterality: Bilateral;   DENTAL SURGERY     ESOPHAGOGASTRODUODENOSCOPY (EGD) WITH PROPOFOL N/A 10/26/2022   Procedure: ESOPHAGOGASTRODUODENOSCOPY (EGD) WITH PROPOFOL;  Surgeon: Armbruster, Steven P, MD;  [facility];  Service: Gastroenterology;  Laterality: N/A;   FINGER SURGERY     HERNIA REPAIR     at age 4   IR FLUORO GUIDE CV LINE RIGHT  03/09/2022   IR PATIENT EVAL TECH 0-60 MINS  03/09/2022   IR US GUIDE VASC ACCESS RIGHT  03/09/2022   KNEE ARTHROSCOPY Left    LITHOTRIPSY     NEPHROLITHOTOMY    

## 2022-11-08 ENCOUNTER — Encounter: Payer: Self-pay | Admitting: Family Medicine

## 2022-11-08 DIAGNOSIS — G4733 Obstructive sleep apnea (adult) (pediatric): Secondary | ICD-10-CM | POA: Insufficient documentation

## 2022-11-08 DIAGNOSIS — R0683 Snoring: Secondary | ICD-10-CM | POA: Diagnosis not present

## 2022-11-08 NOTE — Procedures (Signed)
     Patient Name: Jeffrey Ayala, Jeffrey Ayala Date: 11/03/2022 Gender: Male D.O.B: 1960-12-01 Age (years): 44 Referring Provider: [name] NP Height (inches): 14 Interpreting Physician: Baird Lyons MD, ABSM Weight (lbs): 225 RPSGT: Jacolyn Reedy BMI: 32 MRN: [id number] Neck Size: 17.00  CLINICAL INFORMATION Sleep Study Type: HST Indication for sleep study: OSA Epworth Sleepiness Score: 10  SLEEP STUDY TECHNIQUE A multi-channel overnight portable sleep study was performed. The channels recorded were: nasal airflow, thoracic respiratory movement, and oxygen saturation with a pulse oximetry. Snoring was also monitored.  MEDICATIONS Patient self administered medications include: none reported.  SLEEP ARCHITECTURE Patient was studied for 366.9 minutes. The sleep efficiency was 100.0 % and the patient was supine for 0%. The arousal index was 0.0 per hour.  RESPIRATORY PARAMETERS The overall AHI was 46.3 per hour, with a central apnea index of 0 per hour. The oxygen nadir was 70% during sleep.  CARDIAC DATA Mean heart rate during sleep was 82.5 bpm.  IMPRESSIONS - Severe obstructive sleep apnea occurred during this study (AHI = 46.3/h). - Oxygen desaturation was noted during this study (Min O2 = 70%). Mean 96% - Patient snored.  DIAGNOSIS - Obstructive Sleep Apnea (G47.33)  RECOMMENDATIONS - Suggest CPAP titration sleep study or autopap. Other options would be based on clinical judgment. - Avoid alcohol, sedatives and other CNS depressants that may worsen sleep apnea and disrupt normal sleep architecture. - Sleep hygiene should be reviewed to assess factors that may improve sleep quality. - Weight management and regular exercise should be initiated or continued.  [Electronically signed] 11/08/2022 10:38 AM  Baird Lyons MD, ABSM Diplomate, American Board of Sleep Medicine NPI: [id number]                        [address],  [facility]  ELECTRONICALLY SIGNED ON:  11/08/2022, 10:32 AM [place] PH: [phone]   FX: [phone] [place]

## 2022-11-09 ENCOUNTER — Other Ambulatory Visit: Payer: Self-pay

## 2022-11-09 DIAGNOSIS — G4733 Obstructive sleep apnea (adult) (pediatric): Secondary | ICD-10-CM

## 2022-11-09 NOTE — Progress Notes (Signed)
CPAP ordered

## 2022-11-11 ENCOUNTER — Ambulatory Visit (INDEPENDENT_AMBULATORY_CARE_PROVIDER_SITE_OTHER): Payer: BC Managed Care – PPO | Admitting: Physical Therapy

## 2022-11-11 ENCOUNTER — Encounter: Payer: Self-pay | Admitting: Physical Therapy

## 2022-11-11 DIAGNOSIS — R2681 Unsteadiness on feet: Secondary | ICD-10-CM

## 2022-11-11 DIAGNOSIS — R2689 Other abnormalities of gait and mobility: Secondary | ICD-10-CM

## 2022-11-11 DIAGNOSIS — M21372 Foot drop, left foot: Secondary | ICD-10-CM | POA: Diagnosis not present

## 2022-11-11 DIAGNOSIS — M79661 Pain in right lower leg: Secondary | ICD-10-CM

## 2022-11-11 DIAGNOSIS — M25672 Stiffness of left ankle, not elsewhere classified: Secondary | ICD-10-CM

## 2022-11-11 DIAGNOSIS — M6281 Muscle weakness (generalized): Secondary | ICD-10-CM | POA: Diagnosis not present

## 2022-11-11 NOTE — Therapy (Signed)
OUTPATIENT PHYSICAL THERAPY TREATMENT NOTE & RE-EVALUATION / RECERTIFICATION  Patient Name: Jeffrey Ayala MRN: [id number] DOB:1961/06/25, 61 y.o., male Today's Date: 11/11/2022  PCP: [name], NP-C REFERRING PROVIDER: [name], MD   END OF SESSION:   PT End of Session - 11/11/22 0801     Visit Number 20    Number of Visits 35    Date for PT Re-Evaluation 04/22/23    Authorization Type [employer] comm PPO    Authorization Time Period 20 visit limit PT/OT/chiro, $35 co-pay    Authorization - Visit Number 63    Authorization - Number of Visits 20    Progress Note Due on Visit 55    PT Start Time 0801    PT Stop Time 0846    PT Time Calculation (min) 45 min    Equipment Utilized During Treatment Gait belt    Activity Tolerance Patient tolerated treatment well;Patient limited by fatigue    Behavior During Therapy WFL for tasks assessed/performed                      Past Medical History:  Diagnosis Date   A-fib ([place])    Acute nonintractable headache 03/25/2022   Anemia    Arthritis    Atrial fibrillation, chronic ([name])    not on [facility]   Cardiac arrest ([place]) 02/2022   in the setting of septic shock   Cirrhosis of liver ([place])    Concussion    Dysrhythmia    afib   Eosinophilic esophagitis    EGD/bx by Dr. Havery Moros   ESRD needing dialysis [facility])    GERD (gastroesophageal reflux disease)    History of gastric ulcer    History of kidney stones    Intermittent dysphagia    Scrotal infection 02/2022   Septic shock ([place]) 02/2022   Vasoplegia - > L TMA and R BKA   Past Surgical History:  Procedure Laterality Date   AMPUTATION Right 03/15/2022   Procedure: RIGHT BELOW KNEE AMPUTATION;  Surgeon: [name], MD;  Location: [place];  Service: Orthopedics;  Laterality: Right;   AV FISTULA PLACEMENT Left 08/28/2022   Procedure: LEFT ARM ARTERIOVENOUS (AV) FISTULA CREATION;  Surgeon: Waynetta Sandy, MD;  Location: [place];  Service: Vascular;   Laterality: Left;   BIOPSY  10/26/2022   Procedure: BIOPSY;  Surgeon: Yetta Flock, MD;  [facility];  Service: Gastroenterology;;   COLONOSCOPY WITH PROPOFOL N/A 10/26/2022   Procedure: COLONOSCOPY WITH PROPOFOL;  Surgeon: Yetta Flock, MD;  [facility];  Service: Gastroenterology;  Laterality: N/A;   CYSTOSCOPY     CYSTOSCOPY WITH STENT PLACEMENT Bilateral 08/15/2022   Procedure: CYSTOSCOPY WITH STENT PLACEMENT;  Surgeon: Remi Haggard, MD;  Location: [place];  Service: Urology;  Laterality: Bilateral;   CYSTOSCOPY/URETEROSCOPY/HOLMIUM LASER/STENT PLACEMENT Bilateral 09/14/2022   Procedure: CYSTOSCOPY/URETEROSCOPY/HOLMIUM LASER/STENT PLACEMENT;  Surgeon: Raynelle Bring, MD;  [facility];  Service: Urology;  Laterality: Bilateral;   DENTAL SURGERY     ESOPHAGOGASTRODUODENOSCOPY (EGD) WITH PROPOFOL N/A 10/26/2022   Procedure: ESOPHAGOGASTRODUODENOSCOPY (EGD) WITH PROPOFOL;  Surgeon: Yetta Flock, MD;  [facility];  Service: Gastroenterology;  Laterality: N/A;   FINGER SURGERY     HERNIA REPAIR     at age 70   IR FLUORO GUIDE CV LINE RIGHT  03/09/2022   IR PATIENT EVAL TECH 0-60 MINS  03/09/2022   IR US GUIDE VASC ACCESS RIGHT  03/09/2022   KNEE ARTHROSCOPY  Left    LITHOTRIPSY     NEPHROLITHOTOMY     POLYPECTOMY  10/26/2022   Procedure: POLYPECTOMY;  Surgeon: Yetta Flock, MD;  Location[facility];  Service: Gastroenterology;;   Carolynn Serve EXPLORATION N/A 03/16/2022   Procedure: IRRIGATION AND DEBRIDEMENT SCROTUM;  Surgeon: Raynelle Bring, MD;  Location: [place];  Service: Urology;  Laterality: N/A;   SCROTAL EXPLORATION N/A 03/28/2022   Procedure: IRRIGATION AND DEBRIDEMENT SCROTUM;  Surgeon: Ardis Hughs, MD;  Location: [place];  Service: Urology;  Laterality: N/A;   TONSILLECTOMY     TOTAL KNEE ARTHROPLASTY Bilateral 11/04/2018   Procedure: BILATERAL TOTAL KNEE ARTHROPLASTY;  Surgeon: [name], MD;  Location: [place];   Service: Orthopedics;  Laterality: Bilateral;  spinal/epidural per anesthesiologist   TRANSMETATARSAL AMPUTATION Left 03/15/2022   Procedure: LEFT TRANSMETATARSAL AMPUTATION;  Surgeon: [name], MD;  Location: [place];  Service: Orthopedics;  Laterality: Left;   Patient Active Problem List   Diagnosis Date Noted   OSA (obstructive sleep apnea) 11/08/2022   Benign neoplasm of colon 10/26/2022   Gastric polyp 10/26/2022   Suspected sleep apnea 10/01/2022   Daytime somnolence 10/01/2022   Snoring 10/01/2022   Acute bilateral obstructive uropathy 08/15/2022   Skin breakdown 07/29/2022   Suicidal ideations 06/09/2022   Chills (without fever) 06/09/2022   Severe depression ([place]) 06/09/2022   Acute cough 06/09/2022   End stage renal disease ([place]) 06/02/2022   Reactive depression 05/31/2022   Belching 05/27/2022   Gastroesophageal reflux disease 05/27/2022   Atrial fibrillation ([place]) 05/20/2022   Long term (current) use of anticoagulants 05/20/2022   S/P transmetatarsal amputation of foot, left ([place]) 04/18/2022   CKD (chronic kidney disease) stage 5, GFR less than 15 ml/min ([place]) 04/18/2022   Secondary hyperparathyroidism of renal origin ([place]) 04/10/2022   Pruritus, unspecified 04/10/2022   Nausea 04/10/2022   Iron deficiency anemia, unspecified 04/10/2022   Hypothyroidism, unspecified 04/10/2022   Other acute kidney failure ([place]) 04/10/2022   Dyspnea, unspecified 09/24/1116   Eosinophilic esophagitis 35/67/0141   Gastric ulcer, unspecified as acute or chronic, without hemorrhage or perforation 04/10/2022   Coagulation defect, unspecified ([place]) 04/10/2022   Allergy, unspecified, initial encounter 04/10/2022   Anaphylactic shock, unspecified, initial encounter 04/10/2022   Arthralgia of right lower leg    Acute blood loss anemia    Unilateral complete BKA, right, initial encounter ([place]) 04/03/2022   S/P BKA (below knee amputation), right ([place]) 04/03/2022   Old myocardial  infarction 04/02/2022   Dependence on renal dialysis ([place]) 04/02/2022   Acquired absence of right leg below knee ([place]) 04/02/2022   Cirrhosis ([place]) 03/31/2022   Fournier's gangrene in male    Scrotal infection 03/25/2022   Anemia 03/25/2022   Diarrhea 03/25/2022   Shock liver 03/25/2022   Acute respiratory failure with hypoxia ([place]) 03/25/2022   Gout 03/25/2022   Acute nonintractable headache 03/25/2022   Demand ischemia 03/25/2022   Thrombocytopenia ([place]) 03/25/2022   Obesity, Class III, BMI 40-49.9 (morbid obesity) ([place]) 03/25/2022   Cardiac arrest ([place]) 03/25/2022   Hypotension    Respiratory insufficiency    Cellulitis    Malnutrition of moderate degree 03/12/2022   Altered mental status    Gangrene of right foot ([place])    Gangrene of left foot ([place])    Severe protein-calorie malnutrition ([place]) 03/10/2022   Colon cancer screening    Pressure injury of skin 02/23/2022   Septic shock ([place]) 02/22/2022   AKI (acute kidney injury) ([place])  Generalized abdominal pain    Lactic acidosis    Atrial fibrillation with RVR ([name])    Hypokalemia    Hypomagnesemia    Pain in finger of right hand 08/14/2021   Degenerative joint disease of shoulder region 05/07/2020   Olecranon bursitis of right elbow 05/07/2020   Pain in joint of right shoulder 04/26/2020   S/P TKR (total knee replacement), bilateral 11/04/2018   Bilateral primary osteoarthritis of knee    DDD (degenerative disc disease), cervical 01/10/2018   Rupture of medial head of gastrocnemius, left, sequela 01/14/2017   Kidney stones 06/22/2016   Essential hypertension 08/01/2010   DYSTHYMIC DISORDER 07/08/2010   PALPITATIONS 07/08/2010    REFERRING DIAG: Z89.511 (ICD-10-CM) - Hx of below knee amputation, right   ONSET DATE: 06/19/2022 Prosthesis delivery  THERAPY DIAG:  Other abnormalities of gait and mobility  Unsteadiness on feet  Muscle weakness (generalized)  Foot drop, left  Stiffness of left ankle, not  elsewhere classified  Pain in right lower leg  Rationale for Evaluation and Treatment Rehabilitation  PERTINENT HISTORY: Rt BKA 03/15/22, Left MTA 03/15/2022, bil. 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 Stretches to ankle done some but not as much as should. His ankle is a little better.   PAIN:  Are you having pain? Left Distal residual limb  0/10 with standing & gait  Right foot distal limb seated 0-5/10 and standing/walking "does not notice it." Focused on balance.   OBJECTIVE: (objective measures completed at initial evaluation unless otherwise dated)    COGNITION: 06/22/2022  Overall cognitive status: Within functional limits for tasks assessed and pt had flat affect so unable to fully assess cognition.   POSTURE: 06/22/2022  rounded shoulders, forward head, flexed trunk , and weight shift left   LOWER EXTREMITY ROM:   ROM P:passive  A:active Right Eval 06/22/2022 Left Eval 06/22/2022 Right 11/11/22 Left 11/11/22  Hip flexion        Hip extension Standing A: ~30* limitation Standing A: ~30* limitation Standing A: -15* Standing A: -5*  Hip abduction        Hip adduction        Hip internal rotation        Hip external rotation        Knee flexion        Knee extension Standing A: ~15* limitation Standing A: ~15* limitation Standing A: -5* Standing A: -2*  Ankle dorsiflexion   Standing A: ~25* limitation  Seated A: -18*  Ankle plantarflexion        Ankle inversion        Ankle eversion         (Blank rows = not tested)   LOWER EXTREMITY MMT:   MMT Right Eval 06/22/2022 Left Eval 06/22/2022 Right 11/11/22 Left  11/11/22  Hip flexion 4/5 4/5    Hip extension 2/5 functional 2/5 functional Standing 4/5 Standing 4/5  Hip abduction 2/5  functional 2/5 functional Standing  4/5 Standing 4/5  Hip adduction        Hip internal rotation        Hip external rotation        Knee flexion        Knee extension 3-/5 functional 3-/5 functional 5/5 5/5  Ankle dorsiflexion  2-/5  3-/5 Limited  by ROM  Ankle plantarflexion        Ankle inversion        Ankle eversion        (Blank rows = not tested)   TRANSFERS: 06/22/2022  Sit to stand: Mod A and from 22" w/c requiring armrests to push & RW to stabilize 06/22/2022  Stand to sit: Min A from RW to 22" w/c unable to release RW to reach for w/c until seated.   GAIT: 11/11/2022: Resting SpO2 99% HR 86 3 Minute Walk Test 402' with cane with SBA Gait Pattern: RLE abduction, decreased stance duration RLE with decreased step length LLE, step through pattern, flexed trunk, LLE foot drop After gait SpO2 99% HR 50 no symptoms of light-headed or ShOB  Gait Velocity 2.07 ft/sec with cane  Pt neg 12* ramp & 6" curb with cane & TTA prosthesis with CGA.  06/22/2022 Gait pattern: step to pattern, decreased step length- Left, decreased stance time- Right, decreased stride length, decreased hip/knee flexion- Right, circumduction- Right, Right hip hike, knee flexed in stance- Right, knee flexed in stance- Left, antalgic, trunk flexed, abducted- Right, and poor foot clearance- Left excessive weightbearing BUEs on RW Distance walked: 5' Assistive device utilized: Environmental consultant - 2 wheeled and right TTA prosthesis / left plate & shoe filler Level of assistance: Mod A Comments: post gait HR 82, SpO2 99% with dyspnea noted   FUNCTIONAL TESTs:  11/11/2022: Merrilee Jansky Balance 14/56 Timed Up & Go: with cane std 18.50 sec with CGA (time >13.5sec indicates high fall risk)  and  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 23.62sec with CGA (time >15sec and 27.7% increase from std / >10% increase indicates high fall risk when distracted)  [facility] PT Assessment - 11/11/22 0801       Standardized Balance Assessment    Standardized Balance Assessment Berg Balance Test;Timed Up and Go Test      Berg Balance Test   Sit to Stand Able to stand using hands after several tries    Standing Unsupported Able to stand 30 seconds unsupported    Sitting with Back Unsupported but Feet Supported on Floor or Stool Able to sit safely and securely 2 minutes    Stand to Sit Controls descent by using hands    Transfers Able to transfer safely, definite need of hands    Standing Unsupported with Eyes Closed Needs help to keep from falling    Standing Unsupported with Feet Together Needs help to attain position and unable to hold for 15 seconds    From Standing, Reach Forward with Outstretched Arm Loses balance while trying/requires external support    From Standing Position, Pick up Object from Floor Unable to try/needs assist to keep balance    From Standing Position, Turn to Look Behind Over each Shoulder Needs assist to keep from losing balance and falling    Turn 360 Degrees Needs assistance while turning    Standing Unsupported, Alternately Place Feet on Step/Stool Needs assistance to keep from falling or unable to try    Standing Unsupported, One Foot in [employer] balance while stepping or standing    Standing on One Leg Unable to try or needs assist to prevent fall    Total Score 14      Timed Up and Go Test   Normal TUG (seconds) 18.5   with cane CGA   Cognitive TUG (seconds) 23.62   with cane CGA 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     CURRENT PROSTHETIC WEAR ASSESSMENT: 11/11/2022: Pt verbalizes proper, independent prosthetic care including donning prosthesis with suction sleeve suspension, skin check, residual limb care, care of non-amputated limb, prosthetic cleaning, ply sock cleaning, correct ply sock adjustment, proper wear schedule/adjustment, and proper weight-bearing  schedule/adjustment. Pt reports wear >90% of awake hours with no skin issues or limb pain.    06/22/2022 Patient is dependent with: skin check, residual limb care, care of non-amputated limb, prosthetic cleaning, ply sock cleaning, correct ply sock adjustment, proper wear schedule/adjustment, and proper weight-bearing schedule/adjustment Donning prosthesis: Total A and max cues required, Doffing prosthesis: Total A and wife able to doffe Prosthetic wear tolerance: 1-2 hours 1-2x/day, for 3 of 3 days since delivery.  Prosthetic weight bearing tolerance: 0 minutes unable to stand outside of PT Edema: pitting with >5sec capillary refill Residual limb condition: 65m wound with serous drainage & black scab present,  calf measurement is 42.2cm Prosthetic description:  silicon liner with pin suspension, flexible inner socket Total Contact design, dynamic response foot with hydraulic PF/DF K code/activity level with prosthetic use: Level 3     TODAY'S TREATMENT:  11/11/2022: See objective data for ROM, strength, balance tests of Berg & TUG, gait & prosthetic use. Pt is scheduled for cardioversion next week. PT recommended advising cardiologist that his HR dropped with gait. After cardioversion PT recommended monitoring HR with activity to make sure that it increases slightly. Pt & wife verbalized understanding.   PT reviewed recommendation for building standing tolerance by using 24" bar stool for upright without back or arm support & limited support of BLEs intermittently with standing near counter or support surface.  Perform hourly as long as tolerates each hour of work day time. Pt & wife verbalized understanding. PT reviewed recommendation to build walking tolerance by short walks (room to room) with increasing frequency during awake hours, long walk (max tolerable) 1-2 x/day may use shopping cart or RW and medium distance (bw short & long) 5+ x/day.  Pt & wife verbalized understanding.  PT, pt  & wife discussed outcomes and decided on POC noted below. Both are in agreement in continuing PT with plan for PT to guide HEP / activities for home to make further progress. Pt verbalizes understanding that he will need to be compliant to make gains.   11/04/2022: Therapeutic Exercise:  PT instructed in stretches for Gastroc & Soleus muscles 3 ways. See HEP below. PT discussed need / benefits related to left forefoot pain, lower risk of pressure wounds and balance. Pt return demo understanding of HEP and pt & wife verbalize understanding & importance.   Prosthetic Training with TTA prosthesis: PT explained difference in residual limb pain (both right TTA & left partial foot), phantom sensation and phantom pain.  PT recommending that he get new shrinkers to control edema at night but should be measured to ensure proper size. Pt verbalized understanding.  PT demo & verbal cues on sit/stand - 1) from 18" chair with armrests to quick light touch on chair back anteriorly  2)progressed to using seat of 18" chair  3)progressed to rolling desk chair ~2" in front of wall for safety initially with armrests 4)rolling desk chair ~2" in front of wall for safety using seat of chair  pt & wife verbalize understanding as HEP.   10/28/2022: Prosthetic Training with TTA prosthesis: PT instructed in residual  limb pain vs phantom sensation vs phantom pain. Pt & wife verbalized understanding.  PT demo & verbal cues on rolling tennis seated. Pt return demo understanding.  PT reviewed with demo proper sit to stand to facilitate weight shifting over prosthesis sooner and initiating gait with less hesitancy.  Patient improved with repetition with supervision for safety. Upon patient request PT demo and instructed patient and returning to driving with the right lower extremity amputation.  PT discussed practicing and open large parking lot to improve foot work.  Patient and wife verbalized understanding. PT demo pelvic  orientation for turning.  Patient able to ambulate weaving through cones about 6 feet apart.  Patient improved with instruction and repetition.  Worked on negotiating around furniture with turns up to 90 degrees but she improved again with repetition and instruction. Patient and wife verbalized understanding of working on above activities at home.   HOME EXERCISE PROGRAM: Access Code: [email or web address] URL: [email or web address] Date: 11/04/2022 Prepared by: Jamey Reas  Exercises - Standing Gastroc Stretch at Counter  - 1-3 x daily - 7 x weekly - 1 sets - 2-3 reps - 20-30 seconds hold - Standing Ankle Dorsiflexion Stretch  - 1-3 x daily - 7 x weekly - 1 sets - 2-3 reps - 20-30 seconds hold - Gastroc Stretch on Step  - 1-3 x daily - 7 x weekly - 1 sets - 2-3 reps - 20-30 seconds hold - Standing Soleus Stretch on Step with Counter Support  - 1-3 x daily - 7 x weekly - 1 sets - 2-3 reps - 20-30 seconds hold - Seated Calf Stretch with Strap  - 1-3 x daily - 7 x weekly - 1 sets - 2-3 reps - 20-30 seconds hold - Seated Soleus Stretch with Strap  - 1-3 x daily - 7 x weekly - 1 sets - 2-3 reps - 20-30 seconds hold   AT [place].  Try to find this position when standing still for activities.   USE TAPE ON FLOOR TO MARK THE MIDLINE POSITION which is even with middle of sink.  You also should try to feel with your limb pressure in socket.  You are trying to feel with limb what you used to feel with the bottom of your foot.  Side to Side Shift: Moving your hips only (not shoulders): move weight onto your left leg, HOLD/FEEL pressure in socket.  Move back to equal weight on each leg, HOLD/FEEL pressure in socket. Move weight onto your right leg, HOLD/FEEL pressure in socket. Move back to equal weight on each leg, HOLD/FEEL pressure in socket. Repeat.  Start with both hands on sink, progress to hand on prosthetic side only, then  no hands.  Front to Back Shift: Moving your hips only (not shoulders): move your weight forward onto your toes, HOLD/FEEL pressure in socket. Move your weight back to equal Flat Foot on both legs, HOLD/FEEL  pressure in socket. Move your weight back onto your heels, HOLD/FEEL  pressure in socket. Move your weight back to equal on both legs, HOLD/FEEL  pressure in socket. Repeat.  Start with both hands on sink, progress to hand on prosthetic side only, then no hands.  Moving Cones / Cups: With equal weight on each leg: Hold on with one hand the first time, then progress to no hand supports. Move cups from one side of sink to the other. Place cups ~2" out of your reach, progress to  10" beyond reach.  Place one hand in middle of sink and reach with other hand. Do both arms.  Then hover one hand and move cups with other hand.  Overhead/Upward Reaching: alternated reaching up to top cabinets or ceiling if no cabinets present. Keep equal weight on each leg. Start with one hand support on counter while other hand reaches and progress to no hand support with reaching.  ace one hand in middle of sink and reach with other hand. Do both arms.  Then hover one hand and move cups with other hand.  5.   Looking Over Shoulders: With equal weight on each leg: alternate turning to look over your shoulders with one hand support on counter as needed.  Start with head motions only to look in front of shoulder, then even with shoulder and progress to looking behind you. To look to side, move head /eyes, then shoulder on side looking pulls back, shift more weight to side looking and pull hip back. Place one hand in middle of sink and let go with other hand so your shoulder can pull back. Switch hands to look other way.   Then hover one hand and look over shoulder. If looking right, use left hand at sink. If looking left, use right hand at sink. 6.  Stepping with leg that is not amputated:  Move items under cabinet out of your way.  Shift your hips/pelvis so weight on prosthesis. Tighten muscles in hip on prosthetic side.  SLOWLY step other leg so front of foot is in cabinet. Then step back to floor.   Pt also recommending sitting at edge of w/c with feet supported on floor but no back support each hour in w/c until back fatigues. PT also demo & instructed in sitting on 24" stool. Pt & wife verbalized understanding.     ASSESSMENT:   CLINICAL IMPRESSION: Patient has made significant progress with PT in that he is no longer w/c bound, wears & uses prosthesis most of awake hours and no longer requires BUE support for mobility.  Berg Balance & Timed Up/Go Tests indicate high fall risk.  At Initial Evaluation for PT he could not stand to enable either of these tests to be conducted.  Pt has limitations in range, strength, balance & gait that impair his mobility. They have all improved since initial evaluation significantly but continue to place him at risk of falls. Pt would benefit from further skilled PT to improve his mobility & safety more. Pt & wife are in agreement.    OBJECTIVE IMPAIRMENTS Abnormal gait, cardiopulmonary status limiting activity, decreased activity tolerance, decreased balance, decreased endurance, decreased knowledge of condition, decreased knowledge of use of DME, decreased mobility, difficulty walking, decreased ROM, decreased strength, increased edema, impaired flexibility, postural dysfunction, and prosthetic dependency .    ACTIVITY LIMITATIONS carrying, lifting, bending, sitting, standing, squatting, stairs, transfers, locomotion level, and prosthesis use   PARTICIPATION LIMITATIONS: driving, community activity, occupation, yard work, and recreational of camping   [place], Past/current experiences, Time since onset of injury/illness/exacerbation, and 3+ comorbidities: see PMH  are also affecting patient's functional outcome.    REHAB POTENTIAL: Good   CLINICAL DECISION MAKING:  Evolving/moderate complexity   EVALUATION COMPLEXITY: Moderate     GOALS: Goals reviewed with patient? Yes   SHORT TERM GOALS: Target date: 4 visits after re-eval (visit number 24)   Timed Up & Go with cane <15 sec.  Goal status: Set 11/11/2022 2.  Patient demo & verbalizes  understanding of updated HEP.  Goal status: Set 11/11/2022   3.  pt amb >500' with cane & prosthesis in 3 minutes with no LOB. Goal status: Set 11/11/2022      UPDATED LONG TERM GOALS: Target date: 11/19/2022   Patient demonstrates & verbalized understanding of prosthetic care to enable safe utilization of prosthesis. Goal status: MET 11/11/2022  2.   Patient tolerates prosthesis wear >90% of awake hours without skin or limb pain issues. Goal status: MET 11/11/2022   3.   Berg Balance >/= 36/56 to indicate lower fall risk Goal status: Not MET 11/11/2022   4.   Patient ambulates >300' with prosthesis & LRAD independently Goal status:  MET 11/11/2022 with RW & partially met with cane   5.   Patient negotiates ramps, curbs & stairs with single rail with prosthesis & LRAD independently. Goal status: MET 11/11/2022 with RW & ongoing with cane    UPDATED LONG TERM GOALS: Target date: 04/22/2023   Patient demonstrates & verbalized understanding of ongoing HEP / fitness plan. Goal status: set 11/11/2022  2.   Patient Timed Up & Go <13.5 sec standard & <15sec cognitive Goal status: set 11/11/2022   3.   Berg Balance >/= 36/56 to indicate lower fall risk Goal status: Ongoing 11/11/2022   4.   Patient ambulates >500' with prosthesis & cane or less independently Goal status:Ongoing 08/24/2022   5.   Patient negotiates ramps, curbs & stairs with single rail with prosthesis & cane or less independently. Goal status: Ongoing 08/24/2022     PLAN: PT FREQUENCY: 1x every 1-2 weeks for 15 visits    PT DURATION: 15 visits over 5 months   PLANNED INTERVENTIONS: Therapeutic exercises, Therapeutic activity,  Neuromuscular re-education, Balance training, Gait training, Patient/Family education, Self Care, Stair training, Vestibular training, Canalith repositioning, Prosthetic training, DME instructions, Vasopneumatic device, and physical performance testing   PLAN FOR NEXT SESSION:   update HEP for balance in corner, check HR response to activity   Jamey Reas, PT, DPT 11/11/2022, 9:27 AM

## 2022-11-17 ENCOUNTER — Ambulatory Visit (HOSPITAL_COMMUNITY)
Admission: RE | Admit: 2022-11-17 | Discharge: 2022-11-17 | Disposition: A | Discharge status: Home / Self Care | Payer: BC Managed Care – PPO | Source: Ambulatory Visit | Attending: Nephrology | Admitting: Nephrology

## 2022-11-17 VITALS — BP 118/77 | HR 85 | Temp 97.1°F | Resp 18

## 2022-11-17 DIAGNOSIS — D631 Anemia in chronic kidney disease: Secondary | ICD-10-CM | POA: Diagnosis not present

## 2022-11-17 DIAGNOSIS — N185 Chronic kidney disease, stage 5: Secondary | ICD-10-CM | POA: Insufficient documentation

## 2022-11-17 DIAGNOSIS — D509 Iron deficiency anemia, unspecified: Secondary | ICD-10-CM | POA: Diagnosis not present

## 2022-11-17 DIAGNOSIS — N186 End stage renal disease: Secondary | ICD-10-CM | POA: Insufficient documentation

## 2022-11-17 LAB — CBC WITH DIFFERENTIAL/PLATELET
Abs Immature Granulocytes: 0.08 10*3/uL — ABNORMAL HIGH (ref 0.00–0.07)
Basophils Absolute: 0 10*3/uL (ref 0.0–0.1)
Basophils Relative: 0 %
Eosinophils Absolute: 0 10*3/uL (ref 0.0–0.5)
Eosinophils Relative: 0 %
HCT: 34.8 % — ABNORMAL LOW (ref 39.0–52.0)
Hemoglobin: 11.7 g/dL — ABNORMAL LOW (ref 13.0–17.0)
Immature Granulocytes: 1 %
Lymphocytes Relative: 14 %
Lymphs Abs: 1.1 10*3/uL (ref 0.7–4.0)
MCH: 32.9 pg (ref 26.0–34.0)
MCHC: 33.6 g/dL (ref 30.0–36.0)
MCV: 97.8 fL (ref 80.0–100.0)
Monocytes Absolute: 0.4 10*3/uL (ref 0.1–1.0)
Monocytes Relative: 6 %
Neutro Abs: 6 10*3/uL (ref 1.7–7.7)
Neutrophils Relative %: 79 %
Platelets: 178 10*3/uL (ref 150–400)
RBC: 3.56 MIL/uL — ABNORMAL LOW (ref 4.22–5.81)
RDW: 15.1 % (ref 11.5–15.5)
WBC: 7.6 10*3/uL (ref 4.0–10.5)
nRBC: 0 % (ref 0.0–0.2)

## 2022-11-17 LAB — RENAL FUNCTION PANEL
Albumin: 4 g/dL (ref 3.5–5.0)
Anion gap: 14 (ref 5–15)
BUN: 26 mg/dL — ABNORMAL HIGH (ref 8–23)
CO2: 25 mmol/L (ref 22–32)
Calcium: 9.1 mg/dL (ref 8.9–10.3)
Chloride: 102 mmol/L (ref 98–111)
Creatinine, Ser: 2.91 mg/dL — ABNORMAL HIGH (ref 0.61–1.24)
GFR, Estimated: 24 mL/min — ABNORMAL LOW (ref >60)
Glucose, Bld: 138 mg/dL — ABNORMAL HIGH (ref 70–99)
Phosphorus: 3.1 mg/dL (ref 2.5–4.6)
Potassium: 2.9 mmol/L — ABNORMAL LOW (ref 3.5–5.1)
Sodium: 141 mmol/L (ref 135–145)

## 2022-11-17 LAB — POCT HEMOGLOBIN-HEMACUE: Hemoglobin: 11.5 g/dL — ABNORMAL LOW (ref 13.0–17.0)

## 2022-11-17 MED ORDER — EPOETIN ALFA-EPBX 10000 UNIT/ML IJ SOLN
INTRAMUSCULAR | Status: AC
  Administered 2022-11-17: 20000 [IU] via SUBCUTANEOUS
  Filled 2022-11-17: qty 2

## 2022-11-17 MED ORDER — EPOETIN ALFA-EPBX 10000 UNIT/ML IJ SOLN: 20000.0000 [IU] | INTRAMUSCULAR | Status: DC

## 2022-11-19 DIAGNOSIS — N1832 Chronic kidney disease, stage 3b: Secondary | ICD-10-CM | POA: Diagnosis not present

## 2022-11-20 ENCOUNTER — Ambulatory Visit (HOSPITAL_COMMUNITY): Payer: BC Managed Care – PPO | Admitting: Anesthesiology

## 2022-11-20 ENCOUNTER — Other Ambulatory Visit: Payer: Self-pay

## 2022-11-20 ENCOUNTER — Ambulatory Visit (HOSPITAL_COMMUNITY)
Admission: RE | Admit: 2022-11-20 | Discharge: 2022-11-20 | Disposition: A | Discharge status: Home / Self Care | Payer: BC Managed Care – PPO | Attending: Cardiology | Admitting: Cardiology

## 2022-11-20 ENCOUNTER — Encounter (HOSPITAL_COMMUNITY): Payer: Self-pay | Admitting: Cardiology

## 2022-11-20 ENCOUNTER — Encounter (HOSPITAL_COMMUNITY)
Admission: RE | Disposition: A | Discharge status: Home / Self Care | Payer: Self-pay | Source: Home / Self Care | Attending: Cardiology

## 2022-11-20 DIAGNOSIS — K219 Gastro-esophageal reflux disease without esophagitis: Secondary | ICD-10-CM | POA: Insufficient documentation

## 2022-11-20 DIAGNOSIS — Z79899 Other long term (current) drug therapy: Secondary | ICD-10-CM | POA: Diagnosis not present

## 2022-11-20 DIAGNOSIS — I4819 Other persistent atrial fibrillation: Secondary | ICD-10-CM | POA: Insufficient documentation

## 2022-11-20 DIAGNOSIS — I120 Hypertensive chronic kidney disease with stage 5 chronic kidney disease or end stage renal disease: Secondary | ICD-10-CM | POA: Diagnosis not present

## 2022-11-20 DIAGNOSIS — N189 Chronic kidney disease, unspecified: Secondary | ICD-10-CM | POA: Diagnosis not present

## 2022-11-20 DIAGNOSIS — E039 Hypothyroidism, unspecified: Secondary | ICD-10-CM | POA: Diagnosis not present

## 2022-11-20 DIAGNOSIS — D649 Anemia, unspecified: Secondary | ICD-10-CM | POA: Insufficient documentation

## 2022-11-20 DIAGNOSIS — G4730 Sleep apnea, unspecified: Secondary | ICD-10-CM | POA: Insufficient documentation

## 2022-11-20 DIAGNOSIS — Z992 Dependence on renal dialysis: Secondary | ICD-10-CM | POA: Insufficient documentation

## 2022-11-20 DIAGNOSIS — N186 End stage renal disease: Secondary | ICD-10-CM | POA: Diagnosis not present

## 2022-11-20 DIAGNOSIS — I4891 Unspecified atrial fibrillation: Secondary | ICD-10-CM

## 2022-11-20 DIAGNOSIS — Z8711 Personal history of peptic ulcer disease: Secondary | ICD-10-CM | POA: Insufficient documentation

## 2022-11-20 DIAGNOSIS — Z8674 Personal history of sudden cardiac arrest: Secondary | ICD-10-CM | POA: Insufficient documentation

## 2022-11-20 DIAGNOSIS — D631 Anemia in chronic kidney disease: Secondary | ICD-10-CM | POA: Diagnosis not present

## 2022-11-20 DIAGNOSIS — I129 Hypertensive chronic kidney disease with stage 1 through stage 4 chronic kidney disease, or unspecified chronic kidney disease: Secondary | ICD-10-CM | POA: Diagnosis not present

## 2022-11-20 DIAGNOSIS — Z89511 Acquired absence of right leg below knee: Secondary | ICD-10-CM | POA: Insufficient documentation

## 2022-11-20 DIAGNOSIS — K7460 Unspecified cirrhosis of liver: Secondary | ICD-10-CM | POA: Diagnosis not present

## 2022-11-20 HISTORY — PX: CARDIOVERSION: SHX1299

## 2022-11-20 LAB — POCT I-STAT, CHEM 8
BUN: 24 mg/dL — ABNORMAL HIGH (ref 8–23)
Calcium, Ion: 1.19 mmol/L (ref 1.15–1.40)
Chloride: 103 mmol/L (ref 98–111)
Creatinine, Ser: 3.2 mg/dL — ABNORMAL HIGH (ref 0.61–1.24)
Glucose, Bld: 123 mg/dL — ABNORMAL HIGH (ref 70–99)
HCT: 33 % — ABNORMAL LOW (ref 39.0–52.0)
Hemoglobin: 11.2 g/dL — ABNORMAL LOW (ref 13.0–17.0)
Potassium: 2.9 mmol/L — ABNORMAL LOW (ref 3.5–5.1)
Sodium: 142 mmol/L (ref 135–145)
TCO2: 28 mmol/L (ref 22–32)

## 2022-11-20 SURGERY — CARDIOVERSION
Anesthesia: General

## 2022-11-20 MED ORDER — PROPOFOL 10 MG/ML IV BOLUS
INTRAVENOUS | Status: DC | PRN
  Administered 2022-11-20: 60 mg via INTRAVENOUS

## 2022-11-20 MED ORDER — POTASSIUM CHLORIDE CRYS ER 20 MEQ PO TBCR
40.0000 meq | EXTENDED_RELEASE_TABLET | Freq: Once | ORAL | Status: AC
  Administered 2022-11-20: 40 meq via ORAL
  Filled 2022-11-20: qty 2

## 2022-11-20 MED ORDER — LIDOCAINE 2% (20 MG/ML) 5 ML SYRINGE
INTRAMUSCULAR | Status: DC | PRN
  Administered 2022-11-20: 40 mg via INTRAVENOUS

## 2022-11-20 MED ORDER — SODIUM CHLORIDE 0.9 % IV SOLN: INTRAVENOUS | Status: DC

## 2022-11-20 NOTE — CV Procedure (Signed)
Procedure: Electrical Cardioversion Indications:  Atrial Fibrillation  Procedure Details:  Consent: Risks of procedure as well as the alternatives and risks of each were explained to the (patient/caregiver).  Consent for procedure obtained.  Time Out: Verified patient identification, verified procedure, site/side was marked, verified correct patient position, special equipment/implants available, medications/allergies/relevent history reviewed, required imaging and test results available. PERFORMED.  Patient placed on cardiac monitor, pulse oximetry, supplemental oxygen as necessary.  Sedation given:  Propofol '60mg'$ ; lidocaine '40mg'$  Pacer pads placed anterior and posterior chest.  Cardioverted 2 time(s).  Cardioversion with synchronized biphasic 200J shock.  Evaluation: Findings: Post procedure EKG shows: NSR with PAC Complications: None Patient did tolerate procedure well.  Time Spent Directly with the Patient:  46mnutes   HFreada Bergeron12/29/2023, 10:13 AM

## 2022-11-20 NOTE — Discharge Instructions (Signed)

## 2022-11-20 NOTE — Anesthesia Procedure Notes (Signed)
Procedure Name: General with mask airway Date/Time: 11/20/2022 10:07 AM  Performed by: Everlean Cherry A, CRNAPre-anesthesia Checklist: Patient identified, Emergency Drugs available, Suction available and Patient being monitored Patient Re-evaluated:Patient Re-evaluated prior to induction Oxygen Delivery Method: Ambu bag Preoxygenation: Pre-oxygenation with 100% oxygen Induction Type: IV induction Placement Confirmation: positive ETCO2 Dental Injury: Teeth and Oropharynx as per pre-operative assessment

## 2022-11-20 NOTE — Anesthesia Preprocedure Evaluation (Addendum)
Anesthesia Evaluation  Patient identified by MRN, date of birth, ID band Patient awake    Reviewed: Allergy & Precautions, NPO status , Patient's Chart, lab work & pertinent test results, reviewed documented beta blocker date and time   History of Anesthesia Complications Negative for: history of anesthetic complications  Airway Mallampati: III  TM Distance: >3 FB Neck ROM: Full    Dental  (+) Dental Advisory Given, Teeth Intact   Pulmonary sleep apnea    Pulmonary exam normal        Cardiovascular hypertension, Pt. on home beta blockers and Pt. on medications Normal cardiovascular exam+ dysrhythmias Atrial Fibrillation    Cardiac arrest 02/2022 - from septic shock  '23 TTE - EF 60 to 65%. There is mild left ventricular hypertrophy. No valvulopathy.     Neuro/Psych  Headaches PSYCHIATRIC DISORDERS  Depression       GI/Hepatic PUD,GERD  Medicated and Controlled,,(+) Cirrhosis         Endo/Other  Hypothyroidism   K 2.9   Renal/GU CRFRenal disease Briefly on dialysis after septic shock      Musculoskeletal  (+) Arthritis ,    Abdominal   Peds  Hematology  (+) Blood dyscrasia, anemia  On eliquis    Anesthesia Other Findings   Reproductive/Obstetrics                              Anesthesia Physical Anesthesia Plan  ASA: 4  Anesthesia Plan: General   Post-op Pain Management:    Induction: Intravenous  PONV Risk Score and Plan: 2 and Treatment may vary due to age or medical condition and Propofol infusion  Airway Management Planned: Natural Airway and Mask  Additional Equipment: None  Intra-op Plan:   Post-operative Plan:   Informed Consent: I have reviewed the patients History and Physical, chart, labs and discussed the procedure including the risks, benefits and alternatives for the proposed anesthesia with the patient or authorized representative who has indicated  his/her understanding and acceptance.       Plan Discussed with: CRNA and Anesthesiologist  Anesthesia Plan Comments:          Anesthesia Quick Evaluation

## 2022-11-20 NOTE — Anesthesia Postprocedure Evaluation (Signed)
Anesthesia Post Note  Patient: Jeffrey Ayala  Procedure(s) Performed: CARDIOVERSION     Patient location during evaluation: PACU Anesthesia Type: General Level of consciousness: awake and alert Pain management: pain level controlled Vital Signs Assessment: post-procedure vital signs reviewed and stable Respiratory status: spontaneous breathing, nonlabored ventilation and respiratory function stable Cardiovascular status: stable and blood pressure returned to baseline Anesthetic complications: no   No notable events documented.  Last Vitals:  Vitals:   11/20/22 1040 11/20/22 1050  BP: (!) 145/90   Pulse: 98 96  Resp: 19 19  SpO2: 95% 100%    Last Pain:  Vitals:   11/20/22 1050  PainSc: 0-No pain                 Audry Pili

## 2022-11-20 NOTE — Transfer of Care (Signed)
Immediate Anesthesia Transfer of Care Note  Patient: Jeffrey Ayala  Procedure(s) Performed: CARDIOVERSION  Patient Location: Endoscopy Unit  Anesthesia Type:General  Level of Consciousness: drowsy  Airway & Oxygen Therapy: Patient Spontanous Breathing  Post-op Assessment: Report given to RN and Post -op Vital signs reviewed and stable  Post vital signs: Reviewed and stable  Last Vitals:  Vitals Value Taken Time  BP 147/91 1014 11/20/22  Temp    Pulse 101 1014 11/20/22  Resp 17 1014 11/20/22  SpO2 96 1014 11/20/22    Last Pain:  Vitals:   11/20/22 0856  PainSc: 0-No pain      Patients Stated Pain Goal: 3 (52/84/13 2440)  Complications: No notable events documented.

## 2022-11-20 NOTE — Interval H&P Note (Signed)
History and Physical Interval Note:  11/20/2022 9:54 AM  Jeffrey Ayala  has presented today for surgery, with the diagnosis of AFIB.  The various methods of treatment have been discussed with the patient and family. After consideration of risks, benefits and other options for treatment, the patient has consented to  Procedure(s): CARDIOVERSION (N/A) as a surgical intervention.  The patient's history has been reviewed, patient examined, no change in status, stable for surgery.  I have reviewed the patient's chart and labs.  Questions were answered to the patient's satisfaction.     Freada Bergeron

## 2022-11-22 ENCOUNTER — Encounter (HOSPITAL_COMMUNITY): Payer: Self-pay | Admitting: Cardiology

## 2022-11-25 ENCOUNTER — Ambulatory Visit (INDEPENDENT_AMBULATORY_CARE_PROVIDER_SITE_OTHER): Payer: BC Managed Care – PPO | Admitting: Physical Therapy

## 2022-11-25 ENCOUNTER — Encounter: Payer: Self-pay | Admitting: Physical Therapy

## 2022-11-25 DIAGNOSIS — M25672 Stiffness of left ankle, not elsewhere classified: Secondary | ICD-10-CM

## 2022-11-25 DIAGNOSIS — R2689 Other abnormalities of gait and mobility: Secondary | ICD-10-CM | POA: Diagnosis not present

## 2022-11-25 DIAGNOSIS — M21372 Foot drop, left foot: Secondary | ICD-10-CM | POA: Diagnosis not present

## 2022-11-25 DIAGNOSIS — M79661 Pain in right lower leg: Secondary | ICD-10-CM

## 2022-11-25 DIAGNOSIS — M6281 Muscle weakness (generalized): Secondary | ICD-10-CM

## 2022-11-25 DIAGNOSIS — R2681 Unsteadiness on feet: Secondary | ICD-10-CM

## 2022-11-25 NOTE — Therapy (Signed)
OUTPATIENT PHYSICAL THERAPY TREATMENT NOTE   Patient Name: Jeffrey Ayala MRN: [id number] DOB:10/06/1961, 62 y.o., male Today's Date: 11/25/2022  PCP: [name], NP-C REFERRING PROVIDER: [name], MD   END OF SESSION:   PT End of Session - 11/25/22 0807     Visit Number 21    Number of Visits 35    Date for PT Re-Evaluation 04/22/23    Authorization Type [employer] comm PPO    Authorization Time Period 20 visit limit PT/OT/chiro, $35 co-pay    Authorization - Number of Visits 20    Progress Note Due on Visit 91    PT Start Time 0801    PT Stop Time 0846    PT Time Calculation (min) 45 min    Equipment Utilized During Treatment Gait belt    Activity Tolerance Patient tolerated treatment well;Patient limited by fatigue    Behavior During Therapy WFL for tasks assessed/performed                       Past Medical History:  Diagnosis Date   A-fib ([place])    Acute nonintractable headache 03/25/2022   Anemia    Arthritis    Atrial fibrillation, chronic ([name])    not on [facility]   Cardiac arrest ([place]) 02/2022   in the setting of septic shock   Cirrhosis of liver ([place])    Concussion    Dysrhythmia    afib   Eosinophilic esophagitis    EGD/bx by Dr. Havery Moros   ESRD needing dialysis [facility])    GERD (gastroesophageal reflux disease)    History of gastric ulcer    History of kidney stones    Intermittent dysphagia    Scrotal infection 02/2022   Septic shock ([place]) 02/2022   Vasoplegia - > L TMA and R BKA   Past Surgical History:  Procedure Laterality Date   AMPUTATION Right 03/15/2022   Procedure: RIGHT BELOW KNEE AMPUTATION;  Surgeon: [name], MD;  Location: [place];  Service: Orthopedics;  Laterality: Right;   AV FISTULA PLACEMENT Left 08/28/2022   Procedure: LEFT ARM ARTERIOVENOUS (AV) FISTULA CREATION;  Surgeon: Waynetta Sandy, MD;  Location: [place];  Service: Vascular;  Laterality: Left;   BIOPSY  10/26/2022   Procedure: BIOPSY;  Surgeon:  Yetta Flock, MD;  [facility];  Service: Gastroenterology;;   CARDIOVERSION N/A 11/20/2022   Procedure: CARDIOVERSION;  Surgeon: Freada Bergeron, MD;  Location: [facility] ENDOSCOPY;  Service: Cardiovascular;  Laterality: N/A;   COLONOSCOPY WITH PROPOFOL N/A 10/26/2022   Procedure: COLONOSCOPY WITH PROPOFOL;  Surgeon: Yetta Flock, MD;  [facility];  Service: Gastroenterology;  Laterality: N/A;   CYSTOSCOPY     CYSTOSCOPY WITH STENT PLACEMENT Bilateral 08/15/2022   Procedure: CYSTOSCOPY WITH STENT PLACEMENT;  Surgeon: Remi Haggard, MD;  Location: [place];  Service: Urology;  Laterality: Bilateral;   CYSTOSCOPY/URETEROSCOPY/HOLMIUM LASER/STENT PLACEMENT Bilateral 09/14/2022   Procedure: CYSTOSCOPY/URETEROSCOPY/HOLMIUM LASER/STENT PLACEMENT;  Surgeon: Raynelle Bring, MD;  [facility];  Service: Urology;  Laterality: Bilateral;   DENTAL SURGERY     ESOPHAGOGASTRODUODENOSCOPY (EGD) WITH PROPOFOL N/A 10/26/2022   Procedure: ESOPHAGOGASTRODUODENOSCOPY (EGD) WITH PROPOFOL;  Surgeon: Yetta Flock, MD;  [facility];  Service: Gastroenterology;  Laterality: N/A;   FINGER SURGERY     HERNIA REPAIR     at age 9   IR FLUORO GUIDE CV LINE RIGHT  03/09/2022   IR PATIENT EVAL TECH 0-60 MINS  03/09/2022   IR US GUIDE VASC ACCESS RIGHT  03/09/2022   KNEE ARTHROSCOPY Left    LITHOTRIPSY     NEPHROLITHOTOMY     POLYPECTOMY  10/26/2022   Procedure: POLYPECTOMY;  Surgeon: Yetta Flock, MD;  Location: [facility];  Service: Gastroenterology;;   Carolynn Serve EXPLORATION N/A 03/16/2022   Procedure: IRRIGATION AND DEBRIDEMENT SCROTUM;  Surgeon: Raynelle Bring, MD;  Location: [place];  Service: Urology;  Laterality: N/A;   SCROTAL EXPLORATION N/A 03/28/2022   Procedure: IRRIGATION AND DEBRIDEMENT SCROTUM;  Surgeon: Ardis Hughs, MD;  Location: [place];  Service: Urology;  Laterality: N/A;   TONSILLECTOMY     TOTAL KNEE ARTHROPLASTY Bilateral 11/04/2018    Procedure: BILATERAL TOTAL KNEE ARTHROPLASTY;  Surgeon: [name], MD;  Location: [place];  Service: Orthopedics;  Laterality: Bilateral;  spinal/epidural per anesthesiologist   TRANSMETATARSAL AMPUTATION Left 03/15/2022   Procedure: LEFT TRANSMETATARSAL AMPUTATION;  Surgeon: [name], MD;  Location: [place];  Service: Orthopedics;  Laterality: Left;   Patient Active Problem List   Diagnosis Date Noted   OSA (obstructive sleep apnea) 11/08/2022   Benign neoplasm of colon 10/26/2022   Gastric polyp 10/26/2022   Suspected sleep apnea 10/01/2022   Daytime somnolence 10/01/2022   Snoring 10/01/2022   Acute bilateral obstructive uropathy 08/15/2022   Skin breakdown 07/29/2022   Suicidal ideations 06/09/2022   Chills (without fever) 06/09/2022   Severe depression ([place]) 06/09/2022   Acute cough 06/09/2022   End stage renal disease ([place]) 06/02/2022   Reactive depression 05/31/2022   Belching 05/27/2022   Gastroesophageal reflux disease 05/27/2022   Atrial fibrillation ([name]) 05/20/2022   Long term (current) use of anticoagulants 05/20/2022   S/P transmetatarsal amputation of foot, left ([place]) 04/18/2022   CKD (chronic kidney disease) stage 5, GFR less than 15 ml/min ([place]) 04/18/2022   Secondary hyperparathyroidism of renal origin ([place]) 04/10/2022   Pruritus, unspecified 04/10/2022   Nausea 04/10/2022   Iron deficiency anemia, unspecified 04/10/2022   Hypothyroidism, unspecified 04/10/2022   Other acute kidney failure ([place]) 04/10/2022   Dyspnea, unspecified 03/83/3383   Eosinophilic esophagitis 29/19/1660   Gastric ulcer, unspecified as acute or chronic, without hemorrhage or perforation 04/10/2022   Coagulation defect, unspecified ([place]) 04/10/2022   Allergy, unspecified, initial encounter 04/10/2022   Anaphylactic shock, unspecified, initial encounter 04/10/2022   Arthralgia of right lower leg    Acute blood loss anemia    Unilateral complete BKA, right, initial encounter ([place])  04/03/2022   S/P BKA (below knee amputation), right ([place]) 04/03/2022   Old myocardial infarction 04/02/2022   Dependence on renal dialysis ([place]) 04/02/2022   Acquired absence of right leg below knee ([place]) 04/02/2022   Cirrhosis ([place]) 03/31/2022   Fournier's gangrene in male    Scrotal infection 03/25/2022   Anemia 03/25/2022   Diarrhea 03/25/2022   Shock liver 03/25/2022   Acute respiratory failure with hypoxia ([place]) 03/25/2022   Gout 03/25/2022   Acute nonintractable headache 03/25/2022   Demand ischemia 03/25/2022   Thrombocytopenia ([place]) 03/25/2022   Obesity, Class III, BMI 40-49.9 (morbid obesity) ([place]) 03/25/2022   Cardiac arrest ([place]) 03/25/2022   Hypotension    Respiratory insufficiency    Cellulitis    Malnutrition of moderate degree 03/12/2022   Altered mental status    Gangrene of right foot ([place])    Gangrene of left foot ([place])    Severe protein-calorie malnutrition ([place]) 03/10/2022   Colon cancer screening    Pressure injury of skin 02/23/2022  Septic shock ([place]) 02/22/2022   AKI (acute kidney injury) ([place])    Generalized abdominal pain    Lactic acidosis    Atrial fibrillation with RVR ([name])    Hypokalemia    Hypomagnesemia    Pain in finger of right hand 08/14/2021   Degenerative joint disease of shoulder region 05/07/2020   Olecranon bursitis of right elbow 05/07/2020   Pain in joint of right shoulder 04/26/2020   S/P TKR (total knee replacement), bilateral 11/04/2018   Bilateral primary osteoarthritis of knee    DDD (degenerative disc disease), cervical 01/10/2018   Rupture of medial head of gastrocnemius, left, sequela 01/14/2017   Kidney stones 06/22/2016   Essential hypertension 08/01/2010   DYSTHYMIC DISORDER 07/08/2010   PALPITATIONS 07/08/2010    REFERRING DIAG: Z89.511 (ICD-10-CM) - Hx of below knee amputation, right   ONSET DATE: 06/19/2022 Prosthesis delivery  THERAPY DIAG:  Other abnormalities of gait and mobility  Unsteadiness on  feet  Muscle weakness (generalized)  Foot drop, left  Stiffness of left ankle, not elsewhere classified  Pain in right lower leg  Rationale for Evaluation and Treatment Rehabilitation  PERTINENT HISTORY: Rt BKA 03/15/22, Left MTA 03/15/2022, bil. TKA 11/04/18, ESRD / chronic kidney disease st5 dialysis Tues/Thurs/Sat, cirrhosis, cardiac arrest May 23 post intubation, A-Fib  PRECAUTIONS: Fall, BP only in RUE starting 10/6  SUBJECTIVE:   Pt underwent cardioversion on 11/20/2022 for persistent A-Fib.  He has not restriction that he is aware of.  His right dominant shoulder has been hurting.   PAIN:  Are you having pain? Left Distal residual limb  0/10 with standing & gait  Right foot distal limb seated 0-2/10 and standing/walking "does not notice it." Focused on balance.   OBJECTIVE: (objective measures completed at initial evaluation unless otherwise dated)    COGNITION: 06/22/2022  Overall cognitive status: Within functional limits for tasks assessed and pt had flat affect so unable to fully assess cognition.   POSTURE: 06/22/2022  rounded shoulders, forward head, flexed trunk , and weight shift left   LOWER EXTREMITY ROM:   ROM P:passive  A:active Right Eval 06/22/2022 Left Eval 06/22/2022 Right 11/11/22 Left 11/11/22  Hip flexion        Hip extension Standing A: ~30* limitation Standing A: ~30* limitation Standing A: -15* Standing A: -5*  Hip abduction        Hip adduction        Hip internal rotation        Hip external rotation        Knee flexion        Knee extension Standing A: ~15* limitation Standing A: ~15* limitation Standing A: -5* Standing A: -2*  Ankle dorsiflexion   Standing A: ~25* limitation  Seated A: -18*  Ankle plantarflexion        Ankle inversion        Ankle eversion         (Blank rows = not tested)   LOWER EXTREMITY MMT:   MMT Right Eval 06/22/2022 Left Eval 06/22/2022 Right 11/11/22 Left  11/11/22  Hip flexion 4/5 4/5    Hip  extension 2/5 functional 2/5 functional Standing 4/5 Standing 4/5  Hip abduction 2/5 functional 2/5 functional Standing  4/5 Standing 4/5  Hip adduction        Hip internal rotation        Hip external rotation        Knee flexion        Knee extension 3-/5 functional  3-/5 functional 5/5 5/5  Ankle dorsiflexion  2-/5   3-/5 Limited  by ROM  Ankle plantarflexion        Ankle inversion        Ankle eversion        (Blank rows = not tested)   TRANSFERS: 06/22/2022  Sit to stand: Mod A and from 22" w/c requiring armrests to push & RW to stabilize 06/22/2022  Stand to sit: Min A from RW to 22" w/c unable to release RW to reach for w/c until seated.   GAIT: 11/11/2022: Resting SpO2 99% HR 86 3 Minute Walk Test 402' with cane with SBA Gait Pattern: RLE abduction, decreased stance duration RLE with decreased step length LLE, step through pattern, flexed trunk, LLE foot drop After gait SpO2 99% HR 50 no symptoms of light-headed or ShOB  Gait Velocity 2.07 ft/sec with cane  Pt neg 12* ramp & 6" curb with cane & TTA prosthesis with CGA.  06/22/2022 Gait pattern: step to pattern, decreased step length- Left, decreased stance time- Right, decreased stride length, decreased hip/knee flexion- Right, circumduction- Right, Right hip hike, knee flexed in stance- Right, knee flexed in stance- Left, antalgic, trunk flexed, abducted- Right, and poor foot clearance- Left excessive weightbearing BUEs on RW Distance walked: 5' Assistive device utilized: Environmental consultant - 2 wheeled and right TTA prosthesis / left plate & shoe filler Level of assistance: Mod A Comments: post gait HR 82, SpO2 99% with dyspnea noted   FUNCTIONAL TESTs:  11/11/2022: Merrilee Jansky Balance 14/56 Timed Up & Go: with cane std 18.50 sec with CGA (time >13.5sec indicates high fall risk)  and  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     CURRENT PROSTHETIC WEAR ASSESSMENT: 11/11/2022: Pt verbalizes proper, independent prosthetic care including donning prosthesis with suction sleeve suspension, skin check, residual limb care, care of non-amputated limb, prosthetic cleaning, ply sock cleaning, correct ply sock adjustment, proper wear schedule/adjustment, and proper weight-bearing schedule/adjustment. Pt reports wear >90% of awake hours with no skin issues or limb pain.    06/22/2022 Patient is dependent with: skin check, residual limb care, care of non-amputated limb, prosthetic cleaning, ply sock cleaning, correct ply sock adjustment, proper wear schedule/adjustment, and proper weight-bearing schedule/adjustment Donning prosthesis: Total A and max cues required, Doffing prosthesis: Total A and wife able to doffe Prosthetic wear tolerance: 1-2 hours 1-2x/day, for 3 of 3 days since delivery.  Prosthetic weight bearing tolerance: 0 minutes unable to stand outside of PT Edema: pitting with >5sec capillary refill Residual limb condition: 108m wound with serous drainage & black scab present,  calf measurement is 42.2cm Prosthetic description:  silicon liner with pin suspension, flexible inner socket Total Contact design, dynamic response foot with hydraulic PF/DF K code/activity level with prosthetic use: Level 3     TODAY'S TREATMENT:  11/25/2022: Neuromuscular Re-ed: Resting HR 97 SpO2 98% HEP in corner. PT demo, verbal & HO cues. Pt & wife verbalized understanding & pt return demo understanding.  Post activity HR 111 SpO2 98% Sitting on 24" bar stool with feet on floor upright without back support. PT demo & verbal cues on sit to stand from 24" bar stool without UE assist with table in front for intermittent touch. Pt performed  5 reps with minA / CGA for stabilization.  Pt & wife verbalized  understanding & pt return demo understanding for rationale of building tolerance for sitting to build endurance of trunk to upright without UE support and build strength/balance with sit to/from stand.  Therapeutic Exercise: PT instructed in head & shoulder posture related to shoulder function / pain & balance. PT instructed in chin tucks & scapula retraction supine & seated. PT demo, verbal & HO cues. Pt & wife verbalized understanding & pt return demo understanding.    11/11/2022: See objective data for ROM, strength, balance tests of Berg & TUG, gait & prosthetic use. Pt is scheduled for cardioversion next week. PT recommended advising cardiologist that his HR dropped with gait. After cardioversion PT recommended monitoring HR with activity to make sure that it increases slightly. Pt & wife verbalized understanding.   PT reviewed recommendation for building standing tolerance by using 24" bar stool for upright without back or arm support & limited support of BLEs intermittently with standing near counter or support surface.  Perform hourly as long as tolerates each hour of work day time. Pt & wife verbalized understanding. PT reviewed recommendation to build walking tolerance by short walks (room to room) with increasing frequency during awake hours, long walk (max tolerable) 1-2 x/day may use shopping cart or RW and medium distance (bw short & long) 5+ x/day.  Pt & wife verbalized understanding.  PT, pt & wife discussed outcomes and decided on POC noted below. Both are in agreement in continuing PT with plan for PT to guide HEP / activities for home to make further progress. Pt verbalizes understanding that he will need to be compliant to make gains.   11/04/2022: Therapeutic Exercise:  PT instructed in stretches for Gastroc & Soleus muscles 3 ways. See HEP below. PT discussed need / benefits related to left forefoot pain, lower risk of pressure wounds and balance. Pt return demo  understanding of HEP and pt & wife verbalize understanding & importance.   Prosthetic Training with TTA prosthesis: PT explained difference in residual limb pain (both right TTA & left partial foot), phantom sensation and phantom pain.  PT recommending that he get new shrinkers to control edema at night but should be measured to ensure proper size. Pt verbalized understanding.  PT demo & verbal cues on sit/stand - 1) from 18" chair with armrests to quick light touch on chair back anteriorly  2)progressed to using seat of 18" chair  3)progressed to rolling desk chair ~2" in front of wall for safety initially with armrests 4)rolling desk chair ~2" in front of wall for safety using seat of chair  pt & wife verbalize understanding as HEP.    HOME EXERCISE PROGRAM: Access Code: [email or web address] URL: [email or web address] Date: 11/25/2022 Prepared by: Jamey Reas  Exercises - Standing Gastroc Stretch at Counter  - 1-3 x daily - 7 x weekly - 1 sets - 2-3 reps - 20-30 seconds hold - Standing Ankle Dorsiflexion Stretch  - 1-3 x daily - 7 x weekly - 1 sets - 2-3 reps - 20-30 seconds hold - Gastroc Stretch on Step  - 1-3 x daily - 7 x weekly - 1 sets - 2-3 reps - 20-30 seconds hold - Standing Soleus Stretch on Step with Counter Support  - 1-3 x daily - 7 x weekly - 1 sets - 2-3 reps - 20-30 seconds hold - Seated Calf Stretch with Strap  - 1-3 x daily - 7 x weekly - 1  sets - 2-3 reps - 20-30 seconds hold - Seated Soleus Stretch with Strap  - 1-3 x daily - 7 x weekly - 1 sets - 2-3 reps - 20-30 seconds hold - wide stance head motions eyes open  - 1 x daily - 4 x weekly - 1 sets - 10 reps - 2 seconds hold - Wide Stance with Eyes Closed  - 1 x daily - 4 x weekly - 1 sets - 10 reps - 10 seconds hold - Feet Apart with Eyes Closed with Head Motions  - 1 x daily - 4 x weekly - 1 sets - 10 reps - 2 seconds hold - Wide stance on Foam Pad head movements  - 1 x daily - 4 x weekly - 1 sets - 10 reps - 2  seconds hold - Supine Cervical Retraction with Towel  - 1 x daily - 7 x weekly - 1 sets - 10 reps - 5 seconds hold - Cervical Retraction with Overpressure  - 1 x daily - 7 x weekly - 1 sets - 10 reps - 5 seconds hold - Supine Scapular Retraction  - 1 x daily - 7 x weekly - 1 sets - 10 reps - 5 seconds hold - Seated Scapular Retraction  - 1 x daily - 7 x weekly - 1 sets - 10 reps - 5 seconds hold   AT SINK FIND YOUR MIDLINE POSITION AND PLACE FEET EQUAL DISTANCE FROM THE MIDLINE.  Try to find this position when standing still for activities.   USE TAPE ON FLOOR TO MARK THE MIDLINE POSITION which is even with middle of sink.  You also should try to feel with your limb pressure in socket.  You are trying to feel with limb what you used to feel with the bottom of your foot.  Side to Side Shift: Moving your hips only (not shoulders): move weight onto your left leg, HOLD/FEEL pressure in socket.  Move back to equal weight on each leg, HOLD/FEEL pressure in socket. Move weight onto your right leg, HOLD/FEEL pressure in socket. Move back to equal weight on each leg, HOLD/FEEL pressure in socket. Repeat.  Start with both hands on sink, progress to hand on prosthetic side only, then no hands.  Front to Back Shift: Moving your hips only (not shoulders): move your weight forward onto your toes, HOLD/FEEL pressure in socket. Move your weight back to equal Flat Foot on both legs, HOLD/FEEL  pressure in socket. Move your weight back onto your heels, HOLD/FEEL  pressure in socket. Move your weight back to equal on both legs, HOLD/FEEL  pressure in socket. Repeat.  Start with both hands on sink, progress to hand on prosthetic side only, then no hands.  Moving Cones / Cups: With equal weight on each leg: Hold on with one hand the first time, then progress to no hand supports. Move cups from one side of sink to the other. Place cups ~2" out of your reach, progress to 10" beyond reach.  Place one hand in middle of sink  and reach with other hand. Do both arms.  Then hover one hand and move cups with other hand.  Overhead/Upward Reaching: alternated reaching up to top cabinets or ceiling if no cabinets present. Keep equal weight on each leg. Start with one hand support on counter while other hand reaches and progress to no hand support with reaching.  ace one hand in middle of sink and reach with other hand. Do both arms.  Then hover one hand and move cups with other hand.  5.   Looking Over Shoulders: With equal weight on each leg: alternate turning to look over your shoulders with one hand support on counter as needed.  Start with head motions only to look in front of shoulder, then even with shoulder and progress to looking behind you. To look to side, move head /eyes, then shoulder on side looking pulls back, shift more weight to side looking and pull hip back. Place one hand in middle of sink and let go with other hand so your shoulder can pull back. Switch hands to look other way.   Then hover one hand and look over shoulder. If looking right, use left hand at sink. If looking left, use right hand at sink. 6.  Stepping with leg that is not amputated:  Move items under cabinet out of your way. Shift your hips/pelvis so weight on prosthesis. Tighten muscles in hip on prosthetic side.  SLOWLY step other leg so front of foot is in cabinet. Then step back to floor.   Pt also recommending sitting at edge of w/c with feet supported on floor but no back support each hour in w/c until back fatigues. PT also demo & instructed in sitting on 24" stool. Pt & wife verbalized understanding.     ASSESSMENT:   CLINICAL IMPRESSION: Patient and wife appear to understand updated HEP in corner, sitting on bar stool and cervical / scapula.  Pt continues to benefit from skilled PT. HR had appropriate response to activity today.    OBJECTIVE IMPAIRMENTS Abnormal gait, cardiopulmonary status limiting activity, decreased activity  tolerance, decreased balance, decreased endurance, decreased knowledge of condition, decreased knowledge of use of DME, decreased mobility, difficulty walking, decreased ROM, decreased strength, increased edema, impaired flexibility, postural dysfunction, and prosthetic dependency .    ACTIVITY LIMITATIONS carrying, lifting, bending, sitting, standing, squatting, stairs, transfers, locomotion level, and prosthesis use   PARTICIPATION LIMITATIONS: driving, community activity, occupation, yard work, and recreational of camping   [place], Past/current experiences, Time since onset of injury/illness/exacerbation, and 3+ comorbidities: see PMH  are also affecting patient's functional outcome.    REHAB POTENTIAL: Good   CLINICAL DECISION MAKING: Evolving/moderate complexity   EVALUATION COMPLEXITY: Moderate     GOALS: Goals reviewed with patient? Yes   SHORT TERM GOALS: Target date: 4 visits after re-eval (visit number 24)   Timed Up & Go with cane <15 sec.  Goal status: Set 11/11/2022 2.  Patient demo & verbalizes understanding of updated HEP.  Goal status: Set 11/11/2022   3.  pt amb >500' with cane & prosthesis in 3 minutes with no LOB. Goal status: Set 11/11/2022      UPDATED LONG TERM GOALS: Target date: 11/19/2022   Patient demonstrates & verbalized understanding of prosthetic care to enable safe utilization of prosthesis. Goal status: MET 11/11/2022  2.   Patient tolerates prosthesis wear >90% of awake hours without skin or limb pain issues. Goal status: MET 11/11/2022   3.   Berg Balance >/= 36/56 to indicate lower fall risk Goal status: Not MET 11/11/2022   4.   Patient ambulates >300' with prosthesis & LRAD independently Goal status:  MET 11/11/2022 with RW & partially met with cane   5.   Patient negotiates ramps, curbs & stairs with single rail with prosthesis & LRAD independently. Goal status: MET 11/11/2022 with RW & ongoing with  cane    UPDATED LONG TERM GOALS: Target date:  04/22/2023   Patient demonstrates & verbalized understanding of ongoing HEP / fitness plan. Goal status: set 11/11/2022  2.   Patient Timed Up & Go <13.5 sec standard & <15sec cognitive Goal status: set 11/11/2022   3.   Berg Balance >/= 36/56 to indicate lower fall risk Goal status: Ongoing 11/11/2022   4.   Patient ambulates >500' with prosthesis & cane or less independently Goal status:Ongoing 08/24/2022   5.   Patient negotiates ramps, curbs & stairs with single rail with prosthesis & cane or less independently. Goal status: Ongoing 08/24/2022     PLAN: PT FREQUENCY: 1x every 1-2 weeks for 15 visits    PT DURATION: 15 visits over 5 months   PLANNED INTERVENTIONS: Therapeutic exercises, Therapeutic activity, Neuromuscular re-education, Balance training, Gait training, Patient/Family education, Self Care, Stair training, Vestibular training, Canalith repositioning, Prosthetic training, DME instructions, Vasopneumatic device, and physical performance testing   PLAN FOR NEXT SESSION:   check & update HEP for balance in corner, check HR response to activity   Jamey Reas, PT, DPT 11/25/2022, 9:28 AM

## 2022-11-27 DIAGNOSIS — N189 Chronic kidney disease, unspecified: Secondary | ICD-10-CM | POA: Diagnosis not present

## 2022-11-27 DIAGNOSIS — N179 Acute kidney failure, unspecified: Secondary | ICD-10-CM | POA: Diagnosis not present

## 2022-11-27 DIAGNOSIS — E876 Hypokalemia: Secondary | ICD-10-CM | POA: Diagnosis not present

## 2022-12-01 ENCOUNTER — Encounter (HOSPITAL_COMMUNITY): Payer: BC Managed Care – PPO

## 2022-12-01 DIAGNOSIS — D631 Anemia in chronic kidney disease: Secondary | ICD-10-CM | POA: Diagnosis not present

## 2022-12-01 DIAGNOSIS — N179 Acute kidney failure, unspecified: Secondary | ICD-10-CM | POA: Diagnosis not present

## 2022-12-01 DIAGNOSIS — N184 Chronic kidney disease, stage 4 (severe): Secondary | ICD-10-CM | POA: Diagnosis not present

## 2022-12-01 DIAGNOSIS — I129 Hypertensive chronic kidney disease with stage 1 through stage 4 chronic kidney disease, or unspecified chronic kidney disease: Secondary | ICD-10-CM | POA: Diagnosis not present

## 2022-12-01 NOTE — Progress Notes (Signed)
Cardiology Office Note:   Date:  12/04/2022  NAME:  Jeffrey Ayala    MRN: [id number] DOB:  July 31, 1961   PCP:  [name], NP-C  Cardiologist:  Evalina Field, MD  Electrophysiologist:  None   Referring MD: [name], NP-C   Chief Complaint  Patient presents with   Follow-up   History of Present Illness:   Jeffrey Ayala is a 62 y.o. male with a hx of persistent Afib, AKI, cirrhosis who presents for follow-up. S/p DCCV 11/20/2022.  Status post cardioversion.  Potassium was low.  Repeat labs through his nephrologist shows potassium is 3.8.  Hemoglobin is stable.  No significant bleeding on Eliquis.  He was diagnosed with severe sleep apnea.  I have encouraged him to follow-up on this.  This can lead to A-fib again.  He denies any chest pain or trouble breathing.  Heart rate in the 90s.  He will continue Eliquis and metoprolol.  Problem List Cardiac Arrest -2/2 septic shock 02/22/2022-04/03/2022 -scrotal infection  -2/2 troponin elevation, normal echo 2. AKI on HD -TTS perm cath  3. LE gangrene -s/p L transmetatarsal amputation, R BKA -2/2 pressors from shock  4. Paroxysmal Afib -CHADSVASC=2 (CAD, HTN)  5. Anemia -EGD/colonoscopy negative  6. Cirrhosis 7. OSA -severe with hypoxemia 70%  Past Medical History: Past Medical History:  Diagnosis Date   A-fib ([place])    Acute nonintractable headache 03/25/2022   Anemia    Arthritis    Atrial fibrillation, chronic ([name])    not on [facility]   Cardiac arrest ([place]) 02/2022   in the setting of septic shock   Cirrhosis of liver ([name])    Concussion    Dysrhythmia    afib   Eosinophilic esophagitis    EGD/bx by Dr. 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    Past Surgical History: Past Surgical  History:  Procedure Laterality Date   AMPUTATION Right 03/15/2022   Procedure: RIGHT BELOW KNEE AMPUTATION;  Surgeon: [name], MD;  Location: [place];  Service: Orthopedics;  Laterality: Right;   AV FISTULA PLACEMENT Left 08/28/2022   Procedure: LEFT ARM ARTERIOVENOUS (AV) FISTULA CREATION;  Surgeon: Waynetta Sandy, MD;  Location: [place];  Service: Vascular;  Laterality: Left;   BIOPSY  10/26/2022   Procedure: BIOPSY;  Surgeon: Yetta Flock, MD;  Location: [facility] ENDOS[facility];  Service: Gastroenterology;;   CARDIOVERSION N/A 11/20/2022   Procedure: CARDIOVERSION;  Surgeon: Freada Bergeron, MD;  Location: [facility] ENDOSCOPY;  Service: Cardiovascular;  Laterality: N/A;   COLONOSCOPY WITH PROPOFOL N/A 10/26/2022   Procedure: COLONOSCOPY WITH PROPOFOL;  Surgeon: Yetta Flock, MD;  Location: [facility]OS[facility];  Service: Gastroenterology;  Laterality: N/A;   CYSTOSCOPY     CYSTOSCOPY WITH STENT PLACEMENT Bilateral 08/15/2022   Procedure: CYSTOSCOPY WITH STENT PLACEMENT;  Surgeon: Remi Haggard, MD;  Location: [place];  Service: Urology;  Laterality: Bilateral;   CYSTOSCOPY/URETEROSCOPY/HOLMIUM LASER/STENT PLACEMENT Bilateral 09/14/2022   Procedure: CYSTOSCOPY/URETEROSCOPY/HOLMIUM LASER/STENT PLACEMENT;  Surgeon: Raynelle Bring, MD;  Location: [facility];  Service: Urology;  Laterality: Bilateral;   DENTAL SURGERY     ESOPHAGOGASTRODUODENOSCOPY (EGD) WITH PROPOFOL N/A 10/26/2022   Procedure: ESOPHAGOGASTRODUODENOSCOPY (EGD) WITH PROPOFOL;  Surgeon: Havery Moros,  Carlota Raspberry, MD;  Location: [facility];  Service: Gastroenterology;  Laterality: N/A;   FINGER SURGERY     HERNIA REPAIR     at age 27   IR FLUORO GUIDE CV LINE RIGHT  03/09/2022   IR PATIENT EVAL TECH 0-60 MINS  03/09/2022   IR US GUIDE VASC ACCESS RIGHT  03/09/2022   KNEE ARTHROSCOPY Left    LITHOTRIPSY     NEPHROLITHOTOMY     POLYPECTOMY  10/26/2022   Procedure: POLYPECTOMY;  Surgeon: Yetta Flock, MD;  Location: [facility];  Service: Gastroenterology;;   Carolynn Serve EXPLORATION N/A 03/16/2022   Procedure: IRRIGATION AND DEBRIDEMENT SCROTUM;  Surgeon: Raynelle Bring, MD;  Location: [place];  Service: Urology;  Laterality: N/A;   SCROTAL EXPLORATION N/A 03/28/2022   Procedure: IRRIGATION AND DEBRIDEMENT SCROTUM;  Surgeon: Ardis Hughs, MD;  Location: [place];  Service: Urology;  Laterality: N/A;   TONSILLECTOMY     TOTAL KNEE ARTHROPLASTY Bilateral 11/04/2018   Procedure: BILATERAL TOTAL KNEE ARTHROPLASTY;  Surgeon: [name], MD;  Location: [place];  Service: Orthopedics;  Laterality: Bilateral;  spinal/epidural per anesthesiologist   TRANSMETATARSAL AMPUTATION Left 03/15/2022   Procedure: LEFT TRANSMETATARSAL AMPUTATION;  Surgeon: [name], MD;  Location: [place];  Service: Orthopedics;  Laterality: Left;    Current Medications: Current Meds  Medication Sig   acetaminophen (TYLENOL) 500 MG tablet Take 1,000 mg by mouth every 6 (six) hours as needed for moderate pain or headache. Rapid Release   allopurinol (ZYLOPRIM) 100 MG tablet TAKE 1 TABLET BY MOUTH EVERY DAY   apixaban (ELIQUIS) 5 MG TABS tablet Take 1 tablet (5 mg total) by mouth 2 (two) times daily.   diphenhydramine-acetaminophen (TYLENOL PM) 25-500 MG TABS tablet Take 2 tablets by mouth at bedtime as needed (sleep).   furosemide (LASIX) 40 MG tablet Take 40 mg by mouth.   gabapentin (NEURONTIN) 100 MG capsule Take 1 capsule (100 mg total) by mouth 3 (three) times daily.   metoprolol tartrate (LOPRESSOR) 25 MG tablet Take 12.5 mg by mouth 2 (two) times daily.   pantoprazole (PROTONIX) 40 MG tablet Take 1 tablet (40 mg total) by mouth 2 (two) times daily. (Patient taking differently: Take 40 mg by mouth every morning.)   traZODone (DESYREL) 50 MG tablet TAKE 1/2 OR 1 TABLET BY MOUTH AT BEDTIME AS NEEDED FOR SLEEP (Patient taking differently: Take 50 mg by mouth at bedtime.)     Allergies:    Codeine and Flomax [tamsulosin hcl]   Social  History: Social History   Socioeconomic History   Marital status: Married    Spouse name: Not on file   Number of children: 2   Years of education: Not on file   Highest education level: Not on file  Occupational History   Occupation: Press photographer - Clinical cytogeneticist  Tobacco Use   Smoking status: Never   Smokeless tobacco: Never  Vaping Use   Vaping Use: Never used  Substance and Sexual Activity   Alcohol use: Not Currently    Comment: occassional   Drug use: No   Sexual activity: Not Currently  Other Topics Concern   Not on file  Social History Narrative   Not on file   Social Determinants of Health   Financial Resource Strain: Not on file  Food Insecurity: No Food Insecurity (08/19/2022)   Hunger Vital Sign    Worried About Running Out of Food in the Last Year: Never true    Ran Out  of Food in the Last Year: Never true  Transportation Needs: No Transportation Needs (08/19/2022)   PRAPARE - Hydrologist (Medical): No    Lack of Transportation (Non-Medical): No  Physical Activity: Not on file  Stress: Not on file  Social Connections: Not on file     Family History: The patient's family history includes Arrhythmia in his mother; COPD in his mother; Cancer in his brother; Sarcoidosis in his brother. There is no history of Stomach cancer, Pancreatic cancer, Esophageal cancer, Colon cancer, or Rectal cancer.  ROS:   All other ROS reviewed and negative. Pertinent positives noted in the HPI.     EKGs/Labs/Other Studies Reviewed:   The following studies were personally reviewed by me today:  EKG:  EKG is ordered today.  The ekg ordered today demonstrates normal sinus rhythm heart rate 93, no acute ischemic changes or evidence of infarction, and was personally reviewed by me.   Recent Labs: 02/22/2022: B Natriuretic Peptide 307.0; TSH 0.687 04/18/2022: Magnesium 1.9 08/15/2022: ALT 16 11/17/2022: Platelets 178 11/20/2022: BUN 24; Creatinine, Ser  3.20; Hemoglobin 11.2; Potassium 2.9; Sodium 142   Recent Lipid Panel    Component Value Date/Time   TRIG 159 (H) 03/17/2022 0754    Physical Exam:   VS:  BP 134/76 (BP Location: Right Arm, Patient Position: Sitting, Cuff Size: Large)   Pulse 93   Ht '5\' 10"'$  (1.778 m)   BMI 30.13 kg/m    Wt Readings from Last 3 Encounters:  11/20/22 210 lb (95.3 kg)  11/03/22 225 lb (102.1 kg)  10/28/22 230 lb (104.3 kg)    General: Well nourished, well developed, in no acute distress Head: Atraumatic, normal size  Eyes: PEERLA, EOMI  Neck: Supple, no JVD Endocrine: No thryomegaly Cardiac: Normal S1, S2; RRR; no murmurs, rubs, or gallops Lungs: Clear to auscultation bilaterally, no wheezing, rhonchi or rales  Abd: Soft, nontender, no hepatomegaly  Ext: No edema, pulses 2+ Musculoskeletal: No deformities, BUE and BLE strength normal and equal Skin: Warm and dry, no rashes   Neuro: Alert and oriented to person, place, time, and situation, CNII-XII grossly intact, no focal deficits  Psych: Normal mood and affect   ASSESSMENT:   Jeffrey Ayala is a 62 y.o. male who presents for the following: 1. Persistent atrial fibrillation ([place])     PLAN:   1. Persistent atrial fibrillation ([name]) -Status post cardioversion.  He will continue Eliquis.  CHA2DS2-VASc is 2.  Eliquis indefinitely.  Continue metoprolol.  His main risk factor seems to be severe sleep apnea with hypoxemia.  I encouraged him to follow-up with his sleep doctor.  He will see [place] back in 6 months.  He will monitor his heart rhythm on the Apple watch.  Suspect his A-fib was triggered by critical illness.  Hopefully he will maintain sinus rhythm status post cardioversion.      Disposition: Return in about 6 months (around 06/04/2023).  Medication Adjustments/Labs and Tests Ordered: Current medicines are reviewed at length with the patient today.  Concerns regarding medicines are outlined above.  No orders of the defined types were  placed in this encounter.  No orders of the defined types were placed in this encounter.   Patient Instructions  Medication Instructions:  The current medical regimen is effective;  continue present plan and medications.  *If you need a refill on your cardiac medications before your next appointment, please call your pharmacy*   Follow-Up: At [facility], you  and your health needs are our priority.  As part of our continuing mission to provide you with exceptional heart care, we have created designated Provider Care Teams.  These Care Teams include your primary Cardiologist (physician) and Advanced Practice Providers (APPs -  Physician Assistants and Nurse Practitioners) who all work together to provide you with the care you need, when you need it.  We recommend signing up for the patient portal called "MyChart".  Sign up information is provided on this After Visit Summary.  MyChart is used to connect with patients for Virtual Visits (Telemedicine).  Patients are able to view lab/test results, encounter notes, upcoming appointments, etc.  Non-urgent messages can be sent to your provider as well.   To learn more about what you can do with MyChart, go to [email or web address].    Your next appointment:   6 month(s)  Provider:   Evalina Field, MD      Time Spent with Patient: I have spent a total of 25 minutes with patient reviewing hospital notes, telemetry, EKGs, labs and examining the patient as well as establishing an assessment and plan that was discussed with the patient.  > 50% of time was spent in direct patient care.  Signed, Addison Naegeli. Audie Box, MD, [place]  [address], [place], [place] [address] [phone]  12/04/2022 9:30 AM

## 2022-12-02 ENCOUNTER — Encounter: Payer: Self-pay | Admitting: Physical Therapy

## 2022-12-02 ENCOUNTER — Ambulatory Visit (INDEPENDENT_AMBULATORY_CARE_PROVIDER_SITE_OTHER): Payer: BC Managed Care – PPO | Admitting: Physical Therapy

## 2022-12-02 DIAGNOSIS — M6281 Muscle weakness (generalized): Secondary | ICD-10-CM

## 2022-12-02 DIAGNOSIS — R2681 Unsteadiness on feet: Secondary | ICD-10-CM | POA: Diagnosis not present

## 2022-12-02 DIAGNOSIS — R2689 Other abnormalities of gait and mobility: Secondary | ICD-10-CM | POA: Diagnosis not present

## 2022-12-02 DIAGNOSIS — M25672 Stiffness of left ankle, not elsewhere classified: Secondary | ICD-10-CM

## 2022-12-02 DIAGNOSIS — M21372 Foot drop, left foot: Secondary | ICD-10-CM | POA: Diagnosis not present

## 2022-12-02 NOTE — Therapy (Signed)
OUTPATIENT PHYSICAL THERAPY TREATMENT NOTE   Patient Name: Jeffrey Ayala MRN: [id number] DOB:13-Jul-1961, 62 y.o., male Today's Date: 12/02/2022  PCP: [name], NP-C REFERRING PROVIDER: [name], MD   END OF SESSION:   PT End of Session - 12/02/22 0803     Visit Number 22    Number of Visits 35    Date for PT Re-Evaluation 04/22/23    Authorization Type [employer] comm PPO    Authorization Time Period 20 visit limit PT/OT/chiro, $35 co-pay    Authorization - Number of Visits 31    Progress Note Due on Visit 31    PT Start Time 0800    PT Stop Time 0843    PT Time Calculation (min) 43 min    Equipment Utilized During Treatment Gait belt    Activity Tolerance Patient tolerated treatment well;Patient limited by fatigue    Behavior During Therapy WFL for tasks assessed/performed                       Past Medical History:  Diagnosis Date   A-fib ([place])    Acute nonintractable headache 03/25/2022   Anemia    Arthritis    Atrial fibrillation, chronic ([name])    not on [facility]   Cardiac arrest ([place]) 02/2022   in the setting of septic shock   Cirrhosis of liver ([place])    Concussion    Dysrhythmia    afib   Eosinophilic esophagitis    EGD/bx by Dr. Havery Moros   ESRD needing dialysis [facility])    GERD (gastroesophageal reflux disease)    History of gastric ulcer    History of kidney stones    Intermittent dysphagia    Scrotal infection 02/2022   Septic shock ([place]) 02/2022   Vasoplegia - > L TMA and R BKA   Past Surgical History:  Procedure Laterality Date   AMPUTATION Right 03/15/2022   Procedure: RIGHT BELOW KNEE AMPUTATION;  Surgeon: [name], MD;  Location: [place];  Service: Orthopedics;  Laterality: Right;   AV FISTULA PLACEMENT Left 08/28/2022   Procedure: LEFT ARM ARTERIOVENOUS (AV) FISTULA CREATION;  Surgeon: Waynetta Sandy, MD;  Location: [place];  Service: Vascular;  Laterality: Left;   BIOPSY  10/26/2022   Procedure: BIOPSY;  Surgeon:  Yetta Flock, MD;  Location: [facility];  Service: Gastroenterology;;   CARDIOVERSION N/A 11/20/2022   Procedure: CARDIOVERSION;  Surgeon: Freada Bergeron, MD;  Location: [facility];  Service: Cardiovascular;  Laterality: N/A;   COLONOSCOPY WITH PROPOFOL N/A 10/26/2022   Procedure: COLONOSCOPY WITH PROPOFOL;  Surgeon: Yetta Flock, MD;  Location: [facility];  Service: Gastroenterology;  Laterality: N/A;   CYSTOSCOPY     CYSTOSCOPY WITH STENT PLACEMENT Bilateral 08/15/2022   Procedure: CYSTOSCOPY WITH STENT PLACEMENT;  Surgeon: Remi Haggard, MD;  Location: [place];  Service: Urology;  Laterality: Bilateral;   CYSTOSCOPY/URETEROSCOPY/HOLMIUM LASER/STENT PLACEMENT Bilateral 09/14/2022   Procedure: CYSTOSCOPY/URETEROSCOPY/HOLMIUM LASER/STENT PLACEMENT;  Surgeon: Raynelle Bring, MD;  [facility]: [facility];  Service: Urology;  Laterality: Bilateral;   DENTAL SURGERY     ESOPHAGOGASTRODUODENOSCOPY (EGD) WITH PROPOFOL N/A 10/26/2022   Procedure: ESOPHAGOGASTRODUODENOSCOPY (EGD) WITH PROPOFOL;  Surgeon: Yetta Flock, MD;  [facility];  Service: Gastroenterology;  Laterality: N/A;   FINGER SURGERY     HERNIA REPAIR     at age 75   IR FLUORO GUIDE CV LINE RIGHT  03/09/2022   IR PATIENT EVAL TECH 0-60 MINS  03/09/2022   IR US GUIDE VASC ACCESS RIGHT  03/09/2022   KNEE ARTHROSCOPY Left    LITHOTRIPSY     NEPHROLITHOTOMY     POLYPECTOMY  10/26/2022   Procedure: POLYPECTOMY;  Surgeon: Yetta Flock, MD;  Location: [facility];  Service: Gastroenterology;;   Carolynn Serve EXPLORATION N/A 03/16/2022   Procedure: IRRIGATION AND DEBRIDEMENT SCROTUM;  Surgeon: Raynelle Bring, MD;  Location: [place];  Service: Urology;  Laterality: N/A;   SCROTAL EXPLORATION N/A 03/28/2022   Procedure: IRRIGATION AND DEBRIDEMENT SCROTUM;  Surgeon: Ardis Hughs, MD;  Location: [place];  Service: Urology;  Laterality: N/A;   TONSILLECTOMY     TOTAL KNEE ARTHROPLASTY Bilateral 11/04/2018    Procedure: BILATERAL TOTAL KNEE ARTHROPLASTY;  Surgeon: [name], MD;  Location: [place];  Service: Orthopedics;  Laterality: Bilateral;  spinal/epidural per anesthesiologist   TRANSMETATARSAL AMPUTATION Left 03/15/2022   Procedure: LEFT TRANSMETATARSAL AMPUTATION;  Surgeon: [name], MD;  Location: [place];  Service: Orthopedics;  Laterality: Left;   Patient Active Problem List   Diagnosis Date Noted   OSA (obstructive sleep apnea) 11/08/2022   Benign neoplasm of colon 10/26/2022   Gastric polyp 10/26/2022   Suspected sleep apnea 10/01/2022   Daytime somnolence 10/01/2022   Snoring 10/01/2022   Acute bilateral obstructive uropathy 08/15/2022   Skin breakdown 07/29/2022   Suicidal ideations 06/09/2022   Chills (without fever) 06/09/2022   Severe depression ([place]) 06/09/2022   Acute cough 06/09/2022   End stage renal disease ([place]) 06/02/2022   Reactive depression 05/31/2022   Belching 05/27/2022   Gastroesophageal reflux disease 05/27/2022   Atrial fibrillation ([name]) 05/20/2022   Long term (current) use of anticoagulants 05/20/2022   S/P transmetatarsal amputation of foot, left ([place]) 04/18/2022   CKD (chronic kidney disease) stage 5, GFR less than 15 ml/min ([place]) 04/18/2022   Secondary hyperparathyroidism of renal origin ([place]) 04/10/2022   Pruritus, unspecified 04/10/2022   Nausea 04/10/2022   Iron deficiency anemia, unspecified 04/10/2022   Hypothyroidism, unspecified 04/10/2022   Other acute kidney failure ([place]) 04/10/2022   Dyspnea, unspecified 03/83/3383   Eosinophilic esophagitis 29/19/1660   Gastric ulcer, unspecified as acute or chronic, without hemorrhage or perforation 04/10/2022   Coagulation defect, unspecified ([place]) 04/10/2022   Allergy, unspecified, initial encounter 04/10/2022   Anaphylactic shock, unspecified, initial encounter 04/10/2022   Arthralgia of right lower leg    Acute blood loss anemia    Unilateral complete BKA, right, initial encounter ([place])  04/03/2022   S/P BKA (below knee amputation), right ([place]) 04/03/2022   Old myocardial infarction 04/02/2022   Dependence on renal dialysis ([place]) 04/02/2022   Acquired absence of right leg below knee ([place]) 04/02/2022   Cirrhosis ([place]) 03/31/2022   Fournier's gangrene in male    Scrotal infection 03/25/2022   Anemia 03/25/2022   Diarrhea 03/25/2022   Shock liver 03/25/2022   Acute respiratory failure with hypoxia ([place]) 03/25/2022   Gout 03/25/2022   Acute nonintractable headache 03/25/2022   Demand ischemia 03/25/2022   Thrombocytopenia ([place]) 03/25/2022   Obesity, Class III, BMI 40-49.9 (morbid obesity) ([place]) 03/25/2022   Cardiac arrest ([place]) 03/25/2022   Hypotension    Respiratory insufficiency    Cellulitis    Malnutrition of moderate degree 03/12/2022   Altered mental status    Gangrene of right foot ([place])    Gangrene of left foot ([place])    Severe protein-calorie malnutrition ([place]) 03/10/2022   Colon cancer screening    Pressure injury of skin 02/23/2022  Septic shock ([place]) 02/22/2022   AKI (acute kidney injury) ([place])    Generalized abdominal pain    Lactic acidosis    Atrial fibrillation with RVR ([name])    Hypokalemia    Hypomagnesemia    Pain in finger of right hand 08/14/2021   Degenerative joint disease of shoulder region 05/07/2020   Olecranon bursitis of right elbow 05/07/2020   Pain in joint of right shoulder 04/26/2020   S/P TKR (total knee replacement), bilateral 11/04/2018   Bilateral primary osteoarthritis of knee    DDD (degenerative disc disease), cervical 01/10/2018   Rupture of medial head of gastrocnemius, left, sequela 01/14/2017   Kidney stones 06/22/2016   Essential hypertension 08/01/2010   DYSTHYMIC DISORDER 07/08/2010   PALPITATIONS 07/08/2010    REFERRING DIAG: Z89.511 (ICD-10-CM) - Hx of below knee amputation, right   ONSET DATE: 06/19/2022 Prosthesis delivery  THERAPY DIAG:  Other abnormalities of gait and mobility  Unsteadiness on  feet  Muscle weakness (generalized)  Foot drop, left  Stiffness of left ankle, not elsewhere classified  Rationale for Evaluation and Treatment Rehabilitation  PERTINENT HISTORY: Rt BKA 03/15/22, Left MTA 03/15/2022, bil. TKA 11/04/18, ESRD / chronic kidney disease st5 dialysis Tues/Thurs/Sat, cirrhosis, cardiac arrest May 23 post intubation, A-Fib  PRECAUTIONS: Fall, BP only in RUE starting 10/6  SUBJECTIVE:   His wife checks left foot & limb 2x/day with no issues.  No symptoms or issues since cardioversion. FU Appt this Friday.    PAIN:  Are you having pain? Left Distal residual limb  0/10 with standing & gait  Right foot distal limb seated 0-2/10 and standing/walking "does not notice it." Focused on balance.   OBJECTIVE: (objective measures completed at initial evaluation unless otherwise dated)    COGNITION: 06/22/2022  Overall cognitive status: Within functional limits for tasks assessed and pt had flat affect so unable to fully assess cognition.   POSTURE: 06/22/2022  rounded shoulders, forward head, flexed trunk , and weight shift left   LOWER EXTREMITY ROM:   ROM P:passive  A:active Right Eval 06/22/2022 Left Eval 06/22/2022 Right 11/11/22 Left 11/11/22  Hip flexion        Hip extension Standing A: ~30* limitation Standing A: ~30* limitation Standing A: -15* Standing A: -5*  Hip abduction        Hip adduction        Hip internal rotation        Hip external rotation        Knee flexion        Knee extension Standing A: ~15* limitation Standing A: ~15* limitation Standing A: -5* Standing A: -2*  Ankle dorsiflexion   Standing A: ~25* limitation  Seated A: -18*  Ankle plantarflexion        Ankle inversion        Ankle eversion         (Blank rows = not tested)   LOWER EXTREMITY MMT:   MMT Right Eval 06/22/2022 Left Eval 06/22/2022 Right 11/11/22 Left  11/11/22  Hip flexion 4/5 4/5    Hip extension 2/5 functional 2/5 functional Standing 4/5  Standing 4/5  Hip abduction 2/5 functional 2/5 functional Standing  4/5 Standing 4/5  Hip adduction        Hip internal rotation        Hip external rotation        Knee flexion        Knee extension 3-/5 functional 3-/5 functional 5/5 5/5  Ankle dorsiflexion  2-/5  3-/5 Limited  by ROM  Ankle plantarflexion        Ankle inversion        Ankle eversion        (Blank rows = not tested)   TRANSFERS: 06/22/2022  Sit to stand: Mod A and from 22" w/c requiring armrests to push & RW to stabilize 06/22/2022  Stand to sit: Min A from RW to 22" w/c unable to release RW to reach for w/c until seated.   GAIT: 11/11/2022: Resting SpO2 99% HR 86 3 Minute Walk Test 402' with cane with SBA Gait Pattern: RLE abduction, decreased stance duration RLE with decreased step length LLE, step through pattern, flexed trunk, LLE foot drop After gait SpO2 99% HR 50 no symptoms of light-headed or ShOB  Gait Velocity 2.07 ft/sec with cane  Pt neg 12* ramp & 6" curb with cane & TTA prosthesis with CGA.  06/22/2022 Gait pattern: step to pattern, decreased step length- Left, decreased stance time- Right, decreased stride length, decreased hip/knee flexion- Right, circumduction- Right, Right hip hike, knee flexed in stance- Right, knee flexed in stance- Left, antalgic, trunk flexed, abducted- Right, and poor foot clearance- Left excessive weightbearing BUEs on RW Distance walked: 5' Assistive device utilized: Environmental consultant - 2 wheeled and right TTA prosthesis / left plate & shoe filler Level of assistance: Mod A Comments: post gait HR 82, SpO2 99% with dyspnea noted   FUNCTIONAL TESTs:  11/11/2022: Merrilee Jansky Balance 14/56 Timed Up & Go: with cane std 18.50 sec with CGA (time >13.5sec indicates high fall risk)  and  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     CURRENT PROSTHETIC WEAR ASSESSMENT: 11/11/2022: Pt verbalizes proper, independent prosthetic care including donning prosthesis with suction sleeve suspension, skin check, residual limb care, care of non-amputated limb, prosthetic cleaning, ply sock cleaning, correct ply sock adjustment, proper wear schedule/adjustment, and proper weight-bearing schedule/adjustment. Pt reports wear >90% of awake hours with no skin issues or limb pain.    06/22/2022 Patient is dependent with: skin check, residual limb care, care of non-amputated limb, prosthetic cleaning, ply sock cleaning, correct ply sock adjustment, proper wear schedule/adjustment, and proper weight-bearing schedule/adjustment Donning prosthesis: Total A and max cues required, Doffing prosthesis: Total A and wife able to doffe Prosthetic wear tolerance: 1-2 hours 1-2x/day, for 3 of 3 days since delivery.  Prosthetic weight bearing tolerance: 0 minutes unable to stand outside of PT Edema: pitting with >5sec capillary refill Residual limb condition: 70m wound with serous drainage & black scab present,  calf measurement is 42.2cm Prosthetic description:  silicon liner with pin suspension, flexible inner socket Total Contact design, dynamic response foot with hydraulic PF/DF K code/activity level with prosthetic use: Level 3     TODAY'S TREATMENT:  12/02/2022: Neuromuscular Re-ed: Reviewed HEP in corner: hip width stance head motions 4 directions (right/left, up/down & diagonals) -  on floor eyes open,  on floor eyes closed with RUE single finger support & on foam eyes open with RUE single finger support.  PT noted slight improvement in right/left & up/down but no change in diagonals.  Pt & wife report he has not been doing the diagonal motions & have not found compliant surface yet.  Also performed static stance  eyes closed on floor & on foam.  He loosed  balance on floor after ~3sec & on foam ~1sec. Standing in corner with BUE light touch on chair back:  alternating kicking LEs 10 reps ea with need to pause for restablization - flexion, abd & ext.  PT added to HEP including HO. Pt & wife verbalized understanding. PT recommending performing stretches daily & other exercises on variable types minimum of 3days/wk.  Pt & wife verbalized understanding.  Prosthetic Training with TTA prosthesis: Prosthesis appears 1/4" short using lifts in standing for level pelvis. Pt to make appt with prosthetist.   11/25/2022: Neuromuscular Re-ed: Resting HR 97 SpO2 98% HEP in corner. PT demo, verbal & HO cues. Pt & wife verbalized understanding & pt return demo understanding.  Post activity HR 111 SpO2 98% Sitting on 24" bar stool with feet on floor upright without back support. PT demo & verbal cues on sit to stand from 24" bar stool without UE assist with table in front for intermittent touch. Pt performed 5 reps with minA / CGA for stabilization.  Pt & wife verbalized understanding & pt return demo understanding for rationale of building tolerance for sitting to build endurance of trunk to upright without UE support and build strength/balance with sit to/from stand.  Therapeutic Exercise: PT instructed in head & shoulder posture related to shoulder function / pain & balance. PT instructed in chin tucks & scapula retraction supine & seated. PT demo, verbal & HO cues. Pt & wife verbalized understanding & pt return demo understanding.    11/11/2022: See objective data for ROM, strength, balance tests of Berg & TUG, gait & prosthetic use. Pt is scheduled for cardioversion next week. PT recommended advising cardiologist that his HR dropped with gait. After cardioversion PT recommended monitoring HR with activity to make sure that it increases slightly. Pt & wife verbalized understanding.   PT reviewed recommendation for building standing tolerance by using 24" bar  stool for upright without back or arm support & limited support of BLEs intermittently with standing near counter or support surface.  Perform hourly as long as tolerates each hour of work day time. Pt & wife verbalized understanding. PT reviewed recommendation to build walking tolerance by short walks (room to room) with increasing frequency during awake hours, long walk (max tolerable) 1-2 x/day may use shopping cart or RW and medium distance (bw short & long) 5+ x/day.  Pt & wife verbalized understanding.  PT, pt & wife discussed outcomes and decided on POC noted below. Both are in agreement in continuing PT with plan for PT to guide HEP / activities for home to make further progress. Pt verbalizes understanding that he will need to be compliant to make gains.    HOME EXERCISE PROGRAM: Access Code: [email or web address] URL: [email or web address] Date: 12/02/2022 Prepared by: Jamey Reas  Exercises - Standing Gastroc Stretch at Counter  - 1-3 x daily - 7 x weekly - 1 sets - 2-3 reps - 20-30 seconds hold - Standing Ankle Dorsiflexion Stretch  - 1-3 x daily - 7 x weekly - 1 sets - 2-3 reps - 20-30 seconds hold - Gastroc Stretch on Step  - 1-3 x daily - 7 x weekly - 1 sets - 2-3 reps - 20-30 seconds hold - Standing Soleus Stretch on Step with Counter Support  - 1-3 x daily - 7 x weekly - 1 sets - 2-3 reps - 20-30 seconds hold - Seated Calf Stretch with Strap  - 1-3 x daily -  7 x weekly - 1 sets - 2-3 reps - 20-30 seconds hold - Seated Soleus Stretch with Strap  - 1-3 x daily - 7 x weekly - 1 sets - 2-3 reps - 20-30 seconds hold - wide stance head motions eyes open  - 1 x daily - 3-5 x weekly - 1 sets - 10 reps - 2 seconds hold - Wide Stance with Eyes Closed  - 1 x daily - 3-5 x weekly - 1 sets - 10 reps - 10 seconds hold - Feet Apart with Eyes Closed with Head Motions  - 1 x daily - 3-5 x weekly - 1 sets - 10 reps - 2 seconds hold - Wide stance on Foam Pad head movements  - 1 x daily - 3-5  x weekly - 1 sets - 10 reps - 2 seconds hold - Supine Cervical Retraction with Towel  - 1 x daily - 7 x weekly - 1 sets - 10 reps - 5 seconds hold - Cervical Retraction with Overpressure  - 1 x daily - 7 x weekly - 1 sets - 10 reps - 5 seconds hold - Supine Scapular Retraction  - 1 x daily - 7 x weekly - 1 sets - 10 reps - 5 seconds hold - Seated Scapular Retraction  - 1 x daily - 3-5 x weekly - 1 sets - 10 reps - 5 seconds hold - Alternating Hip Flexion Forward Kicks  - 1 x daily - 3-5 x weekly - 1 sets - 10 reps - Standing hip abduction alternating legs  - 1 x daily - 3-5 x weekly - 1 sets - 10 reps - Standing hip extension alternating legs  - 1 x daily - 3-5 x weekly - 1 sets - 10 reps   AT [place] AND PLACE FEET EQUAL DISTANCE FROM THE MIDLINE.  Try to find this position when standing still for activities.   USE TAPE ON FLOOR TO MARK THE MIDLINE POSITION which is even with middle of sink.  You also should try to feel with your limb pressure in socket.  You are trying to feel with limb what you used to feel with the bottom of your foot.  Side to Side Shift: Moving your hips only (not shoulders): move weight onto your left leg, HOLD/FEEL pressure in socket.  Move back to equal weight on each leg, HOLD/FEEL pressure in socket. Move weight onto your right leg, HOLD/FEEL pressure in socket. Move back to equal weight on each leg, HOLD/FEEL pressure in socket. Repeat.  Start with both hands on sink, progress to hand on prosthetic side only, then no hands.  Front to Back Shift: Moving your hips only (not shoulders): move your weight forward onto your toes, HOLD/FEEL pressure in socket. Move your weight back to equal Flat Foot on both legs, HOLD/FEEL  pressure in socket. Move your weight back onto your heels, HOLD/FEEL  pressure in socket. Move your weight back to equal on both legs, HOLD/FEEL  pressure in socket. Repeat.  Start with both hands on sink, progress to hand on  prosthetic side only, then no hands.  Moving Cones / Cups: With equal weight on each leg: Hold on with one hand the first time, then progress to no hand supports. Move cups from one side of sink to the other. Place cups ~2" out of your reach, progress to 10" beyond reach.  Place one hand in middle of sink and reach with other hand. Do both arms.  Then hover one hand and move cups with other hand.  Overhead/Upward Reaching: alternated reaching up to top cabinets or ceiling if no cabinets present. Keep equal weight on each leg. Start with one hand support on counter while other hand reaches and progress to no hand support with reaching.  ace one hand in middle of sink and reach with other hand. Do both arms.  Then hover one hand and move cups with other hand.  5.   Looking Over Shoulders: With equal weight on each leg: alternate turning to look over your shoulders with one hand support on counter as needed.  Start with head motions only to look in front of shoulder, then even with shoulder and progress to looking behind you. To look to side, move head /eyes, then shoulder on side looking pulls back, shift more weight to side looking and pull hip back. Place one hand in middle of sink and let go with other hand so your shoulder can pull back. Switch hands to look other way.   Then hover one hand and look over shoulder. If looking right, use left hand at sink. If looking left, use right hand at sink. 6.  Stepping with leg that is not amputated:  Move items under cabinet out of your way. Shift your hips/pelvis so weight on prosthesis. Tighten muscles in hip on prosthetic side.  SLOWLY step other leg so front of foot is in cabinet. Then step back to floor.   Pt also recommending sitting at edge of w/c with feet supported on floor but no back support each hour in w/c until back fatigues. PT also demo & instructed in sitting on 24" stool. Pt & wife verbalized understanding.     ASSESSMENT:   CLINICAL  IMPRESSION: Patient appears to understand updated HEP & need to perform all 4 directions of head movements for corner balance activities.  Pt's prosthesis appears short 1/4".  He experienced back pain with standing which may be related to height issue.  Pt continues to benefit from PT to progress function & safety.    OBJECTIVE IMPAIRMENTS Abnormal gait, cardiopulmonary status limiting activity, decreased activity tolerance, decreased balance, decreased endurance, decreased knowledge of condition, decreased knowledge of use of DME, decreased mobility, difficulty walking, decreased ROM, decreased strength, increased edema, impaired flexibility, postural dysfunction, and prosthetic dependency .    ACTIVITY LIMITATIONS carrying, lifting, bending, sitting, standing, squatting, stairs, transfers, locomotion level, and prosthesis use   PARTICIPATION LIMITATIONS: driving, community activity, occupation, yard work, and recreational of camping   [place], Past/current experiences, Time since onset of injury/illness/exacerbation, and 3+ comorbidities: see PMH  are also affecting patient's functional outcome.    REHAB POTENTIAL: Good   CLINICAL DECISION MAKING: Evolving/moderate complexity   EVALUATION COMPLEXITY: Moderate     GOALS: Goals reviewed with patient? Yes   SHORT TERM GOALS: Target date: 4 visits after re-eval (visit number 24)   Timed Up & Go with cane <15 sec.  Goal status: Set 11/11/2022 2.  Patient demo & verbalizes understanding of updated HEP.  Goal status: Set 11/11/2022   3.  pt amb >500' with cane & prosthesis in 3 minutes with no LOB. Goal status: Set 11/11/2022      UPDATED LONG TERM GOALS: Target date: 11/19/2022   Patient demonstrates & verbalized understanding of prosthetic care to enable safe utilization of prosthesis. Goal status: MET 11/11/2022  2.   Patient tolerates prosthesis wear >90% of awake hours without skin or limb pain issues. Goal  status: MET 11/11/2022   3.   Berg Balance >/= 36/56 to indicate lower fall risk Goal status: Not MET 11/11/2022   4.   Patient ambulates >300' with prosthesis & LRAD independently Goal status:  MET 11/11/2022 with RW & partially met with cane   5.   Patient negotiates ramps, curbs & stairs with single rail with prosthesis & LRAD independently. Goal status: MET 11/11/2022 with RW & ongoing with cane    UPDATED LONG TERM GOALS: Target date: 04/22/2023   Patient demonstrates & verbalized understanding of ongoing HEP / fitness plan. Goal status: set 11/11/2022  2.   Patient Timed Up & Go <13.5 sec standard & <15sec cognitive Goal status: set 11/11/2022   3.   Berg Balance >/= 36/56 to indicate lower fall risk Goal status: Ongoing 11/11/2022   4.   Patient ambulates >500' with prosthesis & cane or less independently Goal status:Ongoing 08/24/2022   5.   Patient negotiates ramps, curbs & stairs with single rail with prosthesis & cane or less independently. Goal status: Ongoing 08/24/2022     PLAN: PT FREQUENCY: 1x every 1-2 weeks for 15 visits    PT DURATION: 15 visits over 5 months   PLANNED INTERVENTIONS: Therapeutic exercises, Therapeutic activity, Neuromuscular re-education, Balance training, Gait training, Patient/Family education, Self Care, Stair training, Vestibular training, Canalith repositioning, Prosthetic training, DME instructions, Vasopneumatic device, and physical performance testing   PLAN FOR NEXT SESSION:  continue to progress balance & mobility safety.    Jamey Reas, PT, DPT 12/02/2022, 10:42 AM

## 2022-12-04 ENCOUNTER — Ambulatory Visit: Payer: BC Managed Care – PPO | Attending: Cardiovascular Disease | Admitting: Cardiovascular Disease

## 2022-12-04 ENCOUNTER — Encounter: Payer: Self-pay | Admitting: Cardiovascular Disease

## 2022-12-04 VITALS — BP 134/76 | HR 93 | Ht 70.0 in

## 2022-12-04 DIAGNOSIS — I4819 Other persistent atrial fibrillation: Secondary | ICD-10-CM

## 2022-12-04 MED ORDER — APIXABAN 5 MG PO TABS: 5.0000 mg | ORAL_TABLET | Freq: Two times a day (BID) | ORAL | 3 refills | Status: DC

## 2022-12-04 NOTE — Patient Instructions (Signed)
Medication Instructions:  The current medical regimen is effective;  continue present plan and medications.  *If you need a refill on your cardiac medications before your next appointment, please call your pharmacy*   Follow-Up: At [facility], you and your health needs are our priority.  As part of our continuing mission to provide you with exceptional heart care, we have created designated Provider Care Teams.  These Care Teams include your primary Cardiologist (physician) and Advanced Practice Providers (APPs -  Physician Assistants and Nurse Practitioners) who all work together to provide you with the care you need, when you need it.  We recommend signing up for the patient portal called "MyChart".  Sign up information is provided on this After Visit Summary.  MyChart is used to connect with patients for Virtual Visits (Telemedicine).  Patients are able to view lab/test results, encounter notes, upcoming appointments, etc.  Non-urgent messages can be sent to your provider as well.   To learn more about what you can do with MyChart, go to [email or web address].    Your next appointment:   6 month(s)  Provider:   [place] T O'Neal, MD     

## 2022-12-09 ENCOUNTER — Encounter: Payer: Self-pay | Admitting: Physical Therapy

## 2022-12-09 ENCOUNTER — Ambulatory Visit (INDEPENDENT_AMBULATORY_CARE_PROVIDER_SITE_OTHER): Payer: BC Managed Care – PPO | Admitting: Physical Therapy

## 2022-12-09 DIAGNOSIS — R2689 Other abnormalities of gait and mobility: Secondary | ICD-10-CM | POA: Diagnosis not present

## 2022-12-09 DIAGNOSIS — M6281 Muscle weakness (generalized): Secondary | ICD-10-CM | POA: Diagnosis not present

## 2022-12-09 DIAGNOSIS — R2681 Unsteadiness on feet: Secondary | ICD-10-CM | POA: Diagnosis not present

## 2022-12-09 DIAGNOSIS — M21372 Foot drop, left foot: Secondary | ICD-10-CM | POA: Diagnosis not present

## 2022-12-09 DIAGNOSIS — M25672 Stiffness of left ankle, not elsewhere classified: Secondary | ICD-10-CM

## 2022-12-09 NOTE — Patient Instructions (Signed)
HEP  12/09/2022  1.pick up speed with longer & quicker steps. 2. Walk with eyes closed alternating eyes open 3. Near counter work on standing up & walking without hesitancy 4. Turn around towards counter working on decreasing number of steps. 5. Walk with head turns on X & T  goal is maintain path & pace.

## 2022-12-09 NOTE — Therapy (Signed)
OUTPATIENT PHYSICAL THERAPY TREATMENT NOTE   Patient Name: Jeffrey Ayala MRN: [id number] DOB:February 11, 1961, 62 y.o., male Today's Date: 12/09/2022  PCP: [name], NP-C REFERRING PROVIDER: [name], MD   END OF SESSION:   PT End of Session - 12/09/22 0807     Visit Number 23    Number of Visits 35    Date for PT Re-Evaluation 04/22/23    Authorization Type [employer] comm PPO    Authorization Time Period 20 visit limit PT/OT/chiro, $35 co-pay    Authorization - Number of Visits 31    Progress Note Due on Visit 92    PT Start Time 0804    PT Stop Time 0846    PT Time Calculation (min) 42 min    Equipment Utilized During Treatment Gait belt    Activity Tolerance Patient tolerated treatment well;Patient limited by fatigue    Behavior During Therapy WFL for tasks assessed/performed                        Past Medical History:  Diagnosis Date   A-fib ([place])    Acute nonintractable headache 03/25/2022   Anemia    Arthritis    Atrial fibrillation, chronic ([name])    not on [facility]   Cardiac arrest ([place]) 02/2022   in the setting of septic shock   Cirrhosis of liver ([name])    Concussion    Dysrhythmia    afib   Eosinophilic esophagitis    EGD/bx by Dr. Havery Moros   ESRD needing dialysis [facility])    GERD (gastroesophageal reflux disease)    History of gastric ulcer    History of kidney stones    Intermittent dysphagia    Scrotal infection 02/2022   Septic shock ([place]) 02/2022   Vasoplegia - > L TMA and R BKA   Past Surgical History:  Procedure Laterality Date   AMPUTATION Right 03/15/2022   Procedure: RIGHT BELOW KNEE AMPUTATION;  Surgeon: [name], MD;  Location: [place];  Service: Orthopedics;  Laterality: Right;   AV FISTULA PLACEMENT Left 08/28/2022   Procedure: LEFT ARM ARTERIOVENOUS (AV) FISTULA CREATION;  Surgeon: Waynetta Sandy, MD;  Location: [place];  Service: Vascular;  Laterality: Left;   BIOPSY  10/26/2022   Procedure: BIOPSY;   Surgeon: Yetta Flock, MD;  Location: [facility];  Service: Gastroenterology;;   CARDIOVERSION N/A 11/20/2022   Procedure: CARDIOVERSION;  Surgeon: Freada Bergeron, MD;  Location: [facility] ENDOSCOPY;  Service: Cardiovascular;  Laterality: N/A;   COLONOSCOPY WITH PROPOFOL N/A 10/26/2022   Procedure: COLONOSCOPY WITH PROPOFOL;  Surgeon: Yetta Flock, MD;  [facility]: [facility];  Service: Gastroenterology;  Laterality: N/A;   CYSTOSCOPY     CYSTOSCOPY WITH STENT PLACEMENT Bilateral 08/15/2022   Procedure: CYSTOSCOPY WITH STENT PLACEMENT;  Surgeon: Remi Haggard, MD;  Location: [place];  Service: Urology;  Laterality: Bilateral;   CYSTOSCOPY/URETEROSCOPY/HOLMIUM LASER/STENT PLACEMENT Bilateral 09/14/2022   Procedure: CYSTOSCOPY/URETEROSCOPY/HOLMIUM LASER/STENT PLACEMENT;  Surgeon: Raynelle Bring, MD;  Location: [facility];  Service: Urology;  Laterality: Bilateral;   DENTAL SURGERY     ESOPHAGOGASTRODUODENOSCOPY (EGD) WITH PROPOFOL N/A 10/26/2022   Procedure: ESOPHAGOGASTRODUODENOSCOPY (EGD) WITH PROPOFOL;  Surgeon: Yetta Flock, MD;  [facility];  Service: Gastroenterology;  Laterality: N/A;   FINGER SURGERY     HERNIA REPAIR     at age 24   IR FLUORO GUIDE CV LINE RIGHT  03/09/2022   IR PATIENT EVAL TECH 0-60 MINS  03/09/2022   IR US GUIDE VASC ACCESS RIGHT  03/09/2022   KNEE ARTHROSCOPY Left    LITHOTRIPSY     NEPHROLITHOTOMY     POLYPECTOMY  10/26/2022   Procedure: POLYPECTOMY;  Surgeon: Yetta Flock, MD;  Location: [facility];  Service: Gastroenterology;;   Carolynn Serve EXPLORATION N/A 03/16/2022   Procedure: IRRIGATION AND DEBRIDEMENT SCROTUM;  Surgeon: Raynelle Bring, MD;  Location: [place];  Service: Urology;  Laterality: N/A;   SCROTAL EXPLORATION N/A 03/28/2022   Procedure: IRRIGATION AND DEBRIDEMENT SCROTUM;  Surgeon: Ardis Hughs, MD;  Location: [place];  Service: Urology;  Laterality: N/A;   TONSILLECTOMY     TOTAL KNEE ARTHROPLASTY Bilateral  11/04/2018   Procedure: BILATERAL TOTAL KNEE ARTHROPLASTY;  Surgeon: [name], MD;  Location: [place];  Service: Orthopedics;  Laterality: Bilateral;  spinal/epidural per anesthesiologist   TRANSMETATARSAL AMPUTATION Left 03/15/2022   Procedure: LEFT TRANSMETATARSAL AMPUTATION;  Surgeon: [name], MD;  Location: [place];  Service: Orthopedics;  Laterality: Left;   Patient Active Problem List   Diagnosis Date Noted   OSA (obstructive sleep apnea) 11/08/2022   Benign neoplasm of colon 10/26/2022   Gastric polyp 10/26/2022   Suspected sleep apnea 10/01/2022   Daytime somnolence 10/01/2022   Snoring 10/01/2022   Acute bilateral obstructive uropathy 08/15/2022   Skin breakdown 07/29/2022   Suicidal ideations 06/09/2022   Chills (without fever) 06/09/2022   Severe depression ([place]) 06/09/2022   Acute cough 06/09/2022   End stage renal disease ([place]) 06/02/2022   Reactive depression 05/31/2022   Belching 05/27/2022   Gastroesophageal reflux disease 05/27/2022   Atrial fibrillation ([place]) 05/20/2022   Long term (current) use of anticoagulants 05/20/2022   S/P transmetatarsal amputation of foot, left ([place]) 04/18/2022   CKD (chronic kidney disease) stage 5, GFR less than 15 ml/min ([place]) 04/18/2022   Secondary hyperparathyroidism of renal origin ([place]) 04/10/2022   Pruritus, unspecified 04/10/2022   Nausea 04/10/2022   Iron deficiency anemia, unspecified 04/10/2022   Hypothyroidism, unspecified 04/10/2022   Other acute kidney failure ([place]) 04/10/2022   Dyspnea, unspecified 76/28/3151   Eosinophilic esophagitis 76/16/0737   Gastric ulcer, unspecified as acute or chronic, without hemorrhage or perforation 04/10/2022   Coagulation defect, unspecified ([place]) 04/10/2022   Allergy, unspecified, initial encounter 04/10/2022   Anaphylactic shock, unspecified, initial encounter 04/10/2022   Arthralgia of right lower leg    Acute blood loss anemia    Unilateral complete BKA, right, initial  encounter ([place]) 04/03/2022   S/P BKA (below knee amputation), right ([place]) 04/03/2022   Old myocardial infarction 04/02/2022   Dependence on renal dialysis ([place]) 04/02/2022   Acquired absence of right leg below knee ([place]) 04/02/2022   Cirrhosis ([place]) 03/31/2022   Fournier's gangrene in male    Scrotal infection 03/25/2022   Anemia 03/25/2022   Diarrhea 03/25/2022   Shock liver 03/25/2022   Acute respiratory failure with hypoxia ([place]) 03/25/2022   Gout 03/25/2022   Acute nonintractable headache 03/25/2022   Demand ischemia 03/25/2022   Thrombocytopenia ([place]) 03/25/2022   Obesity, Class III, BMI 40-49.9 (morbid obesity) ([place]) 03/25/2022   Cardiac arrest ([place]) 03/25/2022   Hypotension    Respiratory insufficiency    Cellulitis    Malnutrition of moderate degree 03/12/2022   Altered mental status    Gangrene of right foot ([place])    Gangrene of left foot ([place])    Severe protein-calorie malnutrition ([place]) 03/10/2022   Colon cancer screening    Pressure injury of skin 02/23/2022  Septic shock ([place]) 02/22/2022   AKI (acute kidney injury) ([place])    Generalized abdominal pain    Lactic acidosis    Atrial fibrillation with RVR ([name])    Hypokalemia    Hypomagnesemia    Pain in finger of right hand 08/14/2021   Degenerative joint disease of shoulder region 05/07/2020   Olecranon bursitis of right elbow 05/07/2020   Pain in joint of right shoulder 04/26/2020   S/P TKR (total knee replacement), bilateral 11/04/2018   Bilateral primary osteoarthritis of knee    DDD (degenerative disc disease), cervical 01/10/2018   Rupture of medial head of gastrocnemius, left, sequela 01/14/2017   Kidney stones 06/22/2016   Essential hypertension 08/01/2010   DYSTHYMIC DISORDER 07/08/2010   PALPITATIONS 07/08/2010    REFERRING DIAG: Z89.511 (ICD-10-CM) - Hx of below knee amputation, right   ONSET DATE: 06/19/2022 Prosthesis delivery  THERAPY DIAG:  Other abnormalities of gait and  mobility  Unsteadiness on feet  Muscle weakness (generalized)  Foot drop, left  Stiffness of left ankle, not elsewhere classified  Rationale for Evaluation and Treatment Rehabilitation  PERTINENT HISTORY: Rt BKA 03/15/22, Left MTA 03/15/2022, bil. TKA 11/04/18, ESRD / chronic kidney disease st5 dialysis Tues/Thurs/Sat, cirrhosis, cardiac arrest May 23 post intubation, A-Fib  PRECAUTIONS: Fall, BP only in RUE starting 10/6  SUBJECTIVE:   He saw prosthetist last week who corrected height and adjusted alignment.  It feels better.  He saw cardiologist who is recommending work with sleep MD address night hypoxia.  He continues to do exercises.   PAIN:  Are you having pain? Left Distal residual limb  0/10 with standing & gait  Right foot distal limb seated 0-2/10 and standing/walking "does not notice it." Focused on balance.   OBJECTIVE: (objective measures completed at initial evaluation unless otherwise dated)    COGNITION: 06/22/2022  Overall cognitive status: Within functional limits for tasks assessed and pt had flat affect so unable to fully assess cognition.   POSTURE: 06/22/2022  rounded shoulders, forward head, flexed trunk , and weight shift left   LOWER EXTREMITY ROM:   ROM P:passive  A:active Right Eval 06/22/2022 Left Eval 06/22/2022 Right 11/11/22 Left 11/11/22  Hip flexion        Hip extension Standing A: ~30* limitation Standing A: ~30* limitation Standing A: -15* Standing A: -5*  Hip abduction        Hip adduction        Hip internal rotation        Hip external rotation        Knee flexion        Knee extension Standing A: ~15* limitation Standing A: ~15* limitation Standing A: -5* Standing A: -2*  Ankle dorsiflexion   Standing A: ~25* limitation  Seated A: -18*  Ankle plantarflexion        Ankle inversion        Ankle eversion         (Blank rows = not tested)   LOWER EXTREMITY MMT:   MMT Right Eval 06/22/2022 Left Eval 06/22/2022  Right 11/11/22 Left  11/11/22  Hip flexion 4/5 4/5    Hip extension 2/5 functional 2/5 functional Standing 4/5 Standing 4/5  Hip abduction 2/5 functional 2/5 functional Standing  4/5 Standing 4/5  Hip adduction        Hip internal rotation        Hip external rotation        Knee flexion        Knee  extension 3-/5 functional 3-/5 functional 5/5 5/5  Ankle dorsiflexion  2-/5   3-/5 Limited  by ROM  Ankle plantarflexion        Ankle inversion        Ankle eversion        (Blank rows = not tested)   TRANSFERS: 06/22/2022  Sit to stand: Mod A and from 22" w/c requiring armrests to push & RW to stabilize 06/22/2022  Stand to sit: Min A from RW to 22" w/c unable to release RW to reach for w/c until seated.   GAIT: 11/11/2022: Resting SpO2 99% HR 86 3 Minute Walk Test 402' with cane with SBA Gait Pattern: RLE abduction, decreased stance duration RLE with decreased step length LLE, step through pattern, flexed trunk, LLE foot drop After gait SpO2 99% HR 50 no symptoms of light-headed or ShOB  Gait Velocity 2.07 ft/sec with cane  Pt neg 12* ramp & 6" curb with cane & TTA prosthesis with CGA.  06/22/2022 Gait pattern: step to pattern, decreased step length- Left, decreased stance time- Right, decreased stride length, decreased hip/knee flexion- Right, circumduction- Right, Right hip hike, knee flexed in stance- Right, knee flexed in stance- Left, antalgic, trunk flexed, abducted- Right, and poor foot clearance- Left excessive weightbearing BUEs on RW Distance walked: 5' Assistive device utilized: Environmental consultant - 2 wheeled and right TTA prosthesis / left plate & shoe filler Level of assistance: Mod A Comments: post gait HR 82, SpO2 99% with dyspnea noted   FUNCTIONAL TESTs:  11/11/2022: Merrilee Jansky Balance 14/56 Timed Up & Go: with cane std 18.50 sec with CGA (time >13.5sec indicates high fall risk)  and  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     CURRENT PROSTHETIC WEAR ASSESSMENT: 11/11/2022: Pt verbalizes proper, independent prosthetic care including donning prosthesis with suction sleeve suspension, skin check, residual limb care, care of non-amputated limb, prosthetic cleaning, ply sock cleaning, correct ply sock adjustment, proper wear schedule/adjustment, and proper weight-bearing schedule/adjustment. Pt reports wear >90% of awake hours with no skin issues or limb pain.    06/22/2022 Patient is dependent with: skin check, residual limb care, care of non-amputated limb, prosthetic cleaning, ply sock cleaning, correct ply sock adjustment, proper wear schedule/adjustment, and proper weight-bearing schedule/adjustment Donning prosthesis: Total A and max cues required, Doffing prosthesis: Total A and wife able to doffe Prosthetic wear tolerance: 1-2 hours 1-2x/day, for 3 of 3 days since delivery.  Prosthetic weight bearing tolerance: 0 minutes unable to stand outside of PT Edema: pitting with >5sec capillary refill Residual limb condition: 15m wound with serous drainage & black scab present,  calf measurement is 42.2cm Prosthetic description:  silicon liner with pin suspension, flexible inner socket Total Contact design, dynamic response foot with hydraulic PF/DF K code/activity level with prosthetic use: Level 3     TODAY'S TREATMENT:  12/09/2022: Prosthetic Training with TTA prosthesis: PT educated pt & wife on concerns with nocturnal hypoxia vs problem solving thru CPAP that has been recommended.  PT educated on correlation to function during day. Both verbalized understanding.   Worked on functional balance donning & doffing jacket standing in front of chair & counter/ support in front for safety.  Pt verbalized how to work on this  task  at home with safe set up.   PT educated verbally on travel recommendations with prosthesis. Fall risk is higher when prosthesis is off.  At home he uses w/c if needs to toilet in middle of night (mainly for urination).  PT recommending taking a urinal to travel so can urinate in timely manner and donne prosthesis to empty urinal.  Pt & wife verbalized understanding.    Pt performed the following activities as addition for HEP. Wife demo safe assistance with 1 & 2. Pt verbalized & return demo understanding of all.   HEP  12/09/2022  1.pick up speed with longer & quicker steps. 2. Walk with eyes closed alternating eyes open 3. Near counter work on standing up & walking without hesitancy 4. Turn around towards counter working on decreasing number of steps. 5. Walk with head turns on X & T  goal is maintain path & pace.  12/02/2022: Neuromuscular Re-ed: Reviewed HEP in corner: hip width stance head motions 4 directions (right/left, up/down & diagonals) -  on floor eyes open,  on floor eyes closed with RUE single finger support & on foam eyes open with RUE single finger support.  PT noted slight improvement in right/left & up/down but no change in diagonals.  Pt & wife report he has not been doing the diagonal motions & have not found compliant surface yet.  Also performed static stance eyes closed on floor & on foam.  He loosed balance on floor after ~3sec & on foam ~1sec. Standing in corner with BUE light touch on chair back:  alternating kicking LEs 10 reps ea with need to pause for restablization - flexion, abd & ext.  PT added to HEP including HO. Pt & wife verbalized understanding. PT recommending performing stretches daily & other exercises on variable types minimum of 3days/wk.  Pt & wife verbalized understanding.  Prosthetic Training with TTA prosthesis: Prosthesis appears 1/4" short using lifts in standing for level pelvis. Pt to make appt with prosthetist.   11/25/2022: Neuromuscular  Re-ed: Resting HR 97 SpO2 98% HEP in corner. PT demo, verbal & HO cues. Pt & wife verbalized understanding & pt return demo understanding.  Post activity HR 111 SpO2 98% Sitting on 24" bar stool with feet on floor upright without back support. PT demo & verbal cues on sit to stand from 24" bar stool without UE assist with table in front for intermittent touch. Pt performed 5 reps with minA / CGA for stabilization.  Pt & wife verbalized understanding & pt return demo understanding for rationale of building tolerance for sitting to build endurance of trunk to upright without UE support and build strength/balance with sit to/from stand.  Therapeutic Exercise: PT instructed in head & shoulder posture related to shoulder function / pain & balance. PT instructed in chin tucks & scapula retraction supine & seated. PT demo, verbal & HO cues. Pt & wife verbalized understanding & pt return demo understanding.    HOME EXERCISE PROGRAM: Access Code: [email or web address] URL: [email or web address] Date: 12/02/2022 Prepared by: Jamey Reas  Exercises - Standing Gastroc Stretch at Counter  - 1-3 x daily - 7 x weekly - 1 sets - 2-3 reps - 20-30 seconds hold - Standing Ankle Dorsiflexion Stretch  - 1-3 x daily - 7 x weekly - 1 sets - 2-3 reps - 20-30 seconds hold - Gastroc Stretch on Step  - 1-3 x daily - 7 x weekly - 1 sets - 2-3 reps - 20-30 seconds hold - Standing Soleus Stretch  on Step with Counter Support  - 1-3 x daily - 7 x weekly - 1 sets - 2-3 reps - 20-30 seconds hold - Seated Calf Stretch with Strap  - 1-3 x daily - 7 x weekly - 1 sets - 2-3 reps - 20-30 seconds hold - Seated Soleus Stretch with Strap  - 1-3 x daily - 7 x weekly - 1 sets - 2-3 reps - 20-30 seconds hold - wide stance head motions eyes open  - 1 x daily - 3-5 x weekly - 1 sets - 10 reps - 2 seconds hold - Wide Stance with Eyes Closed  - 1 x daily - 3-5 x weekly - 1 sets - 10 reps - 10 seconds hold - Feet Apart with Eyes  Closed with Head Motions  - 1 x daily - 3-5 x weekly - 1 sets - 10 reps - 2 seconds hold - Wide stance on Foam Pad head movements  - 1 x daily - 3-5 x weekly - 1 sets - 10 reps - 2 seconds hold - Supine Cervical Retraction with Towel  - 1 x daily - 7 x weekly - 1 sets - 10 reps - 5 seconds hold - Cervical Retraction with Overpressure  - 1 x daily - 7 x weekly - 1 sets - 10 reps - 5 seconds hold - Supine Scapular Retraction  - 1 x daily - 7 x weekly - 1 sets - 10 reps - 5 seconds hold - Seated Scapular Retraction  - 1 x daily - 3-5 x weekly - 1 sets - 10 reps - 5 seconds hold - Alternating Hip Flexion Forward Kicks  - 1 x daily - 3-5 x weekly - 1 sets - 10 reps - Standing hip abduction alternating legs  - 1 x daily - 3-5 x weekly - 1 sets - 10 reps - Standing hip extension alternating legs  - 1 x daily - 3-5 x weekly - 1 sets - 10 reps   AT [place] AND PLACE FEET EQUAL DISTANCE FROM THE MIDLINE.  Try to find this position when standing still for activities.   USE TAPE ON FLOOR TO MARK THE MIDLINE POSITION which is even with middle of sink.  You also should try to feel with your limb pressure in socket.  You are trying to feel with limb what you used to feel with the bottom of your foot.  Side to Side Shift: Moving your hips only (not shoulders): move weight onto your left leg, HOLD/FEEL pressure in socket.  Move back to equal weight on each leg, HOLD/FEEL pressure in socket. Move weight onto your right leg, HOLD/FEEL pressure in socket. Move back to equal weight on each leg, HOLD/FEEL pressure in socket. Repeat.  Start with both hands on sink, progress to hand on prosthetic side only, then no hands.  Front to Back Shift: Moving your hips only (not shoulders): move your weight forward onto your toes, HOLD/FEEL pressure in socket. Move your weight back to equal Flat Foot on both legs, HOLD/FEEL  pressure in socket. Move your weight back onto your heels, HOLD/FEEL  pressure in  socket. Move your weight back to equal on both legs, HOLD/FEEL  pressure in socket. Repeat.  Start with both hands on sink, progress to hand on prosthetic side only, then no hands.  Moving Cones / Cups: With equal weight on each leg: Hold on with one hand the first time, then progress to no hand supports. Move cups  from one side of sink to the other. Place cups ~2" out of your reach, progress to 10" beyond reach.  Place one hand in middle of sink and reach with other hand. Do both arms.  Then hover one hand and move cups with other hand.  Overhead/Upward Reaching: alternated reaching up to top cabinets or ceiling if no cabinets present. Keep equal weight on each leg. Start with one hand support on counter while other hand reaches and progress to no hand support with reaching.  ace one hand in middle of sink and reach with other hand. Do both arms.  Then hover one hand and move cups with other hand.  5.   Looking Over Shoulders: With equal weight on each leg: alternate turning to look over your shoulders with one hand support on counter as needed.  Start with head motions only to look in front of shoulder, then even with shoulder and progress to looking behind you. To look to side, move head /eyes, then shoulder on side looking pulls back, shift more weight to side looking and pull hip back. Place one hand in middle of sink and let go with other hand so your shoulder can pull back. Switch hands to look other way.   Then hover one hand and look over shoulder. If looking right, use left hand at sink. If looking left, use right hand at sink. 6.  Stepping with leg that is not amputated:  Move items under cabinet out of your way. Shift your hips/pelvis so weight on prosthesis. Tighten muscles in hip on prosthetic side.  SLOWLY step other leg so front of foot is in cabinet. Then step back to floor.   Pt also recommending sitting at edge of w/c with feet supported on floor but no back support each hour in w/c until  back fatigues. PT also demo & instructed in sitting on 24" stool. Pt & wife verbalized understanding.     ASSESSMENT:   CLINICAL IMPRESSION: Prosthetic changes helped improve balance with functional activities.  Pt appears to understand updated HEP.   Pt continues to benefit from PT to progress function & safety.    OBJECTIVE IMPAIRMENTS Abnormal gait, cardiopulmonary status limiting activity, decreased activity tolerance, decreased balance, decreased endurance, decreased knowledge of condition, decreased knowledge of use of DME, decreased mobility, difficulty walking, decreased ROM, decreased strength, increased edema, impaired flexibility, postural dysfunction, and prosthetic dependency .    ACTIVITY LIMITATIONS carrying, lifting, bending, sitting, standing, squatting, stairs, transfers, locomotion level, and prosthesis use   PARTICIPATION LIMITATIONS: driving, community activity, occupation, yard work, and recreational of camping   [place], Past/current experiences, Time since onset of injury/illness/exacerbation, and 3+ comorbidities: see PMH  are also affecting patient's functional outcome.    REHAB POTENTIAL: Good   CLINICAL DECISION MAKING: Evolving/moderate complexity   EVALUATION COMPLEXITY: Moderate     GOALS: Goals reviewed with patient? Yes   SHORT TERM GOALS: Target date: 4 visits after re-eval (visit number 24)   Timed Up & Go with cane <15 sec.  Goal status: Set 11/11/2022 2.  Patient demo & verbalizes understanding of updated HEP.  Goal status: Set 11/11/2022   3.  pt amb >500' with cane & prosthesis in 3 minutes with no LOB. Goal status: Set 11/11/2022   UPDATED LONG TERM GOALS: Target date: 04/22/2023   Patient demonstrates & verbalized understanding of ongoing HEP / fitness plan. Goal status: set 11/11/2022  2.   Patient Timed Up & Go <13.5 sec standard & <  15sec cognitive Goal status: set 11/11/2022   3.   Berg Balance >/= 36/56 to  indicate lower fall risk Goal status: Ongoing 11/11/2022   4.   Patient ambulates >500' with prosthesis & cane or less independently Goal status:Ongoing 08/24/2022   5.   Patient negotiates ramps, curbs & stairs with single rail with prosthesis & cane or less independently. Goal status: Ongoing 08/24/2022     PLAN: PT FREQUENCY: 1x every 1-2 weeks for 15 visits    PT DURATION: 15 visits over 5 months   PLANNED INTERVENTIONS: Therapeutic exercises, Therapeutic activity, Neuromuscular re-education, Balance training, Gait training, Patient/Family education, Self Care, Stair training, Vestibular training, Canalith repositioning, Prosthetic training, DME instructions, Vasopneumatic device, and physical performance testing   PLAN FOR NEXT SESSION:   check updated STGs, continue to progress balance & mobility safety.    Jamey Reas, PT, DPT 12/09/2022, 10:38 AM

## 2022-12-15 ENCOUNTER — Ambulatory Visit (HOSPITAL_COMMUNITY)
Admission: RE | Admit: 2022-12-15 | Discharge: 2022-12-15 | Disposition: A | Discharge status: Home / Self Care | Payer: BC Managed Care – PPO | Source: Ambulatory Visit | Attending: Nephrology | Admitting: Nephrology

## 2022-12-15 VITALS — BP 106/55 | HR 88 | Temp 97.2°F | Resp 18

## 2022-12-15 DIAGNOSIS — N185 Chronic kidney disease, stage 5: Secondary | ICD-10-CM | POA: Insufficient documentation

## 2022-12-15 DIAGNOSIS — D631 Anemia in chronic kidney disease: Secondary | ICD-10-CM | POA: Diagnosis not present

## 2022-12-15 DIAGNOSIS — D509 Iron deficiency anemia, unspecified: Secondary | ICD-10-CM | POA: Diagnosis not present

## 2022-12-15 DIAGNOSIS — N186 End stage renal disease: Secondary | ICD-10-CM | POA: Insufficient documentation

## 2022-12-15 LAB — RENAL FUNCTION PANEL
Albumin: 4 g/dL (ref 3.5–5.0)
Anion gap: 9 (ref 5–15)
BUN: 32 mg/dL — ABNORMAL HIGH (ref 8–23)
CO2: 22 mmol/L (ref 22–32)
Calcium: 9.2 mg/dL (ref 8.9–10.3)
Chloride: 104 mmol/L (ref 98–111)
Creatinine, Ser: 2.97 mg/dL — ABNORMAL HIGH (ref 0.61–1.24)
GFR, Estimated: 23 mL/min — ABNORMAL LOW (ref >60)
Glucose, Bld: 152 mg/dL — ABNORMAL HIGH (ref 70–99)
Phosphorus: 3 mg/dL (ref 2.5–4.6)
Potassium: 3.9 mmol/L (ref 3.5–5.1)
Sodium: 135 mmol/L (ref 135–145)

## 2022-12-15 LAB — CBC WITH DIFFERENTIAL/PLATELET
Abs Immature Granulocytes: 0.06 10*3/uL (ref 0.00–0.07)
Basophils Absolute: 0 10*3/uL (ref 0.0–0.1)
Basophils Relative: 1 %
Eosinophils Absolute: 0 10*3/uL (ref 0.0–0.5)
Eosinophils Relative: 0 %
HCT: 33.1 % — ABNORMAL LOW (ref 39.0–52.0)
Hemoglobin: 11.3 g/dL — ABNORMAL LOW (ref 13.0–17.0)
Immature Granulocytes: 1 %
Lymphocytes Relative: 15 %
Lymphs Abs: 1.2 10*3/uL (ref 0.7–4.0)
MCH: 33.2 pg (ref 26.0–34.0)
MCHC: 34.1 g/dL (ref 30.0–36.0)
MCV: 97.4 fL (ref 80.0–100.0)
Monocytes Absolute: 0.4 10*3/uL (ref 0.1–1.0)
Monocytes Relative: 5 %
Neutro Abs: 6.6 10*3/uL (ref 1.7–7.7)
Neutrophils Relative %: 78 %
Platelets: 213 10*3/uL (ref 150–400)
RBC: 3.4 MIL/uL — ABNORMAL LOW (ref 4.22–5.81)
RDW: 15.9 % — ABNORMAL HIGH (ref 11.5–15.5)
WBC: 8.4 10*3/uL (ref 4.0–10.5)
nRBC: 0 % (ref 0.0–0.2)

## 2022-12-15 LAB — FERRITIN: Ferritin: 1114 ng/mL — ABNORMAL HIGH (ref 24–336)

## 2022-12-15 LAB — POCT HEMOGLOBIN-HEMACUE: Hemoglobin: 10.9 g/dL — ABNORMAL LOW (ref 13.0–17.0)

## 2022-12-15 LAB — IRON AND TIBC
Iron: 93 ug/dL (ref 45–182)
Saturation Ratios: 38 % (ref 17.9–39.5)
TIBC: 244 ug/dL — ABNORMAL LOW (ref 250–450)
UIBC: 151 ug/dL

## 2022-12-15 MED ORDER — EPOETIN ALFA-EPBX 10000 UNIT/ML IJ SOLN: 20000.0000 [IU] | INTRAMUSCULAR | Status: DC

## 2022-12-15 MED ORDER — EPOETIN ALFA-EPBX 10000 UNIT/ML IJ SOLN
INTRAMUSCULAR | Status: AC
  Administered 2022-12-15: 20000 [IU] via SUBCUTANEOUS
  Filled 2022-12-15: qty 2

## 2022-12-16 ENCOUNTER — Encounter: Payer: BC Managed Care – PPO | Admitting: Physical Therapy

## 2022-12-23 ENCOUNTER — Encounter: Payer: BC Managed Care – PPO | Admitting: Physical Therapy

## 2022-12-23 ENCOUNTER — Encounter: Payer: Self-pay | Admitting: Physical Therapy

## 2022-12-23 ENCOUNTER — Ambulatory Visit (INDEPENDENT_AMBULATORY_CARE_PROVIDER_SITE_OTHER): Payer: BC Managed Care – PPO | Admitting: Physical Therapy

## 2022-12-23 DIAGNOSIS — M6281 Muscle weakness (generalized): Secondary | ICD-10-CM

## 2022-12-23 DIAGNOSIS — R2681 Unsteadiness on feet: Secondary | ICD-10-CM | POA: Diagnosis not present

## 2022-12-23 DIAGNOSIS — M79661 Pain in right lower leg: Secondary | ICD-10-CM

## 2022-12-23 DIAGNOSIS — M21372 Foot drop, left foot: Secondary | ICD-10-CM | POA: Diagnosis not present

## 2022-12-23 DIAGNOSIS — M25672 Stiffness of left ankle, not elsewhere classified: Secondary | ICD-10-CM

## 2022-12-23 DIAGNOSIS — R2689 Other abnormalities of gait and mobility: Secondary | ICD-10-CM

## 2022-12-23 DIAGNOSIS — L98498 Non-pressure chronic ulcer of skin of other sites with other specified severity: Secondary | ICD-10-CM

## 2022-12-23 NOTE — Therapy (Signed)
OUTPATIENT PHYSICAL THERAPY TREATMENT NOTE   Patient Name: Jeffrey Ayala MRN: [id number] DOB:1961/05/20, 62 y.o., male Today's Date: 12/23/2022  PCP: [name], NP-C REFERRING PROVIDER: [name], MD   END OF SESSION:   PT End of Session - 12/23/22 0807     Visit Number 24    Number of Visits 35    Date for PT Re-Evaluation 04/22/23    Authorization Type [employer] comm PPO    Authorization Time Period 20 visit limit PT/OT/chiro, $35 co-pay    Authorization - Number of Visits 31    Progress Note Due on Visit 44    PT Start Time 0805    PT Stop Time 0845    PT Time Calculation (min) 40 min    Equipment Utilized During Treatment Gait belt    Activity Tolerance Patient tolerated treatment well;Patient limited by fatigue    Behavior During Therapy WFL for tasks assessed/performed                         Past Medical History:  Diagnosis Date   A-fib ([place])    Acute nonintractable headache 03/25/2022   Anemia    Arthritis    Atrial fibrillation, chronic ([name])    not on [facility]   Cardiac arrest ([place]) 02/2022   in the setting of septic shock   Cirrhosis of liver ([place])    Concussion    Dysrhythmia    afib   Eosinophilic esophagitis    EGD/bx by Dr. Havery Moros   ESRD needing dialysis [facility])    GERD (gastroesophageal reflux disease)    History of gastric ulcer    History of kidney stones    Intermittent dysphagia    Scrotal infection 02/2022   Septic shock ([place]) 02/2022   Vasoplegia - > L TMA and R BKA   Past Surgical History:  Procedure Laterality Date   AMPUTATION Right 03/15/2022   Procedure: RIGHT BELOW KNEE AMPUTATION;  Surgeon: [name], MD;  Location: [place];  Service: Orthopedics;  Laterality: Right;   AV FISTULA PLACEMENT Left 08/28/2022   Procedure: LEFT ARM ARTERIOVENOUS (AV) FISTULA CREATION;  Surgeon: Waynetta Sandy, MD;  Location: [place];  Service: Vascular;  Laterality: Left;   BIOPSY  10/26/2022   Procedure: BIOPSY;   Surgeon: Yetta Flock, MD;  [facility];  Service: Gastroenterology;;   CARDIOVERSION N/A 11/20/2022   Procedure: CARDIOVERSION;  Surgeon: Freada Bergeron, MD;  Location: [facility] ENDOSCOPY;  Service: Cardiovascular;  Laterality: N/A;   COLONOSCOPY WITH PROPOFOL N/A 10/26/2022   Procedure: COLONOSCOPY WITH PROPOFOL;  Surgeon: Yetta Flock, MD;  [facility];  Service: Gastroenterology;  Laterality: N/A;   CYSTOSCOPY     CYSTOSCOPY WITH STENT PLACEMENT Bilateral 08/15/2022   Procedure: CYSTOSCOPY WITH STENT PLACEMENT;  Surgeon: Remi Haggard, MD;  Location: [place];  Service: Urology;  Laterality: Bilateral;   CYSTOSCOPY/URETEROSCOPY/HOLMIUM LASER/STENT PLACEMENT Bilateral 09/14/2022   Procedure: CYSTOSCOPY/URETEROSCOPY/HOLMIUM LASER/STENT PLACEMENT;  Surgeon: Raynelle Bring, MD;  [facility];  Service: Urology;  Laterality: Bilateral;   DENTAL SURGERY     ESOPHAGOGASTRODUODENOSCOPY (EGD) WITH PROPOFOL N/A 10/26/2022   Procedure: ESOPHAGOGASTRODUODENOSCOPY (EGD) WITH PROPOFOL;  Surgeon: Yetta Flock, MD;  [facility];  Service: Gastroenterology;  Laterality: N/A;   FINGER SURGERY     HERNIA REPAIR     at age 13   IR FLUORO GUIDE CV LINE RIGHT  03/09/2022   IR PATIENT EVAL TECH 0-60  MINS  03/09/2022   IR US GUIDE VASC ACCESS RIGHT  03/09/2022   KNEE ARTHROSCOPY Left    LITHOTRIPSY     NEPHROLITHOTOMY     POLYPECTOMY  10/26/2022   Procedure: POLYPECTOMY;  Surgeon: Yetta Flock, MD;  Location: [facility];  Service: Gastroenterology;;   Carolynn Serve EXPLORATION N/A 03/16/2022   Procedure: IRRIGATION AND DEBRIDEMENT SCROTUM;  Surgeon: Raynelle Bring, MD;  Location: [place];  Service: Urology;  Laterality: N/A;   SCROTAL EXPLORATION N/A 03/28/2022   Procedure: IRRIGATION AND DEBRIDEMENT SCROTUM;  Surgeon: Ardis Hughs, MD;  Location: [place];  Service: Urology;  Laterality: N/A;   TONSILLECTOMY     TOTAL KNEE ARTHROPLASTY Bilateral  11/04/2018   Procedure: BILATERAL TOTAL KNEE ARTHROPLASTY;  Surgeon: [name], MD;  Location: [place];  Service: Orthopedics;  Laterality: Bilateral;  spinal/epidural per anesthesiologist   TRANSMETATARSAL AMPUTATION Left 03/15/2022   Procedure: LEFT TRANSMETATARSAL AMPUTATION;  Surgeon: [name], MD;  Location: [place];  Service: Orthopedics;  Laterality: Left;   Patient Active Problem List   Diagnosis Date Noted   OSA (obstructive sleep apnea) 11/08/2022   Benign neoplasm of colon 10/26/2022   Gastric polyp 10/26/2022   Suspected sleep apnea 10/01/2022   Daytime somnolence 10/01/2022   Snoring 10/01/2022   Acute bilateral obstructive uropathy 08/15/2022   Skin breakdown 07/29/2022   Suicidal ideations 06/09/2022   Chills (without fever) 06/09/2022   Severe depression ([place]) 06/09/2022   Acute cough 06/09/2022   End stage renal disease ([place]) 06/02/2022   Reactive depression 05/31/2022   Belching 05/27/2022   Gastroesophageal reflux disease 05/27/2022   Atrial fibrillation ([place]) 05/20/2022   Long term (current) use of anticoagulants 05/20/2022   S/P transmetatarsal amputation of foot, left ([place]) 04/18/2022   CKD (chronic kidney disease) stage 5, GFR less than 15 ml/min ([place]) 04/18/2022   Secondary hyperparathyroidism of renal origin ([place]) 04/10/2022   Pruritus, unspecified 04/10/2022   Nausea 04/10/2022   Iron deficiency anemia, unspecified 04/10/2022   Hypothyroidism, unspecified 04/10/2022   Other acute kidney failure ([place]) 04/10/2022   Dyspnea, unspecified 65/99/3570   Eosinophilic esophagitis 17/79/3903   Gastric ulcer, unspecified as acute or chronic, without hemorrhage or perforation 04/10/2022   Coagulation defect, unspecified ([place]) 04/10/2022   Allergy, unspecified, initial encounter 04/10/2022   Anaphylactic shock, unspecified, initial encounter 04/10/2022   Arthralgia of right lower leg    Acute blood loss anemia    Unilateral complete BKA, right, initial  encounter ([place]) 04/03/2022   S/P BKA (below knee amputation), right ([place]) 04/03/2022   Old myocardial infarction 04/02/2022   Dependence on renal dialysis ([place]) 04/02/2022   Acquired absence of right leg below knee ([place]) 04/02/2022   Cirrhosis ([place]) 03/31/2022   Fournier's gangrene in male    Scrotal infection 03/25/2022   Anemia 03/25/2022   Diarrhea 03/25/2022   Shock liver 03/25/2022   Acute respiratory failure with hypoxia ([place]) 03/25/2022   Gout 03/25/2022   Acute nonintractable headache 03/25/2022   Demand ischemia 03/25/2022   Thrombocytopenia ([place]) 03/25/2022   Obesity, Class III, BMI 40-49.9 (morbid obesity) ([place]) 03/25/2022   Cardiac arrest ([place]) 03/25/2022   Hypotension    Respiratory insufficiency    Cellulitis    Malnutrition of moderate degree 03/12/2022   Altered mental status    Gangrene of right foot ([place])    Gangrene of left foot ([place])    Severe protein-calorie malnutrition ([place]) 03/10/2022   Colon cancer screening    Pressure injury of skin  02/23/2022   Septic shock ([place]) 02/22/2022   AKI (acute kidney injury) ([place])    Generalized abdominal pain    Lactic acidosis    Atrial fibrillation with RVR ([name])    Hypokalemia    Hypomagnesemia    Pain in finger of right hand 08/14/2021   Degenerative joint disease of shoulder region 05/07/2020   Olecranon bursitis of right elbow 05/07/2020   Pain in joint of right shoulder 04/26/2020   S/P TKR (total knee replacement), bilateral 11/04/2018   Bilateral primary osteoarthritis of knee    DDD (degenerative disc disease), cervical 01/10/2018   Rupture of medial head of gastrocnemius, left, sequela 01/14/2017   Kidney stones 06/22/2016   Essential hypertension 08/01/2010   DYSTHYMIC DISORDER 07/08/2010   PALPITATIONS 07/08/2010    REFERRING DIAG: Z89.511 (ICD-10-CM) - Hx of below knee amputation, right   ONSET DATE: 06/19/2022 Prosthesis delivery  THERAPY DIAG:  Other abnormalities of gait and  mobility  Unsteadiness on feet  Muscle weakness (generalized)  Foot drop, left  Stiffness of left ankle, not elsewhere classified  Pain in right lower leg  Non-pressure chronic ulcer of skin of other sites with other specified severity ([place])  Rationale for Evaluation and Treatment Rehabilitation  PERTINENT HISTORY: Rt BKA 03/15/22, Left MTA 03/15/2022, bil. TKA 11/04/18, ESRD / chronic kidney disease st5 dialysis Tues/Thurs/Sat, cirrhosis, cardiac arrest May 23 post intubation, A-Fib  PRECAUTIONS: Fall, BP only in RUE starting 10/6  SUBJECTIVE:   He notices pain in distal limb at end of day.  He saw Richardson Landry, [facility] twice since last PT for adjustments including window over tibia. It feels better.  Pt denies falls. He is doing exercises "a little bit."  PAIN:  Are you having pain? Left Distal residual limb  0/10 with standing & gait  Right foot distal limb seated 0-2/10 and standing/walking "does not notice it." Focused on balance.   OBJECTIVE: (objective measures completed at initial evaluation unless otherwise dated)    COGNITION: 06/22/2022  Overall cognitive status: Within functional limits for tasks assessed and pt had flat affect so unable to fully assess cognition.   POSTURE: 06/22/2022  rounded shoulders, forward head, flexed trunk , and weight shift left   LOWER EXTREMITY ROM:   ROM P:passive  A:active Right Eval 06/22/2022 Left Eval 06/22/2022 Right 11/11/22 Left 11/11/22  Hip flexion        Hip extension Standing A: ~30* limitation Standing A: ~30* limitation Standing A: -15* Standing A: -5*  Hip abduction        Hip adduction        Hip internal rotation        Hip external rotation        Knee flexion        Knee extension Standing A: ~15* limitation Standing A: ~15* limitation Standing A: -5* Standing A: -2*  Ankle dorsiflexion   Standing A: ~25* limitation  Seated A: -18*  Ankle plantarflexion        Ankle inversion        Ankle eversion          (Blank rows = not tested)   LOWER EXTREMITY MMT:   MMT Right Eval 06/22/2022 Left Eval 06/22/2022 Right 11/11/22 Left  11/11/22  Hip flexion 4/5 4/5    Hip extension 2/5 functional 2/5 functional Standing 4/5 Standing 4/5  Hip abduction 2/5 functional 2/5 functional Standing  4/5 Standing 4/5  Hip adduction        Hip internal rotation  Hip external rotation        Knee flexion        Knee extension 3-/5 functional 3-/5 functional 5/5 5/5  Ankle dorsiflexion  2-/5   3-/5 Limited  by ROM  Ankle plantarflexion        Ankle inversion        Ankle eversion        (Blank rows = not tested)   TRANSFERS: 06/22/2022  Sit to stand: Mod A and from 22" w/c requiring armrests to push & RW to stabilize 06/22/2022  Stand to sit: Min A from RW to 22" w/c unable to release RW to reach for w/c until seated.   GAIT: 12/23/2022: 3 Min Walk Test: 45' with cane no LOB Gait Velocity 2.25 ft/sec  11/11/2022: Resting SpO2 99% HR 86 3 Minute Walk Test 402' with cane with SBA Gait Pattern: RLE abduction, decreased stance duration RLE with decreased step length LLE, step through pattern, flexed trunk, LLE foot drop After gait SpO2 99% HR 50 no symptoms of light-headed or ShOB  Gait Velocity 2.07 ft/sec with cane  Pt neg 12* ramp & 6" curb with cane & TTA prosthesis with CGA.  06/22/2022 Gait pattern: step to pattern, decreased step length- Left, decreased stance time- Right, decreased stride length, decreased hip/knee flexion- Right, circumduction- Right, Right hip hike, knee flexed in stance- Right, knee flexed in stance- Left, antalgic, trunk flexed, abducted- Right, and poor foot clearance- Left excessive weightbearing BUEs on RW Distance walked: 5' Assistive device utilized: Environmental consultant - 2 wheeled and right TTA prosthesis / left plate & shoe filler Level of assistance: Mod A Comments: post gait HR 82, SpO2 99% with dyspnea noted   FUNCTIONAL TESTs:  12/23/2022: TUG with cane 13.48sec;   cog 15.47sec cane;  11/11/2022: Berg Balance 14/56 Timed Up & Go: with cane std 18.50 sec with CGA (time >13.5sec indicates high fall risk)  and  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     CURRENT PROSTHETIC WEAR ASSESSMENT: 11/11/2022: Pt verbalizes proper, independent prosthetic care including donning prosthesis with suction sleeve suspension, skin check, residual limb care, care of non-amputated limb, prosthetic cleaning, ply sock cleaning, correct ply sock adjustment, proper wear schedule/adjustment, and proper weight-bearing schedule/adjustment. Pt reports wear >90% of awake hours with no skin issues or limb pain.    06/22/2022 Patient is dependent with: skin check, residual limb care, care of non-amputated limb, prosthetic cleaning, ply sock cleaning, correct ply sock adjustment, proper wear schedule/adjustment, and proper weight-bearing schedule/adjustment Donning prosthesis: Total A and max cues required, Doffing prosthesis: Total A and wife able to doffe Prosthetic wear tolerance: 1-2 hours 1-2x/day, for 3 of 3 days since delivery.  Prosthetic weight bearing tolerance: 0 minutes unable to stand outside of PT Edema: pitting with >5sec capillary refill Residual limb condition: 30m wound with serous drainage & black scab present,  calf measurement is 42.2cm Prosthetic description:  silicon liner with pin suspension, flexible inner socket Total Contact design, dynamic response foot with hydraulic PF/DF K code/activity level with prosthetic use: Level 3     TODAY'S TREATMENT:  12/23/2022: Prosthetic Training with TTA prosthesis: PT recommended setting timer ~30 min when working to remind him to stand.  He should stand with some stretches for  LEs & back  for 2-5 min to limit stiffness. Pt verbalized understanding. Night pain can be limb volume change especially with kidney issues.  Probability is shrinkage needing 1 to 2-ply adjustment. PT recommended trying to add 1-ply sock for 1-2 nights.  If sock addition makes pain worse, then he is probably swelling with dependent position sitting. So remove 1 to 2-ply fit. Pt & wife verbalized understanding.   See TUG & Gait in objective section. Pt amb with cane naming automotive items A-Z with no LOB but had difficulty with cognitive task that probably would not be difficult if done sitting.  PT recommending as HEP. Pt verbalized understanding.  Pt neg ramp & curb with cane with supervision / verbal cues for technique and without device with min/CGA.  PT instructed in practice inclines with rail on his ramp close and curb with nose of car over edge for contact as needed. Pt & wife verbalized understanding.    12/09/2022: Prosthetic Training with TTA prosthesis: PT educated pt & wife on concerns with nocturnal hypoxia vs problem solving thru CPAP that has been recommended.  PT educated on correlation to function during day. Both verbalized understanding.   Worked on functional balance donning & doffing jacket standing in front of chair & counter/ support in front for safety.  Pt verbalized how to work on this task at home with safe set up.   PT educated verbally on travel recommendations with prosthesis. Fall risk is higher when prosthesis is off.  At home he uses w/c if needs to toilet in middle of night (mainly for urination).  PT recommending taking a urinal to travel so can urinate in timely manner and donne prosthesis to empty urinal.  Pt & wife verbalized understanding.    Pt performed the following activities as addition for HEP. Wife demo safe assistance with 1 & 2. Pt verbalized & return demo understanding of all.   HEP  12/09/2022  1.pick up speed with longer & quicker steps. 2. Walk with eyes  closed alternating eyes open 3. Near counter work on standing up & walking without hesitancy 4. Turn around towards counter working on decreasing number of steps. 5. Walk with head turns on X & T  goal is maintain path & pace.  12/02/2022: Neuromuscular Re-ed: Reviewed HEP in corner: hip width stance head motions 4 directions (right/left, up/down & diagonals) -  on floor eyes open,  on floor eyes closed with RUE single finger support & on foam eyes open with RUE single finger support.  PT noted slight improvement in right/left & up/down but no change in diagonals.  Pt & wife report he has not been doing the diagonal motions & have not found compliant surface yet.  Also performed static stance eyes closed on floor & on foam.  He loosed balance on floor after ~3sec & on foam ~1sec. Standing in corner with BUE light touch on chair back:  alternating kicking LEs 10 reps ea with need to pause for restablization - flexion, abd & ext.  PT added to HEP including HO. Pt & wife verbalized understanding. PT recommending performing stretches daily & other exercises on variable types minimum of 3days/wk.  Pt & wife verbalized understanding.  Prosthetic Training with TTA prosthesis: Prosthesis appears 1/4" short using lifts in standing for level pelvis. Pt to make appt with prosthetist.    HOME EXERCISE PROGRAM: 1.pick up speed with longer & quicker steps. 2. Walk with eyes closed alternating eyes open 3. Near counter  work on standing up & walking without hesitancy 4. Turn around towards counter working on decreasing number of steps. 5. Walk with head turns on X & T  goal is maintain path & pace.  Access Code: [email or web address] URL: [email or web address] Date: 12/02/2022 Prepared by: Jamey Reas  Exercises - Standing Gastroc Stretch at Counter  - 1-3 x daily - 7 x weekly - 1 sets - 2-3 reps - 20-30 seconds hold - Standing Ankle Dorsiflexion Stretch  - 1-3 x daily - 7 x weekly - 1 sets - 2-3 reps  - 20-30 seconds hold - Gastroc Stretch on Step  - 1-3 x daily - 7 x weekly - 1 sets - 2-3 reps - 20-30 seconds hold - Standing Soleus Stretch on Step with Counter Support  - 1-3 x daily - 7 x weekly - 1 sets - 2-3 reps - 20-30 seconds hold - Seated Calf Stretch with Strap  - 1-3 x daily - 7 x weekly - 1 sets - 2-3 reps - 20-30 seconds hold - Seated Soleus Stretch with Strap  - 1-3 x daily - 7 x weekly - 1 sets - 2-3 reps - 20-30 seconds hold - wide stance head motions eyes open  - 1 x daily - 3-5 x weekly - 1 sets - 10 reps - 2 seconds hold - Wide Stance with Eyes Closed  - 1 x daily - 3-5 x weekly - 1 sets - 10 reps - 10 seconds hold - Feet Apart with Eyes Closed with Head Motions  - 1 x daily - 3-5 x weekly - 1 sets - 10 reps - 2 seconds hold - Wide stance on Foam Pad head movements  - 1 x daily - 3-5 x weekly - 1 sets - 10 reps - 2 seconds hold - Supine Cervical Retraction with Towel  - 1 x daily - 7 x weekly - 1 sets - 10 reps - 5 seconds hold - Cervical Retraction with Overpressure  - 1 x daily - 7 x weekly - 1 sets - 10 reps - 5 seconds hold - Supine Scapular Retraction  - 1 x daily - 7 x weekly - 1 sets - 10 reps - 5 seconds hold - Seated Scapular Retraction  - 1 x daily - 3-5 x weekly - 1 sets - 10 reps - 5 seconds hold - Alternating Hip Flexion Forward Kicks  - 1 x daily - 3-5 x weekly - 1 sets - 10 reps - Standing hip abduction alternating legs  - 1 x daily - 3-5 x weekly - 1 sets - 10 reps - Standing hip extension alternating legs  - 1 x daily - 3-5 x weekly - 1 sets - 10 reps   AT SINK FIND YOUR MIDLINE POSITION AND PLACE FEET EQUAL DISTANCE FROM THE MIDLINE.  Try to find this position when standing still for activities.   USE TAPE ON FLOOR TO MARK THE MIDLINE POSITION which is even with middle of sink.  You also should try to feel with your limb pressure in socket.  You are trying to feel with limb what you used to feel with the bottom of your foot.  Side to Side Shift: Moving  your hips only (not shoulders): move weight onto your left leg, HOLD/FEEL pressure in socket.  Move back to equal weight on each leg, HOLD/FEEL pressure in socket. Move weight onto your right leg, HOLD/FEEL pressure in socket. Move back to equal weight on each leg, HOLD/FEEL pressure in socket. Repeat.  Start with both hands on sink, progress to hand on prosthetic side only, then no hands.  Front to Back Shift: Moving your hips only (not shoulders): move your weight forward onto your toes, HOLD/FEEL pressure in socket. Move your weight back to equal Flat Foot on both legs, HOLD/FEEL  pressure in socket. Move your weight back onto your heels, HOLD/FEEL  pressure in socket. Move your weight back to equal on both legs, HOLD/FEEL  pressure in socket. Repeat.  Start with both hands on sink, progress to hand on prosthetic side only, then no hands.  Moving Cones / Cups: With equal weight on each leg: Hold on with one hand the first time, then progress to no hand supports. Move cups from one side of sink to the other. Place cups ~2" out of your reach, progress to 10" beyond reach.  Place one hand in middle of sink and reach with other hand. Do both arms.  Then hover one hand and move cups with other hand.  Overhead/Upward Reaching: alternated reaching up to top cabinets or ceiling if no cabinets present. Keep equal weight on each leg. Start with one hand support on counter while other hand reaches and progress to no hand support with reaching.  ace one hand in middle of sink and reach with other hand. Do both arms.  Then hover one hand and move cups with other hand.  5.   Looking Over Shoulders: With equal weight on each leg: alternate turning to look over your shoulders with one hand support on counter as needed.  Start with head motions only to look in front of shoulder, then even with shoulder and progress to looking behind you. To look to side, move head /eyes, then shoulder on side looking pulls back, shift more  weight to side looking and pull hip back. Place one hand in middle of sink and let go with other hand so your shoulder can pull back. Switch hands to look other way.   Then hover one hand and look over shoulder. If looking right, use left hand at sink. If looking left, use right hand at sink. 6.  Stepping with leg that is not amputated:  Move items under cabinet out of your way. Shift your hips/pelvis so weight on prosthesis. Tighten muscles in hip on prosthetic side.  SLOWLY step other leg so front of foot is in cabinet. Then step back to floor.   Pt also recommending sitting at edge of w/c with feet supported on floor but no back support each hour in w/c until back fatigues. PT also demo & instructed in sitting on 24" stool. Pt & wife verbalized understanding.     ASSESSMENT: CLINICAL IMPRESSION: Pt met STGs set.  He is able to walk longer distances with less assistance and TUG indicates lower fall risk.   Pt continues to benefit from PT to progress function & safety.    OBJECTIVE IMPAIRMENTS Abnormal gait, cardiopulmonary status limiting activity, decreased activity tolerance, decreased balance, decreased endurance, decreased knowledge of condition, decreased knowledge of use of DME, decreased mobility, difficulty walking, decreased ROM, decreased strength, increased edema, impaired flexibility, postural dysfunction, and prosthetic dependency .    ACTIVITY LIMITATIONS carrying, lifting, bending, sitting, standing, squatting, stairs, transfers, locomotion level, and prosthesis use   PARTICIPATION LIMITATIONS: driving, community activity, occupation, yard work, and recreational of camping   [place], Past/current experiences, Time since onset of injury/illness/exacerbation, and 3+ comorbidities: see PMH  are also affecting patient's functional outcome.  REHAB POTENTIAL: Good   CLINICAL DECISION MAKING: Evolving/moderate complexity   EVALUATION COMPLEXITY: Moderate      GOALS: Goals reviewed with patient? Yes   SHORT TERM GOALS: Target date: 4 visits after re-eval (visit number 24)   Timed Up & Go with cane <15 sec.  Goal status: Set 11/11/2022 2.  Patient demo & verbalizes understanding of updated HEP.  Goal status: Set 11/11/2022   3.  pt amb >500' with cane & prosthesis in 3 minutes with no LOB. Goal status: Set 11/11/2022   UPDATED LONG TERM GOALS: Target date: 04/22/2023   Patient demonstrates & verbalized understanding of ongoing HEP / fitness plan. Goal status: set 11/11/2022  2.   Patient Timed Up & Go <13.5 sec standard & <15sec cognitive Goal status: set 11/11/2022   3.   Berg Balance >/= 36/56 to indicate lower fall risk Goal status: Ongoing 11/11/2022   4.   Patient ambulates >500' with prosthesis & cane or less independently Goal status:Ongoing 08/24/2022   5.   Patient negotiates ramps, curbs & stairs with single rail with prosthesis & cane or less independently. Goal status: Ongoing 08/24/2022     PLAN: PT FREQUENCY: 1x every 1-2 weeks for 15 visits    PT DURATION: 15 visits over 5 months   PLANNED INTERVENTIONS: Therapeutic exercises, Therapeutic activity, Neuromuscular re-education, Balance training, Gait training, Patient/Family education, Self Care, Stair training, Vestibular training, Canalith repositioning, Prosthetic training, DME instructions, Vasopneumatic device, and physical performance testing   PLAN FOR NEXT SESSION:   set updated STGs, continue to progress balance & mobility safety.    Jamey Reas, PT, DPT 12/23/2022, 12:22 PM

## 2022-12-31 ENCOUNTER — Encounter: Payer: Self-pay | Admitting: Orthopedic Surgery

## 2022-12-31 ENCOUNTER — Ambulatory Visit (INDEPENDENT_AMBULATORY_CARE_PROVIDER_SITE_OTHER): Payer: BC Managed Care – PPO | Admitting: Orthopedic Surgery

## 2022-12-31 DIAGNOSIS — Z89432 Acquired absence of left foot: Secondary | ICD-10-CM

## 2022-12-31 DIAGNOSIS — Z89511 Acquired absence of right leg below knee: Secondary | ICD-10-CM

## 2022-12-31 NOTE — Progress Notes (Signed)
Office Visit Note   Patient: Jeffrey Ayala           Date of Birth: 09/22/1961           MRN: [id number] Visit Date: 12/31/2022              Requested by: [name], NP-C [place],  [place] [address] PCP: [name], NP-C  Chief Complaint  Patient presents with   Right Leg - Follow-up    Hx bka   Left Foot - Follow-up    Hx transmet amputation      HPI: Patient is a 62 year old gentleman who presents for evaluation for right transtibial amputation and left transmetatarsal amputation.  Patient is wearing increased ply socks on the right has had extensive modifications to the socket and still has pain with weightbearing.  Patient states the inserts on the left foot are worn out and needs new protective inserts.  Assessment & Plan: Visit Diagnoses:  1. Hx of below knee amputation, right ([place])   2. History of transmetatarsal amputation of left foot ([name])     Plan: A prescription was provided for new socket liner materials and supplies for the right transtibial amputation and a prescription for new custom orthotic spacer and carbon plate for the left transmetatarsal amputation.  Follow-Up Instructions: Return if symptoms worsen or fail to improve.   Ortho Exam  Patient is alert, oriented, no adenopathy, well-dressed, normal affect, normal respiratory effort. Examination patient has areas of skin breakdown on the right below-knee amputation there is redness on the patella from subsidence into the socket there is no ulcers on the end of the residual limb.  He has good hair growth there is no cellulitis no signs of infection.  Patient has no breakdown of the left transmetatarsal amputation.  Patient is an existing right transtibial  amputee.  Patient's current comorbidities are not expected to impact the ability to function with the prescribed prosthesis. Patient verbally communicates a strong desire to use a prosthesis. Patient currently requires  mobility aids to ambulate without a prosthesis.  Expects not to use mobility aids with a new prosthesis.  Patient is a K2 level ambulator that will use a prosthesis to walk around their home and the community over low level environmental barriers.      Imaging: No results found. No images are attached to the encounter.  Labs: Lab Results  Component Value Date   HGBA1C 5.5 02/23/2022   ESRSEDRATE 20 (H) 02/22/2022   CRP 13.8 (H) 02/23/2022   LABURIC 8.5 (H) 05/27/2022   LABURIC 6.3 11/14/2018   LABURIC 7.4 04/10/2014   REPTSTATUS 04/05/2022 FINAL 03/16/2022   GRAMSTAIN  03/16/2022    NO WBC SEEN ABUNDANT GRAM POSITIVE COCCI FEW GRAM NEGATIVE RODS    CULT  03/16/2022    ABUNDANT ENTEROCOCCUS FAECALIS FEW PSEUDOMONAS AERUGINOSA MULTI-DRUG RESISTANT ORGANISM ABUNDANT BACTEROIDES OVATUS BETA LACTAMASE POSITIVE CRITICAL RESULT CALLED TO, READ BACK BY AND VERIFIED WITH: PHARMD K PIERCE [id number] AT 1019 BY CM Sent to [facility] for further susceptibility testing. SEE REPORT IN EPIC Performed at [facility], [place] [address], [place],  [address]    LABORGA ENTEROCOCCUS FAECALIS 03/16/2022   LABORGA PSEUDOMONAS AERUGINOSA 03/16/2022     Lab Results  Component Value Date   ALBUMIN 4.0 12/15/2022   ALBUMIN 4.0 11/17/2022   ALBUMIN 3.5 08/15/2022    Lab Results  Component Value Date   MG 1.9 04/18/2022   MG 2.1  04/01/2022   MG 2.0 03/31/2022   No results found for: "VD25OH"  No results found for: "PREALBUMIN"    Latest Ref Rng & Units 12/15/2022    8:48 AM 12/15/2022    8:40 AM 11/20/2022    9:07 AM  CBC EXTENDED  WBC 4.0 - 10.5 K/uL  8.4    RBC 4.22 - 5.81 MIL/uL  3.40    Hemoglobin 13.0 - 17.0 g/dL 10.9  11.3  11.2   HCT 39.0 - 52.0 %  33.1  33.0   Platelets 150 - 400 K/uL  213    NEUT# 1.7 - 7.7 K/uL  6.6    Lymph# 0.7 - 4.0 K/uL  1.2       There is no height or weight on file to calculate BMI.  Orders:  No orders of the defined types were  placed in this encounter.  No orders of the defined types were placed in this encounter.    Procedures: No procedures performed  Clinical Data: No additional findings.  ROS:  All other systems negative, except as noted in the HPI. Review of Systems  Objective: Vital Signs: There were no vitals taken for this visit.  Specialty Comments:  No specialty comments available.  PMFS History: Patient Active Problem List   Diagnosis Date Noted   OSA (obstructive sleep apnea) 11/08/2022   Benign neoplasm of colon 10/26/2022   Gastric polyp 10/26/2022   Suspected sleep apnea 10/01/2022   Daytime somnolence 10/01/2022   Snoring 10/01/2022   Acute bilateral obstructive uropathy 08/15/2022   Skin breakdown 07/29/2022   Suicidal ideations 06/09/2022   Chills (without fever) 06/09/2022   Severe depression ([place]) 06/09/2022   Acute cough 06/09/2022   End stage renal disease ([place]) 06/02/2022   Reactive depression 05/31/2022   Belching 05/27/2022   Gastroesophageal reflux disease 05/27/2022   Atrial fibrillation ([place]) 05/20/2022   Long term (current) use of anticoagulants 05/20/2022   S/P transmetatarsal amputation of foot, left ([place]) 04/18/2022   CKD (chronic kidney disease) stage 5, GFR less than 15 ml/min ([place]) 04/18/2022   Secondary hyperparathyroidism of renal origin ([place]) 04/10/2022   Pruritus, unspecified 04/10/2022   Nausea 04/10/2022   Iron deficiency anemia, unspecified 04/10/2022   Hypothyroidism, unspecified 04/10/2022   Other acute kidney failure ([place]) 04/10/2022   Dyspnea, unspecified 76/81/1572   Eosinophilic esophagitis 62/01/5596   Gastric ulcer, unspecified as acute or chronic, without hemorrhage or perforation 04/10/2022   Coagulation defect, unspecified ([place]) 04/10/2022   Allergy, unspecified, initial encounter 04/10/2022   Anaphylactic shock, unspecified, initial encounter 04/10/2022   Arthralgia of right lower leg    Acute blood loss anemia    Unilateral  complete BKA, right, initial encounter ([place]) 04/03/2022   S/P BKA (below knee amputation), right ([place]) 04/03/2022   Old myocardial infarction 04/02/2022   Dependence on renal dialysis ([place]) 04/02/2022   Acquired absence of right leg below knee ([place]) 04/02/2022   Cirrhosis ([place]) 03/31/2022   Fournier's gangrene in male    Scrotal infection 03/25/2022   Anemia 03/25/2022   Diarrhea 03/25/2022   Shock liver 03/25/2022   Acute respiratory failure with hypoxia ([place]) 03/25/2022   Gout 03/25/2022   Acute nonintractable headache 03/25/2022   Demand ischemia 03/25/2022   Thrombocytopenia ([place]) 03/25/2022   Obesity, Class III, BMI 40-49.9 (morbid obesity) ([place]) 03/25/2022   Cardiac arrest ([place]) 03/25/2022   Hypotension    Respiratory insufficiency    Cellulitis    Malnutrition of moderate degree 03/12/2022   Altered mental  status    Gangrene of right foot ([name])    Gangrene of left foot ([name])    Severe protein-calorie malnutrition ([place]) 03/10/2022   Colon cancer screening    Pressure injury of skin 02/23/2022   Septic shock ([place]) 02/22/2022   AKI (acute kidney injury) ([place])    Generalized abdominal pain    Lactic acidosis    Atrial fibrillation with RVR ([name])    Hypokalemia    Hypomagnesemia    Pain in finger of right hand 08/14/2021   Degenerative joint disease of shoulder region 05/07/2020   Olecranon bursitis of right elbow 05/07/2020   Pain in joint of right shoulder 04/26/2020   S/P TKR (total knee replacement), bilateral 11/04/2018   Bilateral primary osteoarthritis of knee    DDD (degenerative disc disease), cervical 01/10/2018   Rupture of medial head of gastrocnemius, left, sequela 01/14/2017   Kidney stones 06/22/2016   Essential hypertension 08/01/2010   DYSTHYMIC DISORDER 07/08/2010   PALPITATIONS 07/08/2010   Past Medical History:  Diagnosis Date   A-fib [facility])    Acute nonintractable headache 03/25/2022   Anemia    Arthritis    Atrial fibrillation, chronic  ([name])    not on [facility]   Cardiac arrest ([place]) 02/2022   in the setting of septic shock   Cirrhosis of liver ([name])    Concussion    Dysrhythmia    afib   Eosinophilic esophagitis    EGD/bx by Dr. 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    Family History  Problem Relation Age of Onset   Arrhythmia Mother    COPD Mother    Sarcoidosis Brother    Cancer Brother    Stomach cancer Neg Hx    Pancreatic cancer Neg Hx    Esophageal cancer Neg Hx    Colon cancer Neg Hx    Rectal cancer Neg Hx     Past Surgical History:  Procedure Laterality Date   AMPUTATION Right 03/15/2022   Procedure: RIGHT BELOW KNEE AMPUTATION;  Surgeon: [name], MD;  Location: [facility];  Service: Orthopedics;  Laterality: Right;   AV FISTULA PLACEMENT Left 08/28/2022   Procedure: LEFT ARM ARTERIOVENOUS (AV) FISTULA CREATION;  Surgeon: Waynetta Sandy, MD;  Location: [facility];  Service: Vascular;  Laterality: Left;   BIOPSY  10/26/2022   Procedure: BIOPSY;  Surgeon: Yetta Flock, MD;  Location: [facility];  Service: Gastroenterology;;   CARDIOVERSION N/A 11/20/2022   Procedure: CARDIOVERSION;  Surgeon: Freada Bergeron, MD;  Location: [facility] ENDOSCOPY;  Service: Cardiovascular;  Laterality: N/A;   COLONOSCOPY WITH PROPOFOL N/A 10/26/2022   Procedure: COLONOSCOPY WITH PROPOFOL;  Surgeon: Yetta Flock, MD;  Location: [facility];  Service: Gastroenterology;  Laterality: N/A;   CYSTOSCOPY     CYSTOSCOPY WITH STENT PLACEMENT Bilateral 08/15/2022   Procedure: CYSTOSCOPY WITH STENT PLACEMENT;  Surgeon: Remi Haggard, MD;  Location: [facility];  Service: Urology;  Laterality: Bilateral;   CYSTOSCOPY/URETEROSCOPY/HOLMIUM LASER/STENT PLACEMENT Bilateral 09/14/2022   Procedure:  CYSTOSCOPY/URETEROSCOPY/HOLMIUM LASER/STENT PLACEMENT;  Surgeon: Raynelle Bring, MD;  Location: [facility];  Service: Urology;  Laterality: Bilateral;   DENTAL SURGERY     ESOPHAGOGASTRODUODENOSCOPY (EGD) WITH PROPOFOL N/A 10/26/2022   Procedure:  ESOPHAGOGASTRODUODENOSCOPY (EGD) WITH PROPOFOL;  Surgeon: Yetta Flock, MD;  Location: [facility];  Service: Gastroenterology;  Laterality: N/A;   FINGER SURGERY     HERNIA REPAIR     at age 74   IR FLUORO GUIDE CV LINE RIGHT  03/09/2022   IR PATIENT EVAL TECH 0-60 MINS  03/09/2022   IR US GUIDE VASC ACCESS RIGHT  03/09/2022   KNEE ARTHROSCOPY Left    LITHOTRIPSY     NEPHROLITHOTOMY     POLYPECTOMY  10/26/2022   Procedure: POLYPECTOMY;  Surgeon: Yetta Flock, MD;  Location: [facility];  Service: Gastroenterology;;   Carolynn Serve EXPLORATION N/A 03/16/2022   Procedure: IRRIGATION AND DEBRIDEMENT SCROTUM;  Surgeon: Raynelle Bring, MD;  Location: [facility];  Service: Urology;  Laterality: N/A;   SCROTAL EXPLORATION N/A 03/28/2022   Procedure: IRRIGATION AND DEBRIDEMENT SCROTUM;  Surgeon: Ardis Hughs, MD;  Location: [facility];  Service: Urology;  Laterality: N/A;   TONSILLECTOMY     TOTAL KNEE ARTHROPLASTY Bilateral 11/04/2018   Procedure: BILATERAL TOTAL KNEE ARTHROPLASTY;  Surgeon: [name], MD;  Location: [facility];  Service: Orthopedics;  Laterality: Bilateral;  spinal/epidural per anesthesiologist   TRANSMETATARSAL AMPUTATION Left 03/15/2022   Procedure: LEFT TRANSMETATARSAL AMPUTATION;  Surgeon: [name], MD;  Location: [facility];  Service: Orthopedics;  Laterality: Left;   Social History   Occupational History   Occupation: Airline pilot  Tobacco Use   Smoking status: Never   Smokeless tobacco: Never  Vaping Use   Vaping Use: Never used  Substance and Sexual Activity   Alcohol use: Not Currently    Comment: occassional   Drug use: No   Sexual activity: Not Currently

## 2023-01-04 ENCOUNTER — Encounter: Payer: Self-pay | Admitting: Physical Therapy

## 2023-01-04 ENCOUNTER — Ambulatory Visit (INDEPENDENT_AMBULATORY_CARE_PROVIDER_SITE_OTHER): Payer: BC Managed Care – PPO | Admitting: Physical Therapy

## 2023-01-04 DIAGNOSIS — M6281 Muscle weakness (generalized): Secondary | ICD-10-CM | POA: Diagnosis not present

## 2023-01-04 DIAGNOSIS — R2681 Unsteadiness on feet: Secondary | ICD-10-CM

## 2023-01-04 DIAGNOSIS — R2689 Other abnormalities of gait and mobility: Secondary | ICD-10-CM | POA: Diagnosis not present

## 2023-01-04 DIAGNOSIS — M21372 Foot drop, left foot: Secondary | ICD-10-CM | POA: Diagnosis not present

## 2023-01-04 NOTE — Therapy (Addendum)
PHYSICAL THERAPY DISCHARGE SUMMARY  Visits from Start of Care: 25  Current functional level related to goals / functional outcomes: Patient was on hold while getting a new prosthetic socket.  Jeffrey Ayala has not been seen since 01/04/2023.  Therefore PT is discharging.    Remaining deficits: See below for last known functional level.    Education / Equipment: Patient was educated in prosthetic care & HEP and appeared to understand at last PT appt.    Patient agrees to discharge. Patient goals were  unknown . Patient is being discharged due to not returning since the last visit.  Vladimir Faster, PT, DPT PT Specializing in Prosthetics & Orthotics 06/14/23 8:54 AM Phone:  [phone]  Fax:  [phone] [facility] [address] [place], [place] [address]   OUTPATIENT PHYSICAL THERAPY TREATMENT NOTE   Patient Name: Jeffrey Ayala MRN: [id number] DOB:Nov 07, 1961, 62 y.o., male Today's Date: 01/04/2023  PCP: Avanell Shackleton, NP-C REFERRING PROVIDER: Nadara Mustard, MD   END OF SESSION:   PT End of Session - 01/04/23 1257     Visit Number 25    Number of Visits 35    Date for PT Re-Evaluation 04/22/23    Authorization Type [employer] comm PPO    Authorization Time Period 20 visit limit PT/OT/chiro, $35 co-pay    Authorization - Number of Visits 31    Progress Note Due on Visit 30    PT Start Time 1257    PT Stop Time 1342    PT Time Calculation (min) 45 min    Equipment Utilized During Treatment Gait belt    Activity Tolerance Patient tolerated treatment well;Patient limited by fatigue    Behavior During Therapy WFL for tasks assessed/performed                          Past Medical History:  Diagnosis Date   A-fib ([name])    Acute nonintractable headache 03/25/2022   Anemia    Arthritis    Atrial fibrillation, chronic ([name])    not on [facility]   Cardiac arrest ([name]) 02/2022   in the setting of septic shock   Cirrhosis of liver ([name])     Concussion    Dysrhythmia    afib   Eosinophilic esophagitis    EGD/bx by Dr. 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   Past Surgical History:  Procedure Laterality Date   AMPUTATION Right 03/15/2022   Procedure: RIGHT BELOW KNEE AMPUTATION;  Surgeon: Nadara Mustard, MD;  Location: [facility] OR;  Service: Orthopedics;  Laterality: Right;   AV FISTULA PLACEMENT Left 08/28/2022   Procedure: LEFT ARM ARTERIOVENOUS (AV) FISTULA CREATION;  Surgeon: Maeola Harman, MD;  Location: [facility] OR;  Service: Vascular;  Laterality: Left;   BIOPSY  10/26/2022   Procedure: BIOPSY;  Surgeon: Benancio Deeds, MD;  Location: [facility] ENDOSCOPY;  Service: Gastroenterology;;   CARDIOVERSION N/A 11/20/2022   Procedure: CARDIOVERSION;  Surgeon: Meriam Sprague, MD;  Location: [facility] ENDOSCOPY;  Service: Cardiovascular;  Laterality: N/A;   COLONOSCOPY WITH PROPOFOL N/A 10/26/2022   Procedure: COLONOSCOPY WITH PROPOFOL;  Surgeon: Benancio Deeds, MD;  Location: [facility] ENDOSCOPY;  Service: Gastroenterology;  Laterality: N/A;   CYSTOSCOPY     CYSTOSCOPY WITH STENT PLACEMENT Bilateral 08/15/2022   Procedure: CYSTOSCOPY WITH STENT PLACEMENT;  Surgeon: Belva Agee, MD;  Location: [facility] OR;  Service: Urology;  Laterality: Bilateral;   CYSTOSCOPY/URETEROSCOPY/HOLMIUM LASER/STENT PLACEMENT Bilateral 09/14/2022   Procedure: CYSTOSCOPY/URETEROSCOPY/HOLMIUM LASER/STENT PLACEMENT;  Surgeon: Heloise Purpura, MD;  Location: [facility] ORS;  Service: Urology;  Laterality: Bilateral;   DENTAL SURGERY     ESOPHAGOGASTRODUODENOSCOPY (EGD) WITH PROPOFOL N/A 10/26/2022   Procedure: ESOPHAGOGASTRODUODENOSCOPY (EGD) WITH PROPOFOL;  Surgeon: Benancio Deeds, MD;  Location: [facility] ENDOSCOPY;  Service: Gastroenterology;   Laterality: N/A;   FINGER SURGERY     HERNIA REPAIR     at age 38   IR FLUORO GUIDE CV LINE RIGHT  03/09/2022   IR PATIENT EVAL TECH 0-60 MINS  03/09/2022   IR US GUIDE VASC ACCESS RIGHT  03/09/2022   KNEE ARTHROSCOPY Left    LITHOTRIPSY     NEPHROLITHOTOMY     POLYPECTOMY  10/26/2022   Procedure: POLYPECTOMY;  Surgeon: Benancio Deeds, MD;  Location: [facility] ENDOSCOPY;  Service: Gastroenterology;;   Karen Kays EXPLORATION N/A 03/16/2022   Procedure: IRRIGATION AND DEBRIDEMENT SCROTUM;  Surgeon: Heloise Purpura, MD;  Location: [facility] OR;  Service: Urology;  Laterality: N/A;   SCROTAL EXPLORATION N/A 03/28/2022   Procedure: IRRIGATION AND DEBRIDEMENT SCROTUM;  Surgeon: Crist Fat, MD;  Location: [facility] OR;  Service: Urology;  Laterality: N/A;   TONSILLECTOMY     TOTAL KNEE ARTHROPLASTY Bilateral 11/04/2018   Procedure: BILATERAL TOTAL KNEE ARTHROPLASTY;  Surgeon: Nadara Mustard, MD;  Location: [facility] OR;  Service: Orthopedics;  Laterality: Bilateral;  spinal/epidural per anesthesiologist   TRANSMETATARSAL AMPUTATION Left 03/15/2022   Procedure: LEFT TRANSMETATARSAL AMPUTATION;  Surgeon: Nadara Mustard, MD;  Location: [facility] OR;  Service: Orthopedics;  Laterality: Left;   Patient Active Problem List   Diagnosis Date Noted   OSA (obstructive sleep apnea) 11/08/2022   Benign neoplasm of colon 10/26/2022   Gastric polyp 10/26/2022   Suspected sleep apnea 10/01/2022   Daytime somnolence 10/01/2022   Snoring 10/01/2022   Acute bilateral obstructive uropathy 08/15/2022   Skin breakdown 07/29/2022   Suicidal ideations 06/09/2022   Chills (without fever) 06/09/2022   Severe depression ([name]) 06/09/2022   Acute cough 06/09/2022   End stage renal disease ([name]) 06/02/2022   Reactive depression 05/31/2022   Belching 05/27/2022   Gastroesophageal reflux disease 05/27/2022   Atrial fibrillation ([name]) 05/20/2022   Long term (current) use of anticoagulants 05/20/2022   S/P transmetatarsal amputation of foot, left  ([name]) 04/18/2022   CKD (chronic kidney disease) stage 5, GFR less than 15 ml/min ([name]) 04/18/2022   Secondary hyperparathyroidism of renal origin ([name]) 04/10/2022   Pruritus, unspecified 04/10/2022   Nausea 04/10/2022   Iron deficiency anemia, unspecified 04/10/2022   Hypothyroidism, unspecified 04/10/2022   Other acute kidney failure ([name]) 04/10/2022   Dyspnea, unspecified 04/10/2022   Eosinophilic esophagitis 04/10/2022   Gastric ulcer, unspecified as acute or chronic, without hemorrhage or perforation 04/10/2022   Coagulation defect, unspecified ([name]) 04/10/2022   Allergy, unspecified, initial encounter 04/10/2022   Anaphylactic shock, unspecified, initial encounter 04/10/2022   Arthralgia of right lower leg    Acute blood loss anemia    Unilateral complete BKA, right, initial encounter ([name]) 04/03/2022   S/P BKA (below knee amputation), right ([name]) 04/03/2022   Old myocardial infarction 04/02/2022   Dependence on renal dialysis ([name]) 04/02/2022   Acquired absence of right leg below  knee ([name]) 04/02/2022   Cirrhosis ([name]) 03/31/2022   Fournier's gangrene in male    Scrotal infection 03/25/2022   Anemia 03/25/2022   Diarrhea 03/25/2022   Shock liver 03/25/2022   Acute respiratory failure with hypoxia ([name]) 03/25/2022   Gout 03/25/2022   Acute nonintractable headache 03/25/2022   Demand ischemia 03/25/2022   Thrombocytopenia ([name]) 03/25/2022   Obesity, Class III, BMI 40-49.9 (morbid obesity) ([name]) 03/25/2022   Cardiac arrest ([name]) 03/25/2022   Hypotension    Respiratory insufficiency    Cellulitis    Malnutrition of moderate degree 03/12/2022   Altered mental status    Gangrene of right foot ([name])    Gangrene of left foot ([name])    Severe protein-calorie malnutrition ([name]) 03/10/2022   Colon cancer screening    Pressure injury of skin 02/23/2022   Septic shock ([name]) 02/22/2022   AKI (acute kidney injury) ([name])    Generalized abdominal pain    Lactic acidosis    Atrial  fibrillation with RVR ([name])    Hypokalemia    Hypomagnesemia    Pain in finger of right hand 08/14/2021   Degenerative joint disease of shoulder region 05/07/2020   Olecranon bursitis of right elbow 05/07/2020   Pain in joint of right shoulder 04/26/2020   S/P TKR (total knee replacement), bilateral 11/04/2018   Bilateral primary osteoarthritis of knee    DDD (degenerative disc disease), cervical 01/10/2018   Rupture of medial head of gastrocnemius, left, sequela 01/14/2017   Kidney stones 06/22/2016   Essential hypertension 08/01/2010   DYSTHYMIC DISORDER 07/08/2010   PALPITATIONS 07/08/2010    REFERRING DIAG: Z89.511 (ICD-10-CM) - Hx of below knee amputation, right   ONSET DATE: 06/19/2022 Prosthesis delivery  THERAPY DIAG:  Other abnormalities of gait and mobility  Unsteadiness on feet  Muscle weakness (generalized)  Foot drop, left  Rationale for Evaluation and Treatment Rehabilitation  PERTINENT HISTORY: Rt BKA 03/15/22, Left MTA 03/15/2022, bil. TKA 11/04/18, ESRD / chronic kidney disease st5 dialysis Tues/Thurs/Sat, cirrhosis, cardiac arrest May 23 post intubation, A-Fib  PRECAUTIONS: Fall, BP only in RUE starting 10/6  SUBJECTIVE:  Jeffrey Ayala has been doing some work mainly from home.  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 Jeffrey Ayala called on 2 customers.  One had gravel lot.    Jeffrey Ayala reports taking a bath is difficult.    PAIN:  Are you having pain? Left Distal residual limb  0/10 with standing & gait  Right foot distal limb seated 0-2/10 and standing/walking "does not notice it." Focused on balance.   OBJECTIVE: (objective measures completed at initial evaluation unless otherwise dated)    COGNITION: 06/22/2022  Overall cognitive status: Within functional limits for tasks assessed and pt had flat affect so unable to fully assess cognition.   POSTURE: 06/22/2022  rounded shoulders, forward head, flexed trunk , and weight shift left   LOWER  EXTREMITY ROM:   ROM P:passive  A:active Right Eval 06/22/2022 Left Eval 06/22/2022 Right 11/11/22 Left 11/11/22  Hip flexion        Hip extension Standing A: ~30* limitation Standing A: ~30* limitation Standing A: -15* Standing A: -5*  Hip abduction        Hip adduction        Hip internal rotation        Hip external rotation        Knee flexion        Knee extension Standing A: ~15* limitation Standing A: ~  15* limitation Standing A: -5* Standing A: -2*  Ankle dorsiflexion   Standing A: ~25* limitation  Seated A: -18*  Ankle plantarflexion        Ankle inversion        Ankle eversion         (Blank rows = not tested)   LOWER EXTREMITY MMT:   MMT Right Eval 06/22/2022 Left Eval 06/22/2022 Right 11/11/22 Left  11/11/22  Hip flexion 4/5 4/5    Hip extension 2/5 functional 2/5 functional Standing 4/5 Standing 4/5  Hip abduction 2/5 functional 2/5 functional Standing  4/5 Standing 4/5  Hip adduction        Hip internal rotation        Hip external rotation        Knee flexion        Knee extension 3-/5 functional 3-/5 functional 5/5 5/5  Ankle dorsiflexion  2-/5   3-/5 Limited  by ROM  Ankle plantarflexion        Ankle inversion        Ankle eversion        (Blank rows = not tested)   TRANSFERS: 06/22/2022  Sit to stand: Mod A and from 22" w/c requiring armrests to push & RW to stabilize 06/22/2022  Stand to sit: Min A from RW to 22" w/c unable to release RW to reach for w/c until seated.   GAIT: 12/23/2022: 3 Min Walk Test: 26' with cane no LOB Gait Velocity 2.25 ft/sec  11/11/2022: Resting SpO2 99% HR 86 3 Minute Walk Test 402' with cane with SBA Gait Pattern: RLE abduction, decreased stance duration RLE with decreased step length LLE, step through pattern, flexed trunk, LLE foot drop After gait SpO2 99% HR 50 no symptoms of light-headed or ShOB  Gait Velocity 2.07 ft/sec with cane  Pt neg 12* ramp & 6" curb with cane & TTA prosthesis with  CGA.  06/22/2022 Gait pattern: step to pattern, decreased step length- Left, decreased stance time- Right, decreased stride length, decreased hip/knee flexion- Right, circumduction- Right, Right hip hike, knee flexed in stance- Right, knee flexed in stance- Left, antalgic, trunk flexed, abducted- Right, and poor foot clearance- Left excessive weightbearing BUEs on RW Distance walked: 5' Assistive device utilized: Environmental consultant - 2 wheeled and right TTA prosthesis / left plate & shoe filler Level of assistance: Mod A Comments: post gait HR 82, SpO2 99% with dyspnea noted   FUNCTIONAL TESTs:  12/23/2022: TUG with cane 13.48sec;  cog 15.47sec cane;  11/11/2022: Berg Balance 14/56 Timed Up & Go: with cane std 18.50 sec with CGA (time >13.5sec indicates high fall risk)  and  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     CURRENT PROSTHETIC WEAR ASSESSMENT: 11/11/2022: Pt verbalizes proper, independent prosthetic care including donning prosthesis with suction sleeve suspension, skin check, residual limb care, care of non-amputated limb, prosthetic cleaning, ply sock cleaning, correct ply sock adjustment, proper wear schedule/adjustment, and proper weight-bearing schedule/adjustment. Pt reports wear >90% of awake hours with no skin issues or limb pain.    06/22/2022 Patient is dependent with: skin check, residual limb care, care of non-amputated limb, prosthetic cleaning, ply sock cleaning, correct ply sock adjustment, proper wear schedule/adjustment, and proper weight-bearing schedule/adjustment Donning prosthesis: Total A  and max cues required, Doffing prosthesis: Total A and wife able to doffe Prosthetic wear tolerance: 1-2 hours 1-2x/day, for 3 of 3 days since delivery.   Prosthetic weight bearing tolerance: 0 minutes unable to stand outside of PT Edema: pitting with >5sec capillary refill Residual limb condition: 6mm wound with serous drainage & black scab present,  calf measurement is 42.2cm Prosthetic description:  silicon liner with pin suspension, flexible inner socket Total Contact design, dynamic response foot with hydraulic PF/DF K code/activity level with prosthetic use: Level 3     TODAY'S TREATMENT:  01/04/2023: Prosthetic Training with TTA prosthesis: PT instructed patient in using prosthesis and left shoe/insert to enter and exit the bathroom for showering.  PT advised patient and wife to work on independence for bathing to enable him to travel.  Patient verbalized understanding PT reviewed walking program of short distances (room to room) with increasing frequency during the day, long walk which is his max tolerable distance 1 time a day for 5 days/week and medium walks 4+ per day.  Program is designed to build endurance and stamina throughout his awake hours.  Patient and wife verbalized understanding PT demo and verbal cues on floor transfer via half kneeling using chair seat to push on.  Patient able to return demonstration including: Laying in the floor and getting up.  PT recommended performing 2 reps daily for 5 days plus per week.  PT advised that this is to work on strength and function.  Patient and wife verbalized understanding PT demo and verbal cues on getting left shoe partial foot insert in and out of the shoe.  PT also discussed recommendation for shoes to be three-quarter top or higher for suspension due to partial foot amputation.  PT instructed patient in possibility of water leg to aid with showering and pool.  Patient is seeing prosthetist on the 22nd to begin process for a socket revision and new left shoe insert.  PT recommended discussing water like at that that time with the prosthetist.   12/23/2022: Prosthetic Training with  TTA prosthesis: PT recommended setting timer ~30 min when working to remind him to stand.  Jeffrey Ayala should stand with some stretches for LEs & back for 2-5 min to limit stiffness. Pt verbalized understanding. Night pain can be limb volume change especially with kidney issues.  Probability is shrinkage needing 1 to 2-ply adjustment. PT recommended trying to add 1-ply sock for 1-2 nights.  If sock addition makes pain worse, then Jeffrey Ayala is probably swelling with dependent position sitting. So remove 1 to 2-ply fit. Pt & wife verbalized understanding.   See TUG & Gait in objective section. Pt amb with cane naming automotive items A-Z with no LOB but had difficulty with cognitive task that probably would not be difficult if done sitting.  PT recommending as HEP. Pt verbalized understanding.  Pt neg ramp & curb with cane with supervision / verbal cues for technique and without device with min/CGA.  PT instructed in practice inclines with rail on his ramp close and curb with nose of car over edge for contact as needed. Pt & wife verbalized understanding.    12/09/2022: Prosthetic Training with TTA prosthesis: PT educated pt & wife on concerns with nocturnal hypoxia vs problem solving thru CPAP that has been recommended.  PT educated on correlation to function during day. Both verbalized understanding.   Worked on functional balance donning & doffing jacket standing in front of chair & counter/ support in front for safety.  Pt verbalized how to work on this task at home with safe set up.   PT educated verbally on travel recommendations with prosthesis. Fall risk is higher when prosthesis is off.  At home Jeffrey Ayala uses w/c if needs to toilet in middle of night (mainly for urination).  PT recommending taking a urinal to travel so can urinate in timely manner and donne prosthesis to empty urinal.  Pt & wife verbalized understanding.    Pt performed the following activities as addition for HEP. Wife demo safe assistance with 1 &  2. Pt verbalized & return demo understanding of all.   HEP  12/09/2022  1.pick up speed with longer & quicker steps. 2. Walk with eyes closed alternating eyes open 3. Near counter work on standing up & walking without hesitancy 4. Turn around towards counter working on decreasing number of steps. 5. Walk with head turns on X & T  goal is maintain path & pace.    HOME EXERCISE PROGRAM: 1.pick up speed with longer & quicker steps. 2. Walk with eyes closed alternating eyes open 3. Near counter work on standing up & walking without hesitancy 4. Turn around towards counter working on decreasing number of steps. 5. Walk with head turns on X & T  goal is maintain path & pace.  Access Code: [email or web address] URL: [email or web address] Date: 12/02/2022 Prepared by: Vladimir Faster  Exercises - Standing Gastroc Stretch at Counter  - 1-3 x daily - 7 x weekly - 1 sets - 2-3 reps - 20-30 seconds hold - Standing Ankle Dorsiflexion Stretch  - 1-3 x daily - 7 x weekly - 1 sets - 2-3 reps - 20-30 seconds hold - Gastroc Stretch on Step  - 1-3 x daily - 7 x weekly - 1 sets - 2-3 reps - 20-30 seconds hold - Standing Soleus Stretch on Step with Counter Support  - 1-3 x daily - 7 x weekly - 1 sets - 2-3 reps - 20-30 seconds hold - Seated Calf Stretch with Strap  - 1-3 x daily - 7 x weekly - 1 sets - 2-3 reps - 20-30 seconds hold - Seated Soleus Stretch with Strap  - 1-3 x daily - 7 x weekly - 1 sets - 2-3 reps - 20-30 seconds hold - wide stance head motions eyes open  - 1 x daily - 3-5 x weekly - 1 sets - 10 reps - 2 seconds hold - Wide Stance with Eyes Closed  - 1 x daily - 3-5 x weekly - 1 sets - 10 reps - 10 seconds hold - Feet Apart with Eyes Closed with Head Motions  - 1 x daily - 3-5 x weekly - 1 sets - 10 reps - 2 seconds hold - Wide stance on Foam Pad head movements  - 1 x daily - 3-5 x weekly - 1 sets - 10 reps - 2 seconds hold - Supine Cervical Retraction with Towel  - 1 x daily - 7 x weekly  - 1 sets - 10 reps - 5 seconds hold - Cervical Retraction with Overpressure  - 1 x daily - 7 x weekly - 1 sets - 10 reps - 5 seconds hold - Supine Scapular Retraction  - 1 x daily - 7 x weekly - 1 sets - 10 reps - 5 seconds hold - Seated Scapular Retraction  - 1 x daily - 3-5 x weekly - 1 sets - 10 reps - 5 seconds hold - Alternating Hip Flexion Forward Kicks  - 1  x daily - 3-5 x weekly - 1 sets - 10 reps - Standing hip abduction alternating legs  - 1 x daily - 3-5 x weekly - 1 sets - 10 reps - Standing hip extension alternating legs  - 1 x daily - 3-5 x weekly - 1 sets - 10 reps   AT [facility] FIND YOUR MIDLINE POSITION AND PLACE FEET EQUAL DISTANCE FROM THE MIDLINE.  Try to find this position when standing still for activities.   USE TAPE ON FLOOR TO MARK THE MIDLINE POSITION which is even with middle of sink.  You also should try to feel with your limb pressure in socket.  You are trying to feel with limb what you used to feel with the bottom of your foot.  Side to Side Shift: Moving your hips only (not shoulders): move weight onto your left leg, HOLD/FEEL pressure in socket.  Move back to equal weight on each leg, HOLD/FEEL pressure in socket. Move weight onto your right leg, HOLD/FEEL pressure in socket. Move back to equal weight on each leg, HOLD/FEEL pressure in socket. Repeat.  Start with both hands on sink, progress to hand on prosthetic side only, then no hands.  Front to Back Shift: Moving your hips only (not shoulders): move your weight forward onto your toes, HOLD/FEEL pressure in socket. Move your weight back to equal Flat Foot on both legs, HOLD/FEEL  pressure in socket. Move your weight back onto your heels, HOLD/FEEL  pressure in socket. Move your weight back to equal on both legs, HOLD/FEEL  pressure in socket. Repeat.  Start with both hands on sink, progress to hand on prosthetic side only, then no hands.  Moving Cones / Cups: With equal weight on each leg: Hold on with one hand the  first time, then progress to no hand supports. Move cups from one side of sink to the other. Place cups ~2" out of your reach, progress to 10" beyond reach.  Place one hand in middle of sink and reach with other hand. Do both arms.  Then hover one hand and move cups with other hand.  Overhead/Upward Reaching: alternated reaching up to top cabinets or ceiling if no cabinets present. Keep equal weight on each leg. Start with one hand support on counter while other hand reaches and progress to no hand support with reaching.  ace one hand in middle of sink and reach with other hand. Do both arms.  Then hover one hand and move cups with other hand.  5.   Looking Over Shoulders: With equal weight on each leg: alternate turning to look over your shoulders with one hand support on counter as needed.  Start with head motions only to look in front of shoulder, then even with shoulder and progress to looking behind you. To look to side, move head /eyes, then shoulder on side looking pulls back, shift more weight to side looking and pull hip back. Place one hand in middle of sink and let go with other hand so your shoulder can pull back. Switch hands to look other way.   Then hover one hand and look over shoulder. If looking right, use left hand at sink. If looking left, use right hand at sink. 6.  Stepping with leg that is not amputated:  Move items under cabinet out of your way. Shift your hips/pelvis so weight on prosthesis. Tighten muscles in hip on prosthetic side.  SLOWLY step other leg so front of foot is in cabinet. Then step  back to floor.   Pt also recommending sitting at edge of w/c with feet supported on floor but no back support each hour in w/c until back fatigues. PT also demo & instructed in sitting on 24" stool. Pt & wife verbalized understanding.     ASSESSMENT: CLINICAL IMPRESSION: Today's session focused on instructing patient in independence with showering, walking program to build endurance,  floor transfers and possibility of water prosthesis.  Pt continues to benefit from PT to progress function & safety.    OBJECTIVE IMPAIRMENTS Abnormal gait, cardiopulmonary status limiting activity, decreased activity tolerance, decreased balance, decreased endurance, decreased knowledge of condition, decreased knowledge of use of DME, decreased mobility, difficulty walking, decreased ROM, decreased strength, increased edema, impaired flexibility, postural dysfunction, and prosthetic dependency .    ACTIVITY LIMITATIONS carrying, lifting, bending, sitting, standing, squatting, stairs, transfers, locomotion level, and prosthesis use   PARTICIPATION LIMITATIONS: driving, community activity, occupation, yard work, and recreational of camping   PERSONAL FACTORS Fitness, Past/current experiences, Time since onset of injury/illness/exacerbation, and 3+ comorbidities: see PMH  are also affecting patient's functional outcome.    REHAB POTENTIAL: Good   CLINICAL DECISION MAKING: Evolving/moderate complexity   EVALUATION COMPLEXITY: Moderate     GOALS: Goals reviewed with patient? Yes   SHORT TERM GOALS: Target date: 4 visits after re-eval (visit number 24)   Timed Up & Go with cane <15 sec.  Goal status: MET 12/23/2022 2.  Patient demo & verbalizes understanding of updated HEP.  Goal status: MET 12/23/2022   3.  pt amb >500' with cane & prosthesis in 3 minutes with no LOB. Goal status: MET 12/23/2022   UPDATED LONG TERM GOALS: Target date: 04/22/2023   Patient demonstrates & verbalized understanding of ongoing HEP / fitness plan. Goal status: set 11/11/2022  2.   Patient Timed Up & Go <13.5 sec standard & <15sec cognitive Goal status: set 11/11/2022   3.   Berg Balance >/= 36/56 to indicate lower fall risk Goal status: Ongoing 11/11/2022   4.   Patient ambulates >500' with prosthesis & cane or less independently Goal status:Ongoing 08/24/2022   5.   Patient negotiates ramps, curbs &  stairs with single rail with prosthesis & cane or less independently. Goal status: Ongoing 08/24/2022     PLAN: PT FREQUENCY: 1x every 1-2 weeks for 15 visits    PT DURATION: 15 visits over 5 months   PLANNED INTERVENTIONS: Therapeutic exercises, Therapeutic activity, Neuromuscular re-education, Balance training, Gait training, Patient/Family education, Self Care, Stair training, Vestibular training, Canalith repositioning, Prosthetic training, DME instructions, Vasopneumatic device, and physical performance testing   PLAN FOR NEXT SESSION:  set updated STGs, continue to progress balance & mobility safety.    Vladimir Faster, PT, DPT 01/04/2023, 4:21 PM

## 2023-01-05 ENCOUNTER — Encounter: Payer: BC Managed Care – PPO | Admitting: Physical Therapy

## 2023-01-06 ENCOUNTER — Encounter: Payer: BC Managed Care – PPO | Admitting: Physical Therapy

## 2023-01-06 DIAGNOSIS — N184 Chronic kidney disease, stage 4 (severe): Secondary | ICD-10-CM | POA: Diagnosis not present

## 2023-01-11 ENCOUNTER — Encounter: Payer: BC Managed Care – PPO | Admitting: Physical Therapy

## 2023-01-12 ENCOUNTER — Ambulatory Visit (HOSPITAL_COMMUNITY)
Admission: RE | Admit: 2023-01-12 | Discharge: 2023-01-12 | Disposition: A | Discharge status: Home / Self Care | Payer: BC Managed Care – PPO | Source: Ambulatory Visit | Attending: Nephrology | Admitting: Nephrology

## 2023-01-12 VITALS — BP 130/67 | HR 77 | Temp 97.2°F | Resp 18

## 2023-01-12 DIAGNOSIS — N185 Chronic kidney disease, stage 5: Secondary | ICD-10-CM | POA: Diagnosis not present

## 2023-01-12 DIAGNOSIS — D631 Anemia in chronic kidney disease: Secondary | ICD-10-CM | POA: Insufficient documentation

## 2023-01-12 DIAGNOSIS — D509 Iron deficiency anemia, unspecified: Secondary | ICD-10-CM

## 2023-01-12 DIAGNOSIS — N186 End stage renal disease: Secondary | ICD-10-CM

## 2023-01-12 LAB — RENAL FUNCTION PANEL
Albumin: 3.9 g/dL (ref 3.5–5.0)
Anion gap: 9 (ref 5–15)
BUN: 30 mg/dL — ABNORMAL HIGH (ref 8–23)
CO2: 23 mmol/L (ref 22–32)
Calcium: 9.1 mg/dL (ref 8.9–10.3)
Chloride: 104 mmol/L (ref 98–111)
Creatinine, Ser: 2.82 mg/dL — ABNORMAL HIGH (ref 0.61–1.24)
GFR, Estimated: 25 mL/min — ABNORMAL LOW (ref >60)
Glucose, Bld: 179 mg/dL — ABNORMAL HIGH (ref 70–99)
Phosphorus: 3.8 mg/dL (ref 2.5–4.6)
Potassium: 3.4 mmol/L — ABNORMAL LOW (ref 3.5–5.1)
Sodium: 136 mmol/L (ref 135–145)

## 2023-01-12 LAB — CBC WITH DIFFERENTIAL/PLATELET
Abs Immature Granulocytes: 0.06 10*3/uL (ref 0.00–0.07)
Basophils Absolute: 0 10*3/uL (ref 0.0–0.1)
Basophils Relative: 0 %
Eosinophils Absolute: 0 10*3/uL (ref 0.0–0.5)
Eosinophils Relative: 0 %
HCT: 34 % — ABNORMAL LOW (ref 39.0–52.0)
Hemoglobin: 11.1 g/dL — ABNORMAL LOW (ref 13.0–17.0)
Immature Granulocytes: 1 %
Lymphocytes Relative: 16 %
Lymphs Abs: 1.1 10*3/uL (ref 0.7–4.0)
MCH: 32.4 pg (ref 26.0–34.0)
MCHC: 32.6 g/dL (ref 30.0–36.0)
MCV: 99.1 fL (ref 80.0–100.0)
Monocytes Absolute: 0.4 10*3/uL (ref 0.1–1.0)
Monocytes Relative: 6 %
Neutro Abs: 5.2 10*3/uL (ref 1.7–7.7)
Neutrophils Relative %: 77 %
Platelets: 199 10*3/uL (ref 150–400)
RBC: 3.43 MIL/uL — ABNORMAL LOW (ref 4.22–5.81)
RDW: 15.6 % — ABNORMAL HIGH (ref 11.5–15.5)
WBC: 6.8 10*3/uL (ref 4.0–10.5)
nRBC: 0 % (ref 0.0–0.2)

## 2023-01-12 LAB — POCT HEMOGLOBIN-HEMACUE: Hemoglobin: 10.7 g/dL — ABNORMAL LOW (ref 13.0–17.0)

## 2023-01-12 MED ORDER — EPOETIN ALFA-EPBX 10000 UNIT/ML IJ SOLN
20000.0000 [IU] | INTRAMUSCULAR | Status: DC
  Administered 2023-01-12: 20000 [IU] via SUBCUTANEOUS

## 2023-01-12 MED ORDER — EPOETIN ALFA-EPBX 10000 UNIT/ML IJ SOLN
INTRAMUSCULAR | Status: AC
  Filled 2023-01-12: qty 2

## 2023-01-13 ENCOUNTER — Encounter: Payer: BC Managed Care – PPO | Admitting: Physical Therapy

## 2023-01-18 ENCOUNTER — Encounter: Payer: BC Managed Care – PPO | Admitting: Physical Therapy

## 2023-01-18 ENCOUNTER — Other Ambulatory Visit: Payer: Self-pay | Admitting: Physical Medicine & Rehabilitation

## 2023-01-20 ENCOUNTER — Encounter: Payer: BC Managed Care – PPO | Admitting: Physical Therapy

## 2023-01-21 ENCOUNTER — Other Ambulatory Visit (INDEPENDENT_AMBULATORY_CARE_PROVIDER_SITE_OTHER): Payer: BC Managed Care – PPO

## 2023-01-21 ENCOUNTER — Ambulatory Visit: Payer: BC Managed Care – PPO | Admitting: Gastroenterology

## 2023-01-21 ENCOUNTER — Encounter: Payer: Self-pay | Admitting: Gastroenterology

## 2023-01-21 VITALS — BP 112/64 | HR 90 | Ht 70.0 in | Wt 234.0 lb

## 2023-01-21 DIAGNOSIS — R7989 Other specified abnormal findings of blood chemistry: Secondary | ICD-10-CM

## 2023-01-21 DIAGNOSIS — K7460 Unspecified cirrhosis of liver: Secondary | ICD-10-CM

## 2023-01-21 LAB — HEPATIC FUNCTION PANEL
ALT: 16 U/L (ref 0–53)
AST: 17 U/L (ref 0–37)
Albumin: 4.3 g/dL (ref 3.5–5.2)
Alkaline Phosphatase: 100 U/L (ref 39–117)
Bilirubin, Direct: 0 mg/dL (ref 0.0–0.3)
Total Bilirubin: 0.7 mg/dL (ref 0.2–1.2)
Total Protein: 7.3 g/dL (ref 6.0–8.3)

## 2023-01-21 LAB — FERRITIN: Ferritin: 934.5 ng/mL — ABNORMAL HIGH (ref 22.0–322.0)

## 2023-01-21 NOTE — Progress Notes (Signed)
HPI :  62 year old male here for follow-up visit for cirrhosis.  Recall  he had a prolonged hospital stay in May and June of this year, 52 days.  He was septic due to a scrotal infection, developed new onset A-fib leading to anticoagulation use, developed acute kidney injury which progressed to now end-stage renal disease on HD for a period of time.  He also went right BKA and left transmetatarsal amputation. During that time he had imaging which showed cirrhosis of the liver which was a new diagnosis for him.  His prior workup serologically was remarkable for a strongly positive ANA, with a very mildly positive smooth muscle antibody and a normal IgG.  He also had an elevated ferritin. After our last visit we repeated some blood work.  His ferritin has remained elevated, now over thousand but his hemochromatosis genetic testing is negative.  Repeat IgG level is normal.  He is immune to hepatitis B but not hepatitis A, got first hep A vaccine dose.   He had an EGD at the hospital in December which showed no esophageal varices.  Also had interval healing of previous gastric ulcers.  He had a colonoscopy showing a few polyps, nothing high risk. He has remained on Protonix 40 mg daily.  Fortunately since have seen him while he has CKD that is advanced he is off dialysis. He has not had any complications from cirrhosis, has remained compensated.  No varices, no ascites, no jaundice, no hepatic encephalopathy.  He does not drink any alcohol.  He reports his A-fib has been stable.  We previously had discussed the role of potential liver biopsy, we discussed that again today.  He has had hepatic steatosis noted on prior imaging studies.  He had another ultrasound for [facility] screening in November which was negative, showed stable changes of cirrhosis.   Prior workup: EGD 01/02/20 -  - Mucosal changes including mild ringed esophagus and longitudinal furrows were found in the middle third of the esophagus and  in the lower third of the esophagus. Biopsies were obtained from the proximal and distal esophagus with cold forceps for histology for possible underlying eosinophilic esophagitis. - The exam of the esophagus was otherwise normal. No obvious stenosis / stricture noted. - One cratered gastric ulcer was found at the pylorus. The lesion was 3 mm in largest dimension, associated with significant edema / inflammatory changes. Biopsies were taken with a cold forceps for histology from the periphery of it. - Patchy moderate inflammation characterized by erythema, friability and granularity was found in the gastric antrum. - The exam of the stomach was otherwise normal. - Biopsies were taken with a cold forceps in the gastric body, at the incisura and in the gastric antrum for Helicobacter pylori testing. - Diffuse moderate inflammation characterized by erythema was found in the distal duodenal bulb, with edema traversing this area into the sweep. Biopsies were taken with a cold forceps for histology. - The exam of the duodenum was otherwise normal.   1. Surgical [P], duodenal - DUODENAL MUCOSA WITH CHANGES CONSISTENT WITH PEPTIC INJURY. - NO FEATURES OF SPRUE OR GRANULOMAS. 2. Surgical [P], gastric antrum and gastric body - ANTRAL AND OXYNTIC MUCOSA WITH MILDLY ACTIVE CHRONIC INFLAMMATION AND REACTIVE CHANGES. - WARTHIN-STARRY NEGATIVE FOR HELICOBACTER PYLORI. - NO INTESTINAL METAPLASIA, DYSPLASIA OR CARCINOMA. 3. Surgical [P], esophagus - SQUAMOUS MUCOSA WITH REFLUX CHANGES AND INCREASED EOSINOPHILS. - FOCALLY GREATER THAN 20 PER HIGH POWER FIELD. - NO DYSPLASIA OR CARCINOMA. 4. Surgical [P],  gastric pylorus, peripheral ulcer - PYLORIC MUCOSA WITH MILDLY ACTIVE INFLAMMATION, HYPEREMIA AND REACTIVE CHANGES. - NO INTESTINAL METAPLASIA, DYSPLASIA OR CARCINOMA.     CT renal stone 03/31/22: IMPRESSION: 1. No acute intra-abdominal or pelvic pathology. 2. Multiple nonobstructing bilateral renal  calculi. No hydronephrosis. 3. Colonic diverticulosis. No bowel obstruction. Normal appendix. 4. Cirrhosis with splenomegaly and small perihepatic ascites. 5. Aortic Atherosclerosis (ICD10-I70.0).     CT 02/28/22: IMPRESSION: 1. Severe scrotal edema and bilateral hydroceles, increased compared to prior examination. 2. Ascites and anasarca. 3. Multiple small bilateral nonobstructive renal calculi. No hydronephrosis. 4. Splenomegaly. 5. Cardiomegaly and coronary artery disease.     RUQ [place] 02/24/22: IMPRESSION: 1. Two focal hypoechoic geographic areas seen at the gallbladder fossa, likely due to focal fatty sparing, although liver mass cannot be excluded. Recommend further evaluation with contrast-enhanced liver MRI which can be performed non emergently. 2. Hepatic steatosis. 3. Mildly nodular liver contour, suggestive of cirrhosis.  Hemochromatosis genetic testing negative  RUQ [place] 09/26/22: IMPRESSION: Mild cirrhosis.  No suspicious focal hepatic lesion.    EGD 10/26/22: Esophagogastric landmarks were identified: the Z-line was found at 44 cm, the gastroesophageal junction was found at 44 cm and the upper extent of the gastric folds was found at 44 cm from the incisors. Findings: The exam of the esophagus was otherwise normal. No varices or active inflammation noted. Multiple small sessile polyps were found in the gastric antrum and in the prepyloric region of the stomach. Biopsies were taken with a cold forceps for histology. The exam of the stomach was otherwise normal. No varices. Interval healing of gastric ulcer. The examined duodenum was normal.   Colonoscopy 10/26/22: - One 3 to 4 mm polyp in the cecum, removed with a cold snare. Resected and retrieved. - Diverticulosis in the left colon and in the right colon. - One 3 mm polyp in the transverse colon, removed with a cold snare. Resected and retrieved. - Two 3 mm polyps in the sigmoid colon, removed with a cold snare. Resected  and retrieved. - Internal hemorrhoids. - The examination was otherwise normal  FINAL MICROSCOPIC DIAGNOSIS:   A. STOMACH, POLYPECTOMY:  - Gastric antral mucosa with nonspecific reactive gastropathy  - Helicobacter pylori-like organisms are not identified on routine HE  stain   B. COLON, CECUM, TRANSVERSE, POLYPECTOMY:  - Tubular adenoma(s)  - Negative for high-grade dysplasia or malignancy   C. COLON, SIGMOID, POLYPECTOMY:  - Tubular adenoma  - Negative for high-grade dysplasia or malignancy   recall for EGD in 3 years, colonoscopy in 5 years.    Past Medical History:  Diagnosis Date   A-fib [facility])    Acute nonintractable headache 03/25/2022   AKI (acute kidney injury) ([place])    Anemia    associated with chronic renal disease   Arthritis    Atrial fibrillation, chronic ([name])    not on [facility]   Cardiac arrest ([place]) 02/2022   in the setting of septic shock   Cirrhosis of liver ([name])    Concussion    Dysrhythmia    afib   Eosinophilic esophagitis    EGD/bx by Dr. 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  Type 2 diabetes mellitus with stage 4 chronic kidney disease [facility])      Past Surgical History:  Procedure Laterality Date   AMPUTATION Right 03/15/2022   Procedure: RIGHT BELOW KNEE AMPUTATION;  Surgeon: [name], MD;  Location: [place];  Service: Orthopedics;  Laterality: Right;   AV FISTULA PLACEMENT Left 08/28/2022   Procedure: LEFT ARM ARTERIOVENOUS (AV) FISTULA CREATION;  Surgeon: Waynetta Sandy, MD;  Location: [place];  Service: Vascular;  Laterality: Left;   BIOPSY  10/26/2022   Procedure: BIOPSY;  Surgeon: Yetta Flock, MD;  [facility];  Service: Gastroenterology;;    CARDIOVERSION N/A 11/20/2022   Procedure: CARDIOVERSION;  Surgeon: Freada Bergeron, MD;  Location: [facility];  Service: Cardiovascular;  Laterality: N/A;   COLONOSCOPY WITH PROPOFOL N/A 10/26/2022   Procedure: COLONOSCOPY WITH PROPOFOL;  Surgeon: Yetta Flock, MD;  [facility];  Service: Gastroenterology;  Laterality: N/A;   CYSTOSCOPY     CYSTOSCOPY WITH STENT PLACEMENT Bilateral 08/15/2022   Procedure: CYSTOSCOPY WITH STENT PLACEMENT;  Surgeon: Remi Haggard, MD;  Location: [place];  Service: Urology;  Laterality: Bilateral;   CYSTOSCOPY/URETEROSCOPY/HOLMIUM LASER/STENT PLACEMENT Bilateral 09/14/2022   Procedure: CYSTOSCOPY/URETEROSCOPY/HOLMIUM LASER/STENT PLACEMENT;  Surgeon: Raynelle Bring, MD;  [facility];  Service: Urology;  Laterality: Bilateral;   DENTAL SURGERY     ESOPHAGOGASTRODUODENOSCOPY (EGD) WITH PROPOFOL N/A 10/26/2022   Procedure: ESOPHAGOGASTRODUODENOSCOPY (EGD) WITH PROPOFOL;  Surgeon: Yetta Flock, MD;  [facility];  Service: Gastroenterology;  Laterality: N/A;   FINGER SURGERY     HERNIA REPAIR     at age 56   IR FLUORO GUIDE CV LINE RIGHT  03/09/2022   IR PATIENT EVAL TECH 0-60 MINS  03/09/2022   IR US GUIDE VASC ACCESS RIGHT  03/09/2022   KNEE ARTHROSCOPY Left    LITHOTRIPSY     NEPHROLITHOTOMY     POLYPECTOMY  10/26/2022   Procedure: POLYPECTOMY;  Surgeon: Yetta Flock, MD;  Location[facility];  Service: Gastroenterology;;   Carolynn Serve EXPLORATION N/A 03/16/2022   Procedure: IRRIGATION AND DEBRIDEMENT SCROTUM;  Surgeon: Raynelle Bring, MD;  Location: [place];  Service: Urology;  Laterality: N/A;   SCROTAL EXPLORATION N/A 03/28/2022   Procedure: IRRIGATION AND DEBRIDEMENT SCROTUM;  Surgeon: Ardis Hughs, MD;  Location: [place];  Service: Urology;  Laterality: N/A;   TONSILLECTOMY     TOTAL KNEE ARTHROPLASTY Bilateral 11/04/2018   Procedure: BILATERAL TOTAL KNEE ARTHROPLASTY;  Surgeon: [name], MD;   Location: [place];  Service: Orthopedics;  Laterality: Bilateral;  spinal/epidural per anesthesiologist   TRANSMETATARSAL AMPUTATION Left 03/15/2022   Procedure: LEFT TRANSMETATARSAL AMPUTATION;  Surgeon: [name], MD;  Location: [place];  Service: Orthopedics;  Laterality: Left;   Family History  Problem Relation Age of Onset   Arrhythmia Mother    COPD Mother    Diabetes Mother    Kidney disease Mother    Gout Father    Kidney disease Father    Sarcoidosis Brother    Cancer Brother    Stomach cancer Neg Hx    Pancreatic cancer Neg Hx    Esophageal cancer Neg Hx    Colon cancer Neg Hx    Rectal cancer Neg Hx    Social History   Tobacco Use   Smoking status: Never   Smokeless tobacco: Never  Vaping Use   Vaping Use: Never used  Substance Use Topics   Alcohol use: Not Currently   Drug use: No  Current Outpatient Medications  Medication Sig Dispense Refill   acetaminophen (TYLENOL) 500 MG tablet Take 1,000 mg by mouth every 6 (six) hours as needed for moderate pain or headache. Rapid Release     allopurinol (ZYLOPRIM) 100 MG tablet TAKE 1 TABLET BY MOUTH EVERY DAY 90 tablet 1   apixaban (ELIQUIS) 5 MG TABS tablet Take 1 tablet (5 mg total) by mouth 2 (two) times daily. 180 tablet 3   diphenhydramine-acetaminophen (TYLENOL PM) 25-500 MG TABS tablet Take 2 tablets by mouth at bedtime as needed (sleep).     furosemide (LASIX) 40 MG tablet Take 40 mg by mouth daily.     gabapentin (NEURONTIN) 100 MG capsule Take 1 capsule (100 mg total) by mouth 3 (three) times daily. 90 capsule 3   metoprolol tartrate (LOPRESSOR) 25 MG tablet Take 12.5 mg by mouth 2 (two) times daily.     pantoprazole (PROTONIX) 40 MG tablet Take 40 mg by mouth daily.     Potassium Chloride ER 20 MEQ TBCR Take 1 tablet by mouth daily.     traZODone (DESYREL) 50 MG tablet TAKE 1/2 OR 1 TABLET BY MOUTH AT BEDTIME AS NEEDED FOR SLEEP 90 tablet 1   No current facility-administered medications for this visit.    Allergies  Allergen Reactions   Codeine Hives    Insomnia, jittery   Flomax [Tamsulosin Hcl] Nausea Only    Nausea and fatigue     Review of Systems: All systems reviewed and negative except where noted in HPI.   Labs reviewed in Epic  Physical Exam: BP 112/64   Pulse 90   Ht '5\' 10"'$  (1.778 m)   Wt 234 lb (106.1 kg)   SpO2 99%   BMI 33.58 kg/m  Constitutional: Pleasant,well-developed, male in no acute distress. Neurological: Alert and oriented to person place and time. Psychiatric: Normal mood and affect. Behavior is normal.   ASSESSMENT: 62 y.o. male here for assessment of the following  1. Cirrhosis of liver without ascites, unspecified hepatic cirrhosis type ([name])   2. Elevated ferritin    As above, compensated cirrhosis noted incidentally during imaging done at the time for prolonged hospital stay and other issues going on.  Liver enzymes have historically been normal.  Hepatic steatosis noted on prior imaging.  He had a strongly positive ANA, weakly positive SMA, normal IgG.  Also with fairly elevated ferritin's but negative hemochromatosis genetic testing.  It seems less likely that he has autoimmune hepatitis given the normal IgG and that the SMA is only weakly positive.  Iron overload remains possible although less likely with his genetic testing.  However his ferritin has risen above the thousand.  I will check that again today to see where this is trending.  It would seem that fatty liver would be the more likely cause of his cirrhosis, however with the ferritin elevation, we could consider liver biopsy to ensure no iron overload and ensure no autoimmune hepatitis.  We discussed risk benefits of liver biopsy.  He has seen hematology in the past, I may reach out to them to discuss his elevated ferritin's based on his repeat value.  They want to think about liver biopsy for now, question is if it would change his management or not.   Otherwise we discussed cirrhosis,  risk for decompensation moving forward.  He is avoiding alcohol.  Will do [place] screening again in May with an ultrasound, due for AFP at this time and repeat LFTs.  He is due for  his final dose of hep A vaccine in April.  Repeat EGD in 3 years for varices screening if he is otherwise compensated.  Due for colonoscopy in another 5 years for history of polyps.  He agree with the plan, continue to follow-up every 6 months   PLAN: - lab today - ferritin, LFTs, AFP - repeat RUQ [place] in May for [facility] screening - hep A vaccine in April - consideration for liver biopsy given ferritin and autoimmune markers but I think maybe less likely to change management. Will see what repeat ferritin shows, may discuss with hematology - repeat EGD in 3 years and colonoscopy 5 years from last exams - avoid EtOH and NSAIDs - f/u 6 months  Jolly Mango, MD [facility]

## 2023-01-21 NOTE — Patient Instructions (Signed)
We have scheduled you a follow up appointment on 06/24/2023 at 9:20am   Your second Hep A shot is on 03/24/2023 at 9:20am   Your provider has requested that you go to the basement level for lab work before leaving today. Press "B" on the elevator. The lab is located at the first door on the left as you exit the elevator.  You will be contacted by [place] in the next 2 days to arrange a liver ultrasound in May.  The number on your caller ID will be [phone], please answer when they call.  If you have not heard from them in 2 days please call [phone] to schedule.   You will be due for a recall colonoscopy in 10/24/2027/. Recall EGD in 10/23/2025. We will send you a reminder in the mail when it gets closer to that time.   _____________________________________________________  If your blood pressure at your visit was 140/90 or greater, please contact your primary care physician to follow up on this.  _______________________________________________________  If you are age 73 or older, your body mass index should be between 23-30. Your Body mass index is 33.58 kg/m. If this is out of the aforementioned range listed, please consider follow up with your Primary Care Provider.  If you are age 26 or younger, your body mass index should be between 19-25. Your Body mass index is 33.58 kg/m. If this is out of the aformentioned range listed, please consider follow up with your Primary Care Provider.   ________________________________________________________  The [facility] providers would like to encourage you to use [facility] to communicate with providers for non-urgent requests or questions.  Due to long hold times on the telephone, sending your provider a message by [facility] may be a faster and more efficient way to get a response.  Please allow 48 business hours for a response.  Please remember that this is for non-urgent requests.   _______________________________________________________ It was a pleasure to see you today!  Thank you for trusting me with your gastrointestinal care!

## 2023-01-23 ENCOUNTER — Other Ambulatory Visit: Payer: Self-pay | Admitting: Physical Medicine & Rehabilitation

## 2023-01-25 LAB — AFP TUMOR MARKER: AFP-Tumor Marker: 2.7 ng/mL (ref <6.1)

## 2023-01-26 DIAGNOSIS — I700 Atherosclerosis of aorta: Secondary | ICD-10-CM | POA: Diagnosis not present

## 2023-01-26 DIAGNOSIS — N200 Calculus of kidney: Secondary | ICD-10-CM | POA: Diagnosis not present

## 2023-01-26 DIAGNOSIS — R3129 Other microscopic hematuria: Secondary | ICD-10-CM | POA: Diagnosis not present

## 2023-01-26 DIAGNOSIS — R8271 Bacteriuria: Secondary | ICD-10-CM | POA: Diagnosis not present

## 2023-02-01 ENCOUNTER — Telehealth: Payer: Self-pay | Admitting: Orthopedic Surgery

## 2023-02-01 ENCOUNTER — Ambulatory Visit: Payer: BC Managed Care – PPO | Admitting: Orthopedic Surgery

## 2023-02-01 NOTE — Telephone Encounter (Signed)
Called patient left message to return call per mychart message need to reschedule appointment

## 2023-02-03 ENCOUNTER — Encounter: Payer: BC Managed Care – PPO | Admitting: Physical Therapy

## 2023-02-09 ENCOUNTER — Encounter (HOSPITAL_COMMUNITY): Payer: BC Managed Care – PPO

## 2023-02-11 ENCOUNTER — Other Ambulatory Visit: Payer: Self-pay

## 2023-02-11 ENCOUNTER — Telehealth: Payer: Self-pay

## 2023-02-11 DIAGNOSIS — K7460 Unspecified cirrhosis of liver: Secondary | ICD-10-CM

## 2023-02-11 DIAGNOSIS — R7989 Other specified abnormal findings of blood chemistry: Secondary | ICD-10-CM

## 2023-02-11 NOTE — Telephone Encounter (Signed)
[facility] Pre-operative Risk Assessment     Request for surgical clearance:     Endoscopy Procedure  What type of surgery is being performed?     Liver biopsy  When is this surgery scheduled?     TBD  What type of clearance is required ?   Pharmacy  Are there any medications that need to be held prior to surgery and how long? Eliquis 2 days  Practice name and name of physician performing surgery?      [facility]  What is your office phone and fax number?      Phone- [phone]  [phone]  Anesthesia type (None, local, MAC, general) ?       MAC

## 2023-02-12 ENCOUNTER — Ambulatory Visit (HOSPITAL_COMMUNITY)
Admission: RE | Admit: 2023-02-12 | Discharge: 2023-02-12 | Disposition: A | Discharge status: Home / Self Care | Payer: BC Managed Care – PPO | Source: Ambulatory Visit | Attending: Nephrology | Admitting: Nephrology

## 2023-02-12 ENCOUNTER — Telehealth: Payer: Self-pay | Admitting: *Deleted

## 2023-02-12 VITALS — BP 141/68 | HR 94 | Temp 97.5°F | Resp 18

## 2023-02-12 DIAGNOSIS — D509 Iron deficiency anemia, unspecified: Secondary | ICD-10-CM | POA: Insufficient documentation

## 2023-02-12 DIAGNOSIS — N185 Chronic kidney disease, stage 5: Secondary | ICD-10-CM | POA: Insufficient documentation

## 2023-02-12 DIAGNOSIS — N186 End stage renal disease: Secondary | ICD-10-CM | POA: Diagnosis present

## 2023-02-12 DIAGNOSIS — D631 Anemia in chronic kidney disease: Secondary | ICD-10-CM | POA: Diagnosis not present

## 2023-02-12 DIAGNOSIS — N183 Chronic kidney disease, stage 3 unspecified: Secondary | ICD-10-CM | POA: Diagnosis not present

## 2023-02-12 LAB — CBC WITH DIFFERENTIAL/PLATELET
Abs Immature Granulocytes: 0.09 10*3/uL — ABNORMAL HIGH (ref 0.00–0.07)
Basophils Absolute: 0 10*3/uL (ref 0.0–0.1)
Basophils Relative: 0 %
Eosinophils Absolute: 0 10*3/uL (ref 0.0–0.5)
Eosinophils Relative: 0 %
HCT: 32.1 % — ABNORMAL LOW (ref 39.0–52.0)
Hemoglobin: 11 g/dL — ABNORMAL LOW (ref 13.0–17.0)
Immature Granulocytes: 1 %
Lymphocytes Relative: 18 %
Lymphs Abs: 1.2 10*3/uL (ref 0.7–4.0)
MCH: 32.6 pg (ref 26.0–34.0)
MCHC: 34.3 g/dL (ref 30.0–36.0)
MCV: 95.3 fL (ref 80.0–100.0)
Monocytes Absolute: 0.4 10*3/uL (ref 0.1–1.0)
Monocytes Relative: 6 %
Neutro Abs: 4.8 10*3/uL (ref 1.7–7.7)
Neutrophils Relative %: 75 %
Platelets: 195 10*3/uL (ref 150–400)
RBC: 3.37 MIL/uL — ABNORMAL LOW (ref 4.22–5.81)
RDW: 15.9 % — ABNORMAL HIGH (ref 11.5–15.5)
WBC: 6.4 10*3/uL (ref 4.0–10.5)
nRBC: 0 % (ref 0.0–0.2)

## 2023-02-12 LAB — RENAL FUNCTION PANEL
Albumin: 3.9 g/dL (ref 3.5–5.0)
Anion gap: 9 (ref 5–15)
BUN: 23 mg/dL (ref 8–23)
CO2: 22 mmol/L (ref 22–32)
Calcium: 8.9 mg/dL (ref 8.9–10.3)
Chloride: 106 mmol/L (ref 98–111)
Creatinine, Ser: 2.7 mg/dL — ABNORMAL HIGH (ref 0.61–1.24)
GFR, Estimated: 26 mL/min — ABNORMAL LOW (ref >60)
Glucose, Bld: 139 mg/dL — ABNORMAL HIGH (ref 70–99)
Phosphorus: 3.2 mg/dL (ref 2.5–4.6)
Potassium: 3.7 mmol/L (ref 3.5–5.1)
Sodium: 137 mmol/L (ref 135–145)

## 2023-02-12 LAB — IRON AND TIBC
Iron: 73 ug/dL (ref 45–182)
Saturation Ratios: 32 % (ref 17.9–39.5)
TIBC: 227 ug/dL — ABNORMAL LOW (ref 250–450)
UIBC: 154 ug/dL

## 2023-02-12 LAB — FERRITIN: Ferritin: 1095 ng/mL — ABNORMAL HIGH (ref 24–336)

## 2023-02-12 LAB — POCT HEMOGLOBIN-HEMACUE: Hemoglobin: 11 g/dL — ABNORMAL LOW (ref 13.0–17.0)

## 2023-02-12 MED ORDER — EPOETIN ALFA-EPBX 10000 UNIT/ML IJ SOLN: 20000.0000 [IU] | INTRAMUSCULAR | Status: DC

## 2023-02-12 MED ORDER — EPOETIN ALFA-EPBX 10000 UNIT/ML IJ SOLN
INTRAMUSCULAR | Status: AC
  Administered 2023-02-12: 20000 [IU] via SUBCUTANEOUS
  Filled 2023-02-12: qty 2

## 2023-02-12 NOTE — Telephone Encounter (Signed)
Patient with diagnosis of afib on Eliquis for anticoagulation.    Procedure: liver biopsy Date of procedure: TBD  CHA2DS2-VASc Score = 2  This indicates a 2.2% annual risk of stroke. The patient's score is based upon: CHF History: 0 HTN History: 1 Diabetes History: 0 Stroke History: 0 Vascular Disease History: 1 Age Score: 0 Gender Score: 0   DCCV 11/20/22  CrCl - 80mL/min using adj body weight Platelet count 195K  Per office protocol, patient can hold Eliquis for 2 days prior to procedure as requested.    **This guidance is not considered finalized until pre-operative APP has relayed final recommendations.**

## 2023-02-12 NOTE — Telephone Encounter (Signed)
Duplicate clearance request, addressed in 02/11/23 clearance note. They only requested a 2 day hold then, so not sure if they resent because they now want a longer hold? Eliquis does not require a 5 day hold for any procedure. 2 days should be sufficient, max hold would be 3 days which is ok if they have a preference for that instead.

## 2023-02-12 NOTE — Telephone Encounter (Signed)
   Patient Name: Jeffrey Ayala  DOB: Apr 07, 1961 MRN: [id number]  Primary Cardiologist: Evalina Field, MD  Chart reviewed as part of pre-operative protocol coverage. Pre-op clearance already addressed by colleagues in earlier phone notes. To summarize recommendations:  -Per office protocol, patient can hold Eliquis for 2 days prior to procedure as requested please resume when medically safe to do so.  No medical clearance requested.  Will route this bundled recommendation to requesting provider via Epic fax function and remove from pre-op pool. Please call with questions.  Elgie Collard, PA-C 02/12/2023, 12:12 PM

## 2023-02-12 NOTE — Telephone Encounter (Signed)
   Pre-operative Risk Assessment    Patient Name: Jeffrey Ayala  DOB: 12/02/1960 MRN: [id number]      Request for Surgical Clearance    Procedure:   LIVER BIOPSY  Date of Surgery:  Clearance 02/26/23                                 Surgeon:   Surgeon's Group or Practice Name:  [facility] Phone number:   Fax number:  [phone]    Type of Clearance Requested:   - Pharmacy:  Hold Apixaban (Eliquis) X'S 5 DAYS PRIOIR   Type of Anesthesia:  Not Indicated   Additional requests/questions:    Astrid Divine   02/12/2023, 3:10 PM

## 2023-02-15 NOTE — Telephone Encounter (Signed)
Per pre op APP I called the requesting office, ph# on original request 02/11/23. Left verbal message that we are needing to confirm is this request for something different, though still states liver Bx. The new request that came over on 02/12/23 request holding blood thinner now x 5 days, where the original request asked for a 2 day hold. Need to clarify information.

## 2023-02-16 ENCOUNTER — Telehealth: Payer: Self-pay | Admitting: Gastroenterology

## 2023-02-16 ENCOUNTER — Other Ambulatory Visit: Payer: Self-pay | Admitting: Physical Medicine & Rehabilitation

## 2023-02-16 NOTE — Telephone Encounter (Signed)
I tried again to reach the requesting office so that we may clarify how many days are being requested to hold Eliquis. See previous notes. Surgery scheduler is out until Thursday this week. I left a vebal message to have the surgery scheduler call back to d/w further for clarification and review notes from pharm-d.

## 2023-02-16 NOTE — Telephone Encounter (Signed)
Attempted to reach requesting office via phone and office has not returned call. Fax sent over for Eliquis clarification

## 2023-02-16 NOTE — Telephone Encounter (Signed)
[facility] office called and we talked about the 2 clearance request for the same procedure. I stated that we are asking for clarification, as the procedure is the same, however other information is different, including office requesting and fax#.   After speaking with [facility] it it felt that the 2nd fax most likely came from Interventional Radiology Dept as they will be doing the liver Bx on the pt. I then called IR to confirm if the 2nd fax is indeed from them and confirm are they requesting to hold Eliquis x 5 as stated.   Please see notes from pharm-d.  Duplicate clearance request, addressed in 02/11/23 clearance note. They only requested a 2 day hold then, so not sure if they resent because they now want a longer hold? Eliquis does not require a 5 day hold for any procedure. 2 days should be sufficient, max hold would be 3 days which is ok if they have a preference for that instead.       I am going to fax # [phone]

## 2023-02-16 NOTE — Telephone Encounter (Signed)
Called Jeffrey Ayala and let her know we sent the request to hold Eliquis for 2 days. She thinks IR may have sent request to hold for 5 days. She will call IR to speak to them.

## 2023-02-16 NOTE — Telephone Encounter (Signed)
Jeffrey Ayala from [facility] called to get clarification on the hold time on the blood thinner for the colonoscopy scheduled. Requesting call back [phone]-Pre op nurse

## 2023-02-17 ENCOUNTER — Encounter: Payer: BC Managed Care – PPO | Admitting: Physical Therapy

## 2023-02-17 DIAGNOSIS — Z89511 Acquired absence of right leg below knee: Secondary | ICD-10-CM | POA: Diagnosis not present

## 2023-02-17 NOTE — Telephone Encounter (Signed)
   Primary Cardiologist: Evalina Field, MD  Chart reviewed as part of pre-operative protocol coverage. Given past medical history and time since last visit, based on ACC/AHA guidelines, ULAS METZKER would be at acceptable risk for the planned procedure without further cardiovascular testing.   Patient was advised that if he develops new symptoms prior to surgery to contact our office to arrange a follow-up appointment. He verbalized understanding.  A 5 day hold for Eliquis is not indicated for any procedure. Eliquis should not be held for more than 3 days.   I will route this recommendation to the requesting party via Epic fax function and remove from pre-op pool.  Please call with questions.  Emmaline Life, NP-C  02/17/2023, 11:06 AM [address], [address] Office [phone] Fax [phone]

## 2023-02-25 DIAGNOSIS — N184 Chronic kidney disease, stage 4 (severe): Secondary | ICD-10-CM | POA: Diagnosis not present

## 2023-02-26 ENCOUNTER — Ambulatory Visit: Payer: BC Managed Care – PPO | Admitting: Family Medicine

## 2023-02-26 ENCOUNTER — Ambulatory Visit (HOSPITAL_COMMUNITY): Payer: BC Managed Care – PPO

## 2023-03-02 DIAGNOSIS — R3121 Asymptomatic microscopic hematuria: Secondary | ICD-10-CM | POA: Diagnosis not present

## 2023-03-02 DIAGNOSIS — N130 Hydronephrosis with ureteropelvic junction obstruction: Secondary | ICD-10-CM | POA: Diagnosis not present

## 2023-03-03 ENCOUNTER — Encounter: Payer: BC Managed Care – PPO | Admitting: Physical Therapy

## 2023-03-04 ENCOUNTER — Other Ambulatory Visit: Payer: Self-pay | Admitting: Radiology

## 2023-03-04 DIAGNOSIS — K7460 Unspecified cirrhosis of liver: Secondary | ICD-10-CM

## 2023-03-05 ENCOUNTER — Ambulatory Visit (HOSPITAL_COMMUNITY)
Admission: RE | Admit: 2023-03-05 | Discharge: 2023-03-05 | Disposition: A | Discharge status: Home / Self Care | Payer: BC Managed Care – PPO | Source: Ambulatory Visit | Attending: Gastroenterology | Admitting: Gastroenterology

## 2023-03-05 ENCOUNTER — Encounter: Payer: Self-pay | Admitting: Family Medicine

## 2023-03-05 ENCOUNTER — Ambulatory Visit (INDEPENDENT_AMBULATORY_CARE_PROVIDER_SITE_OTHER): Payer: BC Managed Care – PPO

## 2023-03-05 ENCOUNTER — Ambulatory Visit (INDEPENDENT_AMBULATORY_CARE_PROVIDER_SITE_OTHER): Payer: BC Managed Care – PPO | Admitting: Family Medicine

## 2023-03-05 ENCOUNTER — Other Ambulatory Visit: Payer: Self-pay

## 2023-03-05 ENCOUNTER — Encounter (HOSPITAL_COMMUNITY): Payer: Self-pay

## 2023-03-05 VITALS — BP 134/70 | HR 80 | Temp 97.8°F | Ht 70.0 in | Wt 236.0 lb

## 2023-03-05 DIAGNOSIS — Z89432 Acquired absence of left foot: Secondary | ICD-10-CM

## 2023-03-05 DIAGNOSIS — M109 Gout, unspecified: Secondary | ICD-10-CM | POA: Diagnosis not present

## 2023-03-05 DIAGNOSIS — Z89422 Acquired absence of other left toe(s): Secondary | ICD-10-CM

## 2023-03-05 DIAGNOSIS — E039 Hypothyroidism, unspecified: Secondary | ICD-10-CM | POA: Diagnosis not present

## 2023-03-05 DIAGNOSIS — K7460 Unspecified cirrhosis of liver: Secondary | ICD-10-CM | POA: Insufficient documentation

## 2023-03-05 DIAGNOSIS — L282 Other prurigo: Secondary | ICD-10-CM

## 2023-03-05 DIAGNOSIS — M79672 Pain in left foot: Secondary | ICD-10-CM

## 2023-03-05 DIAGNOSIS — F329 Major depressive disorder, single episode, unspecified: Secondary | ICD-10-CM

## 2023-03-05 DIAGNOSIS — K760 Fatty (change of) liver, not elsewhere classified: Secondary | ICD-10-CM | POA: Diagnosis not present

## 2023-03-05 DIAGNOSIS — K739 Chronic hepatitis, unspecified: Secondary | ICD-10-CM | POA: Diagnosis not present

## 2023-03-05 DIAGNOSIS — I10 Essential (primary) hypertension: Secondary | ICD-10-CM | POA: Diagnosis not present

## 2023-03-05 DIAGNOSIS — R7989 Other specified abnormal findings of blood chemistry: Secondary | ICD-10-CM

## 2023-03-05 DIAGNOSIS — R600 Localized edema: Secondary | ICD-10-CM | POA: Diagnosis not present

## 2023-03-05 LAB — CBC WITH DIFFERENTIAL/PLATELET
Abs Immature Granulocytes: 0.04 10*3/uL (ref 0.00–0.07)
Basophils Absolute: 0 10*3/uL (ref 0.0–0.1)
Basophils Absolute: 0 10*3/uL (ref 0.0–0.1)
Basophils Relative: 0.4 % (ref 0.0–3.0)
Basophils Relative: 0 %
Eosinophils Absolute: 0.1 10*3/uL (ref 0.0–0.7)
Eosinophils Absolute: 0 10*3/uL (ref 0.0–0.5)
Eosinophils Relative: 1.1 % (ref 0.0–5.0)
Eosinophils Relative: 0 %
HCT: 33.3 % — ABNORMAL LOW (ref 39.0–52.0)
HCT: 33.4 % — ABNORMAL LOW (ref 39.0–52.0)
Hemoglobin: 11.5 g/dL — ABNORMAL LOW (ref 13.0–17.0)
Hemoglobin: 10.8 g/dL — ABNORMAL LOW (ref 13.0–17.0)
Immature Granulocytes: 1 %
Lymphocytes Relative: 14.4 % (ref 12.0–46.0)
Lymphocytes Relative: 14 %
Lymphs Abs: 1.2 10*3/uL (ref 0.7–4.0)
Lymphs Abs: 1.1 10*3/uL (ref 0.7–4.0)
MCH: 31.1 pg (ref 26.0–34.0)
MCHC: 34.5 g/dL (ref 30.0–36.0)
MCHC: 32.3 g/dL (ref 30.0–36.0)
MCV: 94 fl (ref 78.0–100.0)
MCV: 96.3 fL (ref 80.0–100.0)
Monocytes Absolute: 0.5 10*3/uL (ref 0.1–1.0)
Monocytes Absolute: 0.5 10*3/uL (ref 0.1–1.0)
Monocytes Relative: 6.1 % (ref 3.0–12.0)
Monocytes Relative: 7 %
Neutro Abs: 6.4 10*3/uL (ref 1.4–7.7)
Neutro Abs: 6.3 10*3/uL (ref 1.7–7.7)
Neutrophils Relative %: 78 % — ABNORMAL HIGH (ref 43.0–77.0)
Neutrophils Relative %: 78 %
Platelets: 238 10*3/uL (ref 150.0–400.0)
Platelets: 201 10*3/uL (ref 150–400)
RBC: 3.54 Mil/uL — ABNORMAL LOW (ref 4.22–5.81)
RBC: 3.47 MIL/uL — ABNORMAL LOW (ref 4.22–5.81)
RDW: 16.1 % — ABNORMAL HIGH (ref 11.5–15.5)
RDW: 15.4 % (ref 11.5–15.5)
WBC: 8.3 10*3/uL (ref 4.0–10.5)
WBC: 8 10*3/uL (ref 4.0–10.5)
nRBC: 0 % (ref 0.0–0.2)

## 2023-03-05 LAB — PROTIME-INR
INR: 1 (ref 0.8–1.2)
Prothrombin Time: 13.4 seconds (ref 11.4–15.2)

## 2023-03-05 LAB — COMPREHENSIVE METABOLIC PANEL
ALT: 14 U/L (ref 0–44)
AST: 17 U/L (ref 15–41)
Albumin: 4.1 g/dL (ref 3.5–5.0)
Alkaline Phosphatase: 84 U/L (ref 38–126)
Anion gap: 9 (ref 5–15)
BUN: 28 mg/dL — ABNORMAL HIGH (ref 8–23)
CO2: 25 mmol/L (ref 22–32)
Calcium: 8.9 mg/dL (ref 8.9–10.3)
Chloride: 105 mmol/L (ref 98–111)
Creatinine, Ser: 2.6 mg/dL — ABNORMAL HIGH (ref 0.61–1.24)
GFR, Estimated: 27 mL/min — ABNORMAL LOW (ref >60)
Glucose, Bld: 116 mg/dL — ABNORMAL HIGH (ref 70–99)
Potassium: 3.8 mmol/L (ref 3.5–5.1)
Sodium: 139 mmol/L (ref 135–145)
Total Bilirubin: 0.6 mg/dL (ref 0.3–1.2)
Total Protein: 7.5 g/dL (ref 6.5–8.1)

## 2023-03-05 LAB — TSH: TSH: 1.49 u[IU]/mL (ref 0.35–5.50)

## 2023-03-05 LAB — URIC ACID: Uric Acid, Serum: 9 mg/dL — ABNORMAL HIGH (ref 4.0–7.8)

## 2023-03-05 MED ORDER — FENTANYL CITRATE (PF) 100 MCG/2ML IJ SOLN
INTRAMUSCULAR | Status: AC | PRN
  Administered 2023-03-05: 50 ug via INTRAVENOUS

## 2023-03-05 MED ORDER — MIDAZOLAM HCL 2 MG/2ML IJ SOLN
INTRAMUSCULAR | Status: AC | PRN
  Administered 2023-03-05: 1 mg via INTRAVENOUS

## 2023-03-05 MED ORDER — SODIUM CHLORIDE 0.9 % IV SOLN: INTRAVENOUS | Status: DC

## 2023-03-05 MED ORDER — TRIAMCINOLONE ACETONIDE 0.025 % EX OINT
1.0000 | TOPICAL_OINTMENT | Freq: Two times a day (BID) | CUTANEOUS | 0 refills | Status: DC
Start: 2023-03-05 — End: 2023-05-19

## 2023-03-05 MED ORDER — MIDAZOLAM HCL 2 MG/2ML IJ SOLN
INTRAMUSCULAR | Status: AC
  Filled 2023-03-05: qty 2

## 2023-03-05 MED ORDER — MUPIROCIN 2 % EX OINT
1.0000 | TOPICAL_OINTMENT | Freq: Two times a day (BID) | CUTANEOUS | 0 refills | Status: DC
Start: 2023-03-05 — End: 2023-05-19

## 2023-03-05 MED ORDER — FENTANYL CITRATE (PF) 100 MCG/2ML IJ SOLN
INTRAMUSCULAR | Status: AC
  Filled 2023-03-05: qty 2

## 2023-03-05 MED ORDER — GELATIN ABSORBABLE 12-7 MM EX MISC
CUTANEOUS | Status: AC
  Filled 2023-03-05: qty 1

## 2023-03-05 MED ORDER — ALLOPURINOL 100 MG PO TABS
200.0000 mg | ORAL_TABLET | Freq: Every day | ORAL | 0 refills | Status: DC
Start: 2023-03-05 — End: 2023-05-19

## 2023-03-05 MED ORDER — LIDOCAINE HCL 1 % IJ SOLN
INTRAMUSCULAR | Status: AC
  Filled 2023-03-05: qty 20

## 2023-03-05 NOTE — Discharge Instructions (Signed)
Discharge Instructions:   Please call Interventional Radiology clinic [phone] with any questions or concerns.  You may remove your dressing and shower tomorrow.  Moderate Conscious Sedation, Adult, Care After This sheet gives you information about how to care for yourself after your procedure. Your health care provider may also give you more specific instructions. If you have problems or questions, contact your health care provider. What can I expect after the procedure? After the procedure, it is common to have: Sleepiness for several hours. Impaired judgment for several hours. Difficulty with balance. Vomiting if you eat too soon. Follow these instructions at home: For the time period you were told by your health care provider: Rest. Do not participate in activities where you could fall or become injured. Do not drive or use machinery. Do not drink alcohol. Do not take sleeping pills or medicines that cause drowsiness. Do not make important decisions or sign legal documents. Do not take care of children on your own. Eating and drinking  Follow the diet recommended by your health care provider. Drink enough fluid to keep your urine pale yellow. If you vomit: Drink water, juice, or soup when you can drink without vomiting. Make sure you have little or no nausea before eating solid foods. General instructions Take over-the-counter and prescription medicines only as told by your health care provider. Have a responsible adult stay with you for the time you are told. It is important to have someone help care for you until you are awake and alert. Do not smoke. Keep all follow-up visits as told by your health care provider. This is important. Contact a health care provider if: You are still sleepy or having trouble with balance after 24 hours. You feel light-headed. You keep feeling nauseous or you keep vomiting. You develop a rash. You have a fever. You have redness or  swelling around the IV site. Get help right away if: You have trouble breathing. You have new-onset confusion at home. Summary After the procedure, it is common to feel sleepy, have impaired judgment, or feel nauseous if you eat too soon. Rest after you get home. Know the things you should not do after the procedure. Follow the diet recommended by your health care provider and drink enough fluid to keep your urine pale yellow. Get help right away if you have trouble breathing or new-onset confusion at home. This information is not intended to replace advice given to you by your health care provider. Make sure you discuss any questions you have with your health care provider. Document Revised: 03/08/2020 Document Reviewed: 10/05/2019 Elsevier Patient Education  2023 Elsevier Inc.   Liver Biopsy, Care After The following information offers guidance on how to care for yourself after your procedure. Your health care provider may also give you more specific instructions. If you have problems or questions, contact your health care provider. What can I expect after the procedure? After your procedure, it is common to: Have pain and soreness in the area where the biopsy was done. Have bruising around the area where the biopsy was done. Feel tired for 1-2 days. Follow these instructions at home: Medicines Take over-the-counter and prescription medicines only as told by your health care provider. If you were prescribed an antibiotic medicine, take it as told by your health care provider. Do not stop taking the antibiotic, even if you start to feel better. Do not take medicines, such as aspirin and ibuprofen, unless your doctor tells you to take them. Ask your   health care provider if the medicine prescribed to you: Requires you to avoid driving or using machinery. Can cause constipation. You may need to take these actions to prevent or treat constipation: Drink enough fluid to keep your urine pale  yellow. Take over-the-counter or prescription medicines. Eat foods that are high in fiber, such as beans, whole grains, and fresh fruits and vegetables. Limit foods that are high in fat and processed sugars, such as fried or sweet foods. Incision care Follow instructions from your health care provider about how to take care of your incisions. Make sure you: Wash your hands with soap and water for at least 20 seconds before and after you change your bandage (dressing). If soap and water are not available, use hand sanitizer. Change your dressing as told by your health care provider. Leave stitches (sutures), skin glue, or adhesive strips in place. These skin closures may need to stay in place for 2 weeks or longer. If adhesive strip edges start to loosen and curl up, you may trim the loose edges. Do not remove adhesive strips completely unless your health care provider tells you to do that. Check your incision areas every day for signs of infection. Check for: Redness, swelling, or more pain. Fluid or blood. Warmth. Pus or a bad smell. Do not take baths, swim, or use a hot tub until your health care provider says it is okay to do so. Activity Rest at home for 1-2 days, or as directed by your health care provider. Avoid sitting for a long time without moving. Get up to take short walks every 1-2 hours. This is important to improve blood flow and breathing. Ask for help if you feel weak or unsteady. Do not lift anything that is heavier than 10 lb (4.5 kg), or the limit that your health care provider tells you, until he or she says that it is safe. Do not play contact sports for 2 weeks after the procedure. Return to your normal activities as told by your health care provider. Ask your health care provider what activities are safe for you. General instructions  Do not drink alcohol in the first week after the procedure. Plan to have a responsible adult care for you for the time you are told after  you leave the hospital or clinic. This is important. It is up to you to get the results of your procedure. Ask your health care provider, or the department that is doing the procedure, when your results will be ready. Keep all follow-up visits. This is important. Contact a health care provider if: You have increased bleeding from an incision. You have redness, swelling, or increasing pain in any incisions. You notice a discharge or a bad smell coming from any of your incisions. You develop a rash. You have a fever or chills. Get help right away if: You develop swelling, bloating, or pain in your abdomen. You become dizzy or faint. You have nausea or vomiting. You have shortness of breath or difficulty breathing. You develop chest pain. You have problems with your speech or vision. You have trouble with your balance or moving your arms or legs. These symptoms may represent a serious problem that is an emergency. Do not wait to see if the symptoms will go away. Get medical help right away. Call your local emergency services (911 in the [place] Do not drive yourself to the hospital. Summary After the liver biopsy, it is common to have pain, soreness, and bruising in the area,   as well as tiredness (fatigue). Take over-the-counter and prescription medicines only as told by your health care provider. Follow instructions from your health care provider about how to care for your incisions. Check your incision areas daily for signs of infection. This information is not intended to replace advice given to you by your health care provider. Make sure you discuss any questions you have with your health care provider. Document Revised: 09/23/2020 Document Reviewed: 09/23/2020 Elsevier Patient Education  2023 Elsevier Inc.  

## 2023-03-05 NOTE — Progress Notes (Signed)
His anemia has improved. Normal thyroid function. His uric acid level is high so I recommend increasing the allopurinol to 200 mg daily. We will send in a new prescription to reflect this change. Unlikely his foot pain is from gout but let's continue to monitor it closely.

## 2023-03-05 NOTE — Sedation Documentation (Signed)
Sample #2 obtained 

## 2023-03-05 NOTE — Assessment & Plan Note (Signed)
Check TSH. He is not on medication

## 2023-03-05 NOTE — Assessment & Plan Note (Signed)
Incision well healed. No sign of infection.

## 2023-03-05 NOTE — Patient Instructions (Signed)
Please go downstairs for labs and X ray

## 2023-03-05 NOTE — Assessment & Plan Note (Signed)
No injury. Partial amputation. No sign of infection. X ray ordered to rule out osteomyelitis. Follow up pending result. Follow up with [name] if not improving.

## 2023-03-05 NOTE — Assessment & Plan Note (Signed)
Unclear etiology. Triamcinolone prescribed. Bactroban also prescribed to use for possible bacteria. Follow up if worsening or not improving.

## 2023-03-05 NOTE — Assessment & Plan Note (Signed)
Depression improving

## 2023-03-05 NOTE — H&P (Signed)
Referring Physician(s): Armbruster,Steven P  Supervising Physician: Mir, Mauri Reading  Patient Status:  [facility] OP  Chief Complaint: "I'm here for a liver biopsy"   Subjective: Pt known to IR team from HD cath placement in 2023. He is a 62 yo male with PMH afib, anemia, arthritis, GERD, gastric ulcer, renal stones, DM, chronic kidney disease along with compensated cirrhosis,  fatty liver noted on prior imaging. He had a strongly positive ANA, weakly positive SMA, normal IgG. Also with fairly elevated ferritin but negative hemochromatosis genetic testing. He presents today for image guided liver biopsy to ensure no iron overload and ensure no autoimmune hepatitis. He denies fever, HA,CP,dyspnea, cough, abd pain, back pain,N/V or bleeding.    Past Medical History:  Diagnosis Date   A-fib    Acute nonintractable headache 03/25/2022   AKI (acute kidney injury)    Anemia    associated with chronic renal disease   Arthritis    Atrial fibrillation, chronic    not on [facility]   Cardiac arrest 02/2022   in the setting of septic shock   Cirrhosis of liver    Concussion    Dysrhythmia    afib   Eosinophilic esophagitis    EGD/bx by Dr. 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   Type 2 diabetes mellitus with stage 4 chronic kidney disease    Past Surgical History:  Procedure Laterality Date   AMPUTATION Right 03/15/2022   Procedure: RIGHT BELOW KNEE AMPUTATION;  Surgeon: Nadara Mustard, MD;  Location: [facility] OR;  Service: Orthopedics;  Laterality: Right;   AV FISTULA PLACEMENT Left 08/28/2022   Procedure: LEFT ARM ARTERIOVENOUS (AV) FISTULA CREATION;  Surgeon: Maeola Harman, MD;  Location[facility] OR;  Service: Vascular;  Laterality: Left;   BIOPSY   10/26/2022   Procedure: BIOPSY;  Surgeon: Benancio Deeds, MD;  Location: [facility] ENDOSCOPY;  Service: Gastroenterology;;   CARDIOVERSION N/A 11/20/2022   Procedure: CARDIOVERSION;  Surgeon: Meriam Sprague, MD;  Location: [facility] ENDOSCOPY;  Service: Cardiovascular;  Laterality: N/A;   COLONOSCOPY WITH PROPOFOL N/A 10/26/2022   Procedure: COLONOSCOPY WITH PROPOFOL;  Surgeon: Benancio Deeds, MD;  Location: [facility] ENDOSCOPY;  Service: Gastroenterology;  Laterality: N/A;   CYSTOSCOPY     CYSTOSCOPY WITH STENT PLACEMENT Bilateral 08/15/2022   Procedure: CYSTOSCOPY WITH STENT PLACEMENT;  Surgeon: Belva Agee, MD;  Location: [facility] OR;  Service: Urology;  Laterality: Bilateral;   CYSTOSCOPY/URETEROSCOPY/HOLMIUM LASER/STENT PLACEMENT Bilateral 09/14/2022   Procedure: CYSTOSCOPY/URETEROSCOPY/HOLMIUM LASER/STENT PLACEMENT;  Surgeon: Heloise Purpura, MD;  Location: [facility] ORS;  Service: Urology;  Laterality: Bilateral;   DENTAL SURGERY     ESOPHAGOGASTRODUODENOSCOPY (EGD) WITH PROPOFOL N/A 10/26/2022   Procedure: ESOPHAGOGASTRODUODENOSCOPY (EGD) WITH PROPOFOL;  Surgeon: Benancio Deeds, MD;  Location: [facility] ENDOSCOPY;  Service: Gastroenterology;  Laterality: N/A;   FINGER SURGERY     HERNIA REPAIR     at age 22   IR FLUORO GUIDE CV LINE RIGHT  03/09/2022   IR PATIENT EVAL TECH 0-60 MINS  03/09/2022   IR US GUIDE VASC ACCESS RIGHT  03/09/2022   KNEE ARTHROSCOPY Left    LITHOTRIPSY     NEPHROLITHOTOMY     POLYPECTOMY  10/26/2022  Procedure: POLYPECTOMY;  Surgeon: Benancio Deeds, MD;  Location: [facility] ENDOSCOPY;  Service: Gastroenterology;;   Karen Kays EXPLORATION N/A 03/16/2022   Procedure: IRRIGATION AND DEBRIDEMENT SCROTUM;  Surgeon: Heloise Purpura, MD;  Location: [facility] OR;  Service: Urology;  Laterality: N/A;   SCROTAL EXPLORATION N/A 03/28/2022   Procedure: IRRIGATION AND DEBRIDEMENT SCROTUM;  Surgeon: Crist Fat, MD;  Location: [facility] OR;  Service: Urology;  Laterality: N/A;   TONSILLECTOMY     TOTAL  KNEE ARTHROPLASTY Bilateral 11/04/2018   Procedure: BILATERAL TOTAL KNEE ARTHROPLASTY;  Surgeon: Nadara Mustard, MD;  Location: [facility] OR;  Service: Orthopedics;  Laterality: Bilateral;  spinal/epidural per anesthesiologist   TRANSMETATARSAL AMPUTATION Left 03/15/2022   Procedure: LEFT TRANSMETATARSAL AMPUTATION;  Surgeon: Nadara Mustard, MD;  Location: [facility] OR;  Service: Orthopedics;  Laterality: Left;      Allergies: Codeine and Flomax [tamsulosin hcl]  Medications: Prior to Admission medications   Medication Sig Start Date End Date Taking? Authorizing Provider  acetaminophen (TYLENOL) 500 MG tablet Take 1,000 mg by mouth every 6 (six) hours as needed for moderate pain or headache. Rapid Release   Yes [provider]  allopurinol (ZYLOPRIM) 100 MG tablet TAKE 1 TABLET BY MOUTH EVERY DAY 09/21/22  Yes Henson, Vickie L, NP-C  diphenhydramine-acetaminophen (TYLENOL PM) 25-500 MG TABS tablet Take 2 tablets by mouth at bedtime as needed (sleep).   Yes [provider]  furosemide (LASIX) 40 MG tablet Take 40 mg by mouth daily.   Yes [provider]  gabapentin (NEURONTIN) 100 MG capsule Take 1 capsule (100 mg total) by mouth 3 (three) times daily. 11/04/22  Yes Barnie Del R, NP  metoprolol tartrate (LOPRESSOR) 25 MG tablet Take 12.5 mg by mouth 2 (two) times daily.   Yes [provider]  pantoprazole (PROTONIX) 40 MG tablet Take 40 mg by mouth daily.   Yes [provider]  Potassium Chloride ER 20 MEQ TBCR Take 1 tablet by mouth daily. 01/11/23  Yes [provider]  traZODone (DESYREL) 50 MG tablet TAKE 1/2 OR 1 TABLET BY MOUTH AT BEDTIME AS NEEDED FOR SLEEP 02/17/23  Yes Ranelle Oyster, MD  apixaban (ELIQUIS) 5 MG TABS tablet Take 1 tablet (5 mg total) by mouth 2 (two) times daily. 12/04/22   O'Neal, Ronnald Ramp, MD  triamcinolone (KENALOG) 0.025 % ointment Apply 1 Application topically 2 (two) times daily. 03/05/23   Henson, Zorita Pang, NP-C      Vital Signs: BP 129/79   Pulse 84   Temp 98.9 F (37.2 C) (Oral)   Resp 18   Ht  (1.778 m)   Wt 235 lb 14.3 oz (107 kg)   SpO2 100%   BMI 33.85 kg/m    Code Status: FULL CODE   Physical Exam: awake/alert; chest- CTA bilat; heart- RRR; abd- soft,+BS,NT; right BKA, left transmetatarsal amputation  Imaging: DG Foot Complete Left  Result Date: 03/05/2023 CLINICAL DATA:  Left foot pain, history of prior amputation. EXAM: LEFT FOOT - COMPLETE 3+ VIEW COMPARISON:  None Available. FINDINGS: Postsurgical changes for prior transmetatarsal amputation. Heterogeneous bone marrow density suggesting generalized osteopenia. No cortical erosion or periosteal reaction. Prominent vascular calcifications. Soft tissue edema without evidence of fluid collection or abscess. IMPRESSION: 1. Postsurgical changes for prior transmetatarsal amputation. No radiographic evidence of osteomyelitis. 2. Soft tissue edema without evidence of fluid collection or abscess. If there is skin wound or clinical concern for infectious process including osteomyelitis, further evaluation with MR examination is suggested. Electronically  Signed   By: Larose Hires D.O.   On: 03/05/2023 10:15    Labs:  CBC: Recent Labs    11/17/22 0921 11/17/22 0929 11/20/22 0907 12/15/22 0840 12/15/22 0848 01/12/23 0827 01/12/23 0830 02/12/23 0901 02/12/23 0902  WBC 7.6  --   --  8.4  --  6.8  --  6.4  --   HGB 11.7*   < > 11.2* 11.3*   < > 11.1* 10.7* 11.0* 11.0*  HCT 34.8*  --  33.0* 33.1*  --  34.0*  --  32.1*  --   PLT 178  --   --  213  --  199  --  195  --    < > = values in this interval not displayed.    COAGS: Recent Labs    03/29/22 2059 04/01/22 0300 04/18/22 1446 05/20/22 1101 05/29/22 1307 06/05/22 1441 06/15/22 1530  INR 1.2   < >  --  1.7* 1.1* 1.5* 2.7  APTT 37*  --  36  --   --   --   --    < > = values in this interval not displayed.    BMP: Recent Labs    11/17/22 0921 11/20/22 0907  12/15/22 0840 01/12/23 0827 02/12/23 0901  NA 141 142 135 136 137  K 2.9* 2.9* 3.9 3.4* 3.7  CL 102 103 104 104 106  CO2 25  --  22 23 22   GLUCOSE 138* 123* 152* 179* 139*  BUN 26* 24* 32* 30* 23  CALCIUM 9.1  --  9.2 9.1 8.9  CREATININE 2.91* 3.20* 2.97* 2.82* 2.70*  GFRNONAA 24*  --  23* 25* 26*    LIVER FUNCTION TESTS: Recent Labs    05/27/22 1057 06/09/22 1207 08/15/22 0720 11/17/22 0921 12/15/22 0840 01/12/23 0827 01/21/23 0955 02/12/23 0901  BILITOT 0.6 0.6 0.6  --   --   --  0.7  --   AST 11 11 21   --   --   --  17  --   ALT 6 6 16   --   --   --  16  --   ALKPHOS 85 88 73  --   --   --  100  --   PROT 7.4 7.4 6.6  --   --   --  7.3  --   ALBUMIN 4.2 4.0 3.5   < > 4.0 3.9 4.3 3.9   < > = values in this interval not displayed.    Assessment and Plan: 62 yo male with PMH afib, anemia, arthritis, GERD, gastric ulcer, renal stones, DM, chronic kidney disease along with compensated cirrhosis,  fatty liver noted on prior imaging. He had a strongly positive ANA, weakly positive SMA, normal IgG. Also with fairly elevated ferritin but negative hemochromatosis genetic testing. He presents today for image guided liver biopsy to ensure no iron overload and ensure no autoimmune hepatitis.Risks and benefits of procedure was discussed with the patient including, but not limited to bleeding, infection, damage to adjacent structures or low yield requiring additional tests.  All of the questions were answered and there is agreement to proceed.  Consent signed and in chart.  LABS PENDING  Electronically Signed: D. Jeananne Rama, PA-C 03/05/2023, 11:56 AM   I spent a total of 25 minutes at the the patient's bedside AND on the patient's hospital floor or unit, greater than 50% of which was counseling/coordinating care for image guided random liver biopsy

## 2023-03-05 NOTE — Assessment & Plan Note (Signed)
Managed by cardiologist. Close to goal today.

## 2023-03-05 NOTE — Sedation Documentation (Signed)
Sample #3 obtained 

## 2023-03-05 NOTE — Assessment & Plan Note (Signed)
No recent gout flares. Check uric acid and adjust dose as recommended.

## 2023-03-05 NOTE — Sedation Documentation (Signed)
Sample #1 obtained 

## 2023-03-05 NOTE — Progress Notes (Signed)
Subjective:     Patient ID: Jeffrey Ayala, male    DOB: 1961/08/01, 62 y.o.   MRN: [id number]  Chief Complaint  Patient presents with   Foot Pain    Top part of left foot very tender and in pain for the last week. Would like it looked at.    Rash    Rash on shoulder for about a week, was on abx for an abscess and thinks it came up during the time he was on it (amoxicillin)   Medical Management of Chronic Issues    6 month f/u    HPI Patient is in today for follow up on chronic health conditions.   He has several specialists caring for him including GI, orthopedics, nephrology, urology and cardiology   C/o left foot pain pain and tenderness. No injury. He is s/p transmetatarsal amputation of the same foot. No redness or open sores.   Liver biopsy scheduled for later today.   Pruritic rash of upper back x 1 wk. Recently took Amoxicillin prior to rash.   Taking allopurinol 100 mg daily for gout. No recent gout attacks.   Hx of hypothyroidism per EMR. Is not on medication. No recent TSH.     There are no preventive care reminders to display for this patient.  Past Medical History:  Diagnosis Date   A-fib    Acute nonintractable headache 03/25/2022   AKI (acute kidney injury)    Anemia    associated with chronic renal disease   Arthritis    Atrial fibrillation, chronic    not on [facility]   Cardiac arrest 02/2022   in the setting of septic shock   Cirrhosis of liver    Concussion    Dysrhythmia    afib   Eosinophilic esophagitis    EGD/bx by Dr. 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   Type 2 diabetes mellitus with stage 4 chronic kidney disease     Past Surgical History:  Procedure Laterality Date   AMPUTATION Right  03/15/2022   Procedure: RIGHT BELOW KNEE AMPUTATION;  Surgeon: Nadara Mustard, MD;  [facility];  Service: Orthopedics;  Laterality: Right;   AV FISTULA PLACEMENT Left 08/28/2022   Procedure: LEFT ARM ARTERIOVENOUS (AV) FISTULA CREATION;  Surgeon: Maeola Harman, MD;  Location: [facility] OR;  Service: Vascular;  Laterality: Left;   BIOPSY  10/26/2022   Procedure: BIOPSY;  Surgeon: Benancio Deeds, MD;  [facility]: [facility] ENDOSCOPY;  Service: Gastroenterology;;   CARDIOVERSION N/A 11/20/2022   Procedure: CARDIOVERSION;  Surgeon: Meriam Sprague, MD;  Location: [facility] ENDOSCOPY;  Service: Cardiovascular;  Laterality: N/A;   COLONOSCOPY WITH PROPOFOL N/A 10/26/2022   Procedure: COLONOSCOPY WITH PROPOFOL;  Surgeon: Benancio Deeds, MD;  [facility]: [facility]OS[facility];  Service: Gastroenterology;  Laterality: N/A;   CYSTOSCOPY     CYSTOSCOPY WITH STENT PLACEMENT Bilateral 08/15/2022   Procedure: CYSTOSCOPY WITH STENT PLACEMENT;  Surgeon: Belva Agee, MD;  Location: [facility] OR;  Service: Urology;  Laterality: Bilateral;   CYSTOSCOPY/URETEROSCOPY/HOLMIUM LASER/STENT PLACEMENT Bilateral 09/14/2022   Procedure: CYSTOSCOPY/URETEROSCOPY/HOLMIUM LASER/STENT PLACEMENT;  Surgeon: Heloise Purpura, MD;  [facility]: [facility] ORS;  Service: Urology;  Laterality: Bilateral;   DENTAL  SURGERY     ESOPHAGOGASTRODUODENOSCOPY (EGD) WITH PROPOFOL N/A 10/26/2022   Procedure: ESOPHAGOGASTRODUODENOSCOPY (EGD) WITH PROPOFOL;  Surgeon: Benancio Deeds, MD;  [facility]: [facility] ENDOS[facility];  Service: Gastroenterology;  Laterality: N/A;   FINGER SURGERY     HERNIA REPAIR     at age 46   IR FLUORO GUIDE CV LINE RIGHT  03/09/2022   IR PATIENT EVAL TECH 0-60 MINS  03/09/2022   IR US GUIDE VASC ACCESS RIGHT  03/09/2022   KNEE ARTHROSCOPY Left    LITHOTRIPSY     NEPHROLITHOTOMY     POLYPECTOMY  10/26/2022   Procedure: POLYPECTOMY;  Surgeon: Benancio Deeds, MD;  Location: [facility];  Service: Gastroenterology;;   Karen Kays EXPLORATION  N/A 03/16/2022   Procedure: IRRIGATION AND DEBRIDEMENT SCROTUM;  Surgeon: Heloise Purpura, MD;  Location: [facility] OR;  Service: Urology;  Laterality: N/A;   SCROTAL EXPLORATION N/A 03/28/2022   Procedure: IRRIGATION AND DEBRIDEMENT SCROTUM;  Surgeon: Crist Fat, MD;  Location: [facility] OR;  Service: Urology;  Laterality: N/A;   TONSILLECTOMY     TOTAL KNEE ARTHROPLASTY Bilateral 11/04/2018   Procedure: BILATERAL TOTAL KNEE ARTHROPLASTY;  Surgeon: Nadara Mustard, MD;  Location[facility] OR;  Service: Orthopedics;  Laterality: Bilateral;  spinal/epidural per anesthesiologist   TRANSMETATARSAL AMPUTATION Left 03/15/2022   Procedure: LEFT TRANSMETATARSAL AMPUTATION;  Surgeon: Nadara Mustard, MD;  Location: [facility] OR;  Service: Orthopedics;  Laterality: Left;    Family History  Problem Relation Age of Onset   Arrhythmia Mother    COPD Mother    Diabetes Mother    Kidney disease Mother    Gout Father    Kidney disease Father    Sarcoidosis Brother    Cancer Brother    Stomach cancer Neg Hx    Pancreatic cancer Neg Hx    Esophageal cancer Neg Hx    Colon cancer Neg Hx    Rectal cancer Neg Hx     Social History   Socioeconomic History   Marital status: Married    Spouse name: Not on file   Number of children: 2   Years of education: Not on file   Highest education level: Not on file  Occupational History   Occupation: Airline pilot - Chief of Staff  Tobacco Use   Smoking status: Never   Smokeless tobacco: Never  Vaping Use   Vaping Use: Never used  Substance and Sexual Activity   Alcohol use: Not Currently   Drug use: No   Sexual activity: Not Currently  Other Topics Concern   Not on file  Social History Narrative   Not on file   Social Determinants of Health   Financial Resource Strain: Not on file  Food Insecurity: No Food Insecurity (08/19/2022)   Hunger Vital Sign    Worried About Running Out of Food in the Last Year: Never true    Ran Out of Food in the Last Year: Never true   Transportation Needs: No Transportation Needs (08/19/2022)   PRAPARE - Administrator, Civil Service (Medical): No    Lack of Transportation (Non-Medical): No  Physical Activity: Not on file  Stress: Not on file  Social Connections: Not on file  Intimate Partner Violence: Not on file    Outpatient Medications Prior to Visit  Medication Sig Dispense Refill   acetaminophen (TYLENOL) 500 MG tablet Take 1,000 mg by mouth every 6 (six) hours as needed for moderate pain or headache. Rapid Release     apixaban (ELIQUIS) 5  MG TABS tablet Take 1 tablet (5 mg total) by mouth 2 (two) times daily. 180 tablet 3   diphenhydramine-acetaminophen (TYLENOL PM) 25-500 MG TABS tablet Take 2 tablets by mouth at bedtime as needed (sleep).     furosemide (LASIX) 40 MG tablet Take 40 mg by mouth daily.     gabapentin (NEURONTIN) 100 MG capsule Take 1 capsule (100 mg total) by mouth 3 (three) times daily. 90 capsule 3   metoprolol tartrate (LOPRESSOR) 25 MG tablet Take 12.5 mg by mouth 2 (two) times daily.     pantoprazole (PROTONIX) 40 MG tablet Take 40 mg by mouth daily.     Potassium Chloride ER 20 MEQ TBCR Take 1 tablet by mouth daily.     traZODone (DESYREL) 50 MG tablet TAKE 1/2 OR 1 TABLET BY MOUTH AT BEDTIME AS NEEDED FOR SLEEP 90 tablet 2   allopurinol (ZYLOPRIM) 100 MG tablet TAKE 1 TABLET BY MOUTH EVERY DAY 90 tablet 1   No facility-administered medications prior to visit.    Allergies  Allergen Reactions   Codeine Hives    Insomnia, jittery   Flomax [Tamsulosin Hcl] Nausea Only    Nausea and fatigue    Review of Systems  Constitutional:  Negative for chills, fever, malaise/fatigue and weight loss.  Respiratory:  Negative for cough and shortness of breath.   Cardiovascular:  Negative for chest pain, palpitations and leg swelling.  Gastrointestinal:  Negative for abdominal pain, constipation, diarrhea, nausea and vomiting.  Genitourinary:  Negative for dysuria, frequency and  urgency.  Musculoskeletal:        Left foot pain  Skin:  Positive for itching and rash.  Neurological:  Negative for dizziness and sensory change.       Objective:    Physical Exam Constitutional:      General: He is not in acute distress.    Appearance: He is not ill-appearing.  Eyes:     Extraocular Movements: Extraocular movements intact.     Conjunctiva/sclera: Conjunctivae normal.  Cardiovascular:     Rate and Rhythm: Normal rate and regular rhythm.  Pulmonary:     Effort: Pulmonary effort is normal.     Breath sounds: Normal breath sounds.  Musculoskeletal:     Cervical back: Normal range of motion and neck supple.     Comments: Mild edema to dorsum of left foot, TTP, without erythema, drainage or sign of infection.   Skin:    General: Skin is warm and dry.     Capillary Refill: Capillary refill takes less than 2 seconds.     Findings: Rash present.     Comments: Raised red rash of upper back, pruritic  Neurological:     General: No focal deficit present.     Mental Status: He is alert and oriented to person, place, and time.  Psychiatric:        Mood and Affect: Mood normal.        Behavior: Behavior normal.        Thought Content: Thought content normal.     BP 134/70 (BP Location: Left Arm, Patient Position: Sitting, Cuff Size: Large)   Pulse 80   Temp 97.8 F (36.6 C) (Temporal)   Ht 5\' 10"  (1.778 m)   Wt 236 lb (107 kg) Comment: with prostetic  SpO2 98%   BMI 33.86 kg/m  Wt Readings from Last 3 Encounters:  03/05/23 235 lb 14.3 oz (107 kg)  03/05/23 236 lb (107 kg)  01/21/23 234 lb (106.1 kg)  Assessment & Plan:   Problem List Items Addressed This Visit       Cardiovascular and Mediastinum   Essential hypertension    Managed by cardiologist. Close to goal today.       Relevant Orders   CBC with Differential/Platelet (Completed)     Endocrine   Hypothyroidism, unspecified    Check TSH. He is not on medication      Relevant  Orders   TSH (Completed)     Musculoskeletal and Integument   Pruritic rash - Primary    Unclear etiology. Triamcinolone prescribed. Bactroban also prescribed to use for possible bacteria. Follow up if worsening or not improving.       Relevant Medications   triamcinolone (KENALOG) 0.025 % ointment   mupirocin ointment (BACTROBAN) 2 %     Other   Gout    No recent gout flares. Check uric acid and adjust dose as recommended.       Relevant Medications   allopurinol (ZYLOPRIM) 100 MG tablet   Other Relevant Orders   Uric acid (Completed)   Left foot pain    No injury. Partial amputation. No sign of infection. X ray ordered to rule out osteomyelitis. Follow up pending result. Follow up with [name] if not improving.       Relevant Orders   CBC with Differential/Platelet (Completed)   DG Foot Complete Left (Completed)   Reactive depression    Depression improving.       S/P transmetatarsal amputation of foot, left    Incision well healed. No sign of infection.       Relevant Orders   DG Foot Complete Left (Completed)    I have changed Leonette Most K. Wiedel "Chuck"'s allopurinol. I am also having him start on triamcinolone and mupirocin ointment. Additionally, I am having him maintain his diphenhydramine-acetaminophen, furosemide, metoprolol tartrate, acetaminophen, gabapentin, apixaban, pantoprazole, Potassium Chloride ER, and traZODone.  Meds ordered this encounter  Medications   triamcinolone (KENALOG) 0.025 % ointment    Sig: Apply 1 Application topically 2 (two) times daily.    Dispense:  30 g    Refill:  0    Order Specific Question:   Supervising Provider    Answer:   Hillard Danker A [[id number]]   allopurinol (ZYLOPRIM) 100 MG tablet    Sig: Take 2 tablets (200 mg total) by mouth daily.    Dispense:  180 tablet    Refill:  0    Order Specific Question:   Supervising Provider    Answer:   Hillard Danker A [[id number]]   mupirocin ointment (BACTROBAN) 2 %    Sig:  Apply 1 Application topically 2 (two) times daily.    Dispense:  22 g    Refill:  0    Order Specific Question:   Supervising Provider    Answer:   Hillard Danker A [[id number]]

## 2023-03-05 NOTE — Sedation Documentation (Signed)
Needle removed with gelfoam inserted into tract

## 2023-03-05 NOTE — Progress Notes (Signed)
Called and results given to Olegario Messier, wife of patient. Discussed close monitoring of foot and signs of infection. Recommend elevating foot when sitting. They will call and schedule with [name] or go to the ED if any worsening.

## 2023-03-05 NOTE — Procedures (Signed)
Interventional Radiology Procedure Note  Procedure: US guided random liver biopsy  Indication: Cirrhosis. Elevated Ferritin.  Findings: Please refer to procedural dictation for full description.  Complications: None  EBL: < 10 mL  Acquanetta Belling, MD [phone]

## 2023-03-08 ENCOUNTER — Ambulatory Visit: Payer: BC Managed Care – PPO

## 2023-03-08 LAB — SURGICAL PATHOLOGY

## 2023-03-09 ENCOUNTER — Encounter (HOSPITAL_COMMUNITY): Payer: BC Managed Care – PPO

## 2023-03-12 ENCOUNTER — Ambulatory Visit (HOSPITAL_COMMUNITY)
Admission: RE | Admit: 2023-03-12 | Discharge: 2023-03-12 | Disposition: A | Discharge status: Home / Self Care | Payer: BC Managed Care – PPO | Source: Ambulatory Visit | Attending: Nephrology | Admitting: Nephrology

## 2023-03-12 VITALS — BP 150/73 | HR 91 | Temp 97.3°F | Resp 17

## 2023-03-12 DIAGNOSIS — N185 Chronic kidney disease, stage 5: Secondary | ICD-10-CM | POA: Insufficient documentation

## 2023-03-12 DIAGNOSIS — D509 Iron deficiency anemia, unspecified: Secondary | ICD-10-CM | POA: Insufficient documentation

## 2023-03-12 DIAGNOSIS — N186 End stage renal disease: Secondary | ICD-10-CM

## 2023-03-12 LAB — POCT HEMOGLOBIN-HEMACUE: Hemoglobin: 12.2 g/dL — ABNORMAL LOW (ref 13.0–17.0)

## 2023-03-12 LAB — RENAL FUNCTION PANEL
Albumin: 3.7 g/dL (ref 3.5–5.0)
Anion gap: 11 (ref 5–15)
BUN: 27 mg/dL — ABNORMAL HIGH (ref 8–23)
CO2: 23 mmol/L (ref 22–32)
Calcium: 8.5 mg/dL — ABNORMAL LOW (ref 8.9–10.3)
Chloride: 104 mmol/L (ref 98–111)
Creatinine, Ser: 2.58 mg/dL — ABNORMAL HIGH (ref 0.61–1.24)
GFR, Estimated: 27 mL/min — ABNORMAL LOW (ref >60)
Glucose, Bld: 135 mg/dL — ABNORMAL HIGH (ref 70–99)
Phosphorus: 3.1 mg/dL (ref 2.5–4.6)
Potassium: 3.3 mmol/L — ABNORMAL LOW (ref 3.5–5.1)
Sodium: 138 mmol/L (ref 135–145)

## 2023-03-12 MED ORDER — EPOETIN ALFA-EPBX 10000 UNIT/ML IJ SOLN: 20000.0000 [IU] | INTRAMUSCULAR | Status: DC

## 2023-03-12 MED ORDER — EPOETIN ALFA-EPBX 10000 UNIT/ML IJ SOLN
INTRAMUSCULAR | Status: AC
  Filled 2023-03-12: qty 2

## 2023-03-15 ENCOUNTER — Encounter: Payer: Self-pay | Admitting: Orthopedic Surgery

## 2023-03-15 ENCOUNTER — Ambulatory Visit: Payer: BC Managed Care – PPO | Admitting: Orthopedic Surgery

## 2023-03-15 DIAGNOSIS — M1A0720 Idiopathic chronic gout, left ankle and foot, without tophus (tophi): Secondary | ICD-10-CM

## 2023-03-15 DIAGNOSIS — Z89432 Acquired absence of left foot: Secondary | ICD-10-CM | POA: Diagnosis not present

## 2023-03-15 DIAGNOSIS — Z89511 Acquired absence of right leg below knee: Secondary | ICD-10-CM

## 2023-03-15 MED ORDER — PREDNISONE 10 MG PO TABS
10.0000 mg | ORAL_TABLET | Freq: Every day | ORAL | 3 refills | Status: DC
Start: 2023-03-15 — End: 2023-07-21

## 2023-03-15 NOTE — Progress Notes (Signed)
Office Visit Note   Patient: Jeffrey Ayala           Date of Birth: 1961/01/07           MRN: [id number] Visit Date: 03/15/2023              Requested by: Avanell Shackleton, NP-C [address] [place],  [place] [address] PCP: Avanell Shackleton, NP-C  Chief Complaint  Patient presents with   Left Foot - Pain    Hx left transmet amputation      HPI: Patient presents with acute gout inflammation on the left.  Patient is 1 year out transmetatarsal amputation  on the left foot.  Patient states he has had a recent history of redness pain and swelling, uric acid was elevated his allopurinol was increased to 200 mg a day patient states this did not help so he took some of his prednisone and this completely resolved his symptoms.  Patient states he has a new socket on the right which is working well.  Patient has a stable right transtibial amputation with a new socket.  Uric acid was 9.0.  Assessment & Plan: Assessment:  1. Hx of below knee amputation, right   2. History of transmetatarsal amputation of left foot   3. Idiopathic chronic gout of left foot without tophus     Plan: A prescription was provided for prednisone 10 mg tablets to be used as needed for flareup of gout.  Follow-Up Instructions: No follow-ups on file.   Ortho Exam  Patient is alert, oriented, no adenopathy, well-dressed, normal affect, normal respiratory effort. Examination patient has improved function with his right transtibial amputation improved ability of going up and down stairs.  Left foot shows no redness no swelling no tenderness to palpation no signs of infection the gout symptoms have resolved.  Imaging: No results found. No images are attached to the encounter.  Labs: Lab Results  Component Value Date   HGBA1C 5.5 02/23/2022   ESRSEDRATE 20 (H) 02/22/2022   CRP 13.8 (H) 02/23/2022   LABURIC 9.0 (H) 03/05/2023   LABURIC 8.5 (H) 05/27/2022   LABURIC 6.3 11/14/2018   REPTSTATUS  04/05/2022 FINAL 03/16/2022   GRAMSTAIN  03/16/2022    NO WBC SEEN ABUNDANT GRAM POSITIVE COCCI FEW GRAM NEGATIVE RODS    CULT  03/16/2022    ABUNDANT ENTEROCOCCUS FAECALIS FEW PSEUDOMONAS AERUGINOSA MULTI-DRUG RESISTANT ORGANISM ABUNDANT BACTEROIDES OVATUS BETA LACTAMASE POSITIVE CRITICAL RESULT CALLED TO, READ BACK BY AND VERIFIED WITH: PHARMD K PIERCE [id number] AT 1019 BY CM Sent to [facility] for further susceptibility testing. SEE REPORT IN EPIC Performed at [facility], [address], [place], [place] [address]    [facility] ENTEROCOCCUS FAECALIS 03/16/2022   LABORGA PSEUDOMONAS AERUGINOSA 03/16/2022     Lab Results  Component Value Date   ALBUMIN 3.7 03/12/2023   ALBUMIN 4.1 03/05/2023   ALBUMIN 3.9 02/12/2023    Lab Results  Component Value Date   MG 1.9 04/18/2022   MG 2.1 04/01/2022   MG 2.0 03/31/2022   No results found for: "VD25OH"  No results found for: "PREALBUMIN"    Latest Ref Rng & Units 03/12/2023    8:59 AM 03/05/2023   11:30 AM 03/05/2023   10:01 AM  CBC EXTENDED  WBC 4.0 - 10.5 K/uL  8.0  8.3   RBC 4.22 - 5.81 MIL/uL  3.47  3.54   Hemoglobin 13.0 - 17.0 g/dL 13.0  86.5  11.5  HCT 39.0 - 52.0 %  33.4  33.3   Platelets 150 - 400 K/uL  201  238.0   NEUT# 1.7 - 7.7 K/uL  6.3  6.4   Lymph# 0.7 - 4.0 K/uL  1.1  1.2      There is no height or weight on file to calculate BMI.  Orders:  No orders of the defined types were placed in this encounter.  No orders of the defined types were placed in this encounter.    Procedures: No procedures performed  Clinical Data: No additional findings.  ROS:  All other systems negative, except as noted in the HPI. Review of Systems  Objective: Vital Signs: There were no vitals taken for this visit.  Specialty Comments:  No specialty comments available.  PMFS History: Patient Active Problem List   Diagnosis Date Noted   Pruritic rash 03/05/2023   Left foot pain 03/05/2023   OSA (obstructive  sleep apnea) 11/08/2022   Benign neoplasm of colon 10/26/2022   Gastric polyp 10/26/2022   Suspected sleep apnea 10/01/2022   Daytime somnolence 10/01/2022   Snoring 10/01/2022   Acute bilateral obstructive uropathy 08/15/2022   Skin breakdown 07/29/2022   Suicidal ideations 06/09/2022   Chills (without fever) 06/09/2022   Severe depression 06/09/2022   Acute cough 06/09/2022   End stage renal disease 06/02/2022   Reactive depression 05/31/2022   Belching 05/27/2022   Gastroesophageal reflux disease 05/27/2022   Atrial fibrillation 05/20/2022   Long term (current) use of anticoagulants 05/20/2022   S/P transmetatarsal amputation of foot, left 04/18/2022   CKD (chronic kidney disease) stage 5, GFR less than 15 ml/min 04/18/2022   Secondary hyperparathyroidism of renal origin 04/10/2022   Pruritus, unspecified 04/10/2022   Nausea 04/10/2022   Iron deficiency anemia, unspecified 04/10/2022   Hypothyroidism, unspecified 04/10/2022   Other acute kidney failure 04/10/2022   Dyspnea, unspecified 04/10/2022   Eosinophilic esophagitis 04/10/2022   Gastric ulcer, unspecified as acute or chronic, without hemorrhage or perforation 04/10/2022   Coagulation defect, unspecified 04/10/2022   Allergy, unspecified, initial encounter 04/10/2022   Anaphylactic shock, unspecified, initial encounter 04/10/2022   Arthralgia of right lower leg    Acute blood loss anemia    Unilateral complete BKA, right, initial encounter 04/03/2022   S/P BKA (below knee amputation), right 04/03/2022   Old myocardial infarction 04/02/2022   Dependence on renal dialysis 04/02/2022   Acquired absence of right leg below knee 04/02/2022   Cirrhosis 03/31/2022   Fournier's gangrene in male    Scrotal infection 03/25/2022   Anemia 03/25/2022   Diarrhea 03/25/2022   Shock liver 03/25/2022   Acute respiratory failure with hypoxia 03/25/2022   Gout 03/25/2022   Acute nonintractable headache 03/25/2022   Demand  ischemia 03/25/2022   Thrombocytopenia 03/25/2022   Obesity, Class III, BMI 40-49.9 (morbid obesity) 03/25/2022   Cardiac arrest 03/25/2022   Hypotension    Respiratory insufficiency    Cellulitis    Malnutrition of moderate degree 03/12/2022   Altered mental status    Gangrene of right foot    Gangrene of left foot    Severe protein-calorie malnutrition 03/10/2022   Colon cancer screening    Pressure injury of skin 02/23/2022   Septic shock 02/22/2022   AKI (acute kidney injury)    Generalized abdominal pain    Lactic acidosis    Atrial fibrillation with RVR    Hypokalemia    Hypomagnesemia    Pain in finger of right hand 08/14/2021  Degenerative joint disease of shoulder region 05/07/2020   Olecranon bursitis of right elbow 05/07/2020   Pain in joint of right shoulder 04/26/2020   S/P TKR (total knee replacement), bilateral 11/04/2018   Bilateral primary osteoarthritis of knee    DDD (degenerative disc disease), cervical 01/10/2018   Kidney stones 06/22/2016   Essential hypertension 08/01/2010   DYSTHYMIC DISORDER 07/08/2010   PALPITATIONS 07/08/2010   Past Medical History:  Diagnosis Date   A-fib    Acute nonintractable headache 03/25/2022   AKI (acute kidney injury)    Anemia    associated with chronic renal disease   Arthritis    Atrial fibrillation, chronic    not on [facility]   Cardiac arrest 02/2022   in the setting of septic shock   Cirrhosis of liver    Concussion    Dysrhythmia    afib   Eosinophilic esophagitis    EGD/bx by Dr. 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   Type 2 diabetes mellitus with stage 4 chronic kidney disease     Family History  Problem Relation Age of Onset   Arrhythmia Mother    COPD  Mother    Diabetes Mother    Kidney disease Mother    Gout Father    Kidney disease Father    Sarcoidosis Brother    Cancer Brother    Stomach cancer Neg Hx    Pancreatic cancer Neg Hx    Esophageal cancer Neg Hx    Colon cancer Neg Hx    Rectal cancer Neg Hx     Past Surgical History:  Procedure Laterality Date   AMPUTATION Right 03/15/2022   Procedure: RIGHT BELOW KNEE AMPUTATION;  Surgeon: Nadara Mustard, MD;  Location: [facility] OR;  Service: Orthopedics;  Laterality: Right;   AV FISTULA PLACEMENT Left 08/28/2022   Procedure: LEFT ARM ARTERIOVENOUS (AV) FISTULA CREATION;  Surgeon: Maeola Harman, MD;  Location: [facility] OR;  Service: Vascular;  Laterality: Left;   BIOPSY  10/26/2022   Procedure: BIOPSY;  Surgeon: Benancio Deeds, MD;  Location[facility];  Service: Gastroenterology;;   CARDIOVERSION N/A 11/20/2022   Procedure: CARDIOVERSION;  Surgeon: Meriam Sprague, MD;  Location: [facility] ENDOSCOPY;  Service: Cardiovascular;  Laterality: N/A;   COLONOSCOPY WITH PROPOFOL N/A 10/26/2022   Procedure: COLONOSCOPY WITH PROPOFOL;  Surgeon: Benancio Deeds, MD;  Location: [facility];  Service: Gastroenterology;  Laterality: N/A;   CYSTOSCOPY     CYSTOSCOPY WITH STENT PLACEMENT Bilateral 08/15/2022   Procedure: CYSTOSCOPY WITH STENT PLACEMENT;  Surgeon: Belva Agee, MD;  Location: [facility] OR;  Service: Urology;  Laterality: Bilateral;   CYSTOSCOPY/URETEROSCOPY/HOLMIUM LASER/STENT PLACEMENT Bilateral 09/14/2022   Procedure: CYSTOSCOPY/URETEROSCOPY/HOLMIUM LASER/STENT PLACEMENT;  Surgeon: Heloise Purpura, MD;  Location: [facility] ORS;  Service: Urology;  Laterality: Bilateral;   DENTAL SURGERY     ESOPHAGOGASTRODUODENOSCOPY (EGD) WITH PROPOFOL N/A 10/26/2022   Procedure: ESOPHAGOGASTRODUODENOSCOPY (EGD) WITH PROPOFOL;  Surgeon: Benancio Deeds, MD;  Location: [facility];  Service: Gastroenterology;  Laterality: N/A;   FINGER SURGERY     HERNIA REPAIR     at age 72   IR FLUORO  GUIDE CV LINE RIGHT  03/09/2022   IR PATIENT EVAL  TECH 0-60 MINS  03/09/2022   IR US GUIDE VASC ACCESS RIGHT  03/09/2022   KNEE ARTHROSCOPY Left    LITHOTRIPSY     NEPHROLITHOTOMY     POLYPECTOMY  10/26/2022   Procedure: POLYPECTOMY;  Surgeon: Benancio Deeds, MD;  Location: [facility];  Service: Gastroenterology;;   Karen Kays EXPLORATION N/A 03/16/2022   Procedure: IRRIGATION AND DEBRIDEMENT SCROTUM;  Surgeon: Heloise Purpura, MD;  Location: [facility] OR;  Service: Urology;  Laterality: N/A;   SCROTAL EXPLORATION N/A 03/28/2022   Procedure: IRRIGATION AND DEBRIDEMENT SCROTUM;  Surgeon: Crist Fat, MD;  Location: [facility] OR;  Service: Urology;  Laterality: N/A;   TONSILLECTOMY     TOTAL KNEE ARTHROPLASTY Bilateral 11/04/2018   Procedure: BILATERAL TOTAL KNEE ARTHROPLASTY;  Surgeon: Nadara Mustard, MD;  Location: [facility] OR;  Service: Orthopedics;  Laterality: Bilateral;  spinal/epidural per anesthesiologist   TRANSMETATARSAL AMPUTATION Left 03/15/2022   Procedure: LEFT TRANSMETATARSAL AMPUTATION;  Surgeon: Nadara Mustard, MD;  Location: [facility] OR;  Service: Orthopedics;  Laterality: Left;   Social History   Occupational History   Occupation: Physiological scientist  Tobacco Use   Smoking status: Never   Smokeless tobacco: Never  Vaping Use   Vaping Use: Never used  Substance and Sexual Activity   Alcohol use: Not Currently   Drug use: No   Sexual activity: Not Currently

## 2023-03-18 ENCOUNTER — Other Ambulatory Visit: Payer: Self-pay | Admitting: Family Medicine

## 2023-03-18 DIAGNOSIS — M109 Gout, unspecified: Secondary | ICD-10-CM

## 2023-03-24 ENCOUNTER — Ambulatory Visit (INDEPENDENT_AMBULATORY_CARE_PROVIDER_SITE_OTHER): Payer: BC Managed Care – PPO | Admitting: Gastroenterology

## 2023-03-24 DIAGNOSIS — Z992 Dependence on renal dialysis: Secondary | ICD-10-CM

## 2023-03-24 DIAGNOSIS — K7460 Unspecified cirrhosis of liver: Secondary | ICD-10-CM

## 2023-03-24 DIAGNOSIS — D649 Anemia, unspecified: Secondary | ICD-10-CM

## 2023-03-24 DIAGNOSIS — N186 End stage renal disease: Secondary | ICD-10-CM

## 2023-03-24 DIAGNOSIS — Z23 Encounter for immunization: Secondary | ICD-10-CM

## 2023-03-26 ENCOUNTER — Encounter (HOSPITAL_COMMUNITY)
Admission: RE | Admit: 2023-03-26 | Discharge: 2023-03-26 | Disposition: A | Discharge status: Home / Self Care | Payer: BC Managed Care – PPO | Source: Ambulatory Visit | Attending: Nephrology | Admitting: Nephrology

## 2023-03-26 VITALS — BP 135/71 | HR 94 | Temp 97.6°F | Resp 17

## 2023-03-26 DIAGNOSIS — N185 Chronic kidney disease, stage 5: Secondary | ICD-10-CM | POA: Insufficient documentation

## 2023-03-26 DIAGNOSIS — N186 End stage renal disease: Secondary | ICD-10-CM | POA: Diagnosis not present

## 2023-03-26 DIAGNOSIS — D631 Anemia in chronic kidney disease: Secondary | ICD-10-CM | POA: Insufficient documentation

## 2023-03-26 DIAGNOSIS — D509 Iron deficiency anemia, unspecified: Secondary | ICD-10-CM | POA: Insufficient documentation

## 2023-03-26 LAB — POCT HEMOGLOBIN-HEMACUE: Hemoglobin: 13.8 g/dL (ref 13.0–17.0)

## 2023-03-26 MED ORDER — EPOETIN ALFA-EPBX 10000 UNIT/ML IJ SOLN: 20000.0000 [IU] | INTRAMUSCULAR | Status: DC

## 2023-03-29 ENCOUNTER — Ambulatory Visit (HOSPITAL_COMMUNITY): Payer: BC Managed Care – PPO

## 2023-04-09 ENCOUNTER — Encounter (HOSPITAL_COMMUNITY): Payer: BC Managed Care – PPO

## 2023-04-12 ENCOUNTER — Encounter (HOSPITAL_COMMUNITY)
Admission: RE | Admit: 2023-04-12 | Discharge: 2023-04-12 | Disposition: A | Discharge status: Home / Self Care | Payer: BC Managed Care – PPO | Source: Ambulatory Visit | Attending: Nephrology | Admitting: Nephrology

## 2023-04-12 VITALS — BP 151/86 | HR 97 | Temp 97.6°F | Resp 18

## 2023-04-12 DIAGNOSIS — N186 End stage renal disease: Secondary | ICD-10-CM | POA: Diagnosis not present

## 2023-04-12 DIAGNOSIS — N185 Chronic kidney disease, stage 5: Secondary | ICD-10-CM

## 2023-04-12 DIAGNOSIS — D509 Iron deficiency anemia, unspecified: Secondary | ICD-10-CM | POA: Diagnosis not present

## 2023-04-12 LAB — CBC WITH DIFFERENTIAL/PLATELET
Abs Immature Granulocytes: 0.2 10*3/uL — ABNORMAL HIGH (ref 0.00–0.07)
Basophils Absolute: 0 10*3/uL (ref 0.0–0.1)
Basophils Relative: 0 %
Eosinophils Absolute: 0 10*3/uL (ref 0.0–0.5)
Eosinophils Relative: 0 %
HCT: 35.1 % — ABNORMAL LOW (ref 39.0–52.0)
Hemoglobin: 11.5 g/dL — ABNORMAL LOW (ref 13.0–17.0)
Immature Granulocytes: 3 %
Lymphocytes Relative: 18 %
Lymphs Abs: 1.4 10*3/uL (ref 0.7–4.0)
MCH: 31.4 pg (ref 26.0–34.0)
MCHC: 32.8 g/dL (ref 30.0–36.0)
MCV: 95.9 fL (ref 80.0–100.0)
Monocytes Absolute: 0.4 10*3/uL (ref 0.1–1.0)
Monocytes Relative: 6 %
Neutro Abs: 5.5 10*3/uL (ref 1.7–7.7)
Neutrophils Relative %: 73 %
Platelets: 213 10*3/uL (ref 150–400)
RBC: 3.66 MIL/uL — ABNORMAL LOW (ref 4.22–5.81)
RDW: 15.9 % — ABNORMAL HIGH (ref 11.5–15.5)
WBC: 7.5 10*3/uL (ref 4.0–10.5)
nRBC: 0 % (ref 0.0–0.2)

## 2023-04-12 LAB — RENAL FUNCTION PANEL
Albumin: 3.9 g/dL (ref 3.5–5.0)
Anion gap: 12 (ref 5–15)
BUN: 24 mg/dL — ABNORMAL HIGH (ref 8–23)
CO2: 23 mmol/L (ref 22–32)
Calcium: 8.9 mg/dL (ref 8.9–10.3)
Chloride: 104 mmol/L (ref 98–111)
Creatinine, Ser: 2.92 mg/dL — ABNORMAL HIGH (ref 0.61–1.24)
GFR, Estimated: 24 mL/min — ABNORMAL LOW (ref >60)
Glucose, Bld: 153 mg/dL — ABNORMAL HIGH (ref 70–99)
Phosphorus: 3.8 mg/dL (ref 2.5–4.6)
Potassium: 3.9 mmol/L (ref 3.5–5.1)
Sodium: 139 mmol/L (ref 135–145)

## 2023-04-12 LAB — POCT HEMOGLOBIN-HEMACUE: Hemoglobin: 11.7 g/dL — ABNORMAL LOW (ref 13.0–17.0)

## 2023-04-12 MED ORDER — EPOETIN ALFA-EPBX 10000 UNIT/ML IJ SOLN
INTRAMUSCULAR | Status: AC
  Administered 2023-04-12: 20000 [IU]
  Filled 2023-04-12: qty 2

## 2023-04-12 MED ORDER — EPOETIN ALFA-EPBX 10000 UNIT/ML IJ SOLN: 20000.0000 [IU] | INTRAMUSCULAR | Status: DC

## 2023-04-14 ENCOUNTER — Encounter (HOSPITAL_COMMUNITY): Payer: Self-pay

## 2023-04-14 ENCOUNTER — Other Ambulatory Visit (HOSPITAL_COMMUNITY): Payer: Self-pay

## 2023-04-16 DIAGNOSIS — N184 Chronic kidney disease, stage 4 (severe): Secondary | ICD-10-CM | POA: Diagnosis not present

## 2023-04-23 ENCOUNTER — Encounter (HOSPITAL_COMMUNITY): Payer: BC Managed Care – PPO

## 2023-05-07 ENCOUNTER — Encounter (HOSPITAL_COMMUNITY): Payer: BC Managed Care – PPO

## 2023-05-10 ENCOUNTER — Ambulatory Visit (HOSPITAL_COMMUNITY)
Admission: RE | Admit: 2023-05-10 | Discharge: 2023-05-10 | Disposition: A | Discharge status: Home / Self Care | Payer: BC Managed Care – PPO | Source: Ambulatory Visit | Attending: Nephrology | Admitting: Nephrology

## 2023-05-10 VITALS — BP 150/81 | HR 82 | Temp 97.1°F | Resp 18

## 2023-05-10 DIAGNOSIS — N186 End stage renal disease: Secondary | ICD-10-CM | POA: Diagnosis not present

## 2023-05-10 DIAGNOSIS — D509 Iron deficiency anemia, unspecified: Secondary | ICD-10-CM

## 2023-05-10 DIAGNOSIS — N185 Chronic kidney disease, stage 5: Secondary | ICD-10-CM | POA: Diagnosis not present

## 2023-05-10 DIAGNOSIS — D631 Anemia in chronic kidney disease: Secondary | ICD-10-CM | POA: Insufficient documentation

## 2023-05-10 LAB — RENAL FUNCTION PANEL
Albumin: 3.7 g/dL (ref 3.5–5.0)
Anion gap: 15 (ref 5–15)
BUN: 17 mg/dL (ref 8–23)
CO2: 21 mmol/L — ABNORMAL LOW (ref 22–32)
Calcium: 8.8 mg/dL — ABNORMAL LOW (ref 8.9–10.3)
Chloride: 101 mmol/L (ref 98–111)
Creatinine, Ser: 2.59 mg/dL — ABNORMAL HIGH (ref 0.61–1.24)
GFR, Estimated: 27 mL/min — ABNORMAL LOW (ref >60)
Glucose, Bld: 147 mg/dL — ABNORMAL HIGH (ref 70–99)
Phosphorus: 2.7 mg/dL (ref 2.5–4.6)
Potassium: 3.4 mmol/L — ABNORMAL LOW (ref 3.5–5.1)
Sodium: 137 mmol/L (ref 135–145)

## 2023-05-10 LAB — FERRITIN: Ferritin: 697 ng/mL — ABNORMAL HIGH (ref 24–336)

## 2023-05-10 LAB — CBC WITH DIFFERENTIAL/PLATELET
Abs Immature Granulocytes: 0.06 10*3/uL (ref 0.00–0.07)
Basophils Absolute: 0 10*3/uL (ref 0.0–0.1)
Basophils Relative: 0 %
Eosinophils Absolute: 0 10*3/uL (ref 0.0–0.5)
Eosinophils Relative: 0 %
HCT: 35.4 % — ABNORMAL LOW (ref 39.0–52.0)
Hemoglobin: 11.6 g/dL — ABNORMAL LOW (ref 13.0–17.0)
Immature Granulocytes: 1 %
Lymphocytes Relative: 13 %
Lymphs Abs: 1.1 10*3/uL (ref 0.7–4.0)
MCH: 30.6 pg (ref 26.0–34.0)
MCHC: 32.8 g/dL (ref 30.0–36.0)
MCV: 93.4 fL (ref 80.0–100.0)
Monocytes Absolute: 0.3 10*3/uL (ref 0.1–1.0)
Monocytes Relative: 4 %
Neutro Abs: 6.9 10*3/uL (ref 1.7–7.7)
Neutrophils Relative %: 82 %
Platelets: 205 10*3/uL (ref 150–400)
RBC: 3.79 MIL/uL — ABNORMAL LOW (ref 4.22–5.81)
RDW: 16 % — ABNORMAL HIGH (ref 11.5–15.5)
WBC: 8.4 10*3/uL (ref 4.0–10.5)
nRBC: 0 % (ref 0.0–0.2)

## 2023-05-10 LAB — IRON AND TIBC
Iron: 73 ug/dL (ref 45–182)
Saturation Ratios: 32 % (ref 17.9–39.5)
TIBC: 231 ug/dL — ABNORMAL LOW (ref 250–450)
UIBC: 158 ug/dL

## 2023-05-10 LAB — POCT HEMOGLOBIN-HEMACUE: Hemoglobin: 11.9 g/dL — ABNORMAL LOW (ref 13.0–17.0)

## 2023-05-10 MED ORDER — EPOETIN ALFA-EPBX 10000 UNIT/ML IJ SOLN
INTRAMUSCULAR | Status: AC
  Filled 2023-05-10: qty 2

## 2023-05-10 MED ORDER — EPOETIN ALFA-EPBX 10000 UNIT/ML IJ SOLN
20000.0000 [IU] | INTRAMUSCULAR | Status: DC
  Administered 2023-05-10: 20000 [IU] via SUBCUTANEOUS

## 2023-05-11 ENCOUNTER — Telehealth: Payer: Self-pay

## 2023-05-11 NOTE — Telephone Encounter (Signed)
Patient has an appointment with [facility] on 05-18-23 at 11am

## 2023-05-12 DIAGNOSIS — K760 Fatty (change of) liver, not elsewhere classified: Secondary | ICD-10-CM | POA: Diagnosis not present

## 2023-05-18 ENCOUNTER — Other Ambulatory Visit: Payer: Self-pay | Admitting: Nurse Practitioner

## 2023-05-18 ENCOUNTER — Other Ambulatory Visit: Payer: Self-pay | Admitting: Family Medicine

## 2023-05-18 ENCOUNTER — Other Ambulatory Visit: Payer: Self-pay | Admitting: Cardiovascular Disease

## 2023-05-18 ENCOUNTER — Other Ambulatory Visit: Payer: Self-pay | Admitting: Family

## 2023-05-18 DIAGNOSIS — L282 Other prurigo: Secondary | ICD-10-CM

## 2023-05-18 DIAGNOSIS — K7469 Other cirrhosis of liver: Secondary | ICD-10-CM | POA: Diagnosis not present

## 2023-05-18 DIAGNOSIS — K7460 Unspecified cirrhosis of liver: Secondary | ICD-10-CM

## 2023-05-18 DIAGNOSIS — M109 Gout, unspecified: Secondary | ICD-10-CM

## 2023-05-18 DIAGNOSIS — K7581 Nonalcoholic steatohepatitis (NASH): Secondary | ICD-10-CM | POA: Diagnosis not present

## 2023-06-07 ENCOUNTER — Ambulatory Visit (HOSPITAL_COMMUNITY)
Admission: RE | Admit: 2023-06-07 | Discharge: 2023-06-07 | Disposition: A | Discharge status: Home / Self Care | Payer: BC Managed Care – PPO | Source: Ambulatory Visit | Attending: Nephrology | Admitting: Nephrology

## 2023-06-07 VITALS — BP 154/80 | HR 100 | Temp 97.9°F | Resp 18

## 2023-06-07 DIAGNOSIS — D509 Iron deficiency anemia, unspecified: Secondary | ICD-10-CM | POA: Insufficient documentation

## 2023-06-07 DIAGNOSIS — N185 Chronic kidney disease, stage 5: Secondary | ICD-10-CM | POA: Diagnosis not present

## 2023-06-07 DIAGNOSIS — N186 End stage renal disease: Secondary | ICD-10-CM | POA: Insufficient documentation

## 2023-06-07 LAB — POCT HEMOGLOBIN-HEMACUE: Hemoglobin: 12 g/dL — ABNORMAL LOW (ref 13.0–17.0)

## 2023-06-07 LAB — CBC WITH DIFFERENTIAL/PLATELET
Abs Immature Granulocytes: 0.06 10*3/uL (ref 0.00–0.07)
Basophils Absolute: 0 10*3/uL (ref 0.0–0.1)
Basophils Relative: 0 %
Eosinophils Absolute: 0 10*3/uL (ref 0.0–0.5)
Eosinophils Relative: 0 %
HCT: 38.3 % — ABNORMAL LOW (ref 39.0–52.0)
Hemoglobin: 12.3 g/dL — ABNORMAL LOW (ref 13.0–17.0)
Immature Granulocytes: 1 %
Lymphocytes Relative: 18 %
Lymphs Abs: 1.4 10*3/uL (ref 0.7–4.0)
MCH: 30.7 pg (ref 26.0–34.0)
MCHC: 32.1 g/dL (ref 30.0–36.0)
MCV: 95.5 fL (ref 80.0–100.0)
Monocytes Absolute: 0.4 10*3/uL (ref 0.1–1.0)
Monocytes Relative: 5 %
Neutro Abs: 6.1 10*3/uL (ref 1.7–7.7)
Neutrophils Relative %: 76 %
Platelets: 198 10*3/uL (ref 150–400)
RBC: 4.01 MIL/uL — ABNORMAL LOW (ref 4.22–5.81)
RDW: 15.8 % — ABNORMAL HIGH (ref 11.5–15.5)
WBC: 8.1 10*3/uL (ref 4.0–10.5)
nRBC: 0 % (ref 0.0–0.2)

## 2023-06-07 LAB — RENAL FUNCTION PANEL
Albumin: 3.7 g/dL (ref 3.5–5.0)
Anion gap: 9 (ref 5–15)
BUN: 18 mg/dL (ref 8–23)
CO2: 23 mmol/L (ref 22–32)
Calcium: 8.9 mg/dL (ref 8.9–10.3)
Chloride: 104 mmol/L (ref 98–111)
Creatinine, Ser: 2.43 mg/dL — ABNORMAL HIGH (ref 0.61–1.24)
GFR, Estimated: 29 mL/min — ABNORMAL LOW (ref >60)
Glucose, Bld: 163 mg/dL — ABNORMAL HIGH (ref 70–99)
Phosphorus: 3.2 mg/dL (ref 2.5–4.6)
Potassium: 3.5 mmol/L (ref 3.5–5.1)
Sodium: 136 mmol/L (ref 135–145)

## 2023-06-07 MED ORDER — EPOETIN ALFA-EPBX 10000 UNIT/ML IJ SOLN
INTRAMUSCULAR | Status: AC
  Filled 2023-06-07: qty 2

## 2023-06-07 MED ORDER — EPOETIN ALFA-EPBX 10000 UNIT/ML IJ SOLN: 20000.0000 [IU] | INTRAMUSCULAR | Status: DC

## 2023-06-08 ENCOUNTER — Other Ambulatory Visit: Payer: Self-pay | Admitting: Family Medicine

## 2023-06-08 ENCOUNTER — Other Ambulatory Visit: Payer: Self-pay | Admitting: Gastroenterology

## 2023-06-08 DIAGNOSIS — L282 Other prurigo: Secondary | ICD-10-CM

## 2023-06-08 DIAGNOSIS — M109 Gout, unspecified: Secondary | ICD-10-CM

## 2023-06-09 DIAGNOSIS — N184 Chronic kidney disease, stage 4 (severe): Secondary | ICD-10-CM | POA: Diagnosis not present

## 2023-06-09 DIAGNOSIS — D631 Anemia in chronic kidney disease: Secondary | ICD-10-CM | POA: Diagnosis not present

## 2023-06-09 DIAGNOSIS — I129 Hypertensive chronic kidney disease with stage 1 through stage 4 chronic kidney disease, or unspecified chronic kidney disease: Secondary | ICD-10-CM | POA: Diagnosis not present

## 2023-06-09 DIAGNOSIS — N200 Calculus of kidney: Secondary | ICD-10-CM | POA: Diagnosis not present

## 2023-06-09 DIAGNOSIS — N179 Acute kidney failure, unspecified: Secondary | ICD-10-CM | POA: Diagnosis not present

## 2023-06-15 ENCOUNTER — Encounter: Payer: Self-pay | Admitting: Family Medicine

## 2023-06-16 ENCOUNTER — Ambulatory Visit (INDEPENDENT_AMBULATORY_CARE_PROVIDER_SITE_OTHER): Payer: BC Managed Care – PPO | Admitting: Internal Medicine

## 2023-06-16 ENCOUNTER — Encounter: Payer: Self-pay | Admitting: Internal Medicine

## 2023-06-16 VITALS — BP 122/64 | HR 76 | Temp 97.6°F | Ht 70.0 in | Wt 231.0 lb

## 2023-06-16 DIAGNOSIS — N492 Inflammatory disorders of scrotum: Secondary | ICD-10-CM | POA: Diagnosis not present

## 2023-06-16 MED ORDER — CEPHALEXIN 500 MG PO CAPS: 500.0000 mg | ORAL_CAPSULE | Freq: Two times a day (BID) | ORAL | 0 refills | Status: AC

## 2023-06-16 NOTE — Assessment & Plan Note (Signed)
There is a small 1 mm circular lesion at base of penis without fluctuance. We will treat for early infection with 1 week keflex 500 mg BID. If no improvement or worsening return. Prior infection with surgery in this area so needs aggressive intervention.

## 2023-06-16 NOTE — Patient Instructions (Signed)
We have sent in the keflex to take 1 pill twice a day for 1 week.

## 2023-06-16 NOTE — Progress Notes (Signed)
   Subjective:   Patient ID: Jeffrey Ayala, male    DOB: 08-May-1961, 62 y.o.   MRN: [id number]  HPI The patient is a 62 YO man coming in for area of redness and swelling at base of penis. Prior scrotum infection/sepsis/surgery 03/2022  Review of Systems  Constitutional: Negative.   HENT: Negative.    Eyes: Negative.   Respiratory:  Negative for cough, chest tightness and shortness of breath.   Cardiovascular:  Negative for chest pain, palpitations and leg swelling.  Gastrointestinal:  Negative for abdominal distention, abdominal pain, constipation, diarrhea, nausea and vomiting.  Genitourinary:  Positive for scrotal swelling.  Musculoskeletal: Negative.   Skin: Negative.   Neurological: Negative.   Psychiatric/Behavioral: Negative.      Objective:  Physical Exam Constitutional:      Appearance: He is well-developed.  HENT:     Head: Normocephalic and atraumatic.  Cardiovascular:     Rate and Rhythm: Normal rate and regular rhythm.  Pulmonary:     Effort: Pulmonary effort is normal. No respiratory distress.     Breath sounds: Normal breath sounds. No wheezing or rales.  Abdominal:     General: Bowel sounds are normal. There is no distension.     Palpations: Abdomen is soft.     Tenderness: There is no abdominal tenderness. There is no rebound.  Genitourinary:    Comments: Lesion at the base of the penis about 1 mm circular with small redness at the head of the area no fluctuance detected Musculoskeletal:     Cervical back: Normal range of motion.  Skin:    General: Skin is warm and dry.  Neurological:     Mental Status: He is alert and oriented to person, place, and time.     Coordination: Coordination normal.     Vitals:   06/16/23 0829  BP: 122/64  Pulse: 76  Temp: 97.6 F (36.4 C)  TempSrc: Temporal  SpO2: 99%  Weight: 231 lb (104.8 kg)  Height: 5\' 10"  (1.778 m)    Assessment & Plan:

## 2023-06-21 ENCOUNTER — Ambulatory Visit (HOSPITAL_COMMUNITY)
Admission: RE | Admit: 2023-06-21 | Discharge: 2023-06-21 | Disposition: A | Discharge status: Home / Self Care | Payer: BC Managed Care – PPO | Source: Ambulatory Visit | Attending: Nephrology | Admitting: Nephrology

## 2023-06-21 VITALS — BP 133/78 | HR 85 | Temp 97.1°F | Resp 17

## 2023-06-21 DIAGNOSIS — D509 Iron deficiency anemia, unspecified: Secondary | ICD-10-CM | POA: Diagnosis not present

## 2023-06-21 DIAGNOSIS — N186 End stage renal disease: Secondary | ICD-10-CM | POA: Diagnosis not present

## 2023-06-21 DIAGNOSIS — N185 Chronic kidney disease, stage 5: Secondary | ICD-10-CM | POA: Diagnosis not present

## 2023-06-21 LAB — POCT HEMOGLOBIN-HEMACUE: Hemoglobin: 12.3 g/dL — ABNORMAL LOW (ref 13.0–17.0)

## 2023-06-21 MED ORDER — EPOETIN ALFA-EPBX 10000 UNIT/ML IJ SOLN: 20000.0000 [IU] | INTRAMUSCULAR | Status: DC

## 2023-06-24 ENCOUNTER — Encounter: Payer: Self-pay | Admitting: Gastroenterology

## 2023-06-24 ENCOUNTER — Ambulatory Visit: Payer: BC Managed Care – PPO | Admitting: Gastroenterology

## 2023-06-24 VITALS — BP 130/74 | HR 85 | Ht 70.0 in | Wt 234.0 lb

## 2023-06-24 DIAGNOSIS — E669 Obesity, unspecified: Secondary | ICD-10-CM

## 2023-06-24 DIAGNOSIS — E66811 Obesity, class 1: Secondary | ICD-10-CM

## 2023-06-24 DIAGNOSIS — Z6833 Body mass index (BMI) 33.0-33.9, adult: Secondary | ICD-10-CM | POA: Diagnosis not present

## 2023-06-24 DIAGNOSIS — K760 Fatty (change of) liver, not elsewhere classified: Secondary | ICD-10-CM | POA: Diagnosis not present

## 2023-06-24 NOTE — Progress Notes (Signed)
HPI :  62 year old male here for follow-up visit for MAFLD, possible cirrhosis.  Recall  he had a prolonged hospital stay in May and June of 2023, 52 days.  He was septic due to a scrotal infection, developed new onset A-fib leading to anticoagulation use, developed acute kidney injury which progressed to now end-stage renal disease on HD for a period of time.  He also went right BKA and left transmetatarsal amputation. During that time he had imaging which showed cirrhosis of the liver which was a new diagnosis for him.   Workup for liver disease was remarkable for a strongly positive ANA, with a very mildly positive smooth muscle antibody and a normal IgG.  He also had an elevated ferritin > 1000 on a few occasions. Hemochromatosis genetic testing is negative.  Repeat IgG level was normal.  He is immune to hepatitis B but not hepatitis A, got Hep A vaccine. EGD at the hospital in December 2023 which showed no esophageal varices.  Also had interval healing of previous gastric ulcers.  He had a colonoscopy showing a few polyps, nothing high risk.  Given elevated ferritin levels, positive autoimmune serologies, and question of cirrhosis on imaging, he underwent a liver biopsy on April 12.  Colon pathology report as outlined below, had a second opinion with [facility] which is also listed below.  In short, there is no evidence of autoimmune hepatitis, there is only mild iron deposition.  There is NO evidence of cirrhosis.    We got an opinion from [facility], Annamarie Major, who reviewed his file and suspects he potentially developed morphologic changes of cirrhosis on imaging in the setting of shock liver during his hospitalization.  He has MAFLD.  Some fibrotic change noted, they recommended ultrasound in 6 months for development of interval changes and [name] screening.  He denies any alcohol use at all, now or historically.  His liver enzymes have been normal when last checked in April.  His  platelet count is normal.  He had lost some weight during his hospitalization but has since regained the weight.  He has baseline weight is in the 230s with a body mass index of 33-1/2.  He states he does not eat healthy, eats a lot of hamburgers and fries.  Dieting will be very difficult for him.  Wife is in the room today and thinks he needs assistance with weight loss.  He does not drink any coffee.  He remains off hemodialysis at this time, follows closely with nephrology, has stage IV CKD.  On Epogen injections, hemoglobin stable in the 12's.    Prior workup: EGD 01/02/20 -  - Mucosal changes including mild ringed esophagus and longitudinal furrows were found in the middle third of the esophagus and in the lower third of the esophagus. Biopsies were obtained from the proximal and distal esophagus with cold forceps for histology for possible underlying eosinophilic esophagitis. - The exam of the esophagus was otherwise normal. No obvious stenosis / stricture noted. - One cratered gastric ulcer was found at the pylorus. The lesion was 3 mm in largest dimension, associated with significant edema / inflammatory changes. Biopsies were taken with a cold forceps for histology from the periphery of it. - Patchy moderate inflammation characterized by erythema, friability and granularity was found in the gastric antrum. - The exam of the stomach was otherwise normal. - Biopsies were taken with a cold forceps in the gastric body, at the incisura and in the gastric antrum  for Helicobacter pylori testing. - Diffuse moderate inflammation characterized by erythema was found in the distal duodenal bulb, with edema traversing this area into the sweep. Biopsies were taken with a cold forceps for histology. - The exam of the duodenum was otherwise normal.   1. Surgical [P], duodenal - DUODENAL MUCOSA WITH CHANGES CONSISTENT WITH PEPTIC INJURY. - NO FEATURES OF SPRUE OR GRANULOMAS. 2. Surgical [P],  gastric antrum and gastric body - ANTRAL AND OXYNTIC MUCOSA WITH MILDLY ACTIVE CHRONIC INFLAMMATION AND REACTIVE CHANGES. - WARTHIN-STARRY NEGATIVE FOR HELICOBACTER PYLORI. - NO INTESTINAL METAPLASIA, DYSPLASIA OR CARCINOMA. 3. Surgical [P], esophagus - SQUAMOUS MUCOSA WITH REFLUX CHANGES AND INCREASED EOSINOPHILS. - FOCALLY GREATER THAN 20 PER HIGH POWER FIELD. - NO DYSPLASIA OR CARCINOMA. 4. Surgical [P], gastric pylorus, peripheral ulcer - PYLORIC MUCOSA WITH MILDLY ACTIVE INFLAMMATION, HYPEREMIA AND REACTIVE CHANGES. - NO INTESTINAL METAPLASIA, DYSPLASIA OR CARCINOMA.     CT renal stone 03/31/22: IMPRESSION: 1. No acute intra-abdominal or pelvic pathology. 2. Multiple nonobstructing bilateral renal calculi. No hydronephrosis. 3. Colonic diverticulosis. No bowel obstruction. Normal appendix. 4. Cirrhosis with splenomegaly and small perihepatic ascites. 5. Aortic Atherosclerosis (ICD10-I70.0).     CT 02/28/22: IMPRESSION: 1. Severe scrotal edema and bilateral hydroceles, increased compared to prior examination. 2. Ascites and anasarca. 3. Multiple small bilateral nonobstructive renal calculi. No hydronephrosis. 4. Splenomegaly. 5. Cardiomegaly and coronary artery disease.     RUQ [place] 02/24/22: IMPRESSION: 1. Two focal hypoechoic geographic areas seen at the gallbladder fossa, likely due to focal fatty sparing, although liver mass cannot be excluded. Recommend further evaluation with contrast-enhanced liver MRI which can be performed non emergently. 2. Hepatic steatosis. 3. Mildly nodular liver contour, suggestive of cirrhosis.   Hemochromatosis genetic testing negative   RUQ [place] 09/26/22: IMPRESSION: Mild cirrhosis.  No suspicious focal hepatic lesion.     EGD 10/26/22: Esophagogastric landmarks were identified: the Z-line was found at 44 cm, the gastroesophageal junction was found at 44 cm and the upper extent of the gastric folds was found at 44 cm from the incisors.  Findings: The exam of the esophagus was otherwise normal. No varices or active inflammation noted. Multiple small sessile polyps were found in the gastric antrum and in the prepyloric region of the stomach. Biopsies were taken with a cold forceps for histology. The exam of the stomach was otherwise normal. No varices. Interval healing of gastric ulcer. The examined duodenum was normal.     Colonoscopy 10/26/22: - One 3 to 4 mm polyp in the cecum, removed with a cold snare. Resected and retrieved. - Diverticulosis in the left colon and in the right colon. - One 3 mm polyp in the transverse colon, removed with a cold snare. Resected and retrieved. - Two 3 mm polyps in the sigmoid colon, removed with a cold snare. Resected and retrieved. - Internal hemorrhoids. - The examination was otherwise normal   FINAL MICROSCOPIC DIAGNOSIS:   A. STOMACH, POLYPECTOMY:  - Gastric antral mucosa with nonspecific reactive gastropathy  - Helicobacter pylori-like organisms are not identified on routine HE  stain   B. COLON, CECUM, TRANSVERSE, POLYPECTOMY:  - Tubular adenoma(s)  - Negative for high-grade dysplasia or malignancy   C. COLON, SIGMOID, POLYPECTOMY:  - Tubular adenoma  - Negative for high-grade dysplasia or malignancy    recall for EGD in 3 years, colonoscopy in 5 years.      Liver biopsy 03/05/23: FINAL MICROSCOPIC DIAGNOSIS:  A. LIVER, NEEDLE CORE BIOPSY: -  Hepatic parenchyma with mild steatosis (  5 to 10% macrovesicular) and glycogenated nuclei with focal evidence of histiocytes consistent with prior injury. -  Portal triads with minimal chronic inflammatory cells to include scattered plasma cells. -  Hemosiderosis; mild to moderately increased hepatocellular iron deposition with retained gradient (grade 2 of 4) as well as moderately increased Kupffer cell iron.  Note: It is noted that there is a strong positive ANA as well as weakly positive SMA with normal IgG.  There is a mild  amount of portal inflammation that does include a few scattered plasma cells; however, there is no periportal/interface hepatitis present.  Is also noted that the patient has a "compensated cirrhosis;" however, by trichrome staining as well as morphologically there is minimally increased portal fibrosis without evidence of periportal fibrosis or septal formation; however, there are slightly decreased portal tracts for evaluation (4; optimal is 7).  While nodular regeneration is considered the overall hepatic parenchyma does not appear hyperplastic.  There are however abundant central veins present with no evidence of. Sinusoidal/pericellular fibrosis.  There is some evidence of previous parenchymal injury with clusters of histiocytes; however, this is currently nonspecific without ongoing portal or parenchymal acute/chronic hepatitis.  The above findings are overall nonspecific.  Of note there is increased iron deposition both hepatocellular and approximately 2+ as well as prominent Kupffer cell iron deposition. This pattern is against hereditary hemochromatosis given the presence of Kupffer cell iron; however has been seen in the primary/hereditary ferroportin-related overload disorders as well as in secondary causes such as transfusion, hemolysis, and hemodialysis (noted that the patient has been on hemodialysis secondary to end-stage renal disease), amongst others.  Clinical correlation recommended.  Special stains reviewed include trichrome, reticulin, iron and PAS.   BIOPSY RESULTS: [facility] FORMAL READ LIVER, CORE NEEDLE BIOPSY [facility], [phone]):  MILD CHRONIC PORTAL HEPATITIS (SEE COMMENT).  MILD MACROVESICULAR STEATOSIS.  Electronically signed by Kerry Fort, MD on 05/14/2023 at 1418  Comment  The biopsy contains four core fragments of hepatic parenchyma with multiple portal tracts for evaluation. A subset of portal tracts in the biopsy shows mild chronic  inflammation consisting primarily of lymphocytes. Rare plasma cells and eosinophils are also present. These portal tracts also show mild bile ductular proliferation. The hepatic lobules are notable for scattered foci of chronic inflammation as well. There is also mild macrovesicular steatosis with focal ballooning degeneration of hepatocytes. Scattered kupffer cells/histocyte clusters are identified in the lobules as well. A trichrome stain highlights periportal and perisinusoidal fibrosis. A special stain for iron highlights mild (1+) deposition within hepatocytes as well as deposition within kupffer cells. A PAS special stain with diastase digestion, does not highlight intracytoplasmic inclusions within hepatocytes.   The portal inflammation present is nonspecific. Review of the patient's clinical history reveals serologic evidence suggestive of immune hepatitis. Typical histologic features of autoimmune hepatitis are not present in the biopsy. The steatosis present is consistent with a mild toxic/metabolic injury to liver. Finally, the hepatocellular and kupffer cell iron deposition present is consistent with mild secondary iron deposition.     Past Medical History:  Diagnosis Date   A-fib [facility])    Acute nonintractable headache 03/25/2022   AKI (acute kidney injury) ([name])    Anemia    associated with chronic renal disease   Arthritis    Atrial fibrillation, chronic ([name])    not on [facility]   Cardiac arrest ([name]) 02/2022   in the setting of septic shock   Cirrhosis of liver ([name])    Concussion  Dysrhythmia    afib   Eosinophilic esophagitis    EGD/bx by Dr. Adela Lank   ESRD needing dialysis [facility])    GERD (gastroesophageal reflux disease)    History of gastric ulcer    History of kidney stones    Intermittent dysphagia    Scrotal infection 02/2022   Secondary hyperparathyroidism, renal ([name])    Septic shock ([name]) 02/2022   Vasoplegia - > L TMA and R BKA   Type 2 diabetes mellitus with  stage 4 chronic kidney disease ([name])      Past Surgical History:  Procedure Laterality Date   AMPUTATION Right 03/15/2022   Procedure: RIGHT BELOW KNEE AMPUTATION;  Surgeon: Nadara Mustard, MD;  Location: [facility] OR;  Service: Orthopedics;  Laterality: Right;   AV FISTULA PLACEMENT Left 08/28/2022   Procedure: LEFT ARM ARTERIOVENOUS (AV) FISTULA CREATION;  Surgeon: Maeola Harman, MD;  Location: [facility] OR;  Service: Vascular;  Laterality: Left;   BIOPSY  10/26/2022   Procedure: BIOPSY;  Surgeon: Benancio Deeds, MD;  Location: [facility] ENDOSCOPY;  Service: Gastroenterology;;   CARDIOVERSION N/A 11/20/2022   Procedure: CARDIOVERSION;  Surgeon: Meriam Sprague, MD;  Location: [facility] ENDOSCOPY;  Service: Cardiovascular;  Laterality: N/A;   COLONOSCOPY WITH PROPOFOL N/A 10/26/2022   Procedure: COLONOSCOPY WITH PROPOFOL;  Surgeon: Benancio Deeds, MD;  Location: [facility] ENDOSCOPY;  Service: Gastroenterology;  Laterality: N/A;   CYSTOSCOPY     CYSTOSCOPY WITH STENT PLACEMENT Bilateral 08/15/2022   Procedure: CYSTOSCOPY WITH STENT PLACEMENT;  Surgeon: Belva Agee, MD;  Location: [facility] OR;  Service: Urology;  Laterality: Bilateral;   CYSTOSCOPY/URETEROSCOPY/HOLMIUM LASER/STENT PLACEMENT Bilateral 09/14/2022   Procedure: CYSTOSCOPY/URETEROSCOPY/HOLMIUM LASER/STENT PLACEMENT;  Surgeon: Heloise Purpura, MD;  Location: [facility] ORS;  Service: Urology;  Laterality: Bilateral;   DENTAL SURGERY     ESOPHAGOGASTRODUODENOSCOPY (EGD) WITH PROPOFOL N/A 10/26/2022   Procedure: ESOPHAGOGASTRODUODENOSCOPY (EGD) WITH PROPOFOL;  Surgeon: Benancio Deeds, MD;  Location: [facility] ENDOSCOPY;  Service: Gastroenterology;  Laterality: N/A;   FINGER SURGERY     HERNIA REPAIR     at age 31   IR FLUORO GUIDE CV LINE RIGHT  03/09/2022   IR PATIENT EVAL TECH 0-60 MINS  03/09/2022   IR US GUIDE VASC ACCESS RIGHT  03/09/2022   KNEE ARTHROSCOPY Left    LITHOTRIPSY     NEPHROLITHOTOMY     POLYPECTOMY  10/26/2022   Procedure:  POLYPECTOMY;  Surgeon: Benancio Deeds, MD;  Location: [facility];  Service: Gastroenterology;;   Karen Kays EXPLORATION N/A 03/16/2022   Procedure: IRRIGATION AND DEBRIDEMENT SCROTUM;  Surgeon: Heloise Purpura, MD;  Location[facility] OR;  Service: Urology;  Laterality: N/A;   SCROTAL EXPLORATION N/A 03/28/2022   Procedure: IRRIGATION AND DEBRIDEMENT SCROTUM;  Surgeon: Crist Fat, MD;  Location[facility] OR;  Service: Urology;  Laterality: N/A;   TONSILLECTOMY     TOTAL KNEE ARTHROPLASTY Bilateral 11/04/2018   Procedure: BILATERAL TOTAL KNEE ARTHROPLASTY;  Surgeon: Nadara Mustard, MD;  Location: [facility] OR;  Service: Orthopedics;  Laterality: Bilateral;  spinal/epidural per anesthesiologist   TRANSMETATARSAL AMPUTATION Left 03/15/2022   Procedure: LEFT TRANSMETATARSAL AMPUTATION;  Surgeon: Nadara Mustard, MD;  Location: [facility] OR;  Service: Orthopedics;  Laterality: Left;   Family History  Problem Relation Age of Onset   Arrhythmia Mother    COPD Mother    Diabetes Mother    Kidney disease Mother    Gout Father    Kidney disease Father    Sarcoidosis Brother    Cancer  Brother    Stomach cancer Neg Hx    Pancreatic cancer Neg Hx    Esophageal cancer Neg Hx    Colon cancer Neg Hx    Rectal cancer Neg Hx    Social History   Tobacco Use   Smoking status: Never   Smokeless tobacco: Never  Vaping Use   Vaping status: Never Used  Substance Use Topics   Alcohol use: Not Currently   Drug use: No   Current Outpatient Medications  Medication Sig Dispense Refill   acetaminophen (TYLENOL) 500 MG tablet Take 1,000 mg by mouth every 6 (six) hours as needed for moderate pain or headache. Rapid Release     allopurinol (ZYLOPRIM) 100 MG tablet TAKE 1 TABLET BY MOUTH EVERY DAY 30 tablet 2   apixaban (ELIQUIS) 5 MG TABS tablet Take 1 tablet (5 mg total) by mouth 2 (two) times daily. 180 tablet 3   diphenhydramine-acetaminophen (TYLENOL PM) 25-500 MG TABS tablet Take 2 tablets by mouth at bedtime as needed  (sleep).     furosemide (LASIX) 40 MG tablet Take 40 mg by mouth daily.     gabapentin (NEURONTIN) 100 MG capsule TAKE 1 CAPSULE (100 MG TOTAL) BY MOUTH THREE TIMES DAILY. 90 capsule 3   metoprolol tartrate (LOPRESSOR) 25 MG tablet TAKE 0.5 TABLETS BY MOUTH 2 TIMES DAILY. 90 tablet 2   mupirocin ointment (BACTROBAN) 2 % APPLY TO AFFECTED AREA TWICE A DAY 22 g 0   pantoprazole (PROTONIX) 40 MG tablet TAKE 1 TABLET BY MOUTH TWICE A DAY 180 tablet 0   Potassium Chloride ER 20 MEQ TBCR Take 1 tablet by mouth daily.     predniSONE (DELTASONE) 10 MG tablet Take 1 tablet (10 mg total) by mouth daily with breakfast. 30 tablet 3   traZODone (DESYREL) 50 MG tablet TAKE 1/2 OR 1 TABLET BY MOUTH AT BEDTIME AS NEEDED FOR SLEEP 90 tablet 2   triamcinolone (KENALOG) 0.025 % ointment APPLY TO AFFECTED AREA TWICE A DAY 30 g 0   No current facility-administered medications for this visit.   Allergies  Allergen Reactions   Codeine Hives    Insomnia, jittery   Flomax [Tamsulosin Hcl] Nausea Only    Nausea and fatigue     Review of Systems: All systems reviewed and negative except where noted in HPI.   Lab Results  Component Value Date   WBC 8.1 06/07/2023   HGB 12.3 (L) 06/21/2023   HCT 38.3 (L) 06/07/2023   MCV 95.5 06/07/2023   PLT 198 06/07/2023    Lab Results  Component Value Date   NA 136 06/07/2023   CL 104 06/07/2023   K 3.5 06/07/2023   CO2 23 06/07/2023   BUN 18 06/07/2023   CREATININE 2.43 (H) 06/07/2023   GFRNONAA 29 (L) 06/07/2023   CALCIUM 8.9 06/07/2023   PHOS 3.2 06/07/2023   ALBUMIN 3.7 06/07/2023   GLUCOSE 163 (H) 06/07/2023    Lab Results  Component Value Date   ALT 14 03/05/2023   AST 17 03/05/2023   ALKPHOS 84 03/05/2023   BILITOT 0.6 03/05/2023     Physical Exam: BP 130/74   Pulse 85   Ht 5\' 10"  (1.778 m)   Wt 234 lb (106.1 kg) Comment: without weight of his artifical lower leg  BMI 33.58 kg/m  Constitutional: Pleasant,well-developed, male in no acute  distress. Neurological: Alert and oriented to person place and time. Skin: Skin is warm and dry. No rashes noted. Psychiatric: Normal mood and affect.  Behavior is normal.   ASSESSMENT: 62 y.o. male here for assessment of the following  1. Metabolic dysfunction-associated steatotic liver disease (MASLD)   2. Class 1 obesity with serious comorbidity and body mass index (BMI) of 33.0 to 33.9 in adult, unspecified obesity type    History as above, extensive evaluation for suspected cirrhosis at the time based on imaging during prolonged hospital stay.  He had shock liver at the time of hospitalization and imaging.  He had some positive serologies for autoimmune hepatitis as well as a markedly elevated ferritin over thousand, genetic testing for hemochromatosis negative.  Workup led to a liver biopsy which was read by [facility] and second opinion by [facility].  Overall there is no evidence of cirrhosis on the liver biopsy report, there is no evidence of autoimmune hepatitis, and only mild deposition.  He does have fatty liver noted.  Appreciate consultation by hepatology with second opinion on the liver biopsy.  Reviewed the findings with him, this is very fortunate that there is no evidence of cirrhosis but he does have some fibrosis noted.  At risk for worsening fibrosis and cirrhosis over time.  We discussed MAFLD.  He is not using any alcohol.  Recommend weight loss to normalize his body mass index.  We reviewed his nutrition today and this will be very difficult for him to change.  Discussed options, I will refer him to the [facility] health weight loss center to help with this and nutritional advice.  He is agreeable.  Otherwise, given history of fibrosis on biopsy, will do an ultrasound for [facility] screening assess for interval changes.  Due for LFTs in October.  He has 1 more visit with hepatology later in the year and then will see me in 1 year for this issue.  Contact me in the interim with  any questions or concerns.  PLAN: - counseled on liver biopsy results and MAFLD as outlined - order RUQ [place] - rule out cirrhosis / [facility] - recall for LFTs in October - referral to [facility] Weight loss center - he will f/u hepatology once more, can see me yearly  Harlin Rain, MD [facility] Gastroenterology

## 2023-06-24 NOTE — Patient Instructions (Signed)
You have been scheduled for an abdominal ultrasound at [facility] (1st floor of hospital) on Monday, 8-5 at 9:45 am. Please arrive 30 minutes prior to your appointment for registration. Make certain not to have anything to eat or drink 6 hours prior to your appointment. Should you need to reschedule your appointment, please contact radiology at [phone]. This test typically takes about 30 minutes to perform.  You will be due for labs in October. We will remind you when it is time to have these done.  We are referring you to [facility].  They will contact you directly to schedule an appointment.  It may take a week or more before you hear from them.  Please feel free to contact us if you have not heard from them within 2 weeks and we will follow up on the referral.   Thank you for entrusting me with your care and for choosing [facility], Dr. Ileene Patrick    If your blood pressure at your visit was 140/90 or greater, please contact your primary care physician to follow up on this. ______________________________________________________  If you are age 78 or older, your body mass index should be between 23-30. Your Body mass index is 33.58 kg/m. If this is out of the aforementioned range listed, please consider follow up with your Primary Care Provider.  If you are age 74 or younger, your body mass index should be between 19-25. Your Body mass index is 33.58 kg/m. If this is out of the aformentioned range listed, please consider follow up with your Primary Care Provider.  ________________________________________________________  The [facility] providers would like to encourage you to use [facility] to communicate with providers for non-urgent requests or questions.  Due to long hold times on the telephone, sending your provider a message by [facility] may be a faster and more efficient way to get a response.  Please allow 48 business hours for a response.   Please remember that this is for non-urgent requests.  _______________________________________________________  Due to recent changes in healthcare laws, you may see the results of your imaging and laboratory studies on MyChart before your provider has had a chance to review them.  We understand that in some cases there may be results that are confusing or concerning to you. Not all laboratory results come back in the same time frame and the provider may be waiting for multiple results in order to interpret others.  Please give [place] 48 hours in order for your provider to thoroughly review all the results before contacting the office for clarification of your results.

## 2023-06-25 DIAGNOSIS — N200 Calculus of kidney: Secondary | ICD-10-CM | POA: Diagnosis not present

## 2023-06-25 DIAGNOSIS — N492 Inflammatory disorders of scrotum: Secondary | ICD-10-CM | POA: Diagnosis not present

## 2023-06-28 ENCOUNTER — Ambulatory Visit (HOSPITAL_COMMUNITY)
Admission: RE | Admit: 2023-06-28 | Discharge: 2023-06-28 | Disposition: A | Discharge status: Home / Self Care | Payer: BC Managed Care – PPO | Source: Ambulatory Visit | Attending: Gastroenterology | Admitting: Gastroenterology

## 2023-06-28 DIAGNOSIS — K760 Fatty (change of) liver, not elsewhere classified: Secondary | ICD-10-CM | POA: Diagnosis not present

## 2023-06-28 DIAGNOSIS — E669 Obesity, unspecified: Secondary | ICD-10-CM | POA: Diagnosis not present

## 2023-06-28 DIAGNOSIS — Z0189 Encounter for other specified special examinations: Secondary | ICD-10-CM | POA: Diagnosis not present

## 2023-06-28 DIAGNOSIS — Z6833 Body mass index (BMI) 33.0-33.9, adult: Secondary | ICD-10-CM | POA: Insufficient documentation

## 2023-07-05 ENCOUNTER — Ambulatory Visit (HOSPITAL_COMMUNITY)
Admission: RE | Admit: 2023-07-05 | Discharge: 2023-07-05 | Disposition: A | Discharge status: Home / Self Care | Payer: BC Managed Care – PPO | Source: Ambulatory Visit | Attending: Nephrology | Admitting: Nephrology

## 2023-07-05 ENCOUNTER — Encounter (HOSPITAL_COMMUNITY): Payer: BC Managed Care – PPO

## 2023-07-05 VITALS — BP 142/77 | HR 100 | Temp 97.4°F | Resp 18

## 2023-07-05 DIAGNOSIS — N185 Chronic kidney disease, stage 5: Secondary | ICD-10-CM | POA: Diagnosis present

## 2023-07-05 DIAGNOSIS — D631 Anemia in chronic kidney disease: Secondary | ICD-10-CM | POA: Diagnosis not present

## 2023-07-05 DIAGNOSIS — N186 End stage renal disease: Secondary | ICD-10-CM | POA: Diagnosis not present

## 2023-07-05 DIAGNOSIS — D509 Iron deficiency anemia, unspecified: Secondary | ICD-10-CM | POA: Insufficient documentation

## 2023-07-05 LAB — FERRITIN: Ferritin: 638 ng/mL — ABNORMAL HIGH (ref 24–336)

## 2023-07-05 LAB — CBC WITH DIFFERENTIAL/PLATELET
Abs Immature Granulocytes: 0.07 10*3/uL (ref 0.00–0.07)
Basophils Absolute: 0 10*3/uL (ref 0.0–0.1)
Basophils Relative: 0 %
Eosinophils Absolute: 0 10*3/uL (ref 0.0–0.5)
Eosinophils Relative: 0 %
HCT: 37.1 % — ABNORMAL LOW (ref 39.0–52.0)
Hemoglobin: 12.1 g/dL — ABNORMAL LOW (ref 13.0–17.0)
Immature Granulocytes: 1 %
Lymphocytes Relative: 15 %
Lymphs Abs: 1.2 10*3/uL (ref 0.7–4.0)
MCH: 30.3 pg (ref 26.0–34.0)
MCHC: 32.6 g/dL (ref 30.0–36.0)
MCV: 92.8 fL (ref 80.0–100.0)
Monocytes Absolute: 0.4 10*3/uL (ref 0.1–1.0)
Monocytes Relative: 5 %
Neutro Abs: 6.3 10*3/uL (ref 1.7–7.7)
Neutrophils Relative %: 79 %
Platelets: 198 10*3/uL (ref 150–400)
RBC: 4 MIL/uL — ABNORMAL LOW (ref 4.22–5.81)
RDW: 16 % — ABNORMAL HIGH (ref 11.5–15.5)
WBC: 8 10*3/uL (ref 4.0–10.5)
nRBC: 0 % (ref 0.0–0.2)

## 2023-07-05 LAB — RENAL FUNCTION PANEL
Albumin: 3.9 g/dL (ref 3.5–5.0)
Anion gap: 14 (ref 5–15)
BUN: 28 mg/dL — ABNORMAL HIGH (ref 8–23)
CO2: 21 mmol/L — ABNORMAL LOW (ref 22–32)
Calcium: 8.9 mg/dL (ref 8.9–10.3)
Chloride: 101 mmol/L (ref 98–111)
Creatinine, Ser: 2.75 mg/dL — ABNORMAL HIGH (ref 0.61–1.24)
GFR, Estimated: 25 mL/min — ABNORMAL LOW (ref >60)
Glucose, Bld: 153 mg/dL — ABNORMAL HIGH (ref 70–99)
Phosphorus: 3.3 mg/dL (ref 2.5–4.6)
Potassium: 3.4 mmol/L — ABNORMAL LOW (ref 3.5–5.1)
Sodium: 136 mmol/L (ref 135–145)

## 2023-07-05 LAB — POCT HEMOGLOBIN-HEMACUE: Hemoglobin: 11.8 g/dL — ABNORMAL LOW (ref 13.0–17.0)

## 2023-07-05 LAB — IRON AND TIBC
Iron: 55 ug/dL (ref 45–182)
Saturation Ratios: 27 % (ref 17.9–39.5)
TIBC: 207 ug/dL — ABNORMAL LOW (ref 250–450)
UIBC: 152 ug/dL

## 2023-07-05 MED ORDER — EPOETIN ALFA-EPBX 10000 UNIT/ML IJ SOLN
20000.0000 [IU] | INTRAMUSCULAR | Status: DC
  Administered 2023-07-05: 20000 [IU] via SUBCUTANEOUS

## 2023-07-05 MED ORDER — EPOETIN ALFA-EPBX 10000 UNIT/ML IJ SOLN
INTRAMUSCULAR | Status: AC
  Filled 2023-07-05: qty 2

## 2023-07-07 NOTE — Progress Notes (Unsigned)
Cardiology Office Note:   Date:  07/08/2023  NAME:  Jeffrey Ayala    MRN: [id number] DOB:  [birth year]   PCP:  Avanell Shackleton, NP-C  Cardiologist:  Reatha Harps, MD  Electrophysiologist:  None   Referring MD: Avanell Shackleton, NP-C   Chief Complaint  Patient presents with   Follow-up    History of Present Illness:   Jeffrey Ayala is a 62 y.o. male with a hx of pAF, CKD IV, fatty liver, OSA who presents for follow-up.   EKG shows he is maintaining sinus rhythm.  Denies any chest pains or trouble breathing.  Not exercising but looking into other options.  He does not have cirrhosis.  No significant bruising or bleeding on Eliquis.  He does get dizzy upon standing.  We did discuss stopping his metoprolol.  He is maintaining sinus rhythm for quite some time.  No complaints today.  Overall doing well.  Problem List Cardiac Arrest -2/2 septic shock 02/22/2022-04/03/2022 -scrotal infection  -normal echo 2. AKI on HD -TTS perm cath  3. LE gangrene -s/p L transmetatarsal amputation, R BKA -2/2 pressors from shock  4. Paroxysmal Afib -CHADSVASC=2 (CAD, HTN)  -DCCV 11/20/2022 5. Anemia -EGD/colonoscopy negative  6. OSA -severe with hypoxemia 70% 7. Fatty liver   Past Medical History: Past Medical History:  Diagnosis Date   A-fib ([name])    Acute nonintractable headache 03/25/2022   AKI (acute kidney injury) ([name])    Anemia    associated with chronic renal disease   Arthritis    Atrial fibrillation, chronic ([name])    not on [facility]   Cardiac arrest ([name]) 02/2022   in the setting of septic shock   Cirrhosis of liver ([name])    Concussion    Dysrhythmia    afib   Eosinophilic esophagitis    EGD/bx by Dr. 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   Type 2 diabetes mellitus with stage 4 chronic kidney disease ([name])     Past Surgical History: Past Surgical History:  Procedure Laterality Date   AMPUTATION Right 03/15/2022   Procedure: RIGHT BELOW KNEE AMPUTATION;  Surgeon: Nadara Mustard, MD;  Location: [facility] OR;  Service: Orthopedics;  Laterality: Right;   AV FISTULA PLACEMENT Left 08/28/2022   Procedure: LEFT ARM ARTERIOVENOUS (AV) FISTULA CREATION;  Surgeon: Maeola Harman, MD;  Location: [facility] OR;  Service: Vascular;  Laterality: Left;   BIOPSY  10/26/2022   Procedure: BIOPSY;  Surgeon: Benancio Deeds, MD;  Location: [facility] ENDOSCOPY;  Service: Gastroenterology;;   CARDIOVERSION N/A 11/20/2022   Procedure: CARDIOVERSION;  Surgeon: Meriam Sprague, MD;  Location: [facility] ENDOSCOPY;  Service: Cardiovascular;  Laterality: N/A;   COLONOSCOPY WITH PROPOFOL N/A 10/26/2022   Procedure: COLONOSCOPY WITH PROPOFOL;  Surgeon: Benancio Deeds, MD;  Location: [facility] ENDOSCOPY;  Service: Gastroenterology;  Laterality: N/A;   CYSTOSCOPY     CYSTOSCOPY WITH STENT PLACEMENT Bilateral 08/15/2022   Procedure: CYSTOSCOPY WITH STENT PLACEMENT;  Surgeon: Belva Agee, MD;  Location: [facility] OR;  Service: Urology;  Laterality: Bilateral;   CYSTOSCOPY/URETEROSCOPY/HOLMIUM LASER/STENT PLACEMENT Bilateral 09/14/2022   Procedure: CYSTOSCOPY/URETEROSCOPY/HOLMIUM LASER/STENT PLACEMENT;  Surgeon: Heloise Purpura, MD;  Location: [facility] ORS;  Service: Urology;  Laterality: Bilateral;   DENTAL SURGERY     ESOPHAGOGASTRODUODENOSCOPY (EGD) WITH PROPOFOL N/A 10/26/2022   Procedure: ESOPHAGOGASTRODUODENOSCOPY (EGD) WITH PROPOFOL;  Surgeon: Benancio Deeds, MD;  Location: [facility];  Service: Gastroenterology;  Laterality: N/A;   FINGER SURGERY     HERNIA REPAIR     at age 51   IR FLUORO GUIDE CV LINE RIGHT  03/09/2022   IR PATIENT EVAL TECH 0-60 MINS  03/09/2022   IR US GUIDE VASC ACCESS RIGHT  03/09/2022   KNEE ARTHROSCOPY Left     LITHOTRIPSY     NEPHROLITHOTOMY     POLYPECTOMY  10/26/2022   Procedure: POLYPECTOMY;  Surgeon: Benancio Deeds, MD;  Location: [facility];  Service: Gastroenterology;;   Karen Kays EXPLORATION N/A 03/16/2022   Procedure: IRRIGATION AND DEBRIDEMENT SCROTUM;  Surgeon: Heloise Purpura, MD;  Location: [facility] OR;  Service: Urology;  Laterality: N/A;   SCROTAL EXPLORATION N/A 03/28/2022   Procedure: IRRIGATION AND DEBRIDEMENT SCROTUM;  Surgeon: Crist Fat, MD;  Location: [facility] OR;  Service: Urology;  Laterality: N/A;   TONSILLECTOMY     TOTAL KNEE ARTHROPLASTY Bilateral 11/04/2018   Procedure: BILATERAL TOTAL KNEE ARTHROPLASTY;  Surgeon: Nadara Mustard, MD;  Location: [facility] OR;  Service: Orthopedics;  Laterality: Bilateral;  spinal/epidural per anesthesiologist   TRANSMETATARSAL AMPUTATION Left 03/15/2022   Procedure: LEFT TRANSMETATARSAL AMPUTATION;  Surgeon: Nadara Mustard, MD;  Location: [facility] OR;  Service: Orthopedics;  Laterality: Left;    Current Medications: Current Meds  Medication Sig   acetaminophen (TYLENOL) 500 MG tablet Take 1,000 mg by mouth every 6 (six) hours as needed for moderate pain or headache. Rapid Release   allopurinol (ZYLOPRIM) 100 MG tablet TAKE 1 TABLET BY MOUTH EVERY DAY   apixaban (ELIQUIS) 5 MG TABS tablet Take 1 tablet (5 mg total) by mouth 2 (two) times daily.   diphenhydramine-acetaminophen (TYLENOL PM) 25-500 MG TABS tablet Take 2 tablets by mouth at bedtime as needed (sleep).   furosemide (LASIX) 40 MG tablet Take 40 mg by mouth daily.   gabapentin (NEURONTIN) 100 MG capsule TAKE 1 CAPSULE (100 MG TOTAL) BY MOUTH THREE TIMES DAILY.   mupirocin ointment (BACTROBAN) 2 % APPLY TO AFFECTED AREA TWICE A DAY   pantoprazole (PROTONIX) 40 MG tablet TAKE 1 TABLET BY MOUTH TWICE A DAY   Potassium Chloride ER 20 MEQ TBCR Take 1 tablet by mouth daily.   traZODone (DESYREL) 50 MG tablet TAKE 1/2 OR 1 TABLET BY MOUTH AT BEDTIME AS NEEDED FOR SLEEP   triamcinolone (KENALOG)  0.025 % ointment APPLY TO AFFECTED AREA TWICE A DAY   [DISCONTINUED] metoprolol tartrate (LOPRESSOR) 25 MG tablet TAKE 0.5 TABLETS BY MOUTH 2 TIMES DAILY.     Allergies:    Codeine and Flomax [tamsulosin hcl]   Social History: Social History   Socioeconomic History   Marital status: Married    Spouse name: Olegario Messier   Number of children: 2   Years of education: Not on file   Highest education level: Not on file  Occupational History   Occupation: Airline pilot - Chief of Staff  Tobacco Use   Smoking status: Never   Smokeless tobacco: Never  Vaping Use   Vaping status: Never Used  Substance and Sexual Activity   Alcohol use: Not Currently   Drug use: No   Sexual activity: Not Currently  Other Topics Concern   Not on file  Social History Narrative   Not on file  Social Determinants of Health   Financial Resource Strain: Not on file  Food Insecurity: Low Risk  (05/18/2023)   [facility]   Food vital sign    Within the past 12 months, you worried that your food would run out before you got money to buy more: Never true    Within the past 12 months, the food you bought just didn't last and you didn't have money to get more. : Never true  Transportation Needs: Not on file (05/18/2023)  Physical Activity: Not on file  Stress: Not on file  Social Connections: Not on file     Family History: The patient's family history includes Arrhythmia in his mother; COPD in his mother; Cancer in his brother; Diabetes in his mother; Gout in his father; Kidney disease in his father and mother; Sarcoidosis in his brother. There is no history of Stomach cancer, Pancreatic cancer, Esophageal cancer, Colon cancer, or Rectal cancer.  ROS:   All other ROS reviewed and negative. Pertinent positives noted in the HPI.     EKGs/Labs/Other Studies Reviewed:   The following studies were personally reviewed by me today:  EKG:  EKG is ordered today.    EKG  Interpretation Date/Time:  Thursday July 08 2023 15:40:59 EDT Ventricular Rate:  92 PR Interval:  166 QRS Duration:  90 QT Interval:  388 QTC Calculation: 479 R Axis:   0  Text Interpretation: Sinus rhythm with marked sinus arrhythmia Confirmed by Lennie Odor ([id number]) on 07/08/2023 3:43:02 PM   Recent Labs: 03/05/2023: ALT 14; TSH 1.49 07/05/2023: BUN 28; Creatinine, Ser 2.75; Hemoglobin 11.8; Platelets 198; Potassium 3.4; Sodium 136   Recent Lipid Panel    Component Value Date/Time   TRIG 159 (H) 03/17/2022 0754    Physical Exam:   VS:  BP 110/61 (BP Location: Right Arm, Patient Position: Sitting, Cuff Size: Large)   Pulse 92   Ht 5\' 10"  (1.778 m)   Wt 226 lb (102.5 kg)   SpO2 97%   BMI 32.43 kg/m    Wt Readings from Last 3 Encounters:  07/08/23 226 lb (102.5 kg)  06/24/23 234 lb (106.1 kg)  06/16/23 231 lb (104.8 kg)    General: Well nourished, well developed, in no acute distress Head: Atraumatic, normal size  Eyes: PEERLA, EOMI  Neck: Supple, no JVD Endocrine: No thryomegaly Cardiac: Normal S1, S2; RRR; no murmurs, rubs, or gallops Lungs: Clear to auscultation bilaterally, no wheezing, rhonchi or rales  Abd: Soft, nontender, no hepatomegaly  Ext: No edema, pulses 2+ Musculoskeletal: No deformities, BUE and BLE strength normal and equal Skin: Warm and dry, no rashes   Neuro: Alert and oriented to person, place, time, and situation, CNII-XII grossly intact, no focal deficits  Psych: Normal mood and affect   ASSESSMENT:   Jeffrey Ayala is a 62 y.o. male who presents for the following: 1. Persistent atrial fibrillation ([name])   2. Acquired thrombophilia ([name])     PLAN:   1. Persistent atrial fibrillation ([name]) 2. Acquired thrombophilia ([name]) -Status post cardioversion last year.  Maintaining sinus rhythm.  Reports some dizziness with standing.  We will stop his metoprolol.  CHA2DS2-VASc equals 2 (coronary calcium, hypertension).  Would recommend continue  Eliquis for now.  He will see me back in a year.      Disposition: Return in about 1 year (around 07/07/2024).  Medication Adjustments/Labs and Tests Ordered: Current medicines are reviewed at length with the patient today.  Concerns regarding  medicines are outlined above.  Orders Placed This Encounter  Procedures   EKG 12-Lead   No orders of the defined types were placed in this encounter.  Patient Instructions  Medication Instructions:  Stop Metoprolol Continue all other medications *If you need a refill on your cardiac medications before your next appointment, please call your pharmacy*   Lab Work: None ordered   Testing/Procedures: None ordered   Follow-Up: At [facility], you and your health needs are our priority.  As part of our continuing mission to provide you with exceptional heart care, we have created designated Provider Care Teams.  These Care Teams include your primary Cardiologist (physician) and Advanced Practice Providers (APPs -  Physician Assistants and Nurse Practitioners) who all work together to provide you with the care you need, when you need it.  We recommend signing up for the patient portal called "MyChart".  Sign up information is provided on this After Visit Summary.  MyChart is used to connect with patients for Virtual Visits (Telemedicine).  Patients are able to view lab/test results, encounter notes, upcoming appointments, etc.  Non-urgent messages can be sent to your provider as well.   To learn more about what you can do with MyChart, go to [email or web address].    Your next appointment:  1 year    Provider:  Dr.O'Neal     Time Spent with Patient: I have spent a total of 25 minutes with patient reviewing hospital notes, telemetry, EKGs, labs and examining the patient as well as establishing an assessment and plan that was discussed with the patient.  > 50% of time was spent in direct patient care.  Signed, Lenna Gilford. Flora Lipps,  MD, [facility]  [address] [place], [place] [address] [phone]  07/08/2023 3:56 PM

## 2023-07-08 ENCOUNTER — Encounter: Payer: Self-pay | Admitting: Cardiovascular Disease

## 2023-07-08 ENCOUNTER — Ambulatory Visit: Payer: BC Managed Care – PPO | Attending: Cardiovascular Disease | Admitting: Cardiovascular Disease

## 2023-07-08 VITALS — BP 110/61 | HR 92 | Ht 70.0 in | Wt 226.0 lb

## 2023-07-08 DIAGNOSIS — I4819 Other persistent atrial fibrillation: Secondary | ICD-10-CM

## 2023-07-08 DIAGNOSIS — D6869 Other thrombophilia: Secondary | ICD-10-CM

## 2023-07-08 NOTE — Patient Instructions (Signed)
Medication Instructions:  Stop Metoprolol Continue all other medications *If you need a refill on your cardiac medications before your next appointment, please call your pharmacy*   Lab Work: None ordered   Testing/Procedures: None ordered   Follow-Up: At [facility], you and your health needs are our priority.  As part of our continuing mission to provide you with exceptional heart care, we have created designated Provider Care Teams.  These Care Teams include your primary Cardiologist (physician) and Advanced Practice Providers (APPs -  Physician Assistants and Nurse Practitioners) who all work together to provide you with the care you need, when you need it.  We recommend signing up for the patient portal called "MyChart".  Sign up information is provided on this After Visit Summary.  MyChart is used to connect with patients for Virtual Visits (Telemedicine).  Patients are able to view lab/test results, encounter notes, upcoming appointments, etc.  Non-urgent messages can be sent to your provider as well.   To learn more about what you can do with MyChart, go to [email or web address].    Your next appointment:  1 year    Provider:  Dr.O'Neal

## 2023-07-09 DIAGNOSIS — N492 Inflammatory disorders of scrotum: Secondary | ICD-10-CM | POA: Diagnosis not present

## 2023-07-19 ENCOUNTER — Encounter (HOSPITAL_COMMUNITY): Payer: BC Managed Care – PPO

## 2023-07-21 ENCOUNTER — Other Ambulatory Visit: Payer: Self-pay | Admitting: Orthopedic Surgery

## 2023-08-02 ENCOUNTER — Ambulatory Visit (HOSPITAL_COMMUNITY)
Admission: RE | Admit: 2023-08-02 | Discharge: 2023-08-02 | Disposition: A | Discharge status: Home / Self Care | Payer: BC Managed Care – PPO | Source: Ambulatory Visit | Attending: Nephrology | Admitting: Nephrology

## 2023-08-02 VITALS — BP 159/77 | HR 80 | Temp 97.0°F | Resp 18

## 2023-08-02 DIAGNOSIS — N185 Chronic kidney disease, stage 5: Secondary | ICD-10-CM | POA: Insufficient documentation

## 2023-08-02 DIAGNOSIS — N186 End stage renal disease: Secondary | ICD-10-CM | POA: Diagnosis not present

## 2023-08-02 DIAGNOSIS — D509 Iron deficiency anemia, unspecified: Secondary | ICD-10-CM | POA: Diagnosis not present

## 2023-08-02 LAB — RENAL FUNCTION PANEL
Albumin: 4.1 g/dL (ref 3.5–5.0)
Anion gap: 16 — ABNORMAL HIGH (ref 5–15)
BUN: 20 mg/dL (ref 8–23)
CO2: 23 mmol/L (ref 22–32)
Calcium: 9.3 mg/dL (ref 8.9–10.3)
Chloride: 99 mmol/L (ref 98–111)
Creatinine, Ser: 2.71 mg/dL — ABNORMAL HIGH (ref 0.61–1.24)
GFR, Estimated: 26 mL/min — ABNORMAL LOW (ref >60)
Glucose, Bld: 143 mg/dL — ABNORMAL HIGH (ref 70–99)
Phosphorus: 3.2 mg/dL (ref 2.5–4.6)
Potassium: 3.9 mmol/L (ref 3.5–5.1)
Sodium: 138 mmol/L (ref 135–145)

## 2023-08-02 LAB — CBC WITH DIFFERENTIAL/PLATELET
Abs Immature Granulocytes: 0.05 10*3/uL (ref 0.00–0.07)
Basophils Absolute: 0 10*3/uL (ref 0.0–0.1)
Basophils Relative: 1 %
Eosinophils Absolute: 0 10*3/uL (ref 0.0–0.5)
Eosinophils Relative: 0 %
HCT: 38.1 % — ABNORMAL LOW (ref 39.0–52.0)
Hemoglobin: 12.3 g/dL — ABNORMAL LOW (ref 13.0–17.0)
Immature Granulocytes: 1 %
Lymphocytes Relative: 17 %
Lymphs Abs: 1.1 10*3/uL (ref 0.7–4.0)
MCH: 30.6 pg (ref 26.0–34.0)
MCHC: 32.3 g/dL (ref 30.0–36.0)
MCV: 94.8 fL (ref 80.0–100.0)
Monocytes Absolute: 0.3 10*3/uL (ref 0.1–1.0)
Monocytes Relative: 4 %
Neutro Abs: 5.1 10*3/uL (ref 1.7–7.7)
Neutrophils Relative %: 77 %
Platelets: 192 10*3/uL (ref 150–400)
RBC: 4.02 MIL/uL — ABNORMAL LOW (ref 4.22–5.81)
RDW: 16.3 % — ABNORMAL HIGH (ref 11.5–15.5)
WBC: 6.6 10*3/uL (ref 4.0–10.5)
nRBC: 0 % (ref 0.0–0.2)

## 2023-08-02 LAB — POCT HEMOGLOBIN-HEMACUE: Hemoglobin: 12.3 g/dL — ABNORMAL LOW (ref 13.0–17.0)

## 2023-08-02 MED ORDER — EPOETIN ALFA-EPBX 10000 UNIT/ML IJ SOLN
INTRAMUSCULAR | Status: AC
  Filled 2023-08-02: qty 2

## 2023-08-02 MED ORDER — EPOETIN ALFA-EPBX 10000 UNIT/ML IJ SOLN: 20000.0000 [IU] | INTRAMUSCULAR | Status: DC

## 2023-08-16 ENCOUNTER — Ambulatory Visit (HOSPITAL_COMMUNITY)
Admission: RE | Admit: 2023-08-16 | Discharge: 2023-08-16 | Disposition: A | Discharge status: Home / Self Care | Payer: BC Managed Care – PPO | Source: Ambulatory Visit | Attending: Nephrology | Admitting: Nephrology

## 2023-08-16 VITALS — BP 161/85 | HR 90 | Temp 97.4°F | Resp 18

## 2023-08-16 DIAGNOSIS — D509 Iron deficiency anemia, unspecified: Secondary | ICD-10-CM | POA: Diagnosis not present

## 2023-08-16 DIAGNOSIS — N186 End stage renal disease: Secondary | ICD-10-CM | POA: Insufficient documentation

## 2023-08-16 DIAGNOSIS — N185 Chronic kidney disease, stage 5: Secondary | ICD-10-CM | POA: Insufficient documentation

## 2023-08-16 LAB — POCT HEMOGLOBIN-HEMACUE: Hemoglobin: 14.3 g/dL (ref 13.0–17.0)

## 2023-08-16 MED ORDER — EPOETIN ALFA-EPBX 10000 UNIT/ML IJ SOLN: 20000.0000 [IU] | INTRAMUSCULAR | Status: DC

## 2023-08-24 ENCOUNTER — Telehealth: Payer: Self-pay

## 2023-08-24 NOTE — Telephone Encounter (Signed)
-----   Message from [facility] Marylu Lund H sent at 06/24/2023 10:27 AM EDT ----- Regarding: due for labs (LFTs) Due for labs in October - can use MyChart

## 2023-08-24 NOTE — Telephone Encounter (Signed)
MyChart message to patient to go to the lab for LFTs.

## 2023-08-30 ENCOUNTER — Ambulatory Visit (HOSPITAL_COMMUNITY)
Admission: RE | Admit: 2023-08-30 | Discharge: 2023-08-30 | Disposition: A | Discharge status: Home / Self Care | Payer: BC Managed Care – PPO | Source: Ambulatory Visit | Attending: Nephrology | Admitting: Nephrology

## 2023-08-30 VITALS — BP 144/69 | HR 91 | Temp 97.8°F | Resp 17

## 2023-08-30 DIAGNOSIS — D509 Iron deficiency anemia, unspecified: Secondary | ICD-10-CM | POA: Diagnosis not present

## 2023-08-30 DIAGNOSIS — N185 Chronic kidney disease, stage 5: Secondary | ICD-10-CM | POA: Insufficient documentation

## 2023-08-30 DIAGNOSIS — N186 End stage renal disease: Secondary | ICD-10-CM | POA: Insufficient documentation

## 2023-08-30 LAB — CBC WITH DIFFERENTIAL/PLATELET
Abs Immature Granulocytes: 0.05 10*3/uL (ref 0.00–0.07)
Basophils Absolute: 0 10*3/uL (ref 0.0–0.1)
Basophils Relative: 1 %
Eosinophils Absolute: 0 10*3/uL (ref 0.0–0.5)
Eosinophils Relative: 0 %
HCT: 37.2 % — ABNORMAL LOW (ref 39.0–52.0)
Hemoglobin: 11.9 g/dL — ABNORMAL LOW (ref 13.0–17.0)
Immature Granulocytes: 1 %
Lymphocytes Relative: 16 %
Lymphs Abs: 1 10*3/uL (ref 0.7–4.0)
MCH: 30.2 pg (ref 26.0–34.0)
MCHC: 32 g/dL (ref 30.0–36.0)
MCV: 94.4 fL (ref 80.0–100.0)
Monocytes Absolute: 0.3 10*3/uL (ref 0.1–1.0)
Monocytes Relative: 5 %
Neutro Abs: 4.6 10*3/uL (ref 1.7–7.7)
Neutrophils Relative %: 77 %
Platelets: 175 10*3/uL (ref 150–400)
RBC: 3.94 MIL/uL — ABNORMAL LOW (ref 4.22–5.81)
RDW: 16.2 % — ABNORMAL HIGH (ref 11.5–15.5)
WBC: 6 10*3/uL (ref 4.0–10.5)
nRBC: 0 % (ref 0.0–0.2)

## 2023-08-30 LAB — RENAL FUNCTION PANEL
Albumin: 3.9 g/dL (ref 3.5–5.0)
Anion gap: 11 (ref 5–15)
BUN: 22 mg/dL (ref 8–23)
CO2: 23 mmol/L (ref 22–32)
Calcium: 8.7 mg/dL — ABNORMAL LOW (ref 8.9–10.3)
Chloride: 101 mmol/L (ref 98–111)
Creatinine, Ser: 2.84 mg/dL — ABNORMAL HIGH (ref 0.61–1.24)
GFR, Estimated: 24 mL/min — ABNORMAL LOW (ref >60)
Glucose, Bld: 158 mg/dL — ABNORMAL HIGH (ref 70–99)
Phosphorus: 2.8 mg/dL (ref 2.5–4.6)
Potassium: 3.4 mmol/L — ABNORMAL LOW (ref 3.5–5.1)
Sodium: 135 mmol/L (ref 135–145)

## 2023-08-30 LAB — IRON AND TIBC
Iron: 79 ug/dL (ref 45–182)
Saturation Ratios: 32 % (ref 17.9–39.5)
TIBC: 246 ug/dL — ABNORMAL LOW (ref 250–450)
UIBC: 167 ug/dL

## 2023-08-30 LAB — POCT HEMOGLOBIN-HEMACUE: Hemoglobin: 12.1 g/dL — ABNORMAL LOW (ref 13.0–17.0)

## 2023-08-30 LAB — FERRITIN: Ferritin: 472 ng/mL — ABNORMAL HIGH (ref 24–336)

## 2023-08-30 MED ORDER — EPOETIN ALFA-EPBX 10000 UNIT/ML IJ SOLN: 20000.0000 [IU] | INTRAMUSCULAR | Status: DC

## 2023-09-02 ENCOUNTER — Encounter: Payer: Self-pay | Admitting: Family Medicine

## 2023-09-02 ENCOUNTER — Ambulatory Visit: Payer: BC Managed Care – PPO | Admitting: Family Medicine

## 2023-09-02 ENCOUNTER — Other Ambulatory Visit (INDEPENDENT_AMBULATORY_CARE_PROVIDER_SITE_OTHER): Payer: BC Managed Care – PPO

## 2023-09-02 VITALS — BP 142/76 | HR 85 | Temp 97.6°F | Ht 70.0 in | Wt 240.0 lb

## 2023-09-02 DIAGNOSIS — I10 Essential (primary) hypertension: Secondary | ICD-10-CM | POA: Diagnosis not present

## 2023-09-02 DIAGNOSIS — Z23 Encounter for immunization: Secondary | ICD-10-CM

## 2023-09-02 DIAGNOSIS — E039 Hypothyroidism, unspecified: Secondary | ICD-10-CM

## 2023-09-02 DIAGNOSIS — F341 Dysthymic disorder: Secondary | ICD-10-CM

## 2023-09-02 DIAGNOSIS — M25511 Pain in right shoulder: Secondary | ICD-10-CM

## 2023-09-02 DIAGNOSIS — Z7185 Encounter for immunization safety counseling: Secondary | ICD-10-CM

## 2023-09-02 DIAGNOSIS — M109 Gout, unspecified: Secondary | ICD-10-CM

## 2023-09-02 DIAGNOSIS — E785 Hyperlipidemia, unspecified: Secondary | ICD-10-CM

## 2023-09-02 DIAGNOSIS — Z7901 Long term (current) use of anticoagulants: Secondary | ICD-10-CM

## 2023-09-02 DIAGNOSIS — G8929 Other chronic pain: Secondary | ICD-10-CM

## 2023-09-02 LAB — LIPID PANEL
Cholesterol: 158 mg/dL (ref 0–200)
HDL: 27.7 mg/dL — ABNORMAL LOW (ref >39.00)
LDL Cholesterol: 72 mg/dL (ref 0–99)
NonHDL: 130.43
Total CHOL/HDL Ratio: 6
Triglycerides: 293 mg/dL — ABNORMAL HIGH (ref 0.0–149.0)
VLDL: 58.6 mg/dL — ABNORMAL HIGH (ref 0.0–40.0)

## 2023-09-02 LAB — URIC ACID: Uric Acid, Serum: 7.7 mg/dL (ref 4.0–7.8)

## 2023-09-02 LAB — TSH: TSH: 2.29 u[IU]/mL (ref 0.35–5.50)

## 2023-09-02 MED ORDER — ALLOPURINOL 100 MG PO TABS
200.0000 mg | ORAL_TABLET | Freq: Every day | ORAL | 2 refills | Status: DC
Start: 2023-09-02 — End: 2023-10-04

## 2023-09-02 NOTE — Patient Instructions (Addendum)
Call and schedule appointment with [facility]   Check with your insurance regarding the Shingrix vaccines (2 shots).   You are eligible for the RSV vaccine.   I also recommend getting the Covid booster.

## 2023-09-02 NOTE — Assessment & Plan Note (Signed)
No recent gout flares. Check uric acid and adjust dose as recommended.

## 2023-09-02 NOTE — Assessment & Plan Note (Signed)
Check TSH. He is not on medication

## 2023-09-02 NOTE — Progress Notes (Signed)
Subjective:     Patient ID: Jeffrey Ayala, male    DOB: January 16, 1961, 62 y.o.   MRN: [id number]  Chief Complaint  Patient presents with   Medical Management of Chronic Issues    6 month f/u    HPI  History of Present Illness         Here to follow up on chronic health conditions.  He is doing better. Here alone today. Working and driving. Able to care for himself.   His mood has improved.   He is followed by several specialists.   Gout- no recent gout flares. Taking allopurinol 200 mg daily.   No lipid panel in years.   Hypothyroidism- no medication.    Right shoulder pain. Has Tramadol at home. He has seen [facility] in the past. Will follow up with [facility].         Health Maintenance Due  Topic Date Due   Zoster Vaccines- Shingrix (1 of 2) Never done    Past Medical History:  Diagnosis Date   A-fib ([name])    Acute nonintractable headache 03/25/2022   AKI (acute kidney injury) ([name])    Anemia    associated with chronic renal disease   Arthritis    Atrial fibrillation, chronic ([name])    not on [facility]   Cardiac arrest ([name]) 02/2022   in the setting of septic shock   Cirrhosis of liver ([name])    Concussion    Dysrhythmia    afib   Eosinophilic esophagitis    EGD/bx by Dr. 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   Type 2 diabetes mellitus with stage 4 chronic kidney disease ([name])     Past Surgical History:  Procedure Laterality Date   AMPUTATION Right 03/15/2022   Procedure: RIGHT BELOW KNEE AMPUTATION;  Surgeon: Nadara Mustard, MD;  Location: [facility] OR;  Service: Orthopedics;  Laterality: Right;   AV FISTULA PLACEMENT Left 08/28/2022   Procedure: LEFT ARM ARTERIOVENOUS (AV) FISTULA  CREATION;  Surgeon: Maeola Harman, MD;  Location[facility] OR;  Service: Vascular;  Laterality: Left;   BIOPSY  10/26/2022   Procedure: BIOPSY;  Surgeon: Benancio Deeds, MD;  [facility];  Service: Gastroenterology;;   CARDIOVERSION N/A 11/20/2022   Procedure: CARDIOVERSION;  Surgeon: Meriam Sprague, MD;  Location: [facility] ENDOSCOPY;  Service: Cardiovascular;  Laterality: N/A;   COLONOSCOPY WITH PROPOFOL N/A 10/26/2022   Procedure: COLONOSCOPY WITH PROPOFOL;  Surgeon: Benancio Deeds, MD;  [facility]: [facility];  Service: Gastroenterology;  Laterality: N/A;   CYSTOSCOPY     CYSTOSCOPY WITH STENT PLACEMENT Bilateral 08/15/2022   Procedure: CYSTOSCOPY WITH STENT PLACEMENT;  Surgeon: Belva Agee, MD;  Location: [facility] OR;  Service: Urology;  Laterality: Bilateral;   CYSTOSCOPY/URETEROSCOPY/HOLMIUM LASER/STENT PLACEMENT Bilateral 09/14/2022   Procedure: CYSTOSCOPY/URETEROSCOPY/HOLMIUM LASER/STENT PLACEMENT;  Surgeon: Heloise Purpura, MD;  Location: [facility] ORS;  Service: Urology;  Laterality: Bilateral;   DENTAL SURGERY     ESOPHAGOGASTRODUODENOSCOPY (EGD) WITH PROPOFOL N/A 10/26/2022   Procedure: ESOPHAGOGASTRODUODENOSCOPY (EGD) WITH PROPOFOL;  Surgeon: Benancio Deeds, MD;  [facility];  Service: Gastroenterology;  Laterality: N/A;   FINGER SURGERY     HERNIA  REPAIR     at age 74   IR FLUORO GUIDE CV LINE RIGHT  03/09/2022   IR PATIENT EVAL TECH 0-60 MINS  03/09/2022   IR US GUIDE VASC ACCESS RIGHT  03/09/2022   KNEE ARTHROSCOPY Left    LITHOTRIPSY     NEPHROLITHOTOMY     POLYPECTOMY  10/26/2022   Procedure: POLYPECTOMY;  Surgeon: Benancio Deeds, MD;  Location: [facility];  Service: Gastroenterology;;   Karen Kays EXPLORATION N/A 03/16/2022   Procedure: IRRIGATION AND DEBRIDEMENT SCROTUM;  Surgeon: Heloise Purpura, MD;  Location: [facility] OR;  Service: Urology;  Laterality: N/A;   SCROTAL EXPLORATION N/A 03/28/2022   Procedure: IRRIGATION AND DEBRIDEMENT  SCROTUM;  Surgeon: Crist Fat, MD;  Location: [facility] OR;  Service: Urology;  Laterality: N/A;   TONSILLECTOMY     TOTAL KNEE ARTHROPLASTY Bilateral 11/04/2018   Procedure: BILATERAL TOTAL KNEE ARTHROPLASTY;  Surgeon: Nadara Mustard, MD;  Location[facility] OR;  Service: Orthopedics;  Laterality: Bilateral;  spinal/epidural per anesthesiologist   TRANSMETATARSAL AMPUTATION Left 03/15/2022   Procedure: LEFT TRANSMETATARSAL AMPUTATION;  Surgeon: Nadara Mustard, MD;  Location: [facility] OR;  Service: Orthopedics;  Laterality: Left;    Family History  Problem Relation Age of Onset   Arrhythmia Mother    COPD Mother    Diabetes Mother    Kidney disease Mother    Gout Father    Kidney disease Father    Sarcoidosis Brother    Cancer Brother    Stomach cancer Neg Hx    Pancreatic cancer Neg Hx    Esophageal cancer Neg Hx    Colon cancer Neg Hx    Rectal cancer Neg Hx     Social History   Socioeconomic History   Marital status: Married    Spouse name: Olegario Messier   Number of children: 2   Years of education: Not on file   Highest education level: Not on file  Occupational History   Occupation: Airline pilot - Chief of Staff  Tobacco Use   Smoking status: Never   Smokeless tobacco: Never  Vaping Use   Vaping status: Never Used  Substance and Sexual Activity   Alcohol use: Not Currently   Drug use: No   Sexual activity: Not Currently  Other Topics Concern   Not on file  Social History Narrative   Not on file   Social Determinants of Health   Financial Resource Strain: Not on file  Food Insecurity: Low Risk  (05/18/2023)   [facility]   Hunger Vital Sign    Worried About Running Out of Food in the Last Year: Never true    Ran Out of Food in the Last Year: Never true  Transportation Needs: Not on file (05/18/2023)  Physical Activity: Not on file  Stress: Not on file  Social Connections: Not on file  Intimate Partner Violence: Not on file    Outpatient  Medications Prior to Visit  Medication Sig Dispense Refill   acetaminophen (TYLENOL) 500 MG tablet Take 1,000 mg by mouth every 6 (six) hours as needed for moderate pain or headache. Rapid Release     apixaban (ELIQUIS) 5 MG TABS tablet Take 1 tablet (5 mg total) by mouth 2 (two) times daily. 180 tablet 3   diphenhydramine-acetaminophen (TYLENOL PM) 25-500 MG TABS tablet Take 2 tablets by mouth at bedtime as needed (sleep).     furosemide (LASIX) 40 MG tablet Take 40 mg by mouth daily.     gabapentin (NEURONTIN)  100 MG capsule TAKE 1 CAPSULE (100 MG TOTAL) BY MOUTH THREE TIMES DAILY. 90 capsule 3   mupirocin ointment (BACTROBAN) 2 % APPLY TO AFFECTED AREA TWICE A DAY 22 g 0   pantoprazole (PROTONIX) 40 MG tablet TAKE 1 TABLET BY MOUTH TWICE A DAY 180 tablet 0   Potassium Chloride ER 20 MEQ TBCR Take 1 tablet by mouth daily.     predniSONE (DELTASONE) 10 MG tablet TAKE 1 TABLET (10 MG TOTAL) BY MOUTH DAILY WITH BREAKFAST. 30 tablet 3   traZODone (DESYREL) 50 MG tablet TAKE 1/2 OR 1 TABLET BY MOUTH AT BEDTIME AS NEEDED FOR SLEEP 90 tablet 2   triamcinolone (KENALOG) 0.025 % ointment APPLY TO AFFECTED AREA TWICE A DAY 30 g 0   allopurinol (ZYLOPRIM) 100 MG tablet TAKE 1 TABLET BY MOUTH EVERY DAY 30 tablet 2   No facility-administered medications prior to visit.    Allergies  Allergen Reactions   Codeine Hives    Insomnia, jittery   Flomax [Tamsulosin Hcl] Nausea Only    Nausea and fatigue    Review of Systems  Constitutional:  Negative for chills and fever.  Respiratory:  Negative for shortness of breath.   Cardiovascular:  Negative for chest pain, palpitations and leg swelling.  Gastrointestinal:  Negative for abdominal pain, constipation, diarrhea, nausea and vomiting.  Genitourinary:  Negative for dysuria, frequency and urgency.  Musculoskeletal:  Positive for joint pain.  Neurological:  Negative for dizziness.       Objective:    Physical Exam Constitutional:      General:  He is not in acute distress.    Appearance: He is not ill-appearing.  Eyes:     Extraocular Movements: Extraocular movements intact.     Conjunctiva/sclera: Conjunctivae normal.  Cardiovascular:     Rate and Rhythm: Normal rate.  Pulmonary:     Effort: Pulmonary effort is normal.  Musculoskeletal:     Cervical back: Normal range of motion and neck supple.  Skin:    General: Skin is warm and dry.  Neurological:     Mental Status: He is alert and oriented to person, place, and time.  Psychiatric:        Mood and Affect: Mood normal.        Behavior: Behavior normal.        Thought Content: Thought content normal.      BP (!) 142/76 (BP Location: Left Arm, Patient Position: Sitting, Cuff Size: Large)   Pulse 85   Temp 97.6 F (36.4 C) (Temporal)   Ht 5\' 10"  (1.778 m)   Wt 240 lb (108.9 kg)   SpO2 97%   BMI 34.44 kg/m  Wt Readings from Last 3 Encounters:  09/02/23 240 lb (108.9 kg)  07/08/23 226 lb (102.5 kg)  06/24/23 234 lb (106.1 kg)       Assessment & Plan:   Problem List Items Addressed This Visit       Cardiovascular and Mediastinum   Essential hypertension    Managed by cardiologist. Also sees nephrologist.         Endocrine   Hypothyroidism, unspecified    Check TSH. He is not on medication      Relevant Orders   TSH (Completed)     Other   Chronic right shoulder pain    Follow up with [facility].       Dyslipidemia    Check lipids and follow up      Relevant Orders   Lipid panel (  Completed)   Dysthymic disorder    Mood has improved. He is independent again.       Gout - Primary    No recent gout flares. Check uric acid and adjust dose as recommended.       Relevant Medications   allopurinol (ZYLOPRIM) 100 MG tablet   Other Relevant Orders   Uric acid (Completed)   Long term (current) use of anticoagulants    Followed by cardiology       Other Visit Diagnoses     Need for influenza vaccination       Relevant Orders   Flu  vaccine trivalent PF, 6mos and older(Flulaval,Afluria,Fluarix,Fluzone) (Completed)   Immunization counseling           I have changed Lamin K. Jarriel "Chuck"'s allopurinol. I am also having him maintain his diphenhydramine-acetaminophen, furosemide, acetaminophen, apixaban, Potassium Chloride ER, traZODone, gabapentin, pantoprazole, triamcinolone, mupirocin ointment, and predniSONE.  Meds ordered this encounter  Medications   allopurinol (ZYLOPRIM) 100 MG tablet    Sig: Take 2 tablets (200 mg total) by mouth daily.    Dispense:  60 tablet    Refill:  2    Order Specific Question:   Supervising Provider    Answer:   Hillard Danker A [[id number]]

## 2023-09-02 NOTE — Assessment & Plan Note (Signed)
Managed by cardiologist. Also sees nephrologist.

## 2023-09-02 NOTE — Assessment & Plan Note (Signed)
Follow up with [facility].

## 2023-09-02 NOTE — Assessment & Plan Note (Signed)
Mood has improved. He is independent again.

## 2023-09-02 NOTE — Assessment & Plan Note (Signed)
Followed by cardiology. 

## 2023-09-02 NOTE — Assessment & Plan Note (Signed)
Check lipids and follow up.

## 2023-09-03 ENCOUNTER — Ambulatory Visit: Payer: BC Managed Care – PPO | Admitting: Family Medicine

## 2023-09-08 ENCOUNTER — Other Ambulatory Visit: Payer: Self-pay | Admitting: Gastroenterology

## 2023-09-10 DIAGNOSIS — N179 Acute kidney failure, unspecified: Secondary | ICD-10-CM | POA: Diagnosis not present

## 2023-09-10 DIAGNOSIS — I129 Hypertensive chronic kidney disease with stage 1 through stage 4 chronic kidney disease, or unspecified chronic kidney disease: Secondary | ICD-10-CM | POA: Diagnosis not present

## 2023-09-10 DIAGNOSIS — E1122 Type 2 diabetes mellitus with diabetic chronic kidney disease: Secondary | ICD-10-CM | POA: Diagnosis not present

## 2023-09-10 DIAGNOSIS — N184 Chronic kidney disease, stage 4 (severe): Secondary | ICD-10-CM | POA: Diagnosis not present

## 2023-09-13 ENCOUNTER — Ambulatory Visit (HOSPITAL_COMMUNITY)
Admission: RE | Admit: 2023-09-13 | Discharge: 2023-09-13 | Disposition: A | Discharge status: Home / Self Care | Payer: BC Managed Care – PPO | Source: Ambulatory Visit | Attending: Nephrology | Admitting: Nephrology

## 2023-09-13 ENCOUNTER — Encounter (HOSPITAL_COMMUNITY): Payer: BC Managed Care – PPO

## 2023-09-13 VITALS — BP 158/85 | HR 88 | Temp 97.8°F | Resp 18

## 2023-09-13 DIAGNOSIS — D509 Iron deficiency anemia, unspecified: Secondary | ICD-10-CM | POA: Insufficient documentation

## 2023-09-13 DIAGNOSIS — N186 End stage renal disease: Secondary | ICD-10-CM | POA: Diagnosis not present

## 2023-09-13 DIAGNOSIS — N185 Chronic kidney disease, stage 5: Secondary | ICD-10-CM | POA: Insufficient documentation

## 2023-09-13 LAB — POCT HEMOGLOBIN-HEMACUE: Hemoglobin: 12 g/dL — ABNORMAL LOW (ref 13.0–17.0)

## 2023-09-13 MED ORDER — EPOETIN ALFA-EPBX 10000 UNIT/ML IJ SOLN: 20000.0000 [IU] | INTRAMUSCULAR | Status: DC

## 2023-09-27 ENCOUNTER — Ambulatory Visit (HOSPITAL_COMMUNITY)
Admission: RE | Admit: 2023-09-27 | Discharge: 2023-09-27 | Disposition: A | Discharge status: Home / Self Care | Payer: BC Managed Care – PPO | Source: Ambulatory Visit | Attending: Nephrology | Admitting: Nephrology

## 2023-09-27 ENCOUNTER — Encounter (HOSPITAL_COMMUNITY): Payer: BC Managed Care – PPO

## 2023-09-27 VITALS — BP 144/69 | HR 81 | Temp 97.0°F | Resp 17

## 2023-09-27 DIAGNOSIS — D509 Iron deficiency anemia, unspecified: Secondary | ICD-10-CM | POA: Insufficient documentation

## 2023-09-27 DIAGNOSIS — N185 Chronic kidney disease, stage 5: Secondary | ICD-10-CM | POA: Insufficient documentation

## 2023-09-27 DIAGNOSIS — N186 End stage renal disease: Secondary | ICD-10-CM | POA: Diagnosis not present

## 2023-09-27 LAB — CBC WITH DIFFERENTIAL/PLATELET
Abs Immature Granulocytes: 0.05 10*3/uL (ref 0.00–0.07)
Basophils Absolute: 0 10*3/uL (ref 0.0–0.1)
Basophils Relative: 0 %
Eosinophils Absolute: 0.2 10*3/uL (ref 0.0–0.5)
Eosinophils Relative: 3 %
HCT: 36.7 % — ABNORMAL LOW (ref 39.0–52.0)
Hemoglobin: 12.1 g/dL — ABNORMAL LOW (ref 13.0–17.0)
Immature Granulocytes: 1 %
Lymphocytes Relative: 15 %
Lymphs Abs: 1 10*3/uL (ref 0.7–4.0)
MCH: 30.6 pg (ref 26.0–34.0)
MCHC: 33 g/dL (ref 30.0–36.0)
MCV: 92.7 fL (ref 80.0–100.0)
Monocytes Absolute: 0.4 10*3/uL (ref 0.1–1.0)
Monocytes Relative: 6 %
Neutro Abs: 5.1 10*3/uL (ref 1.7–7.7)
Neutrophils Relative %: 75 %
Platelets: 176 10*3/uL (ref 150–400)
RBC: 3.96 MIL/uL — ABNORMAL LOW (ref 4.22–5.81)
RDW: 15.9 % — ABNORMAL HIGH (ref 11.5–15.5)
WBC: 6.8 10*3/uL (ref 4.0–10.5)
nRBC: 0 % (ref 0.0–0.2)

## 2023-09-27 LAB — RENAL FUNCTION PANEL
Albumin: 3.9 g/dL (ref 3.5–5.0)
Anion gap: 13 (ref 5–15)
BUN: 21 mg/dL (ref 8–23)
CO2: 22 mmol/L (ref 22–32)
Calcium: 9 mg/dL (ref 8.9–10.3)
Chloride: 103 mmol/L (ref 98–111)
Creatinine, Ser: 2.66 mg/dL — ABNORMAL HIGH (ref 0.61–1.24)
GFR, Estimated: 26 mL/min — ABNORMAL LOW (ref >60)
Glucose, Bld: 151 mg/dL — ABNORMAL HIGH (ref 70–99)
Phosphorus: 3.1 mg/dL (ref 2.5–4.6)
Potassium: 3.5 mmol/L (ref 3.5–5.1)
Sodium: 138 mmol/L (ref 135–145)

## 2023-09-27 LAB — POCT HEMOGLOBIN-HEMACUE: Hemoglobin: 13 g/dL (ref 13.0–17.0)

## 2023-09-27 MED ORDER — EPOETIN ALFA-EPBX 10000 UNIT/ML IJ SOLN: 20000.0000 [IU] | INTRAMUSCULAR | Status: DC

## 2023-10-04 ENCOUNTER — Other Ambulatory Visit: Payer: Self-pay | Admitting: Family Medicine

## 2023-10-04 DIAGNOSIS — M109 Gout, unspecified: Secondary | ICD-10-CM

## 2023-10-08 ENCOUNTER — Other Ambulatory Visit: Payer: Self-pay | Admitting: Family Medicine

## 2023-10-08 DIAGNOSIS — L282 Other prurigo: Secondary | ICD-10-CM

## 2023-10-11 ENCOUNTER — Encounter (HOSPITAL_COMMUNITY): Payer: BC Managed Care – PPO

## 2023-10-11 ENCOUNTER — Other Ambulatory Visit: Payer: Self-pay | Admitting: Family Medicine

## 2023-10-11 ENCOUNTER — Other Ambulatory Visit: Payer: Self-pay | Admitting: Physical Medicine & Rehabilitation

## 2023-10-11 ENCOUNTER — Inpatient Hospital Stay (HOSPITAL_COMMUNITY): Admission: RE | Admit: 2023-10-11 | Payer: BC Managed Care – PPO | Source: Ambulatory Visit

## 2023-10-11 ENCOUNTER — Encounter (HOSPITAL_COMMUNITY): Payer: Self-pay

## 2023-10-11 ENCOUNTER — Other Ambulatory Visit: Payer: Self-pay | Admitting: Family

## 2023-10-11 ENCOUNTER — Other Ambulatory Visit: Payer: Self-pay | Admitting: Cardiovascular Disease

## 2023-10-11 DIAGNOSIS — L282 Other prurigo: Secondary | ICD-10-CM

## 2023-10-11 DIAGNOSIS — I4819 Other persistent atrial fibrillation: Secondary | ICD-10-CM

## 2023-10-11 MED ORDER — APIXABAN 5 MG PO TABS: 5.0000 mg | ORAL_TABLET | Freq: Two times a day (BID) | ORAL | 1 refills | Status: DC

## 2023-10-11 NOTE — Telephone Encounter (Signed)
*  STAT* If patient is at the pharmacy, call can be transferred to refill team.   1. Which medications need to be refilled? (please list name of each medication and dose if known) apixaban (ELIQUIS) 5 MG TABS tablet    2. Would you like to learn more about the convenience, safety, & potential cost savings by using the [facility]? No      3. Are you open to using the [facility] (Type [facility]. No ).   4. Which pharmacy/location (including street and city if local pharmacy) is medication to be sent to? [id number] [facility], [place] - [address]    5. Do they need a 30 day or 90 day supply? 90

## 2023-10-11 NOTE — Telephone Encounter (Signed)
Prescription refill request for Eliquis received. Indication: Afib  Last office visit: 07/08/23 ([place])  Scr: 2.66 (09/27/23)  Age: 62 Weight: 108.9kg  Appropriate dose. Refill sent.

## 2023-10-11 NOTE — Telephone Encounter (Signed)
Ok to refill bactroban?

## 2023-10-11 NOTE — Telephone Encounter (Signed)
RX routed to Coumadin

## 2023-10-15 ENCOUNTER — Telehealth: Payer: Self-pay | Admitting: Cardiovascular Disease

## 2023-10-15 DIAGNOSIS — I4819 Other persistent atrial fibrillation: Secondary | ICD-10-CM

## 2023-10-15 MED ORDER — APIXABAN 5 MG PO TABS: 5.0000 mg | ORAL_TABLET | Freq: Two times a day (BID) | ORAL | 5 refills | Status: DC

## 2023-10-15 NOTE — Telephone Encounter (Signed)
Pt last saw Dr Flora Lipps 07/08/23, last labs 09/27/23 Creat 2.66, age 62, weight 108.9kg, based on specified criteria pt is on appropriate dosage of Eliquis 5mg  BID for afib.  Will refill rx.

## 2023-10-15 NOTE — Telephone Encounter (Signed)
*  STAT* If patient is at the pharmacy, call can be transferred to refill team.   1. Which medications need to be refilled? (please list name of each medication and dose if known) apixaban (ELIQUIS) 5 MG TABS tablet    2. Would you like to learn more about the convenience, safety, & potential cost savings by using the [facility]? No     3. Are you open to using the [facility] (Type [facility]. No ).   4. Which pharmacy/location (including street and city if local pharmacy) is medication to be sent to? [id number] [place], [place] - [address]    5. Do they need a 30 day or 90 day supply? 30

## 2023-10-25 ENCOUNTER — Encounter (HOSPITAL_COMMUNITY): Payer: BC Managed Care – PPO

## 2023-11-08 ENCOUNTER — Other Ambulatory Visit: Payer: Self-pay | Admitting: Nurse Practitioner

## 2023-11-08 ENCOUNTER — Encounter (HOSPITAL_COMMUNITY): Payer: BC Managed Care – PPO

## 2023-11-08 DIAGNOSIS — K7469 Other cirrhosis of liver: Secondary | ICD-10-CM

## 2023-11-08 DIAGNOSIS — K7581 Nonalcoholic steatohepatitis (NASH): Secondary | ICD-10-CM | POA: Diagnosis not present

## 2023-11-08 DIAGNOSIS — K7460 Unspecified cirrhosis of liver: Secondary | ICD-10-CM

## 2023-11-10 ENCOUNTER — Encounter: Payer: Self-pay | Admitting: Family Medicine

## 2023-11-10 ENCOUNTER — Ambulatory Visit (INDEPENDENT_AMBULATORY_CARE_PROVIDER_SITE_OTHER): Payer: BC Managed Care – PPO | Admitting: Family Medicine

## 2023-11-10 VITALS — BP 122/72 | HR 88 | Temp 97.6°F | Ht 70.0 in | Wt 239.0 lb

## 2023-11-10 DIAGNOSIS — Z7901 Long term (current) use of anticoagulants: Secondary | ICD-10-CM | POA: Diagnosis not present

## 2023-11-10 DIAGNOSIS — R040 Epistaxis: Secondary | ICD-10-CM

## 2023-11-10 NOTE — Patient Instructions (Signed)
Start using saline nasal spray or gel.   Have Afrin on hand in case of another bleed.   I have placed an urgent referral to ENT. They will call you to schedule.

## 2023-11-10 NOTE — Progress Notes (Signed)
Subjective:     Patient ID: Jeffrey Ayala, male    DOB: 1961/02/21, 62 y.o.   MRN: [id number]  Chief Complaint  Patient presents with   Epistaxis    Frequent nose bleeds, getting worse about 4-5 days a week. Always on right side     Epistaxis     History of Present Illness          Here with c/o 2 week hx of right sided epistaxis becoming more frequent. He has been able to stop the bleeding with pressure after only 3-5 minutes.   On Eliquis.     Health Maintenance Due  Topic Date Due   Zoster Vaccines- Shingrix (1 of 2) Never done    Past Medical History:  Diagnosis Date   A-fib ([name])    Acute nonintractable headache 03/25/2022   AKI (acute kidney injury) ([name])    Anemia    associated with chronic renal disease   Arthritis    Atrial fibrillation, chronic ([name])    not on [facility]   Cardiac arrest ([name]) 02/2022   in the setting of septic shock   Cirrhosis of liver ([name])    Concussion    Dysrhythmia    afib   Eosinophilic esophagitis    EGD/bx by Dr. 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   Type 2 diabetes mellitus with stage 4 chronic kidney disease ([name])     Past Surgical History:  Procedure Laterality Date   AMPUTATION Right 03/15/2022   Procedure: RIGHT BELOW KNEE AMPUTATION;  Surgeon: Nadara Mustard, MD;  Location: [facility] OR;  Service: Orthopedics;  Laterality: Right;   AV FISTULA PLACEMENT Left 08/28/2022   Procedure: LEFT ARM ARTERIOVENOUS (AV) FISTULA CREATION;  Surgeon: Maeola Harman, MD;  Location: [facility] OR;  Service: Vascular;  Laterality: Left;   BIOPSY  10/26/2022   Procedure: BIOPSY;  Surgeon: Benancio Deeds, MD;  [facility];  Service: Gastroenterology;;    CARDIOVERSION N/A 11/20/2022   Procedure: CARDIOVERSION;  Surgeon: Meriam Sprague, MD;  Location: [facility] ENDOSCOPY;  Service: Cardiovascular;  Laterality: N/A;   COLONOSCOPY WITH PROPOFOL N/A 10/26/2022   Procedure: COLONOSCOPY WITH PROPOFOL;  Surgeon: Benancio Deeds, MD;  [facility];  Service: Gastroenterology;  Laterality: N/A;   CYSTOSCOPY     CYSTOSCOPY WITH STENT PLACEMENT Bilateral 08/15/2022   Procedure: CYSTOSCOPY WITH STENT PLACEMENT;  Surgeon: Belva Agee, MD;  Location: [facility] OR;  Service: Urology;  Laterality: Bilateral;   CYSTOSCOPY/URETEROSCOPY/HOLMIUM LASER/STENT PLACEMENT Bilateral 09/14/2022   Procedure: CYSTOSCOPY/URETEROSCOPY/HOLMIUM LASER/STENT PLACEMENT;  Surgeon: Heloise Purpura, MD;  [facility];  Service: Urology;  Laterality: Bilateral;   DENTAL SURGERY     ESOPHAGOGASTRODUODENOSCOPY (EGD) WITH PROPOFOL N/A 10/26/2022   Procedure: ESOPHAGOGASTRODUODENOSCOPY (EGD) WITH PROPOFOL;  Surgeon: Benancio Deeds, MD;  [facility];  Service: Gastroenterology;  Laterality: N/A;   FINGER SURGERY     HERNIA REPAIR     at age 45   IR FLUORO GUIDE CV LINE RIGHT  03/09/2022   IR PATIENT EVAL TECH 0-60 MINS  03/09/2022   IR US GUIDE VASC ACCESS RIGHT  03/09/2022   KNEE ARTHROSCOPY Left  LITHOTRIPSY     NEPHROLITHOTOMY     POLYPECTOMY  10/26/2022   Procedure: POLYPECTOMY;  Surgeon: Benancio Deeds, MD;  Location: [facility];  Service: Gastroenterology;;   Karen Kays EXPLORATION N/A 03/16/2022   Procedure: IRRIGATION AND DEBRIDEMENT SCROTUM;  Surgeon: Heloise Purpura, MD;  Location: [facility] OR;  Service: Urology;  Laterality: N/A;   SCROTAL EXPLORATION N/A 03/28/2022   Procedure: IRRIGATION AND DEBRIDEMENT SCROTUM;  Surgeon: Crist Fat, MD;  Location: [facility] OR;  Service: Urology;  Laterality: N/A;   TONSILLECTOMY     TOTAL KNEE ARTHROPLASTY Bilateral 11/04/2018   Procedure: BILATERAL TOTAL KNEE ARTHROPLASTY;  Surgeon: Nadara Mustard, MD;   [facility] OR;  Service: Orthopedics;  Laterality: Bilateral;  spinal/epidural per anesthesiologist   TRANSMETATARSAL AMPUTATION Left 03/15/2022   Procedure: LEFT TRANSMETATARSAL AMPUTATION;  Surgeon: Nadara Mustard, MD;  Location: [facility] OR;  Service: Orthopedics;  Laterality: Left;    Family History  Problem Relation Age of Onset   Arrhythmia Mother    COPD Mother    Diabetes Mother    Kidney disease Mother    Gout Father    Kidney disease Father    Sarcoidosis Brother    Cancer Brother    Stomach cancer Neg Hx    Pancreatic cancer Neg Hx    Esophageal cancer Neg Hx    Colon cancer Neg Hx    Rectal cancer Neg Hx     Social History   Socioeconomic History   Marital status: Married    Spouse name: Olegario Messier   Number of children: 2   Years of education: Not on file   Highest education level: Associate degree: occupational, Scientist, product/process development, or vocational program  Occupational History   Occupation: Airline pilot - Chief of Staff  Tobacco Use   Smoking status: Never   Smokeless tobacco: Never  Vaping Use   Vaping status: Never Used  Substance and Sexual Activity   Alcohol use: Not Currently   Drug use: No   Sexual activity: Not Currently  Other Topics Concern   Not on file  Social History Narrative   Not on file   Social Drivers of Health   Financial Resource Strain: Low Risk  (11/09/2023)   Overall Financial Resource Strain (CARDIA)    Difficulty of Paying Living Expenses: Not hard at all  Food Insecurity: No Food Insecurity (11/09/2023)   Hunger Vital Sign    Worried About Running Out of Food in the Last Year: Never true    Ran Out of Food in the Last Year: Never true  Transportation Needs: No Transportation Needs (11/09/2023)   PRAPARE - Administrator, Civil Service (Medical): No    Lack of Transportation (Non-Medical): No  Physical Activity: Unknown (11/09/2023)   Exercise Vital Sign    Days of Exercise per Week: 0 days    Minutes of Exercise per Session:  Not on file  Stress: No Stress Concern Present (11/09/2023)   [employer] of Occupational Health - Occupational Stress Questionnaire    Feeling of Stress : Not at all  Social Connections: Moderately Integrated (11/09/2023)   Social Connection and Isolation Panel [NHANES]    Frequency of Communication with Friends and Family: Three times a week    Frequency of Social Gatherings with Friends and Family: More than three times a week    Attends Religious Services: More than 4 times per year    Active Member of [employer] or Organizations: No    Attends Banker Meetings: Not on  file    Marital Status: Married  Catering manager Violence: Not on file    Outpatient Medications Prior to Visit  Medication Sig Dispense Refill   acetaminophen (TYLENOL) 500 MG tablet Take 1,000 mg by mouth every 6 (six) hours as needed for moderate pain or headache. Rapid Release     allopurinol (ZYLOPRIM) 100 MG tablet TAKE 1 TABLET BY MOUTH EVERY DAY 90 tablet 1   apixaban (ELIQUIS) 5 MG TABS tablet Take 1 tablet (5 mg total) by mouth 2 (two) times daily. 60 tablet 5   diphenhydramine-acetaminophen (TYLENOL PM) 25-500 MG TABS tablet Take 2 tablets by mouth at bedtime as needed (sleep).     furosemide (LASIX) 40 MG tablet Take 40 mg by mouth daily.     gabapentin (NEURONTIN) 100 MG capsule TAKE 1 CAPSULE (100 MG TOTAL) BY MOUTH 3 TIMES A DAY 90 capsule 3   mupirocin ointment (BACTROBAN) 2 % APPLY TO AFFECTED AREA TWICE A DAY 22 g 0   pantoprazole (PROTONIX) 40 MG tablet TAKE 1 TABLET BY MOUTH TWICE A DAY 180 tablet 3   Potassium Chloride ER 20 MEQ TBCR Take 1 tablet by mouth daily.     predniSONE (DELTASONE) 10 MG tablet TAKE 1 TABLET (10 MG TOTAL) BY MOUTH DAILY WITH BREAKFAST. 30 tablet 3   traZODone (DESYREL) 50 MG tablet TAKE 1/2 OR 1 TABLET BY MOUTH AT BEDTIME AS NEEDED FOR SLEEP 90 tablet 2   triamcinolone (KENALOG) 0.025 % ointment APPLY TO AFFECTED AREA TWICE A DAY 30 g 0   No  facility-administered medications prior to visit.    Allergies  Allergen Reactions   Codeine Hives    Insomnia, jittery   Flomax [Tamsulosin Hcl] Nausea Only    Nausea and fatigue    Review of Systems  Constitutional:  Negative for chills and fever.  HENT:  Positive for nosebleeds. Negative for sinus pain and sore throat.   Respiratory:  Negative for shortness of breath.   Cardiovascular:  Negative for chest pain, palpitations and leg swelling.  Gastrointestinal:  Negative for abdominal pain, nausea and vomiting.  Neurological:  Negative for dizziness, focal weakness and headaches.       Objective:    Physical Exam Constitutional:      General: He is not in acute distress.    Appearance: He is not ill-appearing.  HENT:     Nose: Mucosal edema present. No nasal tenderness or rhinorrhea.     Right Nostril: No epistaxis.     Left Nostril: No epistaxis.     Mouth/Throat:     Mouth: Mucous membranes are moist.     Pharynx: Oropharynx is clear.  Eyes:     Extraocular Movements: Extraocular movements intact.     Conjunctiva/sclera: Conjunctivae normal.  Cardiovascular:     Rate and Rhythm: Normal rate.  Pulmonary:     Effort: Pulmonary effort is normal.  Musculoskeletal:     Cervical back: Normal range of motion and neck supple.  Skin:    General: Skin is warm and dry.  Neurological:     General: No focal deficit present.     Mental Status: He is alert and oriented to person, place, and time.  Psychiatric:        Mood and Affect: Mood normal.        Behavior: Behavior normal.        Thought Content: Thought content normal.      BP 122/72 (BP Location: Right Arm, Patient Position: Sitting, Cuff  Size: Large)   Pulse 88   Temp 97.6 F (36.4 C) (Temporal)   Ht 5\' 10"  (1.778 m)   Wt 239 lb (108.4 kg)   SpO2 100%   BMI 34.29 kg/m  Wt Readings from Last 3 Encounters:  11/10/23 239 lb (108.4 kg)  09/02/23 240 lb (108.9 kg)  07/08/23 226 lb (102.5 kg)        Assessment & Plan:   Problem List Items Addressed This Visit     Long term (current) use of anticoagulants   Relevant Orders   Ambulatory referral to ENT   Other Visit Diagnoses       Epistaxis, recurrent    -  Primary   Relevant Orders   Ambulatory referral to ENT      No current bleeding.  Referral to ENT.  Use saline nasal spray or gel and have Afrin on hand for the next time he has a nosebleed.  He will go to the ED if needed for prolonged nosebleed.   I am having Hennie Duos. [facility]" maintain his diphenhydramine-acetaminophen, furosemide, acetaminophen, Potassium Chloride ER, traZODone, predniSONE, pantoprazole, allopurinol, gabapentin, mupirocin ointment, triamcinolone, and apixaban.  No orders of the defined types were placed in this encounter.

## 2023-11-14 ENCOUNTER — Other Ambulatory Visit: Payer: Self-pay | Admitting: Physical Medicine & Rehabilitation

## 2023-11-14 ENCOUNTER — Other Ambulatory Visit: Payer: Self-pay | Admitting: Family Medicine

## 2023-11-14 DIAGNOSIS — L282 Other prurigo: Secondary | ICD-10-CM

## 2023-11-15 ENCOUNTER — Institutional Professional Consult (permissible substitution) (INDEPENDENT_AMBULATORY_CARE_PROVIDER_SITE_OTHER): Payer: BC Managed Care – PPO

## 2023-11-18 ENCOUNTER — Ambulatory Visit
Admission: RE | Admit: 2023-11-18 | Discharge: 2023-11-18 | Disposition: A | Discharge status: Home / Self Care | Payer: BC Managed Care – PPO | Source: Ambulatory Visit | Attending: Nurse Practitioner | Admitting: Nurse Practitioner

## 2023-11-18 DIAGNOSIS — K7469 Other cirrhosis of liver: Secondary | ICD-10-CM

## 2023-11-18 DIAGNOSIS — K7460 Unspecified cirrhosis of liver: Secondary | ICD-10-CM | POA: Diagnosis not present

## 2023-11-18 DIAGNOSIS — K7581 Nonalcoholic steatohepatitis (NASH): Secondary | ICD-10-CM

## 2023-11-22 ENCOUNTER — Encounter: Payer: Self-pay | Admitting: Family Medicine

## 2023-11-23 MED ORDER — TRAZODONE HCL 50 MG PO TABS: ORAL_TABLET | ORAL | 1 refills | Status: DC

## 2023-11-23 NOTE — Addendum Note (Signed)
 Addended by: Marinus Maw on: 11/23/2023 09:56 AM   Modules accepted: Orders

## 2023-11-23 NOTE — Telephone Encounter (Signed)
Ok for refill of trazodone? Originally prescribed by hospital and pt reports it has been helping

## 2023-12-01 ENCOUNTER — Institutional Professional Consult (permissible substitution) (INDEPENDENT_AMBULATORY_CARE_PROVIDER_SITE_OTHER): Payer: BC Managed Care – PPO

## 2023-12-10 DIAGNOSIS — N189 Chronic kidney disease, unspecified: Secondary | ICD-10-CM | POA: Diagnosis not present

## 2023-12-10 DIAGNOSIS — N184 Chronic kidney disease, stage 4 (severe): Secondary | ICD-10-CM | POA: Diagnosis not present

## 2023-12-10 DIAGNOSIS — N2581 Secondary hyperparathyroidism of renal origin: Secondary | ICD-10-CM | POA: Diagnosis not present

## 2023-12-10 DIAGNOSIS — N179 Acute kidney failure, unspecified: Secondary | ICD-10-CM | POA: Diagnosis not present

## 2023-12-10 DIAGNOSIS — E1122 Type 2 diabetes mellitus with diabetic chronic kidney disease: Secondary | ICD-10-CM | POA: Diagnosis not present

## 2023-12-10 DIAGNOSIS — I129 Hypertensive chronic kidney disease with stage 1 through stage 4 chronic kidney disease, or unspecified chronic kidney disease: Secondary | ICD-10-CM | POA: Diagnosis not present

## 2023-12-11 LAB — LAB REPORT - SCANNED
Albumin, Urine POC: 92.2
Creatinine, POC: 135.1 mg/dL
EGFR: 22
Microalb Creat Ratio: 68

## 2023-12-20 ENCOUNTER — Encounter: Payer: Self-pay | Admitting: Cardiovascular Disease

## 2023-12-22 ENCOUNTER — Telehealth: Payer: Self-pay | Admitting: Pharmacy Technician

## 2023-12-22 ENCOUNTER — Other Ambulatory Visit (HOSPITAL_COMMUNITY): Payer: Self-pay

## 2023-12-22 NOTE — Telephone Encounter (Signed)
Pharmacy Patient Advocate Encounter   Received notification from Patient Advice Request messages that prior authorization for eliquis is required/requested.   Insurance verification completed.   The patient is insured through [employer].   Per test claim: PA required; PA submitted to above mentioned insurance via CoverMyMeds Key/confirmation #/EOC [id number] Status is pending

## 2023-12-22 NOTE — Telephone Encounter (Signed)
Pharmacy Patient Advocate Encounter  Received notification from [employer] that Prior Authorization for Eliquis has been APPROVED from 11/22/23 to 12/21/24. Ran test claim, Copay is $50.00 one month. This test claim was processed through [facility] copay amounts may vary at other pharmacies due to pharmacy/plan contracts, or as the patient moves through the different stages of their insurance plan.   PA #/Case ID/Reference #: [id number]

## 2024-01-05 ENCOUNTER — Encounter: Payer: Self-pay | Admitting: Family Medicine

## 2024-01-05 ENCOUNTER — Ambulatory Visit: Payer: BC Managed Care – PPO | Admitting: Family Medicine

## 2024-01-05 VITALS — BP 134/78 | HR 88 | Temp 98.0°F | Ht 70.0 in | Wt 241.0 lb

## 2024-01-05 DIAGNOSIS — R051 Acute cough: Secondary | ICD-10-CM

## 2024-01-05 DIAGNOSIS — H8110 Benign paroxysmal vertigo, unspecified ear: Secondary | ICD-10-CM | POA: Diagnosis not present

## 2024-01-05 DIAGNOSIS — J101 Influenza due to other identified influenza virus with other respiratory manifestations: Secondary | ICD-10-CM

## 2024-01-05 DIAGNOSIS — R110 Nausea: Secondary | ICD-10-CM

## 2024-01-05 DIAGNOSIS — Z7901 Long term (current) use of anticoagulants: Secondary | ICD-10-CM

## 2024-01-05 LAB — POC COVID19 BINAXNOW: SARS Coronavirus 2 Ag: NEGATIVE

## 2024-01-05 LAB — POCT INFLUENZA A/B
Influenza A, POC: POSITIVE — AB
Influenza B, POC: NEGATIVE

## 2024-01-05 MED ORDER — ONDANSETRON HCL 4 MG PO TABS: 4.0000 mg | ORAL_TABLET | Freq: Three times a day (TID) | ORAL | 0 refills | Status: DC | PRN

## 2024-01-05 MED ORDER — OSELTAMIVIR PHOSPHATE 30 MG PO CAPS: 30.0000 mg | ORAL_CAPSULE | Freq: Every day | ORAL | 0 refills | Status: DC

## 2024-01-05 MED ORDER — MECLIZINE HCL 25 MG PO TABS
25.0000 mg | ORAL_TABLET | Freq: Two times a day (BID) | ORAL | 0 refills | Status: AC | PRN
Start: 2024-01-05 — End: ?

## 2024-01-05 MED ORDER — BENZONATATE 200 MG PO CAPS: 200.0000 mg | ORAL_CAPSULE | Freq: Two times a day (BID) | ORAL | 0 refills | Status: DC | PRN

## 2024-01-05 NOTE — Patient Instructions (Signed)
You are positive for influenza A.  I sent in the renal dosing of Tamiflu for you to take daily x 5 days.  Treat your symptoms with plain Mucinex and try the Tessalon Perles for cough. Stay hydrated  Your dizziness appears to be a benign condition called vertigo.  If you have any new or worsening symptoms or if you are not improving in the next week or 2, let me know.

## 2024-01-05 NOTE — Progress Notes (Signed)
Subjective:  Jeffrey Ayala is a 63 y.o. male who presents for a a 24 hour hx of cough, ST and fatigue.   He also c/o a 1 wk hx of dizziness that he describes as a spinning sensation when he is laying down in bed and turns his head from side-to-side.  He denies dizziness upon standing.  He denies ear pain, fullness, tinnitus or headache.  No numbness, tingling or weakness.   ROS as in subjective.   Objective: Vitals:   01/05/24 1339  BP: 134/78  Pulse: 88  Temp: 98 F (36.7 C)  SpO2: 100%    General appearance: Alert, WD/WN, no distress, mildly ill appearing                             Skin: warm, no rash                           Head: no sinus tenderness                            Eyes: conjunctiva normal, corneas clear, PERRLA                            Ears: pearly TMs, external ear canals normal          Neck: supple, no adenopathy, no thyromegaly, nontender                          Heart: Regular rate, irregular rhythm                          Lungs: CTA bilaterally, no wheezes, rales, or rhonchi Neuro: PERRLA, EOMs intact, CNs intact      Assessment: Influenza A - Plan: oseltamivir (TAMIFLU) 30 MG capsule  Acute cough - Plan: POC COVID-19 BinaxNow, POCT Influenza A/B  Benign paroxysmal positional vertigo, unspecified laterality - Plan: meclizine (ANTIVERT) 25 MG tablet  Long term (current) use of anticoagulants  Nausea   Plan: Positive influenza A test.  Negative Covid  Renal dosing with Tamiflu prescribed.  He is not on dialysis States he did not take his medications this morning including his Lasix due to nausea.  He is out of Zofran.  I refilled the medication. Tessalon Perles prescribed.  Discussed taking plain Mucinex.  Stay hydrated and rest. Discussed that his dizziness sounds like BPPV and he may try meclizine.  He will let me know if this is worsening or not improving in the next 1 to 2 weeks.  Consider referral to PT if needed.

## 2024-01-11 ENCOUNTER — Encounter: Payer: Self-pay | Admitting: Family Medicine

## 2024-01-11 ENCOUNTER — Ambulatory Visit (INDEPENDENT_AMBULATORY_CARE_PROVIDER_SITE_OTHER): Payer: BC Managed Care – PPO

## 2024-01-11 ENCOUNTER — Ambulatory Visit (INDEPENDENT_AMBULATORY_CARE_PROVIDER_SITE_OTHER): Payer: BC Managed Care – PPO | Admitting: Family Medicine

## 2024-01-11 VITALS — BP 122/80 | HR 94 | Temp 98.1°F | Ht 70.0 in | Wt 229.0 lb

## 2024-01-11 DIAGNOSIS — R5383 Other fatigue: Secondary | ICD-10-CM | POA: Diagnosis not present

## 2024-01-11 DIAGNOSIS — R051 Acute cough: Secondary | ICD-10-CM | POA: Diagnosis not present

## 2024-01-11 DIAGNOSIS — N184 Chronic kidney disease, stage 4 (severe): Secondary | ICD-10-CM

## 2024-01-11 DIAGNOSIS — J069 Acute upper respiratory infection, unspecified: Secondary | ICD-10-CM

## 2024-01-11 DIAGNOSIS — R531 Weakness: Secondary | ICD-10-CM

## 2024-01-11 DIAGNOSIS — R062 Wheezing: Secondary | ICD-10-CM

## 2024-01-11 DIAGNOSIS — R0602 Shortness of breath: Secondary | ICD-10-CM

## 2024-01-11 DIAGNOSIS — I517 Cardiomegaly: Secondary | ICD-10-CM | POA: Diagnosis not present

## 2024-01-11 DIAGNOSIS — B9689 Other specified bacterial agents as the cause of diseases classified elsewhere: Secondary | ICD-10-CM | POA: Diagnosis not present

## 2024-01-11 DIAGNOSIS — R0989 Other specified symptoms and signs involving the circulatory and respiratory systems: Secondary | ICD-10-CM | POA: Diagnosis not present

## 2024-01-11 DIAGNOSIS — R059 Cough, unspecified: Secondary | ICD-10-CM | POA: Diagnosis not present

## 2024-01-11 LAB — COMPREHENSIVE METABOLIC PANEL
ALT: 30 U/L (ref 0–53)
AST: 41 U/L — ABNORMAL HIGH (ref 0–37)
Albumin: 4.1 g/dL (ref 3.5–5.2)
Alkaline Phosphatase: 157 U/L — ABNORMAL HIGH (ref 39–117)
BUN: 31 mg/dL — ABNORMAL HIGH (ref 6–23)
CO2: 28 meq/L (ref 19–32)
Calcium: 9.4 mg/dL (ref 8.4–10.5)
Chloride: 96 meq/L (ref 96–112)
Creatinine, Ser: 2.87 mg/dL — ABNORMAL HIGH (ref 0.40–1.50)
GFR: 22.71 mL/min — ABNORMAL LOW (ref >60.00)
Glucose, Bld: 117 mg/dL — ABNORMAL HIGH (ref 70–99)
Potassium: 3.5 meq/L (ref 3.5–5.1)
Sodium: 136 meq/L (ref 135–145)
Total Bilirubin: 1.2 mg/dL (ref 0.2–1.2)
Total Protein: 8.1 g/dL (ref 6.0–8.3)

## 2024-01-11 LAB — CBC WITH DIFFERENTIAL/PLATELET
Basophils Absolute: 0 10*3/uL (ref 0.0–0.1)
Basophils Relative: 0.3 % (ref 0.0–3.0)
Eosinophils Absolute: 0 10*3/uL (ref 0.0–0.7)
Eosinophils Relative: 0.3 % (ref 0.0–5.0)
HCT: 37.4 % — ABNORMAL LOW (ref 39.0–52.0)
Hemoglobin: 12.6 g/dL — ABNORMAL LOW (ref 13.0–17.0)
Lymphocytes Relative: 9 % — ABNORMAL LOW (ref 12.0–46.0)
Lymphs Abs: 1.3 10*3/uL (ref 0.7–4.0)
MCHC: 33.7 g/dL (ref 30.0–36.0)
MCV: 93 fL (ref 78.0–100.0)
Monocytes Absolute: 1.2 10*3/uL — ABNORMAL HIGH (ref 0.1–1.0)
Monocytes Relative: 8.8 % (ref 3.0–12.0)
Neutro Abs: 11.4 10*3/uL — ABNORMAL HIGH (ref 1.4–7.7)
Neutrophils Relative %: 81.6 % — ABNORMAL HIGH (ref 43.0–77.0)
Platelets: 313 10*3/uL (ref 150.0–400.0)
RBC: 4.02 Mil/uL — ABNORMAL LOW (ref 4.22–5.81)
RDW: 16.6 % — ABNORMAL HIGH (ref 11.5–15.5)
WBC: 14 10*3/uL — ABNORMAL HIGH (ref 4.0–10.5)

## 2024-01-11 MED ORDER — AZITHROMYCIN 250 MG PO TABS: ORAL_TABLET | ORAL | 0 refills | Status: AC

## 2024-01-11 MED ORDER — ALBUTEROL SULFATE HFA 108 (90 BASE) MCG/ACT IN AERS: 2.0000 | INHALATION_SPRAY | Freq: Four times a day (QID) | RESPIRATORY_TRACT | 0 refills | Status: DC | PRN

## 2024-01-11 MED ORDER — PROMETHAZINE HCL 6.25 MG/5ML PO SOLN: 6.2500 mg | Freq: Four times a day (QID) | ORAL | 0 refills | Status: DC | PRN

## 2024-01-11 NOTE — Progress Notes (Unsigned)
   Acute Office Visit  Subjective:     Patient ID: Jeffrey Ayala, male    DOB: 1960-12-21, 62 y.o.   MRN: [id number]  No chief complaint on file.   HPI Patient is in today for evaluation of worsening cough, fatigue, weakness for the last 6 days. Was seen in this office and treated for flu on 01/05/24. Concerned for pneumonia. Denies abdominal pain, nausea, vomiting, diarrhea, rash, fever, chills, other symptoms.  Medical hx as outlined below.  ROS Per HPI      Objective:    There were no vitals taken for this visit.   Physical Exam Vitals and nursing note reviewed.  Constitutional:      General: He is not in acute distress.    Appearance: He is ill-appearing.  HENT:     Head: Normocephalic and atraumatic.     Nose: Congestion present.     Mouth/Throat:     Mouth: Mucous membranes are moist.     Pharynx: Oropharynx is clear. No oropharyngeal exudate or posterior oropharyngeal erythema.     Comments: Hoarse voice Eyes:     Extraocular Movements: Extraocular movements intact.  Cardiovascular:     Rate and Rhythm: Normal rate and regular rhythm.  Pulmonary:     Effort: Pulmonary effort is normal. No respiratory distress.     Breath sounds: Rhonchi present. No wheezing or rales.     Comments: Productive cough Musculoskeletal:     Cervical back: Normal range of motion.  Lymphadenopathy:     Cervical: No cervical adenopathy.  Neurological:     General: No focal deficit present.     Mental Status: He is alert and oriented to person, place, and time.  Psychiatric:        Mood and Affect: Mood normal.        Behavior: Behavior normal.    No results found for any visits on 01/11/24.      Assessment & Plan:  ***  No orders of the defined types were placed in this encounter.   No follow-ups on file.  Moshe Cipro, FNP

## 2024-01-11 NOTE — Telephone Encounter (Signed)
Called pt and got him an appt today w Moshe Cipro

## 2024-01-11 NOTE — Patient Instructions (Signed)
We are checking labs today, will be in contact with any results that require further attention  We are getting an xray today. We will be in contact with any abnormal results that require further attention.  Follow-up with me for new or worsening symptoms.

## 2024-01-12 ENCOUNTER — Emergency Department (HOSPITAL_COMMUNITY)
Admission: EM | Admit: 2024-01-12 | Discharge: 2024-01-12 | Disposition: A | Discharge status: Home / Self Care | Payer: BC Managed Care – PPO | Attending: Emergency Medicine | Admitting: Emergency Medicine

## 2024-01-12 ENCOUNTER — Encounter: Payer: Self-pay | Admitting: Family Medicine

## 2024-01-12 ENCOUNTER — Emergency Department (HOSPITAL_COMMUNITY): Payer: BC Managed Care – PPO

## 2024-01-12 ENCOUNTER — Other Ambulatory Visit: Payer: Self-pay

## 2024-01-12 DIAGNOSIS — R0981 Nasal congestion: Secondary | ICD-10-CM | POA: Diagnosis not present

## 2024-01-12 DIAGNOSIS — Z7901 Long term (current) use of anticoagulants: Secondary | ICD-10-CM | POA: Diagnosis not present

## 2024-01-12 DIAGNOSIS — J189 Pneumonia, unspecified organism: Secondary | ICD-10-CM | POA: Diagnosis not present

## 2024-01-12 DIAGNOSIS — R062 Wheezing: Secondary | ICD-10-CM | POA: Insufficient documentation

## 2024-01-12 DIAGNOSIS — Z79899 Other long term (current) drug therapy: Secondary | ICD-10-CM | POA: Diagnosis not present

## 2024-01-12 DIAGNOSIS — R161 Splenomegaly, not elsewhere classified: Secondary | ICD-10-CM | POA: Diagnosis not present

## 2024-01-12 DIAGNOSIS — J111 Influenza due to unidentified influenza virus with other respiratory manifestations: Secondary | ICD-10-CM | POA: Diagnosis not present

## 2024-01-12 DIAGNOSIS — E119 Type 2 diabetes mellitus without complications: Secondary | ICD-10-CM | POA: Insufficient documentation

## 2024-01-12 DIAGNOSIS — I4891 Unspecified atrial fibrillation: Secondary | ICD-10-CM | POA: Insufficient documentation

## 2024-01-12 DIAGNOSIS — J069 Acute upper respiratory infection, unspecified: Secondary | ICD-10-CM | POA: Diagnosis not present

## 2024-01-12 DIAGNOSIS — N184 Chronic kidney disease, stage 4 (severe): Secondary | ICD-10-CM | POA: Insufficient documentation

## 2024-01-12 LAB — CBC WITH DIFFERENTIAL/PLATELET
Abs Immature Granulocytes: 0.18 10*3/uL — ABNORMAL HIGH (ref 0.00–0.07)
Basophils Absolute: 0 10*3/uL (ref 0.0–0.1)
Basophils Relative: 0 %
Eosinophils Absolute: 0 10*3/uL (ref 0.0–0.5)
Eosinophils Relative: 0 %
HCT: 36.9 % — ABNORMAL LOW (ref 39.0–52.0)
Hemoglobin: 12.3 g/dL — ABNORMAL LOW (ref 13.0–17.0)
Immature Granulocytes: 1 %
Lymphocytes Relative: 4 %
Lymphs Abs: 0.6 10*3/uL — ABNORMAL LOW (ref 0.7–4.0)
MCH: 31.2 pg (ref 26.0–34.0)
MCHC: 33.3 g/dL (ref 30.0–36.0)
MCV: 93.7 fL (ref 80.0–100.0)
Monocytes Absolute: 0.9 10*3/uL (ref 0.1–1.0)
Monocytes Relative: 6 %
Neutro Abs: 12.6 10*3/uL — ABNORMAL HIGH (ref 1.7–7.7)
Neutrophils Relative %: 89 %
Platelets: 311 10*3/uL (ref 150–400)
RBC: 3.94 MIL/uL — ABNORMAL LOW (ref 4.22–5.81)
RDW: 15.4 % (ref 11.5–15.5)
WBC: 14.2 10*3/uL — ABNORMAL HIGH (ref 4.0–10.5)
nRBC: 0 % (ref 0.0–0.2)

## 2024-01-12 LAB — COMPREHENSIVE METABOLIC PANEL
ALT: 31 U/L (ref 0–44)
AST: 32 U/L (ref 15–41)
Albumin: 3.2 g/dL — ABNORMAL LOW (ref 3.5–5.0)
Alkaline Phosphatase: 129 U/L — ABNORMAL HIGH (ref 38–126)
Anion gap: 13 (ref 5–15)
BUN: 27 mg/dL — ABNORMAL HIGH (ref 8–23)
CO2: 24 mmol/L (ref 22–32)
Calcium: 8.8 mg/dL — ABNORMAL LOW (ref 8.9–10.3)
Chloride: 96 mmol/L — ABNORMAL LOW (ref 98–111)
Creatinine, Ser: 2.78 mg/dL — ABNORMAL HIGH (ref 0.61–1.24)
GFR, Estimated: 25 mL/min — ABNORMAL LOW (ref >60)
Glucose, Bld: 158 mg/dL — ABNORMAL HIGH (ref 70–99)
Potassium: 3.1 mmol/L — ABNORMAL LOW (ref 3.5–5.1)
Sodium: 133 mmol/L — ABNORMAL LOW (ref 135–145)
Total Bilirubin: 1.2 mg/dL (ref 0.0–1.2)
Total Protein: 7.1 g/dL (ref 6.5–8.1)

## 2024-01-12 LAB — URINALYSIS, W/ REFLEX TO CULTURE (INFECTION SUSPECTED)
Bacteria, UA: NONE SEEN
Bilirubin Urine: NEGATIVE
Glucose, UA: NEGATIVE mg/dL
Ketones, ur: NEGATIVE mg/dL
Nitrite: NEGATIVE
Protein, ur: 30 mg/dL — AB
Specific Gravity, Urine: 1.017 (ref 1.005–1.030)
pH: 5 (ref 5.0–8.0)

## 2024-01-12 LAB — I-STAT CG4 LACTIC ACID, ED
Lactic Acid, Venous: 1.5 mmol/L (ref 0.5–1.9)
Lactic Acid, Venous: 2.2 mmol/L (ref 0.5–1.9)

## 2024-01-12 LAB — PHOSPHORUS: Phosphorus: 2.6 mg/dL (ref 2.5–4.6)

## 2024-01-12 MED ORDER — METHYLPREDNISOLONE SODIUM SUCC 125 MG IJ SOLR
125.0000 mg | INTRAMUSCULAR | Status: AC
  Administered 2024-01-12: 125 mg via INTRAVENOUS
  Filled 2024-01-12: qty 2

## 2024-01-12 MED ORDER — SODIUM CHLORIDE 0.9 % IV SOLN
1.0000 g | Freq: Once | INTRAVENOUS | Status: AC
  Administered 2024-01-12: 1 g via INTRAVENOUS
  Filled 2024-01-12: qty 10

## 2024-01-12 MED ORDER — IPRATROPIUM-ALBUTEROL 0.5-2.5 (3) MG/3ML IN SOLN
3.0000 mL | Freq: Once | RESPIRATORY_TRACT | Status: AC
  Administered 2024-01-12: 3 mL via RESPIRATORY_TRACT
  Filled 2024-01-12: qty 3

## 2024-01-12 MED ORDER — AMOXICILLIN 500 MG PO CAPS: 1000.0000 mg | ORAL_CAPSULE | Freq: Two times a day (BID) | ORAL | 0 refills | Status: AC

## 2024-01-12 MED ORDER — AZITHROMYCIN 250 MG PO TABS
500.0000 mg | ORAL_TABLET | ORAL | Status: AC
  Administered 2024-01-12: 500 mg via ORAL
  Filled 2024-01-12: qty 2

## 2024-01-12 MED ORDER — POTASSIUM CHLORIDE CRYS ER 20 MEQ PO TBCR
40.0000 meq | EXTENDED_RELEASE_TABLET | Freq: Once | ORAL | Status: AC
  Administered 2024-01-12: 40 meq via ORAL
  Filled 2024-01-12: qty 2

## 2024-01-12 MED ORDER — SODIUM CHLORIDE 0.9 % IV BOLUS
500.0000 mL | Freq: Once | INTRAVENOUS | Status: AC
  Administered 2024-01-12: 500 mL via INTRAVENOUS

## 2024-01-12 NOTE — ED Triage Notes (Addendum)
Pt. Stated, Jeffrey Ayala had a cold and cough for 2 weeks, went to Dr's office yesterday and they said my WBC were high. And ;my Renal function was high.. Pt was positive for the flu on Feb. 12, Pt had a chest xray yesterday.

## 2024-01-12 NOTE — ED Provider Notes (Signed)
[place] EMERGENCY DEPARTMENT AT [facility] Provider Note   CSN: [id number] Arrival date & time: 01/12/24  4098     History  Chief Complaint  Patient presents with   Nasal Congestion   Cough    Jeffrey Ayala is a 63 y.o. male.  63 year old male with history of cirrhosis, diabetes, atrial fibrillation, and cardiac arrest who presents emergency department with URI symptoms.  Patient reports that over the past week and a half he has had a cough.  Also has had generalized weakness.  Occasionally will have nausea and vomiting.  Denies any shortness of breath.  Tested positive for the flu on February 12.  Went to his primary doctor yesterday and had a chest x-ray that did not show evidence of pneumonia.  Labs show that his creatinine was elevated (but at baseline) and did have a mild leukocytosis.  Decided to come into the emergency department today due to concerns for possible sepsis.       Home Medications Prior to Admission medications   Medication Sig Start Date End Date Taking? Authorizing Provider  amoxicillin (AMOXIL) 500 MG capsule Take 2 capsules (1,000 mg total) by mouth 2 (two) times daily for 5 days. 01/12/24 01/17/24 Yes Glyn Ade, MD  acetaminophen (TYLENOL) 500 MG tablet Take 1,000 mg by mouth every 6 (six) hours as needed for moderate pain or headache. Rapid Release    [provider]  albuterol (VENTOLIN HFA) 108 (90 Base) MCG/ACT inhaler Inhale 2 puffs into the lungs every 6 (six) hours as needed for wheezing or shortness of breath. 01/11/24   Moshe Cipro, FNP  allopurinol (ZYLOPRIM) 100 MG tablet TAKE 1 TABLET BY MOUTH EVERY DAY 10/04/23   Henson, Vickie L, NP-C  apixaban (ELIQUIS) 5 MG TABS tablet Take 1 tablet (5 mg total) by mouth 2 (two) times daily. 10/15/23   O'NealRonnald Ramp, MD  azithromycin (ZITHROMAX) 250 MG tablet Take 2 tablets on day 1, then 1 tablet daily on days 2 through 5 01/11/24 01/16/24  Moshe Cipro, FNP   benzonatate (TESSALON) 200 MG capsule Take 1 capsule (200 mg total) by mouth 2 (two) times daily as needed for cough. 01/05/24   Henson, Vickie L, NP-C  diphenhydramine-acetaminophen (TYLENOL PM) 25-500 MG TABS tablet Take 2 tablets by mouth at bedtime as needed (sleep).    [provider]  furosemide (LASIX) 40 MG tablet Take 40 mg by mouth daily.    [provider]  gabapentin (NEURONTIN) 100 MG capsule TAKE 1 CAPSULE (100 MG TOTAL) BY MOUTH 3 TIMES A DAY 10/12/23   Adonis Huguenin, NP  meclizine (ANTIVERT) 25 MG tablet Take 1 tablet (25 mg total) by mouth 2 (two) times daily as needed for dizziness. 01/05/24   Henson, Vickie L, NP-C  mupirocin ointment (BACTROBAN) 2 % APPLY TO AFFECTED AREA TWICE A DAY 11/15/23   Henson, Vickie L, NP-C  ondansetron (ZOFRAN) 4 MG tablet Take 1 tablet (4 mg total) by mouth every 8 (eight) hours as needed for nausea or vomiting. 01/05/24   Henson, Vickie L, NP-C  oseltamivir (TAMIFLU) 30 MG capsule Take 1 capsule (30 mg total) by mouth daily. 01/05/24   Henson, Vickie L, NP-C  pantoprazole (PROTONIX) 40 MG tablet TAKE 1 TABLET BY MOUTH TWICE A DAY 09/08/23   Armbruster, Willaim Rayas, MD  Potassium Chloride ER 20 MEQ TBCR Take 1 tablet by mouth daily. 01/11/23   [provider]  promethazine (PHENERGAN) 6.25 MG/5ML solution Take 5 mLs (  6.25 mg total) by mouth every 6 (six) hours as needed for nausea or vomiting. 01/11/24   Moshe Cipro, FNP  traZODone (DESYREL) 50 MG tablet TAKE 1/2 OR 1 TABLET BY MOUTH AT BEDTIME AS NEEDED FOR SLEEP 11/23/23   Henson, Vickie L, NP-C  triamcinolone (KENALOG) 0.025 % ointment APPLY TO AFFECTED AREA TWICE A DAY 11/15/23   Henson, Vickie L, NP-C      Allergies    Codeine and Flomax [tamsulosin hcl]    Review of Systems   Review of Systems  Physical Exam Updated Vital Signs BP 121/83   Pulse 99   Temp 99 F (37.2 C) (Oral)   Resp 18   SpO2 98%  Physical Exam Vitals and nursing note reviewed.   Constitutional:      General: He is not in acute distress.    Appearance: He is well-developed.  HENT:     Head: Normocephalic and atraumatic.     Right Ear: External ear normal.     Left Ear: External ear normal.     Nose: Nose normal.  Eyes:     Extraocular Movements: Extraocular movements intact.     Conjunctiva/sclera: Conjunctivae normal.     Pupils: Pupils are equal, round, and reactive to light.  Cardiovascular:     Rate and Rhythm: Normal rate. Rhythm irregular.     Heart sounds: Normal heart sounds.  Pulmonary:     Effort: Pulmonary effort is normal. No respiratory distress.     Breath sounds: Wheezing present.  Musculoskeletal:     Cervical back: Normal range of motion and neck supple.  Skin:    General: Skin is warm and dry.  Neurological:     Mental Status: He is alert. Mental status is at baseline.  Psychiatric:        Mood and Affect: Mood normal.        Behavior: Behavior normal.     ED Results / Procedures / Treatments   Labs (all labs ordered are listed, but only abnormal results are displayed) Labs Reviewed  COMPREHENSIVE METABOLIC PANEL - Abnormal; Notable for the following components:      Result Value   Sodium 133 (*)    Potassium 3.1 (*)    Chloride 96 (*)    Glucose, Bld 158 (*)    BUN 27 (*)    Creatinine, Ser 2.78 (*)    Calcium 8.8 (*)    Albumin 3.2 (*)    Alkaline Phosphatase 129 (*)    GFR, Estimated 25 (*)    All other components within normal limits  CBC WITH DIFFERENTIAL/PLATELET - Abnormal; Notable for the following components:   WBC 14.2 (*)    RBC 3.94 (*)    Hemoglobin 12.3 (*)    HCT 36.9 (*)    Neutro Abs 12.6 (*)    Lymphs Abs 0.6 (*)    Abs Immature Granulocytes 0.18 (*)    All other components within normal limits  URINALYSIS, W/ REFLEX TO CULTURE (INFECTION SUSPECTED) - Abnormal; Notable for the following components:   Hgb urine dipstick SMALL (*)    Protein, ur 30 (*)    Leukocytes,Ua TRACE (*)    All other  components within normal limits  I-STAT CG4 LACTIC ACID, ED - Abnormal; Notable for the following components:   Lactic Acid, Venous 2.2 (*)    All other components within normal limits  PHOSPHORUS  I-STAT CG4 LACTIC ACID, ED    EKG None  Radiology CT Chest Wo Contrast  Result Date: 01/12/2024 CLINICAL DATA:  Worsening cough and congestion. Leukocytosis. Positive flu. EXAM: CT CHEST WITHOUT CONTRAST TECHNIQUE: Multidetector CT imaging of the chest was performed following the standard protocol without IV contrast. RADIATION DOSE REDUCTION: This exam was performed according to the departmental dose-optimization program which includes automated exposure control, adjustment of the mA and/or kV according to patient size and/or use of iterative reconstruction technique. COMPARISON:  Chest CT dated 02/22/2022. FINDINGS: Evaluation of this exam is limited in the absence of intravenous contrast. Cardiovascular: There is no cardiomegaly or pericardial effusion. There is coronary vascular calcification. Mild atherosclerotic calcification of the thoracic aorta. No aneurysmal dilatation. The central pulmonary arteries are grossly unremarkable. Mediastinum/Nodes: Probable mildly enlarged right hilar lymph nodes measure up to 15 mm short axis. Subcarinal lymph node measures 11 mm. The esophagus is grossly unremarkable. No mediastinal fluid collection. Lungs/Pleura: Clusters of micro nodularity in the right upper lobe a right lower lobe, and lingula consistent with pneumonia, possibly viral or atypical in etiology versus aspiration. There is no pleural effusion or pneumothorax. The central airways are patent. Upper Abdomen: Splenomegaly measuring 18 cm in length. Irregularity of the liver contour suggestive of cirrhosis. Musculoskeletal: Degenerative changes of the spine. No acute osseous pathology. IMPRESSION: 1. Multifocal pneumonia, possibly viral or atypical in etiology versus aspiration. 2. Splenomegaly. 3.  Cirrhosis. 4.  Aortic Atherosclerosis (ICD10-I70.0). Electronically Signed   By: Elgie Collard M.D.   On: 01/12/2024 15:50   DG Chest 2 View Result Date: 01/11/2024 CLINICAL DATA:  Worsening cough with shortness of breath and congestion for 1.5 weeks. EXAM: CHEST - 2 VIEW COMPARISON:  Radiographs 10/01/2022 and 06/09/2022.  CT 02/22/2022. FINDINGS: Interval right IJ central venous catheter removal. Cardiomegaly has slightly improved. There is vascular congestion with improved aeration of both lung bases. No edema, confluent airspace disease, pneumothorax or significant pleural effusion. Stable mild degenerative changes in the spine. IMPRESSION: Improved cardiomegaly and vascular congestion. No evidence of pneumonia or edema. Electronically Signed   By: Carey Bullocks M.D.   On: 01/11/2024 17:51    Procedures Procedures    Medications Ordered in ED Medications  ipratropium-albuterol (DUONEB) 0.5-2.5 (3) MG/3ML nebulizer solution 3 mL (3 mLs Nebulization Given 01/12/24 1415)  methylPREDNISolone sodium succinate (SOLU-MEDROL) 125 mg/2 mL injection 125 mg (125 mg Intravenous Given 01/12/24 1416)  sodium chloride 0.9 % bolus 500 mL (0 mLs Intravenous Stopped 01/12/24 1547)  azithromycin (ZITHROMAX) tablet 500 mg (500 mg Oral Given 01/12/24 1415)  potassium chloride SA (KLOR-CON M) CR tablet 40 mEq (40 mEq Oral Given 01/12/24 1415)  cefTRIAXone (ROCEPHIN) 1 g in sodium chloride 0.9 % 100 mL IVPB (0 g Intravenous Stopped 01/12/24 1744)    ED Course/ Medical Decision Making/ A&P Clinical Course as of 01/12/24 1958  Wed Jan 12, 2024  1319 Creatinine(!): 2.78 At baseline [RP]  1533 Stable 62 YOM with expansive medical hx. ESLD, BKA.  Diagnosed with flu outpatient. Here with worsening symptoms.  Getting obstructive disease treatment. CT Chest pending. [CC]    Clinical Course User Index [CC] Glyn Ade, MD [RP] Rondel Baton, MD                                 Medical Decision  Making Amount and/or Complexity of Data Reviewed Labs: ordered. Decision-making details documented in ED Course. Radiology: ordered.  Risk Prescription drug management.   Jeffrey Ayala is a 63 y.o. male with comorbidities that  complicate the patient evaluation including cirrhosis, diabetes, atrial fibrillation, and cardiac arrest who presents emergency department with URI symptoms.    Initial Ddx:  Influenza, pneumonia, volume overload, AKI  MDM/Course:  Patient presents to the emergency department with persistent respiratory symptoms after recently being diagnosed with the flu.  Already completed a course of Tamiflu.  Had chest x-ray yesterday that did not show pneumonia.  Satting well on room air.  Is tachycardic and febrile with diffuse expiratory wheezing.  Was given steroids and nebulizer with some improvement of his symptoms.  Initially was suspecting that it was from influenza so we will hold off on sepsis bundle at this time.  Is awaiting CT scan at this time to evaluate for possible occult pneumonia that his x-ray missed yesterday.  Upon re-evaluation was stable.  Signed out to oncoming physician awaiting CT results.  This patient presents to the ED for concern of complaints listed in HPI, this involves an extensive number of treatment options, and is a complaint that carries with it a high risk of complications and morbidity. Disposition including potential need for admission considered.   Dispo: Pending remainder of workup  Additional history obtained from spouse Records reviewed Outpatient Clinic Notes The following labs were independently interpreted: Chemistry and show CKD I personally reviewed and interpreted the pt's EKG: see above for interpretation  I have reviewed the patients home medications and made adjustments as needed  Portions of this note were generated with Dragon dictation software. Dictation errors may occur despite best attempts at proofreading.     Final Clinical Impression(s) / ED Diagnoses Final diagnoses:  Upper respiratory tract infection, unspecified type    Rx / DC Orders ED Discharge Orders          Ordered    amoxicillin (AMOXIL) 500 MG capsule  2 times daily        01/12/24 1809              Rondel Baton, MD 01/12/24 1958

## 2024-01-12 NOTE — ED Provider Notes (Signed)
Care of patient received from prior provider at 6:09 PM, please see their note for complete H/P and care plan.  Received handoff per ED course.  Clinical Course as of 01/12/24 1809  Wed Jan 12, 2024  1319 Creatinine(!): 2.78 At baseline [RP]  1533 Stable 62 YOM with expansive medical hx. ESLD, BKA.  Diagnosed with flu outpatient. Here with worsening symptoms.  Getting obstructive disease treatment. CT Chest pending. [CC]    Clinical Course User Index [CC] Glyn Ade, MD [RP] Rondel Baton, MD    Reassessment: Care patient received from prior provider at time of handoff. Over 7 days of fever cough congestion 12 days of total upper respiratory symptoms.  Diagnosed with the flu.  CT pending for further evaluation for possible secondary pneumonia. CT did indeed show likely multifocal pneumonia.  He is already gotten azithromycin.  Will add on Rocephin.  Will transition him to p.o. Amoxil and plan to follow-up with primary care doctor in 5 days.  Already has azithromycin at home per wife at bedside.  Disposition:  I have considered need for hospitalization, however, considering all of the above, I believe this patient is stable for discharge at this time.  Patient/family educated about specific return precautions for given chief complaint and symptoms.  Patient/family educated about follow-up with PCP.     Patient/family expressed understanding of return precautions and need for follow-up. Patient spoken to regarding all imaging and laboratory results and appropriate follow up for these results. All education provided in verbal form with additional information in written form. Time was allowed for answering of patient questions. Patient discharged.    Emergency Department Medication Summary:   Medications  ipratropium-albuterol (DUONEB) 0.5-2.5 (3) MG/3ML nebulizer solution 3 mL (3 mLs Nebulization Given 01/12/24 1415)  methylPREDNISolone sodium succinate (SOLU-MEDROL) 125  mg/2 mL injection 125 mg (125 mg Intravenous Given 01/12/24 1416)  sodium chloride 0.9 % bolus 500 mL (0 mLs Intravenous Stopped 01/12/24 1547)  azithromycin (ZITHROMAX) tablet 500 mg (500 mg Oral Given 01/12/24 1415)  potassium chloride SA (KLOR-CON M) CR tablet 40 mEq (40 mEq Oral Given 01/12/24 1415)  cefTRIAXone (ROCEPHIN) 1 g in sodium chloride 0.9 % 100 mL IVPB (0 g Intravenous Stopped 01/12/24 1744)            Glyn Ade, MD 01/12/24 1809

## 2024-01-13 ENCOUNTER — Telehealth: Payer: Self-pay

## 2024-01-13 NOTE — Transitions of Care (Post Inpatient/ED Visit) (Signed)
   01/13/2024  Name: Jeffrey Ayala MRN: [id number] DOB: 1961/06/13  Today's TOC FU Call Status: Today's TOC FU Call Status:: Unsuccessful Call (1st Attempt) Unsuccessful Call (1st Attempt) Date: 01/13/24  Attempted to reach the patient regarding the most recent Inpatient/ED visit.  Follow Up Plan: Additional outreach attempts will be made to reach the patient to complete the Transitions of Care (Post Inpatient/ED visit) call.   Signature Karena Addison, LPN [facility] Nurse Health Advisor Direct Dial [phone]

## 2024-01-16 ENCOUNTER — Other Ambulatory Visit: Payer: Self-pay | Admitting: Family Medicine

## 2024-01-16 DIAGNOSIS — M109 Gout, unspecified: Secondary | ICD-10-CM

## 2024-01-18 NOTE — Transitions of Care (Post Inpatient/ED Visit) (Signed)
   01/18/2024  Name: Jeffrey Ayala MRN: [id number] DOB: 03-25-1961  Today's TOC FU Call Status: Today's TOC FU Call Status:: Unsuccessful Call (2nd Attempt) Unsuccessful Call (1st Attempt) Date: 01/13/24 Unsuccessful Call (2nd Attempt) Date: 01/18/24  Attempted to reach the patient regarding the most recent Inpatient/ED visit.  Follow Up Plan: Additional outreach attempts will be made to reach the patient to complete the Transitions of Care (Post Inpatient/ED visit) call.   Signature Karena Addison, LPN [facility] Nurse Health Advisor Direct Dial [phone]

## 2024-01-19 ENCOUNTER — Encounter: Payer: Self-pay | Admitting: Family Medicine

## 2024-01-19 ENCOUNTER — Ambulatory Visit (INDEPENDENT_AMBULATORY_CARE_PROVIDER_SITE_OTHER): Payer: BC Managed Care – PPO | Admitting: Family Medicine

## 2024-01-19 VITALS — BP 126/70 | HR 105 | Temp 97.8°F | Ht 70.0 in | Wt 228.5 lb

## 2024-01-19 DIAGNOSIS — R5383 Other fatigue: Secondary | ICD-10-CM | POA: Diagnosis not present

## 2024-01-19 DIAGNOSIS — R051 Acute cough: Secondary | ICD-10-CM

## 2024-01-19 DIAGNOSIS — N184 Chronic kidney disease, stage 4 (severe): Secondary | ICD-10-CM | POA: Diagnosis not present

## 2024-01-19 DIAGNOSIS — J189 Pneumonia, unspecified organism: Secondary | ICD-10-CM | POA: Diagnosis not present

## 2024-01-19 DIAGNOSIS — E876 Hypokalemia: Secondary | ICD-10-CM

## 2024-01-19 LAB — CBC WITH DIFFERENTIAL/PLATELET
Basophils Absolute: 0 10*3/uL (ref 0.0–0.1)
Basophils Relative: 0.4 % (ref 0.0–3.0)
Eosinophils Absolute: 0.1 10*3/uL (ref 0.0–0.7)
Eosinophils Relative: 0.8 % (ref 0.0–5.0)
HCT: 37.8 % — ABNORMAL LOW (ref 39.0–52.0)
Hemoglobin: 12.4 g/dL — ABNORMAL LOW (ref 13.0–17.0)
Lymphocytes Relative: 13.7 % (ref 12.0–46.0)
Lymphs Abs: 1.5 10*3/uL (ref 0.7–4.0)
MCHC: 32.8 g/dL (ref 30.0–36.0)
MCV: 93.9 fL (ref 78.0–100.0)
Monocytes Absolute: 0.7 10*3/uL (ref 0.1–1.0)
Monocytes Relative: 6.6 % (ref 3.0–12.0)
Neutro Abs: 8.8 10*3/uL — ABNORMAL HIGH (ref 1.4–7.7)
Neutrophils Relative %: 78.5 % — ABNORMAL HIGH (ref 43.0–77.0)
Platelets: 355 10*3/uL (ref 150.0–400.0)
RBC: 4.03 Mil/uL — ABNORMAL LOW (ref 4.22–5.81)
RDW: 16.9 % — ABNORMAL HIGH (ref 11.5–15.5)
WBC: 11.2 10*3/uL — ABNORMAL HIGH (ref 4.0–10.5)

## 2024-01-19 LAB — COMPREHENSIVE METABOLIC PANEL
ALT: 16 U/L (ref 0–53)
AST: 12 U/L (ref 0–37)
Albumin: 4.1 g/dL (ref 3.5–5.2)
Alkaline Phosphatase: 88 U/L (ref 39–117)
BUN: 22 mg/dL (ref 6–23)
CO2: 26 meq/L (ref 19–32)
Calcium: 8.8 mg/dL (ref 8.4–10.5)
Chloride: 101 meq/L (ref 96–112)
Creatinine, Ser: 2.1 mg/dL — ABNORMAL HIGH (ref 0.40–1.50)
GFR: 33.03 mL/min — ABNORMAL LOW (ref >60.00)
Glucose, Bld: 94 mg/dL (ref 70–99)
Potassium: 3.7 meq/L (ref 3.5–5.1)
Sodium: 137 meq/L (ref 135–145)
Total Bilirubin: 0.8 mg/dL (ref 0.2–1.2)
Total Protein: 7.1 g/dL (ref 6.0–8.3)

## 2024-01-19 LAB — TSH: TSH: 1.09 u[IU]/mL (ref 0.35–5.50)

## 2024-01-19 NOTE — Progress Notes (Signed)
 Subjective:     Patient ID: Jeffrey Ayala, male    DOB: 1961/04/08, 63 y.o.   MRN: [id number]  Chief Complaint  Patient presents with   Hospitalization Follow-up    Doing a whole lot better since hospital    HPI   History of Present Illness         Here to follow up on multifocal pneumonia. ED visit on 01/12/2024. Reports feeling much improved.   Finished amoxicillin yesterday. He also completed a course of azithromycin.   He continues having fatigue and cough which is improving.   Has appt with his nephrologist and liver specialists.    Health Maintenance Due  Topic Date Due   Zoster Vaccines- Shingrix (1 of 2) Never done   COVID-19 Vaccine (1 - 2024-25 season) Never done    Past Medical History:  Diagnosis Date   A-fib [facility])    Acute nonintractable headache 03/25/2022   AKI (acute kidney injury) ([name])    Anemia    associated with chronic renal disease   Arthritis    Atrial fibrillation, chronic ([name])    not on [facility]   Cardiac arrest ([name]) 02/2022   in the setting of septic shock   Cirrhosis of liver ([name])    Concussion    Dysrhythmia    afib   Eosinophilic esophagitis    EGD/bx by Dr. 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 OR;  Service: Vascular;  Laterality:  Left;   BIOPSY  10/26/2022   Procedure: BIOPSY;  Surgeon: Benancio Deeds, MD;  Location: [facility] ENDOSCOPY;  Service: Gastroenterology;;   CARDIOVERSION N/A 11/20/2022   Procedure: CARDIOVERSION;  Surgeon: Meriam Sprague, MD;  Location: [facility] ENDOSCOPY;  Service: Cardiovascular;  Laterality: N/A;   COLONOSCOPY WITH PROPOFOL N/A 10/26/2022   Procedure: COLONOSCOPY WITH PROPOFOL;  Surgeon: Benancio Deeds, MD;  Location: [facility] ENDOSCOPY;  Service: Gastroenterology;  Laterality: N/A;   CYSTOSCOPY     CYSTOSCOPY WITH STENT PLACEMENT Bilateral 08/15/2022   Procedure: CYSTOSCOPY WITH STENT PLACEMENT;  Surgeon: Belva Agee, MD;  Location: [facility] OR;  Service: Urology;  Laterality: Bilateral;   CYSTOSCOPY/URETEROSCOPY/HOLMIUM LASER/STENT PLACEMENT Bilateral 09/14/2022   Procedure: CYSTOSCOPY/URETEROSCOPY/HOLMIUM LASER/STENT PLACEMENT;  Surgeon: Heloise Purpura, MD;  Location: [facility] ORS;  Service: Urology;  Laterality: Bilateral;   DENTAL SURGERY     ESOPHAGOGASTRODUODENOSCOPY (EGD) WITH PROPOFOL N/A 10/26/2022   Procedure: ESOPHAGOGASTRODUODENOSCOPY (EGD) WITH PROPOFOL;  Surgeon: Benancio Deeds, MD;  Location: [facility] ENDOSCOPY;  Service: Gastroenterology;  Laterality: N/A;   FINGER SURGERY     HERNIA REPAIR     at age 65   IR FLUORO GUIDE CV LINE RIGHT  03/09/2022   IR PATIENT EVAL TECH 0-60 MINS  03/09/2022  IR US GUIDE VASC ACCESS RIGHT  03/09/2022   KNEE ARTHROSCOPY Left    LITHOTRIPSY     NEPHROLITHOTOMY     POLYPECTOMY  10/26/2022   Procedure: POLYPECTOMY;  Surgeon: Benancio Deeds, MD;  Location: [facility] ENDOSCOPY;  Service: Gastroenterology;;   Karen Kays EXPLORATION N/A 03/16/2022   Procedure: IRRIGATION AND DEBRIDEMENT SCROTUM;  Surgeon: Heloise Purpura, MD;  Location: [facility] OR;  Service: Urology;  Laterality: N/A;   SCROTAL EXPLORATION N/A 03/28/2022   Procedure: IRRIGATION AND DEBRIDEMENT SCROTUM;  Surgeon: Crist Fat, MD;  Location: [facility] OR;  Service: Urology;  Laterality: N/A;    TONSILLECTOMY     TOTAL KNEE ARTHROPLASTY Bilateral 11/04/2018   Procedure: BILATERAL TOTAL KNEE ARTHROPLASTY;  Surgeon: Nadara Mustard, MD;  Location: [facility] OR;  Service: Orthopedics;  Laterality: Bilateral;  spinal/epidural per anesthesiologist   TRANSMETATARSAL AMPUTATION Left 03/15/2022   Procedure: LEFT TRANSMETATARSAL AMPUTATION;  Surgeon: Nadara Mustard, MD;  Location: [facility] OR;  Service: Orthopedics;  Laterality: Left;    Family History  Problem Relation Age of Onset   Arrhythmia Mother    COPD Mother    Diabetes Mother    Kidney disease Mother    Gout Father    Kidney disease Father    Sarcoidosis Brother    Cancer Brother    Stomach cancer Neg Hx    Pancreatic cancer Neg Hx    Esophageal cancer Neg Hx    Colon cancer Neg Hx    Rectal cancer Neg Hx     Social History   Socioeconomic History   Marital status: Married    Spouse name: Olegario Messier   Number of children: 2   Years of education: Not on file   Highest education level: Associate degree: occupational, Scientist, product/process development, or vocational program  Occupational History   Occupation: Airline pilot - Chief of Staff  Tobacco Use   Smoking status: Never   Smokeless tobacco: Never  Vaping Use   Vaping status: Never Used  Substance and Sexual Activity   Alcohol use: Not Currently   Drug use: No   Sexual activity: Not Currently  Other Topics Concern   Not on file  Social History Narrative   Not on file   Social Drivers of Health   Financial Resource Strain: Low Risk  (11/09/2023)   Overall Financial Resource Strain (CARDIA)    Difficulty of Paying Living Expenses: Not hard at all  Food Insecurity: No Food Insecurity (11/09/2023)   Hunger Vital Sign    Worried About Running Out of Food in the Last Year: Never true    Ran Out of Food in the Last Year: Never true  Transportation Needs: No Transportation Needs (11/09/2023)   PRAPARE - Administrator, Civil Service (Medical): No    Lack of Transportation (Non-Medical):  No  Physical Activity: Unknown (11/09/2023)   Exercise Vital Sign    Days of Exercise per Week: 0 days    Minutes of Exercise per Session: Not on file  Stress: No Stress Concern Present (11/09/2023)   [employer] of Occupational Health - Occupational Stress Questionnaire    Feeling of Stress : Not at all  Social Connections: Moderately Integrated (11/09/2023)   Social Connection and Isolation Panel [NHANES]    Frequency of Communication with Friends and Family: Three times a week    Frequency of Social Gatherings with Friends and Family: More than three times a week    Attends Religious Services: More than 4 times per year    Active  Member of Clubs or Organizations: No    Attends Engineer, structural: Not on file    Marital Status: Married  Catering manager Violence: Not on file    Outpatient Medications Prior to Visit  Medication Sig Dispense Refill   acetaminophen (TYLENOL) 500 MG tablet Take 1,000 mg by mouth every 6 (six) hours as needed for moderate pain or headache. Rapid Release     albuterol (VENTOLIN HFA) 108 (90 Base) MCG/ACT inhaler Inhale 2 puffs into the lungs every 6 (six) hours as needed for wheezing or shortness of breath. 34 g 0   allopurinol (ZYLOPRIM) 100 MG tablet TAKE 2 TABLETS BY MOUTH EVERY DAY 60 tablet 2   apixaban (ELIQUIS) 5 MG TABS tablet Take 1 tablet (5 mg total) by mouth 2 (two) times daily. 60 tablet 5   diphenhydramine-acetaminophen (TYLENOL PM) 25-500 MG TABS tablet Take 2 tablets by mouth at bedtime as needed (sleep).     furosemide (LASIX) 40 MG tablet Take 40 mg by mouth daily.     gabapentin (NEURONTIN) 100 MG capsule TAKE 1 CAPSULE (100 MG TOTAL) BY MOUTH 3 TIMES A DAY 90 capsule 3   meclizine (ANTIVERT) 25 MG tablet Take 1 tablet (25 mg total) by mouth 2 (two) times daily as needed for dizziness. 30 tablet 0   mupirocin ointment (BACTROBAN) 2 % APPLY TO AFFECTED AREA TWICE A DAY 22 g 0   ondansetron (ZOFRAN) 4 MG tablet Take 1  tablet (4 mg total) by mouth every 8 (eight) hours as needed for nausea or vomiting. 20 tablet 0   pantoprazole (PROTONIX) 40 MG tablet TAKE 1 TABLET BY MOUTH TWICE A DAY 180 tablet 3   Potassium Chloride ER 20 MEQ TBCR Take 1 tablet by mouth daily.     promethazine (PHENERGAN) 6.25 MG/5ML solution Take 5 mLs (6.25 mg total) by mouth every 6 (six) hours as needed for nausea or vomiting. 120 mL 0   traZODone (DESYREL) 50 MG tablet TAKE 1/2 OR 1 TABLET BY MOUTH AT BEDTIME AS NEEDED FOR SLEEP 90 tablet 1   triamcinolone (KENALOG) 0.025 % ointment APPLY TO AFFECTED AREA TWICE A DAY 30 g 0   benzonatate (TESSALON) 200 MG capsule Take 1 capsule (200 mg total) by mouth 2 (two) times daily as needed for cough. (Patient not taking: Reported on 01/19/2024) 20 capsule 0   oseltamivir (TAMIFLU) 30 MG capsule Take 1 capsule (30 mg total) by mouth daily. (Patient not taking: Reported on 01/19/2024) 5 capsule 0   No facility-administered medications prior to visit.    Allergies  Allergen Reactions   Codeine Hives    Insomnia, jittery   Flomax [Tamsulosin Hcl] Nausea Only    Nausea and fatigue    Review of Systems  Constitutional:  Positive for malaise/fatigue. Negative for chills and fever.  HENT:  Negative for congestion and sore throat.   Respiratory:  Positive for cough. Negative for shortness of breath and wheezing.   Cardiovascular:  Negative for chest pain, palpitations and leg swelling.  Gastrointestinal:  Negative for abdominal pain, constipation, diarrhea, nausea and vomiting.  Genitourinary:  Negative for dysuria, frequency and urgency.  Musculoskeletal:  Positive for myalgias.  Neurological:  Negative for dizziness, tingling, focal weakness and headaches.       Objective:    Physical Exam Constitutional:      General: He is not in acute distress.    Appearance: He is not ill-appearing.  HENT:     Mouth/Throat:  Mouth: Mucous membranes are moist.  Eyes:     Extraocular  Movements: Extraocular movements intact.     Conjunctiva/sclera: Conjunctivae normal.  Cardiovascular:     Rate and Rhythm: Normal rate and regular rhythm.  Pulmonary:     Effort: Pulmonary effort is normal.     Breath sounds: Normal breath sounds.  Musculoskeletal:     Cervical back: Normal range of motion and neck supple.  Skin:    General: Skin is warm and dry.  Neurological:     General: No focal deficit present.     Mental Status: He is alert and oriented to person, place, and time.  Psychiatric:        Mood and Affect: Mood normal.        Behavior: Behavior normal.        Thought Content: Thought content normal.      BP 126/70 (BP Location: Right Arm, Patient Position: Sitting)   Pulse (!) 105   Temp 97.8 F (36.6 C) (Temporal)   Ht 5\' 10"  (1.778 m)   Wt 228 lb 8 oz (103.6 kg)   SpO2 98%   BMI 32.79 kg/m  Wt Readings from Last 3 Encounters:  01/19/24 228 lb 8 oz (103.6 kg)  01/11/24 229 lb (103.9 kg)  01/05/24 241 lb (109.3 kg)       Assessment & Plan:   Problem List Items Addressed This Visit     Acute cough   Relevant Orders   CBC with Differential/Platelet (Completed)   CKD (chronic kidney disease) stage 4, GFR 15-29 ml/min ([name])   Relevant Orders   Comprehensive metabolic panel (Completed)   Hypokalemia   Relevant Orders   Comprehensive metabolic panel (Completed)   Multifocal pneumonia - Primary   Relevant Orders   CBC with Differential/Platelet (Completed)   Comprehensive metabolic panel (Completed)   Other Visit Diagnoses       Fatigue, unspecified type       Relevant Orders   CBC with Differential/Platelet (Completed)   Comprehensive metabolic panel (Completed)   TSH (Completed)      Reviewed notes from previous visits here and ED visit.  Influenza diagnosis on 01/05/2024.  Pneumonia diagnosed on 01/11/2024. He was evaluated in the ED for possible sepsis. Completed course of azithromycin and Amoxicillin and reports feeling  significantly improved.  Recheck labs. Follow up if any new or worsening symptoms.   I have discontinued Hennie Duos. Sabo "Chuck"'s oseltamivir and benzonatate. I am also having him maintain his diphenhydramine-acetaminophen, furosemide, acetaminophen, Potassium Chloride ER, pantoprazole, gabapentin, apixaban, triamcinolone, mupirocin ointment, traZODone, ondansetron, meclizine, promethazine, albuterol, and allopurinol.  No orders of the defined types were placed in this encounter.

## 2024-01-19 NOTE — Patient Instructions (Signed)
 Please go downstairs for labs before you leave.  You should gradually continue to improve.  Let me know if you have any new or worsening symptoms.  I will be in touch with your lab results.

## 2024-01-21 ENCOUNTER — Ambulatory Visit: Payer: BC Managed Care – PPO | Admitting: Family

## 2024-01-21 DIAGNOSIS — Z89511 Acquired absence of right leg below knee: Secondary | ICD-10-CM

## 2024-01-25 ENCOUNTER — Encounter: Payer: Self-pay | Admitting: Family

## 2024-01-25 NOTE — Transitions of Care (Post Inpatient/ED Visit) (Signed)
   01/25/2024  Name: Jeffrey Ayala MRN: [id number] DOB: 1961/05/24  Today's TOC FU Call Status: Today's TOC FU Call Status:: Unsuccessful Call (3rd Attempt) Unsuccessful Call (1st Attempt) Date: 01/13/24 Unsuccessful Call (2nd Attempt) Date: 01/18/24 Unsuccessful Call (3rd Attempt) Date: 01/25/24  Attempted to reach the patient regarding the most recent Inpatient/ED visit.  Follow Up Plan: No further outreach attempts will be made at this time. We have been unable to contact the patient.  Signature Karena Addison, LPN [facility] Nurse Health Advisor Direct Dial [phone]

## 2024-01-25 NOTE — Progress Notes (Signed)
 Office Visit Note   Patient: Jeffrey Ayala           Date of Birth: 1961-09-21           MRN: [id number] Visit Date: 01/21/2024              Requested by: Avanell Shackleton, NP-C [address] [place],  [place] [address] PCP: Avanell Shackleton, NP-C  Chief Complaint  Patient presents with   Right Leg - Follow-up      HPI: The patient is a 63 year old gentleman who is seen for evaluation of his right residual limb he is status post right below-knee amputation.  Reports his current liners are worn out and broken down he is in need of supplies for his prosthesis  Patient is an existing right transtibial  amputee.  Patient's current comorbidities are not expected to impact the ability to function with the prescribed prosthesis. Patient verbally communicates a strong desire to use a prosthesis. Patient currently requires mobility aids to ambulate without a prosthesis.  Expects not to use mobility aids with a new prosthesis.  Patient is a K3 level ambulator that spends a lot of time walking around on uneven terrain over obstacles, up and down stairs, and ambulates with a variable cadence.   Assessment & Plan: Visit Diagnoses: No diagnosis found.  Plan: Given an order for his prosthesis supplies and liners follow-up with [place] as needed.  Follow-Up Instructions: No follow-ups on file.   Ortho Exam  Patient is alert, oriented, no adenopathy, well-dressed, normal affect, normal respiratory effort. On examination right residual limb this is well consolidated well-healed there is no ulcers no callus no impending skin breakdown  Imaging: No results found. No images are attached to the encounter.  Labs: Lab Results  Component Value Date   HGBA1C 5.5 02/23/2022   ESRSEDRATE 20 (H) 02/22/2022   CRP 13.8 (H) 02/23/2022   LABURIC 7.7 09/02/2023   LABURIC 9.0 (H) 03/05/2023   LABURIC 8.5 (H) 05/27/2022   REPTSTATUS 04/05/2022 FINAL 03/16/2022   GRAMSTAIN  03/16/2022    NO WBC  SEEN ABUNDANT GRAM POSITIVE COCCI FEW GRAM NEGATIVE RODS    CULT  03/16/2022    ABUNDANT ENTEROCOCCUS FAECALIS FEW PSEUDOMONAS AERUGINOSA MULTI-DRUG RESISTANT ORGANISM ABUNDANT BACTEROIDES OVATUS BETA LACTAMASE POSITIVE CRITICAL RESULT CALLED TO, READ BACK BY AND VERIFIED WITH: PHARMD K PIERCE [id number] AT 1019 BY CM Sent to [facility] for further susceptibility testing. SEE REPORT IN EPIC Performed at [facility], [address], [place], [place] [address]    [facility] ENTEROCOCCUS FAECALIS 03/16/2022   LABORGA PSEUDOMONAS AERUGINOSA 03/16/2022     Lab Results  Component Value Date   ALBUMIN 4.1 01/19/2024   ALBUMIN 3.2 (L) 01/12/2024   ALBUMIN 4.1 01/11/2024    Lab Results  Component Value Date   MG 1.9 04/18/2022   MG 2.1 04/01/2022   MG 2.0 03/31/2022   No results found for: "VD25OH"  No results found for: "PREALBUMIN"    Latest Ref Rng & Units 01/19/2024    3:58 PM 01/12/2024    9:57 AM 01/11/2024    3:58 PM  CBC EXTENDED  WBC 4.0 - 10.5 K/uL 11.2  14.2  14.0   RBC 4.22 - 5.81 Mil/uL 4.03  3.94  4.02   Hemoglobin 13.0 - 17.0 g/dL 36.6  44.0  34.7   HCT 39.0 - 52.0 % 37.8  36.9  37.4   Platelets 150.0 - 400.0 K/uL 355.0  311  313.0  NEUT# 1.4 - 7.7 K/uL 8.8  12.6  11.4   Lymph# 0.7 - 4.0 K/uL 1.5  0.6  1.3      There is no height or weight on file to calculate BMI.  Orders:  No orders of the defined types were placed in this encounter.  No orders of the defined types were placed in this encounter.    Procedures: No procedures performed  Clinical Data: No additional findings.  ROS:  All other systems negative, except as noted in the HPI. Review of Systems  Objective: Vital Signs: There were no vitals taken for this visit.  Specialty Comments:  No specialty comments available.  PMFS History: Patient Active Problem List   Diagnosis Date Noted   Multifocal pneumonia 01/19/2024   CKD (chronic kidney disease) stage 4, GFR 15-29 ml/min ([name])  01/12/2024   Dyslipidemia 09/02/2023   Pruritic rash 03/05/2023   Left foot pain 03/05/2023   OSA (obstructive sleep apnea) 11/08/2022   Benign neoplasm of colon 10/26/2022   Gastric polyp 10/26/2022   Suspected sleep apnea 10/01/2022   Daytime somnolence 10/01/2022   Snoring 10/01/2022   Acute bilateral obstructive uropathy 08/15/2022   Skin breakdown 07/29/2022   Suicidal ideations 06/09/2022   Chills (without fever) 06/09/2022   Severe depression ([name]) 06/09/2022   Acute cough 06/09/2022   End stage renal disease ([name]) 06/02/2022   Reactive depression 05/31/2022   Belching 05/27/2022   Gastroesophageal reflux disease 05/27/2022   Atrial fibrillation ([name]) 05/20/2022   Long term (current) use of anticoagulants 05/20/2022   S/P transmetatarsal amputation of foot, left ([name]) 04/18/2022   CKD (chronic kidney disease) stage 5, GFR less than 15 ml/min ([name]) 04/18/2022   Secondary hyperparathyroidism of renal origin ([name]) 04/10/2022   Pruritus, unspecified 04/10/2022   Nausea 04/10/2022   Iron deficiency anemia, unspecified 04/10/2022   Hypothyroidism, unspecified 04/10/2022   Other acute kidney failure ([name]) 04/10/2022   Dyspnea, unspecified 04/10/2022   Eosinophilic esophagitis 04/10/2022   Gastric ulcer, unspecified as acute or chronic, without hemorrhage or perforation 04/10/2022   Coagulation defect, unspecified ([name]) 04/10/2022   Allergy, unspecified, initial encounter 04/10/2022   Anaphylactic shock, unspecified, initial encounter 04/10/2022   Arthralgia of right lower leg    Acute blood loss anemia    Unilateral complete BKA, right, initial encounter ([name]) 04/03/2022   S/P BKA (below knee amputation), right ([name]) 04/03/2022   Old myocardial infarction 04/02/2022   Dependence on renal dialysis ([name]) 04/02/2022   Acquired absence of right leg below knee ([name]) 04/02/2022   Cirrhosis ([name]) 03/31/2022   Fournier's gangrene in male    Scrotal infection 03/25/2022   Anemia  03/25/2022   Diarrhea 03/25/2022   Shock liver 03/25/2022   Acute respiratory failure with hypoxia ([name]) 03/25/2022   Gout 03/25/2022   Acute nonintractable headache 03/25/2022   Demand ischemia ([name]) 03/25/2022   Thrombocytopenia ([name]) 03/25/2022   Obesity, Class III, BMI 40-49.9 (morbid obesity) ([name]) 03/25/2022   Cardiac arrest ([name]) 03/25/2022   Hypotension    Respiratory insufficiency    Cellulitis    Malnutrition of moderate degree 03/12/2022   Altered mental status    Gangrene of right foot ([name])    Gangrene of left foot ([name])    Severe protein-calorie malnutrition ([name]) 03/10/2022   Colon cancer screening    Pressure injury of skin 02/23/2022   Septic shock ([name]) 02/22/2022   AKI (acute kidney injury) ([name])    Generalized abdominal pain    Lactic acidosis  Atrial fibrillation with RVR ([name])    Hypokalemia    Hypomagnesemia    Pain in finger of right hand 08/14/2021   Degenerative joint disease of shoulder region 05/07/2020   Olecranon bursitis of right elbow 05/07/2020   Chronic right shoulder pain 04/26/2020   S/P TKR (total knee replacement), bilateral 11/04/2018   Bilateral primary osteoarthritis of knee    DDD (degenerative disc disease), cervical 01/10/2018   Kidney stones 06/22/2016   Essential hypertension 08/01/2010   Dysthymic disorder 07/08/2010   PALPITATIONS 07/08/2010   Past Medical History:  Diagnosis Date   A-fib [facility])    Acute nonintractable headache 03/25/2022   AKI (acute kidney injury) ([name])    Anemia    associated with chronic renal disease   Arthritis    Atrial fibrillation, chronic ([name])    not on [facility]   Cardiac arrest ([name]) 02/2022   in the setting of septic shock   Cirrhosis of liver ([name])    Concussion    Dysrhythmia    afib   Eosinophilic esophagitis    EGD/bx by Dr. 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   Type 2 diabetes mellitus with stage 4 chronic kidney disease ([name])     Family History  Problem Relation Age of Onset   Arrhythmia Mother    COPD Mother    Diabetes Mother    Kidney disease Mother    Gout Father    Kidney disease Father    Sarcoidosis Brother    Cancer Brother    Stomach cancer Neg Hx    Pancreatic cancer Neg Hx    Esophageal cancer Neg Hx    Colon cancer Neg Hx    Rectal cancer Neg Hx     Past Surgical History:  Procedure Laterality Date   AMPUTATION Right 03/15/2022   Procedure: RIGHT BELOW KNEE AMPUTATION;  Surgeon: Nadara Mustard, MD;  Location: [facility] OR;  Service: Orthopedics;  Laterality: Right;   AV FISTULA PLACEMENT Left 08/28/2022   Procedure: LEFT ARM ARTERIOVENOUS (AV) FISTULA CREATION;  Surgeon: Maeola Harman, MD;  Location: [facility] OR;  Service: Vascular;  Laterality: Left;   BIOPSY  10/26/2022   Procedure: BIOPSY;  Surgeon: Benancio Deeds, MD;  Location: [facility] ENDOSCOPY;  Service: Gastroenterology;;   CARDIOVERSION N/A 11/20/2022   Procedure: CARDIOVERSION;  Surgeon: Meriam Sprague, MD;  Location: [facility] ENDOSCOPY;  Service: Cardiovascular;  Laterality: N/A;   COLONOSCOPY WITH PROPOFOL N/A 10/26/2022   Procedure: COLONOSCOPY WITH PROPOFOL;  Surgeon: Benancio Deeds, MD;  Location: [facility] ENDOSCOPY;  Service: Gastroenterology;  Laterality: N/A;   CYSTOSCOPY     CYSTOSCOPY WITH STENT PLACEMENT Bilateral 08/15/2022   Procedure: CYSTOSCOPY WITH STENT PLACEMENT;  Surgeon: Belva Agee, MD;  Location: [facility] OR;  Service: Urology;  Laterality: Bilateral;   CYSTOSCOPY/URETEROSCOPY/HOLMIUM LASER/STENT PLACEMENT Bilateral 09/14/2022   Procedure: CYSTOSCOPY/URETEROSCOPY/HOLMIUM LASER/STENT PLACEMENT;  Surgeon: Heloise Purpura, MD;  Location: [facility] ORS;  Service: Urology;  Laterality: Bilateral;   DENTAL  SURGERY     ESOPHAGOGASTRODUODENOSCOPY (EGD) WITH PROPOFOL N/A 10/26/2022   Procedure: ESOPHAGOGASTRODUODENOSCOPY (EGD) WITH PROPOFOL;  Surgeon: Benancio Deeds, MD;  Location: [facility] ENDOSCOPY;  Service: Gastroenterology;  Laterality: N/A;  FINGER SURGERY     HERNIA REPAIR     at age 58   IR FLUORO GUIDE CV LINE RIGHT  03/09/2022   IR PATIENT EVAL TECH 0-60 MINS  03/09/2022   IR US GUIDE VASC ACCESS RIGHT  03/09/2022   KNEE ARTHROSCOPY Left    LITHOTRIPSY     NEPHROLITHOTOMY     POLYPECTOMY  10/26/2022   Procedure: POLYPECTOMY;  Surgeon: Benancio Deeds, MD;  Location: [facility];  Service: Gastroenterology;;   Karen Kays EXPLORATION N/A 03/16/2022   Procedure: IRRIGATION AND DEBRIDEMENT SCROTUM;  Surgeon: Heloise Purpura, MD;  Location: [facility] OR;  Service: Urology;  Laterality: N/A;   SCROTAL EXPLORATION N/A 03/28/2022   Procedure: IRRIGATION AND DEBRIDEMENT SCROTUM;  Surgeon: Crist Fat, MD;  Location: [facility] OR;  Service: Urology;  Laterality: N/A;   TONSILLECTOMY     TOTAL KNEE ARTHROPLASTY Bilateral 11/04/2018   Procedure: BILATERAL TOTAL KNEE ARTHROPLASTY;  Surgeon: Nadara Mustard, MD;  Location: [facility] OR;  Service: Orthopedics;  Laterality: Bilateral;  spinal/epidural per anesthesiologist   TRANSMETATARSAL AMPUTATION Left 03/15/2022   Procedure: LEFT TRANSMETATARSAL AMPUTATION;  Surgeon: Nadara Mustard, MD;  Location: [facility] OR;  Service: Orthopedics;  Laterality: Left;   Social History   Occupational History   Occupation: Physiological scientist  Tobacco Use   Smoking status: Never   Smokeless tobacco: Never  Vaping Use   Vaping status: Never Used  Substance and Sexual Activity   Alcohol use: Not Currently   Drug use: No   Sexual activity: Not Currently

## 2024-02-04 ENCOUNTER — Ambulatory Visit (INDEPENDENT_AMBULATORY_CARE_PROVIDER_SITE_OTHER): Payer: BC Managed Care – PPO | Admitting: Orthopedic Surgery

## 2024-02-04 ENCOUNTER — Other Ambulatory Visit (INDEPENDENT_AMBULATORY_CARE_PROVIDER_SITE_OTHER): Payer: Self-pay

## 2024-02-04 ENCOUNTER — Encounter: Payer: Self-pay | Admitting: Orthopedic Surgery

## 2024-02-04 ENCOUNTER — Telehealth: Payer: Self-pay | Admitting: Orthopedic Surgery

## 2024-02-04 DIAGNOSIS — M25511 Pain in right shoulder: Secondary | ICD-10-CM | POA: Diagnosis not present

## 2024-02-04 MED ORDER — TRAMADOL HCL 50 MG PO TABS: ORAL_TABLET | ORAL | 0 refills | Status: DC

## 2024-02-04 NOTE — Telephone Encounter (Signed)
 Patient is scheduled at [facility] 02/10/24.  I called to offer dates for the right shoulder replacement in late April.  Patient states he prefers to wait awhile.  He would prefer to know what the scan shows first before proceeding with surgery.  .  Mentions having a camper and not being able to get up in the camper with the right arm, therefore maybe waiting later in the year to have this procedure.    Patient will reach out to [place] when ready.

## 2024-02-04 NOTE — Progress Notes (Signed)
 Office Visit Note   Patient: Jeffrey Ayala           Date of Birth: Jan 16, 1961           MRN: [id number] Visit Date: 02/04/2024 Requested by: Avanell Shackleton, NP-C [id number] [place],  [place] [address] PCP: Avanell Shackleton, NP-C  Subjective: Chief Complaint  Patient presents with   Right Shoulder - Pain    HPI: Jeffrey Ayala is a 63 y.o. male who presents to the office reporting right shoulder pain.  This has been going on for 4 to 5 years.  Prior treatment at [facility].  He is right-hand dominant.  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  Pain does not wake him from sleep at night but after he does anything mildly physical such as working in the yard he does have pain that night to some degree as well as the following day.  Denies any cervical spine symptoms or radicular pain.  No prior right shoulder surgery.  Patient did have sepsis in 2022 with right below-knee amputation and left partial foot amputation.  This was done by [name].  He is well pleased with [name] as his orthopedic physician dealing with multiple complex problems.  Patient has a history of atrial fibrillation and takes Eliquis starting 1 year ago.  Also has sleep apnea.  Left shoulder has no issues.  He has a history of injections in the right shoulder which did not help.  He does use his arms when getting up from a seated position because of his prosthesis and the balance issues created by not having any toes.  Does take tramadol for symptoms..                ROS: All systems reviewed are negative as they relate to the chief complaint within the history of present illness.  Patient denies fevers or chills.  Assessment & Plan: Visit Diagnoses:  1. Right shoulder pain, unspecified chronicity     Plan: Impression is right shoulder arthritis which is severe.  Range of motion is limited and there is some posterior erosion.  Virl Diamond does require a lot of use of his arms for mobilization  from seated position.  Plan at this time is thin cut CT scan for patient specific instrumentation for what is likely going to be reverse shoulder replacement.  We will refill tramadol as well.  The risk and benefits of reverse shoulder replacement are discussed with the patient including not limited to infection or vessel damage incomplete pain relief as well as dislocation and shoulder stiffness.  Patient understands risk and benefits and wishes to proceed.  All questions answered.  He will not need return visit after CT scan.  Follow-Up Instructions: No follow-ups on file.   Orders:  Orders Placed This Encounter  Procedures   XR Shoulder Right   CT SHOULDER RIGHT WO CONTRAST   Meds ordered this encounter  Medications   traMADol (ULTRAM) 50 MG tablet    Sig: 1 po q hs prn pain    Dispense:  30 tablet    Refill:  0      Procedures: No procedures performed   Clinical Data: No additional findings.  Objective: Vital Signs: There were no vitals taken for this visit.  Physical Exam:  Constitutional: Patient appears well-developed HEENT:  Head: Normocephalic Eyes:EOM are normal Neck: Normal range of motion Cardiovascular: Normal rate Pulmonary/chest: Effort normal Neurologic: Patient is alert Skin:  Skin is warm Psychiatric: Patient has normal mood and affect  Ortho Exam: Ortho exam demonstrates full active and passive range of motion of the cervical spine.  Right shoulder range of motion 30/80/120 left shoulder range of motion 55/100/160.  Rotator cuff strength is pretty reasonable to infraspinatus supraspinatus and subscap muscle testing but he does report weakness at times with lifting of the right arm.  Motor or sensory function of both hands intact.  Radial pulse intact.  Deltoid fires nicely in both shoulders.  Does have some crepitus on the right with passive range of motion but not on the left.  No discrete AC joint tenderness is present.  Radiographs do show severe  end-stage right shoulder arthritis.  Specialty Comments:  No specialty comments available.  Imaging: XR Shoulder Right Result Date: 02/04/2024 AP axillary outlet radiograph right shoulder reviewed.  Severe end-stage glenohumeral arthritis is present with posterior glenoid erosion.  Acromiohumeral distance slightly diminished.  No acute fracture.  Shoulder is located.  Visualized lung fields clear.    PMFS History: Patient Active Problem List   Diagnosis Date Noted   Multifocal pneumonia 01/19/2024   CKD (chronic kidney disease) stage 4, GFR 15-29 ml/min ([name]) 01/12/2024   Dyslipidemia 09/02/2023   Pruritic rash 03/05/2023   Left foot pain 03/05/2023   OSA (obstructive sleep apnea) 11/08/2022   Benign neoplasm of colon 10/26/2022   Gastric polyp 10/26/2022   Suspected sleep apnea 10/01/2022   Daytime somnolence 10/01/2022   Snoring 10/01/2022   Acute bilateral obstructive uropathy 08/15/2022   Skin breakdown 07/29/2022   Suicidal ideations 06/09/2022   Chills (without fever) 06/09/2022   Severe depression ([name]) 06/09/2022   Acute cough 06/09/2022   End stage renal disease ([name]) 06/02/2022   Reactive depression 05/31/2022   Belching 05/27/2022   Gastroesophageal reflux disease 05/27/2022   Atrial fibrillation ([name]) 05/20/2022   Long term (current) use of anticoagulants 05/20/2022   S/P transmetatarsal amputation of foot, left ([name]) 04/18/2022   CKD (chronic kidney disease) stage 5, GFR less than 15 ml/min ([name]) 04/18/2022   Secondary hyperparathyroidism of renal origin ([name]) 04/10/2022   Pruritus, unspecified 04/10/2022   Nausea 04/10/2022   Iron deficiency anemia, unspecified 04/10/2022   Hypothyroidism, unspecified 04/10/2022   Other acute kidney failure ([name]) 04/10/2022   Dyspnea, unspecified 04/10/2022   Eosinophilic esophagitis 04/10/2022   Gastric ulcer, unspecified as acute or chronic, without hemorrhage or perforation 04/10/2022   Coagulation defect, unspecified  ([name]) 04/10/2022   Allergy, unspecified, initial encounter 04/10/2022   Anaphylactic shock, unspecified, initial encounter 04/10/2022   Arthralgia of right lower leg    Acute blood loss anemia    Unilateral complete BKA, right, initial encounter ([name]) 04/03/2022   S/P BKA (below knee amputation), right ([name]) 04/03/2022   Old myocardial infarction 04/02/2022   Dependence on renal dialysis ([name]) 04/02/2022   Acquired absence of right leg below knee ([name]) 04/02/2022   Cirrhosis ([name]) 03/31/2022   Fournier's gangrene in male    Scrotal infection 03/25/2022   Anemia 03/25/2022   Diarrhea 03/25/2022   Shock liver 03/25/2022   Acute respiratory failure with hypoxia ([name]) 03/25/2022   Gout 03/25/2022   Acute nonintractable headache 03/25/2022   Demand ischemia ([name]) 03/25/2022   Thrombocytopenia ([name]) 03/25/2022   Obesity, Class III, BMI 40-49.9 (morbid obesity) ([name]) 03/25/2022   Cardiac arrest ([name]) 03/25/2022   Hypotension    Respiratory insufficiency    Cellulitis    Malnutrition of moderate degree 03/12/2022   Altered mental status  Gangrene of right foot ([name])    Gangrene of left foot ([name])    Severe protein-calorie malnutrition ([name]) 03/10/2022   Colon cancer screening    Pressure injury of skin 02/23/2022   Septic shock ([name]) 02/22/2022   AKI (acute kidney injury) ([name])    Generalized abdominal pain    Lactic acidosis    Atrial fibrillation with RVR ([name])    Hypokalemia    Hypomagnesemia    Pain in finger of right hand 08/14/2021   Degenerative joint disease of shoulder region 05/07/2020   Olecranon bursitis of right elbow 05/07/2020   Chronic right shoulder pain 04/26/2020   S/P TKR (total knee replacement), bilateral 11/04/2018   Bilateral primary osteoarthritis of knee    DDD (degenerative disc disease), cervical 01/10/2018   Kidney stones 06/22/2016   Essential hypertension 08/01/2010   Dysthymic disorder 07/08/2010   PALPITATIONS 07/08/2010   Past Medical  History:  Diagnosis Date   A-fib [facility])    Acute nonintractable headache 03/25/2022   AKI (acute kidney injury) ([name])    Anemia    associated with chronic renal disease   Arthritis    Atrial fibrillation, chronic ([name])    not on [facility]   Cardiac arrest ([name]) 02/2022   in the setting of septic shock   Cirrhosis of liver ([name])    Concussion    Dysrhythmia    afib   Eosinophilic esophagitis    EGD/bx by Dr. 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   Type 2 diabetes mellitus with stage 4 chronic kidney disease ([name])     Family History  Problem Relation Age of Onset   Arrhythmia Mother    COPD Mother    Diabetes Mother    Kidney disease Mother    Gout Father    Kidney disease Father    Sarcoidosis Brother    Cancer Brother    Stomach cancer Neg Hx    Pancreatic cancer Neg Hx    Esophageal cancer Neg Hx    Colon cancer Neg Hx    Rectal cancer Neg Hx     Past Surgical History:  Procedure Laterality Date   AMPUTATION Right 03/15/2022   Procedure: RIGHT BELOW KNEE AMPUTATION;  Surgeon: Nadara Mustard, MD;  Location: [facility] OR;  Service: Orthopedics;  Laterality: Right;   AV FISTULA PLACEMENT Left 08/28/2022   Procedure: LEFT ARM ARTERIOVENOUS (AV) FISTULA CREATION;  Surgeon: Maeola Harman, MD;  Location: [facility] OR;  Service: Vascular;  Laterality: Left;   BIOPSY  10/26/2022   Procedure: BIOPSY;  Surgeon: Benancio Deeds, MD;  Location: [facility] ENDOSCOPY;  Service: Gastroenterology;;   CARDIOVERSION N/A 11/20/2022   Procedure: CARDIOVERSION;  Surgeon: Meriam Sprague, MD;  Location: [facility] ENDOSCOPY;  Service: Cardiovascular;  Laterality: N/A;   COLONOSCOPY WITH PROPOFOL N/A 10/26/2022   Procedure: COLONOSCOPY WITH  PROPOFOL;  Surgeon: Benancio Deeds, MD;  Location: [facility] ENDOSCOPY;  Service: Gastroenterology;  Laterality: N/A;   CYSTOSCOPY     CYSTOSCOPY WITH STENT PLACEMENT Bilateral 08/15/2022   Procedure: CYSTOSCOPY WITH STENT PLACEMENT;  Surgeon: Belva Agee, MD;  Location: [facility] OR;  Service: Urology;  Laterality: Bilateral;   CYSTOSCOPY/URETEROSCOPY/HOLMIUM LASER/STENT  PLACEMENT Bilateral 09/14/2022   Procedure: CYSTOSCOPY/URETEROSCOPY/HOLMIUM LASER/STENT PLACEMENT;  Surgeon: Heloise Purpura, MD;  Location: [facility] ORS;  Service: Urology;  Laterality: Bilateral;   DENTAL SURGERY     ESOPHAGOGASTRODUODENOSCOPY (EGD) WITH PROPOFOL N/A 10/26/2022   Procedure: ESOPHAGOGASTRODUODENOSCOPY (EGD) WITH PROPOFOL;  Surgeon: Benancio Deeds, MD;  Location: [facility] ENDOSCOPY;  Service: Gastroenterology;  Laterality: N/A;   FINGER SURGERY     HERNIA REPAIR     at age 63   IR FLUORO GUIDE CV LINE RIGHT  03/09/2022   IR PATIENT EVAL TECH 0-60 MINS  03/09/2022   IR US GUIDE VASC ACCESS RIGHT  03/09/2022   KNEE ARTHROSCOPY Left    LITHOTRIPSY     NEPHROLITHOTOMY     POLYPECTOMY  10/26/2022   Procedure: POLYPECTOMY;  Surgeon: Benancio Deeds, MD;  Location: [facility] ENDOSCOPY;  Service: Gastroenterology;;   Karen Kays EXPLORATION N/A 03/16/2022   Procedure: IRRIGATION AND DEBRIDEMENT SCROTUM;  Surgeon: Heloise Purpura, MD;  Location: [facility] OR;  Service: Urology;  Laterality: N/A;   SCROTAL EXPLORATION N/A 03/28/2022   Procedure: IRRIGATION AND DEBRIDEMENT SCROTUM;  Surgeon: Crist Fat, MD;  Location: [facility] OR;  Service: Urology;  Laterality: N/A;   TONSILLECTOMY     TOTAL KNEE ARTHROPLASTY Bilateral 11/04/2018   Procedure: BILATERAL TOTAL KNEE ARTHROPLASTY;  Surgeon: Nadara Mustard, MD;  Location: [facility] OR;  Service: Orthopedics;  Laterality: Bilateral;  spinal/epidural per anesthesiologist   TRANSMETATARSAL AMPUTATION Left 03/15/2022   Procedure: LEFT TRANSMETATARSAL AMPUTATION;  Surgeon: Nadara Mustard, MD;  Location: [facility] OR;   Service: Orthopedics;  Laterality: Left;   Social History   Occupational History   Occupation: Physiological scientist  Tobacco Use   Smoking status: Never   Smokeless tobacco: Never  Vaping Use   Vaping status: Never Used  Substance and Sexual Activity   Alcohol use: Not Currently   Drug use: No   Sexual activity: Not Currently

## 2024-02-08 ENCOUNTER — Encounter: Payer: Self-pay | Admitting: Orthopedic Surgery

## 2024-02-09 ENCOUNTER — Encounter: Payer: Self-pay | Admitting: Orthopedic Surgery

## 2024-02-10 ENCOUNTER — Other Ambulatory Visit

## 2024-02-15 DIAGNOSIS — Z89511 Acquired absence of right leg below knee: Secondary | ICD-10-CM | POA: Diagnosis not present

## 2024-02-23 ENCOUNTER — Encounter: Payer: Self-pay | Admitting: Family

## 2024-02-23 ENCOUNTER — Ambulatory Visit: Admitting: Family

## 2024-02-23 DIAGNOSIS — Z89432 Acquired absence of left foot: Secondary | ICD-10-CM | POA: Diagnosis not present

## 2024-02-23 DIAGNOSIS — M6702 Short Achilles tendon (acquired), left ankle: Secondary | ICD-10-CM | POA: Diagnosis not present

## 2024-02-23 DIAGNOSIS — Z89511 Acquired absence of right leg below knee: Secondary | ICD-10-CM | POA: Diagnosis not present

## 2024-02-23 NOTE — Progress Notes (Addendum)
 Office Visit Note   Patient: Jeffrey Ayala           Date of Birth: 1960/12/26           MRN: [id number] Visit Date: 02/23/2024              Requested by: Lendia Boby CROME, NP-C [address] [place],  [place] [address] PCP: Lendia Boby CROME, NP-C  Chief Complaint  Patient presents with   Right Leg - Follow-up   Left Foot - Follow-up      HPI: The patient is a 63 year old gentleman who presents today for 2 separate issues.  He is status post below-knee amputation on the right his current socket is still fitting he has had several modifications to it and it is still well-fitting.  There is too much space in it due to his volume loss.  He also needs new liners and supplies on the right.  He is also complaining of worsening pain beneath the distal metatarsals on his transmetatarsal amputation on the left.  He currently has a custom orthotic this is over 58 years old he is concerned that it may have worn down some perhaps not getting enough support under his arch no wounds that he is concerned about  Patient is an existing right transtibial  amputee.  Patient's current comorbidities are not expected to impact the ability to function with the prescribed prosthesis. Patient verbally communicates a strong desire to use a prosthesis. Patient currently requires mobility aids to ambulate without a prosthesis.  Expects not to use mobility aids with a new prosthesis.  Patient is a K3 level ambulator that spends a lot of time walking around on uneven terrain over obstacles, up and down stairs, and ambulates with a variable cadence.  The patient has noticed some instability with his current prosthesis.  He cannot stand still for prolonged periods of time.  He has been working with his prosthetist about his best next steps.    He would benefit from socket replacement.   He is currently still working.   Assessment & Plan: Visit Diagnoses: No diagnosis found.  Plan: Given an order for  new socket set up with supplies as well as a orthotic replacement for the left foot with carbon fiber plate  Recommended heel cord stretching on the left  Follow-Up Instructions: No follow-ups on file.   Ortho Exam  Patient is alert, oriented, no adenopathy, well-dressed, normal affect, normal respiratory effort. On examination left transmetatarsal amputation the foot is well-healed there is no hyperkeratotic tissue no dystrophic changes.  He does have some tenderness and sensitivity to the plantar aspect of the metatarsals distally. On examination right residual limb this is well consolidated well-healed there is no impending ulceration  Imaging: No results found. No images are attached to the encounter.  Labs: Lab Results  Component Value Date   HGBA1C 5.5 02/23/2022   ESRSEDRATE 20 (H) 02/22/2022   CRP 13.8 (H) 02/23/2022   LABURIC 7.7 09/02/2023   LABURIC 9.0 (H) 03/05/2023   LABURIC 8.5 (H) 05/27/2022   REPTSTATUS 04/05/2022 FINAL 03/16/2022   GRAMSTAIN  03/16/2022    NO WBC SEEN ABUNDANT GRAM POSITIVE COCCI FEW GRAM NEGATIVE RODS    CULT  03/16/2022    ABUNDANT ENTEROCOCCUS FAECALIS FEW PSEUDOMONAS AERUGINOSA MULTI-DRUG RESISTANT ORGANISM ABUNDANT BACTEROIDES OVATUS BETA LACTAMASE POSITIVE CRITICAL RESULT CALLED TO, READ BACK BY AND VERIFIED WITH: PHARMD K PIERCE [id number] AT 1019 BY CM Sent to [facility] for further susceptibility testing.  SEE REPORT IN EPIC Performed at [facility], [address] [place], [place] [address]    [facility] ENTEROCOCCUS FAECALIS 03/16/2022   LABORGA PSEUDOMONAS AERUGINOSA 03/16/2022     Lab Results  Component Value Date   ALBUMIN  4.1 01/19/2024   ALBUMIN  3.2 (L) 01/12/2024   ALBUMIN  4.1 01/11/2024    Lab Results  Component Value Date   MG 1.9 04/18/2022   MG 2.1 04/01/2022   MG 2.0 03/31/2022   No results found for: VD25OH  No results found for: PREALBUMIN    Latest Ref Rng & Units 01/19/2024    3:58 PM  01/12/2024    9:57 AM 01/11/2024    3:58 PM  CBC EXTENDED  WBC 4.0 - 10.5 K/uL 11.2  14.2  14.0   RBC 4.22 - 5.81 Mil/uL 4.03  3.94  4.02   Hemoglobin 13.0 - 17.0 g/dL 87.5  87.6  87.3   HCT 39.0 - 52.0 % 37.8  36.9  37.4   Platelets 150.0 - 400.0 K/uL 355.0  311  313.0   NEUT# 1.4 - 7.7 K/uL 8.8  12.6  11.4   Lymph# 0.7 - 4.0 K/uL 1.5  0.6  1.3      There is no height or weight on file to calculate BMI.  Orders:  No orders of the defined types were placed in this encounter.  No orders of the defined types were placed in this encounter.    Procedures: No procedures performed  Clinical Data: No additional findings.  ROS:  All other systems negative, except as noted in the HPI. Review of Systems  Objective: Vital Signs: There were no vitals taken for this visit.  Specialty Comments:  No specialty comments available.  PMFS History: Patient Active Problem List   Diagnosis Date Noted   Multifocal pneumonia 01/19/2024   CKD (chronic kidney disease) stage 4, GFR 15-29 ml/min ([name]) 01/12/2024   Dyslipidemia 09/02/2023   Pruritic rash 03/05/2023   Left foot pain 03/05/2023   OSA (obstructive sleep apnea) 11/08/2022   Benign neoplasm of colon 10/26/2022   Gastric polyp 10/26/2022   Suspected sleep apnea 10/01/2022   Daytime somnolence 10/01/2022   Snoring 10/01/2022   Acute bilateral obstructive uropathy 08/15/2022   Skin breakdown 07/29/2022   Suicidal ideations 06/09/2022   Chills (without fever) 06/09/2022   Severe depression ([name]) 06/09/2022   Acute cough 06/09/2022   End stage renal disease ([name]) 06/02/2022   Reactive depression 05/31/2022   Belching 05/27/2022   Gastroesophageal reflux disease 05/27/2022   Atrial fibrillation ([name]) 05/20/2022   Long term (current) use of anticoagulants 05/20/2022   S/P transmetatarsal amputation of foot, left ([name]) 04/18/2022   CKD (chronic kidney disease) stage 5, GFR less than 15 ml/min ([name]) 04/18/2022   Secondary  hyperparathyroidism of renal origin ([name]) 04/10/2022   Pruritus, unspecified 04/10/2022   Nausea 04/10/2022   Iron deficiency anemia, unspecified 04/10/2022   Hypothyroidism, unspecified 04/10/2022   Other acute kidney failure ([name]) 04/10/2022   Dyspnea, unspecified 04/10/2022   Eosinophilic esophagitis 04/10/2022   Gastric ulcer, unspecified as acute or chronic, without hemorrhage or perforation 04/10/2022   Coagulation defect, unspecified ([name]) 04/10/2022   Allergy, unspecified, initial encounter 04/10/2022   Anaphylactic shock, unspecified, initial encounter 04/10/2022   Arthralgia of right lower leg    Acute blood loss anemia    Unilateral complete BKA, right, initial encounter ([name]) 04/03/2022   S/P BKA (below knee amputation), right ([name]) 04/03/2022   Old myocardial infarction 04/02/2022  Dependence on renal dialysis ([name]) 04/02/2022   Acquired absence of right leg below knee ([name]) 04/02/2022   Cirrhosis ([name]) 03/31/2022   Fournier's gangrene in male    Scrotal infection 03/25/2022   Anemia 03/25/2022   Diarrhea 03/25/2022   Shock liver 03/25/2022   Acute respiratory failure with hypoxia ([name]) 03/25/2022   Gout 03/25/2022   Acute nonintractable headache 03/25/2022   Demand ischemia ([name]) 03/25/2022   Thrombocytopenia ([name]) 03/25/2022   Obesity, Class III, BMI 40-49.9 (morbid obesity) ([name]) 03/25/2022   Cardiac arrest ([name]) 03/25/2022   Hypotension    Respiratory insufficiency    Cellulitis    Malnutrition of moderate degree 03/12/2022   Altered mental status    Gangrene of right foot ([name])    Gangrene of left foot ([name])    Severe protein-calorie malnutrition ([name]) 03/10/2022   Colon cancer screening    Pressure injury of skin 02/23/2022   Septic shock ([name]) 02/22/2022   AKI (acute kidney injury) ([name])    Generalized abdominal pain    Lactic acidosis    Atrial fibrillation with RVR ([name])    Hypokalemia    Hypomagnesemia    Pain in finger of right hand 08/14/2021    Degenerative joint disease of shoulder region 05/07/2020   Olecranon bursitis of right elbow 05/07/2020   Chronic right shoulder pain 04/26/2020   S/P TKR (total knee replacement), bilateral 11/04/2018   Bilateral primary osteoarthritis of knee    DDD (degenerative disc disease), cervical 01/10/2018   Kidney stones 06/22/2016   Essential hypertension 08/01/2010   Dysthymic disorder 07/08/2010   PALPITATIONS 07/08/2010   Past Medical History:  Diagnosis Date   A-fib [facility])    Acute nonintractable headache 03/25/2022   AKI (acute kidney injury) ([name])    Anemia    associated with chronic renal disease   Arthritis    Atrial fibrillation, chronic ([name])    not on [facility]   Cardiac arrest ([name]) 02/2022   in the setting of septic shock   Cirrhosis of liver ([name])    Concussion    Dysrhythmia    afib   Eosinophilic esophagitis    EGD/bx by Dr. 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   Type 2 diabetes mellitus with stage 4 chronic kidney disease ([name])     Family History  Problem Relation Age of Onset   Arrhythmia Mother    COPD Mother    Diabetes Mother    Kidney disease Mother    Gout Father    Kidney disease Father    Sarcoidosis Brother    Cancer Brother    Stomach cancer Neg Hx    Pancreatic cancer Neg Hx    Esophageal cancer Neg Hx    Colon cancer Neg Hx    Rectal cancer Neg Hx     Past Surgical History:  Procedure Laterality Date   AMPUTATION Right 03/15/2022   Procedure: RIGHT BELOW KNEE AMPUTATION;  Surgeon: Harden Jerona GAILS, MD;  Location[facility] OR;  Service: Orthopedics;  Laterality: Right;   AV FISTULA PLACEMENT Left 08/28/2022   Procedure: LEFT ARM ARTERIOVENOUS (AV) FISTULA CREATION;  Surgeon: Sheree Penne Bruckner, MD;  Location: [facility] OR;  Service: Vascular;  Laterality: Left;   BIOPSY  10/26/2022   Procedure: BIOPSY;  Surgeon: Leigh Elspeth SQUIBB, MD;  Location: [facility];  Service: Gastroenterology;;   CARDIOVERSION N/A 11/20/2022   Procedure: CARDIOVERSION;  Surgeon: Hobart Powell BRAVO, MD;  [facility]COPY;  Service: Cardiovascular;  Laterality: N/A;   COLONOSCOPY WITH PROPOFOL  N/A 10/26/2022   Procedure: COLONOSCOPY WITH PROPOFOL ;  Surgeon: Leigh Elspeth SQUIBB, MD;  Location: [facility] ENDOSCOPY;  Service: Gastroenterology;  Laterality: N/A;   CYSTOSCOPY     CYSTOSCOPY WITH STENT PLACEMENT Bilateral 08/15/2022   Procedure: CYSTOSCOPY WITH STENT PLACEMENT;  Surgeon: Rosalind Zachary NOVAK, MD;  Location: [facility] OR;  Service: Urology;  Laterality: Bilateral;   CYSTOSCOPY/URETEROSCOPY/HOLMIUM LASER/STENT PLACEMENT Bilateral 09/14/2022   Procedure: CYSTOSCOPY/URETEROSCOPY/HOLMIUM LASER/STENT PLACEMENT;  Surgeon: Renda Glance, MD;  Location: [facility] ORS;  Service: Urology;  Laterality: Bilateral;   DENTAL SURGERY     ESOPHAGOGASTRODUODENOSCOPY (EGD) WITH PROPOFOL  N/A 10/26/2022   Procedure: ESOPHAGOGASTRODUODENOSCOPY (EGD) WITH PROPOFOL ;  Surgeon: Leigh Elspeth SQUIBB, MD;  Location: [facility] ENDOSCOPY;  Service: Gastroenterology;  Laterality: N/A;   FINGER SURGERY     HERNIA REPAIR     at age 23   IR FLUORO GUIDE CV LINE RIGHT  03/09/2022   IR PATIENT EVAL TECH 0-60 MINS  03/09/2022   IR US  GUIDE VASC ACCESS RIGHT  03/09/2022   KNEE ARTHROSCOPY Left    LITHOTRIPSY     NEPHROLITHOTOMY     POLYPECTOMY  10/26/2022   Procedure: POLYPECTOMY;  Surgeon: Leigh Elspeth SQUIBB, MD;  Location: [facility];  Service: Gastroenterology;;   THALIA EXPLORATION N/A 03/16/2022   Procedure: IRRIGATION AND DEBRIDEMENT SCROTUM;  Surgeon: Renda Glance, MD;  Location: [facility] OR;  Service: Urology;  Laterality: N/A;   SCROTAL EXPLORATION N/A 03/28/2022   Procedure: IRRIGATION AND DEBRIDEMENT SCROTUM;  Surgeon: Cam Morene ORN,  MD;  Location: [facility] OR;  Service: Urology;  Laterality: N/A;   TONSILLECTOMY     TOTAL KNEE ARTHROPLASTY Bilateral 11/04/2018   Procedure: BILATERAL TOTAL KNEE ARTHROPLASTY;  Surgeon: Harden Jerona GAILS, MD;  Location: [facility] OR;  Service: Orthopedics;  Laterality: Bilateral;  spinal/epidural per anesthesiologist   TRANSMETATARSAL AMPUTATION Left 03/15/2022   Procedure: LEFT TRANSMETATARSAL AMPUTATION;  Surgeon: Harden Jerona GAILS, MD;  Location: [facility] OR;  Service: Orthopedics;  Laterality: Left;   Social History   Occupational History   Occupation: Physiological scientist  Tobacco Use   Smoking status: Never   Smokeless tobacco: Never  Vaping Use   Vaping status: Never Used  Substance and Sexual Activity   Alcohol use: Not Currently   Drug use: No   Sexual activity: Not Currently

## 2024-02-24 ENCOUNTER — Ambulatory Visit
Admission: RE | Admit: 2024-02-24 | Discharge: 2024-02-24 | Disposition: A | Discharge status: Home / Self Care | Source: Ambulatory Visit | Attending: Orthopedic Surgery | Admitting: Orthopedic Surgery

## 2024-02-24 DIAGNOSIS — M19011 Primary osteoarthritis, right shoulder: Secondary | ICD-10-CM | POA: Diagnosis not present

## 2024-02-24 DIAGNOSIS — M25511 Pain in right shoulder: Secondary | ICD-10-CM

## 2024-03-21 DIAGNOSIS — E1122 Type 2 diabetes mellitus with diabetic chronic kidney disease: Secondary | ICD-10-CM | POA: Diagnosis not present

## 2024-03-21 DIAGNOSIS — N184 Chronic kidney disease, stage 4 (severe): Secondary | ICD-10-CM | POA: Diagnosis not present

## 2024-03-21 DIAGNOSIS — N179 Acute kidney failure, unspecified: Secondary | ICD-10-CM | POA: Diagnosis not present

## 2024-03-21 DIAGNOSIS — I129 Hypertensive chronic kidney disease with stage 1 through stage 4 chronic kidney disease, or unspecified chronic kidney disease: Secondary | ICD-10-CM | POA: Diagnosis not present

## 2024-03-22 NOTE — Progress Notes (Signed)
 Blue sheet done pls post thx

## 2024-03-23 ENCOUNTER — Telehealth: Payer: Self-pay | Admitting: *Deleted

## 2024-03-23 NOTE — Telephone Encounter (Signed)
   Pre-operative Risk Assessment    Patient Name: Jeffrey Ayala  DOB: Oct 16, 1961 MRN: [id number]   Date of last office visit: 07/08/23 DR. O'NEAL Date of next office visit: NONE   Request for Surgical Clearance    Procedure:   RIGHT REVERSE SHOULDER ARTHROPLASTY  Date of Surgery:  Clearance TBD                                Surgeon:  DR. Marykay Snipes Surgeon's Group or Practice Name:  [facility] Phone number:  [phone] Fax number:  [phone]   Type of Clearance Requested:   - Medical  - Pharmacy:  Hold Apixaban  (Eliquis )     Type of Anesthesia:  General    Additional requests/questions:    Princ[place] Broom   03/23/2024, 3:24 PM

## 2024-03-29 ENCOUNTER — Telehealth: Payer: Self-pay | Admitting: *Deleted

## 2024-03-29 NOTE — Telephone Encounter (Signed)
 Patient with diagnosis of afib on Eliquis  for anticoagulation.    Procedure: RIGHT REVERSE SHOULDER ARTHROPLASTY  Date of procedure: TBD   CHA2DS2-VASc Score = 2   This indicates a 2.2% annual risk of stroke. The patient's score is based upon: CHF History: 0 HTN History: 1 Diabetes History: 0 Stroke History: 0 Vascular Disease History: 1 Age Score: 0 Gender Score: 0      CrCl 43 ml/min Platelet count 355  Patient has not had an Afib/aflutter ablation within the last 3 months or DCCV within the last 30 days  Per office protocol, patient can hold Eliquis  for 3 days prior to procedure.    **This guidance is not considered finalized until pre-operative APP has relayed final recommendations.**

## 2024-03-29 NOTE — Telephone Encounter (Signed)
 DPR on file for pt's wife who scheduled tele preop appt for the pt. Tele appt 04/05/24. Pt's wife said surgeon will not schedule surgery until they receive cardiac clearance. Med rec and consent are done.     Patient Consent for Virtual Visit        Jeffrey Ayala has provided verbal consent on 03/29/2024 for a virtual visit (video or telephone).   CONSENT FOR VIRTUAL VISIT FOR:  Jeffrey Ayala  By participating in this virtual visit I agree to the following:  I hereby voluntarily request, consent and authorize [facility] and its employed or contracted physicians, physician assistants, nurse practitioners or other licensed health care professionals (the Practitioner), to provide me with telemedicine health care services (the "Services") as deemed necessary by the treating Practitioner. I acknowledge and consent to receive the Services by the Practitioner via telemedicine. I understand that the telemedicine visit will involve communicating with the Practitioner through live audiovisual communication technology and the disclosure of certain medical information by electronic transmission. I acknowledge that I have been given the opportunity to request an in-person assessment or other available alternative prior to the telemedicine visit and am voluntarily participating in the telemedicine visit.  I understand that I have the right to withhold or withdraw my consent to the use of telemedicine in the course of my care at any time, without affecting my right to future care or treatment, and that the Practitioner or I may terminate the telemedicine visit at any time. I understand that I have the right to inspect all information obtained and/or recorded in the course of the telemedicine visit and may receive copies of available information for a reasonable fee.  I understand that some of the potential risks of receiving the Services via telemedicine include:  Delay or interruption in medical  evaluation due to technological equipment failure or disruption; Information transmitted may not be sufficient (e.g. poor resolution of images) to allow for appropriate medical decision making by the Practitioner; and/or  In rare instances, security protocols could fail, causing a breach of personal health information.  Furthermore, I acknowledge that it is my responsibility to provide information about my medical history, conditions and care that is complete and accurate to the best of my ability. I acknowledge that Practitioner's advice, recommendations, and/or decision may be based on factors not within their control, such as incomplete or inaccurate data provided by me or distortions of diagnostic images or specimens that may result from electronic transmissions. I understand that the practice of medicine is not an exact science and that Practitioner makes no warranties or guarantees regarding treatment outcomes. I acknowledge that a copy of this consent can be made available to me via my patient portal [facility] MyChart), or I can request a printed copy by calling the office of  [facility].    I understand that my insurance will be billed for this visit.   I have read or had this consent read to me. I understand the contents of this consent, which adequately explains the benefits and risks of the Services being provided via telemedicine.  I have been provided ample opportunity to ask questions regarding this consent and the Services and have had my questions answered to my satisfaction. I give my informed consent for the services to be provided through the use of telemedicine in my medical care

## 2024-03-29 NOTE — Telephone Encounter (Signed)
 DPR on file for pt's wife who scheduled tele preop appt for the pt. Tele appt 04/05/24. Pt's wife said surgeon will not schedule surgery until they receive cardiac clearance. Med rec and consent are done.

## 2024-03-29 NOTE — Telephone Encounter (Signed)
   Name: Jeffrey Ayala  DOB: 12-Sep-1961  MRN: [id number]  Primary Cardiologist: Oneil Bigness, MD  Preoperative team, please contact this patient and set up a phone call appointment for further preoperative risk assessment. Please obtain consent and complete medication review. Thank you for your help.  I confirm that guidance regarding antiplatelet and oral anticoagulation therapy has been completed and, if necessary, noted below.  Per office protocol, patient can hold Eliquis  for 3 days prior to procedure.    I also confirmed the patient resides in the state of [place] . As per [facility] Medical Board telemedicine laws, the patient must reside in the state in which the provider is licensed.   Nedda Gains D Johnavon Mcclafferty, NP 03/29/2024, 4:40 PM [facility]

## 2024-04-05 ENCOUNTER — Ambulatory Visit: Attending: Cardiology | Admitting: Emergency Medicine

## 2024-04-05 DIAGNOSIS — Z01810 Encounter for preprocedural cardiovascular examination: Secondary | ICD-10-CM | POA: Diagnosis not present

## 2024-04-05 NOTE — Progress Notes (Signed)
 Virtual Visit via Telephone Note   Because of MONTAVIS PRALL co-morbid illnesses, he is at least at moderate risk for complications without adequate follow up.  This format is felt to be most appropriate for this patient at this time.  Due to technical limitations with video connection Web designer), today's appointment will be conducted as an audio only telehealth visit, and PHINN STAUFF verbally agreed to proceed in this manner.   All issues noted in this document were discussed and addressed.  No physical exam could be performed with this format.  Evaluation Performed:  Preoperative cardiovascular risk assessment _____________   Date:  04/05/2024   Patient ID:  Jeffrey Ayala, DOB 20-Jul-1961, MRN [id number] Patient Location:  Home Provider location:   Office  Primary Care Provider:  Abram Abraham, NP-C Primary Cardiologist:  Oneil Bigness, MD  Chief Complaint / Patient Profile   63 y.o. y/o male with a h/o cardiac arrest 2/2 septic shock, AKI previously on HD, LE gangrene s/p left transmetatarsal amputation and R BKA, paroxysmal atrial fibrillation, anemia who is pending right reverse shoulder arthroplasty with Orth[facility] care at [facility] by Dr. Rozelle Corning on date TBD and presents today for telephonic preoperative cardiovascular risk assessment.  History of Present Illness    Jeffrey Ayala is a 63 y.o. male who presents via audio/video conferencing for a telehealth visit today.  Pt was last seen in cardiology clinic on 07/08/2023 by Dr. Rolm Clos.  At that time Burnis Carver was doing well.  The patient is now pending procedure as outlined above. Since his last visit, he denies chest pain, shortness of breath, lower extremity edema, fatigue, palpitations, melena, hematuria, hemoptysis, diaphoresis, weakness, presyncope, syncope, orthopnea, and PND.  Today patient is doing well overall.  He is without any acute cardiovascular concerns or complaints.  He does have history of R BKA  and left transmetatarsal amputation.  For this he does wear insoles and a prosthetic leg.  However with these limitations he still stays relatively active.  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  He reports he is able to walk a block and upstairs with the ease.  Reports he does yard work and at least moderate housework.  Overall he has bilateral any anginal symptoms today.  He is able to complete greater than 4 METS.  Past Medical History    Past Medical History:  Diagnosis Date   A-fib [facility])    Acute nonintractable headache 03/25/2022   AKI (acute kidney injury) ([name])    Anemia    associated with chronic renal disease   Arthritis    Atrial fibrillation, chronic ([name])    not on [facility]   Cardiac arrest ([name]) 02/2022   in the setting of septic shock   Cirrhosis of liver ([name])    Concussion    Dysrhythmia    afib   Eosinophilic esophagitis    EGD/bx by Dr. 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   Type 2 diabetes mellitus with stage 4 chronic kidney disease [facility])    Past Surgical History:  Procedure Laterality Date   AMPUTATION Right 03/15/2022  Procedure: RIGHT BELOW KNEE AMPUTATION;  Surgeon: Timothy Ford, MD;  Location: [facility] OR;  Service: Orthopedics;  Laterality: Right;   AV FISTULA PLACEMENT Left 08/28/2022   Procedure: LEFT ARM ARTERIOVENOUS (AV) FISTULA CREATION;  Surgeon: Adine Hoof, MD;  Location: [facility] OR;  Service: Vascular;  Laterality: Left;   BIOPSY  10/26/2022   Procedure: BIOPSY;  Surgeon: Ace Holder, MD;  Location: [facility] ENDOSCOPY;  Service: Gastroenterology;;   CARDIOVERSION N/A 11/20/2022   Procedure: CARDIOVERSION;  Surgeon: Sonny Dust, MD;  Location: [facility] ENDOSCOPY;  Service: Cardiovascular;  Laterality: N/A;   COLONOSCOPY WITH PROPOFOL  N/A 10/26/2022   Procedure: COLONOSCOPY WITH PROPOFOL ;  Surgeon: Ace Holder, MD;  Location: [facility] ENDOSCOPY;  Service: Gastroenterology;  Laterality: N/A;   CYSTOSCOPY     CYSTOSCOPY WITH STENT PLACEMENT Bilateral 08/15/2022   Procedure: CYSTOSCOPY WITH STENT PLACEMENT;  Surgeon: Sherlyn Ditto, MD;  Location: [facility] OR;  Service: Urology;  Laterality: Bilateral;   CYSTOSCOPY/URETEROSCOPY/HOLMIUM LASER/STENT PLACEMENT Bilateral 09/14/2022   Procedure: CYSTOSCOPY/URETEROSCOPY/HOLMIUM LASER/STENT PLACEMENT;  Surgeon: Florencio Hunting, MD;  Location: [facility] ORS;  Service: Urology;  Laterality: Bilateral;   DENTAL SURGERY     ESOPHAGOGASTRODUODENOSCOPY (EGD) WITH PROPOFOL  N/A 10/26/2022   Procedure: ESOPHAGOGASTRODUODENOSCOPY (EGD) WITH PROPOFOL ;  Surgeon: Ace Holder, MD;  Location: [facility] ENDOSCOPY;  Service: Gastroenterology;  Laterality: N/A;   FINGER SURGERY     HERNIA REPAIR     at age 57   IR FLUORO GUIDE CV LINE RIGHT  03/09/2022   IR PATIENT EVAL TECH 0-60 MINS  03/09/2022   IR US  GUIDE VASC ACCESS RIGHT  03/09/2022   KNEE ARTHROSCOPY Left    LITHOTRIPSY     NEPHROLITHOTOMY     POLYPECTOMY  10/26/2022   Procedure: POLYPECTOMY;  Surgeon: Ace Holder, MD;  Location: [facility];  Service: Gastroenterology;;   Edmon Gosling EXPLORATION N/A 03/16/2022   Procedure: IRRIGATION AND DEBRIDEMENT SCROTUM;  Surgeon: Florencio Hunting, MD;  Location: [facility] OR;  Service: Urology;  Laterality: N/A;   SCROTAL EXPLORATION N/A 03/28/2022   Procedure: IRRIGATION AND DEBRIDEMENT SCROTUM;  Surgeon: Andrez Banker, MD;  Location: [facility] OR;  Service: Urology;  Laterality: N/A;   TONSILLECTOMY     TOTAL KNEE ARTHROPLASTY Bilateral 11/04/2018   Procedure: BILATERAL TOTAL KNEE ARTHROPLASTY;  Surgeon: Timothy Ford, MD;  Location: [facility] OR;  Service: Orthopedics;  Laterality: Bilateral;  spinal/epidural per  anesthesiologist   TRANSMETATARSAL AMPUTATION Left 03/15/2022   Procedure: LEFT TRANSMETATARSAL AMPUTATION;  Surgeon: Timothy Ford, MD;  Location: [facility] OR;  Service: Orthopedics;  Laterality: Left;    Allergies  Allergies  Allergen Reactions   Codeine Hives    Insomnia, jittery   Flomax  [Tamsulosin  Hcl] Nausea Only    Nausea and fatigue    Home Medications    Prior to Admission medications   Medication Sig Start Date End Date Taking? Authorizing Provider  acetaminophen  (TYLENOL ) 500 MG tablet Take 1,000 mg by mouth every 6 (six) hours as needed for moderate pain or headache. Rapid Release    [provider]  albuterol  (VENTOLIN  HFA) 108 (90 Base) MCG/ACT inhaler Inhale 2 puffs into the lungs every 6 (six) hours as needed for wheezing or shortness of breath. 01/11/24   Wellington Half, FNP  allopurinol  (ZYLOPRIM ) 100 MG tablet TAKE 2 TABLETS BY MOUTH EVERY DAY 01/17/24   Henson, Vickie L, NP-C  apixaban  (ELIQUIS ) 5 MG TABS tablet Take 1 tablet (5 mg total) by mouth 2 (two) times daily. 10/15/23  Harrold Lincoln, MD  diphenhydramine -acetaminophen  (TYLENOL  PM) 25-500 MG TABS tablet Take 2 tablets by mouth at bedtime as needed (sleep).    [provider]  furosemide  (LASIX ) 40 MG tablet Take 40 mg by mouth daily.    [provider]  gabapentin  (NEURONTIN ) 100 MG capsule TAKE 1 CAPSULE (100 MG TOTAL) BY MOUTH 3 TIMES A DAY 10/12/23   Zamora, Erin R, NP  meclizine  (ANTIVERT ) 25 MG tablet Take 1 tablet (25 mg total) by mouth 2 (two) times daily as needed for dizziness. 01/05/24   Henson, Vickie L, NP-C  mupirocin  ointment (BACTROBAN ) 2 % APPLY TO AFFECTED AREA TWICE A DAY 11/15/23   Henson, Vickie L, NP-C  ondansetron  (ZOFRAN ) 4 MG tablet Take 1 tablet (4 mg total) by mouth every 8 (eight) hours as needed for nausea or vomiting. 01/05/24   Henson, Vickie L, NP-C  pantoprazole  (PROTONIX ) 40 MG tablet TAKE 1 TABLET BY MOUTH TWICE A DAY 09/08/23   Armbruster,  Lendon Queen, MD  Potassium Chloride  ER 20 MEQ TBCR Take 1 tablet by mouth daily. 01/11/23   [provider]  promethazine  (PHENERGAN ) 6.25 MG/5ML solution Take 5 mLs (6.25 mg total) by mouth every 6 (six) hours as needed for nausea or vomiting. 01/11/24   Wellington Half, FNP  traMADol  (ULTRAM ) 50 MG tablet 1 po q hs prn pain 02/04/24   Jasmine Mesi, MD  traZODone  (DESYREL ) 50 MG tablet TAKE 1/2 OR 1 TABLET BY MOUTH AT BEDTIME AS NEEDED FOR SLEEP 11/23/23   Henson, Vickie L, NP-C  triamcinolone  (KENALOG ) 0.025 % ointment APPLY TO AFFECTED AREA TWICE A DAY 11/15/23   Abram Abraham, NP-C    Physical Exam    Vital Signs:  KASHUS BETHELL does not have vital signs available for review today.  Given telephonic nature of communication, physical exam is limited. AAOx3. NAD. Normal affect.  Speech and respirations are unlabored.  Accessory Clinical Findings    None  Assessment & Plan    1.  Preoperative Cardiovascular Risk Assessment: According to the Revised Cardiac Risk Index (RCRI), his Perioperative Risk of Major Cardiac Event is (%): 6.6. His Functional Capacity in METs is: 5.62 according to the Duke Activity Status Index (DASI). Therefore, based on ACC/AHA guidelines, patient would be at acceptable risk for the planned procedure without further cardiovascular testing.  The patient was advised that if he develops new symptoms prior to surgery to contact our office to arrange for a follow-up visit, and he verbalized understanding.  Per office protocol, patient can hold Eliquis  for 3 days prior to procedure.    A copy of this note will be routed to requesting surgeon.  Time:   Today, I have spent 7 minutes with the patient with telehealth technology discussing medical history, symptoms, and management plan.     Ava Boatman, NP  04/05/2024, 3:21 PM

## 2024-04-16 ENCOUNTER — Other Ambulatory Visit: Payer: Self-pay | Admitting: Family Medicine

## 2024-04-16 DIAGNOSIS — M109 Gout, unspecified: Secondary | ICD-10-CM

## 2024-05-06 ENCOUNTER — Other Ambulatory Visit: Payer: Self-pay | Admitting: Orthopedic Surgery

## 2024-05-06 ENCOUNTER — Other Ambulatory Visit: Payer: Self-pay | Admitting: Family Medicine

## 2024-05-06 DIAGNOSIS — L282 Other prurigo: Secondary | ICD-10-CM

## 2024-05-06 DIAGNOSIS — H8110 Benign paroxysmal vertigo, unspecified ear: Secondary | ICD-10-CM

## 2024-05-13 ENCOUNTER — Other Ambulatory Visit: Payer: Self-pay | Admitting: Family

## 2024-05-15 ENCOUNTER — Other Ambulatory Visit: Payer: Self-pay | Admitting: Nurse Practitioner

## 2024-05-15 ENCOUNTER — Encounter: Payer: Self-pay | Admitting: Nurse Practitioner

## 2024-05-15 DIAGNOSIS — K7581 Nonalcoholic steatohepatitis (NASH): Secondary | ICD-10-CM | POA: Diagnosis not present

## 2024-05-15 DIAGNOSIS — K7469 Other cirrhosis of liver: Secondary | ICD-10-CM | POA: Diagnosis not present

## 2024-05-15 DIAGNOSIS — Z9189 Other specified personal risk factors, not elsewhere classified: Secondary | ICD-10-CM | POA: Diagnosis not present

## 2024-05-17 ENCOUNTER — Ambulatory Visit
Admission: RE | Admit: 2024-05-17 | Discharge: 2024-05-17 | Disposition: A | Discharge status: Home / Self Care | Source: Ambulatory Visit | Attending: Nurse Practitioner | Admitting: Nurse Practitioner

## 2024-05-17 DIAGNOSIS — K7581 Nonalcoholic steatohepatitis (NASH): Secondary | ICD-10-CM

## 2024-05-17 DIAGNOSIS — K7460 Unspecified cirrhosis of liver: Secondary | ICD-10-CM | POA: Diagnosis not present

## 2024-05-17 DIAGNOSIS — K7469 Other cirrhosis of liver: Secondary | ICD-10-CM

## 2024-05-31 ENCOUNTER — Telehealth: Payer: Self-pay | Admitting: Orthopedic Surgery

## 2024-05-31 ENCOUNTER — Telehealth: Payer: Self-pay | Admitting: Family

## 2024-05-31 NOTE — Telephone Encounter (Signed)
 Rx on Erin desk to sign.  Per Twanna at [facility], Marcey said to let her know when she does her notes to just mention about socket replacement because insurance isn't going to cover hydraulic foot.

## 2024-05-31 NOTE — Telephone Encounter (Signed)
 Patient has decided to postpone his right reverse shoulder arthroplasty from July to November because he is really busy with work orders right now and also some outdoor activities and trips planned for this summer and the end of October. But in preparation for the surgery he has questions about his recovery and how to manage it, explaining he is a below the knee amputee on the right and has had amputation on part of the left foot.  He states he has developed strength in his arms but is trying to imagine what steps he might take to prepare for not being able to use that right arm (his dominant arm) for awhile.  Patient would like to talk to the doctor about the downtime, preparation for surgery, restrictions, exercises or physical therapy, and recovery timeline.  Please call [phone].  Surgery date is 10-17-24 at [facility].

## 2024-06-05 DIAGNOSIS — Z89511 Acquired absence of right leg below knee: Secondary | ICD-10-CM | POA: Diagnosis not present

## 2024-06-08 DIAGNOSIS — Z01818 Encounter for other preprocedural examination: Secondary | ICD-10-CM

## 2024-06-09 NOTE — Telephone Encounter (Signed)
 I called and reviewed things

## 2024-06-14 DIAGNOSIS — N200 Calculus of kidney: Secondary | ICD-10-CM | POA: Diagnosis not present

## 2024-06-14 DIAGNOSIS — N184 Chronic kidney disease, stage 4 (severe): Secondary | ICD-10-CM | POA: Diagnosis not present

## 2024-06-23 ENCOUNTER — Encounter: Admitting: Surgical

## 2024-07-15 ENCOUNTER — Other Ambulatory Visit: Payer: Self-pay | Admitting: Family Medicine

## 2024-07-15 DIAGNOSIS — M109 Gout, unspecified: Secondary | ICD-10-CM

## 2024-07-21 DIAGNOSIS — N179 Acute kidney failure, unspecified: Secondary | ICD-10-CM | POA: Diagnosis not present

## 2024-07-21 DIAGNOSIS — I129 Hypertensive chronic kidney disease with stage 1 through stage 4 chronic kidney disease, or unspecified chronic kidney disease: Secondary | ICD-10-CM | POA: Diagnosis not present

## 2024-07-21 DIAGNOSIS — E1122 Type 2 diabetes mellitus with diabetic chronic kidney disease: Secondary | ICD-10-CM | POA: Diagnosis not present

## 2024-07-21 DIAGNOSIS — N1832 Chronic kidney disease, stage 3b: Secondary | ICD-10-CM | POA: Diagnosis not present

## 2024-08-09 ENCOUNTER — Telehealth: Payer: Self-pay

## 2024-08-09 NOTE — Telephone Encounter (Signed)
 Appointment scheduled for 08/16/24 @ 9:20am. Med req and consent are complete.

## 2024-08-09 NOTE — Telephone Encounter (Signed)
   Name: DEMONI GERGEN  DOB: 29-Nov-1960  MRN: [id number]  Primary Cardiologist: Darryle ONEIDA Decent, MD  Chart reviewed as part of pre-operative protocol coverage. Because of KEIDAN AUMILLER past medical history and time since last visit, he will require a follow-up in-office visit in order to better assess preoperative cardiovascular risk. Pt is overdue for annual follow-up.   Pre-op  covering staff: - Please schedule appointment and call patient to inform them. If patient already had an upcoming appointment within acceptable timeframe, please add pre-op  clearance to the appointment notes so provider is aware. - Please contact requesting surgeon's office via preferred method (i.e, phone, fax) to inform them of need for appointment prior to surgery.  This message will also be routed to pharmacy pool for input on holding Eliquis  as requested below so that this information is available to the clearing provider at time of patient's appointment.   Damien JAYSON Braver, NP  08/09/2024, 2:11 PM

## 2024-08-09 NOTE — Telephone Encounter (Signed)
  Patient Consent for Virtual Visit         Jeffrey Ayala has provided verbal consent on 08/09/2024 for a virtual visit (video or telephone).  Appointment scheduled for 08/16/24 @ 9:20am. Med req and consent are complete.    CONSENT FOR VIRTUAL VISIT FOR:  Jeffrey Ayala  By participating in this virtual visit I agree to the following:  I hereby voluntarily request, consent and authorize [facility] and its employed or contracted physicians, physician assistants, nurse practitioners or other licensed health care professionals (the Practitioner), to provide me with telemedicine health care services (the "Services) as deemed necessary by the treating Practitioner. I acknowledge and consent to receive the Services by the Practitioner via telemedicine. I understand that the telemedicine visit will involve communicating with the Practitioner through live audiovisual communication technology and the disclosure of certain medical information by electronic transmission. I acknowledge that I have been given the opportunity to request an in-person assessment or other available alternative prior to the telemedicine visit and am voluntarily participating in the telemedicine visit.  I understand that I have the right to withhold or withdraw my consent to the use of telemedicine in the course of my care at any time, without affecting my right to future care or treatment, and that the Practitioner or I may terminate the telemedicine visit at any time. I understand that I have the right to inspect all information obtained and/or recorded in the course of the telemedicine visit and may receive copies of available information for a reasonable fee.  I understand that some of the potential risks of receiving the Services via telemedicine include:  Delay or interruption in medical evaluation due to technological equipment failure or disruption; Information transmitted may not be sufficient (e.g. poor  resolution of images) to allow for appropriate medical decision making by the Practitioner; and/or  In rare instances, security protocols could fail, causing a breach of personal health information.  Furthermore, I acknowledge that it is my responsibility to provide information about my medical history, conditions and care that is complete and accurate to the best of my ability. I acknowledge that Practitioner's advice, recommendations, and/or decision may be based on factors not within their control, such as incomplete or inaccurate data provided by me or distortions of diagnostic images or specimens that may result from electronic transmissions. I understand that the practice of medicine is not an exact science and that Practitioner makes no warranties or guarantees regarding treatment outcomes. I acknowledge that a copy of this consent can be made available to me via my patient portal [facility] MyChart), or I can request a printed copy by calling the office of [facility].    I understand that my insurance will be billed for this visit.   I have read or had this consent read to me. I understand the contents of this consent, which adequately explains the benefits and risks of the Services being provided via telemedicine.  I have been provided ample opportunity to ask questions regarding this consent and the Services and have had my questions answered to my satisfaction. I give my informed consent for the services to be provided through the use of telemedicine in my medical care

## 2024-08-09 NOTE — Telephone Encounter (Signed)
   Pre-operative Risk Assessment    Patient Name: Jeffrey Ayala  DOB: 10-24-1961 MRN: [id number]   Date of last office visit: 07/08/23 Date of next office visit: none  Request for Surgical Clearance    Procedure:  patient has multiple draining infections in the mouth we need to pull 4 teeth for initial concern We will need to pull 4 teeth to remove all the infection. Patient has history of sepsis.   Date of Surgery:  Clearance TBD                                 Surgeon:  Dr. Feliciano Molt Surgeon's Group or Practice Name:  [facility] Phone number:  [phone] Fax number:  [phone]   Type of Clearance Requested:   - Medical  - Pharmacy:  Hold Apixaban  (Eliquis ) EPI   Type of Anesthesia:  Not Indicated   Additional requests/questions:  N/A  [name]   08/09/2024, 1:48 PM

## 2024-08-15 NOTE — Telephone Encounter (Signed)
 Patient with diagnosis of afib on Eliquis  for anticoagulation.    Procedure: 4 teeth extraction Date of procedure: TBD   CHA2DS2-VASc Score = 2   This indicates a 2.2% annual risk of stroke. The patient's score is based upon: CHF History: 0 HTN History: 1 Diabetes History: 0 Stroke History: 0 Vascular Disease History: 1 Age Score: 0 Gender Score: 0      CrCl 43 ml/min Platelet count 355  Patient has not had an Afib/aflutter ablation or Watchman within the last 3 months or DCCV within the last 30 days   Patient does not require pre-op  antibiotics for dental procedure for SBE prophylaxis.  Per office protocol, patient can hold Eliquis  for 1 day prior to procedure.    **This guidance is not considered finalized until pre-operative APP has relayed final recommendations.**

## 2024-08-16 ENCOUNTER — Encounter: Payer: Self-pay | Admitting: Physician Assistant

## 2024-08-16 ENCOUNTER — Ambulatory Visit: Attending: Physician Assistant | Admitting: Physician Assistant

## 2024-08-16 ENCOUNTER — Ambulatory Visit

## 2024-08-16 VITALS — BP 110/80 | HR 91 | Ht 70.0 in | Wt 245.0 lb

## 2024-08-16 DIAGNOSIS — I10 Essential (primary) hypertension: Secondary | ICD-10-CM

## 2024-08-16 DIAGNOSIS — Z01810 Encounter for preprocedural cardiovascular examination: Secondary | ICD-10-CM | POA: Diagnosis not present

## 2024-08-16 DIAGNOSIS — I4819 Other persistent atrial fibrillation: Secondary | ICD-10-CM | POA: Diagnosis not present

## 2024-08-16 DIAGNOSIS — N184 Chronic kidney disease, stage 4 (severe): Secondary | ICD-10-CM

## 2024-08-16 NOTE — Telephone Encounter (Signed)
 Jeffrey Ayala. NP: This guy is on my schedule today. Has not been seen in >1 year. Pre Op APP said in-office visit but he was scheduled for Tele. Appt needs to be canceled for today and he needs to be scheduled for in office visit. Thanks   Me: I will take care of this. he is seeing Glendia Ferrier today 10:30    I will update all parties involved.

## 2024-08-16 NOTE — Progress Notes (Signed)
 OFFICE NOTE:    Date:  08/16/2024  ID:  Jeffrey Ayala, DOB 1961-11-02, MRN [id number] PCP: Lendia Boby CROME, NP-C  [facility] Providers Cardiologist:  Darryle ONEIDA Decent, MD        Paroxysmal atrial fibrillation S/p DCCV 10/2022 TTE 02/23/2022: EF 60-65, no RWMA, mild LVH, normal RVSF History of cardiac arrest 2023 in the setting of septic shock C/b gangrene of lower extremities S/p left transmetatarsal amputation S/p right BKA Chronic kidney disease Nephrology: Dr. Alica Hypertension  Hepatic steatosis Sleep apnea Anemia        Discussed the use of AI scribe software for clinical note transcription with the patient, who gave verbal consent to proceed. History of Present Illness Jeffrey Ayala is a 63 y.o. male who returns for surgical clearance.  He needs oral surgery.  4 teeth need to be pulled.  It has been requested the Eliquis  be held.  His history was reviewed by our Pharm.D.  Per protocol, Eliquis  can be held for 1 day prior to procedure.  He has not had chest pain, shortness of breath, or palpitations. He denies any recent episodes of feeling his heart skipping or racing, and he has not experienced unusual fatigue or exhaustion. He is able to perform activities such as climbing stairs, carrying groceries, and walking a couple of blocks without experiencing chest pain. He has a history of sleep apnea but does not use a CPAP machine due to discomfort. He denies any history of heart attack, heart failure, stroke, or diabetes.  He does not smoke and has stopped drinking alcohol since his youth.    ROS-See HPI    Studies Reviewed:  EKG Interpretation Date/Time:  Wednesday August 16 2024 09:49:39 EDT Ventricular Rate:  91 PR Interval:    QRS Duration:  78 QT Interval:  358 QTC Calculation: 440 R Axis:   29  Text Interpretation: Atrial fibrillation Low voltage QRS Confirmed by Lelon Hamilton [phone]) on 08/16/2024 10:59:20 AM    01/19/2024: K 3.7,  creatinine 2.1, ALT 16, Hgb 12.4, PLT 355K  Risk Assessment/Calculations: CHA2DS2-VASc Score = 2   This indicates a 2.2% annual risk of stroke. The patient's score is based upon: CHF History: 0 HTN History: 1 Diabetes History: 0 Stroke History: 0 Vascular Disease History: 1 Age Score: 0 Gender Score: 0           Physical Exam:  VS:  BP 110/80   Pulse 91   Ht 5' 10 (1.778 m)   Wt 245 lb (111.1 kg)   SpO2 95%   BMI 35.15 kg/m        Wt Readings from Last 3 Encounters:  08/16/24 245 lb (111.1 kg)  01/19/24 228 lb 8 oz (103.6 kg)  01/11/24 229 lb (103.9 kg)    Constitutional:      Appearance: Healthy appearance. Not in distress.  Neck:     Vascular: JVD normal.  Pulmonary:     Breath sounds: Normal breath sounds. No wheezing. No rales.  Cardiovascular:     Normal rate. Irregularly irregular rhythm.     Murmurs: There is no murmur.  Edema:    Peripheral edema absent.  Abdominal:     Palpations: Abdomen is soft.  Musculoskeletal:     Comments: Prosthetic limb in place     Right Lower Extremity: Right leg is amputated below knee.      Assessment and Plan:    Assessment & Plan Persistent atrial fibrillation [facility]) He is back  in atrial fibrillation. Rate is controlled. He is asymptomatic. He was in AFib in 12/2023 when he went to the emergency room for URI as well. He has probably been in it since.  -Continue Eliquis  5 mg twice daily -Follow up in 4 weeks with EKG -Will arrange DCCV at that time if still in AFib Preoperative cardiovascular examination Mr. Bondy's perioperative risk of a major cardiac event is 0.9% according to the Revised Cardiac Risk Index (RCRI).  Therefore, he is at low risk for perioperative complications.   His functional capacity is good at 4.31 METs according to the Duke Activity Status Index (DASI). Recommendations: According to ACC/AHA guidelines, no further cardiovascular testing needed.  The patient may proceed to surgery at acceptable  risk.   Antiplatelet and/or Anticoagulation Recommendations: Eliquis  (Apixaban ) can be held for 1 days prior to surgery.  Please resume post op when felt to be safe.   Essential hypertension The patient's blood pressure is controlled on his current regimen.  Continue current therapy.   CKD (chronic kidney disease) stage 4, GFR 15-29 ml/min ([name]) Renal function has improved. He is followed by nephrology.         Dispo:  Return in about 4 weeks (around 09/13/2024) for Routine Follow Up with Dr. Barbaraann, or Glendia Ferrier, PA-C.  Signed, Glendia Ferrier, PA-C

## 2024-08-16 NOTE — Assessment & Plan Note (Addendum)
 He is back in atrial fibrillation. Rate is controlled. He is asymptomatic. He was in AFib in 12/2023 when he went to the emergency room for URI as well. He has probably been in it since.  -Continue Eliquis  5 mg twice daily -Follow up in 4 weeks with EKG -Will arrange DCCV at that time if still in AFib

## 2024-08-16 NOTE — Patient Instructions (Signed)
 Medication Instructions:  Your physician recommends that you continue on your current medications as directed. Please refer to the Current Medication list given to you today.  *If you need a refill on your cardiac medications before your next appointment, please call your pharmacy*  Lab Work: None ordered  If you have labs (blood work) drawn today and your tests are completely normal, you will receive your results only by: MyChart Message (if you have MyChart) OR A paper copy in the mail If you have any lab test that is abnormal or we need to change your treatment, we will call you to review the results.  Testing/Procedures: None ordered  Follow-Up: At [facility], you and your health needs are our priority.  As part of our continuing mission to provide you with exceptional heart care, our providers are all part of one team.  This team includes your primary Cardiologist (physician) and Advanced Practice Providers or APPs (Physician Assistants and Nurse Practitioners) who all work together to provide you with the care you need, when you need it.  Your next appointment:   4 week(s)  Provider:   Darryle ONEIDA Decent, MD or Glendia Ferrier, PA-C          We recommend signing up for the patient portal called MyChart.  Sign up information is provided on this After Visit Summary.  MyChart is used to connect with patients for Virtual Visits (Telemedicine).  Patients are able to view lab/test results, encounter notes, upcoming appointments, etc.  Non-urgent messages can be sent to your provider as well.   To learn more about what you can do with MyChart, go to [email or web address].   Other Instructions You can hold your Eliquis  for 1 day for your dental work and resume it as soon as the dentist gives you the ok

## 2024-08-16 NOTE — Assessment & Plan Note (Signed)
 Renal function has improved. He is followed by nephrology.

## 2024-08-16 NOTE — Assessment & Plan Note (Addendum)
The patient's blood pressure is controlled on his current regimen.  Continue current therapy.  

## 2024-08-17 NOTE — Telephone Encounter (Signed)
 Notes sent to surgeon. Glendia Ferrier, PA-C    08/17/2024 8:42 AM

## 2024-09-06 ENCOUNTER — Other Ambulatory Visit: Payer: Self-pay | Admitting: Gastroenterology

## 2024-09-12 ENCOUNTER — Encounter: Payer: Self-pay | Admitting: *Deleted

## 2024-09-12 NOTE — Progress Notes (Unsigned)
 OFFICE NOTE:    Date:  09/13/2024  ID:  Jeffrey Ayala, DOB [birth year], MRN [id number] PCP: Lendia Boby CROME, NP-C  [facility] Providers Cardiologist:  Darryle ONEIDA Decent, MD        Paroxysmal atrial fibrillation S/p DCCV 10/2022 TTE 02/23/2022: EF 60-65, no RWMA, mild LVH, normal RVSF History of cardiac arrest 2023 in the setting of septic shock C/b gangrene of lower extremities S/p left transmetatarsal amputation S/p right BKA Chronic kidney disease Nephrology: Dr. Alica Hypertension  Hepatic steatosis Sleep apnea Anemia        Discussed the use of AI scribe software for clinical note transcription with the patient, who gave verbal consent to proceed. History of Present Illness Jeffrey Ayala is a 63 y.o. male who returns for follow up of atrial fibrillation. He was last seen 08/16/24 and was back in AFib with controlled rate. He was having a dental procedure soon and had to hold Eliquis  for 1 day. He is brought back for follow up after being back on anticoagulation w/o interruption with an eye towards DCCV if her remains in AFib.   He is scheduled for a shoulder replacement on October 17, 2024, and is concerned about the timing of cardioversion in relation to this surgery.  No current chest pain, shortness of breath, weakness, dizziness, or syncope. He has not experienced a stroke in the past.     ROS-See HPI     Studies Reviewed:  EKG Interpretation Date/Time:  Wednesday September 13 2024 08:31:38 EDT Ventricular Rate:  100 PR Interval:    QRS Duration:  82 QT Interval:  354 QTC Calculation: 456 R Axis:   -8  Text Interpretation: Atrial fibrillation Low voltage QRS Inferior infarct Cannot rule out Anterior infarct , age undetermined No significant change since last tracing Confirmed by Lelon Hamilton [phone]) on 09/13/2024 9:01:45 AM     Risk Assessment/Calculations: CHA2DS2-VASc Score = 2   This indicates a 2.2% annual risk of stroke. The  patient's score is based upon: CHF History: 0 HTN History: 1 Diabetes History: 0 Stroke History: 0 Vascular Disease History: 1 Age Score: 0 Gender Score: 0           Physical Exam:  VS:  BP 114/70   Pulse 100   Ht 5' 10 (1.778 m)   Wt 242 lb 9.6 oz (110 kg)   SpO2 97%   BMI 34.81 kg/m        Wt Readings from Last 3 Encounters:  09/13/24 242 lb 9.6 oz (110 kg)  08/16/24 245 lb (111.1 kg)  01/19/24 228 lb 8 oz (103.6 kg)    Constitutional:      Appearance: Healthy appearance. Not in distress.  Pulmonary:     Breath sounds: Normal breath sounds. No wheezing. No rales.  Cardiovascular:     Normal rate. Regular rhythm.     Murmurs: There is no murmur.  Skin:    General: Skin is warm and dry.        Assessment and Plan:    Assessment & Plan Persistent atrial fibrillation ([name]) He remains in atrial fibrillation. HR is 100 today. He is asymptomatic, experiencing no chest pain, shortness of breath, weakness, dizziness, or syncope. We had planned to arrange cardioversion today. But, he has upcoming shoulder surgery. This would need to be pushed back to allow him to take Eliquis  w/o interruption for 4 weeks. He does not want to do this. Therefore, will hold off on DCCV at  this time. We can arrange follow up in Dec to allow him time to be back on Eliquis  for at least 3 weeks prior to arranging DCCV. - Prescribe Metoprolol  succinate 25 mg once daily  - Continue Eliquis  5 mg twice daily  - Schedule follow-up visit the week of December 22nd with me or Dr. Barbaraann  - He may be a good candidate for PVI ablation. Consider referral to EP after DCCV.  CKD (chronic kidney disease) stage 4, GFR 15-29 ml/min ([name]) Managed by [facility].  Preoperative cardiovascular examination His risk of perioperative CV complications is low according to RCRI which is 1 (0.9%). He is not having any unstable cardiac symptoms. His HR is reasonably controlled. He can proceed with his shoulder surgery at  acceptable risk.          Dispo:  Return in about 8 weeks (around 11/08/2024) for Routine Follow Up w/ Dr. Barbaraann, or Glendia Ferrier, PA-C.  Signed, Glendia Ferrier, PA-C

## 2024-09-12 NOTE — Assessment & Plan Note (Signed)
 SABRA

## 2024-09-13 ENCOUNTER — Ambulatory Visit: Attending: Physician Assistant | Admitting: Physician Assistant

## 2024-09-13 ENCOUNTER — Encounter: Payer: Self-pay | Admitting: Physician Assistant

## 2024-09-13 VITALS — BP 114/70 | HR 100 | Ht 70.0 in | Wt 242.6 lb

## 2024-09-13 DIAGNOSIS — I10 Essential (primary) hypertension: Secondary | ICD-10-CM

## 2024-09-13 DIAGNOSIS — N184 Chronic kidney disease, stage 4 (severe): Secondary | ICD-10-CM

## 2024-09-13 DIAGNOSIS — Z01810 Encounter for preprocedural cardiovascular examination: Secondary | ICD-10-CM

## 2024-09-13 DIAGNOSIS — I4819 Other persistent atrial fibrillation: Secondary | ICD-10-CM

## 2024-09-13 MED ORDER — METOPROLOL SUCCINATE ER 25 MG PO TB24: 25.0000 mg | ORAL_TABLET | Freq: Every day | ORAL | 1 refills | Status: AC

## 2024-09-13 NOTE — Assessment & Plan Note (Signed)
 Managed by [facility].

## 2024-09-13 NOTE — Patient Instructions (Addendum)
 Medication Instructions:  START Toprol  (metoprolol  succinate) 25mg  Take 1 tablet once day  *If you need a refill on your cardiac medications before your next appointment, please call your pharmacy*  Lab Work: None Ordered If you have labs (blood work) drawn today and your tests are completely normal, you will receive your results only by: MyChart Message (if you have MyChart) OR A paper copy in the mail If you have any lab test that is abnormal or we need to change your treatment, we will call you to review the results.  Testing/Procedures: None Ordered  Follow-Up: At [facility], you and your health needs are our priority.  As part of our continuing mission to provide you with exceptional heart care, our providers are all part of one team.  This team includes your primary Cardiologist (physician) and Advanced Practice Providers or APPs (Physician Assistants and Nurse Practitioners) who all work together to provide you with the care you need, when you need it.  Your next appointment:   2 month(s)  Provider:   Darryle ONEIDA Decent, MD or Glendia Ferrier, PA  We recommend signing up for the patient portal called MyChart.  Sign up information is provided on this After Visit Summary.  MyChart is used to connect with patients for Virtual Visits (Telemedicine).  Patients are able to view lab/test results, encounter notes, upcoming appointments, etc.  Non-urgent messages can be sent to your provider as well.   To learn more about what you can do with MyChart, go to [email or web address].   Other Instructions

## 2024-09-25 ENCOUNTER — Encounter: Payer: Self-pay | Admitting: Radiology

## 2024-10-03 ENCOUNTER — Other Ambulatory Visit: Payer: Self-pay | Admitting: Gastroenterology

## 2024-10-04 ENCOUNTER — Other Ambulatory Visit (INDEPENDENT_AMBULATORY_CARE_PROVIDER_SITE_OTHER): Payer: Self-pay

## 2024-10-04 ENCOUNTER — Ambulatory Visit: Admitting: Family

## 2024-10-04 DIAGNOSIS — M79672 Pain in left foot: Secondary | ICD-10-CM

## 2024-10-06 ENCOUNTER — Encounter: Payer: Self-pay | Admitting: Family

## 2024-10-06 NOTE — Progress Notes (Signed)
 Office Visit Note   Patient: Jeffrey Ayala           Date of Birth: 01/05/1961           MRN: [id number] Visit Date: 10/04/2024              Requested by: Lendia Boby CROME, NP-C [address] [place],  [place] [address] PCP: Lendia Boby CROME, NP-C  Chief Complaint  Patient presents with   Left Leg - Pain      HPI: the patient is a 63 year old gentleman who is seen today for concern of left lateral column pain after being off on his feet much more than usual last week.  He is status post below-knee amputation on the right transmetatarsal amputation on the left  Was on a trach show last week he has a history of intermittent lateral foot pain he does have custom orthotics with spacer  No injury no erythema no warmth no wound  Assessment & Plan: Visit Diagnoses:  1. Pain in left foot     Plan: Contusion left foot discussed possibly removing the spacer from his shoe wear for the next few weeks  Follow-Up Instructions: No follow-ups on file.   Ortho Exam  Patient is alert, oriented, no adenopathy, well-dressed, normal affect, normal respiratory effort. On examination left foot there is a well-healed transmetatarsal amputation there is no deformity no ecchymosis no erythema no impending ulceration has significant bony tenderness over the distal metatarsal on lateral column    Imaging: No results found. No images are attached to the encounter.  Labs: Lab Results  Component Value Date   HGBA1C 5.5 02/23/2022   ESRSEDRATE 20 (H) 02/22/2022   CRP 13.8 (H) 02/23/2022   LABURIC 7.7 09/02/2023   LABURIC 9.0 (H) 03/05/2023   LABURIC 8.5 (H) 05/27/2022   REPTSTATUS 04/05/2022 FINAL 03/16/2022   GRAMSTAIN  03/16/2022    NO WBC SEEN ABUNDANT GRAM POSITIVE COCCI FEW GRAM NEGATIVE RODS    CULT  03/16/2022    ABUNDANT ENTEROCOCCUS FAECALIS FEW PSEUDOMONAS AERUGINOSA MULTI-DRUG RESISTANT ORGANISM ABUNDANT BACTEROIDES OVATUS BETA LACTAMASE POSITIVE CRITICAL RESULT  CALLED TO, READ BACK BY AND VERIFIED WITH: PHARMD K PIERCE [id number] AT 1019 BY CM Sent to [facility] for further susceptibility testing. SEE REPORT IN EPIC Performed at [facility], [address], [place], [place] [address]    [facility] ENTEROCOCCUS FAECALIS 03/16/2022   LABORGA PSEUDOMONAS AERUGINOSA 03/16/2022     Lab Results  Component Value Date   ALBUMIN  4.1 01/19/2024   ALBUMIN  3.2 (L) 01/12/2024   ALBUMIN  4.1 01/11/2024    Lab Results  Component Value Date   MG 1.9 04/18/2022   MG 2.1 04/01/2022   MG 2.0 03/31/2022   No results found for: VD25OH  No results found for: PREALBUMIN    Latest Ref Rng & Units 01/19/2024    3:58 PM 01/12/2024    9:57 AM 01/11/2024    3:58 PM  CBC EXTENDED  WBC 4.0 - 10.5 K/uL 11.2  14.2  14.0   RBC 4.22 - 5.81 Mil/uL 4.03  3.94  4.02   Hemoglobin 13.0 - 17.0 g/dL 87.5  87.6  87.3   HCT 39.0 - 52.0 % 37.8  36.9  37.4   Platelets 150.0 - 400.0 K/uL 355.0  311  313.0   NEUT# 1.4 - 7.7 K/uL 8.8  12.6  11.4   Lymph# 0.7 - 4.0 K/uL 1.5  0.6  1.3      There is  no height or weight on file to calculate BMI.  Orders:  Orders Placed This Encounter  Procedures   XR Foot Complete Left   No orders of the defined types were placed in this encounter.    Procedures: No procedures performed  Clinical Data: No additional findings.  ROS:  All other systems negative, except as noted in the HPI. Review of Systems  Objective: Vital Signs: There were no vitals taken for this visit.  Specialty Comments:  No specialty comments available.  PMFS History: Patient Active Problem List   Diagnosis Date Noted   Multifocal pneumonia 01/19/2024   CKD (chronic kidney disease) stage 4, GFR 15-29 ml/min ([name]) 01/12/2024   Dyslipidemia 09/02/2023   Pruritic rash 03/05/2023   Left foot pain 03/05/2023   OSA (obstructive sleep apnea) 11/08/2022   Benign neoplasm of colon 10/26/2022   Gastric polyp 10/26/2022   Suspected sleep apnea  10/01/2022   Daytime somnolence 10/01/2022   Snoring 10/01/2022   Acute bilateral obstructive uropathy 08/15/2022   Skin breakdown 07/29/2022   Suicidal ideations 06/09/2022   Chills (without fever) 06/09/2022   Severe depression ([name]) 06/09/2022   Acute cough 06/09/2022   End stage renal disease ([name]) 06/02/2022   Reactive depression 05/31/2022   Belching 05/27/2022   Gastroesophageal reflux disease 05/27/2022   Atrial fibrillation ([name]) 05/20/2022   Long term (current) use of anticoagulants 05/20/2022   S/P transmetatarsal amputation of foot, left ([name]) 04/18/2022   CKD (chronic kidney disease) stage 5, GFR less than 15 ml/min ([name]) 04/18/2022   Secondary hyperparathyroidism of renal origin 04/10/2022   Pruritus, unspecified 04/10/2022   Nausea 04/10/2022   Iron deficiency anemia, unspecified 04/10/2022   Hypothyroidism, unspecified 04/10/2022   Other acute kidney failure 04/10/2022   Dyspnea, unspecified 04/10/2022   Eosinophilic esophagitis 04/10/2022   Gastric ulcer, unspecified as acute or chronic, without hemorrhage or perforation 04/10/2022   Coagulation defect, unspecified 04/10/2022   Allergy, unspecified, initial encounter 04/10/2022   Anaphylactic shock, unspecified, initial encounter 04/10/2022   Arthralgia of right lower leg    Acute blood loss anemia    Unilateral complete BKA, right, initial encounter ([name]) 04/03/2022   S/P BKA (below knee amputation), right ([name]) 04/03/2022   Old myocardial infarction 04/02/2022   Dependence on renal dialysis 04/02/2022   Acquired absence of right leg below knee ([name]) 04/02/2022   Cirrhosis ([name]) 03/31/2022   Fournier's gangrene in male [facility])    Scrotal infection 03/25/2022   Anemia 03/25/2022   Diarrhea 03/25/2022   Shock liver 03/25/2022   Acute respiratory failure with hypoxia ([name]) 03/25/2022   Gout 03/25/2022   Acute nonintractable headache 03/25/2022   Demand ischemia ([name]) 03/25/2022   Thrombocytopenia 03/25/2022    Obesity, Class III, BMI 40-49.9 (morbid obesity) ([name]) 03/25/2022   Cardiac arrest ([name]) 03/25/2022   Hypotension    Respiratory insufficiency    Cellulitis    Malnutrition of moderate degree 03/12/2022   Altered mental status    Gangrene of right foot ([name])    Gangrene of left foot ([name])    Severe protein-calorie malnutrition 03/10/2022   Colon cancer screening    Pressure injury of skin 02/23/2022   Septic shock ([name]) 02/22/2022   AKI (acute kidney injury)    Generalized abdominal pain    Lactic acidosis    Atrial fibrillation with RVR ([name])    Hypokalemia    Hypomagnesemia    Pain in finger of right hand 08/14/2021   Degenerative joint disease of shoulder region 05/07/2020  Olecranon bursitis of right elbow 05/07/2020   Chronic right shoulder pain 04/26/2020   S/P TKR (total knee replacement), bilateral 11/04/2018   Bilateral primary osteoarthritis of knee    DDD (degenerative disc disease), cervical 01/10/2018   Kidney stones 06/22/2016   Essential hypertension 08/01/2010   Dysthymic disorder 07/08/2010   PALPITATIONS 07/08/2010   Past Medical History:  Diagnosis Date   A-fib [facility])    Acute nonintractable headache 03/25/2022   AKI (acute kidney injury)    Anemia    associated with chronic renal disease   Arthritis    Atrial fibrillation, chronic ([name])    not on [facility]   Cardiac arrest ([name]) 02/2022   in the setting of septic shock   Cirrhosis of liver ([name])    Concussion    Dysrhythmia    afib   Eosinophilic esophagitis    EGD/bx by Dr. 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   Type 2 diabetes mellitus with stage 4 chronic kidney disease ([name])     Family History  Problem Relation Age of Onset    Arrhythmia Mother    COPD Mother    Diabetes Mother    Kidney disease Mother    Gout Father    Kidney disease Father    Sarcoidosis Brother    Cancer Brother    Stomach cancer Neg Hx    Pancreatic cancer Neg Hx    Esophageal cancer Neg Hx    Colon cancer Neg Hx    Rectal cancer Neg Hx     Past Surgical History:  Procedure Laterality Date   AMPUTATION Right 03/15/2022   Procedure: RIGHT BELOW KNEE AMPUTATION;  Surgeon: Harden Jerona GAILS, MD;  Location: [facility] OR;  Service: Orthopedics;  Laterality: Right;   AV FISTULA PLACEMENT Left 08/28/2022   Procedure: LEFT ARM ARTERIOVENOUS (AV) FISTULA CREATION;  Surgeon: Sheree Penne Bruckner, MD;  Location: [facility] OR;  Service: Vascular;  Laterality: Left;   BIOPSY  10/26/2022   Procedure: BIOPSY;  Surgeon: Leigh Elspeth SQUIBB, MD;  Location: [facility] ENDOSCOPY;  Service: Gastroenterology;;   CARDIOVERSION N/A 11/20/2022   Procedure: CARDIOVERSION;  Surgeon: Hobart Powell BRAVO, MD;  Location: [facility] ENDOSCOPY;  Service: Cardiovascular;  Laterality: N/A;   COLONOSCOPY WITH PROPOFOL  N/A 10/26/2022   Procedure: COLONOSCOPY WITH PROPOFOL ;  Surgeon: Leigh Elspeth SQUIBB, MD;  Location: [facility]COPY;  Service: Gastroenterology;  Laterality: N/A;   CYSTOSCOPY     CYSTOSCOPY WITH STENT PLACEMENT Bilateral 08/15/2022   Procedure: CYSTOSCOPY WITH STENT PLACEMENT;  Surgeon: Rosalind Zachary NOVAK, MD;  Location[facility] OR;  Service: Urology;  Laterality: Bilateral;   CYSTOSCOPY/URETEROSCOPY/HOLMIUM LASER/STENT PLACEMENT Bilateral 09/14/2022   Procedure: CYSTOSCOPY/URETEROSCOPY/HOLMIUM LASER/STENT PLACEMENT;  Surgeon: Renda Glance, MD;  Location: [facility] ORS;  Service: Urology;  Laterality: Bilateral;   DENTAL SURGERY     ESOPHAGOGASTRODUODENOSCOPY (EGD) WITH PROPOFOL  N/A 10/26/2022   Procedure: ESOPHAGOGASTRODUODENOSCOPY (EGD) WITH PROPOFOL ;  Surgeon: Leigh Elspeth SQUIBB, MD;  Location: [facility] ENDOSCOPY;  Service: Gastroenterology;  Laterality: N/A;   FINGER SURGERY     HERNIA REPAIR      at age 68   IR FLUORO GUIDE CV LINE RIGHT  03/09/2022   IR PATIENT EVAL TECH 0-60 MINS  03/09/2022   IR US  GUIDE VASC ACCESS RIGHT  03/09/2022   KNEE ARTHROSCOPY Left    LITHOTRIPSY     NEPHROLITHOTOMY     POLYPECTOMY  10/26/2022   Procedure: POLYPECTOMY;  Surgeon: Leigh Elspeth SQUIBB, MD;  Location: [facility];  Service: Gastroenterology;;   THALIA EXPLORATION N/A 03/16/2022   Procedure: IRRIGATION AND DEBRIDEMENT SCROTUM;  Surgeon: Renda Glance, MD;  Location: [facility] OR;  Service: Urology;  Laterality: N/A;   SCROTAL EXPLORATION N/A 03/28/2022   Procedure: IRRIGATION AND DEBRIDEMENT SCROTUM;  Surgeon: Cam Morene ORN, MD;  Location: [facility] OR;  Service: Urology;  Laterality: N/A;   TONSILLECTOMY     TOTAL KNEE ARTHROPLASTY Bilateral 11/04/2018   Procedure: BILATERAL TOTAL KNEE ARTHROPLASTY;  Surgeon: Harden Jerona GAILS, MD;  Location: [facility] OR;  Service: Orthopedics;  Laterality: Bilateral;  spinal/epidural per anesthesiologist   TRANSMETATARSAL AMPUTATION Left 03/15/2022   Procedure: LEFT TRANSMETATARSAL AMPUTATION;  Surgeon: Harden Jerona GAILS, MD;  Location: [facility] OR;  Service: Orthopedics;  Laterality: Left;   Social History   Occupational History   Occupation: Physiological Scientist  Tobacco Use   Smoking status: Never   Smokeless tobacco: Never  Vaping Use   Vaping status: Never Used  Substance and Sexual Activity   Alcohol use: Not Currently   Drug use: No   Sexual activity: Not Currently

## 2024-10-07 ENCOUNTER — Other Ambulatory Visit: Payer: Self-pay | Admitting: Gastroenterology

## 2024-10-11 ENCOUNTER — Telehealth: Payer: Self-pay | Admitting: Orthopedic Surgery

## 2024-10-11 NOTE — Telephone Encounter (Signed)
 Jeffrey Ayala.  I just talk with [facility].  He wants to hold off on his surgery.  He uses his arms a lot to get up and around and he says is not hurting him too bad.  So can you cancel him for Tuesday and see if you can find something else to put on during that time.  Thanks

## 2024-10-11 NOTE — Telephone Encounter (Signed)
 Pt called saying that they wanted to talk to the Dr about his upcomming surgery and the fact that he wants to know how long he will be in the sling and the fact that he might not br able to have the surgery due to the fact that he has an amputated  leg and needs to be able to use his rt shoulder. Pt hung p on me as I was typing this message. The call back number I'm using is the one in the chart. Call back number is [phone].

## 2024-10-13 NOTE — Telephone Encounter (Signed)
 thx

## 2024-10-14 ENCOUNTER — Other Ambulatory Visit: Payer: Self-pay | Admitting: Family Medicine

## 2024-10-14 ENCOUNTER — Other Ambulatory Visit: Payer: Self-pay | Admitting: Cardiovascular Disease

## 2024-10-14 DIAGNOSIS — I4819 Other persistent atrial fibrillation: Secondary | ICD-10-CM

## 2024-10-14 DIAGNOSIS — M109 Gout, unspecified: Secondary | ICD-10-CM

## 2024-10-16 NOTE — Telephone Encounter (Signed)
 Pt last saw Glendia Ferrier, [place] on 09/13/24, last labs 01/19/24 Creat 2.10, age 63, weight 110kg, based on specified criteria pt is on appropriate dosage of Eliquis  5mg  BID for afib.  Will refill rx.

## 2024-10-17 ENCOUNTER — Ambulatory Visit (HOSPITAL_COMMUNITY): Admission: RE | Admit: 2024-10-17 | Source: Home / Self Care | Admitting: Orthopedic Surgery

## 2024-10-17 ENCOUNTER — Encounter (HOSPITAL_COMMUNITY): Admission: RE | Payer: Self-pay | Source: Home / Self Care

## 2024-10-17 DIAGNOSIS — Z01818 Encounter for other preprocedural examination: Secondary | ICD-10-CM

## 2024-10-17 SURGERY — ARTHROPLASTY, SHOULDER, TOTAL, REVERSE
Anesthesia: General | Site: Shoulder | Laterality: Right

## 2024-10-18 DIAGNOSIS — E1122 Type 2 diabetes mellitus with diabetic chronic kidney disease: Secondary | ICD-10-CM | POA: Diagnosis not present

## 2024-10-18 DIAGNOSIS — N1832 Chronic kidney disease, stage 3b: Secondary | ICD-10-CM | POA: Diagnosis not present

## 2024-10-30 ENCOUNTER — Encounter: Admitting: Surgical

## 2024-11-03 DIAGNOSIS — I129 Hypertensive chronic kidney disease with stage 1 through stage 4 chronic kidney disease, or unspecified chronic kidney disease: Secondary | ICD-10-CM | POA: Diagnosis not present

## 2024-11-03 DIAGNOSIS — N179 Acute kidney failure, unspecified: Secondary | ICD-10-CM | POA: Diagnosis not present

## 2024-11-03 DIAGNOSIS — N1832 Chronic kidney disease, stage 3b: Secondary | ICD-10-CM | POA: Diagnosis not present

## 2024-11-03 DIAGNOSIS — E1122 Type 2 diabetes mellitus with diabetic chronic kidney disease: Secondary | ICD-10-CM | POA: Diagnosis not present

## 2024-11-09 ENCOUNTER — Other Ambulatory Visit: Payer: Self-pay | Admitting: Family Medicine

## 2024-11-10 NOTE — H&P (View-Only) (Signed)
 " Cardiology Office Note:  .   Date:  11/15/2024  ID:  Jeffrey Ayala, DOB Feb 11, 1961, MRN [id number] PCP: Lendia Boby CROME, NP-C  [facility] Providers Cardiologist:  Darryle ONEIDA Decent, MD   History of Present Illness: .    Chief Complaint  Patient presents with   Follow-up    Jeffrey Ayala is a 63 y.o. male with below history who presents for follow-up.   History of Present Illness   Jeffrey Ayala is a 63 year old male with paroxysmal atrial fibrillation who presents for follow-up.  He continues to experience episodes of atrial fibrillation, confirmed by a cardio mobile device. No chest pain or trouble breathing. He is taking Eliquis  without missing any doses in the past three weeks. He underwent a cardioversion in December 2023, which was effective temporarily before the atrial fibrillation returned.  He has a history of chronic kidney disease stage 3B. He is not on dialysis. No issues with urine production.  He has obstructive sleep apnea but is not using a CPAP machine. He finds it difficult to imagine sleeping with a mask and has not attempted to use one. A previous sleep study indicated severe sleep apnea, but he did not continue with the specialist due to dissatisfaction.  He is considering shoulder replacement surgery due to pain but has not scheduled it yet. He is concerned about post-operative functionality, particularly the ability to perform daily activities without the use of his arms.           Problem List Cardiac Arrest -2/2 septic shock 02/22/2022-04/03/2022 -scrotal infection  -normal echo 2. CKD 3b/IV -on HD 2023, no off  3. LE gangrene -s/p L transmetatarsal amputation, R BKA -2/2 pressors from shock  4. Paroxysmal Afib -CHADSVASC=2 (CAD, HTN)  -DCCV 11/20/2022 5. Anemia -EGD/colonoscopy negative  6. OSA -severe with hypoxemia 70% 7. Fatty liver     ROS: All other ROS reviewed and negative. Pertinent positives noted in the HPI.      Studies Reviewed: Jeffrey Ayala   EKG Interpretation Date/Time:  Wednesday November 15 2024 08:37:40 EST Ventricular Rate:  101 PR Interval:    QRS Duration:  84 QT Interval:  354 QTC Calculation: 459 R Axis:   0  Text Interpretation: Atrial fibrillation with rapid ventricular response Confirmed by Decent Darryle [phone]) on 11/15/2024 8:54:52 AM   Physical Exam:   VS:  BP 114/70 (BP Location: Right Arm, Patient Position: Sitting, Cuff Size: Large)   Pulse (!) 101   Ht 5' 10 (1.778 m)   Wt 242 lb 1.6 oz (109.8 kg)   SpO2 99%   BMI 34.74 kg/m    Wt Readings from Last 3 Encounters:  11/15/24 242 lb 1.6 oz (109.8 kg)  09/13/24 242 lb 9.6 oz (110 kg)  08/16/24 245 lb (111.1 kg)    GEN: Well nourished, well developed in no acute distress NECK: No JVD; No carotid bruits CARDIAC: irregular rhythm, no murmurs, rubs, gallops RESPIRATORY:  Clear to auscultation without rales, wheezing or rhonchi  ABDOMEN: Soft, non-tender, non-distended EXTREMITIES:  No edema in LLE, s/p RLE amputation  ASSESSMENT AND PLAN: .   Assessment and Plan    Persistent atrial fibrillation Confirmed by home monitoring. Previous cardioversion in December 2023 was successful. Discussed potential for future ablation if AFib becomes more frequent. - Scheduled cardioversion for January 9th, 2026. - Continue Eliquis  without missing doses. - Continue metoprolol  25 mg daily. - Ordered CBC and BMET for procedure. - Will repeat  echocardiogram after cardioversion.  Obstructive sleep apnea Untreated obstructive sleep apnea, potentially contributing to AFib. Previous sleep study indicated severe sleep apnea. Discussed referral to sleep specialist for further evaluation and management options. - Referred to sleep specialist for further evaluation and management.  Essential hypertension Blood pressure is well-controlled on current medication regimen. - Continue current antihypertensive regimen.  Chronic kidney disease stage  3B CKD stage 3B with stable kidney function. - Continue monitoring kidney function.            Informed Consent   Shared Decision Making/Informed Consent The risks (stroke, cardiac arrhythmias rarely resulting in the need for a temporary or permanent pacemaker, skin irritation or burns and complications associated with conscious sedation including aspiration, arrhythmia, respiratory failure and death), benefits (restoration of normal sinus rhythm) and alternatives of a direct current cardioversion were explained in detail to Jeffrey Ayala and he agrees to proceed.        Follow-up: Return in about 6 weeks (around 12/27/2024).  Signed, Darryle DASEN. Barbaraann, MD, [facility]  [facility]  [address] [place], [place] [address] [phone]  9:06 AM   "

## 2024-11-10 NOTE — Progress Notes (Signed)
 " Cardiology Office Note:  .   Date:  11/15/2024  ID:  Jeffrey Ayala, DOB Feb 11, 1961, MRN [id number] PCP: Lendia Boby CROME, NP-C  [facility] Providers Cardiologist:  Darryle ONEIDA Decent, MD   History of Present Illness: .    Chief Complaint  Patient presents with   Follow-up    Jeffrey Ayala is a 63 y.o. male with below history who presents for follow-up.   History of Present Illness   Jeffrey Ayala is a 63 year old male with paroxysmal atrial fibrillation who presents for follow-up.  He continues to experience episodes of atrial fibrillation, confirmed by a cardio mobile device. No chest pain or trouble breathing. He is taking Eliquis  without missing any doses in the past three weeks. He underwent a cardioversion in December 2023, which was effective temporarily before the atrial fibrillation returned.  He has a history of chronic kidney disease stage 3B. He is not on dialysis. No issues with urine production.  He has obstructive sleep apnea but is not using a CPAP machine. He finds it difficult to imagine sleeping with a mask and has not attempted to use one. A previous sleep study indicated severe sleep apnea, but he did not continue with the specialist due to dissatisfaction.  He is considering shoulder replacement surgery due to pain but has not scheduled it yet. He is concerned about post-operative functionality, particularly the ability to perform daily activities without the use of his arms.           Problem List Cardiac Arrest -2/2 septic shock 02/22/2022-04/03/2022 -scrotal infection  -normal echo 2. CKD 3b/IV -on HD 2023, no off  3. LE gangrene -s/p L transmetatarsal amputation, R BKA -2/2 pressors from shock  4. Paroxysmal Afib -CHADSVASC=2 (CAD, HTN)  -DCCV 11/20/2022 5. Anemia -EGD/colonoscopy negative  6. OSA -severe with hypoxemia 70% 7. Fatty liver     ROS: All other ROS reviewed and negative. Pertinent positives noted in the HPI.      Studies Reviewed: SABRA   EKG Interpretation Date/Time:  Wednesday November 15 2024 08:37:40 EST Ventricular Rate:  101 PR Interval:    QRS Duration:  84 QT Interval:  354 QTC Calculation: 459 R Axis:   0  Text Interpretation: Atrial fibrillation with rapid ventricular response Confirmed by Decent Darryle [phone]) on 11/15/2024 8:54:52 AM   Physical Exam:   VS:  BP 114/70 (BP Location: Right Arm, Patient Position: Sitting, Cuff Size: Large)   Pulse (!) 101   Ht 5' 10 (1.778 m)   Wt 242 lb 1.6 oz (109.8 kg)   SpO2 99%   BMI 34.74 kg/m    Wt Readings from Last 3 Encounters:  11/15/24 242 lb 1.6 oz (109.8 kg)  09/13/24 242 lb 9.6 oz (110 kg)  08/16/24 245 lb (111.1 kg)    GEN: Well nourished, well developed in no acute distress NECK: No JVD; No carotid bruits CARDIAC: irregular rhythm, no murmurs, rubs, gallops RESPIRATORY:  Clear to auscultation without rales, wheezing or rhonchi  ABDOMEN: Soft, non-tender, non-distended EXTREMITIES:  No edema in LLE, s/p RLE amputation  ASSESSMENT AND PLAN: .   Assessment and Plan    Persistent atrial fibrillation Confirmed by home monitoring. Previous cardioversion in December 2023 was successful. Discussed potential for future ablation if AFib becomes more frequent. - Scheduled cardioversion for January 9th, 2026. - Continue Eliquis  without missing doses. - Continue metoprolol  25 mg daily. - Ordered CBC and BMET for procedure. - Will repeat  echocardiogram after cardioversion.  Obstructive sleep apnea Untreated obstructive sleep apnea, potentially contributing to AFib. Previous sleep study indicated severe sleep apnea. Discussed referral to sleep specialist for further evaluation and management options. - Referred to sleep specialist for further evaluation and management.  Essential hypertension Blood pressure is well-controlled on current medication regimen. - Continue current antihypertensive regimen.  Chronic kidney disease stage  3B CKD stage 3B with stable kidney function. - Continue monitoring kidney function.            Informed Consent   Shared Decision Making/Informed Consent The risks (stroke, cardiac arrhythmias rarely resulting in the need for a temporary or permanent pacemaker, skin irritation or burns and complications associated with conscious sedation including aspiration, arrhythmia, respiratory failure and death), benefits (restoration of normal sinus rhythm) and alternatives of a direct current cardioversion were explained in detail to Jeffrey Ayala and he agrees to proceed.        Follow-up: Return in about 6 weeks (around 12/27/2024).  Signed, Darryle DASEN. Barbaraann, MD, [facility]  [facility]  [address] [place], [place] [address] [phone]  9:06 AM   "

## 2024-11-15 ENCOUNTER — Ambulatory Visit: Admitting: Cardiovascular Disease

## 2024-11-15 ENCOUNTER — Encounter: Payer: Self-pay | Admitting: Cardiovascular Disease

## 2024-11-15 VITALS — BP 114/70 | HR 101 | Ht 70.0 in | Wt 242.1 lb

## 2024-11-15 DIAGNOSIS — I10 Essential (primary) hypertension: Secondary | ICD-10-CM | POA: Diagnosis not present

## 2024-11-15 DIAGNOSIS — G4733 Obstructive sleep apnea (adult) (pediatric): Secondary | ICD-10-CM

## 2024-11-15 DIAGNOSIS — I4819 Other persistent atrial fibrillation: Secondary | ICD-10-CM

## 2024-11-15 DIAGNOSIS — N184 Chronic kidney disease, stage 4 (severe): Secondary | ICD-10-CM | POA: Diagnosis not present

## 2024-11-15 LAB — CBC
Hematocrit: 46 % (ref 37.5–51.0)
Hemoglobin: 14.7 g/dL (ref 13.0–17.7)
MCH: 31.2 pg (ref 26.6–33.0)
MCHC: 32 g/dL (ref 31.5–35.7)
MCV: 98 fL — ABNORMAL HIGH (ref 79–97)
Platelets: 205 x10E3/uL (ref 150–450)
RBC: 4.71 x10E6/uL (ref 4.14–5.80)
RDW: 15.9 % — ABNORMAL HIGH (ref 11.6–15.4)
WBC: 7.6 x10E3/uL (ref 3.4–10.8)

## 2024-11-15 LAB — BASIC METABOLIC PANEL WITH GFR
BUN/Creatinine Ratio: 10 (ref 10–24)
BUN: 19 mg/dL (ref 8–27)
CO2: 24 mmol/L (ref 20–29)
Calcium: 9.4 mg/dL (ref 8.6–10.2)
Chloride: 101 mmol/L (ref 96–106)
Creatinine, Ser: 1.96 mg/dL — ABNORMAL HIGH (ref 0.76–1.27)
Glucose: 112 mg/dL — ABNORMAL HIGH (ref 70–99)
Potassium: 4.2 mmol/L (ref 3.5–5.2)
Sodium: 141 mmol/L (ref 134–144)
eGFR: 38 mL/min/1.73 — ABNORMAL LOW

## 2024-11-15 NOTE — Patient Instructions (Signed)
 Medication Instructions:  Your physician recommends that you continue on your current medications as directed. Please refer to the Current Medication list given to you today.  *If you need a refill on your cardiac medications before your next appointment, please call your pharmacy*  Lab Work: Today- BMET & CBC  If you have labs (blood work) drawn today and your tests are completely normal, you will receive your results only by: MyChart Message (if you have MyChart) OR A paper copy in the mail If you have any lab test that is abnormal or we need to change your treatment, we will call you to review the results.  Testing/Procedures: See below  Follow-Up: At [facility], you and your health needs are our priority.  As part of our continuing mission to provide you with exceptional heart care, our providers are all part of one team.  This team includes your primary Cardiologist (physician) and Advanced Practice Providers or APPs (Physician Assistants and Nurse Practitioners) who all work together to provide you with the care you need, when you need it.  Your next appointment:   6-8 week(s)  Provider:   Darryle ONEIDA Decent, MD    We recommend signing up for the patient portal called MyChart.  Sign up information is provided on this After Visit Summary.  MyChart is used to connect with patients for Virtual Visits (Telemedicine).  Patients are able to view lab/test results, encounter notes, upcoming appointments, etc.  Non-urgent messages can be sent to your provider as well.   To learn more about what you can do with MyChart, go to [email or web address].   Other Instructions     You are scheduled for a Cardioversion on Friday, January 9 with Dr. Raford.  Please arrive at the [facility] (Main Entrance A) at [facility]: [address] [place], [place] [address] at 7:00 AM (This time is 1 hour(s) before your procedure to ensure your preparation).   Free valet parking  service is available. You will check in at ADMITTING.   *Please Note: You will receive a call the day before your procedure to confirm the appointment time. That time may have changed from the original time based on the schedule for that day.*   DIET:  Nothing to eat or drink after midnight except a sip of water  with medications (see medication instructions below)  MEDICATION INSTRUCTIONS: !!IF ANY NEW MEDICATIONS ARE STARTED AFTER TODAY, PLEASE NOTIFY YOUR PROVIDER AS SOON AS POSSIBLE!!  FYI: Medications such as Semaglutide (Ozempic, Wegovy), Tirzepatide (Mounjaro, Zepbound), Dulaglutide (Trulicity), etc (GLP1 agonists) AND Canagliflozin (Invokana), Dapagliflozin (Farxiga), Empagliflozin (Jardiance), Ertugliflozin (Steglatro), Bexagliflozin [employer]) or any combination with one of these drugs such as Invokamet (Canagliflozin/Metformin), Synjardy (Empagliflozin/Metformin), etc (SGLT2 inhibitors) must be held around the time of a procedure. This is not a comprehensive list of all of these drugs. Please review all of your medications and talk to your provider if you take any one of these. If you are not sure, ask your provider.     Continue taking your anticoagulant (blood thinner): Apixaban  (Eliquis ).  You will need to continue this after your procedure until you are told by your provider that it is safe to stop.    -Hold furosemide  (lasix ) on the day of your procedure.  LABS: Done on 12/24.   FYI:  For your safety, and to allow us  to monitor your vital signs accurately during the surgery/procedure we request: If you have artificial nails, gel coating, SNS etc, please have those  removed prior to your surgery/procedure. Not having the nail coverings /polish removed may result in cancellation or delay of your surgery/procedure.  Your support person will be asked to wait in the waiting room during your procedure.  It is OK to have someone drop you off and come back when you are ready to be  discharged.  You cannot drive after the procedure and will need someone to drive you home.  Bring your insurance cards.  *Special Note: Every effort is made to have your procedure done on time. Occasionally there are emergencies that occur at the hospital that may cause delays. Please be patient if a delay does occur.

## 2024-11-17 ENCOUNTER — Ambulatory Visit: Payer: Self-pay | Admitting: Cardiovascular Disease

## 2024-11-24 ENCOUNTER — Telehealth: Payer: Self-pay | Admitting: Gastroenterology

## 2024-11-24 MED ORDER — PANTOPRAZOLE SODIUM 40 MG PO TBEC: 40.0000 mg | DELAYED_RELEASE_TABLET | Freq: Two times a day (BID) | ORAL | 2 refills | Status: DC

## 2024-11-24 NOTE — Telephone Encounter (Signed)
 Patient requesting refill for pantoprazole . Has been scheduled for 2/3. Please advise, thank you

## 2024-11-24 NOTE — Telephone Encounter (Signed)
 Pantoprazole  refilled.

## 2024-12-01 ENCOUNTER — Ambulatory Visit (HOSPITAL_COMMUNITY): Admitting: Anesthesiology

## 2024-12-01 ENCOUNTER — Encounter (HOSPITAL_COMMUNITY): Payer: Self-pay | Admitting: Cardiovascular Disease

## 2024-12-01 ENCOUNTER — Ambulatory Visit (HOSPITAL_COMMUNITY)
Admission: RE | Admit: 2024-12-01 | Discharge: 2024-12-01 | Disposition: A | Discharge status: Home / Self Care | Attending: Cardiovascular Disease | Admitting: Cardiovascular Disease

## 2024-12-01 ENCOUNTER — Other Ambulatory Visit: Payer: Self-pay

## 2024-12-01 ENCOUNTER — Encounter (HOSPITAL_COMMUNITY)
Admission: RE | Disposition: A | Discharge status: Home / Self Care | Payer: Self-pay | Source: Home / Self Care | Attending: Cardiovascular Disease

## 2024-12-01 DIAGNOSIS — I4819 Other persistent atrial fibrillation: Secondary | ICD-10-CM | POA: Diagnosis present

## 2024-12-01 DIAGNOSIS — I129 Hypertensive chronic kidney disease with stage 1 through stage 4 chronic kidney disease, or unspecified chronic kidney disease: Secondary | ICD-10-CM | POA: Insufficient documentation

## 2024-12-01 DIAGNOSIS — Z79899 Other long term (current) drug therapy: Secondary | ICD-10-CM | POA: Insufficient documentation

## 2024-12-01 DIAGNOSIS — G4733 Obstructive sleep apnea (adult) (pediatric): Secondary | ICD-10-CM | POA: Insufficient documentation

## 2024-12-01 DIAGNOSIS — I252 Old myocardial infarction: Secondary | ICD-10-CM | POA: Insufficient documentation

## 2024-12-01 DIAGNOSIS — N1832 Chronic kidney disease, stage 3b: Secondary | ICD-10-CM | POA: Diagnosis not present

## 2024-12-01 DIAGNOSIS — Z7901 Long term (current) use of anticoagulants: Secondary | ICD-10-CM | POA: Insufficient documentation

## 2024-12-01 DIAGNOSIS — I4891 Unspecified atrial fibrillation: Secondary | ICD-10-CM

## 2024-12-01 HISTORY — PX: CARDIOVERSION: EP1203

## 2024-12-01 MED ORDER — LIDOCAINE 2% (20 MG/ML) 5 ML SYRINGE
INTRAMUSCULAR | Status: DC | PRN
  Administered 2024-12-01: 50 mg via INTRAVENOUS

## 2024-12-01 MED ORDER — SODIUM CHLORIDE 0.9 % IV SOLN: INTRAVENOUS | Status: DC

## 2024-12-01 MED ORDER — PROPOFOL 10 MG/ML IV BOLUS
INTRAVENOUS | Status: DC | PRN
  Administered 2024-12-01: 50 mg via INTRAVENOUS

## 2024-12-01 NOTE — Anesthesia Preprocedure Evaluation (Addendum)
"                                    Anesthesia Evaluation  Patient identified by MRN, date of birth, ID band Patient awake    Reviewed: Allergy & Precautions, NPO status , Patient's Chart, lab work & pertinent test results, reviewed documented beta blocker date and time   History of Anesthesia Complications Negative for: history of anesthetic complications  Airway Mallampati: III  TM Distance: >3 FB Neck ROM: Full    Dental  (+) Dental Advisory Given, Missing, Chipped, Poor Dentition   Pulmonary sleep apnea    Pulmonary exam normal        Cardiovascular hypertension, Pt. on home beta blockers and Pt. on medications + Past MI  Normal cardiovascular exam+ dysrhythmias Atrial Fibrillation   Cardiac arrest 02/2022 - from septic shock  '23 TTE - EF 60 to 65%. There is mild left ventricular hypertrophy. No valvulopathy.     Neuro/Psych  Headaches, neg Seizures PSYCHIATRIC DISORDERS  Depression       GI/Hepatic PUD,GERD  Medicated and Controlled,,(+) Cirrhosis         Endo/Other  diabetesHypothyroidism    Renal/GU CRFRenal disease     Musculoskeletal  (+) Arthritis ,    Abdominal   Peds  Hematology  (+) Blood dyscrasia, anemia  On eliquis     Anesthesia Other Findings   Reproductive/Obstetrics                              Anesthesia Physical Anesthesia Plan  ASA: 4  Anesthesia Plan: General   Post-op Pain Management:    Induction: Intravenous  PONV Risk Score and Plan: 2 and Treatment may vary due to age or medical condition and Propofol  infusion  Airway Management Planned: Natural Airway and Mask  Additional Equipment: None  Intra-op Plan:   Post-operative Plan:   Informed Consent: I have reviewed the patients History and Physical, chart, labs and discussed the procedure including the risks, benefits and alternatives for the proposed anesthesia with the patient or authorized representative who has indicated  his/her understanding and acceptance.       Plan Discussed with: CRNA and Anesthesiologist  Anesthesia Plan Comments:          Anesthesia Quick Evaluation  "

## 2024-12-01 NOTE — Interval H&P Note (Signed)
 History and Physical Interval Note:  12/01/2024 8:32 AM  Jeffrey Ayala  has presented today for surgery, with the diagnosis of AFIB.  The various methods of treatment have been discussed with the patient and family. After consideration of risks, benefits and other options for treatment, the patient has consented to  Procedures: CARDIOVERSION (N/A) as a surgical intervention.  The patient's history has been reviewed, patient examined, no change in status, stable for surgery.  I have reviewed the patient's chart and labs.  Questions were answered to the patient's satisfaction.     [name], MD

## 2024-12-01 NOTE — Transfer of Care (Signed)
 Immediate Anesthesia Transfer of Care Note  Patient: Jeffrey Ayala  Procedure(s) Performed: CARDIOVERSION  Patient Location: PACU and Cath Lab  Anesthesia Type:General  Level of Consciousness: drowsy  Airway & Oxygen Therapy: Patient Spontanous Breathing and Patient connected to nasal cannula oxygen  Post-op Assessment: Report given to RN and Post -op Vital signs reviewed and stable  Post vital signs: Reviewed and stable  Last Vitals:  Vitals Value Taken Time  BP 127/72   Temp    Pulse 75   Resp 18   SpO2 99     Last Pain:  Vitals:   12/01/24 0710  TempSrc: Temporal         Complications: No notable events documented.

## 2024-12-01 NOTE — Anesthesia Postprocedure Evaluation (Signed)
"   Anesthesia Post Note  Patient: Jeffrey Ayala  Procedure(s) Performed: CARDIOVERSION     Patient location during evaluation: PACU Anesthesia Type: General Level of consciousness: awake and alert Pain management: pain level controlled Vital Signs Assessment: post-procedure vital signs reviewed and stable Respiratory status: spontaneous breathing, nonlabored ventilation, respiratory function stable and patient connected to nasal cannula oxygen Cardiovascular status: blood pressure returned to baseline and stable Postop Assessment: no apparent nausea or vomiting Anesthetic complications: no   No notable events documented.  Last Vitals:  Vitals:   12/01/24 0934 12/01/24 0944  BP: 108/73 110/73  Pulse: 77 77  Resp: 16 13  Temp:    SpO2: 97% 98%    Last Pain:  Vitals:   12/01/24 0944  TempSrc:   PainSc: 0-No pain                 Thom [name]      "

## 2024-12-01 NOTE — CV Procedure (Signed)
 Electrical Cardioversion Procedure Note Jeffrey Ayala [id number] 1961-04-12  Procedure: Electrical Cardioversion Indications:  Atrial Fibrillation  Procedure Details Consent: Risks of procedure as well as the alternatives and risks of each were explained to the (patient/caregiver).  Consent for procedure obtained. Time Out: Verified patient identification, verified procedure, site/side was marked, verified correct patient position, special equipment/implants available, medications/allergies/relevent history reviewed, required imaging and test results available.  Performed  Patient placed on cardiac monitor, pulse oximetry, supplemental oxygen as necessary.  Sedation given: propofol  Pacer pads placed anterior and posterior chest.  Cardioverted 2 time(s).  Cardioverted at 200J unsuccessful.  300J successful.  Evaluation Findings: Post procedure EKG shows: sinus arrhythmia Complications: None Patient did tolerate procedure well.   Jeffrey Scarce, MD 12/01/2024, 9:22 AM

## 2024-12-01 NOTE — Discharge Instructions (Signed)

## 2024-12-02 ENCOUNTER — Encounter (HOSPITAL_COMMUNITY): Payer: Self-pay | Admitting: Cardiovascular Disease

## 2024-12-08 ENCOUNTER — Other Ambulatory Visit: Payer: Self-pay | Admitting: Nurse Practitioner

## 2024-12-08 DIAGNOSIS — K7581 Nonalcoholic steatohepatitis (NASH): Secondary | ICD-10-CM

## 2024-12-08 DIAGNOSIS — K7469 Other cirrhosis of liver: Secondary | ICD-10-CM

## 2024-12-10 ENCOUNTER — Other Ambulatory Visit: Payer: Self-pay | Admitting: Orthopedic Surgery

## 2024-12-10 ENCOUNTER — Other Ambulatory Visit: Payer: Self-pay | Admitting: Family Medicine

## 2024-12-19 ENCOUNTER — Telehealth: Payer: Self-pay | Admitting: Cardiovascular Disease

## 2024-12-19 NOTE — Telephone Encounter (Signed)
 Pt c/o medication issue:  1. Name of Medication:   ELIQUIS  5 MG TABS tablet    2. How are you currently taking this medication (dosage and times per day)? As written  3. Are you having a reaction (difficulty breathing--STAT)? no  4. What is your medication issue? Pt went to pick up medication and Pharmacy said it was $100. Pt states he cannot afford that. Please advise

## 2024-12-20 ENCOUNTER — Telehealth: Payer: Self-pay | Admitting: Pharmacy Technician

## 2024-12-20 ENCOUNTER — Other Ambulatory Visit (HOSPITAL_COMMUNITY): Payer: Self-pay

## 2024-12-20 NOTE — Telephone Encounter (Addendum)
" ° °  Got the patient a coupon    Now 10.00   I called the patient and lmom for the patient and lmom for the wife  "

## 2024-12-20 NOTE — Telephone Encounter (Signed)
Wife called back and is aware.

## 2024-12-20 NOTE — Telephone Encounter (Signed)
 Now 10.00    I called the patient and lmom for the patient and lmom for the wife

## 2024-12-22 ENCOUNTER — Other Ambulatory Visit

## 2024-12-22 ENCOUNTER — Encounter: Payer: Self-pay | Admitting: *Deleted

## 2024-12-22 NOTE — Progress Notes (Unsigned)
" °  Cardiology Office Note:  .   Date:  12/22/2024  ID:  Jeffrey Ayala, DOB Oct 23, 1961, MRN [id number] PCP: Lendia Boby CROME, NP-C  [facility] Providers Cardiologist:  Darryle ONEIDA Decent, MD   History of Present Illness: .   No chief complaint on file.   Jeffrey Ayala is a 64 y.o. male with below history who presents for follow-up.   History of Present Illness              *** Problem List Cardiac Arrest -2/2 septic shock 02/22/2022-04/03/2022 -scrotal infection  -normal echo 2. CKD 3b/IV -on HD 2023, no off  3. LE gangrene -s/p L transmetatarsal amputation, R BKA -2/2 pressors from shock  4. Paroxysmal Afib -CHADSVASC=2 (CAD, HTN)  -DCCV 11/20/2022 -DCCV 12/01/2024 5. Anemia -EGD/colonoscopy negative  6. OSA -severe with hypoxemia 70% 7. Fatty liver     ROS: All other ROS reviewed and negative. Pertinent positives noted in the HPI.     Studies Reviewed: SABRA       TTE 02/23/2022  1. Left ventricular ejection fraction, by estimation, is 60 to 65%. The  left ventricle has normal function. The left ventricle has no regional  wall motion abnormalities. There is mild left ventricular hypertrophy.  Left ventricular diastolic function  could not be evaluated.   2. Right ventricular systolic function is normal. The right ventricular  size is normal.   3. The mitral valve is normal in structure. No evidence of mitral valve  regurgitation. No evidence of mitral stenosis.   4. The aortic valve is tricuspid. Aortic valve regurgitation is not  visualized. No aortic stenosis is present.   5. The inferior vena cava is dilated in size with <50% respiratory  variability, suggesting right atrial pressure of 15 mmHg.   Physical Exam:   VS:  There were no vitals taken for this visit.   Wt Readings from Last 3 Encounters:  12/01/24 240 lb (108.9 kg)  11/15/24 242 lb 1.6 oz (109.8 kg)  09/13/24 242 lb 9.6 oz (110 kg)    GEN: Well nourished, well developed in no acute  distress NECK: No JVD; No carotid bruits CARDIAC: ***RRR, no murmurs, rubs, gallops RESPIRATORY:  Clear to auscultation without rales, wheezing or rhonchi  ABDOMEN: Soft, non-tender, non-distended EXTREMITIES:  No edema; No deformity  ASSESSMENT AND PLAN: .   Assessment and Plan                 {Are you ordering a CV Procedure (e.g. stress test, cath, DCCV, TEE, etc)?   Press F2        :[id number]}   Follow-up: No follow-ups on file.  Signed, Darryle ONEIDA. Decent, MD, [facility]  [facility]  [address] [place], [place] [address] [phone]  1:35 PM   "

## 2024-12-22 NOTE — Progress Notes (Signed)
 SAJID RUPPERT                                          MRN: [id number]   12/22/2024   The [facility] Quality Team Specialist reviewed this patient medical record for the purposes of chart review for care gap closure. The following were reviewed: chart review for care gap closure-glycemic status assessment.    VBCI Quality Team

## 2024-12-25 ENCOUNTER — Ambulatory Visit: Admitting: Cardiovascular Disease

## 2024-12-25 DIAGNOSIS — I10 Essential (primary) hypertension: Secondary | ICD-10-CM

## 2024-12-25 DIAGNOSIS — I4819 Other persistent atrial fibrillation: Secondary | ICD-10-CM

## 2024-12-26 ENCOUNTER — Ambulatory Visit: Admitting: Gastroenterology

## 2024-12-26 ENCOUNTER — Encounter: Payer: Self-pay | Admitting: Gastroenterology

## 2024-12-26 VITALS — BP 122/70 | HR 62 | Ht 70.0 in | Wt 239.0 lb

## 2024-12-26 DIAGNOSIS — K7581 Nonalcoholic steatohepatitis (NASH): Secondary | ICD-10-CM

## 2024-12-26 DIAGNOSIS — Z860101 Personal history of adenomatous and serrated colon polyps: Secondary | ICD-10-CM

## 2024-12-26 DIAGNOSIS — Z8719 Personal history of other diseases of the digestive system: Secondary | ICD-10-CM

## 2024-12-26 DIAGNOSIS — K219 Gastro-esophageal reflux disease without esophagitis: Secondary | ICD-10-CM | POA: Diagnosis not present

## 2024-12-26 DIAGNOSIS — K7460 Unspecified cirrhosis of liver: Secondary | ICD-10-CM | POA: Diagnosis not present

## 2024-12-26 DIAGNOSIS — K200 Eosinophilic esophagitis: Secondary | ICD-10-CM

## 2024-12-26 DIAGNOSIS — I4891 Unspecified atrial fibrillation: Secondary | ICD-10-CM

## 2024-12-26 DIAGNOSIS — N183 Chronic kidney disease, stage 3 unspecified: Secondary | ICD-10-CM | POA: Diagnosis not present

## 2024-12-26 DIAGNOSIS — Z89511 Acquired absence of right leg below knee: Secondary | ICD-10-CM

## 2024-12-26 DIAGNOSIS — Z860100 Personal history of colon polyps, unspecified: Secondary | ICD-10-CM

## 2024-12-26 MED ORDER — PANTOPRAZOLE SODIUM 40 MG PO TBEC: 40.0000 mg | DELAYED_RELEASE_TABLET | Freq: Two times a day (BID) | ORAL | 1 refills | Status: AC

## 2024-12-26 NOTE — Patient Instructions (Addendum)
 We have sent the following medications to your pharmacy for you to pick up at your convenience: Pantoprazole    Please follow up in October.  Thank you for trusting me with your gastrointestinal care!   Nestor Blower, PA  _______________________________________________________  If your blood pressure at your visit was 140/90 or greater, please contact your primary care physician to follow up on this.  _______________________________________________________  If you are age 64 or older, your body mass index should be between 23-30. Your Body mass index is 34.29 kg/m. If this is out of the aforementioned range listed, please consider follow up with your Primary Care Provider.  If you are age 64 or younger, your body mass index should be between 19-25. Your Body mass index is 34.29 kg/m. If this is out of the aformentioned range listed, please consider follow up with your Primary Care Provider.   ________________________________________________________  The [facility] providers would like to encourage you to use MYCHART to communicate with providers for non-urgent requests or questions.  Due to long hold times on the telephone, sending your provider a message by [facility] may be a faster and more efficient way to get a response.  Please allow 48 business hours for a response.  Please remember that this is for non-urgent requests.  _______________________________________________________  [facility] is using a team-based approach to care.  Your team is made up of your doctor and two to three APPS. Our APPS (Nurse Practitioners and Physician Assistants) work with your physician to ensure care continuity for you. They are fully qualified to address your health concerns and develop a treatment plan. They communicate directly with your gastroenterologist to care for you. Seeing the Advanced Practice Practitioners on your physician's team can help you by facilitating care more promptly,  often allowing for earlier appointments, access to diagnostic testing, procedures, and other specialty referrals.    Due to recent changes in healthcare laws, you may see the results of your imaging and laboratory studies on MyChart before your provider has had a chance to review them.  We understand that in some cases there may be results that are confusing or concerning to you. Not all laboratory results come back in the same time frame and the provider may be waiting for multiple results in order to interpret others.  Please give us  48 hours in order for your provider to thoroughly review all the results before contacting the office for clarification of your results.

## 2024-12-26 NOTE — Progress Notes (Signed)
 Agree with assessment and plan as outlined.

## 2024-12-26 NOTE — Progress Notes (Unsigned)
 " Cardiology Office Note:  .   Date:  12/27/2024  ID:  Jeffrey Ayala, DOB 1961-08-29, MRN [id number] PCP: Lendia Boby CROME, NP-C  [facility] Providers Cardiologist:  Darryle ONEIDA Decent, MD   History of Present Illness: .    Chief Complaint  Patient presents with   Follow-up         Jeffrey Ayala is a 64 y.o. male with below history who presents for follow-up.   History of Present Illness   Jeffrey Ayala is a 64 year old male with persistent atrial fibrillation who presents for follow-up after cardioversion.  He has a history of persistent atrial fibrillation and recently underwent cardioversion. He cannot discern when he is in or out of atrial fibrillation, stating 'I stay out more than I stand.' He was in atrial fibrillation for 34 months previously and feels that once he goes into atrial fibrillation, he remains in it. Since the last cardioversion, he has not noticed any atrial fibrillation on his monitor.  He is currently taking Eliquis  5 mg twice daily and metoprolol  succinate 25 mg daily. No chest pain or trouble breathing is reported, and he cannot feel when he is in atrial fibrillation.  He has a history of sleep apnea and has been unable to tolerate the CPAP mask. He recalls being diagnosed with sleep apnea several years ago by a doctor who has since retired. He mentions difficulty with weight loss and dietary changes.           Problem List Cardiac Arrest -2/2 septic shock 02/22/2022-04/03/2022 -scrotal infection  -normal echo 2. CKD 3b/IV -on HD 2023, no off  3. LE gangrene -s/p L transmetatarsal amputation, R BKA -2/2 pressors from shock  4. Paroxysmal Afib -CHADSVASC=2 (CAD, HTN)  -DCCV 11/20/2022 -DCCV 12/01/2024 5. Anemia -EGD/colonoscopy negative  6. OSA -severe with hypoxemia 70% 7. Fatty liver     ROS: All other ROS reviewed and negative. Pertinent positives noted in the HPI.     Studies Reviewed: SABRA   EKG  Interpretation Date/Time:  Wednesday December 27 2024 15:36:08 EST Ventricular Rate:  80 PR Interval:  144 QRS Duration:  78 QT Interval:  382 QTC Calculation: 440 R Axis:   6  Text Interpretation: Normal sinus rhythm Low voltage QRS Nonspecific ST abnormality Confirmed by Decent Darryle [phone]) on 12/27/2024 3:43:17 PM   TTE 02/23/2022  1. Left ventricular ejection fraction, by estimation, is 60 to 65%. The  left ventricle has normal function. The left ventricle has no regional  wall motion abnormalities. There is mild left ventricular hypertrophy.  Left ventricular diastolic function  could not be evaluated.   2. Right ventricular systolic function is normal. The right ventricular  size is normal.   3. The mitral valve is normal in structure. No evidence of mitral valve  regurgitation. No evidence of mitral stenosis.   4. The aortic valve is tricuspid. Aortic valve regurgitation is not  visualized. No aortic stenosis is present.   5. The inferior vena cava is dilated in size with <50% respiratory  variability, suggesting right atrial pressure of 15 mmHg.   Physical Exam:   VS:  BP 110/60 (BP Location: Left Arm, Patient Position: Sitting, Cuff Size: Normal)   Pulse 82   Ht 5' 10 (1.778 m)   SpO2 98%   BMI 34.29 kg/m    Wt Readings from Last 3 Encounters:  12/26/24 239 lb (108.4 kg)  12/01/24 240 lb (108.9 kg)  11/15/24 242  lb 1.6 oz (109.8 kg)    GEN: Well nourished, well developed in no acute distress NECK: No JVD; No carotid bruits CARDIAC: RRR, no murmurs, rubs, gallops RESPIRATORY:  Clear to auscultation without rales, wheezing or rhonchi  ABDOMEN: Soft, non-tender, non-distended EXTREMITIES:  No edema; No deformity  ASSESSMENT AND PLAN: .   Assessment and Plan    Persistent atrial fibrillation Persistent AFib with previous cardioversion, currently in rhythm. Asymptomatic. Discussed ablation procedure, risks, benefits, and comparison with metoprolol  management. Good  candidate for ablation. Emphasized addressing triggers like sleep apnea. - Continue Eliquis  5 mg BID. - Continue metoprolol  succinate 25 mg daily. - Referred to EP team for evaluation and discussion of ablation procedure.  Obstructive sleep apnea Severe OSA with hypoxemia, unable to tolerate CPAP. Discussed impact on AFib and alternative treatments like Inspire device. Emphasized importance of addressing OSA as an AFib trigger. - Referred to pulmonary specialist for evaluation and discussion of alternative treatments such as Inspire device.  Chronic kidney disease stage 4 -stable.                Follow-up: Return in about 6 months (around 06/26/2025).  Signed, Darryle DASEN. Barbaraann, MD, [facility]  [facility]  [address] [place], [place] [address] [phone]  3:59 PM   "

## 2024-12-27 ENCOUNTER — Encounter: Payer: Self-pay | Admitting: Cardiovascular Disease

## 2024-12-27 ENCOUNTER — Ambulatory Visit: Admitting: Cardiovascular Disease

## 2024-12-27 VITALS — BP 110/60 | HR 82 | Ht 70.0 in

## 2024-12-27 DIAGNOSIS — N184 Chronic kidney disease, stage 4 (severe): Secondary | ICD-10-CM

## 2024-12-27 DIAGNOSIS — G4733 Obstructive sleep apnea (adult) (pediatric): Secondary | ICD-10-CM

## 2024-12-27 DIAGNOSIS — I4819 Other persistent atrial fibrillation: Secondary | ICD-10-CM | POA: Diagnosis not present

## 2024-12-27 DIAGNOSIS — I10 Essential (primary) hypertension: Secondary | ICD-10-CM | POA: Diagnosis not present

## 2024-12-27 NOTE — Patient Instructions (Signed)
 Medication Instructions:  Your physician recommends that you continue on your current medications as directed. Please refer to the Current Medication list given to you today.  *If you need a refill on your cardiac medications before your next appointment, please call your pharmacy*  Lab Work: None  If you have labs (blood work) drawn today and your tests are completely normal, you will receive your results only by: MyChart Message (if you have MyChart) OR A paper copy in the mail If you have any lab test that is abnormal or we need to change your treatment, we will call you to review the results.  Testing/Procedures: none  Follow-Up: At [facility], you and your health needs are our priority.  As part of our continuing mission to provide you with exceptional heart care, our providers are all part of one team.  This team includes your primary Cardiologist (physician) and Advanced Practice Providers or APPs (Physician Assistants and Nurse Practitioners) who all work together to provide you with the care you need, when you need it.  Your next appointment:   6 month(s)  Provider:   Darryle ONEIDA Decent, MD    We recommend signing up for the patient portal called MyChart.  Sign up information is provided on this After Visit Summary.  MyChart is used to connect with patients for Virtual Visits (Telemedicine).  Patients are able to view lab/test results, encounter notes, upcoming appointments, etc.  Non-urgent messages can be sent to your provider as well.   To learn more about what you can do with MyChart, go to [email or web address].   Other Instructions We are referring you to Electrophysiology and Pulmonology.

## 2024-12-29 ENCOUNTER — Ambulatory Visit: Admitting: Cardiovascular Disease

## 2025-01-10 ENCOUNTER — Ambulatory Visit (HOSPITAL_BASED_OUTPATIENT_CLINIC_OR_DEPARTMENT_OTHER)
